# Patient Record
Sex: Female | Born: 1945 | Race: White | Hispanic: No | Marital: Married | Smoking: Former smoker
Health system: Southern US, Community
[De-identification: ages and names within clinical notes are randomized; demographics above are authoritative.]

## PROBLEM LIST (undated history)

## (undated) DIAGNOSIS — R519 Headache, unspecified: Secondary | ICD-10-CM

## (undated) DIAGNOSIS — C801 Malignant (primary) neoplasm, unspecified: Secondary | ICD-10-CM

## (undated) DIAGNOSIS — K219 Gastro-esophageal reflux disease without esophagitis: Secondary | ICD-10-CM

## (undated) DIAGNOSIS — G629 Polyneuropathy, unspecified: Secondary | ICD-10-CM

## (undated) DIAGNOSIS — J45909 Unspecified asthma, uncomplicated: Secondary | ICD-10-CM

## (undated) DIAGNOSIS — G4730 Sleep apnea, unspecified: Secondary | ICD-10-CM

## (undated) DIAGNOSIS — E1140 Type 2 diabetes mellitus with diabetic neuropathy, unspecified: Secondary | ICD-10-CM

## (undated) DIAGNOSIS — M545 Low back pain, unspecified: Secondary | ICD-10-CM

## (undated) DIAGNOSIS — J449 Chronic obstructive pulmonary disease, unspecified: Secondary | ICD-10-CM

## (undated) DIAGNOSIS — R51 Headache: Secondary | ICD-10-CM

## (undated) DIAGNOSIS — I4891 Unspecified atrial fibrillation: Secondary | ICD-10-CM

## (undated) DIAGNOSIS — R0600 Dyspnea, unspecified: Secondary | ICD-10-CM

## (undated) DIAGNOSIS — E119 Type 2 diabetes mellitus without complications: Secondary | ICD-10-CM

## (undated) DIAGNOSIS — J189 Pneumonia, unspecified organism: Secondary | ICD-10-CM

## (undated) DIAGNOSIS — K429 Umbilical hernia without obstruction or gangrene: Secondary | ICD-10-CM

## (undated) DIAGNOSIS — C259 Malignant neoplasm of pancreas, unspecified: Secondary | ICD-10-CM

## (undated) DIAGNOSIS — R569 Unspecified convulsions: Secondary | ICD-10-CM

## (undated) DIAGNOSIS — I10 Essential (primary) hypertension: Secondary | ICD-10-CM

## (undated) DIAGNOSIS — F32A Depression, unspecified: Secondary | ICD-10-CM

## (undated) DIAGNOSIS — E7800 Pure hypercholesterolemia, unspecified: Secondary | ICD-10-CM

## (undated) DIAGNOSIS — G8929 Other chronic pain: Secondary | ICD-10-CM

## (undated) DIAGNOSIS — K8689 Other specified diseases of pancreas: Secondary | ICD-10-CM

## (undated) DIAGNOSIS — F329 Major depressive disorder, single episode, unspecified: Secondary | ICD-10-CM

## (undated) DIAGNOSIS — M1990 Unspecified osteoarthritis, unspecified site: Secondary | ICD-10-CM

## (undated) DIAGNOSIS — M539 Dorsopathy, unspecified: Secondary | ICD-10-CM

## (undated) DIAGNOSIS — F410 Panic disorder [episodic paroxysmal anxiety] without agoraphobia: Secondary | ICD-10-CM

## (undated) DIAGNOSIS — R238 Other skin changes: Secondary | ICD-10-CM

## (undated) DIAGNOSIS — R12 Heartburn: Secondary | ICD-10-CM

## (undated) DIAGNOSIS — E785 Hyperlipidemia, unspecified: Secondary | ICD-10-CM

## (undated) DIAGNOSIS — E669 Obesity, unspecified: Secondary | ICD-10-CM

## (undated) DIAGNOSIS — Z973 Presence of spectacles and contact lenses: Secondary | ICD-10-CM

## (undated) DIAGNOSIS — R609 Edema, unspecified: Secondary | ICD-10-CM

## (undated) DIAGNOSIS — R002 Palpitations: Secondary | ICD-10-CM

## (undated) DIAGNOSIS — F419 Anxiety disorder, unspecified: Secondary | ICD-10-CM

## (undated) DIAGNOSIS — N390 Urinary tract infection, site not specified: Secondary | ICD-10-CM

## (undated) HISTORY — PX: HX GALL BLADDER SURGERY/CHOLE: SHX55

## (undated) HISTORY — PX: HX HERNIA REPAIR: SHX51

## (undated) HISTORY — PX: HX SUBCLAVIAN PORT IMPLANTION: 2100001166

## (undated) HISTORY — PX: HX TONSILLECTOMY: SHX27

## (undated) HISTORY — PX: HX TONSIL AND ADENOIDECTOMY: SHX28

## (undated) HISTORY — PX: UMBILICAL HERNIA REPAIR: SHX196

## (undated) HISTORY — PX: WHIPPLE PROCEDURE W/ LAPAROSCOPY: SHX2667

## (undated) HISTORY — DX: Unspecified asthma, uncomplicated: J45.909

## (undated) HISTORY — DX: Pure hypercholesterolemia, unspecified: E78.00

## (undated) HISTORY — DX: Chronic obstructive pulmonary disease, unspecified: J44.9

## (undated) HISTORY — DX: Umbilical hernia without obstruction or gangrene: K42.9

## (undated) HISTORY — DX: Type 2 diabetes mellitus without complications: E11.9

## (undated) HISTORY — DX: Major depressive disorder, single episode, unspecified: F32.9

## (undated) HISTORY — DX: Unspecified atrial fibrillation: I48.91

## (undated) HISTORY — DX: Gastro-esophageal reflux disease without esophagitis: K21.9

## (undated) HISTORY — DX: Type 2 diabetes mellitus with diabetic neuropathy, unspecified: E11.40

## (undated) HISTORY — DX: Other specified diseases of pancreas: K86.89

## (undated) HISTORY — PX: HERNIA REPAIR: SHX51

## (undated) HISTORY — DX: Essential (primary) hypertension: I10

## (undated) HISTORY — DX: Depression, unspecified: F32.A

## (undated) HISTORY — DX: Malignant neoplasm of pancreas, unspecified: C25.9

---
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
## 1998-12-12 DIAGNOSIS — E119 Type 2 diabetes mellitus without complications: Secondary | ICD-10-CM

## 1998-12-12 HISTORY — DX: Type 2 diabetes mellitus without complications (CMS HCC): E11.9

## 2008-08-01 ENCOUNTER — Ambulatory Visit: Admission: RE | Admit: 2008-08-01 | Discharge: 2008-08-01 | Payer: Self-pay | Admitting: Neurology

## 2011-04-26 NOTE — Procedures (Signed)
Renee Jackson, LOSS            ACCOUNT NO.:  [id number]   MEDICAL RECORD NO.:  [id number]          PATIENT TYPE:  OUT   LOCATION:  SLEE                          FACILITY:  [facility]   PHYSICIAN:  Kofi A. Gerilyn Pilgrim, M.D. DATE OF BIRTH:  01-11-1946   DATE OF PROCEDURE:  08/01/2008  DATE OF DISCHARGE:  08/01/2008                             SLEEP DISORDER REPORT   NOCTURNAL POLYSOMNOGRAPHY REPORT   REFERRING PHYSICIAN:  Kofi A. [place]on[place]ah, MD.   HISTORY:  This is a 65 year old lady who presents with hypersomnia and  snoring, and has been evaluated for obstructive sleep apnea syndrome.   MEDICATIONS:  Metoprolol, Singulair, omeprazole, folic acid, and  metformin.   Epworth sleepiness scale is 15.  BMI 47.   SLEEP STAGE SUMMARY:  The total recording time is 437 minutes.  Sleep  efficiency 93%.  Sleep latency 6.5 minutes.  REM latency 374 minutes.  Stage N1 4.2%, N2 46.6%, N3 45.6%, and REM sleep 3.7%.   RESPIRATORY SUMMARY:  Baseline oxygen saturation 95%.  Lowest saturation  67% during the REM sleep.  AHI is 13.3 with more events occurring during  the REM sleep.  The REM AHI is 78.6.   LIMB MOVEMENT SUMMARY:  PLM index is 15.7.   ELECTROCARDIOGRAM SUMMARY:  Average heart rate is 83.  The patient had  frequent PVCs.   IMPRESSION:  1. Mild obstructive sleep apnea syndrome, worse during the REM sleep      with severe desaturations.  2. 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      Kofi A. Gerilyn Pilgrim, M.D.  Electronically Signed     KAD/MEDQ  D:  08/05/2008  T:  08/05/2008  Job:  [id number]

## 2016-08-05 ENCOUNTER — Other Ambulatory Visit: Payer: Self-pay

## 2016-12-12 DIAGNOSIS — Z923 Personal history of irradiation: Secondary | ICD-10-CM

## 2016-12-12 DIAGNOSIS — Z9221 Personal history of antineoplastic chemotherapy: Secondary | ICD-10-CM

## 2016-12-12 HISTORY — PX: PANCREATICODUODENECTOMY: SUR1000

## 2016-12-12 HISTORY — DX: Personal history of irradiation: Z92.3

## 2016-12-12 HISTORY — DX: Personal history of antineoplastic chemotherapy: Z92.21

## 2017-01-24 DIAGNOSIS — J449 Chronic obstructive pulmonary disease, unspecified: Secondary | ICD-10-CM | POA: Insufficient documentation

## 2017-01-24 DIAGNOSIS — F329 Major depressive disorder, single episode, unspecified: Secondary | ICD-10-CM | POA: Insufficient documentation

## 2017-01-24 DIAGNOSIS — E7800 Pure hypercholesterolemia, unspecified: Secondary | ICD-10-CM | POA: Insufficient documentation

## 2017-01-24 DIAGNOSIS — J45909 Unspecified asthma, uncomplicated: Secondary | ICD-10-CM | POA: Insufficient documentation

## 2017-01-24 DIAGNOSIS — I10 Essential (primary) hypertension: Secondary | ICD-10-CM | POA: Insufficient documentation

## 2017-01-24 DIAGNOSIS — E119 Type 2 diabetes mellitus without complications: Secondary | ICD-10-CM | POA: Insufficient documentation

## 2017-01-24 DIAGNOSIS — F32A Depression, unspecified: Secondary | ICD-10-CM | POA: Insufficient documentation

## 2017-01-24 DIAGNOSIS — Z794 Long term (current) use of insulin: Secondary | ICD-10-CM | POA: Insufficient documentation

## 2017-03-12 DIAGNOSIS — C259 Malignant neoplasm of pancreas, unspecified: Secondary | ICD-10-CM

## 2017-03-12 HISTORY — DX: Malignant neoplasm of pancreas, unspecified (CMS HCC): C25.9

## 2017-03-21 ENCOUNTER — Encounter (HOSPITAL_COMMUNITY): Payer: Self-pay | Admitting: Oncology

## 2017-03-21 ENCOUNTER — Encounter (HOSPITAL_COMMUNITY): Payer: Medicare Other

## 2017-03-21 ENCOUNTER — Encounter (HOSPITAL_COMMUNITY): Payer: Medicare Other | Attending: Oncology | Admitting: Oncology

## 2017-03-21 VITALS — BP 173/48 | HR 74 | Temp 98.6°F | Resp 20 | Ht 62.0 in | Wt 218.7 lb

## 2017-03-21 DIAGNOSIS — I10 Essential (primary) hypertension: Secondary | ICD-10-CM

## 2017-03-21 DIAGNOSIS — R1013 Epigastric pain: Secondary | ICD-10-CM

## 2017-03-21 DIAGNOSIS — R634 Abnormal weight loss: Secondary | ICD-10-CM

## 2017-03-21 DIAGNOSIS — E119 Type 2 diabetes mellitus without complications: Secondary | ICD-10-CM

## 2017-03-21 DIAGNOSIS — K8689 Other specified diseases of pancreas: Secondary | ICD-10-CM

## 2017-03-21 DIAGNOSIS — Z87891 Personal history of nicotine dependence: Secondary | ICD-10-CM | POA: Diagnosis not present

## 2017-03-21 DIAGNOSIS — K869 Disease of pancreas, unspecified: Secondary | ICD-10-CM | POA: Diagnosis present

## 2017-03-21 HISTORY — DX: Other specified diseases of pancreas: K86.89

## 2017-03-21 LAB — CBC WITH DIFFERENTIAL/PLATELET
BASOS PCT: 1 %
Basophils Absolute: 0.1 10*3/uL (ref 0.0–0.1)
EOS ABS: 0.2 10*3/uL (ref 0.0–0.7)
EOS PCT: 2 %
HCT: 36.7 % (ref 36.0–46.0)
HEMOGLOBIN: 12.4 g/dL (ref 12.0–15.0)
Lymphocytes Relative: 27 %
Lymphs Abs: 3.1 10*3/uL (ref 0.7–4.0)
MCH: 28.3 pg (ref 26.0–34.0)
MCHC: 33.8 g/dL (ref 30.0–36.0)
MCV: 83.8 fL (ref 78.0–100.0)
Monocytes Absolute: 0.6 10*3/uL (ref 0.1–1.0)
Monocytes Relative: 5 %
NEUTROS PCT: 65 %
Neutro Abs: 7.7 10*3/uL (ref 1.7–7.7)
PLATELETS: 249 10*3/uL (ref 150–400)
RBC: 4.38 MIL/uL (ref 3.87–5.11)
RDW: 12.9 % (ref 11.5–15.5)
WBC: 11.7 10*3/uL — AB (ref 4.0–10.5)

## 2017-03-21 LAB — COMPREHENSIVE METABOLIC PANEL
ALBUMIN: 4.1 g/dL (ref 3.5–5.0)
ALK PHOS: 69 U/L (ref 38–126)
ALT: 14 U/L (ref 14–54)
ANION GAP: 11 (ref 5–15)
AST: 17 U/L (ref 15–41)
BUN: 12 mg/dL (ref 6–20)
CALCIUM: 9.3 mg/dL (ref 8.9–10.3)
CHLORIDE: 93 mmol/L — AB (ref 101–111)
CO2: 28 mmol/L (ref 22–32)
CREATININE: 0.7 mg/dL (ref 0.44–1.00)
GFR calc Af Amer: >60 mL/min (ref >60)
GFR calc non Af Amer: >60 mL/min (ref >60)
Glucose, Bld: 183 mg/dL — ABNORMAL HIGH (ref 65–99)
Potassium: 4.6 mmol/L (ref 3.5–5.1)
SODIUM: 132 mmol/L — AB (ref 135–145)
Total Bilirubin: 0.7 mg/dL (ref 0.3–1.2)
Total Protein: 6.7 g/dL (ref 6.5–8.1)

## 2017-03-21 NOTE — Assessment & Plan Note (Addendum)
Pancreatic mass measuring .9 x 2.1 x 3.5 cm abutting the edge of the portal vein and SMV without obvious invasion or wrapping around the structures.  I personally reviewed and went over laboratory results with the patient.  The results are noted within this dictation.  Labs from Dr. Gypsy Balsam office are reviewed.  I personally reviewed and went over radiographic studies with the patient.  The results are noted within this dictation.  MR abdomen from [facility] is reviewed.  MRI of the abdomen demonstrated a hypoenhancing mass in the pancreatic body and head measuring 3.9 x 2.1 x 3.5 cm extending to the posterior medial pancreatic margin and abutting the edge of the portal vein and SMV without obvious invasion or wrapping around of the structures.  The common bile duct skirts the right lateral margin of the mass is not definitely encased by the mass.  There is no common bile duct dilatation.  No definite venous invasion of the mass nor wrapping of the venous structures by the mass identified.  We discussed the and suspicion for malignancy possible implication of her CT imaging, namely pancreatic cancer.  She is advised that without a biopsy a diagnosis cannot be made.  We discussed options for diagnosis.  She has been advised that and endoscopic ultrasound is necessary.  She is agreeable to this procedure.  We will refer the patient to Dr. Joylene Grapes. Paulita Fujita for EUS.  I have messaged Dr. Ardis Hughs about facilitating an appointment with him for an EUS with biopsy.  He has requested a way to review imaging results.  MR was performed at [facility].  I have called radiology and they confirm that the images are viewable via PACS.  I have asked him if he has access to PACS.  If not, I will work on getting him a copy of the disc.  Order is placed for PET imaging to complete staging.  Labs today: CBC diff, CMET, CA 19-9.  Based upon PET scan and EUS result, the patient may be a surgical candidate.  If so, we  will refer the patient to Dr. Eugenia Pancoast at [facility] for surgical oncology consultation.  Problem list reviewed with patient and edited accordingly.  Medications are reviewed with the patient and edited accordingly.  Patient will return in 2-3 weeks for follow-up after all testing above has been completed for further medical oncology recommendations.  More than 50% of the time spent with the patient was utilized for counseling and coordination of care.

## 2017-03-21 NOTE — Progress Notes (Signed)
Met with pt face to face.  Introduced myself and explain a little bit about my role as the patient navigator.  Pt given a card with all my information on it.  Told pt to call if they had any questions or concerns.  Pt verbalized understanding.

## 2017-03-21 NOTE — Patient Instructions (Signed)
[place] at [facility] Discharge Instructions  RECOMMENDATIONS MADE BY THE CONSULTANT AND ANY TEST RESULTS WILL BE SENT TO YOUR REFERRING PHYSICIAN.  We will get lab work today.  We reviewed your scans and there is a pancreatic mass.  This is concerning for cancer, but we need a biopsy to see what it really is.   We will get a PET scan to confirm that nothing else is abnormal within your body.  This will be done at [facility] in [place]. We will refer you to Dr. Ardis Hughs or Dr. Paulita Fujita for an endoscopic ultrasound with biopsy to diagnose this pancreatic mass.  We will have you return after your biopsy for further recommendations. Take you pain medication at home.  You can take 1/2 a tablet if needed.  Take pain medication with food.  Take your nausea medication too. We recommend small, frequent meals to help with your abdominal pain. Please call with any questions or concerns.    Thank you for choosing [place] at [facility] to provide your oncology and hematology care.  To afford each patient quality time with our provider, please arrive at least 15 minutes before your scheduled appointment time.    If you have a lab appointment with the [facility] please come in thru the  Main Entrance and check in at the main information desk  You need to re-schedule your appointment should you arrive 10 or more minutes late.  We strive to give you quality time with our providers, and arriving late affects you and other patients whose appointments are after yours.  Also, if you no show three or more times for appointments you may be dismissed from the clinic at the providers discretion.     Again, thank you for choosing [facility].  Our hope is that these requests will decrease the amount of time that you wait before being seen by our physicians.       _____________________________________________________________  Should you have  questions after your visit to [facility], please contact our office at [phone] between the hours of 8:30 a.m. and 4:30 p.m.  Voicemails left after 4:30 p.m. will not be returned until the following business day.  For prescription refill requests, have your pharmacy contact our office.       Resources For Cancer Patients and their Caregivers ? American Cancer Society: Can assist with transportation, wigs, general needs, runs Look Good Feel Better.        410-707-2337 ? Cancer Care: Provides financial assistance, online support groups, medication/co-pay assistance.  1-800-813-HOPE 971-506-6697) ? [place] Assists [place] cancer patients and their families through emotional , educational and financial support.  540-453-2388 ? [place] DSS Where to apply for food stamps, Medicaid and utility assistance. (279)255-9161 ? [facility]: Transportation to medical appointments. 5087549276 ? Social Security Administration: May apply for disability if have a Stage IV cancer. 405-701-8178 731-498-8740 ? [employer], Disability and Transit Services: Assists with nutrition, care and transit needs. [place] Support Programs: @10RELATIVEDAYS @ > Cancer Support Group  2nd Tuesday of the month 1pm-2pm, Journey Room  > Creative Journey  3rd Tuesday of the month 1130am-1pm, Journey Room  > Look Good Feel Better  1st Wednesday of the month 10am-12 noon, Journey Room (Call [place] to register [phone])

## 2017-03-21 NOTE — Progress Notes (Signed)
[facility] Hematology/Oncology Consultation   Name: TRISHNA CWIK      MRN: [id number]    Location: Room/bed info not found  Date: 03/21/2017 Time:6:12 PM   REFERRING PHYSICIAN:  Gar Ponto, MD (Primary Care Provider)  REASON FOR CONSULT:  "Pancreatic Cancer"   DIAGNOSIS: Dorsal pancreatic duct dilatation and pancreatic atrophy with a hypoenhancing mass in the pancreatic body and head measuring 3.9 x 2.1 x 3.5 cm abutting the edge of the portal vein and SMV without obvious invasion or wrapping around the structures.  HISTORY OF PRESENT ILLNESS:   JAHNA LIEBERT is a 71 y.o. female with a medical history significant for hypertension, GERD, seasonal allergies, depression, diabetes mellitus, emphysema/COPD/asthma who is referred to the [facility] for "pancreatic cancer."  Patient reports that she presented to her primary care physician with epigastric discomfort.  It was presumed that she had issues with her gallbladder.  She was treated symptomatically with dietary restrictions.  Her epigastric pain continued and symptoms progressed to include nausea and vomiting with all foods.  This led her to the emergency department in April 2018.  In the emergency department, ultrasound was performed which led to further imaging including MR abdomen.  MRI of the abdomen demonstrated a hypoenhancing mass in the pancreatic body and head measuring 3.9 x 2.1 x 3.5 cm extending to the posterior medial pancreatic margin and abutting the edge of the portal vein and SMV without obvious invasion or wrapping around of the structures.  The common bile duct skirts the right lateral margin of the mass is not definitely encased by the mass.  There is no common bile duct dilatation.  No definite venous invasion of the mass nor wrapping of the venous structures by the mass identified.  She was subsequently referred to the [facility] for further evaluation and  workup.  The patient reports abdominal bloating.  She admits to a decrease in appetite in addition to increase in epigastric pain with eating.  She reports a 70 pound weight loss over 2 years that was unintentional.  She is unable to quantify her weight loss over the past 3-6 months.  In an attempt to combat her epigastric pain with eating, she has been eating small meals.  Additionally, she has pain medication that has been helpful.  The patient denies any episodes of jaundice.  Her daughter, who accompanies her, reports that the patient was sick in January 2018 and at that time she did appear a little jaundiced to her.  Additionally, her daughter reports multiple antibiotics over the last 4 months.  Review of Systems  Constitutional: Positive for weight loss. Negative for chills and fever.  HENT: Negative.   Respiratory: Negative.  Negative for cough and shortness of breath.   Cardiovascular: Positive for leg swelling. Negative for chest pain.  Gastrointestinal: Positive for abdominal pain, nausea and vomiting. Negative for blood in stool, constipation, diarrhea and melena.  Genitourinary: Negative.   Musculoskeletal: Negative.   Skin: Negative.   Neurological: Negative.  Negative for weakness.  Endo/Heme/Allergies: Negative.   Psychiatric/Behavioral: Negative.     PAST MEDICAL HISTORY:   Past Medical History:  Diagnosis Date  . Asthma   . Atrial fibrillation ([place])   . COPD (chronic obstructive pulmonary disease) ([place])   . Depression   . Diabetes ([place])   . Diabetic neuropathy ([facility])    feet and legs  . GERD (gastroesophageal reflux disease)   .  Hypercholesteremia   . Hypertension   . Pancreatic mass 03/21/2017  . Umbilical hernia     ALLERGIES: Allergies  Allergen Reactions  . Sulfa Antibiotics       MEDICATIONS: I have reviewed the patient's current medications.    No current outpatient prescriptions on file prior to visit.   No current facility-administered  medications on file prior to visit.      PAST SURGICAL HISTORY Past Surgical History:  Procedure Laterality Date  . UMBILICAL HERNIA REPAIR     multiple times    FAMILY HISTORY: Family History  Problem Relation Age of Onset  . Heart disease Mother   . Dementia Father    Mother deceased at the age of 48 secondary to complications of heart disease and diabetes. Father deceased at the age of 61 secondary to complications of dementia. She has 5 brothers, 4 of which are deceased. She has 3 sisters. She has a daughter 40 years old with multiple medical issues including intracranial hypertension, neuropathy, back issues, and morbid obesity. She has 90 son is 12 years old in good health.  SOCIAL HISTORY:  reports that she quit smoking about 22 years ago. Her smoking use included Cigarettes. She has a 20.00 pack-year smoking history. She has never used smokeless tobacco. She reports that she drinks alcohol. She reports that she does not use drugs.  She denies any illicit drug abuse.  She rarely drinks alcohol.  She does have a 1 pack per day 20 year smoking history quitting 22 years ago.  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  She is Psychologist, forensic and religion.  She is widowed.  Her husband passed away in 2016-11-13 secondary to stage IV pancreatic cancer.  He was not offered chemotherapy given his extent of disease.  Social History   Social History  . Marital status: Married    Spouse name: N/A  . Number of children: N/A  . Years of education: N/A   Social History Main Topics  . Smoking status: Former Smoker    Packs/day: 1.00    Years: 20.00    Types: Cigarettes    Quit date: 03/22/1995  . Smokeless tobacco: Never Used  . Alcohol use Yes     Comment: rare  . Drug use: No  . Sexual activity: No   Other Topics Concern  . None   Social History Narrative  . None    PERFORMANCE STATUS: The patient's performance  status is 1 - Symptomatic but completely ambulatory  PHYSICAL EXAM: Most Recent Vital Signs: Blood pressure (!) 173/48, pulse 74, temperature 98.6 F (37 C), temperature source Oral, resp. rate 20, height 5\' 2"  (1.575 m), weight 218 lb 11.2 oz (99.2 kg), SpO2 98 %. BP (!) 173/48 (BP Location: Right Arm, Patient Position: Sitting)   Pulse 74   Temp 98.6 F (37 C) (Oral)   Resp 20   Ht 5\' 2"  (1.575 m)   Wt 218 lb 11.2 oz (99.2 kg)   SpO2 98%   BMI 40.00 kg/m   General Appearance:    Alert, cooperative, no distress, appears stated age, accompanied by her daughter Vita Barley.  Head:    Normocephalic, without obvious abnormality, atraumatic  Eyes:    Conjunctiva/corneas clear, EOM's intact, both eyes  Ears:    Normal TM's and external ear canals, both ears  Nose:   Nares normal, septum midline, mucosa normal, no drainage  or sinus tenderness  Throat:   Lips, mucosa, and tongue normal; teeth and gums normal.  Dentition is fair.  Neck:   Supple, symmetrical, trachea midline, no adenopathy;    thyroid:  no enlargement/tenderness/nodules; no carotid   bruit or JVD  Back:     Symmetric, no curvature, ROM normal, no CVA tenderness  Lungs:     Clear to auscultation bilaterally, respirations unlabored  Chest Wall:    No tenderness or deformity   Heart:    Regular rate and rhythm, S1 and S2 normal, no murmur, rub   or gallop  Breast Exam:    Not examined  Abdomen:     Soft, non-tender, bowel sounds active all four quadrants,    no masses, no organomegaly  Genitalia:    Not examined  Rectal:    Not examined  Extremities:   B/L LE edema, 1+ pitting, no cyanosis or edema  Pulses:   2+ and symmetric all extremities  Skin:   Skin color, texture, turgor normal, no rashes or lesions  Lymph nodes:   Cervical, supraclavicular, and axillary nodes normal  Neurologic:   CNII-XII intact, normal strength, sensation and reflexes    throughout    LABORATORY DATA:  CBC    Component Value Date/Time   WBC  11.7 (H) 03/21/2017 1311   RBC 4.38 03/21/2017 1311   HGB 12.4 03/21/2017 1311   HCT 36.7 03/21/2017 1311   PLT 249 03/21/2017 1311   MCV 83.8 03/21/2017 1311   MCH 28.3 03/21/2017 1311   MCHC 33.8 03/21/2017 1311   RDW 12.9 03/21/2017 1311   LYMPHSABS 3.1 03/21/2017 1311   MONOABS 0.6 03/21/2017 1311   EOSABS 0.2 03/21/2017 1311   BASOSABS 0.1 03/21/2017 1311     Chemistry      Component Value Date/Time   NA 132 (L) 03/21/2017 1311   K 4.6 03/21/2017 1311   CL 93 (L) 03/21/2017 1311   CO2 28 03/21/2017 1311   BUN 12 03/21/2017 1311   CREATININE 0.70 03/21/2017 1311      Component Value Date/Time   CALCIUM 9.3 03/21/2017 1311   ALKPHOS 69 03/21/2017 1311   AST 17 03/21/2017 1311   ALT 14 03/21/2017 1311   BILITOT 0.7 03/21/2017 1311                 RADIOGRAPHY:          PATHOLOGY:  Not available  ASSESSMENT/PLAN:   Pancreatic mass Pancreatic mass measuring .9 x 2.1 x 3.5 cm abutting the edge of the portal vein and SMV without obvious invasion or wrapping around the structures.  I personally reviewed and went over laboratory results with the patient.  The results are noted within this dictation.  Labs from Dr. Gypsy Balsam office are reviewed.  I personally reviewed and went over radiographic studies with the patient.  The results are noted within this dictation.  MR abdomen from [facility] is reviewed.  MRI of the abdomen demonstrated a hypoenhancing mass in the pancreatic body and head measuring 3.9 x 2.1 x 3.5 cm extending to the posterior medial pancreatic margin and abutting the edge of the portal vein and SMV without obvious invasion or wrapping around of the structures.  The common bile duct skirts the right lateral margin of the mass is not definitely encased by the mass.  There is no common bile duct dilatation.  No definite venous invasion of the mass nor wrapping of the venous structures by the mass identified.  We discussed the and suspicion  for malignancy possible implication of her CT imaging, namely pancreatic cancer.  She is advised that without a biopsy a diagnosis cannot be made.  We discussed options for diagnosis.  She has been advised that and endoscopic ultrasound is necessary.  She is agreeable to this procedure.  We will refer the patient to Dr. Joylene Grapes. Paulita Fujita for EUS.  I have messaged Dr. Ardis Hughs about facilitating an appointment with him for an EUS with biopsy.  He has requested a way to review imaging results.  MR was performed at [facility].  I have called radiology and they confirm that the images are viewable via PACS.  I have asked him if he has access to PACS.  If not, I will work on getting him a copy of the disc.  Order is placed for PET imaging to complete staging.  Labs today: CBC diff, CMET, CA 19-9.  Based upon PET scan and EUS result, the patient may be a surgical candidate.  If so, we will refer the [facility].  Problem list reviewed with patient and edited accordingly.  Medications are reviewed with the patient and edited accordingly.  Patient will return in 2-3 weeks for follow-up after all testing above has been completed for further medical oncology recommendations.  More than 50% of the time spent with the patient was utilized for counseling and coordination of care.   ORDERS PLACED FOR THIS ENCOUNTER: Orders Placed This Encounter  Procedures  . NM PET Image Initial (PI) Skull Base To Thigh  . CBC with Differential  . Comprehensive metabolic panel  . Cancer antigen 19-9  . Ambulatory referral to Social Work  . Ambulatory referral to Social Work    MEDICATIONS PRESCRIBED THIS ENCOUNTER: Meds ordered this encounter  Medications  . atorvastatin (LIPITOR) 10 MG tablet  . citalopram (CELEXA) 20 MG tablet  . glipiZIDE (GLUCOTROL XL) 10 MG 24 hr tablet  . losartan (COZAAR) 100 MG tablet  . metFORMIN (GLUCOPHAGE) 1000 MG tablet  .  metoprolol succinate (TOPROL-XL) 50 MG 24 hr tablet  . montelukast (SINGULAIR) 10 MG tablet  . omeprazole (PRILOSEC) 20 MG capsule  . promethazine (PHENERGAN) 25 MG tablet  . HYDROcodone-acetaminophen (NORCO/VICODIN) 5-325 MG tablet  . hydrochlorothiazide (HYDRODIURIL) 25 MG tablet  . aspirin EC 81 MG tablet    Sig: Take 81 mg by mouth daily.    All questions were answered. The patient knows to call the clinic with any problems, questions or concerns. We can certainly see the patient much sooner if necessary.  Patient discussed with Dr. Talbert Cage and together we ascertained an up-to-date interval history, and examined the patient.  Dr. Talbert Cage developed the patient's assessment and plan.  This was a shared visit-consultation.  Her attestation will follow below.  This note is electronically signed by: Doy Mince 03/21/2017 6:12 PM   ATTENDING ATTESTATION: Patient seen and examined with Robynn Pane, PA. Agree with assessment and plan as outlined above.   Twana First, MD Medical Oncology

## 2017-03-22 ENCOUNTER — Other Ambulatory Visit: Payer: Self-pay

## 2017-03-22 ENCOUNTER — Telehealth: Payer: Self-pay

## 2017-03-22 DIAGNOSIS — K8689 Other specified diseases of pancreas: Secondary | ICD-10-CM

## 2017-03-22 LAB — CANCER ANTIGEN 19-9: CA 19-9: 301 U/mL — ABNORMAL HIGH (ref 0–35)

## 2017-03-22 NOTE — Telephone Encounter (Signed)
EUS scheduled, pt instructed and medications reviewed.  Patient instructions mailed to home.  Patient to call with any questions or concerns.  

## 2017-03-22 NOTE — Telephone Encounter (Signed)
-----   Message from Milus Banister, MD sent at 03/22/2017  9:04 AM EDT ----- Thanks, yes I was able to see the images. We'll contact her about EUS, FNA.  Looking at next Thursday.   [place] Barg, She needs upper EUS radial +/- linear next Thursday (the 19th) at 7:30AM  Please bump the currently scheduled 7;30 colonoscopy patient (Mr. Kalman Shan I think) to the following Thursday(26th) and apologize for the change in his schedule.  Will have to adjust the date that he stops his blood thinner appropriately.  Thanks  DJ  ----- Message ----- From: Baird Cancer, PA-C Sent: 03/21/2017   4:27 PM To: Milus Banister, MD  Do you have access the PACS to review imaging?  It was done at [facility]) and is viewable in PACS.  If not, let me know and I will see what I can do.  Thanks  TK   ----- Message ----- From: Milus Banister, MD Sent: 03/21/2017   1:20 PM To: Baird Cancer, PA-C  I can probably help but I'd like to see the films myself before committing. They aren't visible in via EPIC.  Can you have the [place] of the CT scan sent to my office?    Thanks  ----- Message ----- From: Baird Cancer, PA-C Sent: 03/21/2017   1:06 PM To: Milus Banister, MD  Good afternoon,  I just saw this new patient.  Long story short, she had imaging performed at [facility] due to ongoing epigastric pain demonstrating a 3.9 cm pancreatic mass.   No evidence of metastatic disease.  I was hoping you can help with getting her in for an EUS.  We will send an "official" referral.  Thanks  Kirby Crigler, PA-C

## 2017-03-22 NOTE — Telephone Encounter (Signed)
Left message on machine to call back  

## 2017-03-27 ENCOUNTER — Encounter (HOSPITAL_COMMUNITY): Payer: Self-pay | Admitting: *Deleted

## 2017-03-27 ENCOUNTER — Ambulatory Visit (HOSPITAL_COMMUNITY)
Admission: RE | Admit: 2017-03-27 | Discharge: 2017-03-27 | Disposition: A | Discharge status: Home / Self Care | Payer: Medicare Other | Source: Ambulatory Visit | Attending: Oncology | Admitting: Oncology

## 2017-03-27 DIAGNOSIS — K869 Disease of pancreas, unspecified: Secondary | ICD-10-CM | POA: Insufficient documentation

## 2017-03-27 DIAGNOSIS — I2510 Atherosclerotic heart disease of native coronary artery without angina pectoris: Secondary | ICD-10-CM | POA: Insufficient documentation

## 2017-03-27 DIAGNOSIS — K8020 Calculus of gallbladder without cholecystitis without obstruction: Secondary | ICD-10-CM | POA: Insufficient documentation

## 2017-03-27 DIAGNOSIS — I700 Atherosclerosis of aorta: Secondary | ICD-10-CM | POA: Insufficient documentation

## 2017-03-27 DIAGNOSIS — K449 Diaphragmatic hernia without obstruction or gangrene: Secondary | ICD-10-CM | POA: Insufficient documentation

## 2017-03-27 DIAGNOSIS — K8689 Other specified diseases of pancreas: Secondary | ICD-10-CM

## 2017-03-27 LAB — GLUCOSE, CAPILLARY: Glucose-Capillary: 218 mg/dL — ABNORMAL HIGH (ref 65–99)

## 2017-03-27 MED ORDER — FLUDEOXYGLUCOSE F - 18 (FDG) INJECTION
11.1800 | Freq: Once | INTRAVENOUS | Status: AC | PRN
  Administered 2017-03-27: 11.18 via INTRAVENOUS

## 2017-03-30 ENCOUNTER — Ambulatory Visit (HOSPITAL_COMMUNITY)
Admission: RE | Admit: 2017-03-30 | Discharge: 2017-03-30 | Disposition: A | Discharge status: Home / Self Care | Payer: Medicare Other | Source: Ambulatory Visit | Attending: Gastroenterology | Admitting: Gastroenterology

## 2017-03-30 ENCOUNTER — Ambulatory Visit (HOSPITAL_COMMUNITY): Payer: Medicare Other | Admitting: Anesthesiology

## 2017-03-30 ENCOUNTER — Encounter (HOSPITAL_COMMUNITY)
Admission: RE | Disposition: A | Discharge status: Home / Self Care | Payer: Self-pay | Source: Ambulatory Visit | Attending: Gastroenterology

## 2017-03-30 ENCOUNTER — Encounter (HOSPITAL_COMMUNITY): Payer: Self-pay

## 2017-03-30 DIAGNOSIS — C259 Malignant neoplasm of pancreas, unspecified: Secondary | ICD-10-CM | POA: Diagnosis present

## 2017-03-30 DIAGNOSIS — G4730 Sleep apnea, unspecified: Secondary | ICD-10-CM | POA: Diagnosis not present

## 2017-03-30 DIAGNOSIS — E1140 Type 2 diabetes mellitus with diabetic neuropathy, unspecified: Secondary | ICD-10-CM | POA: Diagnosis not present

## 2017-03-30 DIAGNOSIS — J439 Emphysema, unspecified: Secondary | ICD-10-CM | POA: Diagnosis not present

## 2017-03-30 DIAGNOSIS — Z833 Family history of diabetes mellitus: Secondary | ICD-10-CM | POA: Insufficient documentation

## 2017-03-30 DIAGNOSIS — Z7982 Long term (current) use of aspirin: Secondary | ICD-10-CM | POA: Insufficient documentation

## 2017-03-30 DIAGNOSIS — I10 Essential (primary) hypertension: Secondary | ICD-10-CM | POA: Diagnosis not present

## 2017-03-30 DIAGNOSIS — Z79899 Other long term (current) drug therapy: Secondary | ICD-10-CM | POA: Diagnosis not present

## 2017-03-30 DIAGNOSIS — F329 Major depressive disorder, single episode, unspecified: Secondary | ICD-10-CM | POA: Insufficient documentation

## 2017-03-30 DIAGNOSIS — Z882 Allergy status to sulfonamides status: Secondary | ICD-10-CM | POA: Diagnosis not present

## 2017-03-30 DIAGNOSIS — Z7984 Long term (current) use of oral hypoglycemic drugs: Secondary | ICD-10-CM | POA: Insufficient documentation

## 2017-03-30 DIAGNOSIS — K219 Gastro-esophageal reflux disease without esophagitis: Secondary | ICD-10-CM | POA: Insufficient documentation

## 2017-03-30 DIAGNOSIS — Z87891 Personal history of nicotine dependence: Secondary | ICD-10-CM | POA: Diagnosis not present

## 2017-03-30 DIAGNOSIS — C257 Malignant neoplasm of other parts of pancreas: Secondary | ICD-10-CM | POA: Insufficient documentation

## 2017-03-30 DIAGNOSIS — K869 Disease of pancreas, unspecified: Secondary | ICD-10-CM | POA: Diagnosis not present

## 2017-03-30 DIAGNOSIS — R935 Abnormal findings on diagnostic imaging of other abdominal regions, including retroperitoneum: Secondary | ICD-10-CM

## 2017-03-30 DIAGNOSIS — Z6839 Body mass index (BMI) 39.0-39.9, adult: Secondary | ICD-10-CM | POA: Diagnosis not present

## 2017-03-30 DIAGNOSIS — E7800 Pure hypercholesterolemia, unspecified: Secondary | ICD-10-CM | POA: Insufficient documentation

## 2017-03-30 DIAGNOSIS — K8689 Other specified diseases of pancreas: Secondary | ICD-10-CM

## 2017-03-30 HISTORY — DX: Sleep apnea, unspecified: G47.30

## 2017-03-30 HISTORY — DX: Headache, unspecified: R51.9

## 2017-03-30 HISTORY — DX: Low back pain: M54.5

## 2017-03-30 HISTORY — DX: Unspecified osteoarthritis, unspecified site: M19.90

## 2017-03-30 HISTORY — DX: Pneumonia, unspecified organism: J18.9

## 2017-03-30 HISTORY — DX: Other chronic pain: G89.29

## 2017-03-30 HISTORY — DX: Polyneuropathy, unspecified: G62.9

## 2017-03-30 HISTORY — DX: Headache: R51

## 2017-03-30 HISTORY — DX: Low back pain, unspecified: M54.50

## 2017-03-30 HISTORY — PX: EUS: SHX5427

## 2017-03-30 HISTORY — DX: Unspecified convulsions: R56.9

## 2017-03-30 LAB — GLUCOSE, CAPILLARY: GLUCOSE-CAPILLARY: 223 mg/dL — AB (ref 65–99)

## 2017-03-30 SURGERY — UPPER ENDOSCOPIC ULTRASOUND (EUS) LINEAR
Anesthesia: Monitor Anesthesia Care

## 2017-03-30 MED ORDER — ONDANSETRON HCL 4 MG/2ML IJ SOLN
INTRAMUSCULAR | Status: AC
  Filled 2017-03-30: qty 10

## 2017-03-30 MED ORDER — PROPOFOL 10 MG/ML IV BOLUS
INTRAVENOUS | Status: AC
  Filled 2017-03-30: qty 20

## 2017-03-30 MED ORDER — PROPOFOL 10 MG/ML IV BOLUS
INTRAVENOUS | Status: AC
  Filled 2017-03-30: qty 40

## 2017-03-30 MED ORDER — HYDRALAZINE HCL 20 MG/ML IJ SOLN
INTRAMUSCULAR | Status: AC
  Filled 2017-03-30: qty 1

## 2017-03-30 MED ORDER — LACTATED RINGERS IV SOLN
INTRAVENOUS | Status: DC
  Administered 2017-03-30: 08:00:00 via INTRAVENOUS

## 2017-03-30 MED ORDER — SODIUM CHLORIDE 0.9 % IV SOLN: INTRAVENOUS | Status: DC

## 2017-03-30 MED ORDER — LIDOCAINE 2% (20 MG/ML) 5 ML SYRINGE
INTRAMUSCULAR | Status: DC | PRN
  Administered 2017-03-30: 40 mg via INTRAVENOUS

## 2017-03-30 MED ORDER — ROCURONIUM BROMIDE 50 MG/5ML IV SOSY
PREFILLED_SYRINGE | INTRAVENOUS | Status: AC
  Filled 2017-03-30: qty 10

## 2017-03-30 MED ORDER — SODIUM CHLORIDE 0.9 % IJ SOLN
INTRAMUSCULAR | Status: AC
  Filled 2017-03-30: qty 10

## 2017-03-30 MED ORDER — PHENYLEPHRINE 40 MCG/ML (10ML) SYRINGE FOR IV PUSH (FOR BLOOD PRESSURE SUPPORT)
PREFILLED_SYRINGE | INTRAVENOUS | Status: AC
  Filled 2017-03-30: qty 10

## 2017-03-30 MED ORDER — MIDAZOLAM HCL 2 MG/2ML IJ SOLN
INTRAMUSCULAR | Status: AC
  Filled 2017-03-30: qty 2

## 2017-03-30 MED ORDER — LIDOCAINE 2% (20 MG/ML) 5 ML SYRINGE
INTRAMUSCULAR | Status: AC
  Filled 2017-03-30: qty 25

## 2017-03-30 MED ORDER — SUCCINYLCHOLINE CHLORIDE 200 MG/10ML IV SOSY
PREFILLED_SYRINGE | INTRAVENOUS | Status: AC
  Filled 2017-03-30: qty 20

## 2017-03-30 MED ORDER — PROPOFOL 10 MG/ML IV BOLUS
INTRAVENOUS | Status: DC | PRN
  Administered 2017-03-30 (×12): 40 mg via INTRAVENOUS

## 2017-03-30 MED ORDER — HYDRALAZINE HCL 20 MG/ML IJ SOLN
INTRAMUSCULAR | Status: DC | PRN
  Administered 2017-03-30 (×2): 2.5 mg via INTRAVENOUS

## 2017-03-30 MED ORDER — EPHEDRINE 5 MG/ML INJ
INTRAVENOUS | Status: AC
  Filled 2017-03-30: qty 20

## 2017-03-30 MED ORDER — ONDANSETRON HCL 4 MG/2ML IJ SOLN
INTRAMUSCULAR | Status: DC | PRN
  Administered 2017-03-30: 4 mg via INTRAVENOUS

## 2017-03-30 NOTE — Interval H&P Note (Signed)
History and Physical Interval Note:  03/30/2017 7:12 AM  Renee Jackson  has presented today for surgery, with the diagnosis of pancreatic mass  The various methods of treatment have been discussed with the patient and family. After consideration of risks, benefits and other options for treatment, the patient has consented to  Procedure(s): UPPER ENDOSCOPIC ULTRASOUND (EUS) LINEAR (N/A) as a surgical intervention .  The patient's history has been reviewed, patient examined, no change in status, stable for surgery.  I have reviewed the patient's chart and labs.  Questions were answered to the patient's satisfaction.     Milus Banister

## 2017-03-30 NOTE — Anesthesia Postprocedure Evaluation (Addendum)
Anesthesia Post Note  Patient: Renee Jackson  Procedure(s) Performed: Procedure(s) (LRB): UPPER ENDOSCOPIC ULTRASOUND (EUS) LINEAR (N/A)  Patient location during evaluation: Endoscopy Anesthesia Type: MAC Level of consciousness: awake and alert Pain management: pain level controlled Vital Signs Assessment: post-procedure vital signs reviewed and stable Respiratory status: spontaneous breathing, nonlabored ventilation, respiratory function stable and patient connected to nasal cannula oxygen Cardiovascular status: stable and blood pressure returned to baseline Anesthetic complications: no       Last Vitals:  Vitals:   03/30/17 0910 03/30/17 0920  BP: (!) 160/50 (!) 169/44  Pulse: 64 63  Resp: 17 19  Temp:      Last Pain:  Vitals:   03/30/17 0900  TempSrc: Oral                 Jimmie Dattilio,JAMES TERRILL

## 2017-03-30 NOTE — Discharge Instructions (Signed)
YOU HAD AN ENDOSCOPIC PROCEDURE TODAY: Refer to the procedure report that was given to you for any specific questions about what was found during the examination.  If the procedure report does not answer your questions, please call your gastroenterologist to clarify. ° °YOU SHOULD EXPECT: Some feelings of bloating in the abdomen. Passage of more gas than usual.  Walking can help get rid of the air that was put into your GI tract during the procedure and reduce the bloating. If you had a lower endoscopy (such as a colonoscopy or flexible sigmoidoscopy) you may notice spotting of blood in your stool or on the toilet paper.  ° °DIET: Your first meal following the procedure should be a light meal and then it is ok to progress to your normal diet.  A half-sandwich or bowl of soup is an example of a good first meal.  Heavy or fried foods are harder to digest and may make you feel nasueas or bloated.  Drink plenty of fluids but you should avoid alcoholic beverages for 24 hours. ° °ACTIVITY: Your care partner should take you home directly after the procedure.  You should plan to take it easy, moving slowly for the rest of the day.  You can resume normal activity the day after the procedure however you should NOT DRIVE or use heavy machinery for 24 hours (because of the sedation medicines used during the test).   ° °SYMPTOMS TO REPORT IMMEDIATELY  °A gastroenterologist can be reached at any hour.  Please call your doctor's office for any of the following symptoms: ° °· Following upper endoscopy (EGD, EUS, ERCP) ° Vomiting of blood or coffee ground material ° New, significant abdominal pain ° New, significant chest pain or pain under the shoulder blades ° Painful or persistently difficult swallowing ° New shortness of breath ° Black, tarry-looking stools ° °FOLLOW UP: °If any biopsies were taken you will be contacted by phone or by letter within the next 1-3 weeks.  Call your gastroenterologist if you have not heard about the  biopsies in 3 weeks.  °Please also call your gastroenterologist's office with any specific questions about appointments or follow up tests. ° °

## 2017-03-30 NOTE — Transfer of Care (Signed)
Immediate Anesthesia Transfer of Care Note  Patient: Renee Jackson  Procedure(s) Performed: Procedure(s): UPPER ENDOSCOPIC ULTRASOUND (EUS) LINEAR (N/A)  Patient Location: PACU and Endoscopy Unit  Anesthesia Type:MAC  Level of Consciousness: sedated  Airway & Oxygen Therapy: Patient Spontanous Breathing and Patient connected to nasal cannula oxygen  Post-op Assessment: Report given to RN and Post -op Vital signs reviewed and stable  Post vital signs: Reviewed and stable  Last Vitals:  Vitals:   03/30/17 0710  BP: (!) 186/85  Pulse: 64  Resp: 17  Temp: 36.8 C    Last Pain:  Vitals:   03/30/17 0710  TempSrc: Oral         Complications: No apparent anesthesia complications

## 2017-03-30 NOTE — Anesthesia Preprocedure Evaluation (Addendum)
Anesthesia Evaluation  Patient identified by MRN, date of birth, ID band Patient awake    Reviewed: Allergy & Precautions, NPO status , Patient's Chart, lab work & pertinent test results  Airway Mallampati: II  TM Distance: >3 FB Neck ROM: Full    Dental no notable dental hx. (+) Dental Advisory Given   Pulmonary asthma , sleep apnea , COPD, former smoker,    breath sounds clear to auscultation       Cardiovascular hypertension,  Rhythm:Regular Rate:Normal     Neuro/Psych Seizures -,     GI/Hepatic   Endo/Other  diabetesMorbid obesity  Renal/GU      Musculoskeletal   Abdominal (+) + obese,   Peds  Hematology   Anesthesia Other Findings   Reproductive/Obstetrics                           Anesthesia Physical Anesthesia Plan  ASA: III  Anesthesia Plan: MAC   Post-op Pain Management:    Induction:   Airway Management Planned: Natural Airway  Additional Equipment:   Intra-op Plan:   Post-operative Plan:   Informed Consent: I have reviewed the patients History and Physical, chart, labs and discussed the procedure including the risks, benefits and alternatives for the proposed anesthesia with the patient or authorized representative who has indicated his/her understanding and acceptance.     Plan Discussed with: CRNA  Anesthesia Plan Comments:         Anesthesia Quick Evaluation

## 2017-03-30 NOTE — Op Note (Signed)
[facility] Patient Name: Renee Jackson Procedure Date: 03/30/2017 MRN: [id number] Attending MD: Milus Banister , MD Date of Birth: 11/07/1946 CSN: [id number] Age: 71 Admit Type: Outpatient Procedure:                Upper EUS Indications:              Suspected mass in pancreas on MRI, Abnormal                            abdominal PET scan Providers:                Milus Banister, MDDaniel Rowe Clack, RN, , Nevin Bloodgood, Technician, Glenis Smoker, CRNA Referring MD:             Robynn Pane, PA [facility]) Medicines:                General Anesthesia Complications:            No immediate complications. Estimated blood loss:                            None. Estimated Blood Loss:     Estimated blood loss: none. Procedure:                Pre-Anesthesia Assessment:                           - Prior to the procedure, a History and Physical                            was performed, and patient medications and                            allergies were reviewed. The patient's tolerance of                            previous anesthesia was also reviewed. The risks                            and benefits of the procedure and the sedation                            options and risks were discussed with the patient.                            All questions were answered, and informed consent                            was obtained. Prior Anticoagulants: The patient has                            taken no previous anticoagulant or antiplatelet  agents. ASA Grade Assessment: II - A patient with                            mild systemic disease. After reviewing the risks                            and benefits, the patient was deemed in                            satisfactory condition to undergo the procedure.                           After obtaining informed consent, the endoscope was       passed under direct vision. Throughout the                            procedure, the patient's blood pressure, pulse, and                            oxygen saturations were monitored continuously. The                            [id number] ([id number]) scope was introduced through                            the mouth, and advanced to the second part of                            duodenum. The upper EUS was accomplished without                            difficulty. The patient tolerated the procedure                            well. Scope In: Scope Out: Findings:      Endoscopic Finding :      The examined esophagus was endoscopically normal.      The entire examined stomach was endoscopically normal.      The examined duodenum was endoscopically normal.      Endosonographic Finding :      1. An 2.2cm irregular, solid mass was identified in the pancreatic neck.       The mass was hypoechoic and heterogenous. The outer margins were       irregular. The endosonographic appearance of parenchyma and the upstream       pancreatic duct indicated duct dilation and a maximum duct diameter of 9       mm. The mass directly abuts the SMV/PV at the confluene for at least 1cm       (suggesting invasion). Fine needle aspiration for cytology was       performed. Color Doppler imaging was utilized prior to needle puncture       to confirm a lack of significant vascular structures within the needle       path. Three passes were made with the 25 gauge needle using a       transgastric approach.  A stylet was used. A cytotechnologist was present       to evaluate the adequacy of the specimen. Final cytology results are       pending.      2. The remaining pancreatic parenchyma was normal.      3. The CBD was normal, non-dilated      4. Gallbladder was normal.      5. Limited views of liver, spleen were normal. Impression:               - 2.2cm mass in the neck of pancreas causing main                             pancreatic duct obstruction, directly abutting the                            PV/SMV (suggesting invasion). 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 Awaiting                            final cytology. Moderate Sedation:      N/A- Per Anesthesia Care Recommendation:           - Discharge patient to home (ambulatory).                           - Await cytology results. Procedure Code(s):        --- Professional ---                           [phone], Esophagogastroduodenoscopy, flexible,                            transoral; with transendoscopic ultrasound-guided                            intramural or transmural fine needle                            aspiration/biopsy(s), (includes endoscopic                            ultrasound examination limited to the esophagus,                            stomach or duodenum, and adjacent structures) Diagnosis Code(s):        --- Professional ---                           K86.89, Other specified diseases of pancreas                           R93.3, Abnormal findings on diagnostic imaging of  other parts of digestive tract                           R93.5, Abnormal findings on diagnostic imaging of                            other abdominal regions, including retroperitoneum CPT copyright 2016 American Medical Association. All rights reserved. The codes documented in this report are preliminary and upon coder review may  be revised to meet current compliance requirements. Milus Banister, MD 03/30/2017 8:56:40 AM This report has been signed electronically. Number of Addenda: 0

## 2017-03-30 NOTE — H&P (View-Only) (Signed)
[facility] Hematology/Oncology Consultation   Name: Renee Jackson      MRN: [id number]    Location: Room/bed info not found  Date: 03/21/2017 Time:6:12 PM   REFERRING PHYSICIAN:  Gar Ponto, MD (Primary Care Provider)  REASON FOR CONSULT:  "Pancreatic Cancer"   DIAGNOSIS: Dorsal pancreatic duct dilatation and pancreatic atrophy with a hypoenhancing mass in the pancreatic body and head measuring 3.9 x 2.1 x 3.5 cm abutting the edge of the portal vein and SMV without obvious invasion or wrapping around the structures.  HISTORY OF PRESENT ILLNESS:   Renee Jackson is a 71 y.o. female with a medical history significant for hypertension, GERD, seasonal allergies, depression, diabetes mellitus, emphysema/COPD/asthma who is referred to the [facility] for "pancreatic cancer."  Patient reports that she presented to her primary care physician with epigastric discomfort.  It was presumed that she had issues with her gallbladder.  She was treated symptomatically with dietary restrictions.  Her epigastric pain continued and symptoms progressed to include nausea and vomiting with all foods.  This led her to the emergency department in April 2018.  In the emergency department, ultrasound was performed which led to further imaging including MR abdomen.  MRI of the abdomen demonstrated a hypoenhancing mass in the pancreatic body and head measuring 3.9 x 2.1 x 3.5 cm extending to the posterior medial pancreatic margin and abutting the edge of the portal vein and SMV without obvious invasion or wrapping around of the structures.  The common bile duct skirts the right lateral margin of the mass is not definitely encased by the mass.  There is no common bile duct dilatation.  No definite venous invasion of the mass nor wrapping of the venous structures by the mass identified.  She was subsequently referred to the [facility] for further evaluation and  workup.  The patient reports abdominal bloating.  She admits to a decrease in appetite in addition to increase in epigastric pain with eating.  She reports a 70 pound weight loss over 2 years that was unintentional.  She is unable to quantify her weight loss over the past 3-6 months.  In an attempt to combat her epigastric pain with eating, she has been eating small meals.  Additionally, she has pain medication that has been helpful.  The patient denies any episodes of jaundice.  Her daughter, who accompanies her, reports that the patient was sick in January 2018 and at that time she did appear a little jaundiced to her.  Additionally, her daughter reports multiple antibiotics over the last 4 months.  Review of Systems  Constitutional: Positive for weight loss. Negative for chills and fever.  HENT: Negative.   Respiratory: Negative.  Negative for cough and shortness of breath.   Cardiovascular: Positive for leg swelling. Negative for chest pain.  Gastrointestinal: Positive for abdominal pain, nausea and vomiting. Negative for blood in stool, constipation, diarrhea and melena.  Genitourinary: Negative.   Musculoskeletal: Negative.   Skin: Negative.   Neurological: Negative.  Negative for weakness.  Endo/Heme/Allergies: Negative.   Psychiatric/Behavioral: Negative.     PAST MEDICAL HISTORY:   Past Medical History:  Diagnosis Date  . Asthma   . Atrial fibrillation ([place])   . COPD (chronic obstructive pulmonary disease) ([place])   . Depression   . Diabetes ([place])   . Diabetic neuropathy ([facility])    feet and legs  . GERD (gastroesophageal reflux disease)   .  Hypercholesteremia   . Hypertension   . Pancreatic mass 03/21/2017  . Umbilical hernia     ALLERGIES: Allergies  Allergen Reactions  . Sulfa Antibiotics       MEDICATIONS: I have reviewed the patient's current medications.    No current outpatient prescriptions on file prior to visit.   No current facility-administered  medications on file prior to visit.      PAST SURGICAL HISTORY Past Surgical History:  Procedure Laterality Date  . UMBILICAL HERNIA REPAIR     multiple times    FAMILY HISTORY: Family History  Problem Relation Age of Onset  . Heart disease Mother   . Dementia Father    Mother deceased at the age of 69 secondary to complications of heart disease and diabetes. Father deceased at the age of 83 secondary to complications of dementia. She has 5 brothers, 4 of which are deceased. She has 3 sisters. She has a daughter 73 years old with multiple medical issues including intracranial hypertension, neuropathy, back issues, and morbid obesity. She has 18 son is 8 years old in good health.  SOCIAL HISTORY:  reports that she quit smoking about 22 years ago. Her smoking use included Cigarettes. She has a 20.00 pack-year smoking history. She has never used smokeless tobacco. She reports that she drinks alcohol. She reports that she does not use drugs.  She denies any illicit drug abuse.  She rarely drinks alcohol.  She does have a 1 pack per day 20 year smoking history quitting 22 years ago.  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  She is Psychologist, forensic and religion.  She is widowed.  Her husband passed away in 01-Dec-2016 secondary to stage IV pancreatic cancer.  He was not offered chemotherapy given his extent of disease.  Social History   Social History  . Marital status: Married    Spouse name: N/A  . Number of children: N/A  . Years of education: N/A   Social History Main Topics  . Smoking status: Former Smoker    Packs/day: 1.00    Years: 20.00    Types: Cigarettes    Quit date: 03/22/1995  . Smokeless tobacco: Never Used  . Alcohol use Yes     Comment: rare  . Drug use: No  . Sexual activity: No   Other Topics Concern  . None   Social History Narrative  . None    PERFORMANCE STATUS: The patient's performance  status is 1 - Symptomatic but completely ambulatory  PHYSICAL EXAM: Most Recent Vital Signs: Blood pressure (!) 173/48, pulse 74, temperature 98.6 F (37 C), temperature source Oral, resp. rate 20, height 5\' 2"  (1.575 m), weight 218 lb 11.2 oz (99.2 kg), SpO2 98 %. BP (!) 173/48 (BP Location: Right Arm, Patient Position: Sitting)   Pulse 74   Temp 98.6 F (37 C) (Oral)   Resp 20   Ht 5\' 2"  (1.575 m)   Wt 218 lb 11.2 oz (99.2 kg)   SpO2 98%   BMI 40.00 kg/m   General Appearance:    Alert, cooperative, no distress, appears stated age, accompanied by her daughter Vita Barley.  Head:    Normocephalic, without obvious abnormality, atraumatic  Eyes:    Conjunctiva/corneas clear, EOM's intact, both eyes  Ears:    Normal TM's and external ear canals, both ears  Nose:   Nares normal, septum midline, mucosa normal, no drainage  or sinus tenderness  Throat:   Lips, mucosa, and tongue normal; teeth and gums normal.  Dentition is fair.  Neck:   Supple, symmetrical, trachea midline, no adenopathy;    thyroid:  no enlargement/tenderness/nodules; no carotid   bruit or JVD  Back:     Symmetric, no curvature, ROM normal, no CVA tenderness  Lungs:     Clear to auscultation bilaterally, respirations unlabored  Chest Wall:    No tenderness or deformity   Heart:    Regular rate and rhythm, S1 and S2 normal, no murmur, rub   or gallop  Breast Exam:    Not examined  Abdomen:     Soft, non-tender, bowel sounds active all four quadrants,    no masses, no organomegaly  Genitalia:    Not examined  Rectal:    Not examined  Extremities:   B/L LE edema, 1+ pitting, no cyanosis or edema  Pulses:   2+ and symmetric all extremities  Skin:   Skin color, texture, turgor normal, no rashes or lesions  Lymph nodes:   Cervical, supraclavicular, and axillary nodes normal  Neurologic:   CNII-XII intact, normal strength, sensation and reflexes    throughout    LABORATORY DATA:  CBC    Component Value Date/Time   WBC  11.7 (H) 03/21/2017 1311   RBC 4.38 03/21/2017 1311   HGB 12.4 03/21/2017 1311   HCT 36.7 03/21/2017 1311   PLT 249 03/21/2017 1311   MCV 83.8 03/21/2017 1311   MCH 28.3 03/21/2017 1311   MCHC 33.8 03/21/2017 1311   RDW 12.9 03/21/2017 1311   LYMPHSABS 3.1 03/21/2017 1311   MONOABS 0.6 03/21/2017 1311   EOSABS 0.2 03/21/2017 1311   BASOSABS 0.1 03/21/2017 1311     Chemistry      Component Value Date/Time   NA 132 (L) 03/21/2017 1311   K 4.6 03/21/2017 1311   CL 93 (L) 03/21/2017 1311   CO2 28 03/21/2017 1311   BUN 12 03/21/2017 1311   CREATININE 0.70 03/21/2017 1311      Component Value Date/Time   CALCIUM 9.3 03/21/2017 1311   ALKPHOS 69 03/21/2017 1311   AST 17 03/21/2017 1311   ALT 14 03/21/2017 1311   BILITOT 0.7 03/21/2017 1311                 RADIOGRAPHY:          PATHOLOGY:  Not available  ASSESSMENT/PLAN:   Pancreatic mass Pancreatic mass measuring .9 x 2.1 x 3.5 cm abutting the edge of the portal vein and SMV without obvious invasion or wrapping around the structures.  I personally reviewed and went over laboratory results with the patient.  The results are noted within this dictation.  Labs from Dr. Gypsy Balsam office are reviewed.  I personally reviewed and went over radiographic studies with the patient.  The results are noted within this dictation.  MR abdomen from [facility] is reviewed.  MRI of the abdomen demonstrated a hypoenhancing mass in the pancreatic body and head measuring 3.9 x 2.1 x 3.5 cm extending to the posterior medial pancreatic margin and abutting the edge of the portal vein and SMV without obvious invasion or wrapping around of the structures.  The common bile duct skirts the right lateral margin of the mass is not definitely encased by the mass.  There is no common bile duct dilatation.  No definite venous invasion of the mass nor wrapping of the venous structures by the mass identified.  We discussed the and suspicion  for malignancy possible implication of her CT imaging, namely pancreatic cancer.  She is advised that without a biopsy a diagnosis cannot be made.  We discussed options for diagnosis.  She has been advised that and endoscopic ultrasound is necessary.  She is agreeable to this procedure.  We will refer the patient to Dr. Joylene Grapes. Paulita Fujita for EUS.  I have messaged Dr. Ardis Hughs about facilitating an appointment with him for an EUS with biopsy.  He has requested a way to review imaging results.  MR was performed at [facility].  I have called radiology and they confirm that the images are viewable via PACS.  I have asked him if he has access to PACS.  If not, I will work on getting him a copy of the disc.  Order is placed for PET imaging to complete staging.  Labs today: CBC diff, CMET, CA 19-9.  Based upon PET scan and EUS result, the patient may be a surgical candidate.  If so, we will refer the patient to Dr. Eugenia Pancoast at [facility] for surgical oncology consultation.  Problem list reviewed with patient and edited accordingly.  Medications are reviewed with the patient and edited accordingly.  Patient will return in 2-3 weeks for follow-up after all testing above has been completed for further medical oncology recommendations.  More than 50% of the time spent with the patient was utilized for counseling and coordination of care.   ORDERS PLACED FOR THIS ENCOUNTER: Orders Placed This Encounter  Procedures  . NM PET Image Initial (PI) Skull Base To Thigh  . CBC with Differential  . Comprehensive metabolic panel  . Cancer antigen 19-9  . Ambulatory referral to Social Work  . Ambulatory referral to Social Work    MEDICATIONS PRESCRIBED THIS ENCOUNTER: Meds ordered this encounter  Medications  . atorvastatin (LIPITOR) 10 MG tablet  . citalopram (CELEXA) 20 MG tablet  . glipiZIDE (GLUCOTROL XL) 10 MG 24 hr tablet  . losartan (COZAAR) 100 MG tablet  . metFORMIN (GLUCOPHAGE) 1000 MG tablet  .  metoprolol succinate (TOPROL-XL) 50 MG 24 hr tablet  . montelukast (SINGULAIR) 10 MG tablet  . omeprazole (PRILOSEC) 20 MG capsule  . promethazine (PHENERGAN) 25 MG tablet  . HYDROcodone-acetaminophen (NORCO/VICODIN) 5-325 MG tablet  . hydrochlorothiazide (HYDRODIURIL) 25 MG tablet  . aspirin EC 81 MG tablet    Sig: Take 81 mg by mouth daily.    All questions were answered. The patient knows to call the clinic with any problems, questions or concerns. We can certainly see the patient much sooner if necessary.  Patient discussed with Dr. Talbert Cage and together we ascertained an up-to-date interval history, and examined the patient.  Dr. Talbert Cage developed the patient's assessment and plan.  This was a shared visit-consultation.  Her attestation will follow below.  This note is electronically signed by: Doy Mince 03/21/2017 6:12 PM   ATTENDING ATTESTATION: Patient seen and examined with Robynn Pane, PA. Agree with assessment and plan as outlined above.   Twana First, MD Medical Oncology

## 2017-03-31 ENCOUNTER — Encounter (HOSPITAL_COMMUNITY): Payer: Self-pay | Admitting: Gastroenterology

## 2017-04-06 ENCOUNTER — Encounter (HOSPITAL_COMMUNITY): Payer: Self-pay | Admitting: Lab

## 2017-04-06 ENCOUNTER — Encounter (HOSPITAL_BASED_OUTPATIENT_CLINIC_OR_DEPARTMENT_OTHER): Payer: Medicare Other | Admitting: Oncology

## 2017-04-06 ENCOUNTER — Encounter (HOSPITAL_COMMUNITY): Payer: Self-pay

## 2017-04-06 DIAGNOSIS — R109 Unspecified abdominal pain: Secondary | ICD-10-CM | POA: Diagnosis not present

## 2017-04-06 DIAGNOSIS — Z87891 Personal history of nicotine dependence: Secondary | ICD-10-CM

## 2017-04-06 DIAGNOSIS — C259 Malignant neoplasm of pancreas, unspecified: Secondary | ICD-10-CM | POA: Diagnosis present

## 2017-04-06 HISTORY — DX: Malignant neoplasm of pancreas, unspecified: C25.9

## 2017-04-06 NOTE — Progress Notes (Unsigned)
appt at [facility] with Dr Crisoforo Oxford @May  1.  pateitn aware of appt

## 2017-04-06 NOTE — Progress Notes (Signed)
[facility] Hematology/Oncology Progress Note   Name: Renee Jackson      MRN: [id number]    Location: Room/bed info not found  Date: 04/06/2017 Time:10:03 AM   REFERRING PHYSICIAN:  Gar Ponto, MD (Primary Care Provider)  REASON FOR CONSULT:  Pancreatic Cancer   DIAGNOSIS: Pancreatic Adenocarcinoma    Pancreatic adenocarcinoma ([place])   03/27/2017 PET scan    Relatively low-level hypermetabolism corresponding to the pancreatic head mass. This measures on the order of 3.8 cm and a S.U.V. max of 4.4 on image 107/series 4. Re- demonstration of pancreatic body and tail atrophy with duct dilatation.  No other hypermetabolism within abdominopelvic nodes or parenchyma. Abdominopelvic findings deferred to prior diagnostic CT. Cholelithiasis. Abdominal aortic atherosclerosis. Fat containing ventral abdominal wall laxity, status post hernia repair.      03/30/2017 Initial Diagnosis    Pancreatic adenocarcinoma ([place])      03/30/2017 Procedure    EUS with FNA of pancreatic mass performed, path positive for adenocarcinoma.      HISTORY OF PRESENT ILLNESS:   Renee Jackson is a 71 y.o. female returns for follow up of a pancreatic mass suspicious for cancer.  Patient reports that she presented to her primary care physician with epigastric discomfort.  It was presumed that she had issues with her gallbladder.  She was treated symptomatically with dietary restrictions.  Her epigastric pain continued and symptoms progressed to include nausea and vomiting with all foods.  This led her to the emergency department in April 2018.  In the emergency department, ultrasound was performed which led to further imaging including MR abdomen.  MRI of the abdomen demonstrated a hypoenhancing mass in the pancreatic body and head measuring 3.9 x 2.1 x 3.5 cm extending to the posterior medial pancreatic margin and abutting the edge of the portal vein and SMV without obvious invasion or  wrapping around of the structures.  The common bile duct skirts the right lateral margin of the mass is not definitely encased by the mass.  There is no common bile duct dilatation.  No definite venous invasion of the mass nor wrapping of the venous structures by the mass identified. 03/30/17: EUS with FNA of pancreatic mass performed, path positive for adenocarcinoma.'  She has been very tired and cold recently. She is sleeping a lot. She reports a lot of eye infections that bother her. If she eats too much or the wrong foods, she has an aching epigastric abdominal pain that sometimes radiates to her shoulder blades. She takes 1/2 hydrocodone BID. Denies chest pain, SOB, weight loss, or any other concerns.   Review of Systems  Constitutional: Positive for chills and malaise/fatigue. Negative for weight loss.  HENT: Negative.   Eyes: Positive for pain (b/l eye infections).  Respiratory: Negative.  Negative for shortness of breath.   Cardiovascular: Negative.  Negative for chest pain.  Gastrointestinal: Positive for abdominal pain (worse after eating, radiates to shoulder blades).  Genitourinary: Negative.   Musculoskeletal: Negative.   Skin: Negative.   Neurological: Negative.   Endo/Heme/Allergies: Negative.   Psychiatric/Behavioral: Negative.   All other systems reviewed and are negative. 14 point review of systems was performed and is negative except as detailed under history of present illness and above  PAST MEDICAL HISTORY:   Past Medical History:  Diagnosis Date  . Arthritis    hands  . Asthma   . Atrial fibrillation ([place])   . Chronic lower back pain   .  COPD (chronic obstructive pulmonary disease) ([place])   . Depression   . Diabetes ([place])    type 2  . Diabetic neuropathy ([facility])    feet and legs  . GERD (gastroesophageal reflux disease)   . Headache    migraines  . Hypercholesteremia   . Hypertension   . Neuropathy    both feet  . Pancreatic mass 03/21/2017  . Pancreatic  mass   . Pneumonia 1995 severe case   2 times before that  . Seizures ([place]) as child  to age 22   with high fevers  . Sleep apnea    cpap uses sometimes  . Umbilical hernia     ALLERGIES: Allergies  Allergen Reactions  . Sulfa Antibiotics     itching      MEDICATIONS: I have reviewed the patient's current medications.    Current Outpatient Prescriptions on File Prior to Visit  Medication Sig Dispense Refill  . aspirin EC 81 MG tablet Take 81 mg by mouth daily.    Marland Kitchen atorvastatin (LIPITOR) 10 MG tablet Take 10 mg by mouth every morning.     . citalopram (CELEXA) 20 MG tablet Take 20 mg by mouth every morning.     Marland Kitchen glipiZIDE (GLUCOTROL XL) 10 MG 24 hr tablet Take 10 mg by mouth daily with breakfast.     . Homeopathic Products (ALLERGY MEDICINE PO) Take 1 tablet by mouth at bedtime.    . hydrochlorothiazide (HYDRODIURIL) 25 MG tablet Take 25 mg by mouth every morning.     Marland Kitchen HYDROcodone-acetaminophen (NORCO/VICODIN) 5-325 MG tablet Take 0.5 tablets by mouth every 6 (six) hours as needed for moderate pain.     Marland Kitchen losartan (COZAAR) 100 MG tablet Take 100 mg by mouth every morning.     . metFORMIN (GLUCOPHAGE) 1000 MG tablet Take 1,000 mg by mouth 2 (two) times daily with a meal.     . metoprolol succinate (TOPROL-XL) 50 MG 24 hr tablet Take 50 mg by mouth every morning.     . montelukast (SINGULAIR) 10 MG tablet Take 10 mg by mouth every morning.     Marland Kitchen omeprazole (PRILOSEC) 20 MG capsule Take 40 mg by mouth every morning.     . promethazine (PHENERGAN) 25 MG tablet Take 25 mg by mouth every 6 (six) hours as needed for nausea or vomiting.      No current facility-administered medications on file prior to visit.      PAST SURGICAL HISTORY Past Surgical History:  Procedure Laterality Date  . CESAREAN SECTION     x 1  . EUS N/A 03/30/2017   Procedure: UPPER ENDOSCOPIC ULTRASOUND (EUS) LINEAR;  Surgeon: Milus Banister, MD;  Location: [facility];  Service: Endoscopy;  Laterality:  N/A;  . HERNIA REPAIR     x 2 umbilical   . UMBILICAL HERNIA REPAIR     multiple times    FAMILY HISTORY: Family History  Problem Relation Age of Onset  . Heart disease Mother   . Dementia Father    Mother deceased at the age of 61 secondary to complications of heart disease and diabetes. Father deceased at the age of 53 secondary to complications of dementia. She has 5 brothers, 4 of which are deceased. She has 3 sisters. She has a daughter 67 years old with multiple medical issues including intracranial hypertension, neuropathy, back issues, and morbid obesity. She has 41 son is 60 years old in good health.  SOCIAL HISTORY:  reports that she quit smoking  about 23 years ago. Her smoking use included Cigarettes. She has a 20.00 pack-year smoking history. She has never used smokeless tobacco. She reports that she does not drink alcohol or use drugs.  She denies any illicit drug abuse.  She rarely drinks alcohol.  She does have a 1 pack per day 20 year smoking history quitting 22 years ago.  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  She is Psychologist, forensic and religion.  She is widowed.  Her husband passed away in 05-Dec-2016 secondary to stage IV pancreatic cancer.  He was not offered chemotherapy given his extent of disease.  Social History   Social History  . Marital status: Married    Spouse name: N/A  . Number of children: N/A  . Years of education: N/A   Social History Main Topics  . Smoking status: Former Smoker    Packs/day: 1.00    Years: 20.00    Types: Cigarettes    Quit date: 03/21/1994  . Smokeless tobacco: Never Used  . Alcohol use No  . Drug use: No  . Sexual activity: No   Other Topics Concern  . None   Social History Narrative  . None    PERFORMANCE STATUS: The patient's performance status is 1 - Symptomatic but completely ambulatory  PHYSICAL EXAM: Most Recent Vital Signs: Blood pressure (!)  167/55, pulse 80, temperature 98.2 F (36.8 C), temperature source Oral, resp. rate 20, weight 222 lb 8 oz (100.9 kg), SpO2 100 %. BP (!) 167/55 (BP Location: Left Arm, Patient Position: Sitting)   Pulse 80   Temp 98.2 F (36.8 C) (Oral)   Resp 20   Wt 222 lb 8 oz (100.9 kg)   SpO2 100%   BMI 40.70 kg/m   Physical Exam  Constitutional: She is oriented to person, place, and time and well-developed, well-nourished, and in no distress.  HENT:  Head: Normocephalic and atraumatic.  Eyes: EOM are normal. Pupils are equal, round, and reactive to light.  Neck: Normal range of motion. Neck supple.  Cardiovascular: Normal rate, regular rhythm and normal heart sounds.   Pulmonary/Chest: Effort normal and breath sounds normal.  Abdominal: Soft. Bowel sounds are normal. There is tenderness.  epigastric tenderness RUQ tenderness No masses palpated   Musculoskeletal: Normal range of motion.  Neurological: She is alert and oriented to person, place, and time. Gait normal.  Skin: Skin is warm and dry.  Nursing note and vitals reviewed.  LABORATORY DATA:  CBC    Component Value Date/Time   WBC 11.7 (H) 03/21/2017 1311   RBC 4.38 03/21/2017 1311   HGB 12.4 03/21/2017 1311   HCT 36.7 03/21/2017 1311   PLT 249 03/21/2017 1311   MCV 83.8 03/21/2017 1311   MCH 28.3 03/21/2017 1311   MCHC 33.8 03/21/2017 1311   RDW 12.9 03/21/2017 1311   LYMPHSABS 3.1 03/21/2017 1311   MONOABS 0.6 03/21/2017 1311   EOSABS 0.2 03/21/2017 1311   BASOSABS 0.1 03/21/2017 1311     Chemistry      Component Value Date/Time   NA 132 (L) 03/21/2017 1311   K 4.6 03/21/2017 1311   CL 93 (L) 03/21/2017 1311   CO2 28 03/21/2017 1311   BUN 12 03/21/2017 1311   CREATININE 0.70 03/21/2017 1311      Component Value Date/Time   CALCIUM 9.3 03/21/2017 1311   ALKPHOS 69 03/21/2017 1311   AST 17 03/21/2017 1311  ALT 14 03/21/2017 1311   BILITOT 0.7 03/21/2017 1311      RADIOGRAPHY: I have personally reviewed  the radiological images as listed and agreed with the findings in the report.  03/30/2017    PET Scan 03/27/2017 IMPRESSION: 1. Relatively low-level hypermetabolism corresponding to the pancreatic head mass. 2. Given relatively low-level hypermetabolism within the primary, PET is likely of low sensitivity for metastatic disease. Given this mild limitation, no hypermetabolic metastatic disease identified. 3. Small hiatal hernia 4.  Coronary artery atherosclerosis. Aortic atherosclerosis. 5. Cholelithiasis.  PATHOLOGY:     ASSESSMENT/PLAN:  Stage IB Pancreatic Adenocarcinoma Cancer Staging Pancreatic adenocarcinoma [facility]) Staging form: Exocrine Pancreas, AJCC 8th Edition - Clinical stage from 04/06/2017: Stage IB (cT2, cN0, cM0) - Signed by Twana First, MD on 04/06/2017    Reviewed diagnosis of confirmed adenocarcinoma with the patient and her family. Based on the EUS its questionable whether the tumor is invading into the PV/SMV.  We have made a referral to Dr. Eugenia Pancoast at [facility] for surgical evaluation to see whether she is resectable or borderline resectable. Patient has an appointment next week. Based on his recommendations we will see if he thinks patient is a candidate for an upfront [place] or would need neoadjuvant treatment. Labs reviewed with the patient today.  She will return for follow up in 2 weeks, sooner if Dr. Eugenia Pancoast thinks she needs neoadjuvant treatment.    All questions were answered. The patient knows to call the clinic with any problems, questions or concerns. We can certainly see the patient much sooner if necessary.  This document serves as a record of services personally performed by Twana First, MD. It was created on her behalf by Martinique Casey, a trained medical scribe. The creation of this record is based on the scribe's personal observations and the provider's statements to them. This document has been checked and approved by the attending provider.  I  have reviewed the above documentation for accuracy and completeness and I agree with the above.  This note is electronically signed by: Martinique M Casey 04/06/2017 10:03 AM

## 2017-04-06 NOTE — Patient Instructions (Addendum)
[place] at [facility] Discharge Instructions  RECOMMENDATIONS MADE BY THE CONSULTANT AND ANY TEST RESULTS WILL BE SENT TO YOUR REFERRING PHYSICIAN.  You were seen today by Dr. Twana First We will refer you to the general surgeon at [facility] Follow up in 2-3 weeks   Thank you for choosing [place] at [facility] to provide your oncology and hematology care.  To afford each patient quality time with our provider, please arrive at least 15 minutes before your scheduled appointment time.    If you have a lab appointment with the [place] please come in thru the  Main Entrance and check in at the main information desk  You need to re-schedule your appointment should you arrive 10 or more minutes late.  We strive to give you quality time with our providers, and arriving late affects you and other patients whose appointments are after yours.  Also, if you no show three or more times for appointments you may be dismissed from the clinic at the providers discretion.     Again, thank you for choosing [facility].  Our hope is that these requests will decrease the amount of time that you wait before being seen by our physicians.       _____________________________________________________________  Should you have questions after your visit to [facility], please contact our office at [phone] between the hours of 8:30 a.m. and 4:30 p.m.  Voicemails left after 4:30 p.m. will not be returned until the following business day.  For prescription refill requests, have your pharmacy contact our office.       Resources For Cancer Patients and their Caregivers ? American Cancer Society: Can assist with transportation, wigs, general needs, runs Look Good Feel Better.        605 328 9839 ? Cancer Care: Provides financial assistance, online support groups, medication/co-pay assistance.  1-800-813-HOPE 3218584033) ? [place] Assists [place] cancer patients and their families through emotional , educational and financial support.  651-259-3449 ? [place] DSS Where to apply for food stamps, Medicaid and utility assistance. (414)146-7436 ? RCATS: Transportation to medical appointments. 480-713-0642 ? Social Security Administration: May apply for disability if have a Stage IV cancer. (515)816-5660 (714)436-7880 ? [employer], Disability and Transit Services: Assists with nutrition, care and transit needs. [place] Support Programs: @10RELATIVEDAYS @ > Cancer Support Group  2nd Tuesday of the month 1pm-2pm, Journey Room  > Creative Journey  3rd Tuesday of the month 1130am-1pm, Journey Room  > Look Good Feel Better  1st Wednesday of the month 10am-12 noon, Journey Room (Call [place] to register [phone])

## 2017-04-13 ENCOUNTER — Encounter (HOSPITAL_COMMUNITY): Payer: Medicare Other | Attending: Oncology | Admitting: Oncology

## 2017-04-13 ENCOUNTER — Encounter (HOSPITAL_COMMUNITY): Payer: Self-pay

## 2017-04-13 VITALS — BP 175/41 | HR 62 | Temp 98.5°F | Resp 20 | Wt 220.8 lb

## 2017-04-13 DIAGNOSIS — C259 Malignant neoplasm of pancreas, unspecified: Secondary | ICD-10-CM

## 2017-04-13 DIAGNOSIS — K869 Disease of pancreas, unspecified: Secondary | ICD-10-CM | POA: Insufficient documentation

## 2017-04-13 MED ORDER — ONDANSETRON HCL 8 MG PO TABS: 8.0000 mg | ORAL_TABLET | Freq: Two times a day (BID) | ORAL | 1 refills | Status: DC | PRN

## 2017-04-13 MED ORDER — LIDOCAINE-PRILOCAINE 2.5-2.5 % EX CREA: TOPICAL_CREAM | CUTANEOUS | 3 refills | Status: DC

## 2017-04-13 MED ORDER — LORAZEPAM 0.5 MG PO TABS: 0.5000 mg | ORAL_TABLET | Freq: Four times a day (QID) | ORAL | 0 refills | Status: DC | PRN

## 2017-04-13 NOTE — Patient Instructions (Addendum)
[place] at [facility] Discharge Instructions  RECOMMENDATIONS MADE BY THE CONSULTANT AND ANY TEST RESULTS WILL BE SENT TO YOUR REFERRING PHYSICIAN.  You were seen today by Dr. Twana First We will get you referred to Dr. Arnoldo Morale for port a cath placement We will see you back in 3 weeks for treatment   Thank you for choosing [place] at [facility] to provide your oncology and hematology care.  To afford each patient quality time with our provider, please arrive at least 15 minutes before your scheduled appointment time.    If you have a lab appointment with the [place] please come in thru the  Main Entrance and check in at the main information desk  You need to re-schedule your appointment should you arrive 10 or more minutes late.  We strive to give you quality time with our providers, and arriving late affects you and other patients whose appointments are after yours.  Also, if you no show three or more times for appointments you may be dismissed from the clinic at the providers discretion.     Again, thank you for choosing [facility].  Our hope is that these requests will decrease the amount of time that you wait before being seen by our physicians.       _____________________________________________________________  Should you have questions after your visit to [facility], please contact our office at [phone] between the hours of 8:30 a.m. and 4:30 p.m.  Voicemails left after 4:30 p.m. will not be returned until the following business day.  For prescription refill requests, have your pharmacy contact our office.       Resources For Cancer Patients and their Caregivers ? American Cancer Society: Can assist with transportation, wigs, general needs, runs Look Good Feel Better.        (276) 125-8960 ? Cancer Care: Provides financial assistance, online support groups, medication/co-pay assistance.   1-800-813-HOPE (629) 199-4158) ? [place] Assists [place] cancer patients and their families through emotional , educational and financial support.  434-020-4626 ? [place] DSS Where to apply for food stamps, Medicaid and utility assistance. 831 579 4509 ? RCATS: Transportation to medical appointments. 843-169-1667 ? Social Security Administration: May apply for disability if have a Stage IV cancer. (781)112-5386 938-504-1421 ? [employer], Disability and Transit Services: Assists with nutrition, care and transit needs. [place] Support Programs: @10RELATIVEDAYS @ > Cancer Support Group  2nd Tuesday of the month 1pm-2pm, Journey Room  > Creative Journey  3rd Tuesday of the month 1130am-1pm, Journey Room  > Look Good Feel Better  1st Wednesday of the month 10am-12 noon, Journey Room (Call [place] to register [phone])

## 2017-04-13 NOTE — Progress Notes (Signed)
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      03/27/2017 PET scan    Relatively low-level hypermetabolism corresponding to the pancreatic head mass. This measures on the order of 3.8 cm and a S.U.V. max of 4.4 on image 107/series 4. Re- demonstration of pancreatic body and tail atrophy with duct dilatation.  No other hypermetabolism within abdominopelvic nodes or parenchyma. Abdominopelvic findings deferred to prior diagnostic CT. Cholelithiasis. Abdominal aortic atherosclerosis. Fat containing ventral abdominal wall laxity, status post hernia repair.      03/30/2017 Initial Diagnosis    Pancreatic adenocarcinoma ([place])      03/30/2017 Procedure    EUS with FNA of pancreatic mass performed, path positive for adenocarcinoma.      HISTORY OF PRESENT ILLNESS:   Renee Jackson is a 71 y.o. female returns for follow up of a pancreatic mass suspicious for cancer.  Patient reports that she presented to her primary care physician with epigastric discomfort.  It was presumed that she had issues with her gallbladder.  She was treated symptomatically with dietary restrictions.  Her epigastric pain continued and symptoms progressed to include nausea and vomiting with all foods.  This led her to the emergency department in April 2018.  In the emergency department, ultrasound was performed which led to further imaging including MR abdomen.  MRI of the abdomen demonstrated a hypoenhancing mass in the pancreatic body and head measuring 3.9 x  2.1 x 3.5 cm extending to the posterior medial pancreatic margin and abutting the edge of the portal vein and SMV without obvious invasion or wrapping around of the structures.  The common bile duct skirts the right lateral margin of the mass is not definitely encased by the mass.  There is no common bile duct dilatation.  No definite venous invasion of the mass nor wrapping of the venous structures by the mass identified. 03/30/17: EUS with FNA of pancreatic mass performed, path positive for adenocarcinoma.'  [name] presents to the clinic for continuing follow up.  She reports saw Dr. Crisoforo Oxford for surgical evaluation on 5/1 and he recommended neoadjuvant chemotherapy prior to surgery. She notes she has mild epigastric pain. She notes she lost a few pounds since her last visit. She reports not eating like she used to. She has been trying to eat more healthy. She reports normal bowel movements.  She notes having difficulty breathing due to her sinuses. This may be attributed to allergies.  Denies chest pain, sob, unexplained weight loss.       Review of Systems  Constitutional: Negative for weight loss.  HENT: Negative.   Eyes: Negative.   Respiratory: Negative.  Negative for shortness of breath.        Difficulty breathing due to her sinuses/allergies  Cardiovascular: Negative.  Negative for chest pain.  Gastrointestinal: Positive for abdominal pain (mild).  Genitourinary: Negative.   Musculoskeletal: Negative.   Skin: Negative.   Neurological: Negative.   Endo/Heme/Allergies: Negative.   Psychiatric/Behavioral: Negative.   All other  systems reviewed and are negative. 14 point review of systems was performed and is negative except as detailed under history of present illness and above  PAST MEDICAL HISTORY:   Past Medical History:  Diagnosis Date  . Arthritis    hands  . Asthma   . Atrial fibrillation ([place])   . Chronic lower back pain   . COPD (chronic obstructive pulmonary  disease) ([place])   . Depression   . Diabetes ([place])    type 2  . Diabetic neuropathy ([facility])    feet and legs  . GERD (gastroesophageal reflux disease)   . Headache    migraines  . Hypercholesteremia   . Hypertension   . Neuropathy    both feet  . Pancreatic mass 03/21/2017  . Pancreatic mass   . Pneumonia 1995 severe case   2 times before that  . Seizures ([place]) as child  to age 62   with high fevers  . Sleep apnea    cpap uses sometimes  . Umbilical hernia     ALLERGIES: Allergies  Allergen Reactions  . Sulfa Antibiotics     itching      MEDICATIONS: I have reviewed the patient's current medications.    Current Outpatient Prescriptions on File Prior to Visit  Medication Sig Dispense Refill  . aspirin EC 81 MG tablet Take 81 mg by mouth daily.    Marland Kitchen atorvastatin (LIPITOR) 10 MG tablet Take 10 mg by mouth every morning.     . citalopram (CELEXA) 20 MG tablet Take 20 mg by mouth every morning.     Marland Kitchen glipiZIDE (GLUCOTROL XL) 10 MG 24 hr tablet Take 10 mg by mouth daily with breakfast.     . Homeopathic Products (ALLERGY MEDICINE PO) Take 1 tablet by mouth at bedtime.    . hydrochlorothiazide (HYDRODIURIL) 25 MG tablet Take 25 mg by mouth every morning.     Marland Kitchen HYDROcodone-acetaminophen (NORCO/VICODIN) 5-325 MG tablet Take 0.5 tablets by mouth every 6 (six) hours as needed for moderate pain.     Marland Kitchen losartan (COZAAR) 100 MG tablet Take 100 mg by mouth every morning.     . metFORMIN (GLUCOPHAGE) 1000 MG tablet Take 1,000 mg by mouth 2 (two) times daily with a meal.     . metoprolol succinate (TOPROL-XL) 50 MG 24 hr tablet Take 50 mg by mouth every morning.     . montelukast (SINGULAIR) 10 MG tablet Take 10 mg by mouth every morning.     Marland Kitchen omeprazole (PRILOSEC) 20 MG capsule Take 40 mg by mouth every morning.     . promethazine (PHENERGAN) 25 MG tablet Take 25 mg by mouth every 6 (six) hours as needed for nausea or vomiting.      No current facility-administered medications on file  prior to visit.      PAST SURGICAL HISTORY Past Surgical History:  Procedure Laterality Date  . CESAREAN SECTION     x 1  . EUS N/A 03/30/2017   Procedure: UPPER ENDOSCOPIC ULTRASOUND (EUS) LINEAR;  Surgeon: Milus Banister, MD;  Location: [facility];  Service: Endoscopy;  Laterality: N/A;  . HERNIA REPAIR     x 2 umbilical   . UMBILICAL HERNIA REPAIR     multiple times    FAMILY HISTORY: Family History  Problem Relation Age of Onset  . Heart disease Mother   . Dementia Father    Mother deceased at the age of 64 secondary to complications of heart disease and diabetes. Father deceased  at the age of 75 secondary to complications of dementia. She has 5 brothers, 4 of which are deceased. She has 3 sisters. She has a daughter 61 years old with multiple medical issues including intracranial hypertension, neuropathy, back issues, and morbid obesity. She has 75 son is 57 years old in good health.  SOCIAL HISTORY:  reports that she quit smoking about 23 years ago. Her smoking use included Cigarettes. She has a 20.00 pack-year smoking history. She has never used smokeless tobacco. She reports that she does not drink alcohol or use drugs.  She denies any illicit drug abuse.  She rarely drinks alcohol.  She does have a 1 pack per day 20 year smoking history quitting 22 years ago.  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  She is Psychologist, forensic and religion.  She is widowed.  Her husband passed away in 2016/12/07 secondary to stage IV pancreatic cancer.  He was not offered chemotherapy given his extent of disease.  Social History   Social History  . Marital status: Married    Spouse name: N/A  . Number of children: N/A  . Years of education: N/A   Social History Main Topics  . Smoking status: Former Smoker    Packs/day: 1.00    Years: 20.00    Types: Cigarettes    Quit date: 03/21/1994  . Smokeless tobacco: Never Used  .  Alcohol use No  . Drug use: No  . Sexual activity: No   Other Topics Concern  . None   Social History Narrative  . None    PERFORMANCE STATUS: The patient's performance status is 1 - Symptomatic but completely ambulatory  PHYSICAL EXAM:  Vitals:   04/13/17 1021  BP: (!) 175/41  Pulse: 62  Resp: 20  Temp: 98.5 F (36.9 C)   Filed Weights   04/13/17 1021  Weight: 220 lb 12.8 oz (100.2 kg)    Physical Exam  Constitutional: She is oriented to person, place, and time and well-developed, well-nourished, and in no distress.  Patient was able to get on the exam table with assistance. Patient uses a cane to ambulate.  HENT:  Head: Normocephalic and atraumatic.  Eyes: Conjunctivae and EOM are normal. Pupils are equal, round, and reactive to light.  Neck: Normal range of motion. Neck supple.  Cardiovascular: Normal rate, regular rhythm and normal heart sounds.   Pulmonary/Chest: Effort normal and breath sounds normal.  Abdominal: Soft. Bowel sounds are normal.  Musculoskeletal: Normal range of motion.  Neurological: She is alert and oriented to person, place, and time. Gait normal.  Skin: Skin is warm and dry.  Nursing note and vitals reviewed.  LABORATORY DATA:  CBC    Component Value Date/Time   WBC 11.7 (H) 03/21/2017 1311   RBC 4.38 03/21/2017 1311   HGB 12.4 03/21/2017 1311   HCT 36.7 03/21/2017 1311   PLT 249 03/21/2017 1311   MCV 83.8 03/21/2017 1311   MCH 28.3 03/21/2017 1311   MCHC 33.8 03/21/2017 1311   RDW 12.9 03/21/2017 1311   LYMPHSABS 3.1 03/21/2017 1311   MONOABS 0.6 03/21/2017 1311   EOSABS 0.2 03/21/2017 1311   BASOSABS 0.1 03/21/2017 1311     Chemistry      Component Value Date/Time   NA 132 (L) 03/21/2017 1311   K 4.6 03/21/2017 1311   CL 93 (L) 03/21/2017 1311   CO2 28 03/21/2017 1311   BUN  12 03/21/2017 1311   CREATININE 0.70 03/21/2017 1311      Component Value Date/Time   CALCIUM 9.3 03/21/2017 1311   ALKPHOS 69 03/21/2017 1311    AST 17 03/21/2017 1311   ALT 14 03/21/2017 1311   BILITOT 0.7 03/21/2017 1311      RADIOGRAPHY: I have personally reviewed the radiological images as listed and agreed with the findings in the report.  03/30/2017    PET Scan 03/27/2017 IMPRESSION: 1. Relatively low-level hypermetabolism corresponding to the pancreatic head mass. 2. Given relatively low-level hypermetabolism within the primary, PET is likely of low sensitivity for metastatic disease. Given this mild limitation, no hypermetabolic metastatic disease identified. 3. Small hiatal hernia 4.  Coronary artery atherosclerosis. Aortic atherosclerosis. 5. Cholelithiasis.  PATHOLOGY:     ASSESSMENT/PLAN:  Stage IB Pancreatic Adenocarcinoma Cancer Staging Pancreatic adenocarcinoma [facility]) Staging form: Exocrine Pancreas, AJCC 8th Edition - Clinical stage from 04/06/2017: Stage IB (cT2, cN0, cM0) - Signed by Twana First, MD on 04/06/2017   She saw Dr. Crisoforo Oxford at [facility] regarding surgical evaluation and he recommended neoadjuvant chemo. I will plan to start patient on neoadjuvant gemzar and abraxane for 4 cycles then repeat restaging scans after 2 cycles to see if she's become resectable.  I discussed that chemo will be given via chemoport every 28 days, day 1, 8 , 15. I educated the patient in detail about the whole chemotherapy treament process including side effects such as being tired, losing taste,  neuropathy, decreased blood counts, nausea, vomiting, and hair loss.  I will also have our nurse navigator perform chemo education for her again prior to starting chemo.  Refer to IR stat for chemoport.   RTC 1 week after first cycle of chemo (3 weeks tentatively)  to evaluate how she is tolerating treatment.    All questions were answered. The patient knows to call the clinic with any problems, questions or concerns. We can certainly see the patient much sooner if necessary.  This document serves as a record of services  personally performed by Twana First, MD. It was created on her behalf by Shirlean Mylar, a trained medical scribe. The creation of this record is based on the scribe's personal observations and the provider's statements to them. This document has been checked and approved by the attending provider.  I have reviewed the above documentation for accuracy and completeness and I agree with the above.  This note is electronically signed by: Mikey College 04/13/2017 10:44 AM

## 2017-04-13 NOTE — Progress Notes (Signed)
START ON PATHWAY REGIMEN - Pancreatic     A cycle is every 28 days:     Nab-paclitaxel (protein bound)      Gemcitabine   **Always confirm dose/schedule in your pharmacy ordering system**    Patient Characteristics: Adenocarcinoma, Borderline Resectable or High Risk, Potentially Resectable, Primary Neoadjuvant Therapy, PS = 0, 1 Histology: Adenocarcinoma Current evidence of distant metastases<= No AJCC T Category: T2 AJCC N Category: N0 AJCC M Category: M0 AJCC 8 Stage Grouping: IB Intent of Therapy: Curative Intent, Discussed with Patient 

## 2017-04-14 NOTE — Patient Instructions (Signed)
[name]  04/14/2017     @PREFPERIOPPHARMACY @   Your procedure is scheduled on   04/21/2017   Report to [facility] at  12  A.M.  Call this number if you have problems the morning of surgery:  [phone]   Remember:  Do not eat food or drink liquids after midnight.  Take these medicines the morning of surgery with A SIP OF WATER  celexa, hydrocodone, ativan, losartan, metoprolol, singulair, prilosec, zofran.   Do not wear jewelry, make-up or nail polish.  Do not wear lotions, powders, or perfumes, or deoderant.  Do not shave 48 hours prior to surgery.  Men may shave face and neck.  Do not bring valuables to the hospital.  [facility] is not responsible for any belongings or valuables.  Contacts, dentures or bridgework may not be worn into surgery.  Leave your suitcase in the car.  After surgery it may be brought to your room.  For patients admitted to the hospital, discharge time will be determined by your treatment team.  Patients discharged the day of surgery will not be allowed to drive home.   Name and phone number of your driver:   family Special instructions:  None  Please read over the following fact sheets that you were given. Anesthesia Post-op Instructions and Care and Recovery After Surgery       Implanted [facility] Guide An implanted port is a type of central line that is placed under the skin. Central lines are used to provide IV access when treatment or nutrition needs to be given through a person's veins. Implanted ports are used for long-term IV access. An implanted port may be placed because:  You need IV medicine that would be irritating to the small veins in your hands or arms.  You need long-term IV medicines, such as antibiotics.  You need IV nutrition for a long period.  You need frequent blood draws for lab tests.  You need dialysis. Implanted ports are usually placed in the chest area, but they can also be  placed in the upper arm, the abdomen, or the leg. An implanted port has two main parts:  Reservoir. The reservoir is round and will appear as a small, raised area under your skin. The reservoir is the part where a needle is inserted to give medicines or draw blood.  Catheter. The catheter is a thin, flexible tube that extends from the reservoir. The catheter is placed into a large vein. Medicine that is inserted into the reservoir goes into the catheter and then into the vein. How will I care for my incision site? Do not get the incision site wet. Bathe or shower as directed by your health care provider. How is my port accessed? Special steps must be taken to access the port:  Before the port is accessed, a numbing cream can be placed on the skin. This helps numb the skin over the port site.  Your health care provider uses a sterile technique to access the port.  Your health care provider must put on a mask and sterile gloves.  The skin over your port is cleaned carefully with an antiseptic and allowed to dry.  The port is gently pinched between sterile gloves, and a needle is inserted into the port.  Only "non-coring" port needles should be used to access the port. Once the port is accessed, a blood return should be  checked. This helps ensure that the port is in the vein and is not clogged.  If your port needs to remain accessed for a constant infusion, a clear (transparent) bandage will be placed over the needle site. The bandage and needle will need to be changed every week, or as directed by your health care provider.  Keep the bandage covering the needle clean and dry. Do not get it wet. Follow your health care provider's instructions on how to take a shower or bath while the port is accessed.  If your port does not need to stay accessed, no bandage is needed over the port. What is flushing? Flushing helps keep the port from getting clogged. Follow your health care provider's  instructions on how and when to flush the port. Ports are usually flushed with saline solution or a medicine called heparin. The need for flushing will depend on how the port is used.  If the port is used for intermittent medicines or blood draws, the port will need to be flushed:  After medicines have been given.  After blood has been drawn.  As part of routine maintenance.  If a constant infusion is running, the port may not need to be flushed. How long will my port stay implanted? The port can stay in for as long as your health care provider thinks it is needed. When it is time for the port to come out, surgery will be done to remove it. The procedure is similar to the one performed when the port was put in. When should I seek immediate medical care? When you have an implanted port, you should seek immediate medical care if:  You notice a bad smell coming from the incision site.  You have swelling, redness, or drainage at the incision site.  You have more swelling or pain at the port site or the surrounding area.  You have a fever that is not controlled with medicine. This information is not intended to replace advice given to you by your health care provider. Make sure you discuss any questions you have with your health care provider. Document Released: 11/28/2005 Document Revised: 05/05/2016 Document Reviewed: 08/05/2013 Elsevier Interactive Patient Education  2017 [place] Insertion Implanted port insertion is a procedure to put in a port and catheter. The port is a device with an injectable disk that can be accessed by your health care provider. The port is connected to a vein in the chest or neck by a small flexible tube (catheter). There are different types of ports. The implanted port may be used as a long-term IV access for:  Medicines, such as chemotherapy.  Fluids.  Liquid nutrition, such as total parenteral nutrition (TPN).  Blood  samples. Having a port means that your health care provider will not need to use the veins in your arms for these procedures. Tell a health care provider about:  Any allergies you have.  All medicines you are taking, especially blood thinners, as well as any vitamins, herbs, eye drops, creams, over-the-counter medicines, and steroids.  Any problems you or family members have had with anesthetic medicines.  Any blood disorders you have.  Any surgeries you have had.  Any medical conditions you have, including diabetes or kidney problems.  Whether you are pregnant or may be pregnant. What are the risks? Generally, this is a safe procedure. However, problems may occur, including:  Allergic reactions to medicines or dyes.  Damage to other structures or organs.  Infection.  Damage to the blood vessel, bruising, or bleeding at the puncture site.  Blood clot.  Breakdown of the skin over the port.  A collection of air in the chest that can cause one of the lungs to collapse (pneumothorax). This is rare. What happens before the procedure? Staying hydrated  Follow instructions from your health care provider about hydration, which may include:  Up to 2 hours before the procedure - you may continue to drink clear liquids, such as water, clear fruit juice, black coffee, and plain tea. Eating and drinking restrictions   Follow instructions from your health care provider about eating and drinking, which may include:  8 hours before the procedure - stop eating heavy meals or foods such as meat, fried foods, or fatty foods.  6 hours before the procedure - stop eating light meals or foods, such as toast or cereal.  6 hours before the procedure - stop drinking milk or drinks that contain milk.  2 hours before the procedure - stop drinking clear liquids. Medicines   Ask your health care provider about:  Changing or stopping your regular medicines. This is especially important if you  are taking diabetes medicines or blood thinners.  Taking medicines such as aspirin and ibuprofen. These medicines can thin your blood. Do not take these medicines before your procedure if your health care provider instructs you not to.  You may be given antibiotic medicine to help prevent infection. General instructions   Plan to have someone take you home from the hospital or clinic.  If you will be going home right after the procedure, plan to have someone with you for 24 hours.  You may have blood tests.  You may be asked to shower with a germ-killing soap. What happens during the procedure?  To lower your risk of infection:  Your health care team will wash or sanitize their hands.  Your skin will be washed with soap.  Hair may be removed from the surgical area.  An IV tube will be inserted into one of your veins.  You will be given one or more of the following:  A medicine to help you relax (sedative).  A medicine to numb the area (local anesthetic).  Two small cuts (incisions) will be made to insert the port.  One incision will be made in your neck to get access to the vein where the catheter will lie.  The other incision will be made in the upper chest. This is where the port will lie.  The procedure may be done using continuous X-ray (fluoroscopy) or other imaging tools for guidance.  The port and catheter will be placed. There may be a small, raised area where the port is.  The port will be flushed with a salt solution (saline), and blood will be drawn to make sure that it is working correctly.  The incisions will be closed.  Bandages (dressings) may be placed over the incisions. The procedure may vary among health care providers and hospitals. What happens after the procedure?  Your blood pressure, heart rate, breathing rate, and blood oxygen level will be monitored until the medicines you were given have worn off.  Do not drive for 24 hours if you were  given a sedative.  You will be given a manufacturer's information card for the type of port that you have. Keep this with you.  Your port will need to be flushed and checked as told by your health care provider, usually every few weeks.  A chest X-ray will be done to:  Check the placement of the port.  Make sure there is no injury to your lung. Summary  Implanted port insertion is a procedure to put in a port and catheter.  The implanted port is used as a long-term IV access.  The port will need to be flushed and checked as told by your health care provider, usually every few weeks.  Keep your manufacturer's information card with you at all times. This information is not intended to replace advice given to you by your health care provider. Make sure you discuss any questions you have with your health care provider. Document Released: 09/18/2013 Document Revised: 10/19/2016 Document Reviewed: 10/19/2016 Elsevier Interactive Patient Education  2017 [place] Insertion, Care After This sheet gives you information about how to care for yourself after your procedure. Your health care provider may also give you more specific instructions. If you have problems or questions, contact your health care provider. What can I expect after the procedure? After your procedure, it is common to have:  Discomfort at the port insertion site.  Bruising on the skin over the port. This should improve over 3-4 days. Follow these instructions at home: [facility] care   After your port is placed, you will get a manufacturer's information card. The card has information about your port. Keep this card with you at all times.  Take care of the port as told by your health care provider. Ask your health care provider if you or a family member can get training for taking care of the port at home. A home health care nurse may also take care of the port.  Make sure to remember what type of port  you have. Incision care   Follow instructions from your health care provider about how to take care of your port insertion site. Make sure you:  Wash your hands with soap and water before you change your bandage (dressing). If soap and water are not available, use hand sanitizer.  Change your dressing as told by your health care provider.  Leave stitches (sutures), skin glue, or adhesive strips in place. These skin closures may need to stay in place for 2 weeks or longer. If adhesive strip edges start to loosen and curl up, you may trim the loose edges. Do not remove adhesive strips completely unless your health care provider tells you to do that.  Check your port insertion site every day for signs of infection. Check for:  More redness, swelling, or pain.  More fluid or blood.  Warmth.  Pus or a bad smell. General instructions   Do not take baths, swim, or use a hot tub until your health care provider approves.  Do not lift anything that is heavier than 10 lb (4.5 kg) for a week, or as told by your health care provider.  Ask your health care provider when it is okay to:  Return to work or school.  Resume usual physical activities or sports.  Do not drive for 24 hours if you were given a medicine to help you relax (sedative).  Take over-the-counter and prescription medicines only as told by your health care provider.  Wear a medical alert bracelet in case of an emergency. This will tell any health care providers that you have a port.  Keep all follow-up visits as told by your health care provider. This is important. Contact a health care provider if:  You cannot flush your port  with saline as directed, or you cannot draw blood from the port.  You have a fever or chills.  You have more redness, swelling, or pain around your port insertion site.  You have more fluid or blood coming from your port insertion site.  Your port insertion site feels warm to the touch.  You  have pus or a bad smell coming from the port insertion site. Get help right away if:  You have chest pain or shortness of breath.  You have bleeding from your port that you cannot control. Summary  Take care of the port as told by your health care provider.  Change your dressing as told by your health care provider.  Keep all follow-up visits as told by your health care provider. This information is not intended to replace advice given to you by your health care provider. Make sure you discuss any questions you have with your health care provider. Document Released: 09/18/2013 Document Revised: 10/19/2016 Document Reviewed: 10/19/2016 Elsevier Interactive Patient Education  2017 [place] Insertion, Care After This sheet gives you information about how to care for yourself after your procedure. Your health care provider may also give you more specific instructions. If you have problems or questions, contact your health care provider. What can I expect after the procedure? After your procedure, it is common to have:  Discomfort at the port insertion site.  Bruising on the skin over the port. This should improve over 3-4 days. Follow these instructions at home: [facility] care   After your port is placed, you will get a manufacturer's information card. The card has information about your port. Keep this card with you at all times.  Take care of the port as told by your health care provider. Ask your health care provider if you or a family member can get training for taking care of the port at home. A home health care nurse may also take care of the port.  Make sure to remember what type of port you have. Incision care   Follow instructions from your health care provider about how to take care of your port insertion site. Make sure you:  Wash your hands with soap and water before you change your bandage (dressing). If soap and water are not available, use hand  sanitizer.  Change your dressing as told by your health care provider.  Leave stitches (sutures), skin glue, or adhesive strips in place. These skin closures may need to stay in place for 2 weeks or longer. If adhesive strip edges start to loosen and curl up, you may trim the loose edges. Do not remove adhesive strips completely unless your health care provider tells you to do that.  Check your port insertion site every day for signs of infection. Check for:  More redness, swelling, or pain.  More fluid or blood.  Warmth.  Pus or a bad smell. General instructions   Do not take baths, swim, or use a hot tub until your health care provider approves.  Do not lift anything that is heavier than 10 lb (4.5 kg) for a week, or as told by your health care provider.  Ask your health care provider when it is okay to:  Return to work or school.  Resume usual physical activities or sports.  Do not drive for 24 hours if you were given a medicine to help you relax (sedative).  Take over-the-counter and prescription medicines only as told by your health care provider.  Wear a medical alert bracelet in case of an emergency. This will tell any health care providers that you have a port.  Keep all follow-up visits as told by your health care provider. This is important. Contact a health care provider if:  You cannot flush your port with saline as directed, or you cannot draw blood from the port.  You have a fever or chills.  You have more redness, swelling, or pain around your port insertion site.  You have more fluid or blood coming from your port insertion site.  Your port insertion site feels warm to the touch.  You have pus or a bad smell coming from the port insertion site. Get help right away if:  You have chest pain or shortness of breath.  You have bleeding from your port that you cannot control. Summary  Take care of the port as told by your health care provider.  Change  your dressing as told by your health care provider.  Keep all follow-up visits as told by your health care provider. This information is not intended to replace advice given to you by your health care provider. Make sure you discuss any questions you have with your health care provider. Document Released: 09/18/2013 Document Revised: 10/19/2016 Document Reviewed: 10/19/2016 Elsevier Interactive Patient Education  2017 [place] Anesthesia is a term that refers to techniques, procedures, and medicines that help a person stay safe and comfortable during a medical procedure. Monitored anesthesia care, or sedation, is one type of anesthesia. Your anesthesia specialist may recommend sedation if you will be having a procedure that does not require you to be unconscious, such as:  Cataract surgery.  A dental procedure.  A biopsy.  A colonoscopy. During the procedure, you may receive a medicine to help you relax (sedative). There are three levels of sedation:  Mild sedation. At this level, you may feel awake and relaxed. You will be able to follow directions.  Moderate sedation. At this level, you will be sleepy. You may not remember the procedure.  Deep sedation. At this level, you will be asleep. You will not remember the procedure. The more medicine you are given, the deeper your level of sedation will be. Depending on how you respond to the procedure, the anesthesia specialist may change your level of sedation or the type of anesthesia to fit your needs. An anesthesia specialist will monitor you closely during the procedure. Let your health care provider know about:  Any allergies you have.  All medicines you are taking, including vitamins, herbs, eye drops, creams, and over-the-counter medicines.  Any use of steroids (by mouth or as a cream).  Any problems you or family members have had with sedatives and anesthetic medicines.  Any blood disorders you  have.  Any surgeries you have had.  Any medical conditions you have, such as sleep apnea.  Whether you are pregnant or may be pregnant.  Any use of cigarettes, alcohol, or street drugs. What are the risks? Generally, this is a safe procedure. However, problems may occur, including:  Getting too much medicine (oversedation).  Nausea.  Allergic reaction to medicines.  Trouble breathing. If this happens, a breathing tube may be used to help with breathing. It will be removed when you are awake and breathing on your own.  Heart trouble.  Lung trouble. Before the procedure Staying hydrated  Follow instructions from your health care provider about hydration, which may include:  Up to 2 hours before  the procedure - you may continue to drink clear liquids, such as water, clear fruit juice, black coffee, and plain tea. Eating and drinking restrictions  Follow instructions from your health care provider about eating and drinking, which may include:  8 hours before the procedure - stop eating heavy meals or foods such as meat, fried foods, or fatty foods.  6 hours before the procedure - stop eating light meals or foods, such as toast or cereal.  6 hours before the procedure - stop drinking milk or drinks that contain milk.  2 hours before the procedure - stop drinking clear liquids. Medicines  Ask your health care provider about:  Changing or stopping your regular medicines. This is especially important if you are taking diabetes medicines or blood thinners.  Taking medicines such as aspirin and ibuprofen. These medicines can thin your blood. Do not take these medicines before your procedure if your health care provider instructs you not to. Tests and exams  You will have a physical exam.  You may have blood tests done to show:  How well your kidneys and liver are working.  How well your blood can clot.  General instructions  Plan to have someone take you home from the  hospital or clinic.  If you will be going home right after the procedure, plan to have someone with you for 24 hours. What happens during the procedure?  Your blood pressure, heart rate, breathing, level of pain and overall condition will be monitored.  An IV tube will be inserted into one of your veins.  Your anesthesia specialist will give you medicines as needed to keep you comfortable during the procedure. This may mean changing the level of sedation.  The procedure will be performed. After the procedure  Your blood pressure, heart rate, breathing rate, and blood oxygen level will be monitored until the medicines you were given have worn off.  Do not drive for 24 hours if you received a sedative.  You may:  Feel sleepy, clumsy, or nauseous.  Feel forgetful about what happened after the procedure.  Have a sore throat if you had a breathing tube during the procedure.  Vomit. This information is not intended to replace advice given to you by your health care provider. Make sure you discuss any questions you have with your health care provider. Document Released: 08/24/2005 Document Revised: 05/06/2016 Document Reviewed: 03/20/2016 Elsevier Interactive Patient Education  2017 [place], Care After These instructions provide you with information about caring for yourself after your procedure. Your health care provider may also give you more specific instructions. Your treatment has been planned according to current medical practices, but problems sometimes occur. Call your health care provider if you have any problems or questions after your procedure. What can I expect after the procedure? After your procedure, it is common to:  Feel sleepy for several hours.  Feel clumsy and have poor balance for several hours.  Feel forgetful about what happened after the procedure.  Have poor judgment for several hours.  Feel nauseous or vomit.  Have a  sore throat if you had a breathing tube during the procedure. Follow these instructions at home: For at least 24 hours after the procedure:    Do not:  Participate in activities in which you could fall or become injured.  Drive.  Use heavy machinery.  Drink alcohol.  Take sleeping pills or medicines that cause drowsiness.  Make important decisions or sign legal documents.  Take care of children on your own.  Rest. Eating and drinking   Follow the diet that is recommended by your health care provider.  If you vomit, drink water, juice, or soup when you can drink without vomiting.  Make sure you have little or no nausea before eating solid foods. General instructions   Have a responsible adult stay with you until you are awake and alert.  Take over-the-counter and prescription medicines only as told by your health care provider.  If you smoke, do not smoke without supervision.  Keep all follow-up visits as told by your health care provider. This is important. Contact a health care provider if:  You keep feeling nauseous or you keep vomiting.  You feel light-headed.  You develop a rash.  You have a fever. Get help right away if:  You have trouble breathing. This information is not intended to replace advice given to you by your health care provider. Make sure you discuss any questions you have with your health care provider. Document Released: 03/20/2016 Document Revised: 07/20/2016 Document Reviewed: 03/20/2016 Elsevier Interactive Patient Education  2017 [employer].

## 2017-04-17 ENCOUNTER — Telehealth (HOSPITAL_COMMUNITY): Payer: Self-pay | Admitting: Emergency Medicine

## 2017-04-17 NOTE — Telephone Encounter (Signed)
Called pt to let her know that she had 2 nausea medications and cream for her port-a-cath at her pharmacy.  Not to use the cream the first time bc it is a fresh port with glue on it and we would give her an ice pack.   Chemotherapy teaching pulled together and appts made.

## 2017-04-17 NOTE — Patient Instructions (Addendum)
[place]   CHEMOTHERAPY INSTRUCTIONS  You have been diagnosed with Stage 1 pancreatic cancer.  You have been seen by Dr Crisoforo Oxford at [facility] and he recommends that you have treatment prior to surgery.  We are going to treat you with gemzar and abraxane.  This treatment is given on day 1, day 8, and day 15, every 28 days.  This means you have treatment for 3 weeks in a row followed by a week off.  1 cycle is 28 days.  You will receive compazine (a nausea pill) as a premedication prior to starting every treatment.  This treatment is with curative intent.  You will have 2 cycles of chemotherapy then we will get restaging scans to see if your are resectable.  You will see the doctor regularly throughout treatment.  We monitor your lab work prior to every treatment.  The doctor monitors your response to treatment by the way you are feeling, your blood work, and scans periodically.    POTENTIAL SIDE EFFECTS OF TREATMENT:  Gemcitabine (Generic Name) Other Names: Gemzar  About This Drug Gemcitabine is used to treat cancer. This drug is given in the vein (IV).  Possible Side Effects (Most Common) . Bone marrow depression. This is a decrease in the number of white blood cells, red blood cells, and platelets. This may raise your risk of infection, make you tired and weak (fatigue), and raise your risk of bleeding. . Nausea and throwing up (vomiting). These symptoms may happen within a few hours after your treatment and may last up to several days. Medicines are available to stop or lessen these side effects. . Loose bowel movements (diarrhea) that may last for a few days . Fluid retention. You may have swelling of your arms, legs, face, chest or abdomen . Raised, red rash on arms, legs, back, or chest . Flu-like symptoms: fever, headache, muscle and joint aches, and fatigue (low energy, feeling weak) . Changes in your liver function. Your doctor will check your liver  function as needed.  Possible Side Effects (Less Common) . Feeling short of breath . Decreased appetite (decreased hunger) . Effects on the nerves are called peripheral neuropathy. You may feel numbness, tingling, or pain in your hands and feet. It may be hard for you to button your clothes, open jars, or walk as usual. The effect on the nerves may get worse with more doses of the drug. These effects get better in some people after the drug is stopped but it does not get better in all people.  Treating Side Effects . Ask your doctor or nurse about medication that is available to help stop or lessen nausea, throwing up, loose bowel movements, headache, muscle and joint aches. . Drink 6-8 cups of fluids each day unless your doctor has told you to limit your fluid intake due to some other health problem. A cup is 8 ounces of fluid. If you throw up or have loose bowel movements you should drink more fluids so that you do not become dehydrated (lack water in the body due to losing too much fluid). . If you get a rash, do not put anything on it unless your doctor or nurse says you may. Keep the area around the rash clean and dry. Ask your doctor for medicine if your rash bothers you. . If you have numbness and tingling in your hands and feet, be careful when cooking, walking, and handling sharp objects and hot liquids.  Food and Drug Interactions There are no known interactions of gemcitabine with food. This drug may interact with other medicines. Tell your doctor and pharmacist about all the medicines and dietary supplements (vitamins, minerals, herbs and others) that you are taking at this time. The safety and use of dietary supplements and alternative diets are often not known. Using these might affect your cancer or interfere with your treatment. Until more is known, you should not use dietary supplements or alternative diets without your cancer doctor's help.  When to Call the  Doctor Call your doctor or nurse right away if you have any of these symptoms: . Fever of 100.5 F (38 C) or above . Chills . Bleeding or bruising that is not normal . Wheezing or trouble breathing . Rash or itching . Nausea that stops you from eating or drinking . Loose bowel movements (diarrhea) more than 4 times a day or diarrhea with weakness or lightheadedness . Swelling of your arms, legs, face, chest or abdomen (fluid retention) Call your doctor or nurse as soon as possible if any of these symptoms happen: . Numbness, tingling, decreased feeling or weakness in fingers, toes, arms, or legs . Trouble walking or changes in the way you walk, feeling clumsy when buttoning clothes, opening jars, or other routine hand motions . Weight gain of 5 pounds in one week (fluid retention) . Lasting loss of appetite or rapid weight loss of five pounds in a week . Fatigue that interferes with your daily activities  Sexual Problems and Reproduction Concerns . Infertility warning: Sexual problems and reproduction concerns may happen. In both men and women, this drug may affect your ability to have children. This cannot be determined before your treatment. Talk with your doctor or nurse if you plan to have children. Ask for information on sperm or egg banking. . In men, this drug may interfere with your ability to make sperm, but it should not change your ability to have sexual relations. . In women, menstrual bleeding may become irregular or stop while you are getting this drug. Do not assume that you cannot become pregnant if you do not have a menstrual period. . Women may go through signs of menopause (change of life) like vaginal dryness or itching. Vaginal lubricants can be used to lessen vaginal dryness, itching, and pain during sexual relations. . Genetic counseling is available for you to talk about the effects of this drug therapy on future pregnancies. Also, a genetic counselor can look  at the possible risk of problems in the unborn baby due to this medicine if an exposure happens during pregnancy. . Pregnancy warning: This drug may have harmful effects on the unborn child, so effective methods of birth control should be used during your cancer treatment. . Breast feeding warning: It is not known if this drug passes into breast milk. For this reason, women should talk to their doctor about the risks and benefits of breast feeding during treatment with this drug because this drug may enter the breast milk and badly harm a breast feeding baby.   Abraxane - myelosuppression (bone marrow suppression - lowers white blood cells, red blood cells, and platelets), sensory neuropathy, muscle and joint aches/pain, nausea/vomiting, diarrhea, mucositis, hair loss )   (takes 30 minutes to infuse)  When To Contact Your Doctor or Health Care Provider: Contact your health care provider immediately, day or night, if you should experience any of the following symptoms: . Fever of 100.4 F (38 C) or higher,  chills (possible signs of infection) The following symptoms require medical attention, but are not an emergency. Contact your health care provider within 24 hours of noticing any of the following: . If you notice any redness or pain at the site of injection.  . Nausea (interferes with ability to eat and unrelieved with prescribed medication).  . Vomiting (vomiting more than 4-5 times in a 24 hour period).  . Diarrhea (4-6 episodes in a 24-hour period).  . Unusual bleeding or bruising  . Black or tarry stools, or blood in your stools.  . Extreme fatigue (unable to carry on self-care activities).  . Mouth sores (painful redness, swelling or ulcers).  . Numbness and tingling of the hands and feet.  . Signs of infection such as redness or swelling, pain on swallowing, coughing up mucous, or painful urination Always inform your health care provider if you experience any unusual  symptoms. Abraxane Precautions:  . Before starting Abraxane treatment, make sure you tell your doctor about any other medications you are taking (including prescription, over-the-counter, vitamins, herbal remedies, etc.).    . Do not receive any kind of immunization or vaccination without your doctor's approval while taking Abraxane.  . Inform your health care professional if you are pregnant or may be pregnant prior to starting this treatment.  Pregnancy category D (Abraxane may be hazardous to the fetus).  Women who are pregnant or become pregnant must be advised of the potential hazard to the fetus.  . For both men and women: Use contraceptives, and do not conceive a child (get pregnant) while taking Abraxane. Barrier methods of contraception, such as condoms, are recommended.  . Do not breast feed while taking Abraxane. Abraxane Self Care Tips: Marland Kitchen Abraxane, or the medications that you take with Abraxane may cause you to feel dizzy or drowsy.  Do not operate any heavy machinery until you know how you respond to Abraxane.  . If you notice any redness or pain at the injection site, place a warm compress, and notify your healthcare provider.  . Drink at least two to three quarts of fluid every 24 hours, unless you are instructed otherwise.  . You may be at risk of infection so try to avoid crowds or people with colds, and report fever or any other signs of infection immediately to your health care provider.  Wendee Copp your hands often.  . To help treat/prevent mouth sores, use a soft toothbrush, and rinse three times a day with 1/2 to 1 teaspoon of baking soda and/or 1/2 to 1 teaspoon of salt mixed with 8 ounces of water.  . Use an electric razor and a soft toothbrush to minimize bleeding.  . Avoid contact sports or activities that could cause injury.  . To reduce nausea, take anti-nausea medications as prescribed by your doctor, and eat small, frequent meals.   . Acetaminophen or ibuprophen may help  relieve discomfort from fever, headache and/or generalized aches and pains.  However, be sure to talk with your doctor before taking it.   . Avoid sun exposure.  Wear SPF 71 (or higher) sunblock and protective clothing.  . In general, drinking alcoholic beverages should be kept to a minimum or avoided completely.  You should discuss this with your doctor.  . Get plenty of rest.  . Maintain good nutrition  . If you experience symptoms or side effects, be sure to discuss them with your health care team.  They can prescribe medications and/or offer other suggestions that are  effective in managing such problems.    EDUCATIONAL MATERIALS GIVEN AND REVIEWED: Chemotherapy and you book given.  Information on gemzar and abraxane given.   SELF CARE ACTIVITIES WHILE ON CHEMOTHERAPY: Hydration Increase your fluid intake 48 hours prior to treatment and drink at least 8 to 12 cups (64 ounces) of water/decaff beverages per day after treatment. You can still have your cup of coffee or soda but these beverages do not count as part of your 8 to 12 cups that you need to drink daily. No alcohol intake.  Medications Continue taking your normal prescription medication as prescribed.  If you start any new herbal or new supplements please let us know first to make sure it is safe.  Mouth Care Have teeth cleaned professionally before starting treatment. Keep dentures and partial plates clean. Use soft toothbrush and do not use mouthwashes that contain alcohol. Biotene is a good mouthwash that is available at most pharmacies or may be ordered by calling [phone]. Use warm salt water gargles (1 teaspoon salt per 1 quart warm water) before and after meals and at bedtime. Or you may rinse with 2 tablespoons of three-percent hydrogen peroxide mixed in eight ounces of water. If you are still having problems with your mouth or sores in your mouth please call the clinic. If you need dental work, please let the doctor  know before you go for your appointment so that we can coordinate the best possible time for you in regards to your chemo regimen. You need to also let your dentist know that you are actively taking chemo. We may need to do labs prior to your dental appointment.   Skin Care Always use sunscreen that has not expired and with SPF (Sun Protection Factor) of 50 or higher. Wear hats to protect your head from the sun. Remember to use sunscreen on your hands, ears, face, & feet.  Use good moisturizing lotions such as udder cream, eucerin, or even Vaseline. Some chemotherapies can cause dry skin, color changes in your skin and nails.    . Avoid long, hot showers or baths. . Use gentle, fragrance-free soaps and laundry detergent. . Use moisturizers, preferably creams or ointments rather than lotions because the thicker consistency is better at preventing skin dehydration. Apply the cream or ointment within 15 minutes of showering. Reapply moisturizer at night, and moisturize your hands every time after you wash them.  Hair Loss (if your doctor says your hair will fall out)  . If your doctor says that your hair is likely to fall out, decide before you begin chemo whether you want to wear a wig. You may want to shop before treatment to match your hair color. . Hats, turbans, and scarves can also camouflage hair loss, although some people prefer to leave their heads uncovered. If you go bare-headed outdoors, be sure to use sunscreen on your scalp. . Cut your hair short. It eases the inconvenience of shedding lots of hair, but it also can reduce the emotional impact of watching your hair fall out. . Don't perm or color your hair during chemotherapy. Those chemical treatments are already damaging to hair and can enhance hair loss. Once your chemo treatments are done and your hair has grown back, it's OK to resume dyeing or perming hair. With chemotherapy, hair loss is almost always temporary. But when it grows  back, it may be a different color or texture. In older adults who still had hair color before chemotherapy, the new growth  may be completely gray.  Often, new hair is very fine and soft.  Infection Prevention Please wash your hands for at least 30 seconds using warm soapy water. Handwashing is the #1 way to prevent the spread of germs. Stay away from sick people or people who are getting over a cold. If you develop respiratory systems such as green/yellow mucus production or productive cough or persistent cough let us know and we will see if you need an antibiotic. It is a good idea to keep a pair of gloves on when going into grocery stores/[employer] to decrease your risk of coming into contact with germs on the carts, etc. Carry alcohol hand gel with you at all times and use it frequently if out in public. If your temperature reaches 100.5 or higher please call the clinic and let us know.  If it is after hours or on the weekend please go to the ER if your temperature is over 100.5.  Please have your own personal thermometer at home to use.    Sex and bodily fluids If you are going to have sex, a condom must be used to protect the person that isn't taking chemotherapy. Chemo can decrease your libido (sex drive). For a few days after chemotherapy, chemotherapy can be excreted through your bodily fluids.  When using the toilet please close the lid and flush the toilet twice.  Do this for a few day after you have had chemotherapy.    Effects of chemotherapy on your sex life Some changes are simple and won't last long. They won't affect your sex life permanently. Sometimes you may feel: . too tired . not strong enough to be very active . sick or sore  . not in the mood . anxious or low Your anxiety might not seem related to sex. For example, you may be worried about the cancer and how your treatment is going. Or you may be worried about money, or about how you family are coping with your illness. These  things can cause stress, which can affect your interest in sex. It's important to talk to your partner about how you feel. Remember - the changes to your sex life don't usually last long. There's usually no medical reason to stop having sex during chemo. The drugs won't have any long term physical effects on your performance or enjoyment of sex. Cancer can't be passed on to your partner during sex  Contraception It's important to use reliable contraception during treatment. Avoid getting pregnant while you or your partner are having chemotherapy. This is because the drugs may harm the baby. Sometimes chemotherapy drugs can leave a man or woman infertile.  This means you would not be able to have children in the future. You might want to talk to someone about permanent infertility. It can be very difficult to learn that you may no longer be able to have children. Some people find counselling helpful. There might be ways to preserve your fertility, although this is easier for men than for women. You may want to speak to a fertility expert. You can talk about sperm banking or harvesting your eggs. You can also ask about other fertility options, such as donor eggs. If you have or have had breast cancer, your doctor might advise you not to take the contraceptive pill. This is because the hormones in it might affect the cancer.  It is not known for sure whether or not chemotherapy drugs can be passed on through semen or  secretions from the vagina. Because of this some doctors advise people to use a barrier method if you have sex during treatment. This applies to vaginal, anal or oral sex. Generally, doctors advise a barrier method only for the time you are actually having the treatment and for about a week after your treatment. Advice like this can be worrying, but this does not mean that you have to avoid being intimate with your partner. You can still have close contact with your partner and continue to enjoy  sex.  Animals If you have cats or birds we just ask that you not change the litter or change the cage.  Please have someone else do this for you while you are on chemotherapy.   Food Safety During and After Cancer Treatment Food safety is important for people both during and after cancer treatment. Cancer and cancer treatments, such as chemotherapy, radiation therapy, and stem cell/bone marrow transplantation, often weaken the immune system. This makes it harder for your body to protect itself from foodborne illness, also called food poisoning. Foodborne illness is caused by eating food that contains harmful bacteria, parasites, or viruses.  Foods to avoid Some foods have a higher risk of becoming tainted with bacteria. These include: Marland Kitchen Unwashed fresh fruit and vegetables, especially leafy vegetables that can hide dirt and other contaminants . Raw sprouts, such as alfalfa sprouts . Raw or undercooked beef, especially ground beef, or other raw or undercooked meat and poultry . Fatty, fried, or spicy foods immediately before or after treatment.  These can sit heavy on your stomach and make you feel nauseous. . Raw or undercooked shellfish, such as oysters. . Sushi and sashimi, which often contain raw fish.  . Unpasteurized beverages, such as unpasteurized fruit juices, raw milk, raw yogurt, or cider . Undercooked eggs, such as soft boiled, over easy, and poached; raw, unpasteurized eggs; or foods made with raw egg, such as homemade raw cookie dough and homemade mayonnaise Simple steps for food safety Shop smart. . Do not buy food stored or displayed in an unclean area. . Do not buy bruised or damaged fruits or vegetables. . Do not buy cans that have cracks, dents, or bulges. . Pick up foods that can spoil at the end of your shopping trip and store them in a cooler on the way home. Prepare and clean up foods carefully. . Rinse all fresh fruits and vegetables under running water, and dry them  with a clean towel or paper towel. . Clean the top of cans before opening them. . After preparing food, wash your hands for 20 seconds with hot water and soap. Pay special attention to areas between fingers and under nails. . Clean your utensils and dishes with hot water and soap. Marland Kitchen Disinfect your kitchen and cutting boards using 1 teaspoon of liquid, unscented bleach mixed into 1 quart of water.   Dispose of old food. . Eat canned and packaged food before its expiration date (the "use by" or "best before" date). . Consume refrigerated leftovers within 3 to 4 days. After that time, throw out the food. Even if the food does not smell or look spoiled, it still may be unsafe. Some bacteria, such as Listeria, can grow even on foods stored in the refrigerator if they are kept for too long. Take precautions when eating out. . At restaurants, avoid buffets and salad bars where food sits out for a long time and comes in contact with many people. Food can become contaminated  when someone with a virus, often a norovirus, or another "bug" handles it. . Put any leftover food in a "to-go" container yourself, rather than having the server do it. And, refrigerate leftovers as soon as you get home. . Choose restaurants that are clean and that are willing to prepare your food as you order it cooked.    MEDICATIONS:                                                                                                                                                              Zofran/Ondansetron 8mg  tablet. Take 1 tablet every 8 hours as needed for nausea/vomiting. (#1 nausea med to take, this can constipate)  Ativan/Lorazepam 0.5 mg tablet.  Take 1 tablet every 6 hours as needed for nausea/vomiting. (#2 nausea med to take, this can make you sleepy)   EMLA cream. Apply a quarter size amount to port site 1 hour prior to chemo. Do not rub in. Cover with plastic wrap.   Over-the-Counter Meds:  Miralax 1 capful in 8 oz  of fluid daily. May increase to two times a day if needed. This is a stool softener. If this doesn't work proceed you can add:  Senokot S-start with 1 tablet two times a day and increase to 4 tablets two times a day if needed. (total of 8 tablets in a 24 hour period). This is a stimulant laxative.   Call us if this does not help your bowels move.   Imodium 2mg  capsule. Take 2 capsules after the 1st loose stool and then 1 capsule every 2 hours until you go a total of 12 hours without having a loose stool. Call the [place] if loose stools continue. If diarrhea occurs @ bedtime, take 2 capsules @ bedtime. Then take 2 capsules every 4 hours until morning. Call [place].    Constipation Sheet *Miralax in 8 oz of fluid daily.  May increase to two times a day if needed.  This is a stool softener.  If this not enough to keep your bowel regular:  You can add:  *Senokot S, start with one tablet twice a day and can increase to 4 tablets twice a day if needed.  This is a stimulant laxative.   Sometimes when you take pain medication you need BOTH a medicine to keep your stool soft and a medicine to help your bowel push it out!  Please call if the above does not work for you.   Do not go more than 2 days without a bowel movement.  It is very important that you do not become constipated.  It will make you feel sick to your stomach (nausea) and can cause abdominal pain and vomiting.    Diarrhea Sheet  If you are having loose stools/diarrhea,  please purchase Imodium and begin taking as outlined:  At the first sign of poorly formed or loose stools you should begin taking Imodium(loperamide) 2 mg capsules.  Take two caplets (4mg ) followed by one caplet (2mg ) every 2 hours until you have had no diarrhea for 12 hours.  During the night take two caplets (4mg ) at bedtime and continue every 4 hours during the night until the morning.  Stop taking Imodium only after there is no sign of diarrhea for 12  hours.    Always call the [place] if you are having loose stools/diarrhea that you can't get under control.  Loose stools/disrrhea leads to dehydration (loss of water) in your body.  We have other options of trying to get the loose stools/diarrhea to stopped but you must let us know!    Nausea Sheet  Zofran/Ondansetron 8mg  tablet. Take 1 tablet every 8 hours as needed for nausea/vomiting. (#1 nausea med to take, this can constipate)  Ativan/Lorazepam 0.5 mg tablet.  Take 1 tablet every 6 hours as needed for nausea/vomiting. (#2 nausea med to take, this can make you sleepy)  You can take these medications together or separately.  We would first like for you to try the Ondansetron by itself and then take the Prochloperizine if needed. But you are allowed to take both medications at the same time if your nausea is that severe.  If you are having persistent nausea (nausea that does not stop) please take these medications on a staggered schedule so that the nausea medication stays in your body.  Please call the [facility] and let us know the amount of nausea that you are experiencing.  If you begin to vomit, you need to call the [facility] and if it is the weekend and you have vomited more than one time and cant get it to stop-go to the Emergency Room.  Persistent nausea/vomiting can lead to dehydration (loss of fluid in your body) and will make you feel terrible.   Ice chips, sips of clear liquids, foods that are @ room temperature, crackers, and toast tend to be better tolerated.    SYMPTOMS TO REPORT AS SOON AS POSSIBLE AFTER TREATMENT:  FEVER GREATER THAN 100.5 F  CHILLS WITH OR WITHOUT FEVER  NAUSEA AND VOMITING THAT IS NOT CONTROLLED WITH YOUR NAUSEA MEDICATION  UNUSUAL SHORTNESS OF BREATH  UNUSUAL BRUISING OR BLEEDING  TENDERNESS IN MOUTH AND THROAT WITH OR WITHOUT PRESENCE OF ULCERS  URINARY PROBLEMS  BOWEL PROBLEMS  UNUSUAL RASH    Wear comfortable clothing and  clothing appropriate for easy access to any Portacath or PICC line. Let us know if there is anything that we can do to make your therapy better!    What to do if you need assistance after hours or on the weekends: CALL [phone].  HOLD on the line, do not hang up.  You will hear multiple messages but at the end you will be connected with a nurse triage line.  They will contact the doctor if necessary.  Most of the time they will be able to assist you.  Do not call the hospital operator.      I have been informed and understand all of the instructions given to me and have received a copy. I have been instructed to call the clinic [phone] or my family physician as soon as possible for continued medical care, if indicated. I do not have any more questions at this time but understand that I may  call the [facility] or the Patient Navigator at [phone] during office hours should I have questions or need assistance in obtaining follow-up care.

## 2017-04-18 ENCOUNTER — Encounter (HOSPITAL_COMMUNITY): Payer: Self-pay

## 2017-04-18 ENCOUNTER — Ambulatory Visit (INDEPENDENT_AMBULATORY_CARE_PROVIDER_SITE_OTHER): Payer: Medicare Other | Admitting: General Surgery

## 2017-04-18 ENCOUNTER — Encounter: Payer: Self-pay | Admitting: General Surgery

## 2017-04-18 ENCOUNTER — Encounter (HOSPITAL_COMMUNITY)
Admission: RE | Admit: 2017-04-18 | Discharge: 2017-04-18 | Disposition: A | Discharge status: Home / Self Care | Payer: Medicare Other | Source: Ambulatory Visit | Attending: General Surgery | Admitting: General Surgery

## 2017-04-18 ENCOUNTER — Other Ambulatory Visit: Payer: Self-pay

## 2017-04-18 VITALS — BP 168/82 | HR 70 | Temp 98.2°F | Resp 18 | Ht 62.0 in | Wt 219.0 lb

## 2017-04-18 DIAGNOSIS — Z9989 Dependence on other enabling machines and devices: Secondary | ICD-10-CM | POA: Diagnosis not present

## 2017-04-18 DIAGNOSIS — E7800 Pure hypercholesterolemia, unspecified: Secondary | ICD-10-CM | POA: Diagnosis not present

## 2017-04-18 DIAGNOSIS — Z87891 Personal history of nicotine dependence: Secondary | ICD-10-CM | POA: Diagnosis not present

## 2017-04-18 DIAGNOSIS — Z6841 Body Mass Index (BMI) 40.0 and over, adult: Secondary | ICD-10-CM | POA: Diagnosis not present

## 2017-04-18 DIAGNOSIS — C259 Malignant neoplasm of pancreas, unspecified: Secondary | ICD-10-CM | POA: Diagnosis present

## 2017-04-18 DIAGNOSIS — I10 Essential (primary) hypertension: Secondary | ICD-10-CM | POA: Diagnosis not present

## 2017-04-18 DIAGNOSIS — Z79899 Other long term (current) drug therapy: Secondary | ICD-10-CM | POA: Diagnosis not present

## 2017-04-18 DIAGNOSIS — G4730 Sleep apnea, unspecified: Secondary | ICD-10-CM | POA: Diagnosis not present

## 2017-04-18 DIAGNOSIS — Z7984 Long term (current) use of oral hypoglycemic drugs: Secondary | ICD-10-CM | POA: Diagnosis not present

## 2017-04-18 DIAGNOSIS — F329 Major depressive disorder, single episode, unspecified: Secondary | ICD-10-CM | POA: Diagnosis not present

## 2017-04-18 DIAGNOSIS — K219 Gastro-esophageal reflux disease without esophagitis: Secondary | ICD-10-CM | POA: Diagnosis not present

## 2017-04-18 DIAGNOSIS — E1140 Type 2 diabetes mellitus with diabetic neuropathy, unspecified: Secondary | ICD-10-CM | POA: Diagnosis not present

## 2017-04-18 DIAGNOSIS — Z7982 Long term (current) use of aspirin: Secondary | ICD-10-CM | POA: Diagnosis not present

## 2017-04-18 DIAGNOSIS — J449 Chronic obstructive pulmonary disease, unspecified: Secondary | ICD-10-CM | POA: Diagnosis not present

## 2017-04-18 HISTORY — DX: Malignant (primary) neoplasm, unspecified: C80.1

## 2017-04-18 HISTORY — DX: Dyspnea, unspecified: R06.00

## 2017-04-18 LAB — SURGICAL PCR SCREEN
MRSA, PCR: POSITIVE — AB
STAPHYLOCOCCUS AUREUS: POSITIVE — AB

## 2017-04-18 NOTE — Progress Notes (Signed)
Renee Jackson; [id number]; 1946-12-09   HPI Patient is a 71 year old white female with pancreatic cancer who was referred by oncology for Port-A-Cath insertion. She currently has no pain. Past Medical History:  Diagnosis Date  . Arthritis    hands  . Asthma   . Atrial fibrillation ([place])   . Chronic lower back pain   . COPD (chronic obstructive pulmonary disease) ([place])   . Depression   . Diabetes ([place])    type 2  . Diabetic neuropathy (HCC)    feet and legs  . GERD (gastroesophageal reflux disease)   . Headache    migraines  . Hypercholesteremia   . Hypertension   . Neuropathy    both feet  . Pancreatic mass 03/21/2017  . Pancreatic mass   . Pneumonia 1995 severe case   2 times before that  . Seizures ([place]) as child  to age 67   with high fevers  . Sleep apnea    cpap uses sometimes  . Umbilical hernia     Past Surgical History:  Procedure Laterality Date  . CESAREAN SECTION     x 1  . EUS N/A 03/30/2017   Procedure: UPPER ENDOSCOPIC ULTRASOUND (EUS) LINEAR;  Surgeon: Milus Banister, MD;  Location: [facility];  Service: Endoscopy;  Laterality: N/A;  . HERNIA REPAIR     x 2 umbilical   . UMBILICAL HERNIA REPAIR     multiple times    Family History  Problem Relation Age of Onset  . Heart disease Mother   . Dementia Father     Current Outpatient Prescriptions on File Prior to Visit  Medication Sig Dispense Refill  . aspirin EC 81 MG tablet Take 81 mg by mouth daily.    Marland Kitchen atorvastatin (LIPITOR) 10 MG tablet Take 10 mg by mouth every morning.     . cetirizine (ZYRTEC) 10 MG tablet Take 10 mg by mouth at bedtime.    . citalopram (CELEXA) 20 MG tablet Take 20 mg by mouth every morning.     Marland Kitchen glipiZIDE (GLUCOTROL XL) 10 MG 24 hr tablet Take 10 mg by mouth daily with breakfast.     . hydrochlorothiazide (HYDRODIURIL) 25 MG tablet Take 25 mg by mouth at bedtime.     Marland Kitchen HYDROcodone-acetaminophen (NORCO/VICODIN) 5-325 MG tablet Take 0.5 tablets by mouth every 6  (six) hours as needed for moderate pain.     Marland Kitchen lidocaine-prilocaine (EMLA) cream Apply to affected area once 30 g 3  . LORazepam (ATIVAN) 0.5 MG tablet Take 1 tablet (0.5 mg total) by mouth every 6 (six) hours as needed (Nausea or vomiting). 30 tablet 0  . losartan (COZAAR) 100 MG tablet Take 100 mg by mouth every morning.     . metFORMIN (GLUCOPHAGE) 1000 MG tablet Take 1,000 mg by mouth 2 (two) times daily with a meal.     . metoprolol succinate (TOPROL-XL) 50 MG 24 hr tablet Take 50 mg by mouth every morning.     . montelukast (SINGULAIR) 10 MG tablet Take 10 mg by mouth every morning.     . Multiple Vitamins-Minerals (MULTIVITAMIN GUMMIES ADULT) CHEW Chew 2 tablets by mouth daily.    Marland Kitchen omeprazole (PRILOSEC) 20 MG capsule Take 40 mg by mouth every morning.     . ondansetron (ZOFRAN) 8 MG tablet Take 1 tablet (8 mg total) by mouth 2 (two) times daily as needed (Nausea or vomiting). 30 tablet 1  . promethazine (PHENERGAN) 25 MG tablet Take 25 mg by  mouth every 6 (six) hours as needed for nausea or vomiting.     . Simethicone (ALKA-SELTZER ANTI-GAS PO) Take 1 tablet by mouth daily as needed (gas).     No current facility-administered medications on file prior to visit.     Allergies  Allergen Reactions  . Sulfa Antibiotics     itching    History  Alcohol Use No    History  Smoking Status  . Former Smoker  . Packs/day: 1.00  . Years: 20.00  . Types: Cigarettes  . Quit date: 03/21/1994  Smokeless Tobacco  . Never Used    Review of Systems  Constitutional: Positive for malaise/fatigue.  HENT: Positive for sinus pain.   Eyes: Positive for blurred vision and pain.  Respiratory: Positive for cough and shortness of breath.   Cardiovascular: Negative.   Gastrointestinal: Positive for abdominal pain and heartburn. Negative for nausea.  Genitourinary:       Urinary retention  Musculoskeletal: Positive for back pain, joint pain and neck pain.  Skin: Negative.   Neurological:  Positive for headaches.  Endo/Heme/Allergies: Negative.   Psychiatric/Behavioral: Negative.     Objective   Vitals:   04/18/17 0937  BP: (!) 168/82  Pulse: 70  Resp: 18  Temp: 98.2 F (36.8 C)    Physical Exam  Constitutional: She is oriented to person, place, and time and well-developed, well-nourished, and in no distress.  HENT:  Head: Normocephalic and atraumatic.  Neck: Normal range of motion. Neck supple.  Cardiovascular: Normal rate, regular rhythm and normal heart sounds.   No murmur heard. Pulmonary/Chest: Effort normal and breath sounds normal. She has no wheezes. She has no rales.  Neurological: She is alert and oriented to person, place, and time.  Skin: Skin is warm and dry.  Vitals reviewed.  Oncology notes reviewed. Assessment  Pancreatic carcinoma, need for central venous access Plan   Scheduled for Port-A-Cath insertion on 04/21/2017. The risks and benefits of the procedure including bleeding, infection, and pneumothorax were fully explained to the patient, who gave informed consent.

## 2017-04-18 NOTE — H&P (Signed)
Renee Jackson; [id number]; 24-Dec-1945   HPI Patient is a 71 year old white female with pancreatic cancer who was referred by oncology for Port-A-Cath insertion. She currently has no pain.     Past Medical History:  Diagnosis Date  . Arthritis    hands  . Asthma   . Atrial fibrillation ([place])   . Chronic lower back pain   . COPD (chronic obstructive pulmonary disease) ([place])   . Depression   . Diabetes ([place])    type 2  . Diabetic neuropathy (HCC)    feet and legs  . GERD (gastroesophageal reflux disease)   . Headache    migraines  . Hypercholesteremia   . Hypertension   . Neuropathy    both feet  . Pancreatic mass 03/21/2017  . Pancreatic mass   . Pneumonia 1995 severe case   2 times before that  . Seizures ([place]) as child  to age 33   with high fevers  . Sleep apnea    cpap uses sometimes  . Umbilical hernia          Past Surgical History:  Procedure Laterality Date  . CESAREAN SECTION     x 1  . EUS N/A 03/30/2017   Procedure: UPPER ENDOSCOPIC ULTRASOUND (EUS) LINEAR;  Surgeon: Milus Banister, MD;  Location: [facility];  Service: Endoscopy;  Laterality: N/A;  . HERNIA REPAIR     x 2 umbilical   . UMBILICAL HERNIA REPAIR     multiple times         Family History  Problem Relation Age of Onset  . Heart disease Mother   . Dementia Father           Current Outpatient Prescriptions on File Prior to Visit  Medication Sig Dispense Refill  . aspirin EC 81 MG tablet Take 81 mg by mouth daily.    Marland Kitchen atorvastatin (LIPITOR) 10 MG tablet Take 10 mg by mouth every morning.     . cetirizine (ZYRTEC) 10 MG tablet Take 10 mg by mouth at bedtime.    . citalopram (CELEXA) 20 MG tablet Take 20 mg by mouth every morning.     Marland Kitchen glipiZIDE (GLUCOTROL XL) 10 MG 24 hr tablet Take 10 mg by mouth daily with breakfast.     . hydrochlorothiazide (HYDRODIURIL) 25 MG tablet Take 25 mg by mouth at bedtime.     Marland Kitchen  HYDROcodone-acetaminophen (NORCO/VICODIN) 5-325 MG tablet Take 0.5 tablets by mouth every 6 (six) hours as needed for moderate pain.     Marland Kitchen lidocaine-prilocaine (EMLA) cream Apply to affected area once 30 g 3  . LORazepam (ATIVAN) 0.5 MG tablet Take 1 tablet (0.5 mg total) by mouth every 6 (six) hours as needed (Nausea or vomiting). 30 tablet 0  . losartan (COZAAR) 100 MG tablet Take 100 mg by mouth every morning.     . metFORMIN (GLUCOPHAGE) 1000 MG tablet Take 1,000 mg by mouth 2 (two) times daily with a meal.     . metoprolol succinate (TOPROL-XL) 50 MG 24 hr tablet Take 50 mg by mouth every morning.     . montelukast (SINGULAIR) 10 MG tablet Take 10 mg by mouth every morning.     . Multiple Vitamins-Minerals (MULTIVITAMIN GUMMIES ADULT) CHEW Chew 2 tablets by mouth daily.    Marland Kitchen omeprazole (PRILOSEC) 20 MG capsule Take 40 mg by mouth every morning.     . ondansetron (ZOFRAN) 8 MG tablet Take 1 tablet (8 mg total) by mouth 2 (two) times  daily as needed (Nausea or vomiting). 30 tablet 1  . promethazine (PHENERGAN) 25 MG tablet Take 25 mg by mouth every 6 (six) hours as needed for nausea or vomiting.     . Simethicone (ALKA-SELTZER ANTI-GAS PO) Take 1 tablet by mouth daily as needed (gas).     No current facility-administered medications on file prior to visit.          Allergies  Allergen Reactions  . Sulfa Antibiotics     itching       History  Alcohol Use No        History  Smoking Status  . Former Smoker  . Packs/day: 1.00  . Years: 20.00  . Types: Cigarettes  . Quit date: 03/21/1994  Smokeless Tobacco  . Never Used    Review of Systems  Constitutional: Positive for malaise/fatigue.  HENT: Positive for sinus pain.   Eyes: Positive for blurred vision and pain.  Respiratory: Positive for cough and shortness of breath.   Cardiovascular: Negative.   Gastrointestinal: Positive for abdominal pain and heartburn. Negative for nausea.   Genitourinary:       Urinary retention  Musculoskeletal: Positive for back pain, joint pain and neck pain.  Skin: Negative.   Neurological: Positive for headaches.  Endo/Heme/Allergies: Negative.   Psychiatric/Behavioral: Negative.     Objective      Vitals:   04/18/17 0937  BP: (!) 168/82  Pulse: 70  Resp: 18  Temp: 98.2 F (36.8 C)    Physical Exam  Constitutional: She is oriented to person, place, and time and well-developed, well-nourished, and in no distress.  HENT:  Head: Normocephalic and atraumatic.  Neck: Normal range of motion. Neck supple.  Cardiovascular: Normal rate, regular rhythm and normal heart sounds.   No murmur heard. Pulmonary/Chest: Effort normal and breath sounds normal. She has no wheezes. She has no rales.  Neurological: She is alert and oriented to person, place, and time.  Skin: Skin is warm and dry.  Vitals reviewed.  Oncology notes reviewed. Assessment  Pancreatic carcinoma, need for central venous access Plan   Scheduled for Port-A-Cath insertion on 04/21/2017. The risks and benefits of the procedure including bleeding, infection, and pneumothorax were fully explained to the patient, who gave informed consent.

## 2017-04-18 NOTE — Patient Instructions (Signed)
Implanted Port Insertion  Implanted port insertion is a procedure to put in a port and catheter. The port is a device with an injectable disk that can be accessed by your health care provider. The port is connected to a vein in the chest or neck by a small flexible tube (catheter). There are different types of ports. The implanted port may be used as a long-term IV access for:  · Medicines, such as chemotherapy.  · Fluids.  · Liquid nutrition, such as total parenteral nutrition (TPN).  · Blood samples.    Having a port means that your health care provider will not need to use the veins in your arms for these procedures.  Tell a health care provider about:  · Any allergies you have.  · All medicines you are taking, especially blood thinners, as well as any vitamins, herbs, eye drops, creams, over-the-counter medicines, and steroids.  · Any problems you or family members have had with anesthetic medicines.  · Any blood disorders you have.  · Any surgeries you have had.  · Any medical conditions you have, including diabetes or kidney problems.  · Whether you are pregnant or may be pregnant.  What are the risks?  Generally, this is a safe procedure. However, problems may occur, including:  · Allergic reactions to medicines or dyes.  · Damage to other structures or organs.  · Infection.  · Damage to the blood vessel, bruising, or bleeding at the puncture site.  · Blood clot.  · Breakdown of the skin over the port.  · A collection of air in the chest that can cause one of the lungs to collapse (pneumothorax). This is rare.    What happens before the procedure?  Staying hydrated  Follow instructions from your health care provider about hydration, which may include:  · Up to 2 hours before the procedure - you may continue to drink clear liquids, such as water, clear fruit juice, black coffee, and plain tea.    Eating and drinking restrictions  · Follow instructions from your health care provider about eating and drinking,  which may include:  ? 8 hours before the procedure - stop eating heavy meals or foods such as meat, fried foods, or fatty foods.  ? 6 hours before the procedure - stop eating light meals or foods, such as toast or cereal.  ? 6 hours before the procedure - stop drinking milk or drinks that contain milk.  ? 2 hours before the procedure - stop drinking clear liquids.  Medicines  · Ask your health care provider about:  ? Changing or stopping your regular medicines. This is especially important if you are taking diabetes medicines or blood thinners.  ? Taking medicines such as aspirin and ibuprofen. These medicines can thin your blood. Do not take these medicines before your procedure if your health care provider instructs you not to.  · You may be given antibiotic medicine to help prevent infection.  General instructions  · Plan to have someone take you home from the hospital or clinic.  · If you will be going home right after the procedure, plan to have someone with you for 24 hours.  · You may have blood tests.  · You may be asked to shower with a germ-killing soap.  What happens during the procedure?  · To lower your risk of infection:  ? Your health care team will wash or sanitize their hands.  ? Your skin will be washed with   soap.  ? Hair may be removed from the surgical area.  · An IV tube will be inserted into one of your veins.  · You will be given one or more of the following:  ? A medicine to help you relax (sedative).  ? A medicine to numb the area (local anesthetic).  · Two small cuts (incisions) will be made to insert the port.  ? One incision will be made in your neck to get access to the vein where the catheter will lie.  ? The other incision will be made in the upper chest. This is where the port will lie.  · The procedure may be done using continuous X-ray (fluoroscopy) or other imaging tools for guidance.  · The port and catheter will be placed. There may be a small, raised area where the port  is.  · The port will be flushed with a salt solution (saline), and blood will be drawn to make sure that it is working correctly.  · The incisions will be closed.  · Bandages (dressings) may be placed over the incisions.  The procedure may vary among health care providers and hospitals.  What happens after the procedure?  · Your blood pressure, heart rate, breathing rate, and blood oxygen level will be monitored until the medicines you were given have worn off.  · Do not drive for 24 hours if you were given a sedative.  · You will be given a manufacturer's information card for the type of port that you have. Keep this with you.  · Your port will need to be flushed and checked as told by your health care provider, usually every few weeks.  · A chest X-ray will be done to:  ? Check the placement of the port.  ? Make sure there is no injury to your lung.  Summary  · Implanted port insertion is a procedure to put in a port and catheter.  · The implanted port is used as a long-term IV access.  · The port will need to be flushed and checked as told by your health care provider, usually every few weeks.  · Keep your manufacturer's information card with you at all times.  This information is not intended to replace advice given to you by your health care provider. Make sure you discuss any questions you have with your health care provider.  Document Released: 09/18/2013 Document Revised: 10/19/2016 Document Reviewed: 10/19/2016  Elsevier Interactive Patient Education © 2017 [employer]

## 2017-04-19 ENCOUNTER — Ambulatory Visit (HOSPITAL_COMMUNITY): Payer: Medicare Other

## 2017-04-19 MED ORDER — MUPIROCIN 2 % EX OINT
TOPICAL_OINTMENT | CUTANEOUS | Status: AC
  Filled 2017-04-19: qty 22

## 2017-04-21 ENCOUNTER — Ambulatory Visit (HOSPITAL_COMMUNITY)
Admission: RE | Admit: 2017-04-21 | Discharge: 2017-04-21 | Disposition: A | Discharge status: Home / Self Care | Payer: Medicare Other | Source: Ambulatory Visit | Attending: General Surgery | Admitting: General Surgery

## 2017-04-21 ENCOUNTER — Ambulatory Visit (HOSPITAL_COMMUNITY): Payer: Medicare Other

## 2017-04-21 ENCOUNTER — Encounter (HOSPITAL_COMMUNITY): Payer: Self-pay | Admitting: *Deleted

## 2017-04-21 ENCOUNTER — Encounter (HOSPITAL_COMMUNITY)
Admission: RE | Disposition: A | Discharge status: Home / Self Care | Payer: Self-pay | Source: Ambulatory Visit | Attending: General Surgery

## 2017-04-21 ENCOUNTER — Ambulatory Visit (HOSPITAL_COMMUNITY): Payer: Medicare Other | Admitting: Anesthesiology

## 2017-04-21 DIAGNOSIS — E1140 Type 2 diabetes mellitus with diabetic neuropathy, unspecified: Secondary | ICD-10-CM | POA: Insufficient documentation

## 2017-04-21 DIAGNOSIS — J449 Chronic obstructive pulmonary disease, unspecified: Secondary | ICD-10-CM | POA: Insufficient documentation

## 2017-04-21 DIAGNOSIS — Z7984 Long term (current) use of oral hypoglycemic drugs: Secondary | ICD-10-CM | POA: Insufficient documentation

## 2017-04-21 DIAGNOSIS — Z87891 Personal history of nicotine dependence: Secondary | ICD-10-CM | POA: Insufficient documentation

## 2017-04-21 DIAGNOSIS — F329 Major depressive disorder, single episode, unspecified: Secondary | ICD-10-CM | POA: Insufficient documentation

## 2017-04-21 DIAGNOSIS — C259 Malignant neoplasm of pancreas, unspecified: Secondary | ICD-10-CM | POA: Insufficient documentation

## 2017-04-21 DIAGNOSIS — G4730 Sleep apnea, unspecified: Secondary | ICD-10-CM | POA: Insufficient documentation

## 2017-04-21 DIAGNOSIS — I10 Essential (primary) hypertension: Secondary | ICD-10-CM | POA: Insufficient documentation

## 2017-04-21 DIAGNOSIS — Z79899 Other long term (current) drug therapy: Secondary | ICD-10-CM | POA: Insufficient documentation

## 2017-04-21 DIAGNOSIS — K219 Gastro-esophageal reflux disease without esophagitis: Secondary | ICD-10-CM | POA: Insufficient documentation

## 2017-04-21 DIAGNOSIS — Z6841 Body Mass Index (BMI) 40.0 and over, adult: Secondary | ICD-10-CM | POA: Insufficient documentation

## 2017-04-21 DIAGNOSIS — E7800 Pure hypercholesterolemia, unspecified: Secondary | ICD-10-CM | POA: Insufficient documentation

## 2017-04-21 DIAGNOSIS — Z95828 Presence of other vascular implants and grafts: Secondary | ICD-10-CM

## 2017-04-21 DIAGNOSIS — Z9989 Dependence on other enabling machines and devices: Secondary | ICD-10-CM | POA: Insufficient documentation

## 2017-04-21 DIAGNOSIS — Z7982 Long term (current) use of aspirin: Secondary | ICD-10-CM | POA: Insufficient documentation

## 2017-04-21 HISTORY — PX: PORTACATH PLACEMENT: SHX2246

## 2017-04-21 LAB — GLUCOSE, CAPILLARY
Glucose-Capillary: 222 mg/dL — ABNORMAL HIGH (ref 65–99)
Glucose-Capillary: 249 mg/dL — ABNORMAL HIGH (ref 65–99)

## 2017-04-21 SURGERY — INSERTION, TUNNELED CENTRAL VENOUS DEVICE, WITH PORT
Anesthesia: Monitor Anesthesia Care

## 2017-04-21 MED ORDER — MIDAZOLAM HCL 2 MG/2ML IJ SOLN
1.0000 mg | INTRAMUSCULAR | Status: AC
  Administered 2017-04-21: 2 mg via INTRAVENOUS

## 2017-04-21 MED ORDER — PROPOFOL 10 MG/ML IV BOLUS
INTRAVENOUS | Status: AC
  Filled 2017-04-21: qty 20

## 2017-04-21 MED ORDER — HEPARIN SOD (PORK) LOCK FLUSH 100 UNIT/ML IV SOLN
INTRAVENOUS | Status: DC | PRN
  Administered 2017-04-21: 500 [IU] via INTRAVENOUS

## 2017-04-21 MED ORDER — LIDOCAINE HCL (PF) 1 % IJ SOLN
INTRAMUSCULAR | Status: DC | PRN
  Administered 2017-04-21: 10 mL

## 2017-04-21 MED ORDER — VANCOMYCIN HCL IN DEXTROSE 1-5 GM/200ML-% IV SOLN
INTRAVENOUS | Status: AC
  Filled 2017-04-21: qty 200

## 2017-04-21 MED ORDER — HEPARIN SOD (PORK) LOCK FLUSH 100 UNIT/ML IV SOLN
INTRAVENOUS | Status: AC
  Filled 2017-04-21: qty 5

## 2017-04-21 MED ORDER — VANCOMYCIN HCL IN DEXTROSE 1-5 GM/200ML-% IV SOLN
1000.0000 mg | Freq: Once | INTRAVENOUS | Status: AC
  Administered 2017-04-21: 1000 mg via INTRAVENOUS

## 2017-04-21 MED ORDER — FENTANYL CITRATE (PF) 100 MCG/2ML IJ SOLN
25.0000 ug | Freq: Once | INTRAMUSCULAR | Status: AC
  Administered 2017-04-21: 25 ug via INTRAVENOUS

## 2017-04-21 MED ORDER — CHLORHEXIDINE GLUCONATE CLOTH 2 % EX PADS
6.0000 | MEDICATED_PAD | Freq: Once | CUTANEOUS | Status: DC
Start: 2017-04-21 — End: 2017-04-21

## 2017-04-21 MED ORDER — PROPOFOL 10 MG/ML IV BOLUS
INTRAVENOUS | Status: DC | PRN
  Administered 2017-04-21 (×3): 10 mg via INTRAVENOUS

## 2017-04-21 MED ORDER — SODIUM CHLORIDE 0.9 % IV SOLN
INTRAVENOUS | Status: AC | PRN
  Administered 2017-04-21: 100 mL via INTRAMUSCULAR

## 2017-04-21 MED ORDER — LACTATED RINGERS IV SOLN
INTRAVENOUS | Status: DC
  Administered 2017-04-21: 11:00:00 via INTRAVENOUS

## 2017-04-21 MED ORDER — FENTANYL CITRATE (PF) 100 MCG/2ML IJ SOLN
INTRAMUSCULAR | Status: AC
  Filled 2017-04-21: qty 2

## 2017-04-21 MED ORDER — PROPOFOL 500 MG/50ML IV EMUL
INTRAVENOUS | Status: DC | PRN
  Administered 2017-04-21: 120 ug/kg/min via INTRAVENOUS
  Administered 2017-04-21: 65 ug/kg/min via INTRAVENOUS

## 2017-04-21 MED ORDER — MIDAZOLAM HCL 2 MG/2ML IJ SOLN
INTRAMUSCULAR | Status: AC
  Filled 2017-04-21: qty 2

## 2017-04-21 MED ORDER — CHLORHEXIDINE GLUCONATE CLOTH 2 % EX PADS: 6.0000 | MEDICATED_PAD | Freq: Once | CUTANEOUS | Status: DC

## 2017-04-21 MED ORDER — LIDOCAINE HCL (PF) 1 % IJ SOLN
INTRAMUSCULAR | Status: AC
  Filled 2017-04-21: qty 30

## 2017-04-21 MED ORDER — KETOROLAC TROMETHAMINE 30 MG/ML IJ SOLN
30.0000 mg | Freq: Once | INTRAMUSCULAR | Status: AC
  Administered 2017-04-21: 30 mg via INTRAVENOUS
  Filled 2017-04-21: qty 1

## 2017-04-21 MED ORDER — LIDOCAINE HCL (CARDIAC) 10 MG/ML IV SOLN
INTRAVENOUS | Status: DC | PRN
  Administered 2017-04-21: 8 mg via INTRAVENOUS
  Administered 2017-04-21: 50 mg via INTRAVENOUS

## 2017-04-21 SURGICAL SUPPLY — 35 items
APPLIER CLIP 9.375 SM OPEN (CLIP)
BAG DECANTER FOR FLEXI CONT (MISCELLANEOUS) ×2 IMPLANT
BAG HAMPER (MISCELLANEOUS) ×2 IMPLANT
CATH HICKMAN DUAL 12.0 (CATHETERS) IMPLANT
CHLORAPREP W/TINT 10.5 ML (MISCELLANEOUS) ×2 IMPLANT
CLIP APPLIE 9.375 SM OPEN (CLIP) IMPLANT
CLOTH BEACON ORANGE TIMEOUT ST (SAFETY) ×2 IMPLANT
COVER LIGHT HANDLE STERIS (MISCELLANEOUS) ×4 IMPLANT
DECANTER SPIKE VIAL GLASS SM (MISCELLANEOUS) ×2 IMPLANT
DERMABOND ADVANCED (GAUZE/BANDAGES/DRESSINGS) ×1
DERMABOND ADVANCED .7 DNX12 (GAUZE/BANDAGES/DRESSINGS) ×1 IMPLANT
DRAPE C-ARM FOLDED MOBILE STRL (DRAPES) ×2 IMPLANT
ELECT REM PT RETURN 9FT ADLT (ELECTROSURGICAL) ×2
ELECTRODE REM PT RTRN 9FT ADLT (ELECTROSURGICAL) ×1 IMPLANT
GLOVE BIOGEL PI IND STRL 7.0 (GLOVE) ×1 IMPLANT
GLOVE BIOGEL PI INDICATOR 7.0 (GLOVE) ×1
GLOVE SURG SS PI 7.5 STRL IVOR (GLOVE) ×2 IMPLANT
GOWN STRL REUS W/TWL LRG LVL3 (GOWN DISPOSABLE) ×4 IMPLANT
IV NS 500ML (IV SOLUTION) ×1
IV NS 500ML BAXH (IV SOLUTION) ×1 IMPLANT
KIT PORT POWER 8FR ISP MRI (Port) ×2 IMPLANT
KIT ROOM TURNOVER APOR (KITS) ×2 IMPLANT
MANIFOLD NEPTUNE II (INSTRUMENTS) ×2 IMPLANT
NEEDLE HYPO 25X1 1.5 SAFETY (NEEDLE) ×2 IMPLANT
PACK MINOR (CUSTOM PROCEDURE TRAY) ×2 IMPLANT
PAD ARMBOARD 7.5X6 YLW CONV (MISCELLANEOUS) ×2 IMPLANT
SET BASIN LINEN APH (SET/KITS/TRAYS/PACK) ×2 IMPLANT
SET INTRODUCER 12FR PACEMAKER (SHEATH) IMPLANT
SHEATH COOK PEEL AWAY SET 8F (SHEATH) IMPLANT
SUT PROLENE 3 0 PS 2 (SUTURE) IMPLANT
SUT VIC AB 3-0 SH 27 (SUTURE) ×1
SUT VIC AB 3-0 SH 27X BRD (SUTURE) ×1 IMPLANT
SUT VIC AB 4-0 PS2 27 (SUTURE) ×2 IMPLANT
SYR 20CC LL (SYRINGE) ×2 IMPLANT
SYR CONTROL 10ML LL (SYRINGE) ×2 IMPLANT

## 2017-04-21 NOTE — Addendum Note (Signed)
Addendum  created 04/21/17 1251 by Vista Deck, CRNA   Sign clinical note

## 2017-04-21 NOTE — Op Note (Signed)
Patient:  Renee Jackson  DOB:  [birth year]  MRN:  [id number]   Preop Diagnosis:  Pancreatic carcinoma, need for central venous access  Postop Diagnosis:  Same  Procedure:  Port-A-Cath insertion  Surgeon:  Aviva Signs, M.D.  Anes:  Mac   Indications:  Patient is a 71 year old white female with pancreatic carcinoma who needs central venous access for chemotherapy. The risks and benefits of the procedure including bleeding, infection, and pneumothorax were fully explained to the patient, who gave informed consent.  Procedure note:  The patient was placed the supine position. After monitored anesthesia care was given, the left upper chest was prepped and draped using the usual sterile technique with DuraPrep. Surgical site confirmation was performed. One percent Xylocaine was used for local anesthesia.  An incision was made below left clavicle. A subcutaneous pocket was then formed. The needles advanced into the left subclavian vein using the Seldinger technique while the patient was in Trendelenburg position without difficulty. A guidewire was then advanced into the right atrium under fluoroscopic guidance. An introducer and peel-away sheath were placed over the guidewire. The guidewire was removed and the catheter was inserted through the peel-away sheath. The peel-away sheath was removed and the catheter was attached to the port and the port placed in subcutaneous pocket. Adequate positioning was confirmed by fluoroscopy. Good backflow blood was noted on aspiration of the port. The port was flushed with heparin flush. The subcutaneous layer was reapproximated using a 3-0 Vicryl interrupted suture. The skin was closed using a 4-0 Vicryl subcuticular suture. Dermabond was then applied.  All tape and needle counts were correct at the end of the procedure. The patient was transferred to PACU in stable condition. A chest x-ray will be performed at that time.    Complications:  None  EBL:   Minimal  Specimen:  None

## 2017-04-21 NOTE — Transfer of Care (Signed)
Immediate Anesthesia Transfer of Care Note  Patient: Renee Jackson  Procedure(s) Performed: Procedure(s): INSERTION PORT-A-CATH (N/A)  Patient Location: PACU  Anesthesia Type:MAC  Level of Consciousness: sedated and patient cooperative  Airway & Oxygen Therapy: Patient Spontanous Breathing and Patient connected to nasal cannula oxygen  Post-op Assessment: Report given to RN and Post -op Vital signs reviewed and stable  Post vital signs: Reviewed and stable  Last Vitals: There were no vitals filed for this visit.  Last Pain:  Vitals:   04/21/17 1044  PainSc: 0-No pain      Patients Stated Pain Goal: 8 (32/35/57 3220)  Complications: No apparent anesthesia complications

## 2017-04-21 NOTE — Anesthesia Procedure Notes (Signed)
Procedure Name: MAC Date/Time: 04/21/2017 11:08 AM Performed by: Vista Deck Pre-anesthesia Checklist: Patient identified, Emergency Drugs available, Suction available, Timeout performed and Patient being monitored Patient Re-evaluated:Patient Re-evaluated prior to inductionOxygen Delivery Method: Non-rebreather mask

## 2017-04-21 NOTE — Anesthesia Postprocedure Evaluation (Signed)
Anesthesia Post Note  Patient: Renee Jackson  Procedure(s) Performed: Procedure(s) (LRB): INSERTION PORT-A-CATH (N/A)  Patient location during evaluation: Short Stay Anesthesia Type: MAC Level of consciousness: awake and alert Pain management: satisfactory to patient Vital Signs Assessment: post-procedure vital signs reviewed and stable Respiratory status: spontaneous breathing Cardiovascular status: stable Anesthetic complications: no     Last Vitals:  Vitals:   04/21/17 1246 04/21/17 1248  BP:  (!) 185/60  Pulse: 73   Resp: 17   Temp: 36.4 C     Last Pain:  Vitals:   04/21/17 1246  TempSrc: Oral  PainSc:                  Drucie Opitz

## 2017-04-21 NOTE — Anesthesia Postprocedure Evaluation (Signed)
Anesthesia Post Note  Patient: Renee Jackson  Procedure(s) Performed: Procedure(s) (LRB): INSERTION PORT-A-CATH (N/A)  Patient location during evaluation: PACU Anesthesia Type: MAC Level of consciousness: awake and alert Pain management: satisfactory to patient Vital Signs Assessment: post-procedure vital signs reviewed and stable Respiratory status: spontaneous breathing Cardiovascular status: stable Anesthetic complications: no     Last Vitals:  Vitals:   04/21/17 1145 04/21/17 1200  BP:  (!) 141/68  Pulse: 76 65  Resp:  18  Temp:      Last Pain:  Vitals:   04/21/17 1044  PainSc: 0-No pain                 Red Mandt

## 2017-04-21 NOTE — Anesthesia Preprocedure Evaluation (Signed)
Anesthesia Evaluation  Patient identified by MRN, date of birth, ID band Patient awake    Reviewed: Allergy & Precautions, NPO status , Patient's Chart, lab work & pertinent test results  Airway Mallampati: II  TM Distance: >3 FB Neck ROM: Full    Dental no notable dental hx. (+) Dental Advisory Given   Pulmonary shortness of breath, asthma , sleep apnea and Continuous Positive Airway Pressure Ventilation , COPD, former smoker,    breath sounds clear to auscultation       Cardiovascular hypertension, Pt. on medications  Rhythm:Regular Rate:Normal     Neuro/Psych  Headaches, Seizures - (inactive),  PSYCHIATRIC DISORDERS Depression    GI/Hepatic GERD  ,  Endo/Other  diabetes, Type 2Morbid obesity  Renal/GU      Musculoskeletal   Abdominal (+) + obese,   Peds  Hematology   Anesthesia Other Findings   Reproductive/Obstetrics                             Anesthesia Physical Anesthesia Plan  ASA: III  Anesthesia Plan: MAC   Post-op Pain Management:    Induction: Intravenous  Airway Management Planned: Simple Face Mask  Additional Equipment:   Intra-op Plan:   Post-operative Plan:   Informed Consent: I have reviewed the patients History and Physical, chart, labs and discussed the procedure including the risks, benefits and alternatives for the proposed anesthesia with the patient or authorized representative who has indicated his/her understanding and acceptance.     Plan Discussed with: CRNA  Anesthesia Plan Comments:         Anesthesia Quick Evaluation

## 2017-04-21 NOTE — Interval H&P Note (Signed)
History and Physical Interval Note:  04/21/2017 10:48 AM  Renee Jackson  has presented today for surgery, with the diagnosis of pancreatic carcinoma  The various methods of treatment have been discussed with the patient and family. After consideration of risks, benefits and other options for treatment, the patient has consented to  Procedure(s): INSERTION PORT-A-CATH (N/A) as a surgical intervention .  The patient's history has been reviewed, patient examined, no change in status, stable for surgery.  I have reviewed the patient's chart and labs.  Questions were answered to the patient's satisfaction.     Aviva Signs

## 2017-04-21 NOTE — Discharge Instructions (Signed)
Implanted Port Home Guide °An implanted port is a type of central line that is placed under the skin. Central lines are used to provide IV access when treatment or nutrition needs to be given through a person’s veins. Implanted ports are used for long-term IV access. An implanted port may be placed because: °· You need IV medicine that would be irritating to the small veins in your hands or arms. °· You need long-term IV medicines, such as antibiotics. °· You need IV nutrition for a long period. °· You need frequent blood draws for lab tests. °· You need dialysis. ° °Implanted ports are usually placed in the chest area, but they can also be placed in the upper arm, the abdomen, or the leg. An implanted port has two main parts: °· Reservoir. The reservoir is round and will appear as a small, raised area under your skin. The reservoir is the part where a needle is inserted to give medicines or draw blood. °· Catheter. The catheter is a thin, flexible tube that extends from the reservoir. The catheter is placed into a large vein. Medicine that is inserted into the reservoir goes into the catheter and then into the vein. ° °How will I care for my incision site? °Do not get the incision site wet. Bathe or shower as directed by your health care provider. °How is my port accessed? °Special steps must be taken to access the port: °· Before the port is accessed, a numbing cream can be placed on the skin. This helps numb the skin over the port site. °· Your health care provider uses a sterile technique to access the port. °? Your health care provider must put on a mask and sterile gloves. °? The skin over your port is cleaned carefully with an antiseptic and allowed to dry. °? The port is gently pinched between sterile gloves, and a needle is inserted into the port. °· Only "non-coring" port needles should be used to access the port. Once the port is accessed, a blood return should be checked. This helps ensure that the port  is in the vein and is not clogged. °· If your port needs to remain accessed for a constant infusion, a clear (transparent) bandage will be placed over the needle site. The bandage and needle will need to be changed every week, or as directed by your health care provider. °· Keep the bandage covering the needle clean and dry. Do not get it wet. Follow your health care provider’s instructions on how to take a shower or bath while the port is accessed. °· If your port does not need to stay accessed, no bandage is needed over the port. ° °What is flushing? °Flushing helps keep the port from getting clogged. Follow your health care provider’s instructions on how and when to flush the port. Ports are usually flushed with saline solution or a medicine called heparin. The need for flushing will depend on how the port is used. °· If the port is used for intermittent medicines or blood draws, the port will need to be flushed: °? After medicines have been given. °? After blood has been drawn. °? As part of routine maintenance. °· If a constant infusion is running, the port may not need to be flushed. ° °How long will my port stay implanted? °The port can stay in for as long as your health care provider thinks it is needed. When it is time for the port to come out, surgery will be   done to remove it. The procedure is similar to the one performed when the port was put in. °When should I seek immediate medical care? °When you have an implanted port, you should seek immediate medical care if: °· You notice a bad smell coming from the incision site. °· You have swelling, redness, or drainage at the incision site. °· You have more swelling or pain at the port site or the surrounding area. °· You have a fever that is not controlled with medicine. ° °This information is not intended to replace advice given to you by your health care provider. Make sure you discuss any questions you have with your health care provider. °Document  Released: 11/28/2005 Document Revised: 05/05/2016 Document Reviewed: 08/05/2013 °Elsevier Interactive Patient Education © 2017 [employer] °Implanted Port Insertion, Care After °This sheet gives you information about how to care for yourself after your procedure. Your health care provider may also give you more specific instructions. If you have problems or questions, contact your health care provider. °What can I expect after the procedure? °After your procedure, it is common to have: °· Discomfort at the port insertion site. °· Bruising on the skin over the port. This should improve over 3-4 days. ° °Follow these instructions at home: °Port care °· After your port is placed, you will get a manufacturer's information card. The card has information about your port. Keep this card with you at all times. °· Take care of the port as told by your health care provider. Ask your health care provider if you or a family member can get training for taking care of the port at home. A home health care nurse may also take care of the port. °· Make sure to remember what type of port you have. °Incision care °· Follow instructions from your health care provider about how to take care of your port insertion site. Make sure you: °? Wash your hands with soap and water before you change your bandage (dressing). If soap and water are not available, use hand sanitizer. °? Change your dressing as told by your health care provider. °? Leave stitches (sutures), skin glue, or adhesive strips in place. These skin closures may need to stay in place for 2 weeks or longer. If adhesive strip edges start to loosen and curl up, you may trim the loose edges. Do not remove adhesive strips completely unless your health care provider tells you to do that. °· Check your port insertion site every day for signs of infection. Check for: °? More redness, swelling, or pain. °? More fluid or blood. °? Warmth. °? Pus or a bad smell. °General  instructions °· Do not take baths, swim, or use a hot tub until your health care provider approves. °· Do not lift anything that is heavier than 10 lb (4.5 kg) for a week, or as told by your health care provider. °· Ask your health care provider when it is okay to: °? Return to work or school. °? Resume usual physical activities or sports. °· Do not drive for 24 hours if you were given a medicine to help you relax (sedative). °· Take over-the-counter and prescription medicines only as told by your health care provider. °· Wear a medical alert bracelet in case of an emergency. This will tell any health care providers that you have a port. °· Keep all follow-up visits as told by your health care provider. This is important. °Contact a health care provider if: °· You cannot   flush your port with saline as directed, or you cannot draw blood from the port. °· You have a fever or chills. °· You have more redness, swelling, or pain around your port insertion site. °· You have more fluid or blood coming from your port insertion site. °· Your port insertion site feels warm to the touch. °· You have pus or a bad smell coming from the port insertion site. °Get help right away if: °· You have chest pain or shortness of breath. °· You have bleeding from your port that you cannot control. °Summary °· Take care of the port as told by your health care provider. °· Change your dressing as told by your health care provider. °· Keep all follow-up visits as told by your health care provider. °This information is not intended to replace advice given to you by your health care provider. Make sure you discuss any questions you have with your health care provider. °Document Released: 09/18/2013 Document Revised: 10/19/2016 Document Reviewed: 10/19/2016 °Elsevier Interactive Patient Education © 2017 Elsevier Inc. ° °

## 2017-04-25 ENCOUNTER — Encounter (HOSPITAL_COMMUNITY): Payer: Self-pay | Admitting: General Surgery

## 2017-04-26 ENCOUNTER — Encounter (HOSPITAL_COMMUNITY): Payer: Medicare Other

## 2017-04-26 ENCOUNTER — Encounter (HOSPITAL_BASED_OUTPATIENT_CLINIC_OR_DEPARTMENT_OTHER): Payer: Medicare Other

## 2017-04-26 VITALS — BP 183/51 | HR 64 | Temp 98.1°F | Resp 18 | Wt 216.6 lb

## 2017-04-26 DIAGNOSIS — Z5111 Encounter for antineoplastic chemotherapy: Secondary | ICD-10-CM

## 2017-04-26 DIAGNOSIS — C259 Malignant neoplasm of pancreas, unspecified: Secondary | ICD-10-CM

## 2017-04-26 DIAGNOSIS — K8689 Other specified diseases of pancreas: Secondary | ICD-10-CM

## 2017-04-26 DIAGNOSIS — K869 Disease of pancreas, unspecified: Secondary | ICD-10-CM | POA: Diagnosis present

## 2017-04-26 LAB — CBC WITH DIFFERENTIAL/PLATELET
BASOS ABS: 0.1 10*3/uL (ref 0.0–0.1)
BASOS PCT: 1 %
Eosinophils Absolute: 0.4 10*3/uL (ref 0.0–0.7)
Eosinophils Relative: 5 %
HEMATOCRIT: 34.6 % — AB (ref 36.0–46.0)
Hemoglobin: 11.4 g/dL — ABNORMAL LOW (ref 12.0–15.0)
LYMPHS ABS: 2.3 10*3/uL (ref 0.7–4.0)
Lymphocytes Relative: 27 %
MCH: 28.1 pg (ref 26.0–34.0)
MCHC: 32.9 g/dL (ref 30.0–36.0)
MCV: 85.2 fL (ref 78.0–100.0)
MONO ABS: 0.6 10*3/uL (ref 0.1–1.0)
Monocytes Relative: 7 %
NEUTROS ABS: 5.2 10*3/uL (ref 1.7–7.7)
Neutrophils Relative %: 60 %
PLATELETS: 210 10*3/uL (ref 150–400)
RBC: 4.06 MIL/uL (ref 3.87–5.11)
RDW: 12.9 % (ref 11.5–15.5)
WBC: 8.6 10*3/uL (ref 4.0–10.5)

## 2017-04-26 LAB — COMPREHENSIVE METABOLIC PANEL
ALT: 11 U/L — ABNORMAL LOW (ref 14–54)
ANION GAP: 13 (ref 5–15)
AST: 21 U/L (ref 15–41)
Albumin: 3.7 g/dL (ref 3.5–5.0)
Alkaline Phosphatase: 60 U/L (ref 38–126)
BILIRUBIN TOTAL: 0.9 mg/dL (ref 0.3–1.2)
BUN: 13 mg/dL (ref 6–20)
CHLORIDE: 92 mmol/L — AB (ref 101–111)
CO2: 26 mmol/L (ref 22–32)
Calcium: 8.9 mg/dL (ref 8.9–10.3)
Creatinine, Ser: 0.84 mg/dL (ref 0.44–1.00)
Glucose, Bld: 251 mg/dL — ABNORMAL HIGH (ref 65–99)
POTASSIUM: 3.5 mmol/L (ref 3.5–5.1)
Sodium: 131 mmol/L — ABNORMAL LOW (ref 135–145)
TOTAL PROTEIN: 6 g/dL — AB (ref 6.5–8.1)

## 2017-04-26 MED ORDER — SODIUM CHLORIDE 0.9 % IV SOLN
1000.0000 mg/m2 | Freq: Once | INTRAVENOUS | Status: AC
  Administered 2017-04-26: 2090 mg via INTRAVENOUS
  Filled 2017-04-26: qty 5.26

## 2017-04-26 MED ORDER — PROCHLORPERAZINE MALEATE 10 MG PO TABS
10.0000 mg | ORAL_TABLET | Freq: Once | ORAL | Status: AC
  Administered 2017-04-26: 10 mg via ORAL

## 2017-04-26 MED ORDER — HEPARIN SOD (PORK) LOCK FLUSH 100 UNIT/ML IV SOLN
500.0000 [IU] | Freq: Once | INTRAVENOUS | Status: AC | PRN
  Administered 2017-04-26: 500 [IU]
  Filled 2017-04-26: qty 5

## 2017-04-26 MED ORDER — PACLITAXEL PROTEIN-BOUND CHEMO INJECTION 100 MG
125.0000 mg/m2 | Freq: Once | INTRAVENOUS | Status: AC
  Administered 2017-04-26: 250 mg via INTRAVENOUS
  Filled 2017-04-26: qty 50

## 2017-04-26 MED ORDER — SODIUM CHLORIDE 0.9 % IV SOLN
Freq: Once | INTRAVENOUS | Status: AC
  Administered 2017-04-26: 12:00:00 via INTRAVENOUS

## 2017-04-26 MED ORDER — PROCHLORPERAZINE MALEATE 10 MG PO TABS
ORAL_TABLET | ORAL | Status: AC
  Filled 2017-04-26: qty 1

## 2017-04-26 NOTE — Progress Notes (Signed)
Consent signed.  Chemotherapy education completed.  Extensive teaching packet given.

## 2017-04-26 NOTE — Patient Instructions (Signed)
[facility] Discharge Instructions for Patients Receiving Chemotherapy   Beginning January 23rd 2017 lab work for the [facility] will be done in the  Main lab at [facility] on 1st floor. If you have a lab appointment with the [facility] please come in thru the  Main Entrance and check in at the main information desk   Today you received the following chemotherapy agents gemzar and abraxane.  To help prevent nausea and vomiting after your treatment, we encourage you to take your nausea medication as instructed.  If you develop nausea and vomiting, or diarrhea that is not controlled by your medication, call the clinic.  The clinic phone number is [phone]. Office hours are Monday-Friday 8:30am-5:00pm.  BELOW ARE SYMPTOMS THAT SHOULD BE REPORTED IMMEDIATELY:  *FEVER GREATER THAN 101.0 F  *CHILLS WITH OR WITHOUT FEVER  NAUSEA AND VOMITING THAT IS NOT CONTROLLED WITH YOUR NAUSEA MEDICATION  *UNUSUAL SHORTNESS OF BREATH  *UNUSUAL BRUISING OR BLEEDING  TENDERNESS IN MOUTH AND THROAT WITH OR WITHOUT PRESENCE OF ULCERS  *URINARY PROBLEMS  *BOWEL PROBLEMS  UNUSUAL RASH Items with * indicate a potential emergency and should be followed up as soon as possible. If you have an emergency after office hours please contact your primary care physician or go to the nearest emergency department.  Please call the clinic during office hours if you have any questions or concerns.   You may also contact the Patient Navigator at [phone] should you have any questions or need assistance in obtaining follow up care.      Resources For Cancer Patients and their Caregivers ? American Cancer Society: Can assist with transportation, wigs, general needs, runs Look Good Feel Better.        3076403986 ? Cancer Care: Provides financial assistance, online support groups, medication/co-pay assistance.  1-800-813-HOPE (769)144-9416) ? [place] Assists [place] cancer patients and their families through emotional , educational and financial support.  332 563 2690 ? [place] DSS Where to apply for food stamps, Medicaid and utility assistance. 573-403-1711 ? [employer]: Transportation to medical appointments. 548-688-4569 ? Social Security Administration: May apply for disability if have a Stage IV cancer. ([phone] 820 261 8329 ? [employer], Disability and Transit Services: Assists with nutrition, care and transit needs. (339)410-0412

## 2017-04-26 NOTE — Progress Notes (Signed)
Tolerated chemo well. Stable and ambulatory on discharge home with daughter. 

## 2017-04-27 ENCOUNTER — Telehealth (HOSPITAL_COMMUNITY): Payer: Self-pay | Admitting: *Deleted

## 2017-04-27 LAB — CANCER ANTIGEN 19-9: CA 19-9: 242 U/mL — ABNORMAL HIGH (ref 0–35)

## 2017-04-27 NOTE — Telephone Encounter (Signed)
Call to patient's home, left message on answering machine. Called patient's daughter Vita Barley. She reports patient stayed with her last night but went home today. Reports patient complained of mild nausea last pm and diarrhea. Reports patient had diarrhea for a few days prior to chemo treatment but felt like it increased after chemo yesterday. Patient reports feeling tired. Reports patient complained of feeling sore in her neck,chest and shoulder from port placement last Friday. Message instructed patient to call clinic with any worsening symptoms or unrelieved symptoms.

## 2017-05-01 ENCOUNTER — Telehealth (HOSPITAL_COMMUNITY): Payer: Self-pay | Admitting: Emergency Medicine

## 2017-05-01 NOTE — Telephone Encounter (Signed)
Left My a message to call me back about the symptoms she was having.

## 2017-05-01 NOTE — Telephone Encounter (Signed)
Pt called and I asked her how she had been feeling.  She said that she slept better last night and that had helped some.  She did not sleep good the night before and her chest and stomach was itching.  She thinks she could have a rash but did not see one today.  I told her to keep the area clean and dry and to try benadryl 1-2 tablets before she went to bed to see if that would help the itching, also this would help her sleep better.  She can take that at night for sleeping.  I told her to call me tomorrow if this was not any better.  She verbalized understanding.

## 2017-05-02 ENCOUNTER — Encounter: Payer: Self-pay | Admitting: *Deleted

## 2017-05-02 ENCOUNTER — Other Ambulatory Visit (HOSPITAL_COMMUNITY): Payer: Self-pay | Admitting: Emergency Medicine

## 2017-05-02 MED ORDER — FIRST-DUKES MOUTHWASH MT SUSP: 5.0000 mL | OROMUCOSAL | 3 refills | Status: AC | PRN

## 2017-05-02 MED ORDER — FIRST-DUKES MOUTHWASH MT SUSP: 5.0000 mL | OROMUCOSAL | 3 refills | Status: DC | PRN

## 2017-05-02 NOTE — Progress Notes (Signed)
Pt called and stated that she has blisters in her mouth this morning.  She said that she had swished with salt water.  I told her she could still keep doing that and I had called her in some dukes mouth wash that she could swish with every 4 hours.  She verbalized understanding.

## 2017-05-02 NOTE — Progress Notes (Signed)
[facility] Psychosocial Distress Screening Clinical Social Work  Clinical Social Work was referred by distress screening protocol.  The patient scored a 8 on the Psychosocial Distress Thermometer which indicates severe distress. Clinical Social Worker reviewed chart and phoned pt to assess for distress and other psychosocial needs. At time of distress screen, treatment planning was delayed. CSW contacted pt to check in and CSW left brief message introduced self, explained role of CSW/Pt and Family Support Team, support groups and other resources to assist. CSW encouraged return call.    ONCBCN DISTRESS SCREENING 03/21/2017  Screening Type Initial Screening  Distress experienced in past week (1-10) 8  Emotional problem type Depression;Nervousness/Anxiety;Adjusting to illness  Spiritual/Religous concerns type Relating to God  Information Concerns Type Lack of info about diagnosis;Lack of info about maintaining fitness  Physical Problem type Pain;Nausea/vomiting;Breathing;Constipation/diarrhea;Tingling hands/feet;Skin dry/itchy  Physician notified of physical symptoms Yes  Referral to clinical psychology No  Referral to clinical social work Yes  Referral to dietition No  Referral to financial advocate No  Referral to support programs No  Referral to palliative care No    Clinical Social Worker follow up needed: Yes.    If yes, follow up plan: See above Loren Racer, LCSW, OSW-C Clinical Social Worker [place]  [facility] Phone: [phone] Fax: [phone]

## 2017-05-03 ENCOUNTER — Encounter (HOSPITAL_COMMUNITY): Payer: Self-pay

## 2017-05-03 ENCOUNTER — Encounter (HOSPITAL_BASED_OUTPATIENT_CLINIC_OR_DEPARTMENT_OTHER): Payer: Medicare Other

## 2017-05-03 ENCOUNTER — Encounter (HOSPITAL_BASED_OUTPATIENT_CLINIC_OR_DEPARTMENT_OTHER): Payer: Medicare Other | Admitting: Oncology

## 2017-05-03 VITALS — BP 116/36 | HR 75 | Temp 98.0°F | Resp 18

## 2017-05-03 VITALS — BP 131/70 | HR 85 | Temp 97.9°F | Resp 18 | Wt 218.0 lb

## 2017-05-03 DIAGNOSIS — Z5111 Encounter for antineoplastic chemotherapy: Secondary | ICD-10-CM | POA: Diagnosis present

## 2017-05-03 DIAGNOSIS — C259 Malignant neoplasm of pancreas, unspecified: Secondary | ICD-10-CM

## 2017-05-03 DIAGNOSIS — K869 Disease of pancreas, unspecified: Secondary | ICD-10-CM | POA: Diagnosis not present

## 2017-05-03 DIAGNOSIS — E119 Type 2 diabetes mellitus without complications: Secondary | ICD-10-CM

## 2017-05-03 DIAGNOSIS — K8689 Other specified diseases of pancreas: Secondary | ICD-10-CM

## 2017-05-03 DIAGNOSIS — R739 Hyperglycemia, unspecified: Secondary | ICD-10-CM

## 2017-05-03 LAB — COMPREHENSIVE METABOLIC PANEL
ALK PHOS: 66 U/L (ref 38–126)
ALT: 15 U/L (ref 14–54)
AST: 24 U/L (ref 15–41)
Albumin: 3.5 g/dL (ref 3.5–5.0)
Anion gap: 12 (ref 5–15)
BILIRUBIN TOTAL: 0.5 mg/dL (ref 0.3–1.2)
BUN: 21 mg/dL — AB (ref 6–20)
CALCIUM: 8.6 mg/dL — AB (ref 8.9–10.3)
CO2: 26 mmol/L (ref 22–32)
CREATININE: 0.95 mg/dL (ref 0.44–1.00)
Chloride: 91 mmol/L — ABNORMAL LOW (ref 101–111)
GFR, EST NON AFRICAN AMERICAN: 60 mL/min — AB (ref >60)
Glucose, Bld: 406 mg/dL — ABNORMAL HIGH (ref 65–99)
Potassium: 5.2 mmol/L — ABNORMAL HIGH (ref 3.5–5.1)
Sodium: 129 mmol/L — ABNORMAL LOW (ref 135–145)
Total Protein: 5.9 g/dL — ABNORMAL LOW (ref 6.5–8.1)

## 2017-05-03 LAB — CBC WITH DIFFERENTIAL/PLATELET
BASOS ABS: 0 10*3/uL (ref 0.0–0.1)
Basophils Relative: 1 %
Eosinophils Absolute: 0.2 10*3/uL (ref 0.0–0.7)
Eosinophils Relative: 5 %
HEMATOCRIT: 30.8 % — AB (ref 36.0–46.0)
HEMOGLOBIN: 10.1 g/dL — AB (ref 12.0–15.0)
LYMPHS PCT: 51 %
Lymphs Abs: 1.7 10*3/uL (ref 0.7–4.0)
MCH: 28.1 pg (ref 26.0–34.0)
MCHC: 32.8 g/dL (ref 30.0–36.0)
MCV: 85.8 fL (ref 78.0–100.0)
MONO ABS: 0.2 10*3/uL (ref 0.1–1.0)
Monocytes Relative: 5 %
NEUTROS PCT: 38 %
Neutro Abs: 1.3 10*3/uL — ABNORMAL LOW (ref 1.7–7.7)
Platelets: 151 10*3/uL (ref 150–400)
RBC: 3.59 MIL/uL — ABNORMAL LOW (ref 3.87–5.11)
RDW: 12.8 % (ref 11.5–15.5)
WBC: 3.4 10*3/uL — ABNORMAL LOW (ref 4.0–10.5)

## 2017-05-03 MED ORDER — PACLITAXEL PROTEIN-BOUND CHEMO INJECTION 100 MG
125.0000 mg/m2 | Freq: Once | INTRAVENOUS | Status: AC
  Administered 2017-05-03: 250 mg via INTRAVENOUS
  Filled 2017-05-03: qty 50

## 2017-05-03 MED ORDER — INSULIN ASPART 100 UNIT/ML ~~LOC~~ SOLN
8.0000 [IU] | Freq: Once | SUBCUTANEOUS | Status: AC
  Administered 2017-05-03: 8 [IU] via SUBCUTANEOUS
  Filled 2017-05-03: qty 0.08

## 2017-05-03 MED ORDER — PROCHLORPERAZINE MALEATE 10 MG PO TABS
10.0000 mg | ORAL_TABLET | Freq: Once | ORAL | Status: AC
  Administered 2017-05-03: 10 mg via ORAL
  Filled 2017-05-03: qty 1

## 2017-05-03 MED ORDER — SODIUM CHLORIDE 0.9 % IV SOLN
Freq: Once | INTRAVENOUS | Status: AC
  Administered 2017-05-03: 13:00:00 via INTRAVENOUS

## 2017-05-03 MED ORDER — HEPARIN SOD (PORK) LOCK FLUSH 100 UNIT/ML IV SOLN
500.0000 [IU] | Freq: Once | INTRAVENOUS | Status: AC | PRN
  Administered 2017-05-03: 500 [IU]
  Filled 2017-05-03: qty 5

## 2017-05-03 MED ORDER — SODIUM CHLORIDE 0.9 % IV SOLN
1000.0000 mg/m2 | Freq: Once | INTRAVENOUS | Status: AC
  Administered 2017-05-03: 2090 mg via INTRAVENOUS
  Filled 2017-05-03: qty 54.97

## 2017-05-03 NOTE — Patient Instructions (Signed)
[facility] Discharge Instructions  RECOMMENDATIONS MADE BY THE CONSULTANT AND ANY TEST RESULTS WILL BE SENT TO YOUR REFERRING PHYSICIAN.  You were seen today by Dr. Louise Zhou    Thank you for choosing [facility] at [facility] to provide your oncology and hematology care.  To afford each patient quality time with our provider, please arrive at least 15 minutes before your scheduled appointment time.    If you have a lab appointment with the Cancer Center please come in thru the  Main Entrance and check in at the main information desk  You need to re-schedule your appointment should you arrive 10 or more minutes late.  We strive to give you quality time with our providers, and arriving late affects you and other patients whose appointments are after yours.  Also, if you no show three or more times for appointments you may be dismissed from the clinic at the providers discretion.     Again, thank you for choosing [facility].  Our hope is that these requests will decrease the amount of time that you wait before being seen by our physicians.       _____________________________________________________________  Should you have questions after your visit to [facility], please contact our office at [phone] between the hours of 8:30 a.m. and 4:30 p.m.  Voicemails left after 4:30 p.m. will not be returned until the following business day.  For prescription refill requests, have your pharmacy contact our office.       Resources For Cancer Patients and their Caregivers ? American Cancer Society: Can assist with transportation, wigs, general needs, runs Look Good Feel Better.        1-888-227-6333 ? Cancer Care: Provides financial assistance, online support groups, medication/co-pay assistance.  1-800-813-HOPE (4673) ? [facility] Assists [place] cancer patients and their  families through emotional , educational and financial support.  336-427-4357 ? [place] DSS Where to apply for food stamps, Medicaid and utility assistance. 336-342-1394 ? RCATS: Transportation to medical appointments. 336-347-2287 ? Social Security Administration: May apply for disability if have a Stage IV cancer. 336-342-7796 1-800-772-1213 ? [place] [employer], Disability and Transit Services: Assists with nutrition, care and transit needs. 336-349-2343  Cancer Center Support Programs: [email or web address]@ > Cancer Support Group  2nd Tuesday of the month 1pm-2pm, Journey Room  > Creative Journey  3rd Tuesday of the month 1130am-1pm, Journey Room  > Look Good Feel Better  1st Wednesday of the month 10am-12 noon, Journey Room (Call American Cancer Society to register 1-800-395-5775)    

## 2017-05-03 NOTE — Progress Notes (Signed)
[facility] Hematology/Oncology Progress Note   Name: Renee Jackson      MRN: [id number]    Location: Room/bed info not found  Date: 05/03/2017 Time:11:55 AM   REFERRING PHYSICIAN:  Gar Ponto, MD (Primary Care Provider)  REASON FOR CONSULT:  Pancreatic Cancer   DIAGNOSIS: Pancreatic Adenocarcinoma    Malignant neoplasm of pancreas ([place])   03/21/2017 Tumor Marker    Patient's tumor was tested for the following markers: CA 19-9 Results of the tumor marker test revealed 301.      03/27/2017 PET scan    Relatively low-level hypermetabolism corresponding to the pancreatic head mass. This measures on the order of 3.8 cm and a S.U.V. max of 4.4 on image 107/series 4. Re- demonstration of pancreatic body and tail atrophy with duct dilatation.  No other hypermetabolism within abdominopelvic nodes or parenchyma. Abdominopelvic findings deferred to prior diagnostic CT. Cholelithiasis. Abdominal aortic atherosclerosis. Fat containing ventral abdominal wall laxity, status post hernia repair.      03/30/2017 Initial Diagnosis    Pancreatic adenocarcinoma ([place])      03/30/2017 Procedure    EUS with FNA of pancreatic mass performed, path positive for adenocarcinoma.      HISTORY OF PRESENT ILLNESS:   Renee Jackson is a 71 y.o. female returns for follow up of a pancreatic mass suspicious for cancer.  Patient reports that she presented to her primary care physician with epigastric discomfort.  It was presumed that she had issues with her gallbladder.  She was treated symptomatically with dietary restrictions.  Her epigastric pain continued and symptoms progressed to include nausea and vomiting with all foods.  This led her to the emergency department in April 2018.  In the emergency department, ultrasound was performed which led to further imaging including MR abdomen.  MRI of the abdomen demonstrated a hypoenhancing mass in the pancreatic body and head measuring  3.9 x 2.1 x 3.5 cm extending to the posterior medial pancreatic margin and abutting the edge of the portal vein and SMV without obvious invasion or wrapping around of the structures.  The common bile duct skirts the right lateral margin of the mass is not definitely encased by the mass.  There is no common bile duct dilatation.  No definite venous invasion of the mass nor wrapping of the venous structures by the mass identified. 03/30/17: EUS with FNA of pancreatic mass performed, path positive for adenocarcinoma.' She reports saw Dr. Crisoforo Oxford for surgical evaluation on 5/1 and he recommended neoadjuvant chemotherapy prior to surgery.  Renee Jackson presents to the clinic for nadir check following chemo. Patient started cycle 1 of neoadjuvant chemotherapy with Gemzar and Abraxane on 04/26/17. She tolerated treatment well. She has some mild nausea and fatigue 3 days after she received treatment. She also developed some mouth sores on her inner lip for which she called [place] about, and we prescribed her Dukes mouthwash. He also had mild rash on her chest and abdomen, which went away after she took Benadryl. Otherwise her appetite is good. She denies any abdominal pain. She denies any chest pain, shortness breath, focal weakness.    Review of Systems  Constitutional: Negative for chills, fever and weight loss.  HENT: Negative.  Negative for hearing loss, sore throat and tinnitus.   Eyes: Negative.  Negative for blurred vision, photophobia and discharge.  Respiratory: Negative.  Negative for cough, hemoptysis, shortness of breath and wheezing.   Cardiovascular: Negative.  Negative for chest  pain, palpitations, orthopnea, claudication and leg swelling.  Gastrointestinal: Negative for abdominal pain, constipation, diarrhea, melena, nausea and vomiting.  Genitourinary: Negative.  Negative for dysuria and hematuria.  Musculoskeletal: Negative.  Negative for back pain, joint pain and myalgias.  Skin: Negative.   Negative for itching and rash.  Neurological: Negative.  Negative for dizziness, weakness and headaches.  Endo/Heme/Allergies: Negative.  Negative for environmental allergies and polydipsia. Does not bruise/bleed easily.  Psychiatric/Behavioral: Negative.  Negative for depression. The patient is not nervous/anxious and does not have insomnia.   All other systems reviewed and are negative. 14 point review of systems was performed and is negative except as detailed under history of present illness and above  PAST MEDICAL HISTORY:   Past Medical History:  Diagnosis Date  . Arthritis    hands  . Asthma   . Atrial fibrillation ([place])   . Cancer [facility])    pancreatic  . Chronic lower back pain   . COPD (chronic obstructive pulmonary disease) ([place])   . Depression   . Diabetes ([place])    type 2  . Diabetic neuropathy ([facility])    feet and legs  . Dyspnea   . GERD (gastroesophageal reflux disease)   . Headache    migraines  . Hypercholesteremia   . Hypertension   . Neuropathy    both feet  . Pancreatic mass 03/21/2017  . Pancreatic mass   . Pneumonia 1995 severe case   2 times before that  . Seizures ([place]) as child  to age 25   with high fevers  . Sleep apnea    cpap uses sometimes  . Umbilical hernia     ALLERGIES: Allergies  Allergen Reactions  . Sulfa Antibiotics     itching      MEDICATIONS: I have reviewed the patient's current medications.    Current Outpatient Prescriptions on File Prior to Visit  Medication Sig Dispense Refill  . aspirin EC 81 MG tablet Take 81 mg by mouth daily.    Marland Kitchen atorvastatin (LIPITOR) 10 MG tablet Take 10 mg by mouth every morning.     . cetirizine (ZYRTEC) 10 MG tablet Take 10 mg by mouth at bedtime.    . citalopram (CELEXA) 20 MG tablet Take 20 mg by mouth every morning.     . Diphenhyd-Hydrocort-Nystatin (FIRST-DUKES MOUTHWASH) SUSP Use as directed 5 mLs in the mouth or throat every 4 (four) hours as needed. 240 mL 3  . Gemcitabine HCl (GEMZAR  IV) Inject into the vein. Day 1, 8, 15 every 28 days    . glipiZIDE (GLUCOTROL XL) 10 MG 24 hr tablet Take 10 mg by mouth daily with breakfast.     . hydrochlorothiazide (HYDRODIURIL) 25 MG tablet Take 25 mg by mouth at bedtime.     Marland Kitchen HYDROcodone-acetaminophen (NORCO/VICODIN) 5-325 MG tablet Take 0.5 tablets by mouth every 6 (six) hours as needed for moderate pain.     Marland Kitchen lidocaine-prilocaine (EMLA) cream Apply to affected area once 30 g 3  . LORazepam (ATIVAN) 0.5 MG tablet Take 1 tablet (0.5 mg total) by mouth every 6 (six) hours as needed (Nausea or vomiting). 30 tablet 0  . losartan (COZAAR) 100 MG tablet Take 100 mg by mouth every morning.     . metFORMIN (GLUCOPHAGE) 1000 MG tablet Take 1,000 mg by mouth 2 (two) times daily with a meal.     . metoprolol succinate (TOPROL-XL) 50 MG 24 hr tablet Take 50 mg by mouth every morning.     Marland Kitchen  montelukast (SINGULAIR) 10 MG tablet Take 10 mg by mouth every morning.     . Multiple Vitamins-Minerals (MULTIVITAMIN GUMMIES ADULT) CHEW Chew 2 tablets by mouth daily.    Marland Kitchen omeprazole (PRILOSEC) 20 MG capsule Take 40 mg by mouth every morning.     . ondansetron (ZOFRAN) 8 MG tablet Take 1 tablet (8 mg total) by mouth 2 (two) times daily as needed (Nausea or vomiting). 30 tablet 1  . PACLitaxel Protein-Bound Part (ABRAXANE IV) Inject into the vein. Day 1, 8, 15 every 28 days    . promethazine (PHENERGAN) 25 MG tablet Take 25 mg by mouth every 6 (six) hours as needed for nausea or vomiting.     . Simethicone (ALKA-SELTZER ANTI-GAS PO) Take 1 tablet by mouth daily as needed (gas).     No current facility-administered medications on file prior to visit.      PAST SURGICAL HISTORY Past Surgical History:  Procedure Laterality Date  . CESAREAN SECTION     x 1  . EUS N/A 03/30/2017   Procedure: UPPER ENDOSCOPIC ULTRASOUND (EUS) LINEAR;  Surgeon: Milus Banister, MD;  Location: [facility] END[facility]COPY;  Service: Endoscopy;  Laterality: N/A;  . HERNIA REPAIR     x 2  umbilical   . PORTACATH PLACEMENT N/A 04/21/2017   Procedure: INSERTION PORT-A-CATH;  Surgeon: Aviva Signs, MD;  Location: [facility] ORS;  Service: General;  Laterality: N/A;  . UMBILICAL HERNIA REPAIR     multiple times    FAMILY HISTORY: Family History  Problem Relation Age of Onset  . Heart disease Mother   . Dementia Father    Mother deceased at the age of 67 secondary to complications of heart disease and diabetes. Father deceased at the age of 58 secondary to complications of dementia. She has 5 brothers, 4 of which are deceased. She has 3 sisters. She has a daughter 51 years old with multiple medical issues including intracranial hypertension, neuropathy, back issues, and morbid obesity. She has 43 son is 73 years old in good health.  SOCIAL HISTORY:  reports that she quit smoking about 23 years ago. Her smoking use included Cigarettes. She has a 20.00 pack-year smoking history. She has never used smokeless tobacco. She reports that she does not drink alcohol or use drugs.  She denies any illicit drug abuse.  She rarely drinks alcohol.  She does have a 1 pack per day 20 year smoking history quitting 22 years ago.  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  She is Psychologist, forensic and religion.  She is widowed.  Her husband passed away in 11-22-2016 secondary to stage IV pancreatic cancer.  He was not offered chemotherapy given his extent of disease.  Social History   Social History  . Marital status: Married    Spouse name: N/A  . Number of children: N/A  . Years of education: N/A   Social History Main Topics  . Smoking status: Former Smoker    Packs/day: 1.00    Years: 20.00    Types: Cigarettes    Quit date: 03/21/1994  . Smokeless tobacco: Never Used  . Alcohol use No  . Drug use: No  . Sexual activity: No   Other Topics Concern  . None   Social History Narrative  . None    PERFORMANCE STATUS: The patient's  performance status is 1 - Symptomatic but completely ambulatory  PHYSICAL EXAM:  Vitals:  05/03/17 1140  BP: 131/70  Pulse: 85  Resp: 18  Temp: 97.9 F (36.6 C)   Filed Weights   05/03/17 1140  Weight: 218 lb (98.9 kg)    Physical Exam  Constitutional: She is oriented to person, place, and time.  Patient sitting in chemo chair.  HENT:  Head: Normocephalic and atraumatic.  Mouth sore on bottom of inner lip  Eyes: Conjunctivae and EOM are normal. Pupils are equal, round, and reactive to light.  Neck: Normal range of motion. Neck supple.  Cardiovascular: Normal rate, regular rhythm and normal heart sounds.   Pulmonary/Chest: Effort normal and breath sounds normal.  Left chest wall chemoport C/D/I.  Abdominal: Soft. Bowel sounds are normal.  Musculoskeletal: Normal range of motion.  Neurological: She is alert and oriented to person, place, and time. Gait normal.  Skin: Skin is warm and dry. No rash noted.  Nursing note and vitals reviewed.  LABORATORY DATA:  CBC    Component Value Date/Time   WBC 8.6 04/26/2017 1101   RBC 4.06 04/26/2017 1101   HGB 11.4 (L) 04/26/2017 1101   HCT 34.6 (L) 04/26/2017 1101   PLT 210 04/26/2017 1101   MCV 85.2 04/26/2017 1101   MCH 28.1 04/26/2017 1101   MCHC 32.9 04/26/2017 1101   RDW 12.9 04/26/2017 1101   LYMPHSABS 2.3 04/26/2017 1101   MONOABS 0.6 04/26/2017 1101   EOSABS 0.4 04/26/2017 1101   BASOSABS 0.1 04/26/2017 1101     Chemistry      Component Value Date/Time   NA 131 (L) 04/26/2017 1101   K 3.5 04/26/2017 1101   CL 92 (L) 04/26/2017 1101   CO2 26 04/26/2017 1101   BUN 13 04/26/2017 1101   CREATININE 0.84 04/26/2017 1101      Component Value Date/Time   CALCIUM 8.9 04/26/2017 1101   ALKPHOS 60 04/26/2017 1101   AST 21 04/26/2017 1101   ALT 11 (L) 04/26/2017 1101   BILITOT 0.9 04/26/2017 1101      RADIOGRAPHY: I have personally reviewed the radiological images as listed and agreed with the findings in the  report.  03/30/2017    PET Scan 03/27/2017 IMPRESSION: 1. Relatively low-level hypermetabolism corresponding to the pancreatic head mass. 2. Given relatively low-level hypermetabolism within the primary, PET is likely of low sensitivity for metastatic disease. Given this mild limitation, no hypermetabolic metastatic disease identified. 3. Small hiatal hernia 4.  Coronary artery atherosclerosis. Aortic atherosclerosis. 5. Cholelithiasis.  PATHOLOGY:     ASSESSMENT/PLAN:  Stage IB Pancreatic Adenocarcinoma Cancer Staging Malignant neoplasm of pancreas [facility]) Staging form: Exocrine Pancreas, AJCC 8th Edition - Clinical stage from 04/06/2017: Stage IB (cT2, cN0, cM0) - Signed by Twana First, MD on 04/06/2017   PLAN: -She saw Dr. Crisoforo Oxford at [facility] regarding surgical evaluation and he recommended neoadjuvant chemo. Plan to start neoadjuvant gemzar and abraxane for 4 cycles then repeat restaging scans after 2 cycles to see if she's become resectable. - Tolerating chemo well. Proceed with cycle 1 day 8 today. Continue chemo as scheduled. - Advised her to continue taking Dukes mouthwash for mucositis. - Advised her to use her nausea meds PRN. - RTC for follow up with cycle 2 of chemo on 05/24/17.   All questions were answered. The patient knows to call the clinic with any problems, questions or concerns. We can certainly see the patient much sooner if necessary.  This document serves as a record of services personally performed by Twana First, MD. It was created on  her behalf by Shirlean Mylar, a trained medical scribe. The creation of this record is based on the scribe's personal observations and the provider's statements to them. This document has been checked and approved by the attending provider.  I have reviewed the above documentation for accuracy and completeness and I agree with the above.  This note is electronically signed by: Twana First, MD 05/03/2017 11:55  AM

## 2017-05-03 NOTE — Progress Notes (Signed)
Dr. Talbert Cage notified of Renee Jackson 1,300.  Okay to tx today per MD.  Tolerated infusion w/o adverse reaction.  Alert, in no distress.  VSS.  Discharged ambulatory in c/o family.

## 2017-05-03 NOTE — Patient Instructions (Signed)
[facility] Discharge Instructions for Patients Receiving Chemotherapy   Beginning January 23rd 2017 lab work for the [facility] will be done in the  Main lab at [facility] on 1st floor. If you have a lab appointment with the [place] please come in thru the  Main Entrance and check in at the main information desk   Today you received the following chemotherapy agents:  Abraxane and Gemzar  If you develop nausea and vomiting, or diarrhea that is not controlled by your medication, call the clinic.  The clinic phone number is [phone]. Office hours are Monday-Friday 8:30am-5:00pm.  BELOW ARE SYMPTOMS THAT SHOULD BE REPORTED IMMEDIATELY:  *FEVER GREATER THAN 101.0 F  *CHILLS WITH OR WITHOUT FEVER  NAUSEA AND VOMITING THAT IS NOT CONTROLLED WITH YOUR NAUSEA MEDICATION  *UNUSUAL SHORTNESS OF BREATH  *UNUSUAL BRUISING OR BLEEDING  TENDERNESS IN MOUTH AND THROAT WITH OR WITHOUT PRESENCE OF ULCERS  *URINARY PROBLEMS  *BOWEL PROBLEMS  UNUSUAL RASH Items with * indicate a potential emergency and should be followed up as soon as possible. If you have an emergency after office hours please contact your primary care physician or go to the nearest emergency department.  Please call the clinic during office hours if you have any questions or concerns.   You may also contact the Patient Navigator at [phone] should you have any questions or need assistance in obtaining follow up care.      Resources For Cancer Patients and their Caregivers ? American Cancer Society: Can assist with transportation, wigs, general needs, runs Look Good Feel Better.        3022127287 ? Cancer Care: Provides financial assistance, online support groups, medication/co-pay assistance.  1-800-813-HOPE 770-085-0455) ? [employer] Assists [place] cancer patients and their families through emotional , educational and financial support.   714-601-1764 ? [place] DSS Where to apply for food stamps, Medicaid and utility assistance. (518)247-2867 ? RCATS: Transportation to medical appointments. (925) 858-1953 ? Social Security Administration: May apply for disability if have a Stage IV cancer. 986-325-0436 ([phone] ? [employer], Disability and Transit Services: Assists with nutrition, care and transit needs. [phone]

## 2017-05-10 ENCOUNTER — Encounter (HOSPITAL_BASED_OUTPATIENT_CLINIC_OR_DEPARTMENT_OTHER): Payer: Medicare Other

## 2017-05-10 ENCOUNTER — Encounter (HOSPITAL_COMMUNITY): Payer: Self-pay

## 2017-05-10 VITALS — BP 148/44 | HR 81 | Temp 98.0°F | Resp 18

## 2017-05-10 DIAGNOSIS — K869 Disease of pancreas, unspecified: Secondary | ICD-10-CM | POA: Diagnosis not present

## 2017-05-10 DIAGNOSIS — Z5111 Encounter for antineoplastic chemotherapy: Secondary | ICD-10-CM

## 2017-05-10 DIAGNOSIS — C259 Malignant neoplasm of pancreas, unspecified: Secondary | ICD-10-CM

## 2017-05-10 DIAGNOSIS — K8689 Other specified diseases of pancreas: Secondary | ICD-10-CM

## 2017-05-10 LAB — COMPREHENSIVE METABOLIC PANEL
ALBUMIN: 3.4 g/dL — AB (ref 3.5–5.0)
ALK PHOS: 70 U/L (ref 38–126)
ALT: 54 U/L (ref 14–54)
AST: 56 U/L — AB (ref 15–41)
Anion gap: 12 (ref 5–15)
BILIRUBIN TOTAL: 0.5 mg/dL (ref 0.3–1.2)
BUN: 14 mg/dL (ref 6–20)
CALCIUM: 8.9 mg/dL (ref 8.9–10.3)
CO2: 25 mmol/L (ref 22–32)
Chloride: 91 mmol/L — ABNORMAL LOW (ref 101–111)
Creatinine, Ser: 1.02 mg/dL — ABNORMAL HIGH (ref 0.44–1.00)
GFR calc Af Amer: >60 mL/min (ref >60)
GFR calc non Af Amer: 55 mL/min — ABNORMAL LOW (ref >60)
Glucose, Bld: 324 mg/dL — ABNORMAL HIGH (ref 65–99)
Potassium: 4.1 mmol/L (ref 3.5–5.1)
Sodium: 128 mmol/L — ABNORMAL LOW (ref 135–145)
TOTAL PROTEIN: 6.2 g/dL — AB (ref 6.5–8.1)

## 2017-05-10 LAB — CBC WITH DIFFERENTIAL/PLATELET
BASOS ABS: 0 10*3/uL (ref 0.0–0.1)
BASOS PCT: 0 %
Eosinophils Absolute: 0.1 10*3/uL (ref 0.0–0.7)
Eosinophils Relative: 2 %
HEMATOCRIT: 28.7 % — AB (ref 36.0–46.0)
HEMOGLOBIN: 9.6 g/dL — AB (ref 12.0–15.0)
Lymphocytes Relative: 33 %
Lymphs Abs: 1.5 10*3/uL (ref 0.7–4.0)
MCH: 28.4 pg (ref 26.0–34.0)
MCHC: 33.4 g/dL (ref 30.0–36.0)
MCV: 84.9 fL (ref 78.0–100.0)
Monocytes Absolute: 0.3 10*3/uL (ref 0.1–1.0)
Monocytes Relative: 6 %
NEUTROS ABS: 2.7 10*3/uL (ref 1.7–7.7)
NEUTROS PCT: 59 %
Platelets: 130 10*3/uL — ABNORMAL LOW (ref 150–400)
RBC: 3.38 MIL/uL — AB (ref 3.87–5.11)
RDW: 13.2 % (ref 11.5–15.5)
WBC: 4.5 10*3/uL (ref 4.0–10.5)

## 2017-05-10 MED ORDER — PACLITAXEL PROTEIN-BOUND CHEMO INJECTION 100 MG
125.0000 mg/m2 | Freq: Once | INTRAVENOUS | Status: AC
  Administered 2017-05-10: 250 mg via INTRAVENOUS
  Filled 2017-05-10: qty 50

## 2017-05-10 MED ORDER — SODIUM CHLORIDE 0.9 % IV SOLN
Freq: Once | INTRAVENOUS | Status: AC
  Administered 2017-05-10: 13:00:00 via INTRAVENOUS

## 2017-05-10 MED ORDER — PROCHLORPERAZINE MALEATE 10 MG PO TABS
ORAL_TABLET | ORAL | Status: AC
  Filled 2017-05-10: qty 1

## 2017-05-10 MED ORDER — SODIUM CHLORIDE 0.9 % IV SOLN
1000.0000 mg/m2 | Freq: Once | INTRAVENOUS | Status: AC
  Administered 2017-05-10: 2090 mg via INTRAVENOUS
  Filled 2017-05-10: qty 54.97

## 2017-05-10 MED ORDER — HEPARIN SOD (PORK) LOCK FLUSH 100 UNIT/ML IV SOLN
500.0000 [IU] | Freq: Once | INTRAVENOUS | Status: AC | PRN
  Administered 2017-05-10: 500 [IU]
  Filled 2017-05-10: qty 5

## 2017-05-10 MED ORDER — PROCHLORPERAZINE MALEATE 10 MG PO TABS
10.0000 mg | ORAL_TABLET | Freq: Once | ORAL | Status: AC
  Administered 2017-05-10: 10 mg via ORAL

## 2017-05-10 NOTE — Progress Notes (Signed)
Tolerated tx w/o adverse reaction.  Alert, in no distress.  VSS.  Discharged ambulatory in c/o family.  

## 2017-05-10 NOTE — Patient Instructions (Signed)
[facility] Discharge Instructions for Patients Receiving Chemotherapy   Beginning January 23rd 2017 lab work for the [facility] will be done in the  Main lab at [facility] on 1st floor. If you have a lab appointment with the [place] please come in thru the  Main Entrance and check in at the main information desk   Today you received the following chemotherapy agents:  Abraxane and Gemzar  Use hydrocortisone 1% (over the counter) for rash as instructed by G. Renato Battles, NP.  If you develop nausea and vomiting, or diarrhea that is not controlled by your medication, call the clinic.  The clinic phone number is [phone]. Office hours are Monday-Friday 8:30am-5:00pm.  BELOW ARE SYMPTOMS THAT SHOULD BE REPORTED IMMEDIATELY:  *FEVER GREATER THAN 101.0 F  *CHILLS WITH OR WITHOUT FEVER  NAUSEA AND VOMITING THAT IS NOT CONTROLLED WITH YOUR NAUSEA MEDICATION  *UNUSUAL SHORTNESS OF BREATH  *UNUSUAL BRUISING OR BLEEDING  TENDERNESS IN MOUTH AND THROAT WITH OR WITHOUT PRESENCE OF ULCERS  *URINARY PROBLEMS  *BOWEL PROBLEMS  UNUSUAL RASH Items with * indicate a potential emergency and should be followed up as soon as possible. If you have an emergency after office hours please contact your primary care physician or go to the nearest emergency department.  Please call the clinic during office hours if you have any questions or concerns.   You may also contact the Patient Navigator at [phone] should you have any questions or need assistance in obtaining follow up care.      Resources For Cancer Patients and their Caregivers ? American Cancer Society: Can assist with transportation, wigs, general needs, runs Look Good Feel Better.        573-197-2098 ? Cancer Care: Provides financial assistance, online support groups, medication/co-pay assistance.  1-800-813-HOPE (609)331-7407) ? [employer] Assists [place] cancer patients and  their families through emotional , educational and financial support.  (925) 432-1254 ? [place] DSS Where to apply for food stamps, Medicaid and utility assistance. 719-298-8626 ? RCATS: Transportation to medical appointments. (605) 228-6989 ? Social Security Administration: May apply for disability if have a Stage IV cancer. (628)598-2647 480-005-4951 ? [employer], Disability and Transit Services: Assists with nutrition, care and transit needs. [phone]

## 2017-05-12 NOTE — Addendum Note (Signed)
Addendum  created 05/12/17 1402 by Jeet Shough, MD   Sign clinical note    

## 2017-05-16 ENCOUNTER — Other Ambulatory Visit (HOSPITAL_COMMUNITY): Payer: Self-pay | Admitting: Emergency Medicine

## 2017-05-16 ENCOUNTER — Encounter (HOSPITAL_COMMUNITY): Payer: Self-pay

## 2017-05-16 ENCOUNTER — Other Ambulatory Visit: Payer: Self-pay

## 2017-05-16 ENCOUNTER — Inpatient Hospital Stay (HOSPITAL_COMMUNITY)
Admission: EM | Admit: 2017-05-16 | Discharge: 2017-05-19 | DRG: 683 | Disposition: A | Discharge status: Home / Self Care | Payer: Medicare Other | Attending: Family Medicine | Admitting: Family Medicine

## 2017-05-16 DIAGNOSIS — T451X5A Adverse effect of antineoplastic and immunosuppressive drugs, initial encounter: Secondary | ICD-10-CM | POA: Diagnosis present

## 2017-05-16 DIAGNOSIS — Z9221 Personal history of antineoplastic chemotherapy: Secondary | ICD-10-CM

## 2017-05-16 DIAGNOSIS — F329 Major depressive disorder, single episode, unspecified: Secondary | ICD-10-CM | POA: Diagnosis present

## 2017-05-16 DIAGNOSIS — E872 Acidosis, unspecified: Secondary | ICD-10-CM

## 2017-05-16 DIAGNOSIS — D649 Anemia, unspecified: Secondary | ICD-10-CM

## 2017-05-16 DIAGNOSIS — Z79899 Other long term (current) drug therapy: Secondary | ICD-10-CM

## 2017-05-16 DIAGNOSIS — E860 Dehydration: Secondary | ICD-10-CM | POA: Diagnosis present

## 2017-05-16 DIAGNOSIS — E119 Type 2 diabetes mellitus without complications: Secondary | ICD-10-CM

## 2017-05-16 DIAGNOSIS — G8929 Other chronic pain: Secondary | ICD-10-CM | POA: Diagnosis present

## 2017-05-16 DIAGNOSIS — Z882 Allergy status to sulfonamides status: Secondary | ICD-10-CM

## 2017-05-16 DIAGNOSIS — K219 Gastro-esophageal reflux disease without esophagitis: Secondary | ICD-10-CM | POA: Diagnosis present

## 2017-05-16 DIAGNOSIS — E1142 Type 2 diabetes mellitus with diabetic polyneuropathy: Secondary | ICD-10-CM | POA: Diagnosis present

## 2017-05-16 DIAGNOSIS — N179 Acute kidney failure, unspecified: Secondary | ICD-10-CM | POA: Diagnosis not present

## 2017-05-16 DIAGNOSIS — D630 Anemia in neoplastic disease: Secondary | ICD-10-CM | POA: Diagnosis present

## 2017-05-16 DIAGNOSIS — R627 Adult failure to thrive: Secondary | ICD-10-CM | POA: Diagnosis present

## 2017-05-16 DIAGNOSIS — R197 Diarrhea, unspecified: Secondary | ICD-10-CM

## 2017-05-16 DIAGNOSIS — Z87891 Personal history of nicotine dependence: Secondary | ICD-10-CM

## 2017-05-16 DIAGNOSIS — Z7982 Long term (current) use of aspirin: Secondary | ICD-10-CM

## 2017-05-16 DIAGNOSIS — E871 Hypo-osmolality and hyponatremia: Secondary | ICD-10-CM | POA: Diagnosis present

## 2017-05-16 DIAGNOSIS — C259 Malignant neoplasm of pancreas, unspecified: Secondary | ICD-10-CM | POA: Diagnosis present

## 2017-05-16 DIAGNOSIS — Z7984 Long term (current) use of oral hypoglycemic drugs: Secondary | ICD-10-CM

## 2017-05-16 DIAGNOSIS — J449 Chronic obstructive pulmonary disease, unspecified: Secondary | ICD-10-CM | POA: Diagnosis present

## 2017-05-16 DIAGNOSIS — M545 Low back pain: Secondary | ICD-10-CM | POA: Diagnosis present

## 2017-05-16 DIAGNOSIS — I4891 Unspecified atrial fibrillation: Secondary | ICD-10-CM | POA: Diagnosis present

## 2017-05-16 DIAGNOSIS — E785 Hyperlipidemia, unspecified: Secondary | ICD-10-CM | POA: Diagnosis present

## 2017-05-16 DIAGNOSIS — Z66 Do not resuscitate: Secondary | ICD-10-CM | POA: Diagnosis present

## 2017-05-16 DIAGNOSIS — I10 Essential (primary) hypertension: Secondary | ICD-10-CM | POA: Diagnosis present

## 2017-05-16 LAB — CBC
HCT: 24.1 % — ABNORMAL LOW (ref 36.0–46.0)
Hemoglobin: 8.2 g/dL — ABNORMAL LOW (ref 12.0–15.0)
MCH: 28.4 pg (ref 26.0–34.0)
MCHC: 34 g/dL (ref 30.0–36.0)
MCV: 83.4 fL (ref 78.0–100.0)
PLATELETS: 149 10*3/uL — AB (ref 150–400)
RBC: 2.89 MIL/uL — ABNORMAL LOW (ref 3.87–5.11)
RDW: 14.2 % (ref 11.5–15.5)
WBC: 7.5 10*3/uL (ref 4.0–10.5)

## 2017-05-16 LAB — CBG MONITORING, ED: Glucose-Capillary: 215 mg/dL — ABNORMAL HIGH (ref 65–99)

## 2017-05-16 MED ORDER — DIPHENOXYLATE-ATROPINE 2.5-0.025 MG PO TABS: 1.0000 | ORAL_TABLET | Freq: Four times a day (QID) | ORAL | 1 refills | Status: AC | PRN

## 2017-05-16 NOTE — ED Notes (Signed)
Pt ambulated to bathroom in attempt to provide urine sample, pt states she is too dehydrated to provide sample at this time. This tech informed pt to let nursing staff know when able to provide sample

## 2017-05-16 NOTE — ED Triage Notes (Signed)
Pt states that for several days she has not been able to eat and feels tired.  (-) N/V

## 2017-05-16 NOTE — ED Notes (Signed)
Ice placed on chest for port access numbing

## 2017-05-16 NOTE — ED Provider Notes (Addendum)
[place] DEPT Provider Note   CSN: [id number] Arrival date & time: 05/16/17  2134     History   Chief Complaint Chief Complaint  Patient presents with  . Fatigue    HPI Renee Jackson is a 71 y.o. female with a past medical history of pancreatic cancer. Patient underwent her third chemotherapy, round, this past week. She is here with her daughter states that she has had more confusion, lethargy, profuse diarrhea, early satiety. The patient has not been wanting to eat or drink at all. She notes that her mother has been extremely lethargic. She denies fevers. She was told to come in for evaluation by the cancer center, [facility] today.  HPI  Past Medical History:  Diagnosis Date  . Arthritis    hands  . Asthma   . Atrial fibrillation ([place])   . Cancer [facility])    pancreatic  . Chronic lower back pain   . COPD (chronic obstructive pulmonary disease) ([place])   . Depression   . Diabetes ([place])    type 2  . Diabetic neuropathy (HCC)    feet and legs  . Dyspnea   . GERD (gastroesophageal reflux disease)   . Headache    migraines  . Hypercholesteremia   . Hypertension   . Neuropathy    both feet  . Pancreatic mass 03/21/2017  . Pancreatic mass   . Pneumonia 1995 severe case   2 times before that  . Seizures ([place]) as child  to age 25   with high fevers  . Sleep apnea    cpap uses sometimes  . Umbilical hernia     Patient Active Problem List   Diagnosis Date Noted  . Malignant neoplasm of pancreas ([place]) 04/06/2017  . Pancreatic mass 03/21/2017  . Type 2 diabetes mellitus ([place]) 01/24/2017  . Hypertensive disorder 01/24/2017  . Depressive disorder 01/24/2017  . Hypercholesterolemia 01/24/2017  . Chronic obstructive lung disease ([place]) 01/24/2017  . Asthma 01/24/2017    Past Surgical History:  Procedure Laterality Date  . CESAREAN SECTION     x 1  . EUS N/A 03/30/2017   Procedure: UPPER ENDOSCOPIC ULTRASOUND (EUS) LINEAR;  Surgeon: Milus Banister, MD;  Location: [facility]  ENDOSCOPY;  Service: Endoscopy;  Laterality: N/A;  . HERNIA REPAIR     x 2 umbilical   . PORTACATH PLACEMENT N/A 04/21/2017   Procedure: INSERTION PORT-A-CATH;  Surgeon: Aviva Signs, MD;  Location: [facility] ORS;  Service: General;  Laterality: N/A;  . UMBILICAL HERNIA REPAIR     multiple times    OB History    No data available       Home Medications    Prior to Admission medications   Medication Sig Start Date End Date Taking? Authorizing Provider  aspirin EC 81 MG tablet Take 81 mg by mouth daily.   Yes [provider]  atorvastatin (LIPITOR) 10 MG tablet Take 10 mg by mouth every morning.  01/28/17  Yes [provider]  cetirizine (ZYRTEC) 10 MG tablet Take 10 mg by mouth at bedtime.   Yes [provider]  citalopram (CELEXA) 20 MG tablet Take 20 mg by mouth every morning.  01/28/17  Yes [provider]  Diphenhyd-Hydrocort-Nystatin (FIRST-DUKES MOUTHWASH) SUSP Use as directed 5 mLs in the mouth or throat every 4 (four) hours as needed. 05/02/17  Yes Holley Bouche, NP  diphenoxylate-atropine (LOMOTIL) 2.5-0.025 MG tablet Take 1 tablet by mouth 4 (four) times daily as needed for diarrhea or loose stools.  05/16/17  Yes Kefalas, Manon Hilding, PA-C  gabapentin (NEURONTIN) 300 MG capsule Take 300 mg by mouth 2 (two) times daily. 04/27/17  Yes [provider]  Gemcitabine HCl (GEMZAR IV) Inject into the vein. Day 1, 8, 15 every 28 days   Yes [provider]  glipiZIDE (GLUCOTROL XL) 10 MG 24 hr tablet Take 10 mg by mouth daily with breakfast.  02/08/17  Yes [provider]  hydrochlorothiazide (HYDRODIURIL) 25 MG tablet Take 25 mg by mouth at bedtime.  02/14/17  Yes [provider]  HYDROcodone-acetaminophen (NORCO/VICODIN) 5-325 MG tablet Take 0.5 tablets by mouth every 6 (six) hours as needed for moderate pain.  03/18/17  Yes [provider]  lidocaine (XYLOCAINE) 2 % solution Use as directed 5 mLs in the mouth or throat  every 4 (four) hours as needed for mouth pain. Mixed with Duke's Magic Mouthwash 05/02/17  Yes [provider]  lidocaine-prilocaine (EMLA) cream Apply to affected area once 04/13/17  Yes Twana First, MD  losartan (COZAAR) 100 MG tablet Take 100 mg by mouth every morning.  01/28/17  Yes [provider]  metFORMIN (GLUCOPHAGE) 1000 MG tablet Take 1,000 mg by mouth 2 (two) times daily with a meal.  02/27/17  Yes [provider]  metoprolol succinate (TOPROL-XL) 50 MG 24 hr tablet Take 50 mg by mouth every morning.  01/28/17  Yes [provider]  omeprazole (PRILOSEC) 20 MG capsule Take 40 mg by mouth every morning.  02/08/17  Yes [provider]  ondansetron (ZOFRAN) 8 MG tablet Take 1 tablet (8 mg total) by mouth 2 (two) times daily as needed (Nausea or vomiting). 04/13/17  Yes Twana First, MD  PACLitaxel Protein-Bound Part (ABRAXANE IV) Inject into the vein. Day 1, 8, 15 every 28 days   Yes [provider]  promethazine (PHENERGAN) 25 MG tablet Take 25 mg by mouth every 6 (six) hours as needed for nausea or vomiting.  03/18/17  Yes [provider]  Simethicone (ALKA-SELTZER ANTI-GAS PO) Take 1 tablet by mouth daily as needed (gas).   Yes [provider]  LORazepam (ATIVAN) 0.5 MG tablet Take 1 tablet (0.5 mg total) by mouth every 6 (six) hours as needed (Nausea or vomiting). 04/13/17   Twana First, MD  montelukast (SINGULAIR) 10 MG tablet Take 10 mg by mouth every morning.  02/08/17   [provider]  Multiple Vitamins-Minerals (MULTIVITAMIN GUMMIES ADULT) CHEW Chew 2 tablets by mouth daily.    [provider]    Family History Family History  Problem Relation Age of Onset  . Heart disease Mother   . Dementia Father     Social History Social History  Substance Use Topics  . Smoking status: Former Smoker    Packs/day: 1.00    Years: 20.00    Types: Cigarettes    Quit date: 03/21/1994  . Smokeless tobacco: Never  Used  . Alcohol use No     Allergies   Sulfa antibiotics   Review of Systems Review of Systems Ten systems reviewed and are negative for acute change, except as noted in the HPI.    Physical Exam Updated Vital Signs BP (!) 94/53   Pulse 87   Temp 98.1 F (36.7 C) (Oral)   Resp 18   Wt 94.3 kg (208 lb)   SpO2 98%   BMI 38.04 kg/m   Physical Exam  Constitutional: She is oriented to person, place, and time. She appears well-developed and well-nourished. No distress.  Appears ill  HENT:  Head: Normocephalic and atraumatic.  Eyes: Conjunctivae are normal. No scleral icterus.  Neck: Normal range of motion.  Cardiovascular: Normal rate, regular rhythm and normal heart sounds.  Exam reveals no gallop and no friction rub.   No murmur heard. Pulmonary/Chest: Effort normal and breath sounds normal. No respiratory distress.  Abdominal: Soft. Bowel sounds are normal. She exhibits no distension and no mass. There is tenderness. There is no guarding.  Neurological: She is alert and oriented to person, place, and time.  Skin: Skin is warm and dry. She is not diaphoretic.  Psychiatric: Her behavior is normal.  Nursing note and vitals reviewed.    ED Treatments / Results  Labs (all labs ordered are listed, but only abnormal results are displayed) Labs Reviewed  CBG MONITORING, ED - Abnormal; Notable for the following:       Result Value   Glucose-Capillary 215 (*)    All other components within normal limits  CBC  URINALYSIS, ROUTINE W REFLEX MICROSCOPIC  COMPREHENSIVE METABOLIC PANEL  LIPASE, BLOOD    EKG  EKG Interpretation None       Radiology No results found.  Procedures Procedures (including critical care time)  Medications Ordered in ED Medications - No data to display   Initial Impression / Assessment and Plan / ED Course  I have reviewed the triage vital signs and the nursing notes.  Pertinent labs & imaging results that were available during my  care of the patient were reviewed by me and considered in my medical decision making (see chart for details).  Clinical Course as of May 18 111  Wed May 17, 2017  0045 Patient with a 4 gram drop in her HGB over the past 3 weeks. Concerning for symptomatic anemia Hemoglobin: (!) 8.2 [AH]  0045 Sodium: (!) 123 [AH]  0046 AKI  Creatinine: (!) 3.39 [AH]  0057 Lactic acidosis I believe is secondary to her dehydration. Her poc stool test is pending. Lactic Acid, Venous: (!!) 2.3 [AH]    Clinical Course User Index [AH] Margarita Mail, PA-C    Patient will be admitted for acute kidney injury. She has anemia, hyponatremia, stool culture is weakly positive. Her lactic acid is elevated, but I think this is secondary to extraordinary her dehydration. She does not have an elevated white count or fever. She is not tachycardic. Although her blood pressure is documented at 86. I have been in the room several times and her blood pressure on the monitor is continually about 119. I feel this is a false reading.  Final Clinical Impressions(s) / ED Diagnoses   Final diagnoses:  None    New Prescriptions New Prescriptions   No medications on file     Ned Grace 05/17/17 0115    Ezequiel Essex, MD 05/17/17 [place], [place], PA-C 06/08/17 0623    Ezequiel Essex, MD 06/08/17 [phone]

## 2017-05-16 NOTE — Progress Notes (Unsigned)
Renee Jackson called and stated that Renee Jackson was very weak, still having diarrhea despite taking the imodium, falling, rash has not gone away, not able to eat, and her legs feel like jello.  Spoke with Renee Crigler PA, ordered lomotil, fluids and lab work, and can apply hydrocortisone cream to rash.  Tried to get fluids set up in short stay tomorrow and they can not do them, as our schedule is booked.  Scheduled her for fluids and labs on Thursday at 11:15 am.  Renee Jackson that if she dont think she can wait that long to come in to the ER.  She verbalized understanding.

## 2017-05-17 ENCOUNTER — Encounter (HOSPITAL_COMMUNITY): Payer: Self-pay | Admitting: *Deleted

## 2017-05-17 DIAGNOSIS — D630 Anemia in neoplastic disease: Secondary | ICD-10-CM | POA: Diagnosis present

## 2017-05-17 DIAGNOSIS — J449 Chronic obstructive pulmonary disease, unspecified: Secondary | ICD-10-CM | POA: Diagnosis present

## 2017-05-17 DIAGNOSIS — M545 Low back pain: Secondary | ICD-10-CM | POA: Diagnosis present

## 2017-05-17 DIAGNOSIS — R197 Diarrhea, unspecified: Secondary | ICD-10-CM | POA: Diagnosis present

## 2017-05-17 DIAGNOSIS — C259 Malignant neoplasm of pancreas, unspecified: Secondary | ICD-10-CM | POA: Diagnosis present

## 2017-05-17 DIAGNOSIS — Z7982 Long term (current) use of aspirin: Secondary | ICD-10-CM | POA: Diagnosis not present

## 2017-05-17 DIAGNOSIS — I10 Essential (primary) hypertension: Secondary | ICD-10-CM | POA: Diagnosis present

## 2017-05-17 DIAGNOSIS — E871 Hypo-osmolality and hyponatremia: Secondary | ICD-10-CM | POA: Diagnosis present

## 2017-05-17 DIAGNOSIS — I4891 Unspecified atrial fibrillation: Secondary | ICD-10-CM | POA: Diagnosis present

## 2017-05-17 DIAGNOSIS — N179 Acute kidney failure, unspecified: Secondary | ICD-10-CM | POA: Diagnosis present

## 2017-05-17 DIAGNOSIS — K219 Gastro-esophageal reflux disease without esophagitis: Secondary | ICD-10-CM | POA: Diagnosis present

## 2017-05-17 DIAGNOSIS — Z79899 Other long term (current) drug therapy: Secondary | ICD-10-CM | POA: Diagnosis not present

## 2017-05-17 DIAGNOSIS — Z7984 Long term (current) use of oral hypoglycemic drugs: Secondary | ICD-10-CM | POA: Diagnosis not present

## 2017-05-17 DIAGNOSIS — E785 Hyperlipidemia, unspecified: Secondary | ICD-10-CM | POA: Diagnosis present

## 2017-05-17 DIAGNOSIS — E860 Dehydration: Secondary | ICD-10-CM | POA: Diagnosis present

## 2017-05-17 DIAGNOSIS — E872 Acidosis: Secondary | ICD-10-CM | POA: Diagnosis present

## 2017-05-17 DIAGNOSIS — E119 Type 2 diabetes mellitus without complications: Secondary | ICD-10-CM | POA: Diagnosis not present

## 2017-05-17 DIAGNOSIS — G8929 Other chronic pain: Secondary | ICD-10-CM | POA: Diagnosis present

## 2017-05-17 DIAGNOSIS — T451X5A Adverse effect of antineoplastic and immunosuppressive drugs, initial encounter: Secondary | ICD-10-CM | POA: Diagnosis present

## 2017-05-17 DIAGNOSIS — D649 Anemia, unspecified: Secondary | ICD-10-CM

## 2017-05-17 DIAGNOSIS — Z87891 Personal history of nicotine dependence: Secondary | ICD-10-CM | POA: Diagnosis not present

## 2017-05-17 DIAGNOSIS — Z66 Do not resuscitate: Secondary | ICD-10-CM | POA: Diagnosis present

## 2017-05-17 DIAGNOSIS — E1142 Type 2 diabetes mellitus with diabetic polyneuropathy: Secondary | ICD-10-CM | POA: Diagnosis present

## 2017-05-17 DIAGNOSIS — Z882 Allergy status to sulfonamides status: Secondary | ICD-10-CM | POA: Diagnosis not present

## 2017-05-17 DIAGNOSIS — R627 Adult failure to thrive: Secondary | ICD-10-CM | POA: Diagnosis present

## 2017-05-17 DIAGNOSIS — Z9221 Personal history of antineoplastic chemotherapy: Secondary | ICD-10-CM | POA: Diagnosis not present

## 2017-05-17 DIAGNOSIS — F329 Major depressive disorder, single episode, unspecified: Secondary | ICD-10-CM | POA: Diagnosis present

## 2017-05-17 LAB — COMPREHENSIVE METABOLIC PANEL
ALBUMIN: 2.5 g/dL — AB (ref 3.5–5.0)
ALBUMIN: 2.4 g/dL — AB (ref 3.5–5.0)
ALT: 27 U/L (ref 14–54)
ALT: 24 U/L (ref 14–54)
AST: 22 U/L (ref 15–41)
AST: 24 U/L (ref 15–41)
Alkaline Phosphatase: 73 U/L (ref 38–126)
Alkaline Phosphatase: 75 U/L (ref 38–126)
Anion gap: 11 (ref 5–15)
Anion gap: 10 (ref 5–15)
BILIRUBIN TOTAL: 0.4 mg/dL (ref 0.3–1.2)
BILIRUBIN TOTAL: 0.5 mg/dL (ref 0.3–1.2)
BUN: 33 mg/dL — AB (ref 6–20)
BUN: 31 mg/dL — AB (ref 6–20)
CHLORIDE: 90 mmol/L — AB (ref 101–111)
CO2: 22 mmol/L (ref 22–32)
CO2: 21 mmol/L — ABNORMAL LOW (ref 22–32)
CREATININE: 3.39 mg/dL — AB (ref 0.44–1.00)
CREATININE: 3.17 mg/dL — AB (ref 0.44–1.00)
Calcium: 7.6 mg/dL — ABNORMAL LOW (ref 8.9–10.3)
Calcium: 7.2 mg/dL — ABNORMAL LOW (ref 8.9–10.3)
Chloride: 90 mmol/L — ABNORMAL LOW (ref 101–111)
GFR calc Af Amer: 15 mL/min — ABNORMAL LOW (ref >60)
GFR calc Af Amer: 16 mL/min — ABNORMAL LOW (ref >60)
GFR calc non Af Amer: 13 mL/min — ABNORMAL LOW (ref >60)
GFR calc non Af Amer: 14 mL/min — ABNORMAL LOW (ref >60)
GLUCOSE: 216 mg/dL — AB (ref 65–99)
GLUCOSE: 176 mg/dL — AB (ref 65–99)
POTASSIUM: 4.6 mmol/L (ref 3.5–5.1)
POTASSIUM: 4 mmol/L (ref 3.5–5.1)
Sodium: 123 mmol/L — ABNORMAL LOW (ref 135–145)
Sodium: 121 mmol/L — ABNORMAL LOW (ref 135–145)
TOTAL PROTEIN: 5.1 g/dL — AB (ref 6.5–8.1)
Total Protein: 5.4 g/dL — ABNORMAL LOW (ref 6.5–8.1)

## 2017-05-17 LAB — BASIC METABOLIC PANEL
ANION GAP: 9 (ref 5–15)
ANION GAP: 8 (ref 5–15)
ANION GAP: 9 (ref 5–15)
BUN: 29 mg/dL — ABNORMAL HIGH (ref 6–20)
BUN: 27 mg/dL — ABNORMAL HIGH (ref 6–20)
BUN: 24 mg/dL — ABNORMAL HIGH (ref 6–20)
CALCIUM: 7.1 mg/dL — AB (ref 8.9–10.3)
CALCIUM: 7 mg/dL — AB (ref 8.9–10.3)
CALCIUM: 6.8 mg/dL — AB (ref 8.9–10.3)
CHLORIDE: 91 mmol/L — AB (ref 101–111)
CHLORIDE: 92 mmol/L — AB (ref 101–111)
CHLORIDE: 93 mmol/L — AB (ref 101–111)
CO2: 21 mmol/L — AB (ref 22–32)
CO2: 22 mmol/L (ref 22–32)
CO2: 19 mmol/L — AB (ref 22–32)
CREATININE: 2.64 mg/dL — AB (ref 0.44–1.00)
Creatinine, Ser: 2.41 mg/dL — ABNORMAL HIGH (ref 0.44–1.00)
Creatinine, Ser: 1.96 mg/dL — ABNORMAL HIGH (ref 0.44–1.00)
GFR calc Af Amer: 20 mL/min — ABNORMAL LOW (ref >60)
GFR calc Af Amer: 22 mL/min — ABNORMAL LOW (ref >60)
GFR calc non Af Amer: 17 mL/min — ABNORMAL LOW (ref >60)
GFR calc non Af Amer: 19 mL/min — ABNORMAL LOW (ref >60)
GFR calc non Af Amer: 25 mL/min — ABNORMAL LOW (ref >60)
GFR, EST AFRICAN AMERICAN: 29 mL/min — AB (ref >60)
GLUCOSE: 155 mg/dL — AB (ref 65–99)
GLUCOSE: 170 mg/dL — AB (ref 65–99)
GLUCOSE: 140 mg/dL — AB (ref 65–99)
Potassium: 3.4 mmol/L — ABNORMAL LOW (ref 3.5–5.1)
Potassium: 4.3 mmol/L (ref 3.5–5.1)
Potassium: 3.9 mmol/L (ref 3.5–5.1)
Sodium: 121 mmol/L — ABNORMAL LOW (ref 135–145)
Sodium: 122 mmol/L — ABNORMAL LOW (ref 135–145)
Sodium: 121 mmol/L — ABNORMAL LOW (ref 135–145)

## 2017-05-17 LAB — CBC
HEMATOCRIT: 23.4 % — AB (ref 36.0–46.0)
HEMOGLOBIN: 8 g/dL — AB (ref 12.0–15.0)
MCH: 28.8 pg (ref 26.0–34.0)
MCHC: 34.2 g/dL (ref 30.0–36.0)
MCV: 84.2 fL (ref 78.0–100.0)
Platelets: 152 10*3/uL (ref 150–400)
RBC: 2.78 MIL/uL — AB (ref 3.87–5.11)
RDW: 14.2 % (ref 11.5–15.5)
WBC: 6.4 10*3/uL (ref 4.0–10.5)

## 2017-05-17 LAB — URINALYSIS, ROUTINE W REFLEX MICROSCOPIC
Bilirubin Urine: NEGATIVE
GLUCOSE, UA: 100 mg/dL — AB
Hgb urine dipstick: NEGATIVE
NITRITE: NEGATIVE
Protein, ur: NEGATIVE mg/dL
Specific Gravity, Urine: 1.025 (ref 1.005–1.030)
pH: 5 (ref 5.0–8.0)

## 2017-05-17 LAB — C DIFFICILE QUICK SCREEN W PCR REFLEX
C DIFFICILE (CDIFF) INTERP: NOT DETECTED
C DIFFICILE (CDIFF) TOXIN: NEGATIVE
C Diff antigen: NEGATIVE

## 2017-05-17 LAB — GLUCOSE, CAPILLARY
GLUCOSE-CAPILLARY: 151 mg/dL — AB (ref 65–99)
GLUCOSE-CAPILLARY: 130 mg/dL — AB (ref 65–99)
Glucose-Capillary: 128 mg/dL — ABNORMAL HIGH (ref 65–99)
Glucose-Capillary: 157 mg/dL — ABNORMAL HIGH (ref 65–99)

## 2017-05-17 LAB — OSMOLALITY: Osmolality: 267 mOsm/kg — ABNORMAL LOW (ref 275–295)

## 2017-05-17 LAB — LACTIC ACID, PLASMA
LACTIC ACID, VENOUS: 2.2 mmol/L — AB (ref 0.5–1.9)
Lactic Acid, Venous: 2.3 mmol/L (ref 0.5–1.9)

## 2017-05-17 LAB — POC OCCULT BLOOD, ED: FECAL OCCULT BLD: POSITIVE — AB

## 2017-05-17 LAB — LIPASE, BLOOD: Lipase: 12 U/L (ref 11–51)

## 2017-05-17 LAB — URINALYSIS, MICROSCOPIC (REFLEX)

## 2017-05-17 LAB — MRSA PCR SCREENING: MRSA BY PCR: NEGATIVE

## 2017-05-17 LAB — AMMONIA: Ammonia: 13 umol/L (ref 9–35)

## 2017-05-17 MED ORDER — SODIUM CHLORIDE 0.9 % IV BOLUS (SEPSIS)
2000.0000 mL | Freq: Once | INTRAVENOUS | Status: AC
  Administered 2017-05-17: 1000 mL via INTRAVENOUS

## 2017-05-17 MED ORDER — ASPIRIN EC 81 MG PO TBEC
81.0000 mg | DELAYED_RELEASE_TABLET | Freq: Every day | ORAL | Status: DC
  Administered 2017-05-17 – 2017-05-19 (×3): 81 mg via ORAL
  Filled 2017-05-17 (×3): qty 1

## 2017-05-17 MED ORDER — GI COCKTAIL ~~LOC~~
30.0000 mL | Freq: Once | ORAL | Status: AC
  Administered 2017-05-17: 30 mL via ORAL
  Filled 2017-05-17: qty 30

## 2017-05-17 MED ORDER — ONDANSETRON HCL 4 MG/2ML IJ SOLN
4.0000 mg | Freq: Four times a day (QID) | INTRAMUSCULAR | Status: DC | PRN
  Administered 2017-05-17: 4 mg via INTRAVENOUS
  Filled 2017-05-17: qty 2

## 2017-05-17 MED ORDER — PANTOPRAZOLE SODIUM 40 MG PO TBEC
40.0000 mg | DELAYED_RELEASE_TABLET | Freq: Every day | ORAL | Status: DC
  Administered 2017-05-17 – 2017-05-19 (×3): 40 mg via ORAL
  Filled 2017-05-17 (×3): qty 1

## 2017-05-17 MED ORDER — LORATADINE 10 MG PO TABS
10.0000 mg | ORAL_TABLET | Freq: Every day | ORAL | Status: DC
Start: 2017-05-17 — End: 2017-05-19
  Administered 2017-05-17 – 2017-05-19 (×3): 10 mg via ORAL
  Filled 2017-05-17 (×3): qty 1

## 2017-05-17 MED ORDER — LORAZEPAM 0.5 MG PO TABS: 0.5000 mg | ORAL_TABLET | Freq: Four times a day (QID) | ORAL | Status: DC | PRN

## 2017-05-17 MED ORDER — ACETAMINOPHEN 650 MG RE SUPP: 650.0000 mg | Freq: Four times a day (QID) | RECTAL | Status: DC | PRN

## 2017-05-17 MED ORDER — INSULIN ASPART 100 UNIT/ML ~~LOC~~ SOLN
0.0000 [IU] | Freq: Three times a day (TID) | SUBCUTANEOUS | Status: DC
  Administered 2017-05-17: 2 [IU] via SUBCUTANEOUS
  Administered 2017-05-17: 1 [IU] via SUBCUTANEOUS
  Administered 2017-05-17: 2 [IU] via SUBCUTANEOUS
  Administered 2017-05-18: 1 [IU] via SUBCUTANEOUS
  Administered 2017-05-18 (×2): 2 [IU] via SUBCUTANEOUS
  Administered 2017-05-19: 5 [IU] via SUBCUTANEOUS
  Administered 2017-05-19: 3 [IU] via SUBCUTANEOUS

## 2017-05-17 MED ORDER — ONDANSETRON HCL 4 MG PO TABS: 4.0000 mg | ORAL_TABLET | Freq: Four times a day (QID) | ORAL | Status: DC | PRN

## 2017-05-17 MED ORDER — SODIUM CHLORIDE 0.9 % IV SOLN
INTRAVENOUS | Status: DC
  Administered 2017-05-17 (×2): via INTRAVENOUS
  Administered 2017-05-17: 125 mL via INTRAVENOUS
  Administered 2017-05-19 (×2): via INTRAVENOUS

## 2017-05-17 MED ORDER — GABAPENTIN 300 MG PO CAPS
300.0000 mg | ORAL_CAPSULE | Freq: Two times a day (BID) | ORAL | Status: DC
  Administered 2017-05-17 – 2017-05-19 (×5): 300 mg via ORAL
  Filled 2017-05-17 (×5): qty 1

## 2017-05-17 MED ORDER — OXYCODONE HCL 5 MG PO TABS
10.0000 mg | ORAL_TABLET | Freq: Once | ORAL | Status: AC
  Administered 2017-05-17: 10 mg via ORAL
  Filled 2017-05-17: qty 2

## 2017-05-17 MED ORDER — HYDROCODONE-ACETAMINOPHEN 5-325 MG PO TABS
0.5000 | ORAL_TABLET | Freq: Four times a day (QID) | ORAL | Status: DC | PRN
  Administered 2017-05-17: 0.5 via ORAL
  Filled 2017-05-17: qty 1

## 2017-05-17 MED ORDER — LIDOCAINE VISCOUS 2 % MT SOLN: 5.0000 mL | OROMUCOSAL | Status: DC | PRN

## 2017-05-17 MED ORDER — HYDROCODONE-ACETAMINOPHEN 5-325 MG PO TABS: 1.0000 | ORAL_TABLET | ORAL | Status: DC | PRN

## 2017-05-17 MED ORDER — CITALOPRAM HYDROBROMIDE 20 MG PO TABS
20.0000 mg | ORAL_TABLET | ORAL | Status: DC
  Administered 2017-05-17 – 2017-05-19 (×2): 20 mg via ORAL
  Filled 2017-05-17 (×3): qty 1

## 2017-05-17 MED ORDER — PANTOPRAZOLE SODIUM 40 MG IV SOLR
INTRAVENOUS | Status: AC
  Filled 2017-05-17: qty 40

## 2017-05-17 MED ORDER — GI COCKTAIL ~~LOC~~
ORAL | Status: AC
  Filled 2017-05-17: qty 30

## 2017-05-17 MED ORDER — ATORVASTATIN CALCIUM 10 MG PO TABS
10.0000 mg | ORAL_TABLET | ORAL | Status: DC
  Administered 2017-05-17 – 2017-05-19 (×2): 10 mg via ORAL
  Filled 2017-05-17 (×4): qty 1

## 2017-05-17 MED ORDER — PANTOPRAZOLE SODIUM 40 MG IV SOLR
40.0000 mg | Freq: Once | INTRAVENOUS | Status: AC
  Administered 2017-05-17: 40 mg via INTRAVENOUS
  Filled 2017-05-17: qty 40

## 2017-05-17 MED ORDER — ACETAMINOPHEN 325 MG PO TABS: 650.0000 mg | ORAL_TABLET | Freq: Four times a day (QID) | ORAL | Status: DC | PRN

## 2017-05-17 NOTE — H&P (Addendum)
[facility] H&P    Patient Demographics:    Renee Jackson, is a 71 y.o. female  MRN: [id number]  DOB - 09-Jan-1946  Admit Date - 05/16/2017  Referring MD/NP/PA: Margarita Mail  Outpatient Primary MD for the patient is Caryl Bis, MD  Patient coming from: home   Chief Complaint  Patient presents with  . Fatigue      HPI:    Renee Jackson  is a 70 y.o. female, With history of pancreatic adenocarcinoma who recently underwent ultrasound of chemotherapy last week. Patient has been complaining of fatigue, diarrhea for past few days. She also has poor by mouth intake.   patient is a poor historian. She denies chest pain or shortness of breath. No nausea or vomiting.  In the ED lab work revealed sodium 123, creatinine 2.39, hemoglobin 8.2, lactic acid 2.3 Stool for occult blood is positive.  She denies fever, dysuria.   Review of systems:      A full 10 point Review of Systems was done, except as stated above, all other Review of Systems were negative.   With Past History of the following :    Past Medical History:  Diagnosis Date  . Arthritis    hands  . Asthma   . Atrial fibrillation ([place])   . Cancer [facility])    pancreatic  . Chronic lower back pain   . COPD (chronic obstructive pulmonary disease) ([place])   . Depression   . Diabetes ([place])    type 2  . Diabetic neuropathy (HCC)    feet and legs  . Dyspnea   . GERD (gastroesophageal reflux disease)   . Headache    migraines  . Hypercholesteremia   . Hypertension   . Neuropathy    both feet  . Pancreatic mass 03/21/2017  . Pancreatic mass   . Pneumonia 1995 severe case   2 times before that  . Seizures ([place]) as child  to age 84   with high fevers  . Sleep apnea    cpap uses sometimes  . Umbilical hernia       Past Surgical History:  Procedure Laterality Date  . CESAREAN SECTION     x 1  . EUS N/A 03/30/2017   Procedure: UPPER  ENDOSCOPIC ULTRASOUND (EUS) LINEAR;  Surgeon: Milus Banister, MD;  Location: [facility] ENDOSCOPY;  Service: Endoscopy;  Laterality: N/A;  . HERNIA REPAIR     x 2 umbilical   . PORTACATH PLACEMENT N/A 04/21/2017   Procedure: INSERTION PORT-A-CATH;  Surgeon: Aviva Signs, MD;  Location: [facility] ORS;  Service: General;  Laterality: N/A;  . UMBILICAL HERNIA REPAIR     multiple times      Social History:      Social History  Substance Use Topics  . Smoking status: Former Smoker    Packs/day: 1.00    Years: 20.00    Types: Cigarettes    Quit date: 03/21/1994  . Smokeless tobacco: Never Used  . Alcohol use No       Family History :  Family History  Problem Relation Age of Onset  . Heart disease Mother   . Dementia Father       Home Medications:   Prior to Admission medications   Medication Sig Start Date End Date Taking? Authorizing Provider  aspirin EC 81 MG tablet Take 81 mg by mouth daily.   Yes [provider]  atorvastatin (LIPITOR) 10 MG tablet Take 10 mg by mouth every morning.  01/28/17  Yes [provider]  cetirizine (ZYRTEC) 10 MG tablet Take 10 mg by mouth at bedtime.   Yes [provider]  citalopram (CELEXA) 20 MG tablet Take 20 mg by mouth every morning.  01/28/17  Yes [provider]  Diphenhyd-Hydrocort-Nystatin (FIRST-DUKES MOUTHWASH) SUSP Use as directed 5 mLs in the mouth or throat every 4 (four) hours as needed. 05/02/17  Yes Holley Bouche, NP  diphenoxylate-atropine (LOMOTIL) 2.5-0.025 MG tablet Take 1 tablet by mouth 4 (four) times daily as needed for diarrhea or loose stools. 05/16/17  Yes Kefalas, Manon Hilding, PA-C  gabapentin (NEURONTIN) 300 MG capsule Take 300 mg by mouth 2 (two) times daily. 04/27/17  Yes [provider]  Gemcitabine HCl (GEMZAR IV) Inject into the vein. Day 1, 8, 15 every 28 days   Yes [provider]  glipiZIDE (GLUCOTROL XL) 10 MG 24 hr tablet Take 10 mg by mouth daily with breakfast.   02/08/17  Yes [provider]  hydrochlorothiazide (HYDRODIURIL) 25 MG tablet Take 25 mg by mouth at bedtime.  02/14/17  Yes [provider]  HYDROcodone-acetaminophen (NORCO/VICODIN) 5-325 MG tablet Take 0.5 tablets by mouth every 6 (six) hours as needed for moderate pain.  03/18/17  Yes [provider]  lidocaine (XYLOCAINE) 2 % solution Use as directed 5 mLs in the mouth or throat every 4 (four) hours as needed for mouth pain. Mixed with Duke's Magic Mouthwash 05/02/17  Yes [provider]  lidocaine-prilocaine (EMLA) cream Apply to affected area once 04/13/17  Yes Twana First, MD  losartan (COZAAR) 100 MG tablet Take 100 mg by mouth every morning.  01/28/17  Yes [provider]  metFORMIN (GLUCOPHAGE) 1000 MG tablet Take 1,000 mg by mouth 2 (two) times daily with a meal.  02/27/17  Yes [provider]  metoprolol succinate (TOPROL-XL) 50 MG 24 hr tablet Take 50 mg by mouth every morning.  01/28/17  Yes [provider]  omeprazole (PRILOSEC) 20 MG capsule Take 40 mg by mouth every morning.  02/08/17  Yes [provider]  ondansetron (ZOFRAN) 8 MG tablet Take 1 tablet (8 mg total) by mouth 2 (two) times daily as needed (Nausea or vomiting). 04/13/17  Yes Twana First, MD  PACLitaxel Protein-Bound Part (ABRAXANE IV) Inject into the vein. Day 1, 8, 15 every 28 days   Yes [provider]  promethazine (PHENERGAN) 25 MG tablet Take 25 mg by mouth every 6 (six) hours as needed for nausea or vomiting.  03/18/17  Yes [provider]  Simethicone (ALKA-SELTZER ANTI-GAS PO) Take 1 tablet by mouth daily as needed (gas).   Yes [provider]  LORazepam (ATIVAN) 0.5 MG tablet Take 1 tablet (0.5 mg total) by mouth every 6 (six) hours as needed (Nausea or vomiting). 04/13/17   Twana First, MD  montelukast (SINGULAIR) 10 MG tablet Take 10 mg by mouth every morning.  02/08/17   [provider]  Multiple Vitamins-Minerals  (MULTIVITAMIN GUMMIES ADULT) CHEW Chew 2 tablets by mouth daily.    [provider]  Allergies:     Allergies  Allergen Reactions  . Sulfa Antibiotics Itching     Physical Exam:   Vitals  Blood pressure 100/69, pulse 84, temperature 98 F (36.7 C), temperature source Rectal, resp. rate 20, weight 94.3 kg (208 lb), SpO2 100 %.  1.  General: Appears in no acute distress  2. Psychiatric:  Intact judgement and  insight, awake alert, oriented x 3.  3. Neurologic: No focal neurological deficits, all cranial nerves intact.Strength 5/5 all 4 extremities, sensation intact all 4 extremities, plantars down going.  4. Eyes :  anicteric sclerae, moist conjunctivae with no lid lag. PERRLA.  5. ENMT:  Oropharynx clear with moist mucous membranes and good dentition  6. Neck:  supple, no cervical lymphadenopathy appriciated, No thyromegaly  7. Respiratory : Normal respiratory effort, good air movement bilaterally,clear to  auscultation bilaterally  8. Cardiovascular : RRR, no gallops, rubs or murmurs, no leg edema  9. Gastrointestinal:  Positive bowel sounds, abdomen soft, non-tender to palpation,no hepatosplenomegaly, no rigidity or guarding       10. Skin:  No cyanosis, normal texture and turgor, no rash, lesions or ulcers  11.Musculoskeletal:  Good muscle tone,  joints appear normal , no effusions,  normal range of motion    Data Review:    CBC  Recent Labs Lab 05/10/17 1147 05/16/17 2326  WBC 4.5 7.5  HGB 9.6* 8.2*  HCT 28.7* 24.1*  PLT 130* 149*  MCV 84.9 83.4  MCH 28.4 28.4  MCHC 33.4 34.0  RDW 13.2 14.2  LYMPHSABS 1.5  --   MONOABS 0.3  --   EOSABS 0.1  --   BASOSABS 0.0  --    ------------------------------------------------------------------------------------------------------------------  Chemistries   Recent Labs Lab 05/10/17 1147 05/16/17 2326  NA 128* 123*  K 4.1 4.6  CL 91* 90*  CO2 25 22  GLUCOSE 324* 216*  BUN 14 33*    CREATININE 1.02* 3.39*  CALCIUM 8.9 7.6*  AST 56* 22  ALT 54 27  ALKPHOS 70 73  BILITOT 0.5 0.4   ------------------------------------------------------------------------------------------------------------------  ------------------------------------------------------------------------------------------------------------------ GFR: Estimated Creatinine Clearance: 16.8 mL/min (A) (by C-G formula based on SCr of 3.39 mg/dL (H)). Liver Function Tests:  Recent Labs Lab 05/10/17 1147 05/16/17 2326  AST 56* 22  ALT 54 27  ALKPHOS 70 73  BILITOT 0.5 0.4  PROT 6.2* 5.4*  ALBUMIN 3.4* 2.5*    Recent Labs Lab 05/16/17 2326  LIPASE 12    Recent Labs Lab 05/16/17 2334  AMMONIA 13   Coagulation Profile: No results for input(s): INR, PROTIME in the last 168 hours. Cardiac Enzymes: No results for input(s): CKTOTAL, CKMB, CKMBINDEX, TROPONINI in the last 168 hours. BNP (last 3 results) No results for input(s): PROBNP in the last 8760 hours. HbA1C: No results for input(s): HGBA1C in the last 72 hours. CBG:  Recent Labs Lab 05/16/17 2229  GLUCAP 215*   Lipid Profile: No results for input(s): CHOL, HDL, LDLCALC, TRIG, CHOLHDL, LDLDIRECT in the last 72 hours. Thyroid Function Tests: No results for input(s): TSH, T4TOTAL, FREET4, T3FREE, THYROIDAB in the last 72 hours. Anemia Panel: No results for input(s): VITAMINB12, FOLATE, FERRITIN, TIBC, IRON, RETICCTPCT in the last 72 hours.  --------------------------------------------------------------------------------------------------------------- Urine analysis: No results found for: COLORURINE, APPEARANCEUR, LABSPEC, PHURINE, GLUCOSEU, HGBUR, BILIRUBINUR, KETONESUR, PROTEINUR, UROBILINOGEN, NITRITE, LEUKOCYTESUR    Imaging Results:    No results found.  My personal review of EKG: Rhythm NSR   Assessment & Plan:    Active Problems:  Malignant neoplasm of pancreas ([place])   Type 2 diabetes mellitus ([place])    Hypertensive disorder   AKI (acute kidney injury) ([place])   1. Acute kidney injury- patient presenting with diarrhea, poor by mouth intake lab work revealed acute kidney injury, with patient's BUN/creatinine of 33/3.39. Her baseline creatinine 1.02 on 04/30/2017. We'll start normal saline at 125 per hour. 2. Diarrhea- likely from chemotherapy, stool for C. difficile PCR has been ordered. If negative consider symptomatic treatment with Imodium. 3. Hyponatremia-acute on chronic, patient's sodium has been low, today sodium 123 likely from diarrhea. Previous sodium on 05/10/2017 was 128. Check serum osmolarity. Started on IV fluids with normal saline. Follow BMP in a.m. 4. Diabetes mellitus- hold metformin, glipizide. Start sliding scale insulin with NovoLog. 5. Pancreatic adenocarcinoma-patient on chemotherapy, no  metastasis on PET scan as per note from oncology on 05/03/2017.  6. Anemia-likely anemia of chronic disease. FOBT is positive in the ED. Likely from ongoing diarrhea. Hemoglobin is 8.2. Follow CBC in a.m. May need GI evaluation if hemoglobin continues to drop.   DVT Prophylaxis-   SCDs   AM Labs Ordered, also please review Full Orders  Family Communication: Admission, patients condition and plan of care including tests being ordered have been discussed with the patient  who indicate understanding and agree with the plan and Code Status.  Code Status:  DO NOT RESUSCITATE  Admission status: Observation    Time spent in minutes : 50 minutes   Demetries Coia S M.D on 05/17/2017 at 3:14 AM  Between 7am to 7pm - Pager - [phone]. After 7pm go to [email or web address] - password [email or web address]  [facility] - Office  [phone]

## 2017-05-17 NOTE — ED Notes (Signed)
Date and time results received: 05/17/17 0052 (use smartphrase ".now" to insert current time)  Test: lactic Critical Value: 2.3  Name of Provider Notified: Margarita Mail  Orders Received? Or Actions Taken?: n/a

## 2017-05-17 NOTE — Progress Notes (Signed)
PROGRESS NOTE  Renee Jackson YKD:[id number] DOB: 1946/10/01 DOA: 05/16/2017 PCP: Caryl Bis, MD  Brief Narrative: 71 year old woman with pancreatic cancer currently undergoing chemotherapy, PMH atrial fibrillation, COPD, diabetes mellitus; presented with confusion, lethargy, diarrhea. Admitted for acute kidney injury, diarrhea, hyponatremia.  Assessment/Plan #1: Acute kidney injury, likely secondary to diarrhea (chemotherapy-induced), complicated by failure to thrive, poor oral intake. -Modest improvement in BUN/creatinine, though still far above baseline. -Does appear to be making some clinical improvement. Continue IV fluids, monitor volume status. Potassium normal.  #2: Diarrhea, likely secondary to chemotherapy, associated nausea. No recent antibiotics. Doubt C. difficile. However immunocompromised.  -Supportive care, rule out C. difficile.  #3: Hypovolemic Hyponatremia, secondary to poor oral intake, diarrhea. -Mildly symptomatic, mildly confused  -No significant improvement thus far. Continue IV saline, follow serial BMP to ensure slow correction   #4: Pancreatic adenocarcinoma, stage IB on chemotherapy. No metastatic disease on PET scan per oncology notes 05/03/2017.   #5: Diabetes mellitus type 2 with peripheral neuropathy. Hold metformin and glipizide. -Stable    #6: Anemia of malignancy, chronic disease. Fecal occult blood test positive, likely related to diarrhea. Follow CBC. -Hemoglobin stable. No evidence of bleeding.   PMH: Atrial fibrillation, chronic low back pain, COPD, hyperlipidemia, hypertension  Severity of Illness: The appropriate patient status for this patient is INPATIENT. Inpatient status is judged to be reasonable and necessary in order to provide the required intensity of service to ensure the patient's safety. The patient's  symptoms, laboratory data in the context of their chronic comorbidities is felt to place them at high risk for further clinical  deterioration. Furthermore, it is not anticipated that the patient will be medically stable for discharge from the hospital within 2 midnights of admission. The following factors support the patient status of inpatient.   " The patient's presenting symptoms include confusion, lethargy, multiple falls at home, diarrhea, anorexia. " The worrisome physical exam findings include mild confusion. " The initial radiographic and laboratory data are worrisome because of hyponatremia, acute kidney injury. " The chronic co-morbidities include pancreatic adenocarcinoma, diabetes mellitus.   * I certify that at the point of admission it is my clinical judgment that the patient will require inpatient hospital care spanning beyond 2 midnights from the point of admission due to high intensity of service, high risk for further deterioration and high frequency of surveillance required.*   DVT prophylaxis: SCDs Code Status: DNR Family Communication: daughter at bedside Disposition Plan: home    Murray Hodgkins, MD  [facility] Direct contact: [phone] --Via [place]  --[email or web address]; password [email or web address]  7PM-7AM contact night coverage as above 05/17/2017, 10:25 AM  LOS: 0 days   Consultants:    Procedures:    Antimicrobials:    Interval history/Subjective: Feels a little better. Had an episode of diarrhea this morning. Nausea but no vomiting. No appetite. Breathing okay. No pain.  Objective: Vitals: Afebrile, temperature 98.1, respirations 19, blood pressure 97/77, heart rate 108  Exam:     Constitutional: Appears chronically ill but nontoxic. Lying in bed.  Eyes: Pupils, irises, lids appear normal  ENT: Lips and tongue appear unremarkable. Hearing grossly normal.  Cardiovascular: Regular rate and rhythm. No murmur rub or gallop. No lower extremity edema. Telemetry sinus rhythm.  Respiratory: Clear to auscultation bilaterally. No wheezes, rales or rhonchi. Normal respiratory  effort.  Abdomen: Soft, nontender, nondistended.  Musculoskeletal: Grossly normal tone and strength all extremities. Moves all extremities. Digits and nails of the hands appear normal.  Psychiatric:  Grossly normal mood and affect. Oriented to person, month, year, although answers are slow and at times strong.   I have personally reviewed the following:   Labs:  Complete blood sugars stable  Sodium 123 >>121  Potassium normal  Chloride 90, no change  BUN modestly improved, 31  Creatinine 3.39>> 3.17  Lactic take acid modestly elevated consistent with dehydration  Hemoglobin stable at 8.2>> 8.0. Normal WBC. Platelets 152.  Imaging studies:    Medical tests:     Test discussed with performing physician:    Decision to obtain old records:    Review and summation of old records:  Seen in the oncology office 05/03/2017, started on neoadjuvant chemotherapy, previously seen at [facility] for consideration of surgery, to be reconsidered after chemotherapy.  Scheduled Meds: . aspirin EC  81 mg Oral Daily  . atorvastatin  10 mg Oral BH-q7a  . citalopram  20 mg Oral BH-q7a  . gabapentin  300 mg Oral BID  . insulin aspart  0-9 Units Subcutaneous TID WC  . loratadine  10 mg Oral Daily  . pantoprazole  40 mg Oral Daily   Continuous Infusions: . sodium chloride 125 mL (05/17/17 0514)    Principal Problem:   AKI (acute kidney injury) ([place]) Active Problems:   Malignant neoplasm of pancreas ([place])   Type 2 diabetes mellitus ([place])   Hyponatremia   Diarrhea   LOS: 0 days

## 2017-05-17 NOTE — ED Notes (Signed)
Report to [facility], ICU

## 2017-05-17 NOTE — ED Notes (Signed)
Reminded pt of need for urine sample. Pt stated that she still cannot provide sample at this time due to dehydration

## 2017-05-17 NOTE — Progress Notes (Signed)
Upon going into room to check lead placement, noted that gown was wet at the top.  Assessed port and patient had de-accessed it;  Patient had gotten hot and was attempting to take bra off.  Appears to have dislodged Port needle.  No damage done to surrounding skin.  Tape was removed from dressing and gauze placed over site until port access kit obtained. Port the re-accessed using sterile technique, bio patch in place.  Port flushes well and is comfortable to patient, although does not give any blood return.  The previous access was noted to have no blood return as well per report from previous shift RN. Dressing dry and intact at this time.

## 2017-05-18 ENCOUNTER — Ambulatory Visit (HOSPITAL_COMMUNITY): Payer: Medicare Other

## 2017-05-18 LAB — BASIC METABOLIC PANEL
ANION GAP: 8 (ref 5–15)
Anion gap: 7 (ref 5–15)
BUN: 23 mg/dL — ABNORMAL HIGH (ref 6–20)
BUN: 20 mg/dL (ref 6–20)
CHLORIDE: 97 mmol/L — AB (ref 101–111)
CO2: 22 mmol/L (ref 22–32)
CO2: 23 mmol/L (ref 22–32)
CREATININE: 1.35 mg/dL — AB (ref 0.44–1.00)
Calcium: 7.1 mg/dL — ABNORMAL LOW (ref 8.9–10.3)
Calcium: 7.2 mg/dL — ABNORMAL LOW (ref 8.9–10.3)
Chloride: 95 mmol/L — ABNORMAL LOW (ref 101–111)
Creatinine, Ser: 1.6 mg/dL — ABNORMAL HIGH (ref 0.44–1.00)
GFR calc non Af Amer: 39 mL/min — ABNORMAL LOW (ref >60)
GFR, EST AFRICAN AMERICAN: 37 mL/min — AB (ref >60)
GFR, EST AFRICAN AMERICAN: 45 mL/min — AB (ref >60)
GFR, EST NON AFRICAN AMERICAN: 32 mL/min — AB (ref >60)
GLUCOSE: 139 mg/dL — AB (ref 65–99)
Glucose, Bld: 140 mg/dL — ABNORMAL HIGH (ref 65–99)
POTASSIUM: 3.7 mmol/L (ref 3.5–5.1)
Potassium: 4.3 mmol/L (ref 3.5–5.1)
SODIUM: 125 mmol/L — AB (ref 135–145)
SODIUM: 127 mmol/L — AB (ref 135–145)

## 2017-05-18 LAB — GLUCOSE, CAPILLARY
GLUCOSE-CAPILLARY: 177 mg/dL — AB (ref 65–99)
GLUCOSE-CAPILLARY: 146 mg/dL — AB (ref 65–99)
Glucose-Capillary: 188 mg/dL — ABNORMAL HIGH (ref 65–99)
Glucose-Capillary: 163 mg/dL — ABNORMAL HIGH (ref 65–99)

## 2017-05-18 LAB — HEMOGLOBIN A1C
HEMOGLOBIN A1C: 11.2 % — AB (ref 4.8–5.6)
MEAN PLASMA GLUCOSE: 275 mg/dL

## 2017-05-18 MED ORDER — SIMETHICONE 80 MG PO CHEW
80.0000 mg | CHEWABLE_TABLET | Freq: Once | ORAL | Status: AC
  Administered 2017-05-18: 80 mg via ORAL
  Filled 2017-05-18: qty 1

## 2017-05-18 NOTE — Progress Notes (Signed)
Pt tranferring to room 333 on telemetry.hr 85 in nsr. Pt voiding clear yellow urine. No n/v/diarrhea today.. Transfer report called to Christian rn on 300.

## 2017-05-18 NOTE — Progress Notes (Signed)
PROGRESS NOTE  Renee Jackson UUV:[id number] DOB: 05-31-1946 DOA: 05/16/2017 PCP: Caryl Bis, MD  Brief Narrative: 71 year old woman with pancreatic cancer currently undergoing chemotherapy, PMH atrial fibrillation, COPD, diabetes mellitus; presented with confusion, lethargy, diarrhea. Admitted for acute kidney injury, diarrhea, hyponatremia.  Assessment/Plan #1: Acute kidney injury, likely secondary to diarrhea (chemotherapy-induced), complicated by failure to thrive, poor oral intake. -Continues to improve with IV fluids. -Continue IV fluids  #2: Diarrhea, likely secondary to chemotherapy, associated nausea now resolved.  -Supportive care. C. difficile has been ruled out.  #3: Hypovolemic hyponatremia, secondary to poor oral intake, diarrhea. -Now appears to be asymptomatic. Much improved from that standpoint. -Sodium slowly trending up, still far below goal -No nausea. Confusion seems to have resolved. 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 No metastatic disease on PET scan per oncology notes 05/03/2017.   #5: Diabetes mellitus type 2 with peripheral neuropathy. Hold metformin and glipizide. -Remains stable. Continue sliding scale insulin.  #6: Anemia of malignancy, chronic disease. Fecal occult blood test positive, likely related to diarrhea. Follow CBC. -No further evaluation planned. Hemoglobin was stable yesterday.  PMH: Atrial fibrillation, chronic low back pain, COPD, hyperlipidemia, hypertension   Advance diet, discontinue telemetry, likely home next 48 hours of sodium continues to improve  DVT prophylaxis: SCDs Code Status: DNR Family Communication:  Disposition Plan: home    Murray Hodgkins, MD  [facility] Direct contact: [phone] --Via amion app [place]  --[email or web address]; password [email or web address]  7PM-7AM contact night coverage as above 05/18/2017, 10:36 AM  LOS: 1 day    Consultants:    Procedures:    Antimicrobials:    Interval history/Subjective: No issues overnight. Feeling better.  Objective: Vitals: Temperature 97.8, respirations 15, pulse 80, blood pressure 135/73  Exam:     Constitutional: Appears calm, comfortable, much better today  Eyes: Pupils, irises, lids appear normal  ENT: Lips and tongue appear normal. Hearing grossly normal.  Cardiovascular: Regular rate and rhythm. No murmur, rub or gallop. No lower extremity edema.  Respiratory: Clear to auscultation bilaterally. No wheezes, rales or rhonchi. Normal respiratory effort.  Abdomen soft, nontender, nondistended  Musculoskeletal, moves all extremities well, grossly normal tone and strength  Psychiatric: Grossly normal mood and affect, oriented to self, location, month, year   I have personally reviewed the following:   Labs:  Sodium up to 125  Blood sugars stable  Creatinine continues to trend downwards, 1.60  Imaging studies:    Medical tests:     Test discussed with performing physician:    Decision to obtain old records:    Review and summation of old records:  Seen in the oncology office 05/03/2017, started on neoadjuvant chemotherapy, previously seen at [facility] for consideration of surgery, to be reconsidered after chemotherapy.  Scheduled Meds: . aspirin EC  81 mg Oral Daily  . atorvastatin  10 mg Oral BH-q7a  . citalopram  20 mg Oral BH-q7a  . gabapentin  300 mg Oral BID  . insulin aspart  0-9 Units Subcutaneous TID WC  . loratadine  10 mg Oral Daily  . pantoprazole  40 mg Oral Daily   Continuous Infusions: . sodium chloride 125 mL/hr at 05/18/17 0900    Principal Problem:   AKI (acute kidney injury) ([place]) Active Problems:   Malignant neoplasm of pancreas ([place])   Type 2 diabetes mellitus ([place])   Hyponatremia   Diarrhea   LOS: 1 day

## 2017-05-18 NOTE — Progress Notes (Signed)
Inpatient Diabetes Program Recommendations  AACE/ADA: New Consensus Statement on Inpatient Glycemic Control (2015)  Target Ranges:  Prepandial:   less than 140 mg/dL      Peak postprandial:   less than 180 mg/dL (1-2 hours)      Critically ill patients:  140 - 180 mg/dL  Results for Renee Jackson, Renee Jackson (MRN [id number]) as of 05/18/2017 10:46  Ref. Range 05/17/2017 07:59 05/17/2017 11:40 05/17/2017 16:33 05/17/2017 21:05 05/18/2017 07:49  Glucose-Capillary Latest Ref Range: 65 - 99 mg/dL 128 (H) 157 (H) 151 (H) 130 (H) 177 (H)   Results for Renee Jackson, Renee Jackson (MRN [id number]) as of 05/18/2017 10:46  Ref. Range 05/17/2017 04:53  Hemoglobin Latest Ref Range: 12.0 - 15.0 g/dL 8.0 (L)  Results for Renee Jackson, Renee Jackson (MRN [id number]) as of 05/18/2017 10:46  Ref. Range 05/17/2017 04:53  Hemoglobin A1C Latest Ref Range: 4.8 - 5.6 % 11.2 (H)   Review of Glycemic Control  Diabetes history: DM2 Outpatient Diabetes medications: Glipizide XL 10 mg QAM, Metformin 1000 mg BID Current orders for Inpatient glycemic control: Novolog 0-9 units TID with meals  Inpatient Diabetes Program Recommendations: HgbA1C: A1C 11.2% on 05/17/17 which would indicate an average glucose of 275 mg/dl. However, hemoglobin noted to be 8 g/dL on 05/17/17 so A1C is likely not accurate and patient has pancreatic cancer. Over the past 24 hours glucose has ranged from 128-177 mg/dl and patient has only received a total of Novolog 7 units.  MD may want to consider adjusting outpatient DM medications at time of discharge and have patient follow up with PCP.  Thanks, Barnie Alderman, RN, MSN, CDE Diabetes Coordinator Inpatient Diabetes Program [phone] (Team Pager from 8am to 5pm)

## 2017-05-19 LAB — BASIC METABOLIC PANEL
ANION GAP: 9 (ref 5–15)
BUN: 15 mg/dL (ref 6–20)
CO2: 21 mmol/L — ABNORMAL LOW (ref 22–32)
Calcium: 7 mg/dL — ABNORMAL LOW (ref 8.9–10.3)
Chloride: 103 mmol/L (ref 101–111)
Creatinine, Ser: 1.06 mg/dL — ABNORMAL HIGH (ref 0.44–1.00)
GFR calc Af Amer: >60 mL/min (ref >60)
GFR, EST NON AFRICAN AMERICAN: 52 mL/min — AB (ref >60)
GLUCOSE: 246 mg/dL — AB (ref 65–99)
POTASSIUM: 4.4 mmol/L (ref 3.5–5.1)
Sodium: 133 mmol/L — ABNORMAL LOW (ref 135–145)

## 2017-05-19 LAB — GLUCOSE, CAPILLARY
GLUCOSE-CAPILLARY: 204 mg/dL — AB (ref 65–99)
Glucose-Capillary: 277 mg/dL — ABNORMAL HIGH (ref 65–99)

## 2017-05-19 MED ORDER — DIPHENOXYLATE-ATROPINE 2.5-0.025 MG PO TABS
1.0000 | ORAL_TABLET | Freq: Four times a day (QID) | ORAL | Status: DC
  Administered 2017-05-19: 1 via ORAL
  Filled 2017-05-19: qty 1

## 2017-05-19 MED ORDER — HEPARIN SOD (PORK) LOCK FLUSH 100 UNIT/ML IV SOLN
500.0000 [IU] | INTRAVENOUS | Status: AC | PRN
  Administered 2017-05-19: 500 [IU]
  Filled 2017-05-19: qty 5

## 2017-05-19 MED ORDER — DIPHENOXYLATE-ATROPINE 2.5-0.025 MG PO TABS: 1.0000 | ORAL_TABLET | Freq: Four times a day (QID) | ORAL | Status: DC | PRN

## 2017-05-19 NOTE — Discharge Summary (Signed)
Physician Discharge Summary  Renee Jackson KPT:[id number] DOB: 06-May-1946 DOA: 05/16/2017  PCP: Caryl Bis, MD  Admit date: 05/16/2017 Discharge date: 05/19/2017   Follow-up Information    Caryl Bis, MD. Schedule an appointment as soon as possible for a visit in 1 week(s).   Specialty:  Family Medicine Contact information: [place] [address] [phone]           Discharge Diagnoses:  1. Acute kidney injury 2. Chemotherapy-induced diarrhea 3. Hypovolemic hyponatremia 4. Pancreatic adenocarcinoma 5. Diabetes mellitus type 2 with peripheral neuropathy 6. Anemia of malignancy, chronic disease  Discharge Condition: Improved Disposition: Home  Diet recommendation: Regular  Filed Weights   05/16/17 2145 05/17/17 0600 05/18/17 0500  Weight: 94.3 kg (208 lb) 94.1 kg (207 lb 7.3 oz) 102.4 kg (225 lb 12 oz)    History of present illness:  71 year old woman with pancreatic cancer currently undergoing chemotherapy, PMH atrial fibrillation, COPD, diabetes mellitus; presented with confusion, lethargy, diarrhea. Admitted for acute kidney injury, diarrhea, hyponatremia.  Hospital Course:  Patient was treated with IV fluids with gradual resolution of acute kidney injury and hyponatremia. Diarrhea stabilized and was treated with antimotility agent. Hospitalization was uncomplicated, individual issues as below.  #1 acute kidney injury secondary to chemotherapy-induced diarrhea complicated by poor oral intake. Resolved with IV fluids.  #2 diarrhea secondary to chemotherapy, stable. Continue antimotility agent. C. difficile ruled out.  #3: Hypovolemic hyponatremia, improved with IV fluids, expect spontaneous resolution. Associated acute encephalopathy resolved.  #4: Pancreatic adenocarcinoma  #5: Diabetes mellitus type 2 with peripheral neuropathy, remained stable. Resume oral agents on discharge.  Today's assessment: S: Overall feeling fine O: Vitals:  Afebrile, temperature 97.8, respirations 20, pulse 87, blood pressure 162/44   Constitutional. Appears calm, comfortable.  Cardiovascular regular rate and rhythm. No murmur, rub or gallop.  Respiratory. Clear to auscultation bilaterally. No wheezes, rales or rhonchi. Normal respiratory effort.  Discharge Instructions  Discharge Instructions    Diet general    Complete by:  As directed    Discharge instructions    Complete by:  As directed    Call your physician or seek immediate medical attention for confusion, falls, pain, worsening diarrhea or worsening of condition.   Increase activity slowly    Complete by:  As directed      Allergies as of 05/19/2017      Reactions   Sulfa Antibiotics Itching      Medication List    TAKE these medications   ABRAXANE IV Inject into the vein. Day 1, 8, 15 every 28 days   ALKA-SELTZER ANTI-GAS PO Take 1 tablet by mouth daily as needed (gas).   aspirin EC 81 MG tablet Take 81 mg by mouth daily.   atorvastatin 10 MG tablet Commonly known as:  LIPITOR Take 10 mg by mouth every morning.   cetirizine 10 MG tablet Commonly known as:  ZYRTEC Take 10 mg by mouth at bedtime.   citalopram 20 MG tablet Commonly known as:  CELEXA Take 20 mg by mouth every morning.   diphenoxylate-atropine 2.5-0.025 MG tablet Commonly known as:  LOMOTIL Take 1 tablet by mouth 4 (four) times daily as needed for diarrhea or loose stools.   FIRST-DUKES MOUTHWASH Susp Use as directed 5 mLs in the mouth or throat every 4 (four) hours as needed.   gabapentin 300 MG capsule Commonly known as:  NEURONTIN Take 300 mg by mouth 2 (two) times daily.   GEMZAR IV Inject into the  vein. Day 1, 8, 15 every 28 days   glipiZIDE 10 MG 24 hr tablet Commonly known as:  GLUCOTROL XL Take 10 mg by mouth daily with breakfast.   hydrochlorothiazide 25 MG tablet Commonly known as:  HYDRODIURIL Take 25 mg by mouth at bedtime.   HYDROcodone-acetaminophen 5-325 MG  tablet Commonly known as:  NORCO/VICODIN Take 0.5 tablets by mouth every 6 (six) hours as needed for moderate pain.   lidocaine 2 % solution Commonly known as:  XYLOCAINE Use as directed 5 mLs in the mouth or throat every 4 (four) hours as needed for mouth pain. Mixed with Duke's Magic Mouthwash   lidocaine-prilocaine cream Commonly known as:  EMLA Apply to affected area once   LORazepam 0.5 MG tablet Commonly known as:  ATIVAN Take 1 tablet (0.5 mg total) by mouth every 6 (six) hours as needed (Nausea or vomiting).   losartan 100 MG tablet Commonly known as:  COZAAR Take 100 mg by mouth every morning.   metFORMIN 1000 MG tablet Commonly known as:  GLUCOPHAGE Take 1,000 mg by mouth 2 (two) times daily with a meal.   metoprolol succinate 50 MG 24 hr tablet Commonly known as:  TOPROL-XL Take 50 mg by mouth every morning.   montelukast 10 MG tablet Commonly known as:  SINGULAIR Take 10 mg by mouth every morning.   MULTIVITAMIN GUMMIES ADULT Chew Chew 2 tablets by mouth daily.   omeprazole 20 MG capsule Commonly known as:  PRILOSEC Take 40 mg by mouth every morning.   ondansetron 8 MG tablet Commonly known as:  ZOFRAN Take 1 tablet (8 mg total) by mouth 2 (two) times daily as needed (Nausea or vomiting).   promethazine 25 MG tablet Commonly known as:  PHENERGAN Take 25 mg by mouth every 6 (six) hours as needed for nausea or vomiting.      Allergies  Allergen Reactions  . Sulfa Antibiotics Itching    The results of significant diagnostics from this hospitalization (including imaging, microbiology, ancillary and laboratory) are listed below for reference.    Significant Diagnostic Studies: Dg Chest Port 1 View  Result Date: 04/21/2017 CLINICAL DATA:  Postop Port-A-Cath placement EXAM: PORTABLE CHEST 1 VIEW COMPARISON:  None. FINDINGS: Left Port-A-Cath has been placed with the tip in the upper SVC at the confluence of the innominate veins. No pneumothorax. Heart is  normal size. No confluent airspace opacities or effusions. IMPRESSION: Left Port-A-Cath tip in the upper SVC at the confluence of the innominate veins. No pneumothorax. Electronically Signed   By: Rolm Baptise M.D.   On: 04/21/2017 12:36   Dg C-arm 1-60 Min-no Report  Result Date: 04/21/2017 Fluoroscopy was utilized by the requesting physician.  No radiographic interpretation.    Microbiology: Recent Results (from the past 240 hour(s))  C difficile quick scan w PCR reflex     Status: None   Collection Time: 05/17/17  1:04 AM  Result Value Ref Range Status   C Diff antigen NEGATIVE NEGATIVE Final   C Diff toxin NEGATIVE NEGATIVE Final   C Diff interpretation No C. difficile detected.  Final  MRSA PCR Screening     Status: None   Collection Time: 05/17/17  4:23 AM  Result Value Ref Range Status   MRSA by PCR NEGATIVE NEGATIVE Final    Comment:        The GeneXpert MRSA Assay (FDA approved for NASAL specimens only), is one component of a comprehensive MRSA colonization surveillance program. It is not intended to  diagnose MRSA infection nor to guide or monitor treatment for MRSA infections.      Labs: Basic Metabolic Panel:  Recent Labs Lab 05/17/17 1620 05/17/17 2206 05/18/17 0435 05/18/17 1348 05/19/17 0451  NA 122* 121* 125* 127* 133*  K 4.3 3.9 3.7 4.3 4.4  CL 92* 93* 95* 97* 103  CO2 22 19* 22 23 21*  GLUCOSE 170* 140* 139* 140* 246*  BUN 27* 24* 23* 20 15  CREATININE 2.41* 1.96* 1.60* 1.35* 1.06*  CALCIUM 7.0* 6.8* 7.1* 7.2* 7.0*   Liver Function Tests:  Recent Labs Lab 05/16/17 2326 05/17/17 0453  AST 22 24  ALT 27 24  ALKPHOS 73 75  BILITOT 0.4 0.5  PROT 5.4* 5.1*  ALBUMIN 2.5* 2.4*    Recent Labs Lab 05/16/17 2326  LIPASE 12    Recent Labs Lab 05/16/17 2334  AMMONIA 13   CBC:  Recent Labs Lab 05/16/17 2326 05/17/17 0453  WBC 7.5 6.4  HGB 8.2* 8.0*  HCT 24.1* 23.4*  MCV 83.4 84.2  PLT 149* 152    CBG:  Recent Labs Lab  05/18/17 1201 05/18/17 1629 05/18/17 2035 05/19/17 0754 05/19/17 1113  GLUCAP 188* 146* 163* 277* 204*    Principal Problem:   AKI (acute kidney injury) ([place]) Active Problems:   Malignant neoplasm of pancreas ([place])   Type 2 diabetes mellitus ([place])   Hyponatremia   Diarrhea   Time coordinating discharge: 35 minutes  Signed:  Murray Hodgkins, MD [facility] 05/19/2017, 3:54 PM

## 2017-05-19 NOTE — Care Management Important Message (Signed)
Important Message  Patient Details  Name: Renee Jackson MRN: [id number] Date of Birth: Jan 20, 1946   Medicare Important Message Given:  Yes    Sherald Barge, RN 05/19/2017, 10:46 AM

## 2017-05-19 NOTE — Progress Notes (Signed)
Patient discharged home with all personal belongings. Port is deaccessed and site intact.

## 2017-05-22 ENCOUNTER — Telehealth (HOSPITAL_COMMUNITY): Payer: Self-pay | Admitting: Emergency Medicine

## 2017-05-22 NOTE — Telephone Encounter (Signed)
Renee Jackson called and stated that she has been having bad abdominal pain since Saturday.  Pain medication will not relieve the pain.  She does not want to eat again.  Told to take pt back to ER to be re-evaluated.  She verbalized understanding.

## 2017-05-24 ENCOUNTER — Encounter (HOSPITAL_COMMUNITY): Payer: Medicare Other | Attending: Oncology

## 2017-05-24 ENCOUNTER — Ambulatory Visit (HOSPITAL_COMMUNITY)
Admission: RE | Admit: 2017-05-24 | Discharge: 2017-05-24 | Disposition: A | Discharge status: Home / Self Care | Payer: Medicare Other | Source: Ambulatory Visit | Attending: Adult Health | Admitting: Adult Health

## 2017-05-24 ENCOUNTER — Encounter (HOSPITAL_BASED_OUTPATIENT_CLINIC_OR_DEPARTMENT_OTHER): Payer: Medicare Other | Admitting: Adult Health

## 2017-05-24 ENCOUNTER — Encounter (HOSPITAL_COMMUNITY): Payer: Self-pay | Admitting: Adult Health

## 2017-05-24 ENCOUNTER — Other Ambulatory Visit (HOSPITAL_COMMUNITY): Payer: Self-pay | Admitting: Adult Health

## 2017-05-24 ENCOUNTER — Telehealth (HOSPITAL_COMMUNITY): Payer: Self-pay | Admitting: Adult Health

## 2017-05-24 DIAGNOSIS — R600 Localized edema: Secondary | ICD-10-CM | POA: Insufficient documentation

## 2017-05-24 DIAGNOSIS — C259 Malignant neoplasm of pancreas, unspecified: Secondary | ICD-10-CM | POA: Insufficient documentation

## 2017-05-24 DIAGNOSIS — G893 Neoplasm related pain (acute) (chronic): Secondary | ICD-10-CM

## 2017-05-24 DIAGNOSIS — R197 Diarrhea, unspecified: Secondary | ICD-10-CM | POA: Diagnosis not present

## 2017-05-24 DIAGNOSIS — E860 Dehydration: Secondary | ICD-10-CM | POA: Diagnosis not present

## 2017-05-24 DIAGNOSIS — K869 Disease of pancreas, unspecified: Secondary | ICD-10-CM | POA: Insufficient documentation

## 2017-05-24 DIAGNOSIS — Z95828 Presence of other vascular implants and grafts: Secondary | ICD-10-CM | POA: Diagnosis not present

## 2017-05-24 DIAGNOSIS — K8689 Other specified diseases of pancreas: Secondary | ICD-10-CM

## 2017-05-24 LAB — CBC WITH DIFFERENTIAL/PLATELET
BAND NEUTROPHILS: 8 %
BASOS ABS: 0 10*3/uL (ref 0.0–0.1)
BLASTS: 0 %
Basophils Relative: 0 %
EOS ABS: 0.3 10*3/uL (ref 0.0–0.7)
Eosinophils Relative: 2 %
HCT: 28.1 % — ABNORMAL LOW (ref 36.0–46.0)
HEMOGLOBIN: 9.3 g/dL — AB (ref 12.0–15.0)
LYMPHS PCT: 20 %
Lymphs Abs: 3.4 10*3/uL (ref 0.7–4.0)
MCH: 28.9 pg (ref 26.0–34.0)
MCHC: 33.1 g/dL (ref 30.0–36.0)
MCV: 87.3 fL (ref 78.0–100.0)
MONOS PCT: 6 %
Metamyelocytes Relative: 2 %
Monocytes Absolute: 1 10*3/uL (ref 0.1–1.0)
Myelocytes: 0 %
NEUTROS ABS: 12.1 10*3/uL — AB (ref 1.7–7.7)
Neutrophils Relative %: 62 %
OTHER: 0 %
PLATELETS: 477 10*3/uL — AB (ref 150–400)
PROMYELOCYTES ABS: 0 %
RBC: 3.22 MIL/uL — AB (ref 3.87–5.11)
RDW: 15.6 % — ABNORMAL HIGH (ref 11.5–15.5)
WBC: 16.8 10*3/uL — AB (ref 4.0–10.5)
nRBC: 0 /100 WBC

## 2017-05-24 LAB — COMPREHENSIVE METABOLIC PANEL
ALK PHOS: 64 U/L (ref 38–126)
ALT: 18 U/L (ref 14–54)
AST: 29 U/L (ref 15–41)
Albumin: 2.6 g/dL — ABNORMAL LOW (ref 3.5–5.0)
Anion gap: 8 (ref 5–15)
BUN: 12 mg/dL (ref 6–20)
CALCIUM: 7 mg/dL — AB (ref 8.9–10.3)
CHLORIDE: 103 mmol/L (ref 101–111)
CO2: 26 mmol/L (ref 22–32)
CREATININE: 1.12 mg/dL — AB (ref 0.44–1.00)
GFR calc non Af Amer: 49 mL/min — ABNORMAL LOW (ref >60)
GFR, EST AFRICAN AMERICAN: 57 mL/min — AB (ref >60)
Glucose, Bld: 144 mg/dL — ABNORMAL HIGH (ref 65–99)
Potassium: 4.2 mmol/L (ref 3.5–5.1)
SODIUM: 137 mmol/L (ref 135–145)
Total Bilirubin: 0.7 mg/dL (ref 0.3–1.2)
Total Protein: 4.9 g/dL — ABNORMAL LOW (ref 6.5–8.1)

## 2017-05-24 MED ORDER — HYDROCODONE-ACETAMINOPHEN 5-325 MG PO TABS: 1.0000 | ORAL_TABLET | ORAL | 0 refills | Status: DC | PRN

## 2017-05-24 MED ORDER — IOPAMIDOL (ISOVUE-300) INJECTION 61%
INTRAVENOUS | Status: AC
  Administered 2017-05-24: 10 mL
  Filled 2017-05-24: qty 50

## 2017-05-24 NOTE — Patient Instructions (Signed)
[facility] Discharge Instructions  RECOMMENDATIONS MADE BY THE CONSULTANT AND ANY TEST RESULTS WILL BE SENT TO YOUR REFERRING PHYSICIAN.  You saw Gretchen Dawson, NP, today See Danielle at checkout for appointments.   Thank you for choosing [facility] at [facility] to provide your oncology and hematology care.  To afford each patient quality time with our provider, please arrive at least 15 minutes before your scheduled appointment time.    If you have a lab appointment with the Cancer Center please come in thru the  Main Entrance and check in at the main information desk  You need to re-schedule your appointment should you arrive 10 or more minutes late.  We strive to give you quality time with our providers, and arriving late affects you and other patients whose appointments are after yours.  Also, if you no show three or more times for appointments you may be dismissed from the clinic at the providers discretion.     Again, thank you for choosing [facility].  Our hope is that these requests will decrease the amount of time that you wait before being seen by our physicians.       _____________________________________________________________  Should you have questions after your visit to  Cancer Center, please contact our office at [phone] between the hours of 8:30 a.m. and 4:30 p.m.  Voicemails left after 4:30 p.m. will not be returned until the following business day.  For prescription refill requests, have your pharmacy contact our office.       Resources For Cancer Patients and their Caregivers ? American Cancer Society: Can assist with transportation, wigs, general needs, runs Look Good Feel Better.        1-888-227-6333 ? Cancer Care: Provides financial assistance, online support groups, medication/co-pay assistance.  1-800-813-HOPE (4673) ? [facility] Assists  [place] cancer patients and their families through emotional , educational and financial support.  336-427-4357 ? [place] DSS Where to apply for food stamps, Medicaid and utility assistance. 336-342-1394 ? [facility]: Transportation to medical appointments. 336-347-2287 ? Social Security Administration: May apply for disability if have a Stage IV cancer. 336-342-7796 1-800-772-1213 ? [place] Aging, Disability and Transit Services: Assists with nutrition, care and transit needs. 336-349-2343  Cancer Center Support Programs: @10RELATIVEDAYS@ > Cancer Support Group  2nd Tuesday of the month 1pm-2pm, Journey Room  > Creative Journey  3rd Tuesday of the month 1130am-1pm, Journey Room  > Look Good Feel Better  1st Wednesday of the month 10am-12 noon, Journey Room (Call American Cancer Society to register 1-800-395-5775)    

## 2017-05-24 NOTE — Telephone Encounter (Signed)
Attempted to reach Dr. Arnoldo Morale for consideration for port revision for this patient based on recent dye study results completed today.  Left voicemail and awaiting return call.    Mike Craze, NP [facility] [phone]

## 2017-05-24 NOTE — Progress Notes (Signed)
1235:  To radiology for [place] bilateral lower extremities and for dye study.   1440:  Returns from radiology.   Per NP, tx will be held today due to kinks x 2 in portacath and displacement from subclavian to brachiocephalic vein.  Pt notified per NP - will schedule with Dr. Arnoldo Morale for port revision, or in [place].

## 2017-05-24 NOTE — Progress Notes (Signed)
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      03/27/2017 PET scan    Relatively low-level hypermetabolism corresponding to the pancreatic head mass. This measures on the order of 3.8 cm and a S.U.V. max of 4.4 on image 107/series 4. Re- demonstration of pancreatic body and tail atrophy with duct dilatation.  No other hypermetabolism within abdominopelvic nodes or parenchyma. Abdominopelvic findings deferred to prior diagnostic CT. Cholelithiasis. Abdominal aortic atherosclerosis. Fat containing ventral abdominal wall laxity, status post hernia repair.      03/30/2017 Initial Diagnosis    Pancreatic adenocarcinoma ([place])      03/30/2017 Procedure    EUS with FNA of pancreatic mass performed, path positive for adenocarcinoma.      04/26/2017 -  Neo-Adjuvant Chemotherapy    Gemzar/Abraxane days 1, 8, & 15 every 28 days         INTERVAL HISTORY:  Ms. Renee Jackson 71 y.o. female returns for routine follow-up for pancreatic cancer.   She is here for cycle #2 Gemzar/Abraxane chemotherapy.  Chart reviewed; she was recently hospitalized from 05/16/17-05/19/17 for AKI/dehydration, hyponatremia, and diarrhea.    She is continuing to recover from recent hospitalization. Endorses that she has fallen twice since being discharged d/t dizziness.  Overall, she still feels very weak and fatigued.  She continues to have diarrhea, about 4-5 stools per day. Also endorses  indigestion.  She was prescribed Lomotil for the diarrhea to take PRN; she generally only takes this about once per day.  Her daughter is very concerned about leg swelling, particularly the right ankle, as well as some bruising to patient's right foot.  Denies any fever or chills.  Endorses "constant stomachache", particularly to LUQ. She takes Norco 3-4 times per day, which is helpful; "I usually only take 1/2 pill at a time."  Her mouth sores that she had with cycle #1 of chemo are improved; she uses Magic Mouthwash which is helpful for her.  She has no appetite. She tries to drink Ensure, plenty of water, and is also drinking the G2 Gatorade "for diabetics."  Her weight is stable per her report.   The patient and her daughter are concerned about her port-a-cath. They state that when she was in the ED recently, "The whole port came out of my chest and they pushed it back in."  ED nursing notes reviewed and it appears patient was confused/disoriented in ED and the port was tugged when she was trying to remove her clothes.  Patient reports pain with port being accessed today for chemotherapy.  She tells me that she port is also not giving blood return; she had to have peripheral blood draw for her labs today.      REVIEW OF SYSTEMS:  Review of Systems  Constitutional: Positive for appetite change and fatigue. Negative for chills and fever.  HENT:   Negative for mouth sores ("they're better now" ).   Eyes: Negative.  Respiratory: Negative.   Cardiovascular: Positive for leg swelling.  Gastrointestinal: Positive for abdominal pain and diarrhea. Negative for blood in stool, constipation, nausea and vomiting.  Endocrine: Negative.   Genitourinary: Negative.  Negative for dysuria, hematuria and vaginal bleeding.   Musculoskeletal: Negative.   Skin: Negative.   Neurological: Positive for dizziness. Negative for headaches.  Hematological: Bruises/bleeds easily.  Psychiatric/Behavioral: Negative.       PAST MEDICAL/SURGICAL HISTORY:  Past Medical History:  Diagnosis Date  . Arthritis    hands  . Asthma   . Atrial fibrillation ([place])   . Cancer [facility])    pancreatic  . Chronic lower back pain   . COPD (chronic obstructive pulmonary disease) ([place])   . Depression   . Diabetes ([place])    type 2  . Diabetic neuropathy ([facility])    feet and legs  . Dyspnea   . GERD (gastroesophageal reflux disease)   . Headache    migraines  . Hypercholesteremia   . Hypertension   . Neuropathy    both feet  . Pancreatic mass 03/21/2017  . Pancreatic mass   . Pneumonia 1995 severe case   2 times before that  . Seizures ([place]) as child  to age 57   with high fevers  . Sleep apnea    cpap uses sometimes  . Umbilical hernia    Past Surgical History:  Procedure Laterality Date  . CESAREAN SECTION     x 1  . EUS N/A 03/30/2017   Procedure: UPPER ENDOSCOPIC ULTRASOUND (EUS) LINEAR;  Surgeon: Milus Banister, MD;  Location: [facility];  Service: Endoscopy;  Laterality: N/A;  . HERNIA REPAIR     x 2 umbilical   . PORTACATH PLACEMENT N/A 04/21/2017   Procedure: INSERTION PORT-A-CATH;  Surgeon: Aviva Signs, MD;  Location: [facility];  Service: General;  Laterality: N/A;  . UMBILICAL HERNIA REPAIR     multiple times     SOCIAL HISTORY:  Social History   Social History  . Marital status: Married    Spouse name: N/A  . Number of children: N/A  . Years of education: N/A   Occupational History  . Not on file.   Social History Main Topics  . Smoking status: Former Smoker    Packs/day: 1.00    Years: 20.00    Types: Cigarettes    Quit date: 03/21/1994  . Smokeless tobacco: Never Used  . Alcohol use No  . Drug use: No  . Sexual activity: No   Other Topics Concern  . Not on file   Social History Narrative  . No narrative on file    FAMILY HISTORY:  Family History  Problem Relation Age of Onset  . Heart disease Mother   . Dementia Father     CURRENT MEDICATIONS:  Outpatient  Encounter Prescriptions as of 05/24/2017  Medication Sig Note  . aspirin EC 81 MG tablet Take 81 mg by mouth daily.   Marland Kitchen atorvastatin (LIPITOR) 10 MG tablet Take 10 mg by mouth every morning.    . cetirizine (ZYRTEC) 10 MG tablet Take 10 mg by mouth at bedtime.   . citalopram (CELEXA) 20 MG tablet Take 20 mg by mouth every morning.    . Diphenhyd-Hydrocort-Nystatin (FIRST-DUKES MOUTHWASH) SUSP Use as directed 5 mLs in the mouth or throat every 4 (four) hours as needed.   . diphenoxylate-atropine (LOMOTIL) 2.5-0.025 MG tablet Take 1 tablet by mouth 4 (four) times daily as needed for diarrhea or loose stools.   Marland Kitchen  gabapentin (NEURONTIN) 300 MG capsule Take 300 mg by mouth 2 (two) times daily.   . Gemcitabine HCl (GEMZAR IV) Inject into the vein. Day 1, 8, 15 every 28 days   . glipiZIDE (GLUCOTROL XL) 10 MG 24 hr tablet Take 10 mg by mouth daily with breakfast.    . hydrochlorothiazide (HYDRODIURIL) 25 MG tablet Take 25 mg by mouth at bedtime.    Marland Kitchen HYDROcodone-acetaminophen (NORCO/VICODIN) 5-325 MG tablet Take 1 tablet by mouth every 4 (four) hours as needed for moderate pain or severe pain.   Marland Kitchen lidocaine (XYLOCAINE) 2 % solution Use as directed 5 mLs in the mouth or throat every 4 (four) hours as needed for mouth pain. Mixed with [employer] Magic Mouthwash   . lidocaine-prilocaine (EMLA) cream Apply to affected area once   . LORazepam (ATIVAN) 0.5 MG tablet Take 1 tablet (0.5 mg total) by mouth every 6 (six) hours as needed (Nausea or vomiting). 04/14/2017: hasnt started  . losartan (COZAAR) 100 MG tablet Take 100 mg by mouth every morning.    . metFORMIN (GLUCOPHAGE) 1000 MG tablet Take 1,000 mg by mouth 2 (two) times daily with a meal.    . metoprolol succinate (TOPROL-XL) 50 MG 24 hr tablet Take 50 mg by mouth every morning.    . montelukast (SINGULAIR) 10 MG tablet Take 10 mg by mouth every morning.    . Multiple Vitamins-Minerals (MULTIVITAMIN GUMMIES ADULT) CHEW Chew 2 tablets by mouth daily.   Marland Kitchen  omeprazole (PRILOSEC) 20 MG capsule Take 40 mg by mouth every morning.    . ondansetron (ZOFRAN) 8 MG tablet Take 1 tablet (8 mg total) by mouth 2 (two) times daily as needed (Nausea or vomiting).   Marland Kitchen PACLitaxel Protein-Bound Part (ABRAXANE IV) Inject into the vein. Day 1, 8, 15 every 28 days   . promethazine (PHENERGAN) 25 MG tablet Take 25 mg by mouth every 6 (six) hours as needed for nausea or vomiting.    . Simethicone (ALKA-SELTZER ANTI-GAS PO) Take 1 tablet by mouth daily as needed (gas).   . [DISCONTINUED] HYDROcodone-acetaminophen (NORCO/VICODIN) 5-325 MG tablet Take 0.5 tablets by mouth every 6 (six) hours as needed for moderate pain.    . [DISCONTINUED] HYDROcodone-acetaminophen (NORCO/VICODIN) 5-325 MG tablet Take 1 tablet by mouth every 4 (four) hours as needed for moderate pain or severe pain.    No facility-administered encounter medications on file as of 05/24/2017.     ALLERGIES:  Allergies  Allergen Reactions  . Sulfa Antibiotics Itching     PHYSICAL EXAM:  ECOG Performance status: 2 - Symptomatic; requires occasional assistance.      Physical Exam  Constitutional: She is oriented to person, place, and time.  Chronically-ill appearing female in no acute distress.  -Seen and evaluated in chemo chair in infusion area   HENT:  Head: Normocephalic.  Mouth/Throat: Oropharynx is clear and moist.  Eyes: Conjunctivae are normal. No scleral icterus.  Neck: Normal range of motion. Neck supple.  Cardiovascular: Normal rate and regular rhythm.   Pulmonary/Chest: Effort normal. No respiratory distress. She has no wheezes.  -Port-a-cath appears tilted and very flush to her skin. The scar is intact with no obvious ecchymosis/evidence of recent trauma.  -Diminished breath sounds bilat bases, otherwise clear to auscultation bilat.   Abdominal: Soft. There is tenderness (Mild tenderness noted to LUQ on palpation).  Musculoskeletal: Normal range of motion. She exhibits edema (1+  BLE non-pitting edema; (R) ankle larger than (L). ).  Mild bruising noted to top of  right foot (possibly d/t recent falls per patient/daughter).   Lymphadenopathy:    She has no cervical adenopathy.  Neurological: She is alert and oriented to person, place, and time. No cranial nerve deficit.  Skin: Skin is warm and dry. There is pallor.  Psychiatric: Mood, memory, affect and judgment normal.  Nursing note and vitals reviewed.    LABORATORY DATA:  I have reviewed the labs as listed.  CBC    Component Value Date/Time   WBC 16.8 (H) 05/24/2017 1136   RBC 3.22 (L) 05/24/2017 1136   HGB 9.3 (L) 05/24/2017 1136   HCT 28.1 (L) 05/24/2017 1136   PLT 477 (H) 05/24/2017 1136   MCV 87.3 05/24/2017 1136   MCH 28.9 05/24/2017 1136   MCHC 33.1 05/24/2017 1136   RDW 15.6 (H) 05/24/2017 1136   LYMPHSABS 3.4 05/24/2017 1136   MONOABS 1.0 05/24/2017 1136   EOSABS 0.3 05/24/2017 1136   BASOSABS 0.0 05/24/2017 1136   CMP Latest Ref Rng & Units 05/24/2017 05/19/2017 05/18/2017  Glucose 65 - 99 mg/dL 144(H) 246(H) 140(H)  BUN 6 - 20 mg/dL 12 15 20   Creatinine 0.44 - 1.00 mg/dL 1.12(H) 1.06(H) 1.35(H)  Sodium 135 - 145 mmol/L 137 133(L) 127(L)  Potassium 3.5 - 5.1 mmol/L 4.2 4.4 4.3  Chloride 101 - 111 mmol/L 103 103 97(L)  CO2 22 - 32 mmol/L 26 21(L) 23  Calcium 8.9 - 10.3 mg/dL 7.0(L) 7.0(L) 7.2(L)  Total Protein 6.5 - 8.1 g/dL 4.9(L) - -  Total Bilirubin 0.3 - 1.2 mg/dL 0.7 - -  Alkaline Phos 38 - 126 U/L 64 - -  AST 15 - 41 U/L 29 - -  ALT 14 - 54 U/L 18 - -    PENDING LABS:    DIAGNOSTIC IMAGING:  *The following radiologic images and reports have been reviewed independently and agree with below findings.  PET scan: 03/27/17      PATHOLOGY:  EBUS pancreas FNA path: 03/30/17        ASSESSMENT & PLAN:   Stage IB adenocarcinoma of pancreas:  -Diagnosed in 03/2017 after EBUS with FNA of pancreas revealed adenocarcinoma. Staging PET scan did not reveal any evidence of metastatic  disease.  Evaluated at [facility] for consideration of surgical resection; they recommend neoadjuvant chemotherapy x 2 cycles, then restaging imaging to re-assess resectability. She may require a total of 4 cycles of therapy before surgery. Cycle #1 Gemzar/Abraxane on days 1, 8, & 15 of every 28 day cycle started on 04/26/17.   -Here for cycle #2 today. Labs reviewed and are adequate for treatment. However, her port-a-cath is malfunctioning. Discussed with patient, nursing, and Dr. Oliva Bustard. We will hold chemotherapy and obtain STAT dye study to further eval port-a-cath.   -She will need restaging imaging with CT chest/abd/pelvis after cycle #2 of chemotherapy. Will order when we confirm definitive start date of cycle #2 of therapy.  -Paper prescription for cranial prosthesis given to patient today per her request.    Addendum:  *Chemotherapy held today as a result of malpositioned port/results of dye study.   *Referral back to Dr. Arnoldo Morale for port revision. Will tentatively reschedule her cycle #1, day 1 chemo for 2 weeks out, but we can bring her back sooner for next cycle of therapy if Dr. Arnoldo Morale can fix port sooner and allow her to heal adequately.  *Patient agrees with this plan.    Malfunctioning port-a-cath:  -Patient and nursing with concerns regarding malfunctioning/malpositioned port-a-cath. No blood  return noted per nursing. Patient reports recent trauma to the area s/p recent hospitalization where she was confused; the port has not been working well since that time per patient.  -Will hold chemotherapy and obtain STAT port dye study.  If port-a-cath in appropriate position, then will proceed with this cycle of chemo.  Awaiting results of dye study.   Addendum:  *Port dye study revealed malpositioned port, now in the brachiocephalic vein; also there are 2 kinks in the catheter. Discussed results with Dr. Pascal Lux in radiology. He recommends either port revision with Dr.  Arnoldo Morale or IR procedure in [place] where they can go through the patient's groin to reposition the catheter tip.  Discussed results with patient. Will refer her back to Dr. Arnoldo Morale for port revision.   *Chemo held today. I will call Dr. Arnoldo Morale to see if he can do port revision procedure as soon as able.  Dye study results:    Diarrhea/At risk for dehydration/Decreased appetite:  -Could be secondary to chemotherapy vs pancreatic enzyme dysfunction given malignancy.  -She has prescription for Lomotil, but was only using once per day and sparingly. Recommended she schedule the Lomotil 3-4 times per day, with caution not to become constipated.  -She is at risk for dehydration in the setting of recent hospitalization for dehydration, diarrhea, current chemotherapy, and inadequate oral intake. Encouraged her to continue to force non-caffeinated/non-alcoholic fluid intake. Encouraged her to continue to eat small, frequent meals per day. Stressed the importance of adequate caloric and protein intake while undergoing cancer treatments.    BLE non-pitting edema:  -Likely secondary to hypoalbuminemia. However, patient is at high-risk of DVT given malignancy and mildly decreased mobility. I have very low clinical suspicion of DVT, but will obtain STAT bilat LE doppler ultrasound evaluations to confirm.   -Encouraged continued use of diuretics, as previously prescribed. Continue to keep legs elevated when seated/lying.    Addendum:  *BLE doppler ultrasounds negative for DVT bilaterally.   Doppler US results:     Neoplasm related pain:  -Norco 5/325 has been effective in managing her pain.   -[place] Controlled Substance Registry reviewed and refill is appropriate. Paper prescription given to patient today.     Recent falls:  -Likely secondary to dehydration. Encouraged her to use assistive devices appropriately to prevent future falls. She understands the importance of safety given the potential  for decreased blood counts and the possibility of serious injuries with falls during chemotherapy. Encouraged her to force oral intake as tolerated, particularly fluids.  BP and pulse are stable today. BUN normal; creatinine 1.12, but largely improved from when she was hospitalized for dehydration when creatinine was 1.6.  We will continue to monitor.  -If symptoms worsen or recur, then can consider obtaining MRI brain to evaluate for metastatic disease. Clinically, I do not this think this is necessary at this time, but we will certainly continue to monitor.         Dispo:  -Urgent referral to Dr. Arnoldo Morale for port revision so we can resume chemotherapy.  -Return to cancer center in 2 weeks, or sooner, after port revision for day 1, cycle #2 Gemzar/Abraxane.    All questions were answered to patient's stated satisfaction. Encouraged patient to call with any new concerns or questions before her next visit to the cancer center and we can certain see her sooner, if needed.    Plan of care discussed with Dr. Oliva Bustard, who agrees with the above aforementioned.    Orders placed this  encounter:  Orders Placed This Encounter  Procedures  . DG CV Line Injection  . US Venous Img Lower Unilateral Right      Mike Craze, NP [place] [phone]

## 2017-05-25 ENCOUNTER — Encounter (HOSPITAL_COMMUNITY)
Admission: RE | Admit: 2017-05-25 | Discharge: 2017-05-25 | Disposition: A | Discharge status: Home / Self Care | Payer: Medicare Other | Source: Ambulatory Visit | Attending: General Surgery | Admitting: General Surgery

## 2017-05-25 ENCOUNTER — Other Ambulatory Visit (HOSPITAL_COMMUNITY): Payer: Self-pay | Admitting: Adult Health

## 2017-05-25 ENCOUNTER — Other Ambulatory Visit (HOSPITAL_COMMUNITY): Payer: Self-pay | Admitting: General Surgery

## 2017-05-25 ENCOUNTER — Encounter (HOSPITAL_COMMUNITY): Payer: Self-pay

## 2017-05-25 DIAGNOSIS — C259 Malignant neoplasm of pancreas, unspecified: Secondary | ICD-10-CM

## 2017-05-25 DIAGNOSIS — Z452 Encounter for adjustment and management of vascular access device: Secondary | ICD-10-CM

## 2017-05-25 LAB — CANCER ANTIGEN 19-9: CA 19-9: 41 U/mL — ABNORMAL HIGH (ref 0–35)

## 2017-05-25 NOTE — H&P (Addendum)
Subjective: Patient is a 71 y.o. female presents with a non-functioning port-a-cath. Patient currently reports the following symptoms: none.  Onset of symptoms was abrupt starting 1 week ago with stable course since that time. She began to have problems last week during a hospital admission when her port was accessed and became dislodged.  When it was replaced, the nurse noted that it could flush but not aspirate.  She presented to oncology today for her next chemotherapy appt when the port was not functioning.  And XR was performed noting a kink in the catheter.  She was then sent to the office for evaluation.  Patient's original surgery was on 04/21/2017 for left subclavian port placement.     Patient Active Problem List   Diagnosis Date Noted  . AKI (acute kidney injury) ([place]) 05/17/2017  . Hyponatremia 05/17/2017  . Diarrhea 05/17/2017  . Malignant neoplasm of pancreas ([place]) 04/06/2017  . Pancreatic mass 03/21/2017  . Type 2 diabetes mellitus ([place]) 01/24/2017  . Hypertensive disorder 01/24/2017  . Depressive disorder 01/24/2017  . Hypercholesterolemia 01/24/2017  . Chronic obstructive lung disease ([place]) 01/24/2017  . Asthma 01/24/2017   Past Medical History:  Diagnosis Date  . Arthritis    hands  . Asthma   . Atrial fibrillation ([place])   . Cancer [facility])    pancreatic  . Chronic lower back pain   . COPD (chronic obstructive pulmonary disease) ([place])   . Depression   . Diabetes ([place])    type 2  . Diabetic neuropathy ([facility])    feet and legs  . Dyspnea   . GERD (gastroesophageal reflux disease)   . Headache    migraines  . Hypercholesteremia   . Hypertension   . Neuropathy    both feet  . Pancreatic mass 03/21/2017  . Pancreatic mass   . Pneumonia 1995 severe case   2 times before that  . Seizures ([place]) as child  to age 15   with high fevers  . Sleep apnea    cpap uses sometimes  . Umbilical hernia     Past Surgical History:  Procedure Laterality Date  . CESAREAN SECTION      x 1  . EUS N/A 03/30/2017   Procedure: UPPER ENDOSCOPIC ULTRASOUND (EUS) LINEAR;  Surgeon: Milus Banister, MD;  Location: [facility];  Service: Endoscopy;  Laterality: N/A;  . HERNIA REPAIR     x 2 umbilical   . PORTACATH PLACEMENT N/A 04/21/2017   Procedure: INSERTION PORT-A-CATH;  Surgeon: Aviva Signs, MD;  Location: [facility];  Service: General;  Laterality: N/A;  . UMBILICAL HERNIA REPAIR     multiple times    No prescriptions prior to admission.   Allergies  Allergen Reactions  . Sulfa Antibiotics Itching    Social History  Substance Use Topics  . Smoking status: Former Smoker    Packs/day: 1.00    Years: 20.00    Types: Cigarettes    Quit date: 03/21/1994  . Smokeless tobacco: Never Used  . Alcohol use No    Family History  Problem Relation Age of Onset  . Heart disease Mother   . Dementia Father     Review of Systems Pertinent items noted in HPI and remainder of comprehensive ROS otherwise negative.  Objective: Vital signs in last 24 hours:    There were no vitals taken for this visit.  General Appearance:    Alert, cooperative, no distress, appears stated age  Head:    Normocephalic, without obvious abnormality,  atraumatic  Eyes:    PERRL, conjunctiva/corneas clear, EOM's intact, fundi    benign, both eyes  Ears:    Normal TM's and external ear canals, both ears  Nose:   Nares normal, septum midline, mucosa normal, no drainage    or sinus tenderness  Throat:   Lips, mucosa, and tongue normal; teeth and gums normal  Neck:   Supple, symmetrical, trachea midline, no adenopathy;    thyroid:  no enlargement/tenderness/nodules; no carotid   bruit or JVD  Back:     Symmetric, no curvature, ROM normal, no CVA tenderness  Lungs:     Clear to auscultation bilaterally, respirations unlabored  Chest Wall:    No tenderness or deformity   Heart:    Regular rate and rhythm, S1 and S2 normal, no murmur, rub   or gallop  Breast Exam:    No tenderness, masses, or  nipple abnormality  Abdomen:     Soft, non-tender, bowel sounds active all four quadrants,    no masses, no organomegaly  Genitalia:    Normal female without lesion, discharge or tenderness  Rectal:    Normal tone, normal prostate, no masses or tenderness;   guaiac negative stool  Extremities:   Extremities normal, atraumatic, no cyanosis or edema  Pulses:   2+ and symmetric all extremities  Skin:   Left port site with mild erythema, nontender, no signs of drainage  Lymph nodes:   Cervical, supraclavicular, and axillary nodes normal  Neurologic:   CNII-XII intact, normal strength, sensation and reflexes    throughout    Data Review CBC:  Lab Results  Component Value Date   WBC 16.8 (H) 05/24/2017   RBC 3.22 (L) 05/24/2017    Assessment/Plan: 71 y/o female with pancreatic cancer and malfunctioning port.  Plan for removal of left subclavian port and placement of right IJ port tomorrow.  R/B/A including but not limited to  Bleeding, infection, poor wound healing, pneumothorax, and discomfort was all discussed with the patient and her daughter.     No changes in H&P overnight

## 2017-05-26 ENCOUNTER — Ambulatory Visit (HOSPITAL_COMMUNITY): Payer: Medicare Other

## 2017-05-26 ENCOUNTER — Ambulatory Visit (HOSPITAL_COMMUNITY)
Admission: RE | Admit: 2017-05-26 | Discharge: 2017-05-26 | Disposition: A | Discharge status: Home / Self Care | Payer: Medicare Other | Source: Ambulatory Visit | Attending: General Surgery | Admitting: General Surgery

## 2017-05-26 ENCOUNTER — Ambulatory Visit (HOSPITAL_COMMUNITY): Payer: Medicare Other | Admitting: Anesthesiology

## 2017-05-26 ENCOUNTER — Encounter (HOSPITAL_COMMUNITY): Payer: Self-pay | Admitting: *Deleted

## 2017-05-26 ENCOUNTER — Other Ambulatory Visit (HOSPITAL_COMMUNITY): Payer: Self-pay | Admitting: Adult Health

## 2017-05-26 ENCOUNTER — Encounter (HOSPITAL_COMMUNITY)
Admission: RE | Disposition: A | Discharge status: Home / Self Care | Payer: Self-pay | Source: Ambulatory Visit | Attending: General Surgery

## 2017-05-26 DIAGNOSIS — Z7984 Long term (current) use of oral hypoglycemic drugs: Secondary | ICD-10-CM | POA: Diagnosis not present

## 2017-05-26 DIAGNOSIS — C259 Malignant neoplasm of pancreas, unspecified: Secondary | ICD-10-CM | POA: Insufficient documentation

## 2017-05-26 DIAGNOSIS — Z79899 Other long term (current) drug therapy: Secondary | ICD-10-CM | POA: Insufficient documentation

## 2017-05-26 DIAGNOSIS — I4891 Unspecified atrial fibrillation: Secondary | ICD-10-CM | POA: Insufficient documentation

## 2017-05-26 DIAGNOSIS — G4730 Sleep apnea, unspecified: Secondary | ICD-10-CM | POA: Insufficient documentation

## 2017-05-26 DIAGNOSIS — T80212A Local infection due to central venous catheter, initial encounter: Secondary | ICD-10-CM

## 2017-05-26 DIAGNOSIS — Z87891 Personal history of nicotine dependence: Secondary | ICD-10-CM | POA: Diagnosis not present

## 2017-05-26 DIAGNOSIS — F329 Major depressive disorder, single episode, unspecified: Secondary | ICD-10-CM | POA: Diagnosis not present

## 2017-05-26 DIAGNOSIS — E7800 Pure hypercholesterolemia, unspecified: Secondary | ICD-10-CM | POA: Insufficient documentation

## 2017-05-26 DIAGNOSIS — J449 Chronic obstructive pulmonary disease, unspecified: Secondary | ICD-10-CM | POA: Insufficient documentation

## 2017-05-26 DIAGNOSIS — I10 Essential (primary) hypertension: Secondary | ICD-10-CM | POA: Diagnosis not present

## 2017-05-26 DIAGNOSIS — Z452 Encounter for adjustment and management of vascular access device: Secondary | ICD-10-CM | POA: Insufficient documentation

## 2017-05-26 DIAGNOSIS — E1140 Type 2 diabetes mellitus with diabetic neuropathy, unspecified: Secondary | ICD-10-CM | POA: Diagnosis not present

## 2017-05-26 DIAGNOSIS — E871 Hypo-osmolality and hyponatremia: Secondary | ICD-10-CM | POA: Diagnosis not present

## 2017-05-26 DIAGNOSIS — Z7982 Long term (current) use of aspirin: Secondary | ICD-10-CM | POA: Diagnosis not present

## 2017-05-26 DIAGNOSIS — N179 Acute kidney failure, unspecified: Secondary | ICD-10-CM | POA: Diagnosis not present

## 2017-05-26 DIAGNOSIS — K219 Gastro-esophageal reflux disease without esophagitis: Secondary | ICD-10-CM | POA: Diagnosis not present

## 2017-05-26 DIAGNOSIS — Z95828 Presence of other vascular implants and grafts: Secondary | ICD-10-CM

## 2017-05-26 HISTORY — PX: PORTACATH PLACEMENT: SHX2246

## 2017-05-26 HISTORY — PX: PORT-A-CATH REMOVAL: SHX5289

## 2017-05-26 LAB — GLUCOSE, CAPILLARY
Glucose-Capillary: 101 mg/dL — ABNORMAL HIGH (ref 65–99)
Glucose-Capillary: 109 mg/dL — ABNORMAL HIGH (ref 65–99)

## 2017-05-26 SURGERY — REMOVAL PORT-A-CATH
Anesthesia: Monitor Anesthesia Care | Laterality: Right

## 2017-05-26 MED ORDER — PROPOFOL 10 MG/ML IV BOLUS
INTRAVENOUS | Status: AC
  Filled 2017-05-26: qty 40

## 2017-05-26 MED ORDER — CHLORHEXIDINE GLUCONATE CLOTH 2 % EX PADS: 6.0000 | MEDICATED_PAD | Freq: Once | CUTANEOUS | Status: DC

## 2017-05-26 MED ORDER — LIDOCAINE HCL (PF) 1 % IJ SOLN
INTRAMUSCULAR | Status: DC | PRN
  Administered 2017-05-26: 16 mL

## 2017-05-26 MED ORDER — SODIUM CHLORIDE 0.9 % IV SOLN: INTRAVENOUS | Status: DC

## 2017-05-26 MED ORDER — FENTANYL CITRATE (PF) 100 MCG/2ML IJ SOLN
25.0000 ug | Freq: Once | INTRAMUSCULAR | Status: AC
  Administered 2017-05-26: 25 ug via INTRAVENOUS

## 2017-05-26 MED ORDER — FENTANYL CITRATE (PF) 100 MCG/2ML IJ SOLN
INTRAMUSCULAR | Status: AC
  Filled 2017-05-26: qty 2

## 2017-05-26 MED ORDER — SODIUM CHLORIDE 0.9 % IJ SOLN
INTRAMUSCULAR | Status: AC
Start: 2017-05-26 — End: 2017-05-26
  Filled 2017-05-26: qty 40

## 2017-05-26 MED ORDER — MIDAZOLAM HCL 2 MG/2ML IJ SOLN
INTRAMUSCULAR | Status: AC
  Filled 2017-05-26: qty 2

## 2017-05-26 MED ORDER — HEPARIN SOD (PORK) LOCK FLUSH 100 UNIT/ML IV SOLN
INTRAVENOUS | Status: DC | PRN
  Administered 2017-05-26: 500 [IU] via INTRAVENOUS

## 2017-05-26 MED ORDER — PROPOFOL 500 MG/50ML IV EMUL
INTRAVENOUS | Status: DC | PRN
  Administered 2017-05-26: 08:00:00 via INTRAVENOUS
  Administered 2017-05-26: 120 ug/kg/min via INTRAVENOUS
  Administered 2017-05-26: 08:00:00 via INTRAVENOUS

## 2017-05-26 MED ORDER — GABAPENTIN 300 MG PO CAPS
300.0000 mg | ORAL_CAPSULE | ORAL | Status: DC
  Filled 2017-05-26: qty 1

## 2017-05-26 MED ORDER — LACTATED RINGERS IV SOLN
INTRAVENOUS | Status: DC
  Administered 2017-05-26: 07:00:00 via INTRAVENOUS

## 2017-05-26 MED ORDER — EPHEDRINE SULFATE 50 MG/ML IJ SOLN
INTRAMUSCULAR | Status: AC
Start: 2017-05-26 — End: 2017-05-26
  Filled 2017-05-26: qty 1

## 2017-05-26 MED ORDER — SUCCINYLCHOLINE CHLORIDE 20 MG/ML IJ SOLN
INTRAMUSCULAR | Status: AC
  Filled 2017-05-26: qty 1

## 2017-05-26 MED ORDER — HEPARIN SOD (PORK) LOCK FLUSH 100 UNIT/ML IV SOLN
INTRAVENOUS | Status: AC
  Filled 2017-05-26: qty 5

## 2017-05-26 MED ORDER — SODIUM CHLORIDE 0.9 % IV SOLN
INTRAVENOUS | Status: AC | PRN
  Administered 2017-05-26: 100 mL via INTRAMUSCULAR

## 2017-05-26 MED ORDER — MIDAZOLAM HCL 2 MG/2ML IJ SOLN
1.0000 mg | INTRAMUSCULAR | Status: AC
  Administered 2017-05-26: 2 mg via INTRAVENOUS

## 2017-05-26 MED ORDER — LIDOCAINE HCL (PF) 1 % IJ SOLN
INTRAMUSCULAR | Status: AC
  Filled 2017-05-26: qty 30

## 2017-05-26 MED ORDER — SODIUM CHLORIDE 0.9 % IJ SOLN
INTRAMUSCULAR | Status: AC
  Filled 2017-05-26: qty 10

## 2017-05-26 MED ORDER — CEFAZOLIN SODIUM-DEXTROSE 2-4 GM/100ML-% IV SOLN
2.0000 g | INTRAVENOUS | Status: AC
  Administered 2017-05-26: 2 g via INTRAVENOUS
  Filled 2017-05-26: qty 100

## 2017-05-26 MED ORDER — PROPOFOL 10 MG/ML IV BOLUS
INTRAVENOUS | Status: AC
  Filled 2017-05-26: qty 20

## 2017-05-26 SURGICAL SUPPLY — 42 items
APPLIER CLIP 9.375 SM OPEN (CLIP)
BAG DECANTER FOR FLEXI CONT (MISCELLANEOUS) ×3 IMPLANT
BAG HAMPER (MISCELLANEOUS) ×3 IMPLANT
CATH HICKMAN DUAL 12.0 (CATHETERS) IMPLANT
CHLORAPREP W/TINT 10.5 ML (MISCELLANEOUS) ×3 IMPLANT
CLIP APPLIE 9.375 SM OPEN (CLIP) IMPLANT
CLOTH BEACON ORANGE TIMEOUT ST (SAFETY) ×3 IMPLANT
COVER LIGHT HANDLE STERIS (MISCELLANEOUS) ×6 IMPLANT
DECANTER SPIKE VIAL GLASS SM (MISCELLANEOUS) ×3 IMPLANT
DERMABOND ADVANCED (GAUZE/BANDAGES/DRESSINGS) ×1
DERMABOND ADVANCED .7 DNX12 (GAUZE/BANDAGES/DRESSINGS) ×2 IMPLANT
DRAPE C-ARM FOLDED MOBILE STRL (DRAPES) ×3 IMPLANT
DRSG TEGADERM 6X6 (GAUZE/BANDAGES/DRESSINGS) ×3 IMPLANT
ELECT REM PT RETURN 9FT ADLT (ELECTROSURGICAL) ×3
ELECTRODE REM PT RTRN 9FT ADLT (ELECTROSURGICAL) ×2 IMPLANT
GAUZE PACKING IODOFORM 1/4X15 (GAUZE/BANDAGES/DRESSINGS) ×3 IMPLANT
GLOVE BIOGEL PI IND STRL 7.0 (GLOVE) ×2 IMPLANT
GLOVE BIOGEL PI IND STRL 8.5 (GLOVE) ×2 IMPLANT
GLOVE BIOGEL PI INDICATOR 7.0 (GLOVE) ×1
GLOVE BIOGEL PI INDICATOR 8.5 (GLOVE) ×1
GLOVE ECLIPSE 8.5 STRL (GLOVE) ×3 IMPLANT
GOWN STRL REUS W/TWL LRG LVL3 (GOWN DISPOSABLE) ×3 IMPLANT
GOWN STRL REUS W/TWL XL LVL3 (GOWN DISPOSABLE) ×3 IMPLANT
IV NS 500ML (IV SOLUTION) ×1
IV NS 500ML BAXH (IV SOLUTION) ×2 IMPLANT
KIT PORT POWER 8FR ISP MRI (Port) ×3 IMPLANT
KIT ROOM TURNOVER APOR (KITS) ×3 IMPLANT
MANIFOLD NEPTUNE II (INSTRUMENTS) ×3 IMPLANT
NEEDLE HYPO 25X1 1.5 SAFETY (NEEDLE) ×3 IMPLANT
PACK MINOR (CUSTOM PROCEDURE TRAY) ×3 IMPLANT
PAD ARMBOARD 7.5X6 YLW CONV (MISCELLANEOUS) ×3 IMPLANT
SET BASIN LINEN APH (SET/KITS/TRAYS/PACK) ×3 IMPLANT
SET INTRODUCER 12FR PACEMAKER (SHEATH) IMPLANT
SHEATH COOK PEEL AWAY SET 8F (SHEATH) IMPLANT
SPONGE GAUZE 2X2 8PLY STRL LF (GAUZE/BANDAGES/DRESSINGS) ×3 IMPLANT
SUT MNCRL AB 4-0 PS2 18 (SUTURE) IMPLANT
SUT PROLENE 3 0 PS 2 (SUTURE) IMPLANT
SUT VIC AB 3-0 SH 27 (SUTURE) ×1
SUT VIC AB 3-0 SH 27X BRD (SUTURE) ×2 IMPLANT
SUT VIC AB 4-0 PS2 27 (SUTURE) IMPLANT
SYR 20CC LL (SYRINGE) ×3 IMPLANT
SYR CONTROL 10ML LL (SYRINGE) ×3 IMPLANT

## 2017-05-26 NOTE — Transfer of Care (Signed)
Immediate Anesthesia Transfer of Care Note  Patient: Renee Jackson  Procedure(s) Performed: Procedure(s): REMOVAL PORT-A-CATH (Left) INSERTION PORT-A-CATH (Right)  Patient Location: PACU  Anesthesia Type:MAC  Level of Consciousness: awake, alert , oriented and patient cooperative  Airway & Oxygen Therapy: Patient Spontanous Breathing  Post-op Assessment: Report given to RN and Post -op Vital signs reviewed and stable  Post vital signs: Reviewed and stable  Last Vitals:  Vitals:   05/26/17 0720 05/26/17 0725  BP: (!) 151/119 (!) 163/107  Resp: 20 (!) 21  Temp:      Last Pain:  Vitals:   05/26/17 0637  TempSrc: Oral  PainSc: 7          Complications: No apparent anesthesia complications

## 2017-05-26 NOTE — Op Note (Signed)
Procedure(s): REMOVAL PORT-A-CATH INSERTION PORT-A-CATH Procedure Note  [name] female 71 y.o. 05/26/2017  Procedure(s) and Anesthesia Type:    * REMOVAL PORT-A-CATH - Choice    * INSERTION PORT-A-CATH - Choice  Surgeon(s) and Role:    Andres Labrum, MD - Primary   Indications: The patient was admitted to the hospital with a brief history of right-sided abdominal pain. A chest xray revealed findings of a kinked catheter and the catheter was determined to no flush or aspirate. The patient now presents for removal of port and replacing with a new access site,  after discussing therapeutic alternatives.        Surgeon: Andres Labrum   Assistants: n/a  Anesthesia: Monitored Local Anesthesia with Sedation  ASA Class: 3    Procedure Detail  REMOVAL PORT-A-CATH, INSERTION PORT-A-CATH  Findings: The patient was taken to the operating room and placed supine on the operative table.  After adequate anesthesia was induced and timeout performed, the chest and neck was prepped and draped in sterile fashion.  Local anesthetic was injected at the site of the previous port and a transverse incision was made.  Electrocautery was used to maintain hemostasis and dissect the port from the subcutaneous tissue.  There turbid fluid noted around the port.  The port was easily elevated out of the wound and catheter removed in its entirety.  The wound was irrigated with normal saline and 3-0 Vicryl was used to suture close the catheter tract.  The skin edges were reapproximated leaving the central one third of the incision open and then packed with quarter inch packing strip.  Then using Ultrasound, the right Internal Jugular vein was identified and a large bore needle was used to access the vein under direct visualization of the [place].  The syringe was removed and a guide wire was advanced through the needle into the Jugular vein.  The guide wire placement in the vena cava was confirmed with  fluoroscopy and the needle removed.  The right chest was injected with local anesthetic and a transverse incision made to accommodate the port.  Electrocautery was used to dissect through the subcutaneous tissue and create a pocket for the port.  Fluoroscopy was used to measure the appropriate length of the catheter from the subq pocket to the superior vena cava prior to transecting the catheter.  The tunneler was then attached to the catheter and used to tunnel the catheter to the exit site of the guide wire.  The dilator and sheath was advanced over the guide wire into jugular vein with fluoroscopic guidance and the guide wire and inner sheath removed.  The catheter was then advanced into the jugular sheath and the sheath peeled away as the catheter was advanced further into the jugular vein.  Its placement in the vena cava was confirmed with fluoroscopy.  The skin at the port was irrigated with normal saline and the skin edges reapproximated using 3-0 Vicryl and 4-0 monocryl.    Estimated Blood Loss:  Minimal         Drains: none         Total IV Fluids: 79ml  Blood Given: none          Specimens: none         Implants: MRI compatible power port        Complications:  * No complications entered in OR log *         Disposition: PACU - hemodynamically stable.  Condition: stable

## 2017-05-26 NOTE — Anesthesia Preprocedure Evaluation (Signed)
Anesthesia Evaluation  Patient identified by MRN, date of birth, ID band Patient awake    Reviewed: Allergy & Precautions, NPO status , Patient's Chart, lab work & pertinent test results  Airway Mallampati: II  TM Distance: >3 FB Neck ROM: Full    Dental no notable dental hx. (+) Dental Advisory Given   Pulmonary shortness of breath, asthma , sleep apnea and Continuous Positive Airway Pressure Ventilation , COPD, former smoker,    breath sounds clear to auscultation       Cardiovascular hypertension, Pt. on medications  Rhythm:Regular Rate:Normal     Neuro/Psych  Headaches, Seizures - (inactive),  PSYCHIATRIC DISORDERS Depression    GI/Hepatic GERD  ,  Endo/Other  diabetes, Type 2Morbid obesity  Renal/GU      Musculoskeletal   Abdominal (+) + obese,   Peds  Hematology   Anesthesia Other Findings   Reproductive/Obstetrics                             Anesthesia Physical Anesthesia Plan  ASA: III  Anesthesia Plan: MAC   Post-op Pain Management:    Induction: Intravenous  PONV Risk Score and Plan:   Airway Management Planned: Simple Face Mask  Additional Equipment:   Intra-op Plan:   Post-operative Plan:   Informed Consent: I have reviewed the patients History and Physical, chart, labs and discussed the procedure including the risks, benefits and alternatives for the proposed anesthesia with the patient or authorized representative who has indicated his/her understanding and acceptance.     Plan Discussed with: CRNA  Anesthesia Plan Comments:         Anesthesia Quick Evaluation

## 2017-05-26 NOTE — Interval H&P Note (Signed)
History and Physical Interval Note:  05/26/2017 7:14 AM  Renee Jackson  has presented today for surgery, with the diagnosis of pancreatic cancer  The various methods of treatment have been discussed with the patient and family. After consideration of risks, benefits and other options for treatment, the patient has consented to  Procedure(s): REMOVAL PORT-A-CATH (Left) INSERTION PORT-A-CATH (Right) as a surgical intervention .  The patient's history has been reviewed, patient examined, no change in status, stable for surgery.  I have reviewed the patient's chart and labs.  Questions were answered to the patient's satisfaction.     Andres Labrum

## 2017-05-26 NOTE — Discharge Instructions (Signed)
PATIENT INSTRUCTIONS POST-ANESTHESIA  IMMEDIATELY FOLLOWING SURGERY:  Do not drive or operate machinery for the first twenty four hours after surgery.  Do not make any important decisions for twenty four hours after surgery or while taking narcotic pain medications or sedatives.  If you develop intractable nausea and vomiting or a severe headache please notify your doctor immediately.  FOLLOW-UP:  Please make an appointment with your surgeon as instructed. You do not need to follow up with anesthesia unless specifically instructed to do so.  WOUND CARE INSTRUCTIONS (if applicable):  Keep a dry clean dressing on the anesthesia/puncture wound site if there is drainage.  Once the wound has quit draining you may leave it open to air.  Generally you should leave the bandage intact for twenty four hours unless there is drainage.  If the epidural site drains for more than 36-48 hours please call the anesthesia department.  QUESTIONS?:  Please feel free to call your physician or the hospital operator if you have any questions, and they will be happy to assist you.       Implanted Port Insertion, Care After This sheet gives you information about how to care for yourself after your procedure. Your health care provider may also give you more specific instructions. If you have problems or questions, contact your health care provider. What can I expect after the procedure? After your procedure, it is common to have:  Discomfort at the port insertion site.  Bruising on the skin over the port. This should improve over 3-4 days.  Follow these instructions at home: [facility] care  After your port is placed, you will get a manufacturer's information card. The card has information about your port. Keep this card with you at all times.  Take care of the port as told by your health care provider. Ask your health care provider if you or a family member can get training for taking care of the port at home. A home  health care nurse may also take care of the port.  Make sure to remember what type of port you have. Incision care  Follow instructions from your health care provider about how to take care of your port insertion site. Make sure you: ? Wash your hands with soap and water before you change your bandage (dressing). If soap and water are not available, use hand sanitizer. ? Change your dressing as told by your health care provider. ? Leave stitches (sutures), skin glue, or adhesive strips in place. These skin closures may need to stay in place for 2 weeks or longer. If adhesive strip edges start to loosen and curl up, you may trim the loose edges. Do not remove adhesive strips completely unless your health care provider tells you to do that.  Check your port insertion site every day for signs of infection. Check for: ? More redness, swelling, or pain. ? More fluid or blood. ? Warmth. ? Pus or a bad smell. General instructions  Do not take baths, swim, or use a hot tub until your health care provider approves.  Do not lift anything that is heavier than 10 lb (4.5 kg) for a week, or as told by your health care provider.  Ask your health care provider when it is okay to: ? Return to work or school. ? Resume usual physical activities or sports.  Do not drive for 24 hours if you were given a medicine to help you relax (sedative).  Take over-the-counter and prescription medicines only as  told by your health care provider.  Wear a medical alert bracelet in case of an emergency. This will tell any health care providers that you have a port.  Keep all follow-up visits as told by your health care provider. This is important. Contact a health care provider if:  You cannot flush your port with saline as directed, or you cannot draw blood from the port.  You have a fever or chills.  You have more redness, swelling, or pain around your port insertion site.  You have more fluid or blood coming  from your port insertion site.  Your port insertion site feels warm to the touch.  You have pus or a bad smell coming from the port insertion site. Get help right away if:  You have chest pain or shortness of breath.  You have bleeding from your port that you cannot control. Summary  Take care of the port as told by your health care provider.  Change your dressing as told by your health care provider.  Keep all follow-up visits as told by your health care provider. This information is not intended to replace advice given to you by your health care provider. Make sure you discuss any questions you have with your health care provider. Document Released: 09/18/2013 Document Revised: 10/19/2016 Document Reviewed: 10/19/2016 Elsevier Interactive Patient Education  2017 [employer].

## 2017-05-26 NOTE — Anesthesia Postprocedure Evaluation (Signed)
Anesthesia Post Note  Patient: Renee Jackson  Procedure(s) Performed: Procedure(s) (LRB): REMOVAL PORT-A-CATH (Left) INSERTION PORT-A-CATH (Right)  Patient location during evaluation: PACU Anesthesia Type: MAC Level of consciousness: awake and alert and oriented Pain management: pain level controlled Vital Signs Assessment: post-procedure vital signs reviewed and stable Respiratory status: spontaneous breathing Cardiovascular status: stable Postop Assessment: no signs of nausea or vomiting Anesthetic complications: no     Last Vitals:  Vitals:   05/26/17 0720 05/26/17 0725  BP: (!) 151/119 (!) 163/107  Resp: 20 (!) 21  Temp:      Last Pain:  Vitals:   05/26/17 0637  TempSrc: Oral  PainSc: 7                  Daniel Johndrow A

## 2017-05-29 ENCOUNTER — Encounter (HOSPITAL_COMMUNITY): Payer: Self-pay

## 2017-05-29 ENCOUNTER — Encounter (HOSPITAL_BASED_OUTPATIENT_CLINIC_OR_DEPARTMENT_OTHER): Payer: Medicare Other

## 2017-05-29 VITALS — BP 140/91 | HR 85 | Temp 98.2°F | Resp 20 | Wt 220.6 lb

## 2017-05-29 DIAGNOSIS — Z5111 Encounter for antineoplastic chemotherapy: Secondary | ICD-10-CM | POA: Diagnosis present

## 2017-05-29 DIAGNOSIS — C259 Malignant neoplasm of pancreas, unspecified: Secondary | ICD-10-CM

## 2017-05-29 DIAGNOSIS — E876 Hypokalemia: Secondary | ICD-10-CM

## 2017-05-29 DIAGNOSIS — K869 Disease of pancreas, unspecified: Secondary | ICD-10-CM | POA: Diagnosis not present

## 2017-05-29 DIAGNOSIS — K8689 Other specified diseases of pancreas: Secondary | ICD-10-CM

## 2017-05-29 LAB — COMPREHENSIVE METABOLIC PANEL
ALK PHOS: 65 U/L (ref 38–126)
ALT: 16 U/L (ref 14–54)
AST: 39 U/L (ref 15–41)
Albumin: 2.7 g/dL — ABNORMAL LOW (ref 3.5–5.0)
Anion gap: 10 (ref 5–15)
BILIRUBIN TOTAL: 0.6 mg/dL (ref 0.3–1.2)
BUN: 8 mg/dL (ref 6–20)
CALCIUM: 7 mg/dL — AB (ref 8.9–10.3)
CO2: 29 mmol/L (ref 22–32)
CREATININE: 0.81 mg/dL (ref 0.44–1.00)
Chloride: 97 mmol/L — ABNORMAL LOW (ref 101–111)
Glucose, Bld: 164 mg/dL — ABNORMAL HIGH (ref 65–99)
Potassium: 3.2 mmol/L — ABNORMAL LOW (ref 3.5–5.1)
Sodium: 136 mmol/L (ref 135–145)
Total Protein: 5 g/dL — ABNORMAL LOW (ref 6.5–8.1)

## 2017-05-29 LAB — CBC WITH DIFFERENTIAL/PLATELET
Basophils Absolute: 0.1 10*3/uL (ref 0.0–0.1)
Basophils Relative: 1 %
Eosinophils Absolute: 0.2 10*3/uL (ref 0.0–0.7)
Eosinophils Relative: 2 %
HCT: 27.2 % — ABNORMAL LOW (ref 36.0–46.0)
Hemoglobin: 8.9 g/dL — ABNORMAL LOW (ref 12.0–15.0)
LYMPHS ABS: 1.6 10*3/uL (ref 0.7–4.0)
LYMPHS PCT: 18 %
MCH: 29.6 pg (ref 26.0–34.0)
MCHC: 32.7 g/dL (ref 30.0–36.0)
MCV: 90.4 fL (ref 78.0–100.0)
Monocytes Absolute: 1.3 10*3/uL — ABNORMAL HIGH (ref 0.1–1.0)
Monocytes Relative: 15 %
NEUTROS PCT: 64 %
Neutro Abs: 5.6 10*3/uL (ref 1.7–7.7)
Platelets: 370 10*3/uL (ref 150–400)
RBC: 3.01 MIL/uL — AB (ref 3.87–5.11)
RDW: 18.2 % — ABNORMAL HIGH (ref 11.5–15.5)
WBC: 8.8 10*3/uL (ref 4.0–10.5)

## 2017-05-29 MED ORDER — SODIUM CHLORIDE 0.9 % IV SOLN
Freq: Once | INTRAVENOUS | Status: AC
  Administered 2017-05-29: 12:00:00 via INTRAVENOUS

## 2017-05-29 MED ORDER — SODIUM CHLORIDE 0.9 % IV SOLN
1000.0000 mg/m2 | Freq: Once | INTRAVENOUS | Status: AC
  Administered 2017-05-29: 2090 mg via INTRAVENOUS
  Filled 2017-05-29: qty 54.97

## 2017-05-29 MED ORDER — HEPARIN SOD (PORK) LOCK FLUSH 100 UNIT/ML IV SOLN
500.0000 [IU] | Freq: Once | INTRAVENOUS | Status: AC | PRN
  Administered 2017-05-29: 500 [IU]
  Filled 2017-05-29 (×2): qty 5

## 2017-05-29 MED ORDER — POTASSIUM CHLORIDE CRYS ER 20 MEQ PO TBCR
40.0000 meq | EXTENDED_RELEASE_TABLET | Freq: Once | ORAL | Status: AC
  Administered 2017-05-29: 40 meq via ORAL
  Filled 2017-05-29: qty 2

## 2017-05-29 MED ORDER — SODIUM CHLORIDE 0.9% FLUSH
10.0000 mL | INTRAVENOUS | Status: DC | PRN
  Administered 2017-05-29: 10 mL
  Filled 2017-05-29: qty 10

## 2017-05-29 MED ORDER — PACLITAXEL PROTEIN-BOUND CHEMO INJECTION 100 MG
125.0000 mg/m2 | Freq: Once | INTRAVENOUS | Status: AC
  Administered 2017-05-29: 250 mg via INTRAVENOUS
  Filled 2017-05-29: qty 50

## 2017-05-29 MED ORDER — PROCHLORPERAZINE MALEATE 10 MG PO TABS
10.0000 mg | ORAL_TABLET | Freq: Once | ORAL | Status: AC
  Administered 2017-05-29: 10 mg via ORAL
  Filled 2017-05-29: qty 1

## 2017-05-29 NOTE — Progress Notes (Signed)
Labs reviewed with MD, proceed with treatment.   Chemotherapy done today per orders. Patient tolerated it well without problems. Vitals stable and discharged via wheelchair with family at her side. Follow up as scheduled.

## 2017-05-29 NOTE — Patient Instructions (Signed)
[facility] Discharge Instructions for Patients Receiving Chemotherapy   Beginning January 23rd 2017 lab work for the Cancer Center will be done in the  Main lab at [facility] on 1st floor. If you have a lab appointment with the Cancer Center please come in thru the  Main Entrance and check in at the main information desk   Today you received the following chemotherapy agents   To help prevent nausea and vomiting after your treatment, we encourage you to take your nausea medication     If you develop nausea and vomiting, or diarrhea that is not controlled by your medication, call the clinic.  The clinic phone number is [phone]. Office hours are Monday-Friday 8:30am-5:00pm.  BELOW ARE SYMPTOMS THAT SHOULD BE REPORTED IMMEDIATELY:  *FEVER GREATER THAN 101.0 F  *CHILLS WITH OR WITHOUT FEVER  NAUSEA AND VOMITING THAT IS NOT CONTROLLED WITH YOUR NAUSEA MEDICATION  *UNUSUAL SHORTNESS OF BREATH  *UNUSUAL BRUISING OR BLEEDING  TENDERNESS IN MOUTH AND THROAT WITH OR WITHOUT PRESENCE OF ULCERS  *URINARY PROBLEMS  *BOWEL PROBLEMS  UNUSUAL RASH Items with * indicate a potential emergency and should be followed up as soon as possible. If you have an emergency after office hours please contact your primary care physician or go to the nearest emergency department.  Please call the clinic during office hours if you have any questions or concerns.   You may also contact the Patient Navigator at [phone] should you have any questions or need assistance in obtaining follow up care.      Resources For Cancer Patients and their Caregivers ? American Cancer Society: Can assist with transportation, wigs, general needs, runs Look Good Feel Better.        1-888-227-6333 ? Cancer Care: Provides financial assistance, online support groups, medication/co-pay assistance.  1-800-813-HOPE (4673) ? [facility] Assists [place] cancer  patients and their families through emotional , educational and financial support.  336-427-4357 ? [place] DSS Where to apply for food stamps, Medicaid and utility assistance. 336-342-1394 ? [facility]: Transportation to medical appointments. 336-347-2287 ? Social Security Administration: May apply for disability if have a Stage IV cancer. 336-342-7796 1-800-772-1213 ? [place] [facility]ging, Disability and Transit Services: Assists with nutrition, care and transit needs. 336-349-2343         

## 2017-05-30 ENCOUNTER — Other Ambulatory Visit (HOSPITAL_COMMUNITY): Payer: Self-pay | Admitting: Adult Health

## 2017-05-30 DIAGNOSIS — E876 Hypokalemia: Secondary | ICD-10-CM

## 2017-05-30 DIAGNOSIS — C259 Malignant neoplasm of pancreas, unspecified: Secondary | ICD-10-CM

## 2017-05-30 LAB — CANCER ANTIGEN 19-9: CA 19 9: 42 U/mL — AB (ref 0–35)

## 2017-05-30 MED ORDER — POTASSIUM CHLORIDE CRYS ER 20 MEQ PO TBCR: 20.0000 meq | EXTENDED_RELEASE_TABLET | Freq: Two times a day (BID) | ORAL | 3 refills | Status: AC

## 2017-06-01 ENCOUNTER — Encounter: Payer: Self-pay | Admitting: General Surgery

## 2017-06-01 ENCOUNTER — Ambulatory Visit (INDEPENDENT_AMBULATORY_CARE_PROVIDER_SITE_OTHER): Payer: Self-pay | Admitting: General Surgery

## 2017-06-01 VITALS — BP 142/58 | HR 109 | Temp 96.6°F | Resp 18 | Ht 62.0 in | Wt 218.0 lb

## 2017-06-01 DIAGNOSIS — Z09 Encounter for follow-up examination after completed treatment for conditions other than malignant neoplasm: Secondary | ICD-10-CM

## 2017-06-01 NOTE — Progress Notes (Signed)
Subjective:     Renee Jackson    Status post removal of left-sided Port-A-Cath, placement of right-sided Port-A-Cath.  Patient doing well. Objective:    BP (!) 142/58   Pulse (!) 109   Temp (!) 96.6 F (35.9 C)   Resp 18   Ht 5\' 2"  (1.575 m)   Wt 218 lb (98.9 kg)   BMI 39.87 kg/m   General:  alert, cooperative and no distress      Both incisions healing well.     Assessment:    Doing well postoperatively.    Plan:   follow-up as needed.

## 2017-06-05 ENCOUNTER — Encounter (HOSPITAL_COMMUNITY): Payer: Self-pay

## 2017-06-05 ENCOUNTER — Encounter (HOSPITAL_COMMUNITY): Payer: Medicare Other

## 2017-06-05 DIAGNOSIS — K8689 Other specified diseases of pancreas: Secondary | ICD-10-CM

## 2017-06-05 DIAGNOSIS — C259 Malignant neoplasm of pancreas, unspecified: Secondary | ICD-10-CM

## 2017-06-05 DIAGNOSIS — K869 Disease of pancreas, unspecified: Secondary | ICD-10-CM | POA: Diagnosis not present

## 2017-06-05 LAB — COMPREHENSIVE METABOLIC PANEL
ALBUMIN: 2.5 g/dL — AB (ref 3.5–5.0)
ALT: 17 U/L (ref 14–54)
ANION GAP: 10 (ref 5–15)
AST: 43 U/L — AB (ref 15–41)
Alkaline Phosphatase: 68 U/L (ref 38–126)
BUN: 17 mg/dL (ref 6–20)
CO2: 27 mmol/L (ref 22–32)
Calcium: 6.5 mg/dL — ABNORMAL LOW (ref 8.9–10.3)
Chloride: 92 mmol/L — ABNORMAL LOW (ref 101–111)
Creatinine, Ser: 1.61 mg/dL — ABNORMAL HIGH (ref 0.44–1.00)
GFR calc Af Amer: 37 mL/min — ABNORMAL LOW (ref >60)
GFR calc non Af Amer: 32 mL/min — ABNORMAL LOW (ref >60)
GLUCOSE: 89 mg/dL (ref 65–99)
POTASSIUM: 3.9 mmol/L (ref 3.5–5.1)
SODIUM: 129 mmol/L — AB (ref 135–145)
Total Bilirubin: 0.4 mg/dL (ref 0.3–1.2)
Total Protein: 5.1 g/dL — ABNORMAL LOW (ref 6.5–8.1)

## 2017-06-05 LAB — CBC WITH DIFFERENTIAL/PLATELET
BASOS PCT: 1 %
Basophils Absolute: 0 10*3/uL (ref 0.0–0.1)
EOS ABS: 0.1 10*3/uL (ref 0.0–0.7)
EOS PCT: 3 %
HCT: 22.6 % — ABNORMAL LOW (ref 36.0–46.0)
HEMOGLOBIN: 7.7 g/dL — AB (ref 12.0–15.0)
LYMPHS ABS: 0.9 10*3/uL (ref 0.7–4.0)
Lymphocytes Relative: 21 %
MCH: 30 pg (ref 26.0–34.0)
MCHC: 34.1 g/dL (ref 30.0–36.0)
MCV: 87.9 fL (ref 78.0–100.0)
Monocytes Absolute: 0.5 10*3/uL (ref 0.1–1.0)
Monocytes Relative: 11 %
NEUTROS PCT: 64 %
Neutro Abs: 2.8 10*3/uL (ref 1.7–7.7)
PLATELETS: 215 10*3/uL (ref 150–400)
RBC: 2.57 MIL/uL — AB (ref 3.87–5.11)
RDW: 18.5 % — ABNORMAL HIGH (ref 11.5–15.5)
WBC: 4.4 10*3/uL (ref 4.0–10.5)

## 2017-06-05 MED ORDER — HEPARIN SOD (PORK) LOCK FLUSH 100 UNIT/ML IV SOLN
500.0000 [IU] | Freq: Once | INTRAVENOUS | Status: AC
  Administered 2017-06-05: 500 [IU] via INTRAVENOUS

## 2017-06-05 MED ORDER — HEPARIN SOD (PORK) LOCK FLUSH 100 UNIT/ML IV SOLN
INTRAVENOUS | Status: AC
  Filled 2017-06-05: qty 5

## 2017-06-05 MED ORDER — PROMETHAZINE HCL 25 MG PO TABS: 25.0000 mg | ORAL_TABLET | Freq: Four times a day (QID) | ORAL | 1 refills | Status: AC | PRN

## 2017-06-05 MED ORDER — PROMETHAZINE HCL 25 MG PO TABS: 25.0000 mg | ORAL_TABLET | Freq: Four times a day (QID) | ORAL | 1 refills | Status: DC | PRN

## 2017-06-05 MED ORDER — MORPHINE SULFATE ER 15 MG PO TBCR: 15.0000 mg | EXTENDED_RELEASE_TABLET | Freq: Two times a day (BID) | ORAL | 0 refills | Status: DC

## 2017-06-05 NOTE — Progress Notes (Signed)
Labs reviewed with Dr. Talbert Cage - per MD, will defer tx x 1 week.  No new orders rec'd.  Although pt denies pain today, daughter states that she has been having to "double up" on her hydrocodone to control pain.  MD notified and Rx for MS Contin given - directed to take every twelve hours and to use hydrocodone for breakthrough pain. Pt discharged ambulatory in c/o daughter for transport home.

## 2017-06-07 ENCOUNTER — Other Ambulatory Visit (HOSPITAL_COMMUNITY): Payer: Self-pay | Admitting: Adult Health

## 2017-06-07 ENCOUNTER — Ambulatory Visit (HOSPITAL_COMMUNITY): Payer: Medicare Other

## 2017-06-07 ENCOUNTER — Encounter (HOSPITAL_COMMUNITY): Payer: Self-pay | Admitting: Adult Health

## 2017-06-07 DIAGNOSIS — C259 Malignant neoplasm of pancreas, unspecified: Secondary | ICD-10-CM

## 2017-06-07 MED ORDER — LORAZEPAM 0.5 MG PO TABS: 0.5000 mg | ORAL_TABLET | Freq: Four times a day (QID) | ORAL | 0 refills | Status: DC | PRN

## 2017-06-07 NOTE — Progress Notes (Signed)
Received refill request from [facility] for Lorazepam.    [place] Controlled Substance Reporting System reviewed and patient did not get previous prescription filled.  New prescription printed and to be faxed to [facility] based on refill request, as previous prescription provided by Dr. Talbert Cage may now be outdated.   Mike Craze, NP [place] [phone]

## 2017-06-12 ENCOUNTER — Encounter (HOSPITAL_COMMUNITY): Payer: Medicare Other | Attending: Oncology

## 2017-06-12 ENCOUNTER — Encounter (HOSPITAL_COMMUNITY): Payer: Self-pay

## 2017-06-12 VITALS — BP 170/91 | HR 88 | Temp 98.4°F | Resp 18 | Wt 218.0 lb

## 2017-06-12 DIAGNOSIS — Z5111 Encounter for antineoplastic chemotherapy: Secondary | ICD-10-CM

## 2017-06-12 DIAGNOSIS — C259 Malignant neoplasm of pancreas, unspecified: Secondary | ICD-10-CM

## 2017-06-12 DIAGNOSIS — K8689 Other specified diseases of pancreas: Secondary | ICD-10-CM

## 2017-06-12 DIAGNOSIS — K869 Disease of pancreas, unspecified: Secondary | ICD-10-CM | POA: Diagnosis present

## 2017-06-12 LAB — COMPREHENSIVE METABOLIC PANEL
ALBUMIN: 2.8 g/dL — AB (ref 3.5–5.0)
ALT: 12 U/L — AB (ref 14–54)
ANION GAP: 11 (ref 5–15)
AST: 24 U/L (ref 15–41)
Alkaline Phosphatase: 63 U/L (ref 38–126)
BILIRUBIN TOTAL: 0.8 mg/dL (ref 0.3–1.2)
BUN: 13 mg/dL (ref 6–20)
CALCIUM: 7.3 mg/dL — AB (ref 8.9–10.3)
CO2: 28 mmol/L (ref 22–32)
Chloride: 98 mmol/L — ABNORMAL LOW (ref 101–111)
Creatinine, Ser: 0.96 mg/dL (ref 0.44–1.00)
GFR calc Af Amer: >60 mL/min (ref >60)
GFR, EST NON AFRICAN AMERICAN: 59 mL/min — AB (ref >60)
Glucose, Bld: 126 mg/dL — ABNORMAL HIGH (ref 65–99)
POTASSIUM: 3.7 mmol/L (ref 3.5–5.1)
Sodium: 137 mmol/L (ref 135–145)
Total Protein: 5.3 g/dL — ABNORMAL LOW (ref 6.5–8.1)

## 2017-06-12 LAB — CBC WITH DIFFERENTIAL/PLATELET
Basophils Absolute: 0.1 10*3/uL (ref 0.0–0.1)
Basophils Relative: 1 %
EOS PCT: 7 %
Eosinophils Absolute: 0.5 10*3/uL (ref 0.0–0.7)
HEMATOCRIT: 28.9 % — AB (ref 36.0–46.0)
Hemoglobin: 9.1 g/dL — ABNORMAL LOW (ref 12.0–15.0)
LYMPHS PCT: 19 %
Lymphs Abs: 1.3 10*3/uL (ref 0.7–4.0)
MCH: 29.9 pg (ref 26.0–34.0)
MCHC: 31.5 g/dL (ref 30.0–36.0)
MCV: 95.1 fL (ref 78.0–100.0)
MONOS PCT: 11 %
Monocytes Absolute: 0.8 10*3/uL (ref 0.1–1.0)
NEUTROS PCT: 62 %
Neutro Abs: 4.2 10*3/uL (ref 1.7–7.7)
Platelets: 259 10*3/uL (ref 150–400)
RBC: 3.04 MIL/uL — AB (ref 3.87–5.11)
RDW: 22.4 % — ABNORMAL HIGH (ref 11.5–15.5)
WBC: 6.9 10*3/uL (ref 4.0–10.5)

## 2017-06-12 MED ORDER — PACLITAXEL PROTEIN-BOUND CHEMO INJECTION 100 MG
125.0000 mg/m2 | Freq: Once | INTRAVENOUS | Status: AC
  Administered 2017-06-12: 250 mg via INTRAVENOUS
  Filled 2017-06-12: qty 50

## 2017-06-12 MED ORDER — HEPARIN SOD (PORK) LOCK FLUSH 100 UNIT/ML IV SOLN
500.0000 [IU] | Freq: Once | INTRAVENOUS | Status: AC | PRN
  Administered 2017-06-12: 500 [IU]
  Filled 2017-06-12: qty 5

## 2017-06-12 MED ORDER — SODIUM CHLORIDE 0.9 % IV SOLN
Freq: Once | INTRAVENOUS | Status: AC
  Administered 2017-06-12: 12:00:00 via INTRAVENOUS

## 2017-06-12 MED ORDER — PROCHLORPERAZINE MALEATE 10 MG PO TABS
10.0000 mg | ORAL_TABLET | Freq: Once | ORAL | Status: AC
Start: 2017-06-12 — End: 2017-06-12
  Administered 2017-06-12: 10 mg via ORAL
  Filled 2017-06-12: qty 1

## 2017-06-12 MED ORDER — GEMCITABINE HCL CHEMO INJECTION 1 GM/26.3ML
1000.0000 mg/m2 | Freq: Once | INTRAVENOUS | Status: AC
  Administered 2017-06-12: 2090 mg via INTRAVENOUS
  Filled 2017-06-12: qty 54.97

## 2017-06-12 NOTE — Progress Notes (Signed)
Tolerated infusions w/o adverse reaction.  Alert, in no distress.  VSS.  Discharged via wheelchair in c/o family.  

## 2017-06-20 ENCOUNTER — Ambulatory Visit (HOSPITAL_COMMUNITY)
Admission: RE | Admit: 2017-06-20 | Discharge: 2017-06-20 | Disposition: A | Discharge status: Home / Self Care | Payer: Medicare Other | Source: Ambulatory Visit | Attending: Adult Health | Admitting: Adult Health

## 2017-06-20 DIAGNOSIS — C259 Malignant neoplasm of pancreas, unspecified: Secondary | ICD-10-CM

## 2017-06-21 ENCOUNTER — Encounter (HOSPITAL_COMMUNITY): Payer: Self-pay | Admitting: Lab

## 2017-06-21 ENCOUNTER — Encounter (HOSPITAL_COMMUNITY): Payer: Self-pay

## 2017-06-21 ENCOUNTER — Encounter (HOSPITAL_BASED_OUTPATIENT_CLINIC_OR_DEPARTMENT_OTHER): Payer: Medicare Other | Admitting: Oncology

## 2017-06-21 ENCOUNTER — Ambulatory Visit (HOSPITAL_COMMUNITY)
Admission: RE | Admit: 2017-06-21 | Discharge: 2017-06-21 | Disposition: A | Discharge status: Home / Self Care | Payer: Medicare Other | Source: Ambulatory Visit | Attending: Adult Health | Admitting: Adult Health

## 2017-06-21 VITALS — BP 164/54 | HR 94 | Temp 98.6°F | Resp 18 | Ht 62.0 in

## 2017-06-21 DIAGNOSIS — R609 Edema, unspecified: Secondary | ICD-10-CM | POA: Diagnosis not present

## 2017-06-21 DIAGNOSIS — C259 Malignant neoplasm of pancreas, unspecified: Secondary | ICD-10-CM | POA: Insufficient documentation

## 2017-06-21 DIAGNOSIS — R160 Hepatomegaly, not elsewhere classified: Secondary | ICD-10-CM | POA: Diagnosis not present

## 2017-06-21 DIAGNOSIS — I2510 Atherosclerotic heart disease of native coronary artery without angina pectoris: Secondary | ICD-10-CM | POA: Diagnosis not present

## 2017-06-21 DIAGNOSIS — I700 Atherosclerosis of aorta: Secondary | ICD-10-CM | POA: Diagnosis not present

## 2017-06-21 DIAGNOSIS — R1013 Epigastric pain: Secondary | ICD-10-CM

## 2017-06-21 DIAGNOSIS — I509 Heart failure, unspecified: Secondary | ICD-10-CM | POA: Diagnosis not present

## 2017-06-21 MED ORDER — FUROSEMIDE 20 MG PO TABS: 40.0000 mg | ORAL_TABLET | Freq: Every day | ORAL | 1 refills | Status: DC | PRN

## 2017-06-21 MED ORDER — IOPAMIDOL (ISOVUE-300) INJECTION 61%
100.0000 mL | Freq: Once | INTRAVENOUS | Status: AC | PRN
  Administered 2017-06-21: 100 mL via INTRAVENOUS

## 2017-06-21 NOTE — Progress Notes (Unsigned)
Referral sent to Dr Crisoforo Oxford at [facility]/  appt 7/31 @1030  Patient aware of appt.  She will be put on wait list

## 2017-06-22 ENCOUNTER — Other Ambulatory Visit (HOSPITAL_COMMUNITY): Payer: Self-pay | Admitting: *Deleted

## 2017-06-22 MED ORDER — FUROSEMIDE 20 MG PO TABS: 40.0000 mg | ORAL_TABLET | Freq: Every day | ORAL | 1 refills | Status: AC | PRN

## 2017-06-22 NOTE — Progress Notes (Signed)
[facility] Hematology/Oncology Progress Note   Name: Renee Jackson      MRN: [id number]    Location: Room/bed info not found  Date: 06/22/2017 Time:8:44 AM   REFERRING PHYSICIAN:  Gar Ponto, MD (Primary Care Provider)  REASON FOR CONSULT:  Pancreatic Cancer   DIAGNOSIS: Pancreatic Adenocarcinoma    Malignant neoplasm of pancreas ([place])   03/21/2017 Tumor Marker    Patient's tumor was tested for the following markers: CA 19-9 Results of the tumor marker test revealed 301.      03/27/2017 PET scan    Relatively low-level hypermetabolism corresponding to the pancreatic head mass. This measures on the order of 3.8 cm and a S.U.V. max of 4.4 on image 107/series 4. Re- demonstration of pancreatic body and tail atrophy with duct dilatation.  No other hypermetabolism within abdominopelvic nodes or parenchyma. Abdominopelvic findings deferred to prior diagnostic CT. Cholelithiasis. Abdominal aortic atherosclerosis. Fat containing ventral abdominal wall laxity, status post hernia repair.      03/30/2017 Initial Diagnosis    Pancreatic adenocarcinoma ([place])      03/30/2017 Procedure    EUS with FNA of pancreatic mass performed, path positive for adenocarcinoma.      04/26/2017 -  Neo-Adjuvant Chemotherapy    Gemzar/Abraxane days 1, 8, & 15 every 28 days       06/21/2017 PET scan    IMPRESSION: 1. Given cross modality comparison, similar size of a pancreatic head mass since 03/17/2017. 2. Tumor contact with the splenoportal confluence and superior mesenteric vein, as above. No arterial encasement. 3. Coronary artery atherosclerosis. Aortic Atherosclerosis (ICD10-I70.0). 4. Mild congestive heart failure with bilateral pleural effusions. 5. Hepatomegaly. 6. Probable cholelithiasis.      HISTORY OF PRESENT ILLNESS:   Renee Jackson is a 71 y.o. female returns for follow up of a pancreatic mass suspicious for cancer.  Patient reports that she  presented to her primary care physician with epigastric discomfort.  It was presumed that she had issues with her gallbladder.  She was treated symptomatically with dietary restrictions.  Her epigastric pain continued and symptoms progressed to include nausea and vomiting with all foods.  This led her to the emergency department in April 2018.  In the emergency department, ultrasound was performed which led to further imaging including MR abdomen.  MRI of the abdomen demonstrated a hypoenhancing mass in the pancreatic body and head measuring 3.9 x 2.1 x 3.5 cm extending to the posterior medial pancreatic margin and abutting the edge of the portal vein and SMV without obvious invasion or wrapping around of the structures.  The common bile duct skirts the right lateral margin of the mass is not definitely encased by the mass.  There is no common bile duct dilatation.  No definite venous invasion of the mass nor wrapping of the venous structures by the mass identified. 03/30/17: EUS with FNA of pancreatic mass performed, path positive for adenocarcinoma.' She reports saw Dr. Crisoforo Oxford for surgical evaluation on 5/1 and he recommended neoadjuvant chemotherapy prior to surgery.  Today patient presented for follow up. She states that since her last chemo she has felt more fatigued and weak. She complains of leg swelling on bilateral lower extremities. She states her appetite is fair. Continues to have intermittent epigastric pain which is controlled by her pain meds.     Review of Systems  Constitutional: Positive for malaise/fatigue and weight loss. Negative for chills and fever.  HENT: Negative.  Negative  for hearing loss, sore throat and tinnitus.   Eyes: Negative.  Negative for blurred vision, photophobia and discharge.  Respiratory: Negative.  Negative for cough, hemoptysis, shortness of breath and wheezing.   Cardiovascular: Positive for leg swelling. Negative for chest pain, palpitations, orthopnea and  claudication.  Gastrointestinal: Negative for abdominal pain, constipation, diarrhea, melena, nausea and vomiting.  Genitourinary: Negative.  Negative for dysuria and hematuria.  Musculoskeletal: Negative.  Negative for back pain, joint pain and myalgias.  Skin: Negative.  Negative for itching and rash.  Neurological: Positive for weakness. Negative for dizziness and headaches.  Endo/Heme/Allergies: Negative.  Negative for environmental allergies and polydipsia. Does not bruise/bleed easily.  Psychiatric/Behavioral: Negative.  Negative for depression. The patient is not nervous/anxious and does not have insomnia.   All other systems reviewed and are negative. 14 point review of systems was performed and is negative except as detailed under history of present illness and above  PAST MEDICAL HISTORY:   Past Medical History:  Diagnosis Date  . Arthritis    hands  . Asthma   . Atrial fibrillation ([place])   . Cancer [facility])    pancreatic  . Chronic lower back pain   . COPD (chronic obstructive pulmonary disease) ([place])   . Depression   . Diabetes ([place])    type 2  . Diabetic neuropathy ([facility])    feet and legs  . Dyspnea   . GERD (gastroesophageal reflux disease)   . Headache    migraines  . Hypercholesteremia   . Hypertension   . Neuropathy    both feet  . Pancreatic mass 03/21/2017  . Pancreatic mass   . Pneumonia 1995 severe case   2 times before that  . Seizures ([place]) as child  to age 6   with high fevers  . Sleep apnea    cpap uses sometimes  . Umbilical hernia     ALLERGIES: Allergies  Allergen Reactions  . Sulfa Antibiotics Itching      MEDICATIONS: I have reviewed the patient's current medications.    Current Outpatient Prescriptions on File Prior to Visit  Medication Sig Dispense Refill  . aspirin EC 81 MG tablet Take 81 mg by mouth daily.    Marland Kitchen atorvastatin (LIPITOR) 10 MG tablet Take 10 mg by mouth every morning.     . cetirizine (ZYRTEC) 10 MG tablet Take 10 mg  by mouth at bedtime.    . citalopram (CELEXA) 20 MG tablet Take 20 mg by mouth every morning.     . Diphenhyd-Hydrocort-Nystatin (FIRST-DUKES MOUTHWASH) SUSP Use as directed 5 mLs in the mouth or throat every 4 (four) hours as needed. 240 mL 3  . diphenoxylate-atropine (LOMOTIL) 2.5-0.025 MG tablet Take 1 tablet by mouth 4 (four) times daily as needed for diarrhea or loose stools. 90 tablet 1  . gabapentin (NEURONTIN) 300 MG capsule Take 300 mg by mouth 2 (two) times daily.  3  . Gemcitabine HCl (GEMZAR IV) Inject into the vein. Day 1, 8, 15 every 28 days    . glipiZIDE (GLUCOTROL XL) 10 MG 24 hr tablet Take 10 mg by mouth daily with breakfast.     . hydrochlorothiazide (HYDRODIURIL) 25 MG tablet Take 25 mg by mouth at bedtime.     Marland Kitchen HYDROcodone-acetaminophen (NORCO/VICODIN) 5-325 MG tablet Take 1 tablet by mouth every 4 (four) hours as needed for moderate pain or severe pain. 90 tablet 0  . lidocaine (XYLOCAINE) 2 % solution Use as directed 5 mLs in the mouth or  throat every 4 (four) hours as needed for mouth pain. Mixed with Duke's Magic Mouthwash    . lidocaine-prilocaine (EMLA) cream Apply to affected area once 30 g 3  . LORazepam (ATIVAN) 0.5 MG tablet Take 1 tablet (0.5 mg total) by mouth every 6 (six) hours as needed (Nausea or vomiting). 30 tablet 0  . losartan (COZAAR) 100 MG tablet Take 100 mg by mouth every morning.     . metFORMIN (GLUCOPHAGE) 1000 MG tablet Take 1,000 mg by mouth 2 (two) times daily with a meal.     . metoprolol succinate (TOPROL-XL) 50 MG 24 hr tablet Take 50 mg by mouth every morning.     . montelukast (SINGULAIR) 10 MG tablet Take 10 mg by mouth every morning.     Marland Kitchen morphine (MS CONTIN) 15 MG 12 hr tablet Take 1 tablet (15 mg total) by mouth every 12 (twelve) hours. 60 tablet 0  . Multiple Vitamins-Minerals (MULTIVITAMIN GUMMIES ADULT) CHEW Chew 2 tablets by mouth daily.    Marland Kitchen omeprazole (PRILOSEC) 20 MG capsule Take 40 mg by mouth every morning.     . ondansetron  (ZOFRAN) 8 MG tablet Take 1 tablet (8 mg total) by mouth 2 (two) times daily as needed (Nausea or vomiting). 30 tablet 1  . PACLitaxel Protein-Bound Part (ABRAXANE IV) Inject into the vein. Day 1, 8, 15 every 28 days    . potassium chloride SA (K-DUR,KLOR-CON) 20 MEQ tablet Take 1 tablet (20 mEq total) by mouth 2 (two) times daily. 60 tablet 3  . promethazine (PHENERGAN) 25 MG tablet Take 1 tablet (25 mg total) by mouth every 6 (six) hours as needed for nausea or vomiting. 30 tablet 1  . Simethicone (ALKA-SELTZER ANTI-GAS PO) Take 1 tablet by mouth daily as needed (gas).     No current facility-administered medications on file prior to visit.      PAST SURGICAL HISTORY Past Surgical History:  Procedure Laterality Date  . CESAREAN SECTION     x 1  . EUS N/A 03/30/2017   Procedure: UPPER ENDOSCOPIC ULTRASOUND (EUS) LINEAR;  Surgeon: Milus Banister, MD;  Location: [facility] END[facility];  Service: Endoscopy;  Laterality: N/A;  . HERNIA REPAIR     x 2 umbilical   . PORT-A-CATH REMOVAL Left 05/26/2017   Procedure: REMOVAL PORT-A-CATH;  Surgeon: Andres Labrum, MD;  Location: [facility] ORS;  Service: General;  Laterality: Left;  . PORTACATH PLACEMENT N/A 04/21/2017   Procedure: INSERTION PORT-A-CATH;  Surgeon: Aviva Signs, MD;  Location: [facility] ORS;  Service: General;  Laterality: N/A;  . PORTACATH PLACEMENT Right 05/26/2017   Procedure: INSERTION PORT-A-CATH;  Surgeon: Andres Labrum, MD;  Location: [facility] ORS;  Service: General;  Laterality: Right;  . UMBILICAL HERNIA REPAIR     multiple times    FAMILY HISTORY: Family History  Problem Relation Age of Onset  . Heart disease Mother   . Dementia Father    Mother deceased at the age of 45 secondary to complications of heart disease and diabetes. Father deceased at the age of 37 secondary to complications of dementia. She has 5 brothers, 4 of which are deceased. She has 3 sisters. She has a daughter 17 years old with multiple medical issues including  intracranial hypertension, neuropathy, back issues, and morbid obesity. She has 38 son is 35 years old in good health.  SOCIAL HISTORY:  reports that she quit smoking about 23 years ago. Her smoking use included Cigarettes. She has a 20.00 pack-year smoking history. She  has never used smokeless tobacco. She reports that she does not drink alcohol or use drugs.  She denies any illicit drug abuse.  She rarely drinks alcohol.  She does have a 1 pack per day 20 year smoking history quitting 22 years ago.  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  She is Psychologist, forensic and religion.  She is widowed.  Her husband passed away in 12/04/2016 secondary to stage IV pancreatic cancer.  He was not offered chemotherapy given his extent of disease.  Social History   Social History  . Marital status: Married    Spouse name: N/A  . Number of children: N/A  . Years of education: N/A   Social History Main Topics  . Smoking status: Former Smoker    Packs/day: 1.00    Years: 20.00    Types: Cigarettes    Quit date: 03/21/1994  . Smokeless tobacco: Never Used  . Alcohol use No  . Drug use: No  . Sexual activity: No   Other Topics Concern  . None   Social History Narrative  . None    PERFORMANCE STATUS: The patient's performance status is 1 - Symptomatic but completely ambulatory  PHYSICAL EXAM:  Vitals:   06/21/17 1018  BP: (!) 164/54  Pulse: 94  Resp: 18  Temp: 98.6 F (37 C)    Physical Exam  Constitutional: She is oriented to person, place, and time.  HENT:  Head: Normocephalic and atraumatic.  Eyes: Pupils are equal, round, and reactive to light. Conjunctivae and EOM are normal.  Neck: Normal range of motion. Neck supple.  Cardiovascular: Normal rate, regular rhythm and normal heart sounds.   Pulmonary/Chest: Effort normal and breath sounds normal.  Left chest wall chemoport C/D/I.  Abdominal: Soft. Bowel sounds are  normal.  Musculoskeletal: Normal range of motion.  2+ edema bilaterally  Neurological: She is alert and oriented to person, place, and time. Gait normal.  Skin: Skin is warm and dry. No rash noted.  Nursing note and vitals reviewed.  LABORATORY DATA:  CBC    Component Value Date/Time   WBC 6.9 06/12/2017 1049   RBC 3.04 (L) 06/12/2017 1049   HGB 9.1 (L) 06/12/2017 1049   HCT 28.9 (L) 06/12/2017 1049   PLT 259 06/12/2017 1049   MCV 95.1 06/12/2017 1049   MCH 29.9 06/12/2017 1049   MCHC 31.5 06/12/2017 1049   RDW 22.4 (H) 06/12/2017 1049   LYMPHSABS 1.3 06/12/2017 1049   MONOABS 0.8 06/12/2017 1049   EOSABS 0.5 06/12/2017 1049   BASOSABS 0.1 06/12/2017 1049     Chemistry      Component Value Date/Time   NA 137 06/12/2017 1049   K 3.7 06/12/2017 1049   CL 98 (L) 06/12/2017 1049   CO2 28 06/12/2017 1049   BUN 13 06/12/2017 1049   CREATININE 0.96 06/12/2017 1049      Component Value Date/Time   CALCIUM 7.3 (L) 06/12/2017 1049   ALKPHOS 63 06/12/2017 1049   AST 24 06/12/2017 1049   ALT 12 (L) 06/12/2017 1049   BILITOT 0.8 06/12/2017 1049      RADIOGRAPHY: I have personally reviewed the radiological images as listed and agreed with the findings in the report. CT ABDOMEN PELVIS W CONTRAST (Accession [id number]) (Order [id number])  Imaging  Date: 06/20/2017 Department: [facility] IM[facility] Released By: Brett Fairy Authorizing: Holley Bouche, NP  Exam Information  Status Exam Begun  Exam Ended   Final [99] 06/21/2017 8:43 AM 06/21/2017 9:02 AM  PACS Images   Show images for CT ABDOMEN PELVIS W CONTRAST  Study Result   CLINICAL DATA:  Pancreatic adenocarcinoma.  Restaging.  EXAM: CT CHEST, ABDOMEN, AND PELVIS WITH CONTRAST  TECHNIQUE: Multidetector CT imaging of the chest, abdomen and pelvis was performed following the standard protocol during bolus administration of intravenous contrast.  CONTRAST:  163mL ISOVUE-300 IOPAMIDOL (ISOVUE-300) INJECTION  61%  COMPARISON:  Chest radiograph 05/26/2017. PET 03/27/2017. Abdominal MRI of 03/17/2017. CT of the abdomen of 03/16/2017. Most recent chest CT 03/08/2010.  FINDINGS: CT CHEST FINDINGS  Cardiovascular: Aortic and branch vessel atherosclerosis. Mild cardiomegaly. Multivessel coronary artery atherosclerosis. Right-sided Port-A-Cath terminates at the high right atrium.  Mediastinum/Nodes: No supraclavicular adenopathy. No mediastinal or hilar adenopathy.  Lungs/Pleura: Trace bilateral pleural fluid. Smooth septal thickening is most apparent at the apices. Scattered areas of ground-glass opacity.  Musculoskeletal: No acute osseous abnormality.  CT ABDOMEN PELVIS FINDINGS  Hepatobiliary: Hepatomegaly at 19.3 cm craniocaudal. No focal liver lesion. Suspect small gallstones, without acute cholecystitis or biliary duct dilatation.  Pancreas: Pancreatic atrophy and upstream duct dilatation identified within the tail and body. Pancreatic head mass measures on the order of 4.1 x 3.1 cm on image 62/ series 2. Compare 4.0 x 3.0 cm on the prior MRI (when remeasured). No acute pancreatitis.  Spleen: Normal in size, without focal abnormality.  Adrenals/Urinary Tract: Normal adrenal glands. Normal kidneys, without hydronephrosis. Normal urinary bladder.  Stomach/Bowel: Tiny hiatal hernia. Normal distal stomach. Scattered colonic diverticula. Normal terminal ileum and appendix. Normal small bowel.  Vascular/Lymphatic: Abdominal aortic and branch vessel atherosclerosis.  Retroaortic left renal vein. There is contact with less than 180 degrees of the superior mesenteric vein on image 62/series 2. Anterior aspect of the splenoportal confluence is also contacted by tumor including on image 60/series 2. No narrowing. No arterially involvement of the celiac or superior mesenteric artery.  No abdominopelvic adenopathy.  Reproductive: Normal uterus and adnexa.  Other:  No significant free fluid. Moderate pelvic floor laxity. Ventral abdominal wall laxity.  Musculoskeletal: Trace L4-5 anterolisthesis is likely degenerative. Degenerate disc disease involves the lumbosacral junction.  IMPRESSION: 1. Given cross modality comparison, similar size of a pancreatic head mass since 03/17/2017. 2. Tumor contact with the splenoportal confluence and superior mesenteric vein, as above. No arterial encasement. 3. Coronary artery atherosclerosis. Aortic Atherosclerosis (ICD10-I70.0). 4. Mild congestive heart failure with bilateral pleural effusions. 5. Hepatomegaly. 6. Probable cholelithiasis.    03/30/2017    PET Scan 03/27/2017 IMPRESSION: 1. Relatively low-level hypermetabolism corresponding to the pancreatic head mass. 2. Given relatively low-level hypermetabolism within the primary, PET is likely of low sensitivity for metastatic disease. Given this mild limitation, no hypermetabolic metastatic disease identified. 3. Small hiatal hernia 4.  Coronary artery atherosclerosis. Aortic atherosclerosis. 5. Cholelithiasis.  PATHOLOGY:     ASSESSMENT/PLAN:  Stage IB Pancreatic Adenocarcinoma Cancer Staging Malignant neoplasm of pancreas [facility]) Staging form: Exocrine Pancreas, AJCC 8th Edition - Clinical stage from 04/06/2017: Stage IB (cT2, cN0, cM0) - Signed by Twana First, MD on 04/06/2017   PLAN: -Reviewed patient's PET scan with her in detail. She has not had a response to neoadjuvant chemotherapy, the pancreatic mass remains the same in size 4x3 cm without any arterial involvement still. Patient has not tolerated chemotherapy well. Therefore I have discussed with her going back to see Dr. Crisoforo Oxford and proceeding with Whipple surgery. -We will make a stat referral back to  Dr. Crisoforo Oxford at [facility]. -RTC in 4 weeks for follow up.   All questions were answered. The patient knows to call the clinic with any problems, questions or concerns. We can certainly see  the patient much sooner if necessary.   This note is electronically signed by: Twana First, MD 06/22/2017 8:44 AM

## 2017-06-26 ENCOUNTER — Ambulatory Visit (HOSPITAL_COMMUNITY): Payer: Medicare Other

## 2017-07-03 ENCOUNTER — Other Ambulatory Visit (HOSPITAL_COMMUNITY): Payer: Self-pay | Admitting: Emergency Medicine

## 2017-07-03 DIAGNOSIS — C259 Malignant neoplasm of pancreas, unspecified: Secondary | ICD-10-CM

## 2017-07-03 MED ORDER — MORPHINE SULFATE ER 15 MG PO TBCR: 15.0000 mg | EXTENDED_RELEASE_TABLET | Freq: Two times a day (BID) | ORAL | 0 refills | Status: AC

## 2017-07-03 MED ORDER — LORAZEPAM 0.5 MG PO TABS: 0.5000 mg | ORAL_TABLET | Freq: Four times a day (QID) | ORAL | 0 refills | Status: AC | PRN

## 2017-07-03 MED ORDER — HYDROCODONE-ACETAMINOPHEN 5-325 MG PO TABS: 1.0000 | ORAL_TABLET | ORAL | 0 refills | Status: AC | PRN

## 2017-07-03 NOTE — Progress Notes (Signed)
Morphine, hydrocodone, and ativan refilled.

## 2017-07-04 ENCOUNTER — Encounter (HOSPITAL_COMMUNITY): Payer: Self-pay | Admitting: Lab

## 2017-07-04 ENCOUNTER — Telehealth (HOSPITAL_COMMUNITY): Payer: Self-pay | Admitting: Oncology

## 2017-07-04 ENCOUNTER — Other Ambulatory Visit (HOSPITAL_COMMUNITY): Payer: Self-pay | Admitting: Oncology

## 2017-07-04 NOTE — Progress Notes (Unsigned)
Referral sent to [facility]. Records faxed on 7/24

## 2017-07-04 NOTE — Telephone Encounter (Signed)
Dr. Crisoforo Oxford called from [facility] about patient. Most recent imaging demonstrates that pancreatic mass abuts the SMV which would require vein resection at time of surgery.  This is a much more intense surgical management and therefore, Dr. Crisoforo Oxford wishes we consider further therapy with chemoXRT.  He admits that clinically the patient is better, but not the best surgical candidate given her performance status.  Of not, with treatment, her CA 19-9 has declined with treatment.  We may be getting a response that has not yet been identified radiographically.  If tumor shrinks, patient may be a candidate for whipple versus non-thermal ablation technique (IRE).    She is scheduled to come in on 8/9, we will move her appointment up to assist in facilitating her care and XRT referral.  Robynn Pane, PA-C 07/04/2017 12:33 PM

## 2017-07-06 ENCOUNTER — Ambulatory Visit (HOSPITAL_COMMUNITY): Payer: Medicare Other | Admitting: Oncology

## 2017-07-06 ENCOUNTER — Encounter (HOSPITAL_COMMUNITY): Payer: Self-pay | Admitting: Oncology

## 2017-07-06 NOTE — Progress Notes (Signed)
Patient rescheduled appointment.  ROS

## 2017-07-10 ENCOUNTER — Ambulatory Visit (HOSPITAL_COMMUNITY): Payer: Medicare Other

## 2017-07-10 ENCOUNTER — Encounter (HOSPITAL_BASED_OUTPATIENT_CLINIC_OR_DEPARTMENT_OTHER): Payer: Medicare Other | Admitting: Oncology

## 2017-07-10 DIAGNOSIS — C250 Malignant neoplasm of head of pancreas: Secondary | ICD-10-CM

## 2017-07-10 NOTE — Progress Notes (Signed)
DISCONTINUE OFF PATHWAY REGIMEN - Pancreatic     A cycle is every 28 days:     Nab-paclitaxel (protein bound)      Gemcitabine   **Always confirm dose/schedule in your pharmacy ordering system**    REASON: Continuation Of Treatment PRIOR TREATMENT: [id number]: Nab-Paclitaxel (Abraxane(R)) 125 mg/m2 D1, 8, 15 + Gemcitabine 1,000 mg/m2 D1, 8, 15 q28 Days x 2 Cycles TREATMENT RESPONSE: Stable Disease ([place])  START OFF PATHWAY REGIMEN - Pancreatic   OFF00212:5-Fluorouracil 225 mg/m2/d + XRT:   Duration of XRT:     5-Fluorouracil   **Always confirm dose/schedule in your pharmacy ordering system**    Patient Characteristics: Adenocarcinoma, Borderline Resectable or High Risk, Potentially Resectable, Primary Neoadjuvant Therapy, PS = 0, 1 Histology: Adenocarcinoma Current evidence of distant metastases? No AJCC T Category: T2 AJCC N Category: N0 AJCC M Category: M0 AJCC 8 Stage Grouping: IB Intent of Therapy: Curative Intent, Discussed with Patient

## 2017-07-10 NOTE — Assessment & Plan Note (Addendum)
Stage IB (cT2cN0M0) adenocarcinoma of pancreas diagnosed on 03/30/2017, S/P 2 cycles of Gemcitabine/Abraxane (04/26/2017- 06/12/2017) that was poorly tolerated without a significant radiographic response but with a biochemical response illustrated by a declining CA 19-9.  She was been seen by Dr. Crisoforo Oxford at [facility] following 2 cycles of chemotherapy with recommendations outlined below.  She saw Dr. Crisoforo Oxford on 07/03/2017.  I spoke with Dr. Crisoforo Oxford personally about his recommendations moving forward.  He notes that the mass abuts the SMV which would require vein resection at time of surgery and thereby a much more intense surgical procedure.  He admits that the patient clinically is much improved, but still a sub-optimal surgical candidate at this time.  He recommends chemoXRT in an attempt to improve response.  IF tumor shrinks, he would consider her again for a whipple versus non-thermal ablation (IRE).  I personally reviewed and went over radiographic studies with the patient.  The results are noted within this dictation.  I personally reviewed the images in PACS.  Referral to XRT for consideration of neoadjuvant radiation therapy.  Discussed chemotherapy as a radiosensitizing agent.  Given her poor tolerance to previous chemotherapy, we discussed 5FU CI as recommended treatment.  We discussed the risks, benefits, alternatives, and side effects of treatment, including but not limited to diarrhea, abdominal pain, stomatitis, severe GERD, palmar-plantar erythrodysesthesia, change in renal/liver function, death.  Treatment plan is built for 5FU at 225 mg/m2/day.  She will return following XRT consultation to plan for chemotherapy.  Labs for Day 1 cycle 1 ordered: CBC diff, CMET, CA 19-9.

## 2017-07-10 NOTE — Progress Notes (Signed)
Renee Bis, MD [place] [id number]  Malignant neoplasm of head of pancreas [facility]) - Plan: CANCELED: CBC with Differential, CANCELED: Comprehensive metabolic panel, CANCELED: Cancer antigen 19-9  CURRENT THERAPY: Discussion regarding future neoadjuvant therapy.  INTERVAL HISTORY: [name] 71 y.o. 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  She was been seen by Dr. Crisoforo Jackson at [facility] following 2 cycles of chemotherapy with recommendations outlined below.    Malignant neoplasm of pancreas ([place])   03/21/2017 Tumor Marker    Patient's tumor was tested for the following markers: CA 19-9 Results of the tumor marker test revealed 301.      03/21/2017 Tumor Marker    Patient's tumor was tested for the following markers: CA 19-9. Results of the tumor marker test revealed 301.      03/27/2017 PET scan    Relatively low-level hypermetabolism corresponding to the pancreatic head mass. This measures on the order of 3.8 cm and a S.U.V. max of 4.4 on image 107/series 4. Re- demonstration of pancreatic body and tail atrophy with duct dilatation.  No other hypermetabolism within abdominopelvic nodes or parenchyma. Abdominopelvic findings deferred to prior diagnostic CT. Cholelithiasis. Abdominal aortic atherosclerosis. Fat containing ventral abdominal wall laxity, status post hernia repair.      03/30/2017 Initial Diagnosis    Pancreatic adenocarcinoma ([facility])      03/30/2017 Procedure    EUS with FNA of pancreatic mass performed, path positive for adenocarcinoma.      04/26/2017 - 06/12/2017 Neo-Adjuvant Chemotherapy    Gemzar/Abraxane days 1, 8, & 15 every 28 days x 2 cycles      05/29/2017 Tumor Marker   Patient's tumor was tested for the following markers: CA 19-9. Results of the tumor marker test revealed 42.      06/21/2017 PET scan    IMPRESSION: 1. Given cross modality comparison, similar size of a pancreatic head mass since 03/17/2017. 2. Tumor contact with the splenoportal confluence and superior mesenteric vein, as above. No arterial encasement. 3. Coronary artery atherosclerosis. Aortic Atherosclerosis (ICD10-I70.0). 4. Mild congestive heart failure with bilateral pleural effusions. 5. Hepatomegaly. 6. Probable cholelithiasis.      07/03/2017 Miscellaneous    Re-consult with Dr. Jyl Heinz (Surg Onc) [facility]  Note is available for review in [place].  He has recommended chemoXRT in an attempt to drive response.  Tumor currently abuts SMV which would require vein resection.  If tumor shrinks with chemoXRT, may be a candidate for whipple or non-thermal ablation technique (IRE).        HPI Elements   Location: Head of Pancreas  Quality: Adenocarcinoma  Severity: Stage IB   Duration: Diagnosed on 03/30/2017   Context: Status post 2 cycles of neoadjuvant gemcitabine/Abraxane   Timing:   Modifying Factors: Neoadjuvant chemotherapy poorly tolerated   Associated Signs & Symptoms:     Review of Systems  Constitutional: Negative.  Negative for chills, fever and weight loss.  HENT: Negative.   Eyes: Negative.   Respiratory: Negative.  Negative for cough.   Cardiovascular: Negative.  Negative for chest pain.  Gastrointestinal: Negative.  Negative for blood in stool, constipation, diarrhea, melena, nausea and vomiting.  Genitourinary: Negative.   Musculoskeletal: Negative.   Skin: Positive for itching. Negative for rash.  Neurological: Negative.  Negative for weakness.  Endo/Heme/Allergies: Negative.   Psychiatric/Behavioral: Negative.     Past Medical History:  Diagnosis Date  . Arthritis    hands  . Asthma   . Atrial fibrillation ([place])   . Cancer [facility])     pancreatic  . Chronic lower back pain   . COPD (chronic obstructive pulmonary disease) ([place])   . Depression   . Diabetes ([place])    type 2  . Diabetic neuropathy ([facility])    feet and legs  . Dyspnea   . GERD (gastroesophageal reflux disease)   . Headache    migraines  . Hypercholesteremia   . Hypertension   . Malignant neoplasm of pancreas ([place]) 04/06/2017  . Neuropathy    both feet  . Pancreatic mass 03/21/2017  . Pancreatic mass   . Pneumonia 1995 severe case   2 times before that  . Seizures ([place]) as child  to age 26   with high fevers  . Sleep apnea    cpap uses sometimes  . Umbilical hernia     Past Surgical History:  Procedure Laterality Date  . CESAREAN SECTION     x 1  . EUS N/A 03/30/2017   Procedure: UPPER ENDOSCOPIC ULTRASOUND (EUS) LINEAR;  Surgeon: Milus Banister, MD;  Location: [facility] ENDOSCOPY;  Service: Endoscopy;  Laterality: N/A;  . HERNIA REPAIR     x 2 umbilical   . PORT-A-CATH REMOVAL Left 05/26/2017   Procedure: REMOVAL PORT-A-CATH;  Surgeon: Andres Labrum, MD;  Location: [facility] ORS;  Service: General;  Laterality: Left;  . PORTACATH PLACEMENT N/A 04/21/2017   Procedure: INSERTION PORT-A-CATH;  Surgeon: Aviva Signs, MD;  Location: [facility] ORS;  Service: General;  Laterality: N/A;  . PORTACATH PLACEMENT Right 05/26/2017   Procedure: INSERTION PORT-A-CATH;  Surgeon: Andres Labrum, MD;  Location: [facility] ORS;  Service: General;  Laterality: Right;  . UMBILICAL HERNIA REPAIR     multiple times    Family History  Problem Relation Age of Onset  . Heart disease Mother   . Dementia Father     Social History   Social History  . Marital status: Married    Spouse name: N/A  . Number of children: N/A  . Years of education: N/A   Social History Main Topics  . Smoking status: Former Smoker    Packs/day: 1.00    Years: 20.00    Types: Cigarettes    Quit date: 03/21/1994  . Smokeless tobacco: Never Used  . Alcohol use No  . Drug use: No  . Sexual activity: No    Other Topics Concern  . Not on file   Social History Narrative  . No narrative on file     PHYSICAL EXAMINATION  ECOG PERFORMANCE STATUS: 2 - Symptomatic, <50% confined to bed  Vitals:   07/10/17 1033  BP: (!) 115/95  Pulse: 73  Resp: 18  Temp: 98.2 F (36.8 C)    GENERAL:alert, no distress, well nourished, well developed, comfortable, cooperative, obese, smiling and accompanied by family member SKIN: skin color, texture, turgor are normal, no rashes or significant lesions HEAD: Normocephalic, No masses, lesions, tenderness or abnormalities EYES: normal, EOMI, Conjunctiva are pink and non-injected EARS: External ears normal OROPHARYNX:lips, buccal mucosa, and tongue normal and mucous membranes are moist  NECK: supple, no adenopathy, trachea midline LYMPH:  not examined BREAST:not examined LUNGS: not examined HEART: not examined ABDOMEN: obese BACK: Back symmetric, no curvature. EXTREMITIES:less then 2 second capillary refill, no joint  deformities, effusion, or inflammation, no skin discoloration, no cyanosis  NEURO: no focal motor/sensory deficits   LABORATORY DATA: CBC    Component Value Date/Time   WBC 6.9 06/12/2017 1049   RBC 3.04 (L) 06/12/2017 1049   HGB 9.1 (L) 06/12/2017 1049   HCT 28.9 (L) 06/12/2017 1049   PLT 259 06/12/2017 1049   MCV 95.1 06/12/2017 1049   MCH 29.9 06/12/2017 1049   MCHC 31.5 06/12/2017 1049   RDW 22.4 (H) 06/12/2017 1049   LYMPHSABS 1.3 06/12/2017 1049   MONOABS 0.8 06/12/2017 1049   EOSABS 0.5 06/12/2017 1049   BASOSABS 0.1 06/12/2017 1049      Chemistry      Component Value Date/Time   NA 137 06/12/2017 1049   K 3.7 06/12/2017 1049   CL 98 (L) 06/12/2017 1049   CO2 28 06/12/2017 1049   BUN 13 06/12/2017 1049   CREATININE 0.96 06/12/2017 1049      Component Value Date/Time   CALCIUM 7.3 (L) 06/12/2017 1049   ALKPHOS 63 06/12/2017 1049   AST 24 06/12/2017 1049   ALT 12 (L) 06/12/2017 1049   BILITOT 0.8 06/12/2017  1049       PENDING LABS:   RADIOGRAPHIC STUDIES:  Ct Chest W Contrast  Result Date: 06/21/2017 CLINICAL DATA:  Pancreatic adenocarcinoma.  Restaging. EXAM: CT CHEST, ABDOMEN, AND PELVIS WITH CONTRAST TECHNIQUE: Multidetector CT imaging of the chest, abdomen and pelvis was performed following the standard protocol during bolus administration of intravenous contrast. CONTRAST:  142mL ISOVUE-300 IOPAMIDOL (ISOVUE-300) INJECTION 61% COMPARISON:  Chest radiograph 05/26/2017. PET 03/27/2017. Abdominal MRI of 03/17/2017. CT of the abdomen of 03/16/2017. Most recent chest CT 03/08/2010. FINDINGS: CT CHEST FINDINGS Cardiovascular: Aortic and branch vessel atherosclerosis. Mild cardiomegaly. Multivessel coronary artery atherosclerosis. Right-sided Port-A-Cath terminates at the high right atrium. Mediastinum/Nodes: No supraclavicular adenopathy. No mediastinal or hilar adenopathy. Lungs/Pleura: Trace bilateral pleural fluid. Smooth septal thickening is most apparent at the apices. Scattered areas of ground-glass opacity. Musculoskeletal: No acute osseous abnormality. CT ABDOMEN PELVIS FINDINGS Hepatobiliary: Hepatomegaly at 19.3 cm craniocaudal. No focal liver lesion. Suspect small gallstones, without acute cholecystitis or biliary duct dilatation. Pancreas: Pancreatic atrophy and upstream duct dilatation identified within the tail and body. Pancreatic head mass measures on the order of 4.1 x 3.1 cm on image 62/ series 2. Compare 4.0 x 3.0 cm on the prior MRI (when remeasured). No acute pancreatitis. Spleen: Normal in size, without focal abnormality. Adrenals/Urinary Tract: Normal adrenal glands. Normal kidneys, without hydronephrosis. Normal urinary bladder. Stomach/Bowel: Tiny hiatal hernia. Normal distal stomach. Scattered colonic diverticula. Normal terminal ileum and appendix. Normal small bowel. Vascular/Lymphatic: Abdominal aortic and branch vessel atherosclerosis. Retroaortic left renal vein. There is  contact with less than 180 degrees of the superior mesenteric vein on image 62/series 2. Anterior aspect of the splenoportal confluence is also contacted by tumor including on image 60/series 2. No narrowing. No arterially involvement of the celiac or superior mesenteric artery. No abdominopelvic adenopathy. Reproductive: Normal uterus and adnexa. Other: No significant free fluid. Moderate pelvic floor laxity. Ventral abdominal wall laxity. Musculoskeletal: Trace L4-5 anterolisthesis is likely degenerative. Degenerate disc disease involves the lumbosacral junction. IMPRESSION: 1. Given cross modality comparison, similar size of a pancreatic head mass since 03/17/2017. 2. Tumor contact with the splenoportal confluence and superior mesenteric vein, as above. No arterial encasement. 3. Coronary artery atherosclerosis. Aortic Atherosclerosis (ICD10-I70.0). 4. Mild congestive heart failure with bilateral pleural effusions. 5. Hepatomegaly. 6. Probable cholelithiasis. Electronically Signed   By: Marylyn Ishihara  Jobe Igo M.D.   On: 06/21/2017 09:29   Ct Abdomen Pelvis W Contrast  Result Date: 06/21/2017 CLINICAL DATA:  Pancreatic adenocarcinoma.  Restaging. EXAM: CT CHEST, ABDOMEN, AND PELVIS WITH CONTRAST TECHNIQUE: Multidetector CT imaging of the chest, abdomen and pelvis was performed following the standard protocol during bolus administration of intravenous contrast. CONTRAST:  1110mL ISOVUE-300 IOPAMIDOL (ISOVUE-300) INJECTION 61% COMPARISON:  Chest radiograph 05/26/2017. PET 03/27/2017. Abdominal MRI of 03/17/2017. CT of the abdomen of 03/16/2017. Most recent chest CT 03/08/2010. FINDINGS: CT CHEST FINDINGS Cardiovascular: Aortic and branch vessel atherosclerosis. Mild cardiomegaly. Multivessel coronary artery atherosclerosis. Right-sided Port-A-Cath terminates at the high right atrium. Mediastinum/Nodes: No supraclavicular adenopathy. No mediastinal or hilar adenopathy. Lungs/Pleura: Trace bilateral pleural fluid. Smooth  septal thickening is most apparent at the apices. Scattered areas of ground-glass opacity. Musculoskeletal: No acute osseous abnormality. CT ABDOMEN PELVIS FINDINGS Hepatobiliary: Hepatomegaly at 19.3 cm craniocaudal. No focal liver lesion. Suspect small gallstones, without acute cholecystitis or biliary duct dilatation. Pancreas: Pancreatic atrophy and upstream duct dilatation identified within the tail and body. Pancreatic head mass measures on the order of 4.1 x 3.1 cm on image 62/ series 2. Compare 4.0 x 3.0 cm on the prior MRI (when remeasured). No acute pancreatitis. Spleen: Normal in size, without focal abnormality. Adrenals/Urinary Tract: Normal adrenal glands. Normal kidneys, without hydronephrosis. Normal urinary bladder. Stomach/Bowel: Tiny hiatal hernia. Normal distal stomach. Scattered colonic diverticula. Normal terminal ileum and appendix. Normal small bowel. Vascular/Lymphatic: Abdominal aortic and branch vessel atherosclerosis. Retroaortic left renal vein. There is contact with less than 180 degrees of the superior mesenteric vein on image 62/series 2. Anterior aspect of the splenoportal confluence is also contacted by tumor including on image 60/series 2. No narrowing. No arterially involvement of the celiac or superior mesenteric artery. No abdominopelvic adenopathy. Reproductive: Normal uterus and adnexa. Other: No significant free fluid. Moderate pelvic floor laxity. Ventral abdominal wall laxity. Musculoskeletal: Trace L4-5 anterolisthesis is likely degenerative. Degenerate disc disease involves the lumbosacral junction. IMPRESSION: 1. Given cross modality comparison, similar size of a pancreatic head mass since 03/17/2017. 2. Tumor contact with the splenoportal confluence and superior mesenteric vein, as above. No arterial encasement. 3. Coronary artery atherosclerosis. Aortic Atherosclerosis (ICD10-I70.0). 4. Mild congestive heart failure with bilateral pleural effusions. 5. Hepatomegaly. 6.  Probable cholelithiasis. Electronically Signed   By: Abigail Miyamoto M.D.   On: 06/21/2017 09:29     PATHOLOGY:    ASSESSMENT AND PLAN:  Malignant neoplasm of pancreas ([place]) Stage IB (cT2cN0M0) adenocarcinoma of pancreas diagnosed on 03/30/2017, S/P 2 cycles of Gemcitabine/Abraxane (04/26/2017- 06/12/2017) that was poorly tolerated without a significant radiographic response but with a biochemical response illustrated by a declining CA 19-9.  She was been seen by Dr. Crisoforo Jackson at [facility] following 2 cycles of chemotherapy with recommendations outlined below.  She saw Dr. Crisoforo Jackson on 07/03/2017.  I spoke with Dr. Crisoforo Jackson personally about his recommendations moving forward.  He notes that the mass abuts the SMV which would require vein resection at time of surgery and thereby a much more intense surgical procedure.  He admits that the patient clinically is much improved, but still a sub-optimal surgical candidate at this time.  He recommends chemoXRT in an attempt to improve response.  IF tumor shrinks, he would consider her again for a whipple versus non-thermal ablation (IRE).  I personally reviewed and went over radiographic studies with the patient.  The results are noted within this dictation.  I personally reviewed the images in PACS.  Referral to XRT  for consideration of neoadjuvant radiation therapy.  Discussed chemotherapy as a radiosensitizing agent.  Given her poor tolerance to previous chemotherapy, we discussed 5FU CI as recommended treatment.  We discussed the risks, benefits, alternatives, and side effects of treatment, including but not limited to diarrhea, abdominal pain, stomatitis, severe GERD, palmar-plantar erythrodysesthesia, change in renal/liver function, death.  She will return following XRT consultation to plan for chemotherapy.  Labs for Day 1 cycle 1 ordered: CBC diff, CMET, CA 19-9.  ORDERS PLACED FOR THIS ENCOUNTER: No orders of the defined types were placed in this  encounter.   MEDICATIONS PRESCRIBED THIS ENCOUNTER: No orders of the defined types were placed in this encounter.   THERAPY PLAN:  ChemoXRT with 5FU CI.  All questions were answered. The patient knows to call the clinic with any problems, questions or concerns. We can certainly see the patient much sooner if necessary.  Patient and plan discussed with Dr. Twana First and she is in agreement with the aforementioned.   This note is electronically signed by: Robynn Pane, PA-C 07/10/2017 4:06 PM

## 2017-07-10 NOTE — Patient Instructions (Addendum)
[place] at [facility] Discharge Instructions  RECOMMENDATIONS MADE BY THE CONSULTANT AND ANY TEST RESULTS WILL BE SENT TO YOUR REFERRING PHYSICIAN.  Referring to radiation therapy in [place]. Return to start Chemo when radiation is ready.   Thank you for choosing [place] at [facility] to provide your oncology and hematology care.  To afford each patient quality time with our provider, please arrive at least 15 minutes before your scheduled appointment time.    If you have a lab appointment with the [place] please come in thru the  Main Entrance and check in at the main information desk  You need to re-schedule your appointment should you arrive 10 or more minutes late.  We strive to give you quality time with our providers, and arriving late affects you and other patients whose appointments are after yours.  Also, if you no show three or more times for appointments you may be dismissed from the clinic at the providers discretion.     Again, thank you for choosing [facility].  Our hope is that these requests will decrease the amount of time that you wait before being seen by our physicians.       _____________________________________________________________  Should you have questions after your visit to [facility], please contact our office at [phone] between the hours of 8:30 a.m. and 4:30 p.m.  Voicemails left after 4:30 p.m. will not be returned until the following business day.  For prescription refill requests, have your pharmacy contact our office.       Resources For Cancer Patients and their Caregivers ? American Cancer Society: Can assist with transportation, wigs, general needs, runs Look Good Feel Better.        (787)317-5179 ? Cancer Care: Provides financial assistance, online support groups, medication/co-pay assistance.  1-800-813-HOPE (223)777-8541) ? [place] Assists [place] cancer patients and their families through emotional , educational and financial support.  762-287-5716 ? [place] DSS Where to apply for food stamps, Medicaid and utility assistance. 778-147-6848 ? [facility]: Transportation to medical appointments. 618-020-5594 ? Social Security Administration: May apply for disability if have a Stage IV cancer. 629-814-3345 702-600-2438 ? [employer], Disability and Transit Services: Assists with nutrition, care and transit needs. [place] Support Programs: @10RELATIVEDAYS @ > Cancer Support Group  2nd Tuesday of the month 1pm-2pm, Journey Room  > Creative Journey  3rd Tuesday of the month 1130am-1pm, Journey Room  > Look Good Feel Better  1st Wednesday of the month 10am-12 noon, Journey Room (Call [place] to register [phone])

## 2017-07-13 ENCOUNTER — Encounter (HOSPITAL_COMMUNITY): Payer: Self-pay | Admitting: Emergency Medicine

## 2017-07-13 MED ORDER — ONDANSETRON HCL 8 MG PO TABS: 8.0000 mg | ORAL_TABLET | Freq: Two times a day (BID) | ORAL | 1 refills | Status: AC | PRN

## 2017-07-13 MED ORDER — PROCHLORPERAZINE MALEATE 10 MG PO TABS: 10.0000 mg | ORAL_TABLET | Freq: Four times a day (QID) | ORAL | 1 refills | Status: AC | PRN

## 2017-07-13 MED ORDER — LIDOCAINE-PRILOCAINE 2.5-2.5 % EX CREA: TOPICAL_CREAM | CUTANEOUS | 3 refills | Status: AC

## 2017-07-13 NOTE — Patient Instructions (Signed)
Renee Jackson   CHEMOTHERAPY INSTRUCTIONS  You have been diagnosed with Stage 1 Pancreatic Cancer.  You will have chemotherapy and radiation concurrently.  You will be treated with fluorouracil or 5FU.  This will be given to you continuously through a portable pump the whole time you are having radiation treatments for 6 weeks.  You will come in once a week to have lab work (takes about 30 minutes to 1 hour for lab work to be resulted) and to have the chemo and pump changed (there is a delay with pharmacy making your chemotherapy, you may have to wait).   You will see the doctor regularly throughout treatment.  This treatment is with curative intent.     POTENTIAL SIDE EFFECTS OF TREATMENT:  5-Fluorouracil (Adrucil)  About This Drug Fluorouracil is used to treat cancer. It is given in the vein (IV).  Possible Side Effects . Hair loss. Hair loss is often temporary, although with certain medicine, hair loss can sometimes be permanent. Hair loss may happen suddenly or gradually. If you lose hair, you may lose it from your head, face, armpits, pubic area, chest, and/or legs. You may also notice your hair getting thin. . Changes in your nail color, nail loss and/or brittle nail . Darkening of the skin, or changes to the color of your skin and/or veins used for infusion . Rash, itching . Nausea and throwing up (vomiting) . Loose bowel movements (diarrhea) . Ulcers - sores that may cause pain or bleeding in your digestive tract, which includes your mouth, esophagus, stomach, small/large intestines and rectum . Soreness of the mouth and throat. You may have red areas, white patches, or sores that hurt. . Decreased appetite (decreased hunger) . Changes in the tissue of the heart and/or heart attack. Some changes may happen that can cause your heart to have less ability to pump blood. . Bone marrow depression. This is a decrease in the number of white  blood cells, red blood cells, and platelets. This may raise your risk of infection, make you tired and weak (fatigue), and raise your risk of bleeding . Sensitivity to light (photosensitivity). Photosensitivity means that you may become more sensitive to the sun and/or light. Your eyes may water more, mostly in bright light. . Allergic reaction: Allergic reactions, including anaphylaxis are rare but may happen in some patients. Signs of allergic reaction to this drug may be swelling of the face, feeling like your tongue or throat are swelling, trouble breathing, rash, itching, fever, chills, feeling dizzy, and/or feeling that your heart is beating in a fast or not normal way. If this happens, do not take another dose of this drug. You should get urgent medical treatment. . Blurred vision or other changes in eyesight Note: Not all possible side effects are included above.  Warnings and Precautions . Hand-and-foot syndrome. The palms of your hands or soles of your feet may tingle, become numb, painful, swollen, or red. . Changes in your central nervous system can happen. The central nervous system is made up of your brain and spinal cord. You could feel extreme tiredness, agitation, confusion, hallucinations (see or hear things that are not there), trouble understanding or speaking, loss of control of your bowels or bladder, eyesight changes, numbness or lack of strength to your arms, legs, face, or body, and coma. If you start to have any of these symptoms let your doctor know right away. . Side effects of this drug may be unexpectedly  severe in some patients Note: Some of the side effects above are very rare. If you have concerns and/or questions, please discuss them with your medical team.  Important Information . This drug may be present in the saliva, tears, sweat, urine, stool, vomit, semen, and vaginal secretions. Talk to your doctor and/or your nurse about the necessary precautions  to take during this time.  Treating Side Effects . To help with hair loss, wash with a mild shampoo and avoid washing your hair every day. . Avoid rubbing your scalp, pat your hair or scalp dry. . Avoid coloring your hair. . Limit your use of hair spray, electric curlers, blow dryers, and curling irons. . If you are interested in getting a wig, talk to your nurse. You can also call the [facility] at [phone]-2345 to find out information about the "Look Good, Feel Better" program close to where you live. It is a free program where women getting chemotherapy can learn about wigs, turbans and scarves as well as makeup techniques and skin and nail care. Marland Kitchen Keeping your nails moisturized may help with brittleness. . To help with itching, moisturize your skin several times day. . Avoid sun exposure and apply sunscreen routinely when outdoors. . If you get a rash do not put anything on it unless your doctor or nurse says you may. Keep the area around the rash clean and dry. Ask your doctor for medicine if your rash bothers you. . To help with decreased appetite, eat small, frequent meals. . Eat high caloric food such as pudding, ice cream, yogurt and milkshakes. . Drink plenty of fluids (a minimum of eight glasses per day is recommended). . If you throw up or have loose bowel movements, you should drink more fluids so that you do not become dehydrated (lack water in the body from losing too much fluid). . To help with nausea and vomiting, eat small, frequent meals instead of three large meals a day. Choose foods and drinks that are at room temperature. Ask your nurse or doctor about other helpful tips and medicine that is available to help or stop lessen these symptoms. . If you get diarrhea, eat low-fiber foods that are high in protein and calories and avoid foods that can irritate your digestive tracts or lead to cramping. . Ask your nurse or doctor about medicine that can lessen or  stop your diarrhea. . Mouth care is very important. Your mouth care should consist of routine, gentle cleaning of your teeth or dentures and rinsing your mouth with a mixture of 1/2 teaspoon of salt in 8 ounces of water or  teaspoon of baking soda in 8 ounces of water. This should be done at least after each meal and at bedtime. . If you have mouth sores, avoid mouthwash that has alcohol. Also avoid alcohol and smoking because they can bother your mouth and throat. . Manage tiredness by pacing your activities for the day. . Be sure to include periods of rest between energy-draining activities. . To help decrease your risk of infections, wash your hands regularly. . Avoid close contact with people who have a cold, the flu, or other infections. . Use a soft toothbrush. Check with your nurse before using dental floss. . Be very careful when using knives or tools. . Use an electric shaver instead of a razor.  Food and Drug Interactions . There are no known interactions of fluorouracil with food. . Check with your doctor or pharmacist about all other  prescription medicines and dietary supplements you are taking before starting this medicine as there are a lot of known drug interactions with fluorouracil. Also, check with your doctor or pharmacist before starting any new prescription or over-the-counter medicines, or dietary supplement to make sure that there are no interactions.  When to Call the Doctor Call your doctor or nurse if you have any of these symptoms and/or any new or unusual symptoms: . Fever of 100.5 F (38 C) or higher . Chills . Easy bleeding or bruising . Trouble breathing . Feeling dizzy or lightheaded . Feeling that your heart is beating in a fast or not normal way (palpitations) . Chest pain or symptoms of a heart attack. Most heart attacks involve pain in the center of the chest that lasts more than a few minutes. The pain may go away and come back or it can be  constant. It can feel like pressure, squeezing, fullness, or pain. Sometimes pain is felt in one or both arms, the back, neck, jaw, or stomach. If any of these symptoms last 2 minutes, call 911. Marland Kitchen Confusion and/or agitation . Hallucinations . Trouble understanding or speaking . Blurry vision or changes in your eyesight . Numbness or lack of strength to your arms, legs, face, or body . Nausea that stops you from eating or drinking and/or is not relieved by prescribed medicines . Throwing up more than 3 times a day . Loose bowel movements (diarrhea) 4 times a day or loose bowel movements with lack of strength or a feeling of being dizzy . Lasting loss of appetite or rapid weight loss of five pounds in a week . Pain in your mouth or throat that makes it hard to eat or drink . Pain along the digestive tract - especially if worse after eating . Blood in your vomit (bright red or coffee-ground) and/or stools (bright red, or black/tarry) . Coughing up blood . Fatigue that interferes with your daily activities . Painful, red, or swollen areas on your hands or feet . Numbness and/or tingling of your hands and/or feet . Signs of allergic reaction: swelling of the face, feeling like your tongue or throat are swelling, trouble breathing, rash, itching, fever, chills, feeling dizzy, and/or feeling that your heart is beating in a fast or not normal way . If you think you are pregnant or may have impregnated your partner  Reproduction Warnings . Pregnancy warning: This drug may have harmful effects on the unborn baby. Women of child bearing potential should use effective methods of birth control during your cancer treatment. Let your doctor know right away if you think you may be pregnant. . Breastfeeding warning: It is not known if this drug passes into breast milk. For this reason, women should talk to their doctor about the risks and benefits of breast feeding during treatment with this drug  because this drug may enter the breast milk and cause harm to a breast feeding baby. . Fertility warning: In men and women both, this drug may affect your ability to have children in the future. Talk with your doctor or nurse if you plan to have children. Ask for information on sperm or egg banking.     SELF CARE ACTIVITIES WHILE ON CHEMOTHERAPY: Hydration Increase your fluid intake 48 hours prior to treatment and drink at least 8 to 12 cups (64 ounces) of water/decaff beverages per day after treatment. You can still have your cup of coffee or soda but these beverages do not count as  part of your 8 to 12 cups that you need to drink daily. No alcohol intake.  Medications Continue taking your normal prescription medication as prescribed.  If you start any new herbal or new supplements please let us know first to make sure it is safe.  Mouth Care Have teeth cleaned professionally before starting treatment. Keep dentures and partial plates clean. Use soft toothbrush and do not use mouthwashes that contain alcohol. Biotene is a good mouthwash that is available at most pharmacies or may be ordered by calling [phone]. Use warm salt water gargles (1 teaspoon salt per 1 quart warm water) before and after meals and at bedtime. Or you may rinse with 2 tablespoons of three-percent hydrogen peroxide mixed in eight ounces of water. If you are still having problems with your mouth or sores in your mouth please call the clinic. If you need dental work, please let the doctor know before you go for your appointment so that we can coordinate the best possible time for you in regards to your chemo regimen. You need to also let your dentist know that you are actively taking chemo. We may need to do labs prior to your dental appointment.   Skin Care Always use sunscreen that has not expired and with SPF (Sun Protection Factor) of 50 or higher. Wear hats to protect your head from the sun. Remember to use  sunscreen on your hands, ears, face, & feet.  Use good moisturizing lotions such as udder cream, eucerin, or even Vaseline. Some chemotherapies can cause dry skin, color changes in your skin and nails.     Avoid long, hot showers or baths.  Use gentle, fragrance-free soaps and laundry detergent.  Use moisturizers, preferably creams or ointments rather than lotions because the thicker consistency is better at preventing skin dehydration. Apply the cream or ointment within 15 minutes of showering. Reapply moisturizer at night, and moisturize your hands every time after you wash them.  Hair Loss (if your doctor says your hair will fall out)   If your doctor says that your hair is likely to fall out, decide before you begin chemo whether you want to wear a wig. You may want to shop before treatment to match your hair color.  Hats, turbans, and scarves can also camouflage hair loss, although some people prefer to leave their heads uncovered. If you go bare-headed outdoors, be sure to use sunscreen on your scalp.  Cut your hair short. It eases the inconvenience of shedding lots of hair, but it also can reduce the emotional impact of watching your hair fall out.  Don't perm or color your hair during chemotherapy. Those chemical treatments are already damaging to hair and can enhance hair loss. Once your chemo treatments are done and your hair has grown back, it's OK to resume dyeing or perming hair. With chemotherapy, hair loss is almost always temporary. But when it grows back, it may be a different color or texture. In older adults who still had hair color before chemotherapy, the new growth may be completely gray.  Often, new hair is very fine and soft.  Infection Prevention Please wash your hands for at least 30 seconds using warm soapy water. Handwashing is the #1 way to prevent the spread of germs. Stay away from sick people or people who are getting over a cold. If you develop respiratory  systems such as green/yellow mucus production or productive cough or persistent cough let us know and we will see if  you need an antibiotic. It is a good idea to keep a pair of gloves on when going into grocery stores/[employer] to decrease your risk of coming into contact with germs on the carts, etc. Carry alcohol hand gel with you at all times and use it frequently if out in public. If your temperature reaches 100.5 or higher please call the clinic and let us know.  If it is after hours or on the weekend please go to the ER if your temperature is over 100.5.  Please have your own personal thermometer at home to use.    Sex and bodily fluids If you are going to have sex, a condom must be used to protect the person that isn't taking chemotherapy. Chemo can decrease your libido (sex drive). For a few days after chemotherapy, chemotherapy can be excreted through your bodily fluids.  When using the toilet please close the lid and flush the toilet twice.  Do this for a few day after you have had chemotherapy.    Effects of chemotherapy on your sex life Some changes are simple and won't last long. They won't affect your sex life permanently. Sometimes you may feel:  too tired  not strong enough to be very active  sick or sore   not in the mood  anxious or low Your anxiety might not seem related to sex. For example, you may be worried about the cancer and how your treatment is going. Or you may be worried about money, or about how you family are coping with your illness. These things can cause stress, which can affect your interest in sex. It's important to talk to your partner about how you feel. Remember - the changes to your sex life don't usually last long. There's usually no medical reason to stop having sex during chemo. The drugs won't have any long term physical effects on your performance or enjoyment of sex. Cancer can't be passed on to your partner during sex  Contraception It's  important to use reliable contraception during treatment. Avoid getting pregnant while you or your partner are having chemotherapy. This isbecause the drugs may harm the baby. Sometimes chemotherapy drugs can leave a man or woman infertile.  This means you would not be able to have children in the future. You might want to talk to someone about permanent infertility. It can be very difficult to learn that you may no longer be able to have children. Some people find counselling helpful. There might be ways to preserve your fertility, although this is easier for men than for women. You may want to speak to a fertility expert. You can talk about sperm banking or harvesting your eggs. You can also ask about other fertility options, such as donor eggs. If you haveor have had breast cancer, your doctor might advise you not to take the contraceptive pill. This is because the hormones in it might affect the cancer. It is not known for sure whether or not chemotherapy drugs can be passed on through semen or secretions from the vagina. Because of this some doctors advise people to use a barrier method if you have sex during treatment. This applies to vaginal, anal or oral sex. Generally, doctors advise a barrier method only for the time you are actually having the treatment and for about a week after your treatment. Advice like this can be worrying, but this does not mean that you have to avoid being intimate with your partner. You can still have close  contact with your partner and continue to enjoy sex.  Animals If you have cats or birds we just ask that you not change the litter or change the cage.  Please have someone else do this for you while you are on chemotherapy.   Food Safety During and After Cancer Treatment Food safety is important for people both during and after cancer treatment. Cancer and cancer treatments, such as chemotherapy, radiation therapy, and stem cell/bone marrow transplantation,  often weaken the immune system. This makes it harder for your body to protect itself from foodborne illness, also called food poisoning. Foodborne illness is caused by eating food that contains harmful bacteria, parasites, or viruses.  Foods to avoid Some foods have a higher risk of becoming tainted with bacteria. These include:  Unwashed fresh fruit and vegetables, especially leafy vegetables that can hide dirt and other contaminants  Raw sprouts, such as alfalfa sprouts  Raw or undercooked beef, especially ground beef, or other raw or undercooked meat and poultry  Fatty, fried, or spicy foods immediately before or after treatment.  These can sit heavy on your stomach and make you feel nauseous.  Raw or undercooked shellfish, such as oysters.  Sushi and sashimi, which often contain raw fish.   Unpasteurized beverages, such as unpasteurized fruit juices, raw milk, raw yogurt, or cider  Undercooked eggs, such as soft boiled, over easy, and poached; raw, unpasteurized eggs; or foods made with raw egg, such as homemade raw cookie dough and homemade mayonnaise Simple steps for food safety Shop smart.  Do not buy food stored or displayed in an unclean area.  Do not buy bruised or damaged fruits or vegetables.  Do not buy cans that have cracks, dents, or bulges.  Pick up foods that can spoil at the end of your shopping trip and store them in a cooler on the way home. Prepare and clean up foods carefully.  Rinse all fresh fruits and vegetables under running water, and dry them with a clean towel or paper towel.  Clean the top of cans before opening them.  After preparing food, wash your hands for 20 seconds with hot water and soap. Pay special attention to areas between fingers and under nails.  Clean your utensils and dishes with hot water and soap.  Disinfect your kitchen and cutting boards using 1 teaspoon of liquid, unscented bleach mixed into 1 quart of water.  Dispose of old  food.  Eat canned and packaged food before its expiration date (the "use by" or "best before" date).  Consume refrigerated leftovers within 3 to 4 days. After that time, throw out the food. Even if the food does not smell or look spoiled, it still may be unsafe. Some bacteria, such as Listeria, can grow even on foods stored in the refrigerator if they are kept for too long. Take precautions when eating out.  At restaurants, avoid buffets and salad bars where food sits out for a long time and comes in contact with many people. Food can become contaminated when someone with a virus, often a norovirus, or another "bug" handles it.  Put any leftover food in a "to-go" container yourself, rather than having the server do it. And, refrigerate leftovers as soon as you get home.  Choose restaurants that are clean and that are willing to prepare your food as you order it cooked.    MEDICATIONS: Zofran/Ondansetron 8mg  tablet. Take 1 tablet every 8 hours as needed for nausea/vomiting. (#1 nausea med to take, this  can constipate)  Compazine/Prochlorperazine 10mg  tablet. Take 1 tablet every 6 hours as needed for nausea/vomiting. (#2 nausea med to take, this can make you sleepy)   EMLA cream. Apply a quarter size amount to port site 1 hour prior to chemo. Do not rub in. Cover with plastic wrap.   Over-the-Counter Meds:  Miralax 1 capful in 8 oz of fluid daily. May increase to two times a day if needed. This is a stool softener. If this doesn't work proceed you can add:  Senokot S-start with 1 tablet two times a day and increase to 4 tablets two times a day if needed. (total of 8 tablets in a 24 hour period). This is a stimulant laxative.   Call us if this does not help your bowels move.   Imodium 2mg  capsule. Take 2 capsules after the 1st loose stool and then 1 capsule every 2 hours until you go a total of 12 hours without having a loose stool. Call the [facility] if loose stools  continue. If diarrhea occurs @ bedtime, take 2 capsules @ bedtime. Then take 2 capsules every 4 hours until morning. Call [facility].    Nausea Sheet  Zofran/Ondansetron 8mg  tablet. Take 1 tablet every 8 hours as needed for nausea/vomiting. (#1 nausea med to take, this can constipate)  Compazine/Prochlorperazine 10mg  tablet. Take 1 tablet every 6 hours as needed for nausea/vomiting. (#2 nausea med to take, this can make you sleepy)  You can take these medications together or separately.  We would first like for you to try the Ondansetron by itself and then take the Prochloperizine if needed. But you are allowed to take both medications at the same time if your nausea is that severe.  If you are having persistent nausea (nausea that does not stop) please take these medications on a staggered schedule so that the nausea medication stays in your body.  Please call the [facility] and let us know the amount of nausea that you are experiencing.  If you begin to vomit, you need to call the [facility] and if it is the weekend and you have vomited more than one time and cant get it to stop-go to the Emergency Room.  Persistent nausea/vomiting can lead to dehydration (loss of fluid in your body) and will make you feel terrible.   Ice chips, sips of clear liquids, foods that are @ room temperature, crackers, and toast tend to be better tolerated.    Constipation Sheet *Miralax in 8 oz of fluid daily.  May increase to two times a day if needed.  This is a stool softener.  If this not enough to keep your bowel regular:  You can add:  *Senokot S, start with one tablet twice a day and can increase to 4 tablets twice a day if needed.  This is a stimulant laxative.   Sometimes when you take pain medication you need BOTH a medicine to keep your stool soft and a medicine to help your bowel push it out!  Please call if the above does not work for you.   Do not go more than 2 days  without a bowel movement.  It is very important that you do not become constipated.  It will make you feel sick to your stomach (nausea) and can cause abdominal pain and vomiting.    Diarrhea Sheet  If you are having loose stools/diarrhea, please purchase Imodium and begin taking as outlined:  At the first sign of poorly formed  or loose stools you should begin taking Imodium(loperamide) 2 mg capsules.  Take two caplets (4mg ) followed by one caplet (2mg ) every 2 hours until you have had no diarrhea for 12 hours.  During the night take two caplets (4mg ) at bedtime and continue every 4 hours during the night until the morning.  Stop taking Imodium only after there is no sign of diarrhea for 12 hours.    Always call the [facility] if you are having loose stools/diarrhea that you can't get under control.  Loose stools/disrrhea leads to dehydration (loss of water) in your body.  We have other options of trying to get the loose stools/diarrhea to stopped but you must let us know!    SYMPTOMS TO REPORT AS SOON AS POSSIBLE AFTER TREATMENT:  FEVER GREATER THAN 100.5 F  CHILLS WITH OR WITHOUT FEVER  NAUSEA AND VOMITING THAT IS NOT CONTROLLED WITH YOUR NAUSEA MEDICATION  UNUSUAL SHORTNESS OF BREATH  UNUSUAL BRUISING OR BLEEDING  TENDERNESS IN MOUTH AND THROAT WITH OR WITHOUT PRESENCE OF ULCERS  URINARY PROBLEMS  BOWEL PROBLEMS  UNUSUAL RASH    Wear comfortable clothing and clothing appropriate for easy access to any Portacath or PICC line. Let us know if there is anything that we can do to make your therapy better!    What to do if you need assistance after hours or on the weekends: CALL [phone].  HOLD on the line, do not hang up.  You will hear multiple messages but at the end you will be connected with a nurse triage line.  They will contact the doctor if necessary.  Most of the time they will be able to assist you.  Do not call the hospital operator.      I have  been informed and understand all of the instructions given to me and have received a copy. I have been instructed to call the clinic [phone] or my family physician as soon as possible for continued medical care, if indicated. I do not have any more questions at this time but understand that I may call the [facility] or the Patient Navigator at [phone] during office hours should I have questions or need assistance in obtaining follow-up care.

## 2017-07-13 NOTE — Progress Notes (Signed)
Chemotherapy teaching pulled together and appts made. 

## 2017-07-20 ENCOUNTER — Ambulatory Visit (HOSPITAL_COMMUNITY): Payer: Medicare Other

## 2017-07-26 ENCOUNTER — Encounter (HOSPITAL_COMMUNITY): Payer: Medicare Other

## 2017-07-26 ENCOUNTER — Encounter: Payer: Self-pay | Admitting: Hematology

## 2017-07-26 ENCOUNTER — Ambulatory Visit (HOSPITAL_COMMUNITY): Payer: Medicare Other

## 2017-07-26 ENCOUNTER — Encounter (HOSPITAL_COMMUNITY): Payer: Medicare Other | Attending: Oncology

## 2017-07-26 ENCOUNTER — Encounter (HOSPITAL_COMMUNITY): Payer: Self-pay

## 2017-07-26 ENCOUNTER — Inpatient Hospital Stay (HOSPITAL_COMMUNITY): Payer: Medicare Other

## 2017-07-26 VITALS — BP 141/89 | HR 72 | Temp 98.4°F | Resp 18 | Wt 208.4 lb

## 2017-07-26 DIAGNOSIS — K8689 Other specified diseases of pancreas: Secondary | ICD-10-CM

## 2017-07-26 DIAGNOSIS — Z5111 Encounter for antineoplastic chemotherapy: Secondary | ICD-10-CM | POA: Diagnosis present

## 2017-07-26 DIAGNOSIS — C250 Malignant neoplasm of head of pancreas: Secondary | ICD-10-CM

## 2017-07-26 DIAGNOSIS — K869 Disease of pancreas, unspecified: Secondary | ICD-10-CM | POA: Insufficient documentation

## 2017-07-26 DIAGNOSIS — C259 Malignant neoplasm of pancreas, unspecified: Secondary | ICD-10-CM

## 2017-07-26 DIAGNOSIS — E871 Hypo-osmolality and hyponatremia: Secondary | ICD-10-CM

## 2017-07-26 LAB — COMPREHENSIVE METABOLIC PANEL
ALBUMIN: 3.3 g/dL — AB (ref 3.5–5.0)
ALT: 9 U/L — ABNORMAL LOW (ref 14–54)
AST: 20 U/L (ref 15–41)
Alkaline Phosphatase: 45 U/L (ref 38–126)
Anion gap: 10 (ref 5–15)
BUN: 13 mg/dL (ref 6–20)
CHLORIDE: 91 mmol/L — AB (ref 101–111)
CO2: 27 mmol/L (ref 22–32)
Calcium: 8.6 mg/dL — ABNORMAL LOW (ref 8.9–10.3)
Creatinine, Ser: 0.7 mg/dL (ref 0.44–1.00)
GFR calc Af Amer: >60 mL/min (ref >60)
GFR calc non Af Amer: >60 mL/min (ref >60)
GLUCOSE: 173 mg/dL — AB (ref 65–99)
POTASSIUM: 3.8 mmol/L (ref 3.5–5.1)
Sodium: 128 mmol/L — ABNORMAL LOW (ref 135–145)
Total Bilirubin: 0.5 mg/dL (ref 0.3–1.2)
Total Protein: 5.7 g/dL — ABNORMAL LOW (ref 6.5–8.1)

## 2017-07-26 LAB — CBC WITH DIFFERENTIAL/PLATELET
BASOS ABS: 0.1 10*3/uL (ref 0.0–0.1)
BASOS PCT: 1 %
EOS PCT: 9 %
Eosinophils Absolute: 0.6 10*3/uL (ref 0.0–0.7)
HCT: 29.1 % — ABNORMAL LOW (ref 36.0–46.0)
Hemoglobin: 9.7 g/dL — ABNORMAL LOW (ref 12.0–15.0)
Lymphocytes Relative: 26 %
Lymphs Abs: 1.9 10*3/uL (ref 0.7–4.0)
MCH: 30 pg (ref 26.0–34.0)
MCHC: 33.3 g/dL (ref 30.0–36.0)
MCV: 90.1 fL (ref 78.0–100.0)
MONO ABS: 0.9 10*3/uL (ref 0.1–1.0)
Monocytes Relative: 12 %
NEUTROS ABS: 3.9 10*3/uL (ref 1.7–7.7)
Neutrophils Relative %: 52 %
PLATELETS: 183 10*3/uL (ref 150–400)
RBC: 3.23 MIL/uL — ABNORMAL LOW (ref 3.87–5.11)
RDW: 14.7 % (ref 11.5–15.5)
WBC: 7.3 10*3/uL (ref 4.0–10.5)

## 2017-07-26 MED ORDER — SODIUM CHLORIDE 0.9 % IV SOLN
INTRAVENOUS | Status: DC
  Administered 2017-07-26: 12:00:00 via INTRAVENOUS

## 2017-07-26 MED ORDER — SODIUM CHLORIDE 0.9 % IV SOLN
225.0000 mg/m2/d | INTRAVENOUS | Status: DC
  Administered 2017-07-26: 3200 mg via INTRAVENOUS
  Filled 2017-07-26: qty 64

## 2017-07-26 NOTE — Progress Notes (Signed)
Tolerated infusion w/o incident.  Alert, in no distress.  Discharged via w/c in c/o daughter with ambulatory pump infusing. Instructions given regarding home infusion pump - verbalizes understanding.

## 2017-07-27 LAB — CANCER ANTIGEN 19-9: CA 19-9: 142 U/mL — ABNORMAL HIGH (ref 0–35)

## 2017-08-02 ENCOUNTER — Encounter (HOSPITAL_BASED_OUTPATIENT_CLINIC_OR_DEPARTMENT_OTHER): Payer: Medicare Other

## 2017-08-02 ENCOUNTER — Encounter (HOSPITAL_COMMUNITY): Payer: Self-pay

## 2017-08-02 ENCOUNTER — Ambulatory Visit (HOSPITAL_COMMUNITY): Payer: Medicare Other

## 2017-08-02 ENCOUNTER — Encounter (HOSPITAL_BASED_OUTPATIENT_CLINIC_OR_DEPARTMENT_OTHER): Payer: Medicare Other | Admitting: Oncology

## 2017-08-02 VITALS — BP 172/62 | HR 69 | Temp 98.1°F | Resp 20 | Wt 201.8 lb

## 2017-08-02 DIAGNOSIS — C259 Malignant neoplasm of pancreas, unspecified: Secondary | ICD-10-CM

## 2017-08-02 DIAGNOSIS — K869 Disease of pancreas, unspecified: Secondary | ICD-10-CM | POA: Diagnosis not present

## 2017-08-02 DIAGNOSIS — R634 Abnormal weight loss: Secondary | ICD-10-CM

## 2017-08-02 DIAGNOSIS — Z5111 Encounter for antineoplastic chemotherapy: Secondary | ICD-10-CM

## 2017-08-02 DIAGNOSIS — K8689 Other specified diseases of pancreas: Secondary | ICD-10-CM

## 2017-08-02 LAB — CBC WITH DIFFERENTIAL/PLATELET
Basophils Absolute: 0 10*3/uL (ref 0.0–0.1)
Basophils Relative: 1 %
EOS ABS: 0.3 10*3/uL (ref 0.0–0.7)
EOS PCT: 6 %
HCT: 32 % — ABNORMAL LOW (ref 36.0–46.0)
HEMOGLOBIN: 10.5 g/dL — AB (ref 12.0–15.0)
LYMPHS ABS: 0.7 10*3/uL (ref 0.7–4.0)
LYMPHS PCT: 14 %
MCH: 29.2 pg (ref 26.0–34.0)
MCHC: 32.8 g/dL (ref 30.0–36.0)
MCV: 89.1 fL (ref 78.0–100.0)
MONOS PCT: 8 %
Monocytes Absolute: 0.4 10*3/uL (ref 0.1–1.0)
Neutro Abs: 3.8 10*3/uL (ref 1.7–7.7)
Neutrophils Relative %: 71 %
PLATELETS: 196 10*3/uL (ref 150–400)
RBC: 3.59 MIL/uL — AB (ref 3.87–5.11)
RDW: 14.5 % (ref 11.5–15.5)
WBC: 5.3 10*3/uL (ref 4.0–10.5)

## 2017-08-02 LAB — COMPREHENSIVE METABOLIC PANEL
ALK PHOS: 46 U/L (ref 38–126)
ALT: 9 U/L — AB (ref 14–54)
ANION GAP: 10 (ref 5–15)
AST: 17 U/L (ref 15–41)
Albumin: 3.6 g/dL (ref 3.5–5.0)
BUN: 13 mg/dL (ref 6–20)
CALCIUM: 8.9 mg/dL (ref 8.9–10.3)
CO2: 29 mmol/L (ref 22–32)
CREATININE: 0.66 mg/dL (ref 0.44–1.00)
Chloride: 93 mmol/L — ABNORMAL LOW (ref 101–111)
Glucose, Bld: 194 mg/dL — ABNORMAL HIGH (ref 65–99)
Potassium: 3.9 mmol/L (ref 3.5–5.1)
SODIUM: 132 mmol/L — AB (ref 135–145)
Total Bilirubin: 0.6 mg/dL (ref 0.3–1.2)
Total Protein: 6.1 g/dL — ABNORMAL LOW (ref 6.5–8.1)

## 2017-08-02 MED ORDER — SODIUM CHLORIDE 0.9% FLUSH
10.0000 mL | INTRAVENOUS | Status: DC | PRN
  Administered 2017-08-02: 10 mL
  Filled 2017-08-02: qty 10

## 2017-08-02 MED ORDER — SODIUM CHLORIDE 0.9 % IV SOLN
225.0000 mg/m2/d | INTRAVENOUS | Status: DC
  Administered 2017-08-02: 3200 mg via INTRAVENOUS
  Filled 2017-08-02: qty 50

## 2017-08-02 NOTE — Progress Notes (Signed)
Renee Jackson tolerated 5FU pump infusion well this past week. She has experienced some neuropathy pain in fingers and toes which was addressed by MD today. Labs reviewed with Dr. Talbert Cage prior to connecting the new 5FU pump. VSS upon discharge B/P slightly elevated today but pt has not taken her B/P medication yet and will do so as soon as she returns home. Pt discharged via wheelchair in satisfactory condition accompanied by her daughter

## 2017-08-02 NOTE — Progress Notes (Signed)
[facility] Hematology/Oncology Progress Note   Name: Renee Jackson      MRN: [id number]    Location: Room/bed info not found  Date: 08/02/2017 Time:1:25 PM   REFERRING PHYSICIAN:  Gar Ponto, MD (Primary Care Provider)  REASON FOR CONSULT:  Pancreatic Cancer   DIAGNOSIS: Pancreatic Adenocarcinoma    Malignant neoplasm of pancreas ([place])   03/21/2017 Tumor Marker    Patient's tumor was tested for the following markers: CA 19-9 Results of the tumor marker test revealed 301.      03/21/2017 Tumor Marker    Patient's tumor was tested for the following markers: CA 19-9. Results of the tumor marker test revealed 301.      03/27/2017 PET scan    Relatively low-level hypermetabolism corresponding to the pancreatic head mass. This measures on the order of 3.8 cm and a S.U.V. max of 4.4 on image 107/series 4. Re- demonstration of pancreatic body and tail atrophy with duct dilatation.  No other hypermetabolism within abdominopelvic nodes or parenchyma. Abdominopelvic findings deferred to prior diagnostic CT. Cholelithiasis. Abdominal aortic atherosclerosis. Fat containing ventral abdominal wall laxity, status post hernia repair.      03/30/2017 Initial Diagnosis    Pancreatic adenocarcinoma ([place])      03/30/2017 Procedure    EUS with FNA of pancreatic mass performed, path positive for adenocarcinoma.      04/26/2017 - 06/12/2017 Neo-Adjuvant Chemotherapy    Gemzar/Abraxane days 1, 8, & 15 every 28 days x 2 cycles      05/29/2017 Tumor Marker    Patient's tumor was tested for the following markers: CA 19-9. Results of the tumor marker test revealed 42.      06/21/2017 PET scan    IMPRESSION: 1. Given cross modality comparison, similar size of a pancreatic head mass since 03/17/2017. 2. Tumor contact with the splenoportal confluence and superior mesenteric vein, as above. No arterial encasement. 3. Coronary artery atherosclerosis. Aortic  Atherosclerosis (ICD10-I70.0). 4. Mild congestive heart failure with bilateral pleural effusions. 5. Hepatomegaly. 6. Probable cholelithiasis.      07/03/2017 Miscellaneous    Re-consult with Dr. Jyl Heinz (Surg Onc) [facility].  Note is available for review in [place].  He has recommended chemoXRT in an attempt to drive response.  Tumor currently abuts SMV which would require vein resection.  If tumor shrinks with chemoXRT, may be a candidate for whipple or non-thermal ablation technique (IRE).      HISTORY OF PRESENT ILLNESS:   Renee Jackson is a 71 y.o. female returns for follow up of a pancreatic mass suspicious for cancer.  Patient reports that she presented to her primary care physician with epigastric discomfort.  It was presumed that she had issues with her gallbladder.  She was treated symptomatically with dietary restrictions.  Her epigastric pain continued and symptoms progressed to include nausea and vomiting with all foods.  This led her to the emergency department in April 2018.  In the emergency department, ultrasound was performed which led to further imaging including MR abdomen.  MRI of the abdomen demonstrated a hypoenhancing mass in the pancreatic body and head measuring 3.9 x 2.1 x 3.5 cm extending to the posterior medial pancreatic margin and abutting the edge of the portal vein and SMV without obvious invasion or wrapping around of the structures.  The common bile duct skirts the right lateral margin of the mass is not definitely encased by the mass.  There is no common bile  duct dilatation.  No definite venous invasion of the mass nor wrapping of the venous structures by the mass identified. 03/30/17: EUS with FNA of pancreatic mass performed, path positive for adenocarcinoma. She reports saw Dr. Crisoforo Oxford for surgical evaluation on 5/1 and he recommended neoadjuvant chemotherapy prior to surgery. Patient received 2 cycles of neoadjuvant gemzar/abraxane from 04/26/17-06/12/17.  She did not tolerate chemotherapy well and had multiple side effects especially with progressive fatigue and generalized weakness. Repeat PET scan was performed on 7/11/8 which demonstrated that the pancreatic mass remains the same in size 4x3 cm without any arterial involvement still. She saw Dr. Crisoforo Oxford on 07/03/2017. Dr. Crisoforo Oxford noted that the mass abuts the SMV which would require vein resection at time of surgery and thereby a much more intense surgical procedure.  He admits that the patient clinically is much improved, but still a sub-optimal surgical candidate at this time.  He recommended chemoXRT in an attempt to improve response.  IF tumor shrinks, he would consider her again for a whipple versus non-thermal ablation (IRE). She was started on 5-FU on cycle 1 day 1 on 07/26/17. She started concurrent RT on 07/27/17.  Today patient presented for follow up and cycle 2 of infusional 5FU. She states that she tolerated 5-FU well last week. She denies any mucositis, nausea, vomiting, diarrhea. She is lost some weight since her last visit, however she states that she's been eating healthier with more fruits and vegetables. She denies having any epigastric pain. She denies any shortness breath or chest pain. She is tolerating her radiation treatments well.   Review of Systems  Constitutional: Positive for malaise/fatigue and weight loss. Negative for chills and fever.  HENT: Negative.  Negative for hearing loss, sore throat and tinnitus.   Eyes: Negative.  Negative for blurred vision, photophobia and discharge.  Respiratory: Negative.  Negative for cough, hemoptysis, shortness of breath and wheezing.   Cardiovascular: Negative for chest pain, palpitations, orthopnea, claudication and leg swelling.  Gastrointestinal: Negative for abdominal pain, constipation, diarrhea, melena, nausea and vomiting.  Genitourinary: Negative.  Negative for dysuria and hematuria.  Musculoskeletal: Negative.  Negative for back pain,  joint pain and myalgias.  Skin: Negative.  Negative for itching and rash.  Neurological: Negative for dizziness, weakness and headaches.  Endo/Heme/Allergies: Negative.  Negative for environmental allergies and polydipsia. Does not bruise/bleed easily.  Psychiatric/Behavioral: Negative.  Negative for depression. The patient is not nervous/anxious and does not have insomnia.   All other systems reviewed and are negative. 14 point review of systems was performed and is negative except as detailed under history of present illness and above  PAST MEDICAL HISTORY:   Past Medical History:  Diagnosis Date  . Arthritis    hands  . Asthma   . Atrial fibrillation ([place])   . Cancer [facility])    pancreatic  . Chronic lower back pain   . COPD (chronic obstructive pulmonary disease) ([place])   . Depression   . Diabetes ([place])    type 2  . Diabetic neuropathy ([facility])    feet and legs  . Dyspnea   . GERD (gastroesophageal reflux disease)   . Headache    migraines  . Hypercholesteremia   . Hypertension   . Malignant neoplasm of pancreas ([place]) 04/06/2017  . Neuropathy    both feet  . Pancreatic mass 03/21/2017  . Pancreatic mass   . Pneumonia 1995 severe case   2 times before that  . Seizures ([place]) as child  to age 82  with high fevers  . Sleep apnea    cpap uses sometimes  . Umbilical hernia     ALLERGIES: Allergies  Allergen Reactions  . Sulfa Antibiotics Itching      MEDICATIONS: I have reviewed the patient's current medications.    Current Outpatient Prescriptions on File Prior to Visit  Medication Sig Dispense Refill  . aspirin EC 81 MG tablet Take 81 mg by mouth daily.    Marland Kitchen atorvastatin (LIPITOR) 10 MG tablet Take 10 mg by mouth every morning.     . cetirizine (ZYRTEC) 10 MG tablet Take 10 mg by mouth at bedtime.    . citalopram (CELEXA) 20 MG tablet Take 20 mg by mouth every morning.     . Diphenhyd-Hydrocort-Nystatin (FIRST-DUKES MOUTHWASH) SUSP Use as directed 5 mLs in the mouth  or throat every 4 (four) hours as needed. 240 mL 3  . diphenoxylate-atropine (LOMOTIL) 2.5-0.025 MG tablet Take 1 tablet by mouth 4 (four) times daily as needed for diarrhea or loose stools. 90 tablet 1  . fluorouracil CALGB 08657 in sodium chloride 0.9 % 150 mL Inject into the vein. Continuous for 6 weeks with radiation    . furosemide (LASIX) 20 MG tablet Take 2 tablets (40 mg total) by mouth daily as needed (leg swelling). 30 tablet 1  . gabapentin (NEURONTIN) 300 MG capsule Take 300 mg by mouth 2 (two) times daily.  3  . glipiZIDE (GLUCOTROL XL) 10 MG 24 hr tablet Take 10 mg by mouth daily with breakfast.     . hydrochlorothiazide (HYDRODIURIL) 25 MG tablet Take 25 mg by mouth at bedtime.     Marland Kitchen HYDROcodone-acetaminophen (NORCO/VICODIN) 5-325 MG tablet Take 1 tablet by mouth every 4 (four) hours as needed for moderate pain or severe pain. 90 tablet 0  . lidocaine (XYLOCAINE) 2 % solution Use as directed 5 mLs in the mouth or throat every 4 (four) hours as needed for mouth pain. Mixed with Duke's Magic Mouthwash    . lidocaine-prilocaine (EMLA) cream Apply to affected area once 30 g 3  . LORazepam (ATIVAN) 0.5 MG tablet Take 1 tablet (0.5 mg total) by mouth every 6 (six) hours as needed (Nausea or vomiting). 30 tablet 0  . losartan (COZAAR) 100 MG tablet Take 100 mg by mouth every morning.     . metFORMIN (GLUCOPHAGE) 1000 MG tablet Take 1,000 mg by mouth 2 (two) times daily with a meal.     . metoprolol succinate (TOPROL-XL) 50 MG 24 hr tablet Take 50 mg by mouth every morning.     . montelukast (SINGULAIR) 10 MG tablet Take 10 mg by mouth every morning.     Marland Kitchen morphine (MS CONTIN) 15 MG 12 hr tablet Take 1 tablet (15 mg total) by mouth every 12 (twelve) hours. 60 tablet 0  . Multiple Vitamins-Minerals (MULTIVITAMIN GUMMIES ADULT) CHEW Chew 2 tablets by mouth daily.    Marland Kitchen omeprazole (PRILOSEC) 20 MG capsule Take 40 mg by mouth every morning.     . ondansetron (ZOFRAN) 8 MG tablet Take 1 tablet (8  mg total) by mouth 2 (two) times daily as needed (Nausea or vomiting). 30 tablet 1  . potassium chloride SA (K-DUR,KLOR-CON) 20 MEQ tablet Take 1 tablet (20 mEq total) by mouth 2 (two) times daily. 60 tablet 3  . prochlorperazine (COMPAZINE) 10 MG tablet Take 1 tablet (10 mg total) by mouth every 6 (six) hours as needed (Nausea or vomiting). 30 tablet 1  . promethazine (PHENERGAN) 25 MG tablet  Take 1 tablet (25 mg total) by mouth every 6 (six) hours as needed for nausea or vomiting. 30 tablet 1  . Simethicone (ALKA-SELTZER ANTI-GAS PO) Take 1 tablet by mouth daily as needed (gas).     Current Facility-Administered Medications on File Prior to Visit  Medication Dose Route Frequency Provider Last Rate Last Dose  . fluorouracil (ADRUCIL) 3,200 mg in sodium chloride 0.9 % 86 mL chemo infusion  225 mg/m2/day (Treatment Plan Recorded) Intravenous 7 days Twana First, MD   3,200 mg at 08/02/17 1150  . sodium chloride flush (NS) 0.9 % injection 10 mL  10 mL Intracatheter PRN Twana First, MD   10 mL at 08/02/17 1010     PAST SURGICAL HISTORY Past Surgical History:  Procedure Laterality Date  . CESAREAN SECTION     x 1  . EUS N/A 03/30/2017   Procedure: UPPER ENDOSCOPIC ULTRASOUND (EUS) LINEAR;  Surgeon: Milus Banister, MD;  Location: [facility];  Service: Endoscopy;  Laterality: N/A;  . HERNIA REPAIR     x 2 umbilical   . PORT-A-CATH REMOVAL Left 05/26/2017   Procedure: REMOVAL PORT-A-CATH;  Surgeon: Andres Labrum, MD;  Location: [facility] OR[facility];  Service: General;  Laterality: Left;  . PORTACATH PLACEMENT N/A 04/21/2017   Procedure: INSERTION PORT-A-CATH;  Surgeon: Aviva Signs, MD;  Location: [facility] OR[facility];  Service: General;  Laterality: N/A;  . PORTACATH PLACEMENT Right 05/26/2017   Procedure: INSERTION PORT-A-CATH;  Surgeon: Andres Labrum, MD;  Location: [facility] OR[facility];  Service: General;  Laterality: Right;  . UMBILICAL HERNIA REPAIR     multiple times    FAMILY HISTORY: Family History  Problem Relation Age  of Onset  . Heart disease Mother   . Dementia Father    Mother deceased at the age of 46 secondary to complications of heart disease and diabetes. Father deceased at the age of 68 secondary to complications of dementia. She has 5 brothers, 4 of which are deceased. She has 3 sisters. She has a daughter 56 years old with multiple medical issues including intracranial hypertension, neuropathy, back issues, and morbid obesity. She has 49 son is 24 years old in good health.  SOCIAL HISTORY:  reports that she quit smoking about 23 years ago. Her smoking use included Cigarettes. She has a 20.00 pack-year smoking history. She has never used smokeless tobacco. She reports that she does not drink alcohol or use drugs.  She denies any illicit drug abuse.  She rarely drinks alcohol.  She does have a 1 pack per day 20 year smoking history quitting 22 years ago.  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  She is Psychologist, forensic and religion.  She is widowed.  Her husband passed away in Nov 19, 2016 secondary to stage IV pancreatic cancer.  He was not offered chemotherapy given his extent of disease.  Social History   Social History  . Marital status: Married    Spouse name: N/A  . Number of children: N/A  . Years of education: N/A   Social History Main Topics  . Smoking status: Former Smoker    Packs/day: 1.00    Years: 20.00    Types: Cigarettes    Quit date: 03/21/1994  . Smokeless tobacco: Never Used  . Alcohol use No  . Drug use: No  . Sexual activity: No   Other Topics Concern  . None   Social History Narrative  . None  PERFORMANCE STATUS: The patient's performance status is 1 - Symptomatic but completely ambulatory  PHYSICAL EXAM:  There were no vitals filed for this visit.  Physical Exam  Constitutional: She is oriented to person, place, and time.  HENT:  Head: Normocephalic and atraumatic.  Eyes: Pupils are equal,  round, and reactive to light. Conjunctivae and EOM are normal.  Neck: Normal range of motion. Neck supple.  Cardiovascular: Normal rate, regular rhythm and normal heart sounds.   Pulmonary/Chest: Effort normal and breath sounds normal.  Left chest wall chemoport C/D/I.  Abdominal: Soft. Bowel sounds are normal.  Musculoskeletal: Normal range of motion. She exhibits no edema.  Neurological: She is alert and oriented to person, place, and time. Gait normal.  Skin: Skin is warm and dry. No rash noted.  Nursing note and vitals reviewed.  LABORATORY DATA:  CBC    Component Value Date/Time   WBC 5.3 08/02/2017 0939   RBC 3.59 (L) 08/02/2017 0939   HGB 10.5 (L) 08/02/2017 0939   HCT 32.0 (L) 08/02/2017 0939   PLT 196 08/02/2017 0939   MCV 89.1 08/02/2017 0939   MCH 29.2 08/02/2017 0939   MCHC 32.8 08/02/2017 0939   RDW 14.5 08/02/2017 0939   LYMPHSABS 0.7 08/02/2017 0939   MONOABS 0.4 08/02/2017 0939   EOSABS 0.3 08/02/2017 0939   BASOSABS 0.0 08/02/2017 0939     Chemistry      Component Value Date/Time   NA 132 (L) 08/02/2017 0939   K 3.9 08/02/2017 0939   CL 93 (L) 08/02/2017 0939   CO2 29 08/02/2017 0939   BUN 13 08/02/2017 0939   CREATININE 0.66 08/02/2017 0939      Component Value Date/Time   CALCIUM 8.9 08/02/2017 0939   ALKPHOS 46 08/02/2017 0939   AST 17 08/02/2017 0939   ALT 9 (L) 08/02/2017 0939   BILITOT 0.6 08/02/2017 0939      RADIOGRAPHY: I have personally reviewed the radiological images as listed and agreed with the findings in the report. CT ABDOMEN PELVIS W CONTRAST (Accession [id number]) (Order [id number])  Imaging  Date: 06/20/2017 Department: [facility] Released By: Brett Fairy Authorizing: Holley Bouche, NP  Exam Information   Status Exam Begun  Exam Ended   Final [99] 06/21/2017 8:43 AM 06/21/2017 9:02 AM  PACS Images   Show images for CT ABDOMEN PELVIS W CONTRAST  Study Result   CLINICAL DATA:  Pancreatic adenocarcinoma.   Restaging.  EXAM: CT CHEST, ABDOMEN, AND PELVIS WITH CONTRAST  TECHNIQUE: Multidetector CT imaging of the chest, abdomen and pelvis was performed following the standard protocol during bolus administration of intravenous contrast.  CONTRAST:  149mL ISOVUE-300 IOPAMIDOL (ISOVUE-300) INJECTION 61%  COMPARISON:  Chest radiograph 05/26/2017. PET 03/27/2017. Abdominal MRI of 03/17/2017. CT of the abdomen of 03/16/2017. Most recent chest CT 03/08/2010.  FINDINGS: CT CHEST FINDINGS  Cardiovascular: Aortic and branch vessel atherosclerosis. Mild cardiomegaly. Multivessel coronary artery atherosclerosis. Right-sided Port-A-Cath terminates at the high right atrium.  Mediastinum/Nodes: No supraclavicular adenopathy. No mediastinal or hilar adenopathy.  Lungs/Pleura: Trace bilateral pleural fluid. Smooth septal thickening is most apparent at the apices. Scattered areas of ground-glass opacity.  Musculoskeletal: No acute osseous abnormality.  CT ABDOMEN PELVIS FINDINGS  Hepatobiliary: Hepatomegaly at 19.3 cm craniocaudal. No focal liver lesion. Suspect small gallstones, without acute cholecystitis or biliary duct dilatation.  Pancreas: Pancreatic atrophy and upstream duct dilatation identified within the tail and body. Pancreatic head mass measures on the order of 4.1 x 3.1 cm  on image 62/ series 2. Compare 4.0 x 3.0 cm on the prior MRI (when remeasured). No acute pancreatitis.  Spleen: Normal in size, without focal abnormality.  Adrenals/Urinary Tract: Normal adrenal glands. Normal kidneys, without hydronephrosis. Normal urinary bladder.  Stomach/Bowel: Tiny hiatal hernia. Normal distal stomach. Scattered colonic diverticula. Normal terminal ileum and appendix. Normal small bowel.  Vascular/Lymphatic: Abdominal aortic and branch vessel atherosclerosis.  Retroaortic left renal vein. There is contact with less than 180 degrees of the superior mesenteric vein  on image 62/series 2. Anterior aspect of the splenoportal confluence is also contacted by tumor including on image 60/series 2. No narrowing. No arterially involvement of the celiac or superior mesenteric artery.  No abdominopelvic adenopathy.  Reproductive: Normal uterus and adnexa.  Other: No significant free fluid. Moderate pelvic floor laxity. Ventral abdominal wall laxity.  Musculoskeletal: Trace L4-5 anterolisthesis is likely degenerative. Degenerate disc disease involves the lumbosacral junction.  IMPRESSION: 1. Given cross modality comparison, similar size of a pancreatic head mass since 03/17/2017. 2. Tumor contact with the splenoportal confluence and superior mesenteric vein, as above. No arterial encasement. 3. Coronary artery atherosclerosis. Aortic Atherosclerosis (ICD10-I70.0). 4. Mild congestive heart failure with bilateral pleural effusions. 5. Hepatomegaly. 6. Probable cholelithiasis.    03/30/2017    PET Scan 03/27/2017 IMPRESSION: 1. Relatively low-level hypermetabolism corresponding to the pancreatic head mass. 2. Given relatively low-level hypermetabolism within the primary, PET is likely of low sensitivity for metastatic disease. Given this mild limitation, no hypermetabolic metastatic disease identified. 3. Small hiatal hernia 4.  Coronary artery atherosclerosis. Aortic atherosclerosis. 5. Cholelithiasis.  PATHOLOGY:     ASSESSMENT/PLAN:  Stage IB Pancreatic Adenocarcinoma Cancer Staging Malignant neoplasm of pancreas [facility]) Staging form: Exocrine Pancreas, AJCC 8th Edition - Clinical stage from 04/06/2017: Stage IB (cT2, cN0, cM0) - Signed by Twana First, MD on 04/06/2017   PLAN: -Continue concurrent chemoRT, planned for 6 weeks of treatment. Patient tolerating 5FU well. Labs reviewed, proceed with cycle 2 of 5FU today. -Continue radiation as planned. Her tentative finish date for her radiation is 09/05/17.  -RTC in 2 weeks for follow  up.   All questions were answered. The patient knows to call the clinic with any problems, questions or concerns. We can certainly see the patient much sooner if necessary.   This note is electronically signed by: Twana First, MD 08/02/2017 1:25 PM

## 2017-08-02 NOTE — Patient Instructions (Signed)
[facility] Discharge Instructions for Patients Receiving Chemotherapy   Beginning January 23rd 2017 lab work for the Cancer Center will be done in the  Main lab at [place] on 1st floor. If you have a lab appointment with the Cancer Center please come in thru the  Main Entrance and check in at the main information desk   Today you received the following chemotherapy agents 5FU. Follow-up as scheduled. Call clinic for any questions or concerns  To help prevent nausea and vomiting after your treatment, we encourage you to take your nausea medication   If you develop nausea and vomiting, or diarrhea that is not controlled by your medication, call the clinic.  The clinic phone number is [phone]. Office hours are Monday-Friday 8:30am-5:00pm.  BELOW ARE SYMPTOMS THAT SHOULD BE REPORTED IMMEDIATELY:  *FEVER GREATER THAN 101.0 F  *CHILLS WITH OR WITHOUT FEVER  NAUSEA AND VOMITING THAT IS NOT CONTROLLED WITH YOUR NAUSEA MEDICATION  *UNUSUAL SHORTNESS OF BREATH  *UNUSUAL BRUISING OR BLEEDING  TENDERNESS IN MOUTH AND THROAT WITH OR WITHOUT PRESENCE OF ULCERS  *URINARY PROBLEMS  *BOWEL PROBLEMS  UNUSUAL RASH Items with * indicate a potential emergency and should be followed up as soon as possible. If you have an emergency after office hours please contact your primary care physician or go to the nearest emergency department.  Please call the clinic during office hours if you have any questions or concerns.   You may also contact the Patient Navigator at [phone] should you have any questions or need assistance in obtaining follow up care.      Resources For Cancer Patients and their Caregivers ? American Cancer Society: Can assist with transportation, wigs, general needs, runs Look Good Feel Better.        1-888-227-6333 ? Cancer Care: Provides financial assistance, online support groups, medication/co-pay assistance.  1-800-813-HOPE  (4673) ? [facility] Assists [place] cancer patients and their families through emotional , educational and financial support.  336-427-4357 ? [place] DSS Where to apply for food stamps, Medicaid and utility assistance. 336-342-1394 ? [facility]: Transportation to medical appointments. 336-347-2287 ? Social Security Administration: May apply for disability if have a Stage IV cancer. 336-342-7796 1-800-772-1213 ? [place] Aging, Disability and Transit Services: Assists with nutrition, care and transit needs. 336-349-2343         

## 2017-08-03 LAB — CANCER ANTIGEN 19-9: CAN 19-9: 249 U/mL — AB (ref 0–35)

## 2017-08-08 ENCOUNTER — Other Ambulatory Visit (HOSPITAL_COMMUNITY): Payer: Self-pay | Admitting: Oncology

## 2017-08-09 ENCOUNTER — Encounter (HOSPITAL_COMMUNITY): Payer: Self-pay

## 2017-08-09 ENCOUNTER — Encounter (HOSPITAL_BASED_OUTPATIENT_CLINIC_OR_DEPARTMENT_OTHER): Payer: Medicare Other

## 2017-08-09 ENCOUNTER — Ambulatory Visit (HOSPITAL_COMMUNITY): Payer: Medicare Other

## 2017-08-09 VITALS — BP 162/77 | HR 73 | Temp 98.3°F | Resp 18 | Wt 200.6 lb

## 2017-08-09 DIAGNOSIS — K869 Disease of pancreas, unspecified: Secondary | ICD-10-CM | POA: Diagnosis not present

## 2017-08-09 DIAGNOSIS — Z5111 Encounter for antineoplastic chemotherapy: Secondary | ICD-10-CM

## 2017-08-09 DIAGNOSIS — C259 Malignant neoplasm of pancreas, unspecified: Secondary | ICD-10-CM | POA: Diagnosis not present

## 2017-08-09 DIAGNOSIS — K8689 Other specified diseases of pancreas: Secondary | ICD-10-CM

## 2017-08-09 LAB — CBC WITH DIFFERENTIAL/PLATELET
BASOS ABS: 0.1 10*3/uL (ref 0.0–0.1)
BASOS PCT: 1 %
Eosinophils Absolute: 0.2 10*3/uL (ref 0.0–0.7)
Eosinophils Relative: 4 %
HEMATOCRIT: 35.9 % — AB (ref 36.0–46.0)
HEMOGLOBIN: 11.7 g/dL — AB (ref 12.0–15.0)
Lymphocytes Relative: 9 %
Lymphs Abs: 0.5 10*3/uL — ABNORMAL LOW (ref 0.7–4.0)
MCH: 29.2 pg (ref 26.0–34.0)
MCHC: 32.6 g/dL (ref 30.0–36.0)
MCV: 89.5 fL (ref 78.0–100.0)
MONOS PCT: 10 %
Monocytes Absolute: 0.6 10*3/uL (ref 0.1–1.0)
NEUTROS ABS: 4.3 10*3/uL (ref 1.7–7.7)
NEUTROS PCT: 76 %
Platelets: 190 10*3/uL (ref 150–400)
RBC: 4.01 MIL/uL (ref 3.87–5.11)
RDW: 14.9 % (ref 11.5–15.5)
WBC: 5.7 10*3/uL (ref 4.0–10.5)

## 2017-08-09 LAB — COMPREHENSIVE METABOLIC PANEL
ALBUMIN: 3.8 g/dL (ref 3.5–5.0)
ALK PHOS: 59 U/L (ref 38–126)
ALT: 10 U/L — ABNORMAL LOW (ref 14–54)
AST: 19 U/L (ref 15–41)
Anion gap: 10 (ref 5–15)
BILIRUBIN TOTAL: 0.4 mg/dL (ref 0.3–1.2)
BUN: 14 mg/dL (ref 6–20)
CALCIUM: 9.2 mg/dL (ref 8.9–10.3)
CO2: 27 mmol/L (ref 22–32)
Chloride: 96 mmol/L — ABNORMAL LOW (ref 101–111)
Creatinine, Ser: 0.71 mg/dL (ref 0.44–1.00)
GFR calc Af Amer: >60 mL/min (ref >60)
GFR calc non Af Amer: >60 mL/min (ref >60)
GLUCOSE: 263 mg/dL — AB (ref 65–99)
Potassium: 3.8 mmol/L (ref 3.5–5.1)
Sodium: 133 mmol/L — ABNORMAL LOW (ref 135–145)
TOTAL PROTEIN: 6.4 g/dL — AB (ref 6.5–8.1)

## 2017-08-09 MED ORDER — SODIUM CHLORIDE 0.9 % IV SOLN
225.0000 mg/m2/d | INTRAVENOUS | Status: DC
  Administered 2017-08-09: 3200 mg via INTRAVENOUS
  Filled 2017-08-09: qty 50

## 2017-08-09 MED ORDER — SODIUM CHLORIDE 0.9% FLUSH
10.0000 mL | INTRAVENOUS | Status: DC | PRN
  Administered 2017-08-09: 10 mL
  Filled 2017-08-09: qty 10

## 2017-08-09 NOTE — Progress Notes (Signed)
Patient discharged with home infusion pump running. Patient discharged ambulatory and in stable condition with daughter. Patient to follow up in 1 week to have pump bag changed. Appointment time given to patient and she agrees.

## 2017-08-09 NOTE — Progress Notes (Signed)
Patient arrived today with home infusion pump running with 1.0 ml remaining. Patient reports that she had an incident this week where the pump was beeping and she had to call the customer support center and they assisted her.  She hasn't had any more problems with the pump since that time.

## 2017-08-09 NOTE — Patient Instructions (Signed)
[facility] Discharge Instructions for Patients Receiving Chemotherapy   Beginning January 23rd 2017 lab work for the [facility] will be done in the  Main lab at [facility] on 1st floor. If you have a lab appointment with the [facility] please come in thru the  Main Entrance and check in at the main information desk   Today you received the following chemotherapy agents: Flurourocil  To help prevent nausea and vomiting after your treatment, we encourage you to take your nausea medication as prescribed.   If you develop nausea and vomiting, or diarrhea that is not controlled by your medication, call the clinic.  The clinic phone number is [phone]. Office hours are Monday-Friday 8:30am-5:00pm.  BELOW ARE SYMPTOMS THAT SHOULD BE REPORTED IMMEDIATELY:  *FEVER GREATER THAN 101.0 F  *CHILLS WITH OR WITHOUT FEVER  NAUSEA AND VOMITING THAT IS NOT CONTROLLED WITH YOUR NAUSEA MEDICATION  *UNUSUAL SHORTNESS OF BREATH  *UNUSUAL BRUISING OR BLEEDING  TENDERNESS IN MOUTH AND THROAT WITH OR WITHOUT PRESENCE OF ULCERS  *URINARY PROBLEMS  *BOWEL PROBLEMS  UNUSUAL RASH Items with * indicate a potential emergency and should be followed up as soon as possible. If you have an emergency after office hours please contact your primary care physician or go to the nearest emergency department.  Please call the clinic during office hours if you have any questions or concerns.   You may also contact the Patient Navigator at [phone] should you have any questions or need assistance in obtaining follow up care.      Resources For Cancer Patients and their Caregivers ? American Cancer Society: Can assist with transportation, wigs, general needs, runs Look Good Feel Better.        539-355-9980 ? Cancer Care: Provides financial assistance, online support groups, medication/co-pay assistance.  1-800-813-HOPE 949-671-7249) ? [facility] Assists  [place] cancer patients and their families through emotional , educational and financial support.  6167521483 ? [place] DSS Where to apply for food stamps, Medicaid and utility assistance. (416) 737-1454 ? RCATS: Transportation to medical appointments. 240-614-5794 ? Social Security Administration: May apply for disability if have a Stage IV cancer. (317)450-2584 (952) 745-5550 ? [employer], Disability and Transit Services: Assists with nutrition, care and transit needs. 2024754447

## 2017-08-10 LAB — CANCER ANTIGEN 19-9: CA 19-9: 381 U/mL — ABNORMAL HIGH (ref 0–35)

## 2017-08-16 ENCOUNTER — Ambulatory Visit (HOSPITAL_COMMUNITY): Payer: Medicare Other | Admitting: Adult Health

## 2017-08-16 ENCOUNTER — Telehealth (HOSPITAL_COMMUNITY): Payer: Self-pay

## 2017-08-16 ENCOUNTER — Ambulatory Visit (HOSPITAL_COMMUNITY): Payer: Medicare Other

## 2017-08-16 NOTE — Telephone Encounter (Signed)
-----   Message from Darlina Guys, LPN sent at 02/12/3544  1:08 PM EDT ----- Regarding: FW: Genetics referral Left message for patient to return call. ----- Message ----- From: Twana First, MD Sent: 08/15/2017   9:46 AM To: Darlina Guys, LPN Subject: FW: Genetics referral                          Please call patient to ask if she's interested in genetic testing. If yes, please refer.   ----- Message ----- From: Clarene Essex, Counselor Sent: 08/08/2017   8:26 AM To: Twana First, MD, [facility], PA-C, # Subject: Genetics referral                              Good morning,  NCCN now recommends that patients with pancreatic cancer be referred for genetic testing.  This patient is on the cancer registry as someone with PC.  Please refer if she is interested.  Santiago Glad

## 2017-08-16 NOTE — Telephone Encounter (Signed)
Was able to speak with patient this am. She states her daughters significant other "wanted her out of the house" so she has moved back to [place] and is currently residing with her "baby sister". Records were faxed to the [facility] in [place]. Patient still has an infusion pump on and is going to get it removed today at 2:15pm. I asked patient to ask the staff if they will mail the pump back to [place]. She states she will. Patient also states if she decides to she will pursue genetics at the clinic in [place].

## 2017-08-23 ENCOUNTER — Ambulatory Visit (HOSPITAL_COMMUNITY): Payer: Medicare Other

## 2017-08-30 ENCOUNTER — Ambulatory Visit (HOSPITAL_COMMUNITY): Payer: Medicare Other

## 2017-10-18 ENCOUNTER — Encounter (HOSPITAL_COMMUNITY): Payer: Self-pay

## 2017-10-18 ENCOUNTER — Ambulatory Visit (HOSPITAL_BASED_OUTPATIENT_CLINIC_OR_DEPARTMENT_OTHER): Payer: Medicare Other | Admitting: SURGICAL ONCOLOGY

## 2017-10-18 ENCOUNTER — Ambulatory Visit
Admission: RE | Admit: 2017-10-18 | Discharge: 2017-10-18 | Disposition: A | Discharge status: Home / Self Care | Payer: Medicare Other | Source: Ambulatory Visit | Attending: SURGICAL ONCOLOGY | Admitting: SURGICAL ONCOLOGY

## 2017-10-18 ENCOUNTER — Ambulatory Visit (HOSPITAL_BASED_OUTPATIENT_CLINIC_OR_DEPARTMENT_OTHER)
Admission: RE | Admit: 2017-10-18 | Discharge: 2017-10-18 | Disposition: A | Discharge status: Home / Self Care | Payer: Medicare Other | Source: Ambulatory Visit | Attending: Family | Admitting: Family

## 2017-10-18 ENCOUNTER — Ambulatory Visit (HOSPITAL_COMMUNITY)
Admission: RE | Admit: 2017-10-18 | Discharge: 2017-10-18 | Disposition: A | Discharge status: Home / Self Care | Payer: Medicare Other | Source: Ambulatory Visit

## 2017-10-18 VITALS — BP 138/81 | HR 76 | Temp 97.5°F | Resp 18 | Ht 61.0 in | Wt 182.3 lb

## 2017-10-18 VITALS — BP 124/88 | HR 82 | Temp 97.9°F | Ht 61.0 in | Wt 184.7 lb

## 2017-10-18 DIAGNOSIS — I493 Ventricular premature depolarization: Secondary | ICD-10-CM

## 2017-10-18 DIAGNOSIS — Z7982 Long term (current) use of aspirin: Secondary | ICD-10-CM | POA: Insufficient documentation

## 2017-10-18 DIAGNOSIS — Z923 Personal history of irradiation: Secondary | ICD-10-CM

## 2017-10-18 DIAGNOSIS — E1142 Type 2 diabetes mellitus with diabetic polyneuropathy: Secondary | ICD-10-CM | POA: Insufficient documentation

## 2017-10-18 DIAGNOSIS — C801 Malignant (primary) neoplasm, unspecified: Secondary | ICD-10-CM

## 2017-10-18 DIAGNOSIS — Z9989 Dependence on other enabling machines and devices: Secondary | ICD-10-CM

## 2017-10-18 DIAGNOSIS — I4949 Other premature depolarization: Secondary | ICD-10-CM

## 2017-10-18 DIAGNOSIS — F329 Major depressive disorder, single episode, unspecified: Secondary | ICD-10-CM | POA: Insufficient documentation

## 2017-10-18 DIAGNOSIS — E1329 Other specified diabetes mellitus with other diabetic kidney complication: Secondary | ICD-10-CM

## 2017-10-18 DIAGNOSIS — Z7984 Long term (current) use of oral hypoglycemic drugs: Secondary | ICD-10-CM | POA: Insufficient documentation

## 2017-10-18 DIAGNOSIS — I517 Cardiomegaly: Secondary | ICD-10-CM

## 2017-10-18 DIAGNOSIS — R9431 Abnormal electrocardiogram [ECG] [EKG]: Secondary | ICD-10-CM

## 2017-10-18 DIAGNOSIS — I10 Essential (primary) hypertension: Secondary | ICD-10-CM

## 2017-10-18 DIAGNOSIS — E119 Type 2 diabetes mellitus without complications: Secondary | ICD-10-CM

## 2017-10-18 DIAGNOSIS — C259 Malignant neoplasm of pancreas, unspecified: Secondary | ICD-10-CM | POA: Insufficient documentation

## 2017-10-18 DIAGNOSIS — E785 Hyperlipidemia, unspecified: Secondary | ICD-10-CM

## 2017-10-18 DIAGNOSIS — K219 Gastro-esophageal reflux disease without esophagitis: Secondary | ICD-10-CM | POA: Insufficient documentation

## 2017-10-18 DIAGNOSIS — Z79899 Other long term (current) drug therapy: Secondary | ICD-10-CM | POA: Insufficient documentation

## 2017-10-18 DIAGNOSIS — Z452 Encounter for adjustment and management of vascular access device: Secondary | ICD-10-CM

## 2017-10-18 DIAGNOSIS — G43909 Migraine, unspecified, not intractable, without status migrainosus: Secondary | ICD-10-CM

## 2017-10-18 DIAGNOSIS — J449 Chronic obstructive pulmonary disease, unspecified: Secondary | ICD-10-CM | POA: Insufficient documentation

## 2017-10-18 DIAGNOSIS — Z9221 Personal history of antineoplastic chemotherapy: Secondary | ICD-10-CM

## 2017-10-18 DIAGNOSIS — Z01818 Encounter for other preprocedural examination: Secondary | ICD-10-CM | POA: Insufficient documentation

## 2017-10-18 DIAGNOSIS — K469 Unspecified abdominal hernia without obstruction or gangrene: Secondary | ICD-10-CM

## 2017-10-18 DIAGNOSIS — Z882 Allergy status to sulfonamides status: Secondary | ICD-10-CM | POA: Insufficient documentation

## 2017-10-18 DIAGNOSIS — G629 Polyneuropathy, unspecified: Secondary | ICD-10-CM

## 2017-10-18 DIAGNOSIS — R634 Abnormal weight loss: Secondary | ICD-10-CM

## 2017-10-18 DIAGNOSIS — I4891 Unspecified atrial fibrillation: Secondary | ICD-10-CM | POA: Insufficient documentation

## 2017-10-18 HISTORY — DX: Dependence on other enabling machines and devices: Z99.89

## 2017-10-18 HISTORY — DX: Unspecified atrial fibrillation (CMS HCC): I48.91

## 2017-10-18 HISTORY — DX: Unspecified asthma, uncomplicated: J45.909

## 2017-10-18 HISTORY — DX: Edema, unspecified: R60.9

## 2017-10-18 HISTORY — DX: Malignant (primary) neoplasm, unspecified (CMS HCC): C80.1

## 2017-10-18 HISTORY — DX: Palpitations: R00.2

## 2017-10-18 HISTORY — DX: Chronic obstructive pulmonary disease, unspecified (CMS HCC): J44.9

## 2017-10-18 HISTORY — DX: Pure hypercholesterolemia, unspecified: E78.00

## 2017-10-18 HISTORY — DX: Gastro-esophageal reflux disease without esophagitis: K21.9

## 2017-10-18 HISTORY — DX: Heartburn: R12

## 2017-10-18 HISTORY — DX: Headache, unspecified: R51.9

## 2017-10-18 HISTORY — DX: Depression, unspecified: F32.A

## 2017-10-18 HISTORY — DX: Obesity, unspecified: E66.9

## 2017-10-18 HISTORY — DX: Other skin changes: R23.8

## 2017-10-18 HISTORY — DX: Sleep apnea, unspecified: G47.30

## 2017-10-18 HISTORY — DX: Panic disorder (episodic paroxysmal anxiety): F41.0

## 2017-10-18 HISTORY — DX: Unspecified abdominal hernia without obstruction or gangrene: K46.9

## 2017-10-18 HISTORY — DX: Presence of spectacles and contact lenses: Z97.3

## 2017-10-18 HISTORY — DX: Hyperlipidemia, unspecified: E78.5

## 2017-10-18 HISTORY — DX: Anxiety disorder, unspecified: F41.9

## 2017-10-18 HISTORY — DX: Essential (primary) hypertension: I10

## 2017-10-18 HISTORY — DX: Dorsopathy, unspecified: M53.9

## 2017-10-18 HISTORY — DX: Type 2 diabetes mellitus without complications (CMS HCC): E11.9

## 2017-10-18 HISTORY — DX: Migraine, unspecified, not intractable, without status migrainosus: G43.909

## 2017-10-18 HISTORY — DX: Unspecified osteoarthritis, unspecified site: M19.90

## 2017-10-18 HISTORY — DX: Abnormal weight loss: R63.4

## 2017-10-18 HISTORY — DX: Polyneuropathy, unspecified: G62.9

## 2017-10-18 LAB — BASIC METABOLIC PANEL
ANION GAP: 11 mmol/L (ref 4–13)
BUN/CREA RATIO: 13 (ref 6–22)
BUN: 10 mg/dL (ref 8–25)
CALCIUM: 9.7 mg/dL (ref 8.5–10.2)
CHLORIDE: 97 mmol/L (ref 96–111)
CO2 TOTAL: 27 mmol/L (ref 22–32)
CREATININE: 0.8 mg/dL (ref 0.49–1.10)
ESTIMATED GFR: >59 mL/min/1.73mˆ2 (ref >59)
GLUCOSE: 108 mg/dL (ref 65–139)
POTASSIUM: 3.2 mmol/L — ABNORMAL LOW (ref 3.5–5.1)
SODIUM: 135 mmol/L — ABNORMAL LOW (ref 136–145)

## 2017-10-18 LAB — CBC WITH DIFF
BASOPHIL #: 0.06 10*3/uL (ref 0.00–0.20)
BASOPHIL %: 1 %
EOSINOPHIL #: 0.03 10*3/uL (ref 0.00–0.50)
EOSINOPHIL %: 1 %
HCT: 36.9 % (ref 33.5–45.2)
HGB: 12.4 g/dL (ref 11.2–15.2)
LYMPHOCYTE #: 0.77 x10ˆ3/uL — ABNORMAL LOW (ref 1.00–4.80)
LYMPHOCYTE %: 14 %
MCH: 27.9 pg (ref 27.4–33.0)
MCHC: 33.6 g/dL (ref 32.5–35.8)
MCV: 83.1 fL (ref 78.0–100.0)
MONOCYTE #: 0.52 x10ˆ3/uL (ref 0.30–1.00)
MONOCYTE %: 10 %
MPV: 9.7 fL (ref 7.5–11.5)
MPV: 9.7 fL (ref 7.5–11.5)
NEUTROPHIL #: 4.02 10*3/uL (ref 1.50–7.70)
NEUTROPHIL %: 75 %
PLATELETS: 175 10*3/uL (ref 140–450)
RBC: 4.44 10*6/uL (ref 3.63–4.92)
RDW: 17.1 % — ABNORMAL HIGH (ref 12.0–15.0)
WBC: 5.4 10*3/uL (ref 3.5–11.0)

## 2017-10-18 LAB — ALT (SGPT): ALT (SGPT): 13 U/L (ref <55)

## 2017-10-18 LAB — TYPE AND SCREEN
ABO/RH(D): A POS
ANTIBODY SCREEN: NEGATIVE

## 2017-10-18 LAB — PREALBUMIN: PREALBUMIN: 22.6 mg/dL (ref 18.0–45.0)

## 2017-10-18 LAB — PT/INR: PROTHROMBIN TIME: 12.2 seconds (ref 9.3–13.9)

## 2017-10-18 LAB — PTT (PARTIAL THROMBOPLASTIN TIME): APTT: 30.4 seconds (ref 25.1–36.5)

## 2017-10-18 LAB — POC BLOOD GLUCOSE (RESULTS): GLUCOSE, POC: 125 mg/dL — ABNORMAL HIGH (ref 70–105)

## 2017-10-18 LAB — ALBUMIN: ALBUMIN: 3.6 g/dL (ref 3.4–4.8)

## 2017-10-18 LAB — AST (SGOT): AST (SGOT): 21 U/L (ref 8–41)

## 2017-10-18 LAB — CARBOHYDRATE ANTIGEN 19-9 (CA 19-9), SERUM: CARBOHYDRATE ANTIGEN 19-9: 109.5 U/mL — ABNORMAL HIGH (ref <37.0)

## 2017-10-18 NOTE — Anesthesia Preprocedure Evaluation (Addendum)
ANESTHESIA PRE-OP EVALUATION  Planned Procedure: XI ROBOT SUPPLY CARD (N/A )  PANCREATICODUODENECTOMY RADICAL (N/A )  Review of Systems         patient summary reviewed          Pulmonary   COPD,   Cardiovascular    Hypertension and peripheral edema        GI/Hepatic/Renal    GERD and well controlled     Endo/Other          Neuro/Psych/MS       Cancer               Physical Assessment      Patient summary reviewed   Airway       Mallampati: I    TM distance: >3 FB    Neck ROM: full  Mouth Opening: good.  No Facial hair  No Beard  No endotracheal tube present  No Tracheostomy present    Dental           (+) missing           Pulmonary    Breath sounds clear to auscultation  (-) no rhonchi, no decreased breath sounds, no wheezes, no rales and no stridor     Cardiovascular    Rhythm: regular  Rate: Normal  (+) peripheral edema present   (-) no friction rub, carotid bruit is not present and no murmur     Other findings            Plan  Planned anesthesia type: general    ASA 3     Intravenous induction     Anesthetic plan and risks discussed with patient.     Anesthesia issues/risks discussed are: Difficult Airway, Post-op Pain Management, Dental Injuries, Sore Throat, Post-op Agitation/Tantrum, Nerve Injuries, Failure of Block, Spinal Headache, Cardiac Events/MI, PONV, Stroke, Intraoperative Awareness/ Recall, Art Line Placement, Aspiration, Blood Loss, Central Line Placement and Post-op Cognitive Dysfunction.    Use of blood products discussed with patient whom consented to blood products.     Patient's NPO status is appropriate for Anesthesia.           Plan discussed with resident.                 EKG Ordered: 10/18/2017  CXR Ordered:  10/18/2017  Other Studies: Labs 10/18/2017      Consults: None    Patient instructed to take the following medications day of surgery, Lipitor, Zyrtec, Celexa, Neurontin, metoprolol, Singulair, Prilosec, and, if needed, Zofran, compazine, phenergan, hydrocodone and ativan.  Patient  instructed to hold losartan and glucotrol the morning of procedure.  Patient instructed to hold metformin 5 days prior to procedure, per service.  Patient instructed on treatment of hypoglycemia.  Patient instructed to avoid NSAIDs 7 days prior to procedure.  Patient provided anesthesia consent in PEC. Instructed to review consent prior to OR date. Educated that consent will be signed morning of surgery with anesthesiologist.       PT denies any problems with bleeding, and denies being on any blood thinners per pt. Pt states she sopped taking her baby aspirin on 10/18/17.

## 2017-10-18 NOTE — Nurses Notes (Signed)
The patient was evaluated and consented for surgery by Dr. Cyndi Bender on 10/18/17 . Patient was instructed to stop taking her metformin 5 days prior to surgery.  Patient was instructed about ERAS.  ERAS orders placed.  Patient instructed only to drink water due to being diabetic.  Patient instructed to contact her doctor who placed her on metformin and inform him we ask her to stop her metformin 5 days prior to surgery and make sure he does not want to place her on another medication. Instructed patient that she is scheduled for PAT today at 1530.  All questions were answered and the patient was encouraged to call our office if he has any additional questions or concerns.  Mila Homer, CLINICAL CARE COORDINATOR  10/18/2017, 15:16

## 2017-10-19 LAB — ECG 12-LEAD (PERFORMED IN PREADMISSION UNIT ONLY)
Atrial Rate: 75 {beats}/min
Atrial Rate: 75 {beats}/min
Calculated R Axis: -10 degrees
Calculated T Axis: 87 degrees
QRS Duration: 88 ms
QTC Calculation: 489 ms

## 2017-10-20 NOTE — H&P (Signed)
SURGICAL ONCOLOGY INITIAL EVALUATION:    Patient: [name]  D.O.B.: January 17, 1946  MRN# [id number]  Date of Service: 10/18/2017    Referring Provider: Charlotte Crumb, MD    PCP: No Pcp    Chief Complaint: Pancreatic cancer    HPI  [name] is a 71 y.o. female  who initially presented in with a 25 pound weight loss and epigastric pain.  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  An EUS guided biopsy revealed adenocarcinoma.  She was living in [place] at that time and was evaluated by Dr. Jyl Heinz at [facility] and recommended to undergo neoadjuvant treatment because of the vein involvement.  She was started on gem/Abraxane with fluorouracil in May of 2018 she completed 2 cycles of chemotherapy and her CA 19-9 decreased from 301-50.  She was then sent for chemo radiotherapy and completed 45 gray in 25 fractions.  She was on 5 FU for chemosensitization during that time.     She has since moved from [place] to [place] and now presents for another surgical opinion.    Overall she states she is doing fairly well.  She has been significantly fatigued with decreased activity since starting treatment, but reports she is slowly recovering.   She states she is tolerating oral intake without nausea or vomiting but does have decreased appetite.    She denies fever, chills, severe abdominal pain, or other complaints.   She is using a wheelchair intermittently but states she is  more active at home.    Allergies:  Allergies   Allergen Reactions   . Sulfa (Sulfonamides) Itching       Current Outpatient Prescriptions  Outpatient Prescriptions Marked as Taking for the 10/18/17 encounter (Office Visit) with Nicola Police, MD   Medication Sig   . aspirin (ECOTRIN) 81 mg Oral Tablet, Delayed Release (E.C.) Take 81 mg by mouth   . atorvastatin (LIPITOR) 10 mg Oral Tablet Take 10 mg by mouth   . cetirizine (ZYRTEC) 10 mg Oral Tablet Take 10 mg by mouth   .  citalopram (CELEXA) 20 mg Oral Tablet Take 20 mg by mouth   . furosemide (LASIX) 20 mg Oral Tablet 20 mg Once a day    . gabapentin (NEURONTIN) 300 mg Oral Capsule 300 mg Once a day    . glipiZIDE (GLUCOTROL XL) 10 mg Oral Tablet Extended Rel 24 hr (2) Take 10 mg by mouth   . hydroCHLOROthiazide (HYDRODIURIL) 25 mg Oral Tablet 25 mg Once a day    . losartan (COZAAR) 100 mg Oral Tablet Take 100 mg by mouth Once a day    . MetFORMIN (GLUCOPHAGE) 1,000 mg Oral Tablet 1,000 mg Twice daily with food    . metoprolol succinate (TOPROL-XL) 50 mg Oral Tablet Sustained Release 24 hr 50 mg Once a day    . montelukast (SINGULAIR) 10 mg Oral Tablet Take 10 mg by mouth   . omeprazole (PRILOSEC) 20 mg Oral Capsule, Delayed Release(E.C.) 20 mg Once a day    . ondansetron (ZOFRAN) 8 mg Oral Tablet Take 8 mg by mouth Every 8 hours as needed    . potassium chloride (K-DUR) 20 mEq Oral Tab Sust.Rel. Particle/Crystal Take 20 mEq by mouth   . prochlorperazine (COMPAZINE) 10 mg Oral Tablet Take 10 mg by mouth   . promethazine (PHENERGAN) 25 mg Oral Tablet Take 25 mg by mouth        Past  Medical History:    Past Medical History:   Diagnosis Date   . Abdominal hernia 10/18/2017    hx of repair   . Anxiety    . Arthritis    . Asthma    . Atrial fibrillation (CMS HCC)    . Back problem    . Bruises easily    . Cancer (CMS [facility]) 10/18/2017    chemo and radiation completed 09/24/2017   . COPD (chronic obstructive pulmonary disease) (CMS HCC)    . CPAP (continuous positive airway pressure) dependence 10/18/2017    has not used recently   . Depression    . DM (diabetes mellitus) (CMS HCC) 2000    Fasting BG 200's. HGA1C 2018 6   . Edema    . Essential hypertension    . GERD (gastroesophageal reflux disease)    . Headache    . Heartburn    . Hx antineoplastic chemo 2018   . Hx of radiation therapy 2018   . Hypercholesteremia    . Hyperlipidemia    . Migraine 10/18/2017    none recently   . Obesity    . Palpitations    . Pancreatic cancer (CMS [facility])  03/2017   . Panic attack    . Peripheral neuropathy 10/18/2017    knees down, bilateral   . Sleep apnea    . Type 2 diabetes mellitus (CMS HCC) 10/18/2017    Dx 2000 FBS 200s   . Unintentional weight loss 10/18/2017    25 lbs 03/2017   . Wears glasses      Past Surgical History:    Past Surgical History:   Procedure Laterality Date   . CESAREAN SECTION     . HX SUBCLAVIAN PORT IMPLANTION      s/p removed   . HX SUBCLAVIAN PORT IMPLANTION     . HX TONSIL AND ADENOIDECTOMY     . UMBILICAL HERNIA REPAIR      x2       Family History:  Family Medical History:     None        Social History:     Recently lost her husband to pancreatic cancer and relocated from [place] to [place].    Accompanied by her sister today's visit.  Denies alcohol or tobacco use.    ROS:  General: Denies fever, chills, or any changes in weight.  EENT: Denies blurry vision, tinnitus, nasal congestion, or trouble with swallowing.  Cardiovascular: Denies chest pain, pain on exertion, or palpitations.  Respiratory: Denies SOB, cough, or asthma/COPD/emphysema.  Gastrointestinal: Denies abdominal pain, N/V, constipation, or diarrhea. Denies any change in bowel movements or blood in stool.  GU: Denies dysuria, change in urinary habits, or hematuria.  Musculoskeletal: Denies weakness or joint pain. Denies trouble moving extremities.  Neurological: Denies HA, dizziness, hx of stroke.  Hematologic: Denies hx of bleeding disorders or blood clots.  Endocrine: Denies heat/cold intolerance, flushing, or change in glove/shoe size.  Psychiatric: Denies changes in mood, anxiety, or depression.    Objective:  BP 124/88  Pulse 82  Temp 36.6 C (97.9 F) (Thermal Scan)   Ht 1.549 m (5\' 1" )  Wt 83.8 kg (184 lb 11.9 oz)  SpO2 97% Comment: room air  BMI 34.91 kg/m2    Physical Exam:  Constitutional: Very pleasant, AAOx3, WDWN, NAD.  In a wheelchair.  HEENT: Normocephalic, atraumatic. PERRLA. No scleral icterus.  Neck: Trachea midline, supple. No  cervical or supraclavicular lymphadenopathy noted on palpation.  Cardiovascular: RRR  Pulmonary: No respiratory distress, no retractions, no wheezes, rales, or rhonchi observed. Normal respiratory effort.   Abdomen: Abdomen soft and supple; No tenderness. Non-distended; No masses or HSM present.   Extremities: No peripheral edema; No cyanosis or clubbing of nails.  Musculoskeletal: Normal muscle strength and tone of all four extremities.  Skin: No rashes or lesions present; Warm and dry. No jaundice.  Psychiatric: Normal mood and affect; Judgement and thought content normal.    Data:  Labs:  Ca 19-9: 109    Imaging studies:   CT scan demonstrates a mass in the head of the pancreas with a continued abutment of the portal vein.   There has not been a significant change in the size of the mass.   A PET-CT was performed however, there was minimal FDG uptake in the primary tumor.    Pathology:  Cytopathology read from [facility] in [place], [place] positive for malignant cells with adenocarcinoma.    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  She has completed  neoadjuvant chemotherapy with a nice biochemical response.  Additionally she completed radiation therapy, which ended on October 4th.    I discussed the potential role for surgical resection at this time.   This would involve a robotic assisted pancreaticoduodenectomy, possible open, possible portal vein resection.    We have maximized the neoadjuvant treatment she is able to take therefore this is the best opportunity to attempt resection.  The alternative would be to continue definitive treatment without plan for resection at a later date.  After the indications risks benefits and alternatives were discussed with the patient she agreed to pursue Whipple procedure.    We discussed the risk including perioperative mortality, and morbidity including MI, pneumonia, stroke,  pancreatic leak, delayed gastric emptying, worsening of her diabetes, in addition to many other potential complications.  After all questions were answered she signed informed consent.   typically we would like to perform surgery prior to 8 weeks after radiation to prevent the impact of fibrosis on the resection.  We will get her scheduled for the surgery as soon as possible to accommodate this time frame.  We will send her to the preoperative testing clinic today and order preoperative labs.    Lynwood Dawley, MD   [facility]  Division of Surgical Oncology

## 2017-10-25 ENCOUNTER — Inpatient Hospital Stay (HOSPITAL_COMMUNITY): Payer: Medicare Other | Admitting: SURGICAL ONCOLOGY

## 2017-10-25 ENCOUNTER — Ambulatory Visit: Payer: Medicare Other

## 2017-10-25 ENCOUNTER — Other Ambulatory Visit (HOSPITAL_COMMUNITY): Payer: Self-pay | Admitting: SURGICAL ONCOLOGY

## 2017-10-25 ENCOUNTER — Encounter (HOSPITAL_COMMUNITY): Payer: Self-pay

## 2017-10-25 ENCOUNTER — Inpatient Hospital Stay
Admission: RE | Admit: 2017-10-25 | Discharge: 2017-11-16 | DRG: 406 | Disposition: A | Discharge status: Home Health | Payer: Medicare Other | Source: Ambulatory Visit | Attending: SURGICAL ONCOLOGY | Admitting: SURGICAL ONCOLOGY

## 2017-10-25 ENCOUNTER — Inpatient Hospital Stay (HOSPITAL_COMMUNITY): Payer: Medicare Other | Admitting: Family

## 2017-10-25 ENCOUNTER — Encounter (HOSPITAL_COMMUNITY)
Admission: RE | Disposition: A | Discharge status: Home Health | Payer: Self-pay | Source: Ambulatory Visit | Attending: SURGICAL ONCOLOGY

## 2017-10-25 ENCOUNTER — Inpatient Hospital Stay (HOSPITAL_COMMUNITY): Payer: Medicare Other | Admitting: ANESTHESIOLOGY

## 2017-10-25 DIAGNOSIS — C259 Malignant neoplasm of pancreas, unspecified: Secondary | ICD-10-CM | POA: Diagnosis present

## 2017-10-25 DIAGNOSIS — R109 Unspecified abdominal pain: Secondary | ICD-10-CM

## 2017-10-25 DIAGNOSIS — K8020 Calculus of gallbladder without cholecystitis without obstruction: Secondary | ICD-10-CM

## 2017-10-25 DIAGNOSIS — I499 Cardiac arrhythmia, unspecified: Secondary | ICD-10-CM

## 2017-10-25 DIAGNOSIS — I2510 Atherosclerotic heart disease of native coronary artery without angina pectoris: Secondary | ICD-10-CM | POA: Diagnosis present

## 2017-10-25 DIAGNOSIS — Z79899 Other long term (current) drug therapy: Secondary | ICD-10-CM

## 2017-10-25 DIAGNOSIS — Z7984 Long term (current) use of oral hypoglycemic drugs: Secondary | ICD-10-CM

## 2017-10-25 DIAGNOSIS — E1142 Type 2 diabetes mellitus with diabetic polyneuropathy: Secondary | ICD-10-CM | POA: Diagnosis present

## 2017-10-25 DIAGNOSIS — Z9981 Dependence on supplemental oxygen: Secondary | ICD-10-CM

## 2017-10-25 DIAGNOSIS — E871 Hypo-osmolality and hyponatremia: Secondary | ICD-10-CM | POA: Diagnosis not present

## 2017-10-25 DIAGNOSIS — E669 Obesity, unspecified: Secondary | ICD-10-CM | POA: Diagnosis present

## 2017-10-25 DIAGNOSIS — Z9221 Personal history of antineoplastic chemotherapy: Secondary | ICD-10-CM

## 2017-10-25 DIAGNOSIS — K219 Gastro-esophageal reflux disease without esophagitis: Secondary | ICD-10-CM | POA: Diagnosis present

## 2017-10-25 DIAGNOSIS — I10 Essential (primary) hypertension: Secondary | ICD-10-CM | POA: Diagnosis present

## 2017-10-25 DIAGNOSIS — C250 Malignant neoplasm of head of pancreas: Principal | ICD-10-CM | POA: Diagnosis present

## 2017-10-25 DIAGNOSIS — J449 Chronic obstructive pulmonary disease, unspecified: Secondary | ICD-10-CM | POA: Diagnosis present

## 2017-10-25 DIAGNOSIS — I472 Ventricular tachycardia: Secondary | ICD-10-CM | POA: Diagnosis not present

## 2017-10-25 DIAGNOSIS — I4891 Unspecified atrial fibrillation: Secondary | ICD-10-CM | POA: Diagnosis present

## 2017-10-25 DIAGNOSIS — Z87891 Personal history of nicotine dependence: Secondary | ICD-10-CM

## 2017-10-25 DIAGNOSIS — F329 Major depressive disorder, single episode, unspecified: Secondary | ICD-10-CM | POA: Diagnosis present

## 2017-10-25 DIAGNOSIS — I248 Other forms of acute ischemic heart disease: Secondary | ICD-10-CM | POA: Diagnosis not present

## 2017-10-25 DIAGNOSIS — F410 Panic disorder [episodic paroxysmal anxiety] without agoraphobia: Secondary | ICD-10-CM | POA: Diagnosis present

## 2017-10-25 DIAGNOSIS — E785 Hyperlipidemia, unspecified: Secondary | ICD-10-CM | POA: Diagnosis present

## 2017-10-25 DIAGNOSIS — Z923 Personal history of irradiation: Secondary | ICD-10-CM

## 2017-10-25 DIAGNOSIS — K824 Cholesterolosis of gallbladder: Secondary | ICD-10-CM

## 2017-10-25 DIAGNOSIS — Z7982 Long term (current) use of aspirin: Secondary | ICD-10-CM

## 2017-10-25 DIAGNOSIS — Z6841 Body Mass Index (BMI) 40.0 and over, adult: Secondary | ICD-10-CM

## 2017-10-25 DIAGNOSIS — E1165 Type 2 diabetes mellitus with hyperglycemia: Secondary | ICD-10-CM | POA: Diagnosis not present

## 2017-10-25 DIAGNOSIS — G8918 Other acute postprocedural pain: Secondary | ICD-10-CM

## 2017-10-25 DIAGNOSIS — G4730 Sleep apnea, unspecified: Secondary | ICD-10-CM | POA: Diagnosis present

## 2017-10-25 LAB — ARTERIAL BLOOD GAS/LACTATE/CO-OX/LYTES (NA/K/CA/CL/GLUC) (TEMP COMP)
%FIO2 (ARTERIAL): 64 %
%FIO2 (ARTERIAL): 38 %
%FIO2 (ARTERIAL): 52 %
%FIO2 (ARTERIAL): 50 %
(T) PCO2: 40 mm/Hg (ref 35.0–45.0)
(T) PCO2: 45 mm/Hg (ref 35.0–45.0)
(T) PCO2: 43 mm/Hg (ref 35.0–45.0)
(T) PCO2: 43 mm/Hg (ref 35.0–45.0)
(T) PO2: 271 mm/Hg — ABNORMAL HIGH (ref 72.0–100.0)
(T) PO2: 190 mm/Hg — ABNORMAL HIGH (ref 72.0–100.0)
(T) PO2: 249 mm/Hg — ABNORMAL HIGH (ref 72.0–100.0)
(T) PO2: 211 mm/Hg — ABNORMAL HIGH (ref 72.0–100.0)
BASE DEFICIT: 1.2 mmol/L (ref 0.0–3.0)
BASE DEFICIT: 2.5 mmol/L (ref 0.0–3.0)
BASE EXCESS (ARTERIAL): 2.8 mmol/L — ABNORMAL HIGH (ref 0.0–1.0)
BASE EXCESS (ARTERIAL): 1.1 mmol/L — ABNORMAL HIGH (ref 0.0–1.0)
BICARBONATE (ARTERIAL): 27.1 mmol/L — ABNORMAL HIGH (ref 18.0–26.0)
BICARBONATE (ARTERIAL): 25.8 mmol/L (ref 18.0–26.0)
BICARBONATE (ARTERIAL): 24 mmol/L (ref 18.0–26.0)
BICARBONATE (ARTERIAL): 23 mmol/L (ref 18.0–26.0)
CARBOXYHEMOGLOBIN: 0.9 % (ref 0.0–2.5)
CARBOXYHEMOGLOBIN: 0.5 % (ref 0.0–2.5)
CARBOXYHEMOGLOBIN: 1 % (ref 0.0–2.5)
CARBOXYHEMOGLOBIN: 0.7 % (ref 0.0–2.5)
CHLORIDE: 94 mmol/L — ABNORMAL LOW (ref 96–111)
CHLORIDE: 96 mmol/L (ref 96–111)
CHLORIDE: 96 mmol/L (ref 96–111)
CHLORIDE: 96 mmol/L (ref 96–111)
GLUCOSE: 187 mg/dL — ABNORMAL HIGH (ref 60–105)
GLUCOSE: 220 mg/dL — ABNORMAL HIGH (ref 60–105)
GLUCOSE: 176 mg/dL — ABNORMAL HIGH (ref 60–105)
GLUCOSE: 207 mg/dL — ABNORMAL HIGH (ref 60–105)
HEMOGLOBIN: 10.3 g/dL — ABNORMAL LOW (ref 12.0–18.0)
HEMOGLOBIN: 10.6 g/dL — ABNORMAL LOW (ref 12.0–18.0)
HEMOGLOBIN: 11.1 g/dL — ABNORMAL LOW (ref 12.0–18.0)
HEMOGLOBIN: 10.1 g/dL — ABNORMAL LOW (ref 12.0–18.0)
IONIZED CALCIUM: 1.15 mmol/L (ref 1.10–1.30)
IONIZED CALCIUM: 1.17 mmol/L (ref 1.10–1.30)
IONIZED CALCIUM: 1.16 mmol/L (ref 1.10–1.30)
IONIZED CALCIUM: 1.13 mmol/L (ref 1.10–1.30)
LACTATE: 1.9 mmol/L — ABNORMAL HIGH (ref 0.0–1.3)
LACTATE: 1.5 mmol/L — ABNORMAL HIGH (ref 0.0–1.3)
LACTATE: 2.8 mmol/L — ABNORMAL HIGH (ref 0.0–1.3)
LACTATE: 3.1 mmol/L — ABNORMAL HIGH (ref 0.0–1.3)
MET-HEMOGLOBIN: 0.9 % (ref 0.0–3.5)
MET-HEMOGLOBIN: 1.1 % (ref 0.0–3.5)
MET-HEMOGLOBIN: 0.9 % (ref 0.0–3.5)
MET-HEMOGLOBIN: 0.5 % (ref 0.0–3.5)
O2CT: 14.7 % — ABNORMAL LOW (ref 15.7–24.3)
O2CT: 14.8 % — ABNORMAL LOW (ref 15.7–24.3)
O2CT: 15.7 % (ref 15.7–24.3)
O2CT: 14.2 % — ABNORMAL LOW (ref 15.7–24.3)
OXYHEMOGLOBIN: 97 % (ref 85.0–98.0)
OXYHEMOGLOBIN: 96.4 % (ref 85.0–98.0)
OXYHEMOGLOBIN: 96.7 % (ref 85.0–98.0)
OXYHEMOGLOBIN: 96.5 % (ref 85.0–98.0)
PAO2/FIO2 RATIO: 423 (ref <=200)
PAO2/FIO2 RATIO: 500 (ref <=200)
PAO2/FIO2 RATIO: 479 (ref <=200)
PAO2/FIO2 RATIO: 422 (ref <=200)
PCO2 (ARTERIAL): 40 mm/Hg (ref 35.0–45.0)
PCO2 (ARTERIAL): 45 mm/Hg (ref 35.0–45.0)
PCO2 (ARTERIAL): 43 mm/Hg (ref 35.0–45.0)
PCO2 (ARTERIAL): 43 mm/Hg (ref 35.0–45.0)
PH (ARTERIAL): 7.44 (ref 7.35–7.45)
PH (ARTERIAL): 7.38 (ref 7.35–7.45)
PH (ARTERIAL): 7.36 (ref 7.35–7.45)
PH (ARTERIAL): 7.34 — ABNORMAL LOW (ref 7.35–7.45)
PH (T): 7.44 (ref 7.35–7.45)
PH (T): 7.38 (ref 7.35–7.45)
PH (T): 7.38 (ref 7.35–7.45)
PH (T): 7.36 (ref 7.35–7.45)
PH (T): 7.34 — ABNORMAL LOW (ref 7.35–7.45)
PO2 (ARTERIAL): 271 mm/Hg — ABNORMAL HIGH (ref 72.0–100.0)
PO2 (ARTERIAL): 271 mm/Hg — ABNORMAL HIGH (ref 72.0–100.0)
PO2 (ARTERIAL): 190 mm/Hg — ABNORMAL HIGH (ref 72.0–100.0)
PO2 (ARTERIAL): 249 mm/Hg — ABNORMAL HIGH (ref 72.0–100.0)
PO2 (ARTERIAL): 211 mm/Hg — ABNORMAL HIGH (ref 72.0–100.0)
SODIUM: 127 mmol/L — ABNORMAL LOW (ref 136–145)
SODIUM: 127 mmol/L — ABNORMAL LOW (ref 136–145)
SODIUM: 128 mmol/L — ABNORMAL LOW (ref 136–145)
SODIUM: 129 mmol/L — ABNORMAL LOW (ref 136–145)
TEMPERATURE, COMP: 37 C (ref 15.0–40.0)
TEMPERATURE, COMP: 37 C (ref 15.0–40.0)
TEMPERATURE, COMP: 37 C (ref 15.0–40.0)
TEMPERATURE, COMP: 37 C (ref 15.0–40.0)
TEMPERATURE, COMP: 37 C (ref 15.0–40.0)
TEMPERATURE, COMP: 37 C (ref 15.0–40.0)
WHOLE BLOOD POTASSIUM: 2.6 mmol/L — CL (ref 3.5–5.0)
WHOLE BLOOD POTASSIUM: 3.2 mmol/L — ABNORMAL LOW (ref 3.5–5.0)
WHOLE BLOOD POTASSIUM: 3.3 mmol/L — ABNORMAL LOW (ref 3.5–5.0)
WHOLE BLOOD POTASSIUM: 3.3 mmol/L — ABNORMAL LOW (ref 3.5–5.0)
WHOLE BLOOD POTASSIUM: 3.4 mmol/L — ABNORMAL LOW (ref 3.5–5.0)

## 2017-10-25 LAB — POC BLOOD GLUCOSE (RESULTS)
GLUCOSE, POC: 223 mg/dL — ABNORMAL HIGH (ref 70–105)
GLUCOSE, POC: 264 mg/dL — ABNORMAL HIGH (ref 70–105)
GLUCOSE, POC: 289 mg/dL — ABNORMAL HIGH (ref 70–105)

## 2017-10-25 SURGERY — ROBOTIC WHIPPLE
Anesthesia: General | Wound class: Clean Wound: Uninfected operative wounds in which no inflammation occurred

## 2017-10-25 MED ORDER — SODIUM CHLORIDE 0.9 % (FLUSH) INJECTION SYRINGE
20.0000 mL | INJECTION | Freq: Once | INTRAMUSCULAR | Status: DC | PRN
Start: 2017-10-25 — End: 2017-10-25

## 2017-10-25 MED ORDER — SODIUM CHLORIDE 0.9 % INTRAVENOUS SOLUTION
INTRAVENOUS | Status: DC | PRN
Start: 2017-10-25 — End: 2017-10-25

## 2017-10-25 MED ORDER — POTASSIUM CHLORIDE 40 MEQ/100ML IN STERILE WATER INTRAVENOUS PIGGYBACK
40.00 meq | INJECTION | INTRAVENOUS | Status: DC
Start: 2017-10-25 — End: 2017-10-25

## 2017-10-25 MED ORDER — OXYCODONE 5 MG TABLET
10.0000 mg | ORAL_TABLET | ORAL | Status: DC | PRN
Start: 2017-10-25 — End: 2017-10-31
  Administered 2017-10-26 – 2017-10-31 (×9): 10 mg via ORAL
  Filled 2017-10-25 (×9): qty 2

## 2017-10-25 MED ORDER — ROCURONIUM 10 MG/ML INTRAVENOUS SOLUTION
Freq: Once | INTRAVENOUS | Status: DC | PRN
Start: 2017-10-25 — End: 2017-10-25
  Administered 2017-10-25: 10 mg via INTRAVENOUS
  Administered 2017-10-25: 20 mg via INTRAVENOUS
  Administered 2017-10-25: 50 mg via INTRAVENOUS
  Administered 2017-10-25: 20 mg via INTRAVENOUS
  Administered 2017-10-25: 10 mg via INTRAVENOUS

## 2017-10-25 MED ORDER — OXYCODONE 5 MG TABLET
5.0000 mg | ORAL_TABLET | ORAL | Status: DC | PRN
Start: 2017-10-25 — End: 2017-10-31
  Administered 2017-10-26 – 2017-10-28 (×5): 5 mg via ORAL
  Filled 2017-10-25 (×6): qty 1

## 2017-10-25 MED ORDER — NITROGLYCERIN 50 MCG/ML IV DILUTION - FOR ANES
INJECTION | Freq: Once | INTRAVENOUS | Status: DC | PRN
Start: 2017-10-25 — End: 2017-10-25
  Administered 2017-10-25: 30 ug via INTRAVENOUS
  Administered 2017-10-25: 20 ug via INTRAVENOUS

## 2017-10-25 MED ORDER — SODIUM CHLORIDE 0.9 % (FLUSH) INJECTION SYRINGE
2.0000 mL | INJECTION | Freq: Three times a day (TID) | INTRAMUSCULAR | Status: DC
Start: 2017-10-25 — End: 2017-11-16
  Administered 2017-10-25 – 2017-10-27 (×4): 2 mL
  Administered 2017-10-27 – 2017-10-28 (×2): 0 mL
  Administered 2017-10-28 (×2): 2 mL
  Administered 2017-10-29: 0 mL
  Administered 2017-10-29: 2 mL
  Administered 2017-10-30 (×2): 0 mL
  Administered 2017-10-30 – 2017-10-31 (×3): 2 mL
  Administered 2017-10-31: 10 mL
  Administered 2017-11-01: 0 mL
  Administered 2017-11-01: 2 mL
  Administered 2017-11-01 – 2017-11-02 (×2): 0 mL
  Administered 2017-11-02: 2 mL
  Administered 2017-11-02: 0 mL
  Administered 2017-11-03 – 2017-11-04 (×5): 2 mL
  Administered 2017-11-05: 0 mL
  Administered 2017-11-05 – 2017-11-06 (×4): 2 mL
  Administered 2017-11-06: 0 mL
  Administered 2017-11-07: 2 mL
  Administered 2017-11-07: 0 mL
  Administered 2017-11-07: 10 mL
  Administered 2017-11-08: 0 mL
  Administered 2017-11-08: 2 mL
  Administered 2017-11-08: 10 mL
  Administered 2017-11-09: 0 mL
  Administered 2017-11-09 – 2017-11-10 (×2): 2 mL
  Administered 2017-11-10 – 2017-11-11 (×3): 0 mL
  Administered 2017-11-11 – 2017-11-12 (×3): 2 mL
  Administered 2017-11-12 – 2017-11-13 (×4): 0 mL
  Administered 2017-11-14: 2 mL
  Administered 2017-11-14 – 2017-11-15 (×2): 0 mL
  Administered 2017-11-15: 2 mL
  Administered 2017-11-15 – 2017-11-16 (×2): 0 mL

## 2017-10-25 MED ORDER — MIDAZOLAM 1 MG/ML INJECTION SOLUTION
INTRAMUSCULAR | Status: AC
Start: 2017-10-25 — End: 2017-10-25
  Filled 2017-10-25: qty 2

## 2017-10-25 MED ORDER — INSULIN LISPRO 100 UNIT/ML SUB-Q SSIP
3.00 [IU] | INJECTION | Freq: Four times a day (QID) | SUBCUTANEOUS | Status: DC | PRN
Start: 2017-10-25 — End: 2017-11-02
  Administered 2017-10-25 – 2017-10-26 (×2): 9 [IU] via SUBCUTANEOUS
  Administered 2017-10-26: 6 [IU] via SUBCUTANEOUS
  Administered 2017-10-26: 9 [IU] via SUBCUTANEOUS
  Administered 2017-10-26 – 2017-10-27 (×2): 6 [IU] via SUBCUTANEOUS
  Administered 2017-10-27: 9 [IU] via SUBCUTANEOUS
  Administered 2017-10-27: 6 [IU] via SUBCUTANEOUS
  Administered 2017-10-28 (×4): 9 [IU] via SUBCUTANEOUS
  Administered 2017-10-29: 0 [IU] via SUBCUTANEOUS
  Administered 2017-10-29: 9 [IU] via SUBCUTANEOUS
  Administered 2017-10-29 (×2): 6 [IU] via SUBCUTANEOUS
  Administered 2017-10-30: 3 [IU] via SUBCUTANEOUS
  Administered 2017-10-30: 9 [IU] via SUBCUTANEOUS
  Administered 2017-10-30 – 2017-10-31 (×2): 6 [IU] via SUBCUTANEOUS
  Administered 2017-10-31: 3 [IU] via SUBCUTANEOUS
  Administered 2017-11-01 – 2017-11-02 (×3): 9 [IU] via SUBCUTANEOUS
  Filled 2017-10-25: qty 3

## 2017-10-25 MED ORDER — MIDAZOLAM 1 MG/ML INJECTION SOLUTION
Freq: Once | INTRAMUSCULAR | Status: DC | PRN
Start: 2017-10-25 — End: 2017-10-25
  Administered 2017-10-25: 2 mg via INTRAVENOUS

## 2017-10-25 MED ORDER — INSULIN REGULAR HUMAN 100 UNIT/ML INJ FOR MIXTURES -RX ADMIN UNIT ROUNDS TO NEAREST 0.01 ML
Freq: Once | INTRAMUSCULAR | Status: DC | PRN
Start: 2017-10-25 — End: 2017-10-25
  Administered 2017-10-25: 4 [IU] via INTRAVENOUS
  Administered 2017-10-25: 13:00:00 2 [IU] via INTRAVENOUS
  Administered 2017-10-25: 15:00:00 4 [IU] via INTRAVENOUS

## 2017-10-25 MED ORDER — ONDANSETRON HCL (PF) 4 MG/2 ML INJECTION SOLUTION
4.0000 mg | Freq: Four times a day (QID) | INTRAMUSCULAR | Status: DC | PRN
Start: 2017-10-25 — End: 2017-11-16
  Administered 2017-10-27 – 2017-11-15 (×11): 4 mg via INTRAVENOUS
  Filled 2017-10-25 (×13): qty 2

## 2017-10-25 MED ORDER — DEXMEDETOMIDINE 4 MCG/ML IV DILUTION
Freq: Once | INTRAMUSCULAR | Status: DC | PRN
Start: 2017-10-25 — End: 2017-10-25
  Administered 2017-10-25: 10:00:00 16 ug via INTRAVENOUS
  Administered 2017-10-25: 13:00:00 12 ug via INTRAVENOUS

## 2017-10-25 MED ORDER — HYDROMORPHONE 2 MG/ML INJECTION SYRINGE
0.4000 mg | INJECTION | INTRAMUSCULAR | Status: DC | PRN
Start: 2017-10-25 — End: 2017-10-25

## 2017-10-25 MED ORDER — ACETAMINOPHEN 325 MG TABLET
975.0000 mg | ORAL_TABLET | Freq: Once | ORAL | Status: AC
Start: 2017-10-25 — End: 2017-10-25
  Administered 2017-10-25: 975 mg via ORAL
  Filled 2017-10-25: qty 3

## 2017-10-25 MED ORDER — ENOXAPARIN 40 MG/0.4 ML SUBCUTANEOUS SYRINGE
40.0000 mg | INJECTION | SUBCUTANEOUS | Status: DC
Start: 2017-10-26 — End: 2017-11-16
  Administered 2017-10-26 – 2017-11-16 (×21): 40 mg via SUBCUTANEOUS
  Filled 2017-10-25 (×24): qty 0.4

## 2017-10-25 MED ORDER — PROPOFOL 10 MG/ML IV BOLUS
INJECTION | Freq: Once | INTRAVENOUS | Status: DC | PRN
Start: 2017-10-25 — End: 2017-10-25
  Administered 2017-10-25: 20 mg via INTRAVENOUS
  Administered 2017-10-25: 50 mg via INTRAVENOUS
  Administered 2017-10-25: 100 mg via INTRAVENOUS
  Administered 2017-10-25: 30 mg via INTRAVENOUS
  Administered 2017-10-25: 25 mg via INTRAVENOUS

## 2017-10-25 MED ORDER — HEPARIN (PORCINE) 5,000 UNIT/ML INJECTION SOLUTION
5000.0000 [IU] | Freq: Once | INTRAMUSCULAR | Status: AC
Start: 2017-10-25 — End: 2017-10-25
  Administered 2017-10-25: 5000 [IU] via SUBCUTANEOUS
  Filled 2017-10-25: qty 1

## 2017-10-25 MED ORDER — LACTATED RINGERS INTRAVENOUS SOLUTION
INTRAVENOUS | Status: DC
Start: 2017-10-25 — End: 2017-10-25

## 2017-10-25 MED ORDER — KETAMINE 10 MG/ML INJECTION WRAPPER
Freq: Once | INTRAMUSCULAR | Status: DC | PRN
Start: 2017-10-25 — End: 2017-10-25
  Administered 2017-10-25: 15 mg via INTRAVENOUS
  Administered 2017-10-25: 30 mg via INTRAVENOUS

## 2017-10-25 MED ORDER — ACETAMINOPHEN 1,000 MG/100 ML (10 MG/ML) INTRAVENOUS SOLUTION
Freq: Once | INTRAVENOUS | Status: DC | PRN
Start: 2017-10-25 — End: 2017-10-25
  Administered 2017-10-25: 1000 mg via INTRAVENOUS

## 2017-10-25 MED ORDER — SODIUM CHLORIDE 0.9 % (FLUSH) INJECTION SYRINGE
2.0000 mL | INJECTION | Freq: Three times a day (TID) | INTRAMUSCULAR | Status: DC
Start: 2017-10-25 — End: 2017-10-25

## 2017-10-25 MED ORDER — PHENYLEPHRINE 0.5 MG/5 ML (100 MCG/ML)IN 0.9 % SOD.CHLORIDE IV SYRINGE
INJECTION | Freq: Once | INTRAVENOUS | Status: DC | PRN
Start: 2017-10-25 — End: 2017-10-25
  Administered 2017-10-25: 50 ug via INTRAVENOUS

## 2017-10-25 MED ORDER — LIDOCAINE 1 %-EPINEPHRINE 1:100,000 INJECTION SOLUTION
30.0000 mL | Freq: Once | INTRAMUSCULAR | Status: DC
Start: 2017-10-25 — End: 2017-10-25

## 2017-10-25 MED ORDER — PHENYLEPHRINE 60MG IN NS 250ML INFUSION - FOR ANES
INTRAVENOUS | Status: DC | PRN
Start: 2017-10-25 — End: 2017-10-25
  Administered 2017-10-25: .1 ug/kg/min via INTRAVENOUS
  Administered 2017-10-25: 0 ug/kg/min via INTRAVENOUS
  Administered 2017-10-25: .05 ug/kg/min via INTRAVENOUS
  Administered 2017-10-25: .2 ug/kg/min via INTRAVENOUS

## 2017-10-25 MED ORDER — ALBUMIN, HUMAN 5 % INTRAVENOUS SOLUTION
INTRAVENOUS | Status: DC | PRN
Start: 2017-10-25 — End: 2017-10-25

## 2017-10-25 MED ORDER — SODIUM CHLORIDE 0.9 % (FLUSH) INJECTION SYRINGE
2.0000 mL | INJECTION | INTRAMUSCULAR | Status: DC | PRN
Start: 2017-10-25 — End: 2017-11-16
  Administered 2017-10-31: 5 mL
  Filled 2017-10-25: qty 10

## 2017-10-25 MED ORDER — FENTANYL (PF) 50 MCG/ML INJECTION SOLUTION
Freq: Once | INTRAMUSCULAR | Status: DC | PRN
Start: 2017-10-25 — End: 2017-10-25
  Administered 2017-10-25: 100 ug via INTRAVENOUS
  Administered 2017-10-25: 50 ug via INTRAVENOUS
  Administered 2017-10-25: 100 ug via INTRAVENOUS

## 2017-10-25 MED ORDER — SODIUM CHLORIDE 0.9 % (FLUSH) INJECTION SYRINGE
2.0000 mL | INJECTION | INTRAMUSCULAR | Status: DC | PRN
Start: 2017-10-25 — End: 2017-10-25

## 2017-10-25 MED ORDER — OXYCODONE-ACETAMINOPHEN 5 MG-325 MG TABLET
1.0000 | ORAL_TABLET | Freq: Once | ORAL | Status: DC | PRN
Start: 2017-10-25 — End: 2017-10-25

## 2017-10-25 MED ORDER — SODIUM CHLORIDE 0.9 % INTRAVENOUS SOLUTION
1000.0000 mg | Freq: Once | INTRAVENOUS | Status: AC
Start: 2017-10-25 — End: 2017-10-25
  Administered 2017-10-25: 1000 mg via INTRAVENOUS
  Administered 2017-10-25: 0 mg via INTRAVENOUS
  Filled 2017-10-25: qty 10

## 2017-10-25 MED ORDER — SODIUM CHLORIDE 0.9 % (FLUSH) INJECTION SYRINGE
2.00 mL | INJECTION | INTRAMUSCULAR | Status: DC | PRN
Start: 2017-10-25 — End: 2017-10-25

## 2017-10-25 MED ORDER — SODIUM CHLORIDE 0.9 % INTRAVENOUS SOLUTION
INTRAVENOUS | Status: DC
Start: 2017-10-25 — End: 2017-10-27

## 2017-10-25 MED ORDER — INSULIN REGULAR HUMAN 100 UNIT/ML INJECTION SSIP
2.00 [IU] | INJECTION | Freq: Four times a day (QID) | SUBCUTANEOUS | Status: DC | PRN
Start: 2017-10-25 — End: 2017-10-25

## 2017-10-25 MED ORDER — MIDAZOLAM 1 MG/ML INJECTION SOLUTION
2.0000 mg | Freq: Once | INTRAMUSCULAR | Status: DC | PRN
Start: 2017-10-25 — End: 2017-10-25
  Administered 2017-10-25: 1 mg via INTRAVENOUS

## 2017-10-25 MED ORDER — SODIUM CHLORIDE 0.9 % (FLUSH) INJECTION SYRINGE
2.00 mL | INJECTION | Freq: Three times a day (TID) | INTRAMUSCULAR | Status: DC
Start: 2017-10-25 — End: 2017-10-25
  Administered 2017-11-08: 09:00:00 via ORAL

## 2017-10-25 MED ORDER — POTASSIUM CHLORIDE 20 MEQ/50 ML IN STERILE WATER INTRAVENOUS PIGGYBACK
20.0000 meq | INJECTION | INTRAVENOUS | Status: AC
Start: 2017-10-25 — End: 2017-10-25
  Administered 2017-10-25 (×2): 20 meq via INTRAVENOUS
  Filled 2017-10-25 (×2): qty 50

## 2017-10-25 MED ORDER — MORPHINE (PF) 1 MG/ML INJECTION SOLUTION
0.1500 mg | Freq: Once | INTRAMUSCULAR | Status: DC
Start: 2017-10-25 — End: 2017-10-25

## 2017-10-25 MED ORDER — SODIUM CHLORIDE 0.9 % (FLUSH) INJECTION SYRINGE
2.00 mL | INJECTION | Freq: Three times a day (TID) | INTRAMUSCULAR | Status: DC
Start: 2017-10-25 — End: 2017-10-25

## 2017-10-25 MED ORDER — CITALOPRAM 20 MG TABLET
20.0000 mg | ORAL_TABLET | Freq: Every day | ORAL | Status: DC
Start: 2017-10-26 — End: 2017-11-10
  Administered 2017-10-26 – 2017-11-10 (×16): 20 mg via ORAL
  Filled 2017-10-25 (×17): qty 1

## 2017-10-25 MED ORDER — DEXAMETHASONE SODIUM PHOSPHATE 4 MG/ML INJECTION SOLUTION
Freq: Once | INTRAMUSCULAR | Status: DC | PRN
Start: 2017-10-25 — End: 2017-10-25
  Administered 2017-10-25: 8 mg via INTRAVENOUS

## 2017-10-25 MED ORDER — LORAZEPAM 0.5 MG TABLET
0.50 mg | ORAL_TABLET | Freq: Every day | ORAL | Status: DC | PRN
Start: 2017-10-25 — End: 2017-11-16
  Administered 2017-11-02 – 2017-11-05 (×4): 0.5 mg via ORAL
  Filled 2017-10-25 (×3): qty 1

## 2017-10-25 MED ORDER — ONDANSETRON HCL (PF) 4 MG/2 ML INJECTION SOLUTION
Freq: Once | INTRAMUSCULAR | Status: DC | PRN
Start: 2017-10-25 — End: 2017-10-25
  Administered 2017-10-25: 4 mg via INTRAVENOUS

## 2017-10-25 MED ORDER — MORPHINE (PF) 1 MG/ML INJECTION SOLUTION
0.1500 mg | Freq: Once | INTRAMUSCULAR | Status: AC
Start: 2017-10-25 — End: 2017-10-25
  Administered 2017-10-25 (×2): 0.15 mg via INTRATHECAL
  Filled 2017-10-25: qty 0.15

## 2017-10-25 MED ORDER — LIDOCAINE (PF) 100 MG/5 ML (2 %) INTRAVENOUS SYRINGE
INJECTION | Freq: Once | INTRAVENOUS | Status: DC | PRN
Start: 2017-10-25 — End: 2017-10-25
  Administered 2017-10-25: 100 mg via INTRAVENOUS

## 2017-10-25 MED ORDER — ACETAMINOPHEN 1,000 MG/100 ML (10 MG/ML) INTRAVENOUS SOLUTION
1000.00 mg | Freq: Three times a day (TID) | INTRAVENOUS | Status: AC
Start: 2017-10-25 — End: 2017-10-26
  Administered 2017-10-25: 0 mg via INTRAVENOUS
  Administered 2017-10-25 – 2017-10-26 (×2): 1000 mg via INTRAVENOUS
  Administered 2017-10-26: 0 mg via INTRAVENOUS
  Administered 2017-10-26: 1000 mg via INTRAVENOUS
  Administered 2017-10-26: 0 mg via INTRAVENOUS
  Filled 2017-10-25 (×3): qty 100

## 2017-10-25 MED ORDER — EPHEDRINE SULFATE 50 MG/ML INJECTION SOLUTION
Freq: Once | INTRAMUSCULAR | Status: DC | PRN
Start: 2017-10-25 — End: 2017-10-25
  Administered 2017-10-25: 5 mg via INTRAVENOUS

## 2017-10-25 MED ORDER — LABETALOL 20 MG/4 ML (5 MG/ML) INTRAVENOUS SYRINGE
5.0000 mg | INJECTION | INTRAVENOUS | Status: DC | PRN
Start: 2017-10-25 — End: 2017-10-25

## 2017-10-25 MED ADMIN — sodium chloride 0.9 % (flush) injection syringe: @ 14:00:00

## 2017-10-25 MED ADMIN — albumin, human 5 % intravenous solution: INTRAVENOUS | @ 14:00:00

## 2017-10-25 MED ADMIN — nystatin 100,000 unit/gram topical powder: INTRAVENOUS | @ 16:00:00 | NDC 00574200815

## 2017-10-25 MED ADMIN — lidocaine (PF) 10 mg/mL (1 %) injection solution: INTRAVENOUS | @ 15:00:00

## 2017-10-25 MED ADMIN — ampicillin-sulbactam 1.5 gram solution for injection: @ 09:00:00

## 2017-10-25 MED ADMIN — sodium chloride 0.9 % (flush) injection syringe: INTRAVENOUS | @ 15:00:00

## 2017-10-25 MED ADMIN — lactated Ringers intravenous solution: INTRAVENOUS | @ 13:00:00 | NDC 00264775000

## 2017-10-25 MED ADMIN — sodium chloride 0.9 % (flush) injection syringe: INTRAVENOUS | @ 17:00:00

## 2017-10-25 SURGICAL SUPPLY — 120 items
ADHESIVE TISSUE EXOFIN .5ML_PREMIERPRO EXOFIN MICRO HV (SEALANTS) ×4
APPL 70% ISPRP 2% CHG 26ML 13._2X13.2IN CHLRPRP PREP DEHP-FR (WOUND CARE/ENTEROSTOMAL SUPPLY) ×1
APPL 70% ISPRP 2% CHG 26ML CHLRPRP HI-LT ORNG PREP STRL LF  DISP CLR (WOUND CARE SUPPLY) ×1 IMPLANT
APPLIER E-CLP III SUP INTLK 33CM 5MM PSTL GRIP GLARE RST SAF INTLK HNDL 16 CLIP TI MED LRG INTERNAL (ENDOSCOPIC SUPPLIES) ×1 IMPLANT
APPLIER E-CLP III SUP INTLK 33_CM 5MM PSTL GRIP GLARE RST SAF (INSTRUMENTS ENDOMECHANICAL) ×1
APPLIER E-CLP2 SUP INTLK 29CM 10MM PSTL GRIP GLARE RST SAF INTLK HNDL 20 ML CLIP TI INTERNAL CLIP (ENDOSCOPIC SUPPLIES) IMPLANT
APPLIER E-CLP2 SUP INTLK 29CM_10MM PSTL GRIP GLARE RST SAF (INSTRUMENTS ENDOMECHANICAL)
BAG 28X36IN BAND EQP (DRAPE/PACKS/SHEETS/OR TOWEL) ×1 IMPLANT
CABLE CATH 72IN CYROCATH CRYOABLATION COAX UMB STRL DISP (Connecting Tubes/Misc) IMPLANT
CATH UMB 72IN COAX (Connecting Tubes/Misc)
CLOSURE SKIN STRIPS 1/2X4IN_R1547 6/PK 50PK/BX (WOUND CARE/ENTEROSTOMAL SUPPLY) ×1
CONV USE 338641 - PACK SURG LAPSCP NONST DISP LF (CUSTOM TRAYS & PACK) ×1 IMPLANT
CONV USE ITEM 337890 - PACK SURG BSIN 2 STRL LF  DISP (CUSTOM TRAYS & PACK) ×1 IMPLANT
COVER ENDOSCP TIP HOT SHR DAVI_NCI ENDOWRIST 8MM STD CAUT (CUTTING ELEMENTS) ×1
COVER WAND RFD STRL 50EA/CS_01-0020 (EQUIPMENT MINOR) ×1
COVER WND RF DETECT STRL CLR EQP (EQUIPMENT MINOR) ×1 IMPLANT
DISCONTINUED USE 307230 - LOOP VESSEL MINI 457X.83MM LF  YW BND PCH SIL DISP STRL (WOUND CARE SUPPLY) ×2 IMPLANT
DISCONTINUED USE 329547 - SEAL ENDOS INSTR 5-8MM DAVINCI XI UNIV (SURGICAL INSTRUMENTS) ×4 IMPLANT
DISCONTINUED USE 330076 - ADH SKNCLS EXOFIN PREMIERPRO MICRO HVSC SFT FLXB APPL TUBE STRL TISS LF  DISP .5ML (SEALANTS) ×2 IMPLANT
DISCONTINUED USE ITEM 329547 - SEAL CANN 12MM ENDOWRIST STPLR DAVINCI XI (SUTURE/WOUND CLOSURE) ×1 IMPLANT
DRAIN FLUTED 19FR BLAKE_2232 (Drains/Resovoirs) ×1
DRAIN INCS 19FR .25IN BLAK SIL 4 CHNL RADOPQ HBLS BEND TROCAR STRL RND DISP WHT (Drains/Resovoirs) ×1 IMPLANT
DRAPE 4FT BIG CA BCK TBL MODL 427 HVDTY OR SPEC LF  STRL DISP SURG (EQUIPMENT MINOR) ×1 IMPLANT
DRAPE 4FT BIG CA BCK TBL MODL_427 HVDTY OR SPEC LF STRL (EQUIPMENT MINOR) ×1
DRAPE ARM 21X19X10.5IN DAVINCI XI EQP 21LB (DRAPE/PACKS/SHEETS/OR TOWEL) ×4 IMPLANT
DRAPE ARM 21X19X10.5IN DAVINCI_XI EQP 21LB (DRAPE/PACKS/SHEETS/OR TOWEL) ×4
DRAPE BILAMINATE UTIL TAPE 26X_15IN LF DISP SURG (EQUIPMENT MINOR)
DRAPE BND BAG 28X36IN SNAP KOV_ER EQP (DRAPE/PACKS/SHEETS/OR TOWEL) ×1
DRAPE CLMN DAVINCI XI EQP (DRAPE/PACKS/SHEETS/OR TOWEL) ×2 IMPLANT
DUPE USE ITEM 319409 - DRAPE BILAMINATE UTIL TAPE 26X_15IN LF DISP SURG (EQUIPMENT MINOR) IMPLANT
ELECTRODE ESURG BLADE 6.5IN EDGE LF (CAUTERY SUPPLIES) ×1 IMPLANT
ELECTRODE ESURG FLAT LHOOK 36CM CLEANCOAT PTFE COAT LAPSCP (CAUTERY SUPPLIES) IMPLANT
ELECTRODE ESURG FLAT LHOOK 36C_M CLEANCOAT COAT LAPSCP (CAUTERY SUPPLIES)
ELECTRODE ESURG XTD BLADE 6.5I_N 3/32IN EDGE STRL DISP INSL (CAUTERY SUPPLIES) ×1
ENDOCATCH 10MM SPEC POUCH_173050G 6EA/BX (INSTRUMENTS ENDOMECHANICAL) ×1
ENDOCATCH 15MM SPECIMEN BAG_173049 3EA/BX (INSTRUMENTS ENDOMECHANICAL) ×1
ENDOSCP MNTN CLEARIFY 8X6IN WA_RM HUB 2 CLTH TROCAR WP MRFBR (INSTRUMENTS) ×1
EVACUATOR SMOKE LAP 8.0L/MIN_SC082500 PINK 10EA/BX (INSTRUMENTS) ×1
FILTER SMOKEVAC PNK SEECLEAR 15 MMHG LAPSCP MLSTG SYS PSV DISP (SURGICAL INSTRUMENTS) ×1 IMPLANT
GARMENT COMPRESS MED CALF CENTAURA NYL VASOGRAD LTWT BRTHBL SEQ FIL BLU 18- IN (ORTHOPEDICS (NOT IMPLANTS)) ×1 IMPLANT
GARMENT COMPRESS MED CALF CENT_AURA NYL VASOGRAD LTWT BRTHBL (ORTHOPEDICS (NOT IMPLANTS)) ×1
GELPOINT ADVANCED ACCESS PLATFORM ×2 IMPLANT
GOWN SURG XL L3 NONREINFORCE HKLP CLSR SET IN SLEEVE STRL LF  DISP BLU SIRUS SMS 47IN (DGOW) ×1 IMPLANT
GOWN SURG XL L3 NONREINFORCE H_KLP CLSR SET IN SLEEVE STRL LF (DGOW) ×1
HANDPC SUCT MEDIVAC YANKAUER BLBS TIP CLR STRL LF  DISP (Suction) IMPLANT
HANDPC SUCT MEDIVAC YANKAUER B_LBS TIP CLR STRL LF DISP (Suction)
HEMOSTAT ABS 8X4IN FLXB SHR WV_SRGCL STRL DISP (WOUND CARE SUPPLY) IMPLANT
HEMOSTAT ABS 8X4IN FLXB SHR WV_SRGCL STRL DISP (WOUND CARE/ENTEROSTOMAL SUPPLY)
IMG CLEARIFY 8X6IN WARM HUB TROCAR WIPE MRFBR SYSTEM DISP (SURGICAL INSTRUMENTS) ×1 IMPLANT
IRR SUCT STRKFL2 TIP STRL LF  DISP (IRR) ×1 IMPLANT
IRR SUCT STRKFL2 TIP STRL LF_DISP (IRR) ×1
LABEL E-Z STICK_STLEZP1 100EA/CS (LABELS/CHART SUPPLIES) ×1
LABEL MED EZ PEEL MRKR LF (LABELS/CHART SUPPLIES) ×1 IMPLANT
LEGGINGS SURG 43X29IN CUF PRXM_SMS STRL LF DISP (DRAPE/PACKS/SHEETS/OR TOWEL) ×1
LEGGINGS SURG 48X31IN CUF PRXM SMS 6IN STRL LF  DISP (DRAPE/PACKS/SHEETS/OR TOWEL) ×1 IMPLANT
LOOP VESSEL MINI 457X.83MM LF_YW BND PCH SIL DISP STRL (WOUND CARE/ENTEROSTOMAL SUPPLY) ×2
LUB INSTR 4ML ANTISTICK WO PAD (DIS) ×1
LUB INSTR BTL PAD ELC LUBE FOAM STRL (DIS) ×1 IMPLANT
NEEDLE INSFL 100MM 14GA STEP PNMPRTN TROCAR BLDLS RADIAL EXPD SLEEVE BLUNT STY VRSTP + SS (ENDOSCOPIC SUPPLIES) ×1 IMPLANT
NEEDLE INSFL 100MM 14GA STEP P_NMPRTN TROCAR BLDLS RADIAL (INSTRUMENTS ENDOMECHANICAL) ×1
OBTURATOR LAPSCP 8MM WECK VST BLDLS STRL LF  DISP (SURGICAL INSTRUMENTS) ×1 IMPLANT
OBTURATOR LAPSCP 8MM WECK VST_BLDLS STRL LF DISP (INSTRUMENTS) ×1
PACK BASIN DBL CUSTOM (CUSTOM TRAYS & PACK) ×1
PACK LAPAROSCOPIC CUSTOM (CUSTOM TRAYS & PACK) ×1
PACK SURG LAPSCP NONST DISP LF (CUSTOM TRAYS & PACK) ×1
PAD POSITIONING PIGAZZI XL (SUPP) ×1
PAD XL 40X20X1IN POSITION THE PNK PAD 1 LFT SHEET BODY STRAP PERI CTOUT NONST DISP PIGAZZI (SUPP) ×1 IMPLANT
POUCH SPEC RETR 34.5CM 10MM E-CTCH GLD POLYUR ERG HNDL LONG CYL TUBE 29.5CM LAPSCP LF  DISP (ENDOSCOPIC SUPPLIES) ×1 IMPLANT
POUCH SPEC RETR 34.5CM 15MM E-CTCH POLYUR FLXB LONG CYL TUBE 29.5CM STRL LF  DISP (ENDOSCOPIC SUPPLIES) ×1 IMPLANT
Pancreatic Stent Freeman Dlexi-Stent 5 fr 5 cm sin ×1 IMPLANT
Pancreatic Stent Freeman Flexi-Stent 5 fr 5 cm sin ×1 IMPLANT
RELOAD ENDO 45 ARTIC VASC/MED_EGIA45AVM TRI STAPLE BX/6 TAN (INSTRUMENTS ENDOMECHANICAL)
RELOAD ENDO 60 ARTIC VASC MED_EGIA60AVM TRI STAPLE BX6 TAN (INSTRUMENTS ENDOMECHANICAL)
RELOAD ENDO GIA ROTIC 45 3.5_EGIA45AMT SULU 6EA/BX (INSTRUMENTS ENDOMECHANICAL)
RELOAD ENDO GIA ROTIC 60 3.5_EGIA60AMT SULU 6EA/BX (INSTRUMENTS ENDOMECHANICAL) ×1
RELOAD STPLR 2MM 2.5MM 3MM 45MM EGIA TI MED VAS TISS ARTC KNIFE BLADE LGR ANVIL BCKT STRL LF  DISP (ENDOSCOPIC SUPPLIES) IMPLANT
RELOAD STPLR 2MM 2.5MM 3MM 60MM EGIA TI MED VAS TISS ARTC KNIFE BLADE LGR ANVIL BCKT STRL LF  DISP (ENDOSCOPIC SUPPLIES) IMPLANT
RELOAD STPLR 3MM 3.5MM 4MM 45MM EGIA TI MED THKTIS ARTC KNIFE BLADE LGR ANVIL BCKT STRL LF  DISP (ENDOSCOPIC SUPPLIES) IMPLANT
RELOAD STPLR 60MM TI 3MM 3.5MM 4MM MED THKTIS ARTC KNIFE BLADE LGR ANVIL BCKT STRL LF  DISP EGIA (ENDOSCOPIC SUPPLIES) ×1 IMPLANT
RELOAD STPLR MED CURVE 30MM TRI-STPL 2 VAS STRL LF  TAN (ENDOSCOPIC SUPPLIES) ×2 IMPLANT
RELOAD STPLR MED CURVE 45MM TRI-STPL 2 VAS STRL LF  TAN (ENDOSCOPIC SUPPLIES) IMPLANT
RELOAD STPLR MED CURVE 60MM TRI-STPL 2 VAS STRL LF  TAN (ENDOSCOPIC SUPPLIES) IMPLANT
RESERVOIR DRAIN SIL JP BULB 100CC STRL LF  DISP (WOUND CARE SUPPLY) ×1 IMPLANT
RESERVOIR DRAIN SIL JP BULB 10_0CC STRL LF DISP (WOUND CARE/ENTEROSTOMAL SUPPLY) ×1
SEAL CANN 12MM ENDOWRIST STPLR DAVINCI XI (SUTURE/WOUND CLOSURE) ×1
SEAL CANN 12MM ENDOWRIST STPLR_DAVINCI XI (SUTURE/WOUND CLOSURE) ×1
SEAL ENDOS INSTR 5-8MM DAVINCI_XI UNIV (INSTRUMENTS) ×4
SEALDIVD LAPSCP 44CM LIGASURE 350D 18.5MM MARYLAND CURVE JAW NANO COAT 20.3MM (SURGICAL INSTRUMENTS) ×1 IMPLANT
SEALDIVD LAPSCP 44CM LIGASURE_350D 18.5MM MARYLAND CURVE JAW (INSTRUMENTS) ×2
SEALER BIPOLAR 11.13MM 3MM END_OFB30 TRANSCOLLATION 10D CYL (CAUTERY SUPPLIES)
SEALER ESURG 11.13MM 3MM ENDO FB3 10D FLOAT BALL CYL MONOPOL DSCT BLUNT ELECTRODE 317.5MM STRL LF (CAUTERY SUPPLIES) IMPLANT
SHEATH LAPSCP ENDOWRIST STPLR (SURGICAL INSTRUMENTS) ×1 IMPLANT
SPONGE LAP 18X4IN STD RADOPQ 4 PLY REINF ABS RFDETECT COTTON STRL LF  DISP (WOUND CARE SUPPLY) ×1 IMPLANT
SPONGE LAP RFD 4INX18IN_L041804P01C1 40PK/CS (WOUND CARE/ENTEROSTOMAL SUPPLY) ×1
STAPLER ENDO GIA ARTIC CVD 30_SIG30CTAVM VAS MED TAN 6EA/BX (INSTRUMENTS ENDOMECHANICAL) ×2
STAPLER ENDO GIA UNIV 12MM_EGIAUSTND 3EA/BX (INSTRUMENTS ENDOMECHANICAL) ×1
STAPLER ENDO RELOAD CVD 45MM_SIG45CTAVM MED 6EA/BX (INSTRUMENTS ENDOMECHANICAL)
STAPLER ENDO RELOAD CVD 60MM_SIG60CTAVM 6/BX (INSTRUMENTS ENDOMECHANICAL)
STAPLER ENDOWRIST 45 SHEATH_410370 10/BX (INSTRUMENTS) ×1
STAPLER INTERNAL 16CMX4MM PVC STD UNIV TISS STRL LF  DISP EGIA ENDOS 12MM (ENDOSCOPIC SUPPLIES) ×1 IMPLANT
STRIP 4X.5IN STRSTRP PLSTR REINF SKNCLS WHT STRL LF (WOUND CARE SUPPLY) ×1 IMPLANT
SUTURE 5-0 RB-2 PDS + 30IN VIOL 2 ARM ANBCTRL ABS (SUTURE/WOUND CLOSURE) ×10 IMPLANT
SUTURE TRCLSN 5-0 RB-2 PDS + 3_0IN VIOL 2 ARM MONOF ANBCTRL (SUTURE/WOUND CLOSURE) ×10
SYSTEM GELPORT HAND ACCESS_C8XX2 (ENDOSCOPIC SUPPLIES) IMPLANT
SYSTEM GELPORT HAND ACCESS_C8XX2 (INSTRUMENTS ENDOMECHANICAL)
TIP CAUT HOT SHR DAVINCI ENDOWRIST 8MM STD CAUT MONOPOL DISP (SURGICAL CUTTING SUPPLIES) ×1 IMPLANT
TRAY CATH 16FR LUBRICATH SAF-FLOW STATLK FOLEY DRAIN BAG ADV ANTIREFLUX CHAMBER NATURAL RUB DDRGL 5 (CATHETERS) ×1 IMPLANT
TRAY CATH 16FR LUBRICATH SAF-F_LOW STATLK FOLEY DRAIN BAG ADV (CATHETERS) ×1
TROCAR BLADELESS 12MM_6EA/BX DISP (INSTRUMENTS ENDOMECHANICAL)
TROCAR BLADELESS 5MM NONB5STF_6EA/BX DISP (INSTRUMENTS ENDOMECHANICAL) ×2
TROCAR BLADELESS 5MM ONB5STF_ONB5STF 6EA/BX (TROC)
TROCAR LAPSCP 100MM 5MM KII FIOS Z THREAD SLEEVE 1ST ENTRY STRL LF  ACCESS SYS ABDOMINAL (ENDOSCOPIC SUPPLIES) ×1 IMPLANT
TROCAR LAPSCP STD 100MM 12MM VERSAONE FIX CANN BLDLS DLPHN NOSE TIP STRL LF  DISP (ENDOSCOPIC SUPPLIES) IMPLANT
TROCAR LAPSCP STD 100MM 5MM VERSAONE FIX CANN BLDLS DLPHN NOSE TIP OPTC STRL LF  DISP (TROC) IMPLANT
TROCAR LAPSCP STD 100MM 5MM VERSAONE FIX CANN BLDLS DLPHN NOSE TIP STRL LF  DISP (ENDOSCOPIC SUPPLIES) ×2 IMPLANT
TROCAR LAPSCP STD 15MM VRSTP + RADIAL EXPD SLEEVE CANN DIL STRL LF  DISP (ENDOSCOPIC SUPPLIES) IMPLANT
TROCAR VERSASTEP PLUS 15MM_VS101015P 3EA/BX (INSTRUMENTS ENDOMECHANICAL)
TROCAR Z-THREAD KII 5X100MM_CTF03 (INSTRUMENTS ENDOMECHANICAL) ×1
TUBING SUCT CLR 20FT 9/32IN MEDIVAC NCDTV M/M CONN STRL LF (Suction) IMPLANT
TUBING SUCT CONN 20FT LONG_STRL N720A (Suction)

## 2017-10-25 NOTE — Care Plan (Signed)
Problem: Patient Care Overview (Adult,OB)  Goal: Plan of Care Review(Adult,OB)  The patient and/or their representative will communicate an understanding of their plan of care   Outcome: Ongoing (see interventions/notes)      Problem: Perioperative Period (Adult)  Prevent and manage potential problems including:1. bleeding2. gastrointestinal complications3. hypothermia4. infection5. pain6. perioperative injury7. respiratory compromise8. situational response9. urinary retention10. venous thromboembolism11. wound complications   Goal: Signs and Symptoms of Listed Potential Problems Will be Absent or Manageable (Perioperative Period)  Signs and symptoms of listed potential problems will be absent or manageable by discharge/transition of care (reference Perioperative Period (Adult) CPG).   Outcome: Ongoing (see interventions/notes)

## 2017-10-25 NOTE — Care Plan (Signed)
Problem: Patient Care Overview (Adult,OB)  Goal: Plan of Care Review(Adult,OB)  The patient and/or their representative will communicate an understanding of their plan of care   Outcome: Ongoing (see interventions/notes)    Goal: Individualization/Patient Specific Goal(Adult/OB)  Outcome: Ongoing (see interventions/notes)      Comments: Pt admitted to [facility] after robotic whipple on 10/25/2017. Pt arrived to unit around 1930 and ambulated from stretcher to bed. Pt has no complaints of pain and is resting comfortably. ART line in tact with poor reading/waveform - service notified. No other issues or concerns at this time Maryjean Morn, RN  10/25/2017, 22:40

## 2017-10-25 NOTE — Anesthesia Procedure Notes (Addendum)
Neuraxial Block    Sedation  The patient was continuously monitored throughout the procedure and in recovery. I was in attendance and supervised the sedation (during the start and stop times listed below) and remained immediately available until the patient returned to pre-procedure baseline.  Romero Liner, MD 10/25/2017, 09:56  Sedation Start Time  10/25/2017 8:05 AM   Sedation Stop Time 10/25/2017 8:12 AM  Blocks   Block: spinal (lumbar)   Indication:post-op pain management   Diagnosis: abdominal pain    Requesting surgeon: Nicola Police  Technique:(See MAR for doses)  Approach: midline      Preprocedure hand washing was performed; sterile field was maintained with sterile gloves, gown, cap, mask, and sterile large drape    Skin Prep: Mask, Hand hygiene performed, Draped, Cap, Sterile technique, Sterilely prepped and draped, Sterile gloves, Sterile field established and Sterile drape   Preanesthesia Checklist:  Pre anesthesia checklist: site marked, monitors and equipment checked, timeout performed, anesthesia consent, emergency drugs available and positioning concerns  Position:  Positioning: sitting   Skin Local:  Skin local: Lidocaine 1%   Spinal Needle:  Spinal needle:Sprotte    Spinal Needle gauge: 25 G    Spinal needle length: 5 cm   Epidural Needle:                Epidural Catheter:            Block Events:  Procedure Events:no complications   Test dose:             Dosing      Patient response comfortable.    NOTES  Block free text:The patient was continuously monitored throughout the procedure and in recovery. I was in attendance and supervised the sedation (during the start and stop times listed above) and remained immediately available until the patient returned to pre-procedure baseline.     Performed by:      Anesthesiologist: Camdan Burdi  I was present and supervised/observed the entire procedure.  Romero Liner, MD 10/25/2017, 08:16

## 2017-10-25 NOTE — OR Surgeon (Addendum)
PATIENT NAME: Aldrich NUMBER:  [id number]  DATE OF SERVICE: 10/25/2017  DATE OF BIRTH:  1946/11/21    OPERATIVE REPORT    PREOPERATIVE DIAGNOSIS:  Pancreatic adenocarcinoma.    POSTOPERATIVE DIAGNOSIS:  Pancreatic adenocarcinoma.    NAME OF PROCEDURES:  1. Diagnostic laparoscopy.   2. Lysis of adhesions.   3. Robotic-assisted pancreaticoduodenectomy.   4. Creation of vascularized falciform flap.    SURGEON:  Tish Frederickson, MD.    ASSISTANTS:  1. Nichola Sizer, MD FACS (first assistant).   2. Sheralyn Boatman, PA-C (second assistant).    ANESTHESIA:  General endotracheal anesthesia.    ESTIMATED BLOOD LOSS:  300 mL.    SPECIMENS:  Whipple specimen with frozen on pancreatic neck margin and bile duct margin.    DRAINS:  A 19-French round JP in the right upper quadrant.    INDICATION FOR PROCEDURE:  Ms. Whiters is a 71 year old female who was diagnosed with pancreatic adenocarcinoma while living in [place].  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  She was recommended to undergo neoadjuvant chemotherapy, followed by radiation, which she completed on September 14, 2017.  Shortly thereafter, she moved her residence to [place] and was evaluated in the [facility] surgical oncology clinic for resectability.  She had a good biochemical response to treatment, with little change in her most recent CT scan.  However, given the absence of progression of disease or evidence of metastasis, we discussed taking her to the operating room for a resection, given that she had maximized her preoperative treatment.  After the indications, risks, benefits and alternatives were discussed with the patient, she signed informed consent for a robotic, possible open, Whipple procedure with possible portal vein resection.    DESCRIPTION OF PROCEDURE:  The patient was identified in the preoperative holding area, transported by  anesthesia back to the operating room, where she was placed supine on the operating room table.  Anesthesia was induced and an endotracheal tube was passed.  A entral line and arterial line were placed by anesthesia, and a Foley catheter was placed by the surgical team.  The patient's abdomen was then prepped and draped in the standard sterile fashion, and she was given preoperative antibiotics.  A timeout was performed prior to making the incision.  A Veress needle was placed in the left upper quadrant without difficulty and pneumoperitoneum was obtained.  A 5-mm port was then placed in the patient's right lower quadrant, and the camera was inserted.  There was evidence of adhesions to the upper midline from the patient's prior ventral hernia repair.  These were taken down using electrocautery and the LigaSure device.  Additional robotic ports were placed along the upper abdomen and 2 assistant ports were placed in the right lower and left lower quadrants.  We then evaluated the abdomen and saw no evidence of metastatic disease and proceeded with the operation.  The robot was then docked and instruments were placed into the abdominal cavity under direct visualization. The lesser sac was entered using electrocautery and the LigaSure device was used to open this towards the duodenum.  The right colon was then mobilized and the duodenum was exposed.  The kocherization was then performed until the ligament of Treitz was taken down and the proximal jejunum was brought up through this defect.  We then used the LigaSure device to open the small bowel  mesentery and a purple load of the EndoGIA stapler was used to divide the proximal jejunum.  The LigaSure device was then used to take down the mesentery along the specimen side of the jejunum, up until the uncinate process was encountered.  We then turned our attention back to the stomach.  The gastroepiploic arcade was taken with the LigaSure device in the distal stomach.   The lesser omentum was then opened and the LigaSure device was used to take the right gastric artery.  The stomach was then stapled with two 60 mm purple loads of the EndoGIA stapler after ensuring that the orogastric tube was removed.  The stomach was then retracted laterally and the hepatic artery lymph node was identified and resected with the specimen.  This revealed the hepatic artery, which was dissected using electrocautery.  The right gastric artery was identified and divided with a LigaSure.  We then identified the GDA.  There was extensive fibrosis of this area from the patient's prior radiation.  Eventually, this was completely encircled and a 30 mm gold load of the EndoGIA stapler was used to divide the GDA, after ensuring an adequate pulse in the hepatic artery proper.  We then completed the portal dissection by identifying the portal vein posteriorly and working along this until we reached the bile duct.  This was then retracted laterally and a window was created behind the bile duct, to the right side of the portal vein.  This was encircled with a VesseLoop and then a gold load of the EndoGIA was used to divide the bile duct.  We then worked on the superior portion of the retropancreatic tunnel.  Following this, we turned our attention to the inferior border of the pancreas.  Electrocautery was used to open to this area and then the superior mesenteric vein was identified.  We began the retropancreatic tunnel from inferiorly and discovered that there is dense adherence of the pancreas to the portal vein.  We then worked our tunnel towards the splenic vein and found a window behind the pancreas that was adequate to begin the division of the pancreas.  There remained some adherence of the pancreas to the portal vein superior to that.  We began to divide the pancreas using cauterize scissors.  Once the pancreatic duct was encountered, this was divided sharply.  Given that there was adherence of the  portal vein to the posterior pancreas, we did not complete the transsection, but divided what we could do safely.  We then turned our attention to the uncinate process dissection.  We identified the distal SMV and took several branches going into the SMV using the LigaSure device.  We then mobilized the uncinate process off of the vein inferiorly, working superiorly.  Once we made it to the pancreatic head, we discovered that again there was dense adherence of the pancreas to the portal vein.  Therefore, we went down inferiorly and began an artery first approach using the LigaSure device.  We came through areas of dense fibrotic tissue, until we made our way all the way up to the level of the bile duct, leaving the specimen attached only to the portal vein.  Seeing that there was this adherence, we elected to gain proximal and distal control of the portal vein, superior mesenteric vein, as well as splenic vein.  These were completely dissected and Vesseloops were placed. We then placed laparoscopic bulldogs around these vessels to completely isolate the area of the portal vein attached to  the specimen.  Once this was achieved, we used electrocautery to sharply dissect the mass off of the vein.  This proceeded without any apparent injury to the vein, and the specimen was completely removed.  With the specimen off, we then took off of the laparoscopic bulldog clamps and found no bleeding.  We then performed a cholecystectomy, placing clips on the cyst duct as well as the cystic artery, and removed the gallbladder from the liver parenchyma.  This was then removed in a 10 mm EndoCatch bag and a 15 mm EndoCatch bag was used to remove the specimen.  The left lower quadrant incision served as the extraction site and this was opened to allow delivery of the specimen.  There was significant bleeding after the incision was enlarged, most likely from the inferior epigastric artery.  This was controlled with suture ligation.   The wound was irrigated and hemostasis was ensured.  The specimen was then sent to pathology for frozen section.  I personally reviewed the slides with the pathologist.  The bile duct margin was negative.  The pancreatic duct margin showed atypical cells.  However, there was no evidence of malignancy.  Given that we had already taken the pancreas well onto the splenic vein and there was no definitive evidence of adenocarcinoma, we elected not to taken an additional pancreatic margin.  We then began reconstruction using a modified Blumgart anastomosis utilizing three 2-0 silk U-stitches to approximate the jejunum and pancreas, and 5-0 PDS were then used to sewn duct to mucosa in an interrupted fashion for the inner layer of the pancreaticojejunostomy.  A 4-0 Fr Hobbs stent was placed into the pancreatic and bowel.  An outer layer of the 2-0 silk was then used to encompass the anastomosis.  The bile duct was then further dissected and the cystic duct was removed.  The bile duct was very small and, therefore, interrupted 5-0 PDS was used to perform an end-to-side anastomosis along the jejunum.  A 5-0 Fr Hobbs stent was placed into the bile duct and bowel.  We then performed a hand-sewn 2-layer gastrojejunostomy using the V-Loc suture.  The abdomen was copiously irrigated and we ensured adequate hemostasis.  The falciform was then mobilized and placed over the top of the gastroduodenal artery stump.  A 19-French round JP was placed through the right upper quadrant port, coursing along the hepaticojejunostomy and terminating overtop of the pancreaticoduodenostomy.  The robot was undocked.  The ports were removed.  #1 PDS interrupted figure of eight sutures were placed to close the fascia. The skin was closed with 4-0 monocryl.      All sponge, instrument and needle counts were correct at the end of the case.  I was present and on console or scrubbed for all portions of the case.  The assistance of Dr. Nichola Sizer was  required due to the fact that there was no qualified resident available for this complex, minimally invasive operation.        Tish Frederickson, MD  Assistant Professor              DD:  10/25/2017 18:39:28  DT:  10/25/2017 20:07:17 KG  D#:  [id number]

## 2017-10-25 NOTE — H&P (Signed)
[facility]  H&P Update Form    [name],[name], 70 y.o. female  Encounter Start Date:  10/25/2017  Inpatient Admission Date: 10/25/2017  Date of Birth:  [birth year]    10/25/2017    STOP: IF H&P IS GREATER THAN 30 DAYS FROM SURGICAL DAY COMPLETE NEW H&P IS REQUIRED.     H & P updated the day of the procedure.  1.  H&P completed within 30 days of surgical procedure by Dr. Cyndi Bender  on 10/18/2017 and has been reviewed within 24 hours of the surgery, the patient has been examined, and no change has occured in the patients condition since the H&P was completed.       Change in medications: Yes  - patient stopped taking Metformin and Aspirin as instructed       Last Menstrual Period: Post-Menopausal      Comments:     2.  Patient continues to be appropiate candidate for planned surgical procedure. YES      [name] Solomons Prisila Dlouhy, PA-C

## 2017-10-25 NOTE — Anesthesia Transfer of Care (Signed)
ANESTHESIA TRANSFER OF CARE   [name] is a 71 y.o. ,female, Weight: 84.2 kg (185 lb 10 oz)   had Procedure(s) with comments:  ROBOTIC WHIPPLE  BLOCK PRE-OP - ERAS / DURAMORPH  (REQUEST 9E)  XI ROBOT SUPPLY CARD  performed  10/25/17   Primary Service: Nicola Police*    Past Medical History:   Diagnosis Date    Abdominal hernia 10/18/2017    hx of repair    Anxiety     Arthritis     Asthma     Atrial fibrillation (CMS HCC)     Back problem     Bruises easily     Cancer (CMS [place]) 10/18/2017    chemo and radiation completed 09/24/2017    COPD (chronic obstructive pulmonary disease) (CMS HCC)     CPAP (continuous positive airway pressure) dependence 10/18/2017    has not used recently    Depression     DM (diabetes mellitus) (CMS [place]) 2000    Fasting BG 200's. HGA1C 2018 6    Edema     Essential hypertension     GERD (gastroesophageal reflux disease)     Headache     Heartburn     Hx antineoplastic chemo 2018    Hx of radiation therapy 2018    Hypercholesteremia     Hyperlipidemia     Migraine 10/18/2017    none recently    Obesity     Palpitations     Pancreatic cancer (CMS [place]) 03/2017    Panic attack     Peripheral neuropathy 10/18/2017    knees down, bilateral    Sleep apnea     Type 2 diabetes mellitus (CMS [place]) 10/18/2017    Dx 2000 FBS 200s    Unintentional weight loss 10/18/2017    25 lbs 03/2017    Wears glasses       Allergy History as of 10/25/17     SULFA (SULFONAMIDES)       Noted Status Severity Type Reaction    10/18/17 1600 Vivi Barrack, RN 10/18/17 Active Low  Itching              I completed my transfer of care / handoff to the receiving personnel during which we discussed:                                                Additional Info:Patient transferred to PACU.  VSS.  Ventilating well, respirations regular, airway patent, color pink.  Report given to RN at bedside.                      Last OR Temp: Temperature: 36.5 C (97.7 F)  ABG:  PH (ARTERIAL)      Date Value Ref Range Status   10/25/2017 7.34 (L) 7.35 - 7.45 Final     PH (T)   Date Value Ref Range Status   10/25/2017 7.34 (L) 7.35 - 7.45 Final     PCO2 (ARTERIAL)   Date Value Ref Range Status   10/25/2017 43.0 35.0 - 45.0 mm/Hg Final     PO2 (ARTERIAL)   Date Value Ref Range Status   10/25/2017 211.0 (H) 72.0 - 100.0 mm/Hg Final     SODIUM   Date Value Ref Range Status   10/25/2017 129 (L) 136 - 145 mmol/L  Final     POTASSIUM   Date Value Ref Range Status   10/18/2017 3.2 (L) 3.5 - 5.1 mmol/L Final     WHOLE BLOOD POTASSIUM   Date Value Ref Range Status   10/25/2017 3.4 (L) 3.5 - 5.0 mmol/L Final     CHLORIDE   Date Value Ref Range Status   10/25/2017 96 96 - 111 mmol/L Final     CALCIUM   Date Value Ref Range Status   10/18/2017 9.7 8.5 - 10.2 mg/dL Final     CARBOHYDRATE ANTIGEN 19-9   Date Value Ref Range Status   10/18/2017 109.5 (H) <37.0 U/mL Final     Comment:     CA19-9 Methodology = Chemiluminescent Microparticle Immunoassay performed on [employer] Architect        Note:Serial CA 19-9 measurements are used in conjunction with other              methods to monitor patients with pancreatic cancer. Smoking increases             CA 19-9 levels in serum. Elevated levels are also seen in pancreatic              cancer as well as other gastrointestinal malignancies and organic GI              disease. Screening healthy populations is not indicated or                        recommended.     Calculated P Axis   Date Value Ref Range Status   10/18/2017 11 degrees Final     Calculated R Axis   Date Value Ref Range Status   10/18/2017 -10 degrees Final     Calculated T Axis   Date Value Ref Range Status   10/18/2017 87 degrees Final     IONIZED CALCIUM   Date Value Ref Range Status   10/25/2017 1.13 1.10 - 1.30 mmol/L Final     LACTATE   Date Value Ref Range Status   10/25/2017 3.1 (H) 0.0 - 1.3 mmol/L Final     HEMOGLOBIN   Date Value Ref Range Status   10/25/2017 10.1 (L) 12.0 - 18.0 g/dL Final     OXYHEMOGLOBIN    Date Value Ref Range Status   10/25/2017 96.5 85.0 - 98.0 % Final     CARBOXYHEMOGLOBIN   Date Value Ref Range Status   10/25/2017 0.7 0.0 - 2.5 % Final     MET-HEMOGLOBIN   Date Value Ref Range Status   10/25/2017 0.5 0.0 - 3.5 % Final     BASE EXCESS (ARTERIAL)   Date Value Ref Range Status   10/25/2017 1.1 (H) 0.0 - 1.0 mmol/L Final     BASE DEFICIT   Date Value Ref Range Status   10/25/2017 2.5 0.0 - 3.0 mmol/L Final     BICARBONATE (ARTERIAL)   Date Value Ref Range Status   10/25/2017 23.0 18.0 - 26.0 mmol/L Final     TEMPERATURE, COMP   Date Value Ref Range Status   10/25/2017 37.0 15.0 - 40.0 C Final     Airway:   Blood pressure 125/64, pulse 71, temperature 36.5 C (97.7 F), resp. rate 16, height 1.549 m ('5\' 1"' ), weight 84.2 kg (185 lb 10 oz), SpO2 100 %.

## 2017-10-25 NOTE — Addendum Note (Signed)
Addendum  created 10/25/17 0956 by Romero Liner, MD    Anesthesia Intra Blocks edited, Sign clinical note

## 2017-10-25 NOTE — OR PostOp (Signed)
Dr. Bertram Denver going to order a sliding insulin scale for patient.

## 2017-10-25 NOTE — OR Nursing (Signed)
Family update given to [facility] per Dr. Cyndi Bender.

## 2017-10-25 NOTE — OR Surgeon (Signed)
[place]                                                     BRIEF OPERATIVE NOTE    Patient Name: [facility] Number: [id number]  Date of Service: 10/25/2017   Date of Birth: 1946-02-02    All elements must be documented.    Pre-Operative Diagnosis: Pancreatic Cancer   Post-Operative Diagnosis: Pancreatic Cancer  Procedure(s)/Description: 1. Robotic assisted classic pancreaticoduodenectomy  Findings/Complexity (inherent to the procedure performed): Firm fibrotic mass in the head/body of pancreas      Attending Surgeon: Cyndi Bender, MD  Assistant(s): Rosiland Oz, MD; Cosner PA-C    Anesthesia Type: General  Estimated Blood Loss:  300 cc  Blood Given: None  Fluids Given: 500 cc albumin, 1100 cc crystalloid   Complications (not routinely expected or not inherent to difficulty/nature of procedure):None  Characteristic Event (routinely expected or inherent to the difficulty/nature of the procedure): None  Did the use of current and/or prior Anticoagulants impact the outcome of the case? no  Wound Class: Clean Contaminated Wounds -Respiratory, GI, Genital, or Urinary    Tubes: None  Drains: Shannan Harper  Specimens/ Cultures: Whipple specimen   Implants: None           Disposition: PACU - hemodynamically stable.  Condition: stable    Allene Pyo, PA-C

## 2017-10-25 NOTE — OR PostOp (Signed)
Glucose 264 anesthesia aware, service to be notified at this time.

## 2017-10-26 LAB — HEPATIC FUNCTION PANEL
ALBUMIN: 3.4 g/dL (ref 3.4–4.8)
ALKALINE PHOSPHATASE: 52 U/L (ref <150)
ALT (SGPT): 80 U/L — ABNORMAL HIGH (ref <55)
AST (SGOT): 132 U/L — ABNORMAL HIGH (ref 8–41)
BILIRUBIN TOTAL: 0.7 mg/dL (ref 0.3–1.3)
PROTEIN TOTAL: 5.4 g/dL — ABNORMAL LOW (ref 6.0–8.0)

## 2017-10-26 LAB — CBC WITH DIFF
BASOPHIL #: 0.03 x10ˆ3/uL (ref 0.00–0.20)
BASOPHIL %: 0 %
BASOPHIL %: 0 %
EOSINOPHIL #: 0 x10ˆ3/uL (ref 0.00–0.50)
EOSINOPHIL %: 0 %
HCT: 30.7 % — ABNORMAL LOW (ref 33.5–45.2)
HGB: 10.3 g/dL — ABNORMAL LOW (ref 11.2–15.2)
LYMPHOCYTE #: 0.43 x10ˆ3/uL — ABNORMAL LOW (ref 1.00–4.80)
LYMPHOCYTE %: 5 %
MCH: 28.1 pg (ref 27.4–33.0)
MCH: 28.1 pg (ref 27.4–33.0)
MCHC: 33.7 g/dL (ref 32.5–35.8)
MCV: 83.5 fL (ref 78.0–100.0)
MONOCYTE #: 0.69 x10ˆ3/uL (ref 0.30–1.00)
MONOCYTE %: 9 %
MPV: 9.3 fL (ref 7.5–11.5)
NEUTROPHIL #: 6.83 10*3/uL (ref 1.50–7.70)
NEUTROPHIL #: 6.83 x10ˆ3/uL (ref 1.50–7.70)
NEUTROPHIL %: 86 %
PLATELETS: 142 x10ˆ3/uL (ref 140–450)
RBC: 3.67 x10ˆ6/uL (ref 3.63–4.92)
RDW: 16.8 % — ABNORMAL HIGH (ref 12.0–15.0)
WBC: 8 x10ˆ3/uL (ref 3.5–11.0)

## 2017-10-26 LAB — POC BLOOD GLUCOSE (RESULTS)
GLUCOSE, POC: 220 mg/dL — ABNORMAL HIGH (ref 70–105)
GLUCOSE, POC: 257 mg/dL — ABNORMAL HIGH (ref 70–105)
GLUCOSE, POC: 311 mg/dL — ABNORMAL HIGH (ref 70–105)
GLUCOSE, POC: 212 mg/dL — ABNORMAL HIGH (ref 70–105)

## 2017-10-26 LAB — BASIC METABOLIC PANEL
ANION GAP: 8 mmol/L (ref 4–13)
BUN/CREA RATIO: 19 (ref 6–22)
BUN: 14 mg/dL (ref 8–25)
CALCIUM: 8.8 mg/dL (ref 8.5–10.2)
CHLORIDE: 99 mmol/L (ref 96–111)
CO2 TOTAL: 25 mmol/L (ref 22–32)
CREATININE: 0.74 mg/dL (ref 0.49–1.10)
ESTIMATED GFR: >59 mL/min/{1.73_m2} (ref >59)
GLUCOSE: 230 mg/dL — ABNORMAL HIGH (ref 65–139)
POTASSIUM: 3.4 mmol/L — ABNORMAL LOW (ref 3.5–5.1)
SODIUM: 132 mmol/L — ABNORMAL LOW (ref 136–145)

## 2017-10-26 LAB — PT/INR: INR: 1.14 (ref 0.80–1.20)

## 2017-10-26 LAB — AMYLASE BODY FLUID: AMYLASE BODY FLUID: 12 U/L

## 2017-10-26 LAB — MAGNESIUM: MAGNESIUM: 1.2 mg/dL — ABNORMAL LOW (ref 1.6–2.5)

## 2017-10-26 LAB — H & H
HCT: 31.4 % — ABNORMAL LOW (ref 33.5–45.2)
HGB: 10.5 g/dL — ABNORMAL LOW (ref 11.2–15.2)

## 2017-10-26 LAB — PHOSPHORUS: PHOSPHORUS: 4.5 mg/dL — ABNORMAL HIGH (ref 2.3–4.0)

## 2017-10-26 MED ORDER — POTASSIUM CHLORIDE 20 MEQ/15 ML ORAL LIQUID
40.0000 meq | ORAL | Status: AC
Start: 2017-10-26 — End: 2017-10-26
  Administered 2017-10-26: 40 meq via ORAL
  Filled 2017-10-26: qty 30

## 2017-10-26 MED ORDER — MAGNESIUM SULFATE 2 GRAM/50 ML (4 %) IN WATER INTRAVENOUS PIGGYBACK
2.0000 g | INJECTION | Freq: Once | INTRAVENOUS | Status: AC
Start: 2017-10-26 — End: 2017-10-26
  Administered 2017-10-26: 0 g via INTRAVENOUS
  Administered 2017-10-26: 2 g via INTRAVENOUS
  Filled 2017-10-26: qty 50

## 2017-10-26 MED ORDER — ATORVASTATIN 10 MG TABLET
10.00 mg | ORAL_TABLET | Freq: Every evening | ORAL | Status: DC
Start: 2017-10-26 — End: 2017-11-16
  Administered 2017-10-26 – 2017-11-15 (×21): 10 mg via ORAL
  Filled 2017-10-26 (×24): qty 1

## 2017-10-26 MED ORDER — LOSARTAN 50 MG TABLET
100.0000 mg | ORAL_TABLET | Freq: Every day | ORAL | Status: DC
Start: 2017-10-26 — End: 2017-11-16
  Administered 2017-10-26 – 2017-11-09 (×14): 100 mg via ORAL
  Administered 2017-11-10: 0 mg via ORAL
  Administered 2017-11-12 – 2017-11-16 (×5): 100 mg via ORAL
  Filled 2017-10-26 (×22): qty 2

## 2017-10-26 MED ORDER — METOPROLOL SUCCINATE ER 50 MG TABLET,EXTENDED RELEASE 24 HR
50.0000 mg | ORAL_TABLET | Freq: Every day | ORAL | Status: DC
Start: 2017-10-26 — End: 2017-11-16
  Administered 2017-10-26 – 2017-11-10 (×15): 50 mg via ORAL
  Administered 2017-11-10: 0 mg via ORAL
  Administered 2017-11-11 – 2017-11-15 (×4): 50 mg via ORAL
  Administered 2017-11-16: 0 mg via ORAL
  Filled 2017-10-26 (×22): qty 1

## 2017-10-26 MED ADMIN — citalopram 20 mg tablet: ORAL | @ 09:00:00

## 2017-10-26 MED ADMIN — oxyCODONE 5 mg tablet: @ 08:00:00

## 2017-10-26 MED ADMIN — nystatin 100,000 unit/gram topical powder: @ 05:00:00 | NDC 00574200815

## 2017-10-26 MED ADMIN — CALCIUM CHLORIDE IVPB: ORAL | @ 10:00:00

## 2017-10-26 NOTE — Care Plan (Signed)
Problem: Patient Care Overview (Adult,OB)  Goal: Plan of Care Review(Adult,OB)  The patient and/or their representative will communicate an understanding of their plan of care   Outcome: Ongoing (see interventions/notes)  Discharge Plan:  Home vs home with Home Health  Patient is a 71yr old female who lives in a house alone.  Patient states she will be leaving to reside with her sister following this surgery.  Patient will be staying in a one story house with a ramp access to get inside.  Patient states she uses a cane for ambulation, no other DME.  Patient states no HH currently in the home.  Patient states family to provide transportation at discharge.  Patient independent with self care and family assists as needed.  Patient states no PCP at this point as she just moved to [facility] from [place].  Patient states will schedule an apt with PCP upon discharge.  Patient declines MPOA and laycaregiver at this time.  Patient anticipated discharge home vs home with home health.  CCC to follow.     The patient will continue to be evaluated for developing discharge needs.

## 2017-10-26 NOTE — Anesthesia Postprocedure Evaluation (Signed)
Anesthesia Post Op Evaluation    Patient: [name]  Procedure(s) with comments:  ROBOTIC WHIPPLE  BLOCK PRE-OP - ERAS / DURAMORPH  (REQUEST 9E)  XI ROBOT SUPPLY CARD    Last Vitals:Temperature: 36.6 C (97.9 F) (10/26/17 0418)  Heart Rate: 90 (10/26/17 0418)  BP (Non-Invasive): (!) 160/66 (10/26/17 0418)  Respiratory Rate: (!) 0 (10/26/17 0418)  SpO2-1: 98 % (10/26/17 0418)  Pain Score (Numeric, Faces): 0 (10/25/17 1930)    Patient location during evaluation: bedside   Post-procedure handoff checklist completed    Airway patency: patent  Anesthetic complications: no  Cardiovascular status: acceptable  Respiratory status: acceptable

## 2017-10-26 NOTE — Care Management Notes (Signed)
[facility] Management Initial Evaluation    Patient Name: [name]  Date of Birth: February 06, 1946  Sex: female  Date/Time of Admission: 10/25/2017  5:34 AM  Room/Bed: 975/A  Payor: MEDICARE / Plan: MEDICARE PART A AND B / Product Type: Medicare /   PCP: No Pcp    Pharmacy Info:   Preferred Pharmacy     None        Emergency Contact Info:   Extended Emergency Contact Information  Primary Emergency Contact: Jim Like  Address: [address]   [place] Phone: [phone]  Relation: Son    History:   [name] is a 71 y.o., female, admitted for pancreatic cancer     Height/Weight: 154.9 cm (5\' 1" ) / 85.6 kg (188 lb 11.4 oz)     LOS: 1 day   Admitting Diagnosis: Pancreatic cancer (CMS [place]) [C25.9]    Assessment:      10/26/17 1508   Assessment Details   Assessment Type Admission   Date of Care Management Update 10/26/17   Date of Next DCP Update 10/27/17   Care Management Plan   Discharge Planning Status initial meeting   Projected Discharge Date 10/29/17   CM will evaluate for rehabilitation potential yes   Patient choice offered to patient/family yes   Form for patient choice reviewed/signed and on chart no   Patient aware of possible cost for ambulance transport?  Yes   Discharge Needs Assessment   Equipment Currently Used at Home cane, quad   Equipment Needed After Discharge none   Discharge Facility/Level Of Care Needs Home vs Home with Home Health   Transportation Available family or friend will provide   Referral Information   Admission Type inpatient   Address Verified verified-no changes   Insurance Verified verified-no change   ADVANCE DIRECTIVES   Does the Patient have an Advance Directive? No, Information Offered and Given   Patient Requests Assistance in Having Advance Directive Notarized. N/A   LAY CAREGIVER   Appointed Lay Caregiver? I Decline   Employment/Financial   Patient has Prescription Coverage?  Yes       Name of Insurance Coverage for Medications  Medicare    Financial Concerns none   Living Environment   Select an age group to open "lives with" row.  Adult   Lives With alone   Living Arrangements house   Able to Return to Prior Arrangements yes   Home Safety   Home Assessment: No Problems Identified   Home Accessibility no concerns   Legal Issues   Do you have a court appointed guardian/conservator? No   Pt is a 71yr old female with   Past Medical History:   Diagnosis Date   . Abdominal hernia 10/18/2017    hx of repair   . Anxiety    . Arthritis    . Asthma    . Atrial fibrillation (CMS HCC)    . Back problem    . Bruises easily    . Cancer (CMS [place]) 10/18/2017    chemo and radiation completed 09/24/2017   . COPD (chronic obstructive pulmonary disease) (CMS HCC)    . CPAP (continuous positive airway pressure) dependence 10/18/2017    has not used recently   . Depression    . DM (diabetes mellitus) (CMS HCC) 2000    Fasting BG 200's. HGA1C 2018 6   . Edema    . Essential hypertension    . GERD (gastroesophageal reflux disease)    .  Headache    . Heartburn    . Hx antineoplastic chemo 2018   . Hx of radiation therapy 2018   . Hypercholesteremia    . Hyperlipidemia    . Migraine 10/18/2017    none recently   . Obesity    . Palpitations    . Pancreatic cancer (CMS [place]) 03/2017   . Panic attack    . Peripheral neuropathy 10/18/2017    knees down, bilateral   . Sleep apnea    . Type 2 diabetes mellitus (CMS HCC) 10/18/2017    Dx 2000 FBS 200s   . Unintentional weight loss 10/18/2017    25 lbs 03/2017   . Wears glasses    IV fluids, pain management, lab monitoring, vital sign monitoring, imaging performed     Discharge Plan:  Home vs home with Home Health  Patient is a 71yr old female who lives in a house alone.  Patient states she will be leaving to reside with her sister following this surgery.  Patient will be staying in a one story house with a ramp access to get inside.  Patient states she uses a cane for ambulation, no other DME.  Patient states no HH  currently in the home.  Patient states family to provide transportation at discharge.  Patient independent with self care and family assists as needed.  Patient states no PCP at this point as she just moved to [facility] from [place].  Patient states will schedule an apt with PCP upon discharge.  Patient declines MPOA and laycaregiver at this time.  Patient anticipated discharge home vs home with home health.  CCC to follow.     The patient will continue to be evaluated for developing discharge needs.     Case Manager: Frederik Pear, RN  Phone: [phone]

## 2017-10-26 NOTE — Care Plan (Addendum)
Problem: Patient Care Overview (Adult,OB)  Goal: Plan of Care Review(Adult,OB)  The patient and/or their representative will communicate an understanding of their plan of care   Outcome: Ongoing (see interventions/notes)      Problem: Nutrition, Imbalanced: Inadequate Oral Intake (Adult)  Goal: Identify Related Risk Factors and Signs and Symptoms  Related risk factors and signs and symptoms are identified upon initiation of Human [employer] Clinical Practice Guideline (CPG).  Outcome: Completed Date Met: 10/26/17                            [facility] Nutrition Therapy Screen Note                                    Date of Service: 10/26/2017    Reason for Note: Nutrition Screen:  Diagnosis: Pancreatic Cancer    Reviewed patient status, diet order/TF/TPN, labs and medications.  Height: 154.9 cm ('5\' 1"' )   Weight: 85.6 kg (188 lb 11.4 oz) (10/25/17 1935)   Body mass index is 35.66 kg/(m^2).    Brief Subjective:   Met with pt this morning at bedside. Pt reported poor appetite with some bloating but ate 100% of clear liquid breakfast this morning. Denied N/V/C/D. Pt reported 25# weight loss since becoming sick in the spring but could not report UBW. Pt reported weighing 285# years ago and lost weight intentionally. Per chart review, pt weighed 185# standing upon admission. At home, patient would typically eat two meals per day, but normally doesn't have much of an appetite. Pt is agreeable to apple/berry Ensure Clear TID for extra calories/protein.     Most Recent Screen   Impaired Nutrition Status Score Impaired Nutrition Status Score: 2 - Food intake 25 - 50% of normal requirement in preceding week - Moderate (10/26/17 1200)     Severity of Disease Score Severity of Disease Score: 1 - Chronic patients in particular with acute complications: cirrhosis, COPD, dialysis, oncology, recent bariatric surgery - Mild (10/26/17 1200)     Age Age: 69 - < 70 years (10/26/17 1200)   Score Score: 3  (10/26/17 1200)       Risk Level Risk Level: Level 3 - 3 pts - Screen note required. Follow-up within 5 days (10/26/17 1200)           Nutrition related problems: Lack of appetite    Assessment: 1600-1700 kcal (28-30 kcal/56.8 kg) and 57-67 g prot (1.2-1.4 g/47.7 kg)     Monitor: Po status and Tolerance of diet     Results for JENNETTE, LEASK (MRN [id number]) as of 10/26/2017 12:31   Ref. Range 10/25/2017 07:05 10/25/2017 18:32 10/25/2017 21:49 10/26/2017 06:33 10/26/2017 10:58   GLUCOSE, POC Latest Ref Range: 70 - 105 mg/dL 223 (H) 264 (H) 289 (H) 220 (H) 257 (H)       Plan/Intervention:   - Continue clear liquid diet. Advance to 1800 diabetic diet as appropriate.  - Add berry/apple Ensure clear TID and once diet advances change to ensure HP TID  - Monitor and encourage po intake.  - Weigh twice weekly, standing preferred.  - Monitor and achieve glycemic control.   - Monitor electrolytes: Labs 11/15: K: 3.4, Mg: 1.2, P: 4.5    Will continue to monitor.    Lazarus Salines     I reviewed the intern's note and  agree with the findings and recommendations documented. Any exceptions/additions are edited/noted.  Dierdre Searles, RD, LD, CNSC 10/26/2017, 15:12  Pager [phone]

## 2017-10-26 NOTE — Progress Notes (Signed)
[place], 71 y.o. female  Date of Admission:  10/25/2017  Date of Birth:  1946/08/29  Date of Service:  10/26/2017    Post Op Day: 1 Day Post-Op S/P Procedure(s) (LRB):  ROBOTIC WHIPPLE (N/A)  BLOCK PRE-OP (N/A)  XI ROBOT SUPPLY CARD (N/A)  Chief Complaint: pancreatic cancer  Subjective: NAEO. Patient denies n/v. Reports abdominal soreness.     Vital Signs:  Temp (24hrs) Max:36.9 C (33.5 F)      Systolic (45GYB), WLS:937 , Min:125 , DSK:876     Diastolic (81LXB), WIO:03, Min:56, Max:96    Temp  Avg: 36.6 C (97.8 F)  Min: 36.1 C (97 F)  Max: 36.9 C (98.4 F)  Pulse  Avg: 75.2  Min: 70  Max: 90  Resp  Avg: 12.3  Min: 0  Max: 20  SpO2  Avg: 98.9 %  Min: 96 %  Max: 100 %  MAP (Non-Invasive)  Avg: 102.8 mmHG  Min: 94 mmHG  Max: 116 mmHG  Pain Score (Numeric, Faces): 6    Current Medications:    Current Facility-Administered Medications:  acetaminophen (OFIRMEV) 10 mg/mL IV 1,000 mg 1,000 mg Intravenous Q8HRS   atorvastatin (LIPITOR) tablet 10 mg Oral QPM   citalopram (CELEXA) tablet 20 mg Oral Daily   enoxaparin PF (LOVENOX) 40 mg/0.4 mL SubQ injection 40 mg Subcutaneous Q24H   LORazepam (ATIVAN) tablet 0.5 mg Oral Daily PRN   losartan (COZAAR) tablet 100 mg Oral Daily   metoprolol succinate (TOPROL-XL) 24 hr extended release tablet 50 mg Oral Daily   NS flush syringe 2 mL Intracatheter Q8HRS   And      NS flush syringe 2-6 mL Intracatheter Q1 MIN PRN   NS premix infusion  Intravenous Continuous   ondansetron (ZOFRAN) 2 mg/mL injection 4 mg Intravenous Q6H PRN   oxyCODONE (ROXICODONE) immediate release tablet 5 mg Oral Q4H PRN   Or      oxyCODONE (ROXICODONE) immediate release tablet 10 mg Oral Q4H PRN   potassium chloride 53mq per 148moral liquid 40 mEq Oral Now   SSIP insulin lispro (HUMALOG) 100 units/mL injection 3-9 Units Subcutaneous 4x/day PRN       Today's Physical Exam:  Constitutional: no distress   Eyes: Conjunctiva clear.  ENT: Mouth mucous membranes moist.   Neck: supple, symmetrical, trachea midline  Respiratory: breathing unlabored  Cardiovascular: regular rate and rhythm  Gastrointestinal: soft, appropiately TTP, JP drain with serosanginous output, incisions c/d/i  Musculoskeletal: Head atraumatic and normocephalic  Integumentary:  Skin warm and dry  Neurologic: CN II - XII grossly intact   Psychiatric: Normal      I/O:  I/O last 24 hours:    Intake/Output Summary (Last 24 hours) at 10/26/17 0938  Last data filed at 10/26/17 0800   Gross per 24 hour   Intake             3240 ml   Output             2195 ml   Net             1045 ml     I/O current shift:  11/15 0800 - 11/15 1559  In: -  Out: 61 [Drains:60]    Prophylaxis:  Date Started Date Completed   DVT/PE  Enoxaparin and SCDs/ Venodynes/Impulse boots     GI          Nutrition/Residuals:  DIET CLEAR LIQUID    Labs  (Please indicate ordered or reviewed)  Reviewed:   Lab Results Today:    Results for orders placed or performed during the hospital encounter of 10/25/17 (from the past 24 hour(s))   ARTERIAL BLOOD GAS/COOX PANEL/ELECTROLYTES/LACTATE (NA, K, CL, iCA, GLU, LACTATE) -- TEMP COMP   Result Value Ref Range    %FIO2 (ARTERIAL) 64 %    PH (ARTERIAL) 7.44 7.35 - 7.45    PCO2 (ARTERIAL) 40.0 35.0 - 45.0 mm/Hg    PO2 (ARTERIAL) 271.0 (H) 72.0 - 100.0 mm/Hg    BASE EXCESS (ARTERIAL) 2.8 (H) 0.0 - 1.0 mmol/L    BICARBONATE (ARTERIAL) 27.1 (H) 18.0 - 26.0 mmol/L    TEMPERATURE, COMP 37.0 15.0 - 40.0 C    PH (T) 7.44 7.35 - 7.45    CO2, TOTAL 40.0 35.0 - 45.0 mm/Hg    (T) PO2 271.0 (H) 72.0 - 100.0 mm/Hg    SODIUM 127 (L) 136 - 145 mmol/L    WHOLE BLOOD POTASSIUM 2.6 (LL) 3.5 - 5.0 mmol/L    CHLORIDE 94 (L) 96 - 111 mmol/L    IONIZED CALCIUM 1.15 1.10 - 1.30 mmol/L    GLUCOSE 187 (H) 60 - 105 mg/dL    LACTATE 1.9 (H) 0.0 - 1.3 mmol/L    HEMOGLOBIN 10.3 (L) 12.0 - 18.0 g/dL    OXYHEMOGLOBIN 97.0 85.0 - 98.0 %    CARBOXYHEMOGLOBIN 0.9 0.0 - 2.5 %     MET-HEMOGLOBIN 0.9 0.0 - 3.5 %    O2CT 14.7 (L) 15.7 - 24.3 %    PAO2/FIO2 RATIO 423 <=200   ARTERIAL BLOOD GAS/COOX PANEL/ELECTROLYTES/LACTATE (NA, K, CL, iCA, GLU, LACTATE) -- TEMP COMP   Result Value Ref Range    %FIO2 (ARTERIAL) 38 %    PH (ARTERIAL) 7.38 7.35 - 7.45    PCO2 (ARTERIAL) 45.0 35.0 - 45.0 mm/Hg    PO2 (ARTERIAL) 190.0 (H) 72.0 - 100.0 mm/Hg    BASE EXCESS (ARTERIAL) 1.1 (H) 0.0 - 1.0 mmol/L    BICARBONATE (ARTERIAL) 25.8 18.0 - 26.0 mmol/L    TEMPERATURE, COMP 37.0 15.0 - 40.0 C    PH (T) 7.38 7.35 - 7.45    CO2, TOTAL 45.0 35.0 - 45.0 mm/Hg    (T) PO2 190.0 (H) 72.0 - 100.0 mm/Hg    SODIUM 127 (L) 136 - 145 mmol/L    WHOLE BLOOD POTASSIUM 3.2 (L) 3.5 - 5.0 mmol/L    CHLORIDE 96 96 - 111 mmol/L    IONIZED CALCIUM 1.17 1.10 - 1.30 mmol/L    GLUCOSE 220 (H) 60 - 105 mg/dL    LACTATE 1.5 (H) 0.0 - 1.3 mmol/L    HEMOGLOBIN 10.6 (L) 12.0 - 18.0 g/dL    OXYHEMOGLOBIN 96.4 85.0 - 98.0 %    CARBOXYHEMOGLOBIN 0.5 0.0 - 2.5 %    MET-HEMOGLOBIN 1.1 0.0 - 3.5 %    O2CT 14.8 (L) 15.7 - 24.3 %    PAO2/FIO2 RATIO 500 <=200   ARTERIAL BLOOD GAS/COOX PANEL/ELECTROLYTES/LACTATE (NA, K, CL, iCA, GLU, LACTATE) -- TEMP COMP   Result Value Ref Range    %FIO2 (ARTERIAL) 52 %    PH (ARTERIAL) 7.36 7.35 - 7.45    PCO2 (ARTERIAL) 43.0 35.0 - 45.0 mm/Hg  PO2 (ARTERIAL) 249.0 (H) 72.0 - 100.0 mm/Hg    BASE DEFICIT 1.2 0.0 - 3.0 mmol/L    BICARBONATE (ARTERIAL) 24.0 18.0 - 26.0 mmol/L    TEMPERATURE, COMP 37.0 15.0 - 40.0 C    PH (T) 7.36 7.35 - 7.45    CO2, TOTAL 43.0 35.0 - 45.0 mm/Hg    (T) PO2 249.0 (H) 72.0 - 100.0 mm/Hg    SODIUM 128 (L) 136 - 145 mmol/L    WHOLE BLOOD POTASSIUM 3.3 (L) 3.5 - 5.0 mmol/L    CHLORIDE 96 96 - 111 mmol/L    IONIZED CALCIUM 1.16 1.10 - 1.30 mmol/L    GLUCOSE 176 (H) 60 - 105 mg/dL    LACTATE 2.8 (H) 0.0 - 1.3 mmol/L    HEMOGLOBIN 11.1 (L) 12.0 - 18.0 g/dL    OXYHEMOGLOBIN 96.7 85.0 - 98.0 %    CARBOXYHEMOGLOBIN 1.0 0.0 - 2.5 %    MET-HEMOGLOBIN 0.9 0.0 - 3.5 %    O2CT 15.7 15.7 - 24.3 %     PAO2/FIO2 RATIO 479 <=200   ARTERIAL BLOOD GAS/COOX PANEL/ELECTROLYTES/LACTATE (NA, K, CL, iCA, GLU, LACTATE) -- TEMP COMP   Result Value Ref Range    %FIO2 (ARTERIAL) 50 %    PH (ARTERIAL) 7.34 (L) 7.35 - 7.45    PCO2 (ARTERIAL) 43.0 35.0 - 45.0 mm/Hg    PO2 (ARTERIAL) 211.0 (H) 72.0 - 100.0 mm/Hg    BASE DEFICIT 2.5 0.0 - 3.0 mmol/L    BICARBONATE (ARTERIAL) 23.0 18.0 - 26.0 mmol/L    TEMPERATURE, COMP 37.0 15.0 - 40.0 C    PH (T) 7.34 (L) 7.35 - 7.45    CO2, TOTAL 43.0 35.0 - 45.0 mm/Hg    (T) PO2 211.0 (H) 72.0 - 100.0 mm/Hg    SODIUM 129 (L) 136 - 145 mmol/L    WHOLE BLOOD POTASSIUM 3.4 (L) 3.5 - 5.0 mmol/L    CHLORIDE 96 96 - 111 mmol/L    IONIZED CALCIUM 1.13 1.10 - 1.30 mmol/L    GLUCOSE 207 (H) 60 - 105 mg/dL    LACTATE 3.1 (H) 0.0 - 1.3 mmol/L    HEMOGLOBIN 10.1 (L) 12.0 - 18.0 g/dL    OXYHEMOGLOBIN 96.5 85.0 - 98.0 %    CARBOXYHEMOGLOBIN 0.7 0.0 - 2.5 %    MET-HEMOGLOBIN 0.5 0.0 - 3.5 %    O2CT 14.2 (L) 15.7 - 24.3 %    PAO2/FIO2 RATIO 422 <=200   POC BLOOD GLUCOSE (RESULTS)   Result Value Ref Range    GLUCOSE, POC 264 (H) 70 - 105 mg/dL   POC BLOOD GLUCOSE (RESULTS)   Result Value Ref Range    GLUCOSE, POC 289 (H) 70 - 105 mg/dL   BASIC METABOLIC PANEL   Result Value Ref Range    SODIUM 132 (L) 136 - 145 mmol/L    POTASSIUM 3.4 (L) 3.5 - 5.1 mmol/L    CHLORIDE 99 96 - 111 mmol/L    CO2 TOTAL 25 22 - 32 mmol/L    ANION GAP 8 4 - 13 mmol/L    CALCIUM 8.8 8.5 - 10.2 mg/dL    GLUCOSE 230 (H) 65 - 139 mg/dL    BUN 14 8 - 25 mg/dL    CREATININE 0.74 0.49 - 1.10 mg/dL    BUN/CREA RATIO 19 6 - 22    ESTIMATED GFR >59 >59 mL/min/1.60m2   MAGNESIUM   Result Value Ref Range    MAGNESIUM 1.2 (L) 1.6 - 2.5 mg/dL   PHOSPHORUS  Result Value Ref Range    PHOSPHORUS 4.5 (H) 2.3 - 4.0 mg/dL   PT/INR   Result Value Ref Range    PROTHROMBIN TIME 13.2 9.3 - 13.9 seconds    INR 1.14 0.80 - 1.20   HEPATIC FUNCTION PANEL   Result Value Ref Range    ALBUMIN 3.4 3.4 - 4.8 g/dL    ALKALINE PHOSPHATASE 52 <150 U/L    ALT (SGPT)  80 (H) <55 U/L    AST (SGOT) 132 (H) 8 - 41 U/L    BILIRUBIN TOTAL 0.7 0.3 - 1.3 mg/dL    BILIRUBIN DIRECT 0.3 (H) <0.3 mg/dL    PROTEIN TOTAL 5.4 (L) 6.0 - 8.0 g/dL   CBC WITH DIFF   Result Value Ref Range    WBC 8.0 3.5 - 11.0 x10^3/uL    RBC 3.67 3.63 - 4.92 x10^6/uL    HGB 10.3 (L) 11.2 - 15.2 g/dL    HCT 30.7 (L) 33.5 - 45.2 %    MCV 83.5 78.0 - 100.0 fL    MCH 28.1 27.4 - 33.0 pg    MCHC 33.7 32.5 - 35.8 g/dL    RDW 16.8 (H) 12.0 - 15.0 %    PLATELETS 142 140 - 450 x10^3/uL    MPV 9.3 7.5 - 11.5 fL    NEUTROPHIL % 86 %    LYMPHOCYTE % 5 %    MONOCYTE % 9 %    EOSINOPHIL % 0 %    BASOPHIL % 0 %    NEUTROPHIL # 6.83 1.50 - 7.70 x10^3/uL    LYMPHOCYTE # 0.43 (L) 1.00 - 4.80 x10^3/uL    MONOCYTE # 0.69 0.30 - 1.00 x10^3/uL    EOSINOPHIL # 0.00 0.00 - 0.50 x10^3/uL    BASOPHIL # 0.03 0.00 - 0.20 x10^3/uL   POC BLOOD GLUCOSE (RESULTS)   Result Value Ref Range    GLUCOSE, POC 220 (H) 70 - 105 mg/dL   AMYLASE BODY FLUID   Result Value Ref Range    AMYLASE BODY FLUID 12 No Reference Range Established U/L     Ordered:  none    Assessment/ Plan:   Active Hospital Problems   (*Primary Problem)    Diagnosis   . Pancreatic cancer (CMS [facility])     [name] is a 71 yo F POD 1 s/p robotic whipple for pancreatic cancer.     -discontinue art line  -remove foley  -check drain amylase  -OOB  -dispo: pending    Cleatrice Burke, MD    I saw and examined the patient.  I reviewed the resident's note.  I agree with the findings and plan of care as documented in the resident's note.  Doing well overnight.  Tolerating clears. Passing flatus.  Sore but pain controlled.      Lynwood Dawley, MD

## 2017-10-26 NOTE — Nurses Notes (Signed)
Patient's right abdominal JP drain had an output of 334mL within 6 hours. Contacted service, an order to draw an Jessie was placed. HH resulted with a Hgb of 10.5 and a Hct of 31.4, service is aware and recommends to continue monitoring. Patient is stable and resting comfortably, will continue to monitor. Evette Cristal, RN  10/26/2017, 14:29

## 2017-10-27 LAB — CBC WITH DIFF
BASOPHIL #: 0.05 x10ˆ3/uL (ref 0.00–0.20)
BASOPHIL %: 1 %
EOSINOPHIL #: 0.16 x10ˆ3/uL (ref 0.00–0.50)
EOSINOPHIL %: 2 %
HCT: 29.7 % — ABNORMAL LOW (ref 33.5–45.2)
HGB: 9.9 g/dL — ABNORMAL LOW (ref 11.2–15.2)
LYMPHOCYTE #: 0.63 x10ˆ3/uL — ABNORMAL LOW (ref 1.00–4.80)
LYMPHOCYTE %: 7 %
LYMPHOCYTE %: 7 %
MCH: 28.1 pg (ref 27.4–33.0)
MCHC: 33.4 g/dL (ref 32.5–35.8)
MCV: 84.2 fL (ref 78.0–100.0)
MONOCYTE #: 0.86 x10ˆ3/uL (ref 0.30–1.00)
MONOCYTE %: 10 %
MPV: 9.3 fL (ref 7.5–11.5)
NEUTROPHIL #: 7.22 x10ˆ3/uL (ref 1.50–7.70)
NEUTROPHIL %: 81 %
PLATELETS: 136 10*3/uL — ABNORMAL LOW (ref 140–450)
PLATELETS: 136 x10ˆ3/uL — ABNORMAL LOW (ref 140–450)
RBC: 3.53 x10ˆ6/uL — ABNORMAL LOW (ref 3.63–4.92)
RDW: 16.9 % — ABNORMAL HIGH (ref 12.0–15.0)
WBC: 8.9 x10ˆ3/uL (ref 3.5–11.0)

## 2017-10-27 LAB — PHOSPHORUS
PHOSPHORUS: 2.4 mg/dL (ref 2.3–4.0)
PHOSPHORUS: 2.8 mg/dL (ref 2.3–4.0)

## 2017-10-27 LAB — POC BLOOD GLUCOSE (RESULTS)
GLUCOSE, POC: 229 mg/dL — ABNORMAL HIGH (ref 70–105)
GLUCOSE, POC: 229 mg/dL — ABNORMAL HIGH (ref 70–105)
GLUCOSE, POC: 141 mg/dL — ABNORMAL HIGH (ref 70–105)
GLUCOSE, POC: 257 mg/dL — ABNORMAL HIGH (ref 70–105)
GLUCOSE, POC: 280 mg/dL — ABNORMAL HIGH (ref 70–105)
GLUCOSE, POC: 228 mg/dL — ABNORMAL HIGH (ref 70–105)

## 2017-10-27 LAB — BASIC METABOLIC PANEL
ANION GAP: 8 mmol/L (ref 4–13)
ANION GAP: 8 mmol/L (ref 4–13)
BUN/CREA RATIO: 10 (ref 6–22)
BUN/CREA RATIO: 11 (ref 6–22)
BUN: 6 mg/dL — ABNORMAL LOW (ref 8–25)
BUN: 7 mg/dL — ABNORMAL LOW (ref 8–25)
CALCIUM: 8.4 mg/dL — ABNORMAL LOW (ref 8.5–10.2)
CALCIUM: 8.9 mg/dL (ref 8.5–10.2)
CHLORIDE: 96 mmol/L (ref 96–111)
CHLORIDE: 96 mmol/L (ref 96–111)
CHLORIDE: 96 mmol/L (ref 96–111)
CO2 TOTAL: 23 mmol/L (ref 22–32)
CO2 TOTAL: 24 mmol/L (ref 22–32)
CREATININE: 0.6 mg/dL (ref 0.49–1.10)
CREATININE: 0.66 mg/dL (ref 0.49–1.10)
ESTIMATED GFR: >59 mL/min/{1.73_m2} (ref >59)
ESTIMATED GFR: >59 mL/min/{1.73_m2} (ref >59)
ESTIMATED GFR: >59 mL/min/{1.73_m2} (ref >59)
GLUCOSE: 193 mg/dL — ABNORMAL HIGH (ref 65–139)
GLUCOSE: 221 mg/dL — ABNORMAL HIGH (ref 65–139)
POTASSIUM: 2.9 mmol/L — ABNORMAL LOW (ref 3.5–5.1)
POTASSIUM: 4.7 mmol/L (ref 3.5–5.1)
SODIUM: 127 mmol/L — ABNORMAL LOW (ref 136–145)
SODIUM: 128 mmol/L — ABNORMAL LOW (ref 136–145)

## 2017-10-27 LAB — HEPATIC FUNCTION PANEL
ALBUMIN: 3 g/dL — ABNORMAL LOW (ref 3.4–4.8)
ALKALINE PHOSPHATASE: 53 U/L (ref <150)
ALT (SGPT): 43 U/L (ref <55)
AST (SGOT): 42 U/L — ABNORMAL HIGH (ref 8–41)
BILIRUBIN DIRECT: 0.5 mg/dL — ABNORMAL HIGH (ref <0.3)

## 2017-10-27 LAB — MAGNESIUM: MAGNESIUM: 1.6 mg/dL (ref 1.6–2.5)

## 2017-10-27 MED ORDER — DOCUSATE SODIUM 100 MG CAPSULE
100.0000 mg | ORAL_CAPSULE | Freq: Two times a day (BID) | ORAL | Status: DC
Start: 2017-10-27 — End: 2017-11-16
  Administered 2017-10-27 – 2017-11-02 (×13): 100 mg via ORAL
  Administered 2017-11-03: 0 mg via ORAL
  Administered 2017-11-03: 100 mg via ORAL
  Administered 2017-11-04 – 2017-11-06 (×6): 0 mg via ORAL
  Administered 2017-11-07: 100 mg via ORAL
  Administered 2017-11-07 – 2017-11-09 (×5): 0 mg via ORAL
  Administered 2017-11-10: 100 mg via ORAL
  Administered 2017-11-10: 0 mg via ORAL
  Administered 2017-11-11 (×2): 100 mg via ORAL
  Administered 2017-11-12 – 2017-11-13 (×4): 0 mg via ORAL
  Administered 2017-11-14: 100 mg via ORAL
  Administered 2017-11-14: 0 mg via ORAL
  Administered 2017-11-15: 100 mg via ORAL
  Administered 2017-11-15: 0 mg via ORAL
  Administered 2017-11-16: 100 mg via ORAL
  Filled 2017-10-27 (×25): qty 1

## 2017-10-27 MED ORDER — POTASSIUM CHLORIDE 20 MEQ/15 ML ORAL LIQUID
40.00 meq | Freq: Every morning | ORAL | Status: DC
Start: 2017-10-27 — End: 2017-11-15
  Administered 2017-10-27 – 2017-10-29 (×3): 40 meq via ORAL
  Administered 2017-10-30 – 2017-11-02 (×4): 0 meq via ORAL
  Administered 2017-11-03 – 2017-11-08 (×6): 40 meq via ORAL
  Administered 2017-11-09: 0 meq via ORAL
  Administered 2017-11-10 – 2017-11-11 (×2): 40 meq via ORAL
  Administered 2017-11-12 – 2017-11-15 (×4): 0 meq via ORAL
  Filled 2017-10-27 (×20): qty 30

## 2017-10-27 MED ORDER — HYDROCHLOROTHIAZIDE 25 MG TABLET
25.0000 mg | ORAL_TABLET | Freq: Every day | ORAL | Status: DC
Start: 2017-10-27 — End: 2017-11-07
  Administered 2017-10-27 – 2017-10-29 (×3): 25 mg via ORAL
  Administered 2017-10-30: 0 mg via ORAL
  Administered 2017-10-31 – 2017-11-06 (×7): 25 mg via ORAL
  Filled 2017-10-27 (×12): qty 1

## 2017-10-27 MED ORDER — MAGNESIUM SULFATE 2 GRAM/50 ML (4 %) IN WATER INTRAVENOUS PIGGYBACK
2.0000 g | INJECTION | Freq: Once | INTRAVENOUS | Status: AC
Start: 2017-10-27 — End: 2017-10-27
  Administered 2017-10-27: 2 g via INTRAVENOUS
  Administered 2017-10-27: 0 g via INTRAVENOUS
  Filled 2017-10-27: qty 50

## 2017-10-27 MED ORDER — POTASSIUM PHOSPHATES-MBASIC AND DIBASIC 3 MMOL/ML INTRAVENOUS SOLUTION
20.0000 mmol | Freq: Once | INTRAVENOUS | Status: AC
Start: 2017-10-27 — End: 2017-10-27
  Administered 2017-10-27: 20 mmol via INTRAVENOUS
  Administered 2017-10-27: 0 mmol via INTRAVENOUS
  Filled 2017-10-27: qty 6.67

## 2017-10-27 MED ORDER — MAGNESIUM SULFATE 2 GRAM/50 ML (4 %) IN WATER INTRAVENOUS PIGGYBACK
2.0000 g | INJECTION | INTRAVENOUS | Status: AC
Start: 2017-10-27 — End: 2017-10-27
  Administered 2017-10-27: 0 g via INTRAVENOUS
  Administered 2017-10-27: 2 g via INTRAVENOUS
  Filled 2017-10-27: qty 50

## 2017-10-27 MED ORDER — SENNOSIDES 8.6 MG-DOCUSATE SODIUM 50 MG TABLET
1.0000 | ORAL_TABLET | Freq: Two times a day (BID) | ORAL | Status: DC
Start: 2017-10-27 — End: 2017-11-16
  Administered 2017-10-27 – 2017-11-03 (×13): 1 via ORAL
  Administered 2017-11-03 – 2017-11-06 (×6): 0 via ORAL
  Administered 2017-11-07: 1 via ORAL
  Administered 2017-11-07: 0 via ORAL
  Administered 2017-11-08: 1 via ORAL
  Administered 2017-11-08 – 2017-11-09 (×3): 0 via ORAL
  Administered 2017-11-10 – 2017-11-11 (×3): 1 via ORAL
  Administered 2017-11-12 – 2017-11-14 (×4): 0 via ORAL
  Administered 2017-11-14: 1 via ORAL
  Administered 2017-11-15: 0 via ORAL
  Administered 2017-11-15 – 2017-11-16 (×2): 1 via ORAL
  Filled 2017-10-27 (×27): qty 1

## 2017-10-27 MED ORDER — POTASSIUM CHLORIDE 10 MEQ/100ML IN STERILE WATER INTRAVENOUS PIGGYBACK
10.0000 meq | INJECTION | Freq: Once | INTRAVENOUS | Status: AC
Start: 2017-10-27 — End: 2017-10-27
  Administered 2017-10-27: 0 meq via INTRAVENOUS
  Administered 2017-10-27: 10 meq via INTRAVENOUS
  Filled 2017-10-27: qty 100

## 2017-10-27 MED ORDER — POTASSIUM CHLORIDE 10 MEQ/100ML IN STERILE WATER INTRAVENOUS PIGGYBACK
10.0000 meq | INJECTION | INTRAVENOUS | Status: AC
Start: 2017-10-27 — End: 2017-10-27
  Administered 2017-10-27: 0 meq via INTRAVENOUS
  Administered 2017-10-27 (×2): 10 meq via INTRAVENOUS
  Administered 2017-10-27: 0 meq via INTRAVENOUS
  Filled 2017-10-27 (×2): qty 100

## 2017-10-27 MED ADMIN — magnesium sulfate 2 gram/50 mL (4 %) in water intravenous piggyback: INTRAVENOUS | @ 05:00:00

## 2017-10-27 MED ADMIN — atorvastatin 10 mg tablet: ORAL | @ 20:00:00

## 2017-10-27 MED ADMIN — sodium chloride 0.9 % (flush) injection syringe: INTRAVENOUS | @ 06:00:00

## 2017-10-27 MED ADMIN — nebivoloL 5 mg tablet: INTRAVENOUS | @ 10:00:00

## 2017-10-27 NOTE — Nurses Notes (Signed)
Patient's manual BP 184/70 in the left arm at 1116. Patient received ordered BP meds at 0647. Patient is not symptomatic, and is currently resting comfortably sitting up in the chair. Contacted service, new order placed for HTZ. Will continue to monitor. Evette Cristal, RN  10/27/2017, 11:59

## 2017-10-27 NOTE — Nurses Notes (Addendum)
Had Whipple procedure on the 14 th of November.Today is second day post surgery .  Patient is alert and oriented x 4 due  medications given as per [facility], assessment done as per flow sheet.Denies any pain at this time.5 laps site dry,drain is still with mild leaking seen,bulb changed.Instructed to call when in pain or needs assistance call bell within reach.Will continue to monitor patient

## 2017-10-27 NOTE — Care Management Notes (Signed)
[place] Management Note    Patient Name: [name]  Date of Birth: Sep 21, 1946  Sex: female  Date/Time of Admission: 10/25/2017  5:34 AM  Room/Bed: 975/A  Payor: MEDICARE / Plan: MEDICARE PART A AND B / Product Type: Medicare /    LOS: 2 days   PCP: No Pcp    Admitting Diagnosis:  Pancreatic cancer (CMS [facility]) [C25.9]    Assessment:      10/27/17 1526   Assessment Details   Assessment Type Continued Assessment   Date of Care Management Update 10/27/17   Date of Next DCP Update 10/30/17   Care Management Plan   Discharge Planning Status plan in progress   Projected Discharge Date 10/30/17   CM will evaluate for rehabilitation potential yes   Patient choice offered to patient/family yes   Form for patient choice reviewed/signed and on chart no   Patient aware of possible cost for ambulance transport?  Yes   Discharge Needs Assessment   Discharge Facility/Level Of Care Needs Home vs Home with Home Health   Transportation Available family or friend will provide       Discharge Plan:  Home vs home with Home Health  Per service patient is not medically stable for discharge at this time.  Patient to be discharging home with sister upon discharge.  PT/OT consult added.  CCC to follow for discharge recs.     The patient will continue to be evaluated for developing discharge needs.     Case Manager: Frederik Pear, RN  Phone: [phone]

## 2017-10-27 NOTE — Care Plan (Signed)
Problem: Patient Care Overview (Adult,OB)  Goal: Individualization/Patient Specific Goal(Adult/OB)  Outcome: Ongoing (see interventions/notes)  Patient care individualized to meet patient care needs. Patient's central double lumen catheter removed today by Dr. Maricela Bo, pt tolerated well, pressure dressing applied to right lateral neck. Pt BP elevated, contacted service and HTZ ordered in addition to scheduled BP medications. Pt's diet advanced to mechanical soft, and she is tolerating well. Pt's continuous fluids d/c. Pt's Jp drain to the right abdomen producing a lot of output, drainage from JP insertion site present, service aware. Dressing JP insertion site with splt gauze and abd pad. Pt is resting comfortably and her pain is being controlled with 5mg  oxycodone. Evette Cristal, RN  10/27/2017, 14:33

## 2017-10-27 NOTE — Nurses Notes (Addendum)
Patient has surgery on 14 th of November post whipple procedure, five laps side with dermbond and they open to air.She has Blake drain at left lower abdomen its been leaking serosanguinous fluids dressing done twice,output is still .Bood pressure been on the higher side,Dr Josetta Huddle is informed he stated that patient is having high BP since day time,,he is also made aware about the output of drain, no new orders made.Medications given as per MAR,assessment done as per flow sheet.Instructed to call when in pain or needs assistance,call bell within reach.Will continue to monitor.

## 2017-10-27 NOTE — Progress Notes (Signed)
[place], 71 y.o. female  Date of Admission:  10/25/2017  Date of Birth:  March 03, 1946  Date of Service:  10/27/2017    Post Op Day: 2 Days Post-Op S/P Procedure(s) (LRB):  ROBOTIC WHIPPLE (N/A)  BLOCK PRE-OP (N/A)  XI ROBOT SUPPLY CARD (N/A)  Chief Complaint: pancreatic cancer  Subjective: NAEO. Patient denies n/v. Reports abdominal soreness.     Vital Signs:  Temp (24hrs) Max:36.9 C (84.1 F)      Systolic (32GMW), NUU:725 , Min:102 , DGU:440     Diastolic (34VQQ), VZD:63, Min:50, Max:83    Temp  Avg: 36.8 C (98.2 F)  Min: 36.6 C (97.9 F)  Max: 36.9 C (98.4 F)  Pulse  Avg: 85.9  Min: 77  Max: 92  Resp  Avg: 15.3  Min: 0  Max: 22  SpO2  Avg: 95.7 %  Min: 94 %  Max: 97 %  MAP (Non-Invasive)  Avg: 97.7 mmHG  Min: 89 mmHG  Max: 105 mmHG  Pain Score (Numeric, Faces): 2    Current Medications:    Current Facility-Administered Medications:  atorvastatin (LIPITOR) tablet 10 mg Oral QPM   citalopram (CELEXA) tablet 20 mg Oral Daily   docusate sodium (COLACE) capsule 100 mg Oral 2x/day   enoxaparin PF (LOVENOX) 40 mg/0.4 mL SubQ injection 40 mg Subcutaneous Q24H   hydroCHLOROthiazide (HYDRODIURIL) tablet 25 mg Oral Daily   LORazepam (ATIVAN) tablet 0.5 mg Oral Daily PRN   losartan (COZAAR) tablet 100 mg Oral Daily   metoprolol succinate (TOPROL-XL) 24 hr extended release tablet 50 mg Oral Daily   NS flush syringe 2 mL Intracatheter Q8HRS   And      NS flush syringe 2-6 mL Intracatheter Q1 MIN PRN   ondansetron (ZOFRAN) 2 mg/mL injection 4 mg Intravenous Q6H PRN   oxyCODONE (ROXICODONE) immediate release tablet 5 mg Oral Q4H PRN   Or      oxyCODONE (ROXICODONE) immediate release tablet 10 mg Oral Q4H PRN   potassium chloride 42meq per 22mL oral liquid 40 mEq Oral Daily with Breakfast   sennosides-docusate sodium (SENOKOT-S) 8.6-50mg  per tablet 1 Tab Oral 2x/day   SSIP insulin lispro (HUMALOG) 100 units/mL injection  3-9 Units Subcutaneous 4x/day PRN       Today's Physical Exam:  Constitutional: no distress  Eyes: Conjunctiva clear.  ENT: Mouth mucous membranes moist.   Neck: supple, symmetrical, trachea midline  Respiratory: breathing unlabored  Cardiovascular: regular rate and rhythm  Gastrointestinal: soft, appropiately TTP, JP drain with serosanginous output, incisions c/d/i  Musculoskeletal: Head atraumatic and normocephalic  Integumentary:  Skin warm and dry  Neurologic: CN II - XII grossly intact   Psychiatric: Normal      I/O:  I/O last 24 hours:      Intake/Output Summary (Last 24 hours) at 10/27/17 1448  Last data filed at 10/27/17 1200   Gross per 24 hour   Intake             2430 ml   Output             4360 ml   Net            -1930 ml  I/O current shift:  11/16 0800 - 11/16 1559  In: -   Out: 580 [Urine:350; Drains:230]    Prophylaxis:  Date Started Date Completed   DVT/PE  Enoxaparin and SCDs/ Venodynes/Impulse boots     GI          Nutrition/Residuals:  MNT PROTOCOL FOR DIETITIAN  DIETARY ORAL SUPPLEMENTS Oral Supplements with tray: Ensure Clear-Apple; BREAKFAST/DINNER; 1 Can  DIETARY ORAL SUPPLEMENTS Oral Supplements with tray: Ensure Clear-Berry; LUNCH; 1 Can  DIET REGULAR Consistency/thickening: MECHANICAL SOFT (Ground meat/Soft foods)    Labs  (Please indicate ordered or reviewed)  Reviewed:   Lab Results Today:    Results for orders placed or performed during the hospital encounter of 10/25/17 (from the past 24 hour(s))   POC BLOOD GLUCOSE (RESULTS)   Result Value Ref Range    GLUCOSE, POC 311 (H) 70 - 105 mg/dL   POC BLOOD GLUCOSE (RESULTS)   Result Value Ref Range    GLUCOSE, POC 212 (H) 70 - 105 mg/dL   HEPATIC FUNCTION PANEL   Result Value Ref Range    ALBUMIN 3.0 (L) 3.4 - 4.8 g/dL    ALKALINE PHOSPHATASE 53 <150 U/L    ALT (SGPT) 43 <55 U/L    AST (SGOT) 42 (H) 8 - 41 U/L    BILIRUBIN TOTAL 0.9 0.3 - 1.3 mg/dL    BILIRUBIN DIRECT 0.5 (H) <0.3 mg/dL    PROTEIN TOTAL 5.0 (L) 6.0 - 8.0 g/dL   BASIC  METABOLIC PANEL   Result Value Ref Range    SODIUM 127 (L) 136 - 145 mmol/L    POTASSIUM 2.9 (L) 3.5 - 5.1 mmol/L    CHLORIDE 96 96 - 111 mmol/L    CO2 TOTAL 23 22 - 32 mmol/L    ANION GAP 8 4 - 13 mmol/L    CALCIUM 8.4 (L) 8.5 - 10.2 mg/dL    GLUCOSE 193 (H) 65 - 139 mg/dL    BUN 6 (L) 8 - 25 mg/dL    CREATININE 0.60 0.49 - 1.10 mg/dL    BUN/CREA RATIO 10 6 - 22    ESTIMATED GFR >59 >59 mL/min/1.29m2   MAGNESIUM   Result Value Ref Range    MAGNESIUM 1.1 (L) 1.6 - 2.5 mg/dL   PHOSPHORUS   Result Value Ref Range    PHOSPHORUS 2.4 2.3 - 4.0 mg/dL   CBC WITH DIFF   Result Value Ref Range    WBC 8.9 3.5 - 11.0 x103/uL    RBC 3.53 (L) 3.63 - 4.92 x106/uL    HGB 9.9 (L) 11.2 - 15.2 g/dL    HCT 29.7 (L) 33.5 - 45.2 %    MCV 84.2 78.0 - 100.0 fL    MCH 28.1 27.4 - 33.0 pg    MCHC 33.4 32.5 - 35.8 g/dL    RDW 16.9 (H) 12.0 - 15.0 %    PLATELETS 136 (L) 140 - 450 x103/uL    MPV 9.3 7.5 - 11.5 fL    NEUTROPHIL % 81 %    LYMPHOCYTE % 7 %    MONOCYTE % 10 %    EOSINOPHIL % 2 %    BASOPHIL % 1 %    NEUTROPHIL # 7.22 1.50 - 7.70 x103/uL    LYMPHOCYTE # 0.63 (L) 1.00 - 4.80 x103/uL    MONOCYTE # 0.86 0.30 - 1.00 x103/uL    EOSINOPHIL # 0.16 0.00 - 0.50 x103/uL    BASOPHIL # 0.05 0.00 - 0.20 x103/uL  POC BLOOD GLUCOSE (RESULTS)   Result Value Ref Range    GLUCOSE, POC 229 (H) 70 - 105 mg/dL   POC BLOOD GLUCOSE (RESULTS)   Result Value Ref Range    GLUCOSE, POC 141 (H) 70 - 105 mg/dL   BASIC METABOLIC PANEL   Result Value Ref Range    SODIUM 128 (L) 136 - 145 mmol/L    POTASSIUM 4.7 3.5 - 5.1 mmol/L    CHLORIDE 96 96 - 111 mmol/L    CO2 TOTAL 24 22 - 32 mmol/L    ANION GAP 8 4 - 13 mmol/L    CALCIUM 8.9 8.5 - 10.2 mg/dL    GLUCOSE 221 (H) 65 - 139 mg/dL    BUN 7 (L) 8 - 25 mg/dL    CREATININE 0.66 0.49 - 1.10 mg/dL    BUN/CREA RATIO 11 6 - 22    ESTIMATED GFR >59 >59 mL/min/1.35m2   PHOSPHORUS   Result Value Ref Range    PHOSPHORUS 2.8 2.3 - 4.0 mg/dL   MAGNESIUM   Result Value Ref Range    MAGNESIUM 1.6 1.6 - 2.5 mg/dL      POC BLOOD GLUCOSE (RESULTS)   Result Value Ref Range    GLUCOSE, POC 257 (H) 70 - 105 mg/dL     Pathology:  Pending     Assessment/ Plan:   Active Hospital Problems   (*Primary Problem)    Diagnosis    Pancreatic cancer (CMS [facility])     Ms. Swarthout is a 71 yo F POD 2 s/p robotic whipple for pancreatic cancer.     -discontinue central line from IJ   - drain amylase on POD#1 was 12.    -OOB  -dispo: pending,.  Awaiting PT/OT recs     Idelle Jo, PA-C  10/27/2017, 14:48    I personally saw and examined the patient. See physician's assistant note for additional details.     Doing well.  Advance diet.  Check JP amylase tomorrow.    Lynwood Dawley, MD

## 2017-10-27 NOTE — Procedures (Signed)
[facility] Line Removal    Procedure Date: 10/27/2017 Time: 10:35   Procedure: Central Line Removal    Description: A triple lumen central line was removed from the right internal jugular vein with patient in trendelenburg position after sutures had been removed and alternate IV access had been obtained.  Pressure was then held for 10 minutes and the area was covered with gauze and pressure dressing.  The patient tolerated this procedure without complications.     Cleatrice Burke, MD 10/27/2017, 10:35

## 2017-10-27 NOTE — Nurses Notes (Addendum)
Paged text Dr Josetta Huddle regarding blood results ,relayed as well the BP-199/50 and drain output of 390 for 9 hours,  Awaits response or order.    -Seen orders of Dr Ocie Doyne

## 2017-10-28 LAB — BASIC METABOLIC PANEL
ANION GAP: 9 mmol/L (ref 4–13)
BUN/CREA RATIO: 10 (ref 6–22)
BUN: 7 mg/dL — ABNORMAL LOW (ref 8–25)
CALCIUM: 8.5 mg/dL (ref 8.5–10.2)
CHLORIDE: 93 mmol/L — ABNORMAL LOW (ref 96–111)
CO2 TOTAL: 24 mmol/L (ref 22–32)
CREATININE: 0.67 mg/dL (ref 0.49–1.10)
GLUCOSE: 283 mg/dL — ABNORMAL HIGH (ref 65–139)
POTASSIUM: 3.3 mmol/L — ABNORMAL LOW (ref 3.5–5.1)
SODIUM: 126 mmol/L — ABNORMAL LOW (ref 136–145)

## 2017-10-28 LAB — TYPE AND CROSS RED CELLS - UNITS
ABO/RH(D): A POS
ANTIBODY SCREEN: NEGATIVE
UNITS ORDERED: 4

## 2017-10-28 LAB — CBC WITH DIFF
BASOPHIL #: 0.06 10*3/uL (ref 0.00–0.20)
BASOPHIL %: 1 %
EOSINOPHIL #: 0.23 10*3/uL (ref 0.00–0.50)
EOSINOPHIL %: 3 %
HCT: 29.9 % — ABNORMAL LOW (ref 33.5–45.2)
HGB: 10.4 g/dL — ABNORMAL LOW (ref 11.2–15.2)
LYMPHOCYTE #: 0.56 10*3/uL — ABNORMAL LOW (ref 1.00–4.80)
LYMPHOCYTE %: 7 %
MCH: 29.1 pg (ref 27.4–33.0)
MCHC: 34.7 g/dL (ref 32.5–35.8)
MCHC: 34.7 g/dL (ref 32.5–35.8)
MCV: 83.9 fL (ref 78.0–100.0)
MONOCYTE #: 0.79 10*3/uL (ref 0.30–1.00)
MONOCYTE %: 9 %
MONOCYTE %: 9 %
MPV: 9.9 fL (ref 7.5–11.5)
NEUTROPHIL #: 7.03 10*3/uL (ref 1.50–7.70)
NEUTROPHIL %: 81 %
PLATELETS: 140 10*3/uL (ref 140–450)
RDW: 17.2 % — ABNORMAL HIGH (ref 12.0–15.0)

## 2017-10-28 LAB — TRIGLYCERIDE BODY FLUID: TRIGLYCERIDES BODY FLUID: 513 mg/dL

## 2017-10-28 LAB — POC BLOOD GLUCOSE (RESULTS)
GLUCOSE, POC: 278 mg/dL — ABNORMAL HIGH (ref 70–105)
GLUCOSE, POC: 310 mg/dL — ABNORMAL HIGH (ref 70–105)
GLUCOSE, POC: 364 mg/dL — ABNORMAL HIGH (ref 70–105)
GLUCOSE, POC: 291 mg/dL — ABNORMAL HIGH (ref 70–105)

## 2017-10-28 LAB — BPAM PACKED CELL ORDER
UNIT DIVISION: 0
UNIT DIVISION: 0
UNIT DIVISION: 0
UNIT DIVISION: 0

## 2017-10-28 LAB — HEPATIC FUNCTION PANEL
ALBUMIN: 2.5 g/dL — ABNORMAL LOW (ref 3.4–4.8)
ALT (SGPT): 27 U/L (ref <55)
AST (SGOT): 20 U/L (ref 8–41)
BILIRUBIN TOTAL: 0.9 mg/dL (ref 0.3–1.3)

## 2017-10-28 MED ORDER — POTASSIUM CHLORIDE ER 20 MEQ TABLET,EXTENDED RELEASE(PART/CRYST)
20.0000 meq | ORAL_TABLET | ORAL | Status: AC
Start: 2017-10-28 — End: 2017-10-28
  Administered 2017-10-28: 20 meq via ORAL
  Filled 2017-10-28: qty 1

## 2017-10-28 MED ORDER — HYDRALAZINE 20 MG/ML INJECTION SOLUTION
10.0000 mg | INTRAMUSCULAR | Status: DC | PRN
Start: 2017-10-28 — End: 2017-11-16
  Administered 2017-10-28 – 2017-11-05 (×4): 10 mg via INTRAVENOUS
  Filled 2017-10-28 (×4): qty 1

## 2017-10-28 MED ORDER — MAGNESIUM SULFATE 2 GRAM/50 ML (4 %) IN WATER INTRAVENOUS PIGGYBACK
2.0000 g | INJECTION | Freq: Once | INTRAVENOUS | Status: AC
Start: 2017-10-28 — End: 2017-10-28
  Administered 2017-10-28: 0 g via INTRAVENOUS
  Administered 2017-10-28: 2 g via INTRAVENOUS
  Filled 2017-10-28: qty 50

## 2017-10-28 MED ADMIN — lactated Ringers intravenous solution: SUBCUTANEOUS | @ 12:00:00

## 2017-10-28 MED ADMIN — enoxaparin 40 mg/0.4 mL subcutaneous syringe: SUBCUTANEOUS | @ 05:00:00

## 2017-10-28 MED ADMIN — sodium chloride 0.9 % (flush) injection syringe: @ 05:00:00

## 2017-10-28 MED ADMIN — sodium chloride 0.9 % (flush) injection syringe: ORAL | @ 21:00:00

## 2017-10-28 MED ADMIN — furosemide 40 mg tablet: INTRAVENOUS | @ 10:00:00

## 2017-10-28 NOTE — Progress Notes (Signed)
[place], 71 y.o. female  Date of Admission:  10/25/2017  Date of Birth:  20-May-1946  Date of Service:  10/28/2017    Post Op Day: 3 Days Post-Op S/P Procedure(s) (LRB):  ROBOTIC WHIPPLE (N/A)  BLOCK PRE-OP (N/A)  XI ROBOT SUPPLY CARD (N/A)  Chief Complaint: pancreatic cancer  Subjective: NAEO. Pain controlled with oral medications, notes abdominal soreness. Tolerating her diet without increased nausea or vomiting. Denies fevers, chills, chest pain, or dyspnea.    Vital Signs:  Temp (24hrs) Max:37.1 C (16.1 F)      Systolic (09UEA), VWU:981 , Min:152 , XBJ:478     Diastolic (29FAO), ZHY:86, Min:56, Max:83    Temp  Avg: 36.9 C (98.4 F)  Min: 36.7 C (98.1 F)  Max: 37.1 C (98.8 F)  Pulse  Avg: 86.1  Min: 78  Max: 90  Resp  Avg: 13.4  Min: 0  Max: 21  SpO2  Avg: 96.3 %  Min: 95 %  Max: 98 %  MAP (Non-Invasive)  Avg: 92.6 mmHG  Min: 82 mmHG  Max: 105 mmHG  Pain Score (Numeric, Faces): 8    Current Medications:    Current Facility-Administered Medications:  atorvastatin (LIPITOR) tablet 10 mg Oral QPM   citalopram (CELEXA) tablet 20 mg Oral Daily   docusate sodium (COLACE) capsule 100 mg Oral 2x/day   enoxaparin PF (LOVENOX) 40 mg/0.4 mL SubQ injection 40 mg Subcutaneous Q24H   hydrALAZINE (APRESOLINE) injection 10 mg 10 mg Intravenous Q4H PRN   hydroCHLOROthiazide (HYDRODIURIL) tablet 25 mg Oral Daily   LORazepam (ATIVAN) tablet 0.5 mg Oral Daily PRN   losartan (COZAAR) tablet 100 mg Oral Daily   metoprolol succinate (TOPROL-XL) 24 hr extended release tablet 50 mg Oral Daily   NS flush syringe 2 mL Intracatheter Q8HRS   And      NS flush syringe 2-6 mL Intracatheter Q1 MIN PRN   ondansetron (ZOFRAN) 2 mg/mL injection 4 mg Intravenous Q6H PRN   oxyCODONE (ROXICODONE) immediate release tablet 5 mg Oral Q4H PRN   Or      oxyCODONE (ROXICODONE) immediate release tablet 10 mg Oral Q4H PRN   potassium chloride 7meq  per 63mL oral liquid 40 mEq Oral Daily with Breakfast   sennosides-docusate sodium (SENOKOT-S) 8.6-50mg  per tablet 1 Tab Oral 2x/day   SSIP insulin lispro (HUMALOG) 100 units/mL injection 3-9 Units Subcutaneous 4x/day PRN       Today's Physical Exam:  Constitutional: no distress  Eyes: Conjunctiva clear.  ENT: Mouth mucous membranes moist.   Respiratory: CTAB, breathing unlabored  Cardiovascular: regular rate and rhythm  Gastrointestinal: soft, appropiately tender, JP drain with serosanginous output, incisions c/d/i  Musculoskeletal: Head atraumatic and normocephalic  Integumentary:  Skin warm and dry  Neurologic: CN II - XII grossly intact   Psychiatric: Normal      I/O:  I/O last 24 hours:      Intake/Output Summary (Last 24 hours) at 10/28/17 1033  Last data filed at 10/28/17 0945   Gross per 24 hour   Intake              520 ml   Output  1410 ml   Net             -890 ml     I/O current shift:  11/17 0800 - 11/17 1559  In: 95 [P.O.:260]  Out: 200 [Urine:50; Drains:150]    Prophylaxis:  Date Started Date Completed   DVT/PE  Enoxaparin and SCDs/ Venodynes/Impulse boots     GI          Nutrition/Residuals:  MNT PROTOCOL FOR DIETITIAN  DIETARY ORAL SUPPLEMENTS Oral Supplements with tray: Ensure Clear-Apple; BREAKFAST/DINNER; 1 Can  DIETARY ORAL SUPPLEMENTS Oral Supplements with tray: Ensure Clear-Berry; LUNCH; 1 Can  DIET REGULAR Consistency/thickening: MECHANICAL SOFT (Ground meat/Soft foods)    Labs  (Please indicate ordered or reviewed)  Reviewed:   Lab Results Today:    Results for orders placed or performed during the hospital encounter of 10/25/17 (from the past 24 hour(s))   POC BLOOD GLUCOSE (RESULTS)   Result Value Ref Range    GLUCOSE, POC 141 (H) 70 - 105 mg/dL   BASIC METABOLIC PANEL   Result Value Ref Range    SODIUM 128 (L) 136 - 145 mmol/L    POTASSIUM 4.7 3.5 - 5.1 mmol/L    CHLORIDE 96 96 - 111 mmol/L    CO2 TOTAL 24 22 - 32 mmol/L    ANION GAP 8 4 - 13 mmol/L    CALCIUM 8.9 8.5 - 10.2  mg/dL    GLUCOSE 221 (H) 65 - 139 mg/dL    BUN 7 (L) 8 - 25 mg/dL    CREATININE 0.66 0.49 - 1.10 mg/dL    BUN/CREA RATIO 11 6 - 22    ESTIMATED GFR >59 >59 mL/min/1.42m^2   PHOSPHORUS   Result Value Ref Range    PHOSPHORUS 2.8 2.3 - 4.0 mg/dL   MAGNESIUM   Result Value Ref Range    MAGNESIUM 1.6 1.6 - 2.5 mg/dL   POC BLOOD GLUCOSE (RESULTS)   Result Value Ref Range    GLUCOSE, POC 257 (H) 70 - 105 mg/dL   POC BLOOD GLUCOSE (RESULTS)   Result Value Ref Range    GLUCOSE, POC 280 (H) 70 - 105 mg/dL   POC BLOOD GLUCOSE (RESULTS)   Result Value Ref Range    GLUCOSE, POC 228 (H) 70 - 105 mg/dL   AMYLASE BODY FLUID   Result Value Ref Range    AMYLASE BODY FLUID 28 No Reference Range Established U/L   BASIC METABOLIC PANEL   Result Value Ref Range    SODIUM 126 (L) 136 - 145 mmol/L    POTASSIUM 3.3 (L) 3.5 - 5.1 mmol/L    CHLORIDE 93 (L) 96 - 111 mmol/L    CO2 TOTAL 24 22 - 32 mmol/L    ANION GAP 9 4 - 13 mmol/L    CALCIUM 8.5 8.5 - 10.2 mg/dL    GLUCOSE 283 (H) 65 - 139 mg/dL    BUN 7 (L) 8 - 25 mg/dL    CREATININE 0.67 0.49 - 1.10 mg/dL    BUN/CREA RATIO 10 6 - 22    ESTIMATED GFR >59 >59 mL/min/1.59m^2   PHOSPHORUS   Result Value Ref Range    PHOSPHORUS 3.0 2.3 - 4.0 mg/dL   MAGNESIUM   Result Value Ref Range    MAGNESIUM 1.4 (L) 1.6 - 2.5 mg/dL   HEPATIC FUNCTION PANEL   Result Value Ref Range    ALBUMIN 2.5 (L) 3.4 - 4.8 g/dL    ALKALINE PHOSPHATASE 62 <150 U/L  ALT (SGPT) 27 <55 U/L    AST (SGOT) 20 8 - 41 U/L    BILIRUBIN TOTAL 0.9 0.3 - 1.3 mg/dL    BILIRUBIN DIRECT 0.4 (H) <0.3 mg/dL    PROTEIN TOTAL 4.8 (L) 6.0 - 8.0 g/dL   CBC WITH DIFF   Result Value Ref Range    WBC 8.7 3.5 - 11.0 x10^3/uL    RBC 3.56 (L) 3.63 - 4.92 x10^6/uL    HGB 10.4 (L) 11.2 - 15.2 g/dL    HCT 29.9 (L) 33.5 - 45.2 %    MCV 83.9 78.0 - 100.0 fL    MCH 29.1 27.4 - 33.0 pg    MCHC 34.7 32.5 - 35.8 g/dL    RDW 17.2 (H) 12.0 - 15.0 %    PLATELETS 140 140 - 450 x10^3/uL    MPV 9.9 7.5 - 11.5 fL    NEUTROPHIL % 81 %    LYMPHOCYTE % 7 %     MONOCYTE % 9 %    EOSINOPHIL % 3 %    BASOPHIL % 1 %    NEUTROPHIL # 7.03 1.50 - 7.70 x10^3/uL    LYMPHOCYTE # 0.56 (L) 1.00 - 4.80 x10^3/uL    MONOCYTE # 0.79 0.30 - 1.00 x10^3/uL    EOSINOPHIL # 0.23 0.00 - 0.50 x10^3/uL    BASOPHIL # 0.06 0.00 - 0.20 x10^3/uL   POC BLOOD GLUCOSE (RESULTS)   Result Value Ref Range    GLUCOSE, POC 278 (H) 70 - 105 mg/dL     Pathology:  Pending     Assessment/ Plan:   Active Hospital Problems   (*Primary Problem)    Diagnosis   . Pancreatic cancer (CMS [facility])     Ms. Schlender is a 71 yo F POD 3 s/p robotic whipple for pancreatic cancer.     - drain amylase on POD#3 was 28.    - Pain control  - Mechanical soft diet  - OOB  - Convert JP drain to bile bag  - Dispo: pending,.  PT/OT consulted yesterday afternoon     Jari Pigg, MD  10/28/2017, 10:34      I saw and examined the patient.  I reviewed the resident's note.  I agree with the findings and plan of care as documented in the resident's note.        Tolerating p.o.. No evidence of pancreas leak.   Ambulating.  Reports pain control.    Lynwood Dawley, MD

## 2017-10-28 NOTE — Nurses Notes (Signed)
Pt is alert and oriented. Ambulated to bathroom walker and stand by and is now resting comfortably in chair. Medications administered. Assessment completed per doc flow sheet. VS monitored. Only complaint is Incisional pain 8/10, PRN pain medication administered, will reassess. Safety maintained. Call light within reach, will continue to monitor.

## 2017-10-28 NOTE — Nurses Notes (Addendum)
Paged text Dr Halina Andreas regarding patient blood pressure manually 190/70 and by monitor 200/64 patient asymptomatic,  Awaits response or order.    Seen order Of Dr Wynetta Emery Hydralazine iv as PRN

## 2017-10-28 NOTE — Nurses Notes (Signed)
Service called and asked that I switch the compression bulb JP to a gravity bag. Task completed. Patent. Will continue to monitor output.

## 2017-10-28 NOTE — Nurses Notes (Signed)
Paged text Dr Dickie La regarding blood test result,awaits response/order.

## 2017-10-28 NOTE — Care Plan (Signed)
Problem: Patient Care Overview (Adult,OB)  Goal: Plan of Care Review(Adult,OB)  The patient and/or their representative will communicate an understanding of their plan of care   Outcome: Ongoing (see interventions/notes)  Patient had whipple procedure on the 12 th of November.Still had KP drain at the right lower abdomen and draining sero sanguinous fluid,sample sent for amylase .  Denies any pain nor discomfort.Instructed to call when needs assistance in going to toilet,call bell within reach.  Goal: Individualization/Patient Specific Goal(Adult/OB)  Outcome: Ongoing (see interventions/notes)      Problem: Perioperative Period (Adult)  Prevent and manage potential problems including:1. bleeding2. gastrointestinal complications3. hypothermia4. infection5. pain6. perioperative injury7. respiratory compromise8. situational response9. urinary retention10. venous [id number]. wound complications   Goal: Signs and Symptoms of Listed Potential Problems Will be Absent or Manageable (Perioperative Period)  Signs and symptoms of listed potential problems will be absent or manageable by discharge/transition of care (reference Perioperative Period (Adult) CPG).   Outcome: Ongoing (see interventions/notes)      Problem: Nutrition, Imbalanced: Inadequate Oral Intake (Adult)  Goal: Improved Oral Intake  Patient will demonstrate the desired outcomes by discharge/transition of care.   Outcome: Ongoing (see interventions/notes)    Goal: Prevent Further Weight Loss  Patient will demonstrate the desired outcomes by discharge/transition of care.   Outcome: Ongoing (see interventions/notes)

## 2017-10-29 DIAGNOSIS — Z9889 Other specified postprocedural states: Secondary | ICD-10-CM

## 2017-10-29 DIAGNOSIS — E1165 Type 2 diabetes mellitus with hyperglycemia: Secondary | ICD-10-CM

## 2017-10-29 LAB — CBC WITH DIFF
BASOPHIL #: 0.06 x10ˆ3/uL (ref 0.00–0.20)
BASOPHIL %: 1 %
EOSINOPHIL #: 0.43 x10ˆ3/uL (ref 0.00–0.50)
EOSINOPHIL %: 5 %
HCT: 30 % — ABNORMAL LOW (ref 33.5–45.2)
HCT: 30 % — ABNORMAL LOW (ref 33.5–45.2)
HGB: 10.1 g/dL — ABNORMAL LOW (ref 11.2–15.2)
LYMPHOCYTE #: 0.51 x10ˆ3/uL — ABNORMAL LOW (ref 1.00–4.80)
LYMPHOCYTE %: 6 %
MCH: 28.4 pg (ref 27.4–33.0)
MCHC: 33.6 g/dL (ref 32.5–35.8)
MCV: 84.6 fL (ref 78.0–100.0)
MONOCYTE #: 0.9 10*3/uL (ref 0.30–1.00)
MONOCYTE %: 10 %
MPV: 9.4 fL (ref 7.5–11.5)
NEUTROPHIL #: 7.34 x10ˆ3/uL (ref 1.50–7.70)
NEUTROPHIL %: 79 %
PLATELETS: 142 x10ˆ3/uL (ref 140–450)
RBC: 3.55 x10ˆ6/uL — ABNORMAL LOW (ref 3.63–4.92)
RDW: 17.1 % — ABNORMAL HIGH (ref 12.0–15.0)
WBC: 9.2 x10ˆ3/uL (ref 3.5–11.0)

## 2017-10-29 LAB — BASIC METABOLIC PANEL
ANION GAP: 9 mmol/L (ref 4–13)
BUN/CREA RATIO: 15 (ref 6–22)
BUN: 10 mg/dL (ref 8–25)
CALCIUM: 8.9 mg/dL (ref 8.5–10.2)
CHLORIDE: 91 mmol/L — ABNORMAL LOW (ref 96–111)
CO2 TOTAL: 24 mmol/L (ref 22–32)
CREATININE: 0.68 mg/dL (ref 0.49–1.10)
ESTIMATED GFR: >59 mL/min/{1.73_m2} (ref >59)
GLUCOSE: 235 mg/dL — ABNORMAL HIGH (ref 65–139)
POTASSIUM: 3.7 mmol/L (ref 3.5–5.1)
SODIUM: 124 mmol/L — ABNORMAL LOW (ref 136–145)

## 2017-10-29 LAB — POC BLOOD GLUCOSE (RESULTS)
GLUCOSE, POC: 240 mg/dL — ABNORMAL HIGH (ref 70–105)
GLUCOSE, POC: 225 mg/dL — ABNORMAL HIGH (ref 70–105)
GLUCOSE, POC: 290 mg/dL — ABNORMAL HIGH (ref 70–105)
GLUCOSE, POC: 290 mg/dL — ABNORMAL HIGH (ref 70–105)
GLUCOSE, POC: 124 mg/dL — ABNORMAL HIGH (ref 70–105)

## 2017-10-29 LAB — HEPATIC FUNCTION PANEL
ALBUMIN: 2.5 g/dL — ABNORMAL LOW (ref 3.4–4.8)
ALKALINE PHOSPHATASE: 62 U/L (ref <150)
AST (SGOT): 16 U/L (ref 8–41)
BILIRUBIN DIRECT: 0.5 mg/dL — ABNORMAL HIGH (ref <0.3)
BILIRUBIN TOTAL: 0.9 mg/dL (ref 0.3–1.3)

## 2017-10-29 LAB — PHOSPHORUS: PHOSPHORUS: 3.7 mg/dL (ref 2.3–4.0)

## 2017-10-29 LAB — HGA1C (HEMOGLOBIN A1C WITH EST AVG GLUCOSE)
ESTIMATED AVERAGE GLUCOSE: 166 mg/dL
HEMOGLOBIN A1C: 7.4 % — ABNORMAL HIGH (ref 4.0–6.0)

## 2017-10-29 LAB — MAGNESIUM: MAGNESIUM: 1.3 mg/dL — ABNORMAL LOW (ref 1.6–2.5)

## 2017-10-29 MED ORDER — INSULIN LISPRO (U-100) 100 UNIT/ML SUBCUTANEOUS SOLUTION
5.00 [IU] | Freq: Three times a day (TID) | SUBCUTANEOUS | Status: DC
Start: 2017-10-29 — End: 2017-10-29

## 2017-10-29 MED ORDER — FUROSEMIDE 10 MG/ML INJECTION SOLUTION
10.0000 mg | Freq: Once | INTRAMUSCULAR | Status: AC
Start: 2017-10-29 — End: 2017-10-29
  Administered 2017-10-29: 10 mg via INTRAVENOUS
  Filled 2017-10-29: qty 2

## 2017-10-29 MED ORDER — PANTOPRAZOLE 40 MG TABLET,DELAYED RELEASE
40.0000 mg | DELAYED_RELEASE_TABLET | Freq: Every morning | ORAL | Status: DC
Start: 2017-10-30 — End: 2017-11-16
  Administered 2017-10-30 – 2017-11-16 (×18): 40 mg via ORAL
  Filled 2017-10-29 (×18): qty 1

## 2017-10-29 MED ORDER — INSULIN GLARGINE (U-100) 100 UNIT/ML SUBCUTANEOUS SOLUTION
10.00 [IU] | SUBCUTANEOUS | Status: AC
Start: 2017-10-29 — End: 2017-10-29
  Administered 2017-10-29: 10 [IU] via SUBCUTANEOUS
  Filled 2017-10-29: qty 10

## 2017-10-29 MED ORDER — MAGNESIUM SULFATE 2 GRAM/50 ML (4 %) IN WATER INTRAVENOUS PIGGYBACK
2.0000 g | INJECTION | Freq: Once | INTRAVENOUS | Status: AC
Start: 2017-10-29 — End: 2017-10-29
  Administered 2017-10-29: 0 g via INTRAVENOUS
  Administered 2017-10-29: 2 g via INTRAVENOUS
  Filled 2017-10-29: qty 50

## 2017-10-29 MED ORDER — BISACODYL 10 MG RECTAL SUPPOSITORY
10.0000 mg | Freq: Once | RECTAL | Status: DC | PRN
Start: 2017-10-29 — End: 2017-10-31

## 2017-10-29 MED ORDER — INSULIN LISPRO (U-100) 100 UNIT/ML SUBCUTANEOUS SOLUTION
4.00 [IU] | Freq: Three times a day (TID) | SUBCUTANEOUS | Status: DC
Start: 2017-10-29 — End: 2017-10-30
  Administered 2017-10-29 – 2017-10-30 (×4): 4 [IU] via SUBCUTANEOUS
  Administered 2017-10-30: 0 [IU] via SUBCUTANEOUS

## 2017-10-29 MED ORDER — INSULIN GLARGINE (U-100) 100 UNIT/ML SUBCUTANEOUS SOLUTION
10.0000 [IU] | Freq: Every day | SUBCUTANEOUS | Status: DC
Start: 2017-10-30 — End: 2017-10-30
  Administered 2017-10-30: 10 [IU] via SUBCUTANEOUS
  Filled 2017-10-29: qty 10

## 2017-10-29 MED ADMIN — sodium chloride 0.9 % (flush) injection syringe: @ 22:00:00

## 2017-10-29 MED ADMIN — magnesium sulfate 4 gram/100 mL (4 %) in water intravenous piggyback: SUBCUTANEOUS | @ 11:00:00

## 2017-10-29 MED ADMIN — erythromycin 5 mg/gram (0.5 %) eye ointment: ORAL | @ 09:00:00 | NDC 24208091019

## 2017-10-29 MED ADMIN — lactated Ringers intravenous solution: SUBCUTANEOUS | @ 06:00:00

## 2017-10-29 MED ADMIN — EPINEPHRINE (1:1000) 1 MG/ML/ BALANCE SALT SOLUTION 500ML - CHI: @ 05:00:00 | NDC 00409724101

## 2017-10-29 NOTE — Progress Notes (Addendum)
Brief Endocrine Note, full H&P to follow.   Paged 9 am for consult.     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   BG 200-300 on SSI.   Has received 36 units in past 24 hrs.   Body mass index is 35.07 kg/(m^2).     A/P:  Pancreaprivic Diabetes. Phenotypically like T1DM    Recommend:  I HAVE PLACED THE ORDERS.   Lantus 10 units QHS  Schedule Humalog 4 units TID AC.   Continue SSI.   Endocrine will follow.     Cleatrice Burke, DO

## 2017-10-29 NOTE — Progress Notes (Addendum)
[name], 71 y.o. female  Date of Admission:  10/25/2017  Date of Birth:  04/17/1946  Date of Service:  10/29/2017    Post Op Day: 4 Days Post-Op S/P Procedure(s) (LRB):  ROBOTIC WHIPPLE (N/A)  BLOCK PRE-OP (N/A)  XI ROBOT SUPPLY CARD (N/A)    Chief Complaint:Pancreatic cancer (CMS [place])    Subjective: NAEO. Denies F/C, CP, SOB, N/V. Tolerating diet. No BM yet.    Objective:  Filed Vitals:    10/28/17 2000 10/29/17 0000 10/29/17 0400 10/29/17 0754   BP: (!) 161/61 (!) 155/74 (!) 148/60    Pulse: 77 78 89    Resp: 16 16 (!) 9    Temp:  36.9 C (98.4 F) 36.7 C (98 F) 36.7 C (98.1 F)   SpO2: 97% 95% 95%      Pain Score (Numeric, Faces): 7      Current Medications:    Current Facility-Administered Medications:  atorvastatin (LIPITOR) tablet 10 mg Oral QPM   bisacodyl (DULCOLAX) rectal suppository 10 mg Rectal Once PRN   citalopram (CELEXA) tablet 20 mg Oral Daily   docusate sodium (COLACE) capsule 100 mg Oral 2x/day   enoxaparin PF (LOVENOX) 40 mg/0.4 mL SubQ injection 40 mg Subcutaneous Q24H   furosemide (LASIX) 10 mg/mL injection 10 mg Intravenous Once   hydrALAZINE (APRESOLINE) injection 10 mg 10 mg Intravenous Q4H PRN   hydroCHLOROthiazide (HYDRODIURIL) tablet 25 mg Oral Daily   insulin glargine (LANTUS) 100 units/mL injection 10 Units Subcutaneous Now   insulin lispro (HUMALOG) 100 units/mL injection 4 Units Subcutaneous 3x/day AC   LORazepam (ATIVAN) tablet 0.5 mg Oral Daily PRN   losartan (COZAAR) tablet 100 mg Oral Daily   metoprolol succinate (TOPROL-XL) 24 hr extended release tablet 50 mg Oral Daily   NS flush syringe 2 mL Intracatheter Q8HRS   And      NS flush syringe 2-6 mL Intracatheter Q1 MIN PRN   ondansetron (ZOFRAN) 2 mg/mL injection 4 mg Intravenous Q6H PRN   oxyCODONE (ROXICODONE) immediate release tablet 5 mg Oral Q4H PRN   Or      oxyCODONE (ROXICODONE) immediate release tablet 10 mg Oral  Q4H PRN   potassium chloride 77meq per 14mL oral liquid 40 mEq Oral Daily with Breakfast   sennosides-docusate sodium (SENOKOT-S) 8.6-50mg  per tablet 1 Tab Oral 2x/day   SSIP insulin lispro (HUMALOG) 100 units/mL injection 3-9 Units Subcutaneous 4x/day PRN       Today's Physical Exam:  Constitutional: Appears in no acute distress  Eyes: Conjunctiva clear. Pupils equal and round. Sclera non-icteric.   ENT: ENMT without erythema or injection, mucous membranes moist.  Neck: Supple, symmetrical, trachea midline  Respiratory: Even and non-labored breathing  Cardiovascular: Regular rate and rhythm  Gastrointestinal: Soft, non-tender, non-distended  Neurologic: Grossly normal, Alert and oriented x3  Psychiatric: Normal    I/O:    Date 10/28/17 0700 - 10/29/17 0659 10/29/17 0700 - 10/30/17 0659   Shift 0700-1459 1500-2259 2300-0659 24 Hour Total 0700-1459 1500-2259 2300-0659 24 Hour Total   I  N  T  A  K  E   P.O. 260 350 350 960 110   110      Oral 260 350 350  960 110   110    Shift Total  (mL/kg) 260  (3.03) 350  (4.07) 350  (4.16) 960  (11.4) 110  (1.31)   110  (1.31)   O  U  T  P  U  T   Urine  (mL/kg/hr) 475  (0.69) 275  (0.4)  750  (0.37) 225   225      Urine (Voided) 475 275  750 225   225      Urine Occurrence  1 x  1 x        Emesis              Emesis Occurrence  0 x 0 x 0 x 1 x   1 x    Drains 400 150 210 760 110   110      Drain Output (Drain (Miscellaneous) blake drain Right Abdomen) 400 150 210 760 110   110    Stool              Stool Occurrence  0 x 0 x 0 x 0 x   0 x    Shift Total  (mL/kg) 875  (10.19) 425  (4.95) 210  (2.49) 1510  (17.93) 335  (3.98)   335  (3.98)   Weight (kg) 85.9 85.9 84.2 84.2 84.2 84.2 84.2 84.2       Nutrition:  MNT PROTOCOL FOR DIETITIAN  DIETARY ORAL SUPPLEMENTS Oral Supplements with tray: Ensure High Protein-Vanilla; BREAKFAST/LUNCH/DINNER; 2 Cans  DIET DIABETIC Calorie amount: CC 2000; Additional modifications/limitations: LOW FAT, LOW CHOLESTEROL; Consistency/thickening:  MECHANICAL SOFT (Ground meat/Soft foods)    Labs:  I have reviewed all lab values.   CBC   Recent Labs      10/26/17   1304  10/27/17   0324  10/28/17   0521  10/29/17   0507   WBC   --   8.9  8.7  9.2   HGB  10.5*  9.9*  10.4*  10.1*   HCT  31.4*  29.7*  29.9*  30.0*   PLTCNT   --   136*  140  142      Diff   Recent Labs      10/27/17   0324  10/28/17   0521  10/29/17   0507   PMNS  81  81  79   LYMPHOCYTES  7  7  6    MONOCYTES  10  9  10    EOSINOPHIL  2  3  5    BASOPHILS  1  0.05  1  0.06  1  0.06   PMNABS  7.22  7.03  7.34   LYMPHSABS  0.63*  0.56*  0.51*   EOSABS  0.16  0.23  0.43   MONOSABS  0.86  0.79  0.90        BMP   Recent Labs      10/27/17   0324  10/27/17   1239  10/28/17   0521  10/29/17   0507   SODIUM  127*  128*  126*  124*   POTASSIUM  2.9*  4.7  3.3*  3.7   CHLORIDE  96  96  93*  91*   CO2  23  24  24  24    BUN  6*  7*  7*  10   CREATININE  0.60  0.66  0.67  0.68   GLUCOSENF  193*  221*  283*  235*   ANIONGAP  8  8  9  9    BUNCRRATIO  10  11  10  15    GFR  >59  >59  >59  >59        Assessment/ Plan:   Active Hospital Problems   (*Primary Problem)    Diagnosis   . Pancreatic cancer (CMS [facility])       [name] is a 71 y.o. female 4 Days Post-Op robotic whipple for pancreatic cancer    POD: 4 Days Post-Op  Diet/fluids: diabetic regular / none  Activity: OOB  Pain control: roxicodone  DVT ppx: lovenox  Bowel reg/GI ppx: dulcolax + colace, suppository today / protonix  Last BM: 10/24/17  PT/OT: ordered  Lines: port, PIV, drain  Dispo: TBD    Plans: endocrine consult to help manage glucose, OOB, gave suppository for BM, dispo planning    Cyrus J. Bertram Denver, MD  10/29/2017 11:53      I saw and examined the patient.  I reviewed the resident's note.  I agree with the findings and plan of care as documented in the resident's note.      Doing very well.  Drain clearing up.  Put to gravity.  Blood sugars elevated, endocrine consult    Lynwood Dawley, MD

## 2017-10-29 NOTE — Nurses Notes (Signed)
Pt PIV expires 10-29-17 @0717 .  PIV continues to flush appropriately, remains in vessel.  Nursing judgement to not remove PIV.  Will continue to monitor pt

## 2017-10-29 NOTE — Consults (Signed)
[place] [facility]  H&P     Date:10/29/2017   Historian: Patient and EHR   GENERAL DATA:   Patient Name: [name]  Age: 71 y.o.  [id number]  DOB: 05-12-1946  Referring Physician: No Pcp  PCP: No Pcp  CC:   Hyperglycemia s/p Whipple   HPI:   [name] is a 71 y.o. year old female admitted to [facility] for elective Whipple procedure s/p diagnosis of pancreatic adenocarcinoma 10/25/2017.     BG ranging from 225-310 in past 24 hrs. She is currently treated with SSI and has received 36 u in past 24 hours.     There is no A1c in the chart.   She reports being diagnosed with diabetes back in 2000 and was taking Metformin and Glipizide at home. She has never been on insulin therapy.   She reports any hx of retinopathy. She has a hx of PN.     Reports that her mother had T2DM.   Lives with her sister.     MEDICATIONS :       Current Facility-Administered Medications:  atorvastatin (LIPITOR) tablet 10 mg Oral QPM   bisacodyl (DULCOLAX) rectal suppository 10 mg Rectal Once PRN   citalopram (CELEXA) tablet 20 mg Oral Daily   docusate sodium (COLACE) capsule 100 mg Oral 2x/day   enoxaparin PF (LOVENOX) 40 mg/0.4 mL SubQ injection 40 mg Subcutaneous Q24H   hydrALAZINE (APRESOLINE) injection 10 mg 10 mg Intravenous Q4H PRN   hydroCHLOROthiazide (HYDRODIURIL) tablet 25 mg Oral Daily   [START ON 10/30/2017] insulin glargine (LANTUS) 100 units/mL injection 10 Units Subcutaneous Daily   insulin lispro (HUMALOG) 100 units/mL injection 4 Units Subcutaneous 3x/day AC   LORazepam (ATIVAN) tablet 0.5 mg Oral Daily PRN   losartan (COZAAR) tablet 100 mg Oral Daily   metoprolol succinate (TOPROL-XL) 24 hr extended release tablet 50 mg Oral Daily   NS flush syringe 2 mL Intracatheter Q8HRS   And      NS flush syringe 2-6 mL Intracatheter Q1 MIN PRN   ondansetron (ZOFRAN) 2 mg/mL injection 4 mg Intravenous Q6H PRN   oxyCODONE (ROXICODONE) immediate release tablet 5 mg Oral Q4H PRN   Or      oxyCODONE (ROXICODONE) immediate  release tablet 10 mg Oral Q4H PRN   [START ON 10/30/2017] pantoprazole (PROTONIX) delayed release tablet 40 mg Oral Daily before Breakfast   potassium chloride 26meq per 63mL oral liquid 40 mEq Oral Daily with Breakfast   sennosides-docusate sodium (SENOKOT-S) 8.6-50mg  per tablet 1 Tab Oral 2x/day   SSIP insulin lispro (HUMALOG) 100 units/mL injection 3-9 Units Subcutaneous 4x/day PRN       PAST MEDICAL HISTORY:     Past Medical History:   Diagnosis Date    Abdominal hernia 10/18/2017    hx of repair    Anxiety     Arthritis     Asthma     Atrial fibrillation (CMS HCC)     Back problem     Bruises easily     Cancer (CMS [place]) 10/18/2017    chemo and radiation completed 09/24/2017    COPD (chronic obstructive pulmonary disease) (CMS HCC)     CPAP (continuous positive airway pressure) dependence 10/18/2017    has not used recently    Depression     DM (diabetes mellitus) (CMS [place]) 2000    Fasting BG 200's. HGA1C 2018 6    Edema     Essential hypertension     GERD (gastroesophageal reflux disease)  Headache     Heartburn     Hx antineoplastic chemo 2018    Hx of radiation therapy 2018    Hypercholesteremia     Hyperlipidemia     Migraine 10/18/2017    none recently    Obesity     Palpitations     Pancreatic cancer (CMS [place]) 03/2017    Panic attack     Peripheral neuropathy 10/18/2017    knees down, bilateral    Sleep apnea     Type 2 diabetes mellitus (CMS [place]) 10/18/2017    Dx 2000 FBS 200s    Unintentional weight loss 10/18/2017    25 lbs 03/2017    Wears glasses           SURGICAL HISTORY:     Past Surgical History:   Procedure Laterality Date    BLOCK PRE-OP N/A 10/25/2017    Performed by Nicola Police, MD at [facility]      HX HERNIA REPAIR      HX SUBCLAVIAN PORT IMPLANTION      s/p removed    HX SUBCLAVIAN PORT IMPLANTION      HX TONSIL AND ADENOIDECTOMY      HX TONSILLECTOMY      ROBOTIC WHIPPLE N/A 10/25/2017    Performed by Nicola Police, MD at [facility]      x2    XI [place] N/A 10/25/2017    Performed by Nicola Police, MD at [facility]:     Allergies   Allergen Reactions    Sulfa (Sulfonamides) Itching     SOCIAL HISTORY:      Social History     Social History    Marital status: Widowed     Spouse name: N/A    Number of children: N/A    Years of education: N/A     Occupational History    Not on file.     Social History Main Topics    Smoking status: Former Smoker     Packs/day: 0.50     Years: 20.00     Quit date: 10/18/1993    Smokeless tobacco: Never Used    Alcohol use No    Drug use: No    Sexual activity: Not on file     Other Topics Concern    Ability To Walk 1 Flight Of Steps Without Sob/Cp No     SOB, denies CP    Ability To Walk 2 Flight Of Steps Without Sob/Cp No    Ability To Do Own Adl's Yes    Other Activity Level Yes     light housework     Social History Narrative       FAMILY HISTORY:     Family Medical History:     None            REVIEW OF SYSTEMS:   In addition to HPI...  Constitutional: negative  Eyes: negative  Ears, nose, mouth, throat, and face: negative  Respiratory: negative  Cardiovascular: negative  Gastrointestinal: negative  Genitourinary:negative  Integument/breast: negative  Hematologic/lymphatic: negative  Musculoskeletal:negative  Neurological: negative  Behavioral/Psych: negative  Endocrine: negative  Allergic/Immunologic: negative  All other ROS Negative    PHYSICAL EXAM:   BP (!) 152/60   Pulse 83   Temp 36.6 C (97.9 F)   Resp 16  Ht 1.549 m (5\' 1" )   Wt 84.2 kg (185 lb 10 oz)   SpO2 95%   BMI 35.07 kg/m2  General: appears acutely ill.   Eyes:  pupils equal and round, conjunctiva clear, sclera non-icteric   HENT:  normocephalic   Neck: no adenopathy, thyroid not enlarged, symmetric, no tenderness/mass/nodules  Lungs: clear to auscultation bilaterally   Cardiovascular: RRR  Abdomen: JP RLE, abdomen soft non  tender  Extremities: no cyanosis or edema  Skin: warm and dry  Neurologic: reflexes 2+ globally   Lymphatics: no submandibular, sunlingual, anterior cervical, or supraclavicular lymphadenopathy  Psychiatric:  normal affect, behavior, thought content, and speech.  IMAGING:   Lakia Hanlon  Female, 71 years old.    XR CHEST PA AND LATERAL performed on 10/18/2017 5:22 PM.    REASON FOR EXAM:  C25.9: Malignant neoplasm of pancreas, unspecified  location of malignancy (CMS HCC)    TECHNIQUE: 2 views/3 images submitted for interpretation.    COMPARISON:  None    FINDINGS:  There is no focal consolidation, pleural effusion, or  pneumothorax. The heart size.    Support devices:  Right IJ medication port with catheter tip projecting over the upper SVC.    IMPRESSION:  Unremarkable chest radiographs.  LABS:   No results found for: HA1C  BMP  (Last 48 hours)    Date/Time Na K Cl CO2 BUN CREAT Calcium Glucose    10/29/17 0507 124 (L)    3.7    91 (L)    24    10    0.68    8.9    235 (H)       10/28/17 0521 126 (L)    3.3 (L)    93 (L)    24    7 (L)    0.67    8.5    283 (H)             ASSESSMENT/RECOMMENDATIONS:    Hyperglycemia s/p Whipple.    Recommend:   Please check A1c (I have ordered)    Begin Lantus 10 u QD (I have ordered)   Begin scheduled Humalog 4 u TID AC- hold mealtime insulin if meal is skipped   Continue SSI   BG QID    Continue Diabetic Diet   Patient will need diabetic ed and insulin injection teaching prior to DC home   Please dc with BG meter, lancets, strips, and insulin pens.     DR. Ellie Lunch TO TAKE OVER THE SERVICE 11/19.     Cleatrice Burke, DO   Clinical Assistant Professor  Department of Endocrinology   10/29/2017  Pager: [phone]

## 2017-10-30 LAB — CBC WITH DIFF
BASOPHIL #: 0.04 10*3/uL (ref 0.00–0.20)
BASOPHIL %: 0 %
EOSINOPHIL #: 0.63 10*3/uL — ABNORMAL HIGH (ref 0.00–0.50)
EOSINOPHIL %: 6 %
HCT: 29 % — ABNORMAL LOW (ref 33.5–45.2)
HGB: 9.7 g/dL — ABNORMAL LOW (ref 11.2–15.2)
LYMPHOCYTE #: 0.52 10*3/uL — ABNORMAL LOW (ref 1.00–4.80)
LYMPHOCYTE %: 5 %
MCH: 28.5 pg (ref 27.4–33.0)
MCHC: 33.6 g/dL (ref 32.5–35.8)
MCV: 84.8 fL (ref 78.0–100.0)
MONOCYTE #: 1.15 10*3/uL — ABNORMAL HIGH (ref 0.30–1.00)
MONOCYTE %: 11 %
MPV: 9.6 fL (ref 7.5–11.5)
NEUTROPHIL #: 8.36 10*3/uL — ABNORMAL HIGH (ref 1.50–7.70)
NEUTROPHIL %: 78 %
PLATELETS: 156 10*3/uL (ref 140–450)
RBC: 3.41 10*6/uL — ABNORMAL LOW (ref 3.63–4.92)
RDW: 17 % — ABNORMAL HIGH (ref 12.0–15.0)
WBC: 10.7 10*3/uL (ref 3.5–11.0)

## 2017-10-30 LAB — BASIC METABOLIC PANEL
ANION GAP: 9 mmol/L (ref 4–13)
BUN/CREA RATIO: 24 — ABNORMAL HIGH (ref 6–22)
BUN: 17 mg/dL (ref 8–25)
CALCIUM: 8.9 mg/dL (ref 8.5–10.2)
CHLORIDE: 89 mmol/L — ABNORMAL LOW (ref 96–111)
CO2 TOTAL: 26 mmol/L (ref 22–32)
CREATININE: 0.72 mg/dL (ref 0.49–1.10)
ESTIMATED GFR: >59 mL/min/{1.73_m2} (ref >59)
GLUCOSE: 207 mg/dL — ABNORMAL HIGH (ref 65–139)
POTASSIUM: 3.6 mmol/L (ref 3.5–5.1)
SODIUM: 124 mmol/L — ABNORMAL LOW (ref 136–145)

## 2017-10-30 LAB — HEPATIC FUNCTION PANEL
ALBUMIN: 2.3 g/dL — ABNORMAL LOW (ref 3.4–4.8)
ALKALINE PHOSPHATASE: 53 U/L (ref <150)
ALT (SGPT): 13 U/L (ref <55)
AST (SGOT): 13 U/L (ref 8–41)
BILIRUBIN TOTAL: 0.7 mg/dL (ref 0.3–1.3)
PROTEIN TOTAL: 4.6 g/dL — ABNORMAL LOW (ref 6.0–8.0)

## 2017-10-30 LAB — HISTORICAL SURGICAL PATHOLOGY SPECIMEN

## 2017-10-30 LAB — POC BLOOD GLUCOSE (RESULTS)
GLUCOSE, POC: 238 mg/dL — ABNORMAL HIGH (ref 70–105)
GLUCOSE, POC: 238 mg/dL — ABNORMAL HIGH (ref 70–105)
GLUCOSE, POC: 200 mg/dL — ABNORMAL HIGH (ref 70–105)
GLUCOSE, POC: 124 mg/dL — ABNORMAL HIGH (ref 70–105)
GLUCOSE, POC: 262 mg/dL — ABNORMAL HIGH (ref 70–105)

## 2017-10-30 LAB — PHOSPHORUS
PHOSPHORUS: 5.1 mg/dL — ABNORMAL HIGH (ref 2.3–4.0)
PHOSPHORUS: 5.1 mg/dL — ABNORMAL HIGH (ref 2.3–4.0)

## 2017-10-30 LAB — MAGNESIUM: MAGNESIUM: 1.3 mg/dL — ABNORMAL LOW (ref 1.6–2.5)

## 2017-10-30 MED ORDER — INSULIN LISPRO (U-100) 100 UNIT/ML SUBCUTANEOUS SOLUTION
7.00 [IU] | Freq: Three times a day (TID) | SUBCUTANEOUS | Status: DC
Start: 2017-10-31 — End: 2017-11-02
  Administered 2017-10-31 (×2): 7 [IU] via SUBCUTANEOUS
  Administered 2017-10-31 – 2017-11-01 (×2): 0 [IU] via SUBCUTANEOUS
  Administered 2017-11-01 – 2017-11-02 (×2): 7 [IU] via SUBCUTANEOUS

## 2017-10-30 MED ORDER — POLYETHYLENE GLYCOL 3350 17 GRAM ORAL POWDER PACKET
17.0000 g | Freq: Every day | ORAL | Status: DC
Start: 2017-10-30 — End: 2017-11-16
  Administered 2017-10-30 – 2017-11-03 (×5): 17 g via ORAL
  Administered 2017-11-04 – 2017-11-05 (×2): 0 g via ORAL
  Administered 2017-11-06 – 2017-11-07 (×2): 17 g via ORAL
  Administered 2017-11-08 – 2017-11-10 (×3): 0 g via ORAL
  Administered 2017-11-11: 17 g via ORAL
  Administered 2017-11-12 – 2017-11-14 (×4): 0 g via ORAL
  Administered 2017-11-15: 17 g via ORAL
  Administered 2017-11-16: 0 g via ORAL
  Filled 2017-10-30 (×11): qty 1

## 2017-10-30 MED ORDER — MAGNESIUM SULFATE 2 GRAM/50 ML (4 %) IN WATER INTRAVENOUS PIGGYBACK
2.0000 g | INJECTION | Freq: Once | INTRAVENOUS | Status: AC
Start: 2017-10-30 — End: 2017-10-30
  Administered 2017-10-30: 2 g via INTRAVENOUS
  Administered 2017-10-30: 0 g via INTRAVENOUS
  Filled 2017-10-30: qty 50

## 2017-10-30 MED ORDER — INSULIN GLARGINE (U-100) 100 UNIT/ML SUBCUTANEOUS SOLUTION
10.00 [IU] | SUBCUTANEOUS | Status: AC
Start: 2017-10-30 — End: 2017-10-30
  Administered 2017-10-30: 10 [IU] via SUBCUTANEOUS
  Filled 2017-10-30 (×2): qty 10

## 2017-10-30 MED ORDER — SODIUM CHLORIDE 1 GRAM TABLET
1.00 g | ORAL_TABLET | Freq: Three times a day (TID) | ORAL | Status: AC
Start: 2017-10-30 — End: 2017-10-30
  Administered 2017-10-30 (×3): 1 g via ORAL
  Filled 2017-10-30 (×3): qty 1

## 2017-10-30 MED ORDER — INSULIN GLARGINE (U-100) 100 UNIT/ML SUBCUTANEOUS SOLUTION
20.00 [IU] | Freq: Every evening | SUBCUTANEOUS | Status: DC
Start: 2017-10-31 — End: 2017-11-02
  Administered 2017-10-31 – 2017-11-01 (×2): 20 [IU] via SUBCUTANEOUS
  Filled 2017-10-30 (×2): qty 20

## 2017-10-30 MED ADMIN — magnesium sulfate 2 gram/50 mL (4 %) in water intravenous piggyback: INTRAVENOUS | @ 18:00:00

## 2017-10-30 MED ADMIN — insulin lispro (U-100) 100 unit/mL subcutaneous solution: SUBCUTANEOUS | @ 07:00:00

## 2017-10-30 MED ADMIN — insulin glargine (U-100) 100 unit/mL subcutaneous solution: SUBCUTANEOUS | @ 08:00:00

## 2017-10-30 MED ADMIN — sodium chloride 0.9 % intravenous solution: ORAL | @ 08:00:00 | NDC 00338004904

## 2017-10-30 MED ADMIN — erythromycin 5 mg/gram (0.5 %) eye ointment: @ 17:00:00 | NDC 17478082401

## 2017-10-30 MED ADMIN — sodium chloride 0.9 % (flush) injection syringe: ORAL | @ 08:00:00

## 2017-10-30 NOTE — Care Plan (Signed)
[facility]  Physical Therapy Initial Evaluation    Patient Name: [name]  Date of Birth: 1946-01-28  Height: Height: 154.9 cm (5\' 1" )  Weight: Weight: 84.2 kg (185 lb 10 oz)  Room/Bed: 975/A  Payor: MEDICARE / Plan: MEDICARE PART A AND B / Product Type: Medicare /     Assessment:      Patient with impaired functional mobility secondary to disease process, s/p surgery. Will benefit from continued skilled PT interventions to assist return to baseline. Will follow for progressive strengthening, mobility, balance training.    Discharge Needs:    Equipment Recommendation: none anticipated      Discharge Disposition: home with home health    JUSTIFICATION OF DISCHARGE RECOMMENDATION   Based on current diagnosis, functional performance prior to admission, and current functional performance, this patient requires continued PT services in home with home health in order to achieve significant functional improvements in these deficit areas: aerobic capacity/endurance, gait, locomotion, and balance.        Plan:   Current Intervention: balance training, bed mobility training, gait training, home exercise program, strengthening, transfer training  To provide physical therapy services minimum of 2x/week  for duration of until discharge.    The risks/benefits of therapy have been discussed with the patient/caregiver and he/she is in agreement with the established plan of care.       Subjective & Objective      10/30/17 1135   Therapist Pager   PT Assigned/ Pager # [phone]   Rehab Session   Document Type evaluation   Total PT Minutes: 21   Patient Effort good   General Information   Patient Profile Reviewed? yes   Onset of Illness/Injury or Date of Surgery 10/25/17   Patient/Family Observations pt has been getting up with nursing staff   Pertinent History of Current Functional Problem presents for scheduled robotic Whipple secondary to pancreatic cancer   Respiratory Status room air      Mutuality/Individual Preferences   How to Address Anxieties/Fears recommend OOB with supervision x1, ambulate TID with supervision of staff and use of Black With sibling(s)   Living Arrangements house   Living Environment Comment ramps to enter the home, sister is available to assist with IADLs. Another sister can assist with transportation   Functional Level Prior   Ambulation 1 - assistive equipment   Transferring 1 - assistive equipment   Prior Functional Level Comment Patient ambulatory with use of SPC PTA, has rollator available for use as well.   Self-Care   Equipment Currently Used at Home walker, four wheeled;cane, straight   Pre Treatment Status   Pre Treatment Patient Status Patient supine in bed   Communication Pre Treatment  Nurse   Cognitive Assessment/Interventions   Behavior/Mood Observations flat affect;cooperative   Orientation Status  oriented x 4   Attention WNL/WFL   Follows Commands  WNL   Pain Assessment   Pre/Post Treatment Pain Comment reports incisional pain worse when lying down and sitting in chair   RUE Assessment   RUE Assessment WFL- Within Functional Limits   LUE Assessment   LUE Assessment WFL- Within Functional Limits   RLE Assessment   RLE Assessment WFL- Within Functional Limits   LLE Assessment   LLE Assessment WFL- Within Functional Limits   Bed Mobility Assessment/Treatment   Bed Mobility, Assistive Device bed rails   Supine-Sit Independence minimum assist (75% patient effort)   Comment  Cued patient to perform log roll technique to reduce abdominal strain during sup>sit, patient with poor execution, reaching for BUE support of PT/OT.   Transfer Assessment/Treatment   Sit-Stand Independence  contact guard assist   Stand-Sit Independence  contact guard assist   Sit-Stand-Sit, Assist Device front wheeled walker   Bed-Chair Independence contact guard assist   Transfer Comment cued patient for hand placement for safety. cues for direction, positioning,  sequencing   Gait Assessment/Treatment   Independence  contact guard assist   Assistive Device  rolling walker   Distance in Feet 338ft   Total Distance Ambulated 300   Comment Patient with slow pace, appropriate BOS and step length. No LOB or instability noted.    Balance Skill Training   Sitting Balance: Static good balance   Sitting, Dynamic (Balance) good balance   Sit-to-Stand Balance fair + balance   Standing Balance: Static fair + balance   Standing Balance: Dynamic fair + balance   Therapeutic Exercise/Activity   Comment encouraged increased activity to improve generalized strengthening with supervision of staff   Post Treatment Status   Post Treatment Patient Status Patient sitting in bedside chair or w/c   Support Present Post Treatment  Nurse present   Communication Post Treatement Nurse   Plan of Care Review   Plan Of Care Reviewed With patient   Physical Therapy Clinical Impression   Assessment Patient with impaired functional mobility secondary to disease process, s/p surgery. Will benefit from continued skilled PT interventions to assist return to baseline. Will follow for progressive strengthening, mobility, balance training.   Criteria for Skilled Therapeutic yes   Pathology/Pathophysiology Noted musculoskeletal   Impairments Found (describe specific impairments) aerobic capacity/endurance;gait, locomotion, and balance   Functional Limitations in Following  self-care   Disability: Inability to Perform community/leisure   Rehab Potential good, to achieve stated therapy goals   Therapy Frequency minimum of 2x/week   Predicted Duration of Therapy Intervention (days/wks) until discharge   Anticipated Equipment Needs at Discharge (PT Clinical Impression) none anticipated   Anticipated Discharge Disposition  home with home health   Evaluation Complexity Justification   Patient History: Co-morbity/factors that Impact Plan of Care 3 or more that implact Plan of Care   Examination Components 3 or more Exam  elements addressed   Presentation Stable: Uncomplicated, straight-forward, problem focused   Clinical Decision Making Moderate complexity   Evaluation Complexity Moderate complexity   Care Plan Goals   PT Rehab Goals Bed Mobility Goal;Gait Training Goal;Strength Goal;Transfer Training Goal   Bed Mobility Goal   Bed Mobility Goal, Date Established 10/30/17   Bed Mobility Goal, Time to Achieve by discharge   Bed Mobility Goal, Activity Type all bed mobility activities   Bed Mobility Goal, Independence Level modified independence   Bed Mobility Goal, Assistive Device least restrictive assistive device   Gait Training  Goal, Distance to Achieve   Gait Training  Goal, Date Established 10/30/17   Gait Training  Goal, Time to Achieve by discharge   Gait Training  Goal, Independence Level modified independence   Gait Training  Goal, Assist Device least restricted assistive device   Gait Training  Goal, Distance to Achieve 323ft   Strength Goal   Strength Goal, Date Established 10/30/17   Strength Goal, Time to Achieve by discharge   Strength Goal, Measure to Achieve 5/5 BLE   Transfer Training Goal   Transfer Training Goal, Date Established 10/30/17   Transfer Training Goal, Time to Achieve by discharge  Transfer Training Goal, Activity Type all transfers   Transfer Training Goal, Independence Level modified independence   Transfer Training Goal, Assist Device least restrictrictive assistive device   Planned Therapy Interventions, PT Eval   Planned Therapy Interventions (PT Eval) balance training;bed mobility training;gait training;home exercise program;strengthening;transfer training       Therapist:   Stephani Police, PT   Pager #: [phone]

## 2017-10-30 NOTE — Progress Notes (Signed)
[name], 71 y.o. female  Date of Admission:  10/25/2017  Date of Birth:  Sep 10, 1946  Date of Service:  10/30/2017    Post Op Day: 5 Days Post-Op S/P Procedure(s) (LRB):  ROBOTIC WHIPPLE (N/A)  BLOCK PRE-OP (N/A)  XI ROBOT SUPPLY CARD (N/A)    Chief Complaint:Pancreatic cancer (CMS [place])    Subjective: NAEO. Patient reports nausea this AM. No BM yet.    Objective:  Filed Vitals:    10/30/17 0330 10/30/17 0739 10/30/17 0742 10/30/17 0810   BP: (!) 139/56 (!) 112/58  (!) 101/55   Pulse: 78 83  82   Resp: (!) 8 (!) 6     Temp: 36.7 C (98.1 F)  37.5 C (99.5 F)    SpO2: 95% 94%  95%     Pain Score (Numeric, Faces): 0      Current Medications:    Current Facility-Administered Medications:  atorvastatin (LIPITOR) tablet 10 mg Oral QPM   bisacodyl (DULCOLAX) rectal suppository 10 mg Rectal Once PRN   citalopram (CELEXA) tablet 20 mg Oral Daily   docusate sodium (COLACE) capsule 100 mg Oral 2x/day   enoxaparin PF (LOVENOX) 40 mg/0.4 mL SubQ injection 40 mg Subcutaneous Q24H   hydrALAZINE (APRESOLINE) injection 10 mg 10 mg Intravenous Q4H PRN   hydroCHLOROthiazide (HYDRODIURIL) tablet 25 mg Oral Daily   insulin glargine (LANTUS) 100 units/mL injection 10 Units Subcutaneous Daily   insulin lispro (HUMALOG) 100 units/mL injection 4 Units Subcutaneous 3x/day AC   LORazepam (ATIVAN) tablet 0.5 mg Oral Daily PRN   losartan (COZAAR) tablet 100 mg Oral Daily   metoprolol succinate (TOPROL-XL) 24 hr extended release tablet 50 mg Oral Daily   NS flush syringe 2 mL Intracatheter Q8HRS   And      NS flush syringe 2-6 mL Intracatheter Q1 MIN PRN   ondansetron (ZOFRAN) 2 mg/mL injection 4 mg Intravenous Q6H PRN   oxyCODONE (ROXICODONE) immediate release tablet 5 mg Oral Q4H PRN   Or      oxyCODONE (ROXICODONE) immediate release tablet 10 mg Oral Q4H PRN   pantoprazole (PROTONIX) delayed release tablet 40 mg Oral Daily before  Breakfast   potassium chloride 27meq per 53mL oral liquid 40 mEq Oral Daily with Breakfast   sennosides-docusate sodium (SENOKOT-S) 8.6-50mg  per tablet 1 Tab Oral 2x/day   sodium chloride tablet 1 g Oral 3x/day-Meals   SSIP insulin lispro (HUMALOG) 100 units/mL injection 3-9 Units Subcutaneous 4x/day PRN       Today's Physical Exam:  Constitutional: Appears in no acute distress  Eyes: Conjunctiva clear. Pupils equal and round. Sclera non-icteric.   ENT: ENMT without erythema or injection, mucous membranes moist.  Neck: Supple, symmetrical, trachea midline  Respiratory: breathing unlabored  Cardiovascular: Regular rate and rhythm  Gastrointestinal: Soft, incisions c/d/i, appropriately TTP  Neurologic: Grossly normal, Alert and oriented x3  Psychiatric: Normal    I/O:    Date 10/29/17 0700 - 10/30/17 0659 10/30/17 0700 - 10/31/17 0659   Shift 0700-1459 1500-2259 2300-0659 24 Hour Total 0700-1459 1500-2259 2300-0659 24 Hour Total   I  N  T  A  K  E   P.O. 110 240 0 350 100   100  Oral 110 240 0 350 100   100    Shift Total  (mL/kg) 110  (1.31) 240  (2.85) 0  (0) 350  (4.16) 100  (1.19)   100  (1.19)   O  U  T  P  U  T   Urine  (mL/kg/hr) 225  (0.33) 300  (0.45)  525  (0.26)          Urine (Voided) 225 300  525          Urine Occurrence  0 x  0 x        Emesis              Emesis Occurrence 1 x 0 x 0 x 1 x 0 x   0 x    Drains 110 230 180 520 100   100      Drain Output (Drain (Miscellaneous) blake drain Right Abdomen) 110 230 180 520 100   100    Stool              Stool Occurrence 0 x 0 x 0 x 0 x 0 x   0 x    Shift Total  (mL/kg) 335  (3.98) 530  (6.29) 180  (2.14) 1045  (12.41) 100  (1.19)   100  (1.19)   Weight (kg) 84.2 84.2 84.2 84.2 84.2 84.2 84.2 84.2       Nutrition:  MNT PROTOCOL FOR DIETITIAN  DIETARY ORAL SUPPLEMENTS Oral Supplements with tray: Ensure High Protein-Vanilla; BREAKFAST/LUNCH/DINNER; 2 Cans  DIET DIABETIC Calorie amount: CC 2000; Additional modifications/limitations: LOW FAT, LOW CHOLESTEROL;  Consistency/thickening: MECHANICAL SOFT (Ground meat/Soft foods)    Labs:  I have reviewed all lab values.   CBC   Recent Labs      10/28/17   0521  10/29/17   0507  10/30/17   0501   WBC  8.7  9.2  10.7   HGB  10.4*  10.1*  9.7*   HCT  29.9*  30.0*  29.0*   PLTCNT  140  142  156      Diff   Recent Labs      10/28/17   0521  10/29/17   0507  10/30/17   0501   PMNS  81  79  78   LYMPHOCYTES  7  6  5    MONOCYTES  9  10  11    EOSINOPHIL  3  5  6    BASOPHILS  1   0.06  1   0.06  0   0.04   PMNABS  7.03  7.34  8.36*   LYMPHSABS  0.56*  0.51*  0.52*   EOSABS  0.23  0.43  0.63*   MONOSABS  0.79  0.90  1.15*        BMP   Recent Labs      10/27/17   1239  10/28/17   0521  10/29/17   0507  10/30/17   0501   SODIUM  128*  126*  124*  124*   POTASSIUM  4.7  3.3*  3.7  3.6   CHLORIDE  96  93*  91*  89*   CO2  24  24  24  26    BUN  7*  7*  10  17   CREATININE  0.66  0.67  0.68  0.72   GLUCOSENF  221*  283*  235*  207*   ANIONGAP  8  9  9   9  BUNCRRATIO  11  10  15   24*   GFR  >59  >59  >59  >59        Assessment/ Plan:   Active Hospital Problems   (*Primary Problem)    Diagnosis    *Pancreatic cancer (CMS [place])       [name] is a 71 y.o. female 5 Days Post-Op robotic whipple for pancreatic cancer    POD: 5 Days Post-Op  Diet/fluids: diabetic regular / none  Activity: OOB  Pain control: roxicodone  DVT ppx: lovenox  Bowel reg/GI ppx: dulcolax + colace, suppository today / protonix  Last BM: 10/24/17  PT/OT: ordered  Lines: port, PIV, drain  Dispo: TBD    Plans: endocrine consult to help manage glucose (appreciate recs), OOB, gave suppository for BM, dispo planning      Cleatrice Burke, MD  10/30/2017, 09:03      I saw and examined the patient.  I reviewed the resident's note.  I agree with the findings and plan of care as documented in the resident's note.      Very fatigued today.  Working on better glucose control.  Tolerating diet with some nausea. Drain remains somewhat cloudy.    Lynwood Dawley, MD

## 2017-10-30 NOTE — Care Management Notes (Signed)
[facility] Management Note    Patient Name: [name]  Date of Birth: 01-Mar-1946  Sex: female  Date/Time of Admission: 10/25/2017  5:34 AM  Room/Bed: 975/A  Payor: MEDICARE / Plan: MEDICARE PART A AND B / Product Type: Medicare /    LOS: 5 days   PCP: No Pcp    Admitting Diagnosis:  Pancreatic cancer (CMS [place]) [C25.9]    Assessment:      10/30/17 1607   Assessment Details   Assessment Type Continued Assessment   Date of Care Management Update 10/30/17   Date of Next DCP Update 11/02/17   Care Management Plan   Discharge Planning Status plan in progress   Projected Discharge Date 11/01/17   CM will evaluate for rehabilitation potential yes   Patient choice offered to patient/family no   Discharge Needs Assessment   Discharge Facility/Level Of Care Needs Home vs Home with Home Health     Per service, pt is not medically ready for d/c.  Pt is dumping [place] in JP drain and had multiple episodes of emesis in the last couple days.      Discharge Plan:  Home vs home with Home Health      The patient will continue to be evaluated for developing discharge needs.     Case Manager: Joella Prince, [place]  Phone: [phone]

## 2017-10-30 NOTE — Care Plan (Signed)
Problem: Patient Care Overview (Adult,OB)  Goal: Plan of Care Review(Adult,OB)  The patient and/or their representative will communicate an understanding of their plan of care   Outcome: Ongoing (see interventions/notes)  Patient lethargic this morning.  Ambulated in room to BR and was able to tolerate sitting up in chair for about an hour.  Patient became nauseated and was unable to eat breakfast.  Hydrodiuril held d/t low BP and per MD.

## 2017-10-30 NOTE — Nurses Notes (Signed)
Surg onc resident paged: "[phone] [place] mag 1.3. Has port accessed. Requesting miralax. [phone]"

## 2017-10-30 NOTE — Care Plan (Signed)
[facility]  Occupational Therapy Initial Evaluation    Patient Name: [name]  Date of Birth: 04-Nov-1946  Height: Height: 154.9 cm (_0 )  Weight: Weight: 84.2 kg (185 lb 10 oz)  Room/Bed: 975/A  Payor: MEDICARE / Plan: MEDICARE PART A AND B / Product Type: Medicare /     Assessment:   Pt and nursing agreeable to OT consult with professional assist of PT.  Pt reports good family support and assist with needs PRN.  Pt would benefit from [place].       Discharge Needs:   Equipment Recommendation: none anticipated    Discharge Disposition: home with 24/7 assistance, home with home health    JUSTIFICATION OF DISCHARGE RECOMMENDATION   Based on current diagnosis, functional performance prior to admission, and current functional performance, this patient requires continued OT services in home with 24/7 assistance, home with home health  in order to achieve significant functional improvements.    Plan:   Current Intervention: endurance training, transfer training, bed mobility training, ADL retraining, balance training, strengthening    To provide Occupational therapy services 1x/day, minimum of 2x/week, until discharge, until goals are met.       The risks/benefits of therapy have been discussed with the patient/caregiver and he/she is in agreement with the established plan of care.       Subjective & Objective      10/30/17 1139   Therapist Pager   OT Assigned/ Pager # [phone] Chen [phone]   Rehab Session   Document Type evaluation   Total OT Minutes: 20   Patient Effort good   General Information   Patient Profile Reviewed? yes   Pertinent History of Current Functional Problem S/p ROBOTIC WHIPPLE    Medical Lines PIV Line;Peripheral Drain   Respiratory Status room air   Existing Precautions/Restrictions fall precautions;full code   Pre Treatment Status   Pre Treatment Patient Status Patient supine in bed;Telephone within reach;Call light within reach   Support Present Pre  Treatment  None   Communication Pre Treatment  Nurse   Communication Pre Treatment Comment Pt and nursing agreeable to OT consult with professional assist of PT.    Mutuality/Individual Preferences   What Information Would Help [place] Give You More Personalized Care? Up with FWW assist of 1   Living Environment   Lives With sibling(s)   Home Assessment: No Problems Identified   Home Accessibility ramps present at home   Functional Level Prior   Ambulation 1 - assistive equipment   Transferring 1 - assistive equipment   Toileting 0 - independent   Bathing 1 - assistive equipment   Dressing 0 - independent   Eating 0 - independent   Communication 0 - understands/communicates without difficulty   Prior Functional Level Comment Pt uses a walker and cane PRN    Pain Assessment   Pre/Post Treatment Pain Comment Pt with c/o abdominal pain but did not rate.    Coping/Psychosocial Response Interventions   Plan Of Care Reviewed With patient   Cognitive Assessment/Interventions   Behavior/Mood Observations behavior appropriate to situation, WNL/WFL   Orientation Status  oriented x 4   Attention WNL/WFL   Follows Commands  WNL   RUE Assessment   RUE Assessment WFL- Within Functional Limits   LUE Assessment   LUE Assessment WFL- Within Functional Limits   RLE Assessment   RLE Assessment WFL- Within Functional Limits   LLE Assessment   LLE Assessment WFL- Within Functional  Limits   Mobility Assessment/Training   Additional Documentation Gait Assessment/Treatment (Group)   Bed Mobility Assessment/Treatment   Bed Mobility, Assistive Device bed rails;Head of Bed Elevated   Supine-Sit Independence minimum assist (75% patient effort)   Impairments pain   Transfer Assessment/Treatment   Sit-Stand Independence  supervision required;verbal cues required   Sit-Stand-Sit, Assist Device front wheeled walker   Toilet Transfer Independence supervision required   Toilet Transfer Assist Device [facility])   Transfer Impairments pain   Transfer Comment Pt  required verbal cues for hand placement when standing from bed.    Gait Assessment/Treatment   Independence  supervision required   Assistive Device  rolling walker   Distance in Feet 250   Impairments  pain   Lower Body Dressing Assessment/Training   Comment Pt reports her sister would be able to assist with LB dressing PRN.    Toileting Assessment/Training   Position sitting;standing   Independence Level  supervision required   Comment hygiene and clothing management    Balance Skill Training   Sitting Balance: Static good balance   Sitting, Dynamic (Balance) good balance   Sit-to-Stand Balance fair + balance   Standing Balance: Static fair + balance   Standing Balance: Dynamic fair + balance   Post Treatment Status   Post Treatment Patient Status Patient sitting in bedside chair or w/c;Call light within reach;Telephone within reach   Support Present Post Treatment  Nurse present   Care Plan Goals   OT Rehab Goals Transfer Training Goal;Toileting Goal;LB Dressing Goal;Grooming Goal;Bed Mobility Goal   Bed Mobility Goal   Bed Mobility Goal, Date Established 10/30/17   Bed Mobility Goal, Time to Achieve by discharge   Bed Mobility Goal, Activity Type all bed mobility activities   Bed Mobility Goal, Independence Level modified independence   Grooming Goal   Grooming Goal, Date Established 10/30/17   Grooming Goal, Time to Achieve by discharge   Grooming Goal, Activity Type all grooming tasks   Grooming Goal, Independence  independent   LB Dressing Goal   LB Dressing Goal, Date Established 10/30/17   LB Dressing Goal, Time to Achieve by discharge   LB Dressing Goal, Activity Type all lower body dressing tasks   LB Dressing Goal, Independence Level modified independence   Toileting Goal   Toileting Goal, Date Established 10/30/17   Toileting Goal, Time to Achieve by discharge   Toileting Goal, Activity Type all toileting tasks   Toileting Goal, Independence Level independent   Transfer Training Goal   Transfer Training  Goal, Date Established 10/30/17   Transfer Training Goal, Time to Achieve by discharge   Transfer Training Goal, Activity Type all transfers   Transfer Training Goal, Independence Level independent   Planned Therapy Interventions, OT Eval   Planned Therapy Interventions endurance training;transfer training;bed mobility training;ADL retraining;balance training;strengthening   Occupational Therapy Clinical Impression   Functional Level at Time of Session Pt and nursing agreeable to OT consult with professional assist of PT.  Pt reports good family support and assist with needs PRN.  Pt would benefit from [place].    Criteria for Skilled Therapeutic Interventions Met (OT Eval) yes   Rehab Potential  good, to achieve stated therapy goals   Therapy Frequency 1x/day;minimum of 2x/week   Predicted Duration of Therapy until discharge;until goals are met   Anticipated Equipment Needs at Discharge none anticipated   Anticipated Discharge Disposition home with 24/7 assistance;home with home health   Evaluation Complexity Justification   Occupational Profile  Review Expanded review   Performance Deficits 3-5 deficits;Pain;Mobility;Endurance   Clinical Decision Making Moderate analytic complexity   Evaluation Complexity Moderate       Therapist:   Terrill Mohr, OT   Pager #: [phone]

## 2017-10-30 NOTE — Consults (Signed)
[facility]  Endocrinology/Diabetes Consult      Encounter Start Date:      10/25/2017  Inpatient Admission Date:  10/25/2017  Name:             [name]  Age:             71 y.o.  Attending:             Nicola Police, MD  PCP             No Pcp    Assessment  1. 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     Recommendations:   1.  Will recommend increasing lantus to 20 units daily, starting tonight.   2.  Please give additional lantus 10 units now.   3.  Will recommend increasing scheduled Humalog to 7 units TID  AC.  4.  Will recommend switching to conservative insulin sliding scale.   5.  Thank you for the consult, we will continue to follow. Please page with any questions.      Subjective:   Patient is stable Post OP but remains hyperglycemic after starting lantus and scheduled Humalog . Patient is well tolerating soft mechanical diet now.   Recent Labs      10/28/17   1629  10/28/17   1957  10/29/17   0513  10/29/17   1215  10/29/17   1623  10/29/17   2124  10/30/17   0600  10/30/17   1057   GLUIP  364*  291*  240*  225*  290*  124*  238*  200*     Examination:  Vital Signs:  Temp  Avg: 36.8 C (98.2 F)  Min: 36.3 C (97.3 F)  Max: 37.5 C (99.5 F)    Pulse  Avg: 79.5  Min: 77  Max: 83 BP  Min: 101/55  Max: 145/62   Resp  Avg: 12  Min: 6  Max: 25 SpO2  Avg: 94.8 %  Min: 94 %  Max: 95 %   Pain Score (Numeric, Faces): 10    General: acutely ill female  Eyes: conjunctiva moist pink.  Lungs: clear to A&P  Heart: regular rate no murmur, rub, or gallop  Abdomen: soft non tender.   Extremities : no edema or tenderness.          Fonnie Birkenhead, MD        Late entry for 10/30/17.  I reviewed the resident's note.  I agree with the findings and plan of care as documented in the resident's note.  Any exceptions/additions are edited/noted.    Alwyn Pea, MD

## 2017-10-31 LAB — CBC WITH DIFF
BASOPHIL #: 0.05 10*3/uL (ref 0.00–0.20)
BASOPHIL %: 1 %
EOSINOPHIL #: 0.5 10*3/uL (ref 0.00–0.50)
EOSINOPHIL %: 5 %
HCT: 26.7 % — ABNORMAL LOW (ref 33.5–45.2)
HGB: 9.2 g/dL — ABNORMAL LOW (ref 11.2–15.2)
LYMPHOCYTE #: 0.31 10*3/uL — ABNORMAL LOW (ref 1.00–4.80)
LYMPHOCYTE %: 3 %
MCH: 28.9 pg (ref 27.4–33.0)
MCHC: 34.3 g/dL (ref 32.5–35.8)
MCV: 84.3 fL (ref 78.0–100.0)
MONOCYTE #: 0.87 10*3/uL (ref 0.30–1.00)
MONOCYTE %: 10 %
MPV: 9.2 fL (ref 7.5–11.5)
NEUTROPHIL #: 7.43 10*3/uL (ref 1.50–7.70)
NEUTROPHIL #: 7.43 10*3/uL (ref 1.50–7.70)
NEUTROPHIL %: 81 %
PLATELETS: 164 10*3/uL (ref 140–450)
RBC: 3.17 x10ˆ6/uL — ABNORMAL LOW (ref 3.63–4.92)
RDW: 17.1 % — ABNORMAL HIGH (ref 12.0–15.0)
WBC: 9.2 10*3/uL (ref 3.5–11.0)

## 2017-10-31 LAB — POC BLOOD GLUCOSE (RESULTS)
GLUCOSE, POC: 184 mg/dL — ABNORMAL HIGH (ref 70–105)
GLUCOSE, POC: 112 mg/dL — ABNORMAL HIGH (ref 70–105)
GLUCOSE, POC: 191 mg/dL — ABNORMAL HIGH (ref 70–105)
GLUCOSE, POC: 237 mg/dL — ABNORMAL HIGH (ref 70–105)

## 2017-10-31 LAB — MAGNESIUM: MAGNESIUM: 1.6 mg/dL (ref 1.6–2.5)

## 2017-10-31 LAB — BASIC METABOLIC PANEL
ANION GAP: 6 mmol/L (ref 4–13)
BUN/CREA RATIO: 28 — ABNORMAL HIGH (ref 6–22)
BUN: 18 mg/dL (ref 8–25)
CALCIUM: 8.7 mg/dL (ref 8.5–10.2)
CALCIUM: 8.7 mg/dL (ref 8.5–10.2)
CHLORIDE: 89 mmol/L — ABNORMAL LOW (ref 96–111)
CO2 TOTAL: 28 mmol/L (ref 22–32)
CREATININE: 0.64 mg/dL (ref 0.49–1.10)
ESTIMATED GFR: >59 mL/min/1.73mˆ2 (ref >59)
GLUCOSE: 133 mg/dL (ref 65–139)
POTASSIUM: 3.3 mmol/L — ABNORMAL LOW (ref 3.5–5.1)
SODIUM: 123 mmol/L — ABNORMAL LOW (ref 136–145)

## 2017-10-31 LAB — HEPATIC FUNCTION PANEL
ALBUMIN: 2.1 g/dL — ABNORMAL LOW (ref 3.4–4.8)
ALKALINE PHOSPHATASE: 63 U/L (ref <150)
ALT (SGPT): 11 U/L (ref <55)
BILIRUBIN DIRECT: 0.4 mg/dL — ABNORMAL HIGH (ref <0.3)
BILIRUBIN TOTAL: 0.8 mg/dL (ref 0.3–1.3)
PROTEIN TOTAL: 4.7 g/dL — ABNORMAL LOW (ref 6.0–8.0)

## 2017-10-31 LAB — PHOSPHORUS: PHOSPHORUS: 4.2 mg/dL — ABNORMAL HIGH (ref 2.3–4.0)

## 2017-10-31 MED ORDER — POTASSIUM CHLORIDE ER 20 MEQ TABLET,EXTENDED RELEASE(PART/CRYST)
20.0000 meq | ORAL_TABLET | ORAL | Status: AC
Start: 2017-10-31 — End: 2017-10-31
  Administered 2017-10-31: 20 meq via ORAL
  Filled 2017-10-31: qty 1

## 2017-10-31 MED ORDER — MAGNESIUM SULFATE 2 GRAM/50 ML (4 %) IN WATER INTRAVENOUS PIGGYBACK
2.0000 g | INJECTION | Freq: Once | INTRAVENOUS | Status: AC
Start: 2017-10-31 — End: 2017-10-31
  Administered 2017-10-31: 0 g via INTRAVENOUS
  Administered 2017-10-31: 2 g via INTRAVENOUS
  Filled 2017-10-31: qty 50

## 2017-10-31 MED ORDER — BISACODYL 10 MG RECTAL SUPPOSITORY
10.00 mg | RECTAL | Status: AC
Start: 2017-10-31 — End: 2017-10-31
  Administered 2017-10-31: 10 mg via RECTAL
  Filled 2017-10-31: qty 1

## 2017-10-31 MED ORDER — ACETAMINOPHEN 325 MG TABLET
650.0000 mg | ORAL_TABLET | ORAL | Status: DC | PRN
Start: 2017-10-31 — End: 2017-11-16
  Administered 2017-10-31 – 2017-11-10 (×10): 650 mg via ORAL
  Filled 2017-10-31 (×10): qty 2

## 2017-10-31 MED ADMIN — acetaminophen 325 mg tablet: ORAL | @ 17:00:00

## 2017-10-31 MED ADMIN — hydroCHLOROthiazide 25 mg tablet: ORAL | @ 09:00:00

## 2017-10-31 MED ADMIN — sodium chloride 0.9 % (flush) injection syringe: ORAL | @ 09:00:00

## 2017-10-31 MED ADMIN — lactated Ringers intravenous solution: ORAL | @ 09:00:00

## 2017-10-31 MED ADMIN — lactated Ringers intravenous solution: @ 14:00:00

## 2017-10-31 MED ADMIN — dasatinib 100 mg tablet: RECTAL | @ 09:00:00

## 2017-10-31 MED ADMIN — montelukast 10 mg tablet: @ 03:00:00

## 2017-10-31 MED ADMIN — sodium chloride 0.9 % (flush) injection syringe: SUBCUTANEOUS | @ 07:00:00

## 2017-10-31 NOTE — Progress Notes (Signed)
[name], 71 y.o. female  Date of Admission:  10/25/2017  Date of Birth:  1946-11-14  Date of Service:  10/31/2017    Post Op Day: 6 Days Post-Op S/P Procedure(s) (LRB):  ROBOTIC WHIPPLE (N/A)  BLOCK PRE-OP (N/A)  XI ROBOT SUPPLY CARD (N/A)    Chief Complaint:Pancreatic cancer (CMS [place])    Subjective: NAEO. Denies F/C, CP, SOB, or vomiting. Endorses nausea. No BM yet.    Objective:  Filed Vitals:    10/30/17 1833 10/30/17 2355 10/31/17 0001 10/31/17 0457   BP:  138/84  (!) 136/55   Pulse:  79  80   Resp:       Temp: 36.8 C (98.2 F)  36.2 C (97.2 F) 36.4 C (97.5 F)   SpO2:  94%  92%     Pain Score (Numeric, Faces): 8    Home meds: lipitor, celexa, HCTZ, losartan, toprol xl    Current Medications:    Current Facility-Administered Medications:  atorvastatin (LIPITOR) tablet 10 mg Oral QPM   bisacodyl (DULCOLAX) rectal suppository 10 mg Rectal Once PRN   citalopram (CELEXA) tablet 20 mg Oral Daily   docusate sodium (COLACE) capsule 100 mg Oral 2x/day   enoxaparin PF (LOVENOX) 40 mg/0.4 mL SubQ injection 40 mg Subcutaneous Q24H   hydrALAZINE (APRESOLINE) injection 10 mg 10 mg Intravenous Q4H PRN   hydroCHLOROthiazide (HYDRODIURIL) tablet 25 mg Oral Daily   insulin glargine (LANTUS) 100 units/mL injection 20 Units Subcutaneous NIGHTLY   insulin lispro (HUMALOG) 100 units/mL injection 7 Units Subcutaneous 3x/day AC   LORazepam (ATIVAN) tablet 0.5 mg Oral Daily PRN   losartan (COZAAR) tablet 100 mg Oral Daily   magnesium sulfate 2 G in SW 50 mL premix IVPB 2 g Intravenous Once   metoprolol succinate (TOPROL-XL) 24 hr extended release tablet 50 mg Oral Daily   NS flush syringe 2 mL Intracatheter Q8HRS   And      NS flush syringe 2-6 mL Intracatheter Q1 MIN PRN   ondansetron (ZOFRAN) 2 mg/mL injection 4 mg Intravenous Q6H PRN   oxyCODONE (ROXICODONE) immediate release tablet 5 mg Oral Q4H PRN   Or      oxyCODONE  (ROXICODONE) immediate release tablet 10 mg Oral Q4H PRN   pantoprazole (PROTONIX) delayed release tablet 40 mg Oral Daily before Breakfast   polyethylene glycol (MIRALAX) oral packet 17 g Oral Daily   potassium chloride (K-DUR) extended release tablet 20 mEq Oral Now   potassium chloride 92meq per 108mL oral liquid 40 mEq Oral Daily with Breakfast   sennosides-docusate sodium (SENOKOT-S) 8.6-50mg  per tablet 1 Tab Oral 2x/day   SSIP insulin lispro (HUMALOG) 100 units/mL injection 3-9 Units Subcutaneous 4x/day PRN       Today's Physical Exam:  Constitutional: Appears in no acute distress  Eyes: Conjunctiva clear. Pupils equal and round. Sclera non-icteric.   ENT: ENMT without erythema or injection, mucous membranes moist.  Neck: Supple, symmetrical, trachea midline  Respiratory: breathing unlabored  Cardiovascular: Regular rate and rhythm  Gastrointestinal: Soft, incisions c/d/i, appropriately TTP  Neurologic: Grossly normal, Alert and oriented x3  Psychiatric: Normal    I/O:    Date 10/30/17 0700 - 10/31/17 3710 10/31/17 0700 - 11/01/17 6269  Shift [phone] 1500-2259 2300-0659 24 Hour Total 3825-0539 1500-2259 2300-0659 24 Hour Total   I  N  T  A  K  E   P.O. 100 490 340 930          Oral 100 490 340 930        I.V.  (mL/kg/hr)  50  (0.07)  50  (0.02)          IVPB Volume  50  50        Shift Total  (mL/kg) 100  (1.19) 540  (6.41) 340  (3.94) 980  (11.37)       O  U  T  P  U  T   Urine  (mL/kg/hr) 250  (0.37) 275  (0.41) 50  (0.07) 575  (0.28)          Urine (Voided) 250 275 50 575          Urine Occurrence  1 x 1 x 2 x        Emesis              Emesis Occurrence 0 x 0 x 0 x 0 x        Drains 100 170 300 570          Drain Output (Drain (Miscellaneous) JP Right Abdomen) 100 170 300 570        Stool              Stool Occurrence 0 x 0 x 0 x 0 x        Shift Total  (mL/kg) 350  (4.16) 445  (5.29) 350  (4.06) 1145  (13.28)       Weight (kg) 84.2 84.2 86.2 86.2 86.2 86.2 86.2 86.2       Nutrition:  MNT PROTOCOL FOR  DIETITIAN  DIETARY ORAL SUPPLEMENTS Oral Supplements with tray: Ensure High Protein-Vanilla; BREAKFAST/LUNCH/DINNER; 2 Cans  DIET DIABETIC Calorie amount: CC 2000; Additional modifications/limitations: LOW FAT, LOW CHOLESTEROL; Consistency/thickening: MECHANICAL SOFT (Ground meat/Soft foods)    Labs:  I have reviewed all lab values.   CBC   Recent Labs      10/29/17   0507  10/30/17   0501  10/31/17   0409   WBC  9.2  10.7  9.2   HGB  10.1*  9.7*  9.2*   HCT  30.0*  29.0*  26.7*   PLTCNT  142  156  164      Diff   Recent Labs      10/29/17   0507  10/30/17   0501  10/31/17   0409   PMNS  79  78  81   LYMPHOCYTES  6  5  3    MONOCYTES  10  11  10    EOSINOPHIL  5  6  5    BASOPHILS  1   0.06  0   0.04  1   0.05   PMNABS  7.34  8.36*  7.43   LYMPHSABS  0.51*  0.52*  0.31*   EOSABS  0.43  0.63*  0.50   MONOSABS  0.90  1.15*  0.87        BMP   Recent Labs      10/29/17   0507  10/30/17   0501  10/31/17   0409   SODIUM  124*  124*  123*   POTASSIUM  3.7  3.6  3.3*   CHLORIDE  91*  89*  89*  CO2  24  26  28    BUN  10  17  18    CREATININE  0.68  0.72  0.64   GLUCOSENF  235*  207*  133   ANIONGAP  9  9  6    BUNCRRATIO  15  24*  28*   GFR  >59  >59  >59        Assessment/ Plan:   Active Hospital Problems   (*Primary Problem)    Diagnosis    *Pancreatic cancer (CMS [facility])     [name] is a 71 y.o. female 6 Days Post-Op robotic whipple for pancreatic cancer    POD: 6 Days Post-Op  Diet/fluids: diabetic regular / none  Activity: OOB  Pain control: roxicodone  DVT ppx: lovenox  Bowel reg/GI ppx: dulcolax + colace, suppository ordered now / protonix  Last BM: 10/24/17  PT/OT: ordered  Lines: port, PIV, drain  Dispo: TBD    Plans: following endo recs for glucose management, OOB, pain control    Cyrus J. Bertram Denver, MD      I saw and examined the patient.  I reviewed the resident's note.  I agree with the findings and plan of care as documented in the resident's note.      Patient is doing well without apparent significant  complications, however is severely fatigued and has no appetite.  We will initiate calorie counts and consider PICC line and TPN  if p.o. intake is minimal.   Will check a thyroid function panel with tomorrow's labs.  I reviewed home medications.    Encouraged to ambulate, stay out of bed, and increase intake as tolerated.   The patient is not suitable for discharge until we see an improvement in her motivation for recovery.    Lynwood Dawley, MD

## 2017-10-31 NOTE — Care Plan (Signed)
[place]  Physical Therapy Progress Note      Patient Name: [name]  Date of Birth: Mar 04, 1946  Height:  154.9 cm (5\' 1" )  Weight:  86.2 kg (190 lb 0.6 oz)  Room/Bed: 975/A  Payor: MEDICARE / Plan: MEDICARE PART A AND B / Product Type: Medicare /     Assessment:     Patient with improved tolerance to activity, requires decreased assist for transfers. Will continue to follow for progressive strengthening and mobility training. Anticipate dch home with family supervision and HHPT.    Discharge Needs:   Equipment Recommendation: none anticipated      Discharge Disposition: home with home health    JUSTIFICATION OF DISCHARGE RECOMMENDATION   Based on current diagnosis, functional performance prior to admission, and current functional performance, this patient requires continued PT services in home with home health in order to achieve significant functional improvements in these deficit areas: aerobic capacity/endurance, gait, locomotion, and balance.      Plan:   Continue to follow patient according to established plan of care.  The risks/benefits of therapy have been discussed with the patient/caregiver and he/she is in agreement with the established plan of care.     Subjective & Objective:        10/31/17 1110   Therapist Pager   PT Assigned/ Pager # sally [phone]   Rehab Session   Document Type therapy note (daily note)   Total PT Minutes: 21   Patient Effort good   General Information   Patient Profile Reviewed? yes   Patient/Family Observations patient continues to have poor PO intake   Respiratory Status room air   Mutuality/Individual Preferences   How to Address Anxieties/Fears recommend ambulate TID with supervision   Pre Treatment Status   Pre Treatment Patient Status Patient sitting in bedside chair or w/c   Support Present Pre Treatment  Family present   Communication Pre Treatment  Nurse   Cognitive Assessment/Interventions   Behavior/Mood Observations behavior  appropriate to situation, WNL/WFL   Transfer Assessment/Treatment   Sit-Stand Independence  modified independence   Stand-Sit Independence  modified independence   Sit-Stand-Sit, Assist Device front wheeled walker   Toilet Transfer Independence modified independence   Toilet Transfer Assist Device grab bar   Transfer Comment Patient with slow transitions, places hands appropriately during transfers with good safety awareness.   Gait Assessment/Treatment   Independence  supervision required   Assistive Device  rolling walker   Distance in Feet 300   Total Distance Ambulated 300   Comment Patient gait trained 312ft with FWW supervision assist. No overt LOB, patient easiliy distracted, reports feeling weak. Encouraged continued routine ambulation and increased PO intake,   Balance Skill Training   Sitting Balance: Static good balance   Sitting, Dynamic (Balance) good balance   Sit-to-Stand Balance good balance   Standing Balance: Static fair + balance   Standing Balance: Dynamic fair + balance   Therapeutic Exercise/Activity   Comment Encouraged ambulation TID   Post Treatment Status   Post Treatment Patient Status Patient sitting in bedside chair or w/c   Support Present Post Treatment  Family present   Plan of Care Review   Plan Of Care Reviewed With patient   Physical Therapy Clinical Impression   Assessment Patient with improved tolerance to activity, requires decreased assist for transfers. Will continue to follow for progressive strengthening and mobility training. Anticipate dch home with family supervision and HHPT.   Anticipated Equipment Needs  at Discharge (PT Clinical Impression) none anticipated   Anticipated Discharge Disposition  home with home health       Therapist:   Stephani Police, PT   Pager #: [phone]

## 2017-10-31 NOTE — Care Plan (Signed)
Medical Nutrition Therapy  I:  Consult acknowledged for calorie count  Patient just has no appetite.  Vanilla ensure piling up in her room.  Really encouraged her to take po which will facilitate her being discharged.  Pt prefers to try chocolate ensure.    Assessment: 1600-1700 kcal (28-30 kcal/56.8 kg) and 57-67 g prot (1.2 g/47.7 kg)     P:  Calorie count to run 11/20 to 11/22.  Will order chocolate ensure HP.  Pt given liquid potassium which is making her nauseous and will prevent her from eating. Discussed with surgery team to see if it can be changed to pills.  Results to follow  Dierdre Searles, RD, LD, CNSC 10/31/2017, 10:37  Pager [phone]

## 2017-10-31 NOTE — Addendum Note (Signed)
Addendum  created 10/31/17 1420 by Romero Liner, MD    Anesthesia Intra Blocks edited, Sign clinical note

## 2017-10-31 NOTE — Care Plan (Signed)
Problem: Patient Care Overview (Adult,OB)  Goal: Plan of Care Review(Adult,OB)  The patient and/or their representative will communicate an understanding of their plan of care   Outcome: Ongoing (see interventions/notes)    Goal: Individualization/Patient Specific Goal(Adult/OB)  Outcome: Ongoing (see interventions/notes)    Goal: Interdisciplinary Rounds/Family Conf  Outcome: Ongoing (see interventions/notes)      Problem: Perioperative Period (Adult)  Prevent and manage potential problems including:1. bleeding2. gastrointestinal complications3. hypothermia4. infection5. pain6. perioperative injury7. respiratory compromise8. situational response9. urinary retention10. venous [id number]. wound complications   Goal: Signs and Symptoms of Listed Potential Problems Will be Absent or Manageable (Perioperative Period)  Signs and symptoms of listed potential problems will be absent or manageable by discharge/transition of care (reference Perioperative Period (Adult) CPG).   Outcome: Ongoing (see interventions/notes)      Problem: Nutrition, Imbalanced: Inadequate Oral Intake (Adult)  Goal: Improved Oral Intake  Patient will demonstrate the desired outcomes by discharge/transition of care.   Outcome: Ongoing (see interventions/notes)    Goal: Prevent Further Weight Loss  Patient will demonstrate the desired outcomes by discharge/transition of care.   Outcome: Ongoing (see interventions/notes)      Comments: POC reviewed thoroughly at bedside with patient and family.  Oral intake improving this shift.   Dietician to bedside to discuss calorie counts and nutritional supplements with meals. Bowel regimen continued and suppository administered-- patient passing flatus and bowel sounds active in all 4 quadrants.  Patient reports tenderness in abdomen-- PRN tylenol given per MAR.  Drain dressing monitored and changed as needed.  Up to chair x2 and up to toilet as requested.  Call light, personal belongings, and bedside  table within reach.

## 2017-11-01 LAB — CBC WITH DIFF
BASOPHIL #: 0.05 x10ˆ3/uL (ref 0.00–0.20)
BASOPHIL %: 1 %
BASOPHIL %: 1 %
EOSINOPHIL #: 0.48 x10ˆ3/uL (ref 0.00–0.50)
EOSINOPHIL %: 5 %
HCT: 28.3 % — ABNORMAL LOW (ref 33.5–45.2)
HGB: 9.8 g/dL — ABNORMAL LOW (ref 11.2–15.2)
LYMPHOCYTE #: 0.39 10*3/uL — ABNORMAL LOW (ref 1.00–4.80)
LYMPHOCYTE %: 4 %
MCH: 29.1 pg (ref 27.4–33.0)
MCHC: 34.7 g/dL (ref 32.5–35.8)
MCV: 83.6 fL (ref 78.0–100.0)
MONOCYTE #: 0.89 x10ˆ3/uL (ref 0.30–1.00)
MONOCYTE %: 9 %
MONOCYTE %: 9 %
MPV: 9.6 fL (ref 7.5–11.5)
NEUTROPHIL #: 7.94 x10ˆ3/uL — ABNORMAL HIGH (ref 1.50–7.70)
NEUTROPHIL %: 81 %
PLATELETS: 210 10*3/uL (ref 140–450)
RBC: 3.38 x10ˆ6/uL — ABNORMAL LOW (ref 3.63–4.92)
RDW: 17.2 % — ABNORMAL HIGH (ref 12.0–15.0)
WBC: 9.7 x10ˆ3/uL (ref 3.5–11.0)

## 2017-11-01 LAB — PHOSPHORUS: PHOSPHORUS: 3.1 mg/dL (ref 2.3–4.0)

## 2017-11-01 LAB — POC BLOOD GLUCOSE (RESULTS)
GLUCOSE, POC: 196 mg/dL — ABNORMAL HIGH (ref 70–105)
GLUCOSE, POC: 64 mg/dL — ABNORMAL LOW (ref 70–105)
GLUCOSE, POC: 64 mg/dL — ABNORMAL LOW (ref 70–105)
GLUCOSE, POC: 73 mg/dL (ref 70–105)
GLUCOSE, POC: 73 mg/dL (ref 70–105)
GLUCOSE, POC: 94 mg/dL (ref 70–105)
GLUCOSE, POC: 376 mg/dL — ABNORMAL HIGH (ref 70–105)
GLUCOSE, POC: 376 mg/dL — ABNORMAL HIGH (ref 70–105)

## 2017-11-01 LAB — THYROID STIMULATING HORMONE WITH FREE T4 REFLEX: TSH: 1.046 u[IU]/mL (ref 0.350–5.000)

## 2017-11-01 LAB — BASIC METABOLIC PANEL
ANION GAP: 8 mmol/L (ref 4–13)
BUN/CREA RATIO: 21 (ref 6–22)
BUN/CREA RATIO: 21 (ref 6–22)
BUN: 14 mg/dL (ref 8–25)
CALCIUM: 8.8 mg/dL (ref 8.5–10.2)
CHLORIDE: 88 mmol/L — ABNORMAL LOW (ref 96–111)
CO2 TOTAL: 26 mmol/L (ref 22–32)
CREATININE: 0.67 mg/dL (ref 0.49–1.10)
CREATININE: 0.67 mg/dL (ref 0.49–1.10)
ESTIMATED GFR: >59 mL/min/1.73mˆ2 (ref >59)
GLUCOSE: 161 mg/dL — ABNORMAL HIGH (ref 65–139)
POTASSIUM: 3.3 mmol/L — ABNORMAL LOW (ref 3.5–5.1)
SODIUM: 122 mmol/L — ABNORMAL LOW (ref 136–145)

## 2017-11-01 LAB — HEPATIC FUNCTION PANEL
ALBUMIN: 2.3 g/dL — ABNORMAL LOW (ref 3.4–4.8)
ALKALINE PHOSPHATASE: 91 U/L (ref <150)
AST (SGOT): 16 U/L (ref 8–41)
BILIRUBIN DIRECT: 0.4 mg/dL — ABNORMAL HIGH (ref <0.3)
BILIRUBIN TOTAL: 0.7 mg/dL (ref 0.3–1.3)
PROTEIN TOTAL: 5.1 g/dL — ABNORMAL LOW (ref 6.0–8.0)

## 2017-11-01 MED ORDER — LIDOCAINE (PF) 10 MG/ML (1 %) INJECTION SOLUTION
0.5000 mL | Freq: Once | INTRAMUSCULAR | Status: AC
Start: 2017-11-01 — End: 2017-11-01
  Administered 2017-11-01: 0.5 mL via INTRADERMAL

## 2017-11-01 MED ORDER — SODIUM CHLORIDE 0.9 % (FLUSH) INJECTION SYRINGE
10.0000 mL | INJECTION | Freq: Three times a day (TID) | INTRAMUSCULAR | Status: DC
Start: 2017-11-01 — End: 2017-11-16
  Administered 2017-11-01: 0 mL
  Administered 2017-11-01 – 2017-11-02 (×3): 10 mL
  Administered 2017-11-02: 0 mL
  Administered 2017-11-03 (×3): 10 mL
  Administered 2017-11-04: 30 mL
  Administered 2017-11-04: 10 mL
  Administered 2017-11-04: 20 mL
  Administered 2017-11-05 – 2017-11-06 (×5): 10 mL
  Administered 2017-11-06: 0 mL
  Administered 2017-11-07: 20 mL
  Administered 2017-11-07: 10 mL
  Administered 2017-11-07: 0 mL
  Administered 2017-11-08: 20 mL
  Administered 2017-11-08: 10 mL
  Administered 2017-11-08: 20 mL
  Administered 2017-11-09: 0 mL
  Administered 2017-11-09: 10 mL
  Administered 2017-11-09: 20 mL
  Administered 2017-11-10: 10 mL
  Administered 2017-11-10: 30 mL
  Administered 2017-11-10: 0 mL
  Administered 2017-11-11 – 2017-11-12 (×4): 10 mL
  Administered 2017-11-12: 0 mL
  Administered 2017-11-13 (×2): 10 mL
  Administered 2017-11-14: 0 mL
  Administered 2017-11-14: 10 mL
  Administered 2017-11-15: 0 mL
  Administered 2017-11-15 – 2017-11-16 (×3): 10 mL

## 2017-11-01 MED ORDER — MAGNESIUM OXIDE 400 MG (241.3 MG MAGNESIUM) TABLET
400.0000 mg | ORAL_TABLET | Freq: Two times a day (BID) | ORAL | Status: DC
Start: 2017-11-01 — End: 2017-11-01

## 2017-11-01 MED ORDER — SODIUM PHOSPHATE 3 MMOL/ML INTRAVENOUS SOLUTION
20.0000 mmol | Freq: Once | INTRAVENOUS | Status: AC
Start: 2017-11-01 — End: 2017-11-01
  Administered 2017-11-01: 0 mmol via INTRAVENOUS
  Administered 2017-11-01: 20 mmol via INTRAVENOUS
  Filled 2017-11-01: qty 6.67

## 2017-11-01 MED ORDER — SODIUM CHLORIDE 0.9 % (FLUSH) INJECTION SYRINGE
20.0000 mL | INJECTION | INTRAMUSCULAR | Status: DC | PRN
Start: 2017-11-01 — End: 2017-11-16
  Administered 2017-11-01: 20 mL
  Filled 2017-11-01: qty 30

## 2017-11-01 MED ORDER — MAGNESIUM SULFATE 2 GRAM/50 ML (4 %) IN WATER INTRAVENOUS PIGGYBACK
2.0000 g | INJECTION | Freq: Once | INTRAVENOUS | Status: AC
Start: 2017-11-01 — End: 2017-11-01
  Administered 2017-11-01: 2 g via INTRAVENOUS
  Administered 2017-11-01: 0 g via INTRAVENOUS
  Filled 2017-11-01: qty 50

## 2017-11-01 MED ORDER — TRACE ELEMENTS CR-CU-MN-SE-ZN 10 MCG-1 MG-0.5 MG-60 MCG-5MG/ML IV SOLN
INTRAVENOUS | Status: AC
Start: 2017-11-01 — End: 2017-11-02
  Filled 2017-11-01: qty 500

## 2017-11-01 MED ADMIN — sodium chloride 0.9 % (flush) injection syringe: @ 22:00:00

## 2017-11-01 MED ADMIN — nystatin 100,000 unit/gram topical ointment: SUBCUTANEOUS | @ 06:00:00 | NDC 45802004835

## 2017-11-01 MED ADMIN — nystatin 100,000 unit/gram topical cream: SUBCUTANEOUS | @ 21:00:00 | NDC 00168005415

## 2017-11-01 NOTE — Progress Notes (Signed)
[facility]  Surgical Oncology Progress Note    [name],[name], 71 y.o. female  Date of Birth:  06/30/1946  Date of Admission:  10/25/2017  Date of service: 11/01/2017    Subjective: No acute events overnight.  Had one bowel movement overnight.  Still passing gas.    Physical Exam:  BP (!) 125/54  Pulse 68  Temp 35.8 C (96.4 F)  Resp 16  Ht 1.549 m (5\' 1" )  Wt 83.1 kg (183 lb 3.2 oz)  SpO2 94%  BMI 34.62 kg/m2    General: Appears in no acute distress, appears as stated age  Eyes: Conjunctiva clear., Pupils equal and round.   HENT:ENMT without erythema or injection, mucous membranes moist.  Lungs: Even and nonlabored respiration  Cardiovascular: regular rate and rhythm  Abdomen: Soft, non-tender, non-distended  Extremities: Grossly moving all extremities  Skin: Skin warm and dry    I/O:    Date 10/31/17 0700 - 11/01/17 0659 11/01/17 0700 - 11/02/17 0659   Shift 4403-4742 1500-2259 2300-0659 24 Hour Total 0700-1459 1500-2259 2300-0659 24 Hour Total   I  N  T  A  K  E   P.O. 480 240 100 820 240   240      Oral 480 240 100 820 240   240    Shift Total  (mL/kg) 480  (5.57) 240  (2.78) 100  (1.2) 820  (9.87) 240  (2.89)   240  (2.89)   O  U  T  P  U  T   Urine  (mL/kg/hr) 450  (0.65)   450  (0.23)          Urine (Voided) 450   450          Urine Occurrence 1 x 2 x 2 x 5 x 1 x   1 x    Emesis              Emesis Occurrence 0 x   0 x        Drains 170 190 95 455 0   0      Drain Output (Drain (Miscellaneous) JP Right Abdomen) 170 190 95 455 0   0    Stool              Stool Occurrence 0 x 0 x 2 x 2 x        Shift Total  (mL/kg) 620  (7.19) 190  (2.2) 95  (1.14) 905  (10.89) 0  (0)   0  (0)   Weight (kg) 86.2 86.2 83.1 83.1 83.1 83.1 83.1 83.1       Nutrition/Residuals:  MNT PROTOCOL FOR DIETITIAN  DIET CLEAR LIQUID  DIETARY ORAL SUPPLEMENTS Oral Supplements with tray: Ensure Clear-Apple; BREAKFAST/LUNCH/DINNER; 1 Can  adult custom parenteral nutrition    Labs  Reviewed:   Lab Results Today:     Results for orders placed or performed during the hospital encounter of 10/25/17 (from the past 24 hour(s))   POC BLOOD GLUCOSE (RESULTS)   Result Value Ref Range    GLUCOSE, POC 112 (H) 70 - 105 mg/dL   POC BLOOD GLUCOSE (RESULTS)   Result Value Ref Range    GLUCOSE, POC 191 (H) 70 - 105 mg/dL   POC BLOOD GLUCOSE (RESULTS)   Result Value Ref Range    GLUCOSE, POC 237 (H) 70 - 105 mg/dL   THYROID STIMULATING HORMONE WITH FREE T4 REFLEX   Result Value Ref Range  TSH 1.046 0.350 - 5.000 uIU/mL   BASIC METABOLIC PANEL   Result Value Ref Range    SODIUM 122 (L) 136 - 145 mmol/L    POTASSIUM 3.3 (L) 3.5 - 5.1 mmol/L    CHLORIDE 88 (L) 96 - 111 mmol/L    CO2 TOTAL 26 22 - 32 mmol/L    ANION GAP 8 4 - 13 mmol/L    CALCIUM 8.8 8.5 - 10.2 mg/dL    GLUCOSE 161 (H) 65 - 139 mg/dL    BUN 14 8 - 25 mg/dL    CREATININE 0.67 0.49 - 1.10 mg/dL    BUN/CREA RATIO 21 6 - 22    ESTIMATED GFR >59 >59 mL/min/1.44m^2   PHOSPHORUS   Result Value Ref Range    PHOSPHORUS 3.1 2.3 - 4.0 mg/dL   MAGNESIUM   Result Value Ref Range    MAGNESIUM 1.5 (L) 1.6 - 2.5 mg/dL   HEPATIC FUNCTION PANEL   Result Value Ref Range    ALBUMIN 2.3 (L) 3.4 - 4.8 g/dL    ALKALINE PHOSPHATASE 91 <150 U/L    ALT (SGPT) 10 <55 U/L    AST (SGOT) 16 8 - 41 U/L    BILIRUBIN TOTAL 0.7 0.3 - 1.3 mg/dL    BILIRUBIN DIRECT 0.4 (H) <0.3 mg/dL    PROTEIN TOTAL 5.1 (L) 6.0 - 8.0 g/dL   CBC WITH DIFF   Result Value Ref Range    WBC 9.7 3.5 - 11.0 x10^3/uL    RBC 3.38 (L) 3.63 - 4.92 x10^6/uL    HGB 9.8 (L) 11.2 - 15.2 g/dL    HCT 28.3 (L) 33.5 - 45.2 %    MCV 83.6 78.0 - 100.0 fL    MCH 29.1 27.4 - 33.0 pg    MCHC 34.7 32.5 - 35.8 g/dL    RDW 17.2 (H) 12.0 - 15.0 %    PLATELETS 210 140 - 450 x10^3/uL    MPV 9.6 7.5 - 11.5 fL    NEUTROPHIL % 81 %    LYMPHOCYTE % 4 %    MONOCYTE % 9 %    EOSINOPHIL % 5 %    BASOPHIL % 1 %    NEUTROPHIL # 7.94 (H) 1.50 - 7.70 x10^3/uL    LYMPHOCYTE # 0.39 (L) 1.00 - 4.80 x10^3/uL    MONOCYTE # 0.89 0.30 - 1.00 x10^3/uL    EOSINOPHIL # 0.48 0.00  - 0.50 x10^3/uL    BASOPHIL # 0.05 0.00 - 0.20 x10^3/uL   POC BLOOD GLUCOSE (RESULTS)   Result Value Ref Range    GLUCOSE, POC 196 (H) 70 - 105 mg/dL     Radiology  No new imaging is available for review    Assessment/ Plan:      Active Hospital Problems    Diagnosis   . Primary Problem: Pancreatic cancer (CMS [facility])       [name] is a 71 y.o. female POD 7 from robotic whipple for pancreatic cancer    - PO intake continue to be minimal.  Will continue with calorie count 11/20 to 11/22.  Will place PICC line and start TPN today  - Continue with clear liquid diet  - Appreciate endo's recs, glucose level is more controlled, will continue to monitor  - OOB and ambulate  - PT/OT  - DVT ppx: lovenox and SCD  - Dispo: home with home health infusion    Maryelizabeth Kaufmann, MD  11/01/2017, 11:59       I saw  and examined the patient.  I reviewed the resident's note.  I agree with the findings and plan of care as documented in the resident's note.      Decreased energy and poor motivation to recover.  Given her limited p.o. intake and the chyle leak from her drain, we will put her on clears and initiate TPN.    She is otherwise doing well with no apparent complications.  I reviewed her pathology report with her.    Lynwood Dawley, MD

## 2017-11-01 NOTE — Care Management Notes (Signed)
[place] Management Note    Patient Name: [name]  Date of Birth: May 31, 1946  Sex: female  Date/Time of Admission: 10/25/2017  5:34 AM  Room/Bed: 975/A  Payor: MEDICARE / Plan: MEDICARE PART A AND B / Product Type: Medicare /    LOS: 7 days   PCP: No Pcp    Admitting Diagnosis:  Pancreatic cancer (CMS [place]) [C25.9]    Assessment:      11/01/17 1146   Assessment Details   Assessment Type Continued Assessment   Date of Care Management Update 11/01/17   Date of Next DCP Update 11/04/17   Care Management Plan   Discharge Planning Status plan in progress   Projected Discharge Date 11/08/17   CM will evaluate for rehabilitation potential yes   Patient choice offered to patient/family yes   Form for patient choice reviewed/signed and on chart no   Patient aware of possible cost for ambulance transport?  Yes   Discharge Needs Assessment   Discharge Facility/Level Of Care Needs Home with Infusion Only (code 1);Home with Home Health (code 6)   Transportation Available family or friend will provide       Discharge Plan:  [place] with Infusion  Per service patient planned PICC placement today and will start TPN infusions.  Plan for patient to discharge home with home health and TPN to her sisters home.  Patient has a Large [place] leak and will require TPN at home. CCC to meet with patient for choice for [facility] and [place].  [place] tasked CM assistant to check in network benefits.  CCC to follow.     The patient will continue to be evaluated for developing discharge needs.     Case Manager: Frederik Pear, RN  Phone: [phone]

## 2017-11-01 NOTE — Discharge Instructions (Addendum)
PICC DISCHARGE INSTRUCTIONS      PICC Information:  5 FR Power PICC, Bard SOLO  with 2 lumen(s).  Placed in Right, Brachial Vein with tip location of Lower 1/3 of SVC.   Total Catheter length is 41cm.  External Catheter length is 2cm.  Placed on 11/01/17  Placed by Donn Pierini, RN [facility]  [facility] Vascular Access Team:   Office Phone with Voicemail [phone] ext: [phone]  Pager [phone], pager [phone]     Flush Instruction:  use only a 10ML syringe; flush with a push; pause motion and 10ML saline daily    Blood Sampling Instructions  Flush 40ml saline, waste 2-5 ml, obtain specimen, flush 74ml saline and For multi-lumen line, draw specimen from distal port after stopping infusion for 2 minutes    Dressing Care Instructions:  Stat Lock securement device should be changed weekly with dressing change and as needed.  Change PICC cap/valve weekly and as needed, Use sterile technique, cover PICC and the device securement with transparent dressing at least weekly and as needed. Change first PICC dressing on DATE: 11/08/17      Peripherally Inserted Central Catheter (PICC)   Home Guide     A peripherally inserted central catheter (PICC) is a long, thin, flexible tube that is inserted into a vein in the upper arm. It is a form of intravenous (IV) access. It is considered to be a "central" line because the tip of the PICC ends in a large vein in your chest. This large vein is called the superior vena cava (SVC). The PICC tip ends in the SVC because there is a lot of blood flow in the SVC. This allows medicines and IV fluids to be quickly distributed throughout the body. The PICC is inserted using a sterile technique by a specially trained nurse or physician. After the PICC is inserted, a chest X-ray is done or a vascular positioning system is used to be sure it is in the correct place.   A PICC may be placed for different reasons, such as:   To give medicines and liquid nutrition that can only be given through a central line.  Examples are:   Certain antibiotic treatments.   Chemotherapy.   Total parenteral nutrition (TPN).   To take frequent blood samples.   To give IV fluids and blood products.   If there is difficulty placing a peripheral intravenous (PIV) catheter.  If taken care of properly, a PICC can remain in place for several months. A PICC can also allow patients to go home early. Medicine and PICC care can be managed at home by a family member or home healthcare team.   RISKS AND COMPLICATIONS   Possible problems with a PICC can occasionally occur. This may include:   A clot (thrombus) forming in or at the tip of the PICC. This can cause the PICC to become clogged. A "clot-busting" medicine called tissue plasminogen activator (tPA) can be inserted into the PICC to help break up the clot.   Inflammation of the vein (phlebitis) in which the PICC is placed. Signs of inflammation may include redness, pain at the insertion site, red streaks, or being able to feel a "cord" in the vein where the PICC is located.   Infection in the PICC or at the insertion site. Signs of infection may include fever, chills, redness, swelling, or pus drainage from the PICC insertion site.   PICC movement (malposition). The PICC tip may migrate from its original position due to  excessive physical activity, forceful coughing, sneezing, or vomiting.   A break or cut in the PICC. It is important to not use scissors near the PICC.   Nerve or tendon irritation or injury during PICC insertion.  HOME CARE INSTRUCTIONS   Activity   You may bend your arm and move it freely. If your PICC is near or at the bend of your elbow, avoid activity with repeated motion at the elbow.   Avoid lifting heavy objects as instructed by your caregiver.   Avoid using a crutch with the arm on the same side as your PICC. You may need to use a walker.  PICC Dressing   Keep your PICC bandage (dressing) clean and dry to prevent infection.   Ask your caregiver when you may shower. Ask  your caregiver to teach you how to wrap the PICC when you do take a shower.   Do not bathe, swim, or use hot tubs when you have a PICC.   Change the PICC dressing as instructed by your caregiver.   Change your PICC dressing if it becomes loose or wet.  General PICC Care   Check the PICC insertion site daily for leakage, redness, swelling, or pain.   Flush the PICC as directed by your caregiver. Let your caregiver know right away if the PICC is difficult to flush or does not flush. Do not use force to flush the PICC.   Do not use a syringe that is less than 10 mLs to flush the PICC.   Never pull or tug on the PICC.   Avoid blood pressure checks on the arm with the PICC.   Keep your PICC identification card with you at all times.   Do not take the PICC out yourself. Only a trained clinical professional should remove the PICC.  SEEK IMMEDIATE MEDICAL CARE IF:   Your PICC is accidently pulled all the way out. If this happens, cover the insertion site with a bandage or gauze dressing. Do not throw the PICC away. Your caregiver will need to inspect it.   Your PICC was tugged or pulled and has partially come out. Do not push the PICC back in.   There is any type of drainage, redness, or swelling where the PICC enters the skin.   You cannot flush the PICC, it is difficult to flush, or the PICC leaks around the insertion site when it is flushed.   You hear a "flushing" sound when the PICC is flushed.   You have pain, discomfort, or numbness in your arm, shoulder, or jaw on the same side as the PICC .   You feel your heart "racing" or skipping beats.   You notice a hole or tear in the PICC.   You develop chills or a fever.  MAKE SURE YOU:   Understand these instructions.   Will watch your condition.   Will get help right away if you are not doing well or get worse.5    This information is not intended to replace advice given to you by your health care provider. Make sure you discuss any questions you have with your health  care provider.     Nursing please call report to [facility] [phone]

## 2017-11-01 NOTE — Care Plan (Signed)
Problem: Patient Care Overview (Adult,OB)  Goal: Plan of Care Review(Adult,OB)  The patient and/or their representative will communicate an understanding of their plan of care   Outcome: Ongoing (see interventions/notes)    Goal: Individualization/Patient Specific Goal(Adult/OB)  Outcome: Ongoing (see interventions/notes)    Goal: Interdisciplinary Rounds/Family Conf  Outcome: Ongoing (see interventions/notes)      Problem: Perioperative Period (Adult)  Prevent and manage potential problems including:1. bleeding2. gastrointestinal complications3. hypothermia4. infection5. pain6. perioperative injury7. respiratory compromise8. situational response9. urinary retention10. venous [id number]. wound complications   Goal: Signs and Symptoms of Listed Potential Problems Will be Absent or Manageable (Perioperative Period)  Signs and symptoms of listed potential problems will be absent or manageable by discharge/transition of care (reference Perioperative Period (Adult) CPG).   Outcome: Ongoing (see interventions/notes)      Problem: Nutrition, Imbalanced: Inadequate Oral Intake (Adult)  Goal: Improved Oral Intake  Patient will demonstrate the desired outcomes by discharge/transition of care.   Outcome: Ongoing (see interventions/notes)    Goal: Prevent Further Weight Loss  Patient will demonstrate the desired outcomes by discharge/transition of care.   Outcome: Ongoing (see interventions/notes)      Problem: Nutrition, Parenteral (Adult)  Prevent and manage potential problems including:  1. fluid/electrolyte imbalance  2. glycemic control impaired  3. infection  4. infusion-related complications  5. malnutrition   Goal: Signs and Symptoms of Listed Potential Problems Will be Absent, Minimized or Managed (Nutrition, Parenteral)  Signs and symptoms of listed potential problems will be absent, minimized or managed by discharge/transition of care (reference Nutrition, Parenteral (Adult) CPG).   Outcome: Ongoing (see  interventions/notes)      Comments: POC reviewed thoroughly throughout shift with patient.  PICC placed in R. arm for TPN to be started this evening at 2000.  Clear liquid diet--patient taking very minimal amounts of fluids PO.  Blood glucose monitored.  Complete bath given.  Dressing around drain monitored and changed x2 throughout shift.

## 2017-11-01 NOTE — Nurses Notes (Signed)
Patient had no acute changes overnight. Patient did have increased intake overnight. MD ordered PICC line insertion for TPN therapy this morning . Patient drain dressing changed two times overnight due to leak around drainage site. Last time of dressing change at [phone]. Phill Myron, RN  11/01/2017, 07:23

## 2017-11-01 NOTE — Care Plan (Addendum)
Problem: Patient Care Overview (Adult,OB)  Goal: Plan of Care Review(Adult,OB)  The patient and/or their representative will communicate an understanding of their plan of care   Outcome: Ongoing (see interventions/notes)      Problem: Nutrition, Parenteral (Adult)  Prevent and manage potential problems including:  1. fluid/electrolyte imbalance  2. glycemic control impaired  3. infection  4. infusion-related complications  5. malnutrition  Goal: Signs and Symptoms of Listed Potential Problems Will be Absent, Minimized or Managed (Nutrition, Parenteral)  Signs and symptoms of listed potential problems will be absent, minimized or managed by discharge/transition of care (reference Nutrition, Parenteral (Adult) CPG).  Outcome: Ongoing (see interventions/notes)      Medical Nutrition Therapy Assessment        SUBJECTIVE : Physician consult for TPN recs. TPN indicated due to pt developing a chyle leak and poor po intake. Calorie count  was started 11/20, but is now d/c due to diet restricted to clears. From day 1 of  Calorie count, pt consumed 44-47% of calorie needs and 70-83% of protein needs. Pt now on clear liquid diet with apple Ensure clear supplements TID. See below for TPN recs.    OBJECTIVE:     Current Diet Order/Nutrition Support:    MNT PROTOCOL FOR DIETITIAN  DIET CLEAR LIQUID  DIETARY ORAL SUPPLEMENTS Oral Supplements with tray: Ensure Clear-Apple; BREAKFAST/LUNCH/DINNER; 1 Can     Height Used for Calculations: 154.9 cm (5\' 1" )  Weight Used For Calculations: 85.9 kg (189 lb 6 oz)  BMI (kg/m2): 35.86    IBW: 47.7 kg   %IBW: 180 %   Adj BW: 57.2 kg           Estimated Needs:    Energy Calorie Requirements: 1600-1700 per day (28-30 kcals/57.2 kg Adj. BW)  Protein Requirements (gms/day): 57-67 per day (1.2-1.4g/47.7 kg IBW)  Fluid Requirements: 1600-1700 per day (28-30 mLs/57.2 kg Adj. BW)    Comments: 71 y.o. female 6 Days Post-Op robotic whipple for pancreatic cancer, severely fatigued with poor appetite, has  since developed chyle leak    Recommend :   - Continue clear liquid diet as appropriate.   - Continue apple Ensure clears TID.  - Monitor and encourage po intake.    Initiate  TPN below goal strength (1100 kcals, 50 g pro, 570 kcals dextrose, 330 kcals IL at ~40 ml/hr) to avoid refeeding.  - 100 mg thiamine x 5 days will be added to TPN.  Monitor potassium, magnesium and phosphorus daily along with blood sugars and BMP.    - Advance TPN to goal as able by 200-300 kcals at a time, recommended next advancement:   (1300 kcals, 55 g pro, 690 kcals dextrose, 390 kcals IL at ~45 ml/hr)    TPN Goal Rate: 72 ml/hr  1700 cal /day = 30 cal/kg  75 g protein = 1.4 g/ kg  890 dextrose cals GIR: 3.2 mg/kg/min  510 IL cals or 1.0 g fat/kg  Monitor LFTs and trigs weekly.  Weigh weekly, standing preferred.    If home TPN regimen needed, recommend cycling over 12 hours for mobility.   - 79 mls for the 1st and last hour  - 157 mls over remaining 10 hours      Will continue to monitor.    Nutrition Diagnosis: Altered GI function related to chyle leak and poor appetite as evidenced by need for supplemental TPN    Lazarus Salines  11/01/2017, 11:43    I reviewed the intern's note and agree  with the findings and recommendations documented. Any exceptions/additions are edited/noted.    Dierdre Searles, RD, LD, CNSC 11/01/2017, 12:47  Pager [phone]

## 2017-11-02 LAB — POC BLOOD GLUCOSE (RESULTS)
GLUCOSE, POC: 240 mg/dL — ABNORMAL HIGH (ref 70–105)
GLUCOSE, POC: 258 mg/dL — ABNORMAL HIGH (ref 70–105)
GLUCOSE, POC: 151 mg/dL — ABNORMAL HIGH (ref 70–105)
GLUCOSE, POC: 189 mg/dL — ABNORMAL HIGH (ref 70–105)
GLUCOSE, POC: 149 mg/dL — ABNORMAL HIGH (ref 70–105)

## 2017-11-02 LAB — HEPATIC FUNCTION PANEL
ALBUMIN: 2 g/dL — ABNORMAL LOW (ref 3.4–4.8)
ALKALINE PHOSPHATASE: 78 U/L (ref <150)
ALKALINE PHOSPHATASE: 78 U/L (ref <150)
ALT (SGPT): 8 U/L (ref <55)
AST (SGOT): 14 U/L (ref 8–41)
AST (SGOT): 14 U/L (ref 8–41)
BILIRUBIN DIRECT: 0.2 mg/dL (ref <0.3)
PROTEIN TOTAL: 4.7 g/dL — ABNORMAL LOW (ref 6.0–8.0)

## 2017-11-02 LAB — CBC WITH DIFF
BASOPHIL #: 0.06 10*3/uL (ref 0.00–0.20)
BASOPHIL %: 1 %
BASOPHIL %: 1 %
EOSINOPHIL #: 0.5 x10ˆ3/uL (ref 0.00–0.50)
EOSINOPHIL %: 5 %
EOSINOPHIL %: 5 %
HCT: 26.5 % — ABNORMAL LOW (ref 33.5–45.2)
HGB: 9.1 g/dL — ABNORMAL LOW (ref 11.2–15.2)
HGB: 9.1 g/dL — ABNORMAL LOW (ref 11.2–15.2)
LYMPHOCYTE #: 0.36 x10ˆ3/uL — ABNORMAL LOW (ref 1.00–4.80)
LYMPHOCYTE %: 4 %
MCH: 28.7 pg (ref 27.4–33.0)
MCHC: 34.4 g/dL (ref 32.5–35.8)
MCV: 83.5 fL (ref 78.0–100.0)
MONOCYTE #: 0.9 x10ˆ3/uL (ref 0.30–1.00)
MONOCYTE %: 9 %
MPV: 9 fL (ref 7.5–11.5)
NEUTROPHIL #: 7.82 10*3/uL — ABNORMAL HIGH (ref 1.50–7.70)
NEUTROPHIL %: 81 %
PLATELETS: 212 x10ˆ3/uL (ref 140–450)
RBC: 3.18 x10ˆ6/uL — ABNORMAL LOW (ref 3.63–4.92)
RDW: 17.1 % — ABNORMAL HIGH (ref 12.0–15.0)
WBC: 9.6 x10ˆ3/uL (ref 3.5–11.0)

## 2017-11-02 LAB — BASIC METABOLIC PANEL
ANION GAP: 6 mmol/L (ref 4–13)
BUN/CREA RATIO: 18 (ref 6–22)
BUN: 11 mg/dL (ref 8–25)
BUN: 11 mg/dL (ref 8–25)
CALCIUM: 8.6 mg/dL (ref 8.5–10.2)
CHLORIDE: 87 mmol/L — ABNORMAL LOW (ref 96–111)
CO2 TOTAL: 27 mmol/L (ref 22–32)
CREATININE: 0.62 mg/dL (ref 0.49–1.10)
ESTIMATED GFR: >59 mL/min/1.73mˆ2 (ref >59)
GLUCOSE: 217 mg/dL — ABNORMAL HIGH (ref 65–139)
POTASSIUM: 3.3 mmol/L — ABNORMAL LOW (ref 3.5–5.1)
SODIUM: 120 mmol/L — CL (ref 136–145)

## 2017-11-02 LAB — PHOSPHORUS: PHOSPHORUS: 3.4 mg/dL (ref 2.3–4.0)

## 2017-11-02 MED ORDER — TRACE ELEMENTS CR-CU-MN-SE-ZN 10 MCG-1 MG-0.5 MG-60 MCG-5MG/ML IV SOLN
INTRAVENOUS | Status: AC
Start: 2017-11-02 — End: 2017-11-03
  Filled 2017-11-02: qty 500

## 2017-11-02 MED ORDER — INSULIN LISPRO 100 UNIT/ML SUB-Q SSIP
2.00 [IU] | INJECTION | Freq: Four times a day (QID) | SUBCUTANEOUS | Status: DC | PRN
Start: 2017-11-02 — End: 2017-11-06
  Administered 2017-11-03 – 2017-11-04 (×3): 4 [IU] via SUBCUTANEOUS
  Administered 2017-11-05: 6 [IU] via SUBCUTANEOUS
  Administered 2017-11-05: 0 [IU] via SUBCUTANEOUS
  Administered 2017-11-05: 6 [IU] via SUBCUTANEOUS
  Administered 2017-11-06: 4 [IU] via SUBCUTANEOUS
  Filled 2017-11-02: qty 3

## 2017-11-02 MED ORDER — INSULIN LISPRO (U-100) 100 UNIT/ML SUBCUTANEOUS SOLUTION
10.00 [IU] | Freq: Three times a day (TID) | SUBCUTANEOUS | Status: DC
Start: 2017-11-02 — End: 2017-11-04
  Administered 2017-11-02 (×2): 10 [IU] via SUBCUTANEOUS
  Administered 2017-11-03: 0 [IU] via SUBCUTANEOUS
  Administered 2017-11-03 – 2017-11-04 (×2): 10 [IU] via SUBCUTANEOUS

## 2017-11-02 MED ORDER — INSULIN GLARGINE (U-100) 100 UNIT/ML SUBCUTANEOUS SOLUTION
25.00 [IU] | Freq: Every evening | SUBCUTANEOUS | Status: DC
Start: 2017-11-02 — End: 2017-11-04
  Administered 2017-11-02 – 2017-11-03 (×3): 25 [IU] via SUBCUTANEOUS
  Filled 2017-11-02 (×2): qty 25

## 2017-11-02 MED ORDER — SODIUM CHLORIDE 0.9 % INTRAVENOUS SOLUTION
100.0000 mg | Freq: Once | INTRAVENOUS | Status: AC
Start: 2017-11-02 — End: 2017-11-02
  Administered 2017-11-02: 100 mg via INTRAVENOUS
  Administered 2017-11-02: 0 mg via INTRAVENOUS
  Filled 2017-11-02: qty 1

## 2017-11-02 MED ADMIN — docusate sodium 100 mg capsule: ORAL | @ 21:00:00

## 2017-11-02 MED ADMIN — insulin lispro (U-100) 100 unit/mL subcutaneous solution: SUBCUTANEOUS | @ 13:00:00

## 2017-11-02 MED ADMIN — Medication: INTRAVENOUS

## 2017-11-02 MED ADMIN — Medication: INTRAVENOUS | @ 23:00:00

## 2017-11-02 MED ADMIN — sodium chloride 0.9 % intravenous solution: ORAL | @ 21:00:00 | NDC 00338004904

## 2017-11-02 MED ADMIN — nystatin 100,000 unit/gram topical powder: ORAL | @ 08:00:00 | NDC 00574200815

## 2017-11-02 MED ADMIN — HYDROcodone 5 mg-acetaminophen 325 mg tablet: SUBCUTANEOUS | @ 21:00:00

## 2017-11-02 NOTE — Nurses Notes (Signed)
Patient is resting comfortably in bed. Patient drain dressing changed at 0600. Lab called critical sodium of 120. Service surgical oncology notified Halina Andreas. No orders obtained at this time. Phill Myron, RN  11/02/2017, 05:42

## 2017-11-02 NOTE — Care Plan (Signed)
Problem: Patient Care Overview (Adult,OB)  Goal: Plan of Care Review(Adult,OB)  The patient and/or their representative will communicate an understanding of their plan of care   Outcome: Ongoing (see interventions/notes)  Plan of care reviewed with patient. TPN therapy explained and initiated. Patient had no further questions.     Problem: Nutrition, Parenteral (Adult)  Prevent and manage potential problems including:  1. fluid/electrolyte imbalance  2. glycemic control impaired  3. infection  4. infusion-related complications  5. malnutrition   Goal: Signs and Symptoms of Listed Potential Problems Will be Absent, Minimized or Managed (Nutrition, Parenteral)  Signs and symptoms of listed potential problems will be absent, minimized or managed by discharge/transition of care (reference Nutrition, Parenteral (Adult) CPG).   Outcome: Ongoing (see interventions/notes)

## 2017-11-02 NOTE — Nurses Notes (Deleted)
Upon assessment, patient's left leg (between ankle and knee) are noted to be inflamed, red, and hot to touch.  Stevan Budi on service paged to notify.

## 2017-11-02 NOTE — Consults (Signed)
[facility]  Endocrinology/Diabetes Consult      Encounter Start Date:      10/25/2017  Inpatient Admission Date:  10/25/2017  Name:             [name]  Age:             71 y.o.  Attending:             Nicola Police, MD  PCP             No Pcp    Assessment  1. 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     Recommendations:   1.  Will recommend increasing lantus to 25 units daily, starting tonight.    2..  Will recommend increasing scheduled Humalog to 10 units TID  AC.  4.  Will recommend switching to conservative insulin sliding scale.   Orders changed in the system and primary team notified through text page  5.  Thank you for the consult, we will continue to follow. Please page with any questions.      Subjective:   Patient is stable Post OP but remains hyperglycemic after starting lantus and scheduled Humalog . Patient is well tolerating soft mechanical diet now.   Recent Labs      10/31/17   1243  10/31/17   1701  10/31/17   2128  11/01/17   0615  11/01/17   1131  11/01/17   1138  11/01/17   1554  11/01/17   2020  11/02/17   0447  11/02/17   0611   GLUIP  112*  191*  237*  196*  64*  73  94  376*  240*  258*     Examination:  Vital Signs:  Temp  Avg: 36.3 C (97.3 F)  Min: 35.8 C (96.4 F)  Max: 36.7 C (98.1 F)    Pulse  Avg: 72.5  Min: 67  Max: 80 BP  Min: 125/54  Max: 175/56   Resp  Avg: 10  Min: 0  Max: 15 SpO2  Avg: 97.7 %  Min: 94 %  Max: 100 %   Pain Score (Numeric, Faces): 7        Alwyn Pea MD  Assistant Professor  Department of Internal Medicine, [facility]  Section of Endocrinology

## 2017-11-02 NOTE — Progress Notes (Signed)
[facility]  Surgical Oncology Progress Note    [name],[name], 71 y.o. female  Date of Birth:  January 04, 1946  Date of Admission:  10/25/2017  Date of service: 11/02/2017    Subjective: No acute events overnight.  Doing well this morning.    Physical Exam:  BP (!) 142/63  Pulse 80  Temp 36.7 C (98.1 F)  Resp (!) 0  Ht 1.549 m (5\' 1" )  Wt 83.6 kg (184 lb 4.9 oz)  SpO2 100%  BMI 34.82 kg/m2    General: Appears in no acute distress, appears as stated age  Eyes: Conjunctiva clear., Pupils equal and round.   HENT:ENMT without erythema or injection, mucous membranes moist.  Lungs: Even and nonlabored respiration  Cardiovascular: regular rate and rhythm  Abdomen: Soft, non-tender, non-distended  Extremities: Grossly moving all extremities  Skin: Skin warm and dry    I/O:    Date 11/01/17 0700 - 11/02/17 0659 11/02/17 0700 - 11/03/17 0659   Shift 6314-9702 1500-2259 2300-0659 24 Hour Total 0700-1459 1500-2259 2300-0659 24 Hour Total   I  N  T  A  K  E   P.O. 240 220 60 520 240   240      Oral 240 220 60 520 240   240    I.V.  (mL/kg/hr)  135  (0.2) 270  (0.4) 405  (0.2) 90   90      Volume (adult custom parenteral nutrition)  135 270 405 90   90    Shift Total  (mL/kg) 240  (2.89) 355  (4.27) 330  (3.95) 925  (11.06) 330  (3.95)   330  (3.95)   O  U  T  P  U  T   Urine  (mL/kg/hr)              Urine Occurrence 1 x 0 x 1 x 2 x        Emesis              Emesis Occurrence  0 x  0 x        Drains 0 0 125 125 25   25      Drain Output (Drain (Miscellaneous) JP Right Abdomen) 0 0 125 125 25   25    Other              Other  0 x  0 x        Stool              Stool Occurrence  0 x 1 x 1 x        Shift Total  (mL/kg) 0  (0) 0  (0) 125  (1.5) 125  (1.5) 25  (0.3)   25  (0.3)   Weight (kg) 83.1 83.1 83.6 83.6 83.6 83.6 83.6 83.6       Nutrition/Residuals:  MNT PROTOCOL FOR DIETITIAN  DIETARY ORAL SUPPLEMENTS Oral Supplements with tray: Ensure Clear-Apple; BREAKFAST/LUNCH/DINNER; 1 Can  adult custom  parenteral nutrition  DIET CLEAR LIQUID Restrict fluids to: 1000 ML    Labs  Reviewed:   Lab Results Today:    Results for orders placed or performed during the hospital encounter of 10/25/17 (from the past 24 hour(s))   POC BLOOD GLUCOSE (RESULTS)   Result Value Ref Range    GLUCOSE, POC 94 70 - 105 mg/dL   POC BLOOD GLUCOSE (RESULTS)   Result Value Ref Range    GLUCOSE, POC 376 (H)  70 - 105 mg/dL   BASIC METABOLIC PANEL   Result Value Ref Range    SODIUM 120 (LL) 136 - 145 mmol/L    POTASSIUM 3.3 (L) 3.5 - 5.1 mmol/L    CHLORIDE 87 (L) 96 - 111 mmol/L    CO2 TOTAL 27 22 - 32 mmol/L    ANION GAP 6 4 - 13 mmol/L    CALCIUM 8.6 8.5 - 10.2 mg/dL    GLUCOSE 217 (H) 65 - 139 mg/dL    BUN 11 8 - 25 mg/dL    CREATININE 0.62 0.49 - 1.10 mg/dL    BUN/CREA RATIO 18 6 - 22    ESTIMATED GFR >59 >59 mL/min/1.38m^2   MAGNESIUM   Result Value Ref Range    MAGNESIUM 1.5 (L) 1.6 - 2.5 mg/dL   PHOSPHORUS   Result Value Ref Range    PHOSPHORUS 3.4 2.3 - 4.0 mg/dL   HEPATIC FUNCTION PANEL   Result Value Ref Range    ALBUMIN 2.0 (L) 3.4 - 4.8 g/dL    ALKALINE PHOSPHATASE 78 <150 U/L    ALT (SGPT) 8 <55 U/L    AST (SGOT) 14 8 - 41 U/L    BILIRUBIN TOTAL 0.5 0.3 - 1.3 mg/dL    BILIRUBIN DIRECT 0.2 <0.3 mg/dL    PROTEIN TOTAL 4.7 (L) 6.0 - 8.0 g/dL   CBC WITH DIFF   Result Value Ref Range    WBC 9.6 3.5 - 11.0 x10^3/uL    RBC 3.18 (L) 3.63 - 4.92 x10^6/uL    HGB 9.1 (L) 11.2 - 15.2 g/dL    HCT 26.5 (L) 33.5 - 45.2 %    MCV 83.5 78.0 - 100.0 fL    MCH 28.7 27.4 - 33.0 pg    MCHC 34.4 32.5 - 35.8 g/dL    RDW 17.1 (H) 12.0 - 15.0 %    PLATELETS 212 140 - 450 x10^3/uL    MPV 9.0 7.5 - 11.5 fL    NEUTROPHIL % 81 %    LYMPHOCYTE % 4 %    MONOCYTE % 9 %    EOSINOPHIL % 5 %    BASOPHIL % 1 %    NEUTROPHIL # 7.82 (H) 1.50 - 7.70 x10^3/uL    LYMPHOCYTE # 0.36 (L) 1.00 - 4.80 x10^3/uL    MONOCYTE # 0.90 0.30 - 1.00 x10^3/uL    EOSINOPHIL # 0.50 0.00 - 0.50 x10^3/uL    BASOPHIL # 0.06 0.00 - 0.20 x10^3/uL   POC BLOOD GLUCOSE (RESULTS)   Result Value  Ref Range    GLUCOSE, POC 240 (H) 70 - 105 mg/dL   POC BLOOD GLUCOSE (RESULTS)   Result Value Ref Range    GLUCOSE, POC 258 (H) 70 - 105 mg/dL     Radiology  No new imaging is available for review    Assessment/ Plan:      Active Hospital Problems    Diagnosis   . Primary Problem: Pancreatic cancer (CMS HCC)       [place] a 71 y.o.femalePOD 8 from robotic whipple for pancreatic cancer    - PO intake continue to be minimal.  Will continue with calorie count 11/20 to 11/22.  Will place PICC line and start TPN today  - Continue with clear liquid diet.  Will add free water restriction to one liter per day for worsening hyponatremia.  - Appreciate endo's recs, will adjust diabetes medications as appropriate  - OOB and ambulate  - PT/OT  - DVT ppx:  lovenox and SCD  - Dispo: home with home health infusion    Maryelizabeth Kaufmann, MD  11/02/2017, 11:40       I saw and examined the patient.  I reviewed the resident's note.  I agree with the findings and plan of care as documented in the resident's note.      Lynwood Dawley, MD

## 2017-11-02 NOTE — Care Plan (Signed)
Problem: Patient Care Overview (Adult,OB)  Goal: Plan of Care Review(Adult,OB)  The patient and/or their representative will communicate an understanding of their plan of care   Outcome: Ongoing (see interventions/notes)    Goal: Individualization/Patient Specific Goal(Adult/OB)  Outcome: Ongoing (see interventions/notes)    Goal: Interdisciplinary Rounds/Family Conf  Outcome: Ongoing (see interventions/notes)      Problem: Perioperative Period (Adult)  Prevent and manage potential problems including:1. bleeding2. gastrointestinal complications3. hypothermia4. infection5. pain6. perioperative injury7. respiratory compromise8. situational response9. urinary retention10. venous [id number]. wound complications   Goal: Signs and Symptoms of Listed Potential Problems Will be Absent or Manageable (Perioperative Period)  Signs and symptoms of listed potential problems will be absent or manageable by discharge/transition of care (reference Perioperative Period (Adult) CPG).   Outcome: Ongoing (see interventions/notes)      Problem: Nutrition, Imbalanced: Inadequate Oral Intake (Adult)  Goal: Improved Oral Intake  Patient will demonstrate the desired outcomes by discharge/transition of care.   Outcome: Ongoing (see interventions/notes)    Goal: Prevent Further Weight Loss  Patient will demonstrate the desired outcomes by discharge/transition of care.   Outcome: Ongoing (see interventions/notes)      Problem: Nutrition, Parenteral (Adult)  Prevent and manage potential problems including:  1. fluid/electrolyte imbalance  2. glycemic control impaired  3. infection  4. infusion-related complications  5. malnutrition   Goal: Signs and Symptoms of Listed Potential Problems Will be Absent, Minimized or Managed (Nutrition, Parenteral)  Signs and symptoms of listed potential problems will be absent, minimized or managed by discharge/transition of care (reference Nutrition, Parenteral (Adult) CPG).   Outcome: Ongoing (see  interventions/notes)      Comments: POC reviewed thoroughly with patient throughout shift.  POC reviewed with patient's son at bedside.  Patient placed on fluid restriction 1,000 ml r/t low Na levels. PO intake still remains poor. TPN infusing per MAR.  Ambulating with stand-by assist and RW in room and hallways--tolerated well, patient reporting c/o fatigue after walks around unit. PRN Tylenol given for pain management.  Drain dressing monitored and changed x2 r/t saturation from body fluid. Blood glucose monitored and insulin administered per MAR.

## 2017-11-03 LAB — CBC
HCT: 26.5 % — ABNORMAL LOW (ref 33.5–45.2)
HGB: 9.4 g/dL — ABNORMAL LOW (ref 11.2–15.2)
HGB: 9.4 g/dL — ABNORMAL LOW (ref 11.2–15.2)
MCH: 29.7 pg (ref 27.4–33.0)
MCHC: 35.6 g/dL (ref 32.5–35.8)
MCV: 83.6 fL (ref 78.0–100.0)
MPV: 8.8 fL (ref 7.5–11.5)
PLATELETS: 307 x10ˆ3/uL (ref 140–450)
RBC: 3.17 x10ˆ6/uL — ABNORMAL LOW (ref 3.63–4.92)
RDW: 17.4 % — ABNORMAL HIGH (ref 12.0–15.0)
WBC: 9.1 x10ˆ3/uL (ref 3.5–11.0)

## 2017-11-03 LAB — BASIC METABOLIC PANEL
ANION GAP: 7 mmol/L (ref 4–13)
BUN/CREA RATIO: 18 (ref 6–22)
BUN: 11 mg/dL (ref 8–25)
CALCIUM: 9.6 mg/dL (ref 8.5–10.2)
CHLORIDE: 89 mmol/L — ABNORMAL LOW (ref 96–111)
CO2 TOTAL: 28 mmol/L (ref 22–32)
CREATININE: 0.6 mg/dL (ref 0.49–1.10)
CREATININE: 0.6 mg/dL (ref 0.49–1.10)
ESTIMATED GFR: >59 mL/min/1.73mˆ2 (ref >59)
GLUCOSE: 144 mg/dL — ABNORMAL HIGH (ref 65–139)
POTASSIUM: 3.3 mmol/L — ABNORMAL LOW (ref 3.5–5.1)
SODIUM: 124 mmol/L — ABNORMAL LOW (ref 136–145)

## 2017-11-03 LAB — HEPATIC FUNCTION PANEL
ALBUMIN: 2.1 g/dL — ABNORMAL LOW (ref 3.4–4.8)
ALKALINE PHOSPHATASE: 81 U/L (ref <150)
ALT (SGPT): 7 U/L (ref <55)
ALT (SGPT): 7 U/L (ref <55)
AST (SGOT): 12 U/L (ref 8–41)

## 2017-11-03 LAB — POC BLOOD GLUCOSE (RESULTS)
GLUCOSE, POC: 156 mg/dL — ABNORMAL HIGH (ref 70–105)
GLUCOSE, POC: 151 mg/dL — ABNORMAL HIGH (ref 70–105)
GLUCOSE, POC: 120 mg/dL — ABNORMAL HIGH (ref 70–105)
GLUCOSE, POC: 183 mg/dL — ABNORMAL HIGH (ref 70–105)
GLUCOSE, POC: 263 mg/dL — ABNORMAL HIGH (ref 70–105)

## 2017-11-03 LAB — PHOSPHORUS: PHOSPHORUS: 3.3 mg/dL (ref 2.3–4.0)

## 2017-11-03 MED ORDER — POTASSIUM CHLORIDE ER 20 MEQ TABLET,EXTENDED RELEASE(PART/CRYST)
40.0000 meq | ORAL_TABLET | ORAL | Status: AC
Start: 2017-11-03 — End: 2017-11-03
  Administered 2017-11-03: 40 meq via ORAL
  Filled 2017-11-03: qty 2

## 2017-11-03 MED ORDER — TRACE ELEMENTS CR-CU-MN-SE-ZN 10 MCG-1 MG-0.5 MG-60 MCG-5MG/ML IV SOLN
INTRAVENOUS | Status: AC
Start: 2017-11-03 — End: 2017-11-04
  Filled 2017-11-03: qty 500

## 2017-11-03 MED ORDER — HYDROCODONE 5 MG-ACETAMINOPHEN 325 MG TABLET
1.0000 | ORAL_TABLET | ORAL | Status: DC | PRN
Start: 2017-11-03 — End: 2017-11-16
  Administered 2017-11-03 – 2017-11-16 (×37): 1 via ORAL
  Filled 2017-11-03 (×37): qty 1

## 2017-11-03 MED ORDER — MAGNESIUM SULFATE 4 GRAM/100 ML (4 %) IN WATER INTRAVENOUS PIGGYBACK
4.0000 g | INJECTION | Freq: Once | INTRAVENOUS | Status: AC
Start: 2017-11-03 — End: 2017-11-03
  Administered 2017-11-03: 4 g via INTRAVENOUS
  Administered 2017-11-03: 0 g via INTRAVENOUS
  Filled 2017-11-03: qty 100

## 2017-11-03 MED ADMIN — sodium chloride 0.9 % (flush) injection syringe: @ 06:00:00

## 2017-11-03 MED ADMIN — ondansetron HCl (PF) 4 mg/2 mL injection solution: INTRAVENOUS | @ 23:00:00

## 2017-11-03 MED ADMIN — insulin lispro (U-100) 100 unit/mL subcutaneous solution: SUBCUTANEOUS | @ 07:00:00

## 2017-11-03 MED ADMIN — sodium chloride 0.9 % intravenous solution: SUBCUTANEOUS | @ 06:00:00

## 2017-11-03 MED ADMIN — sodium chloride 0.9 % (flush) injection syringe: ORAL | @ 22:00:00

## 2017-11-03 MED ADMIN — lactated Ringers intravenous solution: ORAL | @ 08:00:00 | NDC 00338011704

## 2017-11-03 MED ADMIN — sodium chloride 0.9 % (flush) injection syringe: INTRAVENOUS | @ 22:00:00

## 2017-11-03 NOTE — Nurses Notes (Signed)
patient still in pain 8/10 to her RUQ abdomen after tylenol was given at 2017,no other pain meds ordered for her,text paged service/Dr.Cyrus/Surg.Onc with order for NORCO 1tab PRN q4.

## 2017-11-03 NOTE — Progress Notes (Signed)
[facility]  Surgical Oncology Progress Note    [name],[name], 71 y.o. female  Date of Birth:  Feb 18, 1946  Date of Admission:  10/25/2017  Date of service: 11/03/2017    Subjective: No acute events overnight.  Patient is doing well this morning.    Physical Exam:  BP (!) 132/42  Pulse 76  Temp 36.8 C (98.2 F)  Resp (!) 0  Ht 1.549 m (5\' 1" )  Wt 83.6 kg (184 lb 4.9 oz)  SpO2 95%  BMI 34.82 kg/m2    General: Appears in no acute distress, appears as stated age  Eyes: Conjunctiva clear., Pupils equal and round.   HENT:ENMT without erythema or injection, mucous membranes moist.  Lungs: Even and nonlabored respiration  Cardiovascular: regular rate and rhythm  Abdomen: Soft, mildly tender on palpation, incision is clean dry and intact, non-distended  Extremities: Grossly moving all extremities  Skin: Skin warm and dry  Neurologic: Grossly normal    I/O:    Date 11/02/17 0700 - 11/03/17 0659 11/03/17 0700 - 11/04/17 0659   Shift 0700-1459 1500-2259 2300-0659 24 Hour Total 0700-1459 1500-2259 2300-0659 24 Hour Total   I  N  T  A  K  E   P.O. 480 60 30 570          Oral 480 60 30 570        I.V.  (mL/kg/hr) 315  (0.47) 247.5  (0.37) 321  (0.48) 883.5  (0.44)          IVPB Volume   51 51          Volume (adult custom parenteral nutrition) 315 180  495          Volume (adult custom parenteral nutrition)  67.5 270 337.5        Shift Total  (mL/kg) 795  (9.51) 307.5  (3.68) 351  (4.2) 1453.5  (17.39)       O  U  T  P  U  T   Urine  (mL/kg/hr)   650  (0.97) 650  (0.32)          Urine (Voided)   650 650          Urine Occurrence 3 x 3 x 0 x 6 x        Emesis              Emesis Occurrence 0 x 0 x 0 x 0 x        Drains 55 105 85 245          Drain Output (Drain (Miscellaneous) JP Right Abdomen) 55 105 85 245        Stool              Stool Occurrence 2 x 0 x 1 x 3 x        Shift Total  (mL/kg) 55  (0.66) 105  (1.26) 735  (8.79) 895  (10.71)       Weight (kg) 83.6 83.6 83.6 83.6 83.6 83.6 83.6 83.6        Nutrition/Residuals:  MNT PROTOCOL FOR DIETITIAN  DIETARY ORAL SUPPLEMENTS Oral Supplements with tray: Ensure Clear-Apple; BREAKFAST/LUNCH/DINNER; 1 Can  DIET CLEAR LIQUID Restrict fluids to: 1000 ML  adult custom parenteral nutrition    Labs  Reviewed:   Lab Results Today:    Results for orders placed or performed during the hospital encounter of 10/25/17 (from the past 24 hour(s))   POC BLOOD GLUCOSE (  RESULTS)   Result Value Ref Range    GLUCOSE, POC 151 (H) 70 - 105 mg/dL   POC BLOOD GLUCOSE (RESULTS)   Result Value Ref Range    GLUCOSE, POC 189 (H) 70 - 105 mg/dL   POC BLOOD GLUCOSE (RESULTS)   Result Value Ref Range    GLUCOSE, POC 149 (H) 70 - 105 mg/dL   POC BLOOD GLUCOSE (RESULTS)   Result Value Ref Range    GLUCOSE, POC 156 (H) 70 - 105 mg/dL   BASIC METABOLIC PANEL   Result Value Ref Range    SODIUM 124 (L) 136 - 145 mmol/L    POTASSIUM 3.3 (L) 3.5 - 5.1 mmol/L    CHLORIDE 89 (L) 96 - 111 mmol/L    CO2 TOTAL 28 22 - 32 mmol/L    ANION GAP 7 4 - 13 mmol/L    CALCIUM 9.6 8.5 - 10.2 mg/dL    GLUCOSE 144 (H) 65 - 139 mg/dL    BUN 11 8 - 25 mg/dL    CREATININE 0.60 0.49 - 1.10 mg/dL    BUN/CREA RATIO 18 6 - 22    ESTIMATED GFR >59 >59 mL/min/1.32m^2   CBC   Result Value Ref Range    WBC 9.1 3.5 - 11.0 x10^3/uL    RBC 3.17 (L) 3.63 - 4.92 x10^6/uL    HGB 9.4 (L) 11.2 - 15.2 g/dL    HCT 26.5 (L) 33.5 - 45.2 %    MCV 83.6 78.0 - 100.0 fL    MCH 29.7 27.4 - 33.0 pg    MCHC 35.6 32.5 - 35.8 g/dL    RDW 17.4 (H) 12.0 - 15.0 %    PLATELETS 307 140 - 450 x10^3/uL    MPV 8.8 7.5 - 11.5 fL   MAGNESIUM   Result Value Ref Range    MAGNESIUM 1.0 (LL) 1.6 - 2.5 mg/dL   PHOSPHORUS   Result Value Ref Range    PHOSPHORUS 3.3 2.3 - 4.0 mg/dL   HEPATIC FUNCTION PANEL   Result Value Ref Range    ALBUMIN 2.1 (L) 3.4 - 4.8 g/dL    ALKALINE PHOSPHATASE 81 <150 U/L    ALT (SGPT) 7 <55 U/L    AST (SGOT) 12 8 - 41 U/L    BILIRUBIN TOTAL 0.4 0.3 - 1.3 mg/dL    BILIRUBIN DIRECT 0.2 <0.3 mg/dL    PROTEIN TOTAL 4.9 (L) 6.0 - 8.0 g/dL    POC BLOOD GLUCOSE (RESULTS)   Result Value Ref Range    GLUCOSE, POC 151 (H) 70 - 105 mg/dL     Radiology  No new imaging is available for review    Assessment/ Plan:      Active Hospital Problems    Diagnosis   . Primary Problem: Pancreatic cancer (CMS HCC)       Ziebach a 71 y.o.femalePOD 9 from robotic whipple for pancreatic cancer    - Continue with clear liquid diet.  Continue with free water restriction to one liter per day for worsening hyponatremia  - Continue with TPN.  Will try to cycle TPN today  - Appreciate endo's recs, glucose is controlled  - OOB/ambulate  - PT/OT  - DVT ppx: lovenox and SCD    Maryelizabeth Kaufmann, MD  11/03/2017, 08:45   .    I saw and examined the patient.  I reviewed the resident's note.  I agree with the findings and plan of care as documented in the resident's note.  Drain clearing up. Cycle TPN.  Ambulate, IS.    Lynwood Dawley, MD

## 2017-11-03 NOTE — Care Plan (Signed)
Problem: Patient Care Overview (Adult,OB)  Goal: Plan of Care Review(Adult,OB)  The patient and/or their representative will communicate an understanding of their plan of care   Outcome: Ongoing (see interventions/notes)  Reviewed plan of care with patient. Patient calm and cooperative and understood plan of care.     Problem: Nutrition, Imbalanced: Inadequate Oral Intake (Adult)  Goal: Improved Oral Intake  Patient will demonstrate the desired outcomes by discharge/transition of care.   Outcome: Ongoing (see interventions/notes)  Patient still has minimal appetite. Continuing to monitor input of patient throughout shift. TPN maintained.

## 2017-11-04 LAB — HEPATIC FUNCTION PANEL
ALKALINE PHOSPHATASE: 84 U/L (ref <150)
ALT (SGPT): 8 U/L (ref <55)
ALT (SGPT): 8 U/L (ref <55)
AST (SGOT): 16 U/L (ref 8–41)
BILIRUBIN DIRECT: 0.2 mg/dL (ref <0.3)
BILIRUBIN TOTAL: 0.3 mg/dL (ref 0.3–1.3)

## 2017-11-04 LAB — CBC
HCT: 27.6 % — ABNORMAL LOW (ref 33.5–45.2)
HGB: 9.3 g/dL — ABNORMAL LOW (ref 11.2–15.2)
MCH: 28.7 pg (ref 27.4–33.0)
MCHC: 33.6 g/dL (ref 32.5–35.8)
MCV: 85.4 fL (ref 78.0–100.0)
MPV: 8.6 fL (ref 7.5–11.5)
PLATELETS: 241 x10ˆ3/uL (ref 140–450)
RBC: 3.23 x10ˆ6/uL — ABNORMAL LOW (ref 3.63–4.92)
RDW: 16.7 % — ABNORMAL HIGH (ref 12.0–15.0)
WBC: 7.8 x10ˆ3/uL (ref 3.5–11.0)

## 2017-11-04 LAB — BASIC METABOLIC PANEL
ANION GAP: 5 mmol/L (ref 4–13)
BUN/CREA RATIO: 20 (ref 6–22)
BUN: 14 mg/dL (ref 8–25)
CALCIUM: 9.1 mg/dL (ref 8.5–10.2)
CHLORIDE: 92 mmol/L — ABNORMAL LOW (ref 96–111)
CO2 TOTAL: 25 mmol/L (ref 22–32)
CO2 TOTAL: 25 mmol/L (ref 22–32)
CREATININE: 0.7 mg/dL (ref 0.49–1.10)
CREATININE: 0.7 mg/dL (ref 0.49–1.10)
ESTIMATED GFR: >59 mL/min/1.73mˆ2 (ref >59)
GLUCOSE: 381 mg/dL — ABNORMAL HIGH (ref 65–139)
POTASSIUM: 4.2 mmol/L (ref 3.5–5.1)
POTASSIUM: 4.2 mmol/L (ref 3.5–5.1)
SODIUM: 122 mmol/L — ABNORMAL LOW (ref 136–145)

## 2017-11-04 LAB — POC BLOOD GLUCOSE (RESULTS)
GLUCOSE, POC: 385 mg/dL — ABNORMAL HIGH (ref 70–105)
GLUCOSE, POC: 237 mg/dL — ABNORMAL HIGH (ref 70–105)
GLUCOSE, POC: 66 mg/dL — ABNORMAL LOW (ref 70–105)
GLUCOSE, POC: 70 mg/dL (ref 70–105)
GLUCOSE, POC: 160 mg/dL — ABNORMAL HIGH (ref 70–105)
GLUCOSE, POC: 195 mg/dL — ABNORMAL HIGH (ref 70–105)

## 2017-11-04 LAB — PHOSPHORUS: PHOSPHORUS: 3.8 mg/dL (ref 2.3–4.0)

## 2017-11-04 MED ORDER — MAGNESIUM SULFATE 2 GRAM/50 ML (4 %) IN WATER INTRAVENOUS PIGGYBACK
2.0000 g | INJECTION | Freq: Once | INTRAVENOUS | Status: AC
Start: 2017-11-04 — End: 2017-11-04
  Administered 2017-11-04: 2 g via INTRAVENOUS
  Administered 2017-11-04: 0 g via INTRAVENOUS
  Filled 2017-11-04: qty 50

## 2017-11-04 MED ORDER — TRACE ELEMENTS CR-CU-MN-SE-ZN 10 MCG-1 MG-0.5 MG-60 MCG-5MG/ML IV SOLN
INTRAVENOUS | Status: AC
Start: 2017-11-04 — End: 2017-11-05
  Filled 2017-11-04: qty 550

## 2017-11-04 MED ORDER — INSULIN NPH ISOPHANE U-100 HUMAN 100 UNIT/ML SUBCUTANEOUS SUSPENSION
15.0000 [IU] | Freq: Three times a day (TID) | SUBCUTANEOUS | Status: DC
  Administered 2017-11-04 (×2): 0 [IU] via SUBCUTANEOUS
  Administered 2017-11-04 – 2017-11-05 (×2): 15 [IU] via SUBCUTANEOUS
  Filled 2017-11-04: qty 300

## 2017-11-04 MED ORDER — INSULIN LISPRO (U-100) 100 UNIT/ML SUBCUTANEOUS SOLUTION
5.0000 [IU] | Freq: Three times a day (TID) | SUBCUTANEOUS | Status: DC
Start: 2017-11-04 — End: 2017-11-05
  Administered 2017-11-04 – 2017-11-05 (×2): 5 [IU] via SUBCUTANEOUS

## 2017-11-04 MED ORDER — TRACE ELEMENTS CR-CU-MN-SE-ZN 10 MCG-1 MG-0.5 MG-60 MCG-5MG/ML IV SOLN
INTRAVENOUS | Status: DC
Start: 2017-11-04 — End: 2017-11-04

## 2017-11-04 MED ADMIN — sodium chloride 0.9 % (flush) injection syringe: SUBCUTANEOUS | @ 14:00:00

## 2017-11-04 MED ADMIN — cefTRIAXone 1 gram/50 mL in dextrose (iso-osmot) intravenous piggyback: INTRAVENOUS | @ 09:00:00

## 2017-11-04 NOTE — Nurses Notes (Signed)
Text paged service/dr.Cyrus/Surg.Onc RE: pt states she had diarrhea 6to 8x in 24hrs BM.small amount,greenish and mucoid.  awaiting for orders/callback.will continue to monitor.

## 2017-11-04 NOTE — Care Plan (Signed)
Problem: Patient Care Overview (Adult,OB)  Goal: Plan of Care Review(Adult,OB)  The patient and/or their representative will communicate an understanding of their plan of care   Outcome: Ongoing (see interventions/notes)  Patient is alert and oriented. Assessment per doc flowsheet, Vital signs stable, reviewed and assessed.Medicated 3x for pain.patient had 6-8x diarrhea,service aware.one time nausea,PRN zofran was given. Patient educated on medications given.TPN ongoing at 100 ml/hr at variable rate.Patient labs monitored. Patient educated to call not fall. Plan of care reviewed with patient; verbalizes understanding and has no questions at this time.     Problem: Nutrition, Imbalanced: Inadequate Oral Intake (Adult)  Goal: Improved Oral Intake  Patient will demonstrate the desired outcomes by discharge/transition of care.   Outcome: Ongoing (see interventions/notes)      Problem: Nutrition, Parenteral (Adult)  Prevent and manage potential problems including:  1. fluid/electrolyte imbalance  2. glycemic control impaired  3. infection  4. infusion-related complications  5. malnutrition   Goal: Signs and Symptoms of Listed Potential Problems Will be Absent, Minimized or Managed (Nutrition, Parenteral)  Signs and symptoms of listed potential problems will be absent, minimized or managed by discharge/transition of care (reference Nutrition, Parenteral (Adult) CPG).   Outcome: Ongoing (see interventions/notes)      Problem: Diarrhea (Adult)  Goal: Identify Related Risk Factors and Signs and Symptoms  Related risk factors and signs and symptoms are identified upon initiation of Human Response Clinical Practice Guideline (CPG).  Outcome: Completed Date Met: 11/04/17    Goal: Improved/Reduced Symptoms  Patient will demonstrate the desired outcomes by discharge/transition of care.  Outcome: Ongoing (see interventions/notes)

## 2017-11-04 NOTE — Nurses Notes (Signed)
Patient stable and seen resting in bed.patient assessment and VS per flowsheet. Patient c/o abdominal pain,scheduled tylenol to be given,patient also c/o  frequent loose BM today,she was on colace and senna.denies N/V at this time.dressing around JP drain is soaked with tan,cloudy drainage.dressing changed with 4x4 gauze and secured with paper tape.Fall and Skin precautions maintained.patient Labs.monitored.Incentive Spirometer use encouraged,SCD's to B/L.will continue to monitor,bedside table and phone within reach.Encouraged to call for assistance.

## 2017-11-04 NOTE — Consults (Signed)
[facility]  Endocrinology/Diabetes Consult      Encounter Start Date:      10/25/2017  Inpatient Admission Date:  10/25/2017  Name:             [name]  Age:             71 y.o.  Attending:             Nicola Police, MD  PCP             No Pcp    Assessment  1. 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   She is now on TPN with 1 can of ensure with clear liquid diet 3 times a day.  This is causing her sugars to go higher.    Recommendations:   1. Will recommend discontinuing lantus.  2. Start NPH 15U q8H.  3. Change humalog to 5U premeals with each can of ensure. If possible please change ensure to glucerna.  3. Continue with the present sliding scale.  Orders changed in the system and primary team notified through text page  Thank you for the consult, we will continue to follow. Please page with any questions.      Subjective:   Patient is stable Post OP but remains hyperglycemic after starting lantus and scheduled Humalog . Patient is well tolerating soft mechanical diet now.   Recent Labs      11/02/17   1109  11/02/17   1744  11/02/17   2014  11/03/17   0534  11/03/17   0631  11/03/17   1107  11/03/17   1711  11/03/17   2112  11/04/17   0552   GLUIP  151*  189*  149*  156*  151*  120*  183*  263*  385*     Examination:  Vital Signs:  Temp  Avg: 36.9 C (98.4 F)  Min: 36.6 C (97.9 F)  Max: 37.2 C (99 F)    Pulse  Avg: 80.8  Min: 77  Max: 88 BP  Min: 155/82  Max: 164/90   Resp  Avg: 3  Min: 0  Max: 18 SpO2  Avg: 94 %  Min: 85 %  Max: 97 %   Pain Score (Numeric, Faces): 3        Alwyn Pea MD  Assistant Professor  Department of Internal Medicine, [facility]  Section of Endocrinology

## 2017-11-04 NOTE — Progress Notes (Signed)
[name], 71 y.o. female  Date of Admission:  10/25/2017  Date of Birth:  09-24-1946  Date of Service:  11/04/2017    Post Op Day: 10 Days Post-Op S/P Procedure(s) (LRB):  ROBOTIC WHIPPLE (N/A)  BLOCK PRE-OP (N/A)  XI ROBOT SUPPLY CARD (N/A)    Chief Complaint:Pancreatic cancer (CMS [place])    Subjective: NAEO. Denies F/C, CP, SOB, N/V.    Objective:  Filed Vitals:    11/04/17 0036 11/04/17 0348 11/04/17 0351 11/04/17 0725   BP:  (!) 160/58     Pulse:  80     Resp:  (!) 0     Temp: 36.6 C (97.9 F)  36.9 C (98.4 F) 36.7 C (98.1 F)   SpO2:  94%       Pain Score (Numeric, Faces): 2    Home Medications:  Lipitor, celexa, HCTZ, losartan, toprol XL    Current Medications:    Current Facility-Administered Medications:  acetaminophen (TYLENOL) tablet 650 mg Oral Q4H PRN   adult custom variable rate parenteral nutrition  Intravenous Continuous   atorvastatin (LIPITOR) tablet 10 mg Oral QPM   citalopram (CELEXA) tablet 20 mg Oral Daily   docusate sodium (COLACE) capsule 100 mg Oral 2x/day   enoxaparin PF (LOVENOX) 40 mg/0.4 mL SubQ injection 40 mg Subcutaneous Q24H   hydrALAZINE (APRESOLINE) injection 10 mg 10 mg Intravenous Q4H PRN   hydroCHLOROthiazide (HYDRODIURIL) tablet 25 mg Oral Daily   HYDROcodone-acetaminophen (NORCO) 5-325 mg per tablet 1 Tab Oral Q4H PRN   insulin lispro (HUMALOG) 100 units/mL injection 5 Units Subcutaneous 3x/day AC   insulin NPH human 100 units/mL injection 15 Units Subcutaneous Q8HRS   LORazepam (ATIVAN) tablet 0.5 mg Oral Daily PRN   losartan (COZAAR) tablet 100 mg Oral Daily   magnesium sulfate 2 G in SW 50 mL premix IVPB 2 g Intravenous Once   metoprolol succinate (TOPROL-XL) 24 hr extended release tablet 50 mg Oral Daily   NS flush syringe 2 mL Intracatheter Q8HRS   And      NS flush syringe 2-6 mL Intracatheter Q1 MIN PRN   NS flush syringe 10-30 mL Intracatheter Q8HRS   NS flush  syringe 20-30 mL Intracatheter Q1 MIN PRN   ondansetron (ZOFRAN) 2 mg/mL injection 4 mg Intravenous Q6H PRN   pantoprazole (PROTONIX) delayed release tablet 40 mg Oral Daily before Breakfast   polyethylene glycol (MIRALAX) oral packet 17 g Oral Daily   potassium chloride 69meq per 41mL oral liquid 40 mEq Oral Daily with Breakfast   sennosides-docusate sodium (SENOKOT-S) 8.6-50mg  per tablet 1 Tab Oral 2x/day   SSIP insulin lispro (HUMALOG) 100 units/mL injection 2-6 Units Subcutaneous 4x/day PRN       Today's Physical Exam:  Constitutional: Appears in no acute distress  Eyes: Conjunctiva clear. Pupils equal and round. Sclera non-icteric.   ENT: ENMT without erythema or injection, mucous membranes moist.  Neck: Supple, symmetrical, trachea midline  Respiratory: Even and non-labored breathing  Cardiovascular: Regular rate and rhythm  Gastrointestinal: Soft, non-tender, non-distended, incision CDI  Neurologic: Grossly normal, Alert and oriented x3  Psychiatric: Normal    I/O:    Date 11/03/17 0700 - 11/04/17 3710 11/04/17 0700 - 11/05/17 6269  Shift [phone] 1500-2259 2300-0659 24 Hour Total 0700-1459 1500-2259 2300-0659 24 Hour Total   I  N  T  A  K  E   P.O.  100 0 100          Oral  100 0 100        I.V.  (mL/kg/hr) 360  (0.54) 330  (0.49) 800  (1.21) 1490  (0.75) 50   50      Volume (adult custom parenteral nutrition) 360 135  495          Volume (adult custom variable rate parenteral nutrition)  195 800 995 50   50    Shift Total  (mL/kg) 360  (4.31) 430  (5.14) 800  (9.65) 1590  (19.18) 50  (0.6)   50  (0.6)   O  U  T  P  U  T   Urine  (mL/kg/hr) 400  (0.6) 50  (0.07) 0  (0) 450  (0.23)          Urine (Voided) 400 50 0 450          Urine Occurrence  1 x 1 x 2 x        Emesis              Emesis Occurrence  0 x 0 x 0 x        Drains 50 150 170 370          Drain Output (Drain (Miscellaneous) JP Right Abdomen) 50 150 170 370        Stool  100  100          Stool Volume (ml)  100  100          Stool Occurrence 1 x  1 x 1 x 3 x        Shift Total  (mL/kg) 450  (5.38) 300  (3.59) 170  (2.05) 920  (11.1)       Weight (kg) 83.6 83.6 82.9 82.9 82.9 82.9 82.9 82.9       Nutrition:  MNT PROTOCOL FOR DIETITIAN  adult custom variable rate parenteral nutrition  DIETARY ORAL SUPPLEMENTS Oral Supplements with tray: Glucerna 1.5; BREAKFAST/LUNCH/DINNER; 1 Can  DIETARY ORAL SUPPLEMENTS Oral Supplements with tray: Ensure Clear-Apple; BREAKFAST/LUNCH/DINNER; 1 Can    Labs:  I have reviewed all lab values.   CBC   Recent Labs      11/02/17   0440  11/03/17   0536  11/04/17   0547   WBC  9.6  9.1  7.8   HGB  9.1*  9.4*  9.3*   HCT  26.5*  26.5*  27.6*   PLTCNT  212  307  241      Diff   Recent Labs      11/02/17   0440   PMNS  81   LYMPHOCYTES  4   MONOCYTES  9   EOSINOPHIL  5   BASOPHILS  1   0.06   PMNABS  7.82*   LYMPHSABS  0.36*   EOSABS  0.50   MONOSABS  0.90        BMP   Recent Labs      11/02/17   0440  11/03/17   0536  11/04/17   0547   SODIUM  120*  124*  122*   POTASSIUM  3.3*  3.3*  4.2   CHLORIDE  87*  89*  92*   CO2  27  28  25   BUN  11  11  14    CREATININE  0.62  0.60  0.70   GLUCOSENF  217*  144*  381*   ANIONGAP  6  7  5    BUNCRRATIO  18  18  20    GFR  >59  >59  >59        Assessment/ Plan:   Active Hospital Problems   (*Primary Problem)    Diagnosis    *Pancreatic cancer (CMS HCC)     [name] is a 71 y.o. female 64 Days Post-Op s/p robotic whipple for pancreatic cancer    POD: 10 Days Post-Op  Antimicrobials: not indicated  Diet/fluids: TPN + dietary oral supplements  Activity: OOB  Pain control: tylenol, norco  DVT ppx: lovenox  Bowel reg/GI ppx: senokot, colace / protonix  Last BM: 11/04/17  PT/OT: ordered  Lines: PICC, JP drain  Dispo: TBD    Plans: continue to advance post-op care, free water restriction for worsening hyponatremia               Endocrine following, appreciate recs for glucose control: humalog 5u TID ac + NPH 15u q8hr + SSIP    Cyrus J. Bertram Denver, MD  11/04/2017 08:17      Addendum:    This is a  late entry for 11/04/17.  I saw and examined this patient 11/04/17.  Please see the resident's note, which I have carefully reviewed, for full details. I agree with the findings and plan of care as documented in the resident's note.  Any additions/exceptions are edited/noted.    Hyperglycemia: working with endocrine to improve control.  Deconditioning: ambulate in hallway; spend time in chair.   Continue TPN.    Floreen Comber, MD  Colorectal Surgery

## 2017-11-05 LAB — CBC
HCT: 28.2 % — ABNORMAL LOW (ref 33.5–45.2)
HGB: 9.4 g/dL — ABNORMAL LOW (ref 11.2–15.2)
MCH: 28.5 pg (ref 27.4–33.0)
MCH: 28.5 pg (ref 27.4–33.0)
MCHC: 33.4 g/dL (ref 32.5–35.8)
MCHC: 33.4 g/dL (ref 32.5–35.8)
MCV: 85.3 fL (ref 78.0–100.0)
MPV: 8.4 fL (ref 7.5–11.5)
PLATELETS: 227 10*3/uL (ref 140–450)
RBC: 3.31 10*6/uL — ABNORMAL LOW (ref 3.63–4.92)
RDW: 17 % — ABNORMAL HIGH (ref 12.0–15.0)
WBC: 7.5 10*3/uL (ref 3.5–11.0)

## 2017-11-05 LAB — POC BLOOD GLUCOSE (RESULTS)
GLUCOSE, POC: 415 mg/dL (ref 70–105)
GLUCOSE, POC: 131 mg/dL — ABNORMAL HIGH (ref 70–105)
GLUCOSE, POC: 227 mg/dL — ABNORMAL HIGH (ref 70–105)
GLUCOSE, POC: 317 mg/dL — ABNORMAL HIGH (ref 70–105)

## 2017-11-05 LAB — BASIC METABOLIC PANEL
ANION GAP: 6 mmol/L (ref 4–13)
BUN/CREA RATIO: 26 — ABNORMAL HIGH (ref 6–22)
BUN: 18 mg/dL (ref 8–25)
CALCIUM: 9 mg/dL (ref 8.5–10.2)
CHLORIDE: 92 mmol/L — ABNORMAL LOW (ref 96–111)
CO2 TOTAL: 24 mmol/L (ref 22–32)
CREATININE: 0.68 mg/dL (ref 0.49–1.10)
ESTIMATED GFR: >59 mL/min/{1.73_m2} (ref >59)
GLUCOSE: 387 mg/dL — ABNORMAL HIGH (ref 65–139)
POTASSIUM: 4.5 mmol/L (ref 3.5–5.1)
SODIUM: 122 mmol/L — ABNORMAL LOW (ref 136–145)

## 2017-11-05 LAB — PHOSPHORUS: PHOSPHORUS: 3.7 mg/dL (ref 2.3–4.0)

## 2017-11-05 LAB — MAGNESIUM: MAGNESIUM: 1.4 mg/dL — ABNORMAL LOW (ref 1.6–2.5)

## 2017-11-05 MED ORDER — INSULIN NPH ISOPHANE U-100 HUMAN 100 UNIT/ML SUBCUTANEOUS SUSPENSION: 15.0000 [IU] | Freq: Every day | SUBCUTANEOUS | Status: DC

## 2017-11-05 MED ORDER — INSULIN NPH ISOPHANE U-100 HUMAN 100 UNIT/ML SUBCUTANEOUS SUSPENSION
20.0000 [IU] | Freq: Every day | SUBCUTANEOUS | Status: DC
  Administered 2017-11-05: 20 [IU] via SUBCUTANEOUS

## 2017-11-05 MED ORDER — INSULIN LISPRO (U-100) 100 UNIT/ML SUBCUTANEOUS SOLUTION
5.0000 [IU] | Freq: Every day | SUBCUTANEOUS | Status: DC
Start: 2017-11-06 — End: 2017-11-06
  Administered 2017-11-06: 5 [IU] via SUBCUTANEOUS

## 2017-11-05 MED ORDER — INSULIN LISPRO (U-100) 100 UNIT/ML SUBCUTANEOUS SOLUTION
3.00 [IU] | Freq: Three times a day (TID) | SUBCUTANEOUS | Status: DC
Start: 2017-11-05 — End: 2017-11-06
  Administered 2017-11-05: 0 [IU] via SUBCUTANEOUS
  Administered 2017-11-05 – 2017-11-06 (×4): 3 [IU] via SUBCUTANEOUS

## 2017-11-05 MED ORDER — MAGNESIUM SULFATE 2 GRAM/50 ML (4 %) IN WATER INTRAVENOUS PIGGYBACK
2.0000 g | INJECTION | Freq: Once | INTRAVENOUS | Status: AC
Start: 2017-11-05 — End: 2017-11-05
  Administered 2017-11-05: 2 g via INTRAVENOUS
  Administered 2017-11-05: 0 g via INTRAVENOUS
  Filled 2017-11-05: qty 50

## 2017-11-05 MED ORDER — TRACE ELEMENTS CR-CU-MN-SE-ZN 10 MCG-1 MG-0.5 MG-60 MCG-5MG/ML IV SOLN
INTRAVENOUS | Status: AC
Start: 2017-11-05 — End: 2017-11-06
  Filled 2017-11-05: qty 550

## 2017-11-05 MED ADMIN — sodium chloride 0.9 % (flush) injection syringe: @ 06:00:00

## 2017-11-05 MED ADMIN — Medication: INTRAVENOUS | @ 20:00:00

## 2017-11-05 MED ADMIN — dextrose 5 % and 0.45 % sodium chloride intravenous solution: INTRAVENOUS | @ 21:00:00 | NDC 00338008504

## 2017-11-05 MED ADMIN — mineral oil-hydrophil petrolat topical ointment: @ 20:00:00 | NDC 09991000553

## 2017-11-05 MED ADMIN — sodium chloride 0.9 % intravenous solution: INTRAVENOUS | @ 21:00:00 | NDC 00338004904

## 2017-11-05 MED ADMIN — sodium chloride 0.9 % (flush) injection syringe: ORAL | @ 11:00:00

## 2017-11-05 NOTE — Consults (Signed)
[facility]  Endocrinology/Diabetes Consult      Encounter Start Date:      10/25/2017  Inpatient Admission Date:  10/25/2017  Name:             [name]  Age:             71 y.o.  Attending:             Nicola Police, MD  PCP             No Pcp    Assessment  1. 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   She is now on TPN which is overnight from 2200 for 12 hrs with 1 can of ensure with clear liquid diet 3 times a day as well as glucerna 1.5 can tid.  This is causing her sugars to go higher in the night and in the morning with lows in the day as she is not eating.    Recommendations:   1. Change NPH to 20U at 2000 only, add humalog 5U at 0500 am  3. Change humalog to 3U premeals with each can of ensure. If possible please change ensure to glucerna.  3. Continue with the present sliding scale.  Orders changed in the system and primary team notified through text page  Thank you for the consult, we will continue to follow. Please page with any questions.        Recent Labs      11/03/17   1107  11/03/17   1711  11/03/17   2112  11/04/17   0552  11/04/17   1149  11/04/17   1449  11/04/17   1523  11/04/17   1737  11/04/17   2115  11/05/17   0623   GLUIP  120*  183*  263*  385*  237*  66*  70  160*  195*  415*     Examination:  Vital Signs:  Temp  Avg: 36.6 C (97.8 F)  Min: 36.1 C (97 F)  Max: 36.9 C (98.4 F)    Pulse  Avg: 78.4  Min: 70  Max: 94 BP  Min: 105/50  Max: 177/66   Resp  Avg: 17.4  Min: 0  Max: 23 SpO2  Avg: 97.6 %  Min: 97 %  Max: 98 %   Pain Score (Numeric, Faces): 2        Alwyn Pea MD  Assistant Professor  Department of Internal Medicine, [facility]  Section of Endocrinology

## 2017-11-05 NOTE — Progress Notes (Signed)
[facility]  Surgical Oncology   Progress Note    Guerrera,Corneshia, 71 y.o. female  Date of Service: 11/05/2017  Date of Birth:  Oct 19, 1946    Hospital Day:  LOS: 11 days     Subjective/Overnight Events:  NAEO. Patient denies n/v. +flatus    Objective:  Temperature: 36.8 C (98.3 F)  Heart Rate: 84  BP (Non-Invasive): (!) 174/64  Respiratory Rate: (!) 23  SpO2-1: 98 %  Pain Score (Numeric, Faces): 6    General:  NAD.  HENT:  NTAC.  Lungs: Breathing unlabored.  Cardiovascular:  RR.  Abdomen:  S, TTP LLQ at incision, ND; no evidence of infection or drainage from wound.  Extremities:  No cyanosis.  Skin:  Skin warm and dry.  Neurologic:  AOx3.    Psychiatric:  Affect Normal.    Labs:    Lab Results Today:    Results for orders placed or performed during the hospital encounter of 10/25/17 (from the past 24 hour(s))   POC BLOOD GLUCOSE (RESULTS)   Result Value Ref Range    GLUCOSE, POC 237 (H) 70 - 105 mg/dL   POC BLOOD GLUCOSE (RESULTS)   Result Value Ref Range    GLUCOSE, POC 66 (L) 70 - 105 mg/dL   POC BLOOD GLUCOSE (RESULTS)   Result Value Ref Range    GLUCOSE, POC 70 70 - 105 mg/dL   POC BLOOD GLUCOSE (RESULTS)   Result Value Ref Range    GLUCOSE, POC 160 (H) 70 - 105 mg/dL   POC BLOOD GLUCOSE (RESULTS)   Result Value Ref Range    GLUCOSE, POC 195 (H) 70 - 105 mg/dL   BASIC METABOLIC PANEL   Result Value Ref Range    SODIUM 122 (L) 136 - 145 mmol/L    POTASSIUM 4.5 3.5 - 5.1 mmol/L    CHLORIDE 92 (L) 96 - 111 mmol/L    CO2 TOTAL 24 22 - 32 mmol/L    ANION GAP 6 4 - 13 mmol/L    CALCIUM 9.0 8.5 - 10.2 mg/dL    GLUCOSE 387 (H) 65 - 139 mg/dL    BUN 18 8 - 25 mg/dL    CREATININE 0.68 0.49 - 1.10 mg/dL    BUN/CREA RATIO 26 (H) 6 - 22    ESTIMATED GFR >59 >59 mL/min/1.14m^2   CBC   Result Value Ref Range    WBC 7.5 3.5 - 11.0 x10^3/uL    RBC 3.31 (L) 3.63 - 4.92 x10^6/uL    HGB 9.4 (L) 11.2 - 15.2 g/dL    HCT 28.2 (L) 33.5 - 45.2 %    MCV 85.3 78.0 - 100.0 fL    MCH 28.5 27.4 - 33.0 pg    MCHC 33.4  32.5 - 35.8 g/dL    RDW 17.0 (H) 12.0 - 15.0 %    PLATELETS 227 140 - 450 x10^3/uL    MPV 8.4 7.5 - 11.5 fL   MAGNESIUM   Result Value Ref Range    MAGNESIUM 1.4 (L) 1.6 - 2.5 mg/dL   PHOSPHORUS   Result Value Ref Range    PHOSPHORUS 3.7 2.3 - 4.0 mg/dL   POC BLOOD GLUCOSE (RESULTS)   Result Value Ref Range    GLUCOSE, POC 415 (HH) 70 - 105 mg/dL       Imaging Studies:  NA    Assessment/Recommendations:  Patient is a 71 yo F, admitted 10/25/2017; HD  LOS: 11 days  with pancreatic cancer who underwent:  robotic whipple on 11/14.     Home Meds:  citalopram, lorazepam, aspirin, cetirizine, furosemide, gabapentin, glipizide, norco, Emla, metformin, singulair, prilosec, zofran, k-dur, copmazine, phenergan    Current Meds: lipitor, citalopram, hydralazine, HCTZ, Humalog, insulin, ativan, metoprolol, hydralazine,     PMHx: DMII, HPLD, GERD, COPD, A fib, HTN  PSHx:   umbilical hernia, port implantation, c-section, tonsillectomy    JP drain output: 450 on 11/04/17, 285 on 11/03/17    ABX:  NA  Diet/fluids:  clear liquid  Activity:  OOB  Pain control:  tylenol, Norco  GI/DVT prophylaxis:  colace, miralax, senna, lovenox  Flatus/last BM:  11/05/17  PT/OT ordered/dispo recs:  Home w/HH  Lines:  port, PICC  Dispo:  TBD    To Do: f/u with endocrine recs for blood glucose elevated to 415 this morning, ctm JP drain output, cont TPN    Cleatrice Burke, MD 11/05/2017, 08:09        Addendum:    This is a late entry for 11/05/17.  I saw and examined this patient 11/05/17.  Please see the resident's note, which I have carefully reviewed, for full details. I agree with the findings and plan of care as documented in the resident's note.  Any additions/exceptions are edited/noted.    Blood sugar continues to be an issue.  Endocrine is consulted to assist with control of  hyperglycemia.    Floreen Comber, MD 11/09/2017 09:02  Colorectal Surgery

## 2017-11-05 NOTE — Care Plan (Signed)
Problem: Patient Care Overview (Adult,OB)  Goal: Plan of Care Review(Adult,OB)  The patient and/or their representative will communicate an understanding of their plan of care   Outcome: Ongoing (see interventions/notes)  Patient rested overnight with no complaints of pain. TPN continued as ordered and documented. Fall precautions maintained. Activity encouraged as tolerated. Fluid restrictions in place.   Goal: Individualization/Patient Specific Goal(Adult/OB)  Outcome: Ongoing (see interventions/notes)      Problem: Perioperative Period (Adult)  Prevent and manage potential problems including:1. bleeding2. gastrointestinal complications3. hypothermia4. infection5. pain6. perioperative injury7. respiratory compromise8. situational response9. urinary retention10. venous [id number]. wound complications   Goal: Signs and Symptoms of Listed Potential Problems Will be Absent or Manageable (Perioperative Period)  Signs and symptoms of listed potential problems will be absent or manageable by discharge/transition of care (reference Perioperative Period (Adult) CPG).   Outcome: Ongoing (see interventions/notes)      Problem: Nutrition, Imbalanced: Inadequate Oral Intake (Adult)  Goal: Improved Oral Intake  Patient will demonstrate the desired outcomes by discharge/transition of care.   Outcome: Ongoing (see interventions/notes)      Problem: Nutrition, Parenteral (Adult)  Prevent and manage potential problems including:  1. fluid/electrolyte imbalance  2. glycemic control impaired  3. infection  4. infusion-related complications  5. malnutrition   Goal: Signs and Symptoms of Listed Potential Problems Will be Absent, Minimized or Managed (Nutrition, Parenteral)  Signs and symptoms of listed potential problems will be absent, minimized or managed by discharge/transition of care (reference Nutrition, Parenteral (Adult) CPG).   Outcome: Ongoing (see interventions/notes)    Goal: Signs and Symptoms of Listed Potential  Problems Will be Absent, Minimized or Managed (Nutrition, Parenteral)  Signs and symptoms of listed potential problems will be absent, minimized or managed by discharge/transition of care (reference Nutrition, Parenteral (Adult) CPG).  Outcome: Ongoing (see interventions/notes)      Problem: Diarrhea (Adult)  Goal: Improved/Reduced Symptoms  Patient will demonstrate the desired outcomes by discharge/transition of care.   Outcome: Ongoing (see interventions/notes)

## 2017-11-06 LAB — CBC WITH DIFF
BASOPHIL #: 0.09 10*3/uL (ref 0.00–0.20)
BASOPHIL %: 1 %
EOSINOPHIL #: 0.7 10*3/uL — ABNORMAL HIGH (ref 0.00–0.50)
EOSINOPHIL %: 7 %
HCT: 26.7 % — ABNORMAL LOW (ref 33.5–45.2)
HGB: 9.2 g/dL — ABNORMAL LOW (ref 11.2–15.2)
LYMPHOCYTE #: 0.41 10*3/uL — ABNORMAL LOW (ref 1.00–4.80)
LYMPHOCYTE #: 0.41 x10ˆ3/uL — ABNORMAL LOW (ref 1.00–4.80)
LYMPHOCYTE %: 4 %
MCH: 29.5 pg (ref 27.4–33.0)
MCHC: 34.6 g/dL (ref 32.5–35.8)
MCV: 85.2 fL (ref 78.0–100.0)
MONOCYTE #: 0.91 x10ˆ3/uL (ref 0.30–1.00)
MONOCYTE %: 9 %
MPV: 8.6 fL (ref 7.5–11.5)
NEUTROPHIL #: 8.27 10*3/uL — ABNORMAL HIGH (ref 1.50–7.70)
NEUTROPHIL %: 80 %
NEUTROPHIL %: 80 %
PLATELETS: 239 x10ˆ3/uL (ref 140–450)
RBC: 3.13 x10ˆ6/uL — ABNORMAL LOW (ref 3.63–4.92)
RDW: 16.7 % — ABNORMAL HIGH (ref 12.0–15.0)
WBC: 10.4 x10ˆ3/uL (ref 3.5–11.0)

## 2017-11-06 LAB — POC BLOOD GLUCOSE (RESULTS)
GLUCOSE, POC: 320 mg/dL — ABNORMAL HIGH (ref 70–105)
GLUCOSE, POC: 345 mg/dL — ABNORMAL HIGH (ref 70–105)
GLUCOSE, POC: 345 mg/dL — ABNORMAL HIGH (ref 70–105)
GLUCOSE, POC: 218 mg/dL — ABNORMAL HIGH (ref 70–105)
GLUCOSE, POC: 137 mg/dL — ABNORMAL HIGH (ref 70–105)
GLUCOSE, POC: 281 mg/dL — ABNORMAL HIGH (ref 70–105)

## 2017-11-06 LAB — HEPATIC FUNCTION PANEL
ALBUMIN: 1.9 g/dL — ABNORMAL LOW (ref 3.4–4.8)
ALKALINE PHOSPHATASE: 78 U/L (ref <150)
ALT (SGPT): 7 U/L (ref <55)
AST (SGOT): 13 U/L (ref 8–41)
PROTEIN TOTAL: 4.7 g/dL — ABNORMAL LOW (ref 6.0–8.0)
PROTEIN TOTAL: 4.7 g/dL — ABNORMAL LOW (ref 6.0–8.0)

## 2017-11-06 LAB — BASIC METABOLIC PANEL
ANION GAP: 6 mmol/L (ref 4–13)
BUN/CREA RATIO: 26 — ABNORMAL HIGH (ref 6–22)
BUN: 18 mg/dL (ref 8–25)
BUN: 18 mg/dL (ref 8–25)
CALCIUM: 8.6 mg/dL (ref 8.5–10.2)
CHLORIDE: 91 mmol/L — ABNORMAL LOW (ref 96–111)
CO2 TOTAL: 23 mmol/L (ref 22–32)
CO2 TOTAL: 23 mmol/L (ref 22–32)
CREATININE: 0.69 mg/dL (ref 0.49–1.10)
ESTIMATED GFR: >59 mL/min/1.73mˆ2 (ref >59)
GLUCOSE: 338 mg/dL — ABNORMAL HIGH (ref 65–139)
POTASSIUM: 4.8 mmol/L (ref 3.5–5.1)
SODIUM: 120 mmol/L — CL (ref 136–145)

## 2017-11-06 LAB — TRIGLYCERIDE: TRIGLYCERIDES: 89 mg/dL (ref <=150)

## 2017-11-06 LAB — PHOSPHORUS: PHOSPHORUS: 3.6 mg/dL (ref 2.3–4.0)

## 2017-11-06 MED ORDER — INSULIN REGULAR HUMAN 100 UNIT/ML INJECTION SSIP
2.00 [IU] | INJECTION | Freq: Four times a day (QID) | SUBCUTANEOUS | Status: DC | PRN
Start: 2017-11-06 — End: 2017-11-16
  Administered 2017-11-06: 6 [IU] via SUBCUTANEOUS
  Administered 2017-11-07: 4 [IU] via SUBCUTANEOUS
  Administered 2017-11-08: 6 [IU] via SUBCUTANEOUS
  Administered 2017-11-09: 4 [IU] via SUBCUTANEOUS
  Administered 2017-11-09: 6 [IU] via SUBCUTANEOUS
  Administered 2017-11-09 – 2017-11-13 (×5): 4 [IU] via SUBCUTANEOUS
  Administered 2017-11-14: 2 [IU] via SUBCUTANEOUS
  Administered 2017-11-14: 4 [IU] via SUBCUTANEOUS
  Administered 2017-11-15: 2 [IU] via SUBCUTANEOUS
  Filled 2017-11-06: qty 3

## 2017-11-06 MED ORDER — INSULIN U-100 REGULAR HUMAN 100 UNIT/ML INJECTION SOLUTION
10.0000 [IU] | Freq: Every day | INTRAMUSCULAR | Status: DC
Start: 2017-11-06 — End: 2017-11-07
  Administered 2017-11-06: 10 [IU] via SUBCUTANEOUS
  Filled 2017-11-06: qty 3

## 2017-11-06 MED ORDER — CALCIUM 200 MG (AS CALCIUM CARBONATE 500 MG) CHEWABLE TABLET
500.0000 mg | CHEWABLE_TABLET | Freq: Every day | ORAL | Status: DC
Start: 2017-11-06 — End: 2017-11-16
  Administered 2017-11-06 – 2017-11-16 (×9): 500 mg via ORAL
  Filled 2017-11-06 (×11): qty 1

## 2017-11-06 MED ORDER — INSULIN U-100 REGULAR HUMAN 100 UNIT/ML INJECTION SOLUTION
3.0000 [IU] | Freq: Three times a day (TID) | INTRAMUSCULAR | Status: DC
Start: 2017-11-07 — End: 2017-11-07
  Administered 2017-11-07 (×3): 3 [IU] via SUBCUTANEOUS

## 2017-11-06 MED ORDER — SIMETHICONE 80 MG CHEWABLE TABLET
80.0000 mg | CHEWABLE_TABLET | Freq: Four times a day (QID) | ORAL | Status: DC | PRN
Start: 2017-11-06 — End: 2017-11-16
  Administered 2017-11-06 – 2017-11-10 (×10): 80 mg via ORAL
  Filled 2017-11-06 (×17): qty 1

## 2017-11-06 MED ORDER — MAGNESIUM SULFATE 2 GRAM/50 ML (4 %) IN WATER INTRAVENOUS PIGGYBACK
2.0000 g | INJECTION | Freq: Once | INTRAVENOUS | Status: AC
Start: 2017-11-06 — End: 2017-11-06
  Administered 2017-11-06: 2 g via INTRAVENOUS
  Administered 2017-11-06: 0 g via INTRAVENOUS
  Filled 2017-11-06: qty 50

## 2017-11-06 MED ORDER — TRACE ELEMENTS CR-CU-MN-SE-ZN 10 MCG-1 MG-0.5 MG-60 MCG-5MG/ML IV SOLN
INTRAVENOUS | Status: AC
Start: 2017-11-06 — End: 2017-11-07
  Filled 2017-11-06: qty 950

## 2017-11-06 MED ORDER — INSULIN NPH ISOPHANE U-100 HUMAN 100 UNIT/ML SUBCUTANEOUS SUSPENSION
20.0000 [IU] | Freq: Every evening | SUBCUTANEOUS | Status: DC
  Administered 2017-11-06: 20 [IU] via SUBCUTANEOUS

## 2017-11-06 MED ADMIN — simethicone 80 mg chewable tablet: ORAL | @ 09:00:00

## 2017-11-06 MED ADMIN — Medication: INTRAVENOUS | @ 21:00:00

## 2017-11-06 MED ADMIN — polyethylene glycol 3350 17 gram oral powder packet: ORAL | @ 09:00:00

## 2017-11-06 MED ADMIN — SODIUM CHLORIDE 0.45 % W/ ADDITIVES: SUBCUTANEOUS | @ 12:00:00 | NDC 00264780200

## 2017-11-06 NOTE — Nurses Notes (Signed)
Patient ambulating to BR with assist x 1 and walker. Patient tolerated well. Patient denied eating breakfast tray, she states she was nauseated this morning.  Encouraged patient to eat lunch tray, currently sitting up in chair. Patient lunch tray documented, 75% and tolerated well.

## 2017-11-06 NOTE — Progress Notes (Signed)
[name], 72 y.o. female  Date of Admission:  10/25/2017  Date of Birth:  Jan 09, 1946  Date of Service:  11/06/2017    Post Op Day: 12 Days Post-Op S/P Procedure(s) (LRB):  ROBOTIC WHIPPLE (N/A)  BLOCK PRE-OP (N/A)  XI ROBOT SUPPLY CARD (N/A)    Chief Complaint:Pancreatic cancer (CMS [place])    Subjective: NAEO. Denies F/C, CP, SOB, N/V. Endorsed some gaseous abd pain with mild distension.    Objective:  Filed Vitals:    11/05/17 2300 11/05/17 2310 11/06/17 0348 11/06/17 0351   BP: (!) 184/60  (!) 157/75    Pulse: 83  93    Resp: (!) 26  17    Temp:  36.8 C (98.2 F)  36.7 C (98.1 F)   SpO2: 93%  (!) 85%      Pain Score (Numeric, Faces): 8    Current Medications:    Current Facility-Administered Medications:  acetaminophen (TYLENOL) tablet 650 mg Oral Q4H PRN   adult custom variable rate parenteral nutrition  Intravenous Continuous   atorvastatin (LIPITOR) tablet 10 mg Oral QPM   calcium carbonate (TUMS) chewable tablet 500 mg Oral Daily   citalopram (CELEXA) tablet 20 mg Oral Daily   docusate sodium (COLACE) capsule 100 mg Oral 2x/day   enoxaparin PF (LOVENOX) 40 mg/0.4 mL SubQ injection 40 mg Subcutaneous Q24H   hydrALAZINE (APRESOLINE) injection 10 mg 10 mg Intravenous Q4H PRN   hydroCHLOROthiazide (HYDRODIURIL) tablet 25 mg Oral Daily   HYDROcodone-acetaminophen (NORCO) 5-325 mg per tablet 1 Tab Oral Q4H PRN   insulin lispro (HUMALOG) 100 units/mL injection 3 Units Subcutaneous 3x/day AC   insulin lispro (HUMALOG) 100 units/mL injection 5 Units Subcutaneous Daily   insulin NPH human 100 units/mL injection 20 Units Subcutaneous Daily   LORazepam (ATIVAN) tablet 0.5 mg Oral Daily PRN   losartan (COZAAR) tablet 100 mg Oral Daily   magnesium sulfate 2 G in SW 50 mL premix IVPB 2 g Intravenous Once   metoprolol succinate (TOPROL-XL) 24 hr extended release tablet 50 mg Oral Daily   NS flush syringe 2 mL  Intracatheter Q8HRS   And      NS flush syringe 2-6 mL Intracatheter Q1 MIN PRN   NS flush syringe 10-30 mL Intracatheter Q8HRS   NS flush syringe 20-30 mL Intracatheter Q1 MIN PRN   ondansetron (ZOFRAN) 2 mg/mL injection 4 mg Intravenous Q6H PRN   pantoprazole (PROTONIX) delayed release tablet 40 mg Oral Daily before Breakfast   polyethylene glycol (MIRALAX) oral packet 17 g Oral Daily   potassium chloride 24meq per 39mL oral liquid 40 mEq Oral Daily with Breakfast   sennosides-docusate sodium (SENOKOT-S) 8.6-50mg  per tablet 1 Tab Oral 2x/day   simethicone (MYLICON) chewable tablet 80 mg Oral Q6H PRN   SSIP insulin lispro (HUMALOG) 100 units/mL injection 2-6 Units Subcutaneous 4x/day PRN       Today's Physical Exam:  Constitutional: Appears in no acute distress  Eyes: Conjunctiva clear. Pupils equal and round. Sclera non-icteric.   ENT: ENMT without erythema or injection, mucous membranes moist.  Neck: Supple, symmetrical, trachea midline  Respiratory: Even and non-labored breathing  Cardiovascular: Regular rate and rhythm  Gastrointestinal: Soft, diffusely-tender, non-distended, incision CDI  Neurologic: Grossly normal, Alert and oriented x3  Psychiatric: Normal    I/O:    Date 11/05/17 0700 - 11/06/17 0659 11/06/17 0700 - 11/07/17 0659   Shift 0700-1459 1500-2259 2300-0659 24 Hour Total 0700-1459 1500-2259 2300-0659 24 Hour Total   I  N  T  A  K  E   P.O. 600 [phone]          Oral 600 [phone]        I.V.  (mL/kg/hr) 165  (0.24) 285  (0.42) 790  (1.17) 1240  (0.61) 225   225      IVPB Volume     50   50      Med (IV) Flush Volume  10 20 30 10   10       Volume (adult custom variable rate parenteral nutrition) 165   165          Volume (adult custom variable rate parenteral nutrition)  [phone] 165   165    Shift Total  (mL/kg) 765  (9.07) 715  (8.48) 1090  (12.93) 2570  (30.49) 225  (2.67)   225  (2.67)   O  U  T  P  U  T   Urine  (mL/kg/hr)              Urine Occurrence 5 x 1 x 2 x 8 x        Emesis               Emesis Occurrence 0 x 0 x 0 x 0 x        Drains 160 210 170 540          Drain Output (Drain (Miscellaneous) JP Right Abdomen) 160 210 170 540        Other              Other  0 x 0 x 0 x        Stool              Stool Occurrence 6 x 0 x 0 x 6 x        Shift Total  (mL/kg) 160  (1.9) 210  (2.49) 170  (2.02) 540  (6.41)       Weight (kg) 84.3 84.3 84.3 84.3 84.3 84.3 84.3 84.3       Nutrition:  MNT PROTOCOL FOR DIETITIAN  DIETARY ORAL SUPPLEMENTS Oral Supplements with tray: Glucerna 1.5; BREAKFAST/LUNCH/DINNER; 1 Can  DIETARY ORAL SUPPLEMENTS Oral Supplements with tray: Ensure Clear-Apple; BREAKFAST/LUNCH/DINNER; 1 Can  adult custom variable rate parenteral nutrition  DIET CLEAR LIQUID    Labs:  I have reviewed all lab values.   CBC   Recent Labs      11/04/17   0547  11/05/17   0409  11/06/17   0416   WBC  7.8  7.5  10.4   HGB  9.3*  9.4*  9.2*   HCT  27.6*  28.2*  26.7*   PLTCNT  241  227  239      Diff   Recent Labs      11/06/17   0416   PMNS  80   LYMPHOCYTES  4   MONOCYTES  9   EOSINOPHIL  7   BASOPHILS  1  0.09   PMNABS  8.27*   LYMPHSABS  0.41*   EOSABS  0.70*   MONOSABS  0.91  BMP   Recent Labs      11/04/17   0547  11/05/17   0409  11/06/17   0416   SODIUM  122*  122*  120*   POTASSIUM  4.2  4.5  4.8   CHLORIDE  92*  92*  91*   CO2  25  24  23    BUN  14  18  18    CREATININE  0.70  0.68  0.69   GLUCOSENF  381*  387*  338*   ANIONGAP  5  6  6    BUNCRRATIO  20  26*  26*   GFR  >59  >59  >59        Assessment/ Plan:   Active Hospital Problems   (*Primary Problem)    Diagnosis   . *Pancreatic cancer (CMS [place])     [name] is a 71 y.o. female 29 Days Post-Op s/p robotic whipple for pancreatic cancer    Home Medications:  Lipitor, celexa, HCTZ, losartan, toprol XL    POD: 12 Days Post-Op  Antimicrobials: not indicated  Diet/fluids: TPN + dietary oral supplements  Activity: OOB  Pain control: tylenol, norco  DVT ppx: lovenox  Bowel reg/GI ppx: senokot, colace / protonix  Last BM:  11/04/17  PT/OT: ordered  Lines: PICC, JP drain  Dispo: TBD    Plans: continue to advance post-op care, free water restriction for worsening hyponatremia               Endocrine following, appreciate recs for glucose control: humalog 3u TID AC + NPH 20u qd + SSIP    Cyrus J. Bertram Denver, MD  11/06/2017 07:50      I saw and examined the patient.  I reviewed the resident's note.  I agree with the findings and plan of care as documented in the resident's note.     Drain output remains chylous.  Discovered the patient has been on supplements containing fat.  Reinforce clear liquid only diet.    Lynwood Dawley, MD

## 2017-11-06 NOTE — Care Management Notes (Signed)
[facility] Management Note    Patient Name: [name]  Date of Birth: September 21, 1946  Sex: female  Date/Time of Admission: 10/25/2017  5:34 AM  Room/Bed: 975/A  Payor: MEDICARE / Plan: MEDICARE PART A AND B / Product Type: Medicare /    LOS: 12 days   PCP: No Pcp    Admitting Diagnosis:  Pancreatic cancer (CMS [place]) [C25.9]    Assessment:      11/06/17 1607   Assessment Details   Assessment Type Continued Assessment   Date of Care Management Update 11/06/17   Date of Next DCP Update 11/09/17   Care Management Plan   Discharge Planning Status plan in progress   Projected Discharge Date 11/09/17   CM will evaluate for rehabilitation potential yes   Patient choice offered to patient/family yes   Form for patient choice reviewed/signed and on chart yes   Facility or [place]    Patient aware of possible cost for ambulance transport?  Yes   Discharge Needs Assessment   Discharge Facility/Level Of Care Needs Home with Home Health (code 6);Home with Infusion Only (code 1)   Transportation Available family or friend will provide       Discharge Plan:  [place] with Infusion  Per service patient continues TPN and will need TPN at home upon discharge.  Patient is at goal for TPN.  CCC met with patient at bedside choice obtained for [facility] and [facility].  Patient currently hyponatremic and endocrine is consulted for today on patient.  Choice placed on chart and referrals placed in [facility].  CCC to follow.     The patient will continue to be evaluated for developing discharge needs.     Case Manager: Frederik Pear, RN  Phone: [phone]

## 2017-11-06 NOTE — Nurses Notes (Signed)
Pt reports "unbearable" abdominal pain after service was around this morning to assess. Pt has been having abdominal pain r/t gas through the night that was slightly relieved after taking a Tum. Pt states that after service palpated her abdomen, her pain increasingly became unbearable. PRN Norco was given. Pt vomited shortly after. PRN Zofran given to help assist with the N/V. Will pass on to day shift RN.

## 2017-11-06 NOTE — Care Plan (Signed)
[place]  Physical Therapy Progress Note      Patient Name: [name]  Date of Birth: [birth year]  Height:  154.9 cm (5\' 1" )  Weight:  84.3 kg (185 lb 13.6 oz)  Room/Bed: 975/A  Payor: MEDICARE / Plan: MEDICARE PART A AND B / Product Type: Medicare /     Assessment:     Patient with limited improvement secondary to complaints of pain. Encouraged increased activity with nursing staff supervision as well as increased PO intake. Will continue to follow for progressive strengthening and mobiltiy training. Continue to recommend dch home with family supervision and HHPT.    Discharge Needs:   Equipment Recommendation: TBD      Discharge Disposition: home with assist, home with home health    JUSTIFICATION OF DISCHARGE RECOMMENDATION   Based on current diagnosis, functional performance prior to admission, and current functional performance, this patient requires continued PT services in home with assist, home with home health in order to achieve significant functional improvements in these deficit areas: aerobic capacity/endurance, gait, locomotion, and balance.      Plan:   Continue to follow patient according to established plan of care.  The risks/benefits of therapy have been discussed with the patient/caregiver and he/she is in agreement with the established plan of care.     Subjective & Objective:        11/06/17 1040   Therapist Pager   PT Assigned/ Pager # sally [phone]   Rehab Session   Document Type therapy note (daily note)   Total PT Minutes: 21   Patient Effort adequate   General Information   Patient/Family Observations patient with poor PO intake, TPN started, complains of gas pain in right abdomen   Mutuality/Individual Preferences   How to Address Anxieties/Fears recommend OOB for meals, ambulation with supervision   Pre Treatment Status   Pre Treatment Patient Status Patient supine in bed   Communication Pre Treatment  Nurse   Cognitive Assessment/Interventions      Behavior/Mood Observations flat affect   Pain Assessment   Pre/Post Treatment Pain Comment reports "gas" pain under right rib cage   Bed Mobility Assessment/Treatment   Supine-Sit Independence minimum assist (75% patient effort)   Sit to Supine, Independence minimum assist (75% patient effort)   Comment  Patient requires assist to rotate shoulders to left to faciliate sup>sit, min assist to bring RLE into bed. Patient able to mobilize laterally in the bed with verbal cues for positioning and increased time to perform task.   Transfer Assessment/Treatment   Sit-Stand Independence  contact guard assist   Stand-Sit Independence  contact guard assist   Sit-Stand-Sit, Assist Device front wheeled walker   Toilet Transfer Independence contact guard assist   Toilet Transfer Assist Device grab bar   Transfer Comment Patient performs movement slowly, CGA for steadying/balance assist. Overall steady on feet with standing and use of FWW.   Gait Assessment/Treatment   Independence  contact guard assist   Assistive Device  rolling walker   Distance in Feet 15x2   Total Distance Ambulated 30   Comment Patient with slow pace, flexed posture, ambulated to bathroom and back to bed. Reports she is unable to ambulate further secondary to her gas pains. Encouraged mobility to stimulate bowel actvity and relieve gas without success.   Therapeutic Exercise/Activity   Comment encouraged OOB for meals, increased activity with nursing staff supervision   Post Treatment Status   Post Treatment Patient Status Patient supine  in bed   Communication [place] With patient   Physical Therapy Clinical Impression   Assessment Patient with limited improvement secondary to complaints of pain. Encouraged increased activity with nursing staff supervision as well as increased PO intake. Will continue to follow for progressive strengthening and mobiltiy training. Continue to recommend dch home  with family supervision and HHPT.   Anticipated Equipment Needs at Discharge (PT Clinical Impression) TBD   Anticipated Discharge Disposition  home with assist;home with home health       Therapist:   Stephani Police, PT   Pager #: [phone]

## 2017-11-06 NOTE — Consults (Signed)
[facility]  Endocrinology/Diabetes Consult      Encounter Start Date:      10/25/2017  Inpatient Admission Date:  10/25/2017  Name:             [name]  Age:             71 y.o.  Attending:             Nicola Police, MD  PCP             No Pcp    Assessment  1. 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   She is now on TPN overnight from 2200 for 12 hrs with 1 can of ensure with clear liquid diet 3 times a day.  Per nursing staff patient has poor oral intake .     Recommendations:   1. Continue  NPH  20U at 2000 only.  2. Switch lispro to regular insulin 3 units TID AC and additional 10 units 30 mins prior to TPN. Ordered   3. Change from lispro SSIP to regular insulin SSIP(conservative). Ordered     Thank you for the consult, we will continue to follow. Please page with any questions.        Recent Labs      11/04/17   1737  11/04/17   2115  11/05/17   0623  11/05/17   1056  11/05/17   1640  11/05/17   2118  11/05/17   2328  11/06/17   0423  11/06/17   1153  11/06/17   1605   GLUIP  160*  195*  415*  131*  227*  320*  317*  345*  218*  137*     Examination:  Vital Signs:  Temp  Avg: 36.7 C (98 F)  Min: 36.3 C (97.3 F)  Max: 36.8 C (98.2 F)    Pulse  Avg: 85.4  Min: 78  Max: 95 BP  Min: 104/53  Max: 184/60   Resp  Avg: 22.8  Min: 17  Max: 27 SpO2  Avg: 92 %  Min: 85 %  Max: 98 %   Pain Score (Numeric, Faces): 4      Fonnie Birkenhead, MD,   Pager# [phone]  Internal Medicine, PGY2.        I have seen this patient and reviewed the chart and examined the patient and agree with the above note. Shela Commons, MD 11/07/2017, 09:18

## 2017-11-07 DIAGNOSIS — E871 Hypo-osmolality and hyponatremia: Secondary | ICD-10-CM

## 2017-11-07 LAB — CBC WITH DIFF
BASOPHIL #: 0.1 10*3/uL (ref 0.00–0.20)
BASOPHIL %: 1 %
EOSINOPHIL #: 0.55 10*3/uL — ABNORMAL HIGH (ref 0.00–0.50)
EOSINOPHIL %: 5 %
HCT: 27 % — ABNORMAL LOW (ref 33.5–45.2)
HGB: 9.1 g/dL — ABNORMAL LOW (ref 11.2–15.2)
LYMPHOCYTE #: 0.55 x10ˆ3/uL — ABNORMAL LOW (ref 1.00–4.80)
LYMPHOCYTE %: 5 %
MCH: 28.6 pg (ref 27.4–33.0)
MCHC: 33.5 g/dL (ref 32.5–35.8)
MCV: 85.4 fL (ref 78.0–100.0)
MCV: 85.4 fL (ref 78.0–100.0)
MONOCYTE #: 1.04 10*3/uL — ABNORMAL HIGH (ref 0.30–1.00)
MONOCYTE %: 10 %
MPV: 8.2 fL (ref 7.5–11.5)
NEUTROPHIL #: 8.61 10*3/uL — ABNORMAL HIGH (ref 1.50–7.70)
NEUTROPHIL %: 79 %
PLATELETS: 268 x10ˆ3/uL (ref 140–450)
RBC: 3.16 10*6/uL — ABNORMAL LOW (ref 3.63–4.92)
RDW: 16.8 % — ABNORMAL HIGH (ref 12.0–15.0)
RDW: 16.8 % — ABNORMAL HIGH (ref 12.0–15.0)
WBC: 10.9 x10ˆ3/uL (ref 3.5–11.0)

## 2017-11-07 LAB — BASIC METABOLIC PANEL
ANION GAP: 4 mmol/L (ref 4–13)
ANION GAP: 4 mmol/L (ref 4–13)
BUN/CREA RATIO: 37 — ABNORMAL HIGH (ref 6–22)
BUN: 23 mg/dL (ref 8–25)
CALCIUM: 8.5 mg/dL (ref 8.5–10.2)
CHLORIDE: 89 mmol/L — ABNORMAL LOW (ref 96–111)
CO2 TOTAL: 25 mmol/L (ref 22–32)
CREATININE: 0.62 mg/dL (ref 0.49–1.10)
ESTIMATED GFR: >59 mL/min/1.73mˆ2 (ref >59)
GLUCOSE: 209 mg/dL — ABNORMAL HIGH (ref 65–139)
POTASSIUM: 4.7 mmol/L (ref 3.5–5.1)
SODIUM: 118 mmol/L — CL (ref 136–145)

## 2017-11-07 LAB — HEPATIC FUNCTION PANEL
ALBUMIN: 1.8 g/dL — ABNORMAL LOW (ref 3.4–4.8)
ALKALINE PHOSPHATASE: 81 U/L (ref <150)
ALT (SGPT): 6 U/L (ref <55)
BILIRUBIN DIRECT: 0.2 mg/dL (ref <0.3)
BILIRUBIN TOTAL: 0.3 mg/dL (ref 0.3–1.3)
BILIRUBIN TOTAL: 0.3 mg/dL (ref 0.3–1.3)
PROTEIN TOTAL: 4.7 g/dL — ABNORMAL LOW (ref 6.0–8.0)

## 2017-11-07 LAB — OSMOLALITY, RANDOM URINE
OSMOLALITY URINE: 474 mOsm/kg (ref 50–1400)
OSMOLALITY URINE: 473 mOsm/kg (ref 50–1400)
OSMOLALITY URINE: 427 mOsm/kg (ref 50–1400)

## 2017-11-07 LAB — POC BLOOD GLUCOSE (RESULTS)
GLUCOSE, POC: 214 mg/dL — ABNORMAL HIGH (ref 70–105)
GLUCOSE, POC: 214 mg/dL — ABNORMAL HIGH (ref 70–105)
GLUCOSE, POC: 213 mg/dL — ABNORMAL HIGH (ref 70–105)
GLUCOSE, POC: 152 mg/dL — ABNORMAL HIGH (ref 70–105)
GLUCOSE, POC: 144 mg/dL — ABNORMAL HIGH (ref 70–105)
GLUCOSE, POC: 114 mg/dL — ABNORMAL HIGH (ref 70–105)

## 2017-11-07 LAB — SODIUM, RANDOM URINE
SODIUM RANDOM URINE: 21 mmol/L
SODIUM RANDOM URINE: 21 mmol/L

## 2017-11-07 LAB — OSMOLALITY
OSMOLALITY, BLOOD: 263 mOsm/kg — ABNORMAL LOW (ref 280–300)
OSMOLALITY, BLOOD: 263 mOsm/kg — ABNORMAL LOW (ref 280–300)

## 2017-11-07 LAB — SODIUM
SODIUM: 119 mmol/L — CL (ref 136–145)
SODIUM: 119 mmol/L — CL (ref 136–145)
SODIUM: 120 mmol/L — CL (ref 136–145)

## 2017-11-07 LAB — PHOSPHORUS
PHOSPHORUS: 3.3 mg/dL (ref 2.3–4.0)
PHOSPHORUS: 3.3 mg/dL (ref 2.3–4.0)

## 2017-11-07 MED ORDER — INSULIN U-100 REGULAR HUMAN 100 UNIT/ML INJECTION SOLUTION
15.0000 [IU] | Freq: Every day | INTRAMUSCULAR | Status: DC
Start: 2017-11-07 — End: 2017-11-09
  Administered 2017-11-07 – 2017-11-08 (×2): 15 [IU] via SUBCUTANEOUS

## 2017-11-07 MED ORDER — INSULIN U-100 REGULAR HUMAN 100 UNIT/ML INJECTION SOLUTION
6.00 [IU] | Freq: Three times a day (TID) | INTRAMUSCULAR | Status: DC
Start: 2017-11-07 — End: 2017-11-13
  Administered 2017-11-07 – 2017-11-12 (×15): 6 [IU] via SUBCUTANEOUS
  Administered 2017-11-13: 0 [IU] via SUBCUTANEOUS
  Administered 2017-11-13 (×2): 6 [IU] via SUBCUTANEOUS

## 2017-11-07 MED ORDER — TRACE ELEMENTS CR-CU-MN-SE-ZN 10 MCG-1 MG-0.5 MG-60 MCG-5MG/ML IV SOLN
INTRAVENOUS | Status: AC
Start: 2017-11-07 — End: 2017-11-08
  Filled 2017-11-07: qty 950

## 2017-11-07 MED ORDER — SODIUM CHLORIDE 0.9 % INTRAVENOUS SOLUTION
INTRAVENOUS | Status: DC
Start: 2017-11-07 — End: 2017-11-08

## 2017-11-07 MED ORDER — INSULIN NPH ISOPHANE U-100 HUMAN 100 UNIT/ML SUBCUTANEOUS SUSPENSION
30.0000 [IU] | Freq: Every evening | SUBCUTANEOUS | Status: DC
  Administered 2017-11-07: 30 [IU] via SUBCUTANEOUS

## 2017-11-07 MED ADMIN — artificial tears with lanolin eye ointment: @ 06:00:00 | NDC 09981000523

## 2017-11-07 MED ADMIN — POTASSIUM CHLORIDE 20 MEQ WITH LIDOCAINE 1% IN NS 100ML IVPB: ORAL | @ 20:00:00 | NDC 00409665318

## 2017-11-07 NOTE — Nurses Notes (Signed)
BP (!) 123/48  Pulse 91  Temp 36.9 C (98.4 F)  Resp 17  Ht 1.549 m (5\' 1" )  Wt 85.9 kg (189 lb 6 oz)  SpO2 98%  BMI 35.78 kg/m2    Patient has scheduled losartan 100mg  and metoprolol 50mg  ER, Surg Onc service paged, order to give both scheduled medications.

## 2017-11-07 NOTE — Nurses Notes (Signed)
Critical lab result--Na: 119.  [name] on service text paged to notify.

## 2017-11-07 NOTE — Care Plan (Signed)
Problem: Patient Care Overview (Adult,OB)  Goal: Plan of Care Review(Adult,OB)  The patient and/or their representative will communicate an understanding of their plan of care   Outcome: Ongoing (see interventions/notes)    Goal: Individualization/Patient Specific Goal(Adult/OB)  Outcome: Ongoing (see interventions/notes)    Goal: Interdisciplinary Rounds/Family Conf  Outcome: Ongoing (see interventions/notes)      Problem: Perioperative Period (Adult)  Prevent and manage potential problems including:1. bleeding2. gastrointestinal complications3. hypothermia4. infection5. pain6. perioperative injury7. respiratory compromise8. situational response9. urinary retention10. venous [id number]. wound complications   Goal: Signs and Symptoms of Listed Potential Problems Will be Absent or Manageable (Perioperative Period)  Signs and symptoms of listed potential problems will be absent or manageable by discharge/transition of care (reference Perioperative Period (Adult) CPG).   Outcome: Ongoing (see interventions/notes)      Problem: Nutrition, Imbalanced: Inadequate Oral Intake (Adult)  Goal: Improved Oral Intake  Patient will demonstrate the desired outcomes by discharge/transition of care.   Outcome: Ongoing (see interventions/notes)    Goal: Prevent Further Weight Loss  Patient will demonstrate the desired outcomes by discharge/transition of care.   Outcome: Ongoing (see interventions/notes)      Problem: Nutrition, Parenteral (Adult)  Prevent and manage potential problems including:  1. fluid/electrolyte imbalance  2. glycemic control impaired  3. infection  4. infusion-related complications  5. malnutrition   Goal: Signs and Symptoms of Listed Potential Problems Will be Absent, Minimized or Managed (Nutrition, Parenteral)  Signs and symptoms of listed potential problems will be absent, minimized or managed by discharge/transition of care (reference Nutrition, Parenteral (Adult) CPG).   Outcome: Ongoing (see  interventions/notes)      Problem: Diarrhea (Adult)  Goal: Improved/Reduced Symptoms  Patient will demonstrate the desired outcomes by discharge/transition of care.   Outcome: Ongoing (see interventions/notes)      Comments: POC reviewed with patient at bedside.  TPN to continue this evening.  Glucose monitored and insulin given per MAR. Patient complaining of upper RQ pain--simethicone order to be placed by service.  Na levels drawn q4 hours. Remains on 1,000 ml fluid restriction.

## 2017-11-07 NOTE — Nurses Notes (Addendum)
November 07, 2017    2343: Haroldine Laws notified me that patient has a Critical Result; Sodium is 120.     2345: Text page sent to Dr. Josetta Huddle notifying that Critical Result: Sodium is 120. Will continue to monitor.      November 08, 2017    0319: Haroldine Laws notified me that patient has a Critical Result; Sodium is 120.     0320: Text page sent to Dr. Josetta Huddle notifying that Critical Result: Sodium is 120. Will continue to monitor.    3149: Dow Adolph notified me that patient has a Critical Result; Sodium is 119.     0529: Text page sent to Dr. Josetta Huddle notifying that Critical Result: Sodium is 119. Will continue to monitor.

## 2017-11-07 NOTE — Care Management Notes (Signed)
[place] met with [facility], [place], on patient home TPN, patient approval is pending with additional notes needing placed on why patient does not have permanent tube placed, failure on tube feeds prior, and additional info on patient chyle leak.  CCC referred to Dalbert Garnet, PA with surgical oncology.  CCC to follow.

## 2017-11-07 NOTE — Progress Notes (Signed)
Endocrine:    Recent Labs      11/05/17   1640  11/05/17   2118  11/05/17   2328  11/06/17   0423  11/06/17   1153  11/06/17   1605  11/06/17   2030  11/07/17   0546  11/07/17   1037   GLUIP  227*  320*  317*  345*  218*  137*  281*  214*  213*       Pt yet receiving parental nutrition Overnight.  BP (!) 155/43  Pulse 85  Temp 36.6 C (97.8 F)  Resp 18  Ht 1.549 m (5\' 1" )  Wt 85.9 kg (189 lb 6 oz)  SpO2 98%  BMI 35.78 kg/m2    Awake but evidently ill  A:  Hyperglycemia  P:  Increase PM NPH to 30 units  Increase PM regular to 15 units   Increase meal time Regular to 6 units   Will text page service  Shela Commons, MD  11/07/2017, 13:20

## 2017-11-07 NOTE — Nurses Notes (Signed)
Pt alert and oriented. Pt denies difficulty breathing. Patient with report of nausea and has report of pain. Patient medicated, see eMar.  Assessment per flowsheet. Will continue to monitor.

## 2017-11-07 NOTE — Consults (Signed)
Nephrology Consult   Initial Note      Service: Surg Onc  Requesting MD: Dr. Cyndi Bender  Date of Service:  11/07/2017    Assessment:  [name] is a 71 y.o. female with hx of pancreatic cancer s/p robotic Whipple on 11/14 with hyponatremia    Hyponatremia   - likely due to thiazide diuretic + low solute intake post surgery. 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 Urine studies more suggestive of volume depletion given urine Na 20.    Recommendations:  - Agree with d/c hctz  - Start NS @ 50 ml/hr. Check Na, urine Na, and urine Osm q4h.  - Consider d/c SSRI  - Agree with decreasing free water in parenteral nutrition  - Thank you for obtaining urine studies  - Please call if sodium increases or decreases by more than 6 within a 24 hour period    Thank you for consulting Nephrology. Please call with any questions or concerns. We will continue to follow.    Information Obtained from: patient  Reason for Consult:  Hyponatremia    HPI/Discussion:  [name] is a 71 y.o. female with hx of pancreatic adenocarcinoma admitted for whipple surgery. She had her surgery on 11/14. Her sodium on admission was 135 and has gradually trended down to 118 this morning. Sodium began to decrease following surgery. She has been on HCTZ during the admission. She complains of nausea, vomiting, and abdominal pain. She is receiving parenteral nutrition and a clear liquid diet.     Past Medical History:   Diagnosis Date   . Abdominal hernia 10/18/2017    hx of repair   . Anxiety    . Arthritis    . Asthma    . Atrial fibrillation (CMS HCC)    . Back problem    . Bruises easily    . Cancer (CMS [place]) 10/18/2017    chemo and radiation completed 09/24/2017   . COPD (chronic obstructive pulmonary disease) (CMS HCC)    . CPAP (continuous positive airway pressure) dependence 10/18/2017    has not used recently   . Depression    . DM (diabetes mellitus) (CMS HCC) 2000    Fasting BG 200's. HGA1C 2018 6   . Edema    .  Essential hypertension    . GERD (gastroesophageal reflux disease)    . Headache    . Heartburn    . Hx antineoplastic chemo 2018   . Hx of radiation therapy 2018   . Hypercholesteremia    . Hyperlipidemia    . Migraine 10/18/2017    none recently   . Obesity    . Palpitations    . Pancreatic cancer (CMS [place]) 03/2017   . Panic attack    . Peripheral neuropathy 10/18/2017    knees down, bilateral   . Sleep apnea    . Type 2 diabetes mellitus (CMS HCC) 10/18/2017    Dx 2000 FBS 200s   . Unintentional weight loss 10/18/2017    25 lbs 03/2017   . Wears glasses        Past Surgical History:   Procedure Laterality Date   . CESAREAN SECTION     . HX HERNIA REPAIR     . HX SUBCLAVIAN PORT IMPLANTION      s/p removed   . HX SUBCLAVIAN PORT IMPLANTION     . HX TONSIL AND ADENOIDECTOMY     . HX TONSILLECTOMY     . UMBILICAL  HERNIA REPAIR      x2           Medications Prior to Admission     Prescriptions    aspirin (ECOTRIN) 81 mg Oral Tablet, Delayed Release (E.C.)    Take 81 mg by mouth    atorvastatin (LIPITOR) 10 mg Oral Tablet    Take 10 mg by mouth    cetirizine (ZYRTEC) 10 mg Oral Tablet    Take 10 mg by mouth    citalopram (CELEXA) 20 mg Oral Tablet    Take 20 mg by mouth    furosemide (LASIX) 20 mg Oral Tablet    20 mg Once a day     gabapentin (NEURONTIN) 300 mg Oral Capsule    300 mg Once a day     glipiZIDE (GLUCOTROL XL) 10 mg Oral Tablet Extended Rel 24 hr (2)    Take 10 mg by mouth    hydroCHLOROthiazide (HYDRODIURIL) 25 mg Oral Tablet    25 mg Once a day     HYDROcodone-acetaminophen (NORCO) 5-325 mg Oral Tablet    Take 1 Tab by mouth Every 4 hours as needed for Pain    lidocaine-prilocaine (EMLA) 2.5-2.5 % Cream    LORazepam (ATIVAN) 0.5 mg Oral Tablet    0.5 mg Once a day     losartan (COZAAR) 100 mg Oral Tablet    Take 100 mg by mouth Once a day     MetFORMIN (GLUCOPHAGE) 1,000 mg Oral Tablet    1,000 mg Twice daily with food     metoprolol succinate (TOPROL-XL) 50 mg Oral Tablet Sustained Release 24 hr    50  mg Once a day     montelukast (SINGULAIR) 10 mg Oral Tablet    Take 10 mg by mouth    omeprazole (PRILOSEC) 20 mg Oral Capsule, Delayed Release(E.C.)    20 mg Once a day     ondansetron (ZOFRAN) 8 mg Oral Tablet    Take 8 mg by mouth Every 8 hours as needed     potassium chloride (K-DUR) 20 mEq Oral Tab Sust.Rel. Particle/Crystal    Take 20 mEq by mouth    prochlorperazine (COMPAZINE) 10 mg Oral Tablet    Take 10 mg by mouth    promethazine (PHENERGAN) 25 mg Oral Tablet    Take 25 mg by mouth          Inpatient Medications:    Current Facility-Administered Medications:  acetaminophen (TYLENOL) tablet 650 mg Oral Q4H PRN   adult custom variable rate parenteral nutrition  Intravenous Continuous   adult custom variable rate parenteral nutrition  Intravenous Continuous   atorvastatin (LIPITOR) tablet 10 mg Oral QPM   calcium carbonate (TUMS) chewable tablet 500 mg Oral Daily   citalopram (CELEXA) tablet 20 mg Oral Daily   docusate sodium (COLACE) capsule 100 mg Oral 2x/day   enoxaparin PF (LOVENOX) 40 mg/0.4 mL SubQ injection 40 mg Subcutaneous Q24H   hydrALAZINE (APRESOLINE) injection 10 mg 10 mg Intravenous Q4H PRN   HYDROcodone-acetaminophen (NORCO) 5-325 mg per tablet 1 Tab Oral Q4H PRN   insulin NPH human 100 units/mL injection 20 Units Subcutaneous QPM   insulin R human 100 units/mL injection 10 Units Subcutaneous Daily   insulin R human 100 units/mL injection 3 Units Subcutaneous 3x/day AC   LORazepam (ATIVAN) tablet 0.5 mg Oral Daily PRN   losartan (COZAAR) tablet 100 mg Oral Daily   metoprolol succinate (TOPROL-XL) 24 hr extended release tablet 50 mg Oral Daily  NS flush syringe 2 mL Intracatheter Q8HRS   And      NS flush syringe 2-6 mL Intracatheter Q1 MIN PRN   NS flush syringe 10-30 mL Intracatheter Q8HRS   NS flush syringe 20-30 mL Intracatheter Q1 MIN PRN   NS premix infusion  Intravenous Continuous   ondansetron (ZOFRAN) 2 mg/mL injection 4 mg Intravenous Q6H PRN   pantoprazole (PROTONIX) delayed  release tablet 40 mg Oral Daily before Breakfast   polyethylene glycol (MIRALAX) oral packet 17 g Oral Daily   potassium chloride 35meq per 60mL oral liquid 40 mEq Oral Daily with Breakfast   sennosides-docusate sodium (SENOKOT-S) 8.6-50mg  per tablet 1 Tab Oral 2x/day   simethicone (MYLICON) chewable tablet 80 mg Oral Q6H PRN   SSIP insulin R human (HUMULIN R) 100 units/mL injection 2-6 Units Subcutaneous 4x/day PRN     Allergies   Allergen Reactions   . Sulfa (Sulfonamides) Itching       Family History  Family Medical History:     None          Social History  Social History   Substance Use Topics   . Smoking status: Former Smoker     Packs/day: 0.50     Years: 20.00     Quit date: 10/18/1993   . Smokeless tobacco: Never Used   . Alcohol use No        Review of Systems (Negative unless bolded)  CONSTITUTIONAL: fever, chills, fatigue, weight loss/gain  HEENT: headache, sore throat  CARDIOVASCULAR: chest pain, palpitations, edema  RESPIRATORY: shortness of breath, cough, wheezing  GI: nausea, vomiting, diarrhea, abdominal pain  GU: dysuria, hematuria  MUSCULOSKELETAL: joint pain, muscle pain  SKIN: rash, dryness  PSYCH: depression, anxiety  NEURO: weakness, paresthesias  ENDOCRINE: polyuria, polydipsia  HEME: history of clots, easy bleeding/bruising  ALLERGY: history of anaphylaxis    Physical Exam  Most Recent Vitals       Admission (Current) from 10/25/2017 in [facility]    Temperature 36.6 C (97.8 F) filed at... 11/07/2017 1115    Heart Rate 85 filed at... 11/07/2017 1115    Respiratory Rate 18 filed at... 11/07/2017 1115    BP (Non-Invasive) (!)  155/43 filed at... 11/07/2017 1115    Height 1.549 m (5\' 1" ) filed at... 10/25/2017 0658    Weight 85.9 kg (189 lb 6 oz) filed at... 11/07/2017 0739    BMI (Calculated) 35.86 filed at... 11/07/2017 0739    BSA (Calculated) 1.9 filed at... 10/25/2017 0658      General: No apparent distress  Head:  Normocephalic and atraumatic  Eyes:  PERRLA, conjunctiva clear   Mouth:  Moist mucous membranes  Neck:  Supple, trachea midline, no JVD  Lungs:  CTAB, no wheezing  Heart:  RRR, no murmurs  Abdomen: Soft, LLQ TTP  Extremities: No cyanosis, no edema  Skin:  Warm and dry  Neurologic: No focal deficits, Alert  Psychiatric: Normal mood and affect       Labs:  I have reviewed all lab results.  CBC   Recent Labs      11/05/17   0409  11/06/17   0416  11/07/17   0514   WBC  7.5  10.4  10.9   HGB  9.4*  9.2*  9.1*   HCT  28.2*  26.7*  27.0*   PLTCNT  227  239  268      Diff   Recent Labs      11/06/17  0416  11/07/17   0514   PMNS  80  79   LYMPHOCYTES  4  5   MONOCYTES  9  10   EOSINOPHIL  7  5   BASOPHILS  1  0.09  1  0.10   PMNABS  8.27*  8.61*   LYMPHSABS  0.41*  0.55*   EOSABS  0.70*  0.55*   MONOSABS  0.91  1.04*        BMP   Recent Labs      11/05/17   0409  11/06/17   0416  11/07/17   0514   SODIUM  122*  120*  118*   POTASSIUM  4.5  4.8  4.7   CHLORIDE  92*  91*  89*   CO2  24  23  25    BUN  18  18  23    CREATININE  0.68  0.69  0.62   GLUCOSENF  387*  338*  209*   ANIONGAP  6  6  4    BUNCRRATIO  26*  26*  37*   GFR  >59  >59  >59        Urinalysis   No results for input(s): CHARACTER, COLOR, SPECGRAVUR, PHURINE, PROTEIN, GLUCOSE, KETONES, UROBILINOGEN, LEUKOCYTES, NITRITE, EPITHCEL, BACTERIA, MUCOUS, AMORPCRY, CALCIUMUR, CASTS, BILIRUBIN, BLOOD, SQUAEPIT in the last 72 hours.      Blood Gas Studies   No results for input(s): SPECIMENTYPE, FI02, PH, PCO2, PO2, BICARBONATE, BASEEXCESS, BASEDEFICIT, PFRATIO in the last 72 hours.       Imaging Studies:  I have reviewed all imaging.        Jennet Maduro, MD 11/07/2017, 12:54  PGY-5  Fellow - Nephrology  [place]            Late entry for 11/07/2017  I saw and examined the patient, personally performed the critical or key portions of the service and discussed patient management with the Nephrology team including the resident,fellow and ancillary staff.  Patient was examined during morning rounds.  I reviewed Dr. Andree Elk  note and agree with the history, findings and plan of care.  Exceptions are edited/noted above.  Laboratory, hemodynamic and radiological data were also reviewed and discussed.    Nathaniel Man, MD  Assistant Professor  Section of Nephrology  Section of [place], [place] 11/09/2017, 12:24

## 2017-11-07 NOTE — Nurses Notes (Signed)
Patient educated on call bell system and states understanding. Patient educated to "call, not fall" and states understanding. Patient's questions and concerns acknowledged and answered at this time. Patient complained of continuous aching right upper abdominal pain rated 10/10 that is alleviated by medications; PRN Norco given per [facility]. Patient assessments completed per flowsheet. Medications given per MAR. Patient stable at this time. Will continue to monitor and will update MD as needed.

## 2017-11-07 NOTE — Progress Notes (Signed)
[place], 71 y.o. female  Date of Admission:  10/25/2017  Date of Birth:  Jun 14, 1946  Date of Service:  11/07/2017    Post Op Day: 13 Days Post-Op S/P Procedure(s) (LRB):  ROBOTIC WHIPPLE (N/A)  BLOCK PRE-OP (N/A)  XI ROBOT SUPPLY CARD (N/A)    Chief Complaint:Pancreatic cancer (CMS [place])    Subjective: NAEO. Denies F/C, CP, SOB, N/V. Feeling better this morning than yesterday.    Objective:  Filed Vitals:    11/06/17 1853 11/06/17 1929 11/07/17 0042 11/07/17 0326   BP:  (!) 168/65 (!) 142/42 (!) 155/44   Pulse:  83 92 89   Resp:  17     Temp: 37.1 C (98.8 F)  36.9 C (98.4 F) 36.9 C (98.4 F)   SpO2:  98% 97% 97%     Pain Score (Numeric, Faces): 0    Current Medications:    Current Facility-Administered Medications:  acetaminophen (TYLENOL) tablet 650 mg Oral Q4H PRN   adult custom variable rate parenteral nutrition  Intravenous Continuous   atorvastatin (LIPITOR) tablet 10 mg Oral QPM   calcium carbonate (TUMS) chewable tablet 500 mg Oral Daily   citalopram (CELEXA) tablet 20 mg Oral Daily   docusate sodium (COLACE) capsule 100 mg Oral 2x/day   enoxaparin PF (LOVENOX) 40 mg/0.4 mL SubQ injection 40 mg Subcutaneous Q24H   hydrALAZINE (APRESOLINE) injection 10 mg 10 mg Intravenous Q4H PRN   HYDROcodone-acetaminophen (NORCO) 5-325 mg per tablet 1 Tab Oral Q4H PRN   insulin NPH human 100 units/mL injection 20 Units Subcutaneous QPM   insulin R human 100 units/mL injection 10 Units Subcutaneous Daily   insulin R human 100 units/mL injection 3 Units Subcutaneous 3x/day AC   LORazepam (ATIVAN) tablet 0.5 mg Oral Daily PRN   losartan (COZAAR) tablet 100 mg Oral Daily   metoprolol succinate (TOPROL-XL) 24 hr extended release tablet 50 mg Oral Daily   NS flush syringe 2 mL Intracatheter Q8HRS   And      NS flush syringe 2-6 mL Intracatheter Q1 MIN PRN   NS flush syringe 10-30 mL Intracatheter Q8HRS   NS flush syringe  20-30 mL Intracatheter Q1 MIN PRN   ondansetron (ZOFRAN) 2 mg/mL injection 4 mg Intravenous Q6H PRN   pantoprazole (PROTONIX) delayed release tablet 40 mg Oral Daily before Breakfast   polyethylene glycol (MIRALAX) oral packet 17 g Oral Daily   potassium chloride 65meq per 41mL oral liquid 40 mEq Oral Daily with Breakfast   sennosides-docusate sodium (SENOKOT-S) 8.6-50mg  per tablet 1 Tab Oral 2x/day   simethicone (MYLICON) chewable tablet 80 mg Oral Q6H PRN   SSIP insulin R human (HUMULIN R) 100 units/mL injection 2-6 Units Subcutaneous 4x/day PRN       Today's Physical Exam:  Constitutional: Appears in no acute distress  Eyes: Conjunctiva clear. Pupils equal and round. Sclera non-icteric.   ENT: ENMT without erythema or injection, mucous membranes moist.  Neck: Supple, symmetrical, trachea midline  Respiratory: Even and non-labored breathing  Cardiovascular: Regular rate and rhythm  Gastrointestinal: Soft, diffusely-tender, non-distended, incisions CDI  Neurologic: Grossly normal, Alert and oriented x3  Psychiatric: Normal    I/O:    Date 11/06/17 0700 - 11/07/17 9379  11/07/17 0700 - 11/08/17 0659   Shift 0700-1459 1500-2259 2300-0659 24 Hour Total 0700-1459 1500-2259 2300-0659 24 Hour Total   I  N  T  A  K  E   P.O. 440 450 0 890          Oral 440 450 0 890        I.V.  (mL/kg/hr) 225  (0.33) 345  (0.51) 1120  (1.66) 1690  (0.84) 65   65      IVPB Volume 50   50          Med (IV) Flush Volume 10   10          Volume (adult custom variable rate parenteral nutrition) 165   165          Volume (adult custom variable rate parenteral nutrition)  345 1120 1465 65   65    Shift Total  (mL/kg) 665  (7.89) 795  (9.43) 1120  (13.29) 2580  (30.61) 65  (0.76)   65  (0.76)   O  U  T  P  U  T   Urine  (mL/kg/hr) 200  (0.3) 700  (1.04) 400  (0.59) 1300  (0.64)          Urine (Voided) 200 [phone]          Urine Occurrence 2 x 1 x 0 x 3 x        Emesis              Emesis Occurrence 0 x 0 x 0 x 0 x        Drains 170 340 145  655          Drain Output (Drain (Miscellaneous) JP Right Abdomen) 170 340 145 655        Other              Other   0 x 0 x        Stool              Stool Occurrence 0 x 0 x 0 x 0 x        Shift Total  (mL/kg) 370  (4.39) 1040  (12.34) 545  (6.47) 1955  (23.19)       Weight (kg) 84.3 84.3 84.3 84.3 85.9 85.9 85.9 85.9       Nutrition:  MNT PROTOCOL FOR DIETITIAN  DIETARY ORAL SUPPLEMENTS Oral Supplements with tray: Ensure Clear-Apple; BREAKFAST/LUNCH/DINNER; 1 Can  DIET CLEAR LIQUID  adult custom variable rate parenteral nutrition    Labs:  I have reviewed all lab values.   CBC   Recent Labs      11/05/17   0409  11/06/17   0416  11/07/17   0514   WBC  7.5  10.4  10.9   HGB  9.4*  9.2*  9.1*   HCT  28.2*  26.7*  27.0*   PLTCNT  227  239  268      Diff   Recent Labs      11/06/17   0416  11/07/17   0514   PMNS  80  79   LYMPHOCYTES  4  5   MONOCYTES  9  10   EOSINOPHIL  7  5   BASOPHILS  1  0.09  1  0.10   PMNABS  8.27*  8.61*   LYMPHSABS  0.41*  0.55*   EOSABS  0.70*  0.55*  MONOSABS  0.91  1.04*        BMP   Recent Labs      11/05/17   0409  11/06/17   0416  11/07/17   0514   SODIUM  122*  120*  118*   POTASSIUM  4.5  4.8  4.7   CHLORIDE  92*  91*  89*   CO2  24  23  25    BUN  18  18  23    CREATININE  0.68  0.69  0.62   GLUCOSENF  387*  338*  209*   ANIONGAP  6  6  4    BUNCRRATIO  26*  26*  37*   GFR  >59  >59  >59        Assessment/ Plan:   Active Hospital Problems   (*Primary Problem)    Diagnosis   . *Pancreatic cancer (CMS [place])     Camarie Mctigue is a 71 y.o. female 61 Days Post-Op s/p robotic whipple for pancreatic cancer    Home Medications:  Lipitor, celexa, HCTZ, losartan, toprol XL    POD: 13 Days Post-Op  Antimicrobials: not indicated  Diet/fluids: TPN + dietary oral supplements  Activity: OOB  Pain control: tylenol, norco  DVT ppx: lovenox  Bowel reg/GI ppx: senokot, colace / protonix  Last BM: 11/05/17  PT/OT: home w/ HH  Lines: PICC, JP drain  Dispo: TBD    Plans: Worsening hyponatremia: ordered  urine osm, plasma osm, dc HCTZ, nephro will see patient               Endocrine following, appreciate recs for glucose control   CM consult for TPN for home.  Patient has a chyle leak that has not resolved since surgery.  Patient was given trials of clear liquids, and the chyle leak started to resolve.  Patient was given supplemental nutrition with Glucerna (which contains fat) and the chyle drainage returned.  Therefore, patient is unable to have enteral feeds beyond clear liquids.  She will be reliant on TPN for nutrition for at least 30 days, possibly beyond 90 days if the chyle leak persists.      Cyrus J. Bertram Denver, MD  11/07/2017 08:05      I saw and examined the patient.  I reviewed the resident's note.  I agree with the findings and plan of care as documented in the resident's note.        Continue TPN.   worsening hyponatremia, consult Renal.  Appreciate recs.  Continue to work with Endocrine on blood sugar control.   PT cleared patient for home discharge.  Will begin to set up home care    Lynwood Dawley, MD

## 2017-11-08 LAB — POC BLOOD GLUCOSE (RESULTS)
GLUCOSE, POC: 313 mg/dL — ABNORMAL HIGH (ref 70–105)
GLUCOSE, POC: 98 mg/dL (ref 70–105)
GLUCOSE, POC: 133 mg/dL — ABNORMAL HIGH (ref 70–105)
GLUCOSE, POC: 127 mg/dL — ABNORMAL HIGH (ref 70–105)
GLUCOSE, POC: 80 mg/dL (ref 70–105)

## 2017-11-08 LAB — CBC WITH DIFF
BASOPHIL #: 0.04 10*3/uL (ref 0.00–0.20)
BASOPHIL %: 0 %
EOSINOPHIL #: 0.19 10*3/uL (ref 0.00–0.50)
EOSINOPHIL #: 0.19 10*3/uL (ref 0.00–0.50)
EOSINOPHIL %: 2 %
HCT: 24.4 % — ABNORMAL LOW (ref 33.5–45.2)
HGB: 8.2 g/dL — ABNORMAL LOW (ref 11.2–15.2)
LYMPHOCYTE #: 0.43 10*3/uL — ABNORMAL LOW (ref 1.00–4.80)
LYMPHOCYTE %: 4 %
MCH: 28.8 pg (ref 27.4–33.0)
MCHC: 33.7 g/dL (ref 32.5–35.8)
MCHC: 33.7 g/dL (ref 32.5–35.8)
MCV: 85.3 fL (ref 78.0–100.0)
MONOCYTE #: 0.84 10*3/uL (ref 0.30–1.00)
MONOCYTE %: 8 %
MPV: 8 fL (ref 7.5–11.5)
NEUTROPHIL #: 9.22 10*3/uL — ABNORMAL HIGH (ref 1.50–7.70)
NEUTROPHIL %: 86 %
PLATELETS: 220 10*3/uL (ref 140–450)
RBC: 2.86 10*6/uL — ABNORMAL LOW (ref 3.63–4.92)
RDW: 16.1 % — ABNORMAL HIGH (ref 12.0–15.0)
WBC: 10.7 10*3/uL (ref 3.5–11.0)

## 2017-11-08 LAB — BASIC METABOLIC PANEL
ANION GAP: 2 mmol/L — ABNORMAL LOW (ref 4–13)
BUN/CREA RATIO: 38 — ABNORMAL HIGH (ref 6–22)
BUN: 23 mg/dL (ref 8–25)
CALCIUM: 8.5 mg/dL (ref 8.5–10.2)
CALCIUM: 8.5 mg/dL (ref 8.5–10.2)
CHLORIDE: 93 mmol/L — ABNORMAL LOW (ref 96–111)
CO2 TOTAL: 24 mmol/L (ref 22–32)
CREATININE: 0.61 mg/dL (ref 0.49–1.10)
ESTIMATED GFR: >59 mL/min/{1.73_m2} (ref >59)
ESTIMATED GFR: >59 mL/min/{1.73_m2} (ref >59)
GLUCOSE: 248 mg/dL — ABNORMAL HIGH (ref 65–139)
POTASSIUM: 5.1 mmol/L (ref 3.5–5.1)
SODIUM: 119 mmol/L — CL (ref 136–145)

## 2017-11-08 LAB — SODIUM, RANDOM URINE
SODIUM RANDOM URINE: <20 mmol/L
SODIUM RANDOM URINE: 42 mmol/L
SODIUM RANDOM URINE: <20 mmol/L

## 2017-11-08 LAB — SODIUM
SODIUM: 120 mmol/L — CL (ref 136–145)
SODIUM: 121 mmol/L — ABNORMAL LOW (ref 136–145)
SODIUM: 123 mmol/L — ABNORMAL LOW (ref 136–145)
SODIUM: 124 mmol/L — ABNORMAL LOW (ref 136–145)
SODIUM: 122 mmol/L — ABNORMAL LOW (ref 136–145)
SODIUM: 125 mmol/L — ABNORMAL LOW (ref 136–145)

## 2017-11-08 LAB — HEPATIC FUNCTION PANEL
ALBUMIN: 1.7 g/dL — ABNORMAL LOW (ref 3.4–4.8)
ALKALINE PHOSPHATASE: 82 U/L (ref <150)
ALT (SGPT): 8 U/L (ref <55)
BILIRUBIN DIRECT: 0.1 mg/dL (ref <0.3)
BILIRUBIN TOTAL: 0.2 mg/dL — ABNORMAL LOW (ref 0.3–1.3)
PROTEIN TOTAL: 4.4 g/dL — ABNORMAL LOW (ref 6.0–8.0)
PROTEIN TOTAL: 4.4 g/dL — ABNORMAL LOW (ref 6.0–8.0)

## 2017-11-08 LAB — OSMOLALITY, RANDOM URINE
OSMOLALITY URINE: 249 mOsm/kg (ref 50–1400)
OSMOLALITY URINE: 539 mOsm/kg (ref 50–1400)
OSMOLALITY URINE: 285 mOsm/kg (ref 50–1400)

## 2017-11-08 LAB — PHOSPHORUS: PHOSPHORUS: 3.6 mg/dL (ref 2.3–4.0)

## 2017-11-08 MED ORDER — SODIUM CHLORIDE 0.9 % INTRAVENOUS SOLUTION
INTRAVENOUS | Status: DC
Start: 2017-11-08 — End: 2017-11-08

## 2017-11-08 MED ORDER — TRACE ELEMENTS CR-CU-MN-SE-ZN 10 MCG-1 MG-0.5 MG-60 MCG-5MG/ML IV SOLN
INTRAVENOUS | Status: AC
Start: 2017-11-08 — End: 2017-11-09
  Filled 2017-11-08: qty 950

## 2017-11-08 MED ORDER — SODIUM CHLORIDE 0.9 % INTRAVENOUS SOLUTION
INTRAVENOUS | Status: DC
Start: 2017-11-08 — End: 2017-11-09

## 2017-11-08 MED ORDER — MAGNESIUM SULFATE 2 GRAM/50 ML (4 %) IN WATER INTRAVENOUS PIGGYBACK
2.0000 g | INJECTION | Freq: Once | INTRAVENOUS | Status: AC
Start: 2017-11-08 — End: 2017-11-08
  Administered 2017-11-08: 2 g via INTRAVENOUS
  Administered 2017-11-08: 0 g via INTRAVENOUS
  Filled 2017-11-08 (×2): qty 50

## 2017-11-08 MED ORDER — INSULIN NPH ISOPHANE U-100 HUMAN 100 UNIT/ML SUBCUTANEOUS SUSPENSION
40.0000 [IU] | Freq: Every evening | SUBCUTANEOUS | Status: DC
  Administered 2017-11-08: 40 [IU] via SUBCUTANEOUS

## 2017-11-08 MED ADMIN — sodium hypochlorite 0.0125 % topical solution: ORAL | @ 16:00:00 | NDC 00436100808

## 2017-11-08 NOTE — Care Plan (Signed)
[facility]  Physical Therapy Progress Note      Patient Name: [name]  Date of Birth: 1946/01/18  Height:  154.9 cm (5\' 1" )  Weight:  83.6 kg (184 lb 4.9 oz)  Room/Bed: 975/A  Payor: MEDICARE / Plan: MEDICARE PART A AND B / Product Type: Medicare /     Assessment:     Patient with improved tolerance to activity to allow gait training. Improved gait speed noted, mild LOB with donning bathrobe able to self correct. Will continue to follow for progressive strengthening and mobility training. Discussed dch planning.    Discharge Needs:   Equipment Recommendation: front wheeled walker    The patient presents with mobility limitations due to impaired balance and impaired functional activity tolerance that significantly impair/prevent patient's ability to participate in mobility-related activities of daily living (MRADLs) including  ambulation and transfers in order to safely complete. This functional mobility deficit can be sufficiently resolved with the use of a front wheeled walker in order to decrease the risk of falls, morbidity, and mortality in performance of these MRADLs.  Patient is able to safely use this assistive device.    Discharge Disposition: home with home health    JUSTIFICATION OF DISCHARGE RECOMMENDATION   Based on current diagnosis, functional performance prior to admission, and current functional performance, this patient requires continued PT services in home with home health in order to achieve significant functional improvements in these deficit areas: aerobic capacity/endurance, gait, locomotion, and balance.      Plan:   Continue to follow patient according to established plan of care.  The risks/benefits of therapy have been discussed with the patient/caregiver and he/she is in agreement with the established plan of care.     Subjective & Objective:        11/08/17 1539   Therapist Pager   PT Assigned/ Pager # sally [phone]   Rehab Session   Document Type  therapy note (daily note)   Total PT Minutes: 18   Patient Effort good   General Information   Patient Profile Reviewed? yes   Mutuality/Individual Preferences   How to Address Anxieties/Fears recommend OOB with supervisionx1   Pre Treatment Status   Pre Treatment Patient Status Patient supine in bed   Communication Pre Treatment  Nurse   Cognitive Assessment/Interventions   Behavior/Mood Observations behavior appropriate to situation, WNL/WFL   Bed Mobility Assessment/Treatment   Supine-Sit Independence modified independence   Comment  Mobilizes to EOB mod I with use of bedrails. Good sitting balance EOB   Transfer Assessment/Treatment   Sit-Stand Independence  supervision required   Stand-Sit Independence  supervision required   Sit-Stand-Sit, Assist Device front wheeled walker   Bed-Chair Independence supervision required   Toilet Transfer Independence supervision required   Toilet Transfer Assist Device grab bar   Transfer Comment Patient required supervision for verbal cues for hand placement and positioning, posture. Relies on grab bar to stand from elevated commode. Patient independent with peri-hygiene. Mild LOB while standing to don bathrobe, able to self correct without use of AD.   Gait Assessment/Treatment   Independence  contact guard assist   Assistive Device  rolling walker   Distance in Feet 250   Total Distance Ambulated 250   Comment Patient gait trained 269ft with FWW CGA, requires occasional cues for positioning within AD and to maintain upright posture. Patient reports weakness and fatigue with gait, but demonstrates steady balance without instability.   Information systems manager  Sitting Balance: Static good balance   Sitting, Dynamic (Balance) good balance   Sit-to-Stand Balance fair + balance   Standing Balance: Static fair + balance   Standing Balance: Dynamic fair + balance   Post Treatment Status   Post Treatment Patient Status Patient sitting in bedside chair or w/c   Plan of Care Review    Plan Of Care Reviewed With patient   Physical Therapy Clinical Impression   Assessment Patient with improved tolerance to activity to allow gait training. Improved gait speed noted, mild LOB with donning bathrobe able to self correct. Will continue to follow for progressive strengthening and mobility training. Discussed dch planning.   Anticipated Equipment Needs at Discharge (PT Clinical Impression) front wheeled walker   Anticipated Discharge Disposition  home with home health       Therapist:   Stephani Police, PT   Pager #: [phone]

## 2017-11-08 NOTE — Nurses Notes (Signed)
Patient educated on call bell system and states understanding. Patient educated to "call, not fall" and states understanding. Patient's questions and concerns acknowledged and answered at this time. Patient complained of continuous aching right upper abdominal pain rated 3/10 that is aggravated by activity and alleviated by rest and medications; rest provided at this time. Patient assessments completed per flowsheet. Medications given per MAR. Patient stable at this time. Will continue to monitor and will update MD as needed.

## 2017-11-08 NOTE — Consults (Signed)
Brief Nephrology Consult Note    - Decrease NS to 50 ml/hr. If sodium increases above 128 please give 250 ml D5 and continue trending sodium. Page on-call for nephrology with questions.    Jennet Maduro, MD 11/08/2017, 16:58  PGY-5  Fellow - Nephrology  [place]           Attending Note:  I did not examine the patient today.   No new recommendations at this time.   Will continue to monitor; please call with any questions or concerns.    Late entry for 11/08/2017  I did not see or examine the patient today. I reviewed the resident's note.  I agree with the findings and plan of care as documented in the resident's note.  Any exceptions/additions are edited/noted.          Nathaniel Man, MD  Assistant Professor  Section of Nephrology  Section of [place], [place] 11/09/2017, 12:23

## 2017-11-08 NOTE — Care Plan (Signed)
Problem: Patient Care Overview (Adult,OB)  Goal: Plan of Care Review(Adult,OB)  The patient and/or their representative will communicate an understanding of their plan of care   Outcome: Ongoing (see interventions/notes)    Goal: Individualization/Patient Specific Goal(Adult/OB)  Outcome: Ongoing (see interventions/notes)    Goal: Interdisciplinary Rounds/Family Conf  Outcome: Ongoing (see interventions/notes)      Problem: Nutrition, Imbalanced: Inadequate Oral Intake (Adult)  Goal: Improved Oral Intake  Patient will demonstrate the desired outcomes by discharge/transition of care.   Outcome: Ongoing (see interventions/notes)      Comments: Pt alert and oriented.  Pt ambulating to bathroom X1 with walker.  Q4 sodium  - urine and blood- continued.  Pt denies any pain at this time.  Will continue to monitor throughout the day.   Encouraging patient to increase oral intake.  Fluid restrictions continued.  Call bell within reach.  Will continue to monitor.

## 2017-11-08 NOTE — Consults (Signed)
[facility]   Endocrinology/Diabetes Consult      Encounter Start Date:      10/25/2017  Inpatient Admission Date:  10/25/2017  Name:             [name]  Age:             71 y.o.  Attending:             Nicola Police, MD  PCP             No Pcp    Assessment  1. 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   She is now on TPN overnight from 2200 for 12 hrs with 1 can of ensure with clear liquid diet 3 times a day.  Her blood glucose remains uncontrolled.      Recommendations:   1. Increase NPH to 40 Uints at 2000 only.  2. Continue regular insulin 6 units TID AC.    3. Continue conservative regular insulin SSIP.     Thank you for the consult, we will continue to follow. Please page with any questions.      Recent Labs      11/06/17   2030  11/07/17   0546  11/07/17   1037  11/07/17   1617  11/07/17   1945  11/07/17   2201  11/08/17   0624  11/08/17   1152  11/08/17   1610   GLUIP  281*  214*  213*  152*  144*  114*  313*  98  133*     Examination:  Vital Signs:  Temp  Avg: 36.8 C (98.2 F)  Min: 36.6 C (97.9 F)  Max: 37.1 C (98.8 F)    Pulse  Avg: 84.1  Min: 75  Max: 92 BP  Min: 113/52  Max: 196/61   Resp  Avg: 19.7  Min: 18  Max: 23 SpO2  Avg: 97.2 %  Min: 94 %  Max: 100 %   Pain Score (Numeric, Faces): 4      Fonnie Birkenhead, MD,   Pager# [phone]  Internal Medicine, PGY2.        I have seen this patient and reviewed the chart and examined the patient and agree with the above note. Shela Commons, MD 11/09/2017, 07:57

## 2017-11-08 NOTE — Consults (Signed)
Nephrology Consult   Follow Up Note      Leclaire,Elspeth, 71 y.o. female  Date of Admission:  10/25/2017  Date of service: 11/08/2017  Date of Birth:  01-15-1946    Assessment:  [name] is a 71 y.o. female with hx of pancreatic cancer s/p robotic Whipple on 11/14 with hyponatremia    Hyponatremia   - likely due to thiazide diuretic + low solute intake post surgery. 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 Urine studies more suggestive of volume depletion given urine Na 20.    Recommendations:  - Agree with d/c hctz  - Start NS @ 100 ml/hr. Check Na, urine Na, and urine Osm q4h.  - Agree with decreasing free water in parenteral nutrition  - Thank you for obtaining urine studies  - Please call if sodium increases or decreases by more than 6 within a 24 hour period    Thank you for consulting Nephrology. Please call with any questions or concerns. We will continue to follow.    Subjective: Continues to have nausea this morning. Otherwise no other complaints. Sodium improved slightly.       Objective:  Physical Exam  Filed Vitals:    11/08/17 0306 11/08/17 0751 11/08/17 0752 11/08/17 1225   BP: (!) 159/62 (!) 159/49     Pulse: 90 92     Resp: 18 (!) 23     Temp: 37.1 C (98.8 F)  36.9 C (98.4 F) 36.6 C (97.9 F)   SpO2: 97% 100%       General: No apparent distress  Mouth:  Moist mucous membranes  Neck:  Supple, trachea midline, no JVD  Lungs:  CTAB, no wheezing  Heart:  RRR, no murmurs  Abdomen: Soft, non-tender to palpation + BS  Extremities: No cyanosis, no edema  Neuro:  Grossly normal  Skin:  Warm and dry      I/O last 24 hours:    Intake/Output Summary (Last 24 hours) at 11/08/17 1315  Last data filed at 11/08/17 1200   Gross per 24 hour   Intake             3738 ml   Output             2240 ml   Net             1498 ml         Current Facility-Administered Medications   Medication Dose Route Frequency Provider Last Rate Last Dose   . acetaminophen (TYLENOL) tablet  650 mg  Oral Q4H PRN Leida Lauth, MD   650 mg at 11/07/17 2219   . adult custom variable rate parenteral nutrition   Intravenous Continuous Idelle Jo, PA-C   Stopped at 11/08/17 6283   . adult custom variable rate parenteral nutrition   Intravenous Continuous Nock, Christina M, PA-C       . atorvastatin (LIPITOR) tablet  10 mg Oral QPM Cleatrice Burke, MD   10 mg at 11/07/17 1952   . calcium carbonate (TUMS) chewable tablet  500 mg Oral Daily Josetta Huddle, MD   500 mg at 11/08/17 0837   . citalopram (CELEXA) tablet  20 mg Oral Daily Cosner, Adam, PA-C   20 mg at 11/08/17 0837   . docusate sodium (COLACE) capsule  100 mg Oral 2x/day Cleatrice Burke, MD   Stopped at 11/07/17 2100   . enoxaparin PF (LOVENOX) 40 mg/0.4 mL SubQ injection  40 mg Subcutaneous Q24H Cosner,  Adam, PA-C   40 mg at 11/08/17 0646   . hydrALAZINE (APRESOLINE) injection 10 mg  10 mg Intravenous Q4H PRN Tereasa Coop, MD   10 mg at 11/05/17 2317   . HYDROcodone-acetaminophen (NORCO) 5-325 mg per tablet  1 Tab Oral Q4H PRN Katrina Stack, MD   1 Tab at 11/08/17 0226   . insulin NPH human 100 units/mL injection  40 Units Subcutaneous QPM Nakity, Hiram Comber, MD       . insulin R human 100 units/mL injection  15 Units Subcutaneous Daily Shela Commons, MD   15 Units at 11/07/17 1952   . insulin R human 100 units/mL injection  6 Units Subcutaneous 3x/day Laurier Nancy, MD   6 Units at 11/08/17 1154   . LORazepam (ATIVAN) tablet  0.5 mg Oral Daily PRN Cosner, Adam, PA-C   0.5 mg at 11/05/17 0411   . losartan (COZAAR) tablet  100 mg Oral Daily Cleatrice Burke, MD   100 mg at 11/08/17 0837   . magnesium sulfate 2 G in SW 50 mL premix IVPB  2 g Intravenous Once Katrina Stack, MD 50 mL/hr at 11/08/17 1251 2 g at 11/08/17 1251   . metoprolol succinate (TOPROL-XL) 24 hr extended release tablet  50 mg Oral Daily Cleatrice Burke, MD   50 mg at 11/08/17 0837   . NS flush syringe  2 mL Intracatheter Q8HRS Cosner, Adam, PA-C   2 mL at 11/08/17 1251     And   . NS flush syringe  2-6 mL Intracatheter Q1 MIN PRN Cosner, Adam, PA-C   5 mL at 10/31/17 0844   . NS flush syringe  10-30 mL Intracatheter Q8HRS Nicola Police, MD   10 mL at 11/08/17 1251   . NS flush syringe  20-30 mL Intracatheter Q1 MIN PRN Nicola Police, MD   20 mL at 11/01/17 1744   . NS premix infusion   Intravenous Continuous Katrina Stack, MD 100 mL/hr at 11/08/17 0841     . ondansetron (ZOFRAN) 2 mg/mL injection  4 mg Intravenous Q6H PRN Cosner, Adam, PA-C   4 mg at 11/07/17 1815   . pantoprazole (PROTONIX) delayed release tablet  40 mg Oral Daily before Breakfast Katrina Stack, MD   40 mg at 11/08/17 5462   . polyethylene glycol (MIRALAX) oral packet  17 g Oral Daily Cleatrice Burke, MD   Stopped at 11/08/17 0900   . potassium chloride 44meq per 60mL oral liquid  40 mEq Oral Daily with Breakfast Katrina Stack, MD   40 mEq at 11/08/17 0837   . sennosides-docusate sodium (SENOKOT-S) 8.6-50mg  per tablet  1 Tab Oral 2x/day Cleatrice Burke, MD   1 Tab at 11/08/17 [phone]   . simethicone (MYLICON) chewable tablet  80 mg Oral Q6H PRN Katrina Stack, MD   80 mg at 11/08/17 0311   . SSIP insulin R human (HUMULIN R) 100 units/mL injection  2-6 Units Subcutaneous 4x/day PRN Fonnie Birkenhead, MD   6 Units at 11/08/17 0093         Labs:  I have reviewed all lab values.   CBC   Recent Labs      11/06/17   0416  11/07/17   0514  11/08/17   0428   WBC  10.4  10.9  10.7   HGB  9.2*  9.1*  8.2*   HCT  26.7*  27.0*  24.4*   PLTCNT  239  268  220  Diff   Recent Labs      11/06/17   0416  11/07/17   0514  11/08/17   0428   PMNS  80  79  86   LYMPHOCYTES  4  5  4    MONOCYTES  9  10  8    EOSINOPHIL  7  5  2    BASOPHILS  1  0.09  1  0.10  0  0.04   PMNABS  8.27*  8.61*  9.22*   LYMPHSABS  0.41*  0.55*  0.43*   EOSABS  0.70*  0.55*  0.19   MONOSABS  0.91  1.04*  0.84        BMP   Recent Labs      11/06/17   0416  11/07/17   0514  11/07/17   1449  11/07/17   1820  11/07/17   2235  11/08/17   0232   11/08/17   0428  11/08/17   0654  11/08/17   1042   SODIUM  120*  118*  119*  119*  120*  120*  119*  121*  123*   POTASSIUM  4.8  4.7   --    --    --    --   5.1   --    --    CHLORIDE  91*  89*   --    --    --    --   93*   --    --    CO2  23  25   --    --    --    --   24   --    --    BUN  18  23   --    --    --    --   23   --    --    CREATININE  0.69  0.62   --    --    --    --   0.61   --    --    GLUCOSENF  338*  209*   --    --    --    --   248*   --    --    ANIONGAP  6  4   --    --    --    --   2*   --    --    BUNCRRATIO  26*  37*   --    --    --    --   38*   --    --    GFR  >59  >59   --    --    --    --   >59   --    --         Urinalysis   No results for input(s): CHARACTER, COLOR, SPECGRAVUR, PHURINE, PROTEIN, GLUCOSE, KETONES, UROBILINOGEN, LEUKOCYTES, NITRITE, EPITHCEL, BACTERIA, MUCOUS, AMORPCRY, CALCIUMUR, CASTS, BILIRUBIN, BLOOD, SQUAEPIT in the last 72 hours.        Imaging:  I have reviewed all imaging studies.         Jennet Maduro, MD 11/08/2017, 13:15  PGY-5  Fellow - Nephrology  [place]          Late entry for 11/08/2017  I saw and examined the patient, personally performed the critical or key portions of the service and discussed patient  management with the Nephrology team including the resident,fellow and ancillary staff.  Patient was examined during morning rounds.  I reviewed Dr. Andree Elk note and agree with the history, findings and plan of care.  Exceptions are edited/noted above.  Laboratory, hemodynamic and radiological data were also reviewed and discussed.    Nathaniel Man, MD  Assistant Professor  Section of Nephrology  Section of [place], [place] 11/09/2017, 12:23

## 2017-11-08 NOTE — Care Plan (Signed)
Problem: Patient Care Overview (Adult,OB)  Goal: Plan of Care Review(Adult,OB)  The patient and/or their representative will communicate an understanding of their plan of care   Outcome: Ongoing (see interventions/notes)  Patient free from falls this shift. Patient shows no new signs/symptoms of skin breakdown. Patient has no complaints of nausea, vomiting, or diarrhea this shift. Patient complained of continuous aching right upper abdominal pain that is alleviated by medications; patient has rated pain as 2, 4, 8, 9, or 10 out of 10 throughout shift; patient given appropriate pain medications throughout shift when desired. Patient has received q4 hour sodium blood draws; sodium has been 119-120 throughout shift, and Dr. Josetta Huddle has been notified. Assessments completed per Flowsheets. Medications given per MAR. Will continue to monitor and will notify MD as needed.      Goal: Individualization/Patient Specific Goal(Adult/OB)  Outcome: Ongoing (see interventions/notes)

## 2017-11-08 NOTE — Progress Notes (Signed)
[name], 71 y.o. female  Date of Admission:  10/25/2017  Date of Birth:  [birth year]  Date of Service:  11/08/2017    Post Op Day: 14 Days Post-Op S/P Procedure(s) (LRB):  ROBOTIC WHIPPLE (N/A)  BLOCK PRE-OP (N/A)  XI ROBOT SUPPLY CARD (N/A)    Chief Complaint:Pancreatic cancer (CMS [place])    Subjective: NAEO. Denies F/C, CP, SOB, N/V.     Objective:  Filed Vitals:    11/07/17 2123 11/07/17 2130 11/08/17 0048 11/08/17 0306   BP: (!) 185/65 (!) 162/68 (!) 146/71 (!) 159/62   Pulse: 84  84 90   Resp:  18 20 18    Temp:   36.9 C (98.4 F) 37.1 C (98.8 F)   SpO2: 97%  96% 97%     Pain Score (Numeric, Faces): 2    Current Medications:    Current Facility-Administered Medications:  acetaminophen (TYLENOL) tablet 650 mg Oral Q4H PRN   adult custom variable rate parenteral nutrition  Intravenous Continuous   atorvastatin (LIPITOR) tablet 10 mg Oral QPM   calcium carbonate (TUMS) chewable tablet 500 mg Oral Daily   citalopram (CELEXA) tablet 20 mg Oral Daily   docusate sodium (COLACE) capsule 100 mg Oral 2x/day   enoxaparin PF (LOVENOX) 40 mg/0.4 mL SubQ injection 40 mg Subcutaneous Q24H   hydrALAZINE (APRESOLINE) injection 10 mg 10 mg Intravenous Q4H PRN   HYDROcodone-acetaminophen (NORCO) 5-325 mg per tablet 1 Tab Oral Q4H PRN   insulin NPH human 100 units/mL injection 30 Units Subcutaneous QPM   insulin R human 100 units/mL injection 15 Units Subcutaneous Daily   insulin R human 100 units/mL injection 6 Units Subcutaneous 3x/day AC   LORazepam (ATIVAN) tablet 0.5 mg Oral Daily PRN   losartan (COZAAR) tablet 100 mg Oral Daily   magnesium sulfate 2 G in SW 50 mL premix IVPB 2 g Intravenous Once   metoprolol succinate (TOPROL-XL) 24 hr extended release tablet 50 mg Oral Daily   NS flush syringe 2 mL Intracatheter Q8HRS   And      NS flush syringe 2-6 mL Intracatheter Q1 MIN PRN   NS flush syringe 10-30 mL Intracatheter  Q8HRS   NS flush syringe 20-30 mL Intracatheter Q1 MIN PRN   NS premix infusion  Intravenous Continuous   ondansetron (ZOFRAN) 2 mg/mL injection 4 mg Intravenous Q6H PRN   pantoprazole (PROTONIX) delayed release tablet 40 mg Oral Daily before Breakfast   polyethylene glycol (MIRALAX) oral packet 17 g Oral Daily   potassium chloride 66meq per 66mL oral liquid 40 mEq Oral Daily with Breakfast   sennosides-docusate sodium (SENOKOT-S) 8.6-50mg  per tablet 1 Tab Oral 2x/day   simethicone (MYLICON) chewable tablet 80 mg Oral Q6H PRN   SSIP insulin R human (HUMULIN R) 100 units/mL injection 2-6 Units Subcutaneous 4x/day PRN       Today's Physical Exam:  Constitutional: Appears in no acute distress  Eyes: Conjunctiva clear. Pupils equal and round. Sclera non-icteric.   ENT: ENMT without erythema or injection, mucous membranes moist.  Neck: Supple, symmetrical, trachea midline  Respiratory: Even and non-labored breathing  Cardiovascular: Regular rate and rhythm  Gastrointestinal: Soft, diffusely-tender, non-distended. LLQ incision site with mild tenderness and erythema  Neurologic: Grossly normal, Alert and oriented x3  Psychiatric: Normal    I/O:    Date 11/07/17 0700 - 11/08/17 0659 11/08/17 0700 - 11/09/17 0659   Shift 0700-1459 1500-2259 2300-0659 24 Hour Total 0700-1459 1500-2259 2300-0659 24 Hour Total   I  N  T  A  K  E   P.O. 300 [phone]          Oral 300 [phone]        I.V.  (mL/kg/hr) 65  (0.09) 468  (0.68) 1570  (2.35) 2103  (1.05) 50   50      Med (IV) Flush Volume  50 50 100          Volume (adult custom variable rate parenteral nutrition) 65   65          Volume (NS premix infusion)  150 400 550 50   50      Volume (adult custom variable rate parenteral nutrition)  268 1120 1388        Shift Total  (mL/kg) 365  (4.25) 1148  (13.36) 1670  (19.98) 3183  (38.07) 50  (0.6)   50  (0.6)   O  U  T  P  U  T   Urine  (mL/kg/hr) 75  (0.11) 475  (0.69) 825  (1.23) 1375  (0.69)          Urine (Voided) 75 475 825  1375          Urine Occurrence 1 x 0 x 0 x 1 x        Emesis              Emesis Occurrence 0 x 0 x 0 x 0 x        Drains 80 140 120 340          Drain Output (Drain (Miscellaneous) JP Right Abdomen) 80 140 120 340        Stool              Stool Occurrence 1 x 0 x 0 x 1 x        Shift Total  (mL/kg) 155  (1.8) 615  (7.16) 945  (11.3) 1715  (20.51)       Weight (kg) 85.9 85.9 83.6 83.6 83.6 83.6 83.6 83.6       Nutrition:  MNT PROTOCOL FOR DIETITIAN  DIETARY ORAL SUPPLEMENTS Oral Supplements with tray: Ensure Clear-Apple; BREAKFAST/LUNCH/DINNER; 1 Can  DIET CLEAR LIQUID  adult custom variable rate parenteral nutrition    Labs:  I have reviewed all lab values.   CBC   Recent Labs      11/06/17   0416  11/07/17   0514  11/08/17   0428   WBC  10.4  10.9  10.7   HGB  9.2*  9.1*  8.2*   HCT  26.7*  27.0*  24.4*   PLTCNT  239  268  220      Diff   Recent Labs      11/06/17   0416  11/07/17   0514  11/08/17   0428   PMNS  80  79  86   LYMPHOCYTES  4  5  4    MONOCYTES  9  10  8    EOSINOPHIL  7  5  2    BASOPHILS  1  0.09  1  0.10  0  0.04   PMNABS  8.27*  8.61*  9.22*  LYMPHSABS  0.41*  0.55*  0.43*   EOSABS  0.70*  0.55*  0.19   MONOSABS  0.91  1.04*  0.84        BMP   Recent Labs      11/06/17   0416  11/07/17   0514  11/07/17   1449  11/07/17   1820  11/07/17   2235  11/08/17   0232  11/08/17   0428   SODIUM  120*  118*  119*  119*  120*  120*  119*   POTASSIUM  4.8  4.7   --    --    --    --   5.1   CHLORIDE  91*  89*   --    --    --    --   93*   CO2  23  25   --    --    --    --   24   BUN  18  23   --    --    --    --   23   CREATININE  0.69  0.62   --    --    --    --   0.61   GLUCOSENF  338*  209*   --    --    --    --   248*   ANIONGAP  6  4   --    --    --    --   2*   BUNCRRATIO  26*  37*   --    --    --    --   38*   GFR  >59  >59   --    --    --    --   >59        Assessment/ Plan:   Active Hospital Problems   (*Primary Problem)    Diagnosis   . *Pancreatic cancer (CMS [place])     [name] is  a 71 y.o. female 59 Days Post-Op s/p robotic whipple for pancreatic cancer    Home Medications:  Lipitor, celexa, HCTZ, losartan, toprol XL    POD: 14 Days Post-Op  Diet/fluids: TPN, clear liquid, ensure  Activity: therapeutic activity TID, OOB  Pain control: tylenol, norco  DVT ppx: lovenox  Bowel reg/GI ppx: senokot, colace / protonix  Last BM: 11/05/17  PT/OT: home w/ HH  Lines: PICC, JP drain  Dispo: TBD    Plans:   Hyponatremia: nephro following, appreciate recs: NS 157mL/hr, cont monitor Na, urine osm, urine Na  Hyperglycemia: endocrine following, appreciate recs: PM NPH 30u, PM reg 15u, AC reg 6u    Feleshia Zundel J. Bertram Denver, MD  11/08/2017 08:13

## 2017-11-09 DIAGNOSIS — E11649 Type 2 diabetes mellitus with hypoglycemia without coma: Secondary | ICD-10-CM

## 2017-11-09 LAB — CBC WITH DIFF
BASOPHIL #: 0.1 10*3/uL (ref 0.00–0.20)
BASOPHIL %: 1 %
EOSINOPHIL #: 0.44 10*3/uL (ref 0.00–0.50)
EOSINOPHIL %: 5 %
EOSINOPHIL %: 5 %
HCT: 26 % — ABNORMAL LOW (ref 33.5–45.2)
HGB: 8.8 g/dL — ABNORMAL LOW (ref 11.2–15.2)
HGB: 8.8 g/dL — ABNORMAL LOW (ref 11.2–15.2)
LYMPHOCYTE #: 0.38 10*3/uL — ABNORMAL LOW (ref 1.00–4.80)
LYMPHOCYTE %: 5 %
MCH: 28.9 pg (ref 27.4–33.0)
MCHC: 33.9 g/dL (ref 32.5–35.8)
MCV: 85.3 fL (ref 78.0–100.0)
MONOCYTE #: 0.76 10*3/uL (ref 0.30–1.00)
MONOCYTE #: 0.76 x10?3/uL (ref 0.30–1.00)
MONOCYTE %: 9 %
MPV: 8.2 fL (ref 7.5–11.5)
NEUTROPHIL #: 6.75 10*3/uL (ref 1.50–7.70)
NEUTROPHIL %: 80 %
PLATELETS: 227 10*3/uL (ref 140–450)
RBC: 3.04 10*6/uL — ABNORMAL LOW (ref 3.63–4.92)
RDW: 16.7 % — ABNORMAL HIGH (ref 12.0–15.0)
WBC: 8.4 10*3/uL (ref 3.5–11.0)

## 2017-11-09 LAB — HEPATIC FUNCTION PANEL
ALKALINE PHOSPHATASE: 84 U/L (ref <150)
ALKALINE PHOSPHATASE: 84 U/L (ref <150)
ALT (SGPT): 7 U/L (ref <55)
AST (SGOT): 17 U/L (ref 8–41)
BILIRUBIN DIRECT: 0.1 mg/dL (ref <0.3)
BILIRUBIN TOTAL: 0.3 mg/dL (ref 0.3–1.3)

## 2017-11-09 LAB — BASIC METABOLIC PANEL
ANION GAP: 3 mmol/L — ABNORMAL LOW (ref 4–13)
BUN/CREA RATIO: 45 — ABNORMAL HIGH (ref 6–22)
BUN/CREA RATIO: 45 — ABNORMAL HIGH (ref 6–22)
BUN: 25 mg/dL (ref 8–25)
CALCIUM: 8.4 mg/dL — ABNORMAL LOW (ref 8.5–10.2)
CHLORIDE: 97 mmol/L (ref 96–111)
CO2 TOTAL: 23 mmol/L (ref 22–32)
CREATININE: 0.56 mg/dL (ref 0.49–1.10)
ESTIMATED GFR: >59 mL/min/{1.73_m2} (ref >59)
GLUCOSE: 232 mg/dL — ABNORMAL HIGH (ref 65–139)
POTASSIUM: 4.9 mmol/L (ref 3.5–5.1)
SODIUM: 123 mmol/L — ABNORMAL LOW (ref 136–145)

## 2017-11-09 LAB — SODIUM
SODIUM: 123 mmol/L — ABNORMAL LOW (ref 136–145)
SODIUM: 124 mmol/L — ABNORMAL LOW (ref 136–145)
SODIUM: 123 mmol/L — ABNORMAL LOW (ref 136–145)
SODIUM: 123 mmol/L — ABNORMAL LOW (ref 136–145)
SODIUM: 123 mmol/L — ABNORMAL LOW (ref 136–145)

## 2017-11-09 LAB — OSMOLALITY, RANDOM URINE
OSMOLALITY URINE: 330 mOsm/kg (ref 50–1400)
OSMOLALITY URINE: 579 mOsm/kg (ref 50–1400)
OSMOLALITY URINE: 612 mOsm/kg (ref 50–1400)

## 2017-11-09 LAB — POC BLOOD GLUCOSE (RESULTS)
GLUCOSE, POC: 279 mg/dL — ABNORMAL HIGH (ref 70–105)
GLUCOSE, POC: 216 mg/dL — ABNORMAL HIGH (ref 70–105)
GLUCOSE, POC: 171 mg/dL — ABNORMAL HIGH (ref 70–105)
GLUCOSE, POC: 219 mg/dL — ABNORMAL HIGH (ref 70–105)
GLUCOSE, POC: 92 mg/dL (ref 70–105)

## 2017-11-09 LAB — TRIGLYCERIDE
TRIGLYCERIDES: 71 mg/dL (ref <=150)
TRIGLYCERIDES: 51 mg/dL (ref <=150)

## 2017-11-09 LAB — SODIUM, RANDOM URINE: SODIUM RANDOM URINE: 23 mmol/L

## 2017-11-09 LAB — PHOSPHORUS: PHOSPHORUS: 3.7 mg/dL (ref 2.3–4.0)

## 2017-11-09 MED ORDER — INSULIN U-100 REGULAR HUMAN 100 UNIT/ML INJECTION SOLUTION
10.0000 [IU] | Freq: Every day | INTRAMUSCULAR | Status: DC
Start: 2017-11-09 — End: 2017-11-15
  Administered 2017-11-09 – 2017-11-12 (×4): 10 [IU] via SUBCUTANEOUS
  Administered 2017-11-13 – 2017-11-14 (×2): 0 [IU] via SUBCUTANEOUS

## 2017-11-09 MED ORDER — MAGNESIUM SULFATE 2 GRAM/50 ML (4 %) IN WATER INTRAVENOUS PIGGYBACK
2.0000 g | INJECTION | Freq: Once | INTRAVENOUS | Status: AC
Start: 2017-11-09 — End: 2017-11-09
  Administered 2017-11-09: 2 g via INTRAVENOUS
  Administered 2017-11-09: 0 g via INTRAVENOUS
  Filled 2017-11-09: qty 50

## 2017-11-09 MED ORDER — INSULIN NPH ISOPHANE U-100 HUMAN 100 UNIT/ML SUBCUTANEOUS SUSPENSION
50.0000 [IU] | Freq: Every evening | SUBCUTANEOUS | Status: DC
  Administered 2017-11-09 – 2017-11-14 (×6): 50 [IU] via SUBCUTANEOUS
  Filled 2017-11-09 (×2): qty 300

## 2017-11-09 MED ORDER — TRACE ELEMENTS CR-CU-MN-SE-ZN 10 MCG-1 MG-0.5 MG-60 MCG-5MG/ML IV SOLN
INTRAVENOUS | Status: AC
Start: 2017-11-09 — End: 2017-11-10
  Filled 2017-11-09: qty 950

## 2017-11-09 MED ORDER — SODIUM CHLORIDE 1 GRAM TABLET
1.00 g | ORAL_TABLET | Freq: Three times a day (TID) | ORAL | Status: DC
Start: 2017-11-09 — End: 2017-11-10
  Administered 2017-11-09 – 2017-11-10 (×3): 1 g via ORAL
  Filled 2017-11-09 (×5): qty 1

## 2017-11-09 MED ADMIN — sodium chloride 0.9 % intravenous solution: INTRAVENOUS

## 2017-11-09 MED ADMIN — simethicone 80 mg chewable tablet: ORAL | @ 22:00:00

## 2017-11-09 MED ADMIN — lactated Ringers intravenous solution: ORAL

## 2017-11-09 MED ADMIN — nitrofurantoin monohydrate/macrocrystals 100 mg capsule: @ 20:00:00

## 2017-11-09 MED ADMIN — sodium chloride 0.9 % (flush) injection syringe: INTRAVENOUS | @ 20:00:00

## 2017-11-09 NOTE — Progress Notes (Signed)
[name], 71 y.o. female  Date of Admission:  10/25/2017  Date of Birth:  1946/11/21  Date of Service:  11/09/2017    Post Op Day: 15 Days Post-Op S/P Procedure(s) (LRB):  ROBOTIC WHIPPLE (N/A)  BLOCK PRE-OP (N/A)  XI ROBOT SUPPLY CARD (N/A)    Chief Complaint:Pancreatic cancer (CMS [place])    Subjective: NAEO. Patient says she feels "gassy." Denies F/C, CP, SOB, N/V.     Objective:  Filed Vitals:    11/09/17 0016 11/09/17 0403 11/09/17 0816 11/09/17 0817   BP: (!) 136/54 (!) 158/60  (!) 161/47   Pulse: 68 84  98   Resp: 19 20  (!) 27   Temp: 36.3 C (97.4 F) 36.5 C (97.7 F) 36.6 C (97.9 F)    SpO2: 98% 97%  94%     Pain Score (Numeric, Faces): 10    Current Medications:    Current Facility-Administered Medications:  acetaminophen (TYLENOL) tablet 650 mg Oral Q4H PRN   adult custom variable rate parenteral nutrition  Intravenous Continuous   atorvastatin (LIPITOR) tablet 10 mg Oral QPM   calcium carbonate (TUMS) chewable tablet 500 mg Oral Daily   citalopram (CELEXA) tablet 20 mg Oral Daily   docusate sodium (COLACE) capsule 100 mg Oral 2x/day   enoxaparin PF (LOVENOX) 40 mg/0.4 mL SubQ injection 40 mg Subcutaneous Q24H   hydrALAZINE (APRESOLINE) injection 10 mg 10 mg Intravenous Q4H PRN   HYDROcodone-acetaminophen (NORCO) 5-325 mg per tablet 1 Tab Oral Q4H PRN   insulin NPH human 100 units/mL injection 40 Units Subcutaneous QPM   insulin R human 100 units/mL injection 15 Units Subcutaneous Daily   insulin R human 100 units/mL injection 6 Units Subcutaneous 3x/day AC   LORazepam (ATIVAN) tablet 0.5 mg Oral Daily PRN   losartan (COZAAR) tablet 100 mg Oral Daily   metoprolol succinate (TOPROL-XL) 24 hr extended release tablet 50 mg Oral Daily   NS flush syringe 2 mL Intracatheter Q8HRS   And      NS flush syringe 2-6 mL Intracatheter Q1 MIN PRN   NS flush syringe 10-30 mL Intracatheter Q8HRS   NS flush syringe  20-30 mL Intracatheter Q1 MIN PRN   NS premix infusion  Intravenous Continuous   ondansetron (ZOFRAN) 2 mg/mL injection 4 mg Intravenous Q6H PRN   pantoprazole (PROTONIX) delayed release tablet 40 mg Oral Daily before Breakfast   polyethylene glycol (MIRALAX) oral packet 17 g Oral Daily   potassium chloride 61meq per 90mL oral liquid 40 mEq Oral Daily with Breakfast   sennosides-docusate sodium (SENOKOT-S) 8.6-50mg  per tablet 1 Tab Oral 2x/day   simethicone (MYLICON) chewable tablet 80 mg Oral Q6H PRN   SSIP insulin R human (HUMULIN R) 100 units/mL injection 2-6 Units Subcutaneous 4x/day PRN       Today's Physical Exam:  Constitutional: Appears in no acute distress  Eyes: Conjunctiva clear. Pupils equal and round. Sclera non-icteric.   ENT: ENMT without erythema or injection, mucous membranes moist.  Neck: Supple, symmetrical, trachea midline  Respiratory: Even and non-labored breathing  Cardiovascular: Regular rate and rhythm  Gastrointestinal: Soft, diffusely-tender, non-distended. LLQ incision site with mild tenderness and erythema, packing in place  Neurologic: Grossly normal, Alert and  oriented x3  Psychiatric: Normal    I/O:    Date 11/08/17 0700 - 11/09/17 0659 11/09/17 0700 - 11/10/17 0659   Shift 0700-1459 1500-2259 2300-0659 24 Hour Total 0700-1459 1500-2259 2300-0659 24 Hour Total   I  N  T  A  K  E   P.O. 720 420  1140 500   500      Oral 720 420  1140 500   500    I.V.  (mL/kg/hr) 550  (0.82) 695  (1.04) 1630  (2.25) 2875  (1.33) 245   245      IVPB Volume 50  50 100          Med (IV) Flush Volume  50 60 110          Volume (NS premix infusion) 100   100          Volume (NS premix infusion) 400   400          Volume (adult custom variable rate parenteral nutrition)  345 1120 1465 195   195      Volume (NS premix infusion)  300 400 700 50   50    Shift Total  (mL/kg) 1270  (15.19) 1115  (13.34) 1630  (18.03) 4015  (44.41) 745  (8.24)   745  (8.24)   O  U  T  P  U  T   Urine  (mL/kg/hr) 200  (0.3) 775   (1.16) 500  (0.69) 1475  (0.68) 250   250      Urine (Voided) 200 [phone] 250   250      Urine Occurrence 1 x 0 x 1 x 2 x        Emesis              Emesis Occurrence 0 x 0 x 0 x 0 x 0 x   0 x    Drains 130 190 90 410 110   110      Drain Output (Drain (Miscellaneous) JP Right Abdomen) 130 190 90 410 110   110    Other 350   350          Mixed 350   350        Stool              Stool Occurrence 1 x 0 x 0 x 1 x 0 x   0 x    Shift Total  (mL/kg) 680  (8.13) 965  (11.54) 590  (6.53) 2235  (24.72) 360  (3.98)   360  (3.98)   Weight (kg) 83.6 83.6 90.4 90.4 90.4 90.4 90.4 90.4       Nutrition:  MNT PROTOCOL FOR DIETITIAN  DIETARY ORAL SUPPLEMENTS Oral Supplements with tray: Ensure Clear-Apple; BREAKFAST/LUNCH/DINNER; 1 Can  DIET CLEAR LIQUID  adult custom variable rate parenteral nutrition    Labs:  I have reviewed all lab values.   CBC   Recent Labs      11/07/17   0514  11/08/17   0428  11/09/17   0400   WBC  10.9  10.7  8.4   HGB  9.1*  8.2*  8.8*   HCT  27.0*  24.4*  26.0*   PLTCNT  268  220  227      Diff   Recent Labs      11/07/17   0514  11/08/17   0428  11/09/17   0400  PMNS  79  86  80   LYMPHOCYTES  5  4  5    MONOCYTES  10  8  9    EOSINOPHIL  5  2  5    BASOPHILS  1  0.10  0  0.04  1  0.10   PMNABS  8.61*  9.22*  6.75   LYMPHSABS  0.55*  0.43*  0.38*   EOSABS  0.55*  0.19  0.44   MONOSABS  1.04*  0.84  0.76        BMP   Recent Labs      11/07/17   0514  11/07/17   1449  11/07/17   1820  11/07/17   2235  11/08/17   0232  11/08/17   0428  11/08/17   0654  11/08/17   1042  11/08/17   1411  11/08/17   1822  11/08/17   2229  11/09/17   0233  11/09/17   0400  11/09/17   0627   SODIUM  118*  119*  119*  120*  120*  119*  121*  123*  124*  122*  125*  123*  123*  124*   POTASSIUM  4.7   --    --    --    --   5.1   --    --    --    --    --    --   4.9   --    CHLORIDE  89*   --    --    --    --   93*   --    --    --    --    --    --   97   --    CO2  25   --    --    --    --   24   --    --    --    --     --    --   23   --    BUN  23   --    --    --    --   23   --    --    --    --    --    --   25   --    CREATININE  0.62   --    --    --    --   0.61   --    --    --    --    --    --   0.56   --    GLUCOSENF  209*   --    --    --    --   248*   --    --    --    --    --    --   232*   --    ANIONGAP  4   --    --    --    --   2*   --    --    --    --    --    --   3*   --    BUNCRRATIO  37*   --    --    --    --   38*   --    --    --    --    --    --  45*   --    GFR  >59   --    --    --    --   >59   --    --    --    --    --    --   >59   --         Assessment/ Plan:   Active Hospital Problems   (*Primary Problem)    Diagnosis   . *Pancreatic cancer (CMS [place])     [name] is a 71 y.o. female 45 Days Post-Op s/p robotic whipple for pancreatic cancer    Home Medications:  Lipitor, celexa, HCTZ, losartan, toprol XL    POD: 15 Days Post-Op  Diet/fluids: TPN, clear liquid, ensure  Activity: therapeutic activity TID, OOB  Pain control: tylenol, norco  DVT ppx: lovenox  Bowel reg/GI ppx: senokot, colace / protonix  Last BM: 11/08/17  PT/OT: home w/ HH  Lines: PICC, JP drain  Dispo: TBD    Plans:   Hyponatremia: nephro following, appreciate recs: NS decreased to 7mL/hr, cont monitor Na, urine osm, urine Na  Hyperglycemia: endocrine following, appreciate recs: PM NPH 40u, Insulin R 6u TID. SSIP  Work on placement. Likely dispo to SNF with TPN. Home with TPN not approved.   Mylicon for gas relief.      Cleatrice Burke, MD  11/09/2017, 10:03        I saw and examined the patient.  I reviewed the resident's note.  I agree with the findings and plan of care as documented in the resident's note.      Slowly improving.  Sodium improved.  Appreciate renal recs.  Wound looking better after drainage yesterday.  Arranging SNF.    Lynwood Dawley, MD

## 2017-11-09 NOTE — Nurses Notes (Signed)
Paged Cleatrice Burke. Sodium 123.

## 2017-11-09 NOTE — Care Plan (Signed)
Problem: Patient Care Overview (Adult,OB)  Goal: Plan of Care Review(Adult,OB)  The patient and/or their representative will communicate an understanding of their plan of care   Outcome: Ongoing (see interventions/notes)      Problem: Nutrition, Parenteral (Adult)  Prevent and manage potential problems including:  1. fluid/electrolyte imbalance  2. glycemic control impaired  3. infection  4. infusion-related complications  5. malnutrition  Goal: Signs and Symptoms of Listed Potential Problems Will be Absent, Minimized or Managed (Nutrition, Parenteral)  Signs and symptoms of listed potential problems will be absent, minimized or managed by discharge/transition of care (reference Nutrition, Parenteral (Adult) CPG).  Outcome: Ongoing (see interventions/notes)      Medical Nutrition Therapy Follow Up        SUBJECTIVE : Pt tolerating clears, eating 100%    OBJECTIVE:     Current Diet Order/Nutrition Support:    MNT PROTOCOL FOR DIETITIAN  DIETARY ORAL SUPPLEMENTS Oral Supplements with tray: Ensure Clear-Apple; BREAKFAST/LUNCH/DINNER; 1 Can  DIET CLEAR LIQUID  adult custom variable rate parenteral nutrition  adult custom variable rate parenteral nutrition provides  TPN  Rate: cycled 12 hours at night: 65 ml x first hour, 140 ml x 10 hours, 65 ml the last hour  1600 cal /day = 28 cal/kg  95 g protein = 2 g/ kg  660 dextrose cals GIR 2.3  560 IL cals or 1 g fat/kg            Height Used for Calculations: 154.9 cm (5\' 1" )  Weight Used For Calculations: 85.9 kg (189 lb 6 oz)  Weight Trends: 11/29- 90.4 kg   BMI (kg/m2): 35.86    IBW: 47.7 kg   %IBW: 180 %   Adj BW: 57.2 kg         Estimated Needs:    Energy Calorie Requirements: 1600-1700 per day (28-30 kcals/57.2 kg Adj. BW)  Protein Requirements (gms/day): 57-67 per day (1.2-1.4g/47.7 kg IBW)  Fluid Requirements: 1600-1700 per day (28-30 mLs/57.2 kg Adj. BW)    Comments: 71 y.o. female 6 Days Post-Op robotic whipple for pancreatic cancer, severely fatigued with poor  appetite, has since developed chyle leak    Recommend :   Continue clear liquid diet  Continue current TPN at this time  Continue to Monitor potassium, magnesium and phosphorus daily along with blood sugars and BMP  Monitor LFTs and triglycerides weekly.   Monitor weekly weights.   Will follow.       Nutrition Diagnosis: Altered GI function related to chyle leak and poor appetite as evidenced by need for supplemental TPN    Dierdre Searles, RD, LD, CNSC 11/09/2017, 13:08  Pager [phone]

## 2017-11-09 NOTE — Nurses Notes (Addendum)
1600 dose of insulin given at 1705; awaiting meal tray prior to administering evening dose.

## 2017-11-09 NOTE — Progress Notes (Signed)
Endocrinology:  Ms. Omahoney his blood sugars are drifting up in the morning and she is also having some low blood sugars related to the feeds and the NPH and regular given prior to the onset of the feeds  Recent Labs      11/07/17   1617  11/07/17   1945  11/07/17   2201  11/08/17   0624  11/08/17   1152  11/08/17   1610  11/08/17   1934  11/08/17   2204  11/09/17   0603  11/09/17   0924  11/09/17   1135   GLUIP  152*  144*  114*  313*  98  133*  127*  80  279*  216*  171*           BP (!) 132/51  Pulse 81  Temp 36.6 C (97.8 F)  Resp (!) 25  Ht 1.549 m (5\' 1" )  Wt 90.4 kg (199 lb 4.7 oz)  SpO2 99%  BMI 37.66 kg/m2    Elderly woman chronic illness blood sugars as above  Assessment hypo and hyperglycemia  Plan I will adjust medications of insulin   Orders done  Shela Commons, MD  11/09/2017, 15:44

## 2017-11-09 NOTE — Nurses Notes (Signed)
Patient TPN stopped at 0900. Blood sugar obtained per orders. Fingerstick of 216. Coverage given per MAR. Service aware coverage given.

## 2017-11-09 NOTE — Care Plan (Addendum)
Problem: Patient Care Overview (Adult,OB)  Goal: Plan of Care Review(Adult,OB)  The patient and/or their representative will communicate an understanding of their plan of care   Outcome: Ongoing (see interventions/notes)    Goal: Individualization/Patient Specific Goal(Adult/OB)  Outcome: Ongoing (see interventions/notes)    Goal: Interdisciplinary Rounds/Family Conf  Outcome: Ongoing (see interventions/notes)      Problem: Perioperative Period (Adult)  Prevent and manage potential problems including:1. bleeding2. gastrointestinal complications3. hypothermia4. infection5. pain6. perioperative injury7. respiratory compromise8. situational response9. urinary retention10. venous [id number]. wound complications   Goal: Signs and Symptoms of Listed Potential Problems Will be Absent or Manageable (Perioperative Period)  Signs and symptoms of listed potential problems will be absent or manageable by discharge/transition of care (reference Perioperative Period (Adult) CPG).   Outcome: Ongoing (see interventions/notes)      Problem: Nutrition, Imbalanced: Inadequate Oral Intake (Adult)  Goal: Improved Oral Intake  Patient will demonstrate the desired outcomes by discharge/transition of care.   Outcome: Ongoing (see interventions/notes)    Goal: Prevent Further Weight Loss  Patient will demonstrate the desired outcomes by discharge/transition of care.   Outcome: Ongoing (see interventions/notes)      Problem: Nutrition, Parenteral (Adult)  Prevent and manage potential problems including:  1. fluid/electrolyte imbalance  2. glycemic control impaired  3. infection  4. infusion-related complications  5. malnutrition   Goal: Signs and Symptoms of Listed Potential Problems Will be Absent, Minimized or Managed (Nutrition, Parenteral)  Signs and symptoms of listed potential problems will be absent, minimized or managed by discharge/transition of care (reference Nutrition, Parenteral (Adult) CPG).   Outcome: Ongoing (see  interventions/notes)    Goal: Signs and Symptoms of Listed Potential Problems Will be Absent, Minimized or Managed (Nutrition, Parenteral)  Signs and symptoms of listed potential problems will be absent, minimized or managed by discharge/transition of care (reference Nutrition, Parenteral (Adult) CPG).   Outcome: Ongoing (see interventions/notes)      Comments: Patient is calm and cooperative. Alert and oriented x 4. Q6 sodium levels drawn; urine collected for sodium sample per orders. Vital signs stable. Patient turned and repositioned to alleviate discomfort in abdomen. Ambulated throughout hallways with PT; tolerated well. Fall and skin precautions maintained with patient wearing non skid socks when out of bed and repositioned frequently. Incisions and drains monitored per orders. Service at bedside to change wound dressing. Stated patient tolerated well. PICC and Port flush and draw with ordered medications infusing per MAR. Patient medicated per MAR. PRN medications administered as needed. Fingersticks obtained per order; coverage given as needed. Will continue to monitor.

## 2017-11-09 NOTE — Nurses Notes (Signed)
Fingerstick 107. Does not need coverage at this time.

## 2017-11-09 NOTE — Care Plan (Signed)
[facility]  Physical Therapy Progress Note      Patient Name: [name]  Date of Birth: 08/10/1946  Height:  154.9 cm (5\' 1" )  Weight:  90.4 kg (199 lb 4.7 oz)  Room/Bed: 975/A  Payor: MEDICARE / Plan: MEDICARE PART A AND B / Product Type: Medicare /     Assessment:     Patient with slow improvement in functional mobility and strength. Patient continues to complain of weakness, with continued poor PO intake. Patient continues to increase independence with mobility, would benefit from inceased activity. Will continue to follow intermittently. Recommend dch home with HHPT.    Discharge Needs:   Equipment Recommendation: none anticipated      Discharge Disposition: home with home health    JUSTIFICATION OF DISCHARGE RECOMMENDATION   Based on current diagnosis, functional performance prior to admission, and current functional performance, this patient requires continued PT services in home with home health in order to achieve significant functional improvements in these deficit areas: aerobic capacity/endurance, gait, locomotion, and balance.      Plan:   Continue to follow patient according to established plan of care.  The risks/benefits of therapy have been discussed with the patient/caregiver and he/she is in agreement with the established plan of care.     Subjective & Objective:        11/09/17 1033   Therapist Pager   PT Assigned/ Pager # [phone]   Rehab Session   Document Type therapy note (daily note)   Total PT Minutes: 17   Patient Effort fair   Mutuality/Individual Preferences   How to Address Anxieties/Fears recommend OOB with supervisionx1   Pre Treatment Status   Pre Treatment Patient Status Patient supine in bed   Communication Pre Treatment  Nurse   Cognitive Assessment/Interventions   Behavior/Mood Observations lethargic   Comment Patient slightly lethargic, slow with movements and speech. Recently took pain medication   Vital Signs   Vitals Comment SpO2  100% after ambulation while on RA   Pain Assessment   Pre/Post Treatment Pain Comment pain under right rib cage - attributes to gas   Bed Mobility Assessment/Treatment   Supine-Sit Independence modified independence   Sit to Supine, Independence minimum assist (75% patient effort)   Comment  Patient requires min assist to lift RLE into bed to return to supine.   Transfer Assessment/Treatment   Sit-Stand Independence  supervision required   Stand-Sit Independence  supervision required   Sit-Stand-Sit, Assist Device front wheeled walker   Transfer Comment Patient demonstrates poor carry over with safety cues for hand placement during transfers sit<>stand. Relies on FWW to pull self into standing, instructed on proper hand placement.   Gait Assessment/Treatment   Independence  supervision required   Assistive Device  rolling walker   Distance in Feet 250   Total Distance Ambulated 250   Comment Patient gait trained with steady pace, equal step length, slightly flexed posture. Patient tends to keep AD anterior to BOS, improved with verbal corrections. No LOB or instability noted.   Balance Skill Training   Sitting Balance: Static good balance   Sitting, Dynamic (Balance) good balance   Sit-to-Stand Balance fair + balance   Standing Balance: Static fair + balance   Standing Balance: Dynamic fair + balance   Post Treatment Status   Post Treatment Patient Status Patient supine in bed   Communication Post Treatement Nurse   Plan of Care Review   Plan Of Care Reviewed  With patient   Physical Therapy Clinical Impression   Assessment Patient with slow improvement in functional mobility and strength. Patient continues to complain of weakness, with continued poor PO intake. Patient continues to increase independence with mobility, would benefit from inceased activity. Will continue to follow intermittently. Recommend dch home with HHPT.   Anticipated Equipment Needs at Discharge (PT Clinical Impression) none anticipated      Anticipated Discharge Disposition  home with home health       Therapist:   Stephani Police, PT   Pager #: [phone]

## 2017-11-09 NOTE — Care Management Notes (Signed)
[facility] Management Note    Patient Name: [name]  Date of Birth: 12-18-1945  Sex: female  Date/Time of Admission: 10/25/2017  5:34 AM  Room/Bed: 975/A  Payor: MEDICARE / Plan: MEDICARE PART A AND B / Product Type: Medicare /    LOS: 15 days   PCP: No Pcp    Admitting Diagnosis:  Pancreatic cancer (CMS [facility]) [C25.9]    Assessment:      11/09/17 1602   Assessment Details   Assessment Type Continued Assessment   Date of Care Management Update 11/09/17   Date of Next DCP Update 11/12/17   Care Management Plan   Discharge Planning Status plan in progress   Projected Discharge Date 11/11/17   CM will evaluate for rehabilitation potential yes   Patient choice offered to patient/family yes   Form for patient choice reviewed/signed and on chart yes   Facility or [facility] [place] Bed U nit   Patient aware of possible cost for ambulance transport?  Yes   Discharge Needs Assessment   Discharge Facility/Level Of Care Needs SNF Placement (Medicare certified)(code 3)   Transportation Available ambulance       Discharge Plan:  Swing Bed-Hospital Based (code 6)  Per service patient continues TPN and possibly discharge ready tomorrow 11/30.  [place] spoke with [place] at [facility] in [place], [place] [phone] who states their swing bed unit is new and they do have beds available.  [facility] met with patient, and sister, Caryl Bis at bedside choice obtained, signed, place on chart.  Referral faxed to [place] at [facility].  CCC to follow.     The patient will continue to be evaluated for developing discharge needs.     Case Manager: Frederik Pear, RN  Phone: [phone]

## 2017-11-09 NOTE — Consults (Signed)
Nephrology Consult   Follow Up Note      [name],[name], 71 y.o. female  Date of Admission:  10/25/2017  Date of service: 11/09/2017  Date of Birth:  11-23-1946    Assessment:  [name] is a 71 y.o. female with hx of pancreatic cancer s/p robotic Whipple on 11/14 with hyponatremia    Hyponatremia   - likely due to thiazide diuretic + low solute intake post surgery. 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 Agree with fluid restriction. Start salt tabs 1 g tid. Follow sodium, urine sodium, and urine osmolality q6h today.  - Agree with decreasing free water in parenteral nutrition  - Goal sodium for tomorrow morning will be 130. Please call if Na less than 120 or greater than 132.    Thank you for consulting Nephrology. Please call with any questions or concerns. We will continue to follow.    Subjective: nausea better today. Having a small amount of PO intake.       Objective:  Physical Exam  Filed Vitals:    11/09/17 0403 11/09/17 0816 11/09/17 0817 11/09/17 1136   BP: (!) 158/60  (!) 161/47    Pulse: 84  98    Resp: 20  (!) 27    Temp: 36.5 C (97.7 F) 36.6 C (97.9 F)  36.9 C (98.4 F)   SpO2: 97%  94%      General: No apparent distress  Mouth:  Moist mucous membranes  Neck:  Supple, trachea midline, no JVD  Lungs:  CTAB, no wheezing  Heart:  RRR, no murmurs  Abdomen: Soft, non-tender to palpation + BS  Extremities: No cyanosis, no edema  Neuro:  Grossly normal  Skin:  Warm and dry      I/O last 24 hours:      Intake/Output Summary (Last 24 hours) at 11/09/17 1250  Last data filed at 11/09/17 1228   Gross per 24 hour   Intake             4540 ml   Output             1915 ml   Net             2625 ml         Current Facility-Administered Medications   Medication Dose Route Frequency  Provider Last Rate Last Dose   . acetaminophen (TYLENOL) tablet  650 mg Oral Q4H PRN Leida Lauth, MD   650 mg at 11/07/17 2219   . adult custom variable rate parenteral nutrition   Intravenous Continuous Idelle Jo, PA-C   Stopped at 11/09/17 4193   . adult custom variable rate parenteral nutrition   Intravenous Continuous Cosner, Adam, PA-C       . atorvastatin (LIPITOR) tablet  10 mg Oral QPM Cleatrice Burke, MD   10 mg at 11/08/17 1943   . calcium carbonate (TUMS) chewable tablet  500 mg Oral Daily Josetta Huddle, MD   500 mg at 11/09/17 0802   . citalopram (CELEXA) tablet  20 mg Oral Daily Cosner, Adam, PA-C   20 mg at 11/09/17 0802   . docusate sodium (COLACE) capsule  100 mg Oral 2x/day Cleatrice Burke, MD   Stopped at 11/07/17 2100   . enoxaparin PF (LOVENOX)  40 mg/0.4 mL SubQ injection  40 mg Subcutaneous Q24H Cosner, Adam, PA-C   40 mg at 11/09/17 0630   . hydrALAZINE (APRESOLINE) injection 10 mg  10 mg Intravenous Q4H PRN Tereasa Coop, MD   10 mg at 11/05/17 2317   . HYDROcodone-acetaminophen (NORCO) 5-325 mg per tablet  1 Tab Oral Q4H PRN Katrina Stack, MD   1 Tab at 11/09/17 0937   . insulin NPH human 100 units/mL injection  40 Units Subcutaneous QPM Fonnie Birkenhead, MD   40 Units at 11/08/17 1936   . insulin R human 100 units/mL injection  15 Units Subcutaneous Daily Shela Commons, MD   15 Units at 11/08/17 1935   . insulin R human 100 units/mL injection  6 Units Subcutaneous 3x/day Laurier Nancy, MD   6 Units at 11/09/17 1153   . LORazepam (ATIVAN) tablet  0.5 mg Oral Daily PRN Cosner, Adam, PA-C   0.5 mg at 11/05/17 0411   . losartan (COZAAR) tablet  100 mg Oral Daily Cleatrice Burke, MD   100 mg at 11/09/17 0802   . metoprolol succinate (TOPROL-XL) 24 hr extended release tablet  50 mg Oral Daily Cleatrice Burke, MD   50 mg at 11/09/17 0802   . NS flush syringe  2 mL Intracatheter Q8HRS Cosner, Adam, PA-C   Stopped at 11/09/17 0600    And   . NS flush syringe  2-6 mL  Intracatheter Q1 MIN PRN Cosner, Adam, PA-C   5 mL at 10/31/17 0844   . NS flush syringe  10-30 mL Intracatheter Q8HRS Nicola Police, MD   20 mL at 11/09/17 9381   . NS flush syringe  20-30 mL Intracatheter Q1 MIN PRN Nicola Police, MD   20 mL at 11/01/17 1744   . ondansetron (ZOFRAN) 2 mg/mL injection  4 mg Intravenous Q6H PRN Cosner, Adam, PA-C   4 mg at 11/07/17 1815   . pantoprazole (PROTONIX) delayed release tablet  40 mg Oral Daily before Breakfast Katrina Stack, MD   40 mg at 11/09/17 0606   . polyethylene glycol (MIRALAX) oral packet  17 g Oral Daily Cleatrice Burke, MD   Stopped at 11/08/17 0900   . potassium chloride 51meq per 11mL oral liquid  40 mEq Oral Daily with Breakfast Katrina Stack, MD   Stopped at 11/09/17 0802   . sennosides-docusate sodium (SENOKOT-S) 8.6-50mg  per tablet  1 Tab Oral 2x/day Cleatrice Burke, MD   Stopped at 11/08/17 2100   . simethicone (MYLICON) chewable tablet  80 mg Oral Q6H PRN Katrina Stack, MD   80 mg at 11/09/17 0254   . sodium chloride tablet  1 g Oral 3x/day-Meals Cosner, Adam, PA-C       . SSIP insulin R human (HUMULIN R) 100 units/mL injection  2-6 Units Subcutaneous 4x/day PRN Fonnie Birkenhead, MD   2 Units at 11/09/17 1154         Labs:  I have reviewed all lab values.   CBC   Recent Labs      11/07/17   0514  11/08/17   0428  11/09/17   0400   WBC  10.9  10.7  8.4   HGB  9.1*  8.2*  8.8*   HCT  27.0*  24.4*  26.0*   PLTCNT  268  220  227      Diff   Recent Labs      11/07/17   0514  11/08/17   0428  11/09/17  0400   PMNS  79  86  80   LYMPHOCYTES  5  4  5    MONOCYTES  10  8  9    EOSINOPHIL  5  2  5    BASOPHILS  1  0.10  0  0.04  1  0.10   PMNABS  8.61*  9.22*  6.75   LYMPHSABS  0.55*  0.43*  0.38*   EOSABS  0.55*  0.19  0.44   MONOSABS  1.04*  0.84  0.76        BMP   Recent Labs      11/07/17   0514  11/07/17   1449  11/07/17   1820  11/07/17   2235  11/08/17   0232  11/08/17   0428  11/08/17   0654  11/08/17   1042  11/08/17   1411  11/08/17    1822  11/08/17   2229  11/09/17   0233  11/09/17   0400  11/09/17   0627  11/09/17   1012   SODIUM  118*  119*  119*  120*  120*  119*  121*  123*  124*  122*  125*  123*  123*  124*  123*   POTASSIUM  4.7   --    --    --    --   5.1   --    --    --    --    --    --   4.9   --    --    CHLORIDE  89*   --    --    --    --   93*   --    --    --    --    --    --   97   --    --    CO2  25   --    --    --    --   24   --    --    --    --    --    --   23   --    --    BUN  23   --    --    --    --   23   --    --    --    --    --    --   25   --    --    CREATININE  0.62   --    --    --    --   0.61   --    --    --    --    --    --   0.56   --    --    GLUCOSENF  209*   --    --    --    --   248*   --    --    --    --    --    --   232*   --    --    ANIONGAP  4   --    --    --    --   2*   --    --    --    --    --    --  3*   --    --    BUNCRRATIO  37*   --    --    --    --   38*   --    --    --    --    --    --   45*   --    --    GFR  >59   --    --    --    --   >59   --    --    --    --    --    --   >59   --    --         Urinalysis   No results for input(s): CHARACTER, COLOR, SPECGRAVUR, PHURINE, PROTEIN, GLUCOSE, KETONES, UROBILINOGEN, LEUKOCYTES, NITRITE, EPITHCEL, BACTERIA, MUCOUS, AMORPCRY, CALCIUMUR, CASTS, BILIRUBIN, BLOOD, SQUAEPIT in the last 72 hours.        Imaging:  I have reviewed all imaging studies.         Jennet Maduro, MD 11/09/2017, 12:50  PGY-5  Fellow - Nephrology  [facility]            I saw and examined the patient, personally performed the critical or key portions of the service and discussed patient management with the Nephrology team including the resident,fellow and ancillary staff.  Patient was examined during morning rounds.  I reviewed Dr. Andree Elk note and agree with the history, findings and plan of care.  Exceptions are edited/noted above.  Laboratory, hemodynamic and radiological data were also reviewed and discussed.    Nathaniel Man, MD  Assistant  Professor  Section of Nephrology  Section of [place], [place] 11/09/2017, 12:54

## 2017-11-09 NOTE — Care Plan (Signed)
Problem: Patient Care Overview (Adult,OB)  Goal: Plan of Care Review(Adult,OB)  The patient and/or their representative will communicate an understanding of their plan of care   Outcome: Ongoing (see interventions/notes)  Patient free from falls this shift. Patient shows no new signs/symptoms of skin breakdown. Patient has no complaints of nausea, vomiting, or diarrhea this shift. At the beginning of the shift, patient complained of continuous aching right upper abdominal pain rated 3/10 that is aggravated by activity and alleviated by rest and medications; rest provided at the time. Patient has had no complaints of pain since that time. Patient is getting sodium blood levels checked every 4 hours; levels have been 125 and 123, so far this shift. Assessments completed per Flowsheets. Medications given per MAR. Will continue to monitor and will notify MD as needed.      Goal: Individualization/Patient Specific Goal(Adult/OB)  Outcome: Ongoing (see interventions/notes)

## 2017-11-09 NOTE — Nurses Notes (Addendum)
Paged Cleatrice Burke. Patient states feeling "gassy" this morning. Patient states bloating of abdomen is making her feel short of breath. Respiratory rate 24 breaths per minute. Oxygen saturation 96%. Heart Rate 96 bpm.     Paged returned. Told by Cleatrice Burke patient c/o bloating and abdominal discomfort in morning rounds. Told to administer morning medications to achieve relief of abdominal discomfort. No new orders given at this time. Will continue to monitor.     Member of service at bedside. Told chest x ray will be ordered. Will continue to monitor.

## 2017-11-10 DIAGNOSIS — I493 Ventricular premature depolarization: Secondary | ICD-10-CM

## 2017-11-10 LAB — SODIUM, RANDOM URINE
SODIUM RANDOM URINE: <20 mmol/L
SODIUM RANDOM URINE: <20 mmol/L

## 2017-11-10 LAB — CBC WITH DIFF
BASOPHIL #: 0.09 x10ˆ3/uL (ref 0.00–0.20)
BASOPHIL %: 1 %
EOSINOPHIL #: 0.5 10*3/uL (ref 0.00–0.50)
EOSINOPHIL %: 6 %
HCT: 21.9 % — ABNORMAL LOW (ref 33.5–45.2)
HGB: 7.5 g/dL — ABNORMAL LOW (ref 11.2–15.2)
LYMPHOCYTE #: 0.47 10*3/uL — ABNORMAL LOW (ref 1.00–4.80)
LYMPHOCYTE %: 6 %
MCH: 29.2 pg (ref 27.4–33.0)
MCH: 29.2 pg (ref 27.4–33.0)
MCHC: 34.3 g/dL (ref 32.5–35.8)
MCV: 85.1 fL (ref 78.0–100.0)
MONOCYTE #: 0.77 10*3/uL (ref 0.30–1.00)
MONOCYTE %: 9 %
MPV: 8.1 fL (ref 7.5–11.5)
NEUTROPHIL #: 6.58 10*3/uL (ref 1.50–7.70)
NEUTROPHIL %: 78 %
PLATELETS: 213 10*3/uL (ref 140–450)
RBC: 2.57 10*6/uL — ABNORMAL LOW (ref 3.63–4.92)
RDW: 16.2 % — ABNORMAL HIGH (ref 12.0–15.0)
WBC: 8.4 10*3/uL (ref 3.5–11.0)

## 2017-11-10 LAB — POC BLOOD GLUCOSE (RESULTS)
GLUCOSE, POC: 107 mg/dL — ABNORMAL HIGH (ref 70–105)
GLUCOSE, POC: 91 mg/dL (ref 70–105)
GLUCOSE, POC: 53 mg/dL — ABNORMAL LOW (ref 70–105)
GLUCOSE, POC: 71 mg/dL (ref 70–105)
GLUCOSE, POC: 80 mg/dL (ref 70–105)
GLUCOSE, POC: 91 mg/dL (ref 70–105)
GLUCOSE, POC: 223 mg/dL — ABNORMAL HIGH (ref 70–105)
GLUCOSE, POC: 222 mg/dL — ABNORMAL HIGH (ref 70–105)
GLUCOSE, POC: 132 mg/dL — ABNORMAL HIGH (ref 70–105)
GLUCOSE, POC: 115 mg/dL — ABNORMAL HIGH (ref 70–105)

## 2017-11-10 LAB — HEPATIC FUNCTION PANEL
ALBUMIN: 1.4 g/dL — ABNORMAL LOW (ref 3.4–4.8)
ALKALINE PHOSPHATASE: 75 U/L (ref <150)
ALT (SGPT): 5 U/L (ref <55)
AST (SGOT): 14 U/L (ref 8–41)
BILIRUBIN DIRECT: 0.1 mg/dL (ref <0.3)
BILIRUBIN TOTAL: 0.2 mg/dL — ABNORMAL LOW (ref 0.3–1.3)

## 2017-11-10 LAB — BASIC METABOLIC PANEL
ANION GAP: 1 mmol/L — ABNORMAL LOW (ref 4–13)
BUN/CREA RATIO: 50 — ABNORMAL HIGH (ref 6–22)
BUN: 26 mg/dL — ABNORMAL HIGH (ref 8–25)
CALCIUM: 8 mg/dL — ABNORMAL LOW (ref 8.5–10.2)
CHLORIDE: 101 mmol/L (ref 96–111)
CO2 TOTAL: 22 mmol/L (ref 22–32)
CREATININE: 0.52 mg/dL (ref 0.49–1.10)
ESTIMATED GFR: >59 mL/min/{1.73_m2} (ref >59)
GLUCOSE: 142 mg/dL — ABNORMAL HIGH (ref 65–139)
POTASSIUM: 4.5 mmol/L (ref 3.5–5.1)
SODIUM: 124 mmol/L — ABNORMAL LOW (ref 136–145)

## 2017-11-10 LAB — OSMOLALITY, RANDOM URINE: OSMOLALITY URINE: 474 mOsm/kg (ref 50–1400)

## 2017-11-10 LAB — PHOSPHORUS: PHOSPHORUS: 3.7 mg/dL (ref 2.3–4.0)

## 2017-11-10 LAB — SODIUM
SODIUM: 124 mmol/L — ABNORMAL LOW (ref 136–145)
SODIUM: 122 mmol/L — ABNORMAL LOW (ref 136–145)
SODIUM: 127 mmol/L — ABNORMAL LOW (ref 136–145)

## 2017-11-10 MED ORDER — MAGNESIUM SULFATE 2 GRAM/50 ML (4 %) IN WATER INTRAVENOUS PIGGYBACK
2.0000 g | INJECTION | Freq: Once | INTRAVENOUS | Status: AC
Start: 2017-11-10 — End: 2017-11-10
  Administered 2017-11-10: 0 g via INTRAVENOUS
  Administered 2017-11-10: 2 g via INTRAVENOUS
  Filled 2017-11-10: qty 50

## 2017-11-10 MED ORDER — SODIUM CHLORIDE 0.9 % INTRAVENOUS SOLUTION
INTRAVENOUS | Status: DC
Start: 2017-11-10 — End: 2017-11-12

## 2017-11-10 MED ORDER — TRACE ELEMENTS CR-CU-MN-SE-ZN 10 MCG-1 MG-0.5 MG-60 MCG-5MG/ML IV SOLN
INTRAVENOUS | Status: AC
Start: 2017-11-10 — End: 2017-11-11
  Filled 2017-11-10: qty 950

## 2017-11-10 MED ORDER — SODIUM CHLORIDE 1 GRAM TABLET
2.00 g | ORAL_TABLET | Freq: Three times a day (TID) | ORAL | Status: DC
Start: 2017-11-10 — End: 2017-11-12
  Administered 2017-11-10 – 2017-11-12 (×6): 2 g via ORAL
  Filled 2017-11-10 (×8): qty 2

## 2017-11-10 MED ADMIN — magnesium sulfate 2 gram/50 mL (4 %) in water intravenous piggyback: INTRAVENOUS | @ 09:00:00

## 2017-11-10 MED ADMIN — electrolyte-A intravenous solution: @ 07:00:00

## 2017-11-10 MED ADMIN — insulin regular human 100 unit/mL injection solution: SUBCUTANEOUS | @ 12:00:00

## 2017-11-10 MED ADMIN — nicotine 21 mg/24 hr daily transdermal patch: INTRAVENOUS | @ 08:00:00

## 2017-11-10 NOTE — Care Management Notes (Signed)
[facility]  Care Management Note    Patient Name: [name]  Date of Birth: [birth year]  Sex: female  Date/Time of Admission: 10/25/2017  5:34 AM  Room/Bed: 975/A  Payor: MEDICARE / Plan: MEDICARE PART A AND B / Product Type: Medicare /    LOS: 16 days   PCP: No Pcp    Admitting Diagnosis:  Pancreatic cancer (CMS [place]) [C25.9]    Assessment:      11/10/17 1238   Assessment Details   Assessment Type Continued Assessment   Date of Care Management Update 11/10/17   Date of Next DCP Update 11/13/17   Care Management Plan   Discharge Planning Status plan in progress   Projected Discharge Date 11/15/17   CM will evaluate for rehabilitation potential yes   Patient choice offered to patient/family yes   Form for patient choice reviewed/signed and on chart no   Facility or [facility] Swing Bed   Patient aware of possible cost for ambulance transport?  Yes   Discharge Needs Assessment   Discharge Facility/Level Of Care Needs Swing Bed-Hospital Based (code 55)   Transportation Available ambulance       Discharge Plan:  Swing Bed-Hospital Based (code 22)  Per service plan for swing bed once available but not medically stable for discharge today.  Patient to have nephrology consult today due to Hyponatremia.  [place] spoke with Ebony Hail at [facility] who states her pharmacist has approved patients TPN but will need to order it and it should be available Monday.  Ebony Hail states they want TPN to be in house for patient so would like to accept patient Tuesday 11/14/17 for admission.  Ebony Hail also states will need to review patient medications prior.  [facility] faxed medication list to facility.  Ebony Hail states no PAS needs to be completed patient needs a 3 day inpatient stay.  Service, patient, sister updated.  [facility] to follow up next week for discharge to swing bed.    The patient will continue to be evaluated for developing discharge needs.     Case Manager: Frederik Pear, RN  Phone:  [phone]

## 2017-11-10 NOTE — Consults (Signed)
Nephrology Consult   Follow Up Note      [name],[name], 71 y.o. female  Date of Admission:  10/25/2017  Date of service: 11/10/2017  Date of Birth:  [birth year]    Assessment:  [name] is a 71 y.o. female with hx of pancreatic cancer s/p robotic Whipple on 11/14 with hyponatremia    Hyponatremia  - likely due to thiazide diuretic + low solute intake post surgery. 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   - Urine studies now suggestive of volume depletion.     Recommendations:  - Resume NS at 75 ml/hr  - Increase salt tabs to 2 g tid   - Agree with decreasing free water in parenteral nutrition  - Goal sodium for tomorrow morning will be 130. Please call if Na less than 120 or greater than 132.    Thank you for consulting Nephrology. Please call with any questions or concerns. We will continue to follow.    Subjective: Seems more sleepy today.       Objective:  Physical Exam  Filed Vitals:    11/09/17 2327 11/10/17 0410 11/10/17 0759 11/10/17 0827   BP:  (!) 144/62 (!) 110/48 (!) 101/48   Pulse:   78 72   Resp:   20 (!) 32   Temp: 36.9 C (98.4 F) 37 C (98.6 F) 36.3 C (97.4 F)    SpO2:   96% 98%     General: No apparent distress  Mouth:  Moist mucous membranes  Neck:  Supple, trachea midline, no JVD  Lungs:  CTAB, no wheezing  Heart:  RRR, no murmurs  Abdomen: Soft, tender in LLQ  Extremities: No cyanosis, no edema  Neuro:  Grossly normal  Skin:  Warm and dry      I/O last 24 hours:      Intake/Output Summary (Last 24 hours) at 11/10/17 1138  Last data filed at 11/10/17 0800   Gross per 24 hour   Intake             2350 ml   Output             1940 ml   Net              410 ml         Current Facility-Administered Medications   Medication Dose Route Frequency Provider Last Rate Last Dose   . acetaminophen (TYLENOL) tablet  650 mg Oral Q4H PRN Leida Lauth, MD   650 mg at 11/07/17 2219   . adult custom variable rate parenteral nutrition   Intravenous Continuous Cosner, Adam,  PA-C       . adult custom variable rate parenteral nutrition   Intravenous Continuous Budi, Stevan, MD       . atorvastatin (LIPITOR) tablet  10 mg Oral QPM Cleatrice Burke, MD   10 mg at 11/09/17 2018   . calcium carbonate (TUMS) chewable tablet  500 mg Oral Daily Josetta Huddle, MD   500 mg at 11/10/17 0836   . docusate sodium (COLACE) capsule  100 mg Oral 2x/day Cleatrice Burke, MD   Stopped at 11/07/17 2100   . enoxaparin PF (LOVENOX) 40 mg/0.4 mL SubQ injection  40 mg Subcutaneous Q24H Cosner, Adam, PA-C   40 mg at 11/10/17 0641   . hydrALAZINE (APRESOLINE) injection 10 mg  10 mg Intravenous Q4H PRN Tereasa Coop, MD   10 mg at 11/05/17 2317   . HYDROcodone-acetaminophen (NORCO) 5-325 mg per  tablet  1 Tab Oral Q4H PRN Katrina Stack, MD   1 Tab at 11/09/17 2333   . insulin NPH human 100 units/mL injection  50 Units Subcutaneous QPM Shela Commons, MD   50 Units at 11/09/17 2013   . insulin R human 100 units/mL injection  6 Units Subcutaneous 3x/day Laurier Nancy, MD   6 Units at 11/10/17 [phone]   . insulin R human 100 units/mL injection  10 Units Subcutaneous Daily Shela Commons, MD   10 Units at 11/09/17 2012   . LORazepam (ATIVAN) tablet  0.5 mg Oral Daily PRN Cosner, Adam, PA-C   0.5 mg at 11/05/17 0411   . losartan (COZAAR) tablet  100 mg Oral Daily Cleatrice Burke, MD   Stopped at 11/10/17 0900   . metoprolol succinate (TOPROL-XL) 24 hr extended release tablet  50 mg Oral Daily Cleatrice Burke, MD   Stopped at 11/10/17 0900   . NS flush syringe  2 mL Intracatheter Q8HRS Cosner, Adam, PA-C   2 mL at 11/10/17 4193    And   . NS flush syringe  2-6 mL Intracatheter Q1 MIN PRN Cosner, Adam, PA-C   5 mL at 10/31/17 0844   . NS flush syringe  10-30 mL Intracatheter Q8HRS Nicola Police, MD   10 mL at 11/10/17 7902   . NS flush syringe  20-30 mL Intracatheter Q1 MIN PRN Nicola Police, MD   20 mL at 11/01/17 1744   . NS premix infusion   Intravenous Continuous Budi, Stevan, MD 75  mL/hr at 11/10/17 1024     . ondansetron (ZOFRAN) 2 mg/mL injection  4 mg Intravenous Q6H PRN Cosner, Adam, PA-C   4 mg at 11/07/17 1815   . pantoprazole (PROTONIX) delayed release tablet  40 mg Oral Daily before Breakfast Katrina Stack, MD   40 mg at 11/10/17 4097   . polyethylene glycol (MIRALAX) oral packet  17 g Oral Daily Cleatrice Burke, MD   Stopped at 11/08/17 0900   . potassium chloride 3meq per 71mL oral liquid  40 mEq Oral Daily with Breakfast Katrina Stack, MD   40 mEq at 11/10/17 0836   . sennosides-docusate sodium (SENOKOT-S) 8.6-50mg  per tablet  1 Tab Oral 2x/day Cleatrice Burke, MD   Stopped at 11/08/17 2100   . simethicone (MYLICON) chewable tablet  80 mg Oral Q6H PRN Katrina Stack, MD   80 mg at 11/10/17 0843   . sodium chloride tablet  2 g Oral 3x/day-Meals Budi, Stevan, MD       . SSIP insulin R human (HUMULIN R) 100 units/mL injection  2-6 Units Subcutaneous 4x/day PRN Fonnie Birkenhead, MD   4 Units at 11/09/17 2012         Labs:  I have reviewed all lab values.   CBC   Recent Labs      11/08/17   0428  11/09/17   0400  11/10/17   0449   WBC  10.7  8.4  8.4   HGB  8.2*  8.8*  7.5*   HCT  24.4*  26.0*  21.9*   PLTCNT  220  227  213      Diff   Recent Labs      11/08/17   0428  11/09/17   0400  11/10/17   0449   PMNS  86  80  78   LYMPHOCYTES  4  5  6    MONOCYTES  8  9  9    EOSINOPHIL  2  5  6    BASOPHILS  0  0.04  1  0.10  1  0.09   PMNABS  9.22*  6.75  6.58   LYMPHSABS  0.43*  0.38*  0.47*   EOSABS  0.19  0.44  0.50   MONOSABS  0.84  0.76  0.77        BMP   Recent Labs      11/07/17   1449  11/07/17   1820  11/07/17   2235  11/08/17   0232  11/08/17   0428  11/08/17   0654  11/08/17   1042  11/08/17   1411  11/08/17   1822  11/08/17   2229  11/09/17   0233  11/09/17   0400  11/09/17   0627  11/09/17   1012  11/09/17   1724  11/09/17   2155  11/10/17   0449   SODIUM  119*  119*  120*  120*  119*  121*  123*  124*  122*  125*  123*  123*  124*  123*  123*  123*  124*  124*   POTASSIUM    --    --    --    --   5.1   --    --    --    --    --    --   4.9   --    --    --    --   4.5   CHLORIDE   --    --    --    --   57*   --    --    --    --    --    --   97   --    --    --    --   101   CO2   --    --    --    --   24   --    --    --    --    --    --   23   --    --    --    --   22   BUN   --    --    --    --   23   --    --    --    --    --    --   25   --    --    --    --   26*   CREATININE   --    --    --    --   0.61   --    --    --    --    --    --   0.56   --    --    --    --   0.52   GLUCOSENF   --    --    --    --   248*   --    --    --    --    --    --   232*   --    --    --    --   142*   ANIONGAP   --    --    --    --  2*   --    --    --    --    --    --   3*   --    --    --    --   1*   BUNCRRATIO   --    --    --    --   38*   --    --    --    --    --    --   45*   --    --    --    --   50*   GFR   --    --    --    --   >59   --    --    --    --    --    --   >59   --    --    --    --   >59        Urinalysis   No results for input(s): CHARACTER, COLOR, SPECGRAVUR, PHURINE, PROTEIN, GLUCOSE, KETONES, UROBILINOGEN, LEUKOCYTES, NITRITE, EPITHCEL, BACTERIA, MUCOUS, AMORPCRY, CALCIUMUR, CASTS, BILIRUBIN, BLOOD, SQUAEPIT in the last 72 hours.        Imaging:  I have reviewed all imaging studies.         Jennet Maduro, MD 11/10/2017, 11:38  PGY-5  Fellow - Nephrology  [facility]            I saw and examined the patient, personally performed the critical or key portions of the service and discussed patient management with the Nephrology team including the resident,fellow and ancillary staff.  Patient was examined during morning rounds.  I reviewed Dr. Andree Elk note and agree with the history, findings and plan of care.  Exceptions are edited/noted above.  Laboratory, hemodynamic and radiological data were also reviewed and discussed.    Nathaniel Man, MD  Assistant Professor  Section of Nephrology  Section of [place], [place] 11/10/2017, 13:50

## 2017-11-10 NOTE — Nurses Notes (Addendum)
Skin laceration found on patient's upper left quadrant to be reddened. Cleatrice Burke paged. Cleatrice Burke to come to bedside to evaluate.     Cleatrice Burke at bedside. Wound believed to be within normal limits. Will continue to monitor.

## 2017-11-10 NOTE — Nurses Notes (Addendum)
0730: Patient found to be laying in bed diaphoretic. Cold and clammy hands. States that she "hasn't been feeling well since last belly shot." TPN found to not be infusing. Fingerstick obtained with result of 53. Service paged. Awaiting response for orders. Will continue to monitor.     7948: Paged returned. Told to restart TPN; check fingerstick in one hour. TPN to be restarted and fingerstick to be obtained. Will continue to monitor.    0745: Fingerstick 71.    0165: Fingerstick 80.    0828: Fingerstick 91. Service made aware. Will continue to monitor.

## 2017-11-10 NOTE — Progress Notes (Signed)
[facility]  Surgical Oncology Progress Note    [name],[name], 71 y.o. female  Date of Birth:  Mar 15, 1946  Date of Admission:  10/25/2017  Date of service: 11/10/2017     Subjective: No acute events overnight.  Patient is doing well this morning.  Patient is tolerating diet and having bowel function.    Physical Exam:  BP (!) 110/48  Pulse 78  Temp 36.3 C (97.4 F)  Resp 20  Ht 1.549 m (5\' 1" )  Wt 92.7 kg (204 lb 5.9 oz)  SpO2 96%  BMI 38.61 kg/m2    General: Appears in no acute distress, appears as stated age   Eyes: Conjunctiva clear., Pupils equal and round.   HENT:ENMT without erythema or injection, mucous membranes moist.  Lungs: Even and nonlabored respiration  Cardiovascular: regular rate and rhythm  Abdomen: Soft, non-tender, non-distended, drain is intact and draining serous fluid  Extremities: grossly moving all extremities  Skin: Skin warm and dry    I/O:    Date 11/09/17 0700 - 11/10/17 0659 11/10/17 0700 - 11/11/17 0659   Shift 0700-1459 1500-2259 2300-0659 24 Hour Total 0700-1459 1500-2259 2300-0659 24 Hour Total   I  N  T  A  K  E   P.O. 1300 539 754 4767 240   240      Oral 1300 539 754 4767 240   240    I.V.  (mL/kg/hr) 545  (0.75) 270  (0.37) 840  (1.13) 1655  (0.74)          Volume (adult custom variable rate parenteral nutrition) 195   195          Volume (NS premix infusion) 350   350          Volume (adult custom variable rate parenteral nutrition)  [id number]        Shift Total  (mL/kg) 1845  (20.41) 450  (4.98) 960  (10.36) 3255  (35.11) 240  (2.59)   240  (2.59)   O  U  T  P  U  T   Urine  (mL/kg/hr) 500  (0.69) 600  (0.83) 600  (0.81) 1700  (0.76)          Urine (Voided) 500 [phone]          Urine Occurrence  0 x  0 x        Emesis              Emesis Occurrence 0 x   0 x        Drains 110 170 210 490 110   110      Drain Output (Drain (Miscellaneous) JP Right Abdomen) 110 170 210 490 110   110    Stool              Stool Occurrence 0 x 1 x  1 x         Shift Total  (mL/kg) 610  (6.75) 770  (8.52) 810  (8.74) 2190  (23.62) 110  (1.19)   110  (1.19)   Weight (kg) 90.4 90.4 92.7 92.7 92.7 92.7 92.7 92.7       Nutrition/Residuals:  MNT PROTOCOL FOR DIETITIAN  DIETARY ORAL SUPPLEMENTS Oral Supplements with tray: Ensure Clear-Apple; BREAKFAST/LUNCH/DINNER; 1 Can  DIET CLEAR LIQUID  adult custom variable rate parenteral nutrition    Labs  Reviewed:   Lab Results Today:    Results for orders placed or performed during the  hospital encounter of 10/25/17 (from the past 24 hour(s))   SODIUM, RANDOM URINE   Result Value Ref Range    SODIUM RANDOM URINE 35 No Reference Range Established mmol/L   OSMOLALITY, RANDOM URINE   Result Value Ref Range    OSMOLALITY URINE 612 50 - 1400 mOsm/kg   POC BLOOD GLUCOSE (RESULTS)   Result Value Ref Range    GLUCOSE, POC 216 (H) 70 - 105 mg/dL   SODIUM   Result Value Ref Range    SODIUM 123 (L) 136 - 145 mmol/L   POC BLOOD GLUCOSE (RESULTS)   Result Value Ref Range    GLUCOSE, POC 171 (H) 70 - 105 mg/dL   OSMOLALITY, RANDOM URINE   Result Value Ref Range    OSMOLALITY URINE 654 50 - 1400 mOsm/kg   SODIUM, RANDOM URINE   Result Value Ref Range    SODIUM RANDOM URINE <20 No Reference Range Established mmol/L   POC BLOOD GLUCOSE (RESULTS)   Result Value Ref Range    GLUCOSE, POC 107 (H) 70 - 105 mg/dL   SODIUM   Result Value Ref Range    SODIUM 123 (L) 136 - 145 mmol/L   POC BLOOD GLUCOSE (RESULTS)   Result Value Ref Range    GLUCOSE, POC 219 (H) 70 - 105 mg/dL   SODIUM   Result Value Ref Range    SODIUM 123 (L) 136 - 145 mmol/L   POC BLOOD GLUCOSE (RESULTS)   Result Value Ref Range    GLUCOSE, POC 92 70 - 105 mg/dL   SODIUM   Result Value Ref Range    SODIUM 124 (L) 136 - 145 mmol/L   BASIC METABOLIC PANEL   Result Value Ref Range    SODIUM 124 (L) 136 - 145 mmol/L    POTASSIUM 4.5 3.5 - 5.1 mmol/L    CHLORIDE 101 96 - 111 mmol/L    CO2 TOTAL 22 22 - 32 mmol/L    ANION GAP 1 (L) 4 - 13 mmol/L    CALCIUM 8.0 (L) 8.5 - 10.2 mg/dL    GLUCOSE 142  (H) 65 - 139 mg/dL    BUN 26 (H) 8 - 25 mg/dL    CREATININE 0.52 0.49 - 1.10 mg/dL    BUN/CREA RATIO 50 (H) 6 - 22    ESTIMATED GFR >59 >59 mL/min/1.50m^2   MAGNESIUM   Result Value Ref Range    MAGNESIUM 1.6 1.6 - 2.5 mg/dL   PHOSPHORUS   Result Value Ref Range    PHOSPHORUS 3.7 2.3 - 4.0 mg/dL   HEPATIC FUNCTION PANEL   Result Value Ref Range    ALBUMIN 1.4 (L) 3.4 - 4.8 g/dL    ALKALINE PHOSPHATASE 75 <150 U/L    ALT (SGPT) 5 <55 U/L    AST (SGOT) 14 8 - 41 U/L    BILIRUBIN TOTAL 0.2 (L) 0.3 - 1.3 mg/dL    BILIRUBIN DIRECT 0.1 <0.3 mg/dL    PROTEIN TOTAL 3.8 (L) 6.0 - 8.0 g/dL   CBC WITH DIFF   Result Value Ref Range    WBC 8.4 3.5 - 11.0 x10^3/uL    RBC 2.57 (L) 3.63 - 4.92 x10^6/uL    HGB 7.5 (L) 11.2 - 15.2 g/dL    HCT 21.9 (L) 33.5 - 45.2 %    MCV 85.1 78.0 - 100.0 fL    MCH 29.2 27.4 - 33.0 pg    MCHC 34.3 32.5 - 35.8 g/dL    RDW 16.2 (  H) 12.0 - 15.0 %    PLATELETS 213 140 - 450 x10^3/uL    MPV 8.1 7.5 - 11.5 fL    NEUTROPHIL % 78 %    LYMPHOCYTE % 6 %    MONOCYTE % 9 %    EOSINOPHIL % 6 %    BASOPHIL % 1 %    NEUTROPHIL # 6.58 1.50 - 7.70 x10^3/uL    LYMPHOCYTE # 0.47 (L) 1.00 - 4.80 x10^3/uL    MONOCYTE # 0.77 0.30 - 1.00 x10^3/uL    EOSINOPHIL # 0.50 0.00 - 0.50 x10^3/uL    BASOPHIL # 0.09 0.00 - 0.20 x10^3/uL   POC BLOOD GLUCOSE (RESULTS)   Result Value Ref Range    GLUCOSE, POC 91 70 - 105 mg/dL     Radiology  No new imaging is available for review    Assessment/ Plan:      Active Hospital Problems    Diagnosis   . Primary Problem: Pancreatic cancer (CMS [facility])       This is a 71 y.o. female who is POD 16 s/p robotic whipple for pancreatic cancer    - Continue with TPN, clear liquid  - Continue with current pain control  - Continue with bowel regimen  - Appreciate nephro's recs, patient is on salt tabs per rect  - Appreciate endocrine's recs, appropriate insulin dosage has been ordered   - Dispo: SNF pending    Maryelizabeth Kaufmann, MD  11/10/2017, 08:22

## 2017-11-10 NOTE — Care Plan (Signed)
Problem: Patient Care Overview (Adult,OB)  Goal: Plan of Care Review(Adult,OB)  The patient and/or their representative will communicate an understanding of their plan of care   Outcome: Ongoing (see interventions/notes)    Goal: Individualization/Patient Specific Goal(Adult/OB)  Outcome: Ongoing (see interventions/notes)    Goal: Interdisciplinary Rounds/Family Conf  Outcome: Ongoing (see interventions/notes)      Problem: Perioperative Period (Adult)  Prevent and manage potential problems including:1. bleeding2. gastrointestinal complications3. hypothermia4. infection5. pain6. perioperative injury7. respiratory compromise8. situational response9. urinary retention10. venous [id number]. wound complications   Goal: Signs and Symptoms of Listed Potential Problems Will be Absent or Manageable (Perioperative Period)  Signs and symptoms of listed potential problems will be absent or manageable by discharge/transition of care (reference Perioperative Period (Adult) CPG).   Outcome: Ongoing (see interventions/notes)      Problem: Nutrition, Imbalanced: Inadequate Oral Intake (Adult)  Goal: Improved Oral Intake  Patient will demonstrate the desired outcomes by discharge/transition of care.   Outcome: Ongoing (see interventions/notes)    Goal: Prevent Further Weight Loss  Patient will demonstrate the desired outcomes by discharge/transition of care.   Outcome: Ongoing (see interventions/notes)      Problem: Nutrition, Parenteral (Adult)  Prevent and manage potential problems including:  1. fluid/electrolyte imbalance  2. glycemic control impaired  3. infection  4. infusion-related complications  5. malnutrition   Goal: Signs and Symptoms of Listed Potential Problems Will be Absent, Minimized or Managed (Nutrition, Parenteral)  Signs and symptoms of listed potential problems will be absent, minimized or managed by discharge/transition of care (reference Nutrition, Parenteral (Adult) CPG).   Outcome: Ongoing (see  interventions/notes)    Goal: Signs and Symptoms of Listed Potential Problems Will be Absent, Minimized or Managed (Nutrition, Parenteral)  Signs and symptoms of listed potential problems will be absent, minimized or managed by discharge/transition of care (reference Nutrition, Parenteral (Adult) CPG).   Outcome: Ongoing (see interventions/notes)      Comments: Patient is calm and cooperative. Alert and oriented x 4. Vital signs stable and assessed per flowsheet. Medications administered per MAR; PRN medications used as needed. 1000 mL fluid restriction followed per orders. TPN infusion complete and blood sugar checked with coverage given as needed. Clarified with service. Acceptable to give scheduled and coverage per orders at this time. Fingersticks obtained as ordered throughout shift. Blood pressure monitored per orders due to 0800 cardiac medications held due to low blood pressure. Service updated of most recent set of vital signs per flowsheet. No changes in orders at this time. Patient educated about benefits of frequent ambulation. States she will "wait until less pain." Will reassess again. Fall precautions maintained with non skid socks on while patient out of bed and sitter select under patient while in bed. Patient instructed on call do not fall program. States she understands. Incisions and drains maintained per orders. Dressing change on abdomen tolerated well with no complaints of pain from patient. Double lumen PICC flushes and draws with ordered maintenance fluids infusing per MAR. Sodium draws of both blood and and urine obtained and sent to labs per order. Will continue to monitor.

## 2017-11-10 NOTE — Nurses Notes (Addendum)
Paged returned by service. Patient's BP 101/48 with MAP of 62, HR 78. Told to hold Losartan and give 25 MG of Metoprolol.     Paged returned by service. Told to hold Metoprolol. Told to monitor BP. Will continue to monitor.

## 2017-11-10 NOTE — Nurses Notes (Addendum)
Stevan Budi at bedside. Told to administer Metoprolol. Will administer. Will continue to monitor.

## 2017-11-11 LAB — MANUAL DIFFERENTIAL (CELLAVISION)
BASOPHIL #: 0.09 x10ˆ3/uL (ref 0.00–0.20)
BASOPHIL %: 1 %
EOSINOPHIL #: 0.44 x10ˆ3/uL (ref 0.00–0.50)
EOSINOPHIL %: 5 %
LYMPHOCYTE #: 0.62 10*3/uL — ABNORMAL LOW (ref 1.00–4.80)
LYMPHOCYTE %: 7 %
METAMYELOCYTES: 1 % (ref 0–2)
MONOCYTE #: 0.53 x10ˆ3/uL (ref 0.30–1.00)
MONOCYTE %: 6 %
NEUTROPHIL #: 6.85 x10ˆ3/uL (ref 1.50–7.70)
NEUTROPHIL %: 80 %

## 2017-11-11 LAB — BASIC METABOLIC PANEL
ANION GAP: 2 mmol/L — ABNORMAL LOW (ref 4–13)
BUN/CREA RATIO: 51 — ABNORMAL HIGH (ref 6–22)
BUN: 27 mg/dL — ABNORMAL HIGH (ref 8–25)
CALCIUM: 8.2 mg/dL — ABNORMAL LOW (ref 8.5–10.2)
CHLORIDE: 105 mmol/L (ref 96–111)
CO2 TOTAL: 19 mmol/L — ABNORMAL LOW (ref 22–32)
CREATININE: 0.53 mg/dL (ref 0.49–1.10)
ESTIMATED GFR: >59 mL/min/1.73mˆ2 (ref >59)
GLUCOSE: 124 mg/dL (ref 65–139)
POTASSIUM: 4.4 mmol/L (ref 3.5–5.1)
SODIUM: 126 mmol/L — ABNORMAL LOW (ref 136–145)

## 2017-11-11 LAB — HEPATIC FUNCTION PANEL
ALBUMIN: 1.4 g/dL — ABNORMAL LOW (ref 3.4–4.8)
ALKALINE PHOSPHATASE: 79 U/L (ref <150)
AST (SGOT): 15 U/L (ref 8–41)
BILIRUBIN DIRECT: 0.1 mg/dL (ref <0.3)

## 2017-11-11 LAB — SODIUM: SODIUM: 128 mmol/L — ABNORMAL LOW (ref 136–145)

## 2017-11-11 LAB — POC BLOOD GLUCOSE (RESULTS)
GLUCOSE, POC: 145 mg/dL — ABNORMAL HIGH (ref 70–105)
GLUCOSE, POC: 103 mg/dL (ref 70–105)
GLUCOSE, POC: 101 mg/dL (ref 70–105)
GLUCOSE, POC: 69 mg/dL — ABNORMAL LOW (ref 70–105)
GLUCOSE, POC: 94 mg/dL (ref 70–105)

## 2017-11-11 LAB — CBC WITH DIFF
HCT: 22.2 % — ABNORMAL LOW (ref 33.5–45.2)
HGB: 7.5 g/dL — ABNORMAL LOW (ref 11.2–15.2)
MCH: 29.3 pg (ref 27.4–33.0)
MCHC: 33.8 g/dL (ref 32.5–35.8)
MCV: 86.8 fL (ref 78.0–100.0)
MPV: 8.1 fL (ref 7.5–11.5)
PLATELETS: 238 10*3/uL (ref 140–450)
RBC: 2.56 x10ˆ6/uL — ABNORMAL LOW (ref 3.63–4.92)
RDW: 16.6 % — ABNORMAL HIGH (ref 12.0–15.0)
WBC: 8.6 10*3/uL (ref 3.5–11.0)

## 2017-11-11 LAB — ECG 12-LEAD
Atrial Rate: 82 {beats}/min
Calculated T Axis: 82 degrees
PR Interval: 184 ms
QT Interval: 378 ms
QTC Calculation: 441 ms

## 2017-11-11 LAB — PHOSPHORUS: PHOSPHORUS: 3.6 mg/dL (ref 2.3–4.0)

## 2017-11-11 LAB — MAGNESIUM: MAGNESIUM: 1.6 mg/dL (ref 1.6–2.5)

## 2017-11-11 MED ORDER — DEXTROSE 50 % IN WATER (D50W) INTRAVENOUS SYRINGE
12.5000 g | INJECTION | Freq: Once | INTRAVENOUS | Status: AC
Start: 2017-11-11 — End: 2017-11-11
  Administered 2017-11-11: 25 mL via INTRAVENOUS

## 2017-11-11 MED ORDER — TRACE ELEMENTS CR-CU-MN-SE-ZN 10 MCG-1 MG-0.5 MG-60 MCG-5MG/ML IV SOLN
INTRAVENOUS | Status: AC
Start: 2017-11-11 — End: 2017-11-12
  Filled 2017-11-11: qty 950

## 2017-11-11 MED ADMIN — sodium chloride 0.9 % intravenous solution: SUBCUTANEOUS | @ 07:00:00 | NDC 00338004904

## 2017-11-11 MED ADMIN — white petrolatum 41 % topical ointment: @ 06:00:00 | NDC 7214045231

## 2017-11-11 MED ADMIN — vit E-glycerin-dimethicone lotion: INTRAVENOUS | @ 22:00:00 | NDC 0299391808

## 2017-11-11 NOTE — Care Plan (Signed)
Problem: Patient Care Overview (Adult,OB)  Goal: Plan of Care Review(Adult,OB)  The patient and/or their representative will communicate an understanding of their plan of care   Outcome: Ongoing (see interventions/notes)  POC reviewed with patient. Educated on current medications with patient verbalizing understanding. Complaint with fall prevention with return demonstration of call bell use. Infection control strategies in place with visitor's advised to wash hands prior to entering patient's room and patient was instructed in proper hand hygiene following tolieting. Patient educated on diet, pain control the patient verbalizes understanding. Ongoing progress towards care plan goals. Will continue to monitor and work toward discharge.    Goal: Individualization/Patient Specific Goal(Adult/OB)  Outcome: Ongoing (see interventions/notes)    Goal: Interdisciplinary Rounds/Family Conf  Outcome: Ongoing (see interventions/notes)      Problem: Nutrition, Imbalanced: Inadequate Oral Intake (Adult)  Goal: Prevent Further Weight Loss  Patient will demonstrate the desired outcomes by discharge/transition of care.   Outcome: Ongoing (see interventions/notes)      Problem: Nutrition, Parenteral (Adult)  Prevent and manage potential problems including:  1. fluid/electrolyte imbalance  2. glycemic control impaired  3. infection  4. infusion-related complications  5. malnutrition   Goal: Signs and Symptoms of Listed Potential Problems Will be Absent, Minimized or Managed (Nutrition, Parenteral)  Signs and symptoms of listed potential problems will be absent, minimized or managed by discharge/transition of care (reference Nutrition, Parenteral (Adult) CPG).   Outcome: Ongoing (see interventions/notes)    Goal: Signs and Symptoms of Listed Potential Problems Will be Absent, Minimized or Managed (Nutrition, Parenteral)  Signs and symptoms of listed potential problems will be absent, minimized or managed by discharge/transition of  care (reference Nutrition, Parenteral (Adult) CPG).   Outcome: Ongoing (see interventions/notes)      Problem: Diarrhea (Adult)  Goal: Improved/Reduced Symptoms  Patient will demonstrate the desired outcomes by discharge/transition of care.   Outcome: Ongoing (see interventions/notes)

## 2017-11-11 NOTE — Nurses Notes (Signed)
Dilaudid PCA discontinued per MD order, 96 mL wasted with Kermit Balo, RN.

## 2017-11-11 NOTE — Nurses Notes (Signed)
Results for [name], [name] (MRN [id number]) as of 11/11/2017 21:35   Ref. Range 11/11/2017 21:19   GLUCOSE, POC Latest Ref Range: 70 - 105 mg/dL 69 (L)   Service notified, orders received for 50% dextrose. Will recheck in 15 mins

## 2017-11-11 NOTE — Nurses Notes (Signed)
Patient straight cathed per order. Approximately 147ml clear yellow urine obtained before catheter was forcefully expelled, with patient continuing to void spontaneously. Large amount of urine voided. Pericare provided. Post void bladder scan estimates 31 mL retained.

## 2017-11-11 NOTE — Progress Notes (Signed)
[facility]  Surgical Oncology Progress Note    [name],[name], 71 y.o. female  Date of Birth:  1946/07/30  Date of Admission:  10/25/2017  Date of service: 11/11/2017     Subjective: No acute events overnight.  Patient is doing well this morning.      Physical Exam:  BP (!) 122/48 Comment: RN notified  Pulse 100  Temp 36.7 C (98.1 F)  Resp 20  Ht 1.549 m (5\' 1" )  Wt 95.1 kg (209 lb 10.5 oz)  SpO2 96%  BMI 39.61 kg/m2    General: Appears in no acute distress, appears as stated age   Eyes: Conjunctiva clear., Pupils equal and round.   HENT:ENMT without erythema or injection, mucous membranes moist.  Lungs: Even and nonlabored respiration  Cardiovascular: regular rate and rhythm  Abdomen: Soft, non-tender, non-distended, drain is intact and draining serous fluid  Extremities: grossly moving all extremities  Skin: Skin warm and dry    I/O:    Date 11/10/17 1500 - 11/11/17 0659 11/11/17 0700 - 11/12/17 0659   Shift 1500-2259 2300-0659 24 Hour Total 0700-1459 1500-2259 2300-0659 24 Hour Total   I  N  T  A  K  E   P.O. 1730 290 2740 120   120      Oral 1730 290 2740 120   120    I.V.  (mL/kg/hr) 945  (1.27) 1720  (2.32) 3265  (1.47) 590  (0.78)   590      Volume (NS premix infusion) [id number] 525   525      Volume (adult custom variable rate parenteral nutrition) 345 1120 1465 65   65    Shift Total  (mL/kg) 2675  (28.86) 2010  (21.68) 6005  (64.78) 710  (7.47)   710  (7.47)   O  U  T  P  U  T   Urine  (mL/kg/hr) 450  (0.61) 500  (0.67) 950  (0.43)          Urine (Voided) 450  450          Straight Cath Urine  500 500          Urine Occurrence  1 x 1 x 1 x   1 x    Emesis             Emesis Occurrence 0 x 0 x 0 x        Drains 190 0 350 160   160      Drain Output (Drain (Miscellaneous) JP Right Abdomen) 190 0 350 160   160    Stool             Stool Occurrence 0 x 0 x 0 x 1 x   1 x    Shift Total  (mL/kg) 640  (6.9) 500  (5.39) 1300  (14.02) 160  (1.68)   160  (1.68)   Weight (kg) 92.7  92.7 92.7 95.1 95.1 95.1 95.1       Nutrition/Residuals:  MNT PROTOCOL FOR DIETITIAN  DIETARY ORAL SUPPLEMENTS Oral Supplements with tray: Ensure Clear-Apple; BREAKFAST/LUNCH/DINNER; 1 Can  DIET CLEAR LIQUID  adult custom variable rate parenteral nutrition  adult custom variable rate parenteral nutrition    Labs  Reviewed:   Lab Results Today:    Results for orders placed or performed during the hospital encounter of 10/25/17 (from the past 24 hour(s))   POC BLOOD GLUCOSE (RESULTS)   Result Value Ref Range  GLUCOSE, POC 222 (H) 70 - 105 mg/dL   SODIUM   Result Value Ref Range    SODIUM 127 (L) 136 - 145 mmol/L   POC BLOOD GLUCOSE (RESULTS)   Result Value Ref Range    GLUCOSE, POC 132 (H) 70 - 105 mg/dL   POC BLOOD GLUCOSE (RESULTS)   Result Value Ref Range    GLUCOSE, POC 115 (H) 70 - 105 mg/dL   ECG 12-LEAD   Result Value Ref Range    Ventricular rate 82 BPM    Atrial Rate 82 BPM    PR Interval 184 ms    QRS Duration 90 ms    QT Interval 378 ms    QTC Calculation 441 ms    Calculated P Axis 59 degrees    Calculated R Axis -5 degrees    Calculated T Axis 82 degrees   BASIC METABOLIC PANEL   Result Value Ref Range    SODIUM 126 (L) 136 - 145 mmol/L    POTASSIUM 4.4 3.5 - 5.1 mmol/L    CHLORIDE 105 96 - 111 mmol/L    CO2 TOTAL 19 (L) 22 - 32 mmol/L    ANION GAP 2 (L) 4 - 13 mmol/L    CALCIUM 8.2 (L) 8.5 - 10.2 mg/dL    GLUCOSE 124 65 - 139 mg/dL    BUN 27 (H) 8 - 25 mg/dL    CREATININE 0.53 0.49 - 1.10 mg/dL    BUN/CREA RATIO 51 (H) 6 - 22    ESTIMATED GFR >59 >59 mL/min/1.46m^2   PHOSPHORUS   Result Value Ref Range    PHOSPHORUS 3.6 2.3 - 4.0 mg/dL   MAGNESIUM   Result Value Ref Range    MAGNESIUM 1.6 1.6 - 2.5 mg/dL   HEPATIC FUNCTION PANEL   Result Value Ref Range    ALBUMIN 1.4 (L) 3.4 - 4.8 g/dL    ALKALINE PHOSPHATASE 79 <150 U/L    ALT (SGPT) 6 <55 U/L    AST (SGOT) 15 8 - 41 U/L    BILIRUBIN TOTAL 0.2 (L) 0.3 - 1.3 mg/dL    BILIRUBIN DIRECT 0.1 <0.3 mg/dL    PROTEIN TOTAL 3.9 (L) 6.0 - 8.0 g/dL   CBC WITH  DIFF   Result Value Ref Range    WBC 8.6 3.5 - 11.0 x10^3/uL    RBC 2.56 (L) 3.63 - 4.92 x10^6/uL    HGB 7.5 (L) 11.2 - 15.2 g/dL    HCT 22.2 (L) 33.5 - 45.2 %    MCV 86.8 78.0 - 100.0 fL    MCH 29.3 27.4 - 33.0 pg    MCHC 33.8 32.5 - 35.8 g/dL    RDW 16.6 (H) 12.0 - 15.0 %    PLATELETS 238 140 - 450 x10^3/uL    MPV 8.1 7.5 - 11.5 fL   MANUAL DIFFERENTIAL (CELLAVISION)   Result Value Ref Range    METAMYELOCYTES 1 0 - 2 %    MORPHOLOGY       No significant types of abnormal RBC and or Platelet morphology noted in microscopic review.    NEUTROPHIL % 80 %    LYMPHOCYTE % 7 %    MONOCYTE % 6 %    EOSINOPHIL % 5 %    BASOPHIL % 1 %    NEUTROPHIL # 6.85 1.50 - 7.70 x10^3/uL    LYMPHOCYTE # 0.62 (L) 1.00 - 4.80 x10^3/uL    MONOCYTE # 0.53 0.30 - 1.00 x10^3/uL    EOSINOPHIL #  0.44 0.00 - 0.50 x10^3/uL    BASOPHIL # 0.09 0.00 - 0.20 x10^3/uL   POC BLOOD GLUCOSE (RESULTS)   Result Value Ref Range    GLUCOSE, POC 145 (H) 70 - 105 mg/dL   POC BLOOD GLUCOSE (RESULTS)   Result Value Ref Range    GLUCOSE, POC 103 70 - 105 mg/dL     Radiology  No new imaging is available for review    Assessment/ Plan:      Active Hospital Problems    Diagnosis   . Primary Problem: Pancreatic cancer (CMS [facility])       This is a 71 y.o. female who is POD 61 s/p robotic whipple for pancreatic cancer    - Continue with TPN, clear liquid  - Continue with current pain control  - Continue with bowel regimen  - Appreciate nephro's recs, patient is on salt tabs per rec  - Appreciate endocrine's recs, appropriate insulin dosage has been ordered   - Dispo: SNF pending    Maryelizabeth Kaufmann, MD  11/10/2017, 08:22       I saw and examined the patient.  I reviewed the resident's note.  I agree with the findings and plan of care as documented in the resident's note.      Drain serous.  Continue working with Nephrology and endocrine.    Lynwood Dawley, MD

## 2017-11-11 NOTE — Nurses Notes (Signed)
Patient up to chair this morning for breakfast. PRN norco overnight effective for pain control. TPN infused overnight per order. RLQ drain dressing changed this morning. LLQ wound dressing changed overnight with nugauze and mepilex border per order.

## 2017-11-11 NOTE — Consults (Signed)
[name], [name]  [id number]    NEPHROLOGY CONSULTATION NOTE    PATIENT NAME:  [name]  CHART NUMBER: [id number]  DATE OF BIRTH: 28-Aug-1946  DATE OF SERVICE: 11/11/2017    Laboratory, hemodynamic and radiological data reviewed this morning.   Medication list reviewed as well.  I did not examine the patient today.     Continue NS and salt tabs. Na checks q6h. Urine checks q6h. Orders placed.     Will continue to monitor; please call with any questions or concerns.      Jennet Maduro, MD 11/11/2017, 13:03  PGY-5  Fellow - Nephrology  [place]       Attending Note:    Late entry for 11/11/2017  I saw and examined the patient, personally performed the critical or key portions of the service and discussed patient management with the Nephrology team including the resident,fellow and ancillary staff.  Patient was examined during morning rounds.  I reviewed Dr. Andree Elk note and agree with the history, findings and plan of care.  Exceptions are edited/noted above.  Laboratory, hemodynamic and radiological data were also reviewed and discussed.    Nathaniel Man, MD  Assistant Professor  Section of Nephrology  [facility] of [place], [place] 11/12/2017, 12:20

## 2017-11-11 NOTE — Nurses Notes (Signed)
1706 Patient alert and oriented during shift. Vital signs stable. Patient eating dinner and tolerating well. Patient complains of occasional tenderness to her abdomen but has no other complaints. Patient denies N/V/D or pain at this time. High fall precautions maintained.

## 2017-11-11 NOTE — Nurses Notes (Signed)
On monitor, V lead demonstrating possible ST elevation, telemetry contacted, strip from 0800 reviewed but unable to clearly identify ST segment in V lead due to poor image quality. Patient denies CP, SOB, running sinus 80s, BP 130s/50s. Dr. Koleen Nimrod informed, order for 12 lead obtained.

## 2017-11-11 NOTE — Nurses Notes (Signed)
Patient has not voided urine in 7 hours. Patient states she feels no urge to void, bladder scan estimated 366 mL retained urine. Dr. Koleen Nimrod informed, states may place order for straight cath.

## 2017-11-12 LAB — MAGNESIUM: MAGNESIUM: 1.9 mg/dL (ref 1.6–2.5)

## 2017-11-12 LAB — BASIC METABOLIC PANEL
ANION GAP: 2 mmol/L — ABNORMAL LOW (ref 4–13)
BUN/CREA RATIO: 47 — ABNORMAL HIGH (ref 6–22)
BUN: 27 mg/dL — ABNORMAL HIGH (ref 8–25)
CALCIUM: 8.2 mg/dL — ABNORMAL LOW (ref 8.5–10.2)
CHLORIDE: 105 mmol/L (ref 96–111)
CO2 TOTAL: 17 mmol/L — ABNORMAL LOW (ref 22–32)
CREATININE: 0.58 mg/dL (ref 0.49–1.10)
ESTIMATED GFR: >59 mL/min/1.73mˆ2 (ref >59)
GLUCOSE: 243 mg/dL — ABNORMAL HIGH (ref 65–139)
POTASSIUM: 4.5 mmol/L (ref 3.5–5.1)
SODIUM: 124 mmol/L — ABNORMAL LOW (ref 136–145)

## 2017-11-12 LAB — ECG 12-LEAD
Atrial Rate: 80 {beats}/min
Calculated P Axis: 12 degrees
Calculated R Axis: -6 degrees
Calculated T Axis: 72 degrees
QRS Duration: 90 ms
QTC Calculation: 431 ms

## 2017-11-12 LAB — CBC
HCT: 22.3 % — ABNORMAL LOW (ref 33.5–45.2)
HGB: 8.5 g/dL — ABNORMAL LOW (ref 11.2–15.2)
MCH: 39.2 pg — ABNORMAL HIGH (ref 27.4–33.0)
MCHC: 38.2 g/dL — ABNORMAL HIGH (ref 32.5–35.8)
MCHC: 38.2 g/dL — ABNORMAL HIGH (ref 32.5–35.8)
MCV: 102.5 fL — ABNORMAL HIGH (ref 78.0–100.0)
MPV: 8.7 fL (ref 7.5–11.5)
PLATELETS: 204 x10ˆ3/uL (ref 140–450)
RBC: 2.18 x10ˆ6/uL — ABNORMAL LOW (ref 3.63–4.92)
RDW: 18.4 % — ABNORMAL HIGH (ref 12.0–15.0)
WBC: 6.6 x10ˆ3/uL (ref 3.5–11.0)

## 2017-11-12 LAB — POC BLOOD GLUCOSE (RESULTS)
GLUCOSE, POC: 159 mg/dL — ABNORMAL HIGH (ref 70–105)
GLUCOSE, POC: 295 mg/dL — ABNORMAL HIGH (ref 70–105)
GLUCOSE, POC: 309 mg/dL — ABNORMAL HIGH (ref 70–105)
GLUCOSE, POC: 298 mg/dL — ABNORMAL HIGH (ref 70–105)
GLUCOSE, POC: 183 mg/dL — ABNORMAL HIGH (ref 70–105)
GLUCOSE, POC: 218 mg/dL — ABNORMAL HIGH (ref 70–105)

## 2017-11-12 LAB — PHOSPHORUS: PHOSPHORUS: 3.8 mg/dL (ref 2.3–4.0)

## 2017-11-12 LAB — OSMOLALITY, RANDOM URINE: OSMOLALITY URINE: 558 mOsm/kg (ref 50–1400)

## 2017-11-12 LAB — SODIUM
SODIUM: 124 mmol/L — ABNORMAL LOW (ref 136–145)
SODIUM: 124 mmol/L — ABNORMAL LOW (ref 136–145)

## 2017-11-12 LAB — TROPONIN-I: TROPONIN I: 494 ng/L (ref 0–30)

## 2017-11-12 LAB — SODIUM, RANDOM URINE: SODIUM RANDOM URINE: 35 mmol/L

## 2017-11-12 MED ORDER — MAGNESIUM SULFATE 2 GRAM/50 ML (4 %) IN WATER INTRAVENOUS PIGGYBACK
2.0000 g | INJECTION | INTRAVENOUS | Status: AC
Start: 2017-11-12 — End: 2017-11-12
  Administered 2017-11-12: 06:00:00 0 g via INTRAVENOUS
  Administered 2017-11-12: 2 g via INTRAVENOUS
  Filled 2017-11-12 (×2): qty 50

## 2017-11-12 MED ORDER — SODIUM CHLORIDE 1 GRAM TABLET
3.00 g | ORAL_TABLET | Freq: Three times a day (TID) | ORAL | Status: DC
Start: 2017-11-12 — End: 2017-11-16
  Administered 2017-11-12 – 2017-11-15 (×10): 3 g via ORAL
  Administered 2017-11-15 – 2017-11-16 (×2): 0 g via ORAL
  Filled 2017-11-12 (×14): qty 3

## 2017-11-12 MED ORDER — TRACE ELEMENTS CR-CU-MN-SE-ZN 10 MCG-1 MG-0.5 MG-60 MCG-5MG/ML IV SOLN
INTRAVENOUS | Status: AC
Start: 2017-11-12 — End: 2017-11-13
  Filled 2017-11-12: qty 950

## 2017-11-12 MED ADMIN — atorvastatin 10 mg tablet: ORAL | @ 21:00:00

## 2017-11-12 MED ADMIN — sodium chloride 1 gram tablet: ORAL | @ 12:00:00

## 2017-11-12 MED ADMIN — sodium chloride 0.9 % intravenous solution: ORAL | @ 09:00:00 | NDC 00338004904

## 2017-11-12 MED ADMIN — baclofen 10 mg tablet: SUBCUTANEOUS | @ 07:00:00

## 2017-11-12 NOTE — Nurses Notes (Signed)
Patient in and out of ventricular trigeminy, surg. onc resident paged.

## 2017-11-12 NOTE — Nurses Notes (Addendum)
pt had 4 beat run of vtach. Pt is asymptomatic. Dr. Juleen China text paged. Will continue to monitor.

## 2017-11-12 NOTE — Progress Notes (Signed)
[facility]  Surgical Oncology Progress Note    [name],[name], 71 y.o. female  Date of Birth:  [birth year]  Date of Admission:  10/25/2017  Date of service: 11/12/2017     Subjective: No acute events overnight.  Tolerated CLD. Denies N/V, confusion, lightheadedness.  Had one BM.  Drinking Ensure.    Physical Exam:  BP (!) 150/58  Pulse 94  Temp 36.9 C (98.4 F)  Resp (!) 23  Ht 1.549 m (5\' 1" )  Wt 96.9 kg (213 lb 10 oz)  SpO2 100%  BMI 40.36 kg/m2    General: Appears in no acute distress, appears as stated age   Eyes: Conjunctiva clear., Pupils equal and round.   HENT:ENMT without erythema or injection, mucous membranes moist.  Lungs: Even and nonlabored respiration  Cardiovascular: normal rate, distal pulses equal and intact  Abdomen: Soft, non-tender, non-distended, RLQ with clear, serous output; LLQ port site dressing with some clear drainage, no purulent production  Extremities: grossly moving all extremities  Skin: Skin warm and dry    I/O:    Date 11/11/17 1500 - 11/12/17 0659 11/12/17 0700 - 11/13/17 0659   Shift 1500-2259 2300-0659 24 Hour Total 0700-1459 1500-2259 2300-0659 24 Hour Total   I  N  T  A  K  E   P.O. 340  460 720 270  990      Oral 340  460 720 270  990    I.V.  (mL/kg/hr) 1155  (1.52) 1720  (2.22) 3465  (1.49) 440  (0.57) 65  505      Med (IV) Flush Volume 10  10          Volume (NS premix infusion) [phone] 300   300      Volume (adult custom variable rate parenteral nutrition) 200  265          Volume (adult custom variable rate parenteral nutrition) 345 1120 1465 140 65  205    Shift Total  (mL/kg) 1495  (15.72) 1720  (17.75) 3925  (40.51) 1160  (11.97) 335  (3.46)  1495  (15.43)   O  U  T  P  U  T   Urine  (mL/kg/hr)  700  (0.9) 700  (0.3) 400  (0.52)   400      Urine (Voided)  700 700 400   400      Urine Occurrence 0 x 1 x 2 x 2 x 2 x  4 x    Emesis     10  10      Emesis     10  10      Emesis Occurrence 0 x  0 x  0 x  0 x    Drains 130 60 350 70 140   210      Drain Output (Drain (Miscellaneous) JP Right Abdomen) 130 60 350 70 140  210    Stool             Stool Occurrence 0 x  1 x 1 x 1 x  2 x    Shift Total  (mL/kg) 130  (1.37) 760  (7.84) 1050  (10.84) 470  (4.85) 150  (1.55)  620  (6.4)   Weight (kg) 95.1 96.9 96.9 96.9 96.9 96.9 96.9       Nutrition/Residuals:  MNT PROTOCOL FOR DIETITIAN  DIETARY ORAL SUPPLEMENTS Oral Supplements with tray: Ensure Clear-Apple; BREAKFAST/LUNCH/DINNER; 1 Can  DIET CLEAR LIQUID  adult  custom variable rate parenteral nutrition    Labs  Reviewed:   Lab Results Today:    Results for orders placed or performed during the hospital encounter of 10/25/17 (from the past 24 hour(s))   SODIUM, RANDOM URINE   Result Value Ref Range    SODIUM RANDOM URINE 35 No Reference Range Established mmol/L   OSMOLALITY, RANDOM URINE   Result Value Ref Range    OSMOLALITY URINE 558 50 - 1400 mOsm/kg   SODIUM   Result Value Ref Range    SODIUM 124 (L) 136 - 145 mmol/L   ECG 12-LEAD   Result Value Ref Range    Ventricular rate 80 BPM    Atrial Rate 80 BPM    PR Interval 184 ms    QRS Duration 90 ms    QT Interval 374 ms    QTC Calculation 431 ms    Calculated P Axis 12 degrees    Calculated R Axis -6 degrees    Calculated T Axis 72 degrees   CBC   Result Value Ref Range    WBC 6.6 3.5 - 11.0 x10^3/uL    RBC 2.18 (L) 3.63 - 4.92 x10^6/uL    HGB 8.5 (L) 11.2 - 15.2 g/dL    HCT 22.3 (L) 33.5 - 45.2 %    MCV 102.5 (H) 78.0 - 100.0 fL    MCH 39.2 (H) 27.4 - 33.0 pg    MCHC 38.2 (H) 32.5 - 35.8 g/dL    RDW 18.4 (H) 12.0 - 15.0 %    PLATELETS 204 140 - 450 x10^3/uL    MPV 8.7 7.5 - 11.5 fL   SODIUM, RANDOM URINE   Result Value Ref Range    SODIUM RANDOM URINE 30 No Reference Range Established mmol/L   OSMOLALITY, RANDOM URINE   Result Value Ref Range    OSMOLALITY URINE 560 50 - 1400 mOsm/kg   POC BLOOD GLUCOSE (RESULTS)   Result Value Ref Range    GLUCOSE, POC 159 (H) 70 - 105 mg/dL   MAGNESIUM   Result Value Ref Range    MAGNESIUM 1.9 1.6 - 2.5 mg/dL    PHOSPHORUS   Result Value Ref Range    PHOSPHORUS 3.8 2.3 - 4.0 mg/dL   BASIC METABOLIC PANEL   Result Value Ref Range    SODIUM 124 (L) 136 - 145 mmol/L    POTASSIUM 4.5 3.5 - 5.1 mmol/L    CHLORIDE 105 96 - 111 mmol/L    CO2 TOTAL 17 (L) 22 - 32 mmol/L    ANION GAP 2 (L) 4 - 13 mmol/L    CALCIUM 8.2 (L) 8.5 - 10.2 mg/dL    GLUCOSE 243 (H) 65 - 139 mg/dL    BUN 27 (H) 8 - 25 mg/dL    CREATININE 0.58 0.49 - 1.10 mg/dL    BUN/CREA RATIO 47 (H) 6 - 22    ESTIMATED GFR >59 >59 mL/min/1.49m^2   POC BLOOD GLUCOSE (RESULTS)   Result Value Ref Range    GLUCOSE, POC 295 (H) 70 - 105 mg/dL   POC BLOOD GLUCOSE (RESULTS)   Result Value Ref Range    GLUCOSE, POC 309 (H) 70 - 105 mg/dL   POC BLOOD GLUCOSE (RESULTS)   Result Value Ref Range    GLUCOSE, POC 298 (H) 70 - 105 mg/dL   POC BLOOD GLUCOSE (RESULTS)   Result Value Ref Range    GLUCOSE, POC 183 (H) 70 - 105 mg/dL   POC BLOOD GLUCOSE (  RESULTS)   Result Value Ref Range    GLUCOSE, POC 218 (H) 70 - 105 mg/dL     Radiology  No new imaging is available for review    Assessment/ Plan:      Active Hospital Problems    Diagnosis   . Primary Problem: Pancreatic cancer (CMS [facility])       This is a 71 y.o. female who is POD 11 s/p robotic whipple for pancreatic cancer    - Continue with TPN, clear liquid diet  - Continue with current pain control  - Continue with bowel regimen  - Appreciate nephro's recs, increase salt tabs to manage hyponatremia  - Appreciate endocrine's recs, appropriate insulin dosage has been ordered   - Dispo: SNF pending  - Wait to advance diet until tomorrow    Juleen China, MD  11/12/2017, 22:06    This is a late entry for 11/12/2017.  I saw and examined this patient with the inpatient resident team on 11/12/2017.  Please see the resident's note, which I have carefully reviewed, for full details. I agree with the findings and plan of care as documented in the resident's note.  Any additions/exceptions are edited/noted.    Feels ok, continued hyponatremia.   Exam benign, incisions intact without complication, JP drain serous.  Labs notable for a Na of 124.  Appreciate renal recs, salt tabs, TPN and clear liquid diet given chyle leak.  Awaiting resolution of hyponatremia prior to dispo.    Illene Silver, MD 11/13/2017 08:45  Associate Professor of Surgery  Division of [place] [facility]

## 2017-11-12 NOTE — Consults (Signed)
[place]  Operated by [facility] Department of [place], [place] [address]    NEPHROLOGY CONSULTATION NOTE    PATIENT NAME: [name]  CHART NUMBER: [id number]  DATE OF BIRTH: May 19, 1946  DATE OF SERVICE: 11/12/2017    Laboratory, hemodynamic and radiological data reviewed this morning.   Medication list reviewed as well.  I did not examine the patient today.     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  Please stop NS & increase NaCl tablets to 3 gm TID.  Continue Na & Urine checks q6h.    Will continue to follow.   Thank you for the consult.  Please call with any questions.    This patient's care wasdiscussed with supervising physician.   Marguarite Arbour, APRN,NP-C  11/12/2017, 11:45        Attending Note:  I did not examine the patient today.   No new recommendations at this time.   Will continue to monitor; please call with any questions or concerns.      I did not see or examine the patient today. I reviewed the resident's note.  I agree with the findings and plan of care as documented in the resident's note.  Any exceptions/additions are edited/noted.          Nathaniel Man, MD  Assistant Professor  Section of Nephrology  Section of [place], [place] 11/12/2017, 12:08

## 2017-11-13 ENCOUNTER — Encounter (HOSPITAL_COMMUNITY): Payer: Self-pay | Admitting: Student in an Organized Health Care Education/Training Program

## 2017-11-13 DIAGNOSIS — D119 Benign neoplasm of major salivary gland, unspecified: Secondary | ICD-10-CM

## 2017-11-13 LAB — POC BLOOD GLUCOSE (RESULTS)
GLUCOSE, POC: 86 mg/dL (ref 70–105)
GLUCOSE, POC: 113 mg/dL — ABNORMAL HIGH (ref 70–105)
GLUCOSE, POC: 93 mg/dL (ref 70–105)
GLUCOSE, POC: 63 mg/dL — ABNORMAL LOW (ref 70–105)
GLUCOSE, POC: 73 mg/dL (ref 70–105)
GLUCOSE, POC: 82 mg/dL (ref 70–105)
GLUCOSE, POC: 105 mg/dL (ref 70–105)
GLUCOSE, POC: 244 mg/dL — ABNORMAL HIGH (ref 70–105)

## 2017-11-13 LAB — ECG 12-LEAD
Calculated P Axis: 57 degrees
Calculated T Axis: 87 degrees
PR Interval: 204 ms
QRS Duration: 100 ms
QT Interval: 362 ms
QTC Calculation: 438 ms
Ventricular rate: 88 {beats}/min

## 2017-11-13 LAB — CBC
HCT: 20.2 % — ABNORMAL LOW (ref 33.5–45.2)
HGB: 6.7 g/dL — CL (ref 11.2–15.2)
MCH: 28.6 pg (ref 27.4–33.0)
MCHC: 33.3 g/dL (ref 32.5–35.8)
MCV: 86.1 fL (ref 78.0–100.0)
MPV: 7.8 fL (ref 7.5–11.5)
PLATELETS: 194 10*3/uL (ref 140–450)
RBC: 2.34 x10ˆ6/uL — ABNORMAL LOW (ref 3.63–4.92)
RDW: 16.5 % — ABNORMAL HIGH (ref 12.0–15.0)
WBC: 6.4 10*3/uL (ref 3.5–11.0)

## 2017-11-13 LAB — BASIC METABOLIC PANEL
ANION GAP: 3 mmol/L — ABNORMAL LOW (ref 4–13)
BUN/CREA RATIO: 52 — ABNORMAL HIGH (ref 6–22)
BUN: 26 mg/dL — ABNORMAL HIGH (ref 8–25)
CALCIUM: 8.2 mg/dL — ABNORMAL LOW (ref 8.5–10.2)
CHLORIDE: 107 mmol/L (ref 96–111)
CO2 TOTAL: 20 mmol/L — ABNORMAL LOW (ref 22–32)
CREATININE: 0.5 mg/dL (ref 0.49–1.10)
ESTIMATED GFR: >59 mL/min/1.73mˆ2 (ref >59)
GLUCOSE: 98 mg/dL (ref 65–139)
POTASSIUM: 4.3 mmol/L (ref 3.5–5.1)
SODIUM: 130 mmol/L — ABNORMAL LOW (ref 136–145)

## 2017-11-13 LAB — PHOSPHORUS: PHOSPHORUS: 3.5 mg/dL (ref 2.3–4.0)

## 2017-11-13 LAB — TROPONIN-I
TROPONIN I: 531 ng/L (ref 0–30)
TROPONIN I: 397 ng/L (ref 0–30)

## 2017-11-13 LAB — MAGNESIUM: MAGNESIUM: 1.4 mg/dL — ABNORMAL LOW (ref 1.6–2.5)

## 2017-11-13 LAB — H & H
HCT: 21.3 % — ABNORMAL LOW (ref 33.5–45.2)
HCT: 24.9 % — ABNORMAL LOW (ref 33.5–45.2)
HGB: 7.1 g/dL — ABNORMAL LOW (ref 11.2–15.2)
HGB: 8.2 g/dL — ABNORMAL LOW (ref 11.2–15.2)

## 2017-11-13 LAB — AMYLASE BODY FLUID: AMYLASE BODY FLUID: 5 U/L

## 2017-11-13 LAB — BILIRUBIN BODY FLUID: BILIRUBIN BODY FLUID: <0.2 mg/dL

## 2017-11-13 LAB — TRIGLYCERIDE: TRIGLYCERIDES: 59 mg/dL (ref <=150)

## 2017-11-13 MED ORDER — TRACE ELEMENTS CR-CU-MN-SE-ZN 10 MCG-1 MG-0.5 MG-60 MCG-5MG/ML IV SOLN
INTRAVENOUS | Status: AC
Start: 2017-11-13 — End: 2017-11-14
  Filled 2017-11-13: qty 950

## 2017-11-13 MED ORDER — MAGNESIUM OXIDE 400 MG (241.3 MG MAGNESIUM) TABLET
400.0000 mg | ORAL_TABLET | Freq: Two times a day (BID) | ORAL | Status: DC
Start: 2017-11-13 — End: 2017-11-14
  Administered 2017-11-13 (×2): 400 mg via ORAL
  Filled 2017-11-13 (×2): qty 1

## 2017-11-13 MED ORDER — PERFLUTREN LIPID MICROSPHERES 1.1 MG/ML INTRAVENOUS SUSPENSION
0.30 mL | INTRAVENOUS | Status: DC
Start: 2017-11-14 — End: 2017-11-16

## 2017-11-13 MED ORDER — SODIUM CHLORIDE 0.9 % IV BOLUS
40.00 mL | INJECTION | Freq: Once | Status: AC | PRN
Start: 2017-11-13 — End: 2017-11-13

## 2017-11-13 MED ADMIN — enoxaparin 40 mg/0.4 mL subcutaneous syringe: SUBCUTANEOUS | @ 06:00:00

## 2017-11-13 MED ADMIN — losartan 50 mg tablet: ORAL | @ 10:00:00

## 2017-11-13 MED ADMIN — electrolyte-A intravenous solution: ORAL | @ 18:00:00 | NDC 00338022104

## 2017-11-13 MED ADMIN — lactated Ringers intravenous solution: ORAL | @ 08:00:00

## 2017-11-13 MED ADMIN — nystatin 100,000 unit/gram topical powder: @ 14:00:00 | NDC 00832046515

## 2017-11-13 NOTE — Nurses Notes (Signed)
Hgb: 7.1. Dr. Willa Rough notified. Will continue to monitor.

## 2017-11-13 NOTE — Progress Notes (Signed)
[facility]  Surgical Oncology Progress Note    [name],[name], 71 y.o. female  Date of Birth:  1946/04/11  Date of Admission:  10/25/2017  Date of service: 11/13/2017     Subjective: 4 beat run of VTach prompted cardiac workup.  EKG showed normal sinus rhythm with PVCs.  Troponin returned 494.  Tolerated FLD. Endorses some N last night.  2 BMs.    Physical Exam:  BP (!) 118/56  Pulse 95  Temp 37.1 C (98.7 F)  Resp 20  Ht 1.549 m (5\' 1" )  Wt 98.7 kg (217 lb 9.5 oz)  SpO2 100%  BMI 41.11 kg/m2    General: Appears in no acute distress, appears as stated age   Eyes: Conjunctiva clear., Pupils equal and round.   HENT:ENMT without erythema or injection, mucous membranes moist.  Lungs: Even and nonlabored respiration  Cardiovascular: normal rate, distal pulses equal and intact  Abdomen: Soft, non-tender, non-distended, R JP drain with clear, serous output; LLQ port site dressing with some clear drainage, no purulent production  Extremities: grossly moving all extremities  Skin: Skin warm and dry    I/O:    Date 11/12/17 0700 - 11/13/17 0659 11/13/17 0700 - 11/14/17 0659   Shift 0700-1459 1500-2259 2300-0659 24 Hour Total 0700-1459 1500-2259 2300-0659 24 Hour Total   I  N  T  A  K  E   P.O. 720 270 60 1050          Oral 720 270 60 1050        I.V.  (mL/kg/hr) 440  (0.57) 410  (0.53) 1120  (1.42) 1970  (0.83)          Volume (NS premix infusion) 300   300          Volume (adult custom variable rate parenteral nutrition) 140 65  205          Volume (adult custom variable rate parenteral nutrition)  345 1120 1465        Shift Total  (mL/kg) 1160  (11.97) 680  (7.02) 1180  (11.96) 3020  (30.6)       O  U  T  P  U  T   Urine  (mL/kg/hr) 400  (0.52)   400  (0.17)          Urine (Voided) 400   400          Urine Occurrence 2 x 2 x 1 x 5 x        Emesis  10  10          Emesis  10  10          Emesis Occurrence  0 x 0 x 0 x        Drains 70 140 60 270          Drain Output (Drain (Miscellaneous) JP  Right Abdomen) 70 140 60 270        Stool              Stool Occurrence 1 x 1 x 0 x 2 x        Shift Total  (mL/kg) 470  (4.85) 150  (1.55) 60  (0.61) 680  (6.89)       Weight (kg) 96.9 96.9 98.7 98.7 98.7 98.7 98.7 98.7       Nutrition/Residuals:  MNT PROTOCOL FOR DIETITIAN  DIETARY ORAL SUPPLEMENTS Oral Supplements with tray: Ensure Clear-Apple; BREAKFAST/LUNCH/DINNER; 1 Can  DIET  CLEAR LIQUID  adult custom variable rate parenteral nutrition    Labs  Reviewed:   Lab Results Today:    Results for orders placed or performed during the hospital encounter of 10/25/17 (from the past 24 hour(s))   POC BLOOD GLUCOSE (RESULTS)   Result Value Ref Range    GLUCOSE, POC 295 (H) 70 - 105 mg/dL   POC BLOOD GLUCOSE (RESULTS)   Result Value Ref Range    GLUCOSE, POC 309 (H) 70 - 105 mg/dL   POC BLOOD GLUCOSE (RESULTS)   Result Value Ref Range    GLUCOSE, POC 298 (H) 70 - 105 mg/dL   POC BLOOD GLUCOSE (RESULTS)   Result Value Ref Range    GLUCOSE, POC 183 (H) 70 - 105 mg/dL   POC BLOOD GLUCOSE (RESULTS)   Result Value Ref Range    GLUCOSE, POC 218 (H) 70 - 105 mg/dL   TROPONIN-I   Result Value Ref Range    TROPONIN I 494 (HH) 0 - 30 ng/L   BASIC METABOLIC PANEL   Result Value Ref Range    SODIUM 130 (L) 136 - 145 mmol/L    POTASSIUM 4.3 3.5 - 5.1 mmol/L    CHLORIDE 107 96 - 111 mmol/L    CO2 TOTAL 20 (L) 22 - 32 mmol/L    ANION GAP 3 (L) 4 - 13 mmol/L    CALCIUM 8.2 (L) 8.5 - 10.2 mg/dL    GLUCOSE 98 65 - 139 mg/dL    BUN 26 (H) 8 - 25 mg/dL    CREATININE 0.50 0.49 - 1.10 mg/dL    BUN/CREA RATIO 52 (H) 6 - 22    ESTIMATED GFR >59 >59 mL/min/1.12m^2   CBC   Result Value Ref Range    WBC 6.4 3.5 - 11.0 x10^3/uL    RBC 2.34 (L) 3.63 - 4.92 x10^6/uL    HGB 6.7 (LL) 11.2 - 15.2 g/dL    HCT 20.2 (L) 33.5 - 45.2 %    MCV 86.1 78.0 - 100.0 fL    MCH 28.6 27.4 - 33.0 pg    MCHC 33.3 32.5 - 35.8 g/dL    RDW 16.5 (H) 12.0 - 15.0 %    PLATELETS 194 140 - 450 x10^3/uL    MPV 7.8 7.5 - 11.5 fL   MAGNESIUM   Result Value Ref Range    MAGNESIUM  1.4 (L) 1.6 - 2.5 mg/dL   PHOSPHORUS   Result Value Ref Range    PHOSPHORUS 3.5 2.3 - 4.0 mg/dL   TROPONIN-I   Result Value Ref Range    TROPONIN I 531 (HH) 0 - 30 ng/L   TRIGLYCERIDE   Result Value Ref Range    TRIGLYCERIDES 59 <=150 mg/dL   POC BLOOD GLUCOSE (RESULTS)   Result Value Ref Range    GLUCOSE, POC 86 70 - 105 mg/dL   H & H   Result Value Ref Range    HGB 7.1 (L) 11.2 - 15.2 g/dL    HCT 21.3 (L) 33.5 - 45.2 %     Radiology  No new imaging is available for review    Assessment/ Plan:      Active Hospital Problems    Diagnosis   . Primary Problem: Pancreatic cancer (CMS [facility])       This is a 71 y.o. female who is POD 49 s/p robotic whipple for pancreatic cancer.    - Advance to regular diet  - F/u repeat troponin at 1300  -  Consult cardiology  - F.u amylase, lipase and bilirubin of the JP drain ouput  - Repeat Hgb this afternoon  - Continue with current pain control  - Continue with bowel regimen  - Appreciate nephro's recs, increase salt tabs to manage hyponatremia  - Appreciate endocrine's recs, appropriate insulin dosage has been ordered   - Dispo: SNF pending    Juleen China, MD  11/12/2017, 22:06

## 2017-11-13 NOTE — Progress Notes (Signed)
Endocrinology:    Ms. Stempel is not eating well during the day    Recent Labs      11/11/17   1612  11/11/17   2119  11/11/17   2204  11/12/17   0545  11/12/17   1222  11/12/17   1301  11/12/17   1542  11/12/17   1934  11/12/17   2035  11/13/17   0620  11/13/17   1003  11/13/17   1127   GLUIP  101  69*  94  159*  295*  309*  298*  183*  218*  86  113*  93           Blood sugars in the a.m. after the TPN or well-controlled  BP (!) 152/90  Pulse 90  Temp 36.6 C (97.9 F)  Resp 19  Ht 1.549 m (5\' 1" )  Wt 98.7 kg (217 lb 9.5 oz)  SpO2 100%  BMI 41.11 kg/m2    Recommend discontinuing R insulin during the day  Shela Commons, MD  11/13/2017, 14:56

## 2017-11-13 NOTE — Nurses Notes (Signed)
FS 86. Dr. Willa Rough notified and he said to hold regular insulin this morning.

## 2017-11-13 NOTE — Care Plan (Signed)
Problem: Patient Care Overview (Adult,OB)  Goal: Plan of Care Review(Adult,OB)  The patient and/or their representative will communicate an understanding of their plan of care   Outcome: Ongoing (see interventions/notes)  Pt admitted for pancreatic cancer, s/p whipple procedure. Fall and skin precaution maintained. Pt had 4 beat run of vtach. EKG ordered, troponin: 494. Pt is strict I and O with limit of 1L/ day. Running sinus rhythm/ trigeminy at times. SCD on. Incision and dressing intact. Pt is resting with call bell at reach. Will continue to monitor.     Problem: Fall Risk (Adult)  Goal: Identify Related Risk Factors and Signs and Symptoms  Related risk factors and signs and symptoms are identified upon initiation of Human Response Clinical Practice Guideline (CPG).  Outcome: Completed Date Met: 11/13/17      Problem: Skin Injury Risk (Adult,Obstetrics,Pediatric)  Goal: Identify Related Risk Factors and Signs and Symptoms  Related risk factors and signs and symptoms are identified upon initiation of Human Response Clinical Practice Guideline (CPG).  Outcome: Completed Date Met: 11/13/17

## 2017-11-13 NOTE — Nurses Notes (Signed)
10 units of R insulin held per phone order with read back per Dr. Heron Sabins. Pt had hypoglycemic episode earlier in the day and was afraid of a recurrence

## 2017-11-13 NOTE — Care Management Notes (Signed)
[facility]  Care Management Note    Patient Name: [name]  Date of Birth: 1946/09/04  Sex: female  Date/Time of Admission: 10/25/2017  5:34 AM  Room/Bed: 975/A  Payor: MEDICARE / Plan: MEDICARE PART A AND B / Product Type: Medicare /    LOS: 19 days   PCP: No Pcp    Admitting Diagnosis:  Pancreatic cancer (CMS [place]) [C25.9]    Assessment:      11/13/17 1449   Assessment Details   Assessment Type Continued Assessment   Date of Care Management Update 11/13/17   Date of Next DCP Update 11/16/17   Care Management Plan   Discharge Planning Status plan in progress   Projected Discharge Date 11/14/17   CM will evaluate for rehabilitation potential yes   Patient choice offered to patient/family yes   Form for patient choice reviewed/signed and on chart no   Facility or [place] Bed   Patient aware of possible cost for ambulance transport?  Yes   Discharge Needs Assessment   Discharge Facility/Level Of Care Needs Swing Bed-Hospital Based (code 63)   Transportation Available ambulance       Discharge Plan:  Swing Bed-Hospital Based (code 36)  Per service patient planned cardiology consult today and will receive 1unitPRBC today.  Patient possible discharge to swing bed unit at [facility].  [place] spoke with Ebony Hail at [facility] who states patient is accepted, per Ebony Hail their MD, Pharmacist, PT/OT, and CFO have all accepted.  Patient anticipated discharge to swing bed unit once medically stable.  CCC to follow.     The patient will continue to be evaluated for developing discharge needs.     Case Manager: Frederik Pear, RN  Phone: [phone]

## 2017-11-13 NOTE — Consults (Signed)
[facility]  Cardiology CONSULTATION  History and Physical      [name],[name], 71 y.o. female  Encounter Start Date:  10/25/2017  Inpatient Admission Date: 10/25/2017   Date of Service: 11/13/2017  Admission Source: Home  Date of Birth:  Mar 09, 1946  PCP:  No Pcp    Information Obtained from: patient and Past medical history  Chief Complaint:  Palpitation    HPI:  [name] is a 71 y.o. female who was admitted to the hospital for pancreatic cancer s/p whipple surgery. The patient had been treated with 5FLU for her cancer in [place]. The patient has a PMHS of asthma, DM type II, sleep apnea with use of CPAP and oxygen at home. The patient stopped using her CPAP and oxygen at home since she moved from [name] this past summer. She reports feeling well. However she admits having spells of palpitation that resolves spontaneously. The patient also reports having exertional shortness of breath and peripheral edema. She denies chest pain, fever, chills, severe abdominal pain, or other complaints.The patient is a previous smoker of 0.5-1 ppd for 20 years who quit smoking 25 years ago.        Past Medical History:   Diagnosis Date   . Abdominal hernia 10/18/2017    hx of repair   . Anxiety    . Arthritis    . Asthma    . Atrial fibrillation (CMS HCC)    . Back problem    . Bruises easily    . Cancer (CMS [place]) 10/18/2017    chemo and radiation completed 09/24/2017   . COPD (chronic obstructive pulmonary disease) (CMS HCC)    . CPAP (continuous positive airway pressure) dependence 10/18/2017    has not used recently   . Depression    . DM (diabetes mellitus) (CMS HCC) 2000    Fasting BG 200's. HGA1C 2018 6   . Edema    . Essential hypertension    . GERD (gastroesophageal reflux disease)    . Headache    . Heartburn    . Hx antineoplastic chemo 2018   . Hx of radiation therapy 2018   . Hypercholesteremia    . Hyperlipidemia    . Migraine 10/18/2017    none recently   . Obesity    . Palpitations    .  Pancreatic cancer (CMS [place]) 03/2017   . Panic attack    . Peripheral neuropathy 10/18/2017    knees down, bilateral   . Sleep apnea    . Type 2 diabetes mellitus (CMS HCC) 10/18/2017    Dx 2000 FBS 200s   . Unintentional weight loss 10/18/2017    25 lbs 03/2017   . Wears glasses          Past Surgical History:   Procedure Laterality Date   . CESAREAN SECTION     . HX HERNIA REPAIR     . HX SUBCLAVIAN PORT IMPLANTION      s/p removed   . HX SUBCLAVIAN PORT IMPLANTION     . HX TONSIL AND ADENOIDECTOMY     . HX TONSILLECTOMY     . UMBILICAL HERNIA REPAIR      x2           Prior to Admission Medications:  Medications Prior to Admission     Prescriptions    aspirin (ECOTRIN) 81 mg Oral Tablet, Delayed Release (E.C.)    Take 81 mg by mouth    atorvastatin (LIPITOR) 10 mg  Oral Tablet    Take 10 mg by mouth    cetirizine (ZYRTEC) 10 mg Oral Tablet    Take 10 mg by mouth    citalopram (CELEXA) 20 mg Oral Tablet    Take 20 mg by mouth    furosemide (LASIX) 20 mg Oral Tablet    20 mg Once a day     gabapentin (NEURONTIN) 300 mg Oral Capsule    300 mg Once a day     glipiZIDE (GLUCOTROL XL) 10 mg Oral Tablet Extended Rel 24 hr (2)    Take 10 mg by mouth    hydroCHLOROthiazide (HYDRODIURIL) 25 mg Oral Tablet    25 mg Once a day     HYDROcodone-acetaminophen (NORCO) 5-325 mg Oral Tablet    Take 1 Tab by mouth Every 4 hours as needed for Pain    lidocaine-prilocaine (EMLA) 2.5-2.5 % Cream    LORazepam (ATIVAN) 0.5 mg Oral Tablet    0.5 mg Once a day     losartan (COZAAR) 100 mg Oral Tablet    Take 100 mg by mouth Once a day     MetFORMIN (GLUCOPHAGE) 1,000 mg Oral Tablet    1,000 mg Twice daily with food     metoprolol succinate (TOPROL-XL) 50 mg Oral Tablet Sustained Release 24 hr    50 mg Once a day     montelukast (SINGULAIR) 10 mg Oral Tablet    Take 10 mg by mouth    omeprazole (PRILOSEC) 20 mg Oral Capsule, Delayed Release(E.C.)    20 mg Once a day     ondansetron (ZOFRAN) 8 mg Oral Tablet    Take 8 mg by mouth Every 8 hours  as needed     potassium chloride (K-DUR) 20 mEq Oral Tab Sust.Rel. Particle/Crystal    Take 20 mEq by mouth    prochlorperazine (COMPAZINE) 10 mg Oral Tablet    Take 10 mg by mouth    promethazine (PHENERGAN) 25 mg Oral Tablet    Take 25 mg by mouth        Allergies   Allergen Reactions   . Sulfa (Sulfonamides) Itching     Dye Allergy:  No  Iodine Allergy:  No    Family History:  Family Medical History:     Problem Relation (Age of Onset)    Diabetes Mother    Heart Disease Mother          Social History:  Social History     Social History   . Marital status: Widowed     Spouse name: N/A   . Number of children: N/A   . Years of education: N/A     Occupational History   . Not on file.     Social History Main Topics   . Smoking status: Former Smoker     Packs/day: 0.50     Years: 20.00     Quit date: 10/18/1993   . Smokeless tobacco: Never Used   . Alcohol use No   . Drug use: No   . Sexual activity: Not on file     Other Topics Concern   . Ability To Walk 1 Flight Of Steps Without Sob/Cp No     SOB, denies CP   . Ability To Walk 2 Flight Of Steps Without Sob/Cp No   . Ability To Do Own Adl's Yes   . Other Activity Level Yes     light housework     Social History Narrative  ROS: Other than ROS in the HPI, all other systems were negative.             -All other systems were reviewed and are negative.     Exam:  Temperature: 37.1 C (98.7 F)  Heart Rate: 95  BP (Non-Invasive): (!) 118/56  Respiratory Rate: 20  SpO2-1: 100 %  Pain Score (Numeric, Faces): 6  Constitutional:  appears chronically ill.   Skin: warm and dry and jaundiced.   Eyes: Conjunctiva clear  Neck:  No masses  Lungs: Mild end of expiratory wheezes.  Heart: Irregular heart rate. Normal S1, S2 no murmur or gallops.  Vascular: +1 Peripheral edema in the BLE   Ext:  No clubbing or cyanosis  Psych:  Affect flat  Neuro:  Answers questions appropriately    Labs:  I have reviewed all lab results.  Imaging Studies:    XR CHEST PA AND LATERAL performed on  10/18/2017 5:22 PM.    REASON FOR EXAM:  C25.9: Malignant neoplasm of pancreas, unspecified  location of malignancy (CMS HCC)    TECHNIQUE: 2 views/3 images submitted for interpretation.    COMPARISON:  None    FINDINGS:  There is no focal consolidation, pleural effusion, or  pneumothorax. The heart size.    Support devices:  Right IJ medication port with catheter tip projecting over the upper SVC.    IMPRESSION:  Unremarkable chest radiographs    Previous ECG results:  Sinus rhythm with frequent Premature ventricular complexes  Otherwise normal ECG  When compared with ECG of 12-Nov-2017 04:05,  No significant change was found    Patient has decision making capacity:  Yes  Advance Directive:  No Advance Directive  Code Status:  Full Code    Assessment/Plan:  Active Hospital Problems    Diagnosis   . Primary Problem: Pancreatic cancer (CMS [facility])     Neva Ramaswamy is a 71 y.o F with PMHs of asthma, sleep apnea, DM typeII and pancreatic cancer s/p whipple surgery presenting with asymptomatic non-sustained V-tach and elevated troponin.     Hypertroponenemia; etiology is likely demand ischemia given the fluctuating hemoglobin and underlying coronary artery disease   -- repeat Troponin check, if up-trending continue trending   -- HGB is 7.1 please consider blood transfusion as patient is potentially symptomatic and had recent HGB below 7  -- Please obtain TTE to evaluate for any valvular abnormalities   -- Consider outpatient cardiac ischemia work-up     Non-sustained V-tach of ~4beats   -- Low Mg of 1.4, please correct magnesium  -- Please monitor BMP daily and correct Potassium and Magnesium appropriately   -- Patient should follow-up with PCP or Pulmonologist for sleep apnea and asthma to assure appropriate treatment     Thank you for the consult please call for any question    DVT/PE Prophylaxis: Enoxaparin       Nunzio Cory, MD  11/13/2017, 09:50      I saw and examined the patient.  I reviewed the  resident's note.  I agree with the findings and plan of care as documented in the resident's note.  Any exceptions/additions are edited/noted.    Alvira Philips, MD

## 2017-11-14 ENCOUNTER — Inpatient Hospital Stay (HOSPITAL_COMMUNITY): Payer: Medicare Other

## 2017-11-14 DIAGNOSIS — I499 Cardiac arrhythmia, unspecified: Secondary | ICD-10-CM

## 2017-11-14 DIAGNOSIS — I361 Nonrheumatic tricuspid (valve) insufficiency: Secondary | ICD-10-CM

## 2017-11-14 DIAGNOSIS — R7989 Other specified abnormal findings of blood chemistry: Secondary | ICD-10-CM

## 2017-11-14 DIAGNOSIS — R000 Tachycardia, unspecified: Secondary | ICD-10-CM

## 2017-11-14 LAB — BPAM PACKED CELL ORDER: UNIT DIVISION: 0

## 2017-11-14 LAB — CBC
HCT: 25 % — ABNORMAL LOW (ref 33.5–45.2)
HGB: 8.2 g/dL — ABNORMAL LOW (ref 11.2–15.2)
MCH: 28.2 pg (ref 27.4–33.0)
MCHC: 33 g/dL (ref 32.5–35.8)
MCV: 85.6 fL (ref 78.0–100.0)
MPV: 8.5 fL (ref 7.5–11.5)
PLATELETS: 195 10*3/uL (ref 140–450)
RDW: 16.4 % — ABNORMAL HIGH (ref 12.0–15.0)

## 2017-11-14 LAB — TYPE AND CROSS RED CELLS - UNITS
ABO/RH(D): A POS
ANTIBODY SCREEN: NEGATIVE

## 2017-11-14 LAB — POC BLOOD GLUCOSE (RESULTS)
GLUCOSE, POC: 204 mg/dL — ABNORMAL HIGH (ref 70–105)
GLUCOSE, POC: 122 mg/dL — ABNORMAL HIGH (ref 70–105)
GLUCOSE, POC: 129 mg/dL — ABNORMAL HIGH (ref 70–105)
GLUCOSE, POC: 78 mg/dL (ref 70–105)
GLUCOSE, POC: 183 mg/dL — ABNORMAL HIGH (ref 70–105)

## 2017-11-14 LAB — HEPATIC FUNCTION PANEL
ALBUMIN: 1.4 g/dL — ABNORMAL LOW (ref 3.4–4.8)
ALKALINE PHOSPHATASE: 88 U/L (ref <150)
ALT (SGPT): 9 U/L (ref <55)
AST (SGOT): 15 U/L (ref 8–41)
BILIRUBIN DIRECT: 0.3 mg/dL — ABNORMAL HIGH (ref <0.3)
BILIRUBIN TOTAL: 0.6 mg/dL (ref 0.3–1.3)
PROTEIN TOTAL: 4 g/dL — ABNORMAL LOW (ref 6.0–8.0)

## 2017-11-14 LAB — BASIC METABOLIC PANEL
ANION GAP: 5 mmol/L (ref 4–13)
ANION GAP: 5 mmol/L (ref 4–13)
BUN/CREA RATIO: 33 — ABNORMAL HIGH (ref 6–22)
BUN/CREA RATIO: 33 — ABNORMAL HIGH (ref 6–22)
BUN: 19 mg/dL (ref 8–25)
CALCIUM: 8.3 mg/dL — ABNORMAL LOW (ref 8.5–10.2)
CHLORIDE: 104 mmol/L (ref 96–111)
CO2 TOTAL: 19 mmol/L — ABNORMAL LOW (ref 22–32)
CREATININE: 0.57 mg/dL (ref 0.49–1.10)
GLUCOSE: 235 mg/dL — ABNORMAL HIGH (ref 65–139)
POTASSIUM: 4.2 mmol/L (ref 3.5–5.1)
SODIUM: 128 mmol/L — ABNORMAL LOW (ref 136–145)

## 2017-11-14 LAB — SODIUM: SODIUM: 131 mmol/L — ABNORMAL LOW (ref 136–145)

## 2017-11-14 LAB — SODIUM, RANDOM URINE: SODIUM RANDOM URINE: 38 mmol/L

## 2017-11-14 LAB — PHOSPHORUS: PHOSPHORUS: 3.7 mg/dL (ref 2.3–4.0)

## 2017-11-14 LAB — OSMOLALITY, RANDOM URINE: OSMOLALITY URINE: 635 mOsm/kg (ref 50–1400)

## 2017-11-14 MED ORDER — MAGNESIUM OXIDE 400 MG (241.3 MG MAGNESIUM) TABLET
400.00 mg | ORAL_TABLET | Freq: Two times a day (BID) | ORAL | Status: DC
Start: 2017-11-14 — End: 2017-11-14

## 2017-11-14 MED ORDER — MAGNESIUM OXIDE 400 MG (241.3 MG MAGNESIUM) TABLET
800.00 mg | ORAL_TABLET | Freq: Two times a day (BID) | ORAL | Status: DC
Start: 2017-11-14 — End: 2017-11-16
  Administered 2017-11-14 – 2017-11-16 (×5): 800 mg via ORAL
  Filled 2017-11-14 (×5): qty 2

## 2017-11-14 MED ORDER — MAGNESIUM SULFATE 4 GRAM/100 ML (4 %) IN WATER INTRAVENOUS PIGGYBACK
4.0000 g | INJECTION | Freq: Once | INTRAVENOUS | Status: AC
Start: 2017-11-14 — End: 2017-11-14
  Administered 2017-11-14: 4 g via INTRAVENOUS
  Filled 2017-11-14: qty 100

## 2017-11-14 MED ORDER — TRACE ELEMENTS CR-CU-MN-SE-ZN 10 MCG-1 MG-0.5 MG-60 MCG-5MG/ML IV SOLN
INTRAVENOUS | Status: AC
Start: 2017-11-14 — End: 2017-11-15
  Filled 2017-11-14: qty 470

## 2017-11-14 MED ADMIN — HYDROmorphone (PF) 30 mg/30 mL (1 mg/mL) in 0.9 % NaCl IV PCA syringe: ORAL | @ 22:00:00 | NDC 61553060120

## 2017-11-14 NOTE — Care Plan (Signed)
Problem: Patient Care Overview (Adult,OB)  Goal: Plan of Care Review(Adult,OB)  The patient and/or their representative will communicate an understanding of their plan of care   Outcome: Ongoing (see interventions/notes)  Patient free from falls. She ambulated to the restroom several times and walked the halls once. Dressings have been changed frequently. Patient pleasant and alert. Complaints of pain and has been medicated.   Goal: Individualization/Patient Specific Goal(Adult/OB)  Outcome: Ongoing (see interventions/notes)      Problem: Perioperative Period (Adult)  Prevent and manage potential problems including:1. bleeding2. gastrointestinal complications3. hypothermia4. infection5. pain6. perioperative injury7. respiratory compromise8. situational response9. urinary retention10. venous [id number]. wound complications   Goal: Signs and Symptoms of Listed Potential Problems Will be Absent or Manageable (Perioperative Period)  Signs and symptoms of listed potential problems will be absent or manageable by discharge/transition of care (reference Perioperative Period (Adult) CPG).   Outcome: Ongoing (see interventions/notes)      Problem: Nutrition, Imbalanced: Inadequate Oral Intake (Adult)  Goal: Improved Oral Intake  Patient will demonstrate the desired outcomes by discharge/transition of care.   Outcome: Ongoing (see interventions/notes)    Goal: Prevent Further Weight Loss  Patient will demonstrate the desired outcomes by discharge/transition of care.   Outcome: Ongoing (see interventions/notes)      Problem: Nutrition, Parenteral (Adult)  Prevent and manage potential problems including:  1. fluid/electrolyte imbalance  2. glycemic control impaired  3. infection  4. infusion-related complications  5. malnutrition   Goal: Signs and Symptoms of Listed Potential Problems Will be Absent, Minimized or Managed (Nutrition, Parenteral)  Signs and symptoms of listed potential problems will be absent, minimized or  managed by discharge/transition of care (reference Nutrition, Parenteral (Adult) CPG).   Outcome: Ongoing (see interventions/notes)    Goal: Signs and Symptoms of Listed Potential Problems Will be Absent, Minimized or Managed (Nutrition, Parenteral)  Signs and symptoms of listed potential problems will be absent, minimized or managed by discharge/transition of care (reference Nutrition, Parenteral (Adult) CPG).   Outcome: Ongoing (see interventions/notes)      Problem: Diarrhea (Adult)  Goal: Improved/Reduced Symptoms  Patient will demonstrate the desired outcomes by discharge/transition of care.   Outcome: Ongoing (see interventions/notes)      Problem: Fall Risk (Adult)  Goal: Absence of Falls  Patient will demonstrate the desired outcomes by discharge/transition of care.   Outcome: Ongoing (see interventions/notes)      Problem: Skin Injury Risk (Adult,Obstetrics,Pediatric)  Goal: Skin Health and Integrity  Patient will demonstrate the desired outcomes by discharge/transition of care.   Outcome: Ongoing (see interventions/notes)

## 2017-11-14 NOTE — Progress Notes (Signed)
[facility]  Surgical Oncology Progress Note    [name],[name], 71 y.o. female  Date of Birth:  08/30/1946  Date of Admission:  10/25/2017  Date of service: 11/14/2017     Subjective: NAEO. Cardiology consulted for elevated troponins yesterday. Trops trended down. Tolerating regular diet. 1 U PRBCs given yesterday with appropriate response.    Physical Exam:  BP (!) 153/44  Pulse 92  Temp 36.9 C (98.5 F)  Resp 20  Ht 1.549 m (5\' 1" )  Wt 102.5 kg (225 lb 15.5 oz)  SpO2 98%  BMI 42.7 kg/m2    General: Appears in no acute distress, appears as stated age  Eyes: Conjunctiva clear., Pupils equal and round.   HENT:ENMT without erythema or injection, mucous membranes moist.  Lungs: Even and nonlabored respiration  Cardiovascular: normal rate, distal pulses equal and intact  Abdomen: Soft, non-tender, non-distended, R JP drain with clear, serous output; LLQ port site dressing with some clear drainage, no purulence   Extremities: grossly moving all extremities  Skin: Skin warm and dry    I/O:    Date 11/13/17 0700 - 11/14/17 0659 11/14/17 0700 - 11/15/17 0659   Shift 0700-1459 1500-2259 2300-0659 24 Hour Total 0700-1459 1500-2259 2300-0659 24 Hour Total   I  N  T  A  K  E   P.O. 600 420 60 1080          Oral 600 420 60 1080        I.V.  (mL/kg/hr)  345  (0.44) 1120  (1.37) 1465  (0.6) 140   140      Volume (adult custom variable rate parenteral nutrition)  345 1120 1465 140   140    Blood  364  364          Volume (mL) (NURSING--TRANSFUSE RED BLOOD CELLS 1 Unit)  364  364        Shift Total  (mL/kg) 600  (6.08) 1129  (11.44) 1180  (11.51) 2909  (28.38) 140  (1.37)   140  (1.37)   O  U  T  P  U  T   Urine  (mL/kg/hr)              Urine Occurrence  1 x 2 x 3 x        Emesis              Emesis Occurrence 0 x 0 x 0 x 0 x        Drains 30 0 70 100          Drain Output (Drain (Miscellaneous) JP Right Abdomen) 30 0 70 100        Other              Other  0 x 0 x 0 x        Stool              Stool  Occurrence 1 x 0 x 1 x 2 x        Shift Total  (mL/kg) 30  (0.3) 0  (0) 70  (0.68) 100  (0.98)       Weight (kg) 98.7 98.7 102.5 102.5 102.5 102.5 102.5 102.5       Nutrition/Residuals:  MNT PROTOCOL FOR DIETITIAN  DIETARY ORAL SUPPLEMENTS Oral Supplements with tray: Ensure Clear-Apple; BREAKFAST/LUNCH/DINNER; 1 Can  adult custom variable rate parenteral nutrition  DIET DIABETIC Calorie amount: CC 2000    Labs  Reviewed:   Lab Results Today:    Results for orders placed or performed during the hospital encounter of 10/25/17 (from the past 24 hour(s))   POC BLOOD GLUCOSE (RESULTS)   Result Value Ref Range    GLUCOSE, POC 113 (H) 70 - 105 mg/dL   POC BLOOD GLUCOSE (RESULTS)   Result Value Ref Range    GLUCOSE, POC 93 70 - 105 mg/dL   TROPONIN-I   Result Value Ref Range    TROPONIN I 397 (HH) 0 - 30 ng/L   TYPE AND CROSS RED CELLS NON IRRADIATED   Result Value Ref Range    UNITS ORDERED 1     SPECIMEN EXPIRATION DATE 11/16/2017     ABO/RH(D) A POSITIVE     ANTIBODY SCREEN NEGATIVE    BPAM PACKED CELL ORDER   Result Value Ref Range    Coding System [id number]     UNIT NUMBER [id number]     BLOOD COMPONENT TYPE LR RBC, Adsol3, 04730     UNIT DIVISION 00     UNIT DISPENSE STATUS ISSUED,FINAL     TRANSFUSION STATUS OK TO TRANSFUSE     IS CROSSMATCH Electronically Compatible     Product Code [id number]    POC BLOOD GLUCOSE (RESULTS)   Result Value Ref Range    GLUCOSE, POC 63 (L) 70 - 105 mg/dL   POC BLOOD GLUCOSE (RESULTS)   Result Value Ref Range    GLUCOSE, POC 73 70 - 105 mg/dL   POC BLOOD GLUCOSE (RESULTS)   Result Value Ref Range    GLUCOSE, POC 82 70 - 105 mg/dL   POC BLOOD GLUCOSE (RESULTS)   Result Value Ref Range    GLUCOSE, POC 105 70 - 105 mg/dL   AMYLASE BODY FLUID   Result Value Ref Range    AMYLASE BODY FLUID 5 No Reference Range Established U/L   BILIRUBIN BODY FLUID   Result Value Ref Range    BILIRUBIN BODY FLUID <0.2 No Reference Range Established mg/dL   H & H   Result Value Ref Range    HGB 8.2 (L) 11.2 -  15.2 g/dL    HCT 24.9 (L) 33.5 - 45.2 %   POC BLOOD GLUCOSE (RESULTS)   Result Value Ref Range    GLUCOSE, POC 244 (H) 70 - 105 mg/dL   CBC   Result Value Ref Range    WBC 6.5 3.5 - 11.0 x10^3/uL    RBC 2.91 (L) 3.63 - 4.92 x10^6/uL    HGB 8.2 (L) 11.2 - 15.2 g/dL    HCT 25.0 (L) 33.5 - 45.2 %    MCV 85.6 78.0 - 100.0 fL    MCH 28.2 27.4 - 33.0 pg    MCHC 33.0 32.5 - 35.8 g/dL    RDW 16.4 (H) 12.0 - 15.0 %    PLATELETS 195 140 - 450 x10^3/uL    MPV 8.5 7.5 - 11.5 fL   BASIC METABOLIC PANEL   Result Value Ref Range    SODIUM 128 (L) 136 - 145 mmol/L    POTASSIUM 4.2 3.5 - 5.1 mmol/L    CHLORIDE 104 96 - 111 mmol/L    CO2 TOTAL 19 (L) 22 - 32 mmol/L    ANION GAP 5 4 - 13 mmol/L    CALCIUM 8.3 (L) 8.5 - 10.2 mg/dL    GLUCOSE 235 (H) 65 - 139 mg/dL    BUN 19 8 - 25 mg/dL    CREATININE 0.57 0.49 -  1.10 mg/dL    BUN/CREA RATIO 33 (H) 6 - 22    ESTIMATED GFR >59 >59 mL/min/1.55m^2   MAGNESIUM   Result Value Ref Range    MAGNESIUM 1.3 (L) 1.6 - 2.5 mg/dL   PHOSPHORUS   Result Value Ref Range    PHOSPHORUS 3.7 2.3 - 4.0 mg/dL   HEPATIC FUNCTION PANEL   Result Value Ref Range    ALBUMIN 1.4 (L) 3.4 - 4.8 g/dL    ALKALINE PHOSPHATASE 88 <150 U/L    ALT (SGPT) 9 <55 U/L    AST (SGOT) 15 8 - 41 U/L    BILIRUBIN TOTAL 0.6 0.3 - 1.3 mg/dL    BILIRUBIN DIRECT 0.3 (H) <0.3 mg/dL    PROTEIN TOTAL 4.0 (L) 6.0 - 8.0 g/dL   POC BLOOD GLUCOSE (RESULTS)   Result Value Ref Range    GLUCOSE, POC 204 (H) 70 - 105 mg/dL     Radiology        Assessment/ Plan:      Active Hospital Problems    Diagnosis   . Primary Problem: Pancreatic cancer (CMS [facility])       This is a 71 y.o. female who is POD 88 s/p robotic whipple for pancreatic cancer.    - Diabetic diet  - TTE ordered, follow up results  - Continue with current pain control  - Continue with bowel regimen  - Appreciate nephro's recs, salt tabs to manage hyponatremia  - Appreciate endocrine's recs, appropriate insulin dosage has been ordered   - Dispo: SNF pending    Willa Rough, DO PGY-2  11/14/17 08:52   [facility] Department of Surgery      I saw and examined the patient.  I reviewed the resident's note.  I agree with the findings and plan of care as documented in the resident's note. Tolerating diet.  Drain serous.  We will wean to half TPN.    Lynwood Dawley, MD

## 2017-11-14 NOTE — Nurses Notes (Signed)
Assessments completed per flow sheets. Medications administered per MAR. Patient has no complaints of n/v/d/c. Patient rates pain at 8/10. Call bell and personal belongings within reach.

## 2017-11-14 NOTE — Nurses Notes (Signed)
Paged Juleen China of Surg Onc, [phone]- [place]- pt had small episode of vomiting, still nauseous, given Zofran

## 2017-11-14 NOTE — Care Management Notes (Signed)
[facility]  Care Management Note    Patient Name: [name]  Date of Birth: Jun 13, 1946  Sex: female  Date/Time of Admission: 10/25/2017  5:34 AM  Room/Bed: 975/A  Payor: MEDICARE / Plan: MEDICARE PART A AND B / Product Type: Medicare /    LOS: 20 days   PCP: No Pcp    Admitting Diagnosis:  Pancreatic cancer (CMS [place]) [C25.9]    Assessment:      11/14/17 1112   Assessment Details   Assessment Type Continued Assessment   Date of Care Management Update 11/14/17   Date of Next DCP Update 11/17/17   Care Management Plan   Discharge Planning Status plan in progress   Projected Discharge Date 11/15/17   CM will evaluate for rehabilitation potential yes   Patient choice offered to patient/family yes   Form for patient choice reviewed/signed and on chart no   Patient aware of possible cost for ambulance transport?  Yes   Discharge Needs Assessment   Discharge Facility/Level Of Care Needs [facility] Based (code 105)   Transportation Available ambulance       Discharge Plan:  [facility] Based (code 50)  Per service patient is to have TTE procedure today, and TPN to be 1/2 today, patient is not medically stable for discharge at this time.  [place] updated Ebony Hail at [facility] who states patient is still accepted 12/5.  [facility] to follow for discharge once patient medically stable.     The patient will continue to be evaluated for developing discharge needs.     Case Manager: Frederik Pear, RN  Phone: [phone]

## 2017-11-14 NOTE — Consults (Signed)
Nephrology Consult   Follow Up Note      [name],[name], 71 y.o. female  Date of Admission:  10/25/2017  Date of service: 11/14/2017  Date of Birth:  1946/05/29    Assessment:  [name] is a 71 y.o. female with hx of pancreatic cancer s/p robotic Whipple on 11/14 with hyponatremia    Hyponatremia  - likely due to thiazide diuretic + low solute intake post surgery. 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   - Urine studies now suggestive of volume depletion.     Recommendations:  - Cont salt tabs 3 g tid. Please restrict free water to 1200 ml/day  - Please call if sodium changes more than 8 meq/dl in 24 hours    Thank you for consulting Nephrology. Please call with any questions or concerns. We will continue to follow.    Subjective: Continues to have nausea. Having intermittent leg weakness.       Objective:  Physical Exam  Filed Vitals:    11/14/17 0723 11/14/17 0725 11/14/17 0735 11/14/17 1114   BP:  (!) 157/79  (!) 130/52   Pulse: 97   88   Resp: (!) 28   (!) 24   Temp:   36.6 C (97.9 F) 36.7 C (98.1 F)   SpO2: 100% 100%  100%     General: No apparent distress  Mouth:  Moist mucous membranes  Neck:  Supple, trachea midline, no JVD  Lungs:  CTAB, no wheezing  Heart:  RRR, no murmurs  Abdomen: Soft, tender in LLQ  Extremities: No cyanosis, no edema  Neuro:  Grossly normal  Skin:  Warm and dry      I/O last 24 hours:      Intake/Output Summary (Last 24 hours) at 11/14/17 1319  Last data filed at 11/14/17 1227   Gross per 24 hour   Intake             3204 ml   Output              530 ml   Net             2674 ml         Current Facility-Administered Medications   Medication Dose Route Frequency Provider Last Rate Last Dose   . acetaminophen (TYLENOL) tablet  650 mg Oral Q4H PRN Leida Lauth, MD   650 mg at 11/10/17 2019   . adult custom variable rate parenteral nutrition   Intravenous Continuous Idelle Jo, PA-C   Stopped at 11/14/17 0848   . adult custom variable rate  parenteral nutrition   Intravenous Continuous Nock, Christina M, PA-C       . atorvastatin (LIPITOR) tablet  10 mg Oral QPM Cleatrice Burke, MD   10 mg at 11/13/17 2110   . calcium carbonate (TUMS) chewable tablet  500 mg Oral Daily Josetta Huddle, MD   500 mg at 11/14/17 0801   . docusate sodium (COLACE) capsule  100 mg Oral 2x/day Cleatrice Burke, MD   Stopped at 11/12/17 0900   . enoxaparin PF (LOVENOX) 40 mg/0.4 mL SubQ injection  40 mg Subcutaneous Q24H Cosner, Adam, PA-C   40 mg at 11/14/17 6045   . hydrALAZINE (APRESOLINE) injection 10 mg  10 mg Intravenous Q4H PRN Tereasa Coop, MD   10 mg at 11/05/17 2317   . HYDROcodone-acetaminophen (NORCO) 5-325 mg per tablet  1 Tab Oral Q4H PRN Katrina Stack, MD   1 Tab  at 11/14/17 0801   . insulin NPH human 100 units/mL injection  50 Units Subcutaneous QPM Shela Commons, MD   50 Units at 11/13/17 2110   . insulin R human 100 units/mL injection  10 Units Subcutaneous Daily Shela Commons, MD   Stopped at 11/13/17 1930   . LORazepam (ATIVAN) tablet  0.5 mg Oral Daily PRN Cosner, Adam, PA-C   0.5 mg at 11/05/17 0411   . losartan (COZAAR) tablet  100 mg Oral Daily Cleatrice Burke, MD   100 mg at 11/14/17 0801   . magnesium oxide (MAG-OX) tablet  800 mg Oral 2x/day Leida Lauth, MD   800 mg at 11/14/17 0801   . metoprolol succinate (TOPROL-XL) 24 hr extended release tablet  50 mg Oral Daily Cleatrice Burke, MD   50 mg at 11/14/17 0801   . NS flush syringe  2 mL Intracatheter Q8HRS Cosner, Adam, PA-C   Stopped at 11/12/17 2200    And   . NS flush syringe  2-6 mL Intracatheter Q1 MIN PRN Cosner, Adam, PA-C   5 mL at 10/31/17 0844   . NS flush syringe  10-30 mL Intracatheter Q8HRS Nicola Police, MD   10 mL at 11/13/17 2111   . NS flush syringe  20-30 mL Intracatheter Q1 MIN PRN Nicola Police, MD   20 mL at 11/01/17 1744   . ondansetron (ZOFRAN) 2 mg/mL injection  4 mg Intravenous Q6H PRN Cosner, Adam, PA-C   4 mg at 11/12/17 1719   . pantoprazole  (PROTONIX) delayed release tablet  40 mg Oral Daily before Breakfast Katrina Stack, MD   40 mg at 11/14/17 3235   . perflutren lipid microspheres (DEFINITY) 1.1 mg/mL injection  0.3 mL Intravenous Give in Cardiology Willa Rough, DO       . polyethylene glycol [facility]) oral packet  17 g Oral Daily Cleatrice Burke, MD   Stopped at 11/12/17 0900   . potassium chloride 67meq per 31mL oral liquid  40 mEq Oral Daily with Breakfast Katrina Stack, MD   Stopped at 11/12/17 [phone]   . sennosides-docusate sodium (SENOKOT-S) 8.6-50mg  per tablet  1 Tab Oral 2x/day Cleatrice Burke, MD   Stopped at 11/12/17 0900   . simethicone (MYLICON) chewable tablet  80 mg Oral Q6H PRN Katrina Stack, MD   80 mg at 11/10/17 2016   . sodium chloride tablet  3 g Oral 3x/day-Meals Maryelizabeth Kaufmann, MD   3 g at 11/14/17 1227   . SSIP insulin R human (HUMULIN R) 100 units/mL injection  2-6 Units Subcutaneous 4x/day PRN Fonnie Birkenhead, MD   4 Units at 11/14/17 2025         Labs:  I have reviewed all lab values.   CBC   Recent Labs      11/12/17   0445  11/13/17   0548  11/13/17   0635  11/13/17   2122  11/14/17   0028   WBC  6.6  6.4   --    --   6.5   HGB  8.5*  6.7*  7.1*  8.2*  8.2*   HCT  22.3*  20.2*  21.3*  24.9*  25.0*   PLTCNT  204  194   --    --   195      Diff   No results for input(s): PMNS, LYMPHOCYTES, MYELOCYTES, MONOCYTES, EOSINOPHIL, BASOPHILS, NRBCS, PMNABS, LYMPHSABS, EOSABS, MONOSABS, BASOSABS in the last 72 hours.    Invalid input(s): PROMYELOCYTES, METAMYELOCYTES  BMP   Recent Labs      11/11/17   1858  11/12/17   0223  11/12/17   0642  11/13/17   0548  11/14/17   0028   SODIUM  128*  124*  124*  130*  128*   POTASSIUM   --    --   4.5  4.3  4.2   CHLORIDE   --    --   105  107  104   CO2   --    --   17*  20*  19*   BUN   --    --   27*  26*  19   CREATININE   --    --   0.58  0.50  0.57   GLUCOSENF   --    --   243*  98  235*   ANIONGAP   --    --   2*  3*  5   BUNCRRATIO   --    --   47*  52*  33*   GFR   --    --    >59  >59  >59        Urinalysis   No results for input(s): CHARACTER, COLOR, SPECGRAVUR, PHURINE, PROTEIN, GLUCOSE, KETONES, UROBILINOGEN, LEUKOCYTES, NITRITE, EPITHCEL, BACTERIA, MUCOUS, AMORPCRY, CALCIUMUR, CASTS, BILIRUBIN, BLOOD, SQUAEPIT in the last 72 hours.        Imaging:  I have reviewed all imaging studies.         Jennet Maduro, MD 11/14/2017, 13:19  PGY-5  Fellow - Nephrology  [place] saw and examined the patient. I reviewed the Fellow's note. I agree with the findings and plan of care as documented in the fellow's note, reviewed and signed today.   patient is still with hyponatremia I explained to her about the importance of having fluid restriction will continue on salt tablets 3 g 3 times a day since her urine osmolality is high she will need a significant amount of salt tablets in addition to fluid restriction.    Moshe Cipro, MD  11/14/2017, 17:02

## 2017-11-15 LAB — TRANSTHORACIC ECHOCARDIOGRAM - ADULT
AV mean gradient: 10
AV peak velocity post stress: 212
AVA VTI: 1
AVA Vmax: 1
Biplane Simpson EF: 55
EF: 61
Interventricular Septum Diastolic Thickness by 2D: 1 cm
LA Volume Index: 28
LVIDD - 2D: 5.2 cm
LVIDS 2D: 3.6 cm
LVPWD: 0.8 cm
MV E/A: 1
Please see Linked Document for Final Report.: ABNORMAL
Please see Linked Document for Final Report.: NORMAL
TR VELOCITY: 248

## 2017-11-15 LAB — BASIC METABOLIC PANEL
ANION GAP: 3 mmol/L — ABNORMAL LOW (ref 4–13)
BUN/CREA RATIO: 42 — ABNORMAL HIGH (ref 6–22)
BUN: 22 mg/dL (ref 8–25)
CALCIUM: 8.1 mg/dL — ABNORMAL LOW (ref 8.5–10.2)
CHLORIDE: 106 mmol/L (ref 96–111)
CO2 TOTAL: 22 mmol/L (ref 22–32)
CREATININE: 0.52 mg/dL (ref 0.49–1.10)
ESTIMATED GFR: >59 mL/min/1.73mˆ2 (ref >59)
GLUCOSE: 146 mg/dL — ABNORMAL HIGH (ref 65–139)
POTASSIUM: 4.3 mmol/L (ref 3.5–5.1)
SODIUM: 131 mmol/L — ABNORMAL LOW (ref 136–145)

## 2017-11-15 LAB — HEPATIC FUNCTION PANEL
ALBUMIN: 1.3 g/dL — ABNORMAL LOW (ref 3.4–4.8)
ALT (SGPT): 7 U/L (ref <55)
BILIRUBIN DIRECT: 0.1 mg/dL (ref <0.3)
PROTEIN TOTAL: 3.7 g/dL — ABNORMAL LOW (ref 6.0–8.0)

## 2017-11-15 LAB — CBC
HCT: 22 % — ABNORMAL LOW (ref 33.5–45.2)
HGB: 7.4 g/dL — ABNORMAL LOW (ref 11.2–15.2)
MCH: 28.9 pg (ref 27.4–33.0)
MCHC: 33.8 g/dL (ref 32.5–35.8)
MCV: 85.5 fL (ref 78.0–100.0)
MCV: 85.5 fL (ref 78.0–100.0)
MPV: 8 fL (ref 7.5–11.5)
PLATELETS: 181 x10ˆ3/uL (ref 140–450)
RBC: 2.57 x10ˆ6/uL — ABNORMAL LOW (ref 3.63–4.92)
RDW: 16.3 % — ABNORMAL HIGH (ref 12.0–15.0)
RDW: 16.3 % — ABNORMAL HIGH (ref 12.0–15.0)
WBC: 5.4 x10ˆ3/uL (ref 3.5–11.0)

## 2017-11-15 LAB — POC BLOOD GLUCOSE (RESULTS)
GLUCOSE, POC: 146 mg/dL — ABNORMAL HIGH (ref 70–105)
GLUCOSE, POC: 166 mg/dL — ABNORMAL HIGH (ref 70–105)
GLUCOSE, POC: 112 mg/dL — ABNORMAL HIGH (ref 70–105)
GLUCOSE, POC: 130 mg/dL — ABNORMAL HIGH (ref 70–105)

## 2017-11-15 LAB — LIPASE, BODY FLUID

## 2017-11-15 LAB — MAGNESIUM: MAGNESIUM: 1.8 mg/dL (ref 1.6–2.5)

## 2017-11-15 LAB — PHOSPHORUS: PHOSPHORUS: 4 mg/dL (ref 2.3–4.0)

## 2017-11-15 MED ORDER — POTASSIUM CHLORIDE ER 20 MEQ TABLET,EXTENDED RELEASE(PART/CRYST)
40.0000 meq | ORAL_TABLET | Freq: Once | ORAL | Status: AC
Start: 2017-11-15 — End: 2017-11-15
  Filled 2017-11-15: qty 2

## 2017-11-15 MED ADMIN — mupirocin 2 % topical ointment: ORAL | @ 08:00:00 | NDC 00168035222

## 2017-11-15 MED ADMIN — sodium chloride 0.9 % intravenous solution: @ 22:00:00 | NDC 00338004904

## 2017-11-15 MED ADMIN — tobramycin-dexamethasone 0.3 %-0.1 % eye ointment: ORAL | @ 09:00:00 | NDC 00065064835

## 2017-11-15 MED ADMIN — sodium chloride 0.9 % (flush) injection syringe: ORAL | @ 11:00:00

## 2017-11-15 NOTE — Consults (Signed)
[facility]  Cardiology CONSULT  Follow Up Note    [name],[name], 71 y.o. female  Date of Service: 11/15/2017  Date of Birth:  October 01, 1946    Hospital Day:  LOS: 21 days     Chief Complaint: Asymptomatic V-tach    Impression/Recommendations:  follow up with PCP     Objective:  Temperature: 36.6 C (97.9 F)  Heart Rate: 84  BP (Non-Invasive): (!) 137/46  Respiratory Rate: (!) 26  SpO2-1: 98 %  Pain Score (Numeric, Faces): 5  General: Appears in no acute distress, appears as stated age  Eyes: Conjunctiva clear, Pupils equal and round  HENT: ENMT without erythema or injection, mucous membranes moist.  Lungs: Even and nonlabored respiration. There is mild diffuse wheezes   Cardiovascular: normal rate. No murmurs or gallops  Abdomen: Soft, non-tender, non-distended, R JP drain with clear, serous output; LLQ port site dressing with some clear drainage, no purulence   Extremities: grossly moving all extremities  Skin: Skin warm and dry    Labs:  I have reviewed all lab results.    Imaging Studies:    XR CHEST PA AND LATERAL performed on 10/18/2017 5:22 PM.    REASON FOR EXAM: C25.9: Malignant neoplasm of pancreas, unspecified  location of malignancy (CMS HCC)    TECHNIQUE: 2 views/3 images submitted for interpretation.    COMPARISON: None    FINDINGS: There is no focal consolidation, pleural effusion, or  pneumothorax. The heart size.    Support devices:  Right IJ medication port with catheter tip projecting over the upper SVC.    IMPRESSION:  Unremarkable chest radiographs    Previous ECG results:  Sinus rhythm with frequent Premature ventricular complexes  Otherwise normal ECG  When compared with ECG of 12-Nov-2017 04:05,  No significant change was found    Assessment/Plan:      Active Hospital Problems    Diagnosis   . Primary Problem: Pancreatic cancer (CMS [facility])     [name] is a 71 y.o F with PMHs of asthma, sleep apnea, DM typeII and pancreatic cancer s/p whipple surgery presenting  with asymptomatic non-sustained V-tach and elevated troponin.     Hypertroponenemia; etiology is likely demand ischemia given the fluctuating hemoglobin and underlying coronary artery disease   -- repeat Troponin check, if up-trending continue trending   -- HGB is currently > 7 please consider blood transfusion as patient is potentially symptomatic and had recent HGB below 7  -- TTE is unremarkable with EF of 55%  -- Consider outpatient cardiac ischemia work-up     Non-sustained V-tach of ~4beats   -- Please monitor BMP daily and correct Potassium and Magnesium appropriately   -- Patient should follow-up with PCP or Pulmonologist for sleep apnea and asthma to assure appropriate treatment     DVT/PE Prophylaxis: Enoxaparin    We will sign off now. Thank you for the consult please call for any question.           Nunzio Cory, MD  11/15/2017, 15:00        I saw and examined the patient.  I reviewed the resident's note.  I agree with the findings and plan of care as documented in the resident's note.  Any exceptions/additions are edited/noted.    [name] Philips, MD

## 2017-11-15 NOTE — Care Plan (Signed)
[facility]  Occupational Therapy Progress Note    Patient Name: [name]  Date of Birth: August 28, 1946  Height:  154.9 cm (5\' 1" )  Weight:  102.2 kg (225 lb 5 oz)  Room/Bed: 975/A  Payor: MEDICARE / Plan: MEDICARE PART A AND B / Product Type: Medicare /     Assessment:    Pt tolerated OT treatment very well. Pt reports feeling weak during session but was able to complete bed mobility, LB dressing, toileting, toilet transfers, and standing grooming with SBA.  No LOB noted during session.  Pt reports her brother-in-law able to assist with IADLs if needed upon return home.  Recommend intermittent supervision at home with Home Health OT at d/c.       Discharge Needs:   Equipment Recommendation: none anticipated    Discharge Disposition:  home with assist, home with home health     JUSTIFICATION OF DISCHARGE RECOMMENDATION   Based on current diagnosis, functional performance prior to admission, and current functional performance, this patient requires continued OT services in home with assist, home with home health in order to achieve significant functional improvements.    Plan:   Continue to follow patient according to established plan of care.  The risks/benefits of therapy have been discussed with the patient/caregiver and he/she is in agreement with the established plan of care.     Subjective & Objective:        11/15/17 1107   Therapist Pager   OT Assigned/ Pager # Danae Chen [phone]   Rehab Session   Document Type therapy note (daily note)   Total OT Minutes: 27   Patient Effort good   General Information   Medical Lines PIV Line   Respiratory Status room air   Existing Precautions/Restrictions fall precautions;full code   Pre Treatment Status   Pre Treatment Patient Status Patient supine in bed;Call light within reach;Telephone within reach;Sitter select activated   Support Present Pre Treatment  None   Communication Pre Treatment  Nurse   Communication Pre Treatment Comment Pt  and nursing agreeable to OT treatment.    Mutuality/Individual Preferences   What Information Would Help [place] Give You More Personalized Care? Up with FWW SBA    Living Environment   Living Environment Comment Pt reports living with her sister and brother-in-law.  Pt's sister works 5 days per week.  Brother-in-law is home with her all of the time.  Pt reports brother-in-law would be able to assist with meals PRN.    Pain Assessment   Pain Scale: Numbers, Pretreatment 0/10 - no pain   Pain Scale: Numbers, Post-Treatment 0/10 - no pain   Coping/Psychosocial Response Interventions   Plan Of Care Reviewed With patient   Cognitive Assessment/Interventions   Behavior/Mood Observations alert;cooperative;flat affect   Attention WNL/WFL   Follows Commands  WNL   RUE Assessment   RUE Assessment WFL- Within Functional Limits   LUE Assessment   LUE Assessment WFL- Within Functional Limits   RLE Assessment   RLE Assessment WFL- Within Functional Limits   LLE Assessment   LLE Assessment WFL- Within Functional Limits   Mobility Assessment/Training   Additional Documentation Gait Assessment/Treatment (Group)   Bed Mobility Assessment/Treatment   Bed Mobility, Assistive Device bed rails   Supine-Sit [place] stand-by assistance   Sit to Supine, Independence supervision required   Comment  Several attempts for sit-supine transfer.  Pt eventually able to complete transfers without assist.    Transfer Assessment/Treatment   Sit-Stand  [place]ependence  stand-by assistance   Stand-Sit [place]  stand-by assistance   Sit-Stand-Sit, Assist Device front wheeled walker   Toilet Transfer [place] stand-by assistance   Toilet Transfer Assist Device [facility] and FWW )   Transfer Comment Pt completed toilet transfer twice during session.  Pt was even able to adjust the [facility] over the toilet without LOB before using the bathroom.    Gait Assessment/Treatment   Independence  stand-by assistance   Assistive Device  rolling walker   Distance in Feet  8x2   Comment No LOB or safety concerns regarding ambulation to and from the bathroom with FWW.    Lower Body Dressing Assessment/Training   Position sitting;standing   Independence Level  standby assist   Comment Pt SBA to don and doff hospital pants sitting EOB.  Pt required increased time to complete task but was not unsafe in doing so.    Toileting Assessment/Training   Position sitting;standing   Independence Level  standby assist   Comment Pt demonstrated ability to complete clothing management and hygiene tasks safely.  Pt even able to reach to the floor from standing position to pull pants up.  No LOB during toileting tasks.    Grooming Assessment/Training   Position standing   Independence Level standby assist   Comment Pt stood at the sink with SBA to brush teeth.  No safety concerns or LOB noted during grooming.    IADL Evaluation   IADL Comments Pt likely able to ambulation to the kitchen to complete small meal prep upon return home.  Pt reports her brother-in-law would be able to assist with simple IADLs if needed.    Balance Skill Training   Sitting Balance: Static good balance   Sitting, Dynamic (Balance) good balance   Sit-to-Stand Balance good balance   Standing Balance: Static good balance   Standing Balance: Dynamic good balance   Post Treatment Status   Post Treatment Patient Status Patient supine in bed;Call light within reach;Telephone within reach;Sitter select activated   Support Present Environmental education officer Post Treatment Comment Nursing notified that abdominal dressing was removed by the patient during toileting session.     Occupational Therapy Clinical Impression   Functional Level at Time of Session Pt tolerated OT treatment very well. Pt reports feeling weak during session but was able to complete bed mobility, LB dressing, toileting, toilet transfers, and standing grooming with SBA.  No LOB noted during session.  Pt reports her  brother-in-law able to assist with IADLs if needed upon return home.  Recommend intermittent supervision at home with Home Health OT at d/c.    Anticipated Equipment Needs at Discharge none anticipated   Anticipated Discharge Disposition home with assist;home with home health       Therapist:   Terrill Mohr, OT  Pager #: [phone]

## 2017-11-15 NOTE — Progress Notes (Signed)
Endocrine:    [name] sitting up eating blood sugars are as below  Recent Labs      11/13/17   1610  11/13/17   2227  11/14/17   0622  11/14/17   1034  11/14/17   1113  11/14/17   1623  11/14/17   2108  11/15/17   0619  11/15/17   1124   GLUIP  105  244*  204*  129*  122*  78  183*  146*  166*       She is receiving NPH at the start of TPN which was reduced by half last evening.  BP (!) 137/46  Pulse 84  Temp 36.6 C (97.9 F)  Resp (!) 26  Ht 1.549 m (5\' 1" )  Wt 102.2 kg (225 lb 5 oz)  SpO2 98%  BMI 42.57 kg/m2      Relatively well-appearing female post pancreatic surgery blood sugars as above discussed with nurse about feedings.. I have stopped the NPH in anticipation of discontinuing TPN if the TPN is continued would give half the dose of NPH.  I will text [facility]  Shela Commons, MD  11/15/2017, 15:22

## 2017-11-15 NOTE — Care Management Notes (Signed)
[facility]  Care Management Note    Patient Name: [name]  Date of Birth: 01/28/1946  Sex: female  Date/Time of Admission: 10/25/2017  5:34 AM  Room/Bed: 975/A  Payor: MEDICARE / Plan: MEDICARE PART A AND B / Product Type: Medicare /    LOS: 21 days   PCP: No Pcp    Admitting Diagnosis:  Pancreatic cancer (CMS [facility]) [C25.9]    Assessment:      11/15/17 1330   Assessment Details   Assessment Type Continued Assessment   Date of Care Management Update 11/15/17   Date of Next DCP Update 11/17/17   Care Management Plan   Discharge Planning Status plan in progress   Projected Discharge Date 11/16/17   CM will evaluate for rehabilitation potential yes   Patient choice offered to patient/family yes   Form for patient choice reviewed/signed and on chart no   Facility or [place]    Patient aware of possible cost for ambulance transport?  Yes   Discharge Needs Assessment   Discharge Facility/Level Of Care Needs Home with Home Health (code 6)   Transportation Available family or friend will provide       Discharge Plan:  Home with Home Health (code 6)  Per service patient is stable for discharge to SNF at this time, JP Drain to be pulled today and TPN to be discontinued.  [place] spoke with Ebony Hail at [facility] who states will need updated prog notes and PT notes due to TPN being discontinued patient may not meet skillable need.  [place] spoke with PT/OT to work with patient and evaluate.  Per PT/OT recs patient recommended home with assist/home with home health.  [place] updated Ebony Hail at [facility] who states please give patient/family information so in case patient requires skilled nursing placement once going home their facility could still be available.  [facility] let contact information with patient/sister.  Patient has choice on file for [facility] from prior discharge plan, patient agreeable to Home with Home Health through [facility].  [place] resent referral to  [facility] and contact hospital liason Elmyra Ricks and updated.  Number placed in AVS for nursing to call report.  Patient sister, [name], updated via telephone [phone] who states good with discharge plan and will be here tomorrow to transport sister home.  [facility] to follow.     The patient will continue to be evaluated for developing discharge needs.     Case Manager: Frederik Pear, RN  Phone: [phone]

## 2017-11-15 NOTE — Progress Notes (Signed)
[facility]  Surgical Oncology Progress Note    [name],[name], 71 y.o. female  Date of Birth:  1946/12/05  Date of Admission:  10/25/2017  Date of service: 11/15/2017     Subjective: NAEO.  Tolerating regular diet. Patient has no new complaints.    Physical Exam:  BP (!) 150/44  Pulse 89  Temp 36.9 C (98.4 F)  Resp (!) 21  Ht 1.549 m (5\' 1" )  Wt 102.2 kg (225 lb 5 oz)  SpO2 98%  BMI 42.57 kg/m2    General: Appears in no acute distress, appears as stated age  Eyes: Conjunctiva clear., Pupils equal and round.   HENT:ENMT without erythema or injection, mucous membranes moist.  Lungs: Even and nonlabored respiration  Cardiovascular: normal rate  Abdomen: Soft, non-tender, non-distended, R JP drain with clear, serous output; LLQ port site dressing with some clear drainage, no purulence   Extremities: grossly moving all extremities  Skin: Skin warm and dry    I/O:    Date 11/14/17 0700 - 11/15/17 0659 11/15/17 0700 - 11/16/17 0659   Shift 0700-1459 1500-2259 2300-0659 24 Hour Total 0700-1459 1500-2259 2300-0659 24 Hour Total   I  N  T  A  K  E   P.O. 500 240 60 800          Oral 500 240 60 800        I.V.  (mL/kg/hr) 395  (0.48) 200  (0.24) 640  (0.78) 1235  (0.5) 80   80      IVPB Volume 50   50          Volume (adult custom variable rate parenteral nutrition) 345   345          Volume (adult custom variable rate parenteral nutrition)  200 640 840 80   80    Shift Total  (mL/kg) 895  (8.73) 440  (4.29) 700  (6.85) 2035  (19.91) 80  (0.78)   80  (0.78)   O  U  T  P  U  T   Urine  (mL/kg/hr) 200  (0.24) 300  (0.37) 700  (0.86) 1200  (0.49)          Urine (Voided) 200 [phone]) 607-2620          Urine Occurrence 2 x   2 x 1 x   1 x    Emesis              Emesis Occurrence 0 x 1 x  1 x        Drains 260 160 115 535 80   80      Drain Output (Drain (Miscellaneous) JP Right Abdomen) 260 160 115 535 80   80    Stool              Stool Occurrence 0 x   0 x        Shift Total  (mL/kg) 460  (4.49) 460   (4.49) 815  (7.97) 1735  (16.98) 80  (0.78)   80  (0.78)   Weight (kg) 102.5 102.5 102.2 102.2 102.2 102.2 102.2 102.2       Nutrition/Residuals:  MNT PROTOCOL FOR DIETITIAN  DIETARY ORAL SUPPLEMENTS Oral Supplements with tray: Ensure Clear-Apple; BREAKFAST/LUNCH/DINNER; 1 Can  adult custom variable rate parenteral nutrition  DIET DIABETIC Calorie amount: CC 2000; Restrict fluids to: 1200 ML    Labs  Reviewed:   Lab Results Today:    Results  for orders placed or performed during the hospital encounter of 10/25/17 (from the past 24 hour(s))   POC BLOOD GLUCOSE (RESULTS)   Result Value Ref Range    GLUCOSE, POC 129 (H) 70 - 105 mg/dL   POC BLOOD GLUCOSE (RESULTS)   Result Value Ref Range    GLUCOSE, POC 122 (H) 70 - 105 mg/dL   SODIUM, RANDOM URINE   Result Value Ref Range    SODIUM RANDOM URINE 38 No Reference Range Established mmol/L   OSMOLALITY, RANDOM URINE   Result Value Ref Range    OSMOLALITY URINE 635 50 - 1400 mOsm/kg   POC BLOOD GLUCOSE (RESULTS)   Result Value Ref Range    GLUCOSE, POC 78 70 - 105 mg/dL   SODIUM   Result Value Ref Range    SODIUM 131 (L) 136 - 145 mmol/L   POC BLOOD GLUCOSE (RESULTS)   Result Value Ref Range    GLUCOSE, POC 183 (H) 70 - 105 mg/dL   CBC   Result Value Ref Range    WBC 5.4 3.5 - 11.0 x10^3/uL    RBC 2.57 (L) 3.63 - 4.92 x10^6/uL    HGB 7.4 (L) 11.2 - 15.2 g/dL    HCT 22.0 (L) 33.5 - 45.2 %    MCV 85.5 78.0 - 100.0 fL    MCH 28.9 27.4 - 33.0 pg    MCHC 33.8 32.5 - 35.8 g/dL    RDW 16.3 (H) 12.0 - 15.0 %    PLATELETS 181 140 - 450 x10^3/uL    MPV 8.0 7.5 - 11.5 fL   BASIC METABOLIC PANEL   Result Value Ref Range    SODIUM 131 (L) 136 - 145 mmol/L    POTASSIUM 4.3 3.5 - 5.1 mmol/L    CHLORIDE 106 96 - 111 mmol/L    CO2 TOTAL 22 22 - 32 mmol/L    ANION GAP 3 (L) 4 - 13 mmol/L    CALCIUM 8.1 (L) 8.5 - 10.2 mg/dL    GLUCOSE 146 (H) 65 - 139 mg/dL    BUN 22 8 - 25 mg/dL    CREATININE 0.52 0.49 - 1.10 mg/dL    BUN/CREA RATIO 42 (H) 6 - 22    ESTIMATED GFR >59 >59 mL/min/1.65m^2    MAGNESIUM   Result Value Ref Range    MAGNESIUM 1.8 1.6 - 2.5 mg/dL   PHOSPHORUS   Result Value Ref Range    PHOSPHORUS 4.0 2.3 - 4.0 mg/dL   HEPATIC FUNCTION PANEL   Result Value Ref Range    ALBUMIN 1.3 (L) 3.4 - 4.8 g/dL    ALKALINE PHOSPHATASE 71 <150 U/L    ALT (SGPT) 7 <55 U/L    AST (SGOT) 11 8 - 41 U/L    BILIRUBIN TOTAL 0.2 (L) 0.3 - 1.3 mg/dL    BILIRUBIN DIRECT 0.1 <0.3 mg/dL    PROTEIN TOTAL 3.7 (L) 6.0 - 8.0 g/dL   POC BLOOD GLUCOSE (RESULTS)   Result Value Ref Range    GLUCOSE, POC 146 (H) 70 - 105 mg/dL     Assessment/ Plan:      Active Hospital Problems    Diagnosis   . Primary Problem: Pancreatic cancer (CMS [facility])       This is a 71 y.o. female who is POD 22 s/p robotic whipple for pancreatic cancer.    - Diabetic diet  -Patient's chyle leak appears to have resolved.  Will stop her TPN today  - Continue with current  pain control  - Continue with bowel regimen   - Appreciate nephro's recs, salt tabs to manage hyponatremia  - Appreciate endocrine's recs, appropriate insulin dosage has been ordered   - Dispo: SNF pending    Idelle Jo, PA-C  11/15/2017, 08:26    I personally saw and examined the patient. See physician's assistant note for additional details.     Drain output is serous after tolerating regular diet with gravy. It appears the chyle leak has resolved.  Removed drain and stop TPN.    Monitor p.o. intake.  Discharge to SNF pending    Lynwood Dawley, MD

## 2017-11-15 NOTE — Care Plan (Signed)
Problem: Patient Care Overview (Adult,OB)  Goal: Plan of Care Review(Adult,OB)  The patient and/or their representative will communicate an understanding of their plan of care   Outcome: Ongoing (see interventions/notes)  Assessed pt as per flowsheet, pt is having some abdominal pain and taking prn pain medication.  She is up with assist of one and a walker, pt to chair and bathroom and walked the hallway today.  Pt eating well and had a bm today.  She has some edema.  Pt on room air.  Pt HR is irregular, spoke with service about this.

## 2017-11-15 NOTE — Care Plan (Signed)
[facility]  Physical Therapy Progress Note      Patient Name: [name]  Date of Birth: 1945/12/14  Height:  154.9 cm (5\' 1" )  Weight:  102.2 kg (225 lb 5 oz)  Room/Bed: 975/A  Payor: MEDICARE / Plan: MEDICARE PART A AND B / Product Type: Medicare /     Assessment:     Patient with continued improvement in mobility, will continue to make slow strength gains if increases activity and nutrition. Will continue to follow for progressive strengthening and mobility training. Recommend dch home with HHPT and family supervision.    Discharge Needs:   Equipment Recommendation: none anticipated        Discharge Disposition: home with assist, home with home health    JUSTIFICATION OF DISCHARGE RECOMMENDATION   Based on current diagnosis, functional performance prior to admission, and current functional performance, this patient requires continued PT services in home with assist, home with home health in order to achieve significant functional improvements in these deficit areas: aerobic capacity/endurance, gait, locomotion, and balance.      Plan:   Continue to follow patient according to established plan of care.  The risks/benefits of therapy have been discussed with the patient/caregiver and he/she is in agreement with the established plan of care.     Subjective & Objective:        11/15/17 1004   Therapist Pager   PT Assigned/ Pager # sally [phone]   Rehab Session   Document Type therapy note (daily note)   Total PT Minutes: 11   Patient Effort good   Mutuality/Individual Preferences   What Information Would Help [place] Give You More Personalized Care? ambulate with supervisionx1, FWW   Pre Treatment Status   Pre Treatment Patient Status Patient supine in bed   Communication Pre Treatment  Nurse   Bed Mobility Assessment/Treatment   Supine-Sit Independence modified independence   Sit to Supine, Independence minimum assist (75% patient effort)   Comment  Patient requires assist to bring  BLE into bed after ambulation   Transfer Assessment/Treatment   Sit-Stand Independence  modified independence   Stand-Sit Independence  modified independence   Sit-Stand-Sit, Assist Device front wheeled walker   Transfer Comment Sit<>stand from EOB without difficulty, appropriate hand placement and safety awareness.   Gait Assessment/Treatment   Independence  supervision required   Assistive Device  rolling walker   Distance in Feet 250   Total Distance Ambulated 250   Comment Patient ambulated with steady balance, no rest breaks required. No imbalance or instability noted. Patient with reciprocal gait pattern, equal step length. Reports weakness, recommended increased PO intake.   Balance Skill Training   Sitting Balance: Static good balance   Sitting, Dynamic (Balance) good balance   Sit-to-Stand Balance good balance   Standing Balance: Static good balance   Standing Balance: Dynamic good balance   Therapeutic Exercise/Activity   Comment encouraged increased activity and nutrition   Post Treatment Status   Post Treatment Patient Status Patient supine in bed   Plan of Care Review   Plan Of Care Reviewed With patient   Physical Therapy Clinical Impression   Assessment Patient with continued improvement in mobility, will continue to make slow strength gains if increases activity and nutrition. Will continue to follow for progressive strengthening and mobility training. Recommend dch home with HHPT and family supervision.   Anticipated Equipment Needs at Discharge (PT Clinical Impression) none anticipated   Anticipated Discharge Disposition  home with  assist;home with home health       Therapist:   Stephani Police, PT   Pager #: [phone]

## 2017-11-15 NOTE — Care Plan (Signed)
Problem: Patient Care Overview (Adult,OB)  Goal: Plan of Care Review(Adult,OB)  The patient and/or their representative will communicate an understanding of their plan of care   Outcome: Ongoing (see interventions/notes)      Problem: Nutrition, Parenteral (Adult)  Prevent and manage potential problems including:  1. fluid/electrolyte imbalance  2. glycemic control impaired  3. infection  4. infusion-related complications  5. malnutrition  Goal: Signs and Symptoms of Listed Potential Problems Will be Absent, Minimized or Managed (Nutrition, Parenteral)  Signs and symptoms of listed potential problems will be absent, minimized or managed by discharge/transition of care (reference Nutrition, Parenteral (Adult) CPG).  Outcome: Ongoing (see interventions/notes)      Medical Nutrition Therapy Follow Up        SUBJECTIVE : Pt denied N/V, said she threw up yesterday though and feels weak.  Eating 50-75% of meals.     OBJECTIVE:     Current Diet Order/Nutrition Support:    MNT PROTOCOL FOR DIETITIAN  DIETARY ORAL SUPPLEMENTS Oral Supplements with tray: Ensure Clear-Apple; BREAKFAST/LUNCH/DINNER; 1 Can  adult custom variable rate parenteral nutrition  DIET DIABETIC Calorie amount: CC 2000; Restrict fluids to: 1200 ML  DIETARY ORAL SUPPLEMENTS Oral Supplements with tray: Magic Cup-Chocolate; LUNCH/DINNER; 1 Can provides  TPN  TPN cut in half yesterday            Height Used for Calculations: 154.9 cm (5\' 1" )  Weight Used For Calculations: 85.9 kg (189 lb 6 oz)  Weight Trends: 11/29- 90.4 kg , 12/3- 98.7 kg standing wt  BMI (kg/m2): 35.86    IBW: 47.7 kg   %IBW: 180 %   Adj BW: 57.2 kg         Estimated Needs:    Energy Calorie Requirements: 1600-1700 per day (28-30 kcals/57.2 kg Adj. BW)  Protein Requirements (gms/day): 57-67 per day (1.2-1.4g/47.7 kg IBW)  Fluid Requirements: 1600-1700 per day (28-30 mLs/57.2 kg Adj. BW)    Comments: 71 y.o. female 6 Days Post-Op robotic whipple for pancreatic cancer, severely fatigued with  poor appetite, developed chyle leak and was started on TPN    Recommend :   Continue current diet at this time  To discontinue TPN today  Will change supplements to ensure HP TID  Question if FR can be lifted.   Monitor weekly weights.   Will follow.       Nutrition Diagnosis: Altered GI function related to chyle leak and poor appetite as evidenced by need for supplemental TPN (resolved)     Dierdre Searles, RD, LD, CNSC 11/15/2017, 13:39  Pager [phone]

## 2017-11-16 LAB — CBC WITH DIFF
BASOPHIL #: 0.04 10*3/uL (ref 0.00–0.20)
BASOPHIL %: 1 %
EOSINOPHIL %: 6 %
HCT: 22.5 % — ABNORMAL LOW (ref 33.5–45.2)
HGB: 7.5 g/dL — ABNORMAL LOW (ref 11.2–15.2)
LYMPHOCYTE #: 0.35 10*3/uL — ABNORMAL LOW (ref 1.00–4.80)
LYMPHOCYTE %: 7 %
MCH: 28.7 pg (ref 27.4–33.0)
MCHC: 33.5 g/dL (ref 32.5–35.8)
MCV: 85.7 fL (ref 78.0–100.0)
MONOCYTE %: 12 %
MPV: 7.7 fL (ref 7.5–11.5)
NEUTROPHIL %: 75 %
PLATELETS: 176 10*3/uL (ref 140–450)
RDW: 16.1 % — ABNORMAL HIGH (ref 12.0–15.0)
WBC: 5.3 10*3/uL (ref 3.5–11.0)

## 2017-11-16 LAB — BASIC METABOLIC PANEL
ANION GAP: 2 mmol/L — ABNORMAL LOW (ref 4–13)
BUN/CREA RATIO: 30 — ABNORMAL HIGH (ref 6–22)
BUN: 17 mg/dL (ref 8–25)
CALCIUM: 8.1 mg/dL — ABNORMAL LOW (ref 8.5–10.2)
CHLORIDE: 104 mmol/L (ref 96–111)
CO2 TOTAL: 25 mmol/L (ref 22–32)
CREATININE: 0.56 mg/dL (ref 0.49–1.10)
ESTIMATED GFR: >59 mL/min/{1.73_m2} (ref >59)
GLUCOSE: 143 mg/dL — ABNORMAL HIGH (ref 65–139)
GLUCOSE: 143 mg/dL — ABNORMAL HIGH (ref 65–139)
POTASSIUM: 4.4 mmol/L (ref 3.5–5.1)
SODIUM: 131 mmol/L — ABNORMAL LOW (ref 136–145)

## 2017-11-16 LAB — POC BLOOD GLUCOSE (RESULTS): GLUCOSE, POC: 99 mg/dL (ref 70–105)

## 2017-11-16 LAB — MAGNESIUM: MAGNESIUM: 1.4 mg/dL — ABNORMAL LOW (ref 1.6–2.5)

## 2017-11-16 LAB — PHOSPHORUS: PHOSPHORUS: 3.9 mg/dL (ref 2.3–4.0)

## 2017-11-16 MED ORDER — MAGNESIUM SULFATE 2 GRAM/50 ML (4 %) IN WATER INTRAVENOUS PIGGYBACK
2.0000 g | INJECTION | Freq: Once | INTRAVENOUS | Status: AC
Start: 2017-11-16 — End: 2017-11-16
  Administered 2017-11-16: 2 g via INTRAVENOUS
  Filled 2017-11-16: qty 50

## 2017-11-16 MED ADMIN — albuterol sulfate concentrate 2.5 mg/0.5 mL solution for nebulization: ORAL | @ 09:00:00

## 2017-11-16 MED ADMIN — propofol 10 mg/mL intravenous emulsion: @ 06:00:00

## 2017-11-16 NOTE — Progress Notes (Signed)
[facility]  Surgical Oncology Progress Note    [name], 71 y.o. female  Date of Birth:  [birth year]  Date of Admission:  10/25/2017  Date of service: 11/16/2017     Subjective: NAEO.  Tolerating diabetic diet. TPN stopped yesterday.  Patient has no new complaints. Drain removed yesterday.     Physical Exam:  BP (!) 147/65 Comment: RN notified  Pulse 79  Temp 36.9 C (98.4 F)  Resp 18  Ht 1.549 m (5\' 1" )  Wt 99.2 kg (218 lb 11.1 oz)  SpO2 92%  BMI 41.32 kg/m2    General: Appears in no acute distress, appears as stated age  Eyes: Conjunctiva clear., Pupils equal and round.   HENT:ENMT without erythema or injection, mucous membranes moist.  Lungs: Even and nonlabored respiration  Cardiovascular: normal rate  Abdomen: Soft, non-tender, non-distended, R JP drain with clear, serous output; LLQ port site dressing with some clear drainage, no purulence   Extremities: grossly moving all extremities  Skin: Skin warm and dry    I/O:    Date 11/15/17 0700 - 11/16/17 0659 11/16/17 0700 - 11/17/17 0659   Shift 0700-1459 1500-2259 2300-0659 24 Hour Total 0700-1459 1500-2259 2300-0659 24 Hour Total   I  N  T  A  K  E   P.O. 240 390 170 800 550   550      Oral 240 390 170 800 550   550    I.V.  (mL/kg/hr) 120  (0.15)   120  (0.05)          Volume (adult custom variable rate parenteral nutrition) 120   120        Shift Total  (mL/kg) 360  (3.52) 390  (3.82) 170  (1.71) 920  (9.27) 550  (5.54)   550  (5.54)   O  U  T  P  U  T   Urine  (mL/kg/hr)              Urine Occurrence 1 x 4 x 2 x 7 x        Emesis              Emesis Occurrence  0 x 0 x 0 x        Drains 80   80          Drain Output ([REMOVED] Drain (Miscellaneous) JP Right Abdomen) 80   80        Stool              Stool Occurrence 1 x 0 x 1 x 2 x        Shift Total  (mL/kg) 80  (0.78)   80  (0.81)       Weight (kg) 102.2 102.2 99.2 99.2 99.2 99.2 99.2 99.2       Nutrition/Residuals:  MNT PROTOCOL FOR DIETITIAN  DIET DIABETIC Calorie  amount: CC 2000; Restrict fluids to: 1200 ML  DIETARY ORAL SUPPLEMENTS Oral Supplements with tray: Ensure High Protein-Chocolate; BREAKFAST/LUNCH/DINNER; 1 Can    Labs  Reviewed:   Lab Results Today:    Results for orders placed or performed during the hospital encounter of 10/25/17 (from the past 24 hour(s))   POC BLOOD GLUCOSE (RESULTS)   Result Value Ref Range    GLUCOSE, POC 166 (H) 70 - 105 mg/dL   POC BLOOD GLUCOSE (RESULTS)   Result Value Ref Range    GLUCOSE, POC 112 (H) 70 - 105 mg/dL  POC BLOOD GLUCOSE (RESULTS)   Result Value Ref Range    GLUCOSE, POC 130 (H) 70 - 105 mg/dL   BASIC METABOLIC PANEL   Result Value Ref Range    SODIUM 131 (L) 136 - 145 mmol/L    POTASSIUM 4.4 3.5 - 5.1 mmol/L    CHLORIDE 104 96 - 111 mmol/L    CO2 TOTAL 25 22 - 32 mmol/L    ANION GAP 2 (L) 4 - 13 mmol/L    CALCIUM 8.1 (L) 8.5 - 10.2 mg/dL    GLUCOSE 143 (H) 65 - 139 mg/dL    BUN 17 8 - 25 mg/dL    CREATININE 0.56 0.49 - 1.10 mg/dL    BUN/CREA RATIO 30 (H) 6 - 22    ESTIMATED GFR >59 >59 mL/min/1.41m^2   MAGNESIUM   Result Value Ref Range    MAGNESIUM 1.4 (L) 1.6 - 2.5 mg/dL   PHOSPHORUS   Result Value Ref Range    PHOSPHORUS 3.9 2.3 - 4.0 mg/dL   CBC WITH DIFF   Result Value Ref Range    WBC 5.3 3.5 - 11.0 x10^3/uL    RBC 2.63 (L) 3.63 - 4.92 x10^6/uL    HGB 7.5 (L) 11.2 - 15.2 g/dL    HCT 22.5 (L) 33.5 - 45.2 %    MCV 85.7 78.0 - 100.0 fL    MCH 28.7 27.4 - 33.0 pg    MCHC 33.5 32.5 - 35.8 g/dL    RDW 16.1 (H) 12.0 - 15.0 %    PLATELETS 176 140 - 450 x10^3/uL    MPV 7.7 7.5 - 11.5 fL    NEUTROPHIL % 75 %    LYMPHOCYTE % 7 %    MONOCYTE % 12 %    EOSINOPHIL % 6 %    BASOPHIL % 1 %    NEUTROPHIL # 3.99 1.50 - 7.70 x10^3/uL    LYMPHOCYTE # 0.35 (L) 1.00 - 4.80 x10^3/uL    MONOCYTE # 0.62 0.30 - 1.00 x10^3/uL    EOSINOPHIL # 0.30 0.00 - 0.50 x10^3/uL    BASOPHIL # 0.04 0.00 - 0.20 x10^3/uL   POC BLOOD GLUCOSE (RESULTS)   Result Value Ref Range    GLUCOSE, POC 99 70 - 105 mg/dL     Assessment/ Plan:      Active Hospital  Problems    Diagnosis   . Primary Problem: Pancreatic cancer (CMS [facility])       This is a 71 y.o. female who is POD 22 s/p robotic whipple for pancreatic cancer.    - Diabetic diet  - Drain removed  - Continue with current pain control  - Continue with bowel regimen   - D/C home today    Willa Rough, DO PGY-2 11/16/17 09:22   [facility] Department of Surgery      I saw and examined the patient.  I reviewed the resident's note.  I agree with the findings and plan of care as documented in the resident's note.      Doing much better.  Tolerating a diet.  Off TPN.  Sodium improved.    PT cleared for discharge home. Plan for discharge today.  Patient was encouraged to call immediately with any questions or concerns. We will see her back in the office with lab work.    Lynwood Dawley, MD

## 2017-11-16 NOTE — Care Plan (Signed)
Problem: Patient Care Overview (Adult,OB)  Goal: Plan of Care Review(Adult,OB)  The patient and/or their representative will communicate an understanding of their plan of care   Outcome: Ongoing (see interventions/notes)    Goal: Individualization/Patient Specific Goal(Adult/OB)  Outcome: Ongoing (see interventions/notes)      Problem: Nutrition, Imbalanced: Inadequate Oral Intake (Adult)  Goal: Improved Oral Intake  Patient will demonstrate the desired outcomes by discharge/transition of care.   Outcome: Ongoing (see interventions/notes)    Goal: Prevent Further Weight Loss  Patient will demonstrate the desired outcomes by discharge/transition of care.   Outcome: Ongoing (see interventions/notes)      Problem: Diarrhea (Adult)  Goal: Improved/Reduced Symptoms  Patient will demonstrate the desired outcomes by discharge/transition of care.   Outcome: Ongoing (see interventions/notes)      Problem: Fall Risk (Adult)  Goal: Absence of Falls  Patient will demonstrate the desired outcomes by discharge/transition of care.   Outcome: Ongoing (see interventions/notes)      Problem: Skin Injury Risk (Adult,Obstetrics,Pediatric)  Goal: Skin Health and Integrity  Patient will demonstrate the desired outcomes by discharge/transition of care.   Outcome: Ongoing (see interventions/notes)

## 2017-11-16 NOTE — Nurses Notes (Signed)
Pt is being discharged to home on home health care with [facility] with her sister. Pts PICC was taken out, intact. Pt educated on discharge instructions and given discharge summary. All questions have been encouraged and answered. Report called to Cipriano Bunker at [facility] and they will be getting in touch with pt later this evening to see her tomorrow. Sherre Lain, RN  11/16/2017, 11:52

## 2017-11-17 ENCOUNTER — Ambulatory Visit (INDEPENDENT_AMBULATORY_CARE_PROVIDER_SITE_OTHER): Payer: Self-pay | Admitting: SURGICAL ONCOLOGY

## 2017-11-17 ENCOUNTER — Other Ambulatory Visit (HOSPITAL_COMMUNITY): Payer: Self-pay

## 2017-11-17 NOTE — Care Management Notes (Signed)
CCC received telephone call from [place] at [facility] [phone]) asking for wound care orders for patient.  [place] contact [place], Bernie with surgical oncology.  Orders place and sent in referral in [employer].

## 2017-11-17 NOTE — Telephone Encounter (Signed)
Called [place] back.  Informed her that I looked into her discharge summary and instructed her Comments   Wound Care Order:     Patient may shower  Please pack wound daily with 2in Kerlex dressing (patient was supplied with dressing at discharge)     Mila Homer, [place] COORDINATOR  11/17/2017, 16:36

## 2017-11-17 NOTE — Telephone Encounter (Signed)
-----   Message from [place] sent at 11/17/2017  4:20 PM EST -----  Luz Brazen from [facility] is calling and states that she needs clarification on wound orders. She states that today it had an odor today when she changed it and was really red. Please call her to clarify.     Thank you

## 2017-11-21 ENCOUNTER — Ambulatory Visit (INDEPENDENT_AMBULATORY_CARE_PROVIDER_SITE_OTHER): Payer: Self-pay | Admitting: SURGICAL ONCOLOGY

## 2017-11-21 NOTE — Telephone Encounter (Signed)
Called Jessica back.  Concerned about patient's wound.  Changing dressing every 2 hours due to large amount of drainage.  Foul smelling odor.  Denies fevers.  Appointment made for tomorrow 11/22/17 at 12:30.  Daughter grateful for appointment.  Mila Homer, CLINICAL CARE COORDINATOR  11/21/2017, 12:14

## 2017-11-21 NOTE — Telephone Encounter (Signed)
Regarding: abdominal abscess   ----- Message from Daphine Deutscher sent at 11/21/2017  8:34 AM EST -----  Druscilla Brownie with [facility] home health called stating the patient's wound is possibly needing debridement. States the patient has an abdominal abscess. She is wanting to know if she needs to bring the patient in for a sooner appointment. Please advise. Thank you

## 2017-11-22 ENCOUNTER — Encounter (INDEPENDENT_AMBULATORY_CARE_PROVIDER_SITE_OTHER): Payer: Self-pay | Admitting: SURGICAL ONCOLOGY

## 2017-11-22 ENCOUNTER — Inpatient Hospital Stay
Admission: AD | Admit: 2017-11-22 | Discharge: 2017-11-30 | DRG: 920 | Disposition: A | Discharge status: Other Acute Inpatient Hospital | Payer: Medicare Other | Source: Ambulatory Visit | Attending: SURGICAL ONCOLOGY | Admitting: SURGICAL ONCOLOGY

## 2017-11-22 ENCOUNTER — Inpatient Hospital Stay (HOSPITAL_COMMUNITY): Payer: Medicare Other | Admitting: SURGICAL ONCOLOGY

## 2017-11-22 ENCOUNTER — Ambulatory Visit (HOSPITAL_BASED_OUTPATIENT_CLINIC_OR_DEPARTMENT_OTHER): Payer: Medicare Other | Admitting: SURGICAL ONCOLOGY

## 2017-11-22 VITALS — BP 124/80 | HR 69 | Temp 98.2°F | Ht 61.0 in | Wt 198.9 lb

## 2017-11-22 DIAGNOSIS — Z79899 Other long term (current) drug therapy: Secondary | ICD-10-CM

## 2017-11-22 DIAGNOSIS — C259 Malignant neoplasm of pancreas, unspecified: Secondary | ICD-10-CM

## 2017-11-22 DIAGNOSIS — E119 Type 2 diabetes mellitus without complications: Secondary | ICD-10-CM | POA: Diagnosis present

## 2017-11-22 DIAGNOSIS — S31109A Unspecified open wound of abdominal wall, unspecified quadrant without penetration into peritoneal cavity, initial encounter: Secondary | ICD-10-CM | POA: Diagnosis present

## 2017-11-22 DIAGNOSIS — Z7984 Long term (current) use of oral hypoglycemic drugs: Secondary | ICD-10-CM

## 2017-11-22 DIAGNOSIS — C250 Malignant neoplasm of head of pancreas: Secondary | ICD-10-CM | POA: Diagnosis present

## 2017-11-22 DIAGNOSIS — Z9889 Other specified postprocedural states: Secondary | ICD-10-CM

## 2017-11-22 DIAGNOSIS — E876 Hypokalemia: Secondary | ICD-10-CM

## 2017-11-22 DIAGNOSIS — Z9221 Personal history of antineoplastic chemotherapy: Secondary | ICD-10-CM

## 2017-11-22 DIAGNOSIS — Z8249 Family history of ischemic heart disease and other diseases of the circulatory system: Secondary | ICD-10-CM

## 2017-11-22 DIAGNOSIS — Z7982 Long term (current) use of aspirin: Secondary | ICD-10-CM

## 2017-11-22 DIAGNOSIS — E46 Unspecified protein-calorie malnutrition: Secondary | ICD-10-CM | POA: Diagnosis present

## 2017-11-22 DIAGNOSIS — Z833 Family history of diabetes mellitus: Secondary | ICD-10-CM

## 2017-11-22 DIAGNOSIS — Z923 Personal history of irradiation: Secondary | ICD-10-CM

## 2017-11-22 DIAGNOSIS — Z87891 Personal history of nicotine dependence: Secondary | ICD-10-CM

## 2017-11-22 DIAGNOSIS — J449 Chronic obstructive pulmonary disease, unspecified: Secondary | ICD-10-CM | POA: Diagnosis present

## 2017-11-22 DIAGNOSIS — T8189XA Other complications of procedures, not elsewhere classified, initial encounter: Principal | ICD-10-CM | POA: Diagnosis present

## 2017-11-22 LAB — CBC WITH DIFF
BASOPHIL #: 0.03 x10ˆ3/uL (ref 0.00–0.20)
BASOPHIL %: 0 %
EOSINOPHIL #: 0.1 10*3/uL (ref 0.00–0.50)
EOSINOPHIL %: 1 %
HCT: 29.8 % — ABNORMAL LOW (ref 33.5–45.2)
HGB: 9.8 g/dL — ABNORMAL LOW (ref 11.2–15.2)
LYMPHOCYTE #: 0.29 10*3/uL — ABNORMAL LOW (ref 1.00–4.80)
LYMPHOCYTE %: 3 %
MCH: 28.3 pg (ref 27.4–33.0)
MCHC: 32.8 g/dL (ref 32.5–35.8)
MCV: 86.5 fL (ref 78.0–100.0)
MONOCYTE #: 0.53 x10ˆ3/uL (ref 0.30–1.00)
MONOCYTE %: 6 %
MPV: 8.5 fL (ref 7.5–11.5)
NEUTROPHIL #: 7.95 x10ˆ3/uL — ABNORMAL HIGH (ref 1.50–7.70)
NEUTROPHIL %: 89 %
PLATELETS: 198 x10ˆ3/uL (ref 140–450)
RBC: 3.45 x10ˆ6/uL — ABNORMAL LOW (ref 3.63–4.92)
RDW: 15.6 % — ABNORMAL HIGH (ref 12.0–15.0)
WBC: 8.9 10*3/uL (ref 3.5–11.0)

## 2017-11-22 LAB — BASIC METABOLIC PANEL
ANION GAP: 12 mmol/L (ref 4–13)
BUN/CREA RATIO: 10 (ref 6–22)
BUN: 6 mg/dL — ABNORMAL LOW (ref 8–25)
CALCIUM: 8.4 mg/dL — ABNORMAL LOW (ref 8.5–10.2)
CHLORIDE: 95 mmol/L — ABNORMAL LOW (ref 96–111)
CO2 TOTAL: 23 mmol/L (ref 22–32)
CREATININE: 0.61 mg/dL (ref 0.49–1.10)
ESTIMATED GFR: >59 mL/min/{1.73_m2} (ref >59)
GLUCOSE: 111 mg/dL (ref 65–139)
POTASSIUM: 3.1 mmol/L — ABNORMAL LOW (ref 3.5–5.1)
SODIUM: 130 mmol/L — ABNORMAL LOW (ref 136–145)

## 2017-11-22 LAB — HEPATIC FUNCTION PANEL
ALBUMIN: 1.9 g/dL — ABNORMAL LOW (ref 3.4–4.8)
ALKALINE PHOSPHATASE: 101 U/L (ref <150)
ALT (SGPT): 8 U/L (ref <55)
AST (SGOT): 17 U/L (ref 8–41)
BILIRUBIN DIRECT: 0.2 mg/dL (ref <0.3)
BILIRUBIN TOTAL: 0.3 mg/dL (ref 0.3–1.3)
PROTEIN TOTAL: 5.1 g/dL — ABNORMAL LOW (ref 6.0–8.0)

## 2017-11-22 LAB — PHOSPHORUS: PHOSPHORUS: 3.6 mg/dL (ref 2.3–4.0)

## 2017-11-22 LAB — MAGNESIUM: MAGNESIUM: 1 mg/dL — CL (ref 1.6–2.5)

## 2017-11-22 MED ORDER — CETIRIZINE 10 MG TABLET
10.0000 mg | ORAL_TABLET | Freq: Every day | ORAL | Status: DC
Start: 2017-11-23 — End: 2017-11-30
  Administered 2017-11-23 – 2017-11-30 (×8): 10 mg via ORAL
  Filled 2017-11-22 (×8): qty 1

## 2017-11-22 MED ORDER — SODIUM HYPOCHLORITE 0.0125 % TOPICAL SOLUTION
Freq: Two times a day (BID) | CUTANEOUS | Status: DC
Start: 2017-11-22 — End: 2017-11-23
  Administered 2017-11-22: 0

## 2017-11-22 MED ORDER — OXYCODONE 5 MG TABLET
5.0000 mg | ORAL_TABLET | ORAL | Status: DC | PRN
Start: 2017-11-22 — End: 2017-11-30
  Administered 2017-11-23 – 2017-11-30 (×8): 5 mg via ORAL
  Filled 2017-11-22 (×9): qty 1

## 2017-11-22 MED ORDER — CITALOPRAM 20 MG TABLET
20.0000 mg | ORAL_TABLET | Freq: Every day | ORAL | Status: DC
Start: 2017-11-23 — End: 2017-11-30
  Administered 2017-11-23 – 2017-11-30 (×8): 20 mg via ORAL
  Filled 2017-11-22 (×8): qty 1

## 2017-11-22 MED ORDER — PANTOPRAZOLE 20 MG TABLET,DELAYED RELEASE
20.00 mg | DELAYED_RELEASE_TABLET | Freq: Every morning | ORAL | Status: DC
Start: 2017-11-23 — End: 2017-11-30
  Administered 2017-11-23: 0 mg via ORAL
  Administered 2017-11-24 – 2017-11-25 (×2): 20 mg via ORAL
  Administered 2017-11-25: 0 mg via ORAL
  Administered 2017-11-26 – 2017-11-30 (×5): 20 mg via ORAL
  Filled 2017-11-22 (×8): qty 1

## 2017-11-22 MED ORDER — ONDANSETRON HCL 8 MG TABLET
8.00 mg | ORAL_TABLET | Freq: Three times a day (TID) | ORAL | Status: DC | PRN
Start: 2017-11-22 — End: 2017-11-30
  Filled 2017-11-22: qty 1

## 2017-11-22 MED ORDER — ACETAMINOPHEN 325 MG TABLET
650.0000 mg | ORAL_TABLET | ORAL | Status: DC | PRN
Start: 2017-11-22 — End: 2017-11-23

## 2017-11-22 MED ORDER — MAGNESIUM SULFATE 4 GRAM/100 ML (4 %) IN WATER INTRAVENOUS PIGGYBACK
4.0000 g | INJECTION | Freq: Once | INTRAVENOUS | Status: AC
Start: 2017-11-22 — End: 2017-11-22
  Administered 2017-11-22: 0 g via INTRAVENOUS
  Administered 2017-11-22: 4 g via INTRAVENOUS
  Filled 2017-11-22: qty 100

## 2017-11-22 MED ORDER — PROCHLORPERAZINE MALEATE 5 MG TABLET
10.00 mg | ORAL_TABLET | Freq: Four times a day (QID) | ORAL | Status: DC | PRN
Start: 2017-11-22 — End: 2017-11-30

## 2017-11-22 MED ORDER — MONTELUKAST 10 MG TABLET
10.00 mg | ORAL_TABLET | Freq: Every evening | ORAL | Status: DC
Start: 2017-11-22 — End: 2017-11-30
  Administered 2017-11-22: 0 mg via ORAL
  Administered 2017-11-23 – 2017-11-29 (×7): 10 mg via ORAL
  Filled 2017-11-22 (×9): qty 1

## 2017-11-22 MED ORDER — PROMETHAZINE 25 MG TABLET
25.00 mg | ORAL_TABLET | Freq: Four times a day (QID) | ORAL | Status: DC | PRN
Start: 2017-11-22 — End: 2017-11-30

## 2017-11-22 MED ORDER — LACTATED RINGERS INTRAVENOUS SOLUTION
INTRAVENOUS | Status: DC
Start: 2017-11-22 — End: 2017-11-23

## 2017-11-22 MED ORDER — SODIUM CHLORIDE 0.9 % (FLUSH) INJECTION SYRINGE
2.0000 mL | INJECTION | INTRAMUSCULAR | Status: DC | PRN
Start: 2017-11-22 — End: 2017-11-30

## 2017-11-22 MED ORDER — ATORVASTATIN 10 MG TABLET
10.00 mg | ORAL_TABLET | Freq: Every evening | ORAL | Status: DC
Start: 2017-11-22 — End: 2017-11-30
  Administered 2017-11-22: 0 mg via ORAL
  Administered 2017-11-23 – 2017-11-29 (×7): 10 mg via ORAL
  Filled 2017-11-22 (×9): qty 1

## 2017-11-22 MED ORDER — SODIUM CHLORIDE 0.9 % (FLUSH) INJECTION SYRINGE
2.0000 mL | INJECTION | Freq: Three times a day (TID) | INTRAMUSCULAR | Status: DC
Start: 2017-11-22 — End: 2017-11-30
  Administered 2017-11-22 – 2017-11-23 (×2): 2 mL
  Administered 2017-11-23 (×2): 0 mL
  Administered 2017-11-24 (×2): 2 mL
  Administered 2017-11-24 – 2017-11-25 (×3): 0 mL
  Administered 2017-11-25: 2 mL
  Administered 2017-11-26 – 2017-11-27 (×4): 0 mL
  Administered 2017-11-27: 2 mL
  Administered 2017-11-27: 0 mL
  Administered 2017-11-28: 2 mL
  Administered 2017-11-28: 0 mL
  Administered 2017-11-28: 2 mL
  Administered 2017-11-29 (×2): 0 mL
  Administered 2017-11-29 – 2017-11-30 (×2): 2 mL

## 2017-11-22 MED ORDER — GABAPENTIN 300 MG CAPSULE
300.0000 mg | ORAL_CAPSULE | Freq: Every day | ORAL | Status: DC
Start: 2017-11-23 — End: 2017-11-30
  Administered 2017-11-23 – 2017-11-30 (×9): 300 mg via ORAL
  Filled 2017-11-22 (×8): qty 1

## 2017-11-22 MED ORDER — METOPROLOL SUCCINATE ER 50 MG TABLET,EXTENDED RELEASE 24 HR
50.0000 mg | ORAL_TABLET | Freq: Every day | ORAL | Status: DC
Start: 2017-11-23 — End: 2017-11-30
  Administered 2017-11-23 – 2017-11-30 (×8): 50 mg via ORAL
  Filled 2017-11-22 (×8): qty 1

## 2017-11-22 NOTE — Care Management Notes (Signed)
Referral Information  ++++++ Placed Provider #1 ++++++  Provider Type: DME  Address:  ,    Contact:    Fax:   Fax:

## 2017-11-22 NOTE — Nurses Notes (Signed)
Patient resting in bed with call bell at her side. Alert and orientedx4. High fall precautions maintained. Patient denies chest pain and shortness of breath. Patient currently does not have any medications ordered other than continuous fluids and magnesium sulfate 4 grams IV ordered. Patient has wound care orders for a wet to dry dressing with dakins but dakins has not been ordered yet. Service paged regarding all this information. Dr. Valrie Hart returned paged and told RN she would take care of some of the medications as well as wound care orders. Assessment per flowsheet.

## 2017-11-22 NOTE — Care Management Notes (Signed)
Referral Information  ++++++ Placed Provider #1 ++++++  Case Manager: Jonathan Hoyle  Provider Type: Home Health  Provider Name: [facility] [place] ([id number])  Address:  [address]  [place], [place] [address]  Contact: Trudy Davis    Phone: [phone] x  Fax:   Fax: [phone]

## 2017-11-22 NOTE — H&P (Signed)
[facility]  Surgery  Admission H&P    [place], [name], 71 y.o. female  Date of Birth:  1946-10-01  Encounter Start Date:  11/22/2017  Inpatient Admission Date: 11/22/2017     Information Obtained from: patient and history reviewed via medical record  Chief Complaint: increased pain and drainage from Left groin; increased fatigue    PCP: No Pcp     HPI: (must include no less than 4 of the following main descriptors) Location (of pain): Quality (character of pain) Severity (minimal, mild, severe, scale or 1-10) Duration (how long has pain/sx present) Timing (when does pain/sx occur)  Context (activity at/before onset) Modifying Factors (what makes pain/sx  Better/worse) Associate Sign/Sx (what accompanies main pain/sx)     [name] is a 71 y.o., Unknown female with a h/o pancreatic cancer s/p robotic Whipple surgery on 10/25/17 who presents with increased pain and drainage from Left groin extraction site.  She was discharge on 11/16/17 and has enjoyed staying with her daughter during this time.  She has walked around the house but mostly watched TV.  Endorses persistent soreness, pain and drainage from Left groin wound.  She also endorses not eating as much as she needs to.  She thinks she has gained some weight since discharge, but she thinks it is mainly fluid accumulation in her lower extremities.  She was seen in clinic today by Dr. Cyndi Bender.  Due to the worsening drainage and poor healing of the Left groin wound, she was admitted directly to the Surgical Oncology service.    Refer to 11/22/17 clinic note for additional information.    Admission Source:  Clinic referral    ROS:  MUST comment on all "Abnormal" findings   ROS Other than ROS in the HPI, all other systems were negative.    PAST MEDICAL/ FAMILY/ SOCIAL HISTORY:       Past Medical History:   Diagnosis Date   . Abdominal hernia 10/18/2017    hx of repair   . Anxiety    . Arthritis    . Asthma    . Atrial fibrillation (CMS HCC)    .  Back problem    . Bruises easily    . Cancer (CMS [place]) 10/18/2017    chemo and radiation completed 09/24/2017   . COPD (chronic obstructive pulmonary disease) (CMS HCC)    . CPAP (continuous positive airway pressure) dependence 10/18/2017    has not used recently   . Depression    . DM (diabetes mellitus) (CMS HCC) 2000    Fasting BG 200's. HGA1C 2018 6   . Edema    . Essential hypertension    . GERD (gastroesophageal reflux disease)    . Headache    . Heartburn    . Hx antineoplastic chemo 2018   . Hx of radiation therapy 2018   . Hypercholesteremia    . Hyperlipidemia    . Migraine 10/18/2017    none recently   . Obesity    . Palpitations    . Pancreatic cancer (CMS [place]) 03/2017   . Panic attack    . Peripheral neuropathy 10/18/2017    knees down, bilateral   . Sleep apnea    . Type 2 diabetes mellitus (CMS HCC) 10/18/2017    Dx 2000 FBS 200s   . Unintentional weight loss 10/18/2017    25 lbs 03/2017   . Wears glasses          Allergies   Allergen Reactions   .  Sulfa (Sulfonamides) Itching     Medications Prior to Admission     Prescriptions    aspirin (ECOTRIN) 81 mg Oral Tablet, Delayed Release (E.C.)    Take 81 mg by mouth    atorvastatin (LIPITOR) 10 mg Oral Tablet    Take 10 mg by mouth    cetirizine (ZYRTEC) 10 mg Oral Tablet    Take 10 mg by mouth    citalopram (CELEXA) 20 mg Oral Tablet    Take 20 mg by mouth    furosemide (LASIX) 20 mg Oral Tablet    20 mg Once a day     gabapentin (NEURONTIN) 300 mg Oral Capsule    300 mg Once a day     glipiZIDE (GLUCOTROL XL) 10 mg Oral Tablet Extended Rel 24 hr (2)    Take 10 mg by mouth    hydroCHLOROthiazide (HYDRODIURIL) 25 mg Oral Tablet    25 mg Once a day     HYDROcodone-acetaminophen (NORCO) 5-325 mg Oral Tablet    Take 1 Tab by mouth Every 4 hours as needed for Pain    lidocaine-prilocaine (EMLA) 2.5-2.5 % Cream    LORazepam (ATIVAN) 0.5 mg Oral Tablet    0.5 mg Once a day     losartan (COZAAR) 100 mg Oral Tablet    Take 100 mg by mouth Once a day     MetFORMIN  (GLUCOPHAGE) 1,000 mg Oral Tablet    1,000 mg Twice daily with food     metoprolol succinate (TOPROL-XL) 50 mg Oral Tablet Sustained Release 24 hr    50 mg Once a day     montelukast (SINGULAIR) 10 mg Oral Tablet    Take 10 mg by mouth    omeprazole (PRILOSEC) 20 mg Oral Capsule, Delayed Release(E.C.)    20 mg Once a day     ondansetron (ZOFRAN) 8 mg Oral Tablet    Take 8 mg by mouth Every 8 hours as needed     potassium chloride (K-DUR) 20 mEq Oral Tab Sust.Rel. Particle/Crystal    Take 20 mEq by mouth    prochlorperazine (COMPAZINE) 10 mg Oral Tablet    Take 10 mg by mouth    promethazine (PHENERGAN) 25 mg Oral Tablet    Take 25 mg by mouth         Past Surgical History:   Procedure Laterality Date   . CESAREAN SECTION     . HX HERNIA REPAIR     . HX SUBCLAVIAN PORT IMPLANTION      s/p removed   . HX SUBCLAVIAN PORT IMPLANTION     . HX TONSIL AND ADENOIDECTOMY     . HX TONSILLECTOMY     . UMBILICAL HERNIA REPAIR      x2         Family History:   Family Medical History:     Problem Relation (Age of Onset)    Diabetes Mother    Heart Disease Mother        Social History     Tobacco Use   . Smoking status: Former Smoker     Packs/day: 0.50     Years: 20.00     Pack years: 10.00     Last attempt to quit: 10/18/1993     Years since quitting: 24.1   . Smokeless tobacco: Never Used   Substance Use Topics   . Alcohol use: No   . Drug use: No         PHYSICAL  EXAMINATION: MUST comment on all "Abnormal" findings    Exam    Constitutional: appears chronically ill and no distress  Eyes: Pupils equal and round.   ENT: ENMT without erythema or injection  Neck: no enlargement  Respiratory: normal rate, nonlabored respirations  Cardiovascular: normal rate, distal pulses equal and intact bilaterally  Gastrointestinal: soft, nondistended, moderately TTP LLQ and L groin, not TTP in other quadrants; open LLQ abdominal wound with erythematous edges; draining thin fluid; packed with gauze; dressing wet with drainage  Musculoskeletal:  Head atraumatic and normocephalic  Integumentary:  Skin warm and dry and No rashes  Neurologic: Grossly normal, Alert and oriented x3  Psychiatric: depressed affect      Labs Ordered/ Reviewed (Please indicate ordered or reviewed)   Reviewed: Labs:  Lab Results Today:  No results found for any visits on 11/22/17 (from the past 24 hour(s)).    Ordered: CBC, BMP, Mag, Phos, prealb, alb    Radiology Tests Ordered/ Reviewed (Please indicate ordered or reviewed)   Reviewed:N/A    Ordered: CT A/P w/ PO and IV contrast      ASSESSMENT & PLAN:    There are no active hospital problems to display for this patient.  71 yo F with a h/o pancreatic cancer s/p robotic Whipple surgery on 10/25/17 who presents with pain, erythema and draining around the LLQ wound.  She was seen in clinic today and admitted directly for further management of the LLQ wound and improvement of her nutrition.    - Admit to floor under care of Surg Onc  - Dakins dressing wet-to-dry Left groin wound  - CT A/P w/ PO and IV contrast  - Labs  - DVT PPx  - Restart home meds  - Regular diet    DNR Status this admission:  Full Code      DVT/PE Prophylaxis: Enoxaparin    Juleen China, MD  11/22/2017, 16:49      I saw and examined the patient.  I reviewed the resident's note.  I agree with the findings and plan of care as documented in the resident's note.      71 year old female status post robotic Whipple on October 25, 2017 presenting with pain and erythema and drainage from her left lower quadrant and poor p.o. intake and mobility at home.  Admit for wound care, wound VAC placement,  monitoring of p.o. intake and consideration for supplemental nutrition, and nursing facility placement.      Lynwood Dawley, MD

## 2017-11-22 NOTE — Care Management Notes (Signed)
Referral Information  ++++++ Placed Provider #1 ++++++  Case Manager: Jonathan Hoyle  Provider Type: Nursing Home/SNF  Address:  ,    Contact:    Fax:   Fax:

## 2017-11-22 NOTE — Nurses Notes (Signed)
Patient assessment per flowsheet.  Patient oriented to room.  Patient denies pain, says she feels some tenderness when pressed on abdomen where her whipple was done.  No N/V.  Sitter select on, call light within reach, will continue to monitor.

## 2017-11-22 NOTE — Care Management Notes (Signed)
Referral Information  ++++++ Placed Provider #1 ++++++  Case Manager: Jonathan Hoyle  Provider Type: Home Infusion  Provider Name: [facility] ([place] [place]  Address:  [address]  [place], [place] [address]  Contact: Tanya Sutton    Phone: [phone] x  Fax:   Fax: [phone]

## 2017-11-23 ENCOUNTER — Inpatient Hospital Stay (HOSPITAL_COMMUNITY): Payer: Medicare Other

## 2017-11-23 DIAGNOSIS — K439 Ventral hernia without obstruction or gangrene: Secondary | ICD-10-CM

## 2017-11-23 DIAGNOSIS — R188 Other ascites: Secondary | ICD-10-CM

## 2017-11-23 DIAGNOSIS — K6389 Other specified diseases of intestine: Secondary | ICD-10-CM

## 2017-11-23 DIAGNOSIS — E876 Hypokalemia: Secondary | ICD-10-CM

## 2017-11-23 LAB — BASIC METABOLIC PANEL
ANION GAP: 9 mmol/L (ref 4–13)
ANION GAP: 10 mmol/L (ref 4–13)
BUN/CREA RATIO: 9 (ref 6–22)
BUN/CREA RATIO: 6 (ref 6–22)
BUN: 5 mg/dL — ABNORMAL LOW (ref 8–25)
BUN: 4 mg/dL — ABNORMAL LOW (ref 8–25)
CALCIUM: 8.2 mg/dL — ABNORMAL LOW (ref 8.5–10.2)
CALCIUM: 9.1 mg/dL (ref 8.5–10.2)
CHLORIDE: 98 mmol/L (ref 96–111)
CHLORIDE: 93 mmol/L — ABNORMAL LOW (ref 96–111)
CO2 TOTAL: 27 mmol/L (ref 22–32)
CO2 TOTAL: 26 mmol/L (ref 22–32)
CREATININE: 0.56 mg/dL (ref 0.49–1.10)
CREATININE: 0.63 mg/dL (ref 0.49–1.10)
ESTIMATED GFR: >59 mL/min/1.73mˆ2 (ref >59)
ESTIMATED GFR: >59 mL/min/{1.73_m2} (ref >59)
GLUCOSE: 122 mg/dL (ref 65–139)
GLUCOSE: 230 mg/dL — ABNORMAL HIGH (ref 65–139)
POTASSIUM: 2.8 mmol/L — CL (ref 3.5–5.1)
POTASSIUM: 4 mmol/L (ref 3.5–5.1)
SODIUM: 134 mmol/L — ABNORMAL LOW (ref 136–145)
SODIUM: 129 mmol/L — ABNORMAL LOW (ref 136–145)

## 2017-11-23 LAB — HEPATIC FUNCTION PANEL
ALBUMIN: 1.6 g/dL — ABNORMAL LOW (ref 3.4–4.8)
ALKALINE PHOSPHATASE: 86 U/L (ref <150)
ALT (SGPT): 7 U/L (ref <55)
AST (SGOT): 15 U/L (ref 8–41)
BILIRUBIN DIRECT: 0.2 mg/dL (ref <0.3)
BILIRUBIN TOTAL: 0.3 mg/dL (ref 0.3–1.3)
PROTEIN TOTAL: 4.4 g/dL — ABNORMAL LOW (ref 6.0–8.0)

## 2017-11-23 LAB — CBC
HCT: 26.1 % — ABNORMAL LOW (ref 33.5–45.2)
HGB: 8.5 g/dL — ABNORMAL LOW (ref 11.2–15.2)
MCH: 27.3 pg — ABNORMAL LOW (ref 27.4–33.0)
MCHC: 32.8 g/dL (ref 32.5–35.8)
MCV: 83.5 fL (ref 78.0–100.0)
MPV: 7.9 fL (ref 7.5–11.5)
MPV: 7.9 fL (ref 7.5–11.5)
PLATELETS: 237 x10ˆ3/uL (ref 140–450)
RBC: 3.12 10*6/uL — ABNORMAL LOW (ref 3.63–4.92)
RDW: 15.8 % — ABNORMAL HIGH (ref 12.0–15.0)
WBC: 3.7 x10ˆ3/uL (ref 3.5–11.0)

## 2017-11-23 LAB — MAGNESIUM: MAGNESIUM: 1.8 mg/dL (ref 1.6–2.5)

## 2017-11-23 LAB — SODIUM
SODIUM: 128 mmol/L — ABNORMAL LOW (ref 136–145)
SODIUM: 128 mmol/L — ABNORMAL LOW (ref 136–145)

## 2017-11-23 LAB — PHOSPHORUS: PHOSPHORUS: 4.2 mg/dL — ABNORMAL HIGH (ref 2.3–4.0)

## 2017-11-23 MED ORDER — POTASSIUM CHLORIDE 10 MEQ/100ML IN STERILE WATER INTRAVENOUS PIGGYBACK
10.0000 meq | INJECTION | INTRAVENOUS | Status: AC
Start: 2017-11-23 — End: 2017-11-23
  Administered 2017-11-23: 0 meq via INTRAVENOUS
  Administered 2017-11-23: 10 meq via INTRAVENOUS
  Administered 2017-11-23: 0 meq via INTRAVENOUS
  Administered 2017-11-23: 10 meq via INTRAVENOUS
  Administered 2017-11-23: 0 meq via INTRAVENOUS
  Administered 2017-11-23 (×2): 10 meq via INTRAVENOUS
  Administered 2017-11-23: 0 meq via INTRAVENOUS
  Filled 2017-11-23 (×4): qty 100

## 2017-11-23 MED ORDER — HYDROCODONE 10 MG-ACETAMINOPHEN 325 MG TABLET
1.00 | ORAL_TABLET | ORAL | Status: DC | PRN
Start: 2017-11-23 — End: 2017-11-30
  Administered 2017-11-23 – 2017-11-28 (×12): 1 via ORAL
  Filled 2017-11-23 (×12): qty 1

## 2017-11-23 MED ORDER — HYDROCODONE 5 MG-ACETAMINOPHEN 325 MG TABLET
1.00 | ORAL_TABLET | ORAL | Status: DC | PRN
Start: 2017-11-23 — End: 2017-11-30

## 2017-11-23 MED ORDER — LACTATED RINGERS INTRAVENOUS SOLUTION
INTRAVENOUS | Status: DC
Start: 2017-11-23 — End: 2017-11-24

## 2017-11-23 MED ORDER — IOPAMIDOL 250 MG IODINE/ML (51 %) INTRAVENOUS SOLUTION
100.00 mL | INTRAVENOUS | Status: AC
Start: 2017-11-23 — End: 2017-11-23
  Administered 2017-11-23: 11:00:00 100 mL via INTRAVENOUS

## 2017-11-23 MED ORDER — DIATRIZOATE MEGLUMINE-DIATRIZOATE SODIUM 66 %-10 % ORAL SOLUTION
8.00 mL | ORAL | Status: AC
Start: 2017-11-23 — End: 2017-11-23
  Administered 2017-11-23: 8 mL via ORAL

## 2017-11-23 MED ORDER — POTASSIUM CHLORIDE ER 20 MEQ TABLET,EXTENDED RELEASE(PART/CRYST)
20.0000 meq | ORAL_TABLET | Freq: Three times a day (TID) | ORAL | Status: AC
Start: 2017-11-23 — End: 2017-11-24
  Administered 2017-11-23 – 2017-11-24 (×3): 20 meq via ORAL
  Filled 2017-11-23 (×4): qty 1

## 2017-11-23 MED ORDER — SODIUM HYPOCHLORITE 0.125 % SOLUTION
Freq: Two times a day (BID) | Status: DC
Start: 2017-11-23 — End: 2017-11-24
  Filled 2017-11-23: qty 473

## 2017-11-23 MED ORDER — ENOXAPARIN 40 MG/0.4 ML SUBCUTANEOUS SYRINGE
40.0000 mg | INJECTION | SUBCUTANEOUS | Status: DC
Start: 2017-11-23 — End: 2017-11-30
  Administered 2017-11-23 – 2017-11-30 (×8): 40 mg via SUBCUTANEOUS
  Filled 2017-11-23 (×9): qty 0.4

## 2017-11-23 MED ADMIN — lactated Ringers intravenous solution: ORAL | @ 07:00:00 | NDC 00338011704

## 2017-11-23 NOTE — Nurses Notes (Signed)
MD notified about potential for increasing the Dakins concentration. Per MD he will change order for dressing changes. Will continue to monitor.

## 2017-11-23 NOTE — Progress Notes (Signed)
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  Given her treatment response she was taken to the operating room on 10/25/2017 for robotic Whipple procedure.   Her postoperative course was complicated by a chyle leak and poor PO intake, which resolved with bowel rest, TPN, and drainage.   Her wound also was opened and packed at the bedside  but she was not given antibiotics. She was cleared by Physical therapy for discharge home in the care of her sister and family.  She was eating most of her meals at discharge. Her drain was removed prior to discharge    SUBJECTIVE:  At this time she states she is not doing very well.   She has been unable to eat more than a few bites because of a poor appetite.  She denies any nausea, vomiting or severe abdominal pain related to eating.  She is walking back and forth to the bathroom but otherwise has minimal mobility at home. Her and her family have been caring for the wound but it is draining a lot requiring 2-3 dressing changes per day and this is irritating her skin.   She denies fever, chills or other complaints.    OBJECTIVE:  AF  VSS  A&O, NAD  RRR  No respiratory distress  Abdoment soft, nondistended, tender around LLQ incision.  Extraction site erythematous, foul smelling with fibrinous exudate.  Wound appearance improved slightly since discharge but more tender.  Bilateral lower extremity edema      Pathology  Final Pathologic Diagnosis    A. GALLBLADDER, CHOLECYSTECTOMY:    - Cholesterolosis.    - Cholelithiasis.    - No malignancy identified.    B. PORTION OF PANCREAS, DUODENUM, AND DISTAL STOMACH, PANCREATICODUODENECTOMY:    - Well-differentiated ductal adenocarcinoma, involving the pancreatic head  (see Cancer Case Summary).    - Seven lymph nodes, negative for malignancy (0/7).    -  Pancreatic intraepithelial neoplasia (PanIN 3).    - Chronic pancreatitis.    - Stomach and duodenum with no evidence of malignancy.    C. FINAL BILE DUCT MARGIN, EXCISION:    - No malignancy identified.    CANCER CASE SUMMARY - CARCINOMA OF THE PANCREAS  Procedure: Pancreaticoduodenectomy, cholecystectomy, and additional bile duct  margin excision.  Tumor site: Pancreatic head.  Tumor size: 5.0 x 4.2 x 2.3 cm.   Histologic type: Ductal adenocarcinoma.  Histologic grade: G1 - well-differentiated.  Tumor extension: Tumor is confirmed to pancreas.   Margins: All margins are uninvolved by invasive carcinoma and high-grade  intraepithelial neoplasia.    Pancreatic neck margin: Uninvolved.    Uncinate (retroperitoneal/superior mesenteric artery) margin: Uninvolved  (2 mm from the closest margin, slide   B10).    Bile duct margin: Uninvolved.    Proximal margin: Uninvolved.    Distal margin: Uninvolved.    Vascular groove: Uninvolved.  Treatment effect: Absent.    Extensive residual cancer with no evident tumor regression (poor or no  response, score 3).  Lymphovascular invasion: Not identified.  Perineural invasion: Present (slide B9).  Regional lymph nodes:     Number of lymph nodes involved: 0.    Number of lymph nodes examined: 7.  Pathologic stage classification: ypT3, pN0.  Additional pathologic findings: Pancreatic intraepithelial neoplasia (PanIN 3),  chronic pancreatitis.       Comment  Selected slides from part B  were reviewed with Dr. Lance Morin with concurrence.         lctr - 10/30/2017 Electronically Signed By: Les Pou, M.D.  10/30/2017 11:08     ASSESSMENT/PLAN:   71 year old female status post robotic assisted Whipple for pancreatic adenocarcinoma.  Although her wound is slightly improved in appearance, it is more tender and she is having significant drainage which is making it difficult to care for at home.  Additionally  she is largely immobile and has poor p.o. intake and malnutrition.  We will admitted to the hospital for wound care, placement of a wound VAC, and  assessment of her nutritional status.  We also work on placement to a nursing facility to facilitate her recovery.   All of her and her family's questions were answered and they agreed with the plan.     Lynwood Dawley, MD  11/23/2017, 08:42

## 2017-11-23 NOTE — Care Plan (Signed)
[facility]  Medical Nutrition Therapy Screen Note                                      Date of Service: 11/23/2017    Reason for Note: Nursing Nutritional Risk notification:  weight loss in the last 3 months or less    Reviewed patient status, diet order/TF/TPN, labs and medications.  Height: 154.9 cm ('5\' 1"' )   Weight: 90.2 kg (198 lb 13.7 oz) (11/22/17 1607)   Body mass index is 37.57 kg/m.    Brief Subjective:   Met with pt at bedside. Pt endorses mild abdominal pain without N/V and states she had a BM yesterday 12/12. Pt states she has been eating at home with her daughter but "maybe not enough". Encouraged the pt to consume low fat, high protein foods to promote wound healing. Pt's weight has been stable since last discharge on 11/15/17 and has been eating 50-75% of meals at home. UBW of 185 lbs, currently 198 lbs with +2 edema. Pt was advanced to a Regular diet with Ensure High Protein TID and was ordering breakfast when I left the room.      Most Recent Screen   Impaired Nutrition Status Score Impaired Nutrition Status Score: 1 - Food intake below 50 - 75% of normal requirement in preceding week - Mild (11/23/17 0900)     Severity of Disease Score Severity of Disease Score: 1 - Chronic patients in particular with acute complications: cirrhosis, COPD, dialysis, oncology, recent bariatric surgery - Mild (11/23/17 0900)     Age Age: 98 - < 70 years (11/23/17 0900)   Score Score: 2 (11/23/17 0900)       Risk Level Risk Level: Level 2 - 2 pts - No initial note required. Follow-up within 5 days (11/23/17 0900)           Nutrition related problems: open abdominal wound needing calorie and protein dense foods to promote wound healing    Assessment: No assessment at this time    Monitor: Po status, Tolerance of diet, Upon physician consult and Patient did not meet criteria for consult/high risk notification    Plan/Intervention:   - Continue Regular diet - may need to  switch to Diabetic due to DM Type II    - Monitor BG - consistently above 100  - Monitor and encourage PO intake  - Encourage Ensure High Protein supplements -TID  - Monitor Phos levels - 4.2  - Continue anti nausea meds PRN  - Weigh pt daily - standing preferred   Will continue to follow     Tees Toh Intern 11/23/2017, 09:39        Ongoing (see interventions/notes)  Adult Inpatient Plan of Care  Plan of Care Review  11/23/2017 0932 - Ongoing (see interventions/notes) by Darcus Pester  Skin Injury Risk Increased  Skin Health and Integrity  11/23/2017 0932 - Ongoing (see interventions/notes) by Darcus Pester

## 2017-11-23 NOTE — Care Plan (Signed)
Discharge Plan:  Swing Bed-Hospital Based (code 12)  [place] into see patient and completed initial assessment at bedside. Patient lives in a house with her sister. There is a ramp to enter the house. She had home health with [facility]. She signed choice for [facility] swing bed and it is placed on the patient chart. She has a FWW at home. She has prescription coverage and manages her own medications. Declined laycaregiver to bedside RN. Will continue to follow.     The patient will continue to be evaluated for developing discharge needs.     Case Manager: Lieutenant Diego, RN  Phone: [phone]

## 2017-11-23 NOTE — Nurses Notes (Signed)
[name] from clinical labs called and informed RN that patients potassium level is 2.8. Dr. Valrie Hart with Surgical Oncology notified.

## 2017-11-23 NOTE — Nurses Notes (Signed)
Patient resting in bed with call bell and personal items at her side. Alert and orientedx4. High fall precautions maintained. Wound care performed using sterile technique to left lower abdominal wound. Patient reminded to call before getting out of bed and if she needs anything. Assessment per flowsheet.

## 2017-11-23 NOTE — Care Management Notes (Signed)
[facility] Management Initial Evaluation    Patient Name: [name]  Date of Birth: 29-Aug-1946  Sex: female  Date/Time of Admission: 11/22/2017  3:23 PM  Room/Bed: 931/B  Payor: MEDICARE / Plan: MEDICARE PART A AND B / Product Type: Medicare /   PCP: No Pcp    Pharmacy Info:   Preferred Pharmacy     [facility] [phone] - [place], [address] [place] [address]    Phone: [phone] Fax: [phone]    Not a 24 hour pharmacy; exact hours not known        Emergency Contact Info:   Extended Emergency Contact Information  Primary Emergency Contact: [name]  Address: [address]   [place] Phone: [phone]  Relation: Son    History:   [name] is a 71 y.o., female, admitted Open abdominal wall wounf     Height/Weight: 154.9 cm (5\' 1" ) / 90.2 kg (198 lb 13.7 oz)     LOS: 1 day   Admitting Diagnosis: Open abdominal wall wound [S31.109A]    Assessment:      11/23/17 1358   Assessment Details   Assessment Type Admission   Date of Care Management Update 11/23/17   Date of Next DCP Update 11/24/17   Readmission   Is this a readmission? Yes   Care Management Plan   Discharge Planning Status initial meeting   Projected Discharge Date 11/27/17   CM will evaluate for rehabilitation potential yes   Patient choice offered to patient/family yes   Form for patient choice reviewed/signed and on chart yes   Facility or [facility] swing bed    Patient aware of possible cost for ambulance transport?  Yes   Discharge Needs Assessment   Equipment Currently Used at Home walker, standard   Equipment Needed After Discharge none   ADVANCE DIRECTIVES   Does the Patient have an Advance Directive? Yes, Patient Does Have Advance Directive for Healthcare Treatment   Type of Advance Directive Completed Medical Power of [name];Advanced Psychiatric Directives   Copy of Advance Directives in Chart? 2    Patient Requests Assistance in Having Advance Directive Notarized. N/A   LAY CAREGIVER    Appointed Lay Caregiver? I Decline   Employment/Financial   Patient has Prescription Coverage?  Yes   Financial Concerns none   Living Environment   Select an age group to open "lives with" row.  Adult   Lives With sibling(s)   Living Arrangements house   Able to Return to Prior Arrangements yes   Home Safety   Home Assessment: No Problems Identified   Home Accessibility ramps present at home   Legal Issues   Do you have a court appointed guardian/conservator? No     71 yo female with PHM of pancreatic cancer. Recently discharged. Will likely dc to [facility]. Being worked up currently to determine needs.      Discharge Plan:  Swing Bed-Hospital Based (code 68)  [place] into see patient and completed initial assessment at bedside. Patient lives in a house with her sister. There is a ramp to enter the house. She had home health with [facility]. She signed choice for [facility] and it is placed on the patient chart. She has a FWW at home. She has prescription coverage and manages her own medications. Declined laycaregiver to bedside RN. Will  continue to follow.     The patient will continue to be evaluated for developing discharge needs.     Case Manager: Lieutenant Diego, RN  Phone: [phone]

## 2017-11-23 NOTE — Ancillary Notes (Signed)
Diabetes Education    Stopped to offer diabetes education. The patient states that she has had diabetes for about 18 years. The patient states that she takes Metformin. The patient verbalized understanding and compliance with medication regimen. The patient stated that she does not test her blood sugar but would like a new glucometer. Will pend orders for a new glucometer/testing supplies. Need signed by MD and sent to preferred pharmacy. Current A1c   Lab Results   Component Value Date    HA1C 7.4 (H) 10/29/2017     Reviewed this value and meaning with patient. Discussed ADA targets. The patient denies issues with obtaining testing supplies or medications. We reviewed hypoglycemia. We reviewed progression of disease, ADA BG targets and the importance of testing and avoidance of complications by managing blood sugars. The patient shared that she follows with her primary care physician for diabetes. Answered all questions. Verbalized understanding. Contact information offered.    Hillery Hunter BSN, Therapist, sports.   [facility]  Ext: [phone]  Pager: [phone]

## 2017-11-23 NOTE — Nurses Notes (Signed)
Patient has had 4 loose BMs throughout the shift and MD notified that patient is requesting Norco for pain control. C diff order placed. Will continue to monitor.

## 2017-11-23 NOTE — Progress Notes (Signed)
[facility]  Surgery Progress Note    [place], [name], 71 y.o. female  Date of Birth:  1946/10/04  Date of Admission:  11/22/2017  Date of service: 11/23/2017    Post Op Day:   S/P   Chief Complaint: increased pain and drainage from Left groin; increased fatigue  Subjective: NAEO. Pt continues to c/o mild pain in the LLQ. WTD dressings. CT not performed yet.    Vital Signs:  Temp (24hrs) Max:37.3 C (56.8 F)      Systolic (12XNT), ZGY:174 , Min:149 , BSW:967     Diastolic (59FMB), WGY:65, Min:70, Max:77    Temp  Avg: 36.9 C (98.4 F)  Min: 36.6 C (97.8 F)  Max: 37.3 C (99.1 F)  Pulse  Avg: 86  Min: 84  Max: 87  Resp  Avg: 17.3  Min: 16  Max: 18  SpO2  Avg: 93 %  Min: 91 %  Max: 96 %  Pain Score (Numeric, Faces): 6    Today's Physical Exam:  Temperature: 36.8 C (98.2 F)  Heart Rate: 84  BP (Non-Invasive): (!) 149/70(rn notified)  Respiratory Rate: 16  SpO2-1: 96 %  Pain Score (Numeric, Faces): 6  General:  NAD.  Eyes:  Conjunctiva clear  HENT:   Mucous membranes moist  Lungs: Nonlabored breathing, Normal respiratory effort  Cardiovascular:  Regular rate and rhythm  Abdomen:  Soft, nontender, nondistended, moderately TTP LLQ and L groin, not TTP in other quadrants; open LLQ abdominal wound with erythematous edges; draining thin fluid; packed with gauze; dressing wet with drainage  Extremities: No cyanosis or edema.  Skin:  Skin warm and dry.  Neurologic:  Alert and oriented x3, grossly normal  Psychiatric:  Affect normal.    Current Medications:    Current Facility-Administered Medications:  acetaminophen (TYLENOL) tablet 650 mg Oral Q4H PRN   atorvastatin (LIPITOR) tablet 10 mg Oral QPM   cetirizine (ZYRTEC) tablet 10 mg Oral Daily   citalopram (CELEXA) tablet 20 mg Oral Daily   enoxaparin PF (LOVENOX) 40 mg/0.4 mL SubQ injection 40 mg Subcutaneous Q24H   gabapentin (NEURONTIN) capsule 300 mg Oral Daily   LR premix infusion  Intravenous Continuous   metoprolol succinate (TOPROL-XL) 24 hr extended  release tablet 50 mg Oral Daily   montelukast (SINGULAIR) 10 mg tablet 10 mg Oral QPM   NS flush syringe 2 mL Intracatheter Q8HRS   And      NS flush syringe 2-6 mL Intracatheter Q1 MIN PRN   ondansetron (ZOFRAN) tablet 8 mg Oral Q8H PRN   oxyCODONE (ROXICODONE) immediate release tablet 5 mg Oral Q4H PRN   pantoprazole (PROTONIX) delayed release tablet 20 mg Oral Daily before Breakfast   prochlorperazine (COMPAZINE) tablet 10 mg Oral Q6H PRN   promethazine (PHENERGAN) tablet 25 mg Oral Q6H PRN   sodium hypochlorite 0.0125% (WOUNDCLENZ OTC) irrigation  Irrigation Q12H       I/O:  I/O last 24 hours:      Intake/Output Summary (Last 24 hours) at 11/23/2017 0812  Last data filed at 11/23/2017 0742  Gross per 24 hour   Intake 750 ml   Output --   Net 750 ml     I/O current shift:  No intake/output data recorded.    Prophylaxis:  Date Started Date Completed   DVT/PE  Enoxaparin and SCDs/ Venodynes/Impulse boots     GI: Proton Pump inhibitor       Nutrition/Residuals:  DIET NPO - NOW    Labs  (Please indicate  ordered or reviewed)  Reviewed: Lab Results for Last 24 Hours:  Results for orders placed or performed during the hospital encounter of 11/22/17 (from the past 24 hour(s))   BASIC METABOLIC PANEL   Result Value Ref Range    SODIUM 130 (L) 136 - 145 mmol/L    POTASSIUM 3.1 (L) 3.5 - 5.1 mmol/L    CHLORIDE 95 (L) 96 - 111 mmol/L    CO2 TOTAL 23 22 - 32 mmol/L    ANION GAP 12 4 - 13 mmol/L    CALCIUM 8.4 (L) 8.5 - 10.2 mg/dL    GLUCOSE 111 65 - 139 mg/dL    BUN 6 (L) 8 - 25 mg/dL    CREATININE 0.61 0.49 - 1.10 mg/dL    BUN/CREA RATIO 10 6 - 22    ESTIMATED GFR >59 >59 mL/min/1.22m2   MAGNESIUM   Result Value Ref Range    MAGNESIUM 1.0 (LL) 1.6 - 2.5 mg/dL   PHOSPHORUS   Result Value Ref Range    PHOSPHORUS 3.6 2.3 - 4.0 mg/dL   HEPATIC FUNCTION PANEL   Result Value Ref Range    ALBUMIN 1.9 (L) 3.4 - 4.8 g/dL    ALKALINE PHOSPHATASE 101 <150 U/L    ALT (SGPT) 8 <55 U/L    AST (SGOT) 17 8 - 41 U/L    BILIRUBIN TOTAL 0.3  0.3 - 1.3 mg/dL    BILIRUBIN DIRECT 0.2 <0.3 mg/dL    PROTEIN TOTAL 5.1 (L) 6.0 - 8.0 g/dL   CBC WITH DIFF   Result Value Ref Range    WBC 8.9 3.5 - 11.0 x103/uL    RBC 3.45 (L) 3.63 - 4.92 x106/uL    HGB 9.8 (L) 11.2 - 15.2 g/dL    HCT 29.8 (L) 33.5 - 45.2 %    MCV 86.5 78.0 - 100.0 fL    MCH 28.3 27.4 - 33.0 pg    MCHC 32.8 32.5 - 35.8 g/dL    RDW 15.6 (H) 12.0 - 15.0 %    PLATELETS 198 140 - 450 x103/uL    MPV 8.5 7.5 - 11.5 fL    NEUTROPHIL % 89 %    LYMPHOCYTE % 3 %    MONOCYTE % 6 %    EOSINOPHIL % 1 %    BASOPHIL % 0 %    NEUTROPHIL # 7.95 (H) 1.50 - 7.70 x103/uL    LYMPHOCYTE # 0.29 (L) 1.00 - 4.80 x103/uL    MONOCYTE # 0.53 0.30 - 1.00 x103/uL    EOSINOPHIL # 0.10 0.00 - 0.50 x103/uL    BASOPHIL # 0.03 0.00 - 0.20 V409/WJ   BASIC METABOLIC PANEL   Result Value Ref Range    SODIUM 134 (L) 136 - 145 mmol/L    POTASSIUM 2.8 (LL) 3.5 - 5.1 mmol/L    CHLORIDE 98 96 - 111 mmol/L    CO2 TOTAL 27 22 - 32 mmol/L    ANION GAP 9 4 - 13 mmol/L    CALCIUM 8.2 (L) 8.5 - 10.2 mg/dL    GLUCOSE 122 65 - 139 mg/dL    BUN 5 (L) 8 - 25 mg/dL    CREATININE 0.56 0.49 - 1.10 mg/dL    BUN/CREA RATIO 9 6 - 22    ESTIMATED GFR >59 >59 mL/min/1.71m2   CBC   Result Value Ref Range    WBC 3.7 3.5 - 11.0 x103/uL    RBC 3.12 (L) 3.63 - 4.92 x106/uL    HGB 8.5 (L)  11.2 - 15.2 g/dL    HCT 26.1 (L) 33.5 - 45.2 %    MCV 83.5 78.0 - 100.0 fL    MCH 27.3 (L) 27.4 - 33.0 pg    MCHC 32.8 32.5 - 35.8 g/dL    RDW 15.8 (H) 12.0 - 15.0 %    PLATELETS 237 140 - 450 x103/uL    MPV 7.9 7.5 - 11.5 fL   MAGNESIUM   Result Value Ref Range    MAGNESIUM 1.8 1.6 - 2.5 mg/dL   PHOSPHORUS   Result Value Ref Range    PHOSPHORUS 4.2 (H) 2.3 - 4.0 mg/dL   HEPATIC FUNCTION PANEL   Result Value Ref Range    ALBUMIN 1.6 (L) 3.4 - 4.8 g/dL    ALKALINE PHOSPHATASE 86 <150 U/L    ALT (SGPT) 7 <55 U/L    AST (SGOT) 15 8 - 41 U/L    BILIRUBIN TOTAL 0.3 0.3 - 1.3 mg/dL    BILIRUBIN DIRECT 0.2 <0.3 mg/dL    PROTEIN TOTAL 4.4 (L) 6.0 - 8.0 g/dL         Radiology  Tests (Please indicate ordered or reviewed)  Reviewed:  No results found in last 30 days.      Assessment/ Plan:   Active Hospital Problems   (*Primary Problem)    Diagnosis    Open abdominal wall wound       [name] is a 71 y.o. female status post robotic Whipple on October 25, 2017 presenting with pain and erythema and drainage from her left lower quadrant and poor p.o. intake and mobility at home.  Plan:   - Ok for diet and supplements  - CT abd/pelvis ordered to evaluate wound  - Continue WTD dressing for now  - Potassium replaced  - Pain control  - Continue floor status    Willa Rough, DO PGY-2 11/23/17 08:12   [facility] Department of Surgery      I saw and examined the patient.  I reviewed the resident's note.  I agree with the findings and plan of care as documented in the resident's note.     CT AP reviewed. No acute findings.  Cont dakins.  Place wound vac tomorrow.  Eating fairly well. Check nutrition labs.    Lynwood Dawley, MD

## 2017-11-23 NOTE — Nurses Notes (Signed)
Patient assessment and VS per flow sheet. Patient denies any n/v/d at this time. Patient pain assessed 4/10 to LLQ and denies need for further intervention at this time. Patient labs monitored and replaced per orders. Will continue to monitor.

## 2017-11-24 LAB — PHOSPHORUS: PHOSPHORUS: 2.3 mg/dL (ref 2.3–4.0)

## 2017-11-24 LAB — CBC
HCT: 23.8 % — ABNORMAL LOW (ref 33.5–45.2)
HGB: 8 g/dL — ABNORMAL LOW (ref 11.2–15.2)
MCH: 28.1 pg (ref 27.4–33.0)
MCHC: 33.5 g/dL (ref 32.5–35.8)
MCV: 84 fL (ref 78.0–100.0)
MPV: 8.6 fL (ref 7.5–11.5)
PLATELETS: 242 x10ˆ3/uL (ref 140–450)
RBC: 2.83 x10ˆ6/uL — ABNORMAL LOW (ref 3.63–4.92)
RDW: 15.1 % — ABNORMAL HIGH (ref 12.0–15.0)
WBC: 4.7 x10ˆ3/uL (ref 3.5–11.0)

## 2017-11-24 LAB — BASIC METABOLIC PANEL
ANION GAP: 4 mmol/L (ref 4–13)
BUN/CREA RATIO: 9 (ref 6–22)
BUN: 5 mg/dL — ABNORMAL LOW (ref 8–25)
CALCIUM: 7.6 mg/dL — ABNORMAL LOW (ref 8.5–10.2)
CHLORIDE: 96 mmol/L (ref 96–111)
CO2 TOTAL: 26 mmol/L (ref 22–32)
CREATININE: 0.56 mg/dL (ref 0.49–1.10)
ESTIMATED GFR: >59 mL/min/1.73mˆ2 (ref >59)
GLUCOSE: 214 mg/dL — ABNORMAL HIGH (ref 65–139)
POTASSIUM: 3.8 mmol/L (ref 3.5–5.1)
SODIUM: 126 mmol/L — ABNORMAL LOW (ref 136–145)

## 2017-11-24 LAB — HEPATIC FUNCTION PANEL
ALBUMIN: 1.5 g/dL — ABNORMAL LOW (ref 3.4–4.8)
ALKALINE PHOSPHATASE: 76 U/L (ref <150)
ALT (SGPT): 7 U/L (ref <55)
AST (SGOT): 16 U/L (ref 8–41)
BILIRUBIN DIRECT: 0.1 mg/dL (ref <0.3)
BILIRUBIN TOTAL: <0.2 mg/dL — ABNORMAL LOW (ref 0.3–1.3)
PROTEIN TOTAL: 3.9 g/dL — ABNORMAL LOW (ref 6.0–8.0)

## 2017-11-24 LAB — MAGNESIUM: MAGNESIUM: 1.2 mg/dL — ABNORMAL LOW (ref 1.6–2.5)

## 2017-11-24 LAB — CLOSTRIDIUM DIFFICILE TOXIN DETECTION: CLOSTRIDIUM DIFFICILE TOXIN DETECTION: NEGATIVE

## 2017-11-24 MED ORDER — MAGNESIUM OXIDE 400 MG (241.3 MG MAGNESIUM) TABLET
400.0000 mg | ORAL_TABLET | Freq: Two times a day (BID) | ORAL | Status: AC
Start: 2017-11-24 — End: 2017-11-24
  Administered 2017-11-24 (×2): 400 mg via ORAL
  Filled 2017-11-24 (×2): qty 1

## 2017-11-24 MED ORDER — SODIUM HYPOCHLORITE SOLN 0.25% (HALF STRENGTH) FOR WOUND VAC
Status: DC
Start: 2017-11-24 — End: 2017-11-24
  Filled 2017-11-24: qty 473

## 2017-11-24 MED ORDER — SODIUM CHLORIDE 0.9 % INTRAVENOUS SOLUTION
INTRAVENOUS | Status: DC
Start: 2017-11-24 — End: 2017-11-27

## 2017-11-24 MED ORDER — SODIUM HYPOCHLORITE SOLN 0.125% (QUARTER STRENGTH) FOR WOUND VAC
Status: DC
Start: 2017-11-24 — End: 2017-11-27
  Filled 2017-11-24 (×2): qty 473

## 2017-11-24 MED ADMIN — sodium chloride 0.9 % intravenous solution: INTRAVENOUS | @ 18:00:00 | NDC 00338004904

## 2017-11-24 MED ADMIN — oxyCODONE-acetaminophen 5 mg-325 mg tablet: ORAL | @ 16:00:00

## 2017-11-24 MED ADMIN — enoxaparin 40 mg/0.4 mL subcutaneous syringe: SUBCUTANEOUS | @ 09:00:00

## 2017-11-24 MED ADMIN — DETOX INITIAL: ORAL | @ 16:00:00 | NDC 00338004904

## 2017-11-24 MED ADMIN — lactated Ringers intravenous solution: @ 12:00:00 | NDC 00338011704

## 2017-11-24 MED ADMIN — sodium chloride 0.9 % (flush) injection syringe: ORAL | @ 06:00:00

## 2017-11-24 NOTE — Care Management Notes (Signed)
[facility] Management Note    Patient Name: [name]  Date of Birth: 24-May-1946  Sex: female  Date/Time of Admission: 11/22/2017  3:23 PM  Room/Bed: 931/B  Payor: MEDICARE / Plan: MEDICARE PART A AND B / Product Type: Medicare /    LOS: 2 days   PCP: No Pcp    Admitting Diagnosis:  Open abdominal wall wound [S31.109A]    Assessment:      11/24/17 1616   Assessment Details   Assessment Type Admission   Date of Care Management Update 11/24/17   Date of Next DCP Update 11/27/17   Care Management Plan   Discharge Planning Status plan in progress   Projected Discharge Date 11/27/17   CM will evaluate for rehabilitation potential yes   Discharge Needs Assessment   Discharge Facility/Level of Care Needs SNF Placement (Medicare certified)(code 3)   Transportation Available ambulance       Discharge Plan:  Swing Bed-Hospital Based (code 74)  Per TBR patient is likely going to dc Monday. She will go to [facility] bed unit. Patient will be on a wound vac. Will continue to follow.     The patient will continue to be evaluated for developing discharge needs.     Case Manager: Lieutenant Diego, RN  Phone: [phone]

## 2017-11-24 NOTE — Progress Notes (Signed)
[name]  Surgery Progress Note    [name], [name], 71 y.o. female  Date of Birth:  12-01-1946  Date of Admission:  11/22/2017  Date of service: 11/24/2017    Post Op Day:   S/P   Chief Complaint: increased pain and drainage from Left groin; increased fatigue  Subjective: NAEO. Pt continues to c/o mild pain in the LLQ. WTD dressings. CT not performed yet.    Vital Signs:  Temp (24hrs) Max:36.9 C (37.3 F)      Systolic (42AJG), OTL:572 , Min:127 , IOM:355     Diastolic (97CBU), LAG:53, Min:65, Max:79    Temp  Avg: 36.7 C (98 F)  Min: 36.5 C (97.7 F)  Max: 36.9 C (98.4 F)  Pulse  Avg: 79.7  Min: 76  Max: 85  Resp  Avg: 18  Min: 18  Max: 18  SpO2  Avg: 96.3 %  Min: 96 %  Max: 97 %  Pain Score (Numeric, Faces): 10    Today's Physical Exam:  Temperature: 36.9 C (98.4 F)  Heart Rate: 78  BP (Non-Invasive): 130/65  Respiratory Rate: 18  SpO2-1: 97 %  Pain Score (Numeric, Faces): 10  General:  NAD.  Eyes:  Conjunctiva clear  HENT:   Mucous membranes moist  Lungs: Nonlabored breathing, Normal respiratory effort  Cardiovascular:  Regular rate and rhythm  Abdomen:  Soft, nontender, nondistended, moderately TTP LLQ and L groin, not TTP in other quadrants; open LLQ abdominal wound with erythematous edges; draining thin fluid; packed with gauze; dressing wet with drainage  Extremities: No cyanosis or edema.  Skin:  Skin warm and dry.  Neurologic:  Alert and oriented x3, grossly normal  Psychiatric:  Affect normal.    Current Medications:    Current Facility-Administered Medications:  atorvastatin (LIPITOR) tablet 10 mg Oral QPM   cetirizine (ZYRTEC) tablet 10 mg Oral Daily   citalopram (CELEXA) tablet 20 mg Oral Daily   enoxaparin PF (LOVENOX) 40 mg/0.4 mL SubQ injection 40 mg Subcutaneous Q24H   gabapentin (NEURONTIN) capsule 300 mg Oral Daily   HYDROcodone-acetaminophen (NORCO) 10-325 mg per tablet 1 Tab Oral Q4H PRN   HYDROcodone-acetaminophen (NORCO) 5-325 mg per tablet 1 Tab Oral Q4H PRN   LR premix  infusion  Intravenous Continuous   magnesium oxide (MAG-OX) tablet 400 mg Oral 2x/day   metoprolol succinate (TOPROL-XL) 24 hr extended release tablet 50 mg Oral Daily   montelukast (SINGULAIR) 10 mg tablet 10 mg Oral QPM   NS flush syringe 2 mL Intracatheter Q8HRS   And      NS flush syringe 2-6 mL Intracatheter Q1 MIN PRN   ondansetron (ZOFRAN) tablet 8 mg Oral Q8H PRN   oxyCODONE (ROXICODONE) immediate release tablet 5 mg Oral Q4H PRN   pantoprazole (PROTONIX) delayed release tablet 20 mg Oral Daily before Breakfast   prochlorperazine (COMPAZINE) tablet 10 mg Oral Q6H PRN   promethazine (PHENERGAN) tablet 25 mg Oral Q6H PRN   sodium hypochlorite (DAKINS) 0.125% (quarter strength) irrigation for wound vac  Irrigation Continuous       I/O:  I/O last 24 hours:      Intake/Output Summary (Last 24 hours) at 11/24/2017 1731  Last data filed at 11/24/2017 1700  Gross per 24 hour   Intake 3090 ml   Output 225 ml   Net 2865 ml     I/O current shift:  12/14 0700 - 12/14 1859  In: 1060 [P.O.:660; I.V.:400]  Out: 200 [Urine:200]    Prophylaxis:  Date  Started Date Completed   DVT/PE  Enoxaparin and SCDs/ Venodynes/Impulse boots     GI: Proton Pump inhibitor       Nutrition/Residuals:  DIET REGULAR  DIETARY ORAL SUPPLEMENTS Oral Supplements with tray: Ensure High Protein-Chocolate; BREAKFAST/LUNCH/DINNER; 1 Can  ROOM SERVICE:  NEEDS VISIT FOR MENU CHOICES    Labs  (Please indicate ordered or reviewed)  Reviewed: Lab Results for Last 24 Hours:    Results for orders placed or performed during the hospital encounter of 11/22/17 (from the past 24 hour(s))   SODIUM   Result Value Ref Range    SODIUM 128 (L) 136 - 145 mmol/L   BASIC METABOLIC PANEL   Result Value Ref Range    SODIUM 126 (L) 136 - 145 mmol/L    POTASSIUM 3.8 3.5 - 5.1 mmol/L    CHLORIDE 96 96 - 111 mmol/L    CO2 TOTAL 26 22 - 32 mmol/L    ANION GAP 4 4 - 13 mmol/L    CALCIUM 7.6 (L) 8.5 - 10.2 mg/dL    GLUCOSE 214 (H) 65 - 139 mg/dL    BUN 5 (L) 8 - 25 mg/dL     CREATININE 0.56 0.49 - 1.10 mg/dL    BUN/CREA RATIO 9 6 - 22    ESTIMATED GFR >59 >59 mL/min/1.70m^2   HEPATIC FUNCTION PANEL   Result Value Ref Range    ALBUMIN 1.5 (L) 3.4 - 4.8 g/dL    ALKALINE PHOSPHATASE 76 <150 U/L    ALT (SGPT) 7 <55 U/L    AST (SGOT) 16 8 - 41 U/L    BILIRUBIN TOTAL <0.2 (L) 0.3 - 1.3 mg/dL    BILIRUBIN DIRECT 0.1 <0.3 mg/dL    PROTEIN TOTAL 3.9 (L) 6.0 - 8.0 g/dL   MAGNESIUM   Result Value Ref Range    MAGNESIUM 1.2 (L) 1.6 - 2.5 mg/dL   PHOSPHORUS   Result Value Ref Range    PHOSPHORUS 2.3 2.3 - 4.0 mg/dL   CBC   Result Value Ref Range    WBC 4.7 3.5 - 11.0 x10^3/uL    RBC 2.83 (L) 3.63 - 4.92 x10^6/uL    HGB 8.0 (L) 11.2 - 15.2 g/dL    HCT 23.8 (L) 33.5 - 45.2 %    MCV 84.0 78.0 - 100.0 fL    MCH 28.1 27.4 - 33.0 pg    MCHC 33.5 32.5 - 35.8 g/dL    RDW 15.1 (H) 12.0 - 15.0 %    PLATELETS 242 140 - 450 x10^3/uL    MPV 8.6 7.5 - 11.5 fL   CLOSTRIDIUM DIFFICILE TOXIN DETECTION   Result Value Ref Range    CLOSTRIDIUM DIFFICILE TOXIN DETECTION Negative Negative, Indeterminate       Radiology Tests (Please indicate ordered or reviewed)  Results for orders placed or performed during the hospital encounter of 11/22/17 (from the past 72 hour(s))   CT ABDOMEN PELVIS W IV CONTRAST     Status: None    Narrative    [name]  Female, 71 years old.    CT ABDOMEN PELVIS W IV CONTRAST performed on 11/23/2017 11:04 AM.    REASON FOR EXAM:  r/o vessel thrombosis and evaluate gastric empyting, h/o  pancreatic cancer s/p robotic Whipple surgery on 10/25/17    RADIATION DOSE: 1021.50 mGycm    COMPARISON: None    FINDINGS:     LUNG BASES: Minimal bibasilar atelectasis.    LIVER: Liver is grossly normal in size and enhancement pattern.  No definite  liver lesions however there is moderate periportal edema noted. Hepatic  veins appear patent. The main, right anterior posterior and visualized  portions of the left main and portal vein are patent. The medial and  lateral segments of the left portal vein are  not well opacified limiting  evaluation for thrombus.    BILIARY SYSTEM/GALLBLADDER: No intrahepatic biliary dilatation is  identified. There is an nondistention of the common hepatic duct seen on  series 2, image 26 with mild biliary wall enhancement identified.    PANCREAS: Patient is status post Whipple procedure for pancreatic cancer.  There is moderate atrophy of the pancreatic body and tail. Fullness at the  level of the pancreaticojejunostomy is identified series 2, image 25 which  is not well evaluated on this delayed contrast exam.    KIDNEY/URETERS: Kidneys are normal in size with symmetric enhancement. No  renal calculi or hydronephrosis is identified. Multiple subcentimeter low  density foci are seen in the bilateral kidneys and are too small to  characterize. Ureters are normal in caliber.    ADRENALS: No adrenal nodules.    SPLEEN: Spleen is normal in size and enhancement pattern.    BOWEL: Limited assessment of bowel due to areas of nondistention and other  areas lacking intraluminal enteric contrast. There is diffuse thickening of  the right and proximal transverse colon and also involving the rectum. A  component could be secondary to incomplete distention. Colonic  diverticulosis is identified. No abnormally dilated small bowel loops are  seen. Small hiatal hernia is noted.    VASCULATURE/LYMPH NODES: Scattered aortic and branch vessel  atherosclerosis. The celiac axis at the level of the aorta is patent.  Limited assessment of the common hepatic artery patency due to contrast  timing bolus. SMA, SMV and main portal vein is patent. Mild haziness within  the mesentery likely represents edema.    BLADDER: Bladder is nondistended limiting evaluation of the wall.    REPRODUCTIVE ORGANS: Uterus and ovaries are present.    PERITONEAL CAVITY: Small amount of free fluid within the abdomen and  pelvis.    BONES/SOFT TISSUES: Multilevel degenerative changes are seen within the  lower thoracic and lumbar  spine. There is grade 1 anterolisthesis of L4 on  L5. Ventral abdominal wall hernia containing bowel without evidence of  entrapment or obstruction. There is a left lower quadrant soft tissue  defect with internal punctate air which may represent packing. Clinical  correlation recommended.      Impression    1. Status post Whipple procedure with no intra or extrahepatic biliary  dilatation. There is mild enhancement of the common hepatic bile duct wall  which may be reactive.  2. Moderate periportal edema with no thrombus seen within the hepatic and  portal veins. The medial lateral portions of the portal veins are not well  opacified limiting evaluation. Also the hepatic artery is not well  opacified due to contrast timing bolus and is not well evaluated.  3. Mild fullness at the pancreaticojejunostomy which is not well evaluated  and could represent small amount of fluid within the adjacent jejunal  loop..  4. Small amount of ascites throughout the abdomen and pelvis.  5. Moderate colonic wall thickening greatest involving the rectum. Findings  concerning for colitis/proctitis.  6. Ventral abdominal wall hernia containing bowel without evidence of  entrapment or obstruction. Left lower quadrant soft tissue defect with  internal punctate air which may be secondary to packing material with  infectious process not excluded.       Assessment/ Plan:   Active Hospital Problems   (*Primary Problem)    Diagnosis   . Hypokalemia   . Open abdominal wall wound       [name] is a 71 y.o. female status post robotic Whipple on October 25, 2017 presenting with pain and erythema and drainage from her left lower quadrant and poor p.o. intake and mobility at home.    Plan:   - Ok for diet and supplements  - irrigating wound vac placed   - Calorie count ordered  - Pain control  - Continue floor status    Willa Rough, DO PGY-2 11/24/17 17:31   [facility] Department of Surgery      I saw and examined the  patient.  I reviewed the resident's note.  I agree with the findings and plan of care as documented in the resident's note.  Any exceptions/additions are edited/noted.    Lynwood Dawley, MD

## 2017-11-24 NOTE — Care Plan (Signed)
Patient had a wound vac placed to the left abdominal wound this afternoon. Irrigating with dankins solution. Patient has been ambulating to bathroom with standby assist and a walker with no difficulties.

## 2017-11-24 NOTE — Nurses Notes (Signed)
Met with patient at bedside.  Assisted to bathroom - ambulates well with walker - assistance just to carry wound vac and push IV Pole.  Abdomen left lower abdomen to wound vac with drainage.  Tolerated dinner well.  Calorie count in progress.  Sitter select on. Fall precautions reviewed.  Call bell in reach.  Denies further needs.  Orson Ape, RN

## 2017-11-24 NOTE — Discharge Summary (Signed)
[facility]  DISCHARGE SUMMARY    PATIENT NAME:  [name], [name]  MRN:  [id number]  DOB:  1946/01/08    ENCOUNTER DATE:  10/25/2017  INPATIENT ADMISSION DATE: 10/25/2017  DISCHARGE DATE:  11/16/17    ATTENDING PHYSICIAN: Tish Frederickson, MD  SERVICE: SURG ONCOLOGY  PRIMARY CARE PHYSICIAN: No Pcp         PRIMARY DISCHARGE DIAGNOSIS: Pancreatic cancer (CMS [facility])  Active Hospital Problems    Diagnosis Date Noted   . Principle Problem: Pancreatic cancer (CMS [facility] 10/25/2017      Resolved Hospital Problems   No resolved problems to display.     Active Non-Hospital Problems    Diagnosis Date Noted   . Hypokalemia 11/23/2017   . Open abdominal wall wound 11/22/2017   . Asthma 01/24/2017   . Type 2 diabetes mellitus (CMS [facility]) 01/24/2017        DISCHARGE MEDICATIONS:     Current Discharge Medication List      CONTINUE these medications - NO CHANGES were made during your visit.      Details   aspirin 81 mg Tablet, Delayed Release (E.C.)  Commonly known as:  ECOTRIN   81 mg, Oral  Refills:  0     atorvastatin 10 mg Tablet  Commonly known as:  LIPITOR   10 mg, Oral  Refills:  0     cetirizine 10 mg Tablet  Commonly known as:  ZYRTEC   10 mg, Oral  Refills:  0     citalopram 20 mg Tablet  Commonly known as:  CELEXA   20 mg, Oral  Refills:  0     furosemide 20 mg Tablet  Commonly known as:  LASIX   20 mg, DAILY  Refills:  0     gabapentin 300 mg Capsule  Commonly known as:  NEURONTIN   300 mg, DAILY  Refills:  0     glipiZIDE 10 mg Tablet Extended Rel 24 hr (2)  Commonly known as:  GLUCOTROL XL   10 mg, Oral  Refills:  0     hydroCHLOROthiazide 25 mg Tablet  Commonly known as:  HYDRODIURIL   25 mg, DAILY  Refills:  0     HYDROcodone-acetaminophen 5-325 mg Tablet  Commonly known as:  NORCO   1 Tab, Oral, EVERY 4 HOURS PRN  Refills:  0     lidocaine-prilocaine 2.5-2.5 % Cream  Commonly known as:  EMLA   No dose, route, or frequency recorded.  Refills:  0     LORazepam 0.5 mg Tablet  Commonly known as:  ATIVAN   0.5 mg, DAILY   Refills:  0     losartan 100 mg Tablet  Commonly known as:  COZAAR   100 mg, Oral, DAILY  Refills:  0     MetFORMIN 1,000 mg Tablet  Commonly known as:  GLUCOPHAGE   1,000 mg, 2 TIMES DAILY WITH FOOD  Refills:  0     metoprolol succinate 50 mg Tablet Sustained Release 24 hr  Commonly known as:  TOPROL-XL   50 mg, DAILY  Refills:  0     montelukast 10 mg Tablet  Commonly known as:  SINGULAIR   10 mg, Oral  Refills:  0     omeprazole 20 mg Capsule, Delayed Release(E.C.)  Commonly known as:  PRILOSEC   20 mg, DAILY  Refills:  0     potassium chloride 20 mEq Tab Sust.Rel. Particle/Crystal  Commonly known as:  K-DUR  20 mEq, Oral  Refills:  0     prochlorperazine 10 mg Tablet  Commonly known as:  COMPAZINE   10 mg, Oral  Refills:  0     promethazine 25 mg Tablet  Commonly known as:  PHENERGAN   25 mg, Oral  Refills:  0     ZOFRAN 8 mg Tablet  Generic drug:  ondansetron   8 mg, Oral, EVERY 8 HOURS PRN  Refills:  0          Discharge med list refreshed?  YES    ALLERGIES:  Allergies   Allergen Reactions   . Sulfa (Sulfonamides) Itching             HOSPITAL PROCEDURE(S):   Bedside Procedures:  Orders Placed This Encounter   Procedures   . BEDSIDE  CENTRAL LINE INSERTION/CHANGE/REMOVAL     Surgical Procedure(s):  ROBOTIC WHIPPLE  BLOCK PRE-OP  XI ROBOT SUPPLY CARD    REASON FOR HOSPITALIZATION AND HOSPITAL COURSE     BRIEF HPI:    [name] is a 71 y.o. female with history of pancreatic cancer who was a scheduled admission on 10/25/2017 for robotic-assisted classic pancreaticoduodenectomy.    The patient was seen in PACU and was in her normal state of health. The patient was taken back to the OR and anesthesia was induced. The patient underwent robotic-assisted classic pancreaticoduodenectomy. There were no intraoperative or immediate post-operative complications.  Anesthesia was reversed and the patient was returned to the PACU in stable condition.  In the days following surgery, she began tolerating a diet while  her bowel function slowly returned.  Endocrinology was consulted due to difficulty managing her blood glucose.  Her JP drainage was also cloudy, suggesting a chyle leak.   On 10/30/17, she became more fatigued and expressed a decreased appetite.  She was placed on TPN with a clear liquid diet.  She also developed hyponatremia and required free water restriction.    The patient was determined safe for discharge on 11/28/17.  Prior to discharge the patient was tolerating a diet, her pain was well controlled on oral pain medications, she felt safe for discharge home.  The patient will follow up with Dr. Cyndi Bender in 2 weeks for reevaluation.  All questions were answered prior to discharge and the patient agreed to be discharged at this time. The patient was instructed to follow up sooner for new or concerning symptoms.          CONDITION ON DISCHARGE:  A. Ambulation: Ambulation with assistive device  B. Self-care Ability: With partial assistance  C. Cognitive Status Alert and Oriented x 3  D. Code status at discharge:   Code Status Information     Code Status    Full Code                 LINES/DRAINS/WOUNDS AT DISCHARGE:   Patient Lines/Drains/Airways Status    Active Line / Dialysis Catheter / Dialysis Graft / Drain / Airway / Wound     Name: Placement date: Placement time: Site: Days:    Wound (Non-Surgical) Right Neck  10/30/17   0800   25    Wound (Non-Surgical) Anterior;Left Abdomen  11/10/17   1900   14                DISCHARGE DISPOSITION:  Home discharge              DISCHARGE INSTRUCTIONS:       CBC/DIFF  BASIC METABOLIC PANEL, NON-FASTING     ALT (SGPT)     AST (SGOT)     BILIRUBIN TOTAL     GAMMA GT     PROTEIN TOTAL     BUN     ALK PHOS (ALKALINE PHOSPHATASE)     DISCHARGE INSTRUCTION - RESUME HOME DIET     Diet: RESUME HOME DIET      DISCHARGE INSTRUCTION - ACTIVITY - MAY RESUME PREVIOUS ACTIVITY     Activity: MAY RESUME PREVIOUS ACTIVITY      DISCHARGE INSTRUCTION - INCISION/WOUND CARE    POST-OPERATIVE  WOUND CARE   From the day of placement, the stiches/staples on the surgical wound will be removed in 10-14 days. You will have an appointment with your surgeon for the removal.   [place] the wounds at least once a day with warm, soapy water. You may do this easily in the shower. No swimming or submerging the wound, like in a pool, bathtub or hot tub.  Pat the wound dry. DO NOT RUB!  Please refer to your diet and activity instructions. You are on these restrictions until your clinic appointment(s) and evaluation of your healing.  SEEK MEDICAL ATTENTION IF:  There is redness, swelling or increased pain in the wound that is not controlled with pain medications as prescribed.  There is drainage, blood or pus coming from the wound lasting longer than 1 day, or sooner if there is a concern.  You develop signs of a generalized infection including muscle aches, chills, fever or a general ill feeling.  You notice a foul smell coming from the wound or dressing.  You developed persistent nausea or vomiting.     Instructions for incision/wound care: z - other (specify in comments)      [place] health order:    Nursing visits for assessment, TPN education and management, medication management, patient and family education, post discharge instruction review and reinforcement. PT/OT eval and treat.     I certify this patient is under my care as an attending physician and that I, or a collaborating ANP/PA had a face to face encounter with the patient or the durable medical power of attorney, as appropriate and explained the need for home health services on the following date: 11/08/2017    The primary diagnosis as discussed in the face to face encounter justifying the need for home health services/DME is as follows: Pancreatic Cancer    I certify that based on my findings that the following home health services are medically necessary: [facility]    I certify that based on my  findings that the following home health services are medically necessary: OCCUPATIONAL THERAPY    I certify that based on my findings that the following home health services are medically necessary: PHYSICAL THERAPY    My clinical findings support the need for the services listed because: Nursing visits for assessment, TPN education and management, medication management, patient and family education, post discharge instruction review and reinforcement. PT/OT eval and treat.    I certify that my clinical findings support that this patient is homebound because: - Absence from home requires considerable and taxing effort    Describe considerable and taxing effort that qualifies patient as homebound Limited ambulation due to weakness; fatigues quickly with minimal exertion.    Start Date 11/06/2017    A plan of care has been established and will be periodically reviewed by  a physician: Yes    Resumption of Home Health Order. PCP/Ordering MD has permission to sign future home health orders: YES           Juleen China, MD      Copies sent to Care Team       Relationship Specialty Notifications Start End    Pcp, No PCP - General   10/18/17           Referring providers can utilize [email or web address] to access their referred [place] patient's information.

## 2017-11-24 NOTE — Care Plan (Signed)
Medical Nutrition Therapy Assessment        SUBJECTIVE : Familiar with patient from recent admission and visited her yesterday.  She was still complaining of mild abdominal pain but stated she had been eating better but indicated it was still probably enough.  Her UBW is about 185 lbs with weight loss noted years ago (she was 285 lbs several years ago).. 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 female status post robotic Whipple on October 25, 2017 presenting with pain and erythema and drainage from her left lower quadrant and poor p.o. intake and mobility at home.        Recommend :   Continue current diet and chocolate ensure TID  Calorie count to run 12/14 to 12/16.  Monitor weekly weights.   Will follow.     Nutrition Diagnosis: Inadequate energy intake  (possibly) related to recent surgery, hosptialization as evidenced by need for nutrient dense, supplements, calorie count    [name], RD, LD, CNSC 11/24/2017, 08:57  Pager [phone]

## 2017-11-25 LAB — CBC
HCT: 25 % — ABNORMAL LOW (ref 33.5–45.2)
HGB: 8.3 g/dL — ABNORMAL LOW (ref 11.2–15.2)
MCH: 28.2 pg (ref 27.4–33.0)
MCHC: 33.2 g/dL (ref 32.5–35.8)
MCV: 84.9 fL (ref 78.0–100.0)
MPV: 8.2 fL (ref 7.5–11.5)
PLATELETS: 242 10*3/uL (ref 140–450)
RBC: 2.94 10*6/uL — ABNORMAL LOW (ref 3.63–4.92)
RDW: 15.6 % — ABNORMAL HIGH (ref 12.0–15.0)
WBC: 4.9 10*3/uL (ref 3.5–11.0)

## 2017-11-25 LAB — BASIC METABOLIC PANEL
ANION GAP: 5 mmol/L (ref 4–13)
BUN/CREA RATIO: 14 (ref 6–22)
BUN: 8 mg/dL (ref 8–25)
CALCIUM: 7.5 mg/dL — ABNORMAL LOW (ref 8.5–10.2)
CHLORIDE: 101 mmol/L (ref 96–111)
CO2 TOTAL: 25 mmol/L (ref 22–32)
CREATININE: 0.57 mg/dL (ref 0.49–1.10)
ESTIMATED GFR: >59 mL/min/1.73mˆ2 (ref >59)
GLUCOSE: 184 mg/dL — ABNORMAL HIGH (ref 65–139)
POTASSIUM: 4.1 mmol/L (ref 3.5–5.1)
SODIUM: 131 mmol/L — ABNORMAL LOW (ref 136–145)

## 2017-11-25 LAB — HEPATIC FUNCTION PANEL
ALBUMIN: 1.5 g/dL — ABNORMAL LOW (ref 3.4–4.8)
ALKALINE PHOSPHATASE: 80 U/L (ref <150)
ALT (SGPT): 6 U/L (ref <55)
AST (SGOT): 9 U/L (ref 8–41)
AST (SGOT): 9 U/L (ref 8–41)
BILIRUBIN DIRECT: 0.1 mg/dL (ref <0.3)
BILIRUBIN TOTAL: 0.2 mg/dL — ABNORMAL LOW (ref 0.3–1.3)
PROTEIN TOTAL: 4 g/dL — ABNORMAL LOW (ref 6.0–8.0)

## 2017-11-25 LAB — MAGNESIUM: MAGNESIUM: 1.3 mg/dL — ABNORMAL LOW (ref 1.6–2.5)

## 2017-11-25 LAB — PREALBUMIN: PREALBUMIN: 5.4 mg/dL — ABNORMAL LOW (ref 18.0–45.0)

## 2017-11-25 LAB — PHOSPHORUS: PHOSPHORUS: 2.4 mg/dL (ref 2.3–4.0)

## 2017-11-25 IMAGING — CT CT CHEST W/ CM
3 of 5 series · 14 of 36 positions shown, 17 images · IV contrast (iopamidol)
Comparison: Chest radiograph 05/26/2017.

CLINICAL DATA: Pancreatic adenocarcinoma.  Restaging.

EXAM:
CT CHEST, ABDOMEN, AND PELVIS WITH CONTRAST
TECHNIQUE: Multidetector CT imaging of the chest, abdomen and pelvis was
performed following the standard protocol during bolus
administration of intravenous contrast.
CONTRAST:  100mL [id number] IOPAMIDOL ([id number]) INJECTION 61%

[Series 2: cap with · axial · 0.74mm/px · z∈[+918,+1398]mm · 9 of 120 slices shown, 12 images]
[im 12/120  mediastinal]
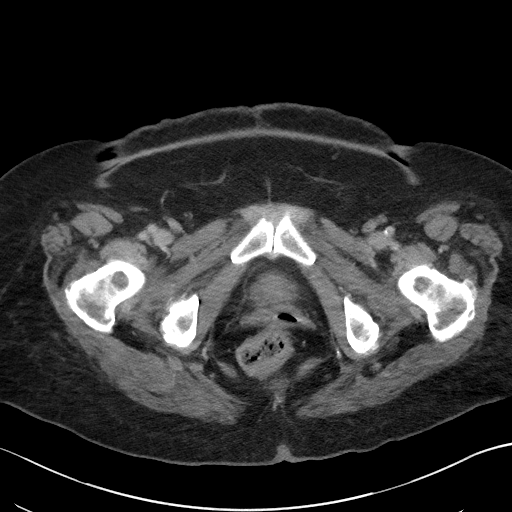
[im 12/120  lung]
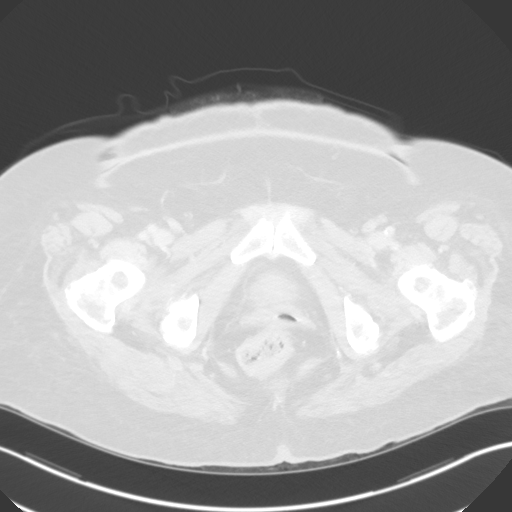
[im 24/120  lung]
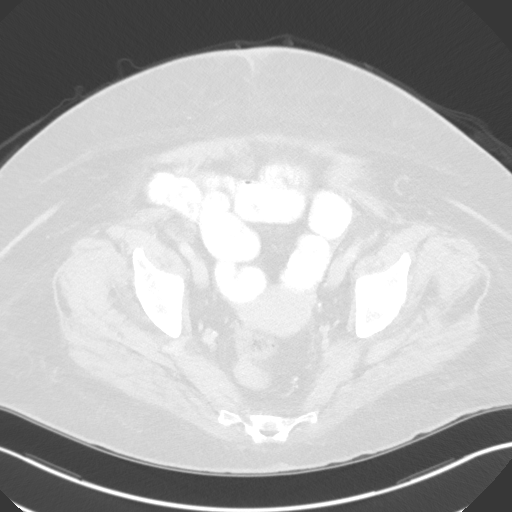
[im 36/120  lung]
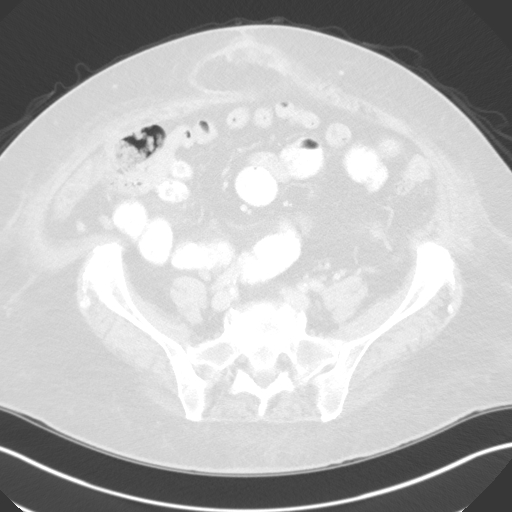
[im 48/120  lung]
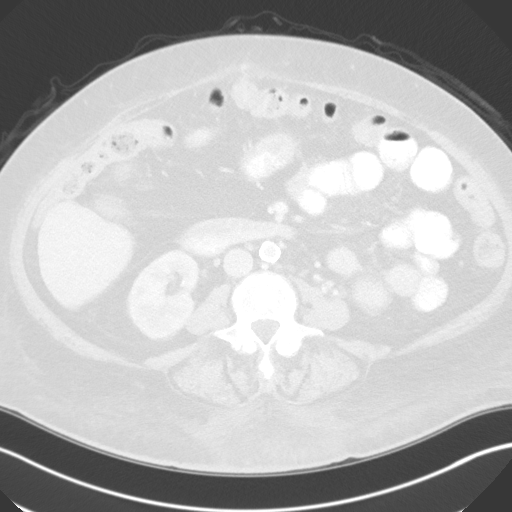
[im 60/120  mediastinal]
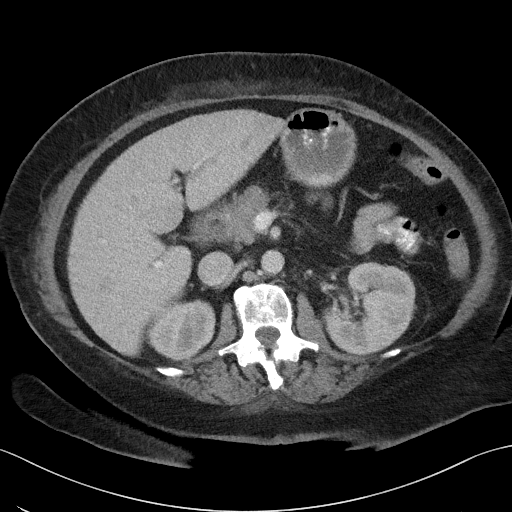
[im 60/120  lung]
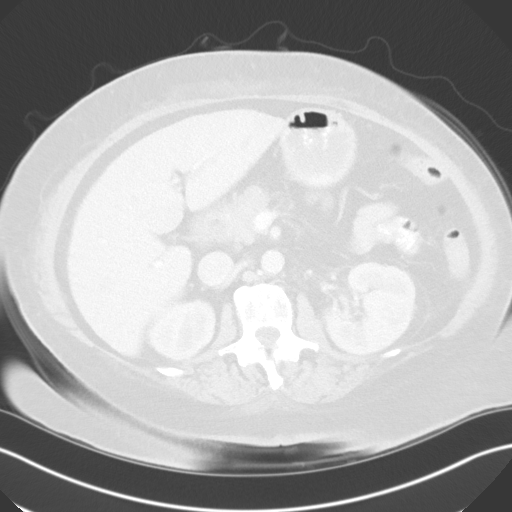
[im 72/120  lung]
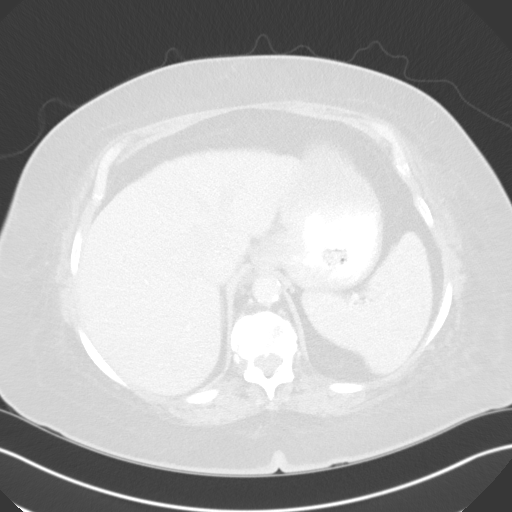
[im 84/120  lung]
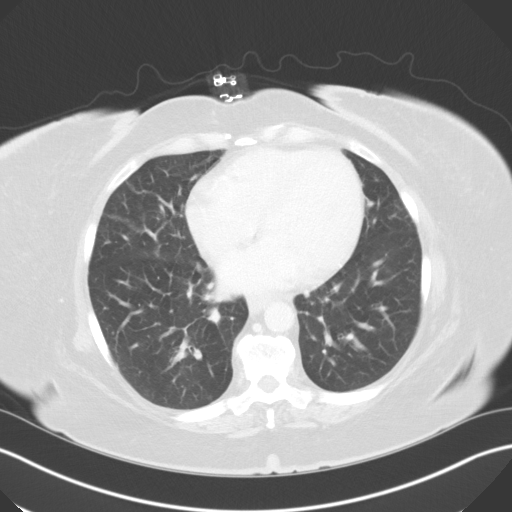
[im 96/120  lung]
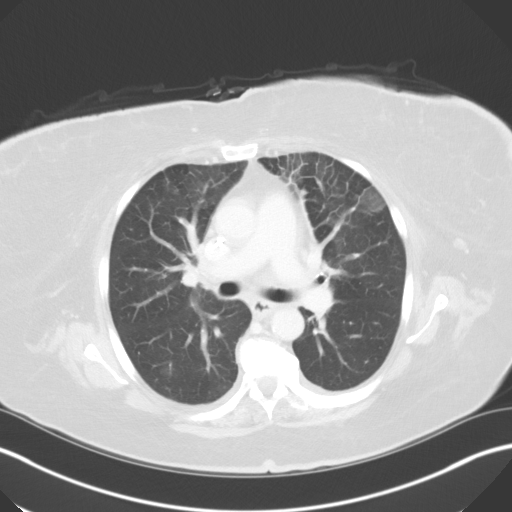
[im 108/120  mediastinal]
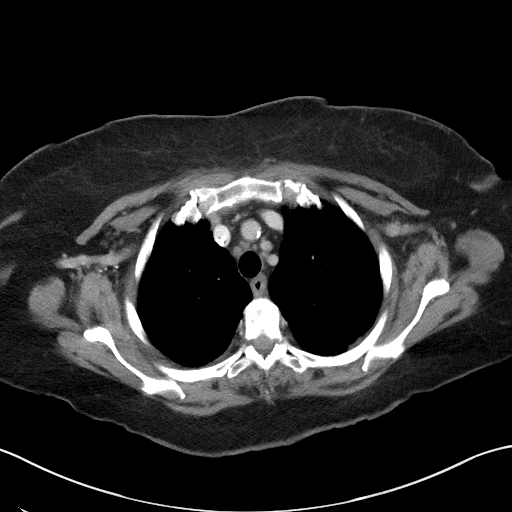
[im 108/120  lung]
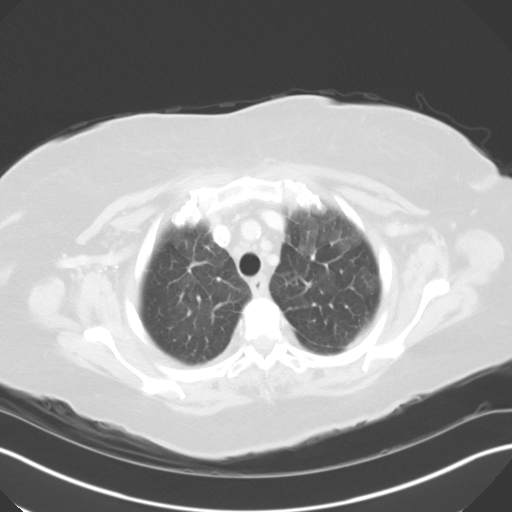

[Series 3: coronals · coronal · 0.84mm/px · 3 of 165 slices shown]
[im 33/165  lung]
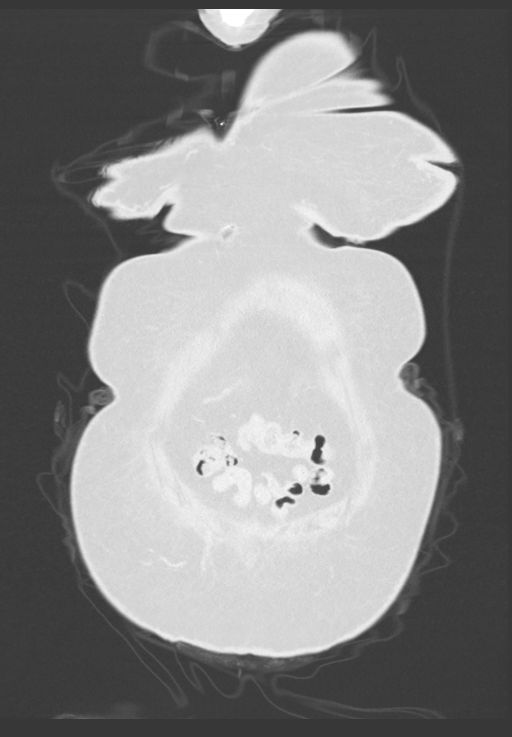
[im 66/165  lung]
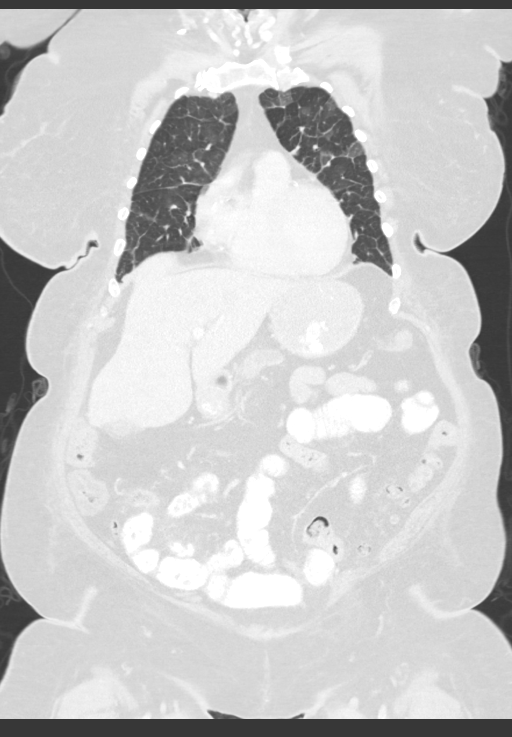
[im 99/165  lung]
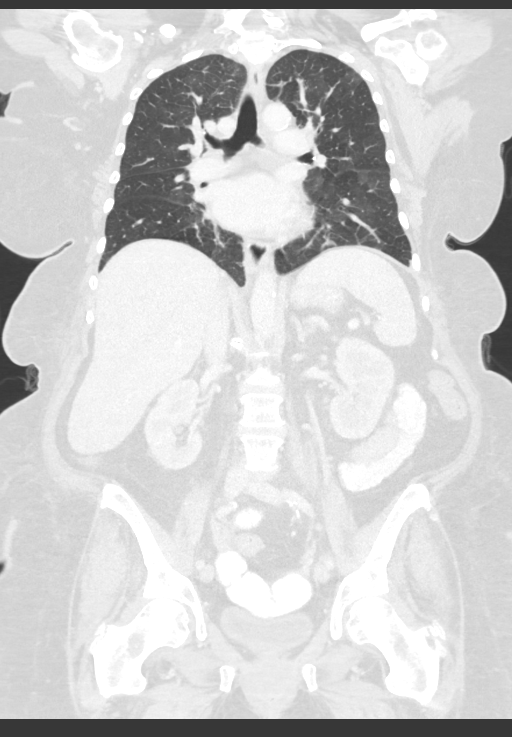

[Series 7: lung · axial · 0.74mm/px · z∈[+1190,+1238]mm · 2 of 147 slices shown]
[im 13/147  lung]
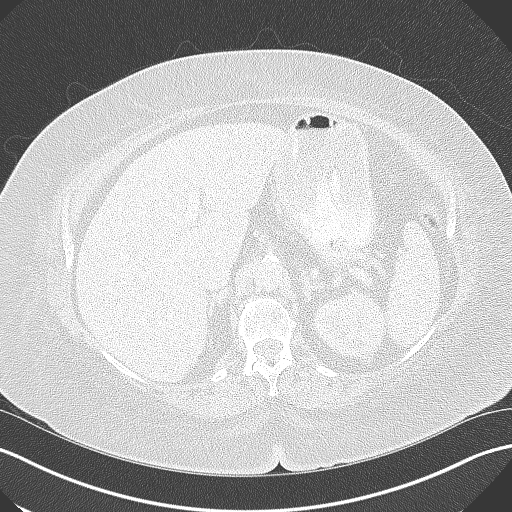
[im 37/147  lung]
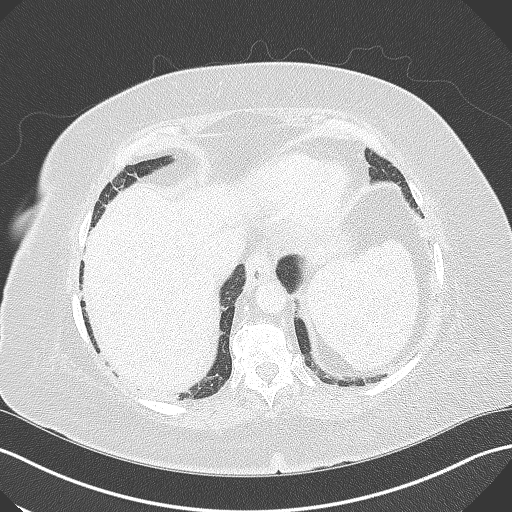

[14 of 36 positions shown; findings below may reference images not displayed]

PET 03/27/2017. Abdominal
MRI of 03/17/2017. CT of the abdomen of 03/16/2017. Most recent
chest CT 03/08/2010.
FINDINGS: CT CHEST FINDINGS

Cardiovascular: Aortic and branch vessel atherosclerosis. Mild
cardiomegaly. Multivessel coronary artery atherosclerosis.
Right-sided Port-A-Cath terminates at the high right atrium.

Mediastinum/Nodes: No supraclavicular adenopathy. No mediastinal or
hilar adenopathy.

Lungs/Pleura: Trace bilateral pleural fluid. Smooth septal
thickening is most apparent at the apices. Scattered areas of
ground-glass opacity.

Musculoskeletal: No acute osseous abnormality.

CT ABDOMEN PELVIS FINDINGS

Hepatobiliary: Hepatomegaly at 19.3 cm craniocaudal. No focal liver
lesion. Suspect small gallstones, without acute cholecystitis or
biliary duct dilatation.

Pancreas: Pancreatic atrophy and upstream duct dilatation identified
within the tail and body. Pancreatic head mass measures on the order
of 4.1 x 3.1 cm on image 62/ series 2. Compare 4.0 x 3.0 cm on the
prior MRI (when remeasured). No acute pancreatitis.

Spleen: Normal in size, without focal abnormality.

Adrenals/Urinary Tract: Normal adrenal glands. Normal kidneys,
without hydronephrosis. Normal urinary bladder.

Stomach/Bowel: Tiny hiatal hernia. Normal distal stomach. Scattered
colonic diverticula. Normal terminal ileum and appendix. Normal
small bowel.

Vascular/Lymphatic: Abdominal aortic and branch vessel
atherosclerosis.

Retroaortic left renal vein. There is contact with less than 180
degrees of the superior mesenteric vein on image 62/series 2.
Anterior aspect of the splenoportal confluence is also contacted by
tumor including on image 60/series 2. No narrowing. No arterially
involvement of the celiac or superior mesenteric artery.

No abdominopelvic adenopathy.

Reproductive: Normal uterus and adnexa.

Other: No significant free fluid. Moderate pelvic floor laxity.
Ventral abdominal wall laxity.

Musculoskeletal: Trace L4-5 anterolisthesis is likely degenerative.
Degenerate disc disease involves the lumbosacral junction.
IMPRESSION: 1. Given cross modality comparison, similar size of a pancreatic
head mass since 03/17/2017.
[DATE]. Tumor contact with the splenoportal confluence and superior
mesenteric vein, as above. No arterial encasement.
3. Coronary artery atherosclerosis. Aortic Atherosclerosis
([id number]).
4. Mild congestive heart failure with bilateral pleural effusions.
5. Hepatomegaly.
6. Probable cholelithiasis.

## 2017-11-25 MED ORDER — CALCIUM 200 MG (AS CALCIUM CARBONATE 500 MG) CHEWABLE TABLET
500.0000 mg | CHEWABLE_TABLET | Freq: Three times a day (TID) | ORAL | Status: DC
Start: 2017-11-25 — End: 2017-11-30
  Administered 2017-11-25 – 2017-11-28 (×11): 500 mg via ORAL
  Administered 2017-11-29: 0 mg via ORAL
  Administered 2017-11-29 – 2017-11-30 (×3): 500 mg via ORAL
  Filled 2017-11-25 (×17): qty 1

## 2017-11-25 MED ORDER — MAGNESIUM SULFATE ORAL SOLN 500MG/ML
50.00 mg/kg | Freq: Three times a day (TID) | ORAL | Status: DC
Start: 2017-11-25 — End: 2017-11-25

## 2017-11-25 MED ORDER — MAGNESIUM SULFATE 4 GRAM/100 ML (4 %) IN WATER INTRAVENOUS PIGGYBACK
4.00 g | INJECTION | Freq: Once | INTRAVENOUS | Status: AC
Start: 2017-11-25 — End: 2017-11-25
  Administered 2017-11-25: 4 g via INTRAVENOUS
  Administered 2017-11-25: 0 g via INTRAVENOUS
  Filled 2017-11-25: qty 100

## 2017-11-25 MED ADMIN — sodium chloride 0.45 % intravenous solution: ORAL | @ 09:00:00 | NDC 00338004304

## 2017-11-25 MED ADMIN — lactated Ringers intravenous solution: ORAL | @ 21:00:00 | NDC 00264775000

## 2017-11-25 NOTE — Progress Notes (Signed)
[facility]  Surgery Progress Note    [place], [name], 71 y.o. female  Date of Birth:  01-21-1946  Date of Admission:  11/22/2017  Date of service: 11/25/2017    Post Op Day:   S/P   Chief Complaint: increased pain and drainage from Left groin; increased fatigue  Subjective: NAEO.  Some abdominal pain.  Did not walk yesterday but did get up to the chair.  Tolerating regular diet.    Vital Signs:  Temp (24hrs) Max:36.9 C (76.7 F)      Systolic (34LPF), XTK:240 , Min:132 , XBD:532     Diastolic (99MEQ), AST:41, Min:60, Max:76    Temp  Avg: 36.7 C (98.1 F)  Min: 36.4 C (97.6 F)  Max: 36.9 C (98.4 F)  Pulse  Avg: 77.5  Min: 70  Max: 92  Resp  Avg: 18  Min: 18  Max: 18  SpO2  Avg: 94.5 %  Min: 92 %  Max: 97 %  Pain Score (Numeric, Faces): 8    Today's Physical Exam:  Temperature: 36.4 C (97.6 F)  Heart Rate: 70  BP (Non-Invasive): 132/74  Respiratory Rate: 18  SpO2-1: 97 %  Pain Score (Numeric, Faces): 8  General:  NAD  Eyes:  Conjunctiva clear  HENT:   Mucous membranes moist  Lungs: Nonlabored breathing, Normal respiratory effort  Cardiovascular:  Normal rate  Abdomen:  Soft, nontender, nondistended, moderately TTP LLQ and L groin, not TTP in other quadrants  Extremities: No cyanosis or edema.  Skin:  Skin warm and dry.  Neurologic:  Alert and oriented x3, grossly normal  Psychiatric:  Affect normal.    Current Medications:    Current Facility-Administered Medications:  atorvastatin (LIPITOR) tablet 10 mg Oral QPM   calcium carbonate (TUMS) chewable tablet 500 mg Oral 3x/day   cetirizine (ZYRTEC) tablet 10 mg Oral Daily   citalopram (CELEXA) tablet 20 mg Oral Daily   enoxaparin PF (LOVENOX) 40 mg/0.4 mL SubQ injection 40 mg Subcutaneous Q24H   gabapentin (NEURONTIN) capsule 300 mg Oral Daily   HYDROcodone-acetaminophen (NORCO) 10-325 mg per tablet 1 Tab Oral Q4H PRN   HYDROcodone-acetaminophen (NORCO) 5-325 mg per tablet 1 Tab Oral Q4H PRN   metoprolol succinate (TOPROL-XL) 24 hr extended release  tablet 50 mg Oral Daily   montelukast (SINGULAIR) 10 mg tablet 10 mg Oral QPM   NS flush syringe 2 mL Intracatheter Q8HRS   And      NS flush syringe 2-6 mL Intracatheter Q1 MIN PRN   NS premix infusion  Intravenous Continuous   ondansetron (ZOFRAN) tablet 8 mg Oral Q8H PRN   oxyCODONE (ROXICODONE) immediate release tablet 5 mg Oral Q4H PRN   pantoprazole (PROTONIX) delayed release tablet 20 mg Oral Daily before Breakfast   prochlorperazine (COMPAZINE) tablet 10 mg Oral Q6H PRN   promethazine (PHENERGAN) tablet 25 mg Oral Q6H PRN   sodium hypochlorite (DAKINS) 0.125% (quarter strength) irrigation for wound vac  Irrigation Continuous       I/O:  I/O last 24 hours:      Intake/Output Summary (Last 24 hours) at 11/25/2017 1746  Last data filed at 11/25/2017 1744  Gross per 24 hour   Intake 3390 ml   Output 0 ml   Net 3390 ml     I/O current shift:  12/15 0700 - 12/15 1859  In: 9622 [P.O.:1240; I.V.:1000]  Out: 0     Prophylaxis:  Date Started Date Completed   DVT/PE  Enoxaparin and SCDs/ Venodynes/Impulse boots  GI: Proton Pump inhibitor       Nutrition/Residuals:  DIET REGULAR  DIETARY ORAL SUPPLEMENTS Oral Supplements with tray: Ensure High Protein-Chocolate; BREAKFAST/LUNCH/DINNER; 1 Can  ROOM SERVICE:  NEEDS VISIT FOR MENU CHOICES    Labs  (Please indicate ordered or reviewed)  Reviewed: Lab Results for Last 24 Hours:    Results for orders placed or performed during the hospital encounter of 11/22/17 (from the past 24 hour(s))   PREALBUMIN   Result Value Ref Range    PREALBUMIN 5.4 (L) 18.0 - 45.0 mg/dL   BASIC METABOLIC PANEL   Result Value Ref Range    SODIUM 131 (L) 136 - 145 mmol/L    POTASSIUM 4.1 3.5 - 5.1 mmol/L    CHLORIDE 101 96 - 111 mmol/L    CO2 TOTAL 25 22 - 32 mmol/L    ANION GAP 5 4 - 13 mmol/L    CALCIUM 7.5 (L) 8.5 - 10.2 mg/dL    GLUCOSE 184 (H) 65 - 139 mg/dL    BUN 8 8 - 25 mg/dL    CREATININE 0.57 0.49 - 1.10 mg/dL    BUN/CREA RATIO 14 6 - 22    ESTIMATED GFR >59 >59 mL/min/1.64m^2   CBC    Result Value Ref Range    WBC 4.9 3.5 - 11.0 x10^3/uL    RBC 2.94 (L) 3.63 - 4.92 x10^6/uL    HGB 8.3 (L) 11.2 - 15.2 g/dL    HCT 25.0 (L) 33.5 - 45.2 %    MCV 84.9 78.0 - 100.0 fL    MCH 28.2 27.4 - 33.0 pg    MCHC 33.2 32.5 - 35.8 g/dL    RDW 15.6 (H) 12.0 - 15.0 %    PLATELETS 242 140 - 450 x10^3/uL    MPV 8.2 7.5 - 11.5 fL   HEPATIC FUNCTION PANEL   Result Value Ref Range    ALBUMIN 1.5 (L) 3.4 - 4.8 g/dL    ALKALINE PHOSPHATASE 80 <150 U/L    ALT (SGPT) 6 <55 U/L    AST (SGOT) 9 8 - 41 U/L    BILIRUBIN TOTAL 0.2 (L) 0.3 - 1.3 mg/dL    BILIRUBIN DIRECT 0.1 <0.3 mg/dL    PROTEIN TOTAL 4.0 (L) 6.0 - 8.0 g/dL   MAGNESIUM   Result Value Ref Range    MAGNESIUM 1.3 (L) 1.6 - 2.5 mg/dL   PHOSPHORUS   Result Value Ref Range    PHOSPHORUS 2.4 2.3 - 4.0 mg/dL       Radiology Tests (Please indicate ordered or reviewed)  Results for orders placed or performed during the hospital encounter of 11/22/17 (from the past 72 hour(s))   CT ABDOMEN PELVIS W IV CONTRAST     Status: None    Narrative    Shawnae Tasso  Female, 71 years old.    CT ABDOMEN PELVIS W IV CONTRAST performed on 11/23/2017 11:04 AM.    REASON FOR EXAM:  r/o vessel thrombosis and evaluate gastric empyting, h/o  pancreatic cancer s/p robotic Whipple surgery on 10/25/17    RADIATION DOSE: 1021.50 mGycm    COMPARISON: None    FINDINGS:     LUNG BASES: Minimal bibasilar atelectasis.    LIVER: Liver is grossly normal in size and enhancement pattern. No definite  liver lesions however there is moderate periportal edema noted. Hepatic  veins appear patent. The main, right anterior posterior and visualized  portions of the left main and portal vein are patent. The medial and  lateral segments of the left portal vein are not well opacified limiting  evaluation for thrombus.    BILIARY SYSTEM/GALLBLADDER: No intrahepatic biliary dilatation is  identified. There is an nondistention of the common hepatic duct seen on  series 2, image 26 with mild biliary wall  enhancement identified.    PANCREAS: Patient is status post Whipple procedure for pancreatic cancer.  There is moderate atrophy of the pancreatic body and tail. Fullness at the  level of the pancreaticojejunostomy is identified series 2, image 25 which  is not well evaluated on this delayed contrast exam.    KIDNEY/URETERS: Kidneys are normal in size with symmetric enhancement. No  renal calculi or hydronephrosis is identified. Multiple subcentimeter low  density foci are seen in the bilateral kidneys and are too small to  characterize. Ureters are normal in caliber.    ADRENALS: No adrenal nodules.    SPLEEN: Spleen is normal in size and enhancement pattern.    BOWEL: Limited assessment of bowel due to areas of nondistention and other  areas lacking intraluminal enteric contrast. There is diffuse thickening of  the right and proximal transverse colon and also involving the rectum. A  component could be secondary to incomplete distention. Colonic  diverticulosis is identified. No abnormally dilated small bowel loops are  seen. Small hiatal hernia is noted.    VASCULATURE/LYMPH NODES: Scattered aortic and branch vessel  atherosclerosis. The celiac axis at the level of the aorta is patent.  Limited assessment of the common hepatic artery patency due to contrast  timing bolus. SMA, SMV and main portal vein is patent. Mild haziness within  the mesentery likely represents edema.    BLADDER: Bladder is nondistended limiting evaluation of the wall.    REPRODUCTIVE ORGANS: Uterus and ovaries are present.    PERITONEAL CAVITY: Small amount of free fluid within the abdomen and  pelvis.    BONES/SOFT TISSUES: Multilevel degenerative changes are seen within the  lower thoracic and lumbar spine. There is grade 1 anterolisthesis of L4 on  L5. Ventral abdominal wall hernia containing bowel without evidence of  entrapment or obstruction. There is a left lower quadrant soft tissue  defect with internal punctate air which may  represent packing. Clinical  correlation recommended.      Impression    1. Status post Whipple procedure with no intra or extrahepatic biliary  dilatation. There is mild enhancement of the common hepatic bile duct wall  which may be reactive.  2. Moderate periportal edema with no thrombus seen within the hepatic and  portal veins. The medial lateral portions of the portal veins are not well  opacified limiting evaluation. Also the hepatic artery is not well  opacified due to contrast timing bolus and is not well evaluated.  3. Mild fullness at the pancreaticojejunostomy which is not well evaluated  and could represent small amount of fluid within the adjacent jejunal  loop..  4. Small amount of ascites throughout the abdomen and pelvis.  5. Moderate colonic wall thickening greatest involving the rectum. Findings  concerning for colitis/proctitis.  6. Ventral abdominal wall hernia containing bowel without evidence of  entrapment or obstruction. Left lower quadrant soft tissue defect with  internal punctate air which may be secondary to packing material with  infectious process not excluded.       Assessment/ Plan:   Active Hospital Problems   (*Primary Problem)    Diagnosis   . Hypokalemia   . Open abdominal wall wound  [name] is a 71 y.o. female status post robotic Whipple on October 25, 2017 presenting with pain and erythema and drainage from her left lower quadrant and poor p.o. intake and mobility at home.    Plan:   - Continue regular diet with Ensure supplements  - Irrigating wound vac in place  - F/u calorie count  - Replace mag  - Pain control  - Continue floor status    Juleen China, MD  11/26/2017, 02:09

## 2017-11-25 NOTE — Nurses Notes (Signed)
Patient laying in bed watching tv.  No complaints of pain, shortness of breath, or other discomforts.  IV intact and infusing with no difficulty.  Wound vac to left abd and draining tan drainage.  No s/s of acute distress noted.

## 2017-11-26 LAB — CBC
HCT: 25.2 % — ABNORMAL LOW (ref 33.5–45.2)
HGB: 8.4 g/dL — ABNORMAL LOW (ref 11.2–15.2)
MCH: 28.5 pg (ref 27.4–33.0)
MCHC: 33.4 g/dL (ref 32.5–35.8)
MCV: 85.1 fL (ref 78.0–100.0)
MPV: 8.4 fL (ref 7.5–11.5)
MPV: 8.4 fL (ref 7.5–11.5)
PLATELETS: 270 x10ˆ3/uL (ref 140–450)
RBC: 2.96 10*6/uL — ABNORMAL LOW (ref 3.63–4.92)
RDW: 15.6 % — ABNORMAL HIGH (ref 12.0–15.0)
WBC: 5.3 x10ˆ3/uL (ref 3.5–11.0)

## 2017-11-26 LAB — HEPATIC FUNCTION PANEL
ALBUMIN: 1.6 g/dL — ABNORMAL LOW (ref 3.4–4.8)
ALKALINE PHOSPHATASE: 88 U/L (ref <150)
ALT (SGPT): 6 U/L (ref <55)
AST (SGOT): 11 U/L (ref 8–41)
BILIRUBIN DIRECT: 0.1 mg/dL (ref <0.3)
BILIRUBIN TOTAL: 0.2 mg/dL — ABNORMAL LOW (ref 0.3–1.3)
PROTEIN TOTAL: 4 g/dL — ABNORMAL LOW (ref 6.0–8.0)

## 2017-11-26 LAB — BASIC METABOLIC PANEL
ANION GAP: 4 mmol/L (ref 4–13)
BUN/CREA RATIO: 17 (ref 6–22)
BUN: 10 mg/dL (ref 8–25)
CALCIUM: 7.6 mg/dL — ABNORMAL LOW (ref 8.5–10.2)
CHLORIDE: 101 mmol/L (ref 96–111)
CO2 TOTAL: 27 mmol/L (ref 22–32)
CREATININE: 0.58 mg/dL (ref 0.49–1.10)
ESTIMATED GFR: >59 mL/min/1.73mˆ2 (ref >59)
GLUCOSE: 197 mg/dL — ABNORMAL HIGH (ref 65–139)
POTASSIUM: 4.2 mmol/L (ref 3.5–5.1)
SODIUM: 132 mmol/L — ABNORMAL LOW (ref 136–145)

## 2017-11-26 LAB — MAGNESIUM: MAGNESIUM: 1.6 mg/dL (ref 1.6–2.5)

## 2017-11-26 LAB — PHOSPHORUS: PHOSPHORUS: 2.2 mg/dL — ABNORMAL LOW (ref 2.3–4.0)

## 2017-11-26 MED ADMIN — sodium chloride 0.9 % (flush) injection syringe: @ 14:00:00

## 2017-11-26 MED ADMIN — heparin (porcine) 5,000 unit/mL injection solution: SUBCUTANEOUS | @ 08:00:00

## 2017-11-26 MED ADMIN — sodium chloride 0.9 % (flush) injection syringe: @ 06:00:00

## 2017-11-26 MED ADMIN — insulin aspart 100 unit/mL subcutaneous pen: ORAL

## 2017-11-26 MED ADMIN — sodium chloride 0.9 % (flush) injection syringe: @ 19:00:00

## 2017-11-26 NOTE — Nurses Notes (Signed)
Spoke with Kallie Edward from surgery oncology, MD updated that nurse staffing and stat nursing staff unable to obtain IV access, recommend either PICC line or midline for IV access. MD stated it is ok to leave IV out at this time. Patient going to be discharge tomorrow.   Creig Hines, RN  11/26/2017, 13:13

## 2017-11-26 NOTE — Care Plan (Signed)
Ongoing (see interventions/notes)  Adult Inpatient Plan of Care  Plan of Care Review  11/26/2017 1726 - Ongoing (see interventions/notes) by Creig Hines, RN  Flowsheets  Taken 11/26/2017 1533   Plan of Care Reviewed With  patient;sibling  Patient-Specific Goal (Individualization)  11/26/2017 1726 - Ongoing (see interventions/notes) by Creig Hines, RN  Flowsheets  Taken 11/26/2017 1726   Individualized Care Needs  patient likes to be called Rod Holler.  Anxieties, Fears or Concerns  What is the plan for her d/c in am?  Patient-Specific Goals (Include Timeframe)  She wants to complete her rehab stay to be able to go.  Absence of Hospital-Acquired Illness or Injury  11/26/2017 1726 - Ongoing (see interventions/notes) by Creig Hines, RN  Optimal Comfort and Wellbeing  11/26/2017 1726 - Ongoing (see interventions/notes) by Creig Hines, RN  Rounds/Family Conference  11/26/2017 1726 - Ongoing (see interventions/notes) by Creig Hines, RN  Fall Injury Risk  Absence of Fall and Fall-Related Injury  11/26/2017 1726 - Ongoing (see interventions/notes) by Creig Hines, RN  Skin Injury Risk Increased  Skin Health and Integrity  11/26/2017 1726 - Ongoing (see interventions/notes) by Creig Hines, RN

## 2017-11-26 NOTE — Progress Notes (Signed)
[facility]  Surgery Progress Note    [place], [name], 71 y.o. female  Date of Birth:  [birth year]  Date of Admission:  11/22/2017  Date of service: 11/26/2017    Post Op Day:   S/P   Chief Complaint: NAEO. No complaints. Tolerating diet and eating well. No abdominal pain. +BM/flatus.    Vital Signs:  Temp (24hrs) Max:36.8 C (70.2 F)      Systolic (63ZCH), YIF:027 , Min:125 , XAJ:287     Diastolic (86VEH), MCN:47, Min:61, Max:74    Temp  Avg: 36.6 C (97.8 F)  Min: 36.4 C (97.5 F)  Max: 36.8 C (98.2 F)  Pulse  Avg: 73.3  Min: 69  Max: 78  Resp  Avg: 18  Min: 18  Max: 18  SpO2  Avg: 95.5 %  Min: 93 %  Max: 98 %  Pain Score (Numeric, Faces): 0    Today's Physical Exam:  Temperature: 36.4 C (97.5 F)  Heart Rate: 76  BP (Non-Invasive): (!) 142/74(rn notified)  Respiratory Rate: 18  SpO2-1: 98 %  Pain Score (Numeric, Faces): 0  General:  NAD  Eyes:  Conjunctiva clear  HENT:   Mucous membranes moist  Lungs: Nonlabored breathing, Normal respiratory effort  Cardiovascular:  Normal rate  Abdomen:  Soft, nontender, nondistended, minimally TTP LLQ, vac in place  Extremities: No cyanosis or edema.  Skin:  Skin warm and dry.  Neurologic:  Alert and oriented x3, grossly normal  Psychiatric:  Affect normal.    Current Medications:    Current Facility-Administered Medications:  atorvastatin (LIPITOR) tablet 10 mg Oral QPM   calcium carbonate (TUMS) chewable tablet 500 mg Oral 3x/day   cetirizine (ZYRTEC) tablet 10 mg Oral Daily   citalopram (CELEXA) tablet 20 mg Oral Daily   enoxaparin PF (LOVENOX) 40 mg/0.4 mL SubQ injection 40 mg Subcutaneous Q24H   gabapentin (NEURONTIN) capsule 300 mg Oral Daily   HYDROcodone-acetaminophen (NORCO) 10-325 mg per tablet 1 Tab Oral Q4H PRN   HYDROcodone-acetaminophen (NORCO) 5-325 mg per tablet 1 Tab Oral Q4H PRN   metoprolol succinate (TOPROL-XL) 24 hr extended release tablet 50 mg Oral Daily   montelukast (SINGULAIR) 10 mg tablet 10 mg Oral QPM   NS flush syringe 2 mL  Intracatheter Q8HRS   And      NS flush syringe 2-6 mL Intracatheter Q1 MIN PRN   NS premix infusion  Intravenous Continuous   ondansetron (ZOFRAN) tablet 8 mg Oral Q8H PRN   oxyCODONE (ROXICODONE) immediate release tablet 5 mg Oral Q4H PRN   pantoprazole (PROTONIX) delayed release tablet 20 mg Oral Daily before Breakfast   prochlorperazine (COMPAZINE) tablet 10 mg Oral Q6H PRN   promethazine (PHENERGAN) tablet 25 mg Oral Q6H PRN   sodium hypochlorite (DAKINS) 0.125% (quarter strength) irrigation for wound vac  Irrigation Continuous       I/O:  I/O last 24 hours:      Intake/Output Summary (Last 24 hours) at 11/26/2017 1152  Last data filed at 11/26/2017 1125  Gross per 24 hour   Intake 3135 ml   Output 375 ml   Net 2760 ml     I/O current shift:  12/16 0700 - 12/16 1859  In: 550 [P.O.:400; I.V.:150]  Out: -     Prophylaxis:  Date Started Date Completed   DVT/PE  Enoxaparin and SCDs/ Venodynes/Impulse boots     GI: Proton Pump inhibitor       Nutrition/Residuals:  DIET REGULAR  DIETARY ORAL SUPPLEMENTS Oral Supplements  with tray: Ensure High Protein-Chocolate; BREAKFAST/LUNCH/DINNER; 1 Can  ROOM SERVICE:  NEEDS VISIT FOR MENU CHOICES    Labs  (Please indicate ordered or reviewed)  Reviewed: Lab Results for Last 24 Hours:    Results for orders placed or performed during the hospital encounter of 11/22/17 (from the past 24 hour(s))   BASIC METABOLIC PANEL   Result Value Ref Range    SODIUM 132 (L) 136 - 145 mmol/L    POTASSIUM 4.2 3.5 - 5.1 mmol/L    CHLORIDE 101 96 - 111 mmol/L    CO2 TOTAL 27 22 - 32 mmol/L    ANION GAP 4 4 - 13 mmol/L    CALCIUM 7.6 (L) 8.5 - 10.2 mg/dL    GLUCOSE 197 (H) 65 - 139 mg/dL    BUN 10 8 - 25 mg/dL    CREATININE 0.58 0.49 - 1.10 mg/dL    BUN/CREA RATIO 17 6 - 22    ESTIMATED GFR >59 >59 mL/min/1.9m^2   CBC   Result Value Ref Range    WBC 5.3 3.5 - 11.0 x10^3/uL    RBC 2.96 (L) 3.63 - 4.92 x10^6/uL    HGB 8.4 (L) 11.2 - 15.2 g/dL    HCT 25.2 (L) 33.5 - 45.2 %    MCV 85.1 78.0 - 100.0 fL     MCH 28.5 27.4 - 33.0 pg    MCHC 33.4 32.5 - 35.8 g/dL    RDW 15.6 (H) 12.0 - 15.0 %    PLATELETS 270 140 - 450 x10^3/uL    MPV 8.4 7.5 - 11.5 fL   HEPATIC FUNCTION PANEL   Result Value Ref Range    ALBUMIN 1.6 (L) 3.4 - 4.8 g/dL    ALKALINE PHOSPHATASE 88 <150 U/L    ALT (SGPT) 6 <55 U/L    AST (SGOT) 11 8 - 41 U/L    BILIRUBIN TOTAL 0.2 (L) 0.3 - 1.3 mg/dL    BILIRUBIN DIRECT 0.1 <0.3 mg/dL    PROTEIN TOTAL 4.0 (L) 6.0 - 8.0 g/dL   MAGNESIUM   Result Value Ref Range    MAGNESIUM 1.6 1.6 - 2.5 mg/dL   PHOSPHORUS   Result Value Ref Range    PHOSPHORUS 2.2 (L) 2.3 - 4.0 mg/dL       Radiology Tests (Please indicate ordered or reviewed)     Assessment/ Plan:   Active Hospital Problems   (*Primary Problem)    Diagnosis   . Hypokalemia   . Open abdominal wall wound       [name] is a 71 y.o. female status post robotic Whipple on October 25, 2017 presenting with pain and erythema and drainage from her left lower quadrant and poor p.o. intake and mobility at home.    -protein calorie malnutrition   -hypoalbuminemia   -high protein supplements   -good PO intake   -cont calorie count  -LLQ extraction site wound   -cont irrigating wound vac   -change to regular wound vac tomorrow  -plan discharge to SNF early this week    Leida Lauth, MD  11/26/2017, 11:52      I saw and examined the patient.  I reviewed the resident's note.  I agree with the findings and plan of care as documented in the resident's note.     Doing much better.  Wound VAC has improved control of the wound drainage.  Tolerating p.o..  Continue calorie counts.  Likely discharge to SNF this week.    Louanna Raw  Cyndi Bender, MD

## 2017-11-26 NOTE — Nurses Notes (Signed)
Stat nurses able to obtain IV access, recommending PICC or Midline. Paged surg oncology team-Qaisar, Dossie Der, waiting call back.  Creig Hines, RN  11/26/2017, 10:54

## 2017-11-26 NOTE — Care Plan (Signed)
Patient denial any Chest Pain, shortness of breath. LLQ abdomen pain controlled today with prn pain medication. Continue to monitor pain level.  Wound vac in place to LLQ per order.  Assessment per EPIC. Fall and skin precaution maintain. Frequent reposition encouraged. Sitter select activated and audible.  Patient ambulated in room with assist of X 1 with walker with steady gait.  V/S stable. Patient sat up on the side of the bed today for meals. Instructed to call for assists and ambulates. Call bell within reach.  Medication given per  [facility]. Plan of Care Reviewed with patient and sisters Plan to discharge tomorrow to SNF for rehab.  Patient preferres to be called Rod Holler.  Creig Hines, RN  11/26/2017, 17:30

## 2017-11-27 LAB — ALBUMIN: ALBUMIN: 1.5 g/dL — ABNORMAL LOW (ref 3.4–4.8)

## 2017-11-27 LAB — POC BLOOD GLUCOSE (RESULTS)
GLUCOSE, POC: 259 mg/dl — ABNORMAL HIGH (ref 70–105)
GLUCOSE, POC: 284 mg/dl — ABNORMAL HIGH (ref 70–105)
GLUCOSE, POC: 291 mg/dl — ABNORMAL HIGH (ref 70–105)

## 2017-11-27 LAB — PREALBUMIN: PREALBUMIN: 5.9 mg/dL — ABNORMAL LOW (ref 18.0–45.0)

## 2017-11-27 MED ORDER — SODIUM CHLORIDE 0.9 % INTRAVENOUS SOLUTION
INTRAVENOUS | Status: DC
Start: 2017-11-27 — End: 2017-11-30

## 2017-11-27 MED ORDER — INSULIN REGULAR HUMAN 100 UNIT/ML INJECTION SSIP
2.00 [IU] | INJECTION | Freq: Four times a day (QID) | SUBCUTANEOUS | Status: DC | PRN
Start: 2017-11-27 — End: 2017-11-30
  Administered 2017-11-27 (×3): 6 [IU] via SUBCUTANEOUS
  Administered 2017-11-28 (×2): 4 [IU] via SUBCUTANEOUS
  Administered 2017-11-28 – 2017-11-29 (×3): 6 [IU] via SUBCUTANEOUS
  Administered 2017-11-30: 4 [IU] via SUBCUTANEOUS
  Filled 2017-11-27: qty 3

## 2017-11-27 MED ORDER — SODIUM HYPOCHLORITE 0.125 % SOLUTION
Freq: Three times a day (TID) | Status: DC
Start: 2017-11-27 — End: 2017-11-30
  Administered 2017-11-29: 0
  Filled 2017-11-27: qty 473

## 2017-11-27 MED ADMIN — sodium chloride 0.9 % intravenous solution: @ 13:00:00 | NDC 00338004904

## 2017-11-27 MED ADMIN — sodium chloride 0.9 % intravenous solution: SUBCUTANEOUS | @ 18:00:00

## 2017-11-27 MED ADMIN — carvediloL 12.5 mg tablet: ORAL | @ 22:00:00

## 2017-11-27 NOTE — Nurses Notes (Signed)
Patient assessment and VS per flowsheet. Patient denies any n/v/d or pain at this time. Notified service that patient wound vac continually alarming for negative pressure blockage. Service to see pt. Will monitor.

## 2017-11-27 NOTE — Progress Notes (Signed)
[facility]  Surgery Progress Note    [name], [name], 71 y.o. female  Date of Birth:  10-Jan-1946  Date of Admission:  11/22/2017  Date of service: 11/27/2017    Chief Complaint: NAEO. No complaints. Tolerating diet and appears to be eating well.  Caloria count over the weekend shows good intake.  No abdominal pain. +BM/flatus.    Vital Signs:  Temp (24hrs) Max:37 C (94.4 F)      Systolic (96PRF), FMB:846 , Min:135 , KZL:935     Diastolic (70VXB), LTJ:03, Min:62, Max:77    Temp  Avg: 36.8 C (98.2 F)  Min: 36.5 C (97.7 F)  Max: 37 C (98.6 F)  Pulse  Avg: 75.2  Min: 58  Max: 87  Resp  Avg: 16.3  Min: 16  Max: 18  SpO2  Avg: 95.7 %  Min: 94 %  Max: 98 %  Pain Score (Numeric, Faces): 0    Today's Physical Exam:  Temperature: 36.7 C (98.1 F)  Heart Rate: 85  BP (Non-Invasive): (!) 152/62(rn notified)  Respiratory Rate: 16  SpO2-1: 96 %  Pain Score (Numeric, Faces): 0  General:  NAD  Lungs: Nonlabored breathing, Normal respiratory effort  Cardiovascular:  Normal rate  Abdomen:  Soft, nontender, nondistended, minimally TTP LLQ, vac in LLQ was removed.  Foul odor to wound. Fibrinous material removed from the wound bed.  Wound packed with saline soaked gauze.    Extremities: No cyanosis or edema.  Skin:  Skin warm and dry.  Neurologic:  Alert.  CNs grossly intact.      Current Medications:    Current Facility-Administered Medications:  atorvastatin (LIPITOR) tablet 10 mg Oral QPM   calcium carbonate (TUMS) chewable tablet 500 mg Oral 3x/day   cetirizine (ZYRTEC) tablet 10 mg Oral Daily   citalopram (CELEXA) tablet 20 mg Oral Daily   enoxaparin PF (LOVENOX) 40 mg/0.4 mL SubQ injection 40 mg Subcutaneous Q24H   gabapentin (NEURONTIN) capsule 300 mg Oral Daily   HYDROcodone-acetaminophen (NORCO) 10-325 mg per tablet 1 Tab Oral Q4H PRN   HYDROcodone-acetaminophen (NORCO) 5-325 mg per tablet 1 Tab Oral Q4H PRN   metoprolol succinate (TOPROL-XL) 24 hr extended release tablet 50 mg Oral Daily   montelukast  (SINGULAIR) 10 mg tablet 10 mg Oral QPM   NS flush syringe 2 mL Intracatheter Q8HRS   And      NS flush syringe 2-6 mL Intracatheter Q1 MIN PRN   NS premix infusion  Intravenous Continuous   ondansetron (ZOFRAN) tablet 8 mg Oral Q8H PRN   oxyCODONE (ROXICODONE) immediate release tablet 5 mg Oral Q4H PRN   pantoprazole (PROTONIX) delayed release tablet 20 mg Oral Daily before Breakfast   prochlorperazine (COMPAZINE) tablet 10 mg Oral Q6H PRN   promethazine (PHENERGAN) tablet 25 mg Oral Q6H PRN   sodium hypochlorite (DAKINS) 0.125% (quarter strength) irrigation  Irrigation 3x/day   SSIP insulin R human (HUMULIN R) 100 units/mL injection 2-6 Units Subcutaneous 4x/day PRN       I/O:  I/O last 24 hours:      Intake/Output Summary (Last 24 hours) at 11/27/2017 1206  Last data filed at 11/27/2017 1056  Gross per 24 hour   Intake 1000 ml   Output 250 ml   Net 750 ml     I/O current shift:  12/17 0700 - 12/17 1859  In: 45 [P.O.:640]  Out: -     Prophylaxis:  Date Started Date Completed   DVT/PE  Enoxaparin and SCDs/ Venodynes/Impulse boots  GI: Proton Pump inhibitor       Nutrition/Residuals:  DIET REGULAR  DIETARY ORAL SUPPLEMENTS Oral Supplements with tray: Ensure High Protein-Chocolate; BREAKFAST/LUNCH/DINNER; 1 Can  ROOM SERVICE:  NEEDS VISIT FOR MENU CHOICES    Labs  (Please indicate ordered or reviewed)  Reviewed: Lab Results for Last 24 Hours:    Results for orders placed or performed during the hospital encounter of 11/22/17 (from the past 24 hour(s))   ALBUMIN   Result Value Ref Range    ALBUMIN 1.5 (L) 3.4 - 4.8 g/dL   PREALBUMIN   Result Value Ref Range    PREALBUMIN 5.9 (L) 18.0 - 45.0 mg/dL   POC BLOOD GLUCOSE (RESULTS)   Result Value Ref Range    GLUCOSE, POC 284 (H) 70 - 105 mg/dl       Radiology Tests (Please indicate ordered or reviewed)     Assessment/ Plan:   Active Hospital Problems   (*Primary Problem)    Diagnosis   . Hypokalemia   . Open abdominal wall wound       [name] is a 70 y.o.  female status post robotic Whipple on October 25, 2017 presenting with pain and erythema and drainage from her left lower quadrant and poor p.o. intake and mobility at home.    -protein calorie malnutrition   -hypoalbuminemia   -high protein supplements   -good PO intake   -calorie count completed  -LLQ extraction site wound   -D/c irrigating wound vac   -change to wet-to-dry soaked with Dakins today.    - will plan to replace wound vac at time of discharge  -plan discharge to SNF early this week.  Need PT/OT recs today.      [name] Solomons Nock, PA-C  11/27/2017, 12:09    I personally saw and examined the patient. See physician's assistant note for additional details.    Continues to do well with oral intake.   wound VAC change today.  Foul smell and persistent fibrinous exudate.    Debrided at bedside.  Change to t.i.d. Dakin's wet to dry until discharge.    Lynwood Dawley, MD

## 2017-11-27 NOTE — Care Plan (Signed)
VS stable. Patient has not complained of any pain for me. Plan of care reviewed with patient.  Fall and skin precautions maintained. Will continue to monitor.

## 2017-11-27 NOTE — Nurses Notes (Signed)
Patient is A&O x4. VS stable. Fall precautions maintained. Patient denies any pain at this time. Patient negative for n/v and diarrhea. Assessment per Flowsheet. Patient has no other complaints at this time. Will continue to monitor.

## 2017-11-27 NOTE — Care Plan (Signed)
Medical Nutrition Therapy Follow Up      SUBJECTIVE :  Patient reports she's doing well and eating well, insisted she's drinking her supplements and per flow sheet documentation eating 75% of most meals.   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 female status post robotic Whipple on October 25, 2017 presenting with pain and erythema and drainage from her left lower quadrant and poor p.o. intake and mobility at home.    Calorie count completed over weekend and pt was eating at least 50% of meals and at least 2 ensure were documented, however calorie count sheet misplaced    Recommend :   Continue current diet and chocolate ensure TID  Patient for possible discharge tomorrow.   Monitor weekly weights if not discharged  Will follow.     Nutrition Diagnosis: Inadequate energy intake  (possibly) related to recent surgery, hosptialization as evidenced by need for nutrient dense, supplements, calorie count: resolved    Dierdre Searles, RD, LD, CNSC 11/27/2017, 14:05  Pager [phone]

## 2017-11-27 NOTE — Care Management Notes (Signed)
[facility] Management Note    Patient Name: [name]  Date of Birth: Dec 17, 1945  Sex: female  Date/Time of Admission: 11/22/2017  3:23 PM  Room/Bed: 931/B  Payor: MEDICARE / Plan: MEDICARE PART A AND B / Product Type: Medicare /    LOS: 5 days   PCP: No Pcp    Admitting Diagnosis:  Open abdominal wall wound [S31.109A]    Assessment:      11/27/17 1625   Assessment Details   Assessment Type Continued Assessment   Date of Care Management Update 11/27/17   Date of Next DCP Update 11/29/17   Care Management Plan   Discharge Planning Status plan in progress   Projected Discharge Date 11/30/17   Discharge Needs Assessment   Equipment Needed After Discharge wound care supplies   Discharge Facility/Level of Care Needs Swing Bed-Hospital Based (code 70)   Transportation Available ambulance       Discharge Plan:  Swing Bed-Hospital Based (code 5)  Per TBR patient will likely be dc ready in a few days, I spoke with Ebony Hail at [facility] swing bed unit and she is reviewing the patient for placement. She was unsure if there was a bed available. She asked that I follow up with her in the morning. The patient  Had bedside debridement today and will have a wound vac placed prior to dc. Will continue to follow.     The patient will continue to be evaluated for developing discharge needs.     Case Manager: Lieutenant Diego, RN  Phone: [phone]

## 2017-11-28 LAB — POC BLOOD GLUCOSE (RESULTS)
GLUCOSE, POC: 88 mg/dl (ref 70–105)
GLUCOSE, POC: 245 mg/dl — ABNORMAL HIGH (ref 70–105)
GLUCOSE, POC: 340 mg/dl — ABNORMAL HIGH (ref 70–105)
GLUCOSE, POC: 205 mg/dl — ABNORMAL HIGH (ref 70–105)

## 2017-11-28 LAB — CBC
HCT: 27.3 % — ABNORMAL LOW (ref 33.5–45.2)
HCT: 27.3 % — ABNORMAL LOW (ref 33.5–45.2)
HGB: 9.2 g/dL — ABNORMAL LOW (ref 11.2–15.2)
MCH: 28.5 pg (ref 27.4–33.0)
MCHC: 33.7 g/dL (ref 32.5–35.8)
MCV: 84.6 fL (ref 78.0–100.0)
MPV: 8.4 fL (ref 7.5–11.5)
PLATELETS: 298 10*3/uL (ref 140–450)
RBC: 3.23 x10ˆ6/uL — ABNORMAL LOW (ref 3.63–4.92)
RDW: 15.7 % — ABNORMAL HIGH (ref 12.0–15.0)
WBC: 5.2 10*3/uL (ref 3.5–11.0)

## 2017-11-28 LAB — BASIC METABOLIC PANEL
ANION GAP: 8 mmol/L (ref 4–13)
BUN/CREA RATIO: 16 (ref 6–22)
BUN: 9 mg/dL (ref 8–25)
CALCIUM: 8.1 mg/dL — ABNORMAL LOW (ref 8.5–10.2)
CHLORIDE: 103 mmol/L (ref 96–111)
CO2 TOTAL: 25 mmol/L (ref 22–32)
CREATININE: 0.56 mg/dL (ref 0.49–1.10)
ESTIMATED GFR: >59 mL/min/1.73mˆ2 (ref >59)
GLUCOSE: 93 mg/dL (ref 65–139)
POTASSIUM: 3.6 mmol/L (ref 3.5–5.1)
SODIUM: 136 mmol/L (ref 136–145)

## 2017-11-28 LAB — PHOSPHORUS: PHOSPHORUS: 3 mg/dL (ref 2.3–4.0)

## 2017-11-28 LAB — MAGNESIUM: MAGNESIUM: 1.3 mg/dL — ABNORMAL LOW (ref 1.6–2.5)

## 2017-11-28 MED ORDER — MAGNESIUM SULFATE 2 GRAM/50 ML (4 %) IN WATER INTRAVENOUS PIGGYBACK
2.0000 g | INJECTION | Freq: Once | INTRAVENOUS | Status: AC
Start: 2017-11-28 — End: 2017-11-28
  Administered 2017-11-28: 0 g via INTRAVENOUS
  Administered 2017-11-28: 2 g via INTRAVENOUS
  Filled 2017-11-28: qty 50

## 2017-11-28 MED ADMIN — sodium chloride 0.9 % (flush) injection syringe: @ 15:00:00

## 2017-11-28 MED ADMIN — cetirizine 10 mg tablet: ORAL | @ 08:00:00

## 2017-11-28 MED ADMIN — lactated Ringers intravenous solution: @ 08:00:00 | NDC 00264775000

## 2017-11-28 MED ADMIN — sodium chloride 0.9 % intravenous solution: ORAL | @ 11:00:00 | NDC 00338004904

## 2017-11-28 MED ADMIN — miconazole nitrate 2 % topical ointment: ORAL | @ 21:00:00 | NDC 43553000302

## 2017-11-28 NOTE — Progress Notes (Signed)
[facility]  Surgery Progress Note    [name], [name], 71 y.o. female  Date of Birth:  Jun 05, 1946  Date of Admission:  11/22/2017  Date of service: 11/28/2017    Chief Complaint: NAEO. No complaints. Tolerating diet and appears to be eating well.  LLQ wound was debrided at the bedside yesterday.  Wet-to-dry dressing applied.      Vital Signs:  Temp (24hrs) Max:37.1 C (19.4 F)      Systolic (17EYC), XKG:818 , Min:146 , HUD:149     Diastolic (70YOV), ZCH:88, Min:54, Max:80    Temp  Avg: 36.8 C (98.2 F)  Min: 36.6 C (97.9 F)  Max: 37.1 C (98.8 F)  Pulse  Avg: 84.3  Min: 80  Max: 89  Resp  Avg: 16.5  Min: 16  Max: 18  SpO2  Avg: 96 %  Min: 95 %  Max: 97 %  Pain Score (Numeric, Faces): 0    Today's Physical Exam:  Temperature: 36.6 C (97.9 F)  Heart Rate: 89  BP (Non-Invasive): (!) 174/54(RN notified )  Respiratory Rate: 16  SpO2-1: 97 %  Pain Score (Numeric, Faces): 0  General:  NAD  Lungs: Nonlabored breathing, Normal respiratory effort  Cardiovascular:  Normal rate  Abdomen:  Soft, nontender, nondistended, minimally TTP LLQ,   Wound packed with dakin's soaked gauze.    Extremities: No cyanosis or edema.  Skin:  Skin warm and dry.  Neurologic:  Alert.  CNs grossly intact.      Current Medications:    Current Facility-Administered Medications:  atorvastatin (LIPITOR) tablet 10 mg Oral QPM   calcium carbonate (TUMS) chewable tablet 500 mg Oral 3x/day   cetirizine (ZYRTEC) tablet 10 mg Oral Daily   citalopram (CELEXA) tablet 20 mg Oral Daily   enoxaparin PF (LOVENOX) 40 mg/0.4 mL SubQ injection 40 mg Subcutaneous Q24H   gabapentin (NEURONTIN) capsule 300 mg Oral Daily   HYDROcodone-acetaminophen (NORCO) 10-325 mg per tablet 1 Tab Oral Q4H PRN   HYDROcodone-acetaminophen (NORCO) 5-325 mg per tablet 1 Tab Oral Q4H PRN   metoprolol succinate (TOPROL-XL) 24 hr extended release tablet 50 mg Oral Daily   montelukast (SINGULAIR) 10 mg tablet 10 mg Oral QPM   NS flush syringe 2 mL Intracatheter Q8HRS   And       NS flush syringe 2-6 mL Intracatheter Q1 MIN PRN   NS premix infusion  Intravenous Continuous   ondansetron (ZOFRAN) tablet 8 mg Oral Q8H PRN   oxyCODONE (ROXICODONE) immediate release tablet 5 mg Oral Q4H PRN   pantoprazole (PROTONIX) delayed release tablet 20 mg Oral Daily before Breakfast   prochlorperazine (COMPAZINE) tablet 10 mg Oral Q6H PRN   promethazine (PHENERGAN) tablet 25 mg Oral Q6H PRN   sodium hypochlorite (DAKINS) 0.125% (quarter strength) irrigation  Irrigation 3x/day   SSIP insulin R human (HUMULIN R) 100 units/mL injection 2-6 Units Subcutaneous 4x/day PRN       I/O:  I/O last 24 hours:      Intake/Output Summary (Last 24 hours) at 11/28/2017 0805  Last data filed at 11/28/2017 0700  Gross per 24 hour   Intake 1930 ml   Output -   Net 1930 ml     I/O current shift:  12/18 0700 - 12/18 1859  In: 75 [I.V.:75]  Out: -     Prophylaxis:  Date Started Date Completed   DVT/PE  Enoxaparin and SCDs/ Venodynes/Impulse boots     GI: Proton Pump inhibitor       Nutrition/Residuals:  DIET REGULAR  DIETARY ORAL SUPPLEMENTS Oral Supplements with tray: Ensure High Protein-Chocolate; BREAKFAST/LUNCH/DINNER; 1 Can  ROOM SERVICE:  NEEDS VISIT FOR MENU CHOICES    Labs  (Please indicate ordered or reviewed)  Reviewed: Lab Results for Last 24 Hours:    Results for orders placed or performed during the hospital encounter of 11/22/17 (from the past 24 hour(s))   POC BLOOD GLUCOSE (RESULTS)   Result Value Ref Range    GLUCOSE, POC 284 (H) 70 - 105 mg/dl   POC BLOOD GLUCOSE (RESULTS)   Result Value Ref Range    GLUCOSE, POC 291 (H) 70 - 105 mg/dl   POC BLOOD GLUCOSE (RESULTS)   Result Value Ref Range    GLUCOSE, POC 259 (H) 70 - 105 mg/dl   BASIC METABOLIC PANEL   Result Value Ref Range    SODIUM 136 136 - 145 mmol/L    POTASSIUM 3.6 3.5 - 5.1 mmol/L    CHLORIDE 103 96 - 111 mmol/L    CO2 TOTAL 25 22 - 32 mmol/L    ANION GAP 8 4 - 13 mmol/L    CALCIUM 8.1 (L) 8.5 - 10.2 mg/dL    GLUCOSE 93 65 - 139 mg/dL    BUN 9 8 - 25  mg/dL    CREATININE 0.56 0.49 - 1.10 mg/dL    BUN/CREA RATIO 16 6 - 22    ESTIMATED GFR >59 >59 mL/min/1.19m^2   CBC   Result Value Ref Range    WBC 5.2 3.5 - 11.0 x10^3/uL    RBC 3.23 (L) 3.63 - 4.92 x10^6/uL    HGB 9.2 (L) 11.2 - 15.2 g/dL    HCT 27.3 (L) 33.5 - 45.2 %    MCV 84.6 78.0 - 100.0 fL    MCH 28.5 27.4 - 33.0 pg    MCHC 33.7 32.5 - 35.8 g/dL    RDW 15.7 (H) 12.0 - 15.0 %    PLATELETS 298 140 - 450 x10^3/uL    MPV 8.4 7.5 - 11.5 fL   MAGNESIUM   Result Value Ref Range    MAGNESIUM 1.3 (L) 1.6 - 2.5 mg/dL   PHOSPHORUS   Result Value Ref Range    PHOSPHORUS 3.0 2.3 - 4.0 mg/dL   POC BLOOD GLUCOSE (RESULTS)   Result Value Ref Range    GLUCOSE, POC 88 70 - 105 mg/dl       Radiology Tests (Please indicate ordered or reviewed)     Assessment/ Plan:   Active Hospital Problems   (*Primary Problem)    Diagnosis   . Hypokalemia   . Open abdominal wall wound       [name] is a 71 y.o. female status post robotic Whipple on October 25, 2017 presenting with pain and erythema and drainage from her left lower quadrant and poor p.o. intake and mobility at home.    -protein calorie malnutrition   -hypoalbuminemia   -high protein supplements   -good PO intake   -calorie count completed - per dietary:  Calorie count completed over weekend and pt was eating at least 50% of meals and at least 2 ensure were documented, however calorie count sheet misplaced    -LLQ extraction site wound   -change to wet-to-dry soaked with Dakins today. TID dressing changes    - will plan to replace wound vac at time of discharge  -plan discharge to SNF early this week.  Need PT/OT recs today.      Idelle Jo, PA-C  11/27/2017, 12:09    I personally saw and examined the patient. See physician's assistant note for additional details.     Wound debrided again at bedside.  Continue wet to dry dressing  until [facility] placement discharge.  Awaiting PT evaluation for SNF placement.    Lynwood Dawley, MD

## 2017-11-28 NOTE — Nurses Notes (Signed)
Assessed pt as per flowsheet, pt is in bed and oriented.  Pt tolerating PO.  She walked the hallway with physical therapy.  Service changed her dressing today.

## 2017-11-28 NOTE — Care Plan (Signed)
[place]  Occupational Therapy Initial Evaluation    Patient Name: [name]  Date of Birth: 1946/12/04  Height: Height: 154.9 cm ('5\' 1"' )  Weight: Weight: 90.2 kg (198 lb 13.7 oz)  Room/Bed: 931/B  Payor: MEDICARE / Plan: MEDICARE PART A AND B / Product Type: Medicare /     Assessment:   Patient tolerated session fairly well. Has equipement needed at home. Would benefit from home health to promote tolerance and independence in ADLs.       Discharge Needs:   Equipment Recommendation: to be determined    Discharge Disposition: home with 24/7 assistance, home with home health    JUSTIFICATION OF DISCHARGE RECOMMENDATION   Based on current diagnosis, functional performance prior to admission, and current functional performance, this patient requires continued OT services in home with 24/7 assistance, home with home health  in order to achieve significant functional improvements.    Plan:   Current Intervention: ADL retraining, IADL retraining, balance training, bed mobility training, endurance training, transfer training, strengthening    To provide Occupational therapy services 1x/day, minimum of 2x/week, until discharge.       The risks/benefits of therapy have been discussed with the patient/caregiver and he/she is in agreement with the established plan of care.       Subjective & Objective        11/28/17 1051   Therapist Pager   OT Assigned/ Pager # [phone]   Rehab Session   Document Type evaluation   Total OT Minutes: 22   Patient Effort good   Symptoms Noted During/After Treatment fatigue   General Information   Pertinent History of Current Functional Problem Pt is a 71 yo femalestatus post robotic Whipple on October 25, 2017 presenting with pain and erythema and drainage from her left lower quadrant and poor p.o. intake and mobility at home.    Medical Lines PIV Line   Respiratory Status room air   Existing Precautions/Restrictions full code;fall precautions   Pre  Treatment Status   Pre Treatment Patient Status Patient supine in bed;Venodynes in place and activated;Sitter select activated;Call light within reach   Support Present Pre Treatment  None   Communication Pre Treatment  Nurse   Communication Pre Treatment Comment seen with PT   Mutuality/Individual Preferences   Individualized Care Needs promote self care   [place] first floor set up at home, assist available at all times   Functional Level Prior   Prior Functional Level Comment uses FWW at home, able to perform self care on her own.    Self-Care   Activity/Exercise/Self-Care Comment rollator, cane, hospital bed   Pain Assessment   Pre/Post Treatment Pain Comment abdominal discomfort not rated   Cognitive Assessment/Interventions   Behavior/Mood Observations behavior appropriate to situation, WNL/WFL   Orientation Status oriented x 4   Attention WNL/WFL   Follows Commands WFL   RUE Assessment   RUE Assessment WFL- Within Functional Limits   LUE Assessment   LUE Assessment WFL- Within Functional Limits   Mobility Assessment/Training   Mobility Comment supine to sit with CG assist, SOB within task. Sit to stand with CG assist, cues for FWW use required. Ambulatory with CG assist and FWW.    Gait Assessment/Treatment   Deviations  cadence decreased;increased trunk sway;weight-shifting ability decreased;step length decreased   Comment required one standing rest break   Distance in Feet 2x150'   ADL Assessment/Intervention   ADL Comments  Donned socks with setup assist. Stood at sink to brush teeth with standby assist and FWW support.    Balance Skill Training   Sitting Balance: Static good balance   Sitting, Dynamic (Balance) fair + balance   Sit-to-Stand Balance fair balance   Standing Balance: Static fair + balance   Standing Balance: Dynamic fair balance   Systems Impairment Contributing to Balance Disturbance musculoskeletal;neuromuscular   Identified Impairments Contributing  to Balance Disturbance pain   Post Treatment Status   Post Treatment Patient Status Patient sitting in bedside chair or w/c;Call light within reach;Geophysicist/field seismologist present   Financial trader Nurse   Care Plan Goals   OT Rehab Goals Occupational Therapy Goal;Occupational Therapy Goal 2;Occupational Therapy Goal 3;Occupational Therapy Goal 4   Occupational Therapy Goals   OT Goal, Time to Achieve by discharge   OT Goal, Activity Type mod I with full body ADLs   Occupational Therapy Goal 2   OT Goal, Time to Achieve by discharge   OT Goal, Activity Type mod I with functional transfers in ADL tasks.     Occupational Therapy Goal 3   OT Goal, Time to Achieve by discharge   OT Goal, Activity Type independent with EC/WS techniques in ADL tasks.    Occupational Therapy Goal 4   OT Goal, Time to Achieve by discharge   OT Goal, Activity Type demo good sitting/standing balance in ADL tasks.    Planned Therapy Interventions, OT Eval   Planned Therapy Interventions ADL retraining;IADL retraining;balance training;bed mobility training;endurance training;transfer training;strengthening   Occupational Therapy Clinical Impression   Functional Level at Time of Session Patient tolerated session fairly well. Has equipement needed at home. Would benefit from home health to promote tolerance and independence in ADLs.    Criteria for Skilled Therapeutic Interventions Met (OT) skilled treatment is necessary   Rehab Potential good, to achieve stated therapy goals   Therapy Frequency 1x/day;minimum of 2x/week   Predicted Duration of Therapy until discharge   Anticipated Equipment Needs at Discharge to be determined   Anticipated Discharge Disposition home with 24/7 assistance;home with home health   Evaluation Complexity Justification   Occupational Profile Review Expanded review   Performance Deficits 1-3 deficits   Clinical Decision Making Moderate analytic  complexity   Evaluation Complexity Moderate       Therapist:   Elaina Hoops, OT   Pager #: [phone]

## 2017-11-28 NOTE — Care Plan (Signed)
[facility]  Physical Therapy Initial Evaluation    Patient Name: [name]  Date of Birth: 10/24/1946  Height: Height: 154.9 cm (5\' 1" )  Weight: Weight: 90.2 kg (198 lb 13.7 oz)  Room/Bed: 931/B  Payor: MEDICARE / Plan: MEDICARE PART A AND B / Product Type: Medicare /     Assessment:      Pt tolerated PT eval well. Pt completed bed mobility and transfers. Pt ambulated 2 x 150' using FWW with CGA and one standing rest break. Pt presents with decreased strength, endurance, balance and mobility deficits. Pt would benefit from continued skilled PT in current setting followed by home with assist, FWW. Will continue to follow.     Discharge Needs:    Equipment Recommendation: none anticipated    Discharge Disposition: home with assist, home with home health    JUSTIFICATION OF DISCHARGE RECOMMENDATION   Based on current diagnosis, functional performance prior to admission, and current functional performance, this patient requires continued PT services in home with assist, home with home health in order to achieve significant functional improvements in these deficit areas: aerobic capacity/endurance, gait, locomotion, and balance, ergonomics and body mechanics, neuromuscular.        Plan:   Current Intervention: balance training, bed mobility training, gait training, strengthening, transfer training  To provide physical therapy services 1x/day, minimum of 2x/week  for duration of until discharge.    The risks/benefits of therapy have been discussed with the patient/caregiver and he/she is in agreement with the established plan of care.       Subjective & Objective        11/28/17 1050   Therapist Pager   PT Assigned/ Pager # Juliann Mule [phone]   Rehab Session   Document Type evaluation   Total PT Minutes: 22   Patient Effort good   Symptoms Noted During/After Treatment fatigue   General Information   Patient Profile Reviewed? yes   Pertinent History of Current Functional Problem Pt is  a 71 yo femalestatus post robotic Whipple on October 25, 2017 presenting with pain and erythema and drainage from her left lower quadrant and poor p.o. intake and mobility at home.    Medical Lines PIV Line   Respiratory Status room air   Existing Precautions/Restrictions full code;fall precautions   Mutuality/Individual Preferences   Individualized Care Needs OOB to chair, ambulate with FWW, assist x 1   Living Environment   Lives With sibling(s)  (younger sister and brother in law)   Living Arrangements house   Home Assessment: Stairs in [place] Accessibility ramps present at home   [facility] Pt has first floor set up and family is able to assist as needed   Functional Level Prior   Ambulation 1 - assistive equipment   Transferring 1 - assistive equipment   Prior Functional Level Comment Pt reports using FWW or cane at baseline   Self-Care   Current Activity Tolerance good   Equipment Currently Used at Home yes   Equipment Currently Used at Home walker, rolling;cane, straight;hospital bed  (rollator)   Pre Treatment Status   Pre Treatment Patient Status Patient supine in bed;Telephone within reach;Call light within reach;Sitter select activated;Nurse approved session   Support Present Pre Treatment  None   Communication Pre Treatment  Nurse   Communication Pre Treatment Comment Pt/RN agreeable to PT eval, seen alongside OT for professional assistance   Cognitive Assessment/Interventions   Behavior/Mood Observations alert;cooperative   Orientation  Status oriented x 4   Attention WNL/WFL   Follows Commands follows two step commands;repetition of directions required   Comment cues for safety and sequencing of FWW use   Vital Signs   Vitals Comment remained stable on RA   Pain Assessment   Pre/Post Treatment Pain Comment Pt reports abdominal pain, did not rate   RLE Assessment   RLE Assessment WFL for stated baseline   LLE Assessment   LLE Assessment WFL for stated baseline   Bed Mobility  Assessment/Treatment   Supine-Sit Independence contact guard assist   Safety Issues impaired trunk control for bed mobility   Impairments endurance   Comment Increased time and effort   Transfer Assessment/Treatment   Sit-Stand Independence contact guard assist;verbal cues required;nonverbal cues required (demo/gesture)   Stand-Sit Independence contact guard assist;verbal cues required;nonverbal cues required (demo/gesture)   Sit-Stand-Sit, Assist Device front wheeled walker   Transfer Safety Issues balance decreased during turns;sequencing ability decreased;weight-shifting ability decreased   Transfer Impairments balance impaired;endurance   Transfer Comment Cued for proper hand placement and sequencing transfers with FWW   Gait Assessment/Treatment   Independence  contact guard assist   Assistive Device  rolling walker   Distance in Feet 2 x 150'   Gait Speed moderate   Deviations  cadence decreased;increased trunk sway;weight-shifting ability decreased;step length decreased   Safety Issues  balance decreased during turns   Impairments  balance impaired;endurance   Comment CGA for safety, able to complete with one standing rest break   Balance Skill Training   Comment with FWW   Sitting Balance: Static good balance   Sitting, Dynamic (Balance) fair + balance   Sit-to-Stand Balance fair balance   Standing Balance: Static fair + balance   Standing Balance: Dynamic fair balance   Systems Impairment Contributing to Balance Disturbance musculoskeletal;neuromuscular   Identified Impairments Contributing to Balance Disturbance pain  (endurance)   Therapeutic Exercise/Activity   Comment Pt stood at sink for self care ~ 8 min with CGA for safety. Pt demonstrated no LOB during dynamic standing with single UE support using FWW   Post Treatment Status   Post Treatment Patient Status Patient sitting in bedside chair or w/c;Call light within reach;Telephone within reach;Sitter select activated   Support Present Post Treatment   Clinical assistant present   Financial trader Nurse   Plan of Care Review   Plan Of Care Reviewed With patient   Physical Therapy Clinical Impression   Assessment Pt tolerated PT eval well. Pt completed bed mobility and transfers. Pt ambulated 2 x 150' using FWW with CGA and one standing rest break. Pt presents with decreased strength, endurance, balance and mobility deficits. Pt would benefit from continued skilled PT in current setting followed by home with assist, FWW. Will continue to follow.    Criteria for Skilled Therapeutic yes;meets criteria;skilled treatment is necessary   Pathology/Pathophysiology Noted musculoskeletal;neuromuscular;cardiovascular   Impairments Found (describe specific impairments) aerobic capacity/endurance;gait, locomotion, and balance;ergonomics and body mechanics;neuromuscular   Functional Limitations in Following  self-care;home management   Disability: Inability to Perform community/leisure   Rehab Potential good, to achieve stated therapy goals   Therapy Frequency 1x/day;minimum of 2x/week   Predicted Duration of Therapy Intervention (days/wks) until discharge   Anticipated Equipment Needs at Discharge (PT) none anticipated   Anticipated Discharge Disposition home with assist;home with home health   Evaluation Complexity Justification   Patient History: Co-morbity/factors that Impact Plan of Care 1-2 that impact Plan of Care;One or more other medical co-morbidity;Complicated  prior level of function/decline in function   Examination Components 4 or more Exam elements addressed;Strength;Balance;Bed mobility;Transfers;Ambulation   Presentation Evolving: Symptoms, complaints, characteristics of condition changing &/or cognitive deficits present   Clinical Decision Making Moderate complexity   Evaluation Complexity Moderate complexity   Care Plan Goals   PT Rehab Goals Bed Mobility Goal;Gait Training Goal;Transfer Training Goal   Bed Mobility Goal   Bed Mobility Goal, Date  Established 11/28/17   Bed Mobility Goal, Time to Achieve by discharge   Bed Mobility Goal, Activity Type all bed mobility activities   Bed Mobility Goal, Independence Level independent   Bed Mobility Goal, Additional Goal bed flat   Gait Training  Goal, Distance to Achieve   Gait Training  Goal, Date Established 11/28/17   Gait Training  Goal, Time to Achieve by discharge   Gait Training  Goal, Independence Level modified independence   Gait Training  Goal, Assist Device least restricted assistive device   Gait Training  Goal, Distance to Achieve 500   Transfer Training Goal   Transfer Training Goal, Date Established 11/28/17   Transfer Training Goal, Time to Achieve by discharge   Transfer Training Goal, Activity Type all transfers   Transfer Training Goal, Independence Level modified independence   Transfer Training Goal, Assist Device least restrictrictive assistive device   Planned Therapy Interventions, PT Eval   Planned Therapy Interventions (PT) balance training;bed mobility training;gait training;strengthening;transfer training       Therapist:   Sherlyn Lees, PT   Pager #: [phone]

## 2017-11-29 ENCOUNTER — Encounter (INDEPENDENT_AMBULATORY_CARE_PROVIDER_SITE_OTHER): Payer: Self-pay | Admitting: SURGICAL ONCOLOGY

## 2017-11-29 LAB — BASIC METABOLIC PANEL
ANION GAP: 6 mmol/L (ref 4–13)
ANION GAP: 6 mmol/L (ref 4–13)
BUN/CREA RATIO: 16 (ref 6–22)
BUN: 10 mg/dL (ref 8–25)
CALCIUM: 8 mg/dL — ABNORMAL LOW (ref 8.5–10.2)
CHLORIDE: 102 mmol/L (ref 96–111)
CO2 TOTAL: 25 mmol/L (ref 22–32)
CREATININE: 0.61 mg/dL (ref 0.49–1.10)
ESTIMATED GFR: >59 mL/min/1.73mˆ2 (ref >59)
GLUCOSE: 119 mg/dL (ref 65–139)
POTASSIUM: 3.8 mmol/L (ref 3.5–5.1)
SODIUM: 133 mmol/L — ABNORMAL LOW (ref 136–145)

## 2017-11-29 LAB — CBC
HCT: 26 % — ABNORMAL LOW (ref 33.5–45.2)
HGB: 8.8 g/dL — ABNORMAL LOW (ref 11.2–15.2)
MCH: 29.1 pg (ref 27.4–33.0)
MCHC: 33.8 g/dL (ref 32.5–35.8)
MCV: 86.2 fL (ref 78.0–100.0)
MPV: 8.5 fL (ref 7.5–11.5)
PLATELETS: 288 x10ˆ3/uL (ref 140–450)
RBC: 3.02 x10ˆ6/uL — ABNORMAL LOW (ref 3.63–4.92)
RDW: 16.2 % — ABNORMAL HIGH (ref 12.0–15.0)
WBC: 4.5 x10ˆ3/uL (ref 3.5–11.0)

## 2017-11-29 LAB — POC BLOOD GLUCOSE (RESULTS)
GLUCOSE, POC: 113 mg/dl — ABNORMAL HIGH (ref 70–105)
GLUCOSE, POC: 144 mg/dl — ABNORMAL HIGH (ref 70–105)
GLUCOSE, POC: 247 mg/dl — ABNORMAL HIGH (ref 70–105)
GLUCOSE, POC: 266 mg/dl — ABNORMAL HIGH (ref 70–105)

## 2017-11-29 LAB — HEPATIC FUNCTION PANEL
ALBUMIN: 1.6 g/dL — ABNORMAL LOW (ref 3.4–4.8)
ALKALINE PHOSPHATASE: 99 U/L (ref <150)
ALKALINE PHOSPHATASE: 99 U/L (ref <150)
ALT (SGPT): 9 U/L (ref <55)
AST (SGOT): 17 U/L (ref 8–41)
BILIRUBIN DIRECT: 0.2 mg/dL (ref <0.3)
BILIRUBIN TOTAL: 0.4 mg/dL (ref 0.3–1.3)
PROTEIN TOTAL: 4.1 g/dL — ABNORMAL LOW (ref 6.0–8.0)

## 2017-11-29 LAB — MAGNESIUM: MAGNESIUM: 1.3 mg/dL — ABNORMAL LOW (ref 1.6–2.5)

## 2017-11-29 LAB — PHOSPHORUS: PHOSPHORUS: 3.3 mg/dL (ref 2.3–4.0)

## 2017-11-29 MED ADMIN — propofoL 10 mg/mL intravenous emulsion: ORAL | @ 14:00:00

## 2017-11-29 MED ADMIN — PIPERACILLIN-TAZOBACTAM CADD PUMP INTERMITTENT INF - PHARMACY ORDER: ORAL | @ 19:00:00

## 2017-11-29 MED ADMIN — sodium chloride 0.9 % intravenous solution: ORAL | @ 05:00:00 | NDC 09999927838

## 2017-11-29 NOTE — Care Management Notes (Signed)
[facility] Management Note    Patient Name: [name]  Date of Birth: 05/31/1946  Sex: female  Date/Time of Admission: 11/22/2017  3:23 PM  Room/Bed: 931/B  Payor: MEDICARE / Plan: MEDICARE PART A AND B / Product Type: Medicare /    LOS: 7 days   Primary Care Providers:  Pcp, No (General)    Admitting Diagnosis:  Open abdominal wall wound [S31.109A]    Assessment:      11/29/17 1554   Assessment Details   Assessment Type Continued Assessment   Date of Care Management Update 11/29/17   Date of Next DCP Update 12/01/17   Care Management Plan   Discharge Planning Status plan in progress   Projected Discharge Date 11/30/17   Discharge Needs Assessment   Discharge Facility/Level of Care Needs Swing Bed-Hospital Based (code 40)   Transportation Available ambulance       Discharge Plan:  Swing Bed-Hospital Based (code 39)  [facility] plan is for dc in the morning with ambulance transport at 11. Ambulance task sent to [facility]. Will continue to follow.     The patient will continue to be evaluated for developing discharge needs.     Case Manager: Lieutenant Diego, RN  Phone: [phone]

## 2017-11-29 NOTE — Progress Notes (Signed)
[facility]  Surgery Progress Note    [name], [name], 71 y.o. female  Date of Birth:  06-Aug-1946  Date of Admission:  11/22/2017  Date of service: 11/29/2017    Chief Complaint: NAEO. Patient is resting comfortably and has no complaints this morning.       Vital Signs:  Temp (24hrs) Max:36.9 C (67.8 F)      Systolic (93YBO), FBP:102 , Min:121 , HEN:277     Diastolic (82UMP), NTI:14, Min:60, Max:72    Temp  Avg: 36.8 C (98.3 F)  Min: 36.7 C (98.1 F)  Max: 36.9 C (98.4 F)  Pulse  Avg: 85.3  Min: 78  Max: 107  Resp  Avg: 16  Min: 16  Max: 16  SpO2  Avg: 96.8 %  Min: 95 %  Max: 98 %  Pain Score (Numeric, Faces): 0    Today's Physical Exam:  Temperature: 36.8 C (98.2 F)  Heart Rate: 82  BP (Non-Invasive): (!) 156/65(rn notified)  Respiratory Rate: 16  SpO2-1: 95 %  Pain Score (Numeric, Faces): 0  General:  NAD  Lungs: Nonlabored breathing, Normal respiratory effort  Cardiovascular:  Normal rate  Abdomen:  Soft, nontender, nondistended, minimally TTP LLQ,   Wound packed with dakin's soaked gauze.    Extremities: No cyanosis or edema.  Skin:  Skin warm and dry.  Neurologic:  Alert.  CNs grossly intact.      Current Medications:    Current Facility-Administered Medications:  atorvastatin (LIPITOR) tablet 10 mg Oral QPM   calcium carbonate (TUMS) chewable tablet 500 mg Oral 3x/day   cetirizine (ZYRTEC) tablet 10 mg Oral Daily   citalopram (CELEXA) tablet 20 mg Oral Daily   enoxaparin PF (LOVENOX) 40 mg/0.4 mL SubQ injection 40 mg Subcutaneous Q24H   gabapentin (NEURONTIN) capsule 300 mg Oral Daily   HYDROcodone-acetaminophen (NORCO) 10-325 mg per tablet 1 Tab Oral Q4H PRN   HYDROcodone-acetaminophen (NORCO) 5-325 mg per tablet 1 Tab Oral Q4H PRN   metoprolol succinate (TOPROL-XL) 24 hr extended release tablet 50 mg Oral Daily   montelukast (SINGULAIR) 10 mg tablet 10 mg Oral QPM   NS flush syringe 2 mL Intracatheter Q8HRS   And      NS flush syringe 2-6 mL Intracatheter Q1 MIN PRN   NS premix infusion   Intravenous Continuous   ondansetron (ZOFRAN) tablet 8 mg Oral Q8H PRN   oxyCODONE (ROXICODONE) immediate release tablet 5 mg Oral Q4H PRN   pantoprazole (PROTONIX) delayed release tablet 20 mg Oral Daily before Breakfast   prochlorperazine (COMPAZINE) tablet 10 mg Oral Q6H PRN   promethazine (PHENERGAN) tablet 25 mg Oral Q6H PRN   sodium hypochlorite (DAKINS) 0.125% (quarter strength) irrigation  Irrigation 3x/day   SSIP insulin R human (HUMULIN R) 100 units/mL injection 2-6 Units Subcutaneous 4x/day PRN       I/O:  I/O last 24 hours:      Intake/Output Summary (Last 24 hours) at 11/29/2017 0751  Last data filed at 11/29/2017 0737  Gross per 24 hour   Intake 1850 ml   Output -   Net 1850 ml     I/O current shift:  No intake/output data recorded.    Prophylaxis:  Date Started Date Completed   DVT/PE  Enoxaparin and SCDs/ Venodynes/Impulse boots     GI: Proton Pump inhibitor       Nutrition/Residuals:  DIET REGULAR  DIETARY ORAL SUPPLEMENTS Oral Supplements with tray: Ensure High Protein-Chocolate; BREAKFAST/LUNCH/DINNER; 1 Can  ROOM SERVICE:  NEEDS  VISIT FOR MENU CHOICES    Labs  (Please indicate ordered or reviewed)  Reviewed: Lab Results for Last 24 Hours:    Results for orders placed or performed during the hospital encounter of 11/22/17 (from the past 24 hour(s))   POC BLOOD GLUCOSE (RESULTS)   Result Value Ref Range    GLUCOSE, POC 245 (H) 70 - 105 mg/dl   POC BLOOD GLUCOSE (RESULTS)   Result Value Ref Range    GLUCOSE, POC 340 (H) 70 - 105 mg/dl   POC BLOOD GLUCOSE (RESULTS)   Result Value Ref Range    GLUCOSE, POC 205 (H) 70 - 105 mg/dl   BASIC METABOLIC PANEL   Result Value Ref Range    SODIUM 133 (L) 136 - 145 mmol/L    POTASSIUM 3.8 3.5 - 5.1 mmol/L    CHLORIDE 102 96 - 111 mmol/L    CO2 TOTAL 25 22 - 32 mmol/L    ANION GAP 6 4 - 13 mmol/L    CALCIUM 8.0 (L) 8.5 - 10.2 mg/dL    GLUCOSE 119 65 - 139 mg/dL    BUN 10 8 - 25 mg/dL    CREATININE 0.61 0.49 - 1.10 mg/dL    BUN/CREA RATIO 16 6 - 22    ESTIMATED  GFR >59 >59 mL/min/1.41m^2   CBC   Result Value Ref Range    WBC 4.5 3.5 - 11.0 x10^3/uL    RBC 3.02 (L) 3.63 - 4.92 x10^6/uL    HGB 8.8 (L) 11.2 - 15.2 g/dL    HCT 26.0 (L) 33.5 - 45.2 %    MCV 86.2 78.0 - 100.0 fL    MCH 29.1 27.4 - 33.0 pg    MCHC 33.8 32.5 - 35.8 g/dL    RDW 16.2 (H) 12.0 - 15.0 %    PLATELETS 288 140 - 450 x10^3/uL    MPV 8.5 7.5 - 11.5 fL   MAGNESIUM   Result Value Ref Range    MAGNESIUM 1.3 (L) 1.6 - 2.5 mg/dL   PHOSPHORUS   Result Value Ref Range    PHOSPHORUS 3.3 2.3 - 4.0 mg/dL   HEPATIC FUNCTION PANEL   Result Value Ref Range    ALBUMIN 1.6 (L) 3.4 - 4.8 g/dL    ALKALINE PHOSPHATASE 99 <150 U/L    ALT (SGPT) 9 <55 U/L    AST (SGOT) 17 8 - 41 U/L    BILIRUBIN TOTAL 0.4 0.3 - 1.3 mg/dL    BILIRUBIN DIRECT 0.2 <0.3 mg/dL    PROTEIN TOTAL 4.1 (L) 6.0 - 8.0 g/dL   POC BLOOD GLUCOSE (RESULTS)   Result Value Ref Range    GLUCOSE, POC 113 (H) 70 - 105 mg/dl       Radiology Tests (Please indicate ordered or reviewed)     Assessment/ Plan:   Active Hospital Problems   (*Primary Problem)    Diagnosis   . Hypokalemia   . Open abdominal wall wound       [name] is a 71 y.o. female status post robotic Whipple on October 25, 2017 presenting with pain and erythema and drainage from her left lower quadrant and poor p.o. intake and mobility at home.    -protein calorie malnutrition   -hypoalbuminemia   -high protein supplements   -good PO intake   -calorie count completed - per dietary:  Calorie count completed over weekend and pt was eating at least 50% of meals and at least 2 ensure were documented, however calorie count  sheet misplaced    -LLQ extraction site wound   -change to wet-to-dry soaked with Dakins. TID dressing changes    - will plan to replace wound vac at time of discharge  -plan was to discharge to SNF.  Consult CM today to discuss options.        Idelle Jo, PA-C  11/29/2017, 07:53    I personally saw and examined the patient. See physician's assistant note for additional  details.     Continues to improve.  Dispo pending.    Lynwood Dawley, MD

## 2017-11-30 ENCOUNTER — Other Ambulatory Visit (INDEPENDENT_AMBULATORY_CARE_PROVIDER_SITE_OTHER): Payer: Self-pay | Admitting: Physician Assistant

## 2017-11-30 LAB — CBC
HCT: 25.9 % — ABNORMAL LOW (ref 33.5–45.2)
HGB: 8.8 g/dL — ABNORMAL LOW (ref 11.2–15.2)
MCH: 28.5 pg (ref 27.4–33.0)
MCHC: 33.9 g/dL (ref 32.5–35.8)
MCV: 84.1 fL (ref 78.0–100.0)
MPV: 8.6 fL (ref 7.5–11.5)
PLATELETS: 284 x10ˆ3/uL (ref 140–450)
RBC: 3.08 x10ˆ6/uL — ABNORMAL LOW (ref 3.63–4.92)
RDW: 15.9 % — ABNORMAL HIGH (ref 12.0–15.0)
WBC: 4.5 x10ˆ3/uL (ref 3.5–11.0)

## 2017-11-30 LAB — HEPATIC FUNCTION PANEL
ALBUMIN: 1.5 g/dL — ABNORMAL LOW (ref 3.4–4.8)
ALKALINE PHOSPHATASE: 98 U/L (ref <150)
ALT (SGPT): 8 U/L (ref <55)
AST (SGOT): 13 U/L (ref 8–41)
BILIRUBIN DIRECT: 0.2 mg/dL (ref <0.3)
BILIRUBIN TOTAL: 0.3 mg/dL (ref 0.3–1.3)
PROTEIN TOTAL: 4 g/dL — ABNORMAL LOW (ref 6.0–8.0)

## 2017-11-30 LAB — POC BLOOD GLUCOSE (RESULTS)
GLUCOSE, POC: 131 mg/dl — ABNORMAL HIGH (ref 70–105)
GLUCOSE, POC: 131 mg/dl — ABNORMAL HIGH (ref 70–105)
GLUCOSE, POC: 217 mg/dl — ABNORMAL HIGH (ref 70–105)

## 2017-11-30 LAB — MAGNESIUM: MAGNESIUM: 1 mg/dL — CL (ref 1.6–2.5)

## 2017-11-30 LAB — BASIC METABOLIC PANEL
ANION GAP: 5 mmol/L (ref 4–13)
ANION GAP: 5 mmol/L (ref 4–13)
BUN/CREA RATIO: 17 (ref 6–22)
BUN: 10 mg/dL (ref 8–25)
CALCIUM: 8 mg/dL — ABNORMAL LOW (ref 8.5–10.2)
CHLORIDE: 103 mmol/L (ref 96–111)
CO2 TOTAL: 25 mmol/L (ref 22–32)
CREATININE: 0.6 mg/dL (ref 0.49–1.10)
ESTIMATED GFR: >59 mL/min/1.73mˆ2 (ref >59)
GLUCOSE: 129 mg/dL (ref 65–139)
POTASSIUM: 3.8 mmol/L (ref 3.5–5.1)
SODIUM: 133 mmol/L — ABNORMAL LOW (ref 136–145)

## 2017-11-30 LAB — PHOSPHORUS: PHOSPHORUS: 3.7 mg/dL (ref 2.3–4.0)

## 2017-11-30 MED ORDER — MAGNESIUM 64 MG (MAGNESIUM CHLORIDE) TABLET,DELAYED RELEASE
64.0000 mg | DELAYED_RELEASE_TABLET | Freq: Once | ORAL | Status: AC
Start: 2017-11-30 — End: 2017-11-30
  Administered 2017-11-30: 64 mg via ORAL
  Filled 2017-11-30 (×2): qty 1

## 2017-11-30 MED ORDER — OXYCODONE 5 MG TABLET
5.00 mg | ORAL_TABLET | Freq: Four times a day (QID) | ORAL | 0 refills | Status: DC | PRN
Start: 2017-11-30 — End: 2018-04-03

## 2017-11-30 MED ADMIN — LORazepam 2 mg/mL injection solution: @ 14:00:00

## 2017-11-30 MED ADMIN — electrolyte-A intravenous solution: ORAL | @ 08:00:00 | NDC 00338022104

## 2017-11-30 NOTE — Nurses Notes (Signed)
Assessed and passed medications. Patient is A&O x 4. No report of pain at this time. Talked about plan for discharge today to outside facility by private vehicle. Magnesium was 1.0 this morning so paging doctors about replacement. High Fall and skin precautions maintained. Will continue to monitor.

## 2017-11-30 NOTE — Discharge Instructions (Signed)
Discharge Recommendations/ Plan:Discharge EZ:[id number] Bed-Hospital Based (code 2)      Resources:Please call report to [facility] swing bed unit [phone]

## 2017-11-30 NOTE — Care Management Notes (Signed)
[facility] Management Note    Patient Name: [name]  Date of Birth: [birth year]  Sex: female  Date/Time of Admission: 11/22/2017  3:23 PM  Room/Bed: 931/B  Payor: MEDICARE / Plan: MEDICARE PART A AND B / Product Type: Medicare /    LOS: 8 days   Primary Care Providers:  Pcp, No (General)    Admitting Diagnosis:  Open abdominal wall wound [S31.109A]    Assessment:      11/30/17 0903   Discharge Information   Discharge Disposition other (see comments)  (swing bed unit)   Lay Caregiver Notified of Discharge No lay caregiver appointed   3 Day Stay Verified: N/A   Discharge Date 11/30/17   Transport Type   Transport Mode Private Vehicle       Discharge Plan:  Swing Bed-Hospital Based (code 56)  Spoke with patient sister vis telephone. The family will be here around 10am to trasnport the patient to [facility]. Packet tasked to [employer] Report number placed in AVS. Service notified of plan. Will continue to follow.     The patient will continue to be evaluated for developing discharge needs.     Case Manager: Lieutenant Diego, RN  Phone: [phone]

## 2017-11-30 NOTE — Nurses Notes (Signed)
Patient alert and oriented. Assessment completed per flow sheet doc. Pain score is 0 out of 10 to the . PRN  given. .  precautions maintained. Sitter select is on. Call bell is within reach. Patient is pleasant and cooperative. No questions or concerns at this time.    0656 Critical Lab Value, Mg 1.0, service notified

## 2017-11-30 NOTE — Care Plan (Signed)
Patient educated on medications given. Patient pain assessed and treated as needed. Patient labs monitored. Fall precautions maintained. Patient educated to call not fall.  Patient denies any questions or concerns at this time.

## 2017-11-30 NOTE — Nurses Notes (Addendum)
Went over discharge paper work with patient and family. Answered any questions or concerns. Changed dressing and helped patient get dressed. Took over   IV. Patient left floor by wheel chair.    Report was called into SNF.    Patient left in private vehicle.

## 2017-11-30 NOTE — Discharge Summary (Signed)
[facility]  DISCHARGE SUMMARY    PATIENT NAME:  [name], [name]  MRN:  [id number]  DOB:  [birth year]    ENCOUNTER DATE:  11/22/2017  INPATIENT ADMISSION DATE: 11/22/2017  DISCHARGE DATE:  11/30/2017    ATTENDING PHYSICIAN: Nicola Police, MD  SERVICE: SURG ONCOLOGY  PRIMARY CARE PHYSICIAN: No Pcp       PRIMARY DISCHARGE DIAGNOSIS:   Active Hospital Problems    Diagnosis Date Noted   . Hypokalemia 11/23/2017   . Open abdominal wall wound 11/22/2017      Resolved Hospital Problems   No resolved problems to display.     Active Non-Hospital Problems    Diagnosis Date Noted   . Pancreatic cancer (CMS [place]) 10/25/2017   . Asthma 01/24/2017   . Type 2 diabetes mellitus (CMS [place]) 01/24/2017        DISCHARGE MEDICATIONS:     Current Discharge Medication List      CONTINUE these medications - NO CHANGES were made during your visit.      Details   aspirin 81 mg Tablet, Delayed Release (E.C.)  Commonly known as:  ECOTRIN   81 mg, Oral  Refills:  0     atorvastatin 10 mg Tablet  Commonly known as:  LIPITOR   10 mg, Oral  Refills:  0     cetirizine 10 mg Tablet  Commonly known as:  ZYRTEC   10 mg, Oral  Refills:  0     citalopram 20 mg Tablet  Commonly known as:  CELEXA   20 mg, Oral  Refills:  0     furosemide 20 mg Tablet  Commonly known as:  LASIX   20 mg, DAILY  Refills:  0     gabapentin 300 mg Capsule  Commonly known as:  NEURONTIN   300 mg, DAILY  Refills:  0     glipiZIDE 10 mg Tablet Extended Rel 24 hr (2)  Commonly known as:  GLUCOTROL XL   10 mg, Oral  Refills:  0     hydroCHLOROthiazide 25 mg Tablet  Commonly known as:  HYDRODIURIL   25 mg, DAILY  Refills:  0     HYDROcodone-acetaminophen 5-325 mg Tablet  Commonly known as:  NORCO   1 Tab, Oral, EVERY 4 HOURS PRN  Refills:  0     lidocaine-prilocaine 2.5-2.5 % Cream  Commonly known as:  EMLA   No dose, route, or frequency recorded.  Refills:  0     LORazepam 0.5 mg Tablet  Commonly known as:  ATIVAN   0.5 mg, DAILY  Refills:  0     losartan 100 mg  Tablet  Commonly known as:  COZAAR   100 mg, Oral, DAILY  Refills:  0     MetFORMIN 1,000 mg Tablet  Commonly known as:  GLUCOPHAGE   1,000 mg, 2 TIMES DAILY WITH FOOD  Refills:  0     metoprolol succinate 50 mg Tablet Sustained Release 24 hr  Commonly known as:  TOPROL-XL   50 mg, DAILY  Refills:  0     montelukast 10 mg Tablet  Commonly known as:  SINGULAIR   10 mg, Oral  Refills:  0     omeprazole 20 mg Capsule, Delayed Release(E.C.)  Commonly known as:  PRILOSEC   20 mg, DAILY  Refills:  0     potassium chloride 20 mEq Tab Sust.Rel. Particle/Crystal  Commonly known as:  K-DUR   20 mEq, Oral  Refills:  0     prochlorperazine 10 mg Tablet  Commonly known as:  COMPAZINE   10 mg, Oral  Refills:  0     promethazine 25 mg Tablet  Commonly known as:  PHENERGAN   25 mg, Oral  Refills:  0     ZOFRAN 8 mg Tablet  Generic drug:  ondansetron   8 mg, Oral, EVERY 8 HOURS PRN  Refills:  0          Discharge med list refreshed?  YES    ALLERGIES:  Allergies   Allergen Reactions   . Sulfa (Sulfonamides) Itching             HOSPITAL PROCEDURE(S):   Bedside Procedures:  No orders of the defined types were placed in this encounter.    Surgical     REASON FOR HOSPITALIZATION AND HOSPITAL COURSE     BRIEF HPI:  [name] is a 71 y.o. female with history of pancreatic cancer s/p robotic Whipple who presented on 11/22/2017 with increased pain, erythema and drainage from LLQ extraction site.  Dakins wet-to-dry dressings were done on the LLQ wound.    Irrigating wound vac placed on 11/24/17.  Poor nutrition also addressed, and pt encouraged to drink Ensure supplements with every meal.  By 11/26/17, the wound vac had improved control of the LLQ wound drainage. The wound vac was taken down, and the wound was dressed with 1/4 strength Dakin's wet-to-dry dressing changes TID in preparation for discharge.  It was planned that the SNF would resume wound care management upon discharge.    The patient was determined safe for discharge on  11/30/17.  Prior to discharge the patient was tolerating a diet, her pain was well controlled on oral pain medications, she felt safe for discharge to the nursing facility.  The nursing facility will continue wound care and should perform TID wet-to-dry dressing changes with 1/4 strength Dakin's solution.  They should also monitor her Magnesium levels and replace to 2.0.  The patient will follow up with Dr. Cyndi Bender in 2 weeks for reevaluation.  All questions were answered prior to discharge and the patient agreed to be discharged at this time. The patient was instructed to follow up sooner for new or concerning symptoms.            CONDITION ON DISCHARGE:  A. Ambulation: Full ambulation  B. Self-care Ability: With partial assistance  C. Cognitive Status Alert and Oriented x 3  D. Code status at discharge:   Code Status Information     Code Status    Full Code                 LINES/DRAINS/WOUNDS AT DISCHARGE:   Patient Lines/Drains/Airways Status    Active Line / Dialysis Catheter / Dialysis Graft / Drain / Airway / Wound     Name: Placement date: Placement time: Site: Days:    Peripheral IV Left;Upper Cephalic  (lateral side of arm)  11/27/17   1746   2    Wound Vac Left Abdomen  11/24/17   1400   5    Wound (Non-Surgical) Right Neck  10/30/17   0800   31    Wound (Non-Surgical) Anterior;Left Abdomen  11/10/17   1900   19                DISCHARGE DISPOSITION: SNF              DISCHARGE INSTRUCTIONS:    No discharge procedures  on file.       Juleen China, MD      Copies sent to Care Team       Relationship Specialty Notifications Start End    Pcp, No PCP - General   10/18/17           Referring providers can utilize [email or web address] to access their referred [facility] patient's information.

## 2017-11-30 NOTE — Care Management Notes (Signed)
Requested by [facility] to follow up with pt's family to check if they are able to transport pt to [facility] swing bed.  Left message with pt's sister [name].  Contacted pt's sister Enid Derry who states that pt's daughter-in-law Dwana Garin is planning to arrive by1000 to transport pt.  Enid Derry states she left message for [facility] this evening.  Notified Health Team of transport arrangements so they can cancel transport previously scheduled.  Will update day shift care manager.

## 2017-11-30 NOTE — Progress Notes (Signed)
Patient has a wound in the LLQ of the abdomen.  She will require wound vac placement to the wound.  She requires black foam.  Negative pressure setting -153mmHg.  Please change the vac every Monday/Wednesday/Friday.     If you have any questions, please contact our office at [phone].      [name], PA-C  11/30/2017, 14:23

## 2017-11-30 NOTE — Nurses Notes (Addendum)
Patients Magnesium was 1.0 this morning. I paged Surg Onc and talked to Juleen China about replacing before leaving with family to outside facility. They put an order in for PO 64 mg of Magnesium. Talked about concern of that not getting the level up to normal limits before discharge. Was told that if level is still low at facility that the outside facility will replace it as needed. Will continue to monitor.   Charge nurse paged service too about replacement concerns and oral medication being given rather than IV.

## 2017-11-30 NOTE — Progress Notes (Signed)
[facility]  Surgery Progress Note    [place], [name], 71 y.o. female  Date of Birth:  [birth year]  Date of Admission:  11/22/2017  Date of service: 11/30/2017    Chief Complaint: NAEO. She has been OOB to chair.  Denies abdominal pain, except when her wound is dressed.    Vital Signs:  Temp (24hrs) Max:36.9 C (68.1 F)      Systolic (15BWI), OMB:559 , Min:137 , RCB:638     Diastolic (45XMI), WOE:32, Min:60, Max:79    Temp  Avg: 36.8 C (98.2 F)  Min: 36.7 C (98.1 F)  Max: 36.9 C (98.4 F)  Pulse  Avg: 82.6  Min: 72  Max: 89  Resp  Avg: 16  Min: 16  Max: 16  SpO2  Avg: 96 %  Min: 93 %  Max: 98 %  Pain Score (Numeric, Faces): 0    Today's Physical Exam:  Temperature: 36.9 C (98.4 F)  Heart Rate: 87  BP (Non-Invasive): 137/79  Respiratory Rate: 16  SpO2-1: 98 %  Pain Score (Numeric, Faces): 0  General:  NAD  Lungs: Nonlabored breathing, Normal respiratory effort  Cardiovascular:  Normal rate  Abdomen:  Soft, nontender, nondistended, minimally TTP LLQ; wound covered with Mepilex  Extremities: No cyanosis or edema.  Skin:  Skin warm and dry.  Neurologic:  Alert.  CNs grossly intact.      Current Medications:    Current Facility-Administered Medications:  atorvastatin (LIPITOR) tablet 10 mg Oral QPM   calcium carbonate (TUMS) chewable tablet 500 mg Oral 3x/day   cetirizine (ZYRTEC) tablet 10 mg Oral Daily   citalopram (CELEXA) tablet 20 mg Oral Daily   enoxaparin PF (LOVENOX) 40 mg/0.4 mL SubQ injection 40 mg Subcutaneous Q24H   gabapentin (NEURONTIN) capsule 300 mg Oral Daily   HYDROcodone-acetaminophen (NORCO) 10-325 mg per tablet 1 Tab Oral Q4H PRN   HYDROcodone-acetaminophen (NORCO) 5-325 mg per tablet 1 Tab Oral Q4H PRN   magnesium chloride (SLOW-MAG) delayed release tablet 64 mg Oral Once   metoprolol succinate (TOPROL-XL) 24 hr extended release tablet 50 mg Oral Daily   montelukast (SINGULAIR) 10 mg tablet 10 mg Oral QPM   NS flush syringe 2 mL Intracatheter Q8HRS   And      NS flush syringe 2-6  mL Intracatheter Q1 MIN PRN   NS premix infusion  Intravenous Continuous   ondansetron (ZOFRAN) tablet 8 mg Oral Q8H PRN   oxyCODONE (ROXICODONE) immediate release tablet 5 mg Oral Q4H PRN   pantoprazole (PROTONIX) delayed release tablet 20 mg Oral Daily before Breakfast   prochlorperazine (COMPAZINE) tablet 10 mg Oral Q6H PRN   promethazine (PHENERGAN) tablet 25 mg Oral Q6H PRN   sodium hypochlorite (DAKINS) 0.125% (quarter strength) irrigation  Irrigation 3x/day   SSIP insulin R human (HUMULIN R) 100 units/mL injection 2-6 Units Subcutaneous 4x/day PRN       I/O:  I/O last 24 hours:      Intake/Output Summary (Last 24 hours) at 11/30/2017 1042  Last data filed at 11/30/2017 0934  Gross per 24 hour   Intake 2900 ml   Output -   Net 2900 ml     I/O current shift:  12/20 0700 - 12/20 1859  In: 480 [P.O.:480]  Out: -     Prophylaxis:  Date Started Date Completed   DVT/PE  Enoxaparin and SCDs/ Venodynes/Impulse boots     GI: Proton Pump inhibitor       Nutrition/Residuals:  DIET REGULAR  DIETARY ORAL  SUPPLEMENTS Oral Supplements with tray: Ensure High Protein-Chocolate; BREAKFAST/LUNCH/DINNER; 1 Can  ROOM SERVICE:  NEEDS VISIT FOR MENU CHOICES    Labs  (Please indicate ordered or reviewed)  Reviewed: Lab Results for Last 24 Hours:    Results for orders placed or performed during the hospital encounter of 11/22/17 (from the past 24 hour(s))   POC BLOOD GLUCOSE (RESULTS)   Result Value Ref Range    GLUCOSE, POC 144 (H) 70 - 105 mg/dl   POC BLOOD GLUCOSE (RESULTS)   Result Value Ref Range    GLUCOSE, POC 247 (H) 70 - 105 mg/dl   POC BLOOD GLUCOSE (RESULTS)   Result Value Ref Range    GLUCOSE, POC 266 (H) 70 - 105 mg/dl   BASIC METABOLIC PANEL   Result Value Ref Range    SODIUM 133 (L) 136 - 145 mmol/L    POTASSIUM 3.8 3.5 - 5.1 mmol/L    CHLORIDE 103 96 - 111 mmol/L    CO2 TOTAL 25 22 - 32 mmol/L    ANION GAP 5 4 - 13 mmol/L    CALCIUM 8.0 (L) 8.5 - 10.2 mg/dL    GLUCOSE 129 65 - 139 mg/dL    BUN 10 8 - 25 mg/dL     CREATININE 0.60 0.49 - 1.10 mg/dL    BUN/CREA RATIO 17 6 - 22    ESTIMATED GFR >59 >59 mL/min/1.67m^2   CBC   Result Value Ref Range    WBC 4.5 3.5 - 11.0 x10^3/uL    RBC 3.08 (L) 3.63 - 4.92 x10^6/uL    HGB 8.8 (L) 11.2 - 15.2 g/dL    HCT 25.9 (L) 33.5 - 45.2 %    MCV 84.1 78.0 - 100.0 fL    MCH 28.5 27.4 - 33.0 pg    MCHC 33.9 32.5 - 35.8 g/dL    RDW 15.9 (H) 12.0 - 15.0 %    PLATELETS 284 140 - 450 x10^3/uL    MPV 8.6 7.5 - 11.5 fL   HEPATIC FUNCTION PANEL   Result Value Ref Range    ALBUMIN 1.5 (L) 3.4 - 4.8 g/dL    ALKALINE PHOSPHATASE 98 <150 U/L    ALT (SGPT) 8 <55 U/L    AST (SGOT) 13 8 - 41 U/L    BILIRUBIN TOTAL 0.3 0.3 - 1.3 mg/dL    BILIRUBIN DIRECT 0.2 <0.3 mg/dL    PROTEIN TOTAL 4.0 (L) 6.0 - 8.0 g/dL   MAGNESIUM   Result Value Ref Range    MAGNESIUM 1.0 (LL) 1.6 - 2.5 mg/dL   PHOSPHORUS   Result Value Ref Range    PHOSPHORUS 3.7 2.3 - 4.0 mg/dL   POC BLOOD GLUCOSE (RESULTS)   Result Value Ref Range    GLUCOSE, POC 131 (H) 70 - 105 mg/dl       Radiology Tests (Please indicate ordered or reviewed)     Assessment/ Plan:   Active Hospital Problems   (*Primary Problem)    Diagnosis   . Hypokalemia   . Open abdominal wall wound       Arisbeth Purrington is a 71 y.o. female status post robotic Whipple on October 25, 2017 presenting with pain and erythema and drainage from her left lower quadrant and poor p.o. intake and mobility at home.    -protein calorie malnutrition   -hypoalbuminemia   -high protein supplements   -good PO intake   -calorie count completed - per dietary:  Calorie count completed over weekend and pt was  eating at least 50% of meals and at least 2 ensure were documented, however calorie count sheet misplaced    -LLQ extraction site wound   - change to wet-to-dry soaked with Dakins. TID dressing changes    - will plan to replace wound vac at time of discharge  -D/c to facility today.  Family to bring her. Wound vac will be placed at facility.  - Replete Magnesium prior to transfer       Juleen China, MD  11/30/2017, 10:44      I saw and examined the patient.  I reviewed the resident's note.  I agree with the findings and plan of care as documented in the resident's note.      Wound dramatically improved.  Tolerating PO intake.  Ambulating.     Lynwood Dawley, MD

## 2017-12-01 NOTE — Care Management Notes (Signed)
Referral Information  ++++++ Placed Provider #1 ++++++  Case Manager: Johnell Comings  Provider Type: Swing  Address:  ,    Contact:    Fax:   Fax:

## 2017-12-20 ENCOUNTER — Encounter (INDEPENDENT_AMBULATORY_CARE_PROVIDER_SITE_OTHER): Payer: Self-pay | Admitting: SURGICAL ONCOLOGY

## 2017-12-20 ENCOUNTER — Encounter (INDEPENDENT_AMBULATORY_CARE_PROVIDER_SITE_OTHER): Payer: Self-pay | Admitting: Social Worker

## 2017-12-20 ENCOUNTER — Ambulatory Visit: Payer: Medicare Other | Attending: SURGICAL ONCOLOGY | Admitting: SURGICAL ONCOLOGY

## 2017-12-20 VITALS — BP 120/70 | HR 90 | Temp 97.3°F | Ht 61.0 in | Wt 174.8 lb

## 2017-12-20 DIAGNOSIS — S31109A Unspecified open wound of abdominal wall, unspecified quadrant without penetration into peritoneal cavity, initial encounter: Principal | ICD-10-CM | POA: Insufficient documentation

## 2017-12-20 DIAGNOSIS — C259 Malignant neoplasm of pancreas, unspecified: Secondary | ICD-10-CM

## 2017-12-20 DIAGNOSIS — Z9889 Other specified postprocedural states: Secondary | ICD-10-CM

## 2017-12-20 NOTE — Progress Notes (Signed)
SURGICAL ONCOLOGY POST-OPERATIVE NOTE    HPI: Ms. 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  Given her treatment response she was taken to the operating room on 10/25/2017 for robotic Whipple procedure.   Her postoperative course was complicated by a chyle leak and poor PO intake, which resolved with bowel rest, TPN, and drainage.   Her wound also was opened and packed at the bedside.  she was then readmitted for poor p.o. intake and poor wound care.  A wound VAC was placed after serial debridements of the wound and her intake improved.  She was then discharged to a skilled nursing facility.    SUBJECTIVE:  At this time she is doing much better.     She has been discharged from a nursing facility as long as I approved.  She is eating well, long as she likes the food as she states.  She is walking 2 times a day and says the physical therapy focused our happy with her progress.  She denies fever, chills or other complaints.  She has a minor amount of pain around her wound VAC site    OBJECTIVE:  AF  VSS  A&O, NAD  RRR  No respiratory distress  Abdoment soft, nondistended, tender around LLQ incision.  Extraction site  with good granulation tissue and minor fibrinous exudate..   wound is considerably smaller than last visit.  Bilateral lower extremity edema      Pathology  Final Pathologic Diagnosis    A. GALLBLADDER, CHOLECYSTECTOMY:    - Cholesterolosis.    - Cholelithiasis.    - No malignancy identified.    B. PORTION OF PANCREAS, DUODENUM, AND DISTAL STOMACH, PANCREATICODUODENECTOMY:    - Well-differentiated ductal adenocarcinoma, involving the pancreatic head  (see Cancer Case Summary).    - Seven lymph nodes, negative for malignancy (0/7).    - Pancreatic intraepithelial neoplasia (PanIN 3).    - Chronic pancreatitis.    - Stomach and duodenum with no evidence of  malignancy.    C. FINAL BILE DUCT MARGIN, EXCISION:    - No malignancy identified.    CANCER CASE SUMMARY - CARCINOMA OF THE PANCREAS  Procedure: Pancreaticoduodenectomy, cholecystectomy, and additional bile duct  margin excision.  Tumor site: Pancreatic head.  Tumor size: 5.0 x 4.2 x 2.3 cm.   Histologic type: Ductal adenocarcinoma.  Histologic grade: G1 - well-differentiated.  Tumor extension: Tumor is confirmed to pancreas.   Margins: All margins are uninvolved by invasive carcinoma and high-grade  intraepithelial neoplasia.    Pancreatic neck margin: Uninvolved.    Uncinate (retroperitoneal/superior mesenteric artery) margin: Uninvolved  (2 mm from the closest margin, slide   B10).    Bile duct margin: Uninvolved.    Proximal margin: Uninvolved.    Distal margin: Uninvolved.    Vascular groove: Uninvolved.  Treatment effect: Absent.    Extensive residual cancer with no evident tumor regression (poor or no  response, score 3).  Lymphovascular invasion: Not identified.  Perineural invasion: Present (slide B9).  Regional lymph nodes:     Number of lymph nodes involved: 0.    Number of lymph nodes examined: 7.  Pathologic stage classification: ypT3, pN0.  Additional pathologic findings: Pancreatic intraepithelial neoplasia (PanIN 3),  chronic pancreatitis.       Comment  Selected slides from part B were reviewed with Dr. Lance Morin with concurrence.         lctr -  10/30/2017 Electronically Signed By: Les Pou, M.D.  10/30/2017 11:08     ASSESSMENT/PLAN:   72 year old female status post robotic assisted Whipple for pancreatic adenocarcinoma.    She has had a bumpy postoperative course but seems to be turning the corner.  She will be discharged back home.  She is taking adequate p.o. intake we hope to see her weight stabilized.  Her wound is slowly healing.  I recommend a wound VAC to continue since we are making progress and this will be the  easiest for her to care for at home.  We will coordinate for wound VAC and home care.  I will plan to see her back in 1 month or sooner if necessary.  At that time we will need to decide about referral back to her oncologist for adjuvant therapy.  If we have to wait much longer and may not be worthwhile to pursue additional adjuvant chemotherapy, however she has not been strong enough to tolerate it up to this point.     Lynwood Dawley, MD

## 2017-12-20 NOTE — Progress Notes (Signed)
MSW received call from Dr. Madaline Guthrie staff, [place], requesting wound vac for patient.  Per caller, patient will not be back in clinic until February, vac will need placed in home by home health agency.  MSW attempted to contact patient at numbers listed in chart to obtain [facility] agency choice, mobile number not in service, LVM on home phone number for return call at patient's earliest convenience.  MSW reviewed chart, learned that patient was discharged from [facility] to [facility].  MSW contacted [place] at [phone], was informed that patient was discharged to home today with [facility]). MSW requested and received discharge summary from [facility], scanned to media.  MSW contacted [facility] at [phone], spoke to Dominica, who confirmed patient on their service, and agreeable to placing wound vac when it arrives.  MSW initiated [facility], faxed Surg Onc staff to have clinical information completed and to obtain Dr. Madaline Guthrie signature.  Requested that form be faxed back to this MSW when complete, at which time will be faxed to [facility] with supporting documentation.  MSW will also notify [employer] rep, Benita Stabile, at time of form submission.  MSW will continue to follow as needed.    [name] was provided with information about the following community resources:  DME:  Wound Vac

## 2017-12-21 ENCOUNTER — Ambulatory Visit (INDEPENDENT_AMBULATORY_CARE_PROVIDER_SITE_OTHER): Payer: Self-pay | Admitting: SURGICAL ONCOLOGY

## 2017-12-21 NOTE — Telephone Encounter (Signed)
Daughter-in-law called in.  Concerned that wound might be getting infected.  Stated "It had a lot of drainage since yesterday when Dr. Cyndi Bender packed it.  Drainage is green in  color and has an odor".  Informed her they should have the wound vac tomorrow.  Informed her that Dr. Cyndi Bender is in surgery but as soon as he is out later today I will speak to him and call her back.  Denies any fevers.  Verbalizes understanding.    Mila Homer, CLINICAL CARE COORDINATOR  12/21/2017, 10:56

## 2017-12-21 NOTE — Telephone Encounter (Signed)
-----   Message from [facility] sent at 12/21/2017 10:27 AM EST -----  Cyndi Bender pt  Pt daughter in law changed her bandage, she has questions. Please return her call

## 2017-12-22 ENCOUNTER — Encounter (INDEPENDENT_AMBULATORY_CARE_PROVIDER_SITE_OTHER): Payer: Self-pay | Admitting: Social Worker

## 2017-12-22 NOTE — Progress Notes (Signed)
MSW received signed [employer] VTIAF from Dr. Cyndi Bender on [phone] at 1121, however, several items on form incomplete. MSW returned to clinical staff for completion.  MSW received completed form on [id number] @ 1300, faxed to [facility].  MSW notified [employer] rep Benita Stabile of form submission.  MSW notified [facility] agency that vac has been ordered, hopefully will be delivered tomorrow.  Agency will speak with family and have them call when vac received.  MSW will continue to follow as needed.    [name] was provided with information about the following community resources:  DME:  Wound Vac

## 2018-01-22 ENCOUNTER — Ambulatory Visit (INDEPENDENT_AMBULATORY_CARE_PROVIDER_SITE_OTHER): Payer: Self-pay | Admitting: SURGICAL ONCOLOGY

## 2018-01-22 NOTE — Telephone Encounter (Signed)
-----   Message from Daphine Deutscher sent at 01/22/2018  8:17 AM EST -----  Druscilla Brownie with [facility] home health called needing to speak with someone about wound care orders for the patient. She states she took the wound vac off Saturday to clean and the patient refused to let her put it back on. Please call her. Thank you

## 2018-01-22 NOTE — Telephone Encounter (Addendum)
Called Jessica with [facility].  Instructed her that I spoke with Dr. Cyndi Bender and she may go to wet to dry dressings.  Patient refused wound vac on Sat.  And home health nurse instructed her on wet to dry dressings. Incison healing well. Vaccine Information Statement    Influenza (Flu) Vaccine (Inactivated or Recombinant): What you need to know  2014-15    Many Vaccine Information Statements are available in Spanish and other languages. See AbsolutelyGenuine.com.br  Hojas de Informacin Sobre Vacunas estn disponibles en Espaol y en muchos otros idiomas. Visite https://www.martin.org/    1. Why get vaccinated?    Influenza ("flu") is a contagious disease that spreads around the [place] every winter, usually between October and May.     Flu is caused by influenza viruses, and is spread mainly by coughing, sneezing, and close contact.     Anyone can get flu, but the risk of getting flu is highest among children. Symptoms come on suddenly and may last several days. They can include:  . fever/chills  . sore throat  . muscle aches  . fatigue  . cough  . headache   . runny or stuffy nose    Flu can make some people much sicker than others. These people include young children, people 14 and older, pregnant women, and people with certain health conditions - such as heart, lung or kidney disease, nervous system disorders, or a weakened immune system.  Flu vaccination is especially important for these people, and anyone in close contact with them.    Flu can also lead to pneumonia, and make existing medical conditions worse. It can cause diarrhea and seizures in children.     Each year thousands of people in the [place] die from flu, and many more are hospitalized.     Flu vaccine is the best protection against flu and its complications. Flu vaccine also helps prevent spreading flu from person to person.       2. Inactivated and recombinant flu vaccines    You are getting an injectable flu vaccine, which is either an  "inactivated" or "recombinant" vaccine. These vaccines do not contain any live influenza virus.  They are given by injection with a needle, and often called the "flu shot."    A different, live, attenuated (weakened) influenza vaccine is sprayed into the nostrils. This vaccine is described in a separate Vaccine Information Statement.    Flu vaccination is recommended every year. Some children 6 months through 40 years of age might need two doses during one year.     Flu viruses are always changing. Each year's flu vaccine is made to protect against 3 or 4 viruses that are likely to cause disease that year. Flu vaccine cannot prevent all cases of flu, but it is the best defense against the disease.     It takes about 2 weeks for protection to develop after the vaccination, and protection lasts several months to a year.     Some illnesses that are not caused by influenza virus are often mistaken for flu. Flu vaccine will not prevent these illnesses. It can only prevent influenza.    Some inactivated flu vaccine contains a very small amount of a mercury-based preservative called thimerosal. Studies have shown that thimerosal in vaccines is not harmful, but flu vaccines that do not contain a preservative are available.      3. Some people should not get this vaccine    Tell the person who gives you  the vaccine:  . If you have any severe, life-threatening allergies.  If you ever had a life-threatening allergic reaction after a dose of flu vaccine, or have a severe allergy to any part of this vaccine, including (for example) an allergy to gelatin, antibiotics, or eggs, you may be advised not to get vaccinated.  Most, but not all, types of flu vaccine contain a small amount of egg protein.     . If you ever had Guillain-Barr Syndrome (a severe paralyzing illness, also called GBS). Some people with a history of GBS should not get this vaccine. This should be discussed with your doctor.  . If you are not feeling well.  It is  usually okay to get flu vaccine when you have a mild illness, but you might be advised to wait until you feel better.  You should come back when you are better.      4. Risks of a vaccine reaction    With a vaccine, like any medicine, there is a chance of side effects. These are usually mild and go away on their own.             Problems that could happen after any vaccine:   . Brief fainting spells can happen after any medical procedure, including vaccination. Sitting or lying down for about 15 minutes can help prevent fainting, and injuries caused by a fall. Tell your doctor if you feel dizzy, or have vision changes or ringing in the ears.   . Severe shoulder pain and reduced range of motion in the arm where a shot was given can happen, very rarely, after a vaccination.   . Severe allergic reactions from a vaccine are very rare, estimated at less than 1 in a million doses. If one were to occur, it would usually be within a few minutes to a few hours after the vaccination.     Mild problems following inactivated flu vaccine:   . soreness, redness, or swelling where the shot was given    . hoarseness  . sore, red or itchy eyes  . cough  . fever  . aches  . headache  . itching  . fatigue  If these problems occur, they usually begin soon after the shot and last 1 or 2 days.     Moderate problems following inactivated flu vaccine:  . Young children who get inactivated flu vaccine and pneumococcal vaccine (PCV13) at the same time may be at increased risk for seizures caused by fever. Ask your doctor for more information. Tell your doctor if a child who is getting flu vaccine has ever had a seizure.    Inactivated flu vaccine does not contain live flu virus, so you cannot get the flu from this vaccine.     As with any medicine, there is a very remote chance of a vaccine causing a serious injury or death.    The safety of vaccines is always being monitored. For more information, visit: http://www.aguilar.org/    5.  What if there is a serious reaction?    What should I look for?  . Look for anything that concerns you, such as signs of a severe allergic reaction, very high  fever, or behavior changes.     Signs of a severe allergic reaction can include hives, swelling of the face and throat, difficulty   breathing, a fast heartbeat, dizziness, and weakness. These would usually start a few minutes to a few hours after the vaccination.  What should I do?  . If you think it is a severe allergic reaction or other emergency that can't wait, call 9-1-1 and get the person to the nearest hospital. Otherwise, call your doctor.    . Afterward, the reaction should be reported to the "Vaccine Adverse Event Reporting    System" (VAERS). Your doctor should file this report, or you can do it yourself through    the VAERS web site at www.vaers.SamedayNews.es, or by calling [phone].    VAERS does not give medical advice.      6. The National Vaccine Injury Compensation Program    The [employer] Vaccine Injury Compensation Program (VICP) is a federal program that was created to compensate people who may have been injured by certain vaccines.    Persons who believe they may have been injured by a vaccine can learn about the program and about filing a claim by calling 909-710-1938 or visiting the [place] website at GoldCloset.com.ee.  There is a time limit to file a claim for compensation.    7. How can I learn more?  . Ask your health care provider.  . Call your local or state health department.  Minette Brine the Centers for Disease Control and   Prevention (CDC):  - Call 928-679-4346 (1-800-CDC-INFO) or  - Visit CDC's website at https://gibson.com/      Vaccine Information Statement (Interim)  Inactivated Influenza Vaccine   07/30/2013  42 [place].C.  [phone]    Department of Health and [facility] for Disease Control and Prevention cm in depth now.  Home health will continue to monitor.   Mila Homer, CLINICAL CARE COORDINATOR   01/22/2018, 09:12

## 2018-01-24 ENCOUNTER — Encounter (INDEPENDENT_AMBULATORY_CARE_PROVIDER_SITE_OTHER): Payer: Self-pay | Admitting: SURGICAL ONCOLOGY

## 2018-01-25 ENCOUNTER — Ambulatory Visit (HOSPITAL_COMMUNITY): Payer: Self-pay | Admitting: Surgery

## 2018-01-31 ENCOUNTER — Encounter (INDEPENDENT_AMBULATORY_CARE_PROVIDER_SITE_OTHER): Payer: Self-pay | Admitting: SURGICAL ONCOLOGY

## 2018-02-05 ENCOUNTER — Ambulatory Visit (INDEPENDENT_AMBULATORY_CARE_PROVIDER_SITE_OTHER): Payer: Self-pay | Admitting: SURGICAL ONCOLOGY

## 2018-02-05 NOTE — Telephone Encounter (Signed)
Called and spoke to Aunika's  sister.  She said she was at the doctors on Monday, Thursday all day  and Friday am.  They were home all day on Tuesday and Wed.  She is aware of the appointment with Dr. Cyndi Bender on Wed. and will be there.  States "her incision is almost healed".  Called home health nurse back and informed her of above.    Mila Homer, CLINICAL CARE COORDINATOR  02/05/2018, 14:23

## 2018-02-05 NOTE — Telephone Encounter (Signed)
-----   Message from [facility] sent at 02/05/2018  1:54 PM EST -----  Margrett Rud from [facility] home health called stating that she cannot get any contact from the patient. She said that she has went to her house twice last week and no one answers the door or phone.

## 2018-02-07 ENCOUNTER — Encounter (INDEPENDENT_AMBULATORY_CARE_PROVIDER_SITE_OTHER): Payer: Self-pay | Admitting: SURGICAL ONCOLOGY

## 2018-02-07 ENCOUNTER — Ambulatory Visit: Payer: Medicare Other | Attending: SURGICAL ONCOLOGY | Admitting: SURGICAL ONCOLOGY

## 2018-02-07 VITALS — BP 130/80 | HR 47 | Temp 96.8°F | Ht 60.0 in | Wt 178.8 lb

## 2018-02-07 DIAGNOSIS — K861 Other chronic pancreatitis: Secondary | ICD-10-CM | POA: Insufficient documentation

## 2018-02-07 DIAGNOSIS — C259 Malignant neoplasm of pancreas, unspecified: Secondary | ICD-10-CM | POA: Insufficient documentation

## 2018-02-07 DIAGNOSIS — K8020 Calculus of gallbladder without cholecystitis without obstruction: Secondary | ICD-10-CM | POA: Insufficient documentation

## 2018-02-07 DIAGNOSIS — Z9221 Personal history of antineoplastic chemotherapy: Secondary | ICD-10-CM | POA: Insufficient documentation

## 2018-02-07 DIAGNOSIS — Z9889 Other specified postprocedural states: Secondary | ICD-10-CM | POA: Insufficient documentation

## 2018-02-07 MED ORDER — OMEPRAZOLE 40 MG CAPSULE,DELAYED RELEASE
40.0000 mg | DELAYED_RELEASE_CAPSULE | Freq: Every day | ORAL | 4 refills | Status: DC
Start: 2018-02-07 — End: 2020-04-16

## 2018-02-07 NOTE — Progress Notes (Signed)
SURGICAL ONCOLOGY POST-OPERATIVE NOTE    HPI: Ms. 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 Given her treatment response, she was taken to the operating room on 10/25/2017 by Dr. Cyndi Bender for robotic Whipple procedure.  Her postoperative course was complicated by a chyle leak and poor PO intake, which resolved with bowel rest, TPN, and drainage.   Her wound also was opened and packed at the bedside.  Unfortunately, she was then readmitted for poor PO intake and wound care. A wound VAC was placed after serial debridements of the wound and her intake improved.  She was then discharged to a skilled nursing facility and returned to clinic on 12/20/17. She seemed to be doing better and was cleared for discharge to home with continuation of the wound VAC. She returns today for ongoing assessment.     Again, both the patient and her sister report that she continues to improve. She is eating without difficulties and ambulating well, now with only a cane. She denies any fever, chills, nausea, or vomiting. She does continue to report occasional soft stools (no oily, greasy, floating stools) and a residual bulge around the umbilicus. She denies nausea, vomiting, constipation, fever, or chills. She did have home health stop the wound VAC on 01/19/18 due to superficial blistering around the site. It has now closed. Lastly, she tells me that she has received one cycle of chemotherapy under Dr. Durene Romans, yet the next two infusions have been delayed due to low cell counts.    OBJECTIVE:  BP 130/80   Pulse 47   Temp 36 C (96.8 F)   Ht 1.524 m (5')   Wt 81.1 kg (178 lb 12.7 oz)   SpO2 98%   BMI 34.92 kg/m   A&O, NAD  No respiratory distress  Abdoment soft, nondistended, non-tender, non-distended. LLQ incision intact and well healed without complication or infection. Easily-reducible, uncomplicated periumbilical  hernia.    Pathology  Final Pathologic Diagnosis    A. GALLBLADDER, CHOLECYSTECTOMY:    - Cholesterolosis.    - Cholelithiasis.    - No malignancy identified.    B. PORTION OF PANCREAS, DUODENUM, AND DISTAL STOMACH, PANCREATICODUODENECTOMY:    - Well-differentiated ductal adenocarcinoma, involving the pancreatic head  (see Cancer Case Summary).    - Seven lymph nodes, negative for malignancy (0/7).    - Pancreatic intraepithelial neoplasia (PanIN 3).    - Chronic pancreatitis.    - Stomach and duodenum with no evidence of malignancy.    C. FINAL BILE DUCT MARGIN, EXCISION:    - No malignancy identified.    CANCER CASE SUMMARY - CARCINOMA OF THE PANCREAS  Procedure: Pancreaticoduodenectomy, cholecystectomy, and additional bile duct  margin excision.  Tumor site: Pancreatic head.  Tumor size: 5.0 x 4.2 x 2.3 cm.   Histologic type: Ductal adenocarcinoma.  Histologic grade: G1 - well-differentiated.  Tumor extension: Tumor is confirmed to pancreas.   Margins: All margins are uninvolved by invasive carcinoma and high-grade  intraepithelial neoplasia.    Pancreatic neck margin: Uninvolved.    Uncinate (retroperitoneal/superior mesenteric artery) margin: Uninvolved  (2 mm from the closest margin, slide   B10).    Bile duct margin: Uninvolved.    Proximal margin: Uninvolved.    Distal margin: Uninvolved.    Vascular groove: Uninvolved.  Treatment effect: Absent.    Extensive residual cancer with no evident tumor regression (poor or no  response, score 3).  Lymphovascular  invasion: Not identified.  Perineural invasion: Present (slide B9).  Regional lymph nodes:     Number of lymph nodes involved: 0.    Number of lymph nodes examined: 7.  Pathologic stage classification: ypT3, pN0.  Additional pathologic findings: Pancreatic intraepithelial neoplasia (PanIN 3),  chronic pancreatitis.       Comment  Selected slides from part  B were reviewed with Dr. Lance Morin with concurrence.         lctr - 10/30/2017 Electronically Signed By: Les Pou, M.D.  10/30/2017 11:08     ASSESSMENT/PLAN:  72 year old female status post robotic assisted Whipple for pancreatic adenocarcinoma on 10/25/17 with Dr. Cyndi Bender.  She had a complicated postoperative course, yet is now doing better. Per Dr. Hassan Rowan note, she has been started on single agent Gemzar. She will continue this at his discretion. Otherwise, she should continue to advance her activity and diet. Furthermore, her umbilical hernia was discussed in detail. We will plan to continue observation of this given lack of emergent findings. She was asked to remain vigilant for these, however, and seek immediate medical attention if they arise. She was also provided with an abdominal binder.     She will return to our clinic in 6 months. A 1-year supply of 40 mg Prilosec Qdaily was eRx to the pharmacy of her choice ([facility] in [place], [place]).  She was given the opportunity to ask questions and those questions were answered to her satisfaction. She agreed with the treatment plan and was encouraged to call with any additional questions or concerns. The patient was seen in conjunction with cosigning faculty, Dr. Cyndi Bender, at the visit today.     [address] Thayer Ohm 02/07/2018, 11:50  Physician Assistant Certified  [place] [facility]    I personally saw and examined the patient. See physician's assistant note for additional details.     She is a 72 year old female with pancreatic adenocarcinoma treated with neoadjuvant gem abraxane for 2 cycles followed by XRT s/p robotic assisted whipple on 10/25/2017.  Although she had a slow recovery she is now doing extremely well and has started on adjuvant chemotherapy.    I spent 10 minutes out of 20 minutes counseling her regarding her postoperative recovery, role for adjuvant chemotherapy and need for lifelong PPI  therapy.    Lynwood Dawley, MD

## 2018-03-28 NOTE — Care Management Notes (Signed)
QSOFA SCREENING    qSOFA Score >= 2 = Positive              Value               Score    Respiratory Rate >= 22 breaths per minute = 1 point 18 0   Systolic Blood Pressure <= 100 mmHg = 1 point 128 0   Altered Mental Status: GCS <15 = 1 point 15 0   Total Score  0     Serum Lactate Level > = 81mmol/L (36 mg/ dL)= Positive:  Initial call level (if not available put NA) n/a   Followed up call level         Positive Screen: no  Antibiotics:  no  Fluids/Pressor:  no  Blood Cultures:  no  Serum Lactate:   no         Dr Tawanna Sat from [facility] ED spoke with [facility] . Tish Frederickson and patient accepted for transfer pulmonary embolus.       Duanne Moron, RN

## 2018-03-29 ENCOUNTER — Inpatient Hospital Stay (HOSPITAL_COMMUNITY): Payer: Medicare Other | Admitting: SURGICAL ONCOLOGY

## 2018-03-29 ENCOUNTER — Inpatient Hospital Stay (HOSPITAL_COMMUNITY): Payer: Medicare Other

## 2018-03-29 ENCOUNTER — Inpatient Hospital Stay
Admission: EM | Admit: 2018-03-29 | Discharge: 2018-04-03 | DRG: 176 | Disposition: A | Discharge status: Rehabilitation Facility | Payer: Medicare Other | Source: Other Acute Inpatient Hospital | Attending: SURGICAL ONCOLOGY | Admitting: SURGICAL ONCOLOGY

## 2018-03-29 ENCOUNTER — Encounter (HOSPITAL_COMMUNITY): Payer: Self-pay | Admitting: Student in an Organized Health Care Education/Training Program

## 2018-03-29 DIAGNOSIS — E11649 Type 2 diabetes mellitus with hypoglycemia without coma: Secondary | ICD-10-CM | POA: Diagnosis not present

## 2018-03-29 DIAGNOSIS — E669 Obesity, unspecified: Secondary | ICD-10-CM | POA: Diagnosis present

## 2018-03-29 DIAGNOSIS — K439 Ventral hernia without obstruction or gangrene: Secondary | ICD-10-CM | POA: Diagnosis present

## 2018-03-29 DIAGNOSIS — Z7982 Long term (current) use of aspirin: Secondary | ICD-10-CM

## 2018-03-29 DIAGNOSIS — Z6839 Body mass index (BMI) 39.0-39.9, adult: Secondary | ICD-10-CM

## 2018-03-29 DIAGNOSIS — I2699 Other pulmonary embolism without acute cor pulmonale: Secondary | ICD-10-CM

## 2018-03-29 DIAGNOSIS — D649 Anemia, unspecified: Secondary | ICD-10-CM

## 2018-03-29 DIAGNOSIS — S199XXA Unspecified injury of neck, initial encounter: Secondary | ICD-10-CM

## 2018-03-29 DIAGNOSIS — D72829 Elevated white blood cell count, unspecified: Secondary | ICD-10-CM

## 2018-03-29 DIAGNOSIS — R12 Heartburn: Secondary | ICD-10-CM | POA: Diagnosis not present

## 2018-03-29 DIAGNOSIS — R600 Localized edema: Secondary | ICD-10-CM | POA: Diagnosis present

## 2018-03-29 DIAGNOSIS — C259 Malignant neoplasm of pancreas, unspecified: Secondary | ICD-10-CM | POA: Diagnosis present

## 2018-03-29 DIAGNOSIS — R410 Disorientation, unspecified: Secondary | ICD-10-CM | POA: Diagnosis not present

## 2018-03-29 DIAGNOSIS — Z923 Personal history of irradiation: Secondary | ICD-10-CM

## 2018-03-29 DIAGNOSIS — Z833 Family history of diabetes mellitus: Secondary | ICD-10-CM

## 2018-03-29 DIAGNOSIS — Z9221 Personal history of antineoplastic chemotherapy: Secondary | ICD-10-CM

## 2018-03-29 DIAGNOSIS — E785 Hyperlipidemia, unspecified: Secondary | ICD-10-CM | POA: Diagnosis present

## 2018-03-29 DIAGNOSIS — I10 Essential (primary) hypertension: Secondary | ICD-10-CM | POA: Diagnosis present

## 2018-03-29 DIAGNOSIS — J449 Chronic obstructive pulmonary disease, unspecified: Secondary | ICD-10-CM | POA: Diagnosis present

## 2018-03-29 DIAGNOSIS — R51 Headache: Secondary | ICD-10-CM | POA: Diagnosis not present

## 2018-03-29 DIAGNOSIS — N179 Acute kidney failure, unspecified: Secondary | ICD-10-CM | POA: Diagnosis not present

## 2018-03-29 DIAGNOSIS — Z87891 Personal history of nicotine dependence: Secondary | ICD-10-CM

## 2018-03-29 DIAGNOSIS — Z79899 Other long term (current) drug therapy: Secondary | ICD-10-CM

## 2018-03-29 DIAGNOSIS — Z8249 Family history of ischemic heart disease and other diseases of the circulatory system: Secondary | ICD-10-CM

## 2018-03-29 DIAGNOSIS — Z7984 Long term (current) use of oral hypoglycemic drugs: Secondary | ICD-10-CM

## 2018-03-29 HISTORY — DX: Other pulmonary embolism without acute cor pulmonale (CMS HCC): I26.99

## 2018-03-29 LAB — BASIC METABOLIC PANEL
ANION GAP: 13 mmol/L (ref 4–13)
BUN/CREA RATIO: 16 (ref 6–22)
BUN: 18 mg/dL (ref 8–25)
CALCIUM: 7.2 mg/dL — ABNORMAL LOW (ref 8.5–10.2)
CHLORIDE: 103 mmol/L (ref 96–111)
CO2 TOTAL: 19 mmol/L — ABNORMAL LOW (ref 22–32)
CREATININE: 1.12 mg/dL — ABNORMAL HIGH (ref 0.49–1.10)
ESTIMATED GFR: 48 mL/min/1.73mˆ2 — ABNORMAL LOW (ref >59)
GLUCOSE: 113 mg/dL (ref 65–139)
POTASSIUM: 3.9 mmol/L (ref 3.5–5.1)
SODIUM: 135 mmol/L — ABNORMAL LOW (ref 136–145)

## 2018-03-29 LAB — CBC WITH DIFF
HCT: 19.6 % — ABNORMAL LOW (ref 34.8–46.0)
HGB: 6.7 g/dL — CL (ref 11.5–16.0)
HGB: 6.7 g/dL — CL (ref 11.5–16.0)
MCH: 34.2 pg — ABNORMAL HIGH (ref 26.0–32.0)
MCHC: 34.2 g/dL (ref 31.0–35.5)
MCV: 100 fL (ref 78.0–100.0)
MPV: 11.9 fL (ref 8.7–12.5)
PLATELETS: 123 x10ˆ3/uL — ABNORMAL LOW (ref 150–400)
RBC: 1.96 x10ˆ6/uL — ABNORMAL LOW (ref 3.85–5.22)
RDW-CV: 21.7 % — ABNORMAL HIGH (ref 11.5–15.5)
WBC: 104 x10ˆ3/uL — ABNORMAL HIGH (ref 3.7–11.0)

## 2018-03-29 LAB — HEPATIC FUNCTION PANEL
ALBUMIN: 2 g/dL — ABNORMAL LOW (ref 3.4–4.8)
ALKALINE PHOSPHATASE: 258 U/L — ABNORMAL HIGH (ref <150)
ALT (SGPT): 13 U/L (ref <55)
AST (SGOT): 25 U/L (ref 8–41)
BILIRUBIN DIRECT: 0.4 mg/dL — ABNORMAL HIGH (ref <0.3)
BILIRUBIN TOTAL: 0.7 mg/dL (ref 0.3–1.3)
PROTEIN TOTAL: 3.6 g/dL — ABNORMAL LOW (ref 6.0–8.0)

## 2018-03-29 LAB — POC BLOOD GLUCOSE (RESULTS)
GLUCOSE, POC: 116 mg/dl — ABNORMAL HIGH (ref 70–105)
GLUCOSE, POC: 59 mg/dl — ABNORMAL LOW (ref 70–105)
GLUCOSE, POC: 66 mg/dl — ABNORMAL LOW (ref 70–105)
GLUCOSE, POC: 136 mg/dl — ABNORMAL HIGH (ref 70–105)
GLUCOSE, POC: 58 mg/dl — ABNORMAL LOW (ref 70–105)
GLUCOSE, POC: 62 mg/dl — ABNORMAL LOW (ref 70–105)
GLUCOSE, POC: 158 mg/dl — ABNORMAL HIGH (ref 70–105)

## 2018-03-29 LAB — IRON TRANSFERRIN AND TIBC
IRON (TRANSFERRIN) SATURATION: 91 % — ABNORMAL HIGH (ref 20–50)
IRON: 86 ug/dL (ref 45–170)
TOTAL IRON BINDING CAPACITY: 95 ug/dL — ABNORMAL LOW (ref 210–330)
TRANSFERRIN: 68 mg/dL — ABNORMAL LOW (ref 160–340)

## 2018-03-29 LAB — HGA1C (HEMOGLOBIN A1C WITH EST AVG GLUCOSE)
ESTIMATED AVERAGE GLUCOSE: 160 mg/dL
HEMOGLOBIN A1C: 7.2 % — ABNORMAL HIGH (ref 4.0–6.0)

## 2018-03-29 LAB — OSMOLALITY, RANDOM URINE: OSMOLALITY URINE: 219 mOsm/kg (ref 50–1400)

## 2018-03-29 LAB — MAGNESIUM: MAGNESIUM: 0.9 mg/dL — CL (ref 1.6–2.5)

## 2018-03-29 LAB — CBC
HCT: 18.5 % — ABNORMAL LOW (ref 34.8–46.0)
HGB: 6.3 g/dL — CL (ref 11.5–16.0)
MCH: 34.1 pg — ABNORMAL HIGH (ref 26.0–32.0)
MCHC: 34.1 g/dL (ref 31.0–35.5)
MCV: 100 fL (ref 78.0–100.0)
MPV: 12 fL (ref 8.7–12.5)
PLATELETS: 118 x10ˆ3/uL — ABNORMAL LOW (ref 150–400)
RBC: 1.85 x10ˆ6/uL — ABNORMAL LOW (ref 3.85–5.22)
RDW-CV: 21.7 % — ABNORMAL HIGH (ref 11.5–15.5)
WBC: 97 x10ˆ3/uL — ABNORMAL HIGH (ref 3.7–11.0)

## 2018-03-29 LAB — MANUAL DIFF AND MORPHOLOGY-SYSMEX
BASOPHIL #: <0.1 x10ˆ3/uL (ref <=0.20)
BASOPHIL %: 0 %
EOSINOPHIL #: <0.1 x10ˆ3/uL (ref <=0.50)
EOSINOPHIL %: 0 %
LYMPHOCYTE #: <0.1 x10ˆ3/uL — ABNORMAL LOW (ref 1.00–4.80)
LYMPHOCYTE %: 0 %
MONOCYTE #: <0.1 x10ˆ3/uL — ABNORMAL LOW (ref 0.20–1.10)
MONOCYTE %: 0 %
NEUTROPHIL #: 105.04 10*3/uL — ABNORMAL HIGH (ref 1.50–7.70)
NEUTROPHIL %: 100 %
NEUTROPHIL BANDS %: 1 %

## 2018-03-29 LAB — ALBUMIN: ALBUMIN: 2 g/dL — ABNORMAL LOW (ref 3.4–4.8)

## 2018-03-29 LAB — OSMOLALITY: OSMOLALITY, BLOOD: 284 mOsm/kg (ref 280–300)

## 2018-03-29 LAB — RETICULOCYTE COUNT
IMMATURE RETIC FRACTION: 12.4 % (ref 2.7–15.9)
RETICULOCYTE % AUTOMATED: 2.74 % — ABNORMAL HIGH (ref 0.50–2.20)
RETICULOCYTE HEMOGLOBIN EQUIVALENT: 40.9 pg — ABNORMAL HIGH (ref 28.0–38.0)
RETICULOCYTES COUNT # AUTOMATED: 63 10*3/uL (ref 17.0–100.0)

## 2018-03-29 LAB — VITAMIN B12: VITAMIN B 12: >2000 pg/mL — ABNORMAL HIGH (ref 200–1000)

## 2018-03-29 LAB — UREA NITROGEN, RANDOM URINE: UREA NITROGEN RANDOM URINE: 152 mg/dL

## 2018-03-29 LAB — PTT (PARTIAL THROMBOPLASTIN TIME)
APTT: 37.4 seconds (ref 24.1–38.5)
APTT: 38.9 seconds — ABNORMAL HIGH (ref 24.1–38.5)
APTT: 68.5 seconds — ABNORMAL HIGH (ref 24.1–38.5)
APTT: 152.3 seconds (ref 24.1–38.5)

## 2018-03-29 LAB — PATH COMMENT: PATHOLOGIST INTERPRETATION: ABNORMAL — AB

## 2018-03-29 LAB — H & H
HCT: 27.3 % — ABNORMAL LOW (ref 34.8–46.0)
HGB: 9.2 g/dL — ABNORMAL LOW (ref 11.5–16.0)

## 2018-03-29 LAB — FOLATE: FOLATE: 13.2 ng/mL (ref >=7.0)

## 2018-03-29 LAB — CREATININE URINE, RANDOM: CREATININE RANDOM URINE: 28 mg/dL

## 2018-03-29 LAB — PHOSPHORUS: PHOSPHORUS: 3.9 mg/dL (ref 2.3–4.0)

## 2018-03-29 MED ORDER — HYDROCODONE 5 MG-ACETAMINOPHEN 325 MG TABLET
1.00 | ORAL_TABLET | ORAL | Status: DC | PRN
Start: 2018-03-29 — End: 2018-04-03
  Administered 2018-03-31 – 2018-04-02 (×6): 1 via ORAL
  Filled 2018-03-29 (×6): qty 1

## 2018-03-29 MED ORDER — CITALOPRAM 20 MG TABLET
20.0000 mg | ORAL_TABLET | Freq: Every day | ORAL | Status: DC
Start: 2018-03-29 — End: 2018-04-03
  Administered 2018-03-29 – 2018-04-03 (×7): 20 mg via ORAL
  Filled 2018-03-29 (×6): qty 1

## 2018-03-29 MED ORDER — DEXTROSE 50 % IN WATER (D50W) INTRAVENOUS SYRINGE
INJECTION | INTRAVENOUS | Status: AC
Start: 2018-03-29 — End: 2018-03-29
  Administered 2018-03-29: 50 mL
  Filled 2018-03-29: qty 50

## 2018-03-29 MED ORDER — HEPARIN (PORCINE) 25,000 UNIT/250 ML (100 UNIT/ML) IN DEXTROSE 5 % IV
18.0000 [IU]/kg/h | INTRAVENOUS | Status: DC
Start: 2018-03-29 — End: 2018-03-31
  Administered 2018-03-29: 21 [IU]/kg/h via INTRAVENOUS
  Administered 2018-03-29: 18 [IU]/kg/h via INTRAVENOUS
  Administered 2018-03-29: 21 [IU]/kg/h via INTRAVENOUS
  Administered 2018-03-29: 18 [IU]/kg/h via INTRAVENOUS
  Administered 2018-03-30: 0 [IU]/kg/h via INTRAVENOUS
  Administered 2018-03-30: 15 [IU]/kg/h via INTRAVENOUS
  Administered 2018-03-30: 16 [IU]/kg/h via INTRAVENOUS
  Administered 2018-03-30: 19 [IU]/kg/h via INTRAVENOUS
  Administered 2018-03-30: 16 [IU]/kg/h via INTRAVENOUS
  Administered 2018-03-30: 0 [IU]/kg/h via INTRAVENOUS
  Administered 2018-03-31: 16 [IU]/kg/h via INTRAVENOUS
  Administered 2018-03-31: 17 [IU]/kg/h via INTRAVENOUS
  Administered 2018-03-31: 0 [IU]/kg/h via INTRAVENOUS
  Filled 2018-03-29 (×4): qty 250

## 2018-03-29 MED ORDER — SODIUM CHLORIDE 0.9 % (FLUSH) INJECTION SYRINGE
2.0000 mL | INJECTION | Freq: Three times a day (TID) | INTRAMUSCULAR | Status: DC
Start: 2018-03-29 — End: 2018-04-03
  Administered 2018-03-29: 2 mL
  Administered 2018-03-29 (×2): 0 mL
  Administered 2018-03-30: 2 mL
  Administered 2018-03-30 (×2): 0 mL
  Administered 2018-03-31: 2 mL
  Administered 2018-03-31: 4 mL
  Administered 2018-03-31 – 2018-04-01 (×2): 0 mL
  Administered 2018-04-01: 2 mL
  Administered 2018-04-01 – 2018-04-02 (×2): 0 mL
  Administered 2018-04-02: 2 mL
  Administered 2018-04-02: 0 mL
  Administered 2018-04-03: 2 mL
  Administered 2018-04-03: 0 mL

## 2018-03-29 MED ORDER — MAGNESIUM SULFATE 2 GRAM/50 ML (4 %) IN WATER INTRAVENOUS PIGGYBACK
2.0000 g | INJECTION | Freq: Once | INTRAVENOUS | Status: AC
Start: 2018-03-29 — End: 2018-03-29
  Administered 2018-03-29: 0 g via INTRAVENOUS
  Administered 2018-03-29: 2 g via INTRAVENOUS
  Filled 2018-03-29: qty 50

## 2018-03-29 MED ORDER — METFORMIN 500 MG TABLET
1000.00 mg | ORAL_TABLET | Freq: Two times a day (BID) | ORAL | Status: DC
Start: 2018-03-29 — End: 2018-04-03
  Administered 2018-03-29: 0 mg via ORAL
  Administered 2018-03-29 – 2018-03-31 (×5): 1000 mg via ORAL
  Administered 2018-04-01: 0 mg via ORAL
  Administered 2018-04-01 – 2018-04-02 (×3): 1000 mg via ORAL
  Administered 2018-04-03: 0 mg via ORAL
  Filled 2018-03-29 (×12): qty 2

## 2018-03-29 MED ORDER — GABAPENTIN 300 MG CAPSULE
300.0000 mg | ORAL_CAPSULE | Freq: Every day | ORAL | Status: DC
Start: 2018-03-29 — End: 2018-04-03
  Administered 2018-03-29 – 2018-04-03 (×6): 300 mg via ORAL
  Filled 2018-03-29 (×6): qty 1

## 2018-03-29 MED ORDER — INSULIN LISPRO 100 UNIT/ML SUB-Q SSIP
3.00 [IU] | INJECTION | Freq: Four times a day (QID) | SUBCUTANEOUS | Status: DC | PRN
Start: 2018-03-29 — End: 2018-04-03
  Administered 2018-03-30: 3 [IU] via SUBCUTANEOUS
  Filled 2018-03-29: qty 3

## 2018-03-29 MED ORDER — CETIRIZINE 10 MG TABLET
10.0000 mg | ORAL_TABLET | Freq: Every day | ORAL | Status: DC
Start: 2018-03-29 — End: 2018-04-03
  Administered 2018-03-29 – 2018-04-03 (×6): 10 mg via ORAL
  Filled 2018-03-29 (×6): qty 1

## 2018-03-29 MED ORDER — LACTATED RINGERS INTRAVENOUS SOLUTION
INTRAVENOUS | Status: DC
Start: 2018-03-29 — End: 2018-03-31

## 2018-03-29 MED ORDER — MONTELUKAST 10 MG TABLET
10.00 mg | ORAL_TABLET | Freq: Every evening | ORAL | Status: DC
Start: 2018-03-29 — End: 2018-04-03
  Administered 2018-03-29 – 2018-04-02 (×6): 10 mg via ORAL
  Filled 2018-03-29 (×7): qty 1

## 2018-03-29 MED ORDER — METOPROLOL SUCCINATE ER 50 MG TABLET,EXTENDED RELEASE 24 HR
50.0000 mg | ORAL_TABLET | Freq: Every day | ORAL | Status: DC
Start: 2018-03-29 — End: 2018-04-03
  Administered 2018-03-29 – 2018-04-03 (×6): 50 mg via ORAL
  Filled 2018-03-29 (×6): qty 1

## 2018-03-29 MED ORDER — SODIUM CHLORIDE 0.9 % IV BOLUS
40.00 mL | INJECTION | Freq: Once | Status: AC | PRN
Start: 2018-03-29 — End: 2018-03-29

## 2018-03-29 MED ORDER — GLIPIZIDE 5 MG TABLET
5.00 mg | ORAL_TABLET | Freq: Two times a day (BID) | ORAL | Status: DC
Start: 2018-03-29 — End: 2018-04-03
  Administered 2018-03-29: 5 mg via ORAL
  Administered 2018-03-29: 0 mg via ORAL
  Administered 2018-03-30 – 2018-03-31 (×4): 5 mg via ORAL
  Administered 2018-04-01: 0 mg via ORAL
  Administered 2018-04-01 – 2018-04-02 (×2): 5 mg via ORAL
  Administered 2018-04-02 – 2018-04-03 (×2): 0 mg via ORAL
  Filled 2018-03-29 (×13): qty 1

## 2018-03-29 MED ORDER — SODIUM CHLORIDE 0.9 % IV BOLUS
40.0000 mL | INJECTION | Freq: Once | Status: AC | PRN
Start: 2018-03-29 — End: 2018-03-29

## 2018-03-29 MED ORDER — GLIPIZIDE ER 10 MG TABLET, EXTENDED RELEASE 24 HR
10.00 mg | EXTENDED_RELEASE_TABLET | Freq: Every morning | ORAL | Status: DC
Start: 2018-03-29 — End: 2018-03-29

## 2018-03-29 MED ORDER — PANTOPRAZOLE 20 MG TABLET,DELAYED RELEASE
20.0000 mg | DELAYED_RELEASE_TABLET | Freq: Every day | ORAL | Status: DC
Start: 2018-03-29 — End: 2018-04-03
  Administered 2018-03-29 – 2018-04-03 (×6): 20 mg via ORAL
  Filled 2018-03-29 (×6): qty 1

## 2018-03-29 MED ORDER — ONDANSETRON HCL 8 MG TABLET
8.00 mg | ORAL_TABLET | Freq: Three times a day (TID) | ORAL | Status: DC | PRN
Start: 2018-03-29 — End: 2018-04-03
  Administered 2018-03-30 – 2018-04-02 (×4): 8 mg via ORAL
  Filled 2018-03-29 (×9): qty 1

## 2018-03-29 MED ORDER — SODIUM CHLORIDE 0.9 % (FLUSH) INJECTION SYRINGE
2.0000 mL | INJECTION | INTRAMUSCULAR | Status: DC | PRN
Start: 2018-03-29 — End: 2018-04-03

## 2018-03-29 MED ADMIN — magnesium sulfate 2 gram/50 mL (4 %) in water intravenous piggyback: INTRAVENOUS | @ 05:00:00

## 2018-03-29 MED ADMIN — ondansetron HCl (PF) 4 mg/2 mL injection solution: @ 14:00:00

## 2018-03-29 MED ADMIN — acetaminophen 1,000 mg/100 mL (10 mg/mL) intravenous solution: INTRAVENOUS | @ 23:00:00

## 2018-03-29 NOTE — Nurses Notes (Signed)
Second unit PRBC's transfusion complete at this time.  No symptoms of adverse reaction noted.  VSS.  Patient resting comfortably in bed at this time.

## 2018-03-29 NOTE — Care Plan (Addendum)
Pt admitted for PE, decreased oxygen and BLE swelling. Fall and skin precautions maintained. Pt denies pain or SOB. Pt received replacement for magnesium 0.9. HGB: 6.7, then 6.3. Order to transfuse 2 units of PRBCS. Pt is on heparin drip for PE. Pt is NPO except for sips of water. MIVF and foley maintained. Pt is resting with call bell at reach. Will continue to monitor.   Problem: Adult Inpatient Plan of Care  Goal: Plan of Care Review  03/29/2018 0529 by Kaimana Neuzil, Retta Diones, RN  Outcome: Ongoing (see interventions/notes)  03/29/2018 0446 by Alverda Skeans, RN  Outcome: Ongoing (see interventions/notes)  Goal: Patient-Specific Goal (Individualization)  03/29/2018 0529 by Alverda Skeans, RN  Outcome: Ongoing (see interventions/notes)  03/29/2018 0446 by Alverda Skeans, RN  Outcome: Ongoing (see interventions/notes)  Goal: Absence of Hospital-Acquired Illness or Injury  03/29/2018 0529 by Ovila Lepage, Retta Diones, RN  Outcome: Ongoing (see interventions/notes)  03/29/2018 0446 by Alverda Skeans, RN  Outcome: Ongoing (see interventions/notes)  Goal: Optimal Comfort and Wellbeing  03/29/2018 0529 by Alverda Skeans, RN  Outcome: Ongoing (see interventions/notes)  03/29/2018 0446 by Alverda Skeans, RN  Outcome: Ongoing (see interventions/notes)  Goal: Rounds/Family Conference  03/29/2018 0529 by Alverda Skeans, RN  Outcome: Ongoing (see interventions/notes)  03/29/2018 0446 by Alverda Skeans, RN  Outcome: Ongoing (see interventions/notes)     Problem: Fall Injury Risk  Goal: Absence of Fall and Fall-Related Injury  03/29/2018 0529 by Kemet Nijjar, Retta Diones, RN  Outcome: Ongoing (see interventions/notes)  03/29/2018 0446 by Alverda Skeans, RN  Outcome: Ongoing (see interventions/notes)     Problem: Skin Injury Risk Increased  Goal: Skin Health and Integrity  03/29/2018 0529 by Alverda Skeans, RN  Outcome: Ongoing (see interventions/notes)  03/29/2018 0446  by Alverda Skeans, RN  Outcome: Ongoing (see interventions/notes)     Problem: Fluid Volume Excess  Goal: Fluid Balance  03/29/2018 0529 by Alverda Skeans, RN  Outcome: Ongoing (see interventions/notes)  03/29/2018 0446 by Alverda Skeans, RN  Outcome: Ongoing (see interventions/notes)     Problem: Adjustment to Illness COPD (Chronic Obstructive Pulmonary Disease)  Goal: Optimal Chronic Illness Coping  03/29/2018 0529 by Alverda Skeans, RN  Outcome: Ongoing (see interventions/notes)  03/29/2018 0446 by Alverda Skeans, RN  Outcome: Ongoing (see interventions/notes)     Problem: Functional Ability Impaired COPD (Chronic Obstructive Pulmonary Disease)  Goal: Optimal Level of Functional Independence  03/29/2018 0529 by Permelia Bamba, Retta Diones, RN  Outcome: Ongoing (see interventions/notes)  03/29/2018 0446 by Alverda Skeans, RN  Outcome: Ongoing (see interventions/notes)     Problem: Oral Intake Inadequate COPD (Chronic Obstructive Pulmonary Disease)  Goal: Improved Nutrition Intake  03/29/2018 0529 by Taelyn Broecker, Retta Diones, RN  Outcome: Ongoing (see interventions/notes)  03/29/2018 0446 by Alverda Skeans, RN  Outcome: Ongoing (see interventions/notes)     Problem: Infection COPD (Chronic Obstructive Pulmonary Disease)  Goal: Absence of Infection Signs/Symptoms  03/29/2018 0529 by Alverda Skeans, RN  Outcome: Ongoing (see interventions/notes)  03/29/2018 0446 by Alverda Skeans, RN  Outcome: Ongoing (see interventions/notes)     Problem: Respiratory Compromise COPD (Chronic Obstructive Pulmonary Disease)  Goal: Effective Oxygenation and Ventilation  03/29/2018 0529 by Deleon Passe, Retta Diones, RN  Outcome: Ongoing (see interventions/notes)  03/29/2018 0446 by Alverda Skeans, RN  Outcome: Ongoing (see interventions/notes)     Problem: Skin Injury Risk Increased  Goal: Skin Health and Integrity  03/29/2018 0529 by Alverda Skeans,  RN  Outcome: Ongoing  (see interventions/notes)  03/29/2018 0446 by Alverda Skeans, RN  Outcome: Ongoing (see interventions/notes)     Problem: Venous Thromboembolism  Goal: VTE (Venous Thromboembolism) Symptom Resolution  Outcome: Ongoing (see interventions/notes)

## 2018-03-29 NOTE — Ancillary Notes (Signed)
Diabetes Education    Stopped to assess diabetes education needs. Patient seen by DEC in December 2018. Glucose meter and supplies were pended by educator, but patient states the prescription was not sent to her pharmacy. The patient states that she has had diabetes for about 18 years. The patient states that she takes Metformin. The patient verbalized understanding and compliance with medication regimen. The patient stated that she bought a meter at [facility] and tests glucose 2-3 times a day at home. The patient states that she ranges in the 100s mg/dL. Current A1c :    Lab Results   Component Value Date    HA1C 7.2 (H) 03/29/2018       The patient denies issues with obtaining testing supplies or medications. We reviewed hypoglycemia. Patient shares that she had an episode today. We reviewed progression of disease, ADA BG targets and the importance of testing and avoidance of complications by managing blood sugars. Answered all questions. Verbalized understanding. Contact information offered.    Candy Sledge, RDLD  03/29/2018, 16:44

## 2018-03-29 NOTE — Nurses Notes (Signed)
Septic alert received and pt c/o of itching. Dr. Halina Andreas notified. Will continue to monitor.

## 2018-03-29 NOTE — Nurses Notes (Addendum)
Patient finger stick 66.  Patient awake, but reporting head ache.  4oz orange juice given.  Will recheck finger stick in 15 minutes.    Dorina Hoyer on service paged to notify    1410--FS: 34, patient alert and asymptomatic.  8 oz orange juice given with sugar.  Will recheck in 15 minutes.    1425--FS: 58, D50 given IV.  Patient to go to CT.  Will recheck once back on floor.    1505--FS: Herminie on service paged to notify.

## 2018-03-29 NOTE — Care Plan (Signed)
Patient remains admitted for PE and increased SOB with headaches.  Per history, patient experienced fall prior to admission--remains on high fall precautions.  Education provided r/t [employer], unit procedures, and POC.  CT performed on neck/head.  Patient received 2 units PRBC's for low Hgb.  Remains on Heparin gtt--titrated per protocol.  MIVF infusing.  Walked in halls with PT--tolerated well.  Ambulates to bathroom with staff assist x1 and FWW.  Advanced to Diabetic diet with CC 2200.     Problem: Adult Inpatient Plan of Care  Goal: Plan of Care Review  Outcome: Ongoing (see interventions/notes)  Flowsheets (Taken 03/29/2018 1550)  Plan of Care Reviewed With: patient  Goal: Optimal Comfort and Wellbeing  Outcome: Ongoing (see interventions/notes)     Problem: Fall Injury Risk  Goal: Absence of Fall and Fall-Related Injury  Outcome: Ongoing (see interventions/notes)     Problem: Skin Injury Risk Increased  Goal: Skin Health and Integrity  Outcome: Ongoing (see interventions/notes)     Problem: Fluid Volume Excess  Goal: Fluid Balance  Outcome: Ongoing (see interventions/notes)     Problem: Adjustment to Illness COPD (Chronic Obstructive Pulmonary Disease)  Goal: Optimal Chronic Illness Coping  Outcome: Ongoing (see interventions/notes)     Problem: Functional Ability Impaired COPD (Chronic Obstructive Pulmonary Disease)  Goal: Optimal Level of Functional Independence  Outcome: Ongoing (see interventions/notes)     Problem: Infection COPD (Chronic Obstructive Pulmonary Disease)  Goal: Absence of Infection Signs/Symptoms  Outcome: Ongoing (see interventions/notes)     Problem: Venous Thromboembolism  Goal: VTE (Venous Thromboembolism) Symptom Resolution  Outcome: Ongoing (see interventions/notes)

## 2018-03-29 NOTE — Consults (Signed)
[facility]  Hematology Oncology Initial Consult Note    [place], [name]  Encounter Start Date:  03/29/2018  Inpatient Admission Date:  03/29/2018  Date of Service: 03/29/2018  Date of Birth:  1946/05/20    Requesting MD: Dr. Dorena Bodo is a 72 y.o. female who is seen in consultation at the request of the primary team for discussion of treatment for anemia and leukocytosis in the setting of adjuvant chemotherapy in Stage 1B pancreatic cancer.    Cancer was found during workup of symptomatic illness including epigastric discomfort. The gallbladder was initially suspected with dietary modifications pursued. N/V developed and then an ED evaluation prompted RUQ [place] and MRI abdomen with revelation of the pancreatic mass.     FNA via EUS was performed on 03/30/17 and was positive for adenocarcinoma. She was staged as 1B. She received 2 cycles of neoadjuvant Gem/abraxane 5/15-7/2 with her poorly tolerating the regimen and PET/CT on 06/21/17 showing no significant response of the tumor and SMV still abutting the tumor.     Surgical oncology elected for further neoadjuvant chemotherapy and started 5-FU 07/26/17 with concurrent RT on 07/27/18.   Per the records it is known that she received 2 cycles of this regimen but still looking to obtain complete records past August.    She had robotic whipple procedure on 10/25/17. Pathologic stage T3N0 with negative margins. She has dealt with a post-operative chyle leak requiring TPN, drainage, infection control.     She last had on body neulasta this past Monday and has gotten 3 cycles of it q 2 weeks. Chemo last given 03/26/18 by Dr. Durene Romans. She has noted a 1 month history of worsening leg swelling (R>L).     She lives with her younger sister.     HPI/Discussion: She has been dealing with SOB, N/V, HA, LH.    Past Medical History:   Diagnosis Date   . Abdominal hernia 10/18/2017    hx of repair   . Anxiety    . Arthritis    . Asthma    . Atrial fibrillation (CMS  HCC)    . Back problem    . Bruises easily    . Cancer (CMS [place]) 10/18/2017    chemo and radiation completed 09/24/2017   . COPD (chronic obstructive pulmonary disease) (CMS HCC)    . CPAP (continuous positive airway pressure) dependence 10/18/2017    has not used recently   . Depression    . DM (diabetes mellitus) (CMS HCC) 2000    Fasting BG 200's. HGA1C 2018 6   . Edema    . Essential hypertension    . GERD (gastroesophageal reflux disease)    . Headache    . Heartburn    . Hx antineoplastic chemo 2018   . Hx of radiation therapy 2018   . Hypercholesteremia    . Hyperlipidemia    . Migraine 10/18/2017    none recently   . Obesity    . Palpitations    . Pancreatic cancer (CMS [place]) 03/2017   . Panic attack    . PE (pulmonary thromboembolism) (CMS HCC) 03/29/2018   . Peripheral neuropathy 10/18/2017    knees down, bilateral   . Sleep apnea    . Type 2 diabetes mellitus (CMS HCC) 10/18/2017    Dx 2000 FBS 200s   . Unintentional weight loss 10/18/2017    25 lbs 03/2017   . Wears glasses  Past Surgical History:   Procedure Laterality Date   . CESAREAN SECTION     . HX HERNIA REPAIR     . HX SUBCLAVIAN PORT IMPLANTION      s/p removed   . HX SUBCLAVIAN PORT IMPLANTION     . HX TONSIL AND ADENOIDECTOMY     . HX TONSILLECTOMY     . UMBILICAL HERNIA REPAIR      x2         Medications Prior to Admission     Prescriptions    aspirin (ECOTRIN) 81 mg Oral Tablet, Delayed Release (E.C.)    Take 81 mg by mouth    atorvastatin (LIPITOR) 10 mg Oral Tablet    Take 10 mg by mouth    cetirizine (ZYRTEC) 10 mg Oral Tablet    Take 10 mg by mouth    citalopram (CELEXA) 20 mg Oral Tablet    Take 20 mg by mouth    furosemide (LASIX) 20 mg Oral Tablet    20 mg Once a day     gabapentin (NEURONTIN) 300 mg Oral Capsule    300 mg Once a day     glipiZIDE (GLUCOTROL XL) 10 mg Oral Tablet Extended Rel 24 hr (2)    Take 10 mg by mouth    hydroCHLOROthiazide (HYDRODIURIL) 25 mg Oral Tablet    25 mg Once a day     HYDROcodone-acetaminophen  (NORCO) 5-325 mg Oral Tablet    Take 1 Tab by mouth Every 4 hours as needed for Pain    lidocaine-prilocaine (EMLA) 2.5-2.5 % Cream    LORazepam (ATIVAN) 0.5 mg Oral Tablet    0.5 mg Once a day     losartan (COZAAR) 100 mg Oral Tablet    Take 100 mg by mouth Once a day     MetFORMIN (GLUCOPHAGE) 1,000 mg Oral Tablet    1,000 mg Twice daily with food     metoprolol succinate (TOPROL-XL) 50 mg Oral Tablet Sustained Release 24 hr    50 mg Once a day     montelukast (SINGULAIR) 10 mg Oral Tablet    Take 10 mg by mouth    omeprazole (PRILOSEC) 40 mg Oral Capsule, Delayed Release(E.C.)    Take 1 Cap (40 mg total) by mouth Once a day    ondansetron (ZOFRAN) 8 mg Oral Tablet    Take 8 mg by mouth Every 8 hours as needed     oxyCODONE (ROXICODONE) 5 mg Oral Tablet    Take 1 Tab (5 mg total) by mouth Every 6 hours as needed for Pain (with dressing changes, if needed)    Patient not taking:  Reported on 02/07/2018    potassium chloride (K-DUR) 20 mEq Oral Tab Sust.Rel. Particle/Crystal    Take 20 mEq by mouth    prochlorperazine (COMPAZINE) 10 mg Oral Tablet    Take 10 mg by mouth    promethazine (PHENERGAN) 25 mg Oral Tablet    Take 25 mg by mouth          Current Facility-Administered Medications:  cetirizine (ZYRTEC) tablet 10 mg Oral Daily   citalopram (CELEXA) tablet 20 mg Oral Daily   gabapentin (NEURONTIN) capsule 300 mg Oral Daily   glipiZIDE (GLUCOTROL) tablet 5 mg Oral 2x/day AC   heparin 25,000 units in D5W 250 mL infusion 18 Units/kg/hr (Adjusted) Intravenous Continuous   HYDROcodone-acetaminophen (NORCO) 5-325 mg per tablet 1 Tab Oral Q4H PRN   LR premix infusion  Intravenous Continuous  metFORMIN (GLUCOPHAGE) tablet 1,000 mg Oral 2x/day-Food   metoprolol succinate (TOPROL-XL) 24 hr extended release tablet 50 mg Oral Daily   montelukast (SINGULAIR) 10 mg tablet 10 mg Oral QPM   NS bolus infusion 40 mL 40 mL Intravenous Once PRN   NS bolus infusion 40 mL 40 mL Intravenous Once PRN   NS flush syringe 2 mL  Intracatheter Q8HRS   And      NS flush syringe 2-6 mL Intracatheter Q1 MIN PRN   ondansetron (ZOFRAN) tablet 8 mg Oral Q8H PRN   pantoprazole (PROTONIX) delayed release tablet 20 mg Oral Daily   SSIP insulin lispro (HUMALOG) 100 units/mL injection 3-9 Units Subcutaneous 4x/day PRN     Allergies   Allergen Reactions   . Sulfa (Sulfonamides) Itching     Family History  Family Medical History:     Problem Relation (Age of Onset)    Diabetes Mother    Heart Disease Mother      CAD in mom, dementia in dad    Social History  She was a 1 PPD smoker but quit 1995. No rec drug use or etoh use. Lives with sister    REVIEW OF SYSTEMS  Other than ROS in the HPI, all other systems were negative.    EXAM  VITALS:    Temperature: 36.9 C (98.4 F)  Heart Rate: 96  BP (Non-Invasive): (!) 114/52  Respiratory Rate: 18  SpO2: 97 %  Pain Score (Numeric, Faces): 0  Constitutional: appears chronically ill, moderately obese, appears stated age, no distress and vital signs reviewed  Eyes: Conjunctiva clear., Sclera non-icteric.   ENT: Nose without erythema. , Mouth mucous membranes moist.   Neck: no thyromegaly or lymphadenopathy and supple, symmetrical, trachea midline  Respiratory: Clear to auscultation bilaterally.   Cardiovascular: regular rate and rhythm  S1, S2 normal  Gastrointestinal: Soft, non-tender, Bowel sounds normal, No masses L mid quadrant incision c/d/i well healed without discharge  Genitourinary: Deferred  Musculoskeletal: Head atraumatic and normocephalic  Integumentary:  Skin warm and dry and No lesions  Neurologic: Grossly normal  Lymphatic/Immunologic/Hematologic: No lymphadenopathy  Psychiatric: Normal affect, behavior, memory, thought content, judgement, and speech.  R port noted c/d/i      IMAGES: Outside imaging reports from [facility] reviewed    03/2017 MRI of the abdomen: demonstrated a hypoenhancing mass in the pancreatic body and head measuring 3.9 x 2.1 x 3.5 cm extending to the posterior medial pancreatic margin  and abutting the edge of the portal vein and SMV without obvious invasion or wrapping around of the structures. The common bile duct skirts the right lateral margin of the mass is not definitely encased by the mass. There is no common bile duct dilatation. No definite venous invasion of the mass nor wrapping of the venous structures by the mass identified.    03/28/18 CTA chest revealing R lower lobe PE per report    LABS:    I have reviewed all lab results.  Lab Results Today:    Results for orders placed or performed during the hospital encounter of 03/29/18 (from the past 24 hour(s))   BASIC METABOLIC PANEL   Result Value Ref Range    SODIUM 135 (L) 136 - 145 mmol/L    POTASSIUM 3.9 3.5 - 5.1 mmol/L    CHLORIDE 103 96 - 111 mmol/L    CO2 TOTAL 19 (L) 22 - 32 mmol/L    ANION GAP 13 4 - 13 mmol/L    CALCIUM 7.2 (L)  8.5 - 10.2 mg/dL    GLUCOSE 113 65 - 139 mg/dL    BUN 18 8 - 25 mg/dL    CREATININE 1.12 (H) 0.49 - 1.10 mg/dL    BUN/CREA RATIO 16 6 - 22    ESTIMATED GFR 48 (L) >59 mL/min/1.9m^2   MAGNESIUM   Result Value Ref Range    MAGNESIUM 0.9 (LL) 1.6 - 2.5 mg/dL   PHOSPHORUS   Result Value Ref Range    PHOSPHORUS 3.9 2.3 - 4.0 mg/dL   CBC WITH DIFF   Result Value Ref Range    WBC 104.0 (H) 3.7 - 11.0 x10^3/uL    RBC 1.96 (L) 3.85 - 5.22 x10^6/uL    HGB 6.7 (LL) 11.5 - 16.0 g/dL    HCT 19.6 (L) 34.8 - 46.0 %    MCV 100.0 78.0 - 100.0 fL    MCH 34.2 (H) 26.0 - 32.0 pg    MCHC 34.2 31.0 - 35.5 g/dL    RDW-CV 21.7 (H) 11.5 - 15.5 %    PLATELETS 123 (L) 150 - 400 x10^3/uL    MPV 11.9 8.7 - 12.5 fL   ALBUMIN   Result Value Ref Range    ALBUMIN 2.0 (L) 3.4 - 4.8 g/dL   MANUAL DIFF AND MORPHOLOGY-SYSMEX   Result Value Ref Range    NEUTROPHIL % 100 %    LYMPHOCYTE %  0 %    MONOCYTE % 0 %    EOSINOPHIL % 0 %    BASOPHIL % 0 %    NEUTROPHIL BANDS % 1 %    NEUTROPHIL # 105.04 (H) 1.50 - 7.70 x10^3/uL    LYMPHOCYTE # <0.10 (L) 1.00 - 4.80 x10^3/uL    MONOCYTE # <0.10 (L) 0.20 - 1.10 x10^3/uL    EOSINOPHIL # <0.10 <=0.50  x10^3/uL    BASOPHIL # <0.10 <=0.20 x10^3/uL    ANISOCYTOSIS 2+/Moderate (A) None    MACROCYTOSIS 2+/Moderate (A) None   PATH COMMENT   Result Value Ref Range    PATHOLOGIST INTERPRETATION Abnormal Study, See Interpretation (A) Normal Study   HEPATIC FUNCTION PANEL   Result Value Ref Range    ALBUMIN 2.0 (L) 3.4 - 4.8 g/dL    ALKALINE PHOSPHATASE 258 (H) <150 U/L    ALT (SGPT) 13 <55 U/L    AST (SGOT) 25 8 - 41 U/L    BILIRUBIN TOTAL 0.7 0.3 - 1.3 mg/dL    BILIRUBIN DIRECT 0.4 (H) <0.3 mg/dL    PROTEIN TOTAL 3.6 (L) 6.0 - 8.0 g/dL   OSMOLALITY   Result Value Ref Range    OSMOLALITY, BLOOD 284 280 - 300 mOsm/kg   IRON TRANSFERRIN AND TIBC   Result Value Ref Range    TOTAL IRON BINDING CAPACITY 95 (L) 210 - 330 ug/dL    IRON (TRANSFERRIN) SATURATION 91 (H) 20 - 50 %    IRON 86 45 - 170 ug/dL    TRANSFERRIN 68 (L) 160 - 340 mg/dL   FOLATE   Result Value Ref Range    FOLATE 13.2 >=7.0 ng/mL   TYPE AND CROSS RED CELLS - UNITS NON IRRADIATED, 1 Units   Result Value Ref Range    UNITS ORDERED 2     SPECIMEN EXPIRATION DATE 04/01/2018     ABO/RH(D) A POSITIVE     ANTIBODY SCREEN NEGATIVE    BPAM PACKED CELL ORDER   Result Value Ref Range    Coding System [id number]     UNIT NUMBER [id number]  BLOOD COMPONENT TYPE LR RBC, Adsol3, 04730     UNIT DIVISION 00     UNIT DISPENSE STATUS ISSUED     TRANSFUSION STATUS OK TO TRANSFUSE     IS CROSSMATCH Electronically Compatible     Product Code D5329J24     Coding System [id number]     UNIT NUMBER [id number]     BLOOD COMPONENT TYPE LR RBC, Adsol3, 04730     UNIT DIVISION 00     UNIT DISPENSE STATUS ISSUED     TRANSFUSION STATUS OK TO TRANSFUSE     IS CROSSMATCH Electronically Compatible     Product Code L8921J94    CBC   Result Value Ref Range    WBC 97.0 (H) 3.7 - 11.0 x10^3/uL    RBC 1.85 (L) 3.85 - 5.22 x10^6/uL    HGB 6.3 (LL) 11.5 - 16.0 g/dL    HCT 18.5 (L) 34.8 - 46.0 %    MCV 100.0 78.0 - 100.0 fL    MCH 34.1 (H) 26.0 - 32.0 pg    MCHC 34.1 31.0 - 35.5 g/dL    RDW-CV 21.7  (H) 11.5 - 15.5 %    PLATELETS 118 (L) 150 - 400 x10^3/uL    MPV 12.0 8.7 - 12.5 fL   PTT (PARTIAL THROMBOPLASTIN TIME)   Result Value Ref Range    APTT 37.4 24.1 - 38.5 seconds   CREATININE URINE, RANDOM   Result Value Ref Range    CREATININE RANDOM URINE 28 No Reference Range Established mg/dL   UREA NITROGEN, RANDOM URINE   Result Value Ref Range    UREA NITROGEN RANDOM URINE 152 No Reference Range Established mg/dL   OSMOLALITY, RANDOM URINE   Result Value Ref Range    OSMOLALITY URINE 219 50-1,400 mOsm/kg   POC BLOOD GLUCOSE (RESULTS)   Result Value Ref Range    GLUCOSE, POC 116 (H) 70 - 105 mg/dl   HGA1C (HEMOGLOBIN A1C WITH EST AVG GLUCOSE)   Result Value Ref Range    HEMOGLOBIN A1C 7.2 (H) 4.0 - 6.0 %    ESTIMATED AVERAGE GLUCOSE 160 mg/dL   RETICULOCYTE COUNT   Result Value Ref Range    RETICULOCYTE % AUTOMATED 2.74 (H) 0.50 - 2.20 %    RETICULOCYTES COUNT # AUTOMATED 63.0 17.0 - 100.0 x10^3/uL    IMMATURE RETIC FRACTION 12.4 2.7 - 15.9 %    RETICULOCYTE HEMOGLOBIN EQUIVALENT 40.9 (H) 28.0 - 38.0 pg   PTT (PARTIAL THROMBOPLASTIN TIME)   Result Value Ref Range    APTT 38.9 (H) 24.1 - 38.5 seconds   VITAMIN B12   Result Value Ref Range    VITAMIN B 12 >2,000 (H) 200-1,000 pg/mL   POC BLOOD GLUCOSE (RESULTS)   Result Value Ref Range    GLUCOSE, POC 66 (L) 70 - 105 mg/dl   POC BLOOD GLUCOSE (RESULTS)   Result Value Ref Range    GLUCOSE, POC 59 (L) 70 - 105 mg/dl   POC BLOOD GLUCOSE (RESULTS)   Result Value Ref Range    GLUCOSE, POC 58 (L) 70 - 105 mg/dl   POC BLOOD GLUCOSE (RESULTS)   Result Value Ref Range    GLUCOSE, POC 62 (L) 70 - 105 mg/dl   POC BLOOD GLUCOSE (RESULTS)   Result Value Ref Range    GLUCOSE, POC 136 (H) 70 - 105 mg/dl   POC BLOOD GLUCOSE (RESULTS)   Result Value Ref Range    GLUCOSE, POC 158 (H) 70 - 105 mg/dl  Impression/Recommendations:    This is a 83 YOF c PMH pancreatic adenocarcinoma s/p Whipple procedure and currently on adjuvant chemotherapy, presents as a transfer with acute R PE       -Stage 1B adenocarcinoma of the pancreas: s/p whipple's procedure and currently on unidentified adjuvant chemotherapy. Neulasta being used with her cycles.    -R PE: on heparin gtt    -Leukocytosis: likely from recent Neulasta (03/26/18). Giemsa study noted and consistent with this    -Normocytic anemia: no iron studies, folate/B12, reticulocyte count on file. No overt bleeding. May be secondary to renal dysfunction?    -AKI: FeUrea 60.8% suggesting intrinsic renal process (unclear if this was obtained after fluid initiation)    -please obtain reticulocyte count (ideally to be added on if able prior to blood transfusion), iron panel/ferritin (ideally before transfusion)    -please get Vit B12, folate, zinc, copper    -would strongly consider renal ultrasound, renal consult with AKI consistent with intrinsic renal disease and not a pre-renal etiology    -please obtain complete records from patient's current medical oncologist (Dr. Durene Romans office)    -transfuse PRBC if Hgb<7; trend CBC with diff daily    -consider lower extremity doppler US     -agree with heparin gtt     Devoria Albe, MD    I saw and examined the patient.  I reviewed the resident's note.  I agree with the findings and plan of care as documented in the resident's note.  Any exceptions/additions are edited/noted.  Primus Bravo, MD

## 2018-03-29 NOTE — Nurses Notes (Addendum)
First unit of PRBC initiated. VSS stable. Will continue to monitor.

## 2018-03-29 NOTE — Nurses Notes (Signed)
First unit of PRBC completed. No adverse rxns.

## 2018-03-29 NOTE — Care Plan (Signed)
[facility]  Physical Therapy Initial Evaluation    Patient Name: [name]  Date of Birth: 02-Feb-1946  Height: Height: 154.9 cm (5\' 1" )  Weight: Weight: 89.1 kg (196 lb 6.9 oz)  Room/Bed: 979/B  Payor: MEDICARE / Plan: MEDICARE PART A AND B / Product Type: Medicare /     Assessment:      Pt tolerated treatment well. Pt required standby A x 1 for bed mobility and CGA x 1 for sit<-->stand with FWW. Pt required CGA x 1 for amb with FWW and 1 person to follow with chair/equipment. Pt demonstrates impairments in strength, balance, and endurance. PT recommends pt return home with assist and home health once medically stable for D/C.    Discharge Needs:    Equipment Recommendation: none anticipated         Discharge Disposition: home with assist, home with home health    JUSTIFICATION OF DISCHARGE RECOMMENDATION   Based on current diagnosis, functional performance prior to admission, and current functional performance, this patient requires continued PT services in home with assist, home with home health in order to achieve significant functional improvements in these deficit areas: aerobic capacity/endurance, gait, locomotion, and balance.        Plan:   Current Intervention: balance training, bed mobility training, gait training, transfer training, stair training  To provide physical therapy services 1x/day, minimum of 2x/week  for duration of until discharge.    The risks/benefits of therapy have been discussed with the patient/caregiver and he/she is in agreement with the established plan of care.       Subjective & Objective        03/29/18 1110   Therapist Pager   PT Assigned/ Pager # Apolonio Schneiders [phone]/ Jess [phone]   Rehab Session   Document Type evaluation   Total PT Minutes: 21   Patient Effort good   Symptoms Noted During/After Treatment fatigue;shortness of breath   General Information   Patient Profile Reviewed? yes   Onset of Illness/Injury or Date of Surgery 03/29/18      Pertinent History of Current Functional Problem 72 y.o., White female with history of pancreatic adenocarcinoma.  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  On 10/25/17 she underwent a robotic whipple procedure at [facility] with Dr. Cyndi Bender.  Pathologic stage was determined to be T3N0 with negative margins.  Post operative course was complicated by a chyle leak that was treated with bowel rest, TPN, and drainage.  Additionally, patient was readmitted on 11/22/17 for a LLQ surgical wound infection that was treated with Dakins wet to dry dressings and progression to wound vac treatment.  She has recently started chemotherapy again in [place], [place], but is unsure what chemotherapy she takes.  She does know that she is on Neulasta (pegfilgastim) to boost her WBC for chemotherapy.  Her last chemo treatment was on 03/26/18.     Medical Lines Foley Catheter;PIV Line;Telemetry   Respiratory Status nasal cannula   Existing Precautions/Restrictions full code;fall precautions   Mutuality/Individual Preferences   Individualized Care Needs OOB with FWW and assist   Living Environment   Lives With sibling(s);other relative(s) (specify)  (sister and her husband)   Living Arrangements house   Home Assessment: No Problems Identified   Home Accessibility ramps present at home   Functional Level Prior   Ambulation 1 - assistive equipment   Transferring 1 - assistive equipment   Pre Treatment  Status   Pre Treatment Patient Status Patient supine in bed;Call light within reach;Telephone within reach;Sitter select activated;Nurse approved session   Support Present Pre Treatment  None   Communication Pre Treatment  Nurse   Cognitive Assessment/Interventions   Behavior/Mood Observations alert;cooperative   Orientation Status oriented x 4   Attention WNL/WFL   Follows Commands WFL   Vital Signs   Pre SpO2 (%) 100   O2 Delivery Pre Treatment supplemental O2  (2 L)   Post SpO2 (%)    (went as low as 89%, quickly returned to 100%)   O2 Delivery Post Treatment supplemental O2  (2 L)   Pain Assessment   Pre/Post Treatment Pain Comment did not c/o pain   RLE Assessment   RLE Assessment WFL for stated baseline   LLE Assessment   LLE Assessment WFL for stated baseline   Bed Mobility Assessment/Treatment   Bed Mobility, Assistive Device Head of Bed Elevated   Supine-Sit Independence stand-by assistance   Safety Issues decreased use of legs for bridging/pushing;decreased use of arms for pushing/pulling   Impairments coordination impaired;flexibility decreased;ROM decreased;strength decreased   Transfer Assessment/Treatment   Sit-Stand Independence contact guard assist;verbal cues required   Stand-Sit Independence contact guard assist;verbal cues required   Sit-Stand-Sit, Assist Device walker, front wheeled   Transfer Safety Issues balance decreased during turns;step length decreased;weight-shifting ability decreased   Transfer Impairments balance impaired;flexibility decreased;strength decreased   Gait Assessment/Treatment   Independence  contact guard assist;1 person + 1 person to manage equipment   Assistive Device  walker, front wheeled   Distance in Feet 225   Total Distance Ambulated 225   Gait Speed slightly slower than normal   Deviations  cadence decreased;step length decreased;stride length decreased;weight-shifting ability decreased   Safety Issues  balance decreased during turns;step length decreased;weight-shifting ability decreased   Impairments  balance impaired;endurance;strength decreased;flexibility decreased   Balance Skill Training   Sitting Balance: Static good balance   Sitting, Dynamic (Balance) fair + balance   Sit-to-Stand Balance fair balance   Standing Balance: Static fair + balance   Standing Balance: Dynamic fair balance   Systems Impairment Contributing to Balance Disturbance musculoskeletal   Identified Impairments Contributing to Balance Disturbance decreased  strength;decreased ROM   Therapeutic Exercise/Activity   Comment bed mobility, transfers, gait   Post Treatment Status   Post Treatment Patient Status Patient sitting in bedside chair or w/c;Call light within reach;Telephone within reach;Sitter select activated   Support Present Post Treatment  None   Communication Post Treatement Nurse   Plan of Care Review   Plan Of Care Reviewed With patient   Physical Therapy Clinical Impression   Assessment Pt tolerated treatment well. Pt required standby A x 1 for bed mobility and CGA x 1 for sit<-->stand with FWW. Pt required CGA x 1 for amb with FWW and 1 person to follow with chair/equipment. Pt demonstrates impairments in strength, balance, and endurance. PT recommends pt return home with assist and home health once medically stable for D/C.   Criteria for Skilled Therapeutic yes   Impairments Found (describe specific impairments) aerobic capacity/endurance;gait, locomotion, and balance   Functional Limitations in Following  community/leisure;self-care;home management   Disability: Inability to Perform community/leisure   Rehab Potential fair, will monitor progress closely   Therapy Frequency 1x/day;minimum of 2x/week   Predicted Duration of Therapy Intervention (days/wks) until discharge   Anticipated Equipment Needs at Discharge (PT) none anticipated   Anticipated Discharge Disposition home with assist;home with home health  Highest level of Mobility score   Exercise Level 7- Walked 25 feet or more   Evaluation Complexity Justification   Patient History: Co-morbidity/factors that impact Plan of Care 3 or more that impact Plan of Care   Examination Components 4 or more Exam elements addressed   Presentation Evolving: Symptoms, complaints, characteristics of condition changing &/or cognitive deficits present   Clinical Decision Making Moderate complexity   Evaluation Complexity Moderate complexity   Care Plan Goals   PT Rehab Goals Bed Mobility Goal;Gait Training  Goal;Transfer Training Goal   Bed Mobility Goal   Bed Mobility Goal, Date Established 03/29/18   Bed Mobility Goal, Time to Achieve by discharge   Bed Mobility Goal, Activity Type all bed mobility activities   Bed Mobility Goal, Independence Level independent   Gait Training  Goal, Distance to Achieve   Gait Training  Goal, Date Established 03/29/18   Gait Training  Goal, Time to Achieve by discharge   Gait Training  Goal, Independence Level modified independence   Gait Training  Goal, Assist Device walker, rolling   Gait Training  Goal, Distance to Achieve 300   Transfer Training Goal   Transfer Training Goal, Date Established 03/29/18   Transfer Training Goal, Time to Achieve by discharge   Transfer Training Goal, Activity Type all transfers   Transfer Training Goal, Independence Level modified independence   Transfer Training Goal, Assist Device least restrictrictive assistive device   Planned Therapy Interventions, PT Eval   Planned Therapy Interventions (PT) balance training;bed mobility training;gait training;transfer training;stair training       Therapist:   Matilde Sprang, PHYSICAL THERAPY STUDENT   Pager #: [phone]

## 2018-03-29 NOTE — H&P (Signed)
[facility]  Surgery  Admission H&P    [place], [name], 72 y.o. female  Date of Birth:  Nov 23, 1946  Encounter Start Date:  03/29/2018  Inpatient Admission Date: 03/29/2018     Information Obtained from: patient and history reviewed via medical record  Chief Complaint: shortness of breath; headache    PCP: No Pcp     [name] is a 72 y.o., White female with history of pancreatic adenocarcinoma.  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  On 10/25/17 she underwent a robotic whipple procedure at [facility] with Dr. Cyndi Bender.  Pathologic stage was determined to be T3N0 with negative margins.  Post operative course was complicated by a chyle leak that was treated with bowel rest, TPN, and drainage.  Additionally, patient was readmitted on 11/22/17 for a LLQ surgical wound infection that was treated with Dakins wet to dry dressings and progression to wound vac treatment.  She has recently started chemotherapy again in [place], [place], but is unsure what chemotherapy she takes.  She does know that she is on Neulasta (pegfilgastim) to boost her WBC for chemotherapy.  Her last chemo treatment was on 03/26/18.    Today, she [facility] as a [facility] with headache, shortness of breath, and bilious vomiting x1 at [facility].  Patient states that she fell this morning in her home and hit her head when she became dizzy, but has been having the headache and dyspnea more than baseline for a few days.  She has abdominal pain occasionally, but denies currently.  She states that both of her legs have swollen with edema.  Chills present, but no measured fevers or sweats.  Her last bowel movement was 03/28/18.  At the [facility], a CT brain, CTA chest, and CT abd/pelvis were obtained.  Brain imaging was negative for any acute processing, she has a PE in the medial right lower lobe, and small bowel containing ventral hernia below current abdominal mesh with proximal  dilation of small bowel.      Admission Source:  Transfer from another hospital [facility]  Advance Directives:  None-Discussed    Pre-operative Risk Assessment   Not a pre-operative H&P    ROS:  MUST comment on all "Abnormal" findings   ROS Other than ROS in the HPI, all other systems were negative.    PAST MEDICAL/ FAMILY/ SOCIAL HISTORY:       Past Medical History:   Diagnosis Date   . Abdominal hernia 10/18/2017    hx of repair   . Anxiety    . Arthritis    . Asthma    . Atrial fibrillation (CMS HCC)    . Back problem    . Bruises easily    . Cancer (CMS [place]) 10/18/2017    chemo and radiation completed 09/24/2017   . COPD (chronic obstructive pulmonary disease) (CMS HCC)    . CPAP (continuous positive airway pressure) dependence 10/18/2017    has not used recently   . Depression    . DM (diabetes mellitus) (CMS HCC) 2000    Fasting BG 200's. HGA1C 2018 6   . Edema    . Essential hypertension    . GERD (gastroesophageal reflux disease)    . Headache    . Heartburn    . Hx antineoplastic chemo 2018   . Hx of radiation therapy 2018   . Hypercholesteremia    . Hyperlipidemia    .  Migraine 10/18/2017    none recently   . Obesity    . Palpitations    . Pancreatic cancer (CMS [place]) 03/2017   . Panic attack    . Peripheral neuropathy 10/18/2017    knees down, bilateral   . Sleep apnea    . Type 2 diabetes mellitus (CMS HCC) 10/18/2017    Dx 2000 FBS 200s   . Unintentional weight loss 10/18/2017    25 lbs 03/2017   . Wears glasses        Allergies   Allergen Reactions   . Sulfa (Sulfonamides) Itching     Medications Prior to Admission     Prescriptions    aspirin (ECOTRIN) 81 mg Oral Tablet, Delayed Release (E.C.)    Take 81 mg by mouth    atorvastatin (LIPITOR) 10 mg Oral Tablet    Take 10 mg by mouth    cetirizine (ZYRTEC) 10 mg Oral Tablet    Take 10 mg by mouth    citalopram (CELEXA) 20 mg Oral Tablet    Take 20 mg by mouth    furosemide (LASIX) 20 mg Oral Tablet    20 mg Once a day     gabapentin  (NEURONTIN) 300 mg Oral Capsule    300 mg Once a day     glipiZIDE (GLUCOTROL XL) 10 mg Oral Tablet Extended Rel 24 hr (2)    Take 10 mg by mouth    hydroCHLOROthiazide (HYDRODIURIL) 25 mg Oral Tablet    25 mg Once a day     HYDROcodone-acetaminophen (NORCO) 5-325 mg Oral Tablet    Take 1 Tab by mouth Every 4 hours as needed for Pain    lidocaine-prilocaine (EMLA) 2.5-2.5 % Cream    LORazepam (ATIVAN) 0.5 mg Oral Tablet    0.5 mg Once a day     losartan (COZAAR) 100 mg Oral Tablet    Take 100 mg by mouth Once a day     MetFORMIN (GLUCOPHAGE) 1,000 mg Oral Tablet    1,000 mg Twice daily with food     metoprolol succinate (TOPROL-XL) 50 mg Oral Tablet Sustained Release 24 hr    50 mg Once a day     montelukast (SINGULAIR) 10 mg Oral Tablet    Take 10 mg by mouth    omeprazole (PRILOSEC) 40 mg Oral Capsule, Delayed Release(E.C.)    Take 1 Cap (40 mg total) by mouth Once a day    ondansetron (ZOFRAN) 8 mg Oral Tablet    Take 8 mg by mouth Every 8 hours as needed     oxyCODONE (ROXICODONE) 5 mg Oral Tablet    Take 1 Tab (5 mg total) by mouth Every 6 hours as needed for Pain (with dressing changes, if needed)    Patient not taking:  Reported on 02/07/2018    potassium chloride (K-DUR) 20 mEq Oral Tab Sust.Rel. Particle/Crystal    Take 20 mEq by mouth    prochlorperazine (COMPAZINE) 10 mg Oral Tablet    Take 10 mg by mouth    promethazine (PHENERGAN) 25 mg Oral Tablet    Take 25 mg by mouth         Past Surgical History:   Procedure Laterality Date   . CESAREAN SECTION     . HX HERNIA REPAIR     . HX SUBCLAVIAN PORT IMPLANTION      s/p removed   . HX SUBCLAVIAN PORT IMPLANTION     . HX TONSIL AND ADENOIDECTOMY     .  HX TONSILLECTOMY     . UMBILICAL HERNIA REPAIR      x2     Family History:   Family Medical History:     Problem Relation (Age of Onset)    Diabetes Mother    Heart Disease Mother        Social History     Tobacco Use   . Smoking status: Former Smoker     Packs/day: 0.50     Years: 20.00     Pack years: 10.00      Last attempt to quit: 10/18/1993     Years since quitting: 24.4   . Smokeless tobacco: Never Used   Substance Use Topics   . Alcohol use: No   . Drug use: No     PHYSICAL EXAMINATION: MUST comment on all "Abnormal" findings    Exam Temperature: 37.2 C (99 F)  Constitutional:  appears chronically ill and no distress  Eyes:  Conjunctiva clear.  ENT:  ENMT without erythema or injection, mucous membranes moist.  Neck:  supple, symmetrical, trachea midline  Respiratory:  on 2L NC O2 and non labored breathing at rest but some shortness of breath noticable with conversation  Cardiovascular:  regular rate and rhythm  Gastrointestinal:  soft, non distended, non tender to palpation, healed surgical scars, large ventral hernia left to mid abdomen that is reducible  Musculoskeletal:  3+ pitting edema in BL LEs  Integumentary:  Skin warm and dry  Neurologic:  Grossly normal  Psychiatric:  Affect Normal    Labs Ordered/ Reviewed (Please indicate ordered or reviewed)   Reviewed: Labs:  [facility]:   WBC: 111   HGB: 7.1   BNP: 5820   CK:29   Cr: 1.1  Results for orders placed or performed during the hospital encounter of 03/29/18 (from the past 24 hour(s))   CBC/DIFF    Narrative    The following orders were created for panel order CBC/DIFF.  Procedure                               Abnormality         Status                     ---------                               -----------         ------                     CBC WITH DIFF[[id number]]                Abnormal            Preliminary result           Please view results for these tests on the individual orders.   BASIC METABOLIC PANEL   Result Value Ref Range    SODIUM 135 (L) 136 - 145 mmol/L    POTASSIUM 3.9 3.5 - 5.1 mmol/L    CHLORIDE 103 96 - 111 mmol/L    CO2 TOTAL 19 (L) 22 - 32 mmol/L    ANION GAP 13 4 - 13 mmol/L    CALCIUM 7.2 (L) 8.5 - 10.2 mg/dL    GLUCOSE 113 65 - 139 mg/dL    BUN 18 8 - 25 mg/dL    CREATININE  1.12 (H) 0.49 - 1.10 mg/dL    BUN/CREA RATIO 16 6 - 22     ESTIMATED GFR 48 (L) >59 mL/min/1.9m^2   MAGNESIUM   Result Value Ref Range    MAGNESIUM 0.9 (LL) 1.6 - 2.5 mg/dL   PHOSPHORUS   Result Value Ref Range    PHOSPHORUS 3.9 2.3 - 4.0 mg/dL   CBC WITH DIFF   Result Value Ref Range    WBC 104.0 (H) 3.7 - 11.0 x10^3/uL    RBC 1.96 (L) 3.85 - 5.22 x10^6/uL    HGB 6.7 (LL) 11.5 - 16.0 g/dL    HCT 19.6 (L) 34.8 - 46.0 %    MCV 100.0 78.0 - 100.0 fL    MCH 34.2 (H) 26.0 - 32.0 pg    MCHC 34.2 31.0 - 35.5 g/dL    RDW-CV 21.7 (H) 11.5 - 15.5 %    PLATELETS 123 (L) 150 - 400 x10^3/uL    MPV 11.9 8.7 - 12.5 fL    Narrative    HemoglobinTest(s) repeated and results duplicated.   ALBUMIN   Result Value Ref Range    ALBUMIN 2.0 (L) 3.4 - 4.8 g/dL       Ordered: NA    Radiology Tests Ordered/ Reviewed (Please indicate ordered or reviewed)   Reviewed:  At the [facility], a CT brain, CTA chest, and CT abd/pelvis were obtained.  Brain imaging was negative for any acute processing, she has a PE in the medial right lower lobe, and small bowel containing ventral hernia below current abdominal mesh with proximal dilation of small bowel.    Images on [employer]    Ordered: CT c-spine w/o IV contrast       ASSESSMENT & PLAN:      Patient is a 72yo female, admitted on 03/29/18; HD LOS 0 days with PE, fall, and possible sbo with hx of pancreatic adenocarcinoma s/p whipple and recent adjuvant chemotherapy.      Home Meds: listed above   Holding statin until HFP results   Holding diuretics and losartan temporarily due to elevated creatinine     ABX: NA  Diet/fluids: NPO with LR at 75cc/hr  Activity: up with assistance; up to chair  Pain control: home norco  GI prophylaxis: protonix  DVT prophylaxis: heparin drip  Flatus/LBM: 03/28/18  PT/OT ordered/dispo recs: ordered  Lines: PIV and subq port  Drains:foley placed at [facility]    To Do: f/u CT c-spine, transfusing 1u pRBCs due to HGB below 7, urine lytes      DNR Status this admission:  Full Code  Palliative Care consulted?  no    Current Comorbid Conditions -  General Surgery H&P    Respiratory Failure/Other-Not applicable  Not applicable  Arrhythmia Not applicable  Hepatitis-Not applicable  Coagulopathy Coagulopathy due to Medication:  Heparin    DVT/PE Prophylaxis: SCDs/ Venodynes/Impulse boots and Heparin      Tereasa Coop, MD       I saw and examined the patient.  I reviewed the resident's note.  I agree with the findings and plan of care as documented in the resident's note.  Any exceptions/additions are edited/noted.    72 year old female with pancreatic adenocarcinoma s/p robotic whipple around 5 months ago, currently on adjuvant chemotherapy presenting after a fall.  Discovered to have anemia, PE at [facility] and transferred here for further care.  I reviewed her scan and spoke with the transferring provider.  2 units PRBC transfusion here.  Hep gtt  for PE with no obvious bleeding.  Abdominal soft.  Chronic ventral hernia.  Leukocytosis of >100 thought to be due to neulasta.      Lynwood Dawley, MD

## 2018-03-29 NOTE — Care Plan (Signed)
[facility]  Occupational Therapy Initial Evaluation    Patient Name: [name]  Date of Birth: 1946-02-12  Height: Height: 154.9 cm ('5\' 1"' )  Weight: Weight: 89.1 kg (196 lb 6.9 oz)  Room/Bed: 979/B  Payor: MEDICARE / Plan: MEDICARE PART A AND B / Product Type: Medicare /     Assessment:   Pt tolerated Ot eval well.  When pt is medically stable, pt should be able to return home with A and use of FWW  and shower chair  and HHOT.  Ot will continue to followpt while in acute care setting        Discharge Needs:   Equipment Recommendation: none anticipated    Discharge Disposition: home with assist, home with home health  The above recommendation is based upon the current examination and evaluation performed on this date. As subsequent sessions are completed, recommendations will be updated accordingly.  JUSTIFICATION OF DISCHARGE RECOMMENDATION   Based on current diagnosis, functional performance prior to admission, and current functional performance, this patient requires continued OT services in home with assist, home with home health  in order to achieve significant functional improvements.    Plan:   Current Intervention: ADL retraining, IADL retraining, balance training, bed mobility training, endurance training, transfer training    To provide Occupational therapy services 1x/day, minimum of 2x/week, until discharge, until goals are met.       The risks/benefits of therapy have been discussed with the patient/caregiver and he/she is in agreement with the established plan of care.       Subjective & Objective        03/29/18 1112   Therapist Pager   OT Assigned/ Pager # [phone]   Rehab Session   Document Type evaluation   Total OT Minutes: 21   Patient Effort good   Symptoms Noted During/After Treatment fatigue;shortness of breath   General Information   Patient Profile Reviewed? yes   General Observations of Patient pt receptive to OT intervention    Pertinent History of  Current Functional Problem 72 y.o., White female with history of pancreatic adenocarcinoma.  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  On 10/25/17 she underwent a robotic whipple procedure at [facility] with Dr. Cyndi Bender.  Pathologic stage was determined to be T3N0 with negative margins.  Post operative course was complicated by a chyle leak that was treated with bowel rest, TPN, and drainage.  Additionally, patient was readmitted on 11/22/17 for a LLQ surgical wound infection that was treated with Dakins wet to dry dressings and progression to wound vac treatment.  She has recently started chemotherapy again in [place], [place], but is unsure what chemotherapy she takes.  She does know that she is on Neulasta (pegfilgastim) to boost her WBC for chemotherapy.  Her last chemo treatment was on 03/26/18.     Medical Lines Foley Catheter;PIV Line;Telemetry   Respiratory Status nasal cannula  (upon entering room )   Existing Precautions/Restrictions full code;fall precautions   Pre Treatment Status   Pre Treatment Patient Status Patient supine in bed;Call light within reach;Telephone within reach;Sitter select activated   Support Present Pre Treatment  None   Communication Pre Treatment  Nurse   Mutuality/Individual Preferences   Individualized Care Needs OOB with FWW A x1  encourage pt to co mplete own self care tasks    Living Environment   Lives With sibling(s);other (see comments)  (sister and  sisters husband )   [place] Accessibility ramps present at home   Functional Level Prior   Ambulation 1 - assistive equipment   Transferring 1 - assistive equipment   Toileting 1 - assistive equipment   Bathing 1 - assistive equipment   Dressing 0 - independent   Prior Functional Level Comment pts sister completes meal prep and househld tasks    Self-Care   Current Activity Tolerance fair   Equipment Currently Used at Home yes   Equipment Currently  Used at Home walker, rolling   Vital Signs   Pre SpO2 (%) 100   O2 Delivery Pre Treatment supplemental O2  (1.5)   Vitals Comment O2 was removed for mobility  when assessed with pulse ox  pt O 2 level was flucuating from 88% to 98%   O2 donned  returned to 100% quickly    Pain Assessment   Pre/Post Treatment Pain Comment no c/o pain    Coping/Psychosocial Response Interventions   Plan Of Care Reviewed With patient   Cognitive Assessment/Interventions   Behavior/Mood Observations alert;cooperative   Orientation Status oriented x 4   Attention WNL/WFL   Follows Commands WFL   RUE Assessment   RUE Assessment WFL for stated baseline   LUE Assessment   LUE Assessment WFL for stated baseline   RLE Assessment   RLE Assessment WFL for stated baseline   LLE Assessment   LLE Assessment WFL for stated baseline   Mobility Assessment/Training   Mobility Comment pt completed functional mobility with FWW CG ~ 225' than required seated rest break due to fatigue    Bed Mobility Assessment/Treatment   Bed Mobility, Assistive Device Head of Bed Elevated   Supine-Sit Independence stand-by assistance   Safety Issues decreased use of arms for pushing/pulling;decreased use of legs for bridging/pushing   Impairments balance impaired;coordination impaired;endurance;flexibility decreased;ROM decreased;strength decreased   Transfer Assessment/Treatment   Sit-Stand Independence contact guard assist;verbal cues required   Stand-Sit Independence contact guard assist;verbal cues required   Sit-Stand-Sit, Assist Device walker, front wheeled   Transfer Impairments balance impaired;flexibility decreased;strength decreased   Lower Body Dressing Assessment/Training   Comment max A to don socks    IADL Evaluation   IADL Comments pt would require A with meal prep and househld tasks  Sister completes    Information systems manager   Comment with FWW    Sitting Balance: Static good balance   Sitting, Dynamic (Balance) fair + balance   Sit-to-Stand Balance fair  balance   Standing Balance: Static fair + balance   Standing Balance: Dynamic fair balance   Systems Impairment Contributing to Balance Disturbance musculoskeletal   Identified Impairments Contributing to Balance Disturbance decreased strength;decreased ROM   Therapeutic Exercise/Activity   Comment educated pt in balance between challenging self and allowing for rest periods     Post Treatment Status   Post Treatment Patient Status Patient sitting in bedside chair or w/c;Call light within reach;Telephone within reach;Sitter select activated   Financial trader Nurse   Care Plan Goals   OT Rehab Goals Occupational Therapy Goal;Occupational Therapy Goal 2;Bed Mobility Goal;Grooming Goal;LB Dressing Goal;Toileting Goal;Transfer Training Goal   Occupational Therapy Goals   OT Goal, Date Established 03/29/18   OT Goal, Time to Achieve by discharge   OT Goal, Activity Type pt will demonstrate good functional balance to co mplete self care tasks    OT Goal, Independence Level modified independence   Occupational Therapy Goal 2   OT  Goal, Date Established 03/29/18   OT Goal, Time to Achieve by discharge   OT Goal, Activity Type pt will be able to complete funcitonal mobility safely to co mplete self care tasks    OT Goal, Independence Level modified independence   Bed Mobility Goal   Bed Mobility Goal, Date Established 03/29/18   Bed Mobility Goal, Time to Achieve by discharge   Bed Mobility Goal, Activity Type all bed mobility activities   Bed Mobility Goal, Independence Level modified independence   Grooming Goal   Grooming Goal, Date Established 03/29/18   Grooming Goal, Time to Achieve by discharge   Grooming Goal, Activity Type all grooming tasks   Grooming Goal, Independence  modified independence   LB Dressing Goal   LB Dressing Goal, Date Established 03/29/18   LB Dressing Goal, Time to Achieve by discharge   LB Dressing Goal, Activity Type all lower body dressing tasks   LB Dressing Goal, Independence  Level modified independence   Toileting Goal   Toileting Goal, Date Established 03/29/18   Toileting Goal, Time to Achieve by discharge   Toileting Goal, Activity Type all toileting tasks   Toileting Goal, Independence Level modified independence   Transfer Training Goal   Transfer Training Goal, Date Established 03/29/18   Transfer Training Goal, Time to Achieve by discharge   Transfer Training Goal, Activity Type all transfers   Transfer Training Goal, Independence Level modified independence   Planned Therapy Interventions, OT Eval   Planned Therapy Interventions ADL retraining;IADL retraining;balance training;bed mobility training;endurance training;transfer training   Occupational Therapy Clinical Impression   Functional Level at Time of Session Pt tolerated Ot eval well.  When pt is medically stable, pt should be able to return home with A and use of FWW  and shower chair  and HHOT.  Ot will continue to followpt while in acute care setting     Criteria for Skilled Therapeutic Interventions Met (OT) yes;meets criteria;skilled treatment is necessary   Rehab Potential good, to achieve stated therapy goals   Therapy Frequency 1x/day;minimum of 2x/week   Predicted Duration of Therapy until discharge;until goals are met   Anticipated Equipment Needs at Discharge none anticipated   Anticipated Discharge Disposition home with assist;home with home health   Highest level of Mobility score   Exercise Level 7- Walked 25 feet or more   Evaluation Complexity Justification   Occupational Profile Review Expanded review   Performance Deficits Mobility;Balance;Endurance   Clinical Decision Making Moderate analytic complexity   Evaluation Complexity Moderate       Therapist:   Geanie Logan, OT   Pager #: [phone]

## 2018-03-29 NOTE — Nurses Notes (Signed)
Tele notified RN patient had 5 beat run of V. tach.  Patient resting comfortably at this time with no c/o pain, racing HR, or SOB.  Dorina Hoyer on service paged to notify.  Will continue to monitor patient.

## 2018-03-29 NOTE — Nurses Notes (Signed)
Second unit PRBC's started at this time.  Patient resting comfortably. VSS.

## 2018-03-29 NOTE — Nurses Notes (Signed)
Pt had 5 beat run of vtach per telemetry. Dr.Morgan Wynetta Emery notified. Will continue to monitor.

## 2018-03-29 NOTE — Nurses Notes (Signed)
Pt arrived to room 979B from [facility] [place]. Pt oriented to room and surroundings. Call bell at reach. Call bell at reach. Will continue to monitor.

## 2018-03-30 DIAGNOSIS — E669 Obesity, unspecified: Secondary | ICD-10-CM

## 2018-03-30 DIAGNOSIS — N179 Acute kidney failure, unspecified: Secondary | ICD-10-CM

## 2018-03-30 DIAGNOSIS — E119 Type 2 diabetes mellitus without complications: Secondary | ICD-10-CM

## 2018-03-30 LAB — PTT (PARTIAL THROMBOPLASTIN TIME)
APTT: 102 seconds — ABNORMAL HIGH (ref 24.1–38.5)
APTT: 130.7 seconds (ref 24.1–38.5)
APTT: 44.3 seconds — ABNORMAL HIGH (ref 24.1–38.5)
APTT: 54.6 seconds — ABNORMAL HIGH (ref 24.1–38.5)
APTT: >200 seconds (ref 24.1–38.5)

## 2018-03-30 LAB — HEPATIC FUNCTION PANEL
ALBUMIN: 1.7 g/dL — ABNORMAL LOW (ref 3.4–4.8)
ALKALINE PHOSPHATASE: 298 U/L — ABNORMAL HIGH (ref 55–145)
ALKALINE PHOSPHATASE: 298 U/L — ABNORMAL HIGH (ref 55–145)
ALT (SGPT): 12 U/L (ref <55)
AST (SGOT): 30 U/L (ref 8–41)
BILIRUBIN DIRECT: 0.6 mg/dL — ABNORMAL HIGH (ref <0.3)
BILIRUBIN TOTAL: 0.9 mg/dL (ref 0.3–1.3)
PROTEIN TOTAL: 3.4 g/dL — ABNORMAL LOW (ref 6.0–8.0)

## 2018-03-30 LAB — BASIC METABOLIC PANEL
ANION GAP: 9 mmol/L (ref 4–13)
ANION GAP: 9 mmol/L (ref 4–13)
BUN/CREA RATIO: 16 (ref 6–22)
BUN/CREA RATIO: 18 (ref 6–22)
BUN: 15 mg/dL (ref 8–25)
BUN: 15 mg/dL (ref 8–25)
CALCIUM: 7.1 mg/dL — ABNORMAL LOW (ref 8.5–10.2)
CALCIUM: 7.2 mg/dL — ABNORMAL LOW (ref 8.5–10.2)
CHLORIDE: 103 mmol/L (ref 96–111)
CHLORIDE: 102 mmol/L (ref 96–111)
CO2 TOTAL: 22 mmol/L (ref 22–32)
CO2 TOTAL: 22 mmol/L (ref 22–32)
CREATININE: 0.92 mg/dL (ref 0.49–1.10)
CREATININE: 0.85 mg/dL (ref 0.49–1.10)
ESTIMATED GFR: >59 mL/min/1.73mˆ2 (ref >59)
ESTIMATED GFR: >59 mL/min/1.73mˆ2 (ref >59)
GLUCOSE: 120 mg/dL (ref 65–139)
GLUCOSE: 125 mg/dL (ref 65–139)
POTASSIUM: 3.5 mmol/L (ref 3.5–5.1)
POTASSIUM: 4.1 mmol/L (ref 3.5–5.1)
SODIUM: 134 mmol/L — ABNORMAL LOW (ref 136–145)
SODIUM: 133 mmol/L — ABNORMAL LOW (ref 136–145)

## 2018-03-30 LAB — CBC WITH DIFF
HCT: 24.1 % — ABNORMAL LOW (ref 34.8–46.0)
HGB: 8 g/dL — ABNORMAL LOW (ref 11.5–16.0)
MCH: 31.3 pg (ref 26.0–32.0)
MCHC: 33.2 g/dL (ref 31.0–35.5)
MCV: 94.1 fL (ref 78.0–100.0)
MPV: 12.1 fL (ref 8.7–12.5)
PLATELETS: 121 10*3/uL — ABNORMAL LOW (ref 150–400)
RBC: 2.56 x10ˆ6/uL — ABNORMAL LOW (ref 3.85–5.22)
RDW-CV: 20.4 % — ABNORMAL HIGH (ref 11.5–15.5)
WBC: 81.8 x10ˆ3/uL — ABNORMAL HIGH (ref 3.7–11.0)

## 2018-03-30 LAB — POC BLOOD GLUCOSE (RESULTS)
GLUCOSE, POC: 121 mg/dl — ABNORMAL HIGH (ref 70–105)
GLUCOSE, POC: 134 mg/dl — ABNORMAL HIGH (ref 70–105)
GLUCOSE, POC: 149 mg/dl — ABNORMAL HIGH (ref 70–105)
GLUCOSE, POC: 173 mg/dl — ABNORMAL HIGH (ref 70–105)
GLUCOSE, POC: 175 mg/dl — ABNORMAL HIGH (ref 70–105)
GLUCOSE, POC: 97 mg/dl (ref 70–105)

## 2018-03-30 LAB — MANUAL DIFF AND MORPHOLOGY-SYSMEX
BASOPHIL #: <0.1 x10ˆ3/uL (ref <=0.20)
BASOPHIL %: 0 %
EOSINOPHIL #: 1.64 x10ˆ3/uL — ABNORMAL HIGH (ref <=0.50)
EOSINOPHIL %: 2 %
LYMPHOCYTE #: <0.1 x10ˆ3/uL — ABNORMAL LOW (ref 1.00–4.80)
LYMPHOCYTE %: 0 %
METAMYELOCYTE %: 2 %
MONOCYTE #: 0.82 x10ˆ3/uL (ref 0.20–1.10)
MONOCYTE %: 1 %
NEUTROPHIL #: 77.71 x10ˆ3/uL — ABNORMAL HIGH (ref 1.50–7.70)
NEUTROPHIL %: 90 %
NEUTROPHIL BANDS %: 5 %

## 2018-03-30 LAB — PT/INR
INR: 1.43 — ABNORMAL HIGH (ref 0.80–1.20)
PROTHROMBIN TIME: 16.7 seconds — ABNORMAL HIGH (ref 9.5–14.1)

## 2018-03-30 LAB — MAGNESIUM: MAGNESIUM: 1 mg/dL — CL (ref 1.6–2.5)

## 2018-03-30 LAB — PATH COMMENT: PATHOLOGIST INTERPRETATION: ABNORMAL — AB

## 2018-03-30 LAB — PHOSPHORUS: PHOSPHORUS: 3.7 mg/dL (ref 2.3–4.0)

## 2018-03-30 MED ORDER — ACETAMINOPHEN 325 MG TABLET
650.0000 mg | ORAL_TABLET | ORAL | Status: DC | PRN
Start: 2018-03-30 — End: 2018-04-03
  Administered 2018-03-30: 650 mg via ORAL
  Filled 2018-03-30: qty 2

## 2018-03-30 MED ORDER — HEPARIN (PORCINE) 5,000 UNITS/ML BOLUS FOR DOSE ADJUSTMENT
25.0000 [IU]/kg | INTRAMUSCULAR | Status: AC
Start: 2018-03-30 — End: 2018-03-30
  Administered 2018-03-30: 1500 [IU] via INTRAVENOUS
  Filled 2018-03-30: qty 1

## 2018-03-30 MED ORDER — POTASSIUM CHLORIDE ER 20 MEQ TABLET,EXTENDED RELEASE(PART/CRYST)
40.0000 meq | ORAL_TABLET | Freq: Every morning | ORAL | Status: AC
Start: 2018-03-30 — End: 2018-03-30
  Administered 2018-03-30: 40 meq via ORAL
  Filled 2018-03-30: qty 2

## 2018-03-30 MED ORDER — MAGNESIUM SULFATE 2 GRAM/50 ML (4 %) IN WATER INTRAVENOUS PIGGYBACK
2.0000 g | INJECTION | INTRAVENOUS | Status: AC
Start: 2018-03-30 — End: 2018-03-30
  Administered 2018-03-30: 0 g via INTRAVENOUS
  Administered 2018-03-30: 2 g via INTRAVENOUS
  Filled 2018-03-30: qty 50

## 2018-03-30 MED ORDER — ENOXAPARIN 100 MG/ML SUBCUTANEOUS SYRINGE
90.00 mg | INJECTION | Freq: Two times a day (BID) | SUBCUTANEOUS | 0 refills | Status: AC
Start: 2018-03-30 — End: 2018-04-29

## 2018-03-30 MED ORDER — CALCIUM 200 MG (AS CALCIUM CARBONATE 500 MG) CHEWABLE TABLET
1000.0000 mg | CHEWABLE_TABLET | Freq: Three times a day (TID) | ORAL | Status: DC
Start: 2018-03-30 — End: 2018-04-03
  Administered 2018-03-30 – 2018-04-03 (×12): 1000 mg via ORAL
  Filled 2018-03-30 (×13): qty 2

## 2018-03-30 MED ADMIN — lactated Ringers intravenous solution: INTRAVENOUS | @ 07:00:00 | NDC 00338011704

## 2018-03-30 MED ADMIN — artificial tears with lanolin eye ointment: SUBCUTANEOUS | @ 21:00:00 | NDC 17478006235

## 2018-03-30 NOTE — Consults (Signed)
Hematology/Oncology Consult Service   Signoff Note    Date: 03/30/2018  Patient: [name]  MRN: [id number]  Primary Service: SURG ONCOLOGY  Reason for Consult:  Leukocytosis and anemia  ID and Brief HPI:     [name] is a 72 y.o. female who is seen in consultation at the request of the primary team for discussion of treatment for anemia and leukocytosis in the setting of adjuvant chemotherapy in Stage 1B pancreatic cancer. She had received Neulasta 3 days prior to admission as well as chemotherapy.    Workup Completed: Retic index, iron panel, Vit B12, folate    Abnormal Results: Hypoproliferative retic index consistent with recent myelosuppressive chemotherapy.     Pending/proposed Further Workup: None as pt has expected chemotherapy anemia and neulasta leukocytosis (now resolving)     Diagnosis(preliminary/presumptive): Anemia and leukocytosis in the setting of chemo and Neulasta GCSF supportive therapy    Treatment Plan: Continue f/u with Dr. Durene Romans with planned adjuvant chemotherapy. Transfuse 1U PRBC if Hgb<8    Follow-up plan:  Followup with [facility] Primary Oncologist  Dr. Durene Romans. Followup with PCP     Signoff Fellow: Devoria Albe, MD 03/30/2018, 15:39, Pgr # [phone]    I saw and examined the patient.  I reviewed the resident's note.  I agree with the findings and plan of care as documented in the resident's note.  Any exceptions/additions are edited/noted.  Primus Bravo, MD

## 2018-03-30 NOTE — Care Management Notes (Addendum)
[facility] Management Initial Evaluation    Patient Name: [name]  Date of Birth: 05-18-1946  Sex: female  Date/Time of Admission: 03/29/2018 12:44 AM  Room/Bed: 979/B  Payor: MEDICARE / Plan: MEDICARE PART A AND B / Product Type: Medicare /   Primary Care Providers:  Pcp, No (General)    Pharmacy Info:   Preferred Pharmacy     [facility] [phone] - [place], [place] [address] [place] [address]    Phone: [phone] Fax: [phone]    Not a 24 hour pharmacy; exact hours not known    [place], [place]    [place] [address]    Phone: [phone] Fax: [phone]    Not a 24 hour pharmacy; exact hours not known        Emergency Contact Info:   Extended Emergency Contact Information  Primary Emergency Contact: Jim Like  Address: [address]   Johnnette Litter of [place] Phone: [phone]  Relation: Son    History:   [name] is a 72 y.o., female, admitted for PE    Height/Weight: 154.9 cm ('5\' 1"' ) / 89.1 kg (196 lb 6.9 oz)     LOS: 1 day   Admitting Diagnosis: PE (pulmonary thromboembolism) (CMS [place]) [I26.99]    Assessment:      03/30/18 1414   Assessment Details   Assessment Type Admission   Date of Care Management Update 03/30/18   Date of Next DCP Update 04/02/18   Care Management Plan   Discharge Planning Status initial meeting   Projected Discharge Date 03/31/18   CM will evaluate for rehabilitation potential yes   Patient choice offered to patient/family yes   Form for patient choice reviewed/signed and on chart no   Patient aware of possible cost for ambulance transport?  Yes   Discharge Needs Assessment   Equipment Currently Used at Home walker, rolling   Equipment Needed After Discharge none   Discharge Facility/Level of Care Needs Home (Patient/Family Member/other)(code 1)   Transportation Available car;family or friend will provide   Referral  Information   Admission Type inpatient   Address Verified verified-no changes   Arrived From acute hospital, other   Insurance Verified verified-no change   ADVANCE DIRECTIVES   Does the Patient have an Advance Directive? No, Information Offered and Given   Patient Requests Assistance in Having Advance Directive Notarized. No, Will Do Independently   LAY CAREGIVER    Appointed Lay Caregiver? I Decline   Employment/Financial   Patient has Prescription Coverage?  Yes        Name of Insurance Coverage for Medications A[employer] Medicare Advantage    Financial Concerns none   Living Environment   Select an age group to open "lives with" row.  Adult   Lives With sibling(s)   Living Arrangements house   Able to Return to Prior Arrangements yes   Home Safety   Home Assessment: No Problems Identified   Home Accessibility ramps present at home   Legal Issues   Do you have a court appointed guardian/conservator? No   Pt is a 72yrold female with   Past Medical History:   Diagnosis Date   . Abdominal hernia 10/18/2017    hx of repair   . Anxiety    . Arthritis    . Asthma    .  Atrial fibrillation (CMS HCC)    . Back problem    . Bruises easily    . Cancer (CMS [place]) 10/18/2017    chemo and radiation completed 09/24/2017   . COPD (chronic obstructive pulmonary disease) (CMS HCC)    . CPAP (continuous positive airway pressure) dependence 10/18/2017    has not used recently   . Depression    . DM (diabetes mellitus) (CMS HCC) 2000    Fasting BG 200's. HGA1C 2018 6   . Edema    . Essential hypertension    . GERD (gastroesophageal reflux disease)    . Headache    . Heartburn    . Hx antineoplastic chemo 2018   . Hx of radiation therapy 2018   . Hypercholesteremia    . Hyperlipidemia    . Migraine 10/18/2017    none recently   . Obesity    . Palpitations    . Pancreatic cancer (CMS [place]) 03/2017   . Panic attack    . PE (pulmonary thromboembolism) (CMS HCC) 03/29/2018   . Peripheral neuropathy 10/18/2017    knees down, bilateral   . Sleep  apnea    . Type 2 diabetes mellitus (CMS HCC) 10/18/2017    Dx 2000 FBS 200s   . Unintentional weight loss 10/18/2017    25 lbs 03/2017   . Wears glasses    IV fluids, pain management, lab monitoring, vital sign monitoring, imaging performed     Discharge Plan:  Home (Patient/Family Member/other) (code 1)  Patient is a 72yrold female who [place]  Patient reports uses a FWW at home with no issues.  Patient states admission for PE with AKI which has been corrected.  Per service patient is on Heprin drip currently and will need therapeutic Lovenox for discharge.  [place]ierp[place]placed task for prior authorization to CM assistant.  CGerald Stabs PUtahwith Surgical Oncology filled out paperwork and [facility]fax[facility].  Patient states no current HH.  Patent reports Rx coverage through AGuardian Life Insurancewith no copay concerns.  Patient reports PCP Dr. [place]ista Lawmanin [place].  Patient states has a vacation planned this weekend she is hoping to discharge for.  Per service hope for medication authorization and discharge.  CCC to follow.     The patient will continue to be evaluated for developing discharge needs.     2:30pm CCC received telephone call from SOgden CFort Worthassistant who states prior authorization obtained for patient prescription.  [facility] JRoderic Palau JRoderic Palaustates patient has not met her [facility] deductible, patient copay is $583.00.  CEast Bernardupdated patient who states she will have to think about cost and will get back with service.  [place]updated service who states will be rounding soon and will check in with patient if able to afford copay or will need PO Coumadin/bridging.  CCC to follow.     Case Manager: JFrederik Pear RN  Phone: [phone]

## 2018-03-30 NOTE — Care Plan (Signed)
Patient remains admitted for PE and increased SOB.  Per history, patient experienced fall prior to admission--remains on high fall precautions.  Education provided r/t [employer], unit procedures, and POC. Activity encouraged with each interaction. Remains on Heparin gtt--titrated per protocol.  MIVF infusing.  Ambulates to bathroom with staff assist x1 and FWW. Diabetic diet with CC 2200.  Foley d/c'd at 1815--due to void. Weaned to RA-- O2 sats in low to mid 90's, no signs/symptoms of distress. Family at bedside during shift to visit with patient and updated on POC per patient request.    Problem: Adult Inpatient Plan of Care  Goal: Plan of Care Review  Outcome: Ongoing (see interventions/notes)  Flowsheets (Taken 03/30/2018 1624 by Monia Pouch, RN STUDENT)  Plan of Care Reviewed With: patient  Goal: Patient-Specific Goal (Individualization)  Outcome: Ongoing (see interventions/notes)  Flowsheets (Taken 03/30/2018 1838)  Individualized Care Needs: weaned to RA, Foley d/c'd  Goal: Absence of Hospital-Acquired Illness or Injury  Outcome: Ongoing (see interventions/notes)  Goal: Optimal Comfort and Wellbeing  Outcome: Ongoing (see interventions/notes)  Goal: Rounds/Family Conference  Outcome: Ongoing (see interventions/notes)     Problem: Fall Injury Risk  Goal: Absence of Fall and Fall-Related Injury  Outcome: Ongoing (see interventions/notes)     Problem: Skin Injury Risk Increased  Goal: Skin Health and Integrity  Outcome: Ongoing (see interventions/notes)     Problem: Fluid Volume Excess  Goal: Fluid Balance  Outcome: Ongoing (see interventions/notes)     Problem: Adjustment to Illness COPD (Chronic Obstructive Pulmonary Disease)  Goal: Optimal Chronic Illness Coping  Outcome: Ongoing (see interventions/notes)     Problem: Oral Intake Inadequate COPD (Chronic Obstructive Pulmonary Disease)  Goal: Improved Nutrition Intake  Outcome: Ongoing (see interventions/notes)     Problem: Venous Thromboembolism  Goal: VTE  (Venous Thromboembolism) Symptom Resolution  Outcome: Ongoing (see interventions/notes)

## 2018-03-30 NOTE — Care Plan (Signed)
Problem: Adult Inpatient Plan of Care  Goal: Plan of Care Review  Outcome: Ongoing (see interventions/notes)  Goal: Patient-Specific Goal (Individualization)  Outcome: Ongoing (see interventions/notes)     Problem: Skin Injury Risk Increased  Goal: Skin Health and Integrity  Outcome: Ongoing (see interventions/notes)     Problem: Fluid Volume Excess  Goal: Fluid Balance  Outcome: Ongoing (see interventions/notes)     Problem: Skin Injury Risk Increased  Goal: Skin Health and Integrity  Outcome: Ongoing (see interventions/notes)   Alert and oriented. Denies any N/V. Has some abdominal discomforts during movement. On heparin drip, Aptt monitoring done q 6 hrs. All precautions maintained. Call light within reach at all times. Plan of care, and medicines given reviewed with patient.

## 2018-03-30 NOTE — Progress Notes (Signed)
Surgical Oncology Cross-Coverage Note    I received a page from RN regarding [name] at approximately 1905 requesting PRN Tums for heartburn and again at 2136 requesting tylenol for a headache. After reviewing her chart for any contraindications, verbal orders were provided for both medications.     Beverely Low, MD 03/30/2018 22:45  pager# [phone]

## 2018-03-30 NOTE — Progress Notes (Signed)
[employer], 72 y.o. female  Date of Admission:  03/29/2018  Date of Birth:  1946-09-11  Date of Service:  03/30/2018    Subjective: No acute events since admission.  Patient has no complaints.      Vital Signs:  Temp (24hrs) Max:37.7 C (43.3 F)      Systolic (29JJO), ACZ:660 , Min:114 , YTK:160     Diastolic (10XNA), TFT:73, Min:45, Max:65    Temp  Avg: 37.1 C (98.7 F)  Min: 36.5 C (97.7 F)  Max: 37.7 C (99.9 F)  MAP (Non-Invasive)  Avg: 73 mmHG  Min: 65 mmHG  Max: 81 mmHG  Pulse  Avg: 97.5  Min: 89  Max: 102  Resp  Avg: 21.3  Min: 15  Max: 43  SpO2  Avg: 95.1 %  Min: 91 %  Max: 99 %  Pain Score (Numeric, Faces): 0    Current Medications:    Current Facility-Administered Medications:  cetirizine (ZYRTEC) tablet 10 mg Oral Daily   citalopram (CELEXA) tablet 20 mg Oral Daily   gabapentin (NEURONTIN) capsule 300 mg Oral Daily   glipiZIDE (GLUCOTROL) tablet 5 mg Oral 2x/day AC   heparin 25,000 units in D5W 250 mL infusion 18 Units/kg/hr (Adjusted) Intravenous Continuous   HYDROcodone-acetaminophen (NORCO) 5-325 mg per tablet 1 Tab Oral Q4H PRN   LR premix infusion  Intravenous Continuous   magnesium sulfate 2 G in SW 50 mL premix IVPB 2 g Intravenous Now   metFORMIN (GLUCOPHAGE) tablet 1,000 mg Oral 2x/day-Food   metoprolol succinate (TOPROL-XL) 24 hr extended release tablet 50 mg Oral Daily   montelukast (SINGULAIR) 10 mg tablet 10 mg Oral QPM   NS flush syringe 2 mL Intracatheter Q8HRS   And      NS flush syringe 2-6 mL Intracatheter Q1 MIN PRN   ondansetron (ZOFRAN) tablet 8 mg Oral Q8H PRN   pantoprazole (PROTONIX) delayed release tablet 20 mg Oral Daily   potassium chloride (K-DUR) extended release tablet 40 mEq Oral Daily with Breakfast   SSIP insulin lispro (HUMALOG) 100 units/mL injection 3-9 Units Subcutaneous 4x/day PRN       Today's Physical Exam:  Constitutional:  mildly obese and no  distress  Cardiovascular:  regular rate and rhythm  Gastrointestinal:  Soft, non-tender  Musculoskeletal:  bilateral lower extremity edema     I/O:  I/O last 24 hours:      Intake/Output Summary (Last 24 hours) at 03/30/2018 0728  Last data filed at 03/30/2018 0700  Gross per 24 hour   Intake 2027 ml   Output 1745 ml   Net 282 ml     I/O current shift:  04/19 0700 - 04/19 1859  In: 75 [I.V.:75]  Out: -     Nutrition/Residuals:  DIET DIABETIC Calorie amount: CC 1800    Labs  (Please indicate ordered or reviewed)  Reviewed: I have reviewed all labs      Assessment/ Plan:   Active Hospital Problems   (*Primary Problem)    Diagnosis   . PE (pulmonary thromboembolism) (CMS HCC)   1.  Patient currently on heparin drip.  Will work on Biochemist, clinical for therapeutic Lovenox for home.    2.  Appreciate MEd Onc consult   OK for diabetic diet today.  OOB as tolerated.      Idelle Jo, PA-C    I personally saw and examined the patient. See physician's assistant note for additional details.     Appreciate heme/onc recs.  Leukocytosis thought to be related to neulasta.  Responded to PRBCs.  Recommend lovenox.     Lynwood Dawley, MD

## 2018-03-30 NOTE — Nurses Notes (Signed)
Aptt at 2300 = 152.3  Repeat = 130.7  Heparin drip stopped for 30 mins and resumed after, rate decreased to 19 U/kg/hr or 12.2 ml/hr per protocol.

## 2018-03-30 NOTE — Care Plan (Signed)
Patient is a 72yr old female who lives with her sister in a one story house in [place].  Patient reports uses a FWW at home with no issues.  Patient states admission for PE with AKI which has been corrected.  Per service patient is on Heprin drip currently and will need therapeutic Lovenox for discharge.  [place] placed task for prior authorization to CM assistant.  Gerald Stabs, [place] with Surgical Oncology filled out paperwork and [facility] faxed to [facility] for authorization.  Patient states no current HH.  Patent reports Rx coverage through [employer] with no copay concerns.  Patient reports PCP Dr. Vista Lawman in [place], [place] information added to chart.  Patient states has a vacation planned this weekend she is hoping to discharge for.  Per service hope for medication authorization and discharge.  CCC to follow.     The patient will continue to be evaluated for developing discharge needs.

## 2018-03-30 NOTE — Nurses Notes (Signed)
Patient requesting medication for indigestion.  [employer] on service paged to request PRN Tums.

## 2018-03-30 NOTE — Care Plan (Signed)
[facility]  Medical Nutrition Therapy Screen Note                                      Date of Service: 03/30/2018    Reason for Note: Nursing Nutritional Risk notification:  weight loss in the last 3 months or less (inaccurate, as pt's weight loss was intentional and years ago and has been stable and actually increasing)     Reviewed patient status, diet order/TF/TPN, labs and medications.  Height: 154.9 cm (5\' 1" )   Weight: 89.1 kg (196 lb 6.9 oz) (03/28/18 2300)   Body mass index is 37.12 kg/m.    Brief Subjective:   Familiar with patient from previous admissions.  Denied N/V. Patient weighed 285 lbs years ago and intentionally lost weight.  She states her UBW being around 185 lbs now so weight is actually up.  Patient weighed 188 lbs in November and 198 lbs in December so weight has been slowly increasing.      Most Recent Screen   Impaired Nutrition Status Score Impaired Nutrition Status Score: 0 - Normal nutritional status (03/30/18 1000)     Severity of Disease Score Severity of Disease Score: 1 - Chronic patients in particular with acute complications: cirrhosis, COPD, dialysis, oncology, recent bariatric surgery - Mild (03/30/18 1000)     Age Age: 70 - > 70 years (03/30/18 1000)   Score Score: 2 (03/30/18 1000)       Risk Level             Nutrition related problems: None identified at this time    Assessment: No assessment at this time    Monitor: Po status and Tolerance of diet    Plan/Intervention:   Continue current diet.  If appetite starts to decrease, supplement with ensure HP TID  Monitor weekly weights.   Will follow    Dierdre Searles, RD, LD, [place] 03/30/2018, 11:12  Pager [phone]

## 2018-03-31 LAB — HEPATIC FUNCTION PANEL
ALBUMIN: 1.7 g/dL — ABNORMAL LOW (ref 3.4–4.8)
ALKALINE PHOSPHATASE: 258 U/L — ABNORMAL HIGH (ref 55–145)
ALKALINE PHOSPHATASE: 258 U/L — ABNORMAL HIGH (ref 55–145)
ALT (SGPT): 11 U/L (ref <55)
AST (SGOT): 18 U/L (ref 8–41)
BILIRUBIN DIRECT: 0.4 mg/dL — ABNORMAL HIGH (ref <0.3)
BILIRUBIN TOTAL: 0.6 mg/dL (ref 0.3–1.3)
PROTEIN TOTAL: 3.4 g/dL — ABNORMAL LOW (ref 6.0–8.0)

## 2018-03-31 LAB — POC BLOOD GLUCOSE (RESULTS)
GLUCOSE, POC: 66 mg/dl — ABNORMAL LOW (ref 70–105)
GLUCOSE, POC: 97 mg/dl (ref 70–105)
GLUCOSE, POC: 97 mg/dl (ref 70–105)
GLUCOSE, POC: 103 mg/dl (ref 70–105)

## 2018-03-31 LAB — MANUAL DIFF AND MORPHOLOGY-SYSMEX
BASOPHIL #: <0.1 x10ˆ3/uL (ref <=0.20)
BASOPHIL %: 0 %
EOSINOPHIL #: 1.18 x10ˆ3/uL — ABNORMAL HIGH (ref <=0.50)
EOSINOPHIL %: 2 %
LYMPHOCYTE #: 0.59 10*3/uL — ABNORMAL LOW (ref 1.00–4.80)
LYMPHOCYTE %: 1 %
MONOCYTE #: 0.59 x10ˆ3/uL (ref 0.20–1.10)
MONOCYTE %: 1 %
NEUTROPHIL #: 57.33 x10ˆ3/uL — ABNORMAL HIGH (ref 1.50–7.70)
NEUTROPHIL %: 97 %

## 2018-03-31 LAB — BASIC METABOLIC PANEL
ANION GAP: 4 mmol/L (ref 4–13)
BUN/CREA RATIO: 17 (ref 6–22)
BUN: 14 mg/dL (ref 8–25)
CALCIUM: 7.2 mg/dL — ABNORMAL LOW (ref 8.5–10.2)
CHLORIDE: 104 mmol/L (ref 96–111)
CO2 TOTAL: 26 mmol/L (ref 22–32)
CREATININE: 0.82 mg/dL (ref 0.49–1.10)
ESTIMATED GFR: >59 mL/min/1.73mˆ2 (ref >59)
GLUCOSE: 76 mg/dL (ref 65–139)
POTASSIUM: 3.9 mmol/L (ref 3.5–5.1)
SODIUM: 134 mmol/L — ABNORMAL LOW (ref 136–145)

## 2018-03-31 LAB — PT/INR
INR: 1.44 — ABNORMAL HIGH (ref 0.80–1.20)
PROTHROMBIN TIME: 16.8 seconds — ABNORMAL HIGH (ref 9.5–14.1)

## 2018-03-31 LAB — CBC WITH DIFF
HCT: 22.5 % — ABNORMAL LOW (ref 34.8–46.0)
HGB: 7.4 g/dL — ABNORMAL LOW (ref 11.5–16.0)
MCH: 31 pg (ref 26.0–32.0)
MCHC: 32.9 g/dL (ref 31.0–35.5)
MCV: 94.1 fL (ref 78.0–100.0)
MPV: 11.9 fL (ref 8.7–12.5)
PLATELETS: 121 10*3/uL — ABNORMAL LOW (ref 150–400)
RBC: 2.39 x10ˆ6/uL — ABNORMAL LOW (ref 3.85–5.22)
RDW-CV: 20.2 % — ABNORMAL HIGH (ref 11.5–15.5)
WBC: 59.1 x10ˆ3/uL — ABNORMAL HIGH (ref 3.7–11.0)

## 2018-03-31 LAB — PTT (PARTIAL THROMBOPLASTIN TIME)
APTT: 56.3 seconds — ABNORMAL HIGH (ref 24.1–38.5)
APTT: 78.6 seconds — ABNORMAL HIGH (ref 24.1–38.5)

## 2018-03-31 LAB — MAGNESIUM: MAGNESIUM: 1.4 mg/dL — ABNORMAL LOW (ref 1.6–2.5)

## 2018-03-31 LAB — PHOSPHORUS: PHOSPHORUS: 2.8 mg/dL (ref 2.3–4.0)

## 2018-03-31 MED ORDER — SODIUM CHLORIDE 0.9 % IV BOLUS
40.00 mL | INJECTION | Freq: Once | Status: AC | PRN
Start: 2018-03-31 — End: 2018-03-31

## 2018-03-31 MED ORDER — LACTATED RINGERS INTRAVENOUS SOLUTION
INTRAVENOUS | Status: DC
Start: 2018-03-31 — End: 2018-03-31

## 2018-03-31 MED ORDER — HEPARIN (PORCINE) 5,000 UNITS/ML BOLUS FOR DOSE ADJUSTMENT
25.0000 [IU]/kg | Freq: Once | INTRAMUSCULAR | Status: AC
Start: 2018-03-31 — End: 2018-03-31
  Administered 2018-03-31: 1500 [IU] via INTRAVENOUS
  Filled 2018-03-31: qty 1

## 2018-03-31 MED ORDER — MAGNESIUM OXIDE 400 MG (241.3 MG MAGNESIUM) TABLET
400.0000 mg | ORAL_TABLET | Freq: Two times a day (BID) | ORAL | Status: DC
Start: 2018-03-31 — End: 2018-04-03
  Administered 2018-03-31 – 2018-04-03 (×7): 400 mg via ORAL
  Filled 2018-03-31 (×7): qty 1

## 2018-03-31 MED ORDER — MELATONIN 3 MG TABLET
3.00 mg | ORAL_TABLET | Freq: Every evening | ORAL | Status: DC
Start: 2018-03-31 — End: 2018-04-03
  Administered 2018-03-31 – 2018-04-02 (×3): 3 mg via ORAL
  Filled 2018-03-31 (×3): qty 1

## 2018-03-31 MED ORDER — ENOXAPARIN 100 MG/ML SUBCUTANEOUS SYRINGE
1.0000 mg/kg | INJECTION | Freq: Two times a day (BID) | SUBCUTANEOUS | Status: DC
Start: 2018-03-31 — End: 2018-04-03
  Administered 2018-03-31 – 2018-04-03 (×7): 90 mg via SUBCUTANEOUS
  Filled 2018-03-31 (×8): qty 1

## 2018-03-31 MED ADMIN — sodium chloride 0.9 % (flush) injection syringe: @ 12:00:00

## 2018-03-31 MED ADMIN — midazolam 1 mg/mL injection solution: SUBCUTANEOUS | @ 21:00:00

## 2018-03-31 MED ADMIN — methocarbamoL 500 mg tablet: INTRAVENOUS | @ 09:00:00

## 2018-03-31 MED ADMIN — EPINEPHRINE IN NS IRRIGATION: @ 06:00:00 | NDC 00409724101

## 2018-03-31 MED ADMIN — sodium chloride 0.9 % (flush) injection syringe: SUBCUTANEOUS | @ 12:00:00

## 2018-03-31 NOTE — Care Plan (Signed)
Pt aox4. Pt pain managed by prn pain medication. Pt up in the chair several times today. Pt ambulating in the room without SOB. Pt weaned from 2L to RA and O2 sats maintained above 95%. Fall precautions maintained. Pt calling out for all needs. Will continue to monitor.

## 2018-03-31 NOTE — Progress Notes (Signed)
[place], 72 y.o. female  Date of Admission:  03/29/2018  Date of Birth:  10/30/1946  Date of Service:  03/31/2018    Subjective: No acute events overnight.  She states that she feels restless.  Abdominal pain is improved today.  7 bowel movements recorded overnight but patient denies.      Vital Signs:  Temp (24hrs) Max:37.3 C (24.2 F)      Systolic (68TMH), DQQ:229 , Min:96 , NLG:921     Diastolic (19ERD), EYC:14, Min:40, Max:93    Temp  Avg: 36.9 C (98.5 F)  Min: 36.3 C (97.4 F)  Max: 37.3 C (99.1 F)  MAP (Non-Invasive)  Avg: 71.2 mmHG  Min: 57 mmHG  Max: 103 mmHG  Pulse  Avg: 85.2  Min: 77  Max: 94  Resp  Avg: 23.3  Min: 15  Max: 33  SpO2  Avg: 95.4 %  Min: 90 %  Max: 100 %  Pain Score (Numeric, Faces): 10    Current Medications:    Current Facility-Administered Medications:  acetaminophen (TYLENOL) tablet 650 mg Oral Q4H PRN   calcium carbonate (TUMS) chewable tablet 1,000 mg Oral 3x/day-Meals   cetirizine (ZYRTEC) tablet 10 mg Oral Daily   citalopram (CELEXA) tablet 20 mg Oral Daily   enoxaparin PF (LOVENOX) 100 mg/mL SubQ injection 1 mg/kg Subcutaneous Q12H   gabapentin (NEURONTIN) capsule 300 mg Oral Daily   glipiZIDE (GLUCOTROL) tablet 5 mg Oral 2x/day AC   HYDROcodone-acetaminophen (NORCO) 5-325 mg per tablet 1 Tab Oral Q4H PRN   magnesium oxide (MAG-OX) tablet 400 mg Oral 2x/day   metFORMIN (GLUCOPHAGE) tablet 1,000 mg Oral 2x/day-Food   metoprolol succinate (TOPROL-XL) 24 hr extended release tablet 50 mg Oral Daily   montelukast (SINGULAIR) 10 mg tablet 10 mg Oral QPM   NS bolus infusion 40 mL 40 mL Intravenous Once PRN   NS flush syringe 2 mL Intracatheter Q8HRS   And      NS flush syringe 2-6 mL Intracatheter Q1 MIN PRN   ondansetron (ZOFRAN) tablet 8 mg Oral Q8H PRN   pantoprazole (PROTONIX) delayed release tablet 20 mg Oral Daily   SSIP insulin lispro (HUMALOG) 100 units/mL injection 3-9  Units Subcutaneous 4x/day PRN       Today's Physical Exam:  Constitutional:  mildly obese and no distress  Eyes:  Conjunctiva clear.  ENT:  ENMT without erythema or injection, mucous membranes moist.  Neck:  supple, symmetrical, trachea midline  Respiratory:  non labored respriations on 3L NC with O2 sats 100%  Cardiovascular:  regular rate and rhythm  Gastrointestinal:  Soft, non-tender, non-distended  Musculoskeletal:  bilateral lower extremity edema present but improving  Integumentary:  Skin warm and dry  Neurologic:  Grossly normal  Psychiatric:  Affect Normal    I/O:  I/O last 24 hours:      Intake/Output Summary (Last 24 hours) at 03/31/2018 1031  Last data filed at 03/31/2018 1000  Gross per 24 hour   Intake 3287.3 ml   Output 425 ml   Net 2862.3 ml     I/O current shift:  04/20 0700 - 04/20 1859  In: 882.3 [I.V.:882.3]  Out: -     Nutrition/Residuals:  DIET DIABETIC Calorie amount: CC 1800    Labs  (Please indicate ordered or reviewed)  Reviewed: I have reviewed all labs      Assessment/ Plan:   Active Hospital Problems   (*Primary Problem)    Diagnosis   . PE (pulmonary thromboembolism) (CMS HCC)   1.  Patient wants to try out lovenox shots instead of warfarin pills at this time for anticoagulation.  Heparin drip stopped.  Therapeutic lovenox started.  Will work on Biochemist, clinical for therapeutic Lovenox for home.    2.  Appreciate MEd Onc consult   OK for diabetic diet today.  OOB as tolerated.    3. Wean O2 order placed  4. Given 1u pRBCs per Onc recs to transfuse below HGB 8    Tereasa Coop, MD  Department of Surgery, PGY 1  03/31/2018   Pager [phone]      I saw and examined the patient on 03/31/2018.  I reviewed the resident's note.  I agree with the findings and plan of care as documented in the resident's note.  Any exceptions/additions are edited/noted.    Lynwood Dawley, MD

## 2018-03-31 NOTE — Nurses Notes (Signed)
1st unit of blood initiated

## 2018-03-31 NOTE — Nurses Notes (Signed)
BS 103, no coverage needed at this time.

## 2018-03-31 NOTE — Nurses Notes (Signed)
[name] Hoyer MD text paged for patient requesting Melatonin for sleep as this is what she uses at home.  Will await orders.

## 2018-03-31 NOTE — Nurses Notes (Signed)
Unit of blood complete. Pt tolerated fine. No evidence of transfusion reaction.

## 2018-03-31 NOTE — Nurses Notes (Signed)
No evidence of transfusion reactions. Pt vss.  Will continue to monitor.

## 2018-04-01 DIAGNOSIS — R410 Disorientation, unspecified: Secondary | ICD-10-CM

## 2018-04-01 DIAGNOSIS — E162 Hypoglycemia, unspecified: Secondary | ICD-10-CM

## 2018-04-01 LAB — POC BLOOD GLUCOSE (RESULTS)
GLUCOSE, POC: 58 mg/dl — ABNORMAL LOW (ref 70–105)
GLUCOSE, POC: 58 mg/dl — ABNORMAL LOW (ref 70–105)
GLUCOSE, POC: 62 mg/dl — ABNORMAL LOW (ref 70–105)
GLUCOSE, POC: 64 mg/dl — ABNORMAL LOW (ref 70–105)
GLUCOSE, POC: 86 mg/dl (ref 70–105)
GLUCOSE, POC: 96 mg/dl (ref 70–105)
GLUCOSE, POC: 128 mg/dl — ABNORMAL HIGH (ref 70–105)
GLUCOSE, POC: 129 mg/dl — ABNORMAL HIGH (ref 70–105)
GLUCOSE, POC: 129 mg/dl — ABNORMAL HIGH (ref 70–105)
GLUCOSE, POC: 135 mg/dl — ABNORMAL HIGH (ref 70–105)

## 2018-04-01 LAB — MANUAL DIFF AND MORPHOLOGY-SYSMEX
BASOPHIL #: 0.31 x10ˆ3/uL — ABNORMAL HIGH (ref <=0.20)
BASOPHIL %: 1 %
EOSINOPHIL #: 1.24 x10ˆ3/uL — ABNORMAL HIGH (ref <=0.50)
EOSINOPHIL %: 4 %
LYMPHOCYTE #: 0.31 x10ˆ3/uL — ABNORMAL LOW (ref 1.00–4.80)
LYMPHOCYTE %: 1 %
MONOCYTE #: 0.31 x10ˆ3/uL (ref 0.20–1.10)
MONOCYTE %: 1 %
NEUTROPHIL #: 29.55 x10ˆ3/uL — ABNORMAL HIGH (ref 1.50–7.70)
NEUTROPHIL %: 95 %

## 2018-04-01 LAB — BASIC METABOLIC PANEL
ANION GAP: 7 mmol/L (ref 4–13)
BUN/CREA RATIO: 16 (ref 6–22)
BUN: 12 mg/dL (ref 8–25)
CALCIUM: 7.5 mg/dL — ABNORMAL LOW (ref 8.5–10.2)
CHLORIDE: 99 mmol/L (ref 96–111)
CO2 TOTAL: 24 mmol/L (ref 22–32)
CREATININE: 0.77 mg/dL (ref 0.49–1.10)
ESTIMATED GFR: >59 mL/min/1.73mˆ2 (ref >59)
GLUCOSE: 50 mg/dL — ABNORMAL LOW (ref 65–139)
POTASSIUM: 4.1 mmol/L (ref 3.5–5.1)
SODIUM: 130 mmol/L — ABNORMAL LOW (ref 136–145)

## 2018-04-01 LAB — CBC WITH DIFF
HCT: 26.2 % — ABNORMAL LOW (ref 34.8–46.0)
HGB: 8.6 g/dL — ABNORMAL LOW (ref 11.5–16.0)
MCH: 30.6 pg (ref 26.0–32.0)
MCHC: 32.8 g/dL (ref 31.0–35.5)
MCV: 93.2 fL (ref 78.0–100.0)
MPV: 12.3 fL (ref 8.7–12.5)
PLATELETS: 105 x10ˆ3/uL — ABNORMAL LOW (ref 150–400)
RBC: 2.81 10*6/uL — ABNORMAL LOW (ref 3.85–5.22)
RDW-CV: 18.8 % — ABNORMAL HIGH (ref 11.5–15.5)
WBC: 31.1 x10ˆ3/uL — ABNORMAL HIGH (ref 3.7–11.0)

## 2018-04-01 LAB — MAGNESIUM: MAGNESIUM: 1 mg/dL — CL (ref 1.6–2.5)

## 2018-04-01 LAB — PHOSPHORUS: PHOSPHORUS: 3 mg/dL (ref 2.3–4.0)

## 2018-04-01 MED ORDER — MAGNESIUM SULFATE 2 GRAM/50 ML (4 %) IN WATER INTRAVENOUS PIGGYBACK
2.0000 g | INJECTION | Freq: Once | INTRAVENOUS | Status: AC
Start: 2018-04-01 — End: 2018-04-01
  Administered 2018-04-01: 2 g via INTRAVENOUS
  Administered 2018-04-01: 0 g via INTRAVENOUS
  Filled 2018-04-01: qty 50

## 2018-04-01 MED ORDER — FUROSEMIDE 40 MG TABLET
40.0000 mg | ORAL_TABLET | Freq: Two times a day (BID) | ORAL | Status: DC
Start: 2018-04-01 — End: 2018-04-03
  Administered 2018-04-01 – 2018-04-03 (×4): 40 mg via ORAL
  Filled 2018-04-01 (×5): qty 1

## 2018-04-01 MED ORDER — NYSTATIN 100,000 UNIT/GRAM TOPICAL CREAM
TOPICAL_CREAM | Freq: Two times a day (BID) | CUTANEOUS | Status: DC
Start: 2018-04-01 — End: 2018-04-03
  Administered 2018-04-02: 0 via TOPICAL
  Filled 2018-04-01 (×2): qty 15

## 2018-04-01 MED ADMIN — glipiZIDE 5 mg tablet: ORAL | @ 07:00:00

## 2018-04-01 MED ADMIN — sodium chloride 0.9 % (flush) injection syringe: ORAL | @ 13:00:00

## 2018-04-01 NOTE — Nurses Notes (Signed)
Pt is on Lasix at home, she states that her Primary just increased the dose to 40 mg a day.  Will let attending know and see if they wish to continue this order.

## 2018-04-01 NOTE — Nurses Notes (Signed)
Pt witnessed to be sleeping at this moment.  Will monitor vital signs for changes for possible pain.

## 2018-04-01 NOTE — Nurses Notes (Signed)
Darrin Luis MD Text paged for low sugar of 58 and magnesium of 1.0.    Asking to hold the Glipizide.

## 2018-04-01 NOTE — Progress Notes (Signed)
[name], [name], 72 y.o. female  Date of Admission:  03/29/2018  Date of Birth:  05-31-1946  Date of Service:  04/01/2018    Subjective: Patient received 1u pRBCs yesterday for HGB under 8 per heme onc recommendations.  She had some mild hypoglycemia this morning with delerium, but was easily reoriented.      Vital Signs:  Temp (24hrs) Max:37 C (19.6 F)      Systolic (22WLN), LGX:211 , Min:108 , HER:740     Diastolic (81KGY), JEH:63, Min:48, Max:67    Temp  Avg: 36.7 C (98.1 F)  Min: 36.5 C (97.7 F)  Max: 37 C (98.6 F)  MAP (Non-Invasive)  Avg: 74.4 mmHG  Min: 68 mmHG  Max: 84 mmHG  Pulse  Avg: 82.7  Min: 72  Max: 88  Resp  Avg: 19.1  Min: 11  Max: 33  SpO2  Avg: 94.4 %  Min: 91 %  Max: 99 %  Pain Score (Numeric, Faces): 0    Current Medications:    Current Facility-Administered Medications:  acetaminophen (TYLENOL) tablet 650 mg Oral Q4H PRN   calcium carbonate (TUMS) chewable tablet 1,000 mg Oral 3x/day-Meals   cetirizine (ZYRTEC) tablet 10 mg Oral Daily   citalopram (CELEXA) tablet 20 mg Oral Daily   enoxaparin PF (LOVENOX) 100 mg/mL SubQ injection 1 mg/kg Subcutaneous Q12H   gabapentin (NEURONTIN) capsule 300 mg Oral Daily   glipiZIDE (GLUCOTROL) tablet 5 mg Oral 2x/day AC   HYDROcodone-acetaminophen (NORCO) 5-325 mg per tablet 1 Tab Oral Q4H PRN   magnesium oxide (MAG-OX) tablet 400 mg Oral 2x/day   melatonin tablet 3 mg Oral QPM   metFORMIN (GLUCOPHAGE) tablet 1,000 mg Oral 2x/day-Food   metoprolol succinate (TOPROL-XL) 24 hr extended release tablet 50 mg Oral Daily   montelukast (SINGULAIR) 10 mg tablet 10 mg Oral QPM   NS flush syringe 2 mL Intracatheter Q8HRS   And      NS flush syringe 2-6 mL Intracatheter Q1 MIN PRN   ondansetron (ZOFRAN) tablet 8 mg Oral Q8H PRN   pantoprazole (PROTONIX) delayed release tablet 20 mg Oral Daily   SSIP insulin lispro (HUMALOG) 100 units/mL injection 3-9 Units  Subcutaneous 4x/day PRN       Today's Physical Exam:  Constitutional:  mildly obese and no distress  Eyes:  Conjunctiva clear.  ENT:  ENMT without erythema or injection, mucous membranes moist.  Neck:  supple, symmetrical, trachea midline  Respiratory:  non labored respriations on 3L NC with O2 sats 100%  Cardiovascular:  regular rate and rhythm  Gastrointestinal:  Soft, non-tender, non-distended  Musculoskeletal:  bilateral lower extremity edema present but improving  Integumentary:  Skin warm and dry  Neurologic:  Grossly normal  Psychiatric:  Affect Normal    I/O:  I/O last 24 hours:      Intake/Output Summary (Last 24 hours) at 04/01/2018 1145  Last data filed at 04/01/2018 0900  Gross per 24 hour   Intake 477 ml   Output --   Net 477 ml     I/O current shift:  04/21 0700 - 04/21 1859  In: 300 [P.O.:240; I.V.:60]  Out: -     Nutrition/Residuals:  DIET DIABETIC Calorie  amount: CC 1800    Labs  (Please indicate ordered or reviewed)  Reviewed: I have reviewed all labs      Assessment/ Plan:   Active Hospital Problems   (*Primary Problem)    Diagnosis   . PE (pulmonary thromboembolism) (CMS HCC)   -Patient wants to try out lovenox shots instead of warfarin pills at this time for anticoagulation.  Heparin drip stopped.  Therapeutic lovenox started.  Will work on Biochemist, clinical for therapeutic Lovenox for home.    - Appreciate MEd Onc consult   Continue diabetic diet today.  OOB as tolerated.    Restarting home lasix 40mg  PO bid  - stop tele    Tereasa Coop, MD  Department of Surgery, PGY 1  04/01/2018   Pager [phone]      I saw and examined the patient on 04/01/2018.  I reviewed the resident's note.  I agree with the findings and plan of care as documented in the resident's note.  Any exceptions/additions are edited/noted.    Lynwood Dawley, MD

## 2018-04-01 NOTE — Nurses Notes (Signed)
Assessed Pt, noted in flow sheets. Med's administered per Mar. VS stable except for being tachycardic. Patient has no complaint of V/C. Pt is nauseas and had diarrhea this morning. Crackers and ice water given per pt request. Pt's pain 0/10. Will continue to monitor.

## 2018-04-01 NOTE — Nurses Notes (Signed)
Pt found to be pulling off heart leads, and pulled out the access to her chest port, attempting to pull out IV to her right ac.  The patient was asked who she was, the date, the year, the president all of which she verbalized,  She did not however know her location.  She was reoriented to the surroundings and she verbalized understanding.  The pt felt warm to the touch however her temp was 36.8C.  She was complaining of leg discomfort and said it was from them being swollen.  A cool cloth was applied to her forehead and she was instructed on exercises for her legs.  This did seem to help the patient slightly.  She was reminded again about using the call light for help when she needed something.  She was able to verbalize understanding.  Will continue to monitor the patient for confusion.

## 2018-04-01 NOTE — Nurses Notes (Signed)
Recheck FS 62 patient, Dietary called for 15GM fast carb spoke to [place].  The patient does appear to be coming around more and more.

## 2018-04-02 DIAGNOSIS — I491 Atrial premature depolarization: Secondary | ICD-10-CM

## 2018-04-02 DIAGNOSIS — R9431 Abnormal electrocardiogram [ECG] [EKG]: Secondary | ICD-10-CM

## 2018-04-02 LAB — ECG 12-LEAD
Atrial Rate: 68 {beats}/min
Calculated P Axis: 58 degrees
Calculated T Axis: 99 degrees
PR Interval: 182 ms
QRS Duration: 88 ms
QT Interval: 418 ms
QTC Calculation: 444 ms
Ventricular rate: 68 {beats}/min

## 2018-04-02 LAB — BASIC METABOLIC PANEL
ANION GAP: 4 mmol/L (ref 4–13)
BUN/CREA RATIO: 12 (ref 6–22)
BUN: 9 mg/dL (ref 8–25)
CALCIUM: 7.6 mg/dL — ABNORMAL LOW (ref 8.5–10.2)
CALCIUM: 7.6 mg/dL — ABNORMAL LOW (ref 8.5–10.2)
CHLORIDE: 100 mmol/L (ref 96–111)
CO2 TOTAL: 27 mmol/L (ref 22–32)
CREATININE: 0.74 mg/dL (ref 0.49–1.10)
ESTIMATED GFR: >59 mL/min/{1.73_m2} (ref >59)
GLUCOSE: 107 mg/dL (ref 65–139)
POTASSIUM: 4.1 mmol/L (ref 3.5–5.1)
SODIUM: 131 mmol/L — ABNORMAL LOW (ref 136–145)

## 2018-04-02 LAB — CBC WITH DIFF
HCT: 28.4 % — ABNORMAL LOW (ref 34.8–46.0)
HGB: 9.3 g/dL — ABNORMAL LOW (ref 11.5–16.0)
MCH: 30.6 pg (ref 26.0–32.0)
MCHC: 32.7 g/dL (ref 31.0–35.5)
MCV: 93.4 fL (ref 78.0–100.0)
PLATELETS: 86 x10ˆ3/uL — ABNORMAL LOW (ref 150–400)
RBC: 3.04 x10ˆ6/uL — ABNORMAL LOW (ref 3.85–5.22)
RDW-CV: 18.8 % — ABNORMAL HIGH (ref 11.5–15.5)
WBC: 32.8 x10ˆ3/uL — ABNORMAL HIGH (ref 3.7–11.0)

## 2018-04-02 LAB — BPAM PACKED CELL ORDER
UNIT DIVISION: 0
UNIT DIVISION: 0
UNIT DIVISION: 0
UNIT DIVISION: 0

## 2018-04-02 LAB — MAGNESIUM: MAGNESIUM: 1.2 mg/dL — ABNORMAL LOW (ref 1.6–2.5)

## 2018-04-02 LAB — MANUAL DIFF AND MORPHOLOGY-SYSMEX
BASOPHIL #: 0.33 x10ˆ3/uL — ABNORMAL HIGH (ref <=0.20)
BASOPHIL %: 1 %
EOSINOPHIL #: 1.31 x10ˆ3/uL — ABNORMAL HIGH (ref <=0.50)
EOSINOPHIL %: 4 %
LYMPHOCYTE #: 0.33 x10ˆ3/uL — ABNORMAL LOW (ref 1.00–4.80)
LYMPHOCYTE %: 1 %
LYMPHOCYTE %: 1 %
MONOCYTE #: 0.66 x10ˆ3/uL (ref 0.20–1.10)
MONOCYTE %: 2 %
NEUTROPHIL #: 30.18 x10ˆ3/uL — ABNORMAL HIGH (ref 1.50–7.70)
NEUTROPHIL %: 92 %

## 2018-04-02 LAB — POC BLOOD GLUCOSE (RESULTS)
GLUCOSE, POC: 60 mg/dl — ABNORMAL LOW (ref 70–105)
GLUCOSE, POC: 119 mg/dl — ABNORMAL HIGH (ref 70–105)
GLUCOSE, POC: 101 mg/dl (ref 70–105)
GLUCOSE, POC: 95 mg/dl (ref 70–105)

## 2018-04-02 LAB — TYPE AND CROSS RED CELLS - UNITS
ABO/RH(D): A POS
ANTIBODY SCREEN: NEGATIVE
UNITS ORDERED: 4

## 2018-04-02 LAB — PHOSPHORUS: PHOSPHORUS: 3 mg/dL (ref 2.3–4.0)

## 2018-04-02 MED ADMIN — nystatin 100,000 unit/gram topical powder: ORAL | @ 09:00:00 | NDC 68308015215

## 2018-04-02 NOTE — Nurses Notes (Signed)
Pt stable and laying in bed. Pt assessment and vitals per flow sheet. Pt denied pain at this time. Fall precautions maintained. Call bell placed within reach. Room organization maintained. Encourage to call out when assistance is needed. Will continue to monitor pt.

## 2018-04-02 NOTE — Nurses Notes (Signed)
Pt transferred to 958. Pt ambulated to room with walker and without issue. Pt settled in room and bedside report given to receiving nurse.

## 2018-04-02 NOTE — Nurses Notes (Signed)
Pt's son called with concerns over pt's d/c and would like to speak with [employer]. However, pt family member was on their way to work and wold like [facility] to speak with their wife, Stanton Kidney [phone]). [place] Opal Sidles) called with this information. CCC to call pt family member

## 2018-04-02 NOTE — Care Plan (Signed)
[facility]  Occupational Therapy Progress Note    Patient Name: [name]  Date of Birth: 07-05-1946  Height:  154.9 cm (5\' 1" )  Weight:  94.1 kg (207 lb 7.3 oz)  Room/Bed: 979/B  Payor: MEDICARE / Plan: MEDICARE PART A AND B / Product Type: Medicare /     Assessment:    Pt tolerated OT treatment session fairly well.  Pt presents wit a decrease infunctional status due to decreased activity tolerance, decreased balance, decreased transfer/mobility status.  Pt required CG for safety with peri care and hand washing sink level   Pt would beneift from further OT intervention and will be seen whil in acute care setting.  Since pt will not have 24/7 A at home upon DC rec inpt rehab to promote max level         Discharge Needs:   Equipment Recommendation: none anticipated    Discharge Disposition:  inpatient rehabilitation facility   The above recommendation is based upon the current examination and evaluation performed on this date. As subsequent sessions are completed, recommendations will be updated accordingly.  JUSTIFICATION OF DISCHARGE RECOMMENDATION   Based on current diagnosis, functional performance prior to admission, and current functional performance, this patient requires continued OT services in inpatient rehabilitation facility in order to achieve significant functional improvements.    Plan:   Continue to follow patient according to established plan of care.  The risks/benefits of therapy have been discussed with the patient/caregiver and he/she is in agreement with the established plan of care.     Subjective & Objective:        04/02/18 1048   Therapist Pager   OT Assigned/ Pager # [phone]   Rehab Session   Document Type therapy progress note (daily note)   Total OT Minutes: 19   Patient Effort good   Symptoms Noted During/After Treatment fatigue   General Information   Patient Profile Reviewed? yes   General Observations of Patient pt receptive to OT intervention     Pertinent History of Current Functional Problem 72 y.o., White female with history of pancreatic adenocarcinoma.  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  On 10/25/17 she underwent a robotic whipple procedure at [facility] with Dr. Cyndi Bender.  Pathologic stage was determined to be T3N0 with negative margins.  Post operative course was complicated by a chyle leak that was treated with bowel rest, TPN, and drainage.  Additionally, patient was readmitted on 11/22/17 for a LLQ surgical wound infection that was treated with Dakins wet to dry dressings and progression to wound vac treatment.  She has recently started chemotherapy again in [place], [place], but is unsure what chemotherapy she takes.  She does know that she is on Neulasta (pegfilgastim) to boost her WBC for chemotherapy.  Her last chemo treatment was on 03/26/18.     Medical Lines PIV Line;Telemetry   Respiratory Status room air   Existing Precautions/Restrictions full code;fall precautions   Pre Treatment Status   Pre Treatment Patient Status Patient supine in bed;Call light within reach;Telephone within reach;Nurse approved session;Sitter select activated   Communication Pre Treatment  Nurse   Mutuality/Individual Preferences   Individualized Care Needs OOB with FWW a x1  encourage pt to complete own self care tasks    Self-Care   Current Activity Tolerance moderate   Vital Signs   Vitals Comment stable on RA    Pain Assessment  Pre/Post Treatment Pain Comment no c/o pain    Coping/Psychosocial Response Interventions   Plan Of Care Reviewed With patient   Cognitive Assessment/Interventions   Behavior/Mood Observations lethargic;cooperative   Attention WNL/WFL   Follows Commands follows one step commands;repetition of directions required;verbal cues/prompting required;physical/tactile prompts required   Comment pt was sleeping upon entering room    Mobility Assessment/Training   Mobility Comment pt  completed functional mobility ~15 ' bed to bathroom    Bed Mobility Assessment/Treatment   Bed Mobility, Assistive Device bed rails;Head of Bed Elevated   Supine-Sit Independence minimum assist (75% patient effort);verbal cues required   Safety Issues decreased use of legs for bridging/pushing;decreased use of arms for pushing/pulling   Impairments endurance;flexibility decreased;pain;ROM decreased;strength decreased   Transfer Assessment/Treatment   Sit-Stand Independence contact guard assist   Stand-Sit Independence contact guard assist   Sit-Stand-Sit, Assist Device walker, front wheeled   Toilet Transfer Independence contact guard assist   Toilet Transfer Assist Device walker, front wheeled   Transfer Impairments balance impaired;flexibility decreased;strength decreased;endurance   Upper Body Dressing Assessment/Training   Comment minA to change soiled gown    Toileting Assessment/Training   Comment pt was able to co mplete peri care in standing with CG and support of grab bar    Grooming Assessment/Training   Comment hand washing sink level with CG for safety    IADL Evaluation   IADL Comments pt would not be able to safely care for self throughout the day    Balance Skill Training   Comment with FWW    Sitting Balance: Static good balance   Sitting, Dynamic (Balance) fair + balance   Sit-to-Stand Balance fair balance   Standing Balance: Static fair + balance   Standing Balance: Dynamic fair balance   Systems Impairment Contributing to Balance Disturbance musculoskeletal   Identified Impairments Contributing to Balance Disturbance decreased strength;decreased ROM   Post Treatment Status   Post Treatment Patient Status Patient sitting in bedside chair or w/c;Call light within reach;Telephone within reach;Sitter select activated   Environmental manager   Occupational Therapy Clinical Impression   Functional Level at Time of Session Pt tolerated OT treatment session fairly well.  Pt presents wit a  decrease infunctional status due to decreased activity tolerance, decreased balance, decreased transfer/mobility status.  Pt required CG for safety with peri care and hand washing sink level   Pt would beneift from further OT intervention and will be seen whil in acute care setting.  Since pt will not have 24/7 A at home upon DC rec inpt rehab to promote max level      Anticipated Equipment Needs at Discharge none anticipated   Anticipated Discharge Disposition inpatient rehabilitation facility   Highest level of Mobility score   Exercise Level 6- Walked 10 steps or more       Therapist:   Geanie Logan, OT  Pager #: [phone]

## 2018-04-02 NOTE — Nurses Notes (Signed)
Pt. arrived from [facility] Stepdown to 958. Patient setup & oriented to room and Bed Controls/Nurse Button. VSS. Belongings sheet checked by CA. Transfer arrival completed and orders released. Will continue to monitor.

## 2018-04-02 NOTE — Progress Notes (Signed)
[place] NOTE    [name], [name], 72 y.o. female  Date of Admission:  03/29/2018  Date of Birth:  09/14/1946  Date of Service:  04/02/2018    Subjective: NAE overnight.  Patient has no new complaints this morning.       Vital Signs:  Temp (24hrs) Max:36.9 C (47.6 F)      Systolic (54YTK), PTW:656 , Min:97 , CLE:751     Diastolic (70YFV), CBS:49, Min:46, Max:60    Temp  Avg: 36.6 C (97.9 F)  Min: 36.4 C (97.5 F)  Max: 36.9 C (98.4 F)  MAP (Non-Invasive)  Avg: 69 mmHG  Min: 57 mmHG  Max: 74 mmHG  Pulse  Avg: 69.5  Min: 67  Max: 72  Resp  Avg: 22  Min: 16  Max: 33  SpO2  Avg: 95.3 %  Min: 93 %  Max: 97 %  Pain Score (Numeric, Faces): 0    Current Medications:    Current Facility-Administered Medications:  acetaminophen (TYLENOL) tablet 650 mg Oral Q4H PRN   calcium carbonate (TUMS) chewable tablet 1,000 mg Oral 3x/day-Meals   cetirizine (ZYRTEC) tablet 10 mg Oral Daily   citalopram (CELEXA) tablet 20 mg Oral Daily   enoxaparin PF (LOVENOX) 100 mg/mL SubQ injection 1 mg/kg Subcutaneous Q12H   furosemide (LASIX) tablet 40 mg Oral 2x/day   gabapentin (NEURONTIN) capsule 300 mg Oral Daily   glipiZIDE (GLUCOTROL) tablet 5 mg Oral 2x/day AC   HYDROcodone-acetaminophen (NORCO) 5-325 mg per tablet 1 Tab Oral Q4H PRN   magnesium oxide (MAG-OX) tablet 400 mg Oral 2x/day   melatonin tablet 3 mg Oral QPM   metFORMIN (GLUCOPHAGE) tablet 1,000 mg Oral 2x/day-Food   metoprolol succinate (TOPROL-XL) 24 hr extended release tablet 50 mg Oral Daily   montelukast (SINGULAIR) 10 mg tablet 10 mg Oral QPM   NS flush syringe 2 mL Intracatheter Q8HRS   And      NS flush syringe 2-6 mL Intracatheter Q1 MIN PRN   nystatin (MYCOSTATIN) 100,000 units/g topical cream  Apply Topically 2x/day   ondansetron (ZOFRAN) tablet 8 mg Oral Q8H PRN   pantoprazole (PROTONIX) delayed release tablet 20 mg Oral Daily   SSIP insulin lispro (HUMALOG) 100 units/mL injection 3-9  Units Subcutaneous 4x/day PRN       Today's Physical Exam:  Constitutional:  mildly obese and no distress  Respiratory:  non labored respriations on 3L NC with O2 sats 100%  Cardiovascular:  regular rate and rhythm  Gastrointestinal:  Soft, non-tender, non-distended  Musculoskeletal:  bilateral lower extremity edema present   Integumentary:  Skin warm and dry  Neurologic:  Grossly normal  Psychiatric:  Affect Normal    I/O:  I/O last 24 hours:      Intake/Output Summary (Last 24 hours) at 04/02/2018 0901  Last data filed at 04/02/2018 0453  Gross per 24 hour   Intake 430 ml   Output --   Net 430 ml     I/O current shift:  No intake/output data recorded.    Nutrition/Residuals:  DIET DIABETIC Calorie amount: CC 1800    Labs  (Please indicate ordered or reviewed  AM labs ordered, but have not been drawn yet.  Contacted nurse to request labs  to be drawn      Assessment/ Plan:   Active Hospital Problems   (*Primary Problem)    Diagnosis   . PE (pulmonary thromboembolism) (CMS HCC)   -Patient wants to try out lovenox shots instead of warfarin pills at this time for anticoagulation.  Heparin drip stopped.  Therapeutic lovenox started.  Will work on Biochemist, clinical for therapeutic Lovenox for home.    - Appreciate MEd Onc consult  - will transfuse 1 unit PRBCs if Hbg drops below 8. Patient should f/u with her primary oncologist, Dr. Durene Romans when discharged  Continue diabetic diet today.  OOB as tolerated.    Restarting home lasix 40mg  PO bid    Idelle Jo, PA-C  04/02/2018, 09:08    I personally saw and examined the patient. See physician's assistant note for additional details.     Lynwood Dawley, MD

## 2018-04-02 NOTE — Care Plan (Signed)
plan of care reviewed with pt and pt verbalized understanding. Optimal comfort and well-being promoted by adjusting room temperature upon request. Pt free from falls this shift through promotion and maintenance of falls prevention program. Skin health and integrity maintained by encouraging frequent weight shift change and changing of incontinence pad. Optimal level of functional independence promoted by allowing pt to ambulate to BR with minimal assistance and walker and by keeping frequently used objects within reach. Nurse will continue to monitor pt and work towards goals.

## 2018-04-02 NOTE — Nurses Notes (Signed)
[address]-[place] 979B has been NSR with periods of ventricle trigeminy (alpha page to service)

## 2018-04-02 NOTE — Care Plan (Signed)
[facility]  Physical Therapy Progress Note      Patient Name: [name]  Date of Birth: 17-Apr-1946  Height:  154.9 cm (5\' 1" )  Weight:  94.1 kg (207 lb 7.3 oz)  Room/Bed: 979/B  Payor: MEDICARE / Plan: MEDICARE PART A AND B / Product Type: Medicare /     Assessment:     Pt tolerated treatment adequately. Pt required min A x 1 for bed mobility and CGA x 1 for sit<-->stand from bed and toilet and amb with FWW. Pt amb 15' in the room with FWW and CGA x 1. Pt demonstrates impairments in strength, balance, and endurance. PT recommends inpatient rehabilitation secondary to family at home being out of town and unable to assist if pt were to be D/C at this time.    Discharge Needs:   Equipment Recommendation: none anticipated        Discharge Disposition: inpatient rehabilitation facility    JUSTIFICATION OF DISCHARGE RECOMMENDATION   Based on current diagnosis, functional performance prior to admission, and current functional performance, this patient requires continued PT services in inpatient rehabilitation facility in order to achieve significant functional improvements in these deficit areas: aerobic capacity/endurance, gait, locomotion, and balance.      Plan:   Continue to follow patient according to established plan of care.  The risks/benefits of therapy have been discussed with the patient/caregiver and he/she is in agreement with the established plan of care.     Subjective & Objective:        04/02/18 1047   Therapist Pager   PT Assigned/ Pager # [phone]   Rehab Session   Document Type therapy progress note (daily note)   Total PT Minutes: 18   Patient Effort good   Symptoms Noted During/After Treatment fatigue   General Information   Patient Profile Reviewed? yes   Pertinent History of Current Functional Problem 72 y.o., White female with history of pancreatic adenocarcinoma.  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  On 10/25/17 she underwent a robotic whipple procedure at [facility] with Dr. Cyndi Bender.  Pathologic stage was determined to be T3N0 with negative margins.  Post operative course was complicated by a chyle leak that was treated with bowel rest, TPN, and drainage.  Additionally, patient was readmitted on 11/22/17 for a LLQ surgical wound infection that was treated with Dakins wet to dry dressings and progression to wound vac treatment.  She has recently started chemotherapy again in [place], [place], but is unsure what chemotherapy she takes.  She does know that she is on Neulasta (pegfilgastim) to boost her WBC for chemotherapy.  Her last chemo treatment was on 03/26/18.     Medical Lines PIV Line;Telemetry   Respiratory Status room air   Existing Precautions/Restrictions full code;fall precautions   Mutuality/Individual Preferences   Individualized Care Needs OOB with FWW and assist   Pre Treatment Status   Pre Treatment Patient Status Patient supine in bed;Call light within reach;Telephone within reach;Sitter select activated;Nurse approved session   Support Present Pre Treatment  None   Communication Pre Treatment  Nurse   Cognitive Assessment/Interventions   Behavior/Mood Observations lethargic;cooperative   Orientation Status oriented to;person;place;situation   Attention WNL/WFL   Follows Commands follows one step commands;repetition of directions required   Pain Assessment   Pre/Post Treatment Pain Comment no c/o pain   Bed Mobility Assessment/Treatment   Bed Mobility, Assistive Device  bed rails   Supine-Sit [place] minimum assist (75% patient effort)   Safety Issues decreased use of arms for pushing/pulling;decreased use of legs for bridging/pushing   Impairments endurance;flexibility decreased;ROM decreased;strength decreased   Transfer Assessment/Treatment   Sit-Stand Independence contact guard assist   Stand-Sit Independence contact guard assist   Sit-Stand-Sit, Assist Device walker, front  wheeled   Toilet Transfer Independence contact guard assist   Toilet Transfer Assist Device walker, front wheeled   Transfer Safety Issues balance decreased during turns;step length decreased;weight-shifting ability decreased   Transfer Impairments balance impaired;flexibility decreased;strength decreased;endurance   Gait Assessment/Treatment   Independence  contact guard assist   Assistive Device  walker, front wheeled   Distance in Feet 15   Total Distance Ambulated 15   Gait Speed decreased   Deviations  cadence decreased;step length decreased;stride length decreased;weight-shifting ability decreased   Safety Issues  balance decreased during turns;weight-shifting ability decreased;step length decreased   Impairments  balance impaired;endurance;flexibility decreased;strength decreased   Balance Skill Training   Comment with FWW   Sitting Balance: Static good balance   Sitting, Dynamic (Balance) fair + balance   Sit-to-Stand Balance fair balance   Standing Balance: Static fair + balance   Standing Balance: Dynamic fair balance   Systems Impairment Contributing to Balance Disturbance musculoskeletal   Identified Impairments Contributing to Balance Disturbance decreased strength;decreased ROM   Therapeutic Exercise/Activity   Comment bed mobility, transfers, gait   Post Treatment Status   Post Treatment Patient Status Patient sitting in bedside chair or w/c;Call light within reach;Sitter select activated;Telephone within reach   Support Present Post Treatment  None   Communication Post Treatement Nurse   Plan of Care Review   Plan Of Care Reviewed With patient   Physical Therapy Clinical Impression   Assessment Pt tolerated treatment adequately. Pt required min A x 1 for bed mobility and CGA x 1 for sit<-->stand from bed and toilet and amb with FWW. Pt amb 15' in the room with FWW and CGA x 1. Pt demonstrates impairments in strength, balance, and endurance. PT recommends inpatient rehabilitation secondary to family  at home being out of town and unable to assist if pt were to be D/C at this time.   Anticipated Discharge Disposition inpatient rehabilitation facility       Therapist:   Matilde Sprang, PHYSICAL THERAPY STUDENT   Pager #: [phone]

## 2018-04-02 NOTE — Care Management Notes (Signed)
[facility]  Care Management Note    Patient Name: [name]  Date of Birth: 1946/05/21  Sex: female  Date/Time of Admission: 03/29/2018 12:44 AM  Room/Bed: 979/B  Payor: MEDICARE / Plan: MEDICARE PART A AND B / Product Type: Medicare /    LOS: 4 days   Primary Care Providers:  Ignacia Felling, MD, MD (General)    Admitting Diagnosis:  PE (pulmonary thromboembolism) (CMS [facility]) [I26.99]    Assessment:      04/02/18 1407   Assessment Details   Assessment Type Continued Assessment   Date of Care Management Update 04/02/18   Date of Next DCP Update 04/05/18   Care Management Plan   Discharge Planning Status plan in progress   Projected Discharge Date 04/03/18   CM will evaluate for rehabilitation potential yes   Patient choice offered to patient/family yes   Form for patient choice reviewed/signed and on chart no   Facility or [place], [place]   Patient aware of possible cost for ambulance transport?  Yes   Discharge Needs Assessment   Discharge Facility/Level of Care Needs Acute Rehab Placement/Return (not psych)(code 62)   Transportation Available ambulance       Discharge Plan:  Acute Rehab Placement/Return (not psych) (code 35)  Per touchbase rounds with service patient to continues Lovenox injections for PE.  Per patient she is unable to go home to her sisters house which is where she resided prior due to sister being on vacation and she would be alone.  Patient reports she contacted her son to stay with him.  CCC received telephone call from daughter in law, Kensy Blizard.  CCC gained verbal consent from patient to speak with [facility] regarding discharge plan and patient care.  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  Stanton Kidney states she has no issues assisting patient at discharge but there are 15 steps to enter their home and they do not even have a bed for patient to sleep in.  Stanton Kidney states she is unsure if they  could care for patient at home and have reservations her coming there.  Stanton Kidney states her and her husband have opposite work schedules but could not guarantee patient would have care 24hrs.  Patient sister and other sister [place] and patient cannot discharge home alone.  [place] spoke with PT/OT who states patient ambulated 26f with FWW and recommendation is acute rehab.  CCC met with patient at bedside obtained [facility] [place] [facility] [place]  Referral placed in AllScripts.  CCC to follow for discharge.     The patient will continue to be evaluated for developing discharge needs.     Case Manager: JFrederik Pear RN  Phone: [phone]

## 2018-04-03 ENCOUNTER — Encounter (FREE_STANDING_LABORATORY_FACILITY)
Admit: 2018-04-03 | Discharge: 2018-04-03 | Disposition: A | Discharge status: Home / Self Care | Payer: Self-pay | Attending: Physical Medicine & Rehabilitation | Admitting: Physical Medicine & Rehabilitation

## 2018-04-03 LAB — POC BLOOD GLUCOSE (RESULTS)
GLUCOSE, POC: 82 mg/dl (ref 70–105)
GLUCOSE, POC: 69 mg/dl — ABNORMAL LOW (ref 70–105)
GLUCOSE, POC: 64 mg/dl — ABNORMAL LOW (ref 70–105)
GLUCOSE, POC: 85 mg/dl (ref 70–105)
GLUCOSE, POC: 128 mg/dl — ABNORMAL HIGH (ref 70–105)

## 2018-04-03 MED ADMIN — nystatin 100,000 unit/gram topical powder: ORAL | @ 12:00:00 | NDC 00574200815

## 2018-04-03 NOTE — Care Management Notes (Addendum)
Care Coordinator/Social Work [place]  Patient Name: Production designer, theatre/television/film . Hue   MRN: [id number]   Acct Number: [id number]  DOB: [birth year] Age: 72  **Admission Information**  Patient Type: INPATIENT  Admit Date: 03/29/2018 Admit Time: 00:44  Admit Reason: Other pulmonary embolism without acute cor pulmonale  Admitting Phys: Nicola Police  Attending Phys: Nicola Police  Unit: [id number] Bed: 958-A  155. [employer] Form  Created by : Frederik Pear Date/Time 2018-04-03 10:46:29.000  [employer] Form  Has the patient received assistance in the last 12 months? No   Type of Assistance for this request Transportation   Reason for Assistance Lack of Family Support  Reason for Assistance Notes: Family on vacation for 7 days no  transportation available will pick up after rehab   Resource being utilized Resource being utilized Notes: [employer]  Number of members in your household 1   Do you have Medical insurance source? Yes- Medicare   Do you have Prescription insurance source? Yes A[employer]na Medicare  Have you applied for Medicaid? No   Have you applied for [facility]? No   Medicaid Verified No   Have Family or Friends been contacted for assistance? Yes All out of town at [employer] currently  Do you utilize support agencies for assistance (i.e. [employer], [facility])? No   Are you employeed? No   Do you have a checking account? Yes $501-$1000   Do you have a savings account? No   Gross Monthly Net Income (take home after deductions) $501-$1000   Gross Monthly Expenses $501-$1000   Patient eligible for assistance Yes Y.Ozella Rocks, MSW

## 2018-04-03 NOTE — Discharge Summary (Signed)
[facility]  DISCHARGE SUMMARY    PATIENT NAME:  [name], [name]  MRN:  [id number]  DOB:  Dec 18, 1945    ENCOUNTER DATE:  03/29/2018  INPATIENT ADMISSION DATE: 03/29/2018  DISCHARGE DATE:  04/03/2018    ATTENDING PHYSICIAN: [name]  SERVICE: SURG ONCOLOGY  PRIMARY CARE PHYSICIAN: [name]     LAY CAREGIVER:  ,  ,      PRIMARY DISCHARGE DIAGNOSIS:   Active Hospital Problems    Diagnosis Date Noted   . PE (pulmonary thromboembolism) (CMS HCC) 03/29/2018      Resolved Hospital Problems   No resolved problems to display.     Active Non-Hospital Problems    Diagnosis Date Noted   . Hypokalemia 11/23/2017   . Open abdominal wall wound 11/22/2017   . Pancreatic cancer (CMS [facility]) 10/25/2017   . Asthma 01/24/2017   . Type 2 diabetes mellitus (CMS [place]) 01/24/2017        DISCHARGE MEDICATIONS:     Current Discharge Medication List      START taking these medications.      Details   enoxaparin 100 mg/mL Syringe  Commonly known as:  LOVENOX   90 mg, Subcutaneous, EVERY 12 HOURS  Qty:  54 mL  Refills:  0        CONTINUE these medications - NO CHANGES were made during your visit.      Details   aspirin 81 mg Tablet, Delayed Release (E.C.)  Commonly known as:  ECOTRIN   81 mg, Oral  Refills:  0     atorvastatin 10 mg Tablet  Commonly known as:  LIPITOR   10 mg, Oral  Refills:  0     cetirizine 10 mg Tablet  Commonly known as:  ZYRTEC   10 mg, Oral  Refills:  0     citalopram 20 mg Tablet  Commonly known as:  CELEXA   20 mg, Oral  Refills:  0     furosemide 20 mg Tablet  Commonly known as:  LASIX   20 mg, DAILY  Refills:  0     gabapentin 300 mg Capsule  Commonly known as:  NEURONTIN   300 mg, DAILY  Refills:  0     glipiZIDE 10 mg Tablet Extended Rel 24 hr (2)  Commonly known as:  GLUCOTROL XL   10 mg, Oral  Refills:  0     hydroCHLOROthiazide 25 mg Tablet  Commonly known as:  HYDRODIURIL   25 mg, DAILY  Refills:  0     HYDROcodone-acetaminophen 5-325 mg Tablet  Commonly known as:  NORCO   1 Tab,  Oral, EVERY 4 HOURS PRN  Refills:  0     lidocaine-prilocaine 2.5-2.5 % Cream  Commonly known as:  EMLA   No dose, route, or frequency recorded.  Refills:  0     LORazepam 0.5 mg Tablet  Commonly known as:  ATIVAN   0.5 mg, DAILY  Refills:  0     losartan 100 mg Tablet  Commonly known as:  COZAAR   100 mg, Oral, DAILY  Refills:  0     MetFORMIN 1,000 mg Tablet  Commonly known as:  GLUCOPHAGE   1,000 mg, 2 TIMES DAILY WITH FOOD  Refills:  0     metoprolol succinate 50 mg Tablet Sustained Release 24 hr  Commonly known as:  TOPROL-XL   50 mg, DAILY  Refills:  0     montelukast 10 mg  Tablet  Commonly known as:  SINGULAIR   10 mg, Oral  Refills:  0     omeprazole 40 mg Capsule, Delayed Release(E.C.)  Commonly known as:  PRILOSEC   40 mg, Oral, DAILY  Qty:  90 Cap  Refills:  4     potassium chloride 20 mEq Tab Sust.Rel. Particle/Crystal  Commonly known as:  K-DUR   20 mEq, Oral  Refills:  0     prochlorperazine 10 mg Tablet  Commonly known as:  COMPAZINE   10 mg, Oral  Refills:  0     promethazine 25 mg Tablet  Commonly known as:  PHENERGAN   25 mg, Oral  Refills:  0     ZOFRAN 8 mg Tablet  Generic drug:  ondansetron   8 mg, Oral, EVERY 8 HOURS PRN  Refills:  0        STOP taking these medications.    oxyCODONE 5 mg Tablet  Commonly known as:  ROXICODONE          Discharge med list refreshed?  YES    ALLERGIES:  Allergies   Allergen Reactions   . Sulfa (Sulfonamides) Itching       HOSPITAL PROCEDURE(S):   Bedside Procedures:  No orders of the defined types were placed in this encounter.    Surgical     REASON FOR HOSPITALIZATION AND HOSPITAL COURSE     BRIEF HPI:  This is a 72 y.o., female admitted for symptomatic anemia s/p fall and pulmonary embolism     BRIEF HOSPITAL NARRATIVE: 72 yo female with history of pancreatic adenocarcinoma s/p neoadjuvant chemo and radiation and s/p robotic whipple on 10/25/17.  She is now on adjuvant chemotherapy and on neulasta, which she is not tolerating well.  She has severe headaches,  dizziness, and shortness of breath.  She fell and hit her head before admission.  Trauma work up was negative for acute injuries.  Patient was found to have a pulmonary embolism and symptomatic anemia with HGB below 7 requiring transfusion of 1u RBCs on admission.  Symptoms gradually improved, but headaches remained.  She was alert, tolerating po and pain was controlled before discharge to inpatient rehab facility. Patient is stable for discharge and all questions were answered prior to discharge and patient agrees to discharge at this time. Patient was instructed to follow up sooner for new or concerning symptoms.  She is being discharge on therapeutic lovenox for pulmonary embolism.      TRANSITION/POST DISCHARGE CARE/PENDING TESTS/REFERRALS: discharge to [facility] inpatient rehab  Follow up with medical oncology to discuss Neulasta treatment  Keep scheduled follow up with surg onc clinic and follow up prn    CONDITION ON DISCHARGE:  A. Ambulation: Up with assistance only  B. Self-care Ability: With partial assistance  C. Cognitive Status Alert and Oriented x 3  D. Code status at discharge:   Code Status Information     Code Status    Full Code             LINES/DRAINS/WOUNDS AT DISCHARGE:   Patient Lines/Drains/Airways Status    Active Line / Dialysis Catheter / Dialysis Graft / Drain / Airway / Wound     Name: Placement date: Placement time: Site: Days:    Implantable Port Right Chest  04/01/18   0530   2    Wound (Non-Surgical) Right Neck  10/30/17   0800   155    Wound (Non-Surgical) Anterior;Left Abdomen  11/10/17   1900  [facility]      DISCHARGE INSTRUCTIONS:    No discharge procedures on file.       [name]      Copies sent to Care Team       Relationship Specialty Notifications Start End    [name] PCP - General EXTERNAL  03/30/18     Phone: [phone] Fax: [phone]         [place] [address]          Referring providers can utilize [email or web address] to access their referred [place] patient's information.

## 2018-04-03 NOTE — Discharge Instructions (Signed)
Nursing please call report to [facility] [phone]

## 2018-04-03 NOTE — Care Plan (Signed)
Patient is pleasant and cooperative. Fall and Skin precautions were maintained with safety precautions reinforced and sitter select being used. Patient slept fair. She ambulates to the bathroom, using a walker and stand by assist. He appetite is fair and glucose was normal. She is receiving Heparin SC injections. Port is accessed and patent. Daily rounding reviews results and discusses the plan of care with the patient updated and support given. Plan is for discharge today to home, to receive Lovenox. Abdomen is full and legs remain edematous.

## 2018-04-03 NOTE — Care Plan (Signed)
[place]  Physical Therapy Progress Note      Patient Name: [name]  Date of Birth: 1946-03-21  Height:  154.9 cm (5\' 1" )  Weight:  94.1 kg (207 lb 7.3 oz)  Room/Bed: 958/A  Payor: MEDICARE / Plan: MEDICARE PART A AND B / Product Type: Medicare /     Assessment:     Pt tolerated treatment well. Pt required min A x 2 for bed mobility to assist LE into bed and reposition the pt. Pt required standby A x 1 with FWW for sit<-->stand. Pt amb 300' with FWW and CGA x 1, pt c/o dizziness attributed to medication. Pt demonstrates impairments in strength, balance, and endurance. PT recommends inpatient rehabilitation once pt is medically stable for D/C until family is home to assist.    Discharge Needs:   Equipment Recommendation: none anticipated        Discharge Disposition: inpatient rehabilitation facility    JUSTIFICATION OF DISCHARGE RECOMMENDATION   Based on current diagnosis, functional performance prior to admission, and current functional performance, this patient requires continued PT services in inpatient rehabilitation facility in order to achieve significant functional improvements in these deficit areas: aerobic capacity/endurance, gait, locomotion, and balance.      Plan:   Continue to follow patient according to established plan of care.  The risks/benefits of therapy have been discussed with the patient/caregiver and he/she is in agreement with the established plan of care.     Subjective & Objective:        04/03/18 1318   Therapist Pager   PT Assigned/ Pager # Jess [phone]/Rachel Pound   Rehab Session   Document Type therapy progress note (daily note)   Total PT Minutes: 11   Patient Effort good   Symptoms Noted During/After Treatment dizziness   General Information   Patient Profile Reviewed? yes   Medical Lines PIV Line;Telemetry   Respiratory Status room air   Existing Precautions/Restrictions full code;fall precautions   Mutuality/Individual Preferences      Individualized Care Needs OOB FWW A x 1   Pre Treatment Status   Pre Treatment Patient Status Patient in restroom;Nurse approved session   Support Present Pre Treatment  None   Communication Pre Treatment  Nurse   Cognitive Assessment/Interventions   Behavior/Mood Observations alert;cooperative   Orientation Status oriented x 3   Attention WNL/WFL   Follows Commands follows one step commands   Pain Assessment   Pre/Post Treatment Pain Comment no c/o pain   Bed Mobility Assessment/Treatment   Bed Mobility, Assistive Device draw sheet   Sit to Supine, [place] minimum assist (75% patient effort);2 person assist required   Safety Issues decreased use of arms for pushing/pulling;decreased use of legs for bridging/pushing   Impairments strength decreased;flexibility decreased;endurance   Comment   (assisted legs into bed, used draw sheet to reposition)   Transfer Assessment/Treatment   Sit-Stand [place] stand-by assistance   Stand-Sit [place] stand-by assistance   Sit-Stand-Sit, Assist Device walker, front wheeled   Toilet Transfer [place] stand-by assistance   Toilet Transfer Assist Device walker, front wheeled  (grab bar)   Transfer Impairments balance impaired;flexibility decreased;strength decreased;endurance   Gait Assessment/Treatment   Independence  contact guard assist   Assistive Device  walker, front wheeled   Distance in Feet 300   Total Distance Ambulated 300   Gait Speed decreased   Deviations  cadence decreased;step length decreased;stride length decreased;weight-shifting ability decreased   Safety Issues  balance decreased during turns;step  length decreased;weight-shifting ability decreased   Impairments  balance impaired;endurance;strength decreased;flexibility decreased   Balance Skill Training   Comment with FWW   Sitting Balance: Static good balance   Sitting, Dynamic (Balance) fair + balance   Sit-to-Stand Balance fair balance   Standing Balance: Static fair + balance   Standing  Balance: Dynamic fair balance   Systems Impairment Contributing to Balance Disturbance musculoskeletal   Identified Impairments Contributing to Balance Disturbance decreased strength;decreased ROM   Therapeutic Exercise/Activity   Comment bed mobility, transfers, gait   Post Treatment Status   Post Treatment Patient Status Patient supine in bed;Call light within reach;Telephone within reach;Sitter select activated   Support Present Post Treatment  None   Communication Post Treatement Nurse   Plan of Care Review   Plan Of Care Reviewed With patient   Physical Therapy Clinical Impression   Assessment Pt tolerated treatment well. Pt required min A x 2 for bed mobility to assist LE into bed and reposition the pt. Pt required standby A x 1 with FWW for sit<-->stand. Pt amb 300' with FWW and CGA x 1, pt c/o dizziness attributed to medication. Pt demonstrates impairments in strength, balance, and endurance. PT recommends inpatient rehabilitation once pt is medically stable for D/C until family is home to assist.       Therapist:   Matilde Sprang, [place]   Pager #: [phone]

## 2018-04-03 NOTE — Nurses Notes (Addendum)
Discharge instructions reviewed with a verbal understanding noted by patient. Port will be de-accessed at 1630 when transportation arrives. Appointment dates, medications, wound care, and activity reviewed with patient. No further questions at this time. Patient has scripts being filled in our pharmacy. Patient waiting on transportation through the hospital to take her at 1630 to [facility] [place]. RN called report to Estill Bamberg at [facility] who had no further questions.    1620 - port de-accessed and patient put in teletrak to go to lobby via wheel chair.

## 2018-04-03 NOTE — Progress Notes (Signed)
[facility] NOTE    [name], [name], 72 y.o. female  Date of Admission:  03/29/2018  Date of Birth:  [birth year]  Date of Service:  04/03/2018    Subjective: NAE overnight.  Patient has no new complaints this morning.     Patient evaluated by PT yesterday.  Acceptable candidate for Rehab placement upon discharge.      Vital Signs:  Temp (24hrs) Max:36.9 C (61.6 F)      Systolic (07PXT), GGY:694 , Min:112 , WNI:627     Diastolic (03JKK), XFG:18, Min:52, Max:74    Temp  Avg: 36.6 C (97.8 F)  Min: 36.2 C (97.2 F)  Max: 36.9 C (98.4 F)  MAP (Non-Invasive)  Avg: 81.4 mmHG  Min: 72 mmHG  Max: 90 mmHG  Pulse  Avg: 67.7  Min: 50  Max: 75  Resp  Avg: 17.5  Min: 17  Max: 18  SpO2  Avg: 97 %  Min: 94 %  Max: 99 %  Pain Score (Numeric, Faces): 0    Current Medications:    Current Facility-Administered Medications:  acetaminophen (TYLENOL) tablet 650 mg Oral Q4H PRN   calcium carbonate (TUMS) chewable tablet 1,000 mg Oral 3x/day-Meals   cetirizine (ZYRTEC) tablet 10 mg Oral Daily   citalopram (CELEXA) tablet 20 mg Oral Daily   enoxaparin PF (LOVENOX) 100 mg/mL SubQ injection 1 mg/kg Subcutaneous Q12H   furosemide (LASIX) tablet 40 mg Oral 2x/day   gabapentin (NEURONTIN) capsule 300 mg Oral Daily   glipiZIDE (GLUCOTROL) tablet 5 mg Oral 2x/day AC   HYDROcodone-acetaminophen (NORCO) 5-325 mg per tablet 1 Tab Oral Q4H PRN   magnesium oxide (MAG-OX) tablet 400 mg Oral 2x/day   melatonin tablet 3 mg Oral QPM   metFORMIN (GLUCOPHAGE) tablet 1,000 mg Oral 2x/day-Food   metoprolol succinate (TOPROL-XL) 24 hr extended release tablet 50 mg Oral Daily   montelukast (SINGULAIR) 10 mg tablet 10 mg Oral QPM   NS flush syringe 2 mL Intracatheter Q8HRS   And      NS flush syringe 2-6 mL Intracatheter Q1 MIN PRN   nystatin (MYCOSTATIN) 100,000 units/g topical cream  Apply Topically 2x/day   ondansetron (ZOFRAN) tablet 8 mg Oral Q8H PRN   pantoprazole (PROTONIX)  delayed release tablet 20 mg Oral Daily   SSIP insulin lispro (HUMALOG) 100 units/mL injection 3-9 Units Subcutaneous 4x/day PRN       Today's Physical Exam:  Constitutional:  mildly obese and no distress  Respiratory:  non labored respriations  Cardiovascular:  regular rate and rhythm  Gastrointestinal:  Soft, non-tender, non-distended  Musculoskeletal:  bilateral lower extremity edema present   Integumentary:  Skin warm and dry  Neurologic:  Grossly normal  Psychiatric:  Affect Normal    I/O:  I/O last 24 hours:      Intake/Output Summary (Last 24 hours) at 04/03/2018 0713  Last data filed at 04/03/2018 0310  Gross per 24 hour   Intake 1277 ml   Output --   Net 1277 ml     I/O current shift:  No intake/output data recorded.    Nutrition/Residuals:  DIET DIABETIC Calorie amount: CC 1800    Labs  (Please indicate ordered or reviewed  AM labs ordered, but have not  been drawn yet.  Contacted nurse to request labs to be drawn      Assessment/ Plan:   Active Hospital Problems   (*Primary Problem)    Diagnosis   . PE (pulmonary thromboembolism) (CMS HCC)   -Patient wants to try out lovenox shots instead of warfarin pills at this time for anticoagulation.  Heparin drip stopped.  Therapeutic lovenox started.    - Appreciate [name] consult  - will transfuse 1 unit PRBCs if Hbg drops below 8. Patient should f/u with her primary oncologist, [name] when discharged  Continue diabetic diet today.  OOB as tolerated.    Restarted home lasix 40mg  PO bid  CM consult to work on placement at [facility] facility today.      [name] Solomons Nock, PA-C  04/03/2018, 07:15

## 2018-04-03 NOTE — Care Management Notes (Signed)
[facility] Management Note    Patient Name: [name]  Date of Birth: 1946/04/18  Sex: female  Date/Time of Admission: 03/29/2018 12:44 AM  Room/Bed: 958/A  Payor: MEDICARE / Plan: MEDICARE PART A AND B / Product Type: Medicare /    LOS: 5 days   Primary Care Providers:  Ignacia Felling, MD, MD (General)    Admitting Diagnosis:  PE (pulmonary thromboembolism) (CMS [facility]) [I26.99]    Assessment:      04/03/18 1049   Discharge Information   Discharge Disposition acute rehab   Acute [place]   Discharge Date 04/03/18   Transport Type   Transport Mode Public Transportation       Discharge Plan:  Acute Rehab Placement/Return (not psych) (code 52)  Per TBR with service patient is medically stable for discharge to [facility] today if bed available.  [place] spoke with Threasa Beards [facility] liason who states patient accepted at 4:30pm.  Patient does not meet medical necessity for ambulance.  Patient authorized for [facility] at 4:30pm per Y.Matheson, MSW.  CCC met with patient at bedside, Freedom of Choice signed, copy placed on chart.  Comfort form completed and submitted.  Number placed in AVS for nursing to call report.  Bedside nurse, charge, patient, service updated.  CCC to follow.     The patient will continue to be evaluated for developing discharge needs.     Case Manager: Frederik Pear, RN  Phone: [phone]

## 2018-04-04 ENCOUNTER — Other Ambulatory Visit (FREE_STANDING_LABORATORY_FACILITY): Payer: Self-pay | Admitting: PHYSICIAN ASSISTANT

## 2018-04-04 ENCOUNTER — Other Ambulatory Visit (FREE_STANDING_LABORATORY_FACILITY): Payer: Self-pay | Admitting: Physical Medicine & Rehabilitation

## 2018-04-04 LAB — MANUAL DIFF AND MORPHOLOGY-SYSMEX
BASOPHIL #: <0.1 x10ˆ3/uL (ref <=0.20)
BASOPHIL %: 0 %
EOSINOPHIL #: 1.42 x10ˆ3/uL — ABNORMAL HIGH (ref <=0.50)
EOSINOPHIL %: 5 %
LYMPHOCYTE #: <0.1 x10ˆ3/uL — ABNORMAL LOW (ref 1.00–4.80)
LYMPHOCYTE %: 0 %
MONOCYTE #: 0.85 10*3/uL (ref 0.20–1.10)
MONOCYTE %: 3 %
NEUTROPHIL #: 26.13 x10ˆ3/uL — ABNORMAL HIGH (ref 1.50–7.70)
NEUTROPHIL %: 92 %
NRBC FROM MANUAL DIFF: 1 /100{WBCs}

## 2018-04-04 LAB — CBC WITH DIFF
HCT: 29.9 % — ABNORMAL LOW (ref 34.8–46.0)
HGB: 9.7 g/dL — ABNORMAL LOW (ref 11.5–16.0)
MCH: 30.7 pg (ref 26.0–32.0)
MCHC: 32.4 g/dL (ref 31.0–35.5)
MCV: 94.6 fL (ref 78.0–100.0)
MPV: 12.5 fL (ref 8.7–12.5)
PLATELETS: 93 x10ˆ3/uL — ABNORMAL LOW (ref 150–400)
RBC: 3.16 x10ˆ6/uL — ABNORMAL LOW (ref 3.85–5.22)
RDW-CV: 18.1 % — ABNORMAL HIGH (ref 11.5–15.5)
WBC: 28.4 x10ˆ3/uL — ABNORMAL HIGH (ref 3.7–11.0)

## 2018-04-04 LAB — COMPREHENSIVE METABOLIC PANEL, NON-FASTING
ALBUMIN: 1.9 g/dL — ABNORMAL LOW (ref 3.4–4.8)
ALKALINE PHOSPHATASE: 315 U/L — ABNORMAL HIGH (ref 55–145)
ALT (SGPT): 15 U/L (ref <55)
ANION GAP: 7 mmol/L (ref 4–13)
AST (SGOT): 25 U/L (ref 8–41)
BILIRUBIN TOTAL: 0.3 mg/dL (ref 0.3–1.3)
BUN/CREA RATIO: 9 (ref 6–22)
BUN: 7 mg/dL — ABNORMAL LOW (ref 8–25)
CALCIUM: 7.9 mg/dL — ABNORMAL LOW (ref 8.5–10.2)
CHLORIDE: 98 mmol/L (ref 96–111)
CO2 TOTAL: 30 mmol/L (ref 22–32)
CREATININE: 0.74 mg/dL (ref 0.49–1.10)
ESTIMATED GFR: >59 mL/min/{1.73_m2} (ref >59)
GLUCOSE: 95 mg/dL (ref 65–139)
POTASSIUM: 4.5 mmol/L (ref 3.5–5.1)
PROTEIN TOTAL: 3.7 g/dL — ABNORMAL LOW (ref 6.0–8.0)
SODIUM: 135 mmol/L — ABNORMAL LOW (ref 136–145)

## 2018-04-04 LAB — MAGNESIUM
MAGNESIUM: 0.9 mg/dL — CL (ref 1.6–2.5)
MAGNESIUM: 0.9 mg/dL — CL (ref 1.6–2.5)

## 2018-04-04 LAB — CBC/DIFF - CLIENT CONSOLIDATED
BASOPHIL #: <0.1 10*3/uL (ref <=0.20)
BASOPHIL %: 0 %
EOSINOPHIL #: 1.42 10*3/uL — ABNORMAL HIGH (ref <=0.50)
EOSINOPHIL %: 5 %
EOSINOPHIL %: 5 %
HCT: 29.9 % — ABNORMAL LOW (ref 34.8–46.0)
HGB: 9.7 g/dL — ABNORMAL LOW (ref 11.5–16.0)
HGB: 9.7 g/dL — ABNORMAL LOW (ref 11.5–16.0)
LYMPHOCYTE #: <0.1 10*3/uL — ABNORMAL LOW (ref 1.00–4.80)
LYMPHOCYTE %: 0 %
MCH: 30.7 pg (ref 26.0–32.0)
MCHC: 32.4 g/dL (ref 31.0–35.5)
MCV: 94.6 fL (ref 78.0–100.0)
MCV: 94.6 fL (ref 78.0–100.0)
MONOCYTE #: 0.85 10*3/uL (ref 0.20–1.10)
MONOCYTE %: 3 %
MPV: 12.5 fL (ref 8.7–12.5)
NEUTROPHIL #: 26.13 10*3/uL — ABNORMAL HIGH (ref 1.50–7.70)
NEUTROPHIL %: 92 %
NRBC FROM MANUAL DIFF: 1 per 100 WBC
PLATELETS: 93 10*3/uL — ABNORMAL LOW (ref 150–400)
RBC: 3.16 10*6/uL — ABNORMAL LOW (ref 3.85–5.22)
RDW-CV: 18.1 % — ABNORMAL HIGH (ref 11.5–15.5)
WBC: 28.4 10*3/uL — ABNORMAL HIGH (ref 3.7–11.0)

## 2018-04-04 NOTE — Care Management Notes (Signed)
Referral Information  ++++++ Placed Provider #1 ++++++  Case Manager: Jonathan Hoyle  Provider Type: Acute -Rehab  Provider Name: [facility]  Address:  [address]  [place], [place] [address]  Contact: Christy Johnson    Phone: [phone] x  Fax:   Fax: [phone]

## 2018-04-05 ENCOUNTER — Other Ambulatory Visit (FREE_STANDING_LABORATORY_FACILITY): Payer: Self-pay | Admitting: PHYSICIAN ASSISTANT

## 2018-04-05 ENCOUNTER — Other Ambulatory Visit (HOSPITAL_COMMUNITY): Payer: Self-pay | Admitting: Physical Medicine & Rehabilitation

## 2018-04-05 ENCOUNTER — Ambulatory Visit
Admission: RE | Admit: 2018-04-05 | Discharge: 2018-04-05 | Disposition: A | Discharge status: Home / Self Care | Payer: Medicare Other | Source: Ambulatory Visit | Attending: Physical Medicine & Rehabilitation | Admitting: Physical Medicine & Rehabilitation

## 2018-04-05 DIAGNOSIS — I82442 Acute embolism and thrombosis of left tibial vein: Secondary | ICD-10-CM

## 2018-04-05 DIAGNOSIS — R609 Edema, unspecified: Secondary | ICD-10-CM

## 2018-04-05 DIAGNOSIS — I82402 Acute embolism and thrombosis of unspecified deep veins of left lower extremity: Secondary | ICD-10-CM | POA: Insufficient documentation

## 2018-04-05 LAB — MAGNESIUM: MAGNESIUM: 1.2 mg/dL — ABNORMAL LOW (ref 1.6–2.5)

## 2018-04-06 ENCOUNTER — Other Ambulatory Visit (FREE_STANDING_LABORATORY_FACILITY): Payer: Self-pay | Admitting: PHYSICIAN ASSISTANT

## 2018-04-06 LAB — CBC
HCT: 27.2 % — ABNORMAL LOW (ref 34.8–46.0)
HGB: 8.9 g/dL — ABNORMAL LOW (ref 11.5–16.0)
MCH: 30.8 pg (ref 26.0–32.0)
MCHC: 32.7 g/dL (ref 31.0–35.5)
MCV: 94.1 fL (ref 78.0–100.0)
MPV: 12.3 fL (ref 8.7–12.5)
PLATELETS: 116 10*3/uL — ABNORMAL LOW (ref 150–400)
RBC: 2.89 10*6/uL — ABNORMAL LOW (ref 3.85–5.22)
RDW-CV: 18.1 % — ABNORMAL HIGH (ref 11.5–15.5)
WBC: 17.6 10*3/uL — ABNORMAL HIGH (ref 3.7–11.0)

## 2018-04-06 LAB — MAGNESIUM: MAGNESIUM: 1.6 mg/dL (ref 1.6–2.5)

## 2018-04-06 LAB — PREALBUMIN: PREALBUMIN: 7.1 mg/dL — ABNORMAL LOW (ref 18.0–45.0)

## 2018-04-07 LAB — OCCULT BLOOD, STOOL: OCCULT BLOOD: NEGATIVE

## 2018-04-09 ENCOUNTER — Other Ambulatory Visit (FREE_STANDING_LABORATORY_FACILITY): Payer: Self-pay | Admitting: Physical Medicine & Rehabilitation

## 2018-04-09 ENCOUNTER — Other Ambulatory Visit (FREE_STANDING_LABORATORY_FACILITY): Payer: Self-pay | Admitting: PHYSICIAN ASSISTANT

## 2018-04-09 LAB — CBC
HCT: 31.9 % — ABNORMAL LOW (ref 34.8–46.0)
HGB: 10.4 g/dL — ABNORMAL LOW (ref 11.5–16.0)
MCH: 31.4 pg (ref 26.0–32.0)
MCHC: 32.6 g/dL (ref 31.0–35.5)
MCV: 96.4 fL (ref 78.0–100.0)
MPV: 12.9 fL — ABNORMAL HIGH (ref 8.7–12.5)
PLATELETS: 205 10*3/uL (ref 150–400)
RBC: 3.31 10*6/uL — ABNORMAL LOW (ref 3.85–5.22)
RDW-CV: 19.7 % — ABNORMAL HIGH (ref 11.5–15.5)
WBC: 11 10*3/uL (ref 3.7–11.0)

## 2018-04-09 LAB — VITAMIN B12
VITAMIN B 12: >2000 pg/mL — ABNORMAL HIGH (ref 200–1000)
VITAMIN B 12: >2000 pg/mL — ABNORMAL HIGH (ref 200–1000)

## 2018-04-09 LAB — BASIC METABOLIC PANEL
ANION GAP: 5 mmol/L (ref 4–13)
BUN/CREA RATIO: 13 (ref 6–22)
BUN: 9 mg/dL (ref 8–25)
CALCIUM: 8.1 mg/dL — ABNORMAL LOW (ref 8.5–10.2)
CHLORIDE: 98 mmol/L (ref 96–111)
CO2 TOTAL: 32 mmol/L (ref 22–32)
CREATININE: 0.71 mg/dL (ref 0.49–1.10)
ESTIMATED GFR: >59 mL/min/{1.73_m2} (ref >59)
GLUCOSE: 61 mg/dL — ABNORMAL LOW (ref 65–139)
POTASSIUM: 4.3 mmol/L (ref 3.5–5.1)
SODIUM: 135 mmol/L — ABNORMAL LOW (ref 136–145)

## 2018-04-09 LAB — FOLATE: FOLATE: 13.5 ng/mL (ref >=7.0)

## 2018-04-09 LAB — IRON: IRON: 100 ug/dL (ref 45–170)

## 2018-04-09 LAB — MAGNESIUM: MAGNESIUM: 1.2 mg/dL — ABNORMAL LOW (ref 1.6–2.5)

## 2018-04-10 ENCOUNTER — Other Ambulatory Visit (FREE_STANDING_LABORATORY_FACILITY): Payer: Self-pay | Admitting: PHYSICIAN ASSISTANT

## 2018-04-10 LAB — OCCULT BLOOD, STOOL: OCCULT BLOOD: NEGATIVE

## 2018-04-10 LAB — MAGNESIUM: MAGNESIUM: 1.6 mg/dL (ref 1.6–2.5)

## 2018-04-12 ENCOUNTER — Other Ambulatory Visit (FREE_STANDING_LABORATORY_FACILITY): Payer: Self-pay | Admitting: PHYSICIAN ASSISTANT

## 2018-04-12 LAB — MAGNESIUM
MAGNESIUM: 1.3 mg/dL (ref 1.6–2.5)
MAGNESIUM: 1.3 mg/dL — ABNORMAL LOW (ref 1.6–2.5)

## 2018-04-16 ENCOUNTER — Other Ambulatory Visit (FREE_STANDING_LABORATORY_FACILITY): Payer: Self-pay | Admitting: PHYSICIAN ASSISTANT

## 2018-04-16 LAB — CBC
HCT: 24.5 % — ABNORMAL LOW (ref 34.8–46.0)
HGB: 7.9 g/dL — ABNORMAL LOW (ref 11.5–16.0)
MCH: 31.5 pg (ref 26.0–32.0)
MCHC: 32.2 g/dL (ref 31.0–35.5)
MCV: 97.6 fL (ref 78.0–100.0)
PLATELETS: 112 10*3/uL — ABNORMAL LOW (ref 150–400)
RBC: 2.51 10*6/uL — ABNORMAL LOW (ref 3.85–5.22)
RDW-CV: 19.8 % — ABNORMAL HIGH (ref 11.5–15.5)
WBC: 4.7 10*3/uL (ref 3.7–11.0)

## 2018-04-16 LAB — BASIC METABOLIC PANEL
ANION GAP: 4 mmol/L (ref 4–13)
BUN/CREA RATIO: 15 (ref 6–22)
BUN: 10 mg/dL (ref 8–25)
CALCIUM: 7.6 mg/dL — ABNORMAL LOW (ref 8.5–10.2)
CHLORIDE: 98 mmol/L (ref 96–111)
CO2 TOTAL: 28 mmol/L (ref 22–32)
CREATININE: 0.68 mg/dL (ref 0.49–1.10)
ESTIMATED GFR: >59 mL/min/1.73mˆ2 (ref >59)
GLUCOSE: 125 mg/dL (ref 65–139)
POTASSIUM: 4.3 mmol/L (ref 3.5–5.1)
SODIUM: 130 mmol/L — ABNORMAL LOW (ref 136–145)

## 2018-04-16 LAB — MAGNESIUM: MAGNESIUM: 1.2 mg/dL — ABNORMAL LOW (ref 1.6–2.5)

## 2018-04-17 ENCOUNTER — Other Ambulatory Visit (FREE_STANDING_LABORATORY_FACILITY): Payer: Self-pay | Admitting: PHYSICIAN ASSISTANT

## 2018-04-17 LAB — BPAM PACKED CELL ORDER
UNIT DIVISION: 0
UNIT DIVISION: 0

## 2018-04-17 LAB — TYPE AND CROSS RED CELLS - UNITS
ABO/RH(D): A POS
ANTIBODY SCREEN: NEGATIVE
UNITS ORDERED: 2

## 2018-04-17 LAB — H & H
HCT: 29.3 % — ABNORMAL LOW (ref 34.8–46.0)
HGB: 9.9 g/dL — ABNORMAL LOW (ref 11.5–16.0)

## 2018-04-17 LAB — MAGNESIUM: MAGNESIUM: 2 mg/dL (ref 1.6–2.5)

## 2018-08-08 ENCOUNTER — Encounter (INDEPENDENT_AMBULATORY_CARE_PROVIDER_SITE_OTHER): Payer: Self-pay | Admitting: SURGICAL ONCOLOGY

## 2018-08-25 ENCOUNTER — Other Ambulatory Visit: Payer: Self-pay

## 2018-08-25 ENCOUNTER — Inpatient Hospital Stay
Admission: AD | Admit: 2018-08-25 | Discharge: 2018-09-04 | DRG: 432 | Disposition: A | Discharge status: Skilled Nursing Facility | Payer: Medicare Other | Source: Other Acute Inpatient Hospital | Attending: Hospitalist | Admitting: Hospitalist

## 2018-08-25 ENCOUNTER — Inpatient Hospital Stay (HOSPITAL_COMMUNITY): Payer: Medicare Other | Admitting: Hospitalist

## 2018-08-25 ENCOUNTER — Encounter (HOSPITAL_COMMUNITY): Payer: Self-pay

## 2018-08-25 ENCOUNTER — Inpatient Hospital Stay (HOSPITAL_COMMUNITY): Payer: Medicare Other

## 2018-08-25 DIAGNOSIS — I2699 Other pulmonary embolism without acute cor pulmonale: Secondary | ICD-10-CM

## 2018-08-25 DIAGNOSIS — C259 Malignant neoplasm of pancreas, unspecified: Secondary | ICD-10-CM

## 2018-08-25 DIAGNOSIS — E871 Hypo-osmolality and hyponatremia: Secondary | ICD-10-CM | POA: Diagnosis present

## 2018-08-25 DIAGNOSIS — E876 Hypokalemia: Secondary | ICD-10-CM | POA: Diagnosis present

## 2018-08-25 DIAGNOSIS — I509 Heart failure, unspecified: Secondary | ICD-10-CM

## 2018-08-25 DIAGNOSIS — Z86711 Personal history of pulmonary embolism: Secondary | ICD-10-CM

## 2018-08-25 DIAGNOSIS — M1990 Unspecified osteoarthritis, unspecified site: Secondary | ICD-10-CM | POA: Diagnosis present

## 2018-08-25 DIAGNOSIS — Z7901 Long term (current) use of anticoagulants: Secondary | ICD-10-CM

## 2018-08-25 DIAGNOSIS — I110 Hypertensive heart disease with heart failure: Secondary | ICD-10-CM

## 2018-08-25 DIAGNOSIS — E1142 Type 2 diabetes mellitus with diabetic polyneuropathy: Secondary | ICD-10-CM | POA: Diagnosis present

## 2018-08-25 DIAGNOSIS — Z66 Do not resuscitate: Secondary | ICD-10-CM | POA: Diagnosis present

## 2018-08-25 DIAGNOSIS — J45909 Unspecified asthma, uncomplicated: Secondary | ICD-10-CM | POA: Diagnosis present

## 2018-08-25 DIAGNOSIS — G9341 Metabolic encephalopathy: Secondary | ICD-10-CM

## 2018-08-25 DIAGNOSIS — Z7984 Long term (current) use of oral hypoglycemic drugs: Secondary | ICD-10-CM

## 2018-08-25 DIAGNOSIS — F419 Anxiety disorder, unspecified: Secondary | ICD-10-CM | POA: Diagnosis present

## 2018-08-25 DIAGNOSIS — K7460 Unspecified cirrhosis of liver: Secondary | ICD-10-CM

## 2018-08-25 DIAGNOSIS — Z923 Personal history of irradiation: Secondary | ICD-10-CM

## 2018-08-25 DIAGNOSIS — I21A1 Myocardial infarction type 2: Secondary | ICD-10-CM | POA: Diagnosis present

## 2018-08-25 DIAGNOSIS — N390 Urinary tract infection, site not specified: Secondary | ICD-10-CM | POA: Diagnosis present

## 2018-08-25 DIAGNOSIS — I5032 Chronic diastolic (congestive) heart failure: Secondary | ICD-10-CM

## 2018-08-25 DIAGNOSIS — R601 Generalized edema: Secondary | ICD-10-CM

## 2018-08-25 DIAGNOSIS — Z794 Long term (current) use of insulin: Secondary | ICD-10-CM | POA: Diagnosis present

## 2018-08-25 DIAGNOSIS — F329 Major depressive disorder, single episode, unspecified: Secondary | ICD-10-CM | POA: Diagnosis present

## 2018-08-25 DIAGNOSIS — G4730 Sleep apnea, unspecified: Secondary | ICD-10-CM | POA: Diagnosis present

## 2018-08-25 DIAGNOSIS — I4891 Unspecified atrial fibrillation: Secondary | ICD-10-CM | POA: Diagnosis present

## 2018-08-25 DIAGNOSIS — Z9221 Personal history of antineoplastic chemotherapy: Secondary | ICD-10-CM

## 2018-08-25 DIAGNOSIS — Z87891 Personal history of nicotine dependence: Secondary | ICD-10-CM

## 2018-08-25 DIAGNOSIS — D638 Anemia in other chronic diseases classified elsewhere: Secondary | ICD-10-CM | POA: Diagnosis present

## 2018-08-25 DIAGNOSIS — S31109A Unspecified open wound of abdominal wall, unspecified quadrant without penetration into peritoneal cavity, initial encounter: Secondary | ICD-10-CM | POA: Diagnosis present

## 2018-08-25 DIAGNOSIS — K219 Gastro-esophageal reflux disease without esophagitis: Secondary | ICD-10-CM | POA: Diagnosis present

## 2018-08-25 DIAGNOSIS — R0600 Dyspnea, unspecified: Secondary | ICD-10-CM

## 2018-08-25 DIAGNOSIS — R188 Other ascites: Secondary | ICD-10-CM | POA: Diagnosis present

## 2018-08-25 DIAGNOSIS — J449 Chronic obstructive pulmonary disease, unspecified: Secondary | ICD-10-CM

## 2018-08-25 DIAGNOSIS — E119 Type 2 diabetes mellitus without complications: Secondary | ICD-10-CM

## 2018-08-25 DIAGNOSIS — J9690 Respiratory failure, unspecified, unspecified whether with hypoxia or hypercapnia: Secondary | ICD-10-CM

## 2018-08-25 DIAGNOSIS — Z7982 Long term (current) use of aspirin: Secondary | ICD-10-CM

## 2018-08-25 DIAGNOSIS — E669 Obesity, unspecified: Secondary | ICD-10-CM | POA: Diagnosis present

## 2018-08-25 DIAGNOSIS — E7800 Pure hypercholesterolemia, unspecified: Secondary | ICD-10-CM | POA: Diagnosis present

## 2018-08-25 HISTORY — DX: Generalized edema: R60.1

## 2018-08-25 LAB — ECG 12 LEAD - ADULT
Calculated P Axis: 45 deg
Calculated P Axis: 45 deg
Calculated T Axis: 22 deg
EKG Severity: ABNORMAL
Heart Rate: 59 {beats}/min
I 40 Axis: 33 deg
PR Interval: 173 ms
QRS Axis: -19 deg
QRS Duration: 104 ms
QT Interval: 499 ms
QTC Calculation: 495 ms
QTC Calculation: 495 ms
ST Axis: 58 deg
T 40 Axis: -39 deg

## 2018-08-25 LAB — URINALYSIS, MACRO/MICRO
BLOOD: NEGATIVE mg/dL
GLUCOSE: NEGATIVE mg/dL
NITRITE: POSITIVE — AB
PH: 6 (ref 5.0–7.0)
PROTEIN: NEGATIVE mg/dL
SPECIFIC GRAVITY: 1.013 (ref 1.010–1.025)
UROBILINOGEN: 1 mg/dL

## 2018-08-25 LAB — POC BLOOD GLUCOSE (RESULTS): GLUCOSE, POC: 120 mg/dL — ABNORMAL HIGH (ref 70–110)

## 2018-08-25 LAB — SODIUM, RANDOM URINE: SODIUM RANDOM URINE: <10 mmol/L

## 2018-08-25 LAB — TROPONIN-I: TROPONIN I: 0.22 ng/mL (ref <=0.04)

## 2018-08-25 LAB — OSMOLALITY, RANDOM URINE: OSMOLALITY URINE: 440 mosm/kg (ref 250–900)

## 2018-08-25 MED ORDER — ACETAMINOPHEN 325 MG TABLET
650.0000 mg | ORAL_TABLET | Freq: Four times a day (QID) | ORAL | Status: DC | PRN
Start: 2018-08-25 — End: 2018-09-04
  Administered 2018-08-26 – 2018-09-02 (×3): 650 mg via ORAL
  Filled 2018-08-25 (×3): qty 2

## 2018-08-25 MED ORDER — ALBUTEROL SULFATE 2.5 MG/3 ML (0.083 %) SOLUTION FOR NEBULIZATION
2.5000 mg | INHALATION_SOLUTION | RESPIRATORY_TRACT | Status: DC | PRN
Start: 2018-08-25 — End: 2018-09-04

## 2018-08-25 MED ORDER — SODIUM CHLORIDE 0.9 % INTRAVENOUS SOLUTION
1.50 g | Freq: Four times a day (QID) | INTRAVENOUS | Status: DC
Start: 2018-08-25 — End: 2018-08-30
  Administered 2018-08-25: 0 g via INTRAVENOUS
  Administered 2018-08-25 – 2018-08-26 (×2): 1.5 g via INTRAVENOUS
  Administered 2018-08-26: 0 g via INTRAVENOUS
  Administered 2018-08-26 (×2): 1.5 g via INTRAVENOUS
  Administered 2018-08-26: 0 g via INTRAVENOUS
  Administered 2018-08-26: 1.5 g via INTRAVENOUS
  Administered 2018-08-26 (×2): 0 g via INTRAVENOUS
  Administered 2018-08-27 (×2): 1.5 g via INTRAVENOUS
  Administered 2018-08-27 (×4): 0 g via INTRAVENOUS
  Administered 2018-08-27: 1.5 g via INTRAVENOUS
  Administered 2018-08-28: 0 g via INTRAVENOUS
  Administered 2018-08-28: 1.5 g via INTRAVENOUS
  Administered 2018-08-28: 0 g via INTRAVENOUS
  Administered 2018-08-28: 1.5 g via INTRAVENOUS
  Administered 2018-08-28 (×2): 0 g via INTRAVENOUS
  Administered 2018-08-29 (×2): 1.5 g via INTRAVENOUS
  Administered 2018-08-29: 0 g via INTRAVENOUS
  Administered 2018-08-29: 1.5 g via INTRAVENOUS
  Administered 2018-08-29 (×3): 0 g via INTRAVENOUS
  Administered 2018-08-29 – 2018-08-30 (×3): 1.5 g via INTRAVENOUS
  Administered 2018-08-30 (×3): 0 g via INTRAVENOUS
  Administered 2018-08-30: 1.5 g via INTRAVENOUS
  Filled 2018-08-25 (×24): qty 4

## 2018-08-25 MED ORDER — FUROSEMIDE 10 MG/ML INJECTION SOLUTION
40.00 mg | Freq: Two times a day (BID) | INTRAMUSCULAR | Status: DC
Start: 2018-08-25 — End: 2018-09-01
  Administered 2018-08-25 – 2018-09-01 (×12): 40 mg via INTRAVENOUS
  Filled 2018-08-25 (×17): qty 4

## 2018-08-25 MED ORDER — CITALOPRAM 20 MG TABLET
20.0000 mg | ORAL_TABLET | Freq: Every day | ORAL | Status: DC
Start: 2018-08-26 — End: 2018-09-04
  Administered 2018-08-26 – 2018-09-04 (×7): 20 mg via ORAL
  Filled 2018-08-25 (×11): qty 1

## 2018-08-25 MED ORDER — ONDANSETRON HCL (PF) 4 MG/2 ML INJECTION SOLUTION
4.0000 mg | Freq: Four times a day (QID) | INTRAMUSCULAR | Status: DC | PRN
Start: 2018-08-25 — End: 2018-09-04
  Administered 2018-08-30 – 2018-09-03 (×3): 4 mg via INTRAVENOUS
  Filled 2018-08-25 (×3): qty 2

## 2018-08-25 MED ORDER — LOSARTAN 50 MG TABLET
100.0000 mg | ORAL_TABLET | Freq: Every day | ORAL | Status: DC
Start: 2018-08-26 — End: 2018-09-04
  Administered 2018-08-27 – 2018-09-04 (×8): 100 mg via ORAL
  Filled 2018-08-25 (×12): qty 2

## 2018-08-25 MED ORDER — ATORVASTATIN 10 MG TABLET
10.00 mg | ORAL_TABLET | Freq: Every evening | ORAL | Status: DC
Start: 2018-08-25 — End: 2018-09-04
  Administered 2018-08-25 – 2018-09-03 (×10): 10 mg via ORAL
  Filled 2018-08-25 (×13): qty 1

## 2018-08-25 MED ORDER — HYDROCODONE 5 MG-ACETAMINOPHEN 325 MG TABLET
1.00 | ORAL_TABLET | ORAL | Status: DC | PRN
Start: 2018-08-25 — End: 2018-09-04
  Administered 2018-08-28 – 2018-09-03 (×4): 1 via ORAL
  Filled 2018-08-25 (×4): qty 1

## 2018-08-25 MED ORDER — SODIUM CHLORIDE 0.9 % (FLUSH) INJECTION SYRINGE
3.0000 mL | INJECTION | Freq: Three times a day (TID) | INTRAMUSCULAR | Status: DC
Start: 2018-08-25 — End: 2018-09-04
  Administered 2018-08-25: 0 mL
  Administered 2018-08-26 (×3): 3 mL
  Administered 2018-08-27: 0 mL
  Administered 2018-08-27 – 2018-08-28 (×5): 3 mL
  Administered 2018-08-29 – 2018-08-30 (×4): 0 mL
  Administered 2018-08-30 – 2018-09-02 (×9): 3 mL
  Administered 2018-09-03 – 2018-09-04 (×3): 0 mL
  Administered 2018-09-04: 3 mL

## 2018-08-25 MED ORDER — SODIUM CHLORIDE 0.9 % (FLUSH) INJECTION SYRINGE
3.0000 mL | INJECTION | INTRAMUSCULAR | Status: DC | PRN
Start: 2018-08-25 — End: 2018-09-04

## 2018-08-25 MED ORDER — PANTOPRAZOLE 40 MG TABLET,DELAYED RELEASE
40.00 mg | DELAYED_RELEASE_TABLET | Freq: Every morning | ORAL | Status: DC
Start: 2018-08-26 — End: 2018-09-04
  Administered 2018-08-26 – 2018-09-04 (×9): 40 mg via ORAL
  Filled 2018-08-25 (×12): qty 1

## 2018-08-25 MED ORDER — METOPROLOL SUCCINATE ER 25 MG TABLET,EXTENDED RELEASE 24 HR
25.0000 mg | ORAL_TABLET | Freq: Every day | ORAL | Status: DC
Start: 2018-08-26 — End: 2018-09-04
  Administered 2018-08-26 – 2018-09-04 (×9): 25 mg via ORAL
  Filled 2018-08-25 (×11): qty 1

## 2018-08-25 MED ORDER — ASPIRIN 81 MG TABLET,DELAYED RELEASE
81.0000 mg | DELAYED_RELEASE_TABLET | Freq: Every day | ORAL | Status: DC
Start: 2018-08-26 — End: 2018-09-04
  Administered 2018-08-26 – 2018-09-04 (×10): 81 mg via ORAL
  Filled 2018-08-25 (×11): qty 1

## 2018-08-25 NOTE — H&P (Addendum)
[facility]  Hospitalist Admission H&P    [place], [name], 72 y.o. female  Date of Admission:  08/25/2018  Date of Birth:  June 22, 1946    Information Obtained from: patient  Chief Complaint:  SOB    HPI: [name] is a 72 y.o., White female who presents with [name] from [facility].  She had a fever of 102 at the rehab center where she lives and was transferred to summer school.  Was evaluated there.  She was immediately able to tell me where she was, which hospital and a which town she was in.  She says he is tired.  She was however oriented to date and name and is able to give history.  Foley catheter was placed and is draining straw-colored urine.  Blood pressures and vitals were stable.  She received normal saline 500 mL x1, Rocephin 1 g in 325 of aspirin.  Troponin was found to be mildly elevated.  She has a history of PE in April 2019 and has been on Eliquis 2.5 b.i.d..2        Past Medical History:   Diagnosis Date   . Abdominal hernia 10/18/2017    hx of repair   . Anxiety    . Arthritis    . Asthma    . Atrial fibrillation (CMS HCC)    . Back problem    . Bruises easily    . Cancer (CMS [facility]) 10/18/2017    chemo and radiation completed 09/24/2017   . COPD (chronic obstructive pulmonary disease) (CMS HCC)    . CPAP (continuous positive airway pressure) dependence 10/18/2017    has not used recently   . Depression    . DM (diabetes mellitus) (CMS HCC) 2000    Fasting BG 200's. HGA1C 2018 6   . Edema    . Essential hypertension    . GERD (gastroesophageal reflux disease)    . Headache    . Heartburn    . Hx antineoplastic chemo 2018   . Hx of radiation therapy 2018   . Hypercholesteremia    . Hyperlipidemia    . Migraine 10/18/2017    none recently   . Obesity    . Palpitations    . Pancreatic cancer (CMS [facility]) 03/2017   . Panic attack    . PE (pulmonary thromboembolism) (CMS HCC) 03/29/2018   . Peripheral neuropathy 10/18/2017    knees down, bilateral   . Sleep apnea    . Type 2  diabetes mellitus (CMS HCC) 10/18/2017    Dx 2000 FBS 200s   . Unintentional weight loss 10/18/2017    25 lbs 03/2017   . Wears glasses          Past Surgical History:   Procedure Laterality Date   . CESAREAN SECTION     . HX HERNIA REPAIR     . HX SUBCLAVIAN PORT IMPLANTION      s/p removed   . HX SUBCLAVIAN PORT IMPLANTION     . HX TONSIL AND ADENOIDECTOMY     . HX TONSILLECTOMY     . UMBILICAL HERNIA REPAIR      x2         Medications Prior to Admission     Prescriptions    aspirin (ECOTRIN) 81 mg Oral Tablet, Delayed Release (E.C.)    Take 81 mg by mouth    atorvastatin (LIPITOR) 10 mg Oral Tablet    Take 10 mg by mouth  cetirizine (ZYRTEC) 10 mg Oral Tablet    Take 10 mg by mouth    citalopram (CELEXA) 20 mg Oral Tablet    Take 20 mg by mouth    furosemide (LASIX) 20 mg Oral Tablet    20 mg Once a day     gabapentin (NEURONTIN) 300 mg Oral Capsule    300 mg Once a day     glipiZIDE (GLUCOTROL XL) 10 mg Oral Tablet Extended Rel 24 hr (2)    Take 10 mg by mouth    hydroCHLOROthiazide (HYDRODIURIL) 25 mg Oral Tablet    25 mg Once a day     HYDROcodone-acetaminophen (NORCO) 5-325 mg Oral Tablet    Take 1 Tab by mouth Every 4 hours as needed for Pain    lidocaine-prilocaine (EMLA) 2.5-2.5 % Cream    LORazepam (ATIVAN) 0.5 mg Oral Tablet    0.5 mg Once a day     losartan (COZAAR) 100 mg Oral Tablet    Take 100 mg by mouth Once a day     MetFORMIN (GLUCOPHAGE) 1,000 mg Oral Tablet    1,000 mg Twice daily with food     metoprolol succinate (TOPROL-XL) 50 mg Oral Tablet Sustained Release 24 hr    50 mg Once a day     montelukast (SINGULAIR) 10 mg Oral Tablet    Take 10 mg by mouth    omeprazole (PRILOSEC) 40 mg Oral Capsule, Delayed Release(E.C.)    Take 1 Cap (40 mg total) by mouth Once a day    ondansetron (ZOFRAN) 8 mg Oral Tablet    Take 8 mg by mouth Every 8 hours as needed     potassium chloride (K-DUR) 20 mEq Oral Tab Sust.Rel. Particle/Crystal    Take 20 mEq by mouth    prochlorperazine (COMPAZINE) 10 mg Oral  Tablet    Take 10 mg by mouth    promethazine (PHENERGAN) 25 mg Oral Tablet    Take 25 mg by mouth        Allergies   Allergen Reactions   . Sulfa (Sulfonamides) Itching     Social History     Socioeconomic History   . Marital status: Widowed     Spouse name: Not on file   . Number of children: Not on file   . Years of education: Not on file   . Highest education level: Not on file   Tobacco Use   . Smoking status: Former Smoker     Packs/day: 0.50     Years: 20.00     Pack years: 10.00     Last attempt to quit: 10/18/1993     Years since quitting: 24.8   . Smokeless tobacco: Never Used   Substance and Sexual Activity   . Alcohol use: No   . Drug use: No   Other Topics Concern   . Ability to Walk 1 Flight of Steps without SOB/CP No     Comment: SOB, denies CP   . Ability to Walk 2 Flight of Steps without SOB/CP No   . Ability To Do Own ADL's Yes   . Other Activity Level Yes     Comment: light housework     Past Family History:     Family Medical History:     Problem Relation (Age of Onset)    Diabetes Mother    Heart Disease Mother              ROS: Other than ROS in the  HPI, all other systems were negative.    EXAM:     Constitutional: appears chronically ill and moderately obese  Eyes: Conjunctiva clear., Pupils equal and round.   ENT: ENMT without erythema or injection, mucous membranes moist.  Neck: no thyromegaly and supple, symmetrical, trachea midline  Respiratory: Clear to auscultation bilaterally.   Cardiovascular: regular rate and rhythm;, systolic murmur heard in the left 2nd intercostal space  Gastrointestinal: Soft, non-tender, Bowel sounds normal, non-distended  Genitourinary: Foley in place  Musculoskeletal: Head atraumatic and normocephalic and AROM  Integumentary:  Skin warm and dry and No rashes  Neurologic: Grossly normal  Lymphatic/Immunologic/Hematologic: No lymphadenopathy  Psychiatric: Normal  Extremities-2+ peripheral edema.  No calf tenderness.      LABS:  I have reviewed all lab  results.  Outside labs reviewed and abnormal findings noted.  ProBNP 3 03/18/1999, CK 54, glucose 142, BUN 17, creatinine 0.8, sodium 128, K 3.9, chloride 95, CO2 27, ALT 39, ALP PE 744, AST 84, bilirubin 4.3, albumin 2.2 troponin 0.359 lipase less than 10.   WBC 14.4 hemoglobin 9.7, hematocrit 29.4, platelets 161, lactic acid 1.4  Urinalysis-nitrates positive, WBC 16-25.  EKG-ventricular bigeminy sinus rhythm heart rate of 68.    Radiology Results: Outside records reviewed.       There is no immunization history on file for this patient.    DNR Status:  Prior      ASSESSMENT:    There are no active hospital problems to display for this patient.    DVT/PE Prophylaxis: Apixaban  Disposition Planning: Skilled Nursing Unit    PLAN:    1. Anasarca-start Lasix 40 IV b.i.d..  Etiology could be liver disease/cirrhosis versus congestive heart failure.  Check ultrasound abdomen to look for ascites.  Check echocardiogram.  Will monitor labs closely.  Low albumin state and borderline low platelets and elevated LFTs suggest liver pathology.    2. Pancreatic cancer status post robotic verbal surgery and chemo radiation.  Patient has stop chemotherapy due to side effects.  Currently undergoing rehab in [place].    3. Hypertension-will continue losartan and metoprolol XL at half the dose.  Monitor blood pressure closely.    4.  Recent PE-continue Eliquis 2.5 b.i.d. (Do not know reason for lower dose).    5. Mild confusion/encephalopathy, metabolic-likely related to acute illness and possible infectious process    6. Possible urinary tract infection.  Continue Unasyn.  Has a history of recent E coli UTI.  Will follow repeat urine cultures.    7.Mildly elevated liver function tests-check ultrasound abdomen to rule out obstruction; bilirubin is 4, alk phos in 700s.    8. DVT prophylaxis with apixaban    DNR    Dorathy Kinsman, MD

## 2018-08-25 NOTE — Care Plan (Signed)
Problem: Fall Injury Risk  Goal: Absence of Fall and Fall-Related Injury  Outcome: Ongoing (see interventions/notes)  Intervention: Promote Injury-Free Environment  Flowsheets  Taken 08/25/2018 2323  Safety Promotion/Fall Prevention: activity supervised;fall prevention program maintained;safety round/check completed;nonskid shoes/slippers when out of bed  Taken 08/25/2018 2216  Environmental Safety Modification: assistive device/personal items within reach;clutter free environment maintained;room organization consistent     Problem: Fluid Volume Excess  Goal: Fluid Balance  Outcome: Ongoing (see interventions/notes)  Intervention: Monitor and Manage Hypervolemia  Flowsheets (Taken 08/25/2018 2020)  Skin Protection: adhesive use limited  Fluid/Electrolyte Management: fluids provided     Problem: Pain (Acute Coronary Syndrome)  Goal: Absence of Cardiac-Related Pain  Outcome: Ongoing (see interventions/notes)  Intervention: Manage Cardiac Pain Symptoms  Flowsheets (Taken 08/25/2018 2323)  Chest Pain Intervention: cardiac monitoring continued     Problem: Hemodynamic Instability (Acute Coronary Syndrome)  Goal: Effective Cardiac Pump Function  Outcome: Ongoing (see interventions/notes)     Problem: Tissue Perfusion (Acute Coronary Syndrome)  Goal: Adequate Tissue Perfusion  Outcome: Ongoing (see interventions/notes)  Intervention: Optimize Cardiac Tissue Perfusion  Flowsheets (Taken 08/25/2018 2323)  Airway/Ventilation Management: calming measures promoted  Activity Management: activity adjusted per tolerance; activity encouraged; up ad lib  Environmental Support: calm environment promoted     Problem: Diabetes Comorbidity  Goal: Blood Glucose Level Within Desired Range  Outcome: Ongoing (see interventions/notes)  Intervention: Maintain Glycemic Control  Flowsheets (Taken 08/25/2018 2323)  Glycemic Management: blood glucose monitoring

## 2018-08-26 ENCOUNTER — Inpatient Hospital Stay (HOSPITAL_COMMUNITY): Payer: Medicare Other | Admitting: Radiology

## 2018-08-26 ENCOUNTER — Inpatient Hospital Stay (HOSPITAL_COMMUNITY): Payer: Medicare Other

## 2018-08-26 DIAGNOSIS — J9690 Respiratory failure, unspecified, unspecified whether with hypoxia or hypercapnia: Secondary | ICD-10-CM

## 2018-08-26 DIAGNOSIS — E119 Type 2 diabetes mellitus without complications: Secondary | ICD-10-CM

## 2018-08-26 DIAGNOSIS — I509 Heart failure, unspecified: Secondary | ICD-10-CM

## 2018-08-26 DIAGNOSIS — R0600 Dyspnea, unspecified: Secondary | ICD-10-CM

## 2018-08-26 DIAGNOSIS — I21A1 Myocardial infarction type 2: Secondary | ICD-10-CM

## 2018-08-26 DIAGNOSIS — C259 Malignant neoplasm of pancreas, unspecified: Secondary | ICD-10-CM

## 2018-08-26 DIAGNOSIS — I10 Essential (primary) hypertension: Secondary | ICD-10-CM

## 2018-08-26 LAB — CBC WITH DIFF
BASOPHIL #: 0 x10ˆ3/uL (ref 0.00–0.20)
BASOPHIL %: 0 %
EOSINOPHIL #: 0 x10ˆ3/uL (ref 0.00–0.50)
EOSINOPHIL %: 0 %
HCT: 25.7 % — ABNORMAL LOW (ref 34.6–46.2)
HGB: 8.7 g/dL — ABNORMAL LOW (ref 11.8–15.8)
HGB: 8.7 g/dL — ABNORMAL LOW (ref 11.8–15.8)
LYMPHOCYTE #: 0.6 x10ˆ3/uL — ABNORMAL LOW (ref 0.90–3.40)
LYMPHOCYTE %: 5 %
MCH: 31.4 pg (ref 27.6–33.2)
MCHC: 33.8 g/dL (ref 32.6–35.4)
MCV: 92.9 fL (ref 82.3–96.7)
MONOCYTE #: 0.8 x10ˆ3/uL (ref 0.20–0.90)
MONOCYTE %: 8 %
MPV: 9.9 fL (ref 6.6–10.2)
NEUTROPHIL #: 8.9 x10ˆ3/uL — ABNORMAL HIGH (ref 1.50–6.40)
NEUTROPHIL %: 86 %
PLATELETS: 159 x10ˆ3/uL (ref 140–440)
RBC: 2.76 10*6/uL — ABNORMAL LOW (ref 3.80–5.24)
RDW: 15.2 % (ref 12.4–15.2)
WBC: 10.3 10*3/uL (ref 3.5–10.3)
WBC: 10.3 x10ˆ3/uL (ref 3.5–10.3)

## 2018-08-26 LAB — TROPONIN-I
TROPONIN I: 0.45 ng/mL (ref <=0.04)
TROPONIN I: 0.45 ng/mL (ref <=0.04)
TROPONIN I: 0.46 ng/mL (ref <=0.04)

## 2018-08-26 LAB — COMPREHENSIVE METABOLIC PANEL, NON-FASTING
ALBUMIN: 1.5 g/dL — ABNORMAL LOW (ref 3.2–4.6)
ALKALINE PHOSPHATASE: 574 U/L — ABNORMAL HIGH (ref 20–130)
ALT (SGPT): 24 U/L (ref <=52)
ANION GAP: 5 mmol/L
AST (SGOT): 39 U/L — ABNORMAL HIGH (ref <=35)
BILIRUBIN TOTAL: 3.3 mg/dL — ABNORMAL HIGH (ref 0.3–1.2)
BUN/CREA RATIO: 19
BUN: 17 mg/dL (ref 10–25)
CALCIUM: 7.2 mg/dL — ABNORMAL LOW (ref 8.8–10.3)
CHLORIDE: 95 mmol/L — ABNORMAL LOW (ref 98–111)
CO2 TOTAL: 31 mmol/L (ref 21–35)
CREATININE: 0.89 mg/dL (ref <=1.30)
ESTIMATED GFR: >60 mL/min/{1.73_m2}
GLUCOSE: 142 mg/dL — ABNORMAL HIGH (ref 70–110)
POTASSIUM: 3.6 mmol/L (ref 3.5–5.0)
POTASSIUM: 3.6 mmol/L (ref 3.5–5.0)
PROTEIN TOTAL: 3.2 g/dL — ABNORMAL LOW (ref 6.0–8.3)
SODIUM: 131 mmol/L — ABNORMAL LOW (ref 135–145)

## 2018-08-26 LAB — ECG 12 LEAD - ADULT
Calculated P Axis: 41 deg
Calculated T Axis: 32 deg
Heart Rate: 76 {beats}/min
Heart Rate: 76 {beats}/min
I 40 Axis: 35 deg
PR Interval: 170 ms
QRS Axis: -15 deg
QRS Duration: 101 ms
QT Interval: 448 ms
QTC Calculation: 504 ms
ST Axis: 41 deg

## 2018-08-26 LAB — POC BLOOD GLUCOSE (RESULTS)
GLUCOSE, POC: 114 mg/dL — ABNORMAL HIGH (ref 70–110)
GLUCOSE, POC: 113 mg/dL — ABNORMAL HIGH (ref 70–110)

## 2018-08-26 LAB — CBC
HCT: 29.6 % — ABNORMAL LOW (ref 34.6–46.2)
HGB: 9.9 g/dL — ABNORMAL LOW (ref 11.8–15.8)
MCH: 31.1 pg (ref 27.6–33.2)
MCHC: 33.4 g/dL (ref 32.6–35.4)
MCV: 93.1 fL (ref 82.3–96.7)
MPV: 10 fL (ref 6.6–10.2)
PLATELETS: 157 10*3/uL (ref 140–440)
RBC: 3.18 x10ˆ6/uL — ABNORMAL LOW (ref 3.80–5.24)
RDW: 15.1 % (ref 12.4–15.2)
WBC: 13.6 10*3/uL — ABNORMAL HIGH (ref 3.5–10.3)

## 2018-08-26 LAB — PTT (PARTIAL THROMBOPLASTIN TIME)
APTT: 36.5 s (ref 25.0–37.0)
APTT: 131.1 s — ABNORMAL HIGH (ref 25.0–37.0)
APTT: 107.2 s — ABNORMAL HIGH (ref 25.0–37.0)

## 2018-08-26 LAB — MAGNESIUM: MAGNESIUM: 1.2 mg/dL — ABNORMAL LOW (ref 1.8–2.3)

## 2018-08-26 LAB — PHOSPHORUS: PHOSPHORUS: 3.3 mg/dL (ref 2.5–4.5)

## 2018-08-26 MED ORDER — APIXABAN 2.5 MG TABLET
2.50 mg | ORAL_TABLET | Freq: Two times a day (BID) | ORAL | Status: DC
Start: 2018-08-27 — End: 2018-09-04
  Administered 2018-08-27 – 2018-09-04 (×11): 2.5 mg via ORAL
  Filled 2018-08-26 (×17): qty 1

## 2018-08-26 MED ORDER — HEPARIN (PORCINE) 25,000 UNIT/250 ML (100 UNIT/ML) IN DEXTROSE 5 % IV
12.0000 [IU]/kg/h | INTRAVENOUS | Status: AC
Start: 2018-08-26 — End: 2018-08-26
  Administered 2018-08-26: 9 [IU]/kg/h via INTRAVENOUS
  Administered 2018-08-26: 0 [IU]/kg/h via INTRAVENOUS
  Administered 2018-08-26: 12 [IU]/kg/h via INTRAVENOUS
  Administered 2018-08-26: 9 [IU]/kg/h via INTRAVENOUS
  Administered 2018-08-26: 8 [IU]/kg/h via INTRAVENOUS
  Administered 2018-08-27: 0 [IU]/kg/h via INTRAVENOUS
  Filled 2018-08-26: qty 250

## 2018-08-26 MED ORDER — APIXABAN 2.5 MG TABLET
2.50 mg | ORAL_TABLET | Freq: Two times a day (BID) | ORAL | Status: DC
Start: 2018-08-26 — End: 2018-08-26

## 2018-08-26 MED ADMIN — DEXTROSE 10% E48 W/ ADDITIVES: @ 14:00:00

## 2018-08-26 MED ADMIN — sodium chloride 0.9 % (flush) injection syringe: ORAL | @ 02:00:00

## 2018-08-26 MED ADMIN — lactated Ringers intravenous solution: INTRAVENOUS | @ 10:00:00 | NDC 00338011704

## 2018-08-26 NOTE — Nurses Notes (Signed)
Pt lying in bed resting and is oriented x4. VSS. Port accessed at the beginning of the shift. Troponin resulted higher than initial, but pt was not symptomatic.Dr. Geni Bers notified and ordered an EKG and instructed me to start the pt on a heparin drip. Heparin in is currently infusing via pump at 12 units/kg/hr. APTT orders for 0900. Pt appears to be stable at this time. Will continue to monitor.    Marice Potter, RN  08/26/2018, 05:21

## 2018-08-26 NOTE — Care Plan (Signed)
Problem: Fall Injury Risk  Goal: Absence of Fall and Fall-Related Injury  Intervention: Identify and Manage Contributors to Fall Injury Risk  Flowsheets  Taken 08/25/2018 2020 by Marice Potter, RN  Self-Care Promotion: independence encouraged  Taken 08/26/2018 0845 by Abby Potash, RN  Medication Review/Management: medications reviewed     Problem: Fall Injury Risk  Goal: Absence of Fall and Fall-Related Injury  Intervention: Promote Injury-Free Environment  Flowsheets (Taken 08/26/2018 1800)  Safety Promotion/Fall Prevention: activity supervised;fall prevention program maintained;nonskid shoes/slippers when out of bed;safety round/check completed  Environmental Safety Modification: assistive device/personal items within reach;clutter free environment maintained     Problem: Fluid Volume Excess  Goal: Fluid Balance  Intervention: Monitor and Manage Hypervolemia  Flowsheets  Taken 08/26/2018 0845 by Abby Potash, RN  Skin Protection: adhesive use limited  Taken 08/25/2018 2020 by Marice Potter, RN  Fluid/Electrolyte Management: fluids provided     Problem: Pain (Acute Coronary Syndrome)  Goal: Absence of Cardiac-Related Pain  Intervention: Manage Cardiac Pain Symptoms  Flowsheets (Taken 08/25/2018 2323 by Marice Potter, RN)  Chest Pain Intervention: cardiac monitoring continued     Problem: Hemodynamic Instability (Acute Coronary Syndrome)  Goal: Effective Cardiac Pump Function  Intervention: Optimize Cardiac Function and Blood Flow  Flowsheets  Taken 08/25/2018 2020 by Marice Potter, RN  Fluid/Electrolyte Management: fluids provided  Taken 08/26/2018 0845 by Abby Potash, RN  Stabilization Measures: legs elevated     Problem: Tissue Perfusion (Acute Coronary Syndrome)  Goal: Adequate Tissue Perfusion  Intervention: Optimize Cardiac Tissue Perfusion  Flowsheets (Taken 08/25/2018 2323 by Marice Potter, RN)  Airway/Ventilation Management: calming measures promoted  Activity Management: activity adjusted per tolerance;activity  encouraged;up ad lib  Environmental Support: calm environment promoted     Problem: Diabetes Comorbidity  Goal: Blood Glucose Level Within Desired Range  Intervention: Maintain Glycemic Control  Flowsheets (Taken 08/25/2018 2323 by Marice Potter, RN)  Glycemic Management: blood glucose monitoring     Patient has been alert/lethargic and oriented throughout shift. Has remained without any dyspneic episodes or trouble breathing during shift, remains on 2L NC. Patient has right chest port that is accessed, patent, and provides adequate blood return. Site is clean, dry and intact with no signs of edema or erythema.  Patient has remained free of falls this shift. Patient is continuing to work toward discharge goals and outcomes, will continue to monitor.   Filed Vitals:    08/25/18 2300 08/26/18 0532 08/26/18 0845 08/26/18 1500   BP: (!) 146/69 (!) 130/45 132/79 (!) 180/71   Pulse: 63 71 74 73   Resp: 18 18 17 20    Temp: 36.5 C (97.7 F) 36.8 C (98.2 F) 36.4 C (97.5 F) 36.9 C (98.4 F)   SpO2: 100% 100% 100% 100%     Abby Potash, RN  08/26/2018, 18:38

## 2018-08-26 NOTE — Care Plan (Signed)
Problem: Fall Injury Risk  Goal: Absence of Fall and Fall-Related Injury  Outcome: Ongoing (see interventions/notes)     Problem: Fluid Volume Excess  Goal: Fluid Balance  Outcome: Ongoing (see interventions/notes)     Problem: Pain (Acute Coronary Syndrome)  Goal: Absence of Cardiac-Related Pain  Outcome: Ongoing (see interventions/notes)     Problem: Hemodynamic Instability (Acute Coronary Syndrome)  Goal: Effective Cardiac Pump Function  Outcome: Ongoing (see interventions/notes)     Problem: Tissue Perfusion (Acute Coronary Syndrome)  Goal: Adequate Tissue Perfusion  Outcome: Ongoing (see interventions/notes)     Problem: Diabetes Comorbidity  Goal: Blood Glucose Level Within Desired Range  Outcome: Ongoing (see interventions/notes)

## 2018-08-26 NOTE — Consults (Signed)
[facility]  Initial Welch       [id number]       Date of service: 08/26/2018  Date of Admission:  08/25/2018    Provider requesting consult:  [facility] hospitalist service  Reason for consult:  Elevated troponin      HISTORY OF PRESENT ILLNESS:  The patient is a 72 year old lady who is fairly recently diagnosed with pancreatic cancer and has had abdominal surgery for that she does have a history of hypertension along with a history of recent pulmonary embolus.  She has never had an MI that she recall she does have diabetes she does not smoke or drink alcohol she is postmenopausal no family history of premature CAD is noted she has previous history of AFib per the chart although she does not recall that.  She has no history of dyslipidemia.  She has had no stroke in the past she has had some palpitations here recently although has had no syncope with that she is telling me.  Of enzymes are mildly elevated here with a troponin of 0.4 although she has had no chest pain or any other anginal equivalents.  She does have "swelling in my belly" and lower extremity edema which are new although she has had no orthopnea no PND.  No claudication is been noted although she has been very inactive since her diagnosis of pancreatic cancer.    Past Medical History:   Diagnosis Date   . Abdominal hernia 10/18/2017    hx of repair   . Anxiety    . Arthritis    . Asthma    . Atrial fibrillation (CMS HCC)    . Back problem    . Bruises easily    . Cancer (CMS [place]) 10/18/2017    chemo and radiation completed 09/24/2017   . COPD (chronic obstructive pulmonary disease) (CMS HCC)    . CPAP (continuous positive airway pressure) dependence 10/18/2017    has not used recently   . Depression    . DM (diabetes mellitus) (CMS HCC) 2000    Fasting BG 200's. HGA1C 2018 6   . Edema    . Essential hypertension    . GERD (gastroesophageal reflux disease)    . Headache    . Heartburn    . Hx antineoplastic chemo 2018     . Hx of radiation therapy 2018   . Hypercholesteremia    . Hyperlipidemia    . Migraine 10/18/2017    none recently   . Obesity    . Palpitations    . Pancreatic cancer (CMS [place]) 03/2017   . Panic attack    . PE (pulmonary thromboembolism) (CMS HCC) 03/29/2018   . Peripheral neuropathy 10/18/2017    knees down, bilateral   . Sleep apnea    . Type 2 diabetes mellitus (CMS HCC) 10/18/2017    Dx 2000 FBS 200s   . Unintentional weight loss 10/18/2017    25 lbs 03/2017   . Wears glasses            Past Surgical History:   Procedure Laterality Date   . CESAREAN SECTION     . HX HERNIA REPAIR     . HX SUBCLAVIAN PORT IMPLANTION      s/p removed   . HX SUBCLAVIAN PORT IMPLANTION     . HX TONSIL AND ADENOIDECTOMY     . HX TONSILLECTOMY     . UMBILICAL HERNIA REPAIR  x2           Family Medical History:     Problem Relation (Age of Onset)    Diabetes Mother    Heart Disease Mother              Social History     Socioeconomic History   . Marital status: Widowed     Spouse name: Not on file   . Number of children: Not on file   . Years of education: Not on file   . Highest education level: Not on file   Occupational History   . Not on file   Social Needs   . Financial resource strain: Not on file   . Food insecurity:     Worry: Not on file     Inability: Not on file   . Transportation needs:     Medical: Not on file     Non-medical: Not on file   Tobacco Use   . Smoking status: Former Smoker     Packs/day: 0.50     Years: 20.00     Pack years: 10.00     Last attempt to quit: 10/18/1993     Years since quitting: 24.8   . Smokeless tobacco: Never Used   Substance and Sexual Activity   . Alcohol use: No   . Drug use: No   . Sexual activity: Not Currently   Lifestyle   . Physical activity:     Days per week: Not on file     Minutes per session: Not on file   . Stress: Not on file   Relationships   . Social connections:     Talks on phone: Not on file     Gets together: Not on file     Attends religious service: Not on file      Active member of club or organization: Not on file     Attends meetings of clubs or organizations: Not on file     Relationship status: Not on file   . Intimate partner violence:     Fear of current or ex partner: Not on file     Emotionally abused: Not on file     Physically abused: Not on file     Forced sexual activity: Not on file   Other Topics Concern   . Ability to Walk 1 Flight of Steps without SOB/CP No     Comment: SOB, denies CP   . Routine Exercise Not Asked   . Ability to Walk 2 Flight of Steps without SOB/CP No   . Unable to Ambulate Not Asked   . Total Care Not Asked   . Ability To Do Own ADL's Yes   . Uses Walker Not Asked   . Other Activity Level Yes     Comment: light housework   . Uses Cane Not Asked   Social History Narrative   . Not on file       Allergies   Allergen Reactions   . Sulfa (Sulfonamides) Itching       No outpatient medications have been marked as taking for the 08/25/18 encounter [facility] Encounter).       Patient Active Problem List   Diagnosis   . Pancreatic cancer (CMS [place])   . Asthma   . Type 2 diabetes mellitus (CMS HCC)   . Open abdominal wall wound   . Hypokalemia   . PE (pulmonary thromboembolism) (CMS HCC)   . Anasarca  REVIEW OF SYSTEMS:   See HPI above.  Remainder of review of systems negative unless noted above.    PHYSICAL EXAMINATION:  The HEENT exam is negative the neck reveals no JVD no bruit the heart is regular with in frequent extrasystoles lungs are clear although with poor effort diminished bilaterally at the bases abdomen is mildly protuberant extremities have moderate to severe edema bilaterally neurologically she seems alert and oriented although confused about her medical status.  Filed Vitals:    08/25/18 1836 08/25/18 2020 08/25/18 2300 08/26/18 0532   BP: (!) 161/67 (!) 105/94 (!) 146/69 (!) 130/45   Pulse: 55 65 63 71   Resp: 17 16 18 18    Temp: 36.6 C (97.9 F) 36.3 C (97.3 F) 36.5 C (97.7 F) 36.8 C (98.2 F)   SpO2: 100% 100% 100% 100%          Vitals have been reviewed.  Input/Output    Intake/Output Summary (Last 24 hours) at 08/26/2018 0946  Last data filed at 08/26/2018 0600  Gross per 24 hour   Intake 1819.2 ml   Output 1500 ml   Net 319.2 ml       Labs:   CBC  (Last 24 hours)    Date/Time WBC HGB HCT MCV PLATELETS    08/26/18 0608 10.3    8.7 (L)    25.7 (L)    92.9    159       08/26/18 0134 13.6 (H)    9.9 (L)    29.6 (L)    93.1    157         WBC/Diff  (Last 24 hours)    Date/Time WBC Bands PMNs Lymphs Mono Eos Basos    08/26/18 0608 10.3    -- 86    5    8    0    0       08/26/18 0134 13.6 (H)    -- -- -- -- -- --          BMP  (Last 24 hours)    Date/Time Na K Cl CO2 BUN CREAT Calcium Glucose    08/26/18 0608 131 (L)    3.6    95 (L)    31    17    0.89    7.2 (L)    142 (H)           Labs have been reviewed extensively.      Radiology:    Results for orders placed or performed during the hospital encounter of 08/25/18 (from the past 72 hour(s))   XR AP MOBILE CHEST     Status: None    Narrative    Saraya Gaddie    PROCEDURE DESCRIPTION: XR AP MOBILE CHEST    PROCEDURE PERFORMED DATE AND TIME: 08/25/2018 6:48 PM    CLINICAL INDICATION: SOB r/o EDEMA    TECHNIQUE: 1 views / 2 images submitted.    COMPARISON: Chest x-ray dated October 18, 2017      FINDINGS: The heart size is upper limits of normal. There is a right-sided  infusion port catheter. There is no infiltrate or pleural fluid. There are  degenerative changes of the spine and shoulders.      Impression    No acute process        Radiologist location ID: [id number]     [place] RT UPPER QUADRANT     Status: None    Narrative    Doshie  Ahlgrim    [place] RT UPPER QUADRANT performed on 08/26/2018 8:40 AM    INDICATION: 72 years old Female  Pancreatic cancer-elevated LFT status post Whipple procedure.  Please  evaluate pancreas, kidney and liver with portal when  flow.    TECHNIQUE: Transabdominal ultrasound of the right upper quadrant.    COMPARISON: Ultrasound 08/02/2018.    FINDINGS:   Overall  the pancreas is not well evaluated by ultrasound. Pancreas is  incompletely visualized. The included distal body/tail appears atrophic but  otherwise grossly unremarkable. In the location of the pancreatic head with  tip to be located (the patient has a reported history of Whipple  procedure), there is a heterogeneously hypoechoic structure which appears  similar to that of the adjacent liver. This may be reflective of liver  tissue, but mass is not excluded.    The liver is mildly enlarged and demonstrates coarsening of its echotexture  along with slight nodularity along the liver contour. Findings suggest a  degree of underlying fibrosis. Only a segment of the portal vein is  visualized, which demonstrates proper direction of blood flow.    Status post cholecystectomy. Included segment of the proximal common bile  duct is nondilated.    The right kidney measures 10 x 4.6 cm.  There is no evidence  hydronephrosis.    There is a small volume of free perihepatic fluid.    Right pleural effusion.        Impression    1. Pancreas is not well evaluated by ultrasound. In the usual location of  the pancreatic head (which is absent due to Whipple procedure) there is  heterogeneous soft tissue and may be associated with the liver but mass  cannot be excluded by ultrasound. Further evaluation with contrast-enhanced  CT would be required to further evaluate this.  2. Mild hepatomegaly. Slight nodularity along liver edge and slight  coarsening of the hepatic echotexture suggests a degree of underlying  fibrosis.  3. Small volume of right upper quadrant ascites.  4. Right pleural effusion.        Radiologist location ID: [id number]         Assessment  1. Elevated troponin with EKG change consistent with type 2 non ST segment elevation MI    Pancreatic cancer    Hypertension    Recent pulmonary embolus    Diabetes next        Plan  At present I think the patient certainly seems well treated for her elevated enzymes.  Do think  heparin can be discontinued after 24 hours total.  Would reinstitute to her Eliquis at that point.  She is currently DNR wants to remain that way.  Will plan for no further workup from an ischemic standpoint here today she says she just does not want that.  And otherwise I am adding very little to the case will sign off however please re-consult this admission if needed.  Thanks.  Johnsie Kindred, MD            Johnsie Kindred, MD  08/26/2018, 09:46

## 2018-08-26 NOTE — Progress Notes (Signed)
[facility]  Hospitalist Progress Note    [name]       72 y.o.       Date of service: 08/26/2018  Date of Admission:  08/25/2018    Hospital Day:  LOS: 1 day   CC: Anasarca    Subjective:  Feeling better today.  More awake.  Denies any chest pain.  Objective:   Filed Vitals:    08/25/18 2020 08/25/18 2300 08/26/18 0532 08/26/18 0845   BP: (!) 105/94 (!) 146/69 (!) 130/45 132/79   Pulse: 65 63 71 74   Resp: 16 18 18 17    Temp: 36.3 C (97.3 F) 36.5 C (97.7 F) 36.8 C (98.2 F) 36.4 C (97.5 F)   SpO2: 100% 100% 100% 100%     O2 delivery: Nasal Cannula     I have reviewed the vitals.      Input/Output    Intake/Output Summary (Last 24 hours) at 08/26/2018 1559  Last data filed at 08/26/2018 0600  Gross per 24 hour   Intake 1819.2 ml   Output 1500 ml   Net 319.2 ml           Current Facility-Administered Medications:  acetaminophen (TYLENOL) tablet 650 mg Oral Q6H PRN   albuterol (PROVENTIL) 2.5 mg / 3 mL (0.083%) neb solution 2.5 mg Nebulization Q4H PRN   ampicillin-sulbactam (UNASYN) 1.5 g in NS 50 mL IVPB 1.5 g Intravenous Q6H   [START ON 08/27/2018] apixaban (ELIQUIS) tablet 2.5 mg Oral 2x/day   aspirin (ECOTRIN) enteric coated tablet 81 mg 81 mg Oral Daily   atorvastatin (LIPITOR) tablet 10 mg Oral QPM   citalopram (CELEXA) tablet 20 mg Oral Daily   furosemide (LASIX) 10 mg/mL injection 40 mg Intravenous 2x/day   heparin 25,000 units in D5W 250 mL infusion 12 Units/kg/hr (Adjusted) Intravenous Continuous   HYDROcodone-acetaminophen (NORCO) 5-325 mg per tablet 1 Tab Oral Q4H PRN   losartan (COZAAR) tablet 100 mg Oral Daily   metoprolol succinate (TOPROL-XL) 24 hr extended release tablet 25 mg Oral Daily   NS flush syringe 3 mL Intracatheter Q8HRS   NS flush syringe 3 mL Intracatheter Q1H PRN   ondansetron (ZOFRAN) 2 mg/mL injection 4 mg Intravenous Q6H PRN   pantoprazole (PROTONIX) delayed release tablet 40 mg Oral Daily before Breakfast       Physical Exam:  General: No acute distress  HEENT:  Oropharynx is clear without lesions.  Cardiac: Regular rate and rhythm, no murmur auscultated.  Respiratory: Clear to auscultation bilaterally without wheeze.  Abdomen: Positive bowel sounds, soft, nontender  Extremities:  1+ edema    Labs:   Results for orders placed or performed during the hospital encounter of 08/25/18 (from the past 24 hour(s))   TROPONIN-I   Result Value Ref Range    TROPONIN I 0.22 (HH) <=0.04 ng/mL   URINALYSIS WITH REFLEX MICROSCOPIC AND CULTURE IF POSITIVE    Narrative    The following orders were created for panel order URINALYSIS WITH REFLEX MICROSCOPIC AND CULTURE IF POSITIVE.  Procedure                               Abnormality         Status                     ---------                               -----------         ------  URINALYSIS, MACRO/MICRO[[id number]]      Abnormal            Final result                 Please view results for these tests on the individual orders.   SODIUM, RANDOM URINE   Result Value Ref Range    SODIUM RANDOM URINE <10 No known normals mmol/L   OSMOLALITY, RANDOM URINE   Result Value Ref Range    OSMOLALITY URINE 440 250 - 900 mOsm/kg   URINALYSIS, MACRO/MICRO   Result Value Ref Range    COLOR Orange (A) Yellow, Straw, Colorless    APPEARANCE Cloudy Clear, Cloudy    SPECIFIC GRAVITY 1.013 1.010 - 1.025    PH 6.0 5.0 - 7.0    LEUKOCYTES Trace (A) Negative WBCs/uL    NITRITE Positive (A) Negative    PROTEIN Negative Negative, Trace mg/dL    GLUCOSE Negative Negative mg/dL    KETONES Trace (A) Negative mg/dL    UROBILINOGEN 1.0 Normal, 0.2 , 1.0 mg/dL    BILIRUBIN Large (A) Negative mg/dL    BLOOD Negative Negative mg/dL    URINALYSIS COMMENTS STOP     RBCS 3-10 (A) None /hpf    WBCS 10-20 (A) None /hpf    BACTERIA None None /hpf    SQUAMOUS EPITHELIAL Occasional None, Rare, Occasional /hpf   TROPONIN-I   Result Value Ref Range    TROPONIN I 0.46 (HH) <=0.04 ng/mL   CBC/DIFF    Narrative    The following orders were created for panel order  CBC/DIFF.  Procedure                               Abnormality         Status                     ---------                               -----------         ------                     CBC WITH DIFF[[id number]]                Abnormal            Final result                 Please view results for these tests on the individual orders.   COMPREHENSIVE METABOLIC PANEL, NON-FASTING   Result Value Ref Range    SODIUM 131 (L) 135 - 145 mmol/L    POTASSIUM 3.6 3.5 - 5.0 mmol/L    CHLORIDE 95 (L) 98 - 111 mmol/L    CO2 TOTAL 31 21 - 35 mmol/L    ANION GAP 5 mmol/L    BUN 17 10 - 25 mg/dL    CREATININE 0.89 <=1.30 mg/dL    BUN/CREA RATIO 19     ESTIMATED GFR >60 Avg: 75 mL/min/1.20m^2    ALBUMIN 1.5 (L) 3.2 - 4.6 g/dL    CALCIUM 7.2 (L) 8.8 - 10.3 mg/dL    GLUCOSE 142 (H) 70 - 110 mg/dL    ALKALINE PHOSPHATASE 574 (H) 20 - 130 U/L    ALT (SGPT) 24 <=52 U/L    AST (SGOT) 39 (  H) <=35 U/L    BILIRUBIN TOTAL 3.3 (H) 0.3 - 1.2 mg/dL    PROTEIN TOTAL 3.2 (L) 6.0 - 8.3 g/dL   MAGNESIUM   Result Value Ref Range    MAGNESIUM 1.2 (L) 1.8 - 2.3 mg/dL   PHOSPHORUS   Result Value Ref Range    PHOSPHORUS 3.3 2.5 - 4.5 mg/dL   CBC WITH DIFF   Result Value Ref Range    WBC 10.3 3.5 - 10.3 x10^3/uL    RBC 2.76 (L) 3.80 - 5.24 x10^6/uL    HGB 8.7 (L) 11.8 - 15.8 g/dL    HCT 25.7 (L) 34.6 - 46.2 %    MCV 92.9 82.3 - 96.7 fL    MCH 31.4 27.6 - 33.2 pg    MCHC 33.8 32.6 - 35.4 g/dL    RDW 15.2 12.4 - 15.2 %    PLATELETS 159 140 - 440 x10^3/uL    MPV 9.9 6.6 - 10.2 fL    NEUTROPHIL % 86 %    LYMPHOCYTE % 5 %    MONOCYTE % 8 %    EOSINOPHIL % 0 %    BASOPHIL % 0 %    NEUTROPHIL # 8.90 (H) 1.50 - 6.40 x10^3/uL    LYMPHOCYTE # 0.60 (L) 0.90 - 3.40 x10^3/uL    MONOCYTE # 0.80 0.20 - 0.90 x10^3/uL    EOSINOPHIL # 0.00 0.00 - 0.50 x10^3/uL    BASOPHIL # 0.00 0.00 - 0.20 x10^3/uL   CBC   Result Value Ref Range    WBC 13.6 (H) 3.5 - 10.3 x10^3/uL    RBC 3.18 (L) 3.80 - 5.24 x10^6/uL    HGB 9.9 (L) 11.8 - 15.8 g/dL    HCT 29.6 (L) 34.6 - 46.2 %    MCV  93.1 82.3 - 96.7 fL    MCH 31.1 27.6 - 33.2 pg    MCHC 33.4 32.6 - 35.4 g/dL    RDW 15.1 12.4 - 15.2 %    PLATELETS 157 140 - 440 x10^3/uL    MPV 10.0 6.6 - 10.2 fL   PTT (PARTIAL THROMBOPLASTIN TIME)   Result Value Ref Range    APTT 36.5 25.0 - 37.0 seconds   TROPONIN-I   Result Value Ref Range    TROPONIN I 0.45 (HH) <=0.04 ng/mL   PTT (PARTIAL THROMBOPLASTIN TIME)   Result Value Ref Range    APTT 131.1 (H) 25.0 - 37.0 seconds   POC BLOOD GLUCOSE (RESULTS)   Result Value Ref Range    GLUCOSE, POC 120 (H) 70 - 110 mg/dl   POC BLOOD GLUCOSE (RESULTS)   Result Value Ref Range    GLUCOSE, POC 114 (H) 70 - 110 mg/dl         I have reviewed all labs.    Micro: No results found for any visits on 08/25/18 (from the past 96 hour(s)).    Radiology:    Results for orders placed or performed during the hospital encounter of 08/25/18 (from the past 24 hour(s))   XR AP MOBILE CHEST     Status: None    Narrative    Brazil Schiller    PROCEDURE DESCRIPTION: XR AP MOBILE CHEST    PROCEDURE PERFORMED DATE AND TIME: 08/25/2018 6:48 PM    CLINICAL INDICATION: SOB r/o EDEMA    TECHNIQUE: 1 views / 2 images submitted.    COMPARISON: Chest x-ray dated October 18, 2017      FINDINGS: The heart size is upper limits of  normal. There is a right-sided  infusion port catheter. There is no infiltrate or pleural fluid. There are  degenerative changes of the spine and shoulders.      Impression    No acute process        Radiologist location ID: [id number]     [place] RT UPPER QUADRANT     Status: None    Narrative    Shameika Gillette    [place] RT UPPER QUADRANT performed on 08/26/2018 8:40 AM    INDICATION: 72 years old Female  Pancreatic cancer-elevated LFT status post Whipple procedure.  Please  evaluate pancreas, kidney and liver with portal when  flow.    TECHNIQUE: Transabdominal ultrasound of the right upper quadrant.    COMPARISON: Ultrasound 08/02/2018.    FINDINGS:   Overall the pancreas is not well evaluated by ultrasound. Pancreas  is  incompletely visualized. The included distal body/tail appears atrophic but  otherwise grossly unremarkable. In the location of the pancreatic head with  tip to be located (the patient has a reported history of Whipple  procedure), there is a heterogeneously hypoechoic structure which appears  similar to that of the adjacent liver. This may be reflective of liver  tissue, but mass is not excluded.    The liver is mildly enlarged and demonstrates coarsening of its echotexture  along with slight nodularity along the liver contour. Findings suggest a  degree of underlying fibrosis. Only a segment of the portal vein is  visualized, which demonstrates proper direction of blood flow.    Status post cholecystectomy. Included segment of the proximal common bile  duct is nondilated.    The right kidney measures 10 x 4.6 cm.  There is no evidence  hydronephrosis.    There is a small volume of free perihepatic fluid.    Right pleural effusion.        Impression    1. Pancreas is not well evaluated by ultrasound. In the usual location of  the pancreatic head (which is absent due to Whipple procedure) there is  heterogeneous soft tissue and may be associated with the liver but mass  cannot be excluded by ultrasound. Further evaluation with contrast-enhanced  CT would be required to further evaluate this.  2. Mild hepatomegaly. Slight nodularity along liver edge and slight  coarsening of the hepatic echotexture suggests a degree of underlying  fibrosis.  3. Small volume of right upper quadrant ascites.  4. Right pleural effusion.        Radiologist location ID: [id number]         PT/OT: No    Consults:  Cardiology    Assessment/ Plan:   Active Hospital Problems    Diagnosis   . Primary Problem: Anasarca   . PE (pulmonary thromboembolism) (CMS HCC)   . Hypokalemia   . Open abdominal wall wound   . Pancreatic cancer (CMS [place])   . Type 2 diabetes mellitus (CMS HCC)   . Asthma       PLAN:      1. 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 Remains on positive fluid balance  Etiology could be liver disease/cirrhosis versus congestive heart failure.  Favor liver disease.  Echo shows low normal LVEF , ultrasound abdomen shows nodularity of liver..   -add Aldactone.    2. Pancreatic cancer status post robotic verbal surgery and chemo radiation.  Patient has stop chemotherapy due to side effects.  Currently undergoing rehab in [facility]  [place].    3. Hypertension-will continue losartan and metoprolol XL at half the dose.  Titrate back up,  Monitor blood pressure closely.    4.  Recent PE-continue Eliquis 2.5 b.i.d. (Do not know reason for lower dose).    5. Mild confusion/encephalopathy, metabolic-likely related to acute illness and possible infectious process    6. Possible urinary tract infection.  Continue Unasyn.  Has a history of recent E coli UTI.  Will follow repeat urine cultures.    7. elevated liver function tests-continue to follow.  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  Known history of Whipple's procedure.  No obvious obstruction seen on ultrasound.  Consult GI.Marland Kitchen    8. DVT prophylaxis with apixaban    DNR    Dorathy Kinsman, MD

## 2018-08-27 LAB — CBC WITH DIFF
BASOPHIL #: 0 x10ˆ3/uL (ref 0.00–0.20)
BASOPHIL %: 1 %
EOSINOPHIL #: 0.2 x10ˆ3/uL (ref 0.00–0.50)
EOSINOPHIL %: 3 %
EOSINOPHIL %: 3 %
HCT: 26 % — ABNORMAL LOW (ref 34.6–46.2)
HGB: 8.8 g/dL — ABNORMAL LOW (ref 11.8–15.8)
LYMPHOCYTE #: 0.6 x10ˆ3/uL — ABNORMAL LOW (ref 0.90–3.40)
LYMPHOCYTE %: 11 %
MCH: 31.6 pg (ref 27.6–33.2)
MCHC: 33.9 g/dL (ref 32.6–35.4)
MCV: 93.3 fL (ref 82.3–96.7)
MONOCYTE #: 0.6 x10ˆ3/uL (ref 0.20–0.90)
MONOCYTE %: 11 %
MPV: 9.7 fL (ref 6.6–10.2)
NEUTROPHIL #: 3.9 x10ˆ3/uL (ref 1.50–6.40)
NEUTROPHIL %: 74 %
PLATELETS: 161 10*3/uL (ref 140–440)
RBC: 2.79 x10ˆ6/uL — ABNORMAL LOW (ref 3.80–5.24)
RDW: 15 % (ref 12.4–15.2)
WBC: 5.2 x10ˆ3/uL (ref 3.5–10.3)

## 2018-08-27 LAB — BASIC METABOLIC PANEL, FASTING
ANION GAP: 4 mmol/L
BUN/CREA RATIO: 20
BUN: 15 mg/dL (ref 10–25)
CALCIUM: 7.2 mg/dL — ABNORMAL LOW (ref 8.8–10.3)
CHLORIDE: 96 mmol/L — ABNORMAL LOW (ref 98–111)
CO2 TOTAL: 33 mmol/L (ref 21–35)
CREATININE: 0.75 mg/dL (ref <=1.30)
ESTIMATED GFR: >60 mL/min/1.73mˆ2
GLUCOSE: 83 mg/dL (ref 70–110)
POTASSIUM: 3.1 mmol/L — ABNORMAL LOW (ref 3.5–5.0)
SODIUM: 133 mmol/L — ABNORMAL LOW (ref 135–145)

## 2018-08-27 LAB — HEPATIC FUNCTION PANEL
ALBUMIN: 1.5 g/dL — ABNORMAL LOW (ref 3.2–4.6)
ALKALINE PHOSPHATASE: 490 U/L — ABNORMAL HIGH (ref 20–130)
ALT (SGPT): 18 U/L (ref <=52)
AST (SGOT): 25 U/L (ref <=35)
BILIRUBIN DIRECT: 1.1 mg/dL — ABNORMAL HIGH (ref <=0.3)
BILIRUBIN TOTAL: 1.9 mg/dL — ABNORMAL HIGH (ref 0.3–1.2)
PROTEIN TOTAL: 3.3 g/dL — ABNORMAL LOW (ref 6.0–8.3)

## 2018-08-27 LAB — H & H
HCT: 26.5 % — ABNORMAL LOW (ref 34.6–46.2)
HCT: 26.5 % — ABNORMAL LOW (ref 34.6–46.2)
HCT: 25.7 % — ABNORMAL LOW (ref 34.6–46.2)
HGB: 9 g/dL — ABNORMAL LOW (ref 11.8–15.8)
HGB: 8.6 g/dL — ABNORMAL LOW (ref 11.8–15.8)
HGB: 8.6 g/dL — ABNORMAL LOW (ref 11.8–15.8)

## 2018-08-27 LAB — POC BLOOD GLUCOSE (RESULTS)
GLUCOSE, POC: 132 mg/dL — ABNORMAL HIGH (ref 70–110)
GLUCOSE, POC: 507 mg/dL — ABNORMAL HIGH (ref 70–110)
GLUCOSE, POC: 507 mg/dl — ABNORMAL HIGH (ref 70–110)
GLUCOSE, POC: 80 mg/dL (ref 70–110)
GLUCOSE, POC: 299 mg/dL — ABNORMAL HIGH (ref 70–110)

## 2018-08-27 MED ORDER — DIPHENHYDRAMINE 25 MG CAPSULE
25.00 mg | ORAL_CAPSULE | Freq: Every evening | ORAL | Status: DC | PRN
Start: 2018-08-27 — End: 2018-09-04
  Administered 2018-08-27: 25 mg via ORAL
  Filled 2018-08-27: qty 1

## 2018-08-27 MED ORDER — POTASSIUM CHLORIDE ER 20 MEQ TABLET,EXTENDED RELEASE(PART/CRYST)
40.0000 meq | ORAL_TABLET | Freq: Once | ORAL | Status: AC
Start: 2018-08-27 — End: 2018-08-27
  Administered 2018-08-27: 16:00:00 40 meq via ORAL
  Filled 2018-08-27: qty 2

## 2018-08-27 MED ORDER — HYDRALAZINE 10 MG TABLET
10.00 mg | ORAL_TABLET | Freq: Three times a day (TID) | ORAL | Status: DC
Start: 2018-08-27 — End: 2018-09-04
  Administered 2018-08-27 – 2018-09-04 (×24): 10 mg via ORAL
  Filled 2018-08-27 (×30): qty 1

## 2018-08-27 MED ORDER — HYDROCORTISONE 1 %-PRAMOXINE 1 % RECTAL FOAM
1.00 | Freq: Four times a day (QID) | RECTAL | Status: DC
Start: 2018-08-27 — End: 2018-09-04
  Administered 2018-08-27: 0 via RECTAL
  Administered 2018-08-27 – 2018-08-28 (×4): 1 via RECTAL
  Administered 2018-08-28: 0 via RECTAL
  Administered 2018-08-28 – 2018-08-29 (×4): 1 via RECTAL
  Administered 2018-08-29: 0 via RECTAL
  Administered 2018-08-29: 1 via RECTAL
  Administered 2018-08-30 (×2): 0 via RECTAL
  Administered 2018-08-30 – 2018-08-31 (×2): 1 via RECTAL
  Administered 2018-08-31 (×4): 0 via RECTAL
  Administered 2018-09-01 – 2018-09-02 (×6): 1 via RECTAL
  Administered 2018-09-02 – 2018-09-03 (×5): 0 via RECTAL
  Administered 2018-09-04: 1 via RECTAL
  Administered 2018-09-04 (×2): 0 via RECTAL
  Filled 2018-08-27 (×5): qty 10

## 2018-08-27 MED ADMIN — sodium chloride 0.9 % (flush) injection syringe: @ 16:00:00

## 2018-08-27 MED ADMIN — lactated Ringers intravenous solution: ORAL | @ 11:00:00 | NDC 00338011704

## 2018-08-27 NOTE — Nurses Notes (Signed)
Pt is lying in bed resting with eyes closed. VSS. Pt had a BM with blood present and continued to bleed after. Physician notified and orders put in. Pt appears to be stable at this time. Will continue to monitor.    Marice Potter, RN  08/27/2018, 06:41

## 2018-08-27 NOTE — Care Management Notes (Signed)
Attempted to reach [facility] @ [phone] Line busy will attemtpt to call at later time. Stann Ore, CASE MANAGER

## 2018-08-27 NOTE — Care Management Notes (Signed)
PT LIVES WITH HER SISTER AND BROTHER IN LAW. SHE INTENDS TO RETURN THERE AT DISCHARGE. SHE DOES NOT WANT TO RETURN TO [facility] AT DISCHARGE. SPOKE WITH [facility] ADMISSIONS COORDINATOR AT [facility] AND SHE SAYS THE SISTER ALREADY TOOK PATIENTS BELONGINGS HOME AND SHE IS NOT A BED HOLD AT [facility].  PT IS CURRENTLY ALERT AND ORIENTED. WILL NEED PHYSICAL TX CONSULT.

## 2018-08-27 NOTE — Progress Notes (Signed)
[facility]  Hospitalist Progress Note    [name]       72 y.o.       Date of service: 08/27/2018  Date of Admission:  08/25/2018    Hospital Day:  LOS: 2 days   CC: Anasarca    Subjective:      Patient seen and chart reviewed.  No acute complaints at this time.  States that she feels her swelling has gone down significantly.    Objective:   Filed Vitals:    08/26/18 2100 08/27/18 0400 08/27/18 0900 08/27/18 1551   BP: (!) 155/66 (!) 166/65 (!) 147/62 (!) 158/61   Pulse: 65 64 70 67   Resp: _0 Temp: 36.4 C (97.5 F) 37 C (98.6 F) 36.9 C (98.4 F) 37.4 C (99.3 F)   SpO2: 100% 100% 100% 100%     O2 delivery: Nasal Cannula     I have reviewed the vitals.      Input/Output    Intake/Output Summary (Last 24 hours) at 08/27/2018 2053  Last data filed at 08/27/2018 0926  Gross per 24 hour   Intake 870 ml   Output 2125 ml   Net -1255 ml           Current Facility-Administered Medications:  acetaminophen (TYLENOL) tablet 650 mg Oral Q6H PRN   albuterol (PROVENTIL) 2.5 mg / 3 mL (0.083%) neb solution 2.5 mg Nebulization Q4H PRN   ampicillin-sulbactam (UNASYN) 1.5 g in NS 50 mL IVPB 1.5 g Intravenous Q6H   [Held by provider] apixaban (ELIQUIS) tablet 2.5 mg Oral 2x/day   aspirin (ECOTRIN) enteric coated tablet 81 mg 81 mg Oral Daily   atorvastatin (LIPITOR) tablet 10 mg Oral QPM   citalopram (CELEXA) tablet 20 mg Oral Daily   furosemide (LASIX) 10 mg/mL injection 40 mg Intravenous 2x/day   HYDROcodone-acetaminophen (NORCO) 5-325 mg per tablet 1 Tab Oral Q4H PRN   hydrocortisone-pramoxine (PROCTOFOAM HC) 1-1% rectal foam 1 Applicator Rectal E1E   losartan (COZAAR) tablet 100 mg Oral Daily   metoprolol succinate (TOPROL-XL) 24 hr extended release tablet 25 mg Oral Daily   NS flush syringe 3 mL Intracatheter Q8HRS   NS flush syringe 3 mL Intracatheter Q1H PRN   ondansetron (ZOFRAN) 2 mg/mL injection 4 mg Intravenous Q6H PRN   pantoprazole (PROTONIX) delayed release tablet 40 mg Oral Daily before  Breakfast       Physical Exam:  General: No acute distress  HEENT: Oropharynx is clear without lesions.  Cardiac: Regular rate and rhythm, no murmur auscultated.  Respiratory: Clear to auscultation bilaterally without wheeze.  Abdomen: Positive bowel sounds, soft, nontender  Extremities:  Bilateral lower extremity edema noted    Labs:   Results for orders placed or performed during the hospital encounter of 08/25/18 (from the past 24 hour(s))   PTT (PARTIAL THROMBOPLASTIN TIME)   Result Value Ref Range    APTT 107.2 (H) 25.0 - 37.0 seconds   CBC/DIFF    Narrative    The following orders were created for panel order CBC/DIFF.  Procedure                               Abnormality         Status                     ---------                               -----------         ------  CBC WITH DIFF[[id number]]                Abnormal            Final result                 Please view results for these tests on the individual orders.   BASIC METABOLIC PANEL, FASTING   Result Value Ref Range    SODIUM 133 (L) 135 - 145 mmol/L    POTASSIUM 3.1 (L) 3.5 - 5.0 mmol/L    CHLORIDE 96 (L) 98 - 111 mmol/L    CO2 TOTAL 33 21 - 35 mmol/L    ANION GAP 4 mmol/L    CALCIUM 7.2 (L) 8.8 - 10.3 mg/dL    GLUCOSE 83 70 - 110 mg/dL    BUN 15 10 - 25 mg/dL    CREATININE 0.75 <=1.30 mg/dL    BUN/CREA RATIO 20     ESTIMATED GFR >60 Avg: 75 mL/min/1.92m2   HEPATIC FUNCTION PANEL   Result Value Ref Range    ALBUMIN 1.5 (L) 3.2 - 4.6 g/dL    ALKALINE PHOSPHATASE 490 (H) 20 - 130 U/L    ALT (SGPT) 18 <=52 U/L    AST (SGOT) 25 <=35 U/L    BILIRUBIN TOTAL 1.9 (H) 0.3 - 1.2 mg/dL    BILIRUBIN DIRECT 1.1 (H) <=0.3 mg/dL    PROTEIN TOTAL 3.3 (L) 6.0 - 8.3 g/dL   CBC WITH DIFF   Result Value Ref Range    WBC 5.2 3.5 - 10.3 x10^3/uL    RBC 2.79 (L) 3.80 - 5.24 x10^6/uL    HGB 8.8 (L) 11.8 - 15.8 g/dL    HCT 26.0 (L) 34.6 - 46.2 %    MCV 93.3 82.3 - 96.7 fL    MCH 31.6 27.6 - 33.2 pg    MCHC 33.9 32.6 - 35.4 g/dL    RDW 15.0 12.4 - 15.2 %     PLATELETS 161 140 - 440 x10^3/uL    MPV 9.7 6.6 - 10.2 fL    NEUTROPHIL % 74 %    LYMPHOCYTE % 11 %    MONOCYTE % 11 %    EOSINOPHIL % 3 %    BASOPHIL % 1 %    NEUTROPHIL # 3.90 1.50 - 6.40 x10^3/uL    LYMPHOCYTE # 0.60 (L) 0.90 - 3.40 x10^3/uL    MONOCYTE # 0.60 0.20 - 0.90 x10^3/uL    EOSINOPHIL # 0.20 0.00 - 0.50 x10^3/uL    BASOPHIL # 0.00 0.00 - 0.20 x10^3/uL   H & H   Result Value Ref Range    HGB 9.0 (L) 11.8 - 15.8 g/dL    HCT 26.5 (L) 34.6 - 46.2 %   POC BLOOD GLUCOSE (RESULTS)   Result Value Ref Range    GLUCOSE, POC 80 70 - 110 mg/dl   POC BLOOD GLUCOSE (RESULTS)   Result Value Ref Range    GLUCOSE, POC 299 (H) 70 - 110 mg/dl         I have reviewed all labs.    Micro:   Hospital Encounter on 08/25/18 (from the past 96 hour(s))   URINE CULTURE,ROUTINE    Collection Time: 08/25/18  7:29 PM   Culture Result Status    URINE CULTURE No Growth 18-24 hrs. Preliminary       Radiology:       Images in the chart reviewed    Right upper quadrant ultrasound  08/25/2018  IMPRESSION:    1. Pancreas is not well evaluated by ultrasound. In the usual location of  the pancreatic head (which is absent due to Whipple procedure) there is  heterogeneous soft tissue and may be associated with the liver but mass  cannot be excluded by ultrasound. Further evaluation with contrast-enhanced  CT would be required to further evaluate this.  2. Mild hepatomegaly. Slight nodularity along liver edge and slight  coarsening of the hepatic echotexture suggests a degree of underlying  fibrosis.  3. Small volume of right upper quadrant ascites.  4. Right pleural effusion.      PT/OT: No    Consults:  Cardiology    Assessment/ Plan:   Active Hospital Problems    Diagnosis   . Primary Problem: Anasarca   . PE (pulmonary thromboembolism) (CMS HCC)   . Hypokalemia   . Open abdominal wall wound   . Pancreatic cancer (CMS [place])   . Type 2 diabetes mellitus (CMS HCC)   . Asthma       PLAN:      Anasarca  - right upper quadrant ultrasound  showing some her hepatomegaly and minimal changes consistent with cirrhosis  -she is status post Whipple procedure  -she is diuresing well with IV Lasix  -will continue this  -continue monitor renal function  - continue Aldactone.      Pancreatic cancer  - status post robotic surgery and chemo and radiation   - she stop the chemotherapy due to side effects  - follow up Oncology as outpatient      Essential Hypertension  -blood pressure is slightly elevated today.  -will continue losartan and metoprolol at reduced dose  -unable to up titrate her metoprolol at this time due to her borderline heart rate  -continue vital checks      Recent PE  - continue Eliquis 2.5 b.i.d. (she is likely on a lower dose due to her decreased hepatic function)      Mild confusion/encephalopathy, metabolic  -likely related to acute illness and possible infectious process  -could also be due to intracranial edema that is improving with diuresis  - improving  -continue supportive care as well as diuresis as above      Possible urinary tract infection.    - Continue Unasyn.    - will stop after tomorrow.      elevated liver function tests  - continue to follow.    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   - alk phos significantly elevated to 574 yesterday.  Down to 42 today  - Known history of Whipple's procedure.       NSTEMI  -with troponin elevation and peak at 0.46  -will check 1 more troponin to ensure down trend      DVT prophylaxis with apixaban    DNR    Doristine Locks, MD

## 2018-08-28 LAB — TROPONIN-I: TROPONIN I: 0.06 ng/mL (ref <=0.04)

## 2018-08-28 LAB — COMPREHENSIVE METABOLIC PANEL, NON-FASTING
ALBUMIN: 1.7 g/dL — ABNORMAL LOW (ref 3.2–4.6)
ALKALINE PHOSPHATASE: 467 U/L — ABNORMAL HIGH (ref 20–130)
ALT (SGPT): 14 U/L (ref <=52)
ANION GAP: 5 mmol/L
AST (SGOT): 17 U/L (ref <=35)
BILIRUBIN TOTAL: 1.8 mg/dL — ABNORMAL HIGH (ref 0.3–1.2)
BUN/CREA RATIO: 16
BUN: 10 mg/dL (ref 10–25)
CALCIUM: 7.3 mg/dL — ABNORMAL LOW (ref 8.8–10.3)
CHLORIDE: 96 mmol/L — ABNORMAL LOW (ref 98–111)
CO2 TOTAL: 32 mmol/L (ref 21–35)
CREATININE: 0.62 mg/dL (ref <=1.30)
ESTIMATED GFR: >60 mL/min/{1.73_m2}
GLUCOSE: 180 mg/dL — ABNORMAL HIGH (ref 70–110)
POTASSIUM: 2.9 mmol/L — CL (ref 3.5–5.0)
PROTEIN TOTAL: 3.6 g/dL — ABNORMAL LOW (ref 6.0–8.3)
SODIUM: 133 mmol/L — ABNORMAL LOW (ref 135–145)

## 2018-08-28 LAB — POC BLOOD GLUCOSE (RESULTS)
GLUCOSE, POC: 182 mg/dL — ABNORMAL HIGH (ref 70–110)
GLUCOSE, POC: 182 mg/dl — ABNORMAL HIGH (ref 70–110)
GLUCOSE, POC: 167 mg/dL — ABNORMAL HIGH (ref 70–110)
GLUCOSE, POC: 244 mg/dL — ABNORMAL HIGH (ref 70–110)
GLUCOSE, POC: 310 mg/dL — ABNORMAL HIGH (ref 70–110)

## 2018-08-28 LAB — CBC
HCT: 28.6 % — ABNORMAL LOW (ref 34.6–46.2)
HGB: 9.6 g/dL — ABNORMAL LOW (ref 11.8–15.8)
MCH: 31 pg (ref 27.6–33.2)
MCHC: 33.4 g/dL (ref 32.6–35.4)
MCV: 92.9 fL (ref 82.3–96.7)
MCV: 92.9 fL (ref 82.3–96.7)
MPV: 9.2 fL (ref 6.6–10.2)
PLATELETS: 200 10*3/uL (ref 140–440)
RBC: 3.08 10*6/uL — ABNORMAL LOW (ref 3.80–5.24)
RDW: 15 % (ref 12.4–15.2)
WBC: 5.3 10*3/uL (ref 3.5–10.3)

## 2018-08-28 LAB — H & H
HCT: 25.7 % — ABNORMAL LOW (ref 34.6–46.2)
HCT: 25.7 % — ABNORMAL LOW (ref 34.6–46.2)
HCT: 28.4 % — ABNORMAL LOW (ref 34.6–46.2)
HGB: 8.5 g/dL — ABNORMAL LOW (ref 11.8–15.8)
HGB: 8.5 g/dL — ABNORMAL LOW (ref 11.8–15.8)
HGB: 9.6 g/dL — ABNORMAL LOW (ref 11.8–15.8)

## 2018-08-28 LAB — PHOSPHORUS: PHOSPHORUS: 2.6 mg/dL (ref 2.5–4.5)

## 2018-08-28 LAB — URINE CULTURE,ROUTINE: URINE CULTURE: NO GROWTH

## 2018-08-28 LAB — MAGNESIUM: MAGNESIUM: 1.1 mg/dL — ABNORMAL LOW (ref 1.8–2.3)

## 2018-08-28 MED ORDER — MAGNESIUM SULFATE 500 MG/ML (50 %) INJECTION SOLUTION
3.0000 g | Freq: Once | INTRAVENOUS | Status: AC
Start: 2018-08-28 — End: 2018-08-28
  Administered 2018-08-28: 0 g via INTRAVENOUS
  Administered 2018-08-28 (×2): 3 g via INTRAVENOUS
  Filled 2018-08-28: qty 6

## 2018-08-28 MED ORDER — DEXTROSE 50 % IN WATER (D50W) INTRAVENOUS SYRINGE
25.0000 g | INJECTION | INTRAVENOUS | Status: DC | PRN
Start: 2018-08-28 — End: 2018-09-04
  Administered 2018-09-04 (×2): 50 mL via INTRAVENOUS
  Filled 2018-08-28: qty 50

## 2018-08-28 MED ORDER — POTASSIUM CHLORIDE ER 20 MEQ TABLET,EXTENDED RELEASE(PART/CRYST)
40.00 meq | ORAL_TABLET | ORAL | Status: AC
Start: 2018-08-28 — End: 2018-08-28
  Administered 2018-08-28 (×3): 40 meq via ORAL
  Filled 2018-08-28 (×3): qty 2

## 2018-08-28 MED ORDER — INSULIN LISPRO 100 UNIT/ML SUBCUTANEOUS SSIP - ~~LOC~~/GRMC
1.0000 [IU] | Freq: Four times a day (QID) | SUBCUTANEOUS | Status: DC
Start: 2018-08-29 — End: 2018-09-04
  Administered 2018-08-29 (×2): 2 [IU] via SUBCUTANEOUS
  Administered 2018-08-29: 4 [IU] via SUBCUTANEOUS
  Administered 2018-08-29 – 2018-08-30 (×2): 6 [IU] via SUBCUTANEOUS
  Administered 2018-08-30: 2 [IU] via SUBCUTANEOUS
  Administered 2018-08-30: 0 [IU] via SUBCUTANEOUS
  Administered 2018-08-30: 4 [IU] via SUBCUTANEOUS
  Administered 2018-08-31: 0 [IU] via SUBCUTANEOUS
  Administered 2018-08-31: 6 [IU] via SUBCUTANEOUS
  Administered 2018-08-31: 0 [IU] via SUBCUTANEOUS
  Administered 2018-08-31: 6 [IU] via SUBCUTANEOUS
  Administered 2018-09-01: 2 [IU] via SUBCUTANEOUS
  Administered 2018-09-01 (×2): 6 [IU] via SUBCUTANEOUS
  Administered 2018-09-01 – 2018-09-02 (×2): 0 [IU] via SUBCUTANEOUS
  Administered 2018-09-02: 4 [IU] via SUBCUTANEOUS
  Administered 2018-09-02: 6 [IU] via SUBCUTANEOUS
  Administered 2018-09-02: 1 [IU] via SUBCUTANEOUS
  Administered 2018-09-03: 0 [IU] via SUBCUTANEOUS
  Administered 2018-09-03: 6 [IU] via SUBCUTANEOUS
  Administered 2018-09-03: 07:00:00 0 [IU] via SUBCUTANEOUS
  Administered 2018-09-03: 8 [IU] via SUBCUTANEOUS
  Administered 2018-09-04: 11:00:00 2 [IU] via SUBCUTANEOUS
  Administered 2018-09-04: 0 [IU] via SUBCUTANEOUS
  Filled 2018-08-28 (×2): qty 300

## 2018-08-28 MED ADMIN — apixaban 2.5 mg tablet: @ 09:00:00

## 2018-08-28 MED ADMIN — nystatin 100,000 unit/gram topical ointment: @ 15:00:00 | NDC 45802004835

## 2018-08-28 MED ADMIN — metoclopramide 5 mg/mL injection solution: RECTAL | @ 12:00:00

## 2018-08-28 MED ADMIN — sodium chloride 0.9 % intravenous solution: INTRAVENOUS | @ 04:00:00 | NDC 00338004904

## 2018-08-28 NOTE — Progress Notes (Addendum)
[facility]  Hospitalist Progress Note    [name]       72 y.o.       Date of service: 08/28/2018  Date of Admission:  08/25/2018    Hospital Day:  LOS: 3 days   CC: Anasarca    Subjective:      Patient seen and chart reviewed.  No acute complaints at this time.  Improving with diuresis       Objective:   Filed Vitals:    08/28/18 0800 08/28/18 1030 08/28/18 1600 08/28/18 2008   BP: (!) 164/50 (!) 143/43 (!) 157/59 (!) 151/71   Pulse: 44 62 69 74   Resp: '16 18 16 16   ' Temp: 36.9 C (98.4 F) 37.1 C (98.8 F) 37 C (98.6 F) 36.7 C (98.1 F)   SpO2: 100% 98% 100% 100%     O2 delivery: Nasal Cannula     I have reviewed the vitals.      Input/Output    Intake/Output Summary (Last 24 hours) at 08/28/2018 2039  Last data filed at 08/28/2018 1900  Gross per 24 hour   Intake 720 ml   Output 3400 ml   Net -2680 ml           Current Facility-Administered Medications:  acetaminophen (TYLENOL) tablet 650 mg Oral Q6H PRN   albuterol (PROVENTIL) 2.5 mg / 3 mL (0.083%) neb solution 2.5 mg Nebulization Q4H PRN   ampicillin-sulbactam (UNASYN) 1.5 g in NS 50 mL IVPB 1.5 g Intravenous Q6H   [Held by provider] apixaban (ELIQUIS) tablet 2.5 mg Oral 2x/day   aspirin (ECOTRIN) enteric coated tablet 81 mg 81 mg Oral Daily   atorvastatin (LIPITOR) tablet 10 mg Oral QPM   citalopram (CELEXA) tablet 20 mg Oral Daily   diphenhydrAMINE (BENADRYL) capsule 25 mg Oral HS PRN   furosemide (LASIX) 10 mg/mL injection 40 mg Intravenous 2x/day   hydrALAZINE (APRESOLINE) tablet 10 mg Oral Q8HRS   HYDROcodone-acetaminophen (NORCO) 5-325 mg per tablet 1 Tab Oral Q4H PRN   hydrocortisone-pramoxine (PROCTOFOAM HC) 1-1% rectal foam 1 Applicator Rectal O2U   losartan (COZAAR) tablet 100 mg Oral Daily   metoprolol succinate (TOPROL-XL) 24 hr extended release tablet 25 mg Oral Daily   NS flush syringe 3 mL Intracatheter Q8HRS   NS flush syringe 3 mL Intracatheter Q1H PRN   ondansetron (ZOFRAN) 2 mg/mL injection 4 mg Intravenous Q6H PRN      pantoprazole (PROTONIX) delayed release tablet 40 mg Oral Daily before Breakfast       Physical Exam:  General: No acute distress  HEENT: Oropharynx is clear without lesions.  Cardiac: Regular rate and rhythm, no murmur auscultated.  Respiratory: Clear to auscultation bilaterally without wheeze.  Abdomen: Positive bowel sounds, soft, nontender  Extremities:  Bilateral lower extremity edema still present     Labs:   Results for orders placed or performed during the hospital encounter of 08/25/18 (from the past 24 hour(s))   H & H   Result Value Ref Range    HGB 8.6 (L) 11.8 - 15.8 g/dL    HCT 25.7 (L) 34.6 - 46.2 %   H & H   Result Value Ref Range    HGB 8.5 (L) 11.8 - 15.8 g/dL    HCT 25.7 (L) 34.6 - 46.2 %   H & H   Result Value Ref Range    HGB 9.6 (L) 11.8 - 15.8 g/dL    HCT 28.4 (L) 34.6 - 46.2 %  TROPONIN-I   Result Value Ref Range    TROPONIN I 0.06 (HH) <=0.04 ng/mL   PHOSPHORUS   Result Value Ref Range    PHOSPHORUS 2.6 2.5 - 4.5 mg/dL   CBC   Result Value Ref Range    WBC 5.3 3.5 - 10.3 x10^3/uL    RBC 3.08 (L) 3.80 - 5.24 x10^6/uL    HGB 9.6 (L) 11.8 - 15.8 g/dL    HCT 28.6 (L) 34.6 - 46.2 %    MCV 92.9 82.3 - 96.7 fL    MCH 31.0 27.6 - 33.2 pg    MCHC 33.4 32.6 - 35.4 g/dL    RDW 15.0 12.4 - 15.2 %    PLATELETS 200 140 - 440 x10^3/uL    MPV 9.2 6.6 - 10.2 fL   MAGNESIUM   Result Value Ref Range    MAGNESIUM 1.1 (L) 1.8 - 2.3 mg/dL   COMPREHENSIVE METABOLIC PANEL, NON-FASTING   Result Value Ref Range    SODIUM 133 (L) 135 - 145 mmol/L    POTASSIUM 2.9 (LL) 3.5 - 5.0 mmol/L    CHLORIDE 96 (L) 98 - 111 mmol/L    CO2 TOTAL 32 21 - 35 mmol/L    ANION GAP 5 mmol/L    BUN 10 10 - 25 mg/dL    CREATININE 0.62 <=1.30 mg/dL    BUN/CREA RATIO 16     ESTIMATED GFR >60 Avg: 75 mL/min/1.56m2    ALBUMIN 1.7 (L) 3.2 - 4.6 g/dL    CALCIUM 7.3 (L) 8.8 - 10.3 mg/dL    GLUCOSE 180 (H) 70 - 110 mg/dL    ALKALINE PHOSPHATASE 467 (H) 20 - 130 U/L    ALT (SGPT) 14 <=52 U/L    AST (SGOT) 17 <=35 U/L    BILIRUBIN TOTAL 1.8 (H)  0.3 - 1.2 mg/dL    PROTEIN TOTAL 3.6 (L) 6.0 - 8.3 g/dL   POC BLOOD GLUCOSE (RESULTS)   Result Value Ref Range    GLUCOSE, POC 182 (H) 70 - 110 mg/dl   POC BLOOD GLUCOSE (RESULTS)   Result Value Ref Range    GLUCOSE, POC 167 (H) 70 - 110 mg/dl   POC BLOOD GLUCOSE (RESULTS)   Result Value Ref Range    GLUCOSE, POC 244 (H) 70 - 110 mg/dl         I have reviewed all labs.    Micro:   Hospital Encounter on 08/25/18 (from the past 96 hour(s))   URINE CULTURE,ROUTINE    Collection Time: 08/25/18  7:29 PM   Culture Result Status    URINE CULTURE No Growth 2 Days Final       Radiology:       Images in the chart reviewed    Right upper quadrant ultrasound  08/25/2018    IMPRESSION:    1. Pancreas is not well evaluated by ultrasound. In the usual location of  the pancreatic head (which is absent due to Whipple procedure) there is  heterogeneous soft tissue and may be associated with the liver but mass  cannot be excluded by ultrasound. Further evaluation with contrast-enhanced  CT would be required to further evaluate this.  2. Mild hepatomegaly. Slight nodularity along liver edge and slight  coarsening of the hepatic echotexture suggests a degree of underlying  fibrosis.  3. Small volume of right upper quadrant ascites.  4. Right pleural effusion.      PT/OT: No    Consults:  Cardiology    Assessment/ Plan:  Active Hospital Problems    Diagnosis   . Primary Problem: Anasarca   . PE (pulmonary thromboembolism) (CMS HCC)   . Hypokalemia   . Open abdominal wall wound   . Pancreatic cancer (CMS [place])   . Type 2 diabetes mellitus (CMS HCC)   . Asthma       PLAN:      Anasarca  - right upper quadrant ultrasound showing some her hepatomegaly and minimal changes consistent with cirrhosis  -she is status post Whipple procedure  -she continues to diurese well with IV Lasix  -continue monitor renal function  - continue Aldactone.      Pancreatic cancer  - status post robotic surgery and chemo and radiation   - she stop the  chemotherapy due to side effects  - follow up Oncology as outpatient      Essential Hypertension  -blood pressure is slightly elevated today.  -will continue losartan and metoprolol at reduced dose  -unable to up titrate her metoprolol at this time due to her borderline heart rate  - add hydralazine   -continue vital checks      Recent PE  - Eliquis 2.5 b.i.d.  Currently being held  - will restart in the am       Mild confusion/encephalopathy, metabolic  -likely related to acute illness and possible infectious process  -could also be due to intracranial edema that is improving with diuresis  - improving  -continue supportive care as well as diuresis as above      Possible urinary tract infection.    - stop Unasyn today       Elevated liver function tests  - continue to follow.    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   - alk phos significantly elevated to 467 today   - common for alk phos to be elevated post whipple       NSTEMI, type II  -with troponin elevation and peak at 0.46  - troponin down to 0.06      DVT prophylaxis with apixaban    DNR    Doristine Locks, MD

## 2018-08-28 NOTE — Care Plan (Signed)
Problem: Fall Injury Risk  Goal: Absence of Fall and Fall-Related Injury  08/28/2018 1916 by Bernadette Hoit, RN  Outcome: Ongoing (see interventions/notes)  08/28/2018 1915 by Bernadette Hoit, RN  Outcome: Ongoing (see interventions/notes)     Problem: Fluid Volume Excess  Goal: Fluid Balance  08/28/2018 1916 by Bernadette Hoit, RN  Outcome: Ongoing (see interventions/notes)  08/28/2018 1915 by Bernadette Hoit, RN  Outcome: Ongoing (see interventions/notes)     Problem: Pain (Acute Coronary Syndrome)  Goal: Absence of Cardiac-Related Pain  08/28/2018 1916 by Bernadette Hoit, RN  Outcome: Ongoing (see interventions/notes)  08/28/2018 1915 by Bernadette Hoit, RN  Outcome: Ongoing (see interventions/notes)     Problem: Hemodynamic Instability (Acute Coronary Syndrome)  Goal: Effective Cardiac Pump Function  08/28/2018 1916 by Bernadette Hoit, RN  Outcome: Ongoing (see interventions/notes)  08/28/2018 1915 by Bernadette Hoit, RN  Outcome: Ongoing (see interventions/notes)     Problem: Tissue Perfusion (Acute Coronary Syndrome)  Goal: Adequate Tissue Perfusion  08/28/2018 1916 by Bernadette Hoit, RN  Outcome: Ongoing (see interventions/notes)  08/28/2018 1915 by Bernadette Hoit, RN  Outcome: Ongoing (see interventions/notes)     Problem: Diabetes Comorbidity  Goal: Blood Glucose Level Within Desired Range  08/28/2018 1916 by Bernadette Hoit, RN  Outcome: Ongoing (see interventions/notes)  08/28/2018 1915 by Bernadette Hoit, RN  Outcome: Ongoing (see interventions/notes)     Problem: Skin Injury Risk Increased  Goal: Skin Health and Integrity  08/28/2018 1916 by Bernadette Hoit, RN  Outcome: Ongoing (see interventions/notes)  08/28/2018 1915 by Bernadette Hoit, RN  Outcome: Ongoing (see interventions/notes)        Patient is alert and verbal. No falls or injuries to report this shift. Critical lab value for K this morning, 3 doses of K administered. Completed all medication without difficulty.   Bernadette Hoit, RN  08/28/2018,  19:19

## 2018-08-28 NOTE — Care Plan (Signed)
Problem: Fall Injury Risk  Goal: Absence of Fall and Fall-Related Injury  Outcome: Ongoing (see interventions/notes)     Problem: Pain (Acute Coronary Syndrome)  Goal: Absence of Cardiac-Related Pain  Outcome: Ongoing (see interventions/notes)     Problem: Skin Injury Risk Increased  Goal: Skin Health and Integrity  Outcome: Ongoing (see interventions/notes)

## 2018-08-28 NOTE — Care Plan (Signed)
Pt is alert and oriented and remains free from falls. Pt is fecal incontinent and had bowel movements several times. Pain medication was given once. No other complaints voiced. Foley is maintained. This patient currently meets requirements for low to mid level nursing care. The patient will continue to be monitored and re-evaluated for any changes in condition.    -Evonnie Pat, RN

## 2018-08-29 DIAGNOSIS — N390 Urinary tract infection, site not specified: Secondary | ICD-10-CM

## 2018-08-29 LAB — CBC
HCT: 27 % — ABNORMAL LOW (ref 34.6–46.2)
HGB: 9.1 g/dL — ABNORMAL LOW (ref 11.8–15.8)
MCH: 31.5 pg (ref 27.6–33.2)
MCHC: 33.5 g/dL (ref 32.6–35.4)
MCV: 93.8 fL (ref 82.3–96.7)
MPV: 9.6 fL (ref 6.6–10.2)
PLATELETS: 194 10*3/uL (ref 140–440)
PLATELETS: 194 10*3/uL (ref 140–440)
RBC: 2.88 10*6/uL — ABNORMAL LOW (ref 3.80–5.24)
RDW: 15 % (ref 12.4–15.2)
WBC: 5.4 10*3/uL (ref 3.5–10.3)

## 2018-08-29 LAB — COMPREHENSIVE METABOLIC PANEL, NON-FASTING
ALBUMIN: 1.8 g/dL — ABNORMAL LOW (ref 3.2–4.6)
ALKALINE PHOSPHATASE: 407 U/L — ABNORMAL HIGH (ref 20–130)
ALT (SGPT): 11 U/L (ref <=52)
ANION GAP: 4 mmol/L
AST (SGOT): 11 U/L (ref <=35)
BILIRUBIN TOTAL: 1.5 mg/dL — ABNORMAL HIGH (ref 0.3–1.2)
BUN/CREA RATIO: 13
BUN: 8 mg/dL — ABNORMAL LOW (ref 10–25)
CALCIUM: 7.1 mg/dL — ABNORMAL LOW (ref 8.8–10.3)
CHLORIDE: 97 mmol/L — ABNORMAL LOW (ref 98–111)
CO2 TOTAL: 30 mmol/L (ref 21–35)
CREATININE: 0.6 mg/dL (ref <=1.30)
ESTIMATED GFR: >60 mL/min/{1.73_m2}
GLUCOSE: 238 mg/dL — ABNORMAL HIGH (ref 70–110)
POTASSIUM: 3.3 mmol/L — ABNORMAL LOW (ref 3.5–5.0)
PROTEIN TOTAL: 3.5 g/dL — ABNORMAL LOW (ref 6.0–8.3)
SODIUM: 131 mmol/L — ABNORMAL LOW (ref 135–145)
SODIUM: 131 mmol/L — ABNORMAL LOW (ref 135–145)

## 2018-08-29 LAB — MAGNESIUM: MAGNESIUM: 1.4 mg/dL — ABNORMAL LOW (ref 1.8–2.3)

## 2018-08-29 LAB — POC BLOOD GLUCOSE (RESULTS)
GLUCOSE, POC: 189 mg/dL — ABNORMAL HIGH (ref 70–110)
GLUCOSE, POC: 162 mg/dL — ABNORMAL HIGH (ref 70–110)
GLUCOSE, POC: 291 mg/dL — ABNORMAL HIGH (ref 70–110)
GLUCOSE, POC: 238 mg/dL — ABNORMAL HIGH (ref 70–110)
GLUCOSE, POC: 238 mg/dl — ABNORMAL HIGH (ref 70–110)

## 2018-08-29 LAB — PHOSPHORUS: PHOSPHORUS: 2.3 mg/dL — ABNORMAL LOW (ref 2.5–4.5)

## 2018-08-29 MED ORDER — SODIUM CHLORIDE 0.9 % INTRAVENOUS SOLUTION
30.0000 mmol | Freq: Once | INTRAVENOUS | Status: AC
Start: 2018-08-29 — End: 2018-08-29
  Administered 2018-08-29: 30 mmol via INTRAVENOUS
  Administered 2018-08-29: 0 mmol via INTRAVENOUS
  Filled 2018-08-29: qty 10

## 2018-08-29 MED ADMIN — white petrolatum 41 % topical ointment: ORAL | @ 10:00:00 | NDC 7214045231

## 2018-08-29 MED ADMIN — sodium chloride 0.9 % intravenous solution: ORAL | @ 08:00:00 | NDC 00338004904

## 2018-08-29 MED ADMIN — midazolam 2 mg/mL oral syrup: ORAL | @ 08:00:00

## 2018-08-29 NOTE — Care Management Notes (Signed)
PHYSICAL THERAPY RECOMMENDS SNF. PT DOES NOT WANT TO RETURN TO ANSTED SNF. SHE GAVE ME PERMISSION TO SEND REFERRAL TO [facility] AND WILL DISCUSS WITH HER SISTER TONIGHT.

## 2018-08-29 NOTE — Progress Notes (Addendum)
[facility]  Hospitalist Progress Note    [name]       72 y.o.       Date of service: 08/29/2018  Date of Admission:  08/25/2018    Hospital Day:  LOS: 4 days   CC: Anasarca    Subjective:      Patient seen and chart reviewed.  No acute complaints at this time.  She continues to improve with diuresis.  She is currently 8 L net negative since admission.      Objective:   Filed Vitals:    08/28/18 1600 08/28/18 2008 08/29/18 0300 08/29/18 0800   BP: (!) 157/59 (!) 151/71 (!) 152/66 (!) 146/57   Pulse: 69 74 72 82   Resp: '16 16 18 16   ' Temp: 37 C (98.6 F) 36.7 C (98.1 F) 36.6 C (97.9 F) 37.2 C (99 F)   SpO2: 100% 100% 100% 100%     O2 delivery: Nasal Cannula     I have reviewed the vitals.      Input/Output    Intake/Output Summary (Last 24 hours) at 08/29/2018 0958  Last data filed at 08/29/2018 0800  Gross per 24 hour   Intake 840 ml   Output 3250 ml   Net -2410 ml           Current Facility-Administered Medications:  acetaminophen (TYLENOL) tablet 650 mg Oral Q6H PRN   albuterol (PROVENTIL) 2.5 mg / 3 mL (0.083%) neb solution 2.5 mg Nebulization Q4H PRN   ampicillin-sulbactam (UNASYN) 1.5 g in NS 50 mL IVPB 1.5 g Intravenous Q6H   [Held by provider] apixaban (ELIQUIS) tablet 2.5 mg Oral 2x/day   aspirin (ECOTRIN) enteric coated tablet 81 mg 81 mg Oral Daily   atorvastatin (LIPITOR) tablet 10 mg Oral QPM   citalopram (CELEXA) tablet 20 mg Oral Daily   dextrose 50% (0.5 g/mL) injection - syringe 25 g Intravenous Q15 Min PRN   diphenhydrAMINE (BENADRYL) capsule 25 mg Oral HS PRN   furosemide (LASIX) 10 mg/mL injection 40 mg Intravenous 2x/day   hydrALAZINE (APRESOLINE) tablet 10 mg Oral Q8HRS   HYDROcodone-acetaminophen (NORCO) 5-325 mg per tablet 1 Tab Oral Q4H PRN   hydrocortisone-pramoxine (PROCTOFOAM HC) 1-1% rectal foam 1 Applicator Rectal H6D   losartan (COZAAR) tablet 100 mg Oral Daily   metoprolol succinate (TOPROL-XL) 24 hr extended release tablet 25 mg Oral Daily   NS flush syringe 3  mL Intracatheter Q8HRS   NS flush syringe 3 mL Intracatheter Q1H PRN   ondansetron (ZOFRAN) 2 mg/mL injection 4 mg Intravenous Q6H PRN   pantoprazole (PROTONIX) delayed release tablet 40 mg Oral Daily before Breakfast   potassium phosphate 30 mmol in NS 500 mL IVPB 30 mmol Intravenous Once   SSIP insulin lispro (HUMALOG) 100 units/mL injection 1-12 Units Subcutaneous 4x/day AC       Physical Exam:  General: No acute distress  HEENT: Oropharynx is clear without lesions.  Cardiac: Regular rate and rhythm, no murmur auscultated.  Respiratory: Clear to auscultation bilaterally without wheeze.  Abdomen: Positive bowel sounds, soft, nontender  Extremities:  Bilateral lower extremity edema still present     Labs:   Results for orders placed or performed during the hospital encounter of 08/25/18 (from the past 24 hour(s))   H & H   Result Value Ref Range    HGB 9.6 (L) 11.8 - 15.8 g/dL    HCT 28.4 (L) 34.6 - 46.2 %   TROPONIN-I   Result Value Ref  Range    TROPONIN I 0.06 (HH) <=0.04 ng/mL   PHOSPHORUS   Result Value Ref Range    PHOSPHORUS 2.6 2.5 - 4.5 mg/dL   CBC   Result Value Ref Range    WBC 5.3 3.5 - 10.3 x10^3/uL    RBC 3.08 (L) 3.80 - 5.24 x10^6/uL    HGB 9.6 (L) 11.8 - 15.8 g/dL    HCT 28.6 (L) 34.6 - 46.2 %    MCV 92.9 82.3 - 96.7 fL    MCH 31.0 27.6 - 33.2 pg    MCHC 33.4 32.6 - 35.4 g/dL    RDW 15.0 12.4 - 15.2 %    PLATELETS 200 140 - 440 x10^3/uL    MPV 9.2 6.6 - 10.2 fL   MAGNESIUM   Result Value Ref Range    MAGNESIUM 1.1 (L) 1.8 - 2.3 mg/dL   COMPREHENSIVE METABOLIC PANEL, NON-FASTING   Result Value Ref Range    SODIUM 133 (L) 135 - 145 mmol/L    POTASSIUM 2.9 (LL) 3.5 - 5.0 mmol/L    CHLORIDE 96 (L) 98 - 111 mmol/L    CO2 TOTAL 32 21 - 35 mmol/L    ANION GAP 5 mmol/L    BUN 10 10 - 25 mg/dL    CREATININE 0.62 <=1.30 mg/dL    BUN/CREA RATIO 16     ESTIMATED GFR >60 Avg: 75 mL/min/1.53m2    ALBUMIN 1.7 (L) 3.2 - 4.6 g/dL    CALCIUM 7.3 (L) 8.8 - 10.3 mg/dL    GLUCOSE 180 (H) 70 - 110 mg/dL    ALKALINE  PHOSPHATASE 467 (H) 20 - 130 U/L    ALT (SGPT) 14 <=52 U/L    AST (SGOT) 17 <=35 U/L    BILIRUBIN TOTAL 1.8 (H) 0.3 - 1.2 mg/dL    PROTEIN TOTAL 3.6 (L) 6.0 - 8.3 g/dL   COMPREHENSIVE METABOLIC PANEL, NON-FASTING   Result Value Ref Range    SODIUM 131 (L) 135 - 145 mmol/L    POTASSIUM 3.3 (L) 3.5 - 5.0 mmol/L    CHLORIDE 97 (L) 98 - 111 mmol/L    CO2 TOTAL 30 21 - 35 mmol/L    ANION GAP 4 mmol/L    BUN 8 (L) 10 - 25 mg/dL    CREATININE 0.60 <=1.30 mg/dL    BUN/CREA RATIO 13     ESTIMATED GFR >60 Avg: 75 mL/min/1.724m    ALBUMIN 1.8 (L) 3.2 - 4.6 g/dL    CALCIUM 7.1 (L) 8.8 - 10.3 mg/dL    GLUCOSE 238 (H) 70 - 110 mg/dL    ALKALINE PHOSPHATASE 407 (H) 20 - 130 U/L    ALT (SGPT) 11 <=52 U/L    AST (SGOT) 11 <=35 U/L    BILIRUBIN TOTAL 1.5 (H) 0.3 - 1.2 mg/dL    PROTEIN TOTAL 3.5 (L) 6.0 - 8.3 g/dL   CBC   Result Value Ref Range    WBC 5.4 3.5 - 10.3 x10^3/uL    RBC 2.88 (L) 3.80 - 5.24 x10^6/uL    HGB 9.1 (L) 11.8 - 15.8 g/dL    HCT 27.0 (L) 34.6 - 46.2 %    MCV 93.8 82.3 - 96.7 fL    MCH 31.5 27.6 - 33.2 pg    MCHC 33.5 32.6 - 35.4 g/dL    RDW 15.0 12.4 - 15.2 %    PLATELETS 194 140 - 440 x10^3/uL    MPV 9.6 6.6 - 10.2 fL   PHOSPHORUS   Result Value  Ref Range    PHOSPHORUS 2.3 (L) 2.5 - 4.5 mg/dL   MAGNESIUM   Result Value Ref Range    MAGNESIUM 1.4 (L) 1.8 - 2.3 mg/dL   POC BLOOD GLUCOSE (RESULTS)   Result Value Ref Range    GLUCOSE, POC 167 (H) 70 - 110 mg/dl   POC BLOOD GLUCOSE (RESULTS)   Result Value Ref Range    GLUCOSE, POC 244 (H) 70 - 110 mg/dl   POC BLOOD GLUCOSE (RESULTS)   Result Value Ref Range    GLUCOSE, POC 310 (H) 70 - 110 mg/dl   POC BLOOD GLUCOSE (RESULTS)   Result Value Ref Range    GLUCOSE, POC 189 (H) 70 - 110 mg/dl         I have reviewed all labs.    Micro:   Hospital Encounter on 08/25/18 (from the past 96 hour(s))   URINE CULTURE,ROUTINE    Collection Time: 08/25/18  7:29 PM   Culture Result Status    URINE CULTURE No Growth 2 Days Final       Radiology:       Images in the chart  reviewed    Right upper quadrant ultrasound  08/25/2018    IMPRESSION:    1. Pancreas is not well evaluated by ultrasound. In the usual location of  the pancreatic head (which is absent due to Whipple procedure) there is  heterogeneous soft tissue and may be associated with the liver but mass  cannot be excluded by ultrasound. Further evaluation with contrast-enhanced  CT would be required to further evaluate this.  2. Mild hepatomegaly. Slight nodularity along liver edge and slight  coarsening of the hepatic echotexture suggests a degree of underlying  fibrosis.  3. Small volume of right upper quadrant ascites.  4. Right pleural effusion.      PT/OT: No    Consults:  Cardiology    Assessment/ Plan:   Active Hospital Problems    Diagnosis   . Primary Problem: Anasarca   . PE (pulmonary thromboembolism) (CMS HCC)   . Hypokalemia   . Open abdominal wall wound   . Pancreatic cancer (CMS [place])   . Type 2 diabetes mellitus (CMS HCC)   . Asthma       PLAN:      Anasarca  - right upper quadrant ultrasound showing some her hepatomegaly and minimal changes consistent with cirrhosis  -she is status post Whipple procedure  -she continues to diurese well with IV Lasix  -continue monitor renal function  - continue Aldactone.      Pancreatic cancer  - status post robotic surgery and chemo and radiation   - she stop the chemotherapy due to side effects  - follow up Oncology as outpatient      Essential Hypertension  -blood pressure is slightly elevated today.  -will continue losartan and metoprolol at reduced dose  -unable to up titrate her metoprolol at this time due to her borderline heart rate  - add hydralazine   -continue vital checks      Recent PE  - Eliquis 2.5 b.i.d.  Currently being held  - will restart in the am       Mild confusion/encephalopathy, metabolic  -likely related to acute illness and possible infectious process  -could also be due to intracranial edema that is improving with diuresis  -  improving  -continue supportive care as well as diuresis as above      urinary tract infection.    -  continue Unasyn for 3 days total treatment       Elevated liver function tests  - continue to follow.    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   - alk phos significantly elevated to 467 today   - common for alk phos to be elevated post whipple       NSTEMI, type II  -with troponin elevation and peak at 0.46  - troponin down to 0.06      DVT prophylaxis with apixaban    DNR    Doristine Locks, MD

## 2018-08-29 NOTE — Care Plan (Signed)
Problem: Fall Injury Risk  Goal: Absence of Fall and Fall-Related Injury  Outcome: Ongoing (see interventions/notes)     Problem: Fluid Volume Excess  Goal: Fluid Balance  Outcome: Ongoing (see interventions/notes)     Problem: Pain (Acute Coronary Syndrome)  Goal: Absence of Cardiac-Related Pain  Outcome: Ongoing (see interventions/notes)     Problem: Hemodynamic Instability (Acute Coronary Syndrome)  Goal: Effective Cardiac Pump Function  Outcome: Ongoing (see interventions/notes)     Problem: Tissue Perfusion (Acute Coronary Syndrome)  Goal: Adequate Tissue Perfusion  Outcome: Ongoing (see interventions/notes)     Problem: Diabetes Comorbidity  Goal: Blood Glucose Level Within Desired Range  Outcome: Ongoing (see interventions/notes)     Problem: Skin Injury Risk Increased  Goal: Skin Health and Integrity  Outcome: Ongoing (see interventions/notes)     Problem: Acute Rehab Services Goal & Intervention Plan  Goal: Physical Therapy Goal  Description  Stand Alone Therapy Goal  Outcome: Ongoing (see interventions/notes)    Patient is alert and verbal. Currently resting in bed. Potassium phosphate infusing. Continues with loose stools.   Bernadette Hoit, RN  08/29/2018, 19:30

## 2018-08-29 NOTE — Care Plan (Signed)
[facility]  Physical Therapy Initial Evaluation    Patient Name: [name]  Date of Birth: [birth year]  Height: Height: 152.4 cm (5')  Weight: Weight: 75.4 kg (166 lb 4.8 oz)  Room/Bed: 6128/A  Payor: MEDICARE / Plan: MEDICARE PART A AND B / Product Type: Medicare /     Assessment:      (P) PT eval completed. Pt w/ c/o dizzines t/o and w/ recurrent diarrhea t/o session. Pt demonstrates deficits in strength, balance and functional mobility and does not demonstrate ability to return home safely at this time. SNF is advised. Pt is hopeful to return home but agrees she is unable to go home in current condition.    Discharge Needs:    Equipment Recommendation: (P) bedside commode        Discharge Disposition: (P) skilled nursing facility    JUSTIFICATION OF DISCHARGE RECOMMENDATION   Based on current diagnosis, functional performance prior to admission, and current functional performance, this patient requires continued PT services in (P) skilled nursing facility in order to achieve significant functional improvements in these deficit areas: (P) gait, locomotion, and balance.        Plan:   Current Intervention: (P) balance training, bed mobility training, home exercise program, gait training, patient/family education, strengthening, transfer training  To provide physical therapy services (P) 1x/day, minimum of 5x/week  for duration of (P) until goals are met.    The risks/benefits of therapy have been discussed with the patient/caregiver and he/she is in agreement with the established plan of care.       Subjective & Objective        08/29/18 1528   Therapist Pager   PT Assigned/ Pager # Anderson Malta [phone]   Rehab Session   Document Type evaluation   Total PT Minutes: 38   Patient Effort good   Symptoms Noted During/After Treatment dizziness   General Information   Patient/Family/Caregiver Comments/Observations pt agreeable to PT   Pertinent History of Current Functional Problem 72  y/o female admitted from outside snf d/t anascara, AMS and r/o infection. PMH: abdominal hernia s/p repair, pancreatic CA s/p sx, anxiety, asthma, COPD, depression, DM, GERD, HLD, PE and peripheral neuropathy   Medical Lines PIV Line;Foley Catheter;Telemetry   Respiratory Status nasal cannula   Existing Precautions/Restrictions fall precautions   Mutuality/Individual Preferences   Individualized Care Needs assist of one w/ chair follow second w/FWW, HFR   Living Environment   Lives With sibling(s)   Living Arrangements house   Home Accessibility ramps present at home   Transportation Available family or friend will provide   Functional Level Prior   Ambulation 1 - assistive equipment   Transferring 1 - assistive equipment   Toileting 0 - independent   Bathing 1 - assistive equipment   Dressing 0 - independent   Eating 0 - independent   Communication 0 - understands/communicates without difficulty   Swallowing 0-->swallows foods/liquids without difficulty   Self-Care   Current Activity Tolerance fair   Equipment Currently Used at Home yes   Equipment Currently Used at [employer], Research officer, political party to Hospital   (rollator)   Pre Treatment Status   Pre Treatment Patient Status Patient supine in bed;Call light within reach   Support Present Pre Treatment  None   Cognitive Assessment/Interventions   Behavior/Mood Observations cooperative   Orientation Status oriented x 4   Attention WNL/WFL   Follows Commands WNL   Vital Signs  Vitals Comment monitored on tele t/o   Pain Assessment   Pain Scale: Numbers, Pretreatment 0/10 - no pain   Pain Scale: Numbers, Post-Treatment 0/10 - no pain   General Extremity Assessment   Comment generalized weakness and deconditioning from baseline   Mobility Assessment/Training   Additional Documentation Bed Mobility Assessment/Treatment (Group)   Bed Mobility Assessment/Treatment   Bed Mobility, Assistive Device bed rails   Supine-Sit Independence minimum assist  (75% patient effort)   Sit to Supine, Independence minimum assist (75% patient effort)   Comment once pt rolls able to maintain  30 seconds, assist w/ BLE for supine <-->sit   Roll Left Independence minimum assist (75% patient effort)   Roll Right Independence minimum assist (75% patient effort)   Transfer Assessment/Treatment   Sit-Stand Independence minimum assist (75% patient effort)   Stand-Sit Independence minimum assist (75% patient effort)  (poor eccentric control)   Sit-Stand-Sit, Assist Device walker, front wheeled   Toilet Transfer Independence minimum assist (75% patient effort)   Toilet Transfer Assist Device grab bar   Gait Assessment/Treatment   Independence  minimum assist (75% patient effort)   Assistive Device  walker, front wheeled   Distance in Feet 10X2   Gait Speed slow, non functional   Impairments  balance impaired;endurance;strength decreased   Balance Skill Training   Sitting Balance: Static good balance   Sitting, Dynamic (Balance) fair + balance   Sit-to-Stand Balance fair + balance   Standing Balance: Static fair balance   Standing Balance: Dynamic fair - balance   Therapeutic Exercise/Activity   Comment seated and standing balance, unable to perform self care after toileting   Post Treatment Status   Post Treatment Patient Status Patient sitting in bedside chair or w/c;Call light within reach   Support Present Post Treatment  None   Communication Post Treatement Care Management   Communication Post Treatment Comment return to snf advised   Plan of Care Review   Plan Of Care Reviewed With patient   Basic Mobility Am-PAC/6Clicks Score   Exercise Level Performed 6- Walked 10 steps or more   Physical Therapy Clinical Impression   Assessment PT eval completed. Pt w/ c/o dizzines t/o and w/ recurrent diarrhea t/o session. Pt demonstrates deficits in strength, balance and functional mobility and does not demonstrate ability to return home safely at this time. SNF is advised. Pt is hopeful to  return home but agrees she is unable to go home in current condition.   Impairments Found (describe specific impairments) gait, locomotion, and balance   Rehab Potential good, to achieve stated therapy goals   Therapy Frequency 1x/day;minimum of 5x/week   Predicted Duration of Therapy Intervention (days/wks) until goals are met   Anticipated Equipment Needs at Discharge (PT) bedside commode   Anticipated Discharge Disposition skilled nursing facility   Care Plan Goals   PT Rehab Goals Bed Mobility Goal;Gait Training Goal;Strength Goal;Transfer Training Goal   Bed Mobility Goal   Bed Mobility Goal, Date Established 08/29/18   Bed Mobility Goal, Time to Achieve 2 wks   Bed Mobility Goal, Activity Type all bed mobility activities   Bed Mobility Goal, Independence Level independent   Gait Training  Goal, Distance to Achieve   Gait Training  Goal, Date Established 08/29/18   Gait Training  Goal, Time to Achieve 2 wks   Gait Training  Goal, Independence Level modified independence   Gait Training  Goal, Assist Device least restricted assistive device   Gait Training  Goal, Distance to  Achieve 300   Strength Goal   Strength Goal, Date Established 08/29/18   Strength Goal, Time to Achieve 2 wks   Strength Goal, Functional Goal 5 STS in 1 min   Transfer Training Goal   Transfer Training Goal, Date Established 08/29/18   Transfer Training Goal, Time to Achieve 2 wks   Transfer Training Goal, Activity Type all transfers   Transfer Training Goal, Independence Level independent   Planned Therapy Interventions, PT Eval   Planned Therapy Interventions (PT) balance training;bed mobility training;home exercise program;gait training;patient/family education;strengthening;transfer training   [place] Patient Lifting and Movement Equipment    Lifting equipment used (see row detail for guideline): None       Therapist:   Vincent Peyer, PT 08/29/2018 15:40  Phone: [phone]

## 2018-08-30 LAB — CBC
HCT: 27.1 % — ABNORMAL LOW (ref 34.6–46.2)
HGB: 9 g/dL — ABNORMAL LOW (ref 11.8–15.8)
MCH: 31.4 pg (ref 27.6–33.2)
MCHC: 33.3 g/dL (ref 32.6–35.4)
MCV: 94.1 fL (ref 82.3–96.7)
MPV: 9.6 fL (ref 6.6–10.2)
PLATELETS: 190 x10ˆ3/uL (ref 140–440)
RBC: 2.88 x10ˆ6/uL — ABNORMAL LOW (ref 3.80–5.24)
RDW: 14.9 % (ref 12.4–15.2)
WBC: 4.2 x10ˆ3/uL (ref 3.5–10.3)

## 2018-08-30 LAB — COMPREHENSIVE METABOLIC PANEL, NON-FASTING
ALBUMIN: 1.7 g/dL — ABNORMAL LOW (ref 3.2–4.6)
ALKALINE PHOSPHATASE: 366 U/L — ABNORMAL HIGH (ref 20–130)
ALT (SGPT): 10 U/L (ref <=52)
ANION GAP: 5 mmol/L
AST (SGOT): 13 U/L (ref <=35)
BILIRUBIN TOTAL: 1.4 mg/dL — ABNORMAL HIGH (ref 0.3–1.2)
BUN/CREA RATIO: 10
BUN: 6 mg/dL — ABNORMAL LOW (ref 10–25)
CALCIUM: 7.4 mg/dL — ABNORMAL LOW (ref 8.8–10.3)
CHLORIDE: 99 mmol/L (ref 98–111)
CO2 TOTAL: 29 mmol/L (ref 21–35)
CREATININE: 0.59 mg/dL (ref <=1.30)
ESTIMATED GFR: >60 mL/min/{1.73_m2}
GLUCOSE: 95 mg/dL (ref 70–110)
POTASSIUM: 3.3 mmol/L — ABNORMAL LOW (ref 3.5–5.0)
PROTEIN TOTAL: 3.5 g/dL — ABNORMAL LOW (ref 6.0–8.3)
SODIUM: 133 mmol/L — ABNORMAL LOW (ref 135–145)

## 2018-08-30 LAB — PHOSPHORUS: PHOSPHORUS: 3.8 mg/dL (ref 2.5–4.5)

## 2018-08-30 LAB — POC BLOOD GLUCOSE (RESULTS)
GLUCOSE, POC: 97 mg/dL (ref 70–110)
GLUCOSE, POC: 170 mg/dL — ABNORMAL HIGH (ref 70–110)
GLUCOSE, POC: 258 mg/dL — ABNORMAL HIGH (ref 70–110)
GLUCOSE, POC: 217 mg/dL — ABNORMAL HIGH (ref 70–110)

## 2018-08-30 LAB — MAGNESIUM: MAGNESIUM: 1.2 mg/dL — ABNORMAL LOW (ref 1.8–2.3)

## 2018-08-30 MED ORDER — NYSTATIN 100,000 UNIT/GRAM TOPICAL POWDER
Freq: Three times a day (TID) | CUTANEOUS | Status: DC
Start: 2018-08-30 — End: 2018-09-04
  Administered 2018-09-03: 0 via TOPICAL
  Filled 2018-08-30: qty 15

## 2018-08-30 MED ADMIN — apixaban 2.5 mg tablet: @ 09:00:00

## 2018-08-30 MED ADMIN — hydrALAZINE 10 mg tablet: ORAL | @ 07:00:00

## 2018-08-30 MED ADMIN — sodium chloride 0.9 % (flush) injection syringe: ORAL | @ 08:00:00

## 2018-08-30 MED ADMIN — sodium hypochlorite 0.125 % solution: SUBCUTANEOUS | @ 07:00:00 | NDC 00436067216

## 2018-08-30 NOTE — Care Plan (Signed)
Pt was alert and oriented, and remains free from falls. No complaints voiced. Foley is maintained. This patient currently meets requirements for low to mid level nursing care. The patient will continue to be monitored and re-evaluated for any changes in condition.    -Evonnie Pat, RN

## 2018-08-30 NOTE — Progress Notes (Signed)
[facility]  Hospitalist Progress Note    [name]       72 y.o.       Date of service: 08/30/2018  Date of Admission:  08/25/2018    Hospital Day:  LOS: 5 days   CC: Anasarca    Subjective:      Patient seen and chart reviewed.  Informed by nursing staff the patient was complaining of some lower superficial abdominal pain.  She confirms that is superficial.  Has had a little bit of dizziness when getting up out of bed.  Otherwise no other complaint    Objective:   Filed Vitals:    08/30/18 0652 08/30/18 0800 08/30/18 1200 08/30/18 1600   BP: (!) 129/44 (!) 142/98 (!) 127/40 (!) 133/58   Pulse:  88 58 76   Resp:  '18 20 18   ' Temp:  37.4 C (99.3 F) 36.9 C (98.4 F) 37.1 C (98.8 F)   SpO2:  100% 100% 100%     O2 delivery: Nasal Cannula     I have reviewed the vitals.      Input/Output    Intake/Output Summary (Last 24 hours) at 08/30/2018 1838  Last data filed at 08/30/2018 1600  Gross per 24 hour   Intake 65 ml   Output 600 ml   Net -535 ml           Current Facility-Administered Medications:  acetaminophen (TYLENOL) tablet 650 mg Oral Q6H PRN   albuterol (PROVENTIL) 2.5 mg / 3 mL (0.083%) neb solution 2.5 mg Nebulization Q4H PRN   ampicillin-sulbactam (UNASYN) 1.5 g in NS 50 mL IVPB 1.5 g Intravenous Q6H   [Held by provider] apixaban (ELIQUIS) tablet 2.5 mg Oral 2x/day   aspirin (ECOTRIN) enteric coated tablet 81 mg 81 mg Oral Daily   atorvastatin (LIPITOR) tablet 10 mg Oral QPM   citalopram (CELEXA) tablet 20 mg Oral Daily   dextrose 50% (0.5 g/mL) injection - syringe 25 g Intravenous Q15 Min PRN   diphenhydrAMINE (BENADRYL) capsule 25 mg Oral HS PRN   furosemide (LASIX) 10 mg/mL injection 40 mg Intravenous 2x/day   hydrALAZINE (APRESOLINE) tablet 10 mg Oral Q8HRS   HYDROcodone-acetaminophen (NORCO) 5-325 mg per tablet 1 Tab Oral Q4H PRN   hydrocortisone-pramoxine (PROCTOFOAM HC) 1-1% rectal foam 1 Applicator Rectal G0F   losartan (COZAAR) tablet 100 mg Oral Daily   metoprolol succinate  (TOPROL-XL) 24 hr extended release tablet 25 mg Oral Daily   NS flush syringe 3 mL Intracatheter Q8HRS   NS flush syringe 3 mL Intracatheter Q1H PRN   nystatin (NYSTOP) 100,000 units/g topical powder  Apply Topically 3x/day   ondansetron (ZOFRAN) 2 mg/mL injection 4 mg Intravenous Q6H PRN   pantoprazole (PROTONIX) delayed release tablet 40 mg Oral Daily before Breakfast   SSIP insulin lispro (HUMALOG) 100 units/mL injection 1-12 Units Subcutaneous 4x/day AC       Physical Exam:  General: No acute distress  HEENT: Oropharynx is clear without lesions.  Cardiac: Regular rate and rhythm, no murmur auscultated.  Respiratory: Clear to auscultation bilaterally without wheeze.  Abdomen: Positive bowel sounds, soft, nontender  Extremities:  Bilateral lower extremity edema still present     Labs:   Results for orders placed or performed during the hospital encounter of 08/25/18 (from the past 24 hour(s))   COMPREHENSIVE METABOLIC PANEL, NON-FASTING   Result Value Ref Range    SODIUM 133 (L) 135 - 145 mmol/L    POTASSIUM 3.3 (L) 3.5 - 5.0  mmol/L    CHLORIDE 99 98 - 111 mmol/L    CO2 TOTAL 29 21 - 35 mmol/L    ANION GAP 5 mmol/L    BUN 6 (L) 10 - 25 mg/dL    CREATININE 0.59 <=1.30 mg/dL    BUN/CREA RATIO 10     ESTIMATED GFR >60 Avg: 75 mL/min/1.20m2    ALBUMIN 1.7 (L) 3.2 - 4.6 g/dL    CALCIUM 7.4 (L) 8.8 - 10.3 mg/dL    GLUCOSE 95 70 - 110 mg/dL    ALKALINE PHOSPHATASE 366 (H) 20 - 130 U/L    ALT (SGPT) 10 <=52 U/L    AST (SGOT) 13 <=35 U/L    BILIRUBIN TOTAL 1.4 (H) 0.3 - 1.2 mg/dL    PROTEIN TOTAL 3.5 (L) 6.0 - 8.3 g/dL   CBC   Result Value Ref Range    WBC 4.2 3.5 - 10.3 x10^3/uL    RBC 2.88 (L) 3.80 - 5.24 x10^6/uL    HGB 9.0 (L) 11.8 - 15.8 g/dL    HCT 27.1 (L) 34.6 - 46.2 %    MCV 94.1 82.3 - 96.7 fL    MCH 31.4 27.6 - 33.2 pg    MCHC 33.3 32.6 - 35.4 g/dL    RDW 14.9 12.4 - 15.2 %    PLATELETS 190 140 - 440 x10^3/uL    MPV 9.6 6.6 - 10.2 fL   PHOSPHORUS   Result Value Ref Range    PHOSPHORUS 3.8 2.5 - 4.5 mg/dL      MAGNESIUM   Result Value Ref Range    MAGNESIUM 1.2 (L) 1.8 - 2.3 mg/dL   POC BLOOD GLUCOSE (RESULTS)   Result Value Ref Range    GLUCOSE, POC 238 (H) 70 - 110 mg/dl   POC BLOOD GLUCOSE (RESULTS)   Result Value Ref Range    GLUCOSE, POC 97 70 - 110 mg/dl   POC BLOOD GLUCOSE (RESULTS)   Result Value Ref Range    GLUCOSE, POC 170 (H) 70 - 110 mg/dl   POC BLOOD GLUCOSE (RESULTS)   Result Value Ref Range    GLUCOSE, POC 258 (H) 70 - 110 mg/dl         I have reviewed all labs.    Micro:   No results found for any visits on 08/25/18 (from the past 96 hour(s)).    Radiology:       Images in the chart reviewed    Right upper quadrant ultrasound  08/25/2018    IMPRESSION:    1. Pancreas is not well evaluated by ultrasound. In the usual location of  the pancreatic head (which is absent due to Whipple procedure) there is  heterogeneous soft tissue and may be associated with the liver but mass  cannot be excluded by ultrasound. Further evaluation with contrast-enhanced  CT would be required to further evaluate this.  2. Mild hepatomegaly. Slight nodularity along liver edge and slight  coarsening of the hepatic echotexture suggests a degree of underlying  fibrosis.  3. Small volume of right upper quadrant ascites.  4. Right pleural effusion.      PT/OT: No    Consults:  Cardiology    Assessment/ Plan:   Active Hospital Problems    Diagnosis   . Primary Problem: Anasarca   . PE (pulmonary thromboembolism) (CMS HCC)   . Hypokalemia   . Open abdominal wall wound   . Pancreatic cancer ([place]   . Type 2 diabetes mellitus (CMS HCC)   .  Asthma       PLAN:      Anasarca  - right upper quadrant ultrasound showing some her hepatomegaly and minimal changes consistent with cirrhosis  -she is status post Whipple procedure  -she continues to diurese well with IV Lasix  - kidneys are tolerating the diuretics  -currently a 8.5 L net negative  -continue monitor renal function  - continue Aldactone.  - she is complaining of some dizziness  when she gets up out of bed.  -encourage her to take her time while standing up  - we can check orthostatics  -blood pressure has been stable  -PT consulted      Pancreatic cancer  - status post robotic surgery and chemo and radiation   - she stopped the chemotherapy due to side effects  - follow up Oncology as outpatient      Essential Hypertension  -blood pressure is slightly elevated today.  -will continue losartan   - continue metoprolol at reduced dose  -unable to up titrate her metoprolol at this time due to her borderline heart rate  - continue hydralazine       Recent PE  - Eliquis 2.5 b.i.d.  Currently being held  - restart Eliquis      Mild confusion/encephalopathy, metabolic  -likely related to acute illness and possible infectious process  -could also be due to intracranial edema that has improved with diuresis  -continue supportive care as well as diuresis as above      urinary tract infection.    - completed Unasyn for 3 days total treatment        Elevated liver function tests  - continue to follow.    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   - alk phos significantly elevated to 467 today   - common for alk phos to be elevated post whipple       NSTEMI, type II  -with troponin elevation and peak at 0.46  - troponin down to 0.06      DVT prophylaxis with apixaban    DNR    Doristine Locks, MD

## 2018-08-30 NOTE — Care Plan (Signed)
Problem: Fall Injury Risk  Goal: Absence of Fall and Fall-Related Injury  Outcome: Ongoing (see interventions/notes)     Problem: Fluid Volume Excess  Goal: Fluid Balance  Outcome: Ongoing (see interventions/notes)     Problem: Pain (Acute Coronary Syndrome)  Goal: Absence of Cardiac-Related Pain  Outcome: Ongoing (see interventions/notes)     Problem: Hemodynamic Instability (Acute Coronary Syndrome)  Goal: Effective Cardiac Pump Function  Outcome: Ongoing (see interventions/notes)     Problem: Tissue Perfusion (Acute Coronary Syndrome)  Goal: Adequate Tissue Perfusion  Outcome: Ongoing (see interventions/notes)     Problem: Diabetes Comorbidity  Goal: Blood Glucose Level Within Desired Range  Outcome: Ongoing (see interventions/notes)     Problem: Skin Injury Risk Increased  Goal: Skin Health and Integrity  Outcome: Ongoing (see interventions/notes)     Problem: Acute Rehab Services Goal & Intervention Plan  Goal: Physical Therapy Goal  Description  Stand Alone Therapy Goal  Outcome: Ongoing (see interventions/notes)    Patient is alert and verbal. Currently resting in bed watching tv. Participated with PT today. PRN Zofran given for nausea. Hospitalitis gave new order for nystatin power for stomach pain.      Bernadette Hoit, RN  08/30/2018, 19:03

## 2018-08-30 NOTE — Care Plan (Signed)
[place]  Physical Therapy Progress Note    Patient Name: [name]  Date of Birth: 05/14/1946  Height:  152.4 cm (5')  Weight:  75.4 kg (166 lb 4.8 oz)  Room/Bed: 6128/A  Payor: MEDICARE / Plan: MEDICARE PART A AND B / Product Type: Medicare /     Assessment:     (P) pt participated well and demonstrates improved abilities from eval however still does not demonstrate ability to safely return home. SNF advised and pt agreeable to swing bed for short duration.    Discharge Needs:   Equipment Recommendation: bedside commode        Discharge Disposition: skilled nursing facility    JUSTIFICATION OF DISCHARGE RECOMMENDATION   Based on current diagnosis, functional performance prior to admission, and current functional performance, this patient requires continued PT services in skilled nursing facility in order to achieve significant functional improvements in these deficit areas: gait, locomotion, and balance.      Plan:   Continue to follow patient according to established plan of care.  The risks/benefits of therapy have been discussed with the patient/caregiver and he/she is in agreement with the established plan of care.     Subjective & Objective:        08/30/18 1534   Therapist Pager   PT Assigned/ Pager # Anderson Malta [phone]   Rehab Session   Document Type therapy progress note (daily note)   Total PT Minutes: 26   Patient Effort good   Symptoms Noted During/After Treatment fatigue   General Information   Patient/Family/Caregiver Comments/Observations pt agreeable to PT   Medical Lines Central Line;Telemetry   Respiratory Status nasal cannula   Existing Precautions/Restrictions fall precautions   Mutuality/Individual Preferences   Individualized Care Needs assist of one w/FWW   Pre Treatment Status   Pre Treatment Patient Status Patient sitting in bedside chair or w/c;Call light within reach;Nurse approved session   Support Present Pre Treatment  None   Cognitive  Assessment/Interventions   Behavior/Mood Observations behavior appropriate to situation, WNL/WFL   Orientation Status oriented x 4   Attention WNL/WFL   Follows Commands WNL   Vital Signs   Vitals Comment monitored on tele t/o   Bed Mobility Assessment/Treatment   Supine-Sit Independence not tested   Transfer Assessment/Treatment   Sit-Stand Independence supervision required;verbal cues required   Stand-Sit Independence supervision required;verbal cues required   Sit-Stand-Sit, Assist Device walker, front wheeled   Toilet Transfer Independence not tested   Gait Assessment/Treatment   Independence  contact guard assist;verbal cues required   Assistive Device  walker, front wheeled   Distance in Feet 40X2   Gait Speed slow, steady and functional   Impairments  balance impaired   Motor Skills/Interventions   Additional Documentation Therapeutic Exercise (Group)   Therapeutic Exercise/Activity   Upper Extremity bilateral;bicep curl;tricep extension   Upper Extremity Range of Motion bilateral;shoulder flexion/extension   Lower Extremity ankle pumps;LAQ (long arc quad), bilateral;marching while seated   Lower Extremity Range of Motion bilateral;hip abduction/adduction   Sets/Reps  1/10   Post Treatment Status   Post Treatment Patient Status Patient sitting in bedside chair or w/c;Call light within reach   Support Present Post Treatment  None   Plan of Care Review   Plan Of Care Reviewed With patient   Basic Mobility Am-PAC/6Clicks Score   Exercise Level Performed 7- Walked 25 feet or more   Physical Therapy Clinical Impression   Assessment pt participated well and demonstrates improved  abilities from eval however still does not demonstrate ability to safely return home. SNF advised and pt agreeable to swing bed for short duration.       Therapist:   Vincent Peyer, PT   Phone: [phone]

## 2018-08-31 LAB — COMPREHENSIVE METABOLIC PANEL, NON-FASTING
ALBUMIN: 1.8 g/dL — ABNORMAL LOW (ref 3.2–4.6)
ALKALINE PHOSPHATASE: 320 U/L — ABNORMAL HIGH (ref 20–130)
ALT (SGPT): 7 U/L (ref <=52)
ANION GAP: 3 mmol/L
AST (SGOT): 12 U/L (ref <=35)
BILIRUBIN TOTAL: 1.3 mg/dL — ABNORMAL HIGH (ref 0.3–1.2)
BUN/CREA RATIO: 9
BUN: 6 mg/dL — ABNORMAL LOW (ref 10–25)
CALCIUM: 7.4 mg/dL — ABNORMAL LOW (ref 8.8–10.3)
CHLORIDE: 98 mmol/L (ref 98–111)
CO2 TOTAL: 31 mmol/L (ref 21–35)
CREATININE: 0.68 mg/dL (ref <=1.30)
ESTIMATED GFR: >60 mL/min/1.73mˆ2
GLUCOSE: 100 mg/dL (ref 70–110)
POTASSIUM: 3.5 mmol/L (ref 3.5–5.0)
PROTEIN TOTAL: 3.4 g/dL — ABNORMAL LOW (ref 6.0–8.3)
SODIUM: 132 mmol/L — ABNORMAL LOW (ref 135–145)

## 2018-08-31 LAB — PHOSPHORUS: PHOSPHORUS: 3.2 mg/dL (ref 2.5–4.5)

## 2018-08-31 LAB — POC BLOOD GLUCOSE (RESULTS)
GLUCOSE, POC: 105 mg/dL (ref 70–110)
GLUCOSE, POC: 128 mg/dL — ABNORMAL HIGH (ref 70–110)
GLUCOSE, POC: 287 mg/dL — ABNORMAL HIGH (ref 70–110)
GLUCOSE, POC: 246 mg/dL — ABNORMAL HIGH (ref 70–110)

## 2018-08-31 LAB — MAGNESIUM: MAGNESIUM: 1.2 mg/dL — ABNORMAL LOW (ref 1.8–2.3)

## 2018-08-31 MED ORDER — MAGNESIUM SULFATE 4 MEQ/ML (50 %) INJECTION SOLUTION
4.00 g | INTRAMUSCULAR | Status: AC
Start: 2018-08-31 — End: 2018-08-31
  Administered 2018-08-31: 4 g via INTRAVENOUS
  Administered 2018-08-31: 0 g via INTRAVENOUS
  Filled 2018-08-31: qty 8

## 2018-08-31 MED ADMIN — benzocaine 7.5 % mucosal gel: INTRAVENOUS | @ 15:00:00 | NDC 1031003313

## 2018-08-31 MED ADMIN — lactated Ringers intravenous solution: ORAL | @ 06:00:00 | NDC 00264775000

## 2018-08-31 MED ADMIN — lactated Ringers intravenous solution: INTRAVENOUS | @ 17:00:00 | NDC 00264775000

## 2018-08-31 MED ADMIN — lactated Ringers intravenous solution: ORAL | @ 09:00:00 | NDC 00264775000

## 2018-08-31 MED ADMIN — lanolin-oxyquin-pet, hydrophil topical ointment: ORAL | @ 15:00:00 | NDC 09999989257

## 2018-08-31 NOTE — Care Plan (Signed)
Patient was alert and oriented x4. Patient remained free from falls during shift. Patient's Vitals remained stable. Patient remained in normal sinus with frequent trigeminal episodes. Answered questions and patient verbalized understanding. Will continue to monitor. Jenne Campus, RN  08/31/2018, 05:13      Problem: Fall Injury Risk  Goal: Absence of Fall and Fall-Related Injury  Outcome: Ongoing (see interventions/notes)     Problem: Fluid Volume Excess  Goal: Fluid Balance  Outcome: Ongoing (see interventions/notes)     Problem: Pain (Acute Coronary Syndrome)  Goal: Absence of Cardiac-Related Pain  Outcome: Ongoing (see interventions/notes)     Problem: Hemodynamic Instability (Acute Coronary Syndrome)  Goal: Effective Cardiac Pump Function  Outcome: Ongoing (see interventions/notes)     Problem: Tissue Perfusion (Acute Coronary Syndrome)  Goal: Adequate Tissue Perfusion  Outcome: Ongoing (see interventions/notes)     Problem: Diabetes Comorbidity  Goal: Blood Glucose Level Within Desired Range  Outcome: Ongoing (see interventions/notes)     Problem: Skin Injury Risk Increased  Goal: Skin Health and Integrity  Outcome: Ongoing (see interventions/notes)     Problem: Acute Rehab Services Goal & Intervention Plan  Goal: Physical Therapy Goal  Description  Stand Alone Therapy Goal  Outcome: Ongoing (see interventions/notes)

## 2018-08-31 NOTE — Care Plan (Signed)
Problem: Fall Injury Risk  Goal: Absence of Fall and Fall-Related Injury  Intervention: Identify and Manage Contributors to Fall Injury Risk  Flowsheets  Taken 08/29/2018 2051 by Evonnie Pat, RN  Self-Care Promotion: independence encouraged;BADL personal objects within reach  Taken 08/31/2018 0845 by Abby Potash, RN  Medication Review/Management: medications reviewed     Problem: Fall Injury Risk  Goal: Absence of Fall and Fall-Related Injury  Intervention: [place] (Taken 08/31/2018 1800)  Safety Promotion/Fall Prevention: safety round/check completed  Environmental Safety Modification: assistive device/personal items within reach;clutter free environment maintained     Problem: Fluid Volume Excess  Goal: Fluid Balance  Intervention: Monitor and Manage Hypervolemia  Flowsheets  Taken 08/31/2018 0845 by Abby Potash, RN  Skin Protection: adhesive use limited  Taken 08/30/2018 2000 by Jenne Campus, RN  Fluid/Electrolyte Management: fluids provided     Problem: Pain (Acute Coronary Syndrome)  Goal: Absence of Cardiac-Related Pain  Intervention: Manage Cardiac Pain Symptoms  Flowsheets (Taken 08/25/2018 2323 by Marice Potter, RN)  Chest Pain Intervention: cardiac monitoring continued     Problem: Hemodynamic Instability (Acute Coronary Syndrome)  Goal: Effective Cardiac Pump Function  Intervention: Optimize Cardiac Function and Blood Flow  Flowsheets  Taken 08/30/2018 2000 by Jenne Campus, RN  Fluid/Electrolyte Management: fluids provided  Taken 08/31/2018 0845 by Abby Potash, RN  Stabilization Measures: legs elevated     Problem: Tissue Perfusion (Acute Coronary Syndrome)  Goal: Adequate Tissue Perfusion  Intervention: Optimize Cardiac Tissue Perfusion  Flowsheets  Taken 08/25/2018 2323 by Marice Potter, RN  Airway/Ventilation Management: calming measures promoted  Taken 08/30/2018 2300 by Eugene Garnet, PCA  Activity Management: activity adjusted per tolerance  Taken 08/30/2018  2000 by Jenne Campus, RN  Environmental Support: rest periods encouraged     Problem: Diabetes Comorbidity  Goal: Blood Glucose Level Within Desired Range  Intervention: Maintain Glycemic Control  Flowsheets (Taken 08/30/2018 2000 by Jenne Campus, RN)  Glycemic Management: blood glucose monitoring     Problem: Skin Injury Risk Increased  Goal: Skin Health and Integrity  Intervention: Optimize Skin Protection  Flowsheets  Taken 08/30/2018 2000 by Jenne Campus, RN  Pressure Reduction Techniques: frequent weight shift encouraged  Pressure Reduction Devices: heels elevated off bed  Taken 08/31/2018 0845 by Abby Potash, RN  Skin Protection: adhesive use limited  Taken 08/30/2018 2300 by [place], Whitney, PCA  Head of Bed [facility]): HOB elevated

## 2018-08-31 NOTE — Care Plan (Signed)
Problem: Fall Injury Risk  Goal: Absence of Fall and Fall-Related Injury  Outcome: Ongoing (see interventions/notes)  Intervention: Promote Copywriter, advertising (Taken 08/31/2018 2200)  Safety Promotion/Fall Prevention: activity supervised;fall prevention program maintained;nonskid shoes/slippers when out of bed;safety round/check completed  Environmental Safety Modification: assistive device/personal items within reach;clutter free environment maintained;room organization consistent     Problem: Fluid Volume Excess  Goal: Fluid Balance  Outcome: Ongoing (see interventions/notes)  Intervention: Monitor and Manage Hypervolemia  Flowsheets (Taken 08/31/2018 2106)  Skin Protection: adhesive use limited;incontinence pads utilized  Fluid/Electrolyte Management: fluids provided     Problem: Pain (Acute Coronary Syndrome)  Goal: Absence of Cardiac-Related Pain  Outcome: Ongoing (see interventions/notes)  Intervention: Manage Cardiac Pain Symptoms  Flowsheets (Taken 08/31/2018 2327)  Chest Pain Intervention: cardiac monitoring continued     Problem: Hemodynamic Instability (Acute Coronary Syndrome)  Goal: Effective Cardiac Pump Function  Outcome: Ongoing (see interventions/notes)  Intervention: Optimize Cardiac Function and Blood Flow  Flowsheets  Taken 08/31/2018 2106 by Marice Potter, RN  Fluid/Electrolyte Management: fluids provided  Taken 08/31/2018 0845 by Abby Potash, RN  Stabilization Measures: legs elevated     Problem: Tissue Perfusion (Acute Coronary Syndrome)  Goal: Adequate Tissue Perfusion  Outcome: Ongoing (see interventions/notes)  Intervention: Optimize Cardiac Tissue Perfusion  Flowsheets  Taken 08/25/2018 2323  Airway/Ventilation Management: calming measures promoted  Taken 08/31/2018 2327  Activity Management: activity adjusted per tolerance;activity encouraged;ROM, active encouraged  Environmental Support: calm environment promoted;rest periods encouraged     Problem: Diabetes  Comorbidity  Goal: Blood Glucose Level Within Desired Range  Outcome: Ongoing (see interventions/notes)  Intervention: Maintain Glycemic Control  Flowsheets (Taken 08/31/2018 2106)  Glycemic Management: blood glucose monitoring;supplemental insulin given     Problem: Skin Injury Risk Increased  Goal: Skin Health and Integrity  Outcome: Ongoing (see interventions/notes)  Intervention: Optimize Skin Protection  Flowsheets  Taken 08/30/2018 2000 by Jenne Campus, RN  Pressure Reduction Techniques: frequent weight shift encouraged  Pressure Reduction Devices: heels elevated off bed  Taken 08/31/2018 2106 by Marice Potter, RN  Skin Protection: adhesive use limited;incontinence pads utilized  Head of Bed (HOB): HOB at 45 degrees     Problem: Acute Rehab Services Goal & Intervention Plan  Goal: Physical Therapy Goal  Description  Stand Alone Therapy Goal  Outcome: Ongoing (see interventions/notes)

## 2018-08-31 NOTE — Care Management Notes (Signed)
Call placed to [facility] @ [facility] to discuss case. VM left. ACB. Stann Ore, CASE MANAGER

## 2018-08-31 NOTE — Progress Notes (Signed)
[facility]  Hospitalist Progress Note    [name]       72 y.o.       Date of service: 08/31/2018  Date of Admission:  08/25/2018    Hospital Day:  LOS: 6 days   CC: Anasarca    Subjective:      Patient doing better, fluid coming off of her.      Objective:   Filed Vitals:    08/31/18 0300 08/31/18 0500 08/31/18 0845 08/31/18 1135   BP: (!) 147/45 (!) 156/51 (!) 141/49 (!) 124/53   Pulse: 77 79 78 88   Resp: _0 Temp: 37.2 C (99 F) 36.4 C (97.5 F) 37.4 C (99.3 F) 36.7 C (98.1 F)   SpO2: 100% 100% 100% 100%     O2 delivery: Nasal Cannula     I have reviewed the vitals.      Input/Output    Intake/Output Summary (Last 24 hours) at 08/31/2018 1657  Last data filed at 08/31/2018 1134  Gross per 24 hour   Intake --   Output 1275 ml   Net -1275 ml           Current Facility-Administered Medications:  acetaminophen (TYLENOL) tablet 650 mg Oral Q6H PRN   albuterol (PROVENTIL) 2.5 mg / 3 mL (0.083%) neb solution 2.5 mg Nebulization Q4H PRN   apixaban (ELIQUIS) tablet 2.5 mg Oral 2x/day   aspirin (ECOTRIN) enteric coated tablet 81 mg 81 mg Oral Daily   atorvastatin (LIPITOR) tablet 10 mg Oral QPM   citalopram (CELEXA) tablet 20 mg Oral Daily   dextrose 50% (0.5 g/mL) injection - syringe 25 g Intravenous Q15 Min PRN   diphenhydrAMINE (BENADRYL) capsule 25 mg Oral HS PRN   furosemide (LASIX) 10 mg/mL injection 40 mg Intravenous 2x/day   hydrALAZINE (APRESOLINE) tablet 10 mg Oral Q8HRS   HYDROcodone-acetaminophen (NORCO) 5-325 mg per tablet 1 Tab Oral Q4H PRN   hydrocortisone-pramoxine (PROCTOFOAM HC) 1-1% rectal foam 1 Applicator Rectal O9B   losartan (COZAAR) tablet 100 mg Oral Daily   magnesium sulfate 4 G in D5W 100 mL IVPB 4 g Intravenous Now   metoprolol succinate (TOPROL-XL) 24 hr extended release tablet 25 mg Oral Daily   NS flush syringe 3 mL Intracatheter Q8HRS   NS flush syringe 3 mL Intracatheter Q1H PRN   nystatin (NYSTOP) 100,000 units/g topical powder  Apply Topically 3x/day    ondansetron (ZOFRAN) 2 mg/mL injection 4 mg Intravenous Q6H PRN   pantoprazole (PROTONIX) delayed release tablet 40 mg Oral Daily before Breakfast   SSIP insulin lispro (HUMALOG) 100 units/mL injection 1-12 Units Subcutaneous 4x/day AC       Physical Exam:  General: No acute distress  HEENT: Oropharynx is clear without lesions.  Cardiac: Regular rate and rhythm, no murmur auscultated.  Respiratory: Clear to auscultation bilaterally without wheeze.  Abdomen: Positive bowel sounds, soft, nontender  Extremities:  Trace Bilateral lower extremity edema     Labs:   Results for orders placed or performed during the hospital encounter of 08/25/18 (from the past 24 hour(s))   COMPREHENSIVE METABOLIC PANEL, NON-FASTING   Result Value Ref Range    SODIUM 132 (L) 135 - 145 mmol/L    POTASSIUM 3.5 3.5 - 5.0 mmol/L    CHLORIDE 98 98 - 111 mmol/L    CO2 TOTAL 31 21 - 35 mmol/L    ANION GAP 3 mmol/L    BUN 6 (L) 10 - 25 mg/dL  CREATININE 0.68 <=1.30 mg/dL    BUN/CREA RATIO 9     ESTIMATED GFR >60 Avg: 75 mL/min/1.62m2    ALBUMIN 1.8 (L) 3.2 - 4.6 g/dL    CALCIUM 7.4 (L) 8.8 - 10.3 mg/dL    GLUCOSE 100 70 - 110 mg/dL    ALKALINE PHOSPHATASE 320 (H) 20 - 130 U/L    ALT (SGPT) 7 <=52 U/L    AST (SGOT) 12 <=35 U/L    BILIRUBIN TOTAL 1.3 (H) 0.3 - 1.2 mg/dL    PROTEIN TOTAL 3.4 (L) 6.0 - 8.3 g/dL   PHOSPHORUS   Result Value Ref Range    PHOSPHORUS 3.2 2.5 - 4.5 mg/dL   MAGNESIUM   Result Value Ref Range    MAGNESIUM 1.2 (L) 1.8 - 2.3 mg/dL   POC BLOOD GLUCOSE (RESULTS)   Result Value Ref Range    GLUCOSE, POC 217 (H) 70 - 110 mg/dl   POC BLOOD GLUCOSE (RESULTS)   Result Value Ref Range    GLUCOSE, POC 105 70 - 110 mg/dl   POC BLOOD GLUCOSE (RESULTS)   Result Value Ref Range    GLUCOSE, POC 128 (H) 70 - 110 mg/dl         I have reviewed all labs.    Micro:   No results found for any visits on 08/25/18 (from the past 96 hour(s)).    Radiology:       Images in the chart reviewed    Right upper quadrant  ultrasound  08/25/2018    IMPRESSION:    1. Pancreas is not well evaluated by ultrasound. In the usual location of  the pancreatic head (which is absent due to Whipple procedure) there is  heterogeneous soft tissue and may be associated with the liver but mass  cannot be excluded by ultrasound. Further evaluation with contrast-enhanced  CT would be required to further evaluate this.  2. Mild hepatomegaly. Slight nodularity along liver edge and slight  coarsening of the hepatic echotexture suggests a degree of underlying  fibrosis.  3. Small volume of right upper quadrant ascites.  4. Right pleural effusion.      PT/OT: No    Consults:  Cardiology    Assessment/ Plan:   Active Hospital Problems    Diagnosis   . Primary Problem: Anasarca   . PE (pulmonary thromboembolism) (CMS HCC)   . Hypokalemia   . Open abdominal wall wound   . [place]ic [place]   . Type 2 diabetes mellitus (CMS HCC)   . Asthma       PLAN:      Anasarca  - right upper quadrant ultrasound showing some her hepatomegaly and minimal changes consistent with cirrhosis  -she is status post Whipple procedure  -she continues to diurese well with IV Lasix  - kidneys are tolerating the diuretics  -continue monitor renal function  - continue Aldactone.  - she is complaining of some dizziness when she gets up out of bed.  -encourage her to take her time while standing up  - we can check orthostatics  -blood pressure has been stable  -PT consulted      Pancreatic cancer  - status post robotic surgery and chemo and radiation   - she stopped the chemotherapy due to side effects  - follow up Oncology as outpatient      Essential Hypertension  -blood pressure is slightly elevated today.  -will continue losartan   - continue metoprolol at reduced dose  -unable to  up titrate her metoprolol at this time due to her borderline heart rate  - continue hydralazine       Recent PE  - Eliquis 2.5 b.i.d.  Currently being held  - restart Eliquis      Mild  confusion/encephalopathy, metabolic  -likely related to acute illness and possible infectious process  -could also be due to intracranial edema that has improved with diuresis  -continue supportive care as well as diuresis as above      urinary tract infection.    - completed Unasyn for 3 days total treatment        Elevated liver function tests  - continue to follow.    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   - alk phos significantly elevated to 467 today   - common for alk phos to be elevated post whipple       NSTEMI, type II  -with troponin elevation and peak at 0.46  - troponin down to 0.06      DVT prophylaxis with apixaban    DNR    Melton Krebs, MD  08/31/2018 16:57

## 2018-09-01 LAB — CBC WITH DIFF
BASOPHIL #: 0.1 x10ˆ3/uL (ref 0.00–0.20)
BASOPHIL %: 2 %
BASOPHIL %: 2 %
EOSINOPHIL #: 0.3 x10ˆ3/uL (ref 0.00–0.50)
EOSINOPHIL %: 6 %
HCT: 25.2 % — ABNORMAL LOW (ref 34.6–46.2)
HGB: 8.4 g/dL — ABNORMAL LOW (ref 11.8–15.8)
LYMPHOCYTE #: 0.7 x10ˆ3/uL — ABNORMAL LOW (ref 0.90–3.40)
LYMPHOCYTE %: 14 %
MCH: 31.4 pg (ref 27.6–33.2)
MCHC: 33.5 g/dL (ref 32.6–35.4)
MCV: 93.6 fL (ref 82.3–96.7)
MONOCYTE #: 0.6 x10ˆ3/uL (ref 0.20–0.90)
MONOCYTE %: 13 %
MPV: 9.4 fL (ref 6.6–10.2)
NEUTROPHIL #: 3.1 x10ˆ3/uL (ref 1.50–6.40)
NEUTROPHIL %: 65 %
PLATELETS: 192 x10ˆ3/uL (ref 140–440)
RBC: 2.69 x10?6/uL — ABNORMAL LOW (ref 3.80–5.24)
RBC: 2.69 x10ˆ6/uL — ABNORMAL LOW (ref 3.80–5.24)
RDW: 14.7 % (ref 12.4–15.2)
WBC: 4.8 x10ˆ3/uL (ref 3.5–10.3)

## 2018-09-01 LAB — POC BLOOD GLUCOSE (RESULTS)
GLUCOSE, POC: 82 mg/dL (ref 70–110)
GLUCOSE, POC: 172 mg/dL — ABNORMAL HIGH (ref 70–110)
GLUCOSE, POC: 226 mg/dL — ABNORMAL HIGH (ref 70–110)
GLUCOSE, POC: 264 mg/dL — ABNORMAL HIGH (ref 70–110)

## 2018-09-01 LAB — HEPATIC FUNCTION PANEL
ALBUMIN: 1.8 g/dL — ABNORMAL LOW (ref 3.2–4.6)
ALKALINE PHOSPHATASE: 314 U/L — ABNORMAL HIGH (ref 20–130)
ALT (SGPT): 7 U/L (ref <=52)
AST (SGOT): 15 U/L (ref <=35)
BILIRUBIN DIRECT: 0.6 mg/dL — ABNORMAL HIGH (ref <=0.3)
BILIRUBIN TOTAL: 1.3 mg/dL — ABNORMAL HIGH (ref 0.3–1.2)
PROTEIN TOTAL: 3.6 g/dL — ABNORMAL LOW (ref 6.0–8.3)

## 2018-09-01 LAB — BASIC METABOLIC PANEL, FASTING
ANION GAP: 4 mmol/L
BUN/CREA RATIO: 11
BUN: 7 mg/dL — ABNORMAL LOW (ref 10–25)
CALCIUM: 7.3 mg/dL — ABNORMAL LOW (ref 8.8–10.3)
CHLORIDE: 96 mmol/L — ABNORMAL LOW (ref 98–111)
CO2 TOTAL: 30 mmol/L (ref 21–35)
CREATININE: 0.62 mg/dL (ref <=1.30)
ESTIMATED GFR: >60 mL/min/1.73m?2
ESTIMATED GFR: >60 mL/min/1.73mˆ2
GLUCOSE: 72 mg/dL (ref 70–110)
POTASSIUM: 3.6 mmol/L (ref 3.5–5.0)
SODIUM: 130 mmol/L — ABNORMAL LOW (ref 135–145)

## 2018-09-01 LAB — PHOSPHORUS: PHOSPHORUS: 2.9 mg/dL (ref 2.5–4.5)

## 2018-09-01 LAB — MAGNESIUM: MAGNESIUM: 1.8 mg/dL (ref 1.8–2.3)

## 2018-09-01 MED ORDER — SPIRONOLACTONE 50 MG TABLET
50.00 mg | ORAL_TABLET | Freq: Every morning | ORAL | Status: DC
Start: 2018-09-01 — End: 2018-09-04
  Administered 2018-09-01 – 2018-09-04 (×5): 50 mg via ORAL
  Filled 2018-09-01 (×5): qty 1

## 2018-09-01 MED ORDER — FUROSEMIDE 40 MG TABLET
40.0000 mg | ORAL_TABLET | Freq: Every day | ORAL | Status: DC
Start: 2018-09-02 — End: 2018-09-04
  Administered 2018-09-02 – 2018-09-04 (×3): 40 mg via ORAL
  Filled 2018-09-01 (×4): qty 1

## 2018-09-01 MED ADMIN — pantoprazole 40 mg tablet,delayed release: ORAL | @ 07:00:00

## 2018-09-01 MED ADMIN — HYDROcodone 5 mg-acetaminophen 325 mg tablet: ORAL | @ 10:00:00

## 2018-09-01 NOTE — Nurses Notes (Signed)
Pt is lying in bed resting with eyes closed. VSS. Foley catheter is secured and draining clear yellow urine to gravity. No significant events throughout shift and pt appears to be stable at this time. Will continue to monitor.    Marice Potter, RN  09/01/2018, 06:00

## 2018-09-01 NOTE — Progress Notes (Addendum)
[facility]  Hospitalist Progress Note    [name]       72 y.o.       Date of service: 09/01/2018  Date of Admission:  08/25/2018    Hospital Day:  LOS: 7 days   CC: Anasarca    Subjective:      Patient doing better, fluid coming off of her, patient's legs now feel close to normal.      Objective:   Filed Vitals:    08/31/18 2106 09/01/18 0300 09/01/18 0632 09/01/18 0734   BP: (!) 137/54 (!) 136/58 (!) 150/48 (!) 147/64   Pulse: 80 75  71   Resp: 18 18  18    Temp: 37.3 C (99.1 F) 36.9 C (98.4 F)  36.8 C (98.2 F)   SpO2: 100% 100%  100%     O2 delivery: Nasal Cannula     I have reviewed the vitals.      Input/Output    Intake/Output Summary (Last 24 hours) at 09/01/2018 1432  Last data filed at 09/01/2018 0900  Gross per 24 hour   Intake 1028 ml   Output 1150 ml   Net -122 ml           Current Facility-Administered Medications:  acetaminophen (TYLENOL) tablet 650 mg Oral Q6H PRN   albuterol (PROVENTIL) 2.5 mg / 3 mL (0.083%) neb solution 2.5 mg Nebulization Q4H PRN   apixaban (ELIQUIS) tablet 2.5 mg Oral 2x/day   aspirin (ECOTRIN) enteric coated tablet 81 mg 81 mg Oral Daily   atorvastatin (LIPITOR) tablet 10 mg Oral QPM   citalopram (CELEXA) tablet 20 mg Oral Daily   dextrose 50% (0.5 g/mL) injection - syringe 25 g Intravenous Q15 Min PRN   diphenhydrAMINE (BENADRYL) capsule 25 mg Oral HS PRN   furosemide (LASIX) 10 mg/mL injection 40 mg Intravenous 2x/day   hydrALAZINE (APRESOLINE) tablet 10 mg Oral Q8HRS   HYDROcodone-acetaminophen (NORCO) 5-325 mg per tablet 1 Tab Oral Q4H PRN   hydrocortisone-pramoxine (PROCTOFOAM HC) 1-1% rectal foam 1 Applicator Rectal Z6X   losartan (COZAAR) tablet 100 mg Oral Daily   metoprolol succinate (TOPROL-XL) 24 hr extended release tablet 25 mg Oral Daily   NS flush syringe 3 mL Intracatheter Q8HRS   NS flush syringe 3 mL Intracatheter Q1H PRN   nystatin (NYSTOP) 100,000 units/g topical powder  Apply Topically 3x/day   ondansetron (ZOFRAN) 2 mg/mL injection 4 mg  Intravenous Q6H PRN   pantoprazole (PROTONIX) delayed release tablet 40 mg Oral Daily before Breakfast   spironolactone (ALDACTONE) tablet 50 mg Oral Daily with Breakfast   SSIP insulin lispro (HUMALOG) 100 units/mL injection 1-12 Units Subcutaneous 4x/day AC       Physical Exam:  General: No acute distress  HEENT: Oropharynx is clear without lesions.  Cardiac: Regular rate and rhythm, no murmur auscultated.  Respiratory: Clear to auscultation bilaterally without wheeze.  Abdomen: Positive bowel sounds, soft, nontender  Extremities:  Trace Bilateral lower extremity edema     Labs:   Results for orders placed or performed during the hospital encounter of 08/25/18 (from the past 24 hour(s))   CBC/DIFF    Narrative    The following orders were created for panel order CBC/DIFF.  Procedure                               Abnormality         Status                     ---------                               -----------         ------  CBC WITH DIFF[[id number]]                Abnormal            Final result                 Please view results for these tests on the individual orders.   BASIC METABOLIC PANEL, FASTING   Result Value Ref Range    SODIUM 130 (L) 135 - 145 mmol/L    POTASSIUM 3.6 3.5 - 5.0 mmol/L    CHLORIDE 96 (L) 98 - 111 mmol/L    CO2 TOTAL 30 21 - 35 mmol/L    ANION GAP 4 mmol/L    CALCIUM 7.3 (L) 8.8 - 10.3 mg/dL    GLUCOSE 72 70 - 110 mg/dL    BUN 7 (L) 10 - 25 mg/dL    CREATININE 0.62 <=1.30 mg/dL    BUN/CREA RATIO 11     ESTIMATED GFR >60 Avg: 75 mL/min/1.61m^2   MAGNESIUM   Result Value Ref Range    MAGNESIUM 1.8 1.8 - 2.3 mg/dL   PHOSPHORUS   Result Value Ref Range    PHOSPHORUS 2.9 2.5 - 4.5 mg/dL   HEPATIC FUNCTION PANEL   Result Value Ref Range    ALBUMIN 1.8 (L) 3.2 - 4.6 g/dL    ALKALINE PHOSPHATASE 314 (H) 20 - 130 U/L    ALT (SGPT) 7 <=52 U/L    AST (SGOT) 15 <=35 U/L    BILIRUBIN TOTAL 1.3 (H) 0.3 - 1.2 mg/dL    BILIRUBIN DIRECT 0.6 (H) <=0.3 mg/dL    PROTEIN TOTAL 3.6 (L) 6.0  - 8.3 g/dL   CBC WITH DIFF   Result Value Ref Range    WBC 4.8 3.5 - 10.3 x10^3/uL    RBC 2.69 (L) 3.80 - 5.24 x10^6/uL    HGB 8.4 (L) 11.8 - 15.8 g/dL    HCT 25.2 (L) 34.6 - 46.2 %    MCV 93.6 82.3 - 96.7 fL    MCH 31.4 27.6 - 33.2 pg    MCHC 33.5 32.6 - 35.4 g/dL    RDW 14.7 12.4 - 15.2 %    PLATELETS 192 140 - 440 x10^3/uL    MPV 9.4 6.6 - 10.2 fL    NEUTROPHIL % 65 %    LYMPHOCYTE % 14 %    MONOCYTE % 13 %    EOSINOPHIL % 6 %    BASOPHIL % 2 %    NEUTROPHIL # 3.10 1.50 - 6.40 x10^3/uL    LYMPHOCYTE # 0.70 (L) 0.90 - 3.40 x10^3/uL    MONOCYTE # 0.60 0.20 - 0.90 x10^3/uL    EOSINOPHIL # 0.30 0.00 - 0.50 x10^3/uL    BASOPHIL # 0.10 0.00 - 0.20 x10^3/uL   POC BLOOD GLUCOSE (RESULTS)   Result Value Ref Range    GLUCOSE, POC 287 (H) 70 - 110 mg/dl   POC BLOOD GLUCOSE (RESULTS)   Result Value Ref Range    GLUCOSE, POC 246 (H) 70 - 110 mg/dl   POC BLOOD GLUCOSE (RESULTS)   Result Value Ref Range    GLUCOSE, POC 82 70 - 110 mg/dl   POC BLOOD GLUCOSE (RESULTS)   Result Value Ref Range    GLUCOSE, POC 172 (H) 70 - 110 mg/dl         I have reviewed all labs.    Micro:   No results found for any visits on 08/25/18 (from the past 96 hour(s)).  Radiology:       Images in the chart reviewed    Right upper quadrant ultrasound  08/25/2018    IMPRESSION:    1. Pancreas is not well evaluated by ultrasound. In the usual location of  the pancreatic head (which is absent due to Whipple procedure) there is  heterogeneous soft tissue and may be associated with the liver but mass  cannot be excluded by ultrasound. Further evaluation with contrast-enhanced  CT would be required to further evaluate this.  2. Mild hepatomegaly. Slight nodularity along liver edge and slight  coarsening of the hepatic echotexture suggests a degree of underlying  fibrosis.  3. Small volume of right upper quadrant ascites.  4. Right pleural effusion.      PT/OT: No    Consults:  Cardiology    Assessment/ Plan:   Active Hospital Problems    Diagnosis   .  Primary Problem: Anasarca   . PE (pulmonary thromboembolism) (CMS HCC)   . Hypokalemia   . Open abdominal wall wound   . Pancreatic cancer (CMS [place])   . Type 2 diabetes mellitus (CMS HCC)   . Asthma       PLAN:      Anasarca, I believe secondary to previously undiagnosed liver cirrhosis, although diastolic CHF could be a component  - right upper quadrant ultrasound showing some her hepatomegaly and minimal changes consistent with cirrhosis  - LFTs stable  -she is status post Whipple procedure  -diuresed well with IV Lasix, will transition to po furosemide and spironolactone  -check hepatitis panel  -will need referral to GI upon discharge    Pancreatic cancer, diagnosed 2018  - status post robotic surgery and chemo and radiation   - she stopped the chemotherapy in July 2019 due to side effects and has been monitored with what sound like serial MRCPs  - follow up Oncology as outpatient    Deconditioning  -PT recommends SNF    Essential Hypertension  -blood pressure is slightly elevated today.  -will continue losartan   - continue metoprolol at reduced dose  -unable to up titrate her metoprolol at this time due to her borderline heart rate  - continue hydralazine     Recent PE  - Eliquis 2.5 b.i.d.  Currently being held  - restart Eliquis    Mild confusion/encephalopathy, metabolic  -likely related to acute illness and possible infectious process  -could also be due to intracranial edema that has improved with diuresis  -continue supportive care as well as diuresis as above    urinary tract infection.    - completed Unasyn for 3 days total treatment      NSTEMI, type II  -with troponin elevation and peak at 0.46  - troponin down to 0.06      DVT prophylaxis with apixaban    DNR    Melton Krebs, MD  09/01/2018 14:32

## 2018-09-01 NOTE — Care Plan (Signed)
Problem: Fall Injury Risk  Goal: Absence of Fall and Fall-Related Injury  Intervention: Identify and Manage Contributors to Fall Injury Risk  Flowsheets (Taken 08/31/2018 2106 by Marice Potter, RN)  Self-Care Promotion: independence encouraged  Medication Review/Management: medications reviewed     Problem: Fall Injury Risk  Goal: Absence of Fall and Fall-Related Injury  Intervention: Promote Injury-Free Environment  Flowsheets (Taken 09/01/2018 1800)  Safety Promotion/Fall Prevention: safety round/check completed  Environmental Safety Modification: assistive device/personal items within reach;clutter free environment maintained     Problem: Fluid Volume Excess  Goal: Fluid Balance  Intervention: Monitor and Manage Hypervolemia  Flowsheets  Taken 09/01/2018 0734 by Abby Potash, RN  Skin Protection: adhesive use limited  Taken 08/31/2018 2106 by Marice Potter, RN  Fluid/Electrolyte Management: fluids provided   Patient has been alert and oriented throughout shift. Has remained without any dyspneic episodes or trouble breathing during shift. Patient has right chest port that is accessed, patent, and provides adequate blood return. Site is clean, dry and intact with no signs of edema or erythema. Patient ambulated 50 ft and sat in chair for 4.5 hours today. Patient has remained free of falls this shift and skin integrity remains intact. Patient is continuing to work toward discharge goals and outcomes, will continue to monitor.     Filed Vitals:    08/31/18 2106 09/01/18 0300 09/01/18 0632 09/01/18 0734   BP: (!) 137/54 (!) 136/58 (!) 150/48 (!) 147/64   Pulse: 80 75  71   Resp: 18 18  18    Temp: 37.3 C (99.1 F) 36.9 C (98.4 F)  36.8 C (98.2 F)   SpO2: 100% 100%  100%     Abby Potash, RN  09/01/2018, 19:12

## 2018-09-02 LAB — PHOSPHORUS: PHOSPHORUS: 3.1 mg/dL (ref 2.5–4.5)

## 2018-09-02 LAB — MAGNESIUM: MAGNESIUM: 1.6 mg/dL — ABNORMAL LOW (ref 1.8–2.3)

## 2018-09-02 LAB — HEPATITIS C ANTIBODY SCREEN WITH REFLEX TO HCV PCR: HCV ANTIBODY QUALITATIVE: NEGATIVE

## 2018-09-02 LAB — CBC WITH DIFF
BASOPHIL #: 0.1 x10ˆ3/uL (ref 0.00–0.20)
BASOPHIL %: 2 %
EOSINOPHIL #: 0.3 x10ˆ3/uL (ref 0.00–0.50)
EOSINOPHIL %: 8 %
HCT: 22.8 % — ABNORMAL LOW (ref 34.6–46.2)
HGB: 7.7 g/dL — ABNORMAL LOW (ref 11.8–15.8)
LYMPHOCYTE #: 0.9 x10ˆ3/uL (ref 0.90–3.40)
LYMPHOCYTE %: 21 %
MCH: 31.5 pg (ref 27.6–33.2)
MCHC: 33.8 g/dL (ref 32.6–35.4)
MCV: 93.2 fL (ref 82.3–96.7)
MONOCYTE #: 0.5 10*3/uL (ref 0.20–0.90)
MONOCYTE %: 12 %
MPV: 9.2 fL (ref 6.6–10.2)
NEUTROPHIL #: 2.6 10*3/uL (ref 1.50–6.40)
NEUTROPHIL %: 58 %
PLATELETS: 167 x10ˆ3/uL (ref 140–440)
RBC: 2.44 x10ˆ6/uL — ABNORMAL LOW (ref 3.80–5.24)
RDW: 14.6 % (ref 12.4–15.2)
WBC: 4.5 x10ˆ3/uL (ref 3.5–10.3)

## 2018-09-02 LAB — POC BLOOD GLUCOSE (RESULTS)
GLUCOSE, POC: 87 mg/dL (ref 70–110)
GLUCOSE, POC: 148 mg/dL — ABNORMAL HIGH (ref 70–110)
GLUCOSE, POC: 148 mg/dl — ABNORMAL HIGH (ref 70–110)
GLUCOSE, POC: 263 mg/dL — ABNORMAL HIGH (ref 70–110)
GLUCOSE, POC: 263 mg/dl — ABNORMAL HIGH (ref 70–110)
GLUCOSE, POC: 220 mg/dL — ABNORMAL HIGH (ref 70–110)

## 2018-09-02 LAB — BASIC METABOLIC PANEL, FASTING
ANION GAP: 4 mmol/L
BUN/CREA RATIO: 13
BUN: 8 mg/dL — ABNORMAL LOW (ref 10–25)
CALCIUM: 7.2 mg/dL — ABNORMAL LOW (ref 8.8–10.3)
CHLORIDE: 94 mmol/L — ABNORMAL LOW (ref 98–111)
CO2 TOTAL: 29 mmol/L (ref 21–35)
CREATININE: 0.64 mg/dL (ref <=1.30)
ESTIMATED GFR: >60 mL/min/1.73mˆ2
GLUCOSE: 77 mg/dL (ref 70–110)
POTASSIUM: 3.6 mmol/L (ref 3.5–5.0)
SODIUM: 127 mmol/L — ABNORMAL LOW (ref 135–145)

## 2018-09-02 LAB — HEPATITIS B SURFACE ANTIBODY: HBV SURFACE ANTIBODY QUANTITATIVE: 0 m[IU]/mL (ref <8)

## 2018-09-02 LAB — HEPATIC FUNCTION PANEL
ALBUMIN: 1.7 g/dL — ABNORMAL LOW (ref 3.2–4.6)
ALKALINE PHOSPHATASE: 272 U/L — ABNORMAL HIGH (ref 20–130)
ALT (SGPT): 7 U/L (ref <=52)
AST (SGOT): 13 U/L (ref <=35)
BILIRUBIN DIRECT: 0.5 mg/dL — ABNORMAL HIGH (ref <=0.3)
BILIRUBIN TOTAL: 1.1 mg/dL (ref 0.3–1.2)
PROTEIN TOTAL: 3.3 g/dL — ABNORMAL LOW (ref 6.0–8.3)

## 2018-09-02 LAB — HEPATITIS B SURFACE ANTIGEN: HBV SURFACE ANTIGEN QUALITATIVE: NEGATIVE

## 2018-09-02 MED ORDER — MAGNESIUM SULFATE 500 MG/ML (50 %) INJECTION SOLUTION
2.0000 g | Freq: Once | INTRAVENOUS | Status: AC
Start: 2018-09-02 — End: 2018-09-02
  Administered 2018-09-02: 0 g via INTRAVENOUS
  Administered 2018-09-02: 2 g via INTRAVENOUS
  Filled 2018-09-02: qty 4

## 2018-09-02 MED ORDER — SODIUM CHLORIDE 1 GRAM TABLET
1.00 g | ORAL_TABLET | Freq: Three times a day (TID) | ORAL | Status: AC
Start: 2018-09-02 — End: 2018-09-03
  Administered 2018-09-02 – 2018-09-03 (×3): 1 g via ORAL
  Filled 2018-09-02 (×3): qty 1

## 2018-09-02 MED ADMIN — lactated Ringers intravenous solution: ORAL | @ 10:00:00

## 2018-09-02 MED ADMIN — acetaminophen 325 mg tablet: ORAL | @ 07:00:00

## 2018-09-02 MED ADMIN — sodium chloride 0.9 % intravenous solution: RECTAL | NDC 00338004904

## 2018-09-02 NOTE — Care Plan (Signed)
Problem: Fall Injury Risk  Goal: Absence of Fall and Fall-Related Injury  Intervention: Identify and Manage Contributors to Fall Injury Risk  Flowsheets (Taken 09/01/2018 2035 by Marice Potter, RN)  Self-Care Promotion: independence encouraged  Medication Review/Management: medications reviewed     Problem: Fall Injury Risk  Goal: Absence of Fall and Fall-Related Injury  Intervention: Promote Injury-Free Environment  Flowsheets (Taken 09/02/2018 1800)  Safety Promotion/Fall Prevention: activity supervised;fall prevention program maintained;nonskid shoes/slippers when out of bed;safety round/check completed  Environmental Safety Modification: clutter free environment maintained;assistive device/personal items within reach     Problem: Fluid Volume Excess  Goal: Fluid Balance  Intervention: Monitor and Manage Hypervolemia  Flowsheets  Taken 09/02/2018 5790 by Abby Potash, RN  Skin Protection: adhesive use limited  Taken 09/01/2018 2035 by Marice Potter, RN  Fluid/Electrolyte Management: fluids provided     Problem: Pain (Acute Coronary Syndrome)  Goal: Absence of Cardiac-Related Pain  Intervention: Manage Cardiac Pain Symptoms  Flowsheets (Taken 08/31/2018 2327 by Marice Potter, RN)  Chest Pain Intervention: cardiac monitoring continued     Problem: Hemodynamic Instability (Acute Coronary Syndrome)  Goal: Effective Cardiac Pump Function  Intervention: Optimize Cardiac Function and Blood Flow  Flowsheets  Taken 09/01/2018 2035 by Marice Potter, RN  Fluid/Electrolyte Management: fluids provided  Taken 09/02/2018 3833 by Abby Potash, RN  Stabilization Measures: legs elevated     Problem: Tissue Perfusion (Acute Coronary Syndrome)  Goal: Adequate Tissue Perfusion  Intervention: Optimize Cardiac Tissue Perfusion  Flowsheets  Taken 08/25/2018 2323 by Marice Potter, RN  Airway/Ventilation Management: calming measures promoted  Taken 09/02/2018 0500 by Alexis Frock, PAT CARE [facility]  Activity Management: activity adjusted per  tolerance  Taken 09/01/2018 2035 by Marice Potter, RN  Environmental Support: calm environment promoted;comfort object encouraged     Problem: Skin Injury Risk Increased  Goal: Skin Health and Integrity  Intervention: Optimize Skin Protection  Flowsheets  Taken 08/30/2018 2000 by Jenne Campus, RN  Pressure Reduction Techniques: frequent weight shift encouraged  Pressure Reduction Devices: heels elevated off bed  Taken 09/02/2018 0822 by Abby Potash, RN  Skin Protection: adhesive use limited  Taken 09/02/2018 0500 by Alexis Frock, PAT CARE TECH  Head of Bed [facility] elevated     Patient has been alert and oriented throughout shift. Has remained without any dyspneic episodes or trouble breathing during shift. Patient has right chest port that is accessed, patent, and provides adequate blood return. Site is clean, dry and intact with no signs of edema or erythema. Medicated with PRN Zofran x1 for vomiting, relieved patient of symptoms. Medicated with PRN Norco x1 for a headache, relieved pain. Patient has remained free of falls this shift and skin integrity remains intact. Patient is continuing to work toward discharge goals and outcomes, will continue to monitor.  Filed Vitals:    09/01/18 0734 09/01/18 2035 09/02/18 0822 09/02/18 1617   BP: (!) 147/64 (!) 130/96 (!) 154/43 (!) 151/72   Pulse: 71 71 71 69   Resp: 18 18 18 18    Temp: 36.8 C (98.2 F) 36.9 C (98.4 F) 37.1 C (98.8 F) 37.1 C (98.8 F)   SpO2: 100% 100% 100% 100%     Abby Potash, RN  09/02/2018, 21:06

## 2018-09-02 NOTE — Care Plan (Signed)
Problem: Fall Injury Risk  Goal: Absence of Fall and Fall-Related Injury  Outcome: Ongoing (see interventions/notes)  Intervention: Identify and Manage Contributors to Fall Injury Risk  Flowsheets (Taken 09/01/2018 2035)  Self-Care Promotion: independence encouraged  Medication Review/Management: medications reviewed  Intervention: Promote [place] (Taken 09/02/2018 0045)  Safety Promotion/Fall Prevention: activity supervised;fall prevention program maintained;nonskid shoes/slippers when out of bed;safety round/check completed  Environmental Safety Modification: assistive device/personal items within reach;clutter free environment maintained;room organization consistent     Problem: Fluid Volume Excess  Goal: Fluid Balance  Outcome: Ongoing (see interventions/notes)  Intervention: Monitor and Manage Hypervolemia  Flowsheets (Taken 09/01/2018 2035)  Skin Protection: adhesive use limited;incontinence pads utilized;electrode sites changed;preventative decubiti skin protection foam dressing applied/intact;skin sealant/moisture barrier applied  Fluid/Electrolyte Management: fluids provided     Problem: Pain (Acute Coronary Syndrome)  Goal: Absence of Cardiac-Related Pain  Outcome: Ongoing (see interventions/notes)  Intervention: Manage Cardiac Pain Symptoms  Flowsheets (Taken 08/31/2018 2327)  Chest Pain Intervention: cardiac monitoring continued     Problem: Hemodynamic Instability (Acute Coronary Syndrome)  Goal: Effective Cardiac Pump Function  Outcome: Ongoing (see interventions/notes)  Intervention: Optimize Cardiac Function and Blood Flow  Flowsheets (Taken 09/01/2018 2035)  Fluid/Electrolyte Management: fluids provided  Stabilization Measures: legs elevated     Problem: Tissue Perfusion (Acute Coronary Syndrome)  Goal: Adequate Tissue Perfusion  Outcome: Ongoing (see interventions/notes)  Intervention: Optimize Cardiac Tissue Perfusion  Flowsheets  Taken 08/25/2018 2323  Airway/Ventilation  Management: calming measures promoted  Taken 09/02/2018 0251  Activity Management: activity adjusted per tolerance;activity encouraged;ROM, active encouraged  Taken 09/01/2018 2035  Environmental Support: calm environment promoted;comfort object encouraged     Problem: Diabetes Comorbidity  Goal: Blood Glucose Level Within Desired Range  Outcome: Ongoing (see interventions/notes)  Intervention: Maintain Glycemic Control  Flowsheets (Taken 09/01/2018 2035)  Glycemic Management: blood glucose monitoring;supplemental insulin given     Problem: Skin Injury Risk Increased  Goal: Skin Health and Integrity  Outcome: Ongoing (see interventions/notes)  Intervention: Optimize Skin Protection  Flowsheets  Taken 08/30/2018 2000 by Jenne Campus, RN  Pressure Reduction Techniques: frequent weight shift encouraged  Pressure Reduction Devices: heels elevated off bed  Taken 09/01/2018 2035 by Marice Potter, RN  Skin Protection: adhesive use limited;incontinence pads utilized;electrode sites changed;preventative decubiti skin protection foam dressing applied/intact;skin sealant/moisture barrier applied  Taken 09/01/2018 2100 by Tresa Endo, PCA  Head of Bed [facility] elevated     Problem: Acute Rehab Services Goal & Intervention Plan  Goal: Physical Therapy Goal  Description  Stand Alone Therapy Goal  Outcome: Ongoing (see interventions/notes)

## 2018-09-02 NOTE — Progress Notes (Signed)
[facility]  Hospitalist Progress Note    [name]       72 y.o.       Date of service: 09/02/2018  Date of Admission:  08/25/2018    Hospital Day:  LOS: 8 days   CC: Anasarca    Subjective:    Doing okay, appetite good.      Objective:   Filed Vitals:    09/01/18 0632 09/01/18 0734 09/01/18 2035 09/02/18 0822   BP: (!) 150/48 (!) 147/64 (!) 130/96 (!) 154/43   Pulse:  71 71 71   Resp:  18 18 18    Temp:  36.8 C (98.2 F) 36.9 C (98.4 F) 37.1 C (98.8 F)   SpO2:  100% 100% 100%     O2 delivery: Nasal Cannula     I have reviewed the vitals.      Input/Output    Intake/Output Summary (Last 24 hours) at 09/02/2018 1350  Last data filed at 09/01/2018 2100  Gross per 24 hour   Intake 420 ml   Output 1000 ml   Net -580 ml           Current Facility-Administered Medications:  acetaminophen (TYLENOL) tablet 650 mg Oral Q6H PRN   albuterol (PROVENTIL) 2.5 mg / 3 mL (0.083%) neb solution 2.5 mg Nebulization Q4H PRN   apixaban (ELIQUIS) tablet 2.5 mg Oral 2x/day   aspirin (ECOTRIN) enteric coated tablet 81 mg 81 mg Oral Daily   atorvastatin (LIPITOR) tablet 10 mg Oral QPM   citalopram (CELEXA) tablet 20 mg Oral Daily   dextrose 50% (0.5 g/mL) injection - syringe 25 g Intravenous Q15 Min PRN   diphenhydrAMINE (BENADRYL) capsule 25 mg Oral HS PRN   furosemide (LASIX) tablet 40 mg Oral Daily   hydrALAZINE (APRESOLINE) tablet 10 mg Oral Q8HRS   HYDROcodone-acetaminophen (NORCO) 5-325 mg per tablet 1 Tab Oral Q4H PRN   hydrocortisone-pramoxine (PROCTOFOAM HC) 1-1% rectal foam 1 Applicator Rectal M0H   losartan (COZAAR) tablet 100 mg Oral Daily   magnesium sulfate 2 G in D5W 100 mL IVPB 2 g Intravenous Once   metoprolol succinate (TOPROL-XL) 24 hr extended release tablet 25 mg Oral Daily   NS flush syringe 3 mL Intracatheter Q8HRS   NS flush syringe 3 mL Intracatheter Q1H PRN   nystatin (NYSTOP) 100,000 units/g topical powder  Apply Topically 3x/day   ondansetron (ZOFRAN) 2 mg/mL injection 4 mg Intravenous Q6H PRN     pantoprazole (PROTONIX) delayed release tablet 40 mg Oral Daily before Breakfast   sodium chloride tablet 1 g Oral 3x/day-Meals   spironolactone (ALDACTONE) tablet 50 mg Oral Daily with Breakfast   SSIP insulin lispro (HUMALOG) 100 units/mL injection 1-12 Units Subcutaneous 4x/day AC       Physical Exam:  General: No acute distress  HEENT: Oropharynx is clear without lesions.  Cardiac: Regular rate and rhythm, no murmur auscultated.  Respiratory: Clear to auscultation bilaterally without wheeze.  Abdomen: Positive bowel sounds, soft, nontender  Extremities:  Trace Bilateral lower extremity edema     Labs:   Results for orders placed or performed during the hospital encounter of 08/25/18 (from the past 24 hour(s))   CBC/DIFF    Narrative    The following orders were created for panel order CBC/DIFF.  Procedure                               Abnormality  Status                     ---------                               -----------         ------                     CBC WITH [id number]]                Abnormal            Final result                 Please view results for these tests on the individual orders.   BASIC METABOLIC PANEL, FASTING   Result Value Ref Range    SODIUM 127 (L) 135 - 145 mmol/L    POTASSIUM 3.6 3.5 - 5.0 mmol/L    CHLORIDE 94 (L) 98 - 111 mmol/L    CO2 TOTAL 29 21 - 35 mmol/L    ANION GAP 4 mmol/L    CALCIUM 7.2 (L) 8.8 - 10.3 mg/dL    GLUCOSE 77 70 - 110 mg/dL    BUN 8 (L) 10 - 25 mg/dL    CREATININE 0.64 <=1.30 mg/dL    BUN/CREA RATIO 13     ESTIMATED GFR >60 Avg: 75 mL/min/1.100m^2   MAGNESIUM   Result Value Ref Range    MAGNESIUM 1.6 (L) 1.8 - 2.3 mg/dL   PHOSPHORUS   Result Value Ref Range    PHOSPHORUS 3.1 2.5 - 4.5 mg/dL   HEPATIC FUNCTION PANEL   Result Value Ref Range    ALBUMIN 1.7 (L) 3.2 - 4.6 g/dL    ALKALINE PHOSPHATASE 272 (H) 20 - 130 U/L    ALT (SGPT) 7 <=52 U/L    AST (SGOT) 13 <=35 U/L    BILIRUBIN TOTAL 1.1 0.3 - 1.2 mg/dL    BILIRUBIN DIRECT 0.5 (H) <=0.3 mg/dL     PROTEIN TOTAL 3.3 (L) 6.0 - 8.3 g/dL   CBC WITH DIFF   Result Value Ref Range    WBC 4.5 3.5 - 10.3 x10^3/uL    RBC 2.44 (L) 3.80 - 5.24 x10^6/uL    HGB 7.7 (L) 11.8 - 15.8 g/dL    HCT 22.8 (L) 34.6 - 46.2 %    MCV 93.2 82.3 - 96.7 fL    MCH 31.5 27.6 - 33.2 pg    MCHC 33.8 32.6 - 35.4 g/dL    RDW 14.6 12.4 - 15.2 %    PLATELETS 167 140 - 440 x10^3/uL    MPV 9.2 6.6 - 10.2 fL    NEUTROPHIL % 58 %    LYMPHOCYTE % 21 %    MONOCYTE % 12 %    EOSINOPHIL % 8 %    BASOPHIL % 2 %    NEUTROPHIL # 2.60 1.50 - 6.40 x10^3/uL    LYMPHOCYTE # 0.90 0.90 - 3.40 x10^3/uL    MONOCYTE # 0.50 0.20 - 0.90 x10^3/uL    EOSINOPHIL # 0.30 0.00 - 0.50 x10^3/uL    BASOPHIL # 0.10 0.00 - 0.20 x10^3/uL   POC BLOOD GLUCOSE (RESULTS)   Result Value Ref Range    GLUCOSE, POC 226 (H) 70 - 110 mg/dl   POC BLOOD GLUCOSE (RESULTS)   Result Value Ref Range    GLUCOSE, POC 264 (H) 70 - 110  mg/dl   POC BLOOD GLUCOSE (RESULTS)   Result Value Ref Range    GLUCOSE, POC 87 70 - 110 mg/dl   POC BLOOD GLUCOSE (RESULTS)   Result Value Ref Range    GLUCOSE, POC 148 (H) 70 - 110 mg/dl         I have reviewed all labs.    Micro:   No results found for any visits on 08/25/18 (from the past 96 hour(s)).    Radiology:       Images in the chart reviewed    Right upper quadrant ultrasound  08/25/2018    IMPRESSION:    1. Pancreas is not well evaluated by ultrasound. In the usual location of  the pancreatic head (which is absent due to Whipple procedure) there is  heterogeneous soft tissue and may be associated with the liver but mass  cannot be excluded by ultrasound. Further evaluation with contrast-enhanced  CT would be required to further evaluate this.  2. Mild hepatomegaly. Slight nodularity along liver edge and slight  coarsening of the hepatic echotexture suggests a degree of underlying  fibrosis.  3. Small volume of right upper quadrant ascites.  4. Right pleural effusion.      PT/OT: No    Consults:  Cardiology    Assessment/ Plan:   Active Hospital  Problems    Diagnosis   . Primary Problem: Anasarca   . PE (pulmonary thromboembolism) (CMS HCC)   . Hypokalemia   . Open abdominal wall wound   . Pancreatic cancer (CMS [place])   . Type 2 diabetes mellitus (CMS HCC)   . Asthma       PLAN:    Hyponatremia  Perhaps secondary to diuresis and low sodium diet  Will give 3 tabs of sodium chloride and see if this improves it    Anasarca, I believe secondary to previously undiagnosed liver cirrhosis, although diastolic CHF could be a component  - right upper quadrant ultrasound showing some her hepatomegaly and minimal changes consistent with cirrhosis  - LFTs stable  -she is status post Whipple procedure  -diuresed well with IV Lasix, will transition to po furosemide and spironolactone  -hepatitis panel pending  -will need referral to GI upon discharge    Pancreatic cancer, diagnosed 2018  - status post robotic surgery and chemo and radiation   - she stopped the chemotherapy in July 2019 due to side effects and has been monitored with what sound like serial MRCPs  - follow up Oncology as outpatient    Deconditioning  -PT recommends SNF    Essential Hypertension  -blood pressure is slightly elevated today.  -will continue losartan   - continue metoprolol at reduced dose  -unable to up titrate her metoprolol at this time due to her borderline heart rate  - continue hydralazine     Recent PE  - Eliquis 2.5 b.i.d.  Currently being held  - restart Eliquis    Mild confusion/encephalopathy, metabolic  -likely related to acute illness and possible infectious process  -could also be due to intracranial edema that has improved with diuresis  -continue supportive care as well as diuresis as above    urinary tract infection.    - completed Unasyn for 3 days total treatment      NSTEMI, type II  -with troponin elevation and peak at 0.46  - troponin down to 0.06      DVT prophylaxis with apixaban    DNR    Melton Krebs, MD  09/02/2018 13:50

## 2018-09-02 NOTE — Care Plan (Signed)
Problem: Fall Injury Risk  Goal: Absence of Fall and Fall-Related Injury  Outcome: Ongoing (see interventions/notes)  Intervention: Promote Injury-Free Environment  Flowsheets  Taken 09/02/2018 2313  Safety Promotion/Fall Prevention: activity supervised;fall prevention program maintained;nonskid shoes/slippers when out of bed;safety round/check completed  Taken 09/02/2018 2050  Environmental Safety Modification: assistive device/personal items within reach;clutter free environment maintained;room organization consistent     Problem: Fluid Volume Excess  Goal: Fluid Balance  Outcome: Ongoing (see interventions/notes)  Intervention: Monitor and Manage Hypervolemia  Flowsheets (Taken 09/02/2018 2050)  Skin Protection: adhesive use limited;drying agents applied;incontinence pads utilized;pouching devices used;preventative decubiti skin protection foam dressing applied/intact;tubing/devices free from skin contact  Fluid/Electrolyte Management: fluids provided     Problem: Pain (Acute Coronary Syndrome)  Goal: Absence of Cardiac-Related Pain  Outcome: Ongoing (see interventions/notes)  Intervention: Manage Cardiac Pain Symptoms  Flowsheets (Taken 09/02/2018 2313)  Chest Pain Intervention: cardiac monitoring continued; oxygen applied     Problem: Hemodynamic Instability (Acute Coronary Syndrome)  Goal: Effective Cardiac Pump Function  Outcome: Ongoing (see interventions/notes)     Problem: Tissue Perfusion (Acute Coronary Syndrome)  Goal: Adequate Tissue Perfusion  Outcome: Ongoing (see interventions/notes)  Intervention: Optimize Cardiac Tissue Perfusion  Flowsheets  Taken 08/25/2018 2323  Airway/Ventilation Management: calming measures promoted  Taken 09/02/2018 2313  Activity Management: activity encouraged;ROM, active encouraged;activity adjusted per tolerance  Taken 09/02/2018 2050  Environmental Support: calm environment promoted;distractions minimized;rest periods encouraged     Problem: Diabetes Comorbidity  Goal: Blood  Glucose Level Within Desired Range  Outcome: Ongoing (see interventions/notes)  Intervention: Maintain Glycemic Control  Flowsheets (Taken 09/02/2018 2050)  Glycemic Management: blood glucose monitoring;supplemental insulin given     Problem: Skin Injury Risk Increased  Goal: Skin Health and Integrity  Outcome: Ongoing (see interventions/notes)  Intervention: Optimize Skin Protection  Flowsheets  Taken 09/02/2018 2050 by Marice Potter, RN  Pressure Reduction Techniques: frequent weight shift encouraged  Skin Protection: adhesive use limited;drying agents applied;incontinence pads utilized;pouching devices used;preventative decubiti skin protection foam dressing applied/intact;tubing/devices free from skin contact  Head of Bed (HOB): HOB at 45 degrees  Taken 08/30/2018 2000 by Jenne Campus, RN  Pressure Reduction Devices: heels elevated off bed     Problem: Acute Rehab Services Goal & Intervention Plan  Goal: Physical Therapy Goal  Description  Stand Alone Therapy Goal  Outcome: Ongoing (see interventions/notes)

## 2018-09-02 NOTE — Nurses Notes (Signed)
Pt is lying in bed resting with eyes closed. VSS. Pt has remained free of falls. No significant events throughout shift and pt appears to be stable at this time. Will continue to monitor.    Marice Potter, RN  09/02/2018, 05:20

## 2018-09-03 ENCOUNTER — Inpatient Hospital Stay (HOSPITAL_COMMUNITY): Payer: Medicare Other

## 2018-09-03 DIAGNOSIS — Z8507 Personal history of malignant neoplasm of pancreas: Secondary | ICD-10-CM

## 2018-09-03 DIAGNOSIS — R531 Weakness: Secondary | ICD-10-CM

## 2018-09-03 DIAGNOSIS — R609 Edema, unspecified: Secondary | ICD-10-CM

## 2018-09-03 DIAGNOSIS — E871 Hypo-osmolality and hyponatremia: Secondary | ICD-10-CM

## 2018-09-03 DIAGNOSIS — R197 Diarrhea, unspecified: Secondary | ICD-10-CM

## 2018-09-03 LAB — CBC WITH DIFF
BASOPHIL #: 0.1 10*3/uL (ref 0.00–0.20)
BASOPHIL %: 2 %
EOSINOPHIL #: 0.1 x10ˆ3/uL (ref 0.00–0.50)
EOSINOPHIL %: 3 %
HCT: 27 % — ABNORMAL LOW (ref 34.6–46.2)
HGB: 9.2 g/dL — ABNORMAL LOW (ref 11.8–15.8)
LYMPHOCYTE #: 0.8 10*3/uL — ABNORMAL LOW (ref 0.90–3.40)
LYMPHOCYTE %: 18 %
MCH: 31.2 pg (ref 27.6–33.2)
MCHC: 33.9 g/dL (ref 32.6–35.4)
MCV: 92.1 fL (ref 82.3–96.7)
MONOCYTE #: 0.6 x10ˆ3/uL (ref 0.20–0.90)
MONOCYTE %: 12 %
MPV: 9.5 fL (ref 6.6–10.2)
NEUTROPHIL #: 3 x10ˆ3/uL (ref 1.50–6.40)
NEUTROPHIL %: 65 %
PLATELETS: 221 x10ˆ3/uL (ref 140–440)
RBC: 2.94 10*6/uL — ABNORMAL LOW (ref 3.80–5.24)
RDW: 14.4 % (ref 12.4–15.2)
RDW: 14.4 % (ref 12.4–15.2)
WBC: 4.7 10*3/uL (ref 3.5–10.3)

## 2018-09-03 LAB — URINALYSIS, MACRO/MICRO
BILIRUBIN: NEGATIVE mg/dL
BLOOD: NEGATIVE mg/dL
GLUCOSE: NEGATIVE mg/dL
KETONES: NEGATIVE mg/dL
NITRITE: NEGATIVE
PH: 7.5 — ABNORMAL HIGH (ref 5.0–7.0)
PROTEIN: NEGATIVE mg/dL
SPECIFIC GRAVITY: 1.011 (ref 1.010–1.025)
UROBILINOGEN: 0.2 mg/dL

## 2018-09-03 LAB — OSMOLALITY: OSMOLALITY, BLOOD: 256 mosm/kg — ABNORMAL LOW (ref 280–300)

## 2018-09-03 LAB — HEPATIC FUNCTION PANEL
ALBUMIN: 2 g/dL — ABNORMAL LOW (ref 3.2–4.6)
ALKALINE PHOSPHATASE: 331 U/L — ABNORMAL HIGH (ref 20–130)
ALT (SGPT): 9 U/L (ref <=52)
AST (SGOT): 17 U/L (ref <=35)
BILIRUBIN DIRECT: 0.6 mg/dL — ABNORMAL HIGH (ref <=0.3)
BILIRUBIN TOTAL: 1.3 mg/dL — ABNORMAL HIGH (ref 0.3–1.2)
PROTEIN TOTAL: 4.1 g/dL — ABNORMAL LOW (ref 6.0–8.3)

## 2018-09-03 LAB — ECG 12 LEAD - ADULT
Calculated P Axis: 38 deg
Calculated T Axis: 59 deg
EKG Severity: ABNORMAL
Heart Rate: 61 {beats}/min
I 40 Axis: 43 deg
PR Interval: 174 ms
QRS Axis: -7 deg
QRS Duration: 106 ms
QT Interval: 485 ms
QTC Calculation: 489 ms
ST Axis: 61 deg
T 40 Axis: -24 deg

## 2018-09-03 LAB — BASIC METABOLIC PANEL, FASTING
ANION GAP: 5 mmol/L
BUN/CREA RATIO: 12
BUN: 8 mg/dL — ABNORMAL LOW (ref 10–25)
CALCIUM: 7.7 mg/dL — ABNORMAL LOW (ref 8.8–10.3)
CHLORIDE: 92 mmol/L — ABNORMAL LOW (ref 98–111)
CO2 TOTAL: 27 mmol/L (ref 21–35)
CREATININE: 0.66 mg/dL (ref <=1.30)
ESTIMATED GFR: >60 mL/min/1.73mˆ2
GLUCOSE: 74 mg/dL (ref 70–110)
POTASSIUM: 4 mmol/L (ref 3.5–5.0)
SODIUM: 124 mmol/L — ABNORMAL LOW (ref 135–145)

## 2018-09-03 LAB — SODIUM
SODIUM: 123 mmol/L — ABNORMAL LOW (ref 135–145)
SODIUM: 122 mmol/L — ABNORMAL LOW (ref 135–145)

## 2018-09-03 LAB — TROPONIN-I
TROPONIN I: 0.01 ng/mL (ref <=0.04)
TROPONIN I: 0.01 ng/mL (ref <=0.04)
TROPONIN I: 0.01 ng/mL (ref <=0.04)

## 2018-09-03 LAB — OSMOLALITY, RANDOM URINE: OSMOLALITY URINE: 311 mosm/kg (ref 250–900)

## 2018-09-03 LAB — UREA NITROGEN, RANDOM URINE: UREA NITROGEN RANDOM URINE: 117 mg/dL

## 2018-09-03 LAB — POC BLOOD GLUCOSE (RESULTS)
GLUCOSE, POC: 67 mg/dL — ABNORMAL LOW (ref 70–110)
GLUCOSE, POC: 67 mg/dL — ABNORMAL LOW (ref 70–110)
GLUCOSE, POC: 99 mg/dL (ref 70–110)
GLUCOSE, POC: 103 mg/dL (ref 70–110)
GLUCOSE, POC: 286 mg/dL — ABNORMAL HIGH (ref 70–110)

## 2018-09-03 LAB — PT/INR: INR: 1.21 — ABNORMAL HIGH (ref 0.80–1.10)

## 2018-09-03 LAB — PHOSPHORUS: PHOSPHORUS: 3 mg/dL (ref 2.5–4.5)

## 2018-09-03 LAB — MAGNESIUM: MAGNESIUM: 1.9 mg/dL (ref 1.8–2.3)

## 2018-09-03 LAB — CREATININE URINE, RANDOM: CREATININE RANDOM URINE: 31 mg/dL

## 2018-09-03 LAB — AMMONIA: AMMONIA: 29 umol/L (ref 18–50)

## 2018-09-03 LAB — SODIUM, RANDOM URINE: SODIUM RANDOM URINE: 95 mmol/L

## 2018-09-03 MED ORDER — SODIUM CHLORIDE 0.9 % INTRAVENOUS SOLUTION
2.0000 g | INTRAVENOUS | Status: DC
Start: 2018-09-03 — End: 2018-09-04
  Administered 2018-09-03: 0 g via INTRAVENOUS
  Administered 2018-09-03 – 2018-09-04 (×2): 2 g via INTRAVENOUS
  Administered 2018-09-04: 0 g via INTRAVENOUS
  Filled 2018-09-03 (×3): qty 20

## 2018-09-03 MED ORDER — SODIUM CHLORIDE 0.9 % INTRAVENOUS SOLUTION
INTRAVENOUS | Status: DC
Start: 2018-09-03 — End: 2018-09-04

## 2018-09-03 MED ADMIN — sodium chloride 0.9 % (flush) injection syringe: @ 14:00:00

## 2018-09-03 MED ADMIN — Medication: INTRAVENOUS | @ 08:00:00

## 2018-09-03 MED ADMIN — lanolin-oxyquin-pet, hydrophil topical ointment: INTRAVENOUS | @ 09:00:00 | NDC 09999989257

## 2018-09-03 NOTE — Nurses Notes (Signed)
Pt is resting quietly with eyes closed. VSS. Pt became diaphoretic, hot, weak and complained of SOB this morning. Temperature checked 36.9 and blankets removed and cool towels applied. 30 minutes later pt continues to sweat and has increased SOB. Physician paged and orders entered. Warm bath given and oral/axillary temperature were unable to be obtained, so rectal temp was 34.6. Hot packs and warm blankets applied. Physician paged and orders given. Bear hugger applied on highest settings and temp rechecked after 45 minutes later 34.7. Pt continued on highest setting and temperature will be reassessed. Pt appears to be stable at this time. Will continue to monitor.    Marice Potter, RN  09/03/2018, 05:18

## 2018-09-03 NOTE — Care Plan (Signed)
Problem: Fall Injury Risk  Goal: Absence of Fall and Fall-Related Injury  Outcome: Ongoing (see interventions/notes)     Problem: Fluid Volume Excess  Goal: Fluid Balance  Outcome: Ongoing (see interventions/notes)     Problem: Pain (Acute Coronary Syndrome)  Goal: Absence of Cardiac-Related Pain  Outcome: Ongoing (see interventions/notes)     Problem: Hemodynamic Instability (Acute Coronary Syndrome)  Goal: Effective Cardiac Pump Function  Outcome: Ongoing (see interventions/notes)     Problem: Tissue Perfusion (Acute Coronary Syndrome)  Goal: Adequate Tissue Perfusion  Outcome: Ongoing (see interventions/notes)     Problem: Diabetes Comorbidity  Goal: Blood Glucose Level Within Desired Range  Outcome: Ongoing (see interventions/notes)     Problem: Skin Injury Risk Increased  Goal: Skin Health and Integrity  Outcome: Ongoing (see interventions/notes)     Problem: Acute Rehab Services Goal & Intervention Plan  Goal: Physical Therapy Goal  Description  Stand Alone Therapy Goal  Outcome: Ongoing (see interventions/notes)

## 2018-09-03 NOTE — Care Plan (Signed)
[facility]  Physical Therapy Progress Note    Patient Name: [name]  Date of Birth: 04-03-1946  Height:  152.4 cm (5')  Weight:  76.4 kg (168 lb 6.4 oz)  Room/Bed: 6128/A  Payor: MEDICARE / Plan: MEDICARE PART A AND B / Product Type: Medicare /     Assessment:     Pt participates well with PT. Amb 29ft x 3 in room with rollator walker - will attempt gait in hall next session. Participated well with exercise. Will cont to follow.     Discharge Needs:   Equipment Recommendation: bedside commode    Discharge Disposition: skilled nursing facility    JUSTIFICATION OF DISCHARGE RECOMMENDATION   Based on current diagnosis, functional performance prior to admission, and current functional performance, this patient requires continued PT services in skilled nursing facility in order to achieve significant functional improvements in these deficit areas: gait, locomotion, and balance.      Plan:   Continue to follow patient according to established plan of care.  The risks/benefits of therapy have been discussed with the patient/caregiver and he/she is in agreement with the established plan of care.     Subjective & Objective:        09/03/18 1415   Therapist Pager   PT Assigned/ Pager # Briscoe Deutscher [phone]   Rehab Session   Document Type therapy progress note (daily note)   Total PT Minutes: 26   Patient Effort good   Symptoms Noted During/After Treatment fatigue   General Information   Patient Profile Reviewed? yes   Patient/Family/Caregiver Comments/Observations Pt agreeable to PT. "I feel a lot better than I did this morning."    Medical Lines Central Line;Telemetry   Respiratory Status nasal cannula   Existing Precautions/Restrictions fall precautions   Pre Treatment Status   Pre Treatment Patient Status Patient sitting in bedside chair or w/c;Call light within reach;Telephone within reach   Support Present Pre Treatment  None   Cognitive Assessment/Interventions      Behavior/Mood Observations behavior appropriate to situation, WNL/WFL   Orientation Status oriented x 4   Attention WNL/WFL   Follows Commands WNL   Pain Assessment   Pain Scale: Numbers, Pretreatment 0/10 - no pain   Pain Scale: Numbers, Post-Treatment 0/10 - no pain   Pre/Post Treatment Pain Comment Pt reports headache this am.    Bed Mobility Assessment/Treatment   Supine-Sit Independence not tested   Transfer Assessment/Treatment   Sit-Stand Independence supervision required   Stand-Sit Independence supervision required   Sit-Stand-Sit, Assist Device walker, four wheeled   Transfer Impairments endurance;strength decreased   Transfer Comment Pt using rollator FWW   Gait Assessment/Treatment   Independence  contact guard assist;stand-by assistance   Assistive Device  walker, four wheeled   Distance in Feet 71ft x 3  (amb around room)   Gait Speed slow, steady and functional   Impairments  balance impaired;endurance   Comment Pt using rollator walker for gait. Amb in room d/t IV and O2 and no chair follow. Will attempt gait in hall next session.    Therapeutic Exercise/Activity   Lower Extremity ankle pumps;LAQ (long arc quad), bilateral;marching while seated  (hip AB/AD, bilateral)   Lower Extremity Range of Motion bilateral;hip flexion/extension;hip abduction/adduction;knee flexion/extension;ankle dorsiflexion/plantarflexion   Exercise Type AROM (active range of motion)   Position  seated   Sets/Reps  1 x 20 reps each   Post Treatment Status   Post Treatment Patient Status Patient sitting in  bedside chair or w/c;Call light within reach;Telephone within reach   Support Present Post Treatment  None   Physical Therapy Clinical Impression   Assessment Pt participates well with PT. Amb 8ft x 3 in room with rollator walker - will attempt gait in hall next session. Participated well with exercise. Will cont to follow.        Therapist:   Briscoe Deutscher, PTA   Pager #: [phone]

## 2018-09-03 NOTE — Care Management Notes (Signed)
Conversation with [employer] @ [facility]. States will place at potential admission on 9/24. Stann Ore, CASE MANAGER

## 2018-09-03 NOTE — Care Plan (Signed)
Patient alert and oriented. Serial sodiums drawn per order. IVF running per order. Pt up with 1 and walker, incontinent of urine, continent of bowel. Vs stable BP (!) 139/51   Pulse 70   Temp 37.1 C (98.8 F)   Resp 18   Ht 1.524 m (5')   Wt 76.4 kg (168 lb 6.4 oz)   SpO2 100%   BMI 32.89 kg/m

## 2018-09-03 NOTE — Care Plan (Signed)
PT session deferred by pt stating her diarrhea has been bad this afternoon and she knows if she moves she will go again. Bed ex reviewed and pt denies questions. Pt encouraged to be OOB w/ staff t/o the weekend. PT to f./u as staffing permits and appropriate Monday 09/03/2018.    Deep Bonawitz L. [place] PT    [phone]

## 2018-09-03 NOTE — Progress Notes (Signed)
[facility]  Hospitalist Progress Note    [name]       72 y.o.       Date of service: 09/03/2018  Date of Admission:  08/25/2018    Hospital Day:  LOS: 9 days   CC: Anasarca    Subjective:    Doing okay, felt a little lightheaded today, otherwise doing okay.      Objective:   Filed Vitals:    09/03/18 0700 09/03/18 0900 09/03/18 1130 09/03/18 1440   BP: (!) 147/56   (!) 150/85   Pulse: 72      Resp: 16      Temp: 37.3 C (99.1 F) 37.5 C (99.5 F) 37.3 C (99.1 F) 37.5 C (99.5 F)   SpO2: 100%        O2 delivery: Nasal Cannula     I have reviewed the vitals.      Input/Output    Intake/Output Summary (Last 24 hours) at 09/03/2018 1557  Last data filed at 09/03/2018 0900  Gross per 24 hour   Intake 245 ml   Output --   Net 245 ml           Current Facility-Administered Medications:  acetaminophen (TYLENOL) tablet 650 mg Oral Q6H PRN   albuterol (PROVENTIL) 2.5 mg / 3 mL (0.083%) neb solution 2.5 mg Nebulization Q4H PRN   apixaban (ELIQUIS) tablet 2.5 mg Oral 2x/day   aspirin (ECOTRIN) enteric coated tablet 81 mg 81 mg Oral Daily   atorvastatin (LIPITOR) tablet 10 mg Oral QPM   cefTRIAXone (ROCEPHIN) 2 g in NS 50 mL IVPB 2 g Intravenous Q24H   citalopram (CELEXA) tablet 20 mg Oral Daily   dextrose 50% (0.5 g/mL) injection - syringe 25 g Intravenous Q15 Min PRN   diphenhydrAMINE (BENADRYL) capsule 25 mg Oral HS PRN   furosemide (LASIX) tablet 40 mg Oral Daily   hydrALAZINE (APRESOLINE) tablet 10 mg Oral Q8HRS   HYDROcodone-acetaminophen (NORCO) 5-325 mg per tablet 1 Tab Oral Q4H PRN   hydrocortisone-pramoxine (PROCTOFOAM HC) 1-1% rectal foam 1 Applicator Rectal J1B   losartan (COZAAR) tablet 100 mg Oral Daily   metoprolol succinate (TOPROL-XL) 24 hr extended release tablet 25 mg Oral Daily   NS flush syringe 3 mL Intracatheter Q8HRS   NS flush syringe 3 mL Intracatheter Q1H PRN   NS premix infusion  Intravenous Continuous   nystatin (NYSTOP) 100,000 units/g topical powder  Apply Topically 3x/day    ondansetron (ZOFRAN) 2 mg/mL injection 4 mg Intravenous Q6H PRN   pantoprazole (PROTONIX) delayed release tablet 40 mg Oral Daily before Breakfast   spironolactone (ALDACTONE) tablet 50 mg Oral Daily with Breakfast   SSIP insulin lispro (HUMALOG) 100 units/mL injection 1-12 Units Subcutaneous 4x/day AC       Physical Exam:  General: No acute distress  HEENT: Oropharynx is clear without lesions.  Cardiac: Regular rate and rhythm, no murmur auscultated.  Respiratory: Clear to auscultation bilaterally without wheeze.  Abdomen: Positive bowel sounds, soft, nontender  Extremities:  Trace Bilateral lower extremity edema     Labs:   Results for orders placed or performed during the hospital encounter of 08/25/18 (from the past 24 hour(s))   BASIC METABOLIC PANEL, FASTING   Result Value Ref Range    SODIUM 124 (L) 135 - 145 mmol/L    POTASSIUM 4.0 3.5 - 5.0 mmol/L    CHLORIDE 92 (L) 98 - 111 mmol/L    CO2 TOTAL 27 21 - 35 mmol/L  ANION GAP 5 mmol/L    CALCIUM 7.7 (L) 8.8 - 10.3 mg/dL    GLUCOSE 74 70 - 110 mg/dL    BUN 8 (L) 10 - 25 mg/dL    CREATININE 0.66 <=1.30 mg/dL    BUN/CREA RATIO 12     ESTIMATED GFR >60 Avg: 75 mL/min/1.63m^2   CBC/DIFF    Narrative    The following orders were created for panel order CBC/DIFF.  Procedure                               Abnormality         Status                     ---------                               -----------         ------                     CBC WITH OVFI[[id number]]                Abnormal            Final result                 Please view results for these tests on the individual orders.   CBC WITH DIFF   Result Value Ref Range    WBC 4.7 3.5 - 10.3 x10^3/uL    RBC 2.94 (L) 3.80 - 5.24 x10^6/uL    HGB 9.2 (L) 11.8 - 15.8 g/dL    HCT 27.0 (L) 34.6 - 46.2 %    MCV 92.1 82.3 - 96.7 fL    MCH 31.2 27.6 - 33.2 pg    MCHC 33.9 32.6 - 35.4 g/dL    RDW 14.4 12.4 - 15.2 %    PLATELETS 221 140 - 440 x10^3/uL    MPV 9.5 6.6 - 10.2 fL    NEUTROPHIL % 65 %    LYMPHOCYTE % 18 %     MONOCYTE % 12 %    EOSINOPHIL % 3 %    BASOPHIL % 2 %    NEUTROPHIL # 3.00 1.50 - 6.40 x10^3/uL    LYMPHOCYTE # 0.80 (L) 0.90 - 3.40 x10^3/uL    MONOCYTE # 0.60 0.20 - 0.90 x10^3/uL    EOSINOPHIL # 0.10 0.00 - 0.50 x10^3/uL    BASOPHIL # 0.10 0.00 - 0.20 x10^3/uL   TROPONIN-I   Result Value Ref Range    TROPONIN I 0.01 <=0.04 ng/mL   TROPONIN-I   Result Value Ref Range    TROPONIN I 0.01 <=0.04 ng/mL   AMMONIA   Result Value Ref Range    AMMONIA 29 18 - 50 umol/L   MAGNESIUM   Result Value Ref Range    MAGNESIUM 1.9 1.8 - 2.3 mg/dL   PHOSPHORUS   Result Value Ref Range    PHOSPHORUS 3.0 2.5 - 4.5 mg/dL   HEPATIC FUNCTION PANEL   Result Value Ref Range    ALBUMIN 2.0 (L) 3.2 - 4.6 g/dL    ALKALINE PHOSPHATASE 331 (H) 20 - 130 U/L    ALT (SGPT) 9 <=52 U/L    AST (SGOT) 17 <=35 U/L    BILIRUBIN TOTAL 1.3 (H) 0.3 - 1.2 mg/dL    BILIRUBIN DIRECT 0.6 (  H) <=0.3 mg/dL    PROTEIN TOTAL 4.1 (L) 6.0 - 8.3 g/dL   PT/INR   Result Value Ref Range    INR 1.21 (H) 0.80 - 1.10    Narrative    Coumadin Therapy INR range for Conventional Anticoagulation Therapy is 2.0 to 3.0 and for Intensive Anticoagulation Therapy 2.5 to 3.5   TROPONIN-I   Result Value Ref Range    TROPONIN I 0.01 <=0.04 ng/mL   URINALYSIS WITH REFLEX MICROSCOPIC AND CULTURE IF POSITIVE    Narrative    The following orders were created for panel order URINALYSIS WITH REFLEX MICROSCOPIC AND CULTURE IF POSITIVE.  Procedure                               Abnormality         Status                     ---------                               -----------         ------                     URINALYSIS, MACRO/MICRO[[id number]]      Abnormal            Final result                 Please view results for these tests on the individual orders.   URINALYSIS, MACRO/MICRO   Result Value Ref Range    COLOR Yellow Yellow, Straw, Colorless    APPEARANCE Clear Clear, Cloudy    SPECIFIC GRAVITY 1.011 1.010 - 1.025    PH 7.5 (H) 5.0 - 7.0    LEUKOCYTES Trace (A) Negative WBCs/uL     NITRITE Negative Negative    PROTEIN Negative Negative, Trace mg/dL    GLUCOSE Negative Negative mg/dL    KETONES Negative Negative mg/dL    UROBILINOGEN 0.2  Normal, 0.2 , 1.0 mg/dL    BILIRUBIN Negative Negative mg/dL    BLOOD Negative Negative mg/dL    RBCS 0-2 (A) None /hpf    WBCS 0-5 (A) None /hpf    BACTERIA None None /hpf    SQUAMOUS EPITHELIAL Rare None, Rare, Occasional /hpf    HYALINE CASTS Occasional None, Rare, Few, Occasional, Many /lpf   CREATININE URINE, RANDOM   Result Value Ref Range    CREATININE RANDOM URINE 31 No known normals mg/dL   SODIUM, RANDOM URINE   Result Value Ref Range    SODIUM RANDOM URINE 95 No known normals mmol/L   UREA NITROGEN, RANDOM URINE   Result Value Ref Range    UREA NITROGEN RANDOM URINE 117 mg/dL   OSMOLALITY   Result Value Ref Range    OSMOLALITY, BLOOD 256 (L) 280 - 300 mOsm/kg   SODIUM   Result Value Ref Range    SODIUM 123 (L) 135 - 145 mmol/L   POC BLOOD GLUCOSE (RESULTS)   Result Value Ref Range    GLUCOSE, POC 263 (H) 70 - 110 mg/dl   POC BLOOD GLUCOSE (RESULTS)   Result Value Ref Range    GLUCOSE, POC 220 (H) 70 - 110 mg/dl   POC BLOOD GLUCOSE (RESULTS)   Result Value Ref Range    GLUCOSE, POC 67 (L) 70 -  110 mg/dl   POC BLOOD GLUCOSE (RESULTS)   Result Value Ref Range    GLUCOSE, POC 67 (L) 70 - 110 mg/dl   POC BLOOD GLUCOSE (RESULTS)   Result Value Ref Range    GLUCOSE, POC 99 70 - 110 mg/dl   POC BLOOD GLUCOSE (RESULTS)   Result Value Ref Range    GLUCOSE, POC 103 70 - 110 mg/dl         I have reviewed all labs.    Micro:   No results found for any visits on 08/25/18 (from the past 96 hour(s)).    Radiology:    Results for orders placed or performed during the hospital encounter of 08/25/18 (from the past 24 hour(s))   XR AP MOBILE CHEST     Status: None    Narrative    [name]    PROCEDURE DESCRIPTION: XR AP MOBILE CHEST.  PROCEDURE PERFORMED DATE AND TIME: 09/03/2018 1:57 AM.  CLINICAL INDICATION: dyspnea.  TECHNIQUE: 1 view(s) / 1 image(s)  submitted.  COMPARISON: 08/25/2018.    FINDINGS:  The RIGHT Port-A-Cath has a stable configuration.  No pneumothorax.  No pleural effusion.  No focal infiltrate.  Heart size is within normal range.  No acute bony abnormality is appreciated.  No significant change is identified.      Impression    1. No acute cardiopulmonary process is appreciated.     REP        Radiologist location ID: [id number]       Images in the chart reviewed    Right upper quadrant ultrasound  08/25/2018    IMPRESSION:    1. Pancreas is not well evaluated by ultrasound. In the usual location of  the pancreatic head (which is absent due to Whipple procedure) there is  heterogeneous soft tissue and may be associated with the liver but mass  cannot be excluded by ultrasound. Further evaluation with contrast-enhanced  CT would be required to further evaluate this.  2. Mild hepatomegaly. Slight nodularity along liver edge and slight  coarsening of the hepatic echotexture suggests a degree of underlying  fibrosis.  3. Small volume of right upper quadrant ascites.  4. Right pleural effusion.      PT/OT: No    Consults:  Cardiology    Assessment/ Plan:   Active Hospital Problems    Diagnosis   . Primary Problem: Anasarca   . PE (pulmonary thromboembolism) (CMS HCC)   . Hypokalemia   . Open abdominal wall wound   . Pancreatic cancer (CMS [place])   . Type 2 diabetes mellitus (CMS HCC)   . Asthma       PLAN:    Hyponatremia  Perhaps secondary to diuresis and low sodium diet  Urine sodium relatively high, may have SIADH component vs secondary to diuretics  3 tabs of sodium chloride didn't improve, will d/c.   Will take her off 1200 cc fluid restriction and give regular diet and start NS @ 75cc/hour and see if any changes with serial sodiums.    Nephrology following.    Will also check TSH and cortisol in AM    Anasarca, I believe secondary to previously undiagnosed liver cirrhosis, although diastolic CHF could be a component  - right upper quadrant  ultrasound showing some her hepatomegaly and minimal changes consistent with cirrhosis  - LFTs stable  -she is status post Whipple procedure  -diuresed well with IV Lasix, transitioned to po furosemide and spironolactone  -hepatitis panel negative  -  will need referral to GI upon discharge    Pancreatic cancer, diagnosed 2018  - status post robotic surgery and chemo and radiation   - she stopped the chemotherapy in July 2019 due to side effects and has been monitored with what sound like serial MRCPs  - follow up Oncology as outpatient    Deconditioning  -PT recommends SNF    Essential Hypertension  -blood pressure is slightly elevated today.  -will continue losartan   - continue metoprolol at reduced dose  -unable to up titrate her metoprolol at this time due to her borderline heart rate  - continue hydralazine     Recent PE  - continue Eliquis    Mild confusion/encephalopathy, metabolic -- resolved  -likely related to acute illness and possible infectious process  -could also be due to intracranial edema that has improved with diuresis    urinary tract infection.    - completed Unasyn for 3 days total treatment      NSTEMI, type II  -with troponin elevation and peak at 0.46 and trended down to 0.06  -cardiology signed off    DVT prophylaxis with apixaban    DNR    Melton Krebs, MD  09/03/2018 15:57

## 2018-09-03 NOTE — Consults (Signed)
[facility]  Nephrology   Consult note          [name]       [id number]       Date of service: 09/03/2018  Date of Admission:  08/25/2018    Hospital Day:  LOS: 9 days     Requesting Physician: DR Stann Mainland    Reason for Consultation: HYPONATREMIA      HISTORY OF PRESENT ILLNESS:    [name] is a 72 y.o. female who was admitted to [place] H/O RECENT PE ON ELIQUIS,HAS H/O PANCREATIC CA S/P SURGERY,RT,CHEMO,PERSISTANT HYPONATREMIA,HAD SOME DIARRHEA WITH IMPROVEMENT POOR INTAKE VERY LOW URINE SODIUM AND RELATIVELY HIGH URINE OSM,      Past Medical History:   Diagnosis Date   . Abdominal hernia 10/18/2017    hx of repair   . Anxiety    . Arthritis    . Asthma    . Atrial fibrillation (CMS HCC)    . Back problem    . Bruises easily    . Cancer (CMS [place]) 10/18/2017    chemo and radiation completed 09/24/2017   . COPD (chronic obstructive pulmonary disease) (CMS HCC)    . CPAP (continuous positive airway pressure) dependence 10/18/2017    has not used recently   . Depression    . DM (diabetes mellitus) (CMS HCC) 2000    Fasting BG 200's. HGA1C 2018 6   . Edema    . Essential hypertension    . GERD (gastroesophageal reflux disease)    . Headache    . Heartburn    . Hx antineoplastic chemo 2018   . Hx of radiation therapy 2018   . Hypercholesteremia    . Hyperlipidemia    . Migraine 10/18/2017    none recently   . Obesity    . Palpitations    . Pancreatic cancer (CMS [place]) 03/2017   . Panic attack    . PE (pulmonary thromboembolism) (CMS HCC) 03/29/2018   . Peripheral neuropathy 10/18/2017    knees down, bilateral   . Sleep apnea    . Type 2 diabetes mellitus (CMS HCC) 10/18/2017    Dx 2000 FBS 200s   . Unintentional weight loss 10/18/2017    25 lbs 03/2017   . Wears glasses            Past Surgical History:   Procedure Laterality Date   . CESAREAN SECTION     . HX HERNIA REPAIR     . HX SUBCLAVIAN PORT IMPLANTION      s/p removed   . HX SUBCLAVIAN PORT  IMPLANTION     . HX TONSIL AND ADENOIDECTOMY     . HX TONSILLECTOMY     . UMBILICAL HERNIA REPAIR      x2           Family Medical History:     Problem Relation (Age of Onset)    Diabetes Mother    Heart Disease Mother              Social History     Socioeconomic History   . Marital status: Widowed     Spouse name: Not on file   . Number of children: Not on file   . Years of education: Not on file   . Highest education level: Not on file   Occupational History   . Not on file   Social Needs   . Financial resource strain: Not on  file   . Food insecurity:     Worry: Not on file     Inability: Not on file   . Transportation needs:     Medical: Not on file     Non-medical: Not on file   Tobacco Use   . Smoking status: Former Smoker     Packs/day: 0.50     Years: 20.00     Pack years: 10.00     Last attempt to quit: 10/18/1993     Years since quitting: 24.8   . Smokeless tobacco: Never Used   Substance and Sexual Activity   . Alcohol use: No   . Drug use: No   . Sexual activity: Not Currently   Lifestyle   . Physical activity:     Days per week: Not on file     Minutes per session: Not on file   . Stress: Not on file   Relationships   . Social connections:     Talks on phone: Not on file     Gets together: Not on file     Attends religious service: Not on file     Active member of club or organization: Not on file     Attends meetings of clubs or organizations: Not on file     Relationship status: Not on file   . Intimate partner violence:     Fear of current or ex partner: Not on file     Emotionally abused: Not on file     Physically abused: Not on file     Forced sexual activity: Not on file   Other Topics Concern   . Ability to Walk 1 Flight of Steps without SOB/CP No     Comment: SOB, denies CP   . Routine Exercise Not Asked   . Ability to Walk 2 Flight of Steps without SOB/CP No   . Unable to Ambulate Not Asked   . Total Care Not Asked   . Ability To Do Own ADL's Yes   . Uses Walker Not Asked   . Other Activity  Level Yes     Comment: light housework   . Uses Cane Not Asked   Social History Narrative   . Not on file       Allergies   Allergen Reactions   . Sulfa (Sulfonamides) Itching       No current outpatient medications on file.       Patient Active Problem List   Diagnosis   . Pancreatic cancer (CMS [place])   . Asthma   . Type 2 diabetes mellitus (CMS HCC)   . Open abdominal wall wound   . Hypokalemia   . PE (pulmonary thromboembolism) (CMS HCC)   . Anasarca         REVIEW OF SYSTEMS:    Other than ROS in the HPI, all other systems were negative.    Filed Vitals:    09/03/18 0543 09/03/18 0656 09/03/18 0700 09/03/18 0900   BP:   (!) 147/56    Pulse:   72    Resp:   16    Temp: 35.3 C (95.6 F) 36.4 C (97.5 F) 37.3 C (99.1 F) 37.5 C (99.5 F)   SpO2:   100%      I have reviewed the vitals.      PHYSICAL EXAM  Constitutional: appears chronically ill, acutely ill, pale and no distress  Eyes: Conjunctiva clear.  ENT: ENMT without erythema or injection, mucous membranes  moist.  Neck: no thyromegaly or lymphadenopathy  Respiratory: Clear to auscultation bilaterally.   Cardiovascular: regular rate and rhythm  Gastrointestinal: Soft, non-tender  Genitourinary: Deferred  Musculoskeletal: Head atraumatic and normocephalic  Integumentary:  Skin warm and dry  Neurologic: Grossly normal  Lymphatic/Immunologic/Hematologic: No lymphadenopathy  Psychiatric: Normal  EXT TRACE EDEMA        Labs:   CBC  (Last 24 hours)    Date/Time WBC HGB HCT MCV PLATELETS    09/03/18 0200 4.7    9.2 (L)    27.0 (L)    92.1    221         WBC/Diff  (Last 24 hours)    Date/Time WBC Bands PMNs Lymphs Mono Eos Basos    09/03/18 0200 4.7    -- 65    18    12    3    2              BMP  (Last 24 hours)    Date/Time Na K Cl CO2 BUN CREAT Calcium Glucose    09/03/18 0200 124 (L)    4.0    92 (L)    27    8 (L)    0.66    7.7 (L)    74               I have reviewed labs.      Current Facility-Administered Medications:   .  acetaminophen (TYLENOL) tablet, 650  mg, Oral, Q6H PRN, Dorathy Kinsman, MD, 650 mg at 09/02/18 0647  .  albuterol (PROVENTIL) 2.5 mg / 3 mL (0.083%) neb solution, 2.5 mg, Nebulization, Q4H PRN, Dorathy Kinsman, MD  .  apixaban (ELIQUIS) tablet, 2.5 mg, Oral, 2x/day, Dorathy Kinsman, MD, 2.5 mg at 09/03/18 0923  .  aspirin (ECOTRIN) enteric coated tablet 81 mg, 81 mg, Oral, Daily, Dorathy Kinsman, MD, 81 mg at 09/03/18 0923  .  atorvastatin (LIPITOR) tablet, 10 mg, Oral, QPM, Dorathy Kinsman, MD, 10 mg at 09/02/18 2058  .  cefTRIAXone (ROCEPHIN) 2 g in NS 50 mL IVPB, 2 g, Intravenous, Q24H, Shelbie Hutching, NP, Stopped at 09/03/18 0815  .  citalopram (CELEXA) tablet, 20 mg, Oral, Daily, Dorathy Kinsman, MD, 20 mg at 09/03/18 0923  .  dextrose 50% (0.5 g/mL) injection - syringe, 25 g, Intravenous, Q15 Min PRN, Spratt, Justin, DO  .  diphenhydrAMINE (BENADRYL) capsule, 25 mg, Oral, HS PRN, Doristine Locks, MD, 25 mg at 08/27/18 2216  .  furosemide (LASIX) tablet, 40 mg, Oral, Daily, Lynford Humphrey., MD, 40 mg at 09/03/18 5638  .  hydrALAZINE (APRESOLINE) tablet, 10 mg, Oral, Q8HRS, Doristine Locks, MD, 10 mg at 09/03/18 0651  .  HYDROcodone-acetaminophen (NORCO) 5-325 mg per tablet, 1 Tab, Oral, Q4H PRN, Dorathy Kinsman, MD, 1 Tab at 09/02/18 2317  .  hydrocortisone-pramoxine (PROCTOFOAM HC) 1-1% rectal foam, 1 Applicator, Rectal, V5I, Shelbie Hutching, NP, Stopped at 09/03/18 0000  .  losartan (COZAAR) tablet, 100 mg, Oral, Daily, Dorathy Kinsman, MD, 100 mg at 09/03/18 0923  .  metoprolol succinate (TOPROL-XL) 24 hr extended release tablet, 25 mg, Oral, Daily, Dorathy Kinsman, MD, 25 mg at 09/03/18 0923  .  NS flush syringe, 3 mL, Intracatheter, Q8HRS, Dorathy Kinsman, MD, 3 mL at 09/03/18 0652  .  NS flush syringe, 3 mL, Intracatheter, Q1H PRN, Dorathy Kinsman, MD  .  NS premix infusion, , Intravenous, Continuous, Lynford Humphrey., MD, Last Rate: 75  mL/hr at 09/03/18  6295  .  nystatin (NYSTOP) 100,000 units/g topical powder, , Apply Topically, 3x/day, Harrop, Lanny Hurst, MD  .  ondansetron (ZOFRAN) 2 mg/mL injection, 4 mg, Intravenous, Q6H PRN, Dorathy Kinsman, MD, 4 mg at 09/02/18 1427  .  pantoprazole (PROTONIX) delayed release tablet, 40 mg, Oral, Daily before Breakfast, Dorathy Kinsman, MD, 40 mg at 09/03/18 0651  .  spironolactone (ALDACTONE) tablet, 50 mg, Oral, Daily with Breakfast, Lynford Humphrey., MD, 50 mg at 09/03/18 0924  .  SSIP insulin lispro (HUMALOG) 100 units/mL injection, 1-12 Units, Subcutaneous, 4x/day AC, Spratt, Justin, DO, Stopped at 09/03/18 0700    Micro: No results found for any visits on 08/25/18 (from the past 96 hour(s)).    Radiology:    Results for orders placed or performed during the hospital encounter of 08/25/18 (from the past 24 hour(s))   XR AP MOBILE CHEST     Status: None    Narrative    [name]    PROCEDURE DESCRIPTION: XR AP MOBILE CHEST.  PROCEDURE PERFORMED DATE AND TIME: 09/03/2018 1:57 AM.  CLINICAL INDICATION: dyspnea.  TECHNIQUE: 1 view(s) / 1 image(s) submitted.  COMPARISON: 08/25/2018.    FINDINGS:  The RIGHT Port-A-Cath has a stable configuration.  No pneumothorax.  No pleural effusion.  No focal infiltrate.  Heart size is within normal range.  No acute bony abnormality is appreciated.  No significant change is identified.      Impression    1. No acute cardiopulmonary process is appreciated.     REP        Radiologist location ID: [id number]         Assessment/Recommendations:   HYPONATREMIA MULTIFACTORIAL   SUPPLEMENT SODIUM CHLORIDE AND WATCH Na LEVEL,  CHECK TSH,CORTISOL LEVELS  Donny Pique, MD

## 2018-09-04 ENCOUNTER — Non-Acute Institutional Stay
Admission: RE | Admit: 2018-09-04 | Discharge: 2018-09-25 | DRG: 281 | Disposition: A | Discharge status: Home Health | Payer: Medicare Other | Source: Ambulatory Visit | Attending: Hospitalist | Admitting: Hospitalist

## 2018-09-04 ENCOUNTER — Non-Acute Institutional Stay (SKILLED_NURSING_FACILITY): Payer: Self-pay

## 2018-09-04 ENCOUNTER — Other Ambulatory Visit: Payer: Self-pay

## 2018-09-04 ENCOUNTER — Non-Acute Institutional Stay (SKILLED_NURSING_FACILITY): Payer: Medicare Other | Admitting: Hospitalist

## 2018-09-04 DIAGNOSIS — I509 Heart failure, unspecified: Secondary | ICD-10-CM

## 2018-09-04 DIAGNOSIS — E785 Hyperlipidemia, unspecified: Secondary | ICD-10-CM | POA: Diagnosis present

## 2018-09-04 DIAGNOSIS — D630 Anemia in neoplastic disease: Secondary | ICD-10-CM | POA: Diagnosis present

## 2018-09-04 DIAGNOSIS — R197 Diarrhea, unspecified: Secondary | ICD-10-CM

## 2018-09-04 DIAGNOSIS — Z86711 Personal history of pulmonary embolism: Secondary | ICD-10-CM

## 2018-09-04 DIAGNOSIS — I214 Non-ST elevation (NSTEMI) myocardial infarction: Secondary | ICD-10-CM | POA: Diagnosis present

## 2018-09-04 DIAGNOSIS — R2681 Unsteadiness on feet: Secondary | ICD-10-CM

## 2018-09-04 DIAGNOSIS — J449 Chronic obstructive pulmonary disease, unspecified: Secondary | ICD-10-CM | POA: Diagnosis present

## 2018-09-04 DIAGNOSIS — R5381 Other malaise: Secondary | ICD-10-CM | POA: Diagnosis present

## 2018-09-04 DIAGNOSIS — I4891 Unspecified atrial fibrillation: Secondary | ICD-10-CM | POA: Diagnosis present

## 2018-09-04 DIAGNOSIS — J9690 Respiratory failure, unspecified, unspecified whether with hypoxia or hypercapnia: Secondary | ICD-10-CM

## 2018-09-04 DIAGNOSIS — J45909 Unspecified asthma, uncomplicated: Secondary | ICD-10-CM | POA: Diagnosis present

## 2018-09-04 DIAGNOSIS — F329 Major depressive disorder, single episode, unspecified: Secondary | ICD-10-CM | POA: Diagnosis present

## 2018-09-04 DIAGNOSIS — R531 Weakness: Secondary | ICD-10-CM

## 2018-09-04 DIAGNOSIS — E871 Hypo-osmolality and hyponatremia: Secondary | ICD-10-CM

## 2018-09-04 DIAGNOSIS — E119 Type 2 diabetes mellitus without complications: Secondary | ICD-10-CM | POA: Diagnosis present

## 2018-09-04 DIAGNOSIS — I5032 Chronic diastolic (congestive) heart failure: Secondary | ICD-10-CM | POA: Diagnosis present

## 2018-09-04 DIAGNOSIS — C259 Malignant neoplasm of pancreas, unspecified: Secondary | ICD-10-CM | POA: Diagnosis present

## 2018-09-04 DIAGNOSIS — I2699 Other pulmonary embolism without acute cor pulmonale: Secondary | ICD-10-CM

## 2018-09-04 DIAGNOSIS — E162 Hypoglycemia, unspecified: Secondary | ICD-10-CM

## 2018-09-04 DIAGNOSIS — I110 Hypertensive heart disease with heart failure: Principal | ICD-10-CM | POA: Diagnosis present

## 2018-09-04 DIAGNOSIS — R601 Generalized edema: Secondary | ICD-10-CM | POA: Diagnosis present

## 2018-09-04 DIAGNOSIS — R0600 Dyspnea, unspecified: Secondary | ICD-10-CM

## 2018-09-04 LAB — BASIC METABOLIC PANEL, FASTING
ANION GAP: 0 mmol/L
BUN/CREA RATIO: 11
BUN: 8 mg/dL — ABNORMAL LOW (ref 10–25)
CALCIUM: 7.5 mg/dL — ABNORMAL LOW (ref 8.8–10.3)
CHLORIDE: 98 mmol/L (ref 98–111)
CO2 TOTAL: 28 mmol/L (ref 21–35)
CREATININE: 0.72 mg/dL (ref <=1.30)
ESTIMATED GFR: >60 mL/min/1.73mˆ2
GLUCOSE: 61 mg/dL — ABNORMAL LOW (ref 70–110)
POTASSIUM: 4.2 mmol/L (ref 3.5–5.0)
SODIUM: 126 mmol/L — ABNORMAL LOW (ref 135–145)

## 2018-09-04 LAB — CBC WITH DIFF
BASOPHIL #: 0.1 10*3/uL (ref 0.00–0.20)
BASOPHIL %: 3 %
EOSINOPHIL #: 0.2 x10ˆ3/uL (ref 0.00–0.50)
EOSINOPHIL %: 5 %
HCT: 21.7 % — ABNORMAL LOW (ref 34.6–46.2)
HGB: 7.5 g/dL — ABNORMAL LOW (ref 11.8–15.8)
LYMPHOCYTE #: 0.8 x10ˆ3/uL — ABNORMAL LOW (ref 0.90–3.40)
LYMPHOCYTE %: 26 %
MCH: 31.6 pg (ref 27.6–33.2)
MCHC: 34.3 g/dL (ref 32.6–35.4)
MCV: 92.2 fL (ref 82.3–96.7)
MONOCYTE #: 0.5 x10ˆ3/uL (ref 0.20–0.90)
MONOCYTE %: 15 %
MPV: 10.1 fL (ref 6.6–10.2)
NEUTROPHIL #: 1.7 x10ˆ3/uL (ref 1.50–6.40)
NEUTROPHIL %: 51 %
PLATELETS: 149 x10ˆ3/uL (ref 140–440)
RBC: 2.36 10*6/uL — ABNORMAL LOW (ref 3.80–5.24)
RDW: 14.2 % (ref 12.4–15.2)
WBC: 3.2 x10ˆ3/uL — ABNORMAL LOW (ref 3.5–10.3)

## 2018-09-04 LAB — POC BLOOD GLUCOSE (RESULTS)
GLUCOSE, POC: 54 mg/dL — ABNORMAL LOW (ref 70–110)
GLUCOSE, POC: 118 mg/dL — ABNORMAL HIGH (ref 70–110)
GLUCOSE, POC: 184 mg/dL — ABNORMAL HIGH (ref 70–110)
GLUCOSE, POC: 197 mg/dL — ABNORMAL HIGH (ref 70–110)
GLUCOSE, POC: 240 mg/dL — ABNORMAL HIGH (ref 70–110)

## 2018-09-04 LAB — SODIUM
SODIUM: 124 mmol/L — ABNORMAL LOW (ref 135–145)
SODIUM: 127 mmol/L — ABNORMAL LOW (ref 135–145)
SODIUM: 127 mmol/L — ABNORMAL LOW (ref 135–145)

## 2018-09-04 LAB — THYROID STIMULATING HORMONE (SENSITIVE TSH): TSH: 2.9 u[IU]/mL (ref 0.450–5.330)

## 2018-09-04 LAB — CORTISOL, PLASMA OR SERUM: CORTISOL: 10.3 ug/dL

## 2018-09-04 MED ORDER — LOSARTAN 50 MG TABLET
100.0000 mg | ORAL_TABLET | Freq: Every day | ORAL | Status: DC
Start: 2018-09-05 — End: 2018-09-25
  Administered 2018-09-05 – 2018-09-25 (×21): 100 mg via ORAL
  Filled 2018-09-04 (×22): qty 2

## 2018-09-04 MED ORDER — HYDROCODONE 5 MG-ACETAMINOPHEN 325 MG TABLET
1.0000 | ORAL_TABLET | ORAL | Status: DC | PRN
Start: 2018-09-04 — End: 2018-09-25
  Administered 2018-09-04 – 2018-09-25 (×25): 1 via ORAL
  Filled 2018-09-04 (×26): qty 1

## 2018-09-04 MED ORDER — CITALOPRAM 20 MG TABLET
20.0000 mg | ORAL_TABLET | Freq: Every day | ORAL | Status: DC
Start: 2018-09-05 — End: 2018-09-25
  Administered 2018-09-05 – 2018-09-25 (×22): 20 mg via ORAL
  Filled 2018-09-04 (×22): qty 1

## 2018-09-04 MED ORDER — ATORVASTATIN 10 MG TABLET
10.0000 mg | ORAL_TABLET | Freq: Every evening | ORAL | Status: DC
Start: 2018-09-04 — End: 2018-09-25
  Administered 2018-09-04 – 2018-09-24 (×21): 10 mg via ORAL
  Filled 2018-09-04 (×24): qty 1

## 2018-09-04 MED ORDER — HYDRALAZINE 10 MG TABLET
10.0000 mg | ORAL_TABLET | Freq: Three times a day (TID) | ORAL | Status: DC
Start: 2018-09-04 — End: 2018-09-25
  Administered 2018-09-04 – 2018-09-20 (×49): 10 mg via ORAL
  Administered 2018-09-21: 0 mg via ORAL
  Administered 2018-09-21 – 2018-09-25 (×12): 10 mg via ORAL
  Filled 2018-09-04 (×68): qty 1

## 2018-09-04 MED ORDER — PANTOPRAZOLE 40 MG TABLET,DELAYED RELEASE
40.0000 mg | DELAYED_RELEASE_TABLET | Freq: Every morning | ORAL | Status: DC
Start: 2018-09-05 — End: 2018-09-25
  Administered 2018-09-05 – 2018-09-25 (×21): 40 mg via ORAL
  Filled 2018-09-04 (×23): qty 1

## 2018-09-04 MED ORDER — DEXTROSE 50 % IN WATER (D50W) INTRAVENOUS SYRINGE
25.0000 g | INJECTION | INTRAVENOUS | Status: DC | PRN
Start: 2018-09-04 — End: 2018-09-25
  Administered 2018-09-21 (×2): 50 mL via INTRAVENOUS
  Filled 2018-09-04 (×2): qty 50

## 2018-09-04 MED ORDER — INSULIN LISPRO 100 UNIT/ML SUBCUTANEOUS SSIP - ~~LOC~~/GRMC
1.0000 [IU] | Freq: Four times a day (QID) | SUBCUTANEOUS | Status: DC
Start: 2018-09-04 — End: 2018-09-21
  Administered 2018-09-04: 2 [IU] via SUBCUTANEOUS
  Administered 2018-09-04 – 2018-09-05 (×2): 4 [IU] via SUBCUTANEOUS
  Administered 2018-09-05: 2 [IU] via SUBCUTANEOUS
  Administered 2018-09-05: 4 [IU] via SUBCUTANEOUS
  Administered 2018-09-05: 0 [IU] via SUBCUTANEOUS
  Administered 2018-09-06: 2 [IU] via SUBCUTANEOUS
  Administered 2018-09-06: 4 [IU] via SUBCUTANEOUS
  Administered 2018-09-06: 07:00:00 0 [IU] via SUBCUTANEOUS
  Administered 2018-09-06: 4 [IU] via SUBCUTANEOUS
  Administered 2018-09-07: 1 [IU] via SUBCUTANEOUS
  Administered 2018-09-07 (×2): 4 [IU] via SUBCUTANEOUS
  Administered 2018-09-07: 0 [IU] via SUBCUTANEOUS
  Administered 2018-09-08: 4 [IU] via SUBCUTANEOUS
  Administered 2018-09-08: 0 [IU] via SUBCUTANEOUS
  Administered 2018-09-08 (×2): 4 [IU] via SUBCUTANEOUS
  Administered 2018-09-09: 0 [IU] via SUBCUTANEOUS
  Administered 2018-09-09 (×2): 2 [IU] via SUBCUTANEOUS
  Administered 2018-09-09: 6 [IU] via SUBCUTANEOUS
  Administered 2018-09-10: 0 [IU] via SUBCUTANEOUS
  Administered 2018-09-10: 2 [IU] via SUBCUTANEOUS
  Administered 2018-09-10: 1 [IU] via SUBCUTANEOUS
  Administered 2018-09-10: 2 [IU] via SUBCUTANEOUS
  Administered 2018-09-11: 0 [IU] via SUBCUTANEOUS
  Administered 2018-09-11: 2 [IU] via SUBCUTANEOUS
  Administered 2018-09-11: 6 [IU] via SUBCUTANEOUS
  Administered 2018-09-11: 2 [IU] via SUBCUTANEOUS
  Administered 2018-09-12: 0 [IU] via SUBCUTANEOUS
  Administered 2018-09-12: 2 [IU] via SUBCUTANEOUS
  Administered 2018-09-12: 4 [IU] via SUBCUTANEOUS
  Administered 2018-09-12: 0 [IU] via SUBCUTANEOUS
  Administered 2018-09-13: 4 [IU] via SUBCUTANEOUS
  Administered 2018-09-13: 0 [IU] via SUBCUTANEOUS
  Administered 2018-09-13: 1 [IU] via SUBCUTANEOUS
  Administered 2018-09-13: 07:00:00 0 [IU] via SUBCUTANEOUS
  Administered 2018-09-14: 2 [IU] via SUBCUTANEOUS
  Administered 2018-09-14 (×2): 0 [IU] via SUBCUTANEOUS
  Administered 2018-09-14: 6 [IU] via SUBCUTANEOUS
  Administered 2018-09-15: 4 [IU] via SUBCUTANEOUS
  Administered 2018-09-15: 6 [IU] via SUBCUTANEOUS
  Administered 2018-09-15: 4 [IU] via SUBCUTANEOUS
  Administered 2018-09-15: 0 [IU] via SUBCUTANEOUS
  Administered 2018-09-16: 6 [IU] via SUBCUTANEOUS
  Administered 2018-09-16 (×2): 0 [IU] via SUBCUTANEOUS
  Administered 2018-09-16: 6 [IU] via SUBCUTANEOUS
  Administered 2018-09-17: 1 [IU] via SUBCUTANEOUS
  Administered 2018-09-17: 4 [IU] via SUBCUTANEOUS
  Administered 2018-09-17: 8 [IU] via SUBCUTANEOUS
  Administered 2018-09-17: 0 [IU] via SUBCUTANEOUS
  Administered 2018-09-18 (×2): 4 [IU] via SUBCUTANEOUS
  Administered 2018-09-18: 10 [IU] via SUBCUTANEOUS
  Administered 2018-09-19: 2 [IU] via SUBCUTANEOUS
  Administered 2018-09-19 (×2): 0 [IU] via SUBCUTANEOUS
  Administered 2018-09-19: 6 [IU] via SUBCUTANEOUS
  Administered 2018-09-20: 4 [IU] via SUBCUTANEOUS
  Administered 2018-09-20: 6 [IU] via SUBCUTANEOUS
  Administered 2018-09-20: 1 [IU] via SUBCUTANEOUS
  Administered 2018-09-20 – 2018-09-21 (×2): 0 [IU] via SUBCUTANEOUS
  Administered 2018-09-21: 8 [IU] via SUBCUTANEOUS
  Administered 2018-09-21: 0 [IU] via SUBCUTANEOUS
  Filled 2018-09-04 (×2): qty 300

## 2018-09-04 MED ORDER — SPIRONOLACTONE 50 MG TABLET
50.0000 mg | ORAL_TABLET | Freq: Every morning | ORAL | Status: DC
Start: 2018-09-05 — End: 2018-09-25
  Administered 2018-09-05 – 2018-09-25 (×21): 50 mg via ORAL
  Filled 2018-09-04 (×22): qty 1

## 2018-09-04 MED ORDER — NYSTATIN 100,000 UNIT/GRAM TOPICAL POWDER
Freq: Three times a day (TID) | CUTANEOUS | Status: DC
Start: 2018-09-04 — End: 2018-09-25
  Administered 2018-09-05 – 2018-09-21 (×7): 1 via TOPICAL
  Administered 2018-09-23: 14:00:00 0 via TOPICAL
  Filled 2018-09-04: qty 15

## 2018-09-04 MED ORDER — APIXABAN 2.5 MG TABLET
2.5000 mg | ORAL_TABLET | Freq: Two times a day (BID) | ORAL | Status: DC
Start: 2018-09-04 — End: 2018-09-14
  Administered 2018-09-04 – 2018-09-13 (×19): 2.5 mg via ORAL
  Filled 2018-09-04 (×21): qty 1

## 2018-09-04 MED ORDER — METOPROLOL SUCCINATE ER 25 MG TABLET,EXTENDED RELEASE 24 HR
25.0000 mg | ORAL_TABLET | Freq: Every day | ORAL | Status: DC
Start: 2018-09-05 — End: 2018-09-25
  Administered 2018-09-05 – 2018-09-25 (×21): 25 mg via ORAL
  Filled 2018-09-04 (×24): qty 1

## 2018-09-04 MED ORDER — SODIUM CHLORIDE 0.9 % (FLUSH) INJECTION SYRINGE
3.0000 mL | INJECTION | Freq: Three times a day (TID) | INTRAMUSCULAR | Status: DC
Start: 2018-09-04 — End: 2018-09-14
  Administered 2018-09-04 – 2018-09-05 (×5): 3 mL
  Administered 2018-09-06: 10 mL
  Administered 2018-09-06: 0 mL
  Administered 2018-09-06 – 2018-09-07 (×2): 3 mL
  Administered 2018-09-07: 0 mL
  Administered 2018-09-07 – 2018-09-08 (×4): 3 mL
  Administered 2018-09-09: 10 mL
  Administered 2018-09-09: 3 mL
  Administered 2018-09-09 – 2018-09-10 (×2): 10 mL
  Administered 2018-09-10: 3 mL
  Administered 2018-09-10: 10 mL
  Administered 2018-09-11 (×2): 3 mL
  Administered 2018-09-11: 10 mL
  Administered 2018-09-12 (×3): 3 mL
  Administered 2018-09-13 (×2): 10 mL
  Administered 2018-09-13: 3 mL
  Administered 2018-09-14: 10 mL

## 2018-09-04 MED ORDER — ONDANSETRON HCL (PF) 4 MG/2 ML INJECTION SOLUTION
4.0000 mg | Freq: Four times a day (QID) | INTRAMUSCULAR | Status: DC | PRN
Start: 2018-09-04 — End: 2018-09-25
  Administered 2018-09-05 – 2018-09-21 (×5): 4 mg via INTRAVENOUS
  Filled 2018-09-04 (×5): qty 2

## 2018-09-04 MED ORDER — TUBERCULIN PPD 5 TUB. UNIT/0.1 ML INTRADERMAL INJECTION SOLUTION
0.1000 mL | Freq: Once | INTRADERMAL | Status: AC
Start: 2018-09-05 — End: 2018-09-07
  Administered 2018-09-05 – 2018-09-07 (×2): 0.1 mL via INTRADERMAL
  Filled 2018-09-04: qty 0.1

## 2018-09-04 MED ORDER — DIPHENHYDRAMINE 25 MG CAPSULE
25.0000 mg | ORAL_CAPSULE | Freq: Every evening | ORAL | Status: DC | PRN
Start: 2018-09-04 — End: 2018-09-25
  Administered 2018-09-05 – 2018-09-21 (×6): 25 mg via ORAL
  Filled 2018-09-04 (×6): qty 1

## 2018-09-04 MED ORDER — ASPIRIN 81 MG TABLET,DELAYED RELEASE
81.0000 mg | DELAYED_RELEASE_TABLET | Freq: Every day | ORAL | Status: DC
Start: 2018-09-05 — End: 2018-09-25
  Administered 2018-09-05 – 2018-09-25 (×21): 81 mg via ORAL
  Filled 2018-09-04 (×22): qty 1

## 2018-09-04 MED ORDER — ALBUTEROL SULFATE 2.5 MG/3 ML (0.083 %) SOLUTION FOR NEBULIZATION
2.5000 mg | INHALATION_SOLUTION | RESPIRATORY_TRACT | Status: DC | PRN
Start: 2018-09-04 — End: 2018-09-25
  Administered 2018-09-18 – 2018-09-23 (×7): 2.5 mg via RESPIRATORY_TRACT
  Filled 2018-09-04 (×2): qty 3

## 2018-09-04 MED ORDER — HYDROCORTISONE 1 %-PRAMOXINE 1 % RECTAL FOAM
1.0000 | Freq: Four times a day (QID) | RECTAL | Status: DC
Start: 2018-09-04 — End: 2018-09-25
  Administered 2018-09-04 – 2018-09-06 (×9): 1 via RECTAL
  Administered 2018-09-07: 0 via RECTAL
  Administered 2018-09-07 (×2): 1 via RECTAL
  Administered 2018-09-07: 0 via RECTAL
  Administered 2018-09-08 (×3): 1 via RECTAL
  Administered 2018-09-08: 0 via RECTAL
  Administered 2018-09-09: 1 via RECTAL
  Administered 2018-09-09: 0 via RECTAL
  Administered 2018-09-09: 1 via RECTAL
  Administered 2018-09-09: 0 via RECTAL
  Administered 2018-09-09: 1 via RECTAL
  Administered 2018-09-10 (×3): 0 via RECTAL
  Administered 2018-09-10: 1 via RECTAL
  Administered 2018-09-11 (×2): 0 via RECTAL
  Administered 2018-09-11: 1 via RECTAL
  Administered 2018-09-11: 0 via RECTAL
  Administered 2018-09-12: 1 via RECTAL
  Administered 2018-09-12: 12:00:00 0 via RECTAL
  Administered 2018-09-12 (×2): 1 via RECTAL
  Administered 2018-09-13: 0 via RECTAL
  Administered 2018-09-13 – 2018-09-14 (×5): 1 via RECTAL
  Administered 2018-09-14 (×2): 0 via RECTAL
  Administered 2018-09-14: 1 via RECTAL
  Administered 2018-09-15: 0 via RECTAL
  Administered 2018-09-15 – 2018-09-16 (×4): 1 via RECTAL
  Administered 2018-09-16 (×2): 0 via RECTAL
  Administered 2018-09-17: 1 via RECTAL
  Administered 2018-09-17 (×2): 0 via RECTAL
  Administered 2018-09-17: 14:00:00 1 via RECTAL
  Administered 2018-09-18: 0 via RECTAL
  Administered 2018-09-18: 1 via RECTAL
  Administered 2018-09-18: 0 via RECTAL
  Administered 2018-09-18 – 2018-09-19 (×3): 1 via RECTAL
  Administered 2018-09-19 – 2018-09-20 (×3): 0 via RECTAL
  Administered 2018-09-20 (×3): 1 via RECTAL
  Administered 2018-09-21: 12:00:00 0 via RECTAL
  Administered 2018-09-21 (×2): 1 via RECTAL
  Administered 2018-09-21 – 2018-09-22 (×2): 0 via RECTAL
  Administered 2018-09-22: 06:00:00 1 via RECTAL
  Administered 2018-09-22: 0 via RECTAL
  Administered 2018-09-22: 1 via RECTAL
  Administered 2018-09-23 – 2018-09-24 (×6): 0 via RECTAL
  Administered 2018-09-24: 1 via RECTAL
  Administered 2018-09-24: 0 via RECTAL
  Administered 2018-09-25: 1 via RECTAL
  Administered 2018-09-25 (×2): 0 via RECTAL
  Filled 2018-09-04 (×8): qty 10

## 2018-09-04 MED ORDER — SODIUM CHLORIDE 0.9 % (FLUSH) INJECTION SYRINGE
3.0000 mL | INJECTION | INTRAMUSCULAR | Status: DC | PRN
Start: 2018-09-04 — End: 2018-09-14

## 2018-09-04 MED ORDER — FLU VACC 2019-20(65YR UP)-MF59C(PF) 45 MCG(15 MCGX3)/0.5 ML IM SYRINGE
0.5000 mL | INJECTION | Freq: Every day | INTRAMUSCULAR | Status: DC
Start: 2018-09-04 — End: 2018-09-17
  Administered 2018-09-04: 0.5 mL via INTRAMUSCULAR
  Administered 2018-09-05 – 2018-09-17 (×12): 0 mL via INTRAMUSCULAR
  Filled 2018-09-04 (×17): qty 0.5

## 2018-09-04 MED ORDER — FUROSEMIDE 40 MG TABLET
40.0000 mg | ORAL_TABLET | Freq: Every day | ORAL | Status: DC
Start: 2018-09-05 — End: 2018-09-25
  Administered 2018-09-05 – 2018-09-25 (×21): 40 mg via ORAL
  Filled 2018-09-04 (×22): qty 1

## 2018-09-04 MED ORDER — ACETAMINOPHEN 325 MG TABLET
650.0000 mg | ORAL_TABLET | Freq: Four times a day (QID) | ORAL | Status: DC | PRN
Start: 2018-09-04 — End: 2018-09-25
  Administered 2018-09-16: 650 mg via ORAL
  Filled 2018-09-04: qty 2

## 2018-09-04 MED ADMIN — citalopram 20 mg tablet: ORAL | @ 09:00:00

## 2018-09-04 MED ADMIN — sodium chloride 0.9 % (flush) injection syringe: @ 14:00:00

## 2018-09-04 MED ADMIN — sodium chloride 0.9 % (flush) injection syringe: @ 21:00:00

## 2018-09-04 MED ADMIN — albumin, human 5 % intravenous solution: ORAL | @ 06:00:00 | NDC 00944049101

## 2018-09-04 MED ADMIN — flu vaccine qs2014-15(36mos,up)(PF)60 mcg(15 mcg x4)/0.5 mL IM syringe: ORAL | @ 22:00:00

## 2018-09-04 NOTE — PT Treatment (Signed)
[facility]  Physical Therapy Treatment Note    Patient Name: [name]  Date of Birth: 1946/09/28  Height:  152.4 cm (5')  Weight:  76.9 kg (169 lb 9.6 oz)  Room/Bed: 5121/A  Payor: MEDICARE / Plan: MEDICARE PART A AND B / Product Type: Medicare /     Date/Time of Admission: 09/04/2018  2:15 PM  Admitting Diagnosis:  Anasarca [R60.1]    The patient was seen this session for 30 total billable minutes.  The following procedures were performed this session  Therapeutic Activity, ea 15 min (16109) and Gait Training, ea 15 min [phone]).      Subjective:     Patient with c/o no pain, just c/o being 'tired' and 'SOB' with activity.      Objective:   Pt sitting in bedside chair, 2L02 donned during session.  PT reviewed PT POC, tx goals, d/c plans.  Pt transfers STS to rollator walker with SB/CGA; amb 50' x 2 with rollator walker - demos continuous step through gait with decreased stride and cadence, wide BOS.  SPO2 on RA following gait 100% -- RT notified and gave permission for pt to take off O2 per her tolerance.  Upon return to bedside, pt requests PT to change brief.  Pt dep for hygiene of urine and doffing/donning brief.  Pt transfers into bed with min A.  Call bell in reach.     Assessment:   Pt limited by deconditioning weakness however able to ambulate on RA without O2 desaturation.    Plan:   Continue to follow according to established plan of care.    The risks/benefits of therapy have been discussed with the patient/caregiver and he/she is in agreement with the established plan of care.     Therapist:   Creta Levin, PT,  09/04/2018  Treatment Time In: 310p  Treatment Time Out: 098J  Time may include review of medical chart, obtaining patient's functional history from patient/family/medical staff/case management/ancillary personnel, collaboration on findings and treatment options (with the above mentioned individuals), re-assessment, and acute care rehabilitation.

## 2018-09-04 NOTE — Discharge Summary (Addendum)
[facility]      PATIENT NAME:  [name], [name]  MRN:  [id number]  DOB:  1946/01/16      ENCOUNTER START DATE: 08/25/2018  INPATIENT ADMISSION DATE: 08/25/2018    DISCHARGE DATE:  09/04/2018    ATTENDING PHYSICIAN: Vedia Pereyra, MD    PRIMARY CARE PHYSICIAN: Ignacia Felling, MD     ADMISSION DIAGNOSIS: Anasarca      DISCHARGE DIAGNOSIS:   Hospital Problems) (* Primary Problem)    Diagnosis Date Noted   . 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 PE (pulmonary thromboembolism) (CMS HCC) 03/29/2018   . Hypokalemia 11/23/2017   . Open abdominal wall wound 11/22/2017   . Pancreatic cancer (CMS Agency) 10/25/2017   . Type 2 diabetes mellitus (CMS [place])  Hyponatremia-multifactorial 01/24/2017   . Asthma  Physical deconditioning  Hypertension  Mild metabolic encephalopathy-resolved  Urinary tract infection-treated  NSTEMI type 2  Anemia of chronic disease  Chronic diastolic CHF 54/08/8118      Resolved Hospital Problems   No resolved problems to display.     There are no active non-hospital problems to display for this patient.       DISCHARGE MEDICATIONS:     Current Discharge Medication List      CONTINUE these medications - NO CHANGES were made during your visit.      Details   aspirin 81 mg Tablet, Delayed Release (E.C.)  Commonly known as:  ECOTRIN   81 mg, Oral  Refills:  0     atorvastatin 10 mg Tablet  Commonly known as:  LIPITOR   10 mg, Oral  Refills:  0     cetirizine 10 mg Tablet  Commonly known as:  ZYRTEC   10 mg, Oral  Refills:  0     citalopram 20 mg Tablet  Commonly known as:  CELEXA   20 mg, Oral  Refills:  0     furosemide 20 mg Tablet  Commonly known as:  LASIX   20 mg, DAILY  Refills:  0     gabapentin 300 mg Capsule  Commonly known as:  NEURONTIN   300 mg, DAILY  Refills:  0     glipiZIDE 10 mg Tablet Extended Rel 24 hr (2)  Commonly known as:  GLUCOTROL XL   10 mg, Oral  Refills:  0     hydroCHLOROthiazide 25 mg  Tablet  Commonly known as:  HYDRODIURIL   25 mg, DAILY  Refills:  0     HYDROcodone-acetaminophen 5-325 mg Tablet  Commonly known as:  NORCO   1 Tab, Oral, EVERY 4 HOURS PRN  Refills:  0     lidocaine-prilocaine 2.5-2.5 % Cream  Commonly known as:  EMLA   No dose, route, or frequency recorded.  Refills:  0     LORazepam 0.5 mg Tablet  Commonly known as:  ATIVAN   0.5 mg, DAILY  Refills:  0     losartan 100 mg Tablet  Commonly known as:  COZAAR   100 mg, Oral, DAILY  Refills:  0     MetFORMIN 1,000 mg Tablet  Commonly known as:  GLUCOPHAGE   1,000 mg, 2 TIMES DAILY WITH FOOD  Refills:  0     metoprolol succinate 50 mg Tablet Sustained Release 24 hr  Commonly known as:  TOPROL-XL   50 mg, DAILY  Refills:  0     montelukast 10  mg Tablet  Commonly known as:  SINGULAIR   10 mg, Oral  Refills:  0     omeprazole 40 mg Capsule, Delayed Release(E.C.)  Commonly known as:  PRILOSEC   40 mg, Oral, DAILY  Qty:  90 Cap  Refills:  4     potassium chloride 20 mEq Tab Sust.Rel. Particle/Crystal  Commonly known as:  K-DUR   20 mEq, Oral  Refills:  0     prochlorperazine 10 mg Tablet  Commonly known as:  COMPAZINE   10 mg, Oral  Refills:  0     promethazine 25 mg Tablet  Commonly known as:  PHENERGAN   25 mg, Oral  Refills:  0     ZOFRAN 8 mg Tablet  Generic drug:  ondansetron   8 mg, Oral, EVERY 8 HOURS PRN  Refills:  0            DISCHARGE INSTRUCTIONS:   No discharge procedures on file.      REASON FOR HOSPITALIZATION AND HOSPITAL COURSE:  This is a 72 y.o., female that was admitted into the hospital with anasarca.  The patient does have a history of pancreatic cancer and pulmonary embolisms.  The patient has undergone a Whipple procedure and has had radiation and chemotherapy prior to coming in.  Patient has been having worsening swelling.  The patient was diuresed with good success through the beginning portions of this admission.  She did unfortunately became rather hyponatremic.  Patient was seen and evaluated by Nephrology as  well.  They are following.  Sodium levels are improving somewhat.  She is seemingly less symptomatic.  She is also chronically anemic.  Patient is going [facility] today and will be monitored by Nephrology in the hospitalist service.  Sodium levels to be monitored at skilled nursing unit.       Filed Vitals:    09/03/18 1900 09/04/18 0300 09/04/18 0400 09/04/18 0825   BP: (!) 128/54 (!) 122/49 (!) 122/49 (!) 133/57   Pulse: 82 73 73 76   Resp: 18 16 16 16    Temp: 37.1 C (98.8 F) 36.6 C (97.9 F) 36.6 C (97.9 F) 36.9 C (98.4 F)   SpO2: 100% 100% 100% 100%       Physical Exam:  General:  In bed, alert, oriented, cooperative, appears chronically ill, O2 via nasal cannula, pale, no acute distress  Cardiac:  Regular rate  Respiratory:  Moderate to good air entry, clear to auscultation bilaterally  Abdomen: Positive bowel sounds, soft, nontender  Extremities:  Trace to +1 pitting edema, much improved  Skin:  Warm and dry, pale    SIGNIFICANT LAB:    Results for orders placed or performed during the hospital encounter of 08/25/18 (from the past 24 hour(s))   TROPONIN-I   Result Value Ref Range    TROPONIN I 0.01 <=0.04 ng/mL   URINALYSIS WITH REFLEX MICROSCOPIC AND CULTURE IF POSITIVE    Narrative    The following orders were created for panel order URINALYSIS WITH REFLEX MICROSCOPIC AND CULTURE IF POSITIVE.  Procedure                               Abnormality         Status                     ---------                               -----------         ------  URINALYSIS, MACRO/MICRO[[id number]]      Abnormal            Final result                 Please view results for these tests on the individual orders.   URINALYSIS, MACRO/MICRO   Result Value Ref Range    COLOR Yellow Yellow, Straw, Colorless    APPEARANCE Clear Clear, Cloudy    SPECIFIC GRAVITY 1.011 1.010 - 1.025    PH 7.5 (H) 5.0 - 7.0    LEUKOCYTES Trace (A) Negative WBCs/uL    NITRITE Negative Negative    PROTEIN Negative Negative, Trace  mg/dL    GLUCOSE Negative Negative mg/dL    KETONES Negative Negative mg/dL    UROBILINOGEN 0.2  Normal, 0.2 , 1.0 mg/dL    BILIRUBIN Negative Negative mg/dL    BLOOD Negative Negative mg/dL    RBCS 0-2 (A) None /hpf    WBCS 0-5 (A) None /hpf    BACTERIA None None /hpf    SQUAMOUS EPITHELIAL Rare None, Rare, Occasional /hpf    HYALINE CASTS Occasional None, Rare, Few, Occasional, Many /lpf   CREATININE URINE, RANDOM   Result Value Ref Range    CREATININE RANDOM URINE 31 No known normals mg/dL   SODIUM, RANDOM URINE   Result Value Ref Range    SODIUM RANDOM URINE 95 No known normals mmol/L   UREA NITROGEN, RANDOM URINE   Result Value Ref Range    UREA NITROGEN RANDOM URINE 117 mg/dL   OSMOLALITY, RANDOM URINE   Result Value Ref Range    OSMOLALITY URINE 311 250 - 900 mOsm/kg   SODIUM   Result Value Ref Range    SODIUM 123 (L) 135 - 145 mmol/L   SODIUM   Result Value Ref Range    SODIUM 122 (L) 135 - 145 mmol/L   SODIUM   Result Value Ref Range    SODIUM 124 (L) 135 - 145 mmol/L   BASIC METABOLIC PANEL, FASTING   Result Value Ref Range    SODIUM 126 (L) 135 - 145 mmol/L    POTASSIUM 4.2 3.5 - 5.0 mmol/L    CHLORIDE 98 98 - 111 mmol/L    CO2 TOTAL 28 21 - 35 mmol/L    ANION GAP 0 mmol/L    CALCIUM 7.5 (L) 8.8 - 10.3 mg/dL    GLUCOSE 61 (L) 70 - 110 mg/dL    BUN 8 (L) 10 - 25 mg/dL    CREATININE 0.72 <=1.30 mg/dL    BUN/CREA RATIO 11     ESTIMATED GFR >60 Avg: 75 mL/min/1.1m^2   CBC/DIFF    Narrative    The following orders were created for panel order CBC/DIFF.  Procedure                               Abnormality         Status                     ---------                               -----------         ------                     CBC WITH GEXB[[id number]]  Abnormal            Final result                 Please view results for these tests on the individual orders.   THYROID STIMULATING HORMONE (SENSITIVE TSH)   Result Value Ref Range    TSH 2.900 0.450 - 5.330 uIU/mL   CBC WITH DIFF   Result Value Ref  Range    WBC 3.2 (L) 3.5 - 10.3 x10^3/uL    RBC 2.36 (L) 3.80 - 5.24 x10^6/uL    HGB 7.5 (L) 11.8 - 15.8 g/dL    HCT 21.7 (L) 34.6 - 46.2 %    MCV 92.2 82.3 - 96.7 fL    MCH 31.6 27.6 - 33.2 pg    MCHC 34.3 32.6 - 35.4 g/dL    RDW 14.2 12.4 - 15.2 %    PLATELETS 149 140 - 440 x10^3/uL    MPV 10.1 6.6 - 10.2 fL    NEUTROPHIL % 51 %    LYMPHOCYTE % 26 %    MONOCYTE % 15 %    EOSINOPHIL % 5 %    BASOPHIL % 3 %    NEUTROPHIL # 1.70 1.50 - 6.40 x10^3/uL    LYMPHOCYTE # 0.80 (L) 0.90 - 3.40 x10^3/uL    MONOCYTE # 0.50 0.20 - 0.90 x10^3/uL    EOSINOPHIL # 0.20 0.00 - 0.50 x10^3/uL    BASOPHIL # 0.10 0.00 - 0.20 x10^3/uL   CORTISOL, SERUM   Result Value Ref Range    CORTISOL 10.3 ug/dL   POC BLOOD GLUCOSE (RESULTS)   Result Value Ref Range    GLUCOSE, POC 103 70 - 110 mg/dl   POC BLOOD GLUCOSE (RESULTS)   Result Value Ref Range    GLUCOSE, POC 286 (H) 70 - 110 mg/dl   POC BLOOD GLUCOSE (RESULTS)   Result Value Ref Range    GLUCOSE, POC 54 (L) 70 - 110 mg/dl   POC BLOOD GLUCOSE (RESULTS)   Result Value Ref Range    GLUCOSE, POC 118 (H) 70 - 110 mg/dl       SIGNIFICANT RADIOLOGY:   Results for orders placed or performed during the hospital encounter of 08/25/18   XR AP MOBILE CHEST     Status: None    Narrative    Kasey Lutterman    PROCEDURE DESCRIPTION: XR AP MOBILE CHEST    PROCEDURE PERFORMED DATE AND TIME: 08/25/2018 6:48 PM    CLINICAL INDICATION: SOB r/o EDEMA    TECHNIQUE: 1 views / 2 images submitted.    COMPARISON: Chest x-ray dated October 18, 2017      FINDINGS: The heart size is upper limits of normal. There is a right-sided  infusion port catheter. There is no infiltrate or pleural fluid. There are  degenerative changes of the spine and shoulders.      Impression    No acute process        Radiologist location ID: [id number]     [place] RT UPPER QUADRANT     Status: None    Narrative    Norleen Hartin    [place] RT UPPER QUADRANT performed on 08/26/2018 8:40 AM    INDICATION: 72 years old Female  Pancreatic  cancer-elevated LFT status post Whipple procedure.  Please  evaluate pancreas, kidney and liver with portal when  flow.    TECHNIQUE: Transabdominal ultrasound of the right upper quadrant.    COMPARISON: Ultrasound 08/02/2018.    FINDINGS:  Overall the pancreas is not well evaluated by ultrasound. Pancreas is  incompletely visualized. The included distal body/tail appears atrophic but  otherwise grossly unremarkable. In the location of the pancreatic head with  tip to be located (the patient has a reported history of Whipple  procedure), there is a heterogeneously hypoechoic structure which appears  similar to that of the adjacent liver. This may be reflective of liver  tissue, but mass is not excluded.    The liver is mildly enlarged and demonstrates coarsening of its echotexture  along with slight nodularity along the liver contour. Findings suggest a  degree of underlying fibrosis. Only a segment of the portal vein is  visualized, which demonstrates proper direction of blood flow.    Status post cholecystectomy. Included segment of the proximal common bile  duct is nondilated.    The right kidney measures 10 x 4.6 cm.  There is no evidence  hydronephrosis.    There is a small volume of free perihepatic fluid.    Right pleural effusion.        Impression    1. Pancreas is not well evaluated by ultrasound. In the usual location of  the pancreatic head (which is absent due to Whipple procedure) there is  heterogeneous soft tissue and may be associated with the liver but mass  cannot be excluded by ultrasound. Further evaluation with contrast-enhanced  CT would be required to further evaluate this.  2. Mild hepatomegaly. Slight nodularity along liver edge and slight  coarsening of the hepatic echotexture suggests a degree of underlying  fibrosis.  3. Small volume of right upper quadrant ascites.  4. Right pleural effusion.        Radiologist location ID: [id number]     XR AP MOBILE CHEST     Status: None    Narrative     Arbadella Macqueen    PROCEDURE DESCRIPTION: XR AP MOBILE CHEST.  PROCEDURE PERFORMED DATE AND TIME: 09/03/2018 1:57 AM.  CLINICAL INDICATION: dyspnea.  TECHNIQUE: 1 view(s) / 1 image(s) submitted.  COMPARISON: 08/25/2018.    FINDINGS:  The RIGHT Port-A-Cath has a stable configuration.  No pneumothorax.  No pleural effusion.  No focal infiltrate.  Heart size is within normal range.  No acute bony abnormality is appreciated.  No significant change is identified.      Impression    1. No acute cardiopulmonary process is appreciated.     REP        Radiologist location ID: [id number]         CONSULTATIONS:  Cardiology and Nephrology  PROCEDURES PERFORMED:  None    DOES PATIENT HAVE ADVANCED DIRECTIVES:  Yes, Patient Does Have Advance Directive for Healthcare Treatment    CONDITION ON DISCHARGE: Alert, Oriented and VS Stable    DISCHARGE DISPOSITION:  Skilled Nursing Unit    Copies sent to Care Team       Relationship Specialty Notifications Start End    Ignacia Felling, MD PCP - General EXTERNAL  03/30/18     Phone: [phone] Fax: [phone]         [place] [address] [address]          Vedia Pereyra, MD  09/04/2018, 10:25

## 2018-09-04 NOTE — Nurses Notes (Signed)
RN prepared interm care plan and delivered to patient.  Patient states no questions or concerns at this time.

## 2018-09-04 NOTE — Care Plan (Signed)
patient discharged to skilled nursing unit. patient alert/oriented with unsteady gait. discharge instructions reviewed with patient. no acute events during this shift. IV removed.       Problem: Fall Injury Risk  Goal: Absence of Fall and Fall-Related Injury  Outcome: Outcome Achieved     Problem: Fluid Volume Excess  Goal: Fluid Balance  Outcome: Outcome Achieved     Problem: Pain (Acute Coronary Syndrome)  Goal: Absence of Cardiac-Related Pain  Outcome: Outcome Achieved     Problem: Hemodynamic Instability (Acute Coronary Syndrome)  Goal: Effective Cardiac Pump Function  Outcome: Outcome Achieved     Problem: Tissue Perfusion (Acute Coronary Syndrome)  Goal: Adequate Tissue Perfusion  Outcome: Outcome Achieved     Problem: Diabetes Comorbidity  Goal: Blood Glucose Level Within Desired Range  Outcome: Outcome Achieved     Problem: Skin Injury Risk Increased  Goal: Skin Health and Integrity  Outcome: Outcome Achieved     Problem: Acute Rehab Services Goal & Intervention Plan  Goal: Physical Therapy Goal  Description  Stand Alone Therapy Goal  Outcome: Outcome Achieved

## 2018-09-04 NOTE — Care Plan (Signed)
LTC Physical Therapy Evaluation  Rehabilitation Services          Patient Name: [name]  Date of Birth: 05-Apr-1946  Height: Height: 152.4 cm (5')  Weight: Weight: 76.9 kg (169 lb 9.6 oz)  Room/Bed: 5121/A  Payor: Payor: MEDICARE / Plan: MEDICARE PART A AND B / Product Type: Medicare /     Admission Diagnosis: Anasarca [R60.1]      Assessment:      Pt presents with impaired functional mobility s/p H for anasarca d/t CHF and liver cirrhosis.  She is limited by deconditioning weakness with deficits in gait, transfers, endurance and strength.  She is currently dep on min A for transfers into/oob and CG/SBA for sit <--> stand and limited gait with personal rollator walker.  PLOF pt lives with her sister in level home with ramp entrance, indep ADL and gait with rollator walker.  Pt will benefit from skilled rehab for strengthening and mobility training to facilitate recovery of indep household mobility and gait at a rollator walker level.  Est 2 weeks LOS or less.         Plan:     Functional transfer training, LE strengthening/ROM, Endurance training, Bed mobility, Gait training  5x/wk  until discharge       Evaluation:        09/04/18 1510   Therapist Pager   PT Assigned/ Pager # Caryl Pina PT [place] Pt presents with impaired functional mobility s/p H for anasarca d/t CHF and liver cirrhosis.  She is limited by deconditioning weakness with deficits in gait, transfers, endurance and strength.  She is currently dep on min A for transfers into/oob and CG/SBA for sit <--> stand and limited gait with personal rollator walker.  PLOF pt lives with her sister in level home with ramp entrance, indep ADL and gait with rollator walker.  Pt will benefit from skilled rehab for strengthening and mobility training to facilitate recovery of indep household mobility and gait at a rollator walker level.  Est 2 weeks LOS or less.    Type of Evaluation Evaluation   Chart Reviewed Yes   Additional Pertinent History  PMH:  pancreatic ca s/p sx, DM, asthma, HTN, UTI, NSTEMI, anemia, CHF, PE, hypokalemia   Precautions   Other Fall Risk;Other  (Monitor O2)   Home Living   Type of [address] One level   Home Access Ramped entrance   Bathroom Shower/Tub   (step in shower)   Hydrologist chair with back;Grab bars in [place] rolling or standard;Cane;Other (Comment)  (rollator)   Prior Function   Level of Independence Independent with ADLs and functional transfers   [place] During Transitions and Walking   Moving From Seated to Standing Position Not steady, only able to stabilize with staff assistance   Walking (With Assistive Device if Used) Not steady, only able to stabilize with staff assistance   Turning Around and Facing Opposite Direction While Walking Not steady, only able to stabilize with staff assistance   Moving On and Off Toilet Not steady, only able to stabilize with staff assistance   Surface-To-Surface Transfer (Transfer Between Bed and Chair or Wheelchair) Not steady, only able to stabilize with staff assistance   Functional Limitation in Range of Motion   Upper Extremity (Shoulder, Elbow, Wrist,  Hand) No impairment   Lower Extremity (Hip, Knee, Ankle, Foot) No impairment   Activity Tolerance   Endurance Tolerates 10 - 20 min exercise with multiple rests   Pain   Pain Assessment Assessment   Pain Scale Used N   Pain Score (Numeric, Faces) 0   Cognition   Orientation Level Oriented X4   Transfers   Transfer Yes   Transfer 1   Transfer From 1 Bed   Transfer Type 1 To and from   Transfer to 1 Sit   Transfer Level of Assistance 1 Minimum assistance   Transfers 2   Transfer From 2 Sit   Transfer Type 2 To and from   Transfer to 2 Stand   Transfer Device 2 rollator walker   Transfer Level of Assistance 2 Contact guard   Ambulation   Ambulation Yes   Ambulation 1      Device 1 Rollator   Assistance 1 Contact guard;Close supervision   Quality of Gait 1 Decreased stride and cadence, wide BOS   Comments/Distance (ft) 1 50' x 2   RUE Assessment   RUE Assessment WFL   LUE Assessment   LUE Assessment WFL   RLE Assessment   RLE Assessment WFL   LLE Assessment   LLE Assessment WFL   Care Plan Goals   PT Rehab Goals Physical Therapy Goal;Bed Mobility Goal;Gait Training Goal;Transfer Training Goal;Stairs Training Goal   Physical Therapy Goal   PT  Goal, Date Established 09/04/18   PT Goal, Time to Achieve by discharge   PT Goal, Activity Type HEP   PT Goal, Independence Level independent   Bed Mobility Goal   Bed Mobility Goal, Date Established 09/04/18   Bed Mobility Goal, Time to Achieve by discharge   Bed Mobility Goal, Activity Type all bed mobility activities   Bed Mobility Goal, Independence Level modified independence   Gait Training  Goal, Distance to Achieve   Gait Training  Goal, Date Established 09/04/18   Gait Training  Goal, Time to Achieve by discharge   Gait Training  Goal, Independence Level modified independence   Gait Training  Goal, Assist Device walker, rolling   Gait Training  Goal, Distance to Achieve 100   Stairs Training Goal   Stairs Training Goal, Date Established 09/04/18   Stairs Training Goal, Time to Achieve by discharge   Stairs Training Goal, Independence Level supervision required   Stairs Training Goal, Assist Device   (rollator walk)   Stairs Training Goal, Number of Stairs to Achieve 1   Transfer Training Goal   Transfer Training Goal, Date Established 09/04/18   Transfer Training Goal, Time to Achieve by discharge   Transfer Training Goal, Activity Type all transfers   Transfer Training Goal, Independence Level modified independence   Transfer Training Goal, Assist Device other (see comments)  (rollator walker)   Plan   Treatment/Interventions Functional transfer training;LE strengthening/ROM;Endurance training;Bed mobility;Gait training   PT  Frequency 5x/wk   Duration  until discharge   Recommendation   Recommendation Short-term skilled PT;Home PT   Equipment Recommended Other (Comment)  (rollator walker)             Certification From:  09/04/2018             To:  12/04/2018      [name], PT  09/04/2018    Agree with plan and management as written.    Vedia Pereyra, MD  09/04/2018, 17:38

## 2018-09-04 NOTE — Care Plan (Signed)
Patient is alert and oriented. Patient is free of falls. Patient denies pain. Patient continues on IV fluids as ordered. Patient remains SR on telemetry. VS stable this shift. Patient is resting in bed, no complaints at this time. Will continue to monitor. Tillie Fantasia, RN  09/04/2018, 05:31

## 2018-09-04 NOTE — Progress Notes (Signed)
[facility]  Hospitalist Progress Note    [name]       72 y.o.       Date of service: 09/04/2018  Date of Admission:  08/25/2018    Hospital Day:  LOS: 10 days   CC: Anasarca    Subjective:    Seen and examined.  She does seem to have any complaints today.  Sodium level does seem to be somewhat improved at 126.  Will closely monitor.  Patient is stable for transfer to [facility] today.  Discharge summary/H&P has been completed.  Total discharge time greater than 30 minutes.    Objective:   Filed Vitals:    09/03/18 1900 09/04/18 0300 09/04/18 0400 09/04/18 0825   BP: (!) 128/54 (!) 122/49 (!) 122/49 (!) 133/57   Pulse: 82 73 73 76   Resp: 18 16 16 16    Temp: 37.1 C (98.8 F) 36.6 C (97.9 F) 36.6 C (97.9 F) 36.9 C (98.4 F)   SpO2: 100% 100% 100% 100%     O2 delivery: Nasal Cannula     I have reviewed the vitals.      Input/Output    Intake/Output Summary (Last 24 hours) at 09/04/2018 1001  Last data filed at 09/04/2018 0800  Gross per 24 hour   Intake 480 ml   Output 50 ml   Net 430 ml           Current Facility-Administered Medications:  acetaminophen (TYLENOL) tablet 650 mg Oral Q6H PRN   albuterol (PROVENTIL) 2.5 mg / 3 mL (0.083%) neb solution 2.5 mg Nebulization Q4H PRN   apixaban (ELIQUIS) tablet 2.5 mg Oral 2x/day   aspirin (ECOTRIN) enteric coated tablet 81 mg 81 mg Oral Daily   atorvastatin (LIPITOR) tablet 10 mg Oral QPM   cefTRIAXone (ROCEPHIN) 2 g in NS 50 mL IVPB 2 g Intravenous Q24H   citalopram (CELEXA) tablet 20 mg Oral Daily   dextrose 50% (0.5 g/mL) injection - syringe 25 g Intravenous Q15 Min PRN   diphenhydrAMINE (BENADRYL) capsule 25 mg Oral HS PRN   furosemide (LASIX) tablet 40 mg Oral Daily   hydrALAZINE (APRESOLINE) tablet 10 mg Oral Q8HRS   HYDROcodone-acetaminophen (NORCO) 5-325 mg per tablet 1 Tab Oral Q4H PRN   hydrocortisone-pramoxine (PROCTOFOAM HC) 1-1% rectal foam 1 Applicator Rectal X7D   losartan (COZAAR) tablet 100 mg Oral Daily   metoprolol succinate (TOPROL-XL)  24 hr extended release tablet 25 mg Oral Daily   NS flush syringe 3 mL Intracatheter Q8HRS   NS flush syringe 3 mL Intracatheter Q1H PRN   NS premix infusion  Intravenous Continuous   nystatin (NYSTOP) 100,000 units/g topical powder  Apply Topically 3x/day   ondansetron (ZOFRAN) 2 mg/mL injection 4 mg Intravenous Q6H PRN   pantoprazole (PROTONIX) delayed release tablet 40 mg Oral Daily before Breakfast   spironolactone (ALDACTONE) tablet 50 mg Oral Daily with Breakfast   SSIP insulin lispro (HUMALOG) 100 units/mL injection 1-12 Units Subcutaneous 4x/day AC       Physical Exam:  General:  In bed, alert, oriented, cooperative, appears chronically ill, O2 via nasal cannula, pale, no acute distress  Cardiac:  Regular rate  Respiratory:  Moderate to good air entry, clear to auscultation bilaterally  Abdomen: Positive bowel sounds, soft, nontender  Extremities:  Trace to +1 pitting edema, much improved  Skin:  Warm and dry, pale    Labs:   Results for orders placed or performed during the hospital encounter of 08/25/18 (from the  past 24 hour(s))   TROPONIN-I   Result Value Ref Range    TROPONIN I 0.01 <=0.04 ng/mL   URINALYSIS WITH REFLEX MICROSCOPIC AND CULTURE IF POSITIVE    Narrative    The following orders were created for panel order URINALYSIS WITH REFLEX MICROSCOPIC AND CULTURE IF POSITIVE.  Procedure                               Abnormality         Status                     ---------                               -----------         ------                     URINALYSIS, MACRO/MICRO[[id number]]      Abnormal            Final result                 Please view results for these tests on the individual orders.   URINALYSIS, MACRO/MICRO   Result Value Ref Range    COLOR Yellow Yellow, Straw, Colorless    APPEARANCE Clear Clear, Cloudy    SPECIFIC GRAVITY 1.011 1.010 - 1.025    PH 7.5 (H) 5.0 - 7.0    LEUKOCYTES Trace (A) Negative WBCs/uL    NITRITE Negative Negative    PROTEIN Negative Negative, Trace mg/dL     GLUCOSE Negative Negative mg/dL    KETONES Negative Negative mg/dL    UROBILINOGEN 0.2  Normal, 0.2 , 1.0 mg/dL    BILIRUBIN Negative Negative mg/dL    BLOOD Negative Negative mg/dL    RBCS 0-2 (A) None /hpf    WBCS 0-5 (A) None /hpf    BACTERIA None None /hpf    SQUAMOUS EPITHELIAL Rare None, Rare, Occasional /hpf    HYALINE CASTS Occasional None, Rare, Few, Occasional, Many /lpf   CREATININE URINE, RANDOM   Result Value Ref Range    CREATININE RANDOM URINE 31 No known normals mg/dL   SODIUM, RANDOM URINE   Result Value Ref Range    SODIUM RANDOM URINE 95 No known normals mmol/L   UREA NITROGEN, RANDOM URINE   Result Value Ref Range    UREA NITROGEN RANDOM URINE 117 mg/dL   OSMOLALITY, RANDOM URINE   Result Value Ref Range    OSMOLALITY URINE 311 250 - 900 mOsm/kg   SODIUM   Result Value Ref Range    SODIUM 123 (L) 135 - 145 mmol/L   SODIUM   Result Value Ref Range    SODIUM 122 (L) 135 - 145 mmol/L   SODIUM   Result Value Ref Range    SODIUM 124 (L) 135 - 145 mmol/L   BASIC METABOLIC PANEL, FASTING   Result Value Ref Range    SODIUM 126 (L) 135 - 145 mmol/L    POTASSIUM 4.2 3.5 - 5.0 mmol/L    CHLORIDE 98 98 - 111 mmol/L    CO2 TOTAL 28 21 - 35 mmol/L    ANION GAP 0 mmol/L    CALCIUM 7.5 (L) 8.8 - 10.3 mg/dL    GLUCOSE 61 (L) 70 - 110 mg/dL    BUN 8 (L) 10 -  25 mg/dL    CREATININE 0.72 <=1.30 mg/dL    BUN/CREA RATIO 11     ESTIMATED GFR >60 Avg: 75 mL/min/1.66m^2   CBC/DIFF    Narrative    The following orders were created for panel order CBC/DIFF.  Procedure                               Abnormality         Status                     ---------                               -----------         ------                     CBC WITH UVOZ[[id number]]                Abnormal            Final result                 Please view results for these tests on the individual orders.   THYROID STIMULATING HORMONE (SENSITIVE TSH)   Result Value Ref Range    TSH 2.900 0.450 - 5.330 uIU/mL   CBC WITH DIFF   Result Value Ref Range     WBC 3.2 (L) 3.5 - 10.3 x10^3/uL    RBC 2.36 (L) 3.80 - 5.24 x10^6/uL    HGB 7.5 (L) 11.8 - 15.8 g/dL    HCT 21.7 (L) 34.6 - 46.2 %    MCV 92.2 82.3 - 96.7 fL    MCH 31.6 27.6 - 33.2 pg    MCHC 34.3 32.6 - 35.4 g/dL    RDW 14.2 12.4 - 15.2 %    PLATELETS 149 140 - 440 x10^3/uL    MPV 10.1 6.6 - 10.2 fL    NEUTROPHIL % 51 %    LYMPHOCYTE % 26 %    MONOCYTE % 15 %    EOSINOPHIL % 5 %    BASOPHIL % 3 %    NEUTROPHIL # 1.70 1.50 - 6.40 x10^3/uL    LYMPHOCYTE # 0.80 (L) 0.90 - 3.40 x10^3/uL    MONOCYTE # 0.50 0.20 - 0.90 x10^3/uL    EOSINOPHIL # 0.20 0.00 - 0.50 x10^3/uL    BASOPHIL # 0.10 0.00 - 0.20 x10^3/uL   CORTISOL, SERUM   Result Value Ref Range    CORTISOL 10.3 ug/dL   POC BLOOD GLUCOSE (RESULTS)   Result Value Ref Range    GLUCOSE, POC 103 70 - 110 mg/dl   POC BLOOD GLUCOSE (RESULTS)   Result Value Ref Range    GLUCOSE, POC 286 (H) 70 - 110 mg/dl   POC BLOOD GLUCOSE (RESULTS)   Result Value Ref Range    GLUCOSE, POC 54 (L) 70 - 110 mg/dl   POC BLOOD GLUCOSE (RESULTS)   Result Value Ref Range    GLUCOSE, POC 118 (H) 70 - 110 mg/dl         I have reviewed all labs.    Micro:   Hospital Encounter on 08/25/18 (from the past 96 hour(s))   ADULT ROUTINE BLOOD CULTURE, SET OF 2 BOTTLES (BACTERIA AND YEAST)    Collection Time: 09/03/18  4:37 AM   Culture Result  Status    BLOOD CULTURE, ROUTINE No Growth 18-24 hrs. Preliminary   ADULT ROUTINE BLOOD CULTURE, SET OF 2 BOTTLES (BACTERIA AND YEAST)    Collection Time: 09/03/18  7:20 AM   Culture Result Status    BLOOD CULTURE, ROUTINE No Growth 18-24 hrs. Preliminary         PT/OT: No    Consults:  Cardiology    Assessment/ Plan:   Active Hospital Problems    Diagnosis   . Primary Problem: Anasarca   . PE (pulmonary thromboembolism) (CMS HCC)   . Hypokalemia   . Open abdominal wall wound   . Pancreatic cancer (CMS [place])   . Type 2 diabetes mellitus (CMS HCC)   . Asthma     DNR    Vedia Pereyra, MD  09/04/2018, 10:02

## 2018-09-04 NOTE — H&P (Signed)
[facility] H&P      PATIENT NAME:  [name], [name]  MRN:  [id number]  DOB:  [birth year]      ENCOUNTER START DATE: 08/25/2018  INPATIENT ADMISSION DATE: 08/25/2018    DISCHARGE DATE:  09/04/2018    ATTENDING PHYSICIAN: Vedia Pereyra, MD    PRIMARY CARE PHYSICIAN: Ignacia Felling, MD     ADMISSION DIAGNOSIS: Anasarca      DISCHARGE DIAGNOSIS:        Hospital Problems) (* Primary Problem)    Diagnosis Date Noted   . *Anasarca likely due to diastolic CHF and probable liver cirrhosis 08/25/2018   . PE (pulmonary thromboembolism) (CMS HCC) 03/29/2018   . Hypokalemia 11/23/2017   . Open abdominal wall wound 11/22/2017   . Pancreatic cancer (CMS [place]) 10/25/2017   . Type 2 diabetes mellitus (CMS [place])  Hyponatremia-multifactorial 01/24/2017   . Asthma  Physical deconditioning  Hypertension  Mild metabolic encephalopathy-resolved  Urinary tract infection-treated  NSTEMI type 2  Anemia of chronic disease 01/24/2017      Resolved Hospital Problems   No resolved problems to display.     There are no active non-hospital problems to display for this patient.       DISCHARGE MEDICATIONS:         Current Discharge Medication List          CONTINUE these medications - NO CHANGES were made during your visit.      Details   aspirin 81 mg Tablet, Delayed Release (E.C.)  Commonly known as:  ECOTRIN   81 mg, Oral  Refills:  0     atorvastatin 10 mg Tablet  Commonly known as:  LIPITOR   10 mg, Oral  Refills:  0     cetirizine 10 mg Tablet  Commonly known as:  ZYRTEC   10 mg, Oral  Refills:  0     citalopram 20 mg Tablet  Commonly known as:  CELEXA   20 mg, Oral  Refills:  0     furosemide 20 mg Tablet  Commonly known as:  LASIX   20 mg, DAILY  Refills:  0     gabapentin 300 mg Capsule  Commonly known as:  NEURONTIN   300 mg, DAILY  Refills:  0     glipiZIDE 10 mg Tablet Extended Rel 24 hr (2)  Commonly known as:  GLUCOTROL XL   10 mg, Oral  Refills:  0     hydroCHLOROthiazide 25  mg Tablet  Commonly known as:  HYDRODIURIL   25 mg, DAILY  Refills:  0     HYDROcodone-acetaminophen 5-325 mg Tablet  Commonly known as:  NORCO   1 Tab, Oral, EVERY 4 HOURS PRN  Refills:  0     lidocaine-prilocaine 2.5-2.5 % Cream  Commonly known as:  EMLA   No dose, route, or frequency recorded.  Refills:  0     LORazepam 0.5 mg Tablet  Commonly known as:  ATIVAN   0.5 mg, DAILY  Refills:  0     losartan 100 mg Tablet  Commonly known as:  COZAAR   100 mg, Oral, DAILY  Refills:  0     MetFORMIN 1,000 mg Tablet  Commonly known as:  GLUCOPHAGE   1,000 mg, 2 TIMES DAILY WITH FOOD  Refills:  0     metoprolol succinate 50 mg Tablet Sustained Release 24 hr  Commonly known as:  TOPROL-XL   50 mg, DAILY  Refills:  0     montelukast 10 mg Tablet  Commonly known as:  SINGULAIR   10 mg, Oral  Refills:  0     omeprazole 40 mg Capsule, Delayed Release(E.C.)  Commonly known as:  PRILOSEC   40 mg, Oral, DAILY  Qty:  90 Cap  Refills:  4     potassium chloride 20 mEq Tab Sust.Rel. Particle/Crystal  Commonly known as:  K-DUR   20 mEq, Oral  Refills:  0     prochlorperazine 10 mg Tablet  Commonly known as:  COMPAZINE   10 mg, Oral  Refills:  0     promethazine 25 mg Tablet  Commonly known as:  PHENERGAN   25 mg, Oral  Refills:  0     ZOFRAN 8 mg Tablet  Generic drug:  ondansetron   8 mg, Oral, EVERY 8 HOURS PRN  Refills:  0            DISCHARGE INSTRUCTIONS:   No discharge procedures on file.      REASON FOR HOSPITALIZATION AND HOSPITAL COURSE:  This is a 72 y.o., female that was admitted into the hospital with anasarca.  The patient does have a history of pancreatic cancer and pulmonary embolisms.  The patient has undergone a Whipple procedure and has had radiation and chemotherapy prior to coming in.  Patient has been having worsening swelling.  The patient was diuresed with good success through the beginning portions of this admission.  She did unfortunately became rather hyponatremic.  Patient was  seen and evaluated by Nephrology as well.  They are following.  Sodium levels are improving somewhat.  She is seemingly less symptomatic.  She is also chronically anemic.  Patient is going to [facility] today and will be monitored by Nephrology in the hospitalist service.  Sodium levels to be monitored at skilled nursing unit.              Filed Vitals:    09/03/18 1900 09/04/18 0300 09/04/18 0400 09/04/18 0825   BP: (!) 128/54 (!) 122/49 (!) 122/49 (!) 133/57   Pulse: 82 73 73 76   Resp: 18 16 16 16    Temp: 37.1 C (98.8 F) 36.6 C (97.9 F) 36.6 C (97.9 F) 36.9 C (98.4 F)   SpO2: 100% 100% 100% 100%       Physical Exam:  General:In bed, alert, oriented, cooperative, appears chronically ill, O2 via nasal cannula, pale, no acute distress  Cardiac:Regular rate  Respiratory:Moderate to good air entry, clear to auscultation bilaterally  Abdomen: Positive bowel sounds, soft, nontender  Extremities:Trace to +1 pitting edema, much improved  Skin: Warm and dry, pale    SIGNIFICANT LAB:          Results for orders placed or performed during the hospital encounter of 08/25/18 (from the past 24 hour(s))   TROPONIN-I   Result Value Ref Range    TROPONIN I 0.01 <=0.04 ng/mL   URINALYSIS WITH REFLEX MICROSCOPIC AND CULTURE IF POSITIVE    Narrative    The following orders were created for panel order URINALYSIS WITH REFLEX MICROSCOPIC AND CULTURE IF POSITIVE.  Procedure                               Abnormality         Status                     ---------                               -----------         ------  URINALYSIS, MACRO/MICRO[[id number]]      Abnormal            Final result                 Please view results for these tests on the individual orders.   URINALYSIS, MACRO/MICRO   Result Value Ref Range    COLOR Yellow Yellow, Straw, Colorless    APPEARANCE Clear Clear, Cloudy    SPECIFIC GRAVITY 1.011 1.010 - 1.025    PH 7.5 (H) 5.0 - 7.0    LEUKOCYTES Trace (A) Negative WBCs/uL       NITRITE Negative Negative    PROTEIN Negative Negative, Trace mg/dL    GLUCOSE Negative Negative mg/dL    KETONES Negative Negative mg/dL    UROBILINOGEN 0.2  Normal, 0.2 , 1.0 mg/dL    BILIRUBIN Negative Negative mg/dL    BLOOD Negative Negative mg/dL    RBCS 0-2 (A) None /hpf    WBCS 0-5 (A) None /hpf    BACTERIA None None /hpf    SQUAMOUS EPITHELIAL Rare None, Rare, Occasional /hpf    HYALINE CASTS Occasional None, Rare, Few, Occasional, Many /lpf   CREATININE URINE, RANDOM   Result Value Ref Range    CREATININE RANDOM URINE 31 No known normals mg/dL   SODIUM, RANDOM URINE   Result Value Ref Range    SODIUM RANDOM URINE 95 No known normals mmol/L   UREA NITROGEN, RANDOM URINE   Result Value Ref Range    UREA NITROGEN RANDOM URINE 117 mg/dL   OSMOLALITY, RANDOM URINE   Result Value Ref Range    OSMOLALITY URINE 311 250 - 900 mOsm/kg   SODIUM   Result Value Ref Range    SODIUM 123 (L) 135 - 145 mmol/L   SODIUM   Result Value Ref Range    SODIUM 122 (L) 135 - 145 mmol/L   SODIUM   Result Value Ref Range    SODIUM 124 (L) 135 - 145 mmol/L   BASIC METABOLIC PANEL, FASTING   Result Value Ref Range    SODIUM 126 (L) 135 - 145 mmol/L    POTASSIUM 4.2 3.5 - 5.0 mmol/L    CHLORIDE 98 98 - 111 mmol/L    CO2 TOTAL 28 21 - 35 mmol/L    ANION GAP 0 mmol/L    CALCIUM 7.5 (L) 8.8 - 10.3 mg/dL    GLUCOSE 61 (L) 70 - 110 mg/dL    BUN 8 (L) 10 - 25 mg/dL    CREATININE 0.72 <=1.30 mg/dL    BUN/CREA RATIO 11     ESTIMATED GFR >60 Avg: 75 mL/min/1.37m^2   CBC/DIFF    Narrative    The following orders were created for panel order CBC/DIFF.  Procedure                               Abnormality         Status                     ---------                               -----------         ------                     CBC WITH MWNU[[id number]]  Abnormal            Final result                 Please view results for these tests on the individual orders.   THYROID STIMULATING HORMONE (SENSITIVE  TSH)   Result Value Ref Range    TSH 2.900 0.450 - 5.330 uIU/mL   CBC WITH DIFF   Result Value Ref Range    WBC 3.2 (L) 3.5 - 10.3 x10^3/uL    RBC 2.36 (L) 3.80 - 5.24 x10^6/uL    HGB 7.5 (L) 11.8 - 15.8 g/dL    HCT 21.7 (L) 34.6 - 46.2 %    MCV 92.2 82.3 - 96.7 fL    MCH 31.6 27.6 - 33.2 pg    MCHC 34.3 32.6 - 35.4 g/dL    RDW 14.2 12.4 - 15.2 %    PLATELETS 149 140 - 440 x10^3/uL    MPV 10.1 6.6 - 10.2 fL    NEUTROPHIL % 51 %    LYMPHOCYTE % 26 %    MONOCYTE % 15 %    EOSINOPHIL % 5 %    BASOPHIL % 3 %    NEUTROPHIL # 1.70 1.50 - 6.40 x10^3/uL    LYMPHOCYTE # 0.80 (L) 0.90 - 3.40 x10^3/uL    MONOCYTE # 0.50 0.20 - 0.90 x10^3/uL    EOSINOPHIL # 0.20 0.00 - 0.50 x10^3/uL    BASOPHIL # 0.10 0.00 - 0.20 x10^3/uL   CORTISOL, SERUM   Result Value Ref Range    CORTISOL 10.3 ug/dL   POC BLOOD GLUCOSE (RESULTS)   Result Value Ref Range    GLUCOSE, POC 103 70 - 110 mg/dl   POC BLOOD GLUCOSE (RESULTS)   Result Value Ref Range    GLUCOSE, POC 286 (H) 70 - 110 mg/dl   POC BLOOD GLUCOSE (RESULTS)   Result Value Ref Range    GLUCOSE, POC 54 (L) 70 - 110 mg/dl   POC BLOOD GLUCOSE (RESULTS)   Result Value Ref Range    GLUCOSE, POC 118 (H) 70 - 110 mg/dl       SIGNIFICANT RADIOLOGY:       Results for orders placed or performed during the hospital encounter of 08/25/18   XR AP MOBILE CHEST     Status: None    Narrative    Kashvi Sindt    PROCEDURE DESCRIPTION: XR AP MOBILE CHEST    PROCEDURE PERFORMED DATE AND TIME: 08/25/2018 6:48 PM    CLINICAL INDICATION: SOB r/o EDEMA    TECHNIQUE: 1 views / 2 images submitted.    COMPARISON: Chest x-ray dated October 18, 2017      FINDINGS: The heart size is upper limits of normal. There is a right-sided  infusion port catheter. There is no infiltrate or pleural fluid. There are  degenerative changes of the spine and shoulders.      Impression    No acute process        Radiologist location ID: [id number]     [place] RT UPPER QUADRANT     Status: None       Narrative    Jeanetta Broeker    [place] RT UPPER QUADRANT performed on 08/26/2018 8:40 AM    INDICATION: 72 years old Female  Pancreatic cancer-elevated LFT status post Whipple procedure.  Please  evaluate pancreas, kidney and liver with portal when  flow.    TECHNIQUE: Transabdominal ultrasound of the right upper quadrant.    COMPARISON:  Ultrasound 08/02/2018.    FINDINGS:   Overall the pancreas is not well evaluated by ultrasound. Pancreas is  incompletely visualized. The included distal body/tail appears atrophic but  otherwise grossly unremarkable. In the location of the pancreatic head with  tip to be located (the patient has a reported history of Whipple  procedure), there is a heterogeneously hypoechoic structure which appears  similar to that of the adjacent liver. This may be reflective of liver  tissue, but mass is not excluded.    The liver is mildly enlarged and demonstrates coarsening of its echotexture  along with slight nodularity along the liver contour. Findings suggest a  degree of underlying fibrosis. Only a segment of the portal vein is  visualized, which demonstrates proper direction of blood flow.    Status post cholecystectomy. Included segment of the proximal common bile  duct is nondilated.    The right kidney measures 10 x 4.6 cm.  There is no evidence  hydronephrosis.    There is a small volume of free perihepatic fluid.    Right pleural effusion.        Impression    1. Pancreas is not well evaluated by ultrasound. In the usual location of  the pancreatic head (which is absent due to Whipple procedure) there is  heterogeneous soft tissue and may be associated with the liver but mass  cannot be excluded by ultrasound. Further evaluation with contrast-enhanced  CT would be required to further evaluate this.  2. Mild hepatomegaly. Slight nodularity along liver edge and slight  coarsening of the hepatic echotexture suggests a degree of underlying  fibrosis.  3. Small volume of  right upper quadrant ascites.  4. Right pleural effusion.        Radiologist location ID: [id number]     XR AP MOBILE CHEST     Status: None    Narrative    Stephie Sappington    PROCEDURE DESCRIPTION: XR AP MOBILE CHEST.  PROCEDURE PERFORMED DATE AND TIME: 09/03/2018 1:57 AM.  CLINICAL INDICATION: dyspnea.  TECHNIQUE: 1 view(s) / 1 image(s) submitted.  COMPARISON: 08/25/2018.    FINDINGS:  The RIGHT Port-A-Cath has a stable configuration.  No pneumothorax.  No pleural effusion.  No focal infiltrate.  Heart size is within normal range.  No acute bony abnormality is appreciated.  No significant change is identified.      Impression    1. No acute cardiopulmonary process is appreciated.     REP        Radiologist location ID: [id number]         CONSULTATIONS:  Cardiology and Nephrology  PROCEDURES PERFORMED:  None    DOES PATIENT HAVE ADVANCED DIRECTIVES:  Yes, Patient Does Have Advance Directive for Healthcare Treatment    CONDITION ON DISCHARGE: Alert, Oriented and VS Stable    DISCHARGE DISPOSITION:  Skilled Nursing Unit              Copies sent to Care Team       Relationship Specialty Notifications Start End    Ignacia Felling, MD PCP - General EXTERNAL  03/30/18     Phone: [phone] Fax: [phone]         [place] [address] [place] [address]          Vedia Pereyra, MD  09/04/2018, 10:25

## 2018-09-05 LAB — POC BLOOD GLUCOSE (RESULTS)
GLUCOSE, POC: 98 mg/dL (ref 70–110)
GLUCOSE, POC: 187 mg/dL — ABNORMAL HIGH (ref 70–110)
GLUCOSE, POC: 236 mg/dL — ABNORMAL HIGH (ref 70–110)
GLUCOSE, POC: 239 mg/dL — ABNORMAL HIGH (ref 70–110)

## 2018-09-05 LAB — CBC WITH DIFF
BASOPHIL #: 0.1 10*3/uL (ref 0.00–0.20)
BASOPHIL %: 4 %
EOSINOPHIL #: 0.3 x10ˆ3/uL (ref 0.00–0.50)
EOSINOPHIL %: 10 %
HCT: 21.5 % — ABNORMAL LOW (ref 34.6–46.2)
HGB: 7.2 g/dL — ABNORMAL LOW (ref 11.8–15.8)
LYMPHOCYTE #: 0.7 x10ˆ3/uL — ABNORMAL LOW (ref 0.90–3.40)
LYMPHOCYTE %: 26 %
MCH: 31.5 pg (ref 27.6–33.2)
MCHC: 33.6 g/dL (ref 32.6–35.4)
MCV: 93.8 fL (ref 82.3–96.7)
MONOCYTE #: 0.4 10*3/uL (ref 0.20–0.90)
MONOCYTE %: 14 %
MPV: 10.3 fL — ABNORMAL HIGH (ref 6.6–10.2)
NEUTROPHIL #: 1.2 x10ˆ3/uL — ABNORMAL LOW (ref 1.50–6.40)
NEUTROPHIL %: 46 %
PLATELETS: 138 x10ˆ3/uL — ABNORMAL LOW (ref 140–440)
RBC: 2.29 x10ˆ6/uL — ABNORMAL LOW (ref 3.80–5.24)
RDW: 14.4 % (ref 12.4–15.2)
WBC: 2.7 x10ˆ3/uL — ABNORMAL LOW (ref 3.5–10.3)

## 2018-09-05 LAB — BASIC METABOLIC PANEL
ANION GAP: 2 mmol/L
BUN/CREA RATIO: 11
BUN/CREA RATIO: 11
BUN: 7 mg/dL — ABNORMAL LOW (ref 10–25)
CALCIUM: 7.6 mg/dL — ABNORMAL LOW (ref 8.8–10.3)
CHLORIDE: 99 mmol/L (ref 98–111)
CO2 TOTAL: 27 mmol/L (ref 21–35)
CREATININE: 0.66 mg/dL (ref <=1.30)
ESTIMATED GFR: >60 mL/min/{1.73_m2}
GLUCOSE: 87 mg/dL (ref 70–110)
POTASSIUM: 4.6 mmol/L (ref 3.5–5.0)
POTASSIUM: 4.6 mmol/L (ref 3.5–5.0)
SODIUM: 128 mmol/L — ABNORMAL LOW (ref 135–145)

## 2018-09-05 MED ADMIN — albumin, human 5 % intravenous solution: ORAL | @ 09:00:00 | NDC 00944049101

## 2018-09-05 MED ADMIN — metoprolol succinate ER 25 mg tablet,extended release 24 hr: ORAL | @ 12:00:00

## 2018-09-05 MED ADMIN — hydrALAZINE 10 mg tablet: ORAL | @ 22:00:00

## 2018-09-05 MED ADMIN — sodium chloride 0.9 % (flush) injection syringe: @ 22:00:00

## 2018-09-05 MED ADMIN — mupirocin calcium 2 % topical cream: TOPICAL | @ 15:00:00 | NDC 68462056417

## 2018-09-05 MED ADMIN — erythromycin 5 mg/gram (0.5 %) eye ointment: @ 15:00:00 | NDC 24208091019

## 2018-09-05 NOTE — PT Treatment (Signed)
[place]  Physical Therapy Treatment Note    Patient Name: [name]  Date of Birth: [birth year]  Height:  152.4 cm (5')  Weight:  76.9 kg (169 lb 9.6 oz)  Room/Bed: 5121/A  Payor: MEDICARE / Plan: MEDICARE PART A AND B / Product Type: Medicare /     Date/Time of Admission: 09/04/2018  2:15 PM  Admitting Diagnosis:  Anasarca [R60.1]    The patient was seen this session for 53 total billable minutes.  The following procedures were performed this session  Therapeutic Exercise, ea 15 min (97110), Therapeutic Activity, ea 15 min (28638) and Gait Training, ea 15 min (17711).      Subjective:     Patient with c/o no pain.      Objective:   Pt supine in bed, agreeable to PT.  2L02 donned t/o session.  Pt performs supine and seated TE x 20 reps BLE to promote increased ROM/strength.  Requires resting break after each there ex.  Transfers to EOB with SBA; requests brief to be changed -- note HEAVILY soiled.  Pt dep for brief change and hygiene.  Pt then transfers STS with SBA; amb 50' x 2 with rollator walker and WBA + w/c follow -- demos slow continuous step through gait with decreased stride and cadence.  Lengthy resting break between each walk.  Pt returns to room and transfers to bedside chair with SBA.  Call bell in reach.     Assessment:   Pt fatigues easily with therex and gait however tolerates good amount of functional activity.     Plan:   Continue to follow according to established plan of care.    The risks/benefits of therapy have been discussed with the patient/caregiver and he/she is in agreement with the established plan of care.     Therapist:   Creta Levin, PT,  09/05/2018  Treatment Time In: 230p  Treatment Time Out: 323p  Time may include review of medical chart, obtaining patient's functional history from patient/family/medical staff/case management/ancillary personnel, collaboration on findings and treatment options (with the above mentioned  individuals), re-assessment, and acute care rehabilitation.

## 2018-09-05 NOTE — Care Plan (Signed)
Medical Nutrition Therapy Assessment        SUBJECTIVE : MD consult for nutr assess.  Pt appetite good x 3 meals(100%).  Labs noted.  Pt with history of anasarca r.t CHF and liver cirrhosis, PE, Pancreatic cancer DM II,Asthma, HTN, Anemia and NSTEMI.    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      Keara Pagliarulo Ashcraft-Carr, RDLD  09/05/2018, 16:54      Pager # [phone]

## 2018-09-05 NOTE — PT Treatment (Signed)
[place]  Physical Therapy Treatment Note    Patient Name: [name]  Date of Birth: October 19, 1946  Height:  152.4 cm (5')  Weight:  76.9 kg (169 lb 9.6 oz)  Room/Bed: 5121/A  Payor: MEDICARE / Plan: MEDICARE PART A AND B / Product Type: Medicare /     Date/Time of Admission: 09/04/2018  2:15 PM  Admitting Diagnosis:  Anasarca [R60.1]    The patient was seen this session for 15 total billable minutes.  The following procedures were performed this session  Therapeutic Exercise, ea 15 min (70786).      Subjective:     Patient with c/o no pain but general malaise.      Objective:   Pt supine in bed, 2L02 donned.  Appears somewhat pale.  PCA in to assist pt in showering.  PT dispensed and ed pt on LE there ex packet of supine, sitting and standing TE x 10-20 reps BLE to promote increased ROM/strength.  Pt verbalizes understanding.  PT also dispensed leg lifter and ed pt on use --- plan to practice transfer this pm.  Call bell in reach and pt left with PCA.     Assessment:   Pt verbalizes and demos understanding of therex packet to promote increased ROM/strength. Pt with difficulty leg lifting.     Plan:   Continue to follow according to established plan of care.    The risks/benefits of therapy have been discussed with the patient/caregiver and he/she is in agreement with the established plan of care.     Therapist:   Creta Levin, PT,  09/05/2018  Treatment Time In: 1030a  Treatment Time Out: 7544B  Time may include review of medical chart, obtaining patient's functional history from patient/family/medical staff/case management/ancillary personnel, collaboration on findings and treatment options (with the above mentioned individuals), re-assessment, and acute care rehabilitation.

## 2018-09-06 LAB — MAGNESIUM: MAGNESIUM: 1.5 mg/dL — ABNORMAL LOW (ref 1.8–2.3)

## 2018-09-06 LAB — BASIC METABOLIC PANEL
ANION GAP: 2 mmol/L
BUN/CREA RATIO: 10
BUN: 7 mg/dL — ABNORMAL LOW (ref 10–25)
CALCIUM: 7.9 mg/dL — ABNORMAL LOW (ref 8.8–10.3)
CHLORIDE: 96 mmol/L — ABNORMAL LOW (ref 98–111)
CO2 TOTAL: 27 mmol/L (ref 21–35)
CREATININE: 0.67 mg/dL (ref <=1.30)
ESTIMATED GFR: >60 mL/min/{1.73_m2}
GLUCOSE: 88 mg/dL (ref 70–110)
POTASSIUM: 4.9 mmol/L (ref 3.5–5.0)
SODIUM: 125 mmol/L — ABNORMAL LOW (ref 135–145)

## 2018-09-06 LAB — CBC
HCT: 26 % — ABNORMAL LOW (ref 34.6–46.2)
HGB: 8.7 g/dL — ABNORMAL LOW (ref 11.8–15.8)
MCH: 31.4 pg (ref 27.6–33.2)
MCHC: 33.5 g/dL (ref 32.6–35.4)
MCV: 93.6 fL (ref 82.3–96.7)
MPV: 10.3 fL — ABNORMAL HIGH (ref 6.6–10.2)
PLATELETS: 151 x10ˆ3/uL (ref 140–440)
RBC: 2.78 10*6/uL — ABNORMAL LOW (ref 3.80–5.24)
RDW: 14.4 % (ref 12.4–15.2)
WBC: 3.2 10*3/uL — ABNORMAL LOW (ref 3.5–10.3)

## 2018-09-06 LAB — POC BLOOD GLUCOSE (RESULTS)
GLUCOSE, POC: 98 mg/dL (ref 70–110)
GLUCOSE, POC: 98 mg/dl (ref 70–110)
GLUCOSE, POC: 177 mg/dL — ABNORMAL HIGH (ref 70–110)
GLUCOSE, POC: 236 mg/dL — ABNORMAL HIGH (ref 70–110)
GLUCOSE, POC: 239 mg/dL — ABNORMAL HIGH (ref 70–110)

## 2018-09-06 LAB — PHOSPHORUS: PHOSPHORUS: 3.8 mg/dL (ref 2.5–4.5)

## 2018-09-06 MED ORDER — MAGNESIUM OXIDE 400 MG (241.3 MG MAGNESIUM) TABLET
400.00 mg | ORAL_TABLET | Freq: Two times a day (BID) | ORAL | Status: DC
Start: 2018-09-06 — End: 2018-09-25
  Administered 2018-09-06 – 2018-09-25 (×38): 400 mg via ORAL
  Filled 2018-09-06 (×43): qty 1

## 2018-09-06 MED ORDER — SODIUM CHLORIDE 0.9 % INTRAVENOUS SOLUTION
INTRAVENOUS | Status: DC
Start: 2018-09-06 — End: 2018-09-07
  Administered 2018-09-07: 1000 mL via INTRAVENOUS

## 2018-09-06 MED ADMIN — nystatin 100,000 unit/gram topical powder: ORAL | @ 07:00:00 | NDC 00574200815

## 2018-09-06 MED ADMIN — POTASSIUM CHLORIDE 20 MEQ WITH LIDOCAINE 1% IN NS 100ML IVPB: ORAL | @ 22:00:00 | NDC 00409665318

## 2018-09-06 NOTE — Care Management Notes (Signed)
Referral Information  ++++++ Placed Provider #1 ++++++  Case Manager: Paul McAllister  Provider Type: Swing  Provider Name: [facility]  Address:  [address]  , [place] [address]  Contact:    Fax:   Fax:

## 2018-09-06 NOTE — PT Treatment (Signed)
[place]  Physical Therapy Treatment Note    Patient Name: [name]  Date of Birth: 1946-08-16  Height:  152.4 cm (5')  Weight:  76.9 kg (169 lb 9.6 oz)  Room/Bed: 5121/A  Payor: MEDICARE / Plan: MEDICARE PART A AND B / Product Type: Medicare /     Date/Time of Admission: 09/04/2018  2:15 PM  Admitting Diagnosis:  Anasarca [R60.1]    The patient was seen this session for 23 total billable minutes.  The following procedures were performed this session  Therapeutic Exercise, ea 15 min (97110) and Gait Training, ea 15 min [phone]).      Subjective:     Patient with c/o mild headache pain.      Objective:   Pt supine in bed, 2L02 donned but pt removes during PT session. Pt transfers to EOB with CGA; STS with SBA; amb with rollator walker 2 x 50' with SBA -- demos slow continuous step through gait with increased resting break between each gait bout.  Pt returns to bedside chair and performs seated BLE TE x 20 reps to promote increased ROM/strength.  Call bell in reach.  SPO2 following session on RA 100%.  Pt left nasal cannula off and PT instructed pt to ring for nsg should she become SOB.     Assessment:   Pt tol tx well.  Did not desaturate with activity on RA.     Plan:   Continue to follow according to established plan of care.    The risks/benefits of therapy have been discussed with the patient/caregiver and he/she is in agreement with the established plan of care.     Therapist:   Creta Levin, PT,  09/06/2018  Treatment Time In: 240p  Treatment Time Out: 893T  Time may include review of medical chart, obtaining patient's functional history from patient/family/medical staff/case management/ancillary personnel, collaboration on findings and treatment options (with the above mentioned individuals), re-assessment, and acute care rehabilitation.

## 2018-09-06 NOTE — PT Treatment (Addendum)
[place]  Physical Therapy Treatment Note    Patient Name: [name]  Date of Birth: [birth year]  Height:  152.4 cm (5')  Weight:  76.9 kg (169 lb 9.6 oz)  Room/Bed: 5121/A  Payor: MEDICARE / Plan: MEDICARE PART A AND B / Product Type: Medicare /     Date/Time of Admission: 09/04/2018  2:15 PM  Admitting Diagnosis:  Anasarca [R60.1]    The patient was seen this session for 25 total billable minutes.  The following procedures were performed this session  Therapeutic Activity, ea 15 min (09381) and Gait Training, ea 15 min [phone]).      Subjective:     Patient with c/o mod headache pain.  Also c/o 'being cold'.  States she had 'night sweates' all night long.       Objective:   Pt supine in bed, O2 not donned.  Pt transfers to EOB with CGA; STS with SBA; amb 50' x 2 with rollator walker and SBA -- demos continuous step through gait with decreased stride and cadence; pt returns to bedside and requests to go to bathroom as she had soiled her brief -- dep for brief change and sock change as they were both soiled -- dep for hygiene of legs and urine.  Pt then amb back to bedside chair and set up for lunch.  Call bell in reach.     Assessment:   Pt with increased fatigue this session.     Plan:   Continue to follow according to established plan of care.    The risks/benefits of therapy have been discussed with the patient/caregiver and he/she is in agreement with the established plan of care.     Therapist:   Creta Levin, PT,  09/06/2018  Treatment Time In: 7169C  Treatment Time Out: 7893Y  Time may include review of medical chart, obtaining patient's functional history from patient/family/medical staff/case management/ancillary personnel, collaboration on findings and treatment options (with the above mentioned individuals), re-assessment, and acute care rehabilitation.

## 2018-09-06 NOTE — Care Plan (Signed)
Medical Nutrition Therapy F/U        SUBJECTIVE : Pt admitted for anasarca with PMH sig for CHF, liver cirrhosis, pancreatic CA, DM II, NSTEMI. Pt reported good PO intake and said she follows a general diet at home. Pt said she has some GI discomfort/pain after eating and didn't know if was related to hx of pancreatic CA. Pt said she is still eating well despite this pain and that the pain subsides after having a BM. Pt said at home she drinks Ensure 1-2/day and would like to have one here. Pt agreed to [employer] 1/day with breakfast. Pt reported UBW of 167 lbs with recent wt fluctuation r/t fluid issues. Reviewed pt's Na+ ;evels with her and also recommended pt weigh herself daily to keep track of fluid status. Recommended pt stay hydrated but not over do fluids. Pt verbalized understanding. Pt had no additional nutrition questions or concerns at this time.      OBJECTIVE:   Results for [name], [name] (MRN [id number]) as of 09/06/2018 14:51   Ref. Range 09/04/2018 13:34 09/04/2018 16:10 09/05/2018 06:05 09/06/2018 06:18   SODIUM Latest Ref Range: 135 - 145 mmol/L 127 (L) 127 (L) 128 (L) 125 (L)     Results for [name], [name] (MRN [id number]) as of 09/06/2018 14:27   Ref. 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  2) Add [employer] 1/day with breakfast.   3) Recommend a fluid restriction if decreased Na+ levels continue.  4) Monitor wts/PO intake/labs. RD available as needed.     Nutrition Diagnosis: Altered nutrition related lab values related to Current medical condition as evidenced by decreased Na+ levels.    Othelia Pulling, [place]  09/06/2018, 14:30  Pager # [phone]

## 2018-09-07 LAB — URINE CULTURE,ROUTINE
URINE CULTURE: 10000 — AB
URINE CULTURE: 100000 — AB

## 2018-09-07 LAB — POC BLOOD GLUCOSE (RESULTS)
GLUCOSE, POC: 79 mg/dL (ref 70–110)
GLUCOSE, POC: 243 mg/dL — ABNORMAL HIGH (ref 70–110)
GLUCOSE, POC: 150 mg/dL — ABNORMAL HIGH (ref 70–110)
GLUCOSE, POC: 206 mg/dL — ABNORMAL HIGH (ref 70–110)

## 2018-09-07 MED ADMIN — magnesium oxide 400 mg (241.3 mg magnesium) tablet: ORAL | @ 22:00:00

## 2018-09-07 MED ADMIN — phenylephrine 0.25 %-mineral oil 14 %-petrolatm 74.9 % rectal ointment: ORAL | @ 13:00:00 | NDC 00573287120

## 2018-09-07 MED ADMIN — potassium chloride 20 mEq/L in dextrose 5 %-0.45 % sodium chloride IV: INTRAVENOUS | @ 06:00:00 | NDC 00338067104

## 2018-09-07 MED ADMIN — HYDROcodone 5 mg-acetaminophen 325 mg tablet: ORAL | @ 06:00:00

## 2018-09-07 MED ADMIN — sodium chloride 0.9 % intravenous solution: ORAL | @ 08:00:00 | NDC 00338004904

## 2018-09-07 NOTE — OT Evaluation (Signed)
[facility] Occupational Therapy Evaluation  Rehabilitation Services      Patient Name: [name]  Date of Birth: [birth year]  Height: Height: 152.4 cm (5')  Weight: Weight: 76.9 kg (169 lb 9.6 oz)  Room/Bed: 5121/A  Payor: Payor: MEDICARE / Plan: MEDICARE PART A AND B / Product Type: Medicare /     Admission Diagnosis: Anasarca [R60.1]      Assessment:      (P) Patient is a 72 yo female admitted to [facility] with anasarca who lives with her sister and her husband in a [facility] with a ramp to enter. Patient reports having a walkin shower with grab abrs and a shower chair and as of the last few weeks was receiving min A for bathing from her sister and hired help 2 hours a day a few times a week. Prior to that time patient was independent with all ADLs and her goal is to return to independent lifestyle. Patient would benefit from skilled OT services 5x/week as she is currently performing ADLs below her functional baseline and reports overall decline in strength.           Plan:     (P) ADL Retraining, Higher education careers adviser, UE Strengthening/ROM, Engineer, production, IT trainer, Equipment Evaluation/Education, Compensatory Technique Education    OT Frequency: (P) 5x/wk      Evaluation:              09/07/18 1407   Therapist Pager   OT Assigned/ Pager # Hubbard Robinson   Restrictions/Precautions   Discharge Restrictions/Precautions No   Required Braces or Orthoses No   General   Type of Evaluation Evaluation   Chart Reviewed Yes   Additional Pertinent History PMH: PE, pancreatic CA, CHF, DM2, HTN, NSTEMI, anxiety and depression, GERD, HLD, peripheral neuropathy   Home Living   Type of Home House   Lives With Other (Comment)  (sister and her husband)   Warden/ranger Walker rolling or standard;Other (Comment)  (rollator, shower chair)   Prior Function   Level of Independence Independent with ADLs and functional transfers;Needs assistance with homemaking   ADL Assistance  Needs assistance   Bath Minimal  (sister would wash back and hair due to fatigue)   Prior Function Comments sister would be at home while she showered - was getting progressively worse in recent weeks, I with ADLs as of 3-4 weeks ago   RUE Assessment   RUE Assessment WFL- Within Functional Limits   LUE Assessment   LUE Assessment WFL- Within Functional Limits   Transfer Assessment/Treatment   Sit-Stand-Sit, Assist Device walker, four wheeled   Bed-Chair-Bed Assist Device walker, four wheeled   Toilet Transfer Assist Device walker, four wheeled   Bathing Assessment/Training   Position sitting;supported standing   Independence Level maximum assist (25% patient effort)   Upper Body Dressing Assessment/Training   Position  sitting   Independence Level  set up required   Lower Body Dressing Assessment/Training   Position sitting;supported standing   Independence Level  maximum assist (25% patient effort)   Toileting Assessment/Training   Position sitting;supported standing   Independence Level  maximum assist (25% patient effort)   RUE Assessment   RUE Assessment WFL   LUE Assessment   LUE Assessment WFL   Care Plan Goals   OT Rehab Goals Bathing Goal;Grooming Goal;LB Dressing Goal;Strength Goal;Toileting Goal;UB Dressing Goal   Bathing Goal   Bathing Goal, Date Established 09/07/18   Bathing Goal, Time to  Achieve by discharge   Bathing Goal, Independence Level modified independence   Bathing Goal, Adaptive Equipment shower chair   Grooming Goal   Grooming Goal, Date Established 09/07/18   Grooming Goal, Time to Achieve by discharge   Grooming Goal, Activity Type all grooming tasks   Grooming Goal, Independence  independent   LB Dressing Goal   LB Dressing Goal, Date Established 09/07/18   LB Dressing Goal, Time to Achieve by discharge   LB Dressing Goal, Activity Type all lower body dressing tasks   LB Dressing Goal, Independence Level supervision required   Strength Goal   Strength Goal, Date Established 09/07/18      Strength Goal, Time to Achieve by discharge   Strength Goal, Measure to Achieve increase B UE strength for increased endurance for ADLs   Toileting Goal   Toileting Goal, Date Established 09/07/18   Toileting Goal, Time to Achieve by discharge   Toileting Goal, Activity Type all toileting tasks   Toileting Goal, Independence Level modified independence   UB Dressing Goal   UB Dressing  Goal, Date Established 09/07/18   UB Dressing Goal, Time to Achieve by discharge   UB Dressing Goal, Activity Type all upper body dressing tasks   UB Dressing Goal, Independence Level independent   Assessment   Assessment Patient is a 72 yo female admitted to [facility] with anasarca who lives with her sister and her husband in a [facility] with a ramp to enter. Patient reports having a walkin shower with grab abrs and a shower chair and as of the last few weeks was receiving min A for bathing from her sister and hired help 2 hours a day a few times a week. Prior to that time patient was independent with all ADLs and her goal is to return to independent lifestyle. Patient would benefit from skilled OT services 5x/week as she is currently performing ADLs below her functional baseline and reports overall decline in strength.    Problem List Decreased ADL status;Decreased upper extremity strength;Decreased endurance;Decreased functional mobility   Activity Tolerance   Activity Tolerance Patient limited by fatigue   OT Plan   Treatment Interventions ADL Retraining;Functional Transfer Training;UE Strengthening/ROM;Endurance Training;Patient/Family Training;Equipment Evaluation/Education;Compensatory Technique Education   OT Frequency 5x/wk   Duration  until goals are met   OT- OK to discharge Yes   Plan   Plan Y         Certification From:    09/07/2018             To:  12/07/2018          Hubbard Robinson, OT  09/07/2018, 15:33    Agree with plan and management as written.    Vedia Pereyra, MD  09/07/2018, 15:59

## 2018-09-07 NOTE — Nurses Notes (Signed)
(+)   VRE Urine 09/03/18.  Maintain contact precautions through duration of admission.

## 2018-09-07 NOTE — PT Treatment (Signed)
[place]  Physical Therapy Treatment Note    Patient Name: [name]  Date of Birth: Nov 03, 1946  Height:  152.4 cm (5')  Weight:  76.9 kg (169 lb 9.6 oz)  Room/Bed: 5121/A  Payor: MEDICARE / Plan: MEDICARE PART A AND B / Product Type: Medicare /     Date/Time of Admission: 09/04/2018  2:15 PM  Admitting Diagnosis:  Anasarca [R60.1]    The patient was seen this session for 26 total billable minutes.  The following procedures were performed this session  Therapeutic Exercise, ea 15 min (97110) and Gait Training, ea 15 min [phone]).      Subjective:     Patient with c/o headache pain. Pt also states "I'm chilled to the bone."       Objective:   Pt supine in bed at SOS. Pt had on nasal cannula but tubing was NOT connected to wall outlet Supine to sit with S. HR and O2 assessed at EOB. O2 93-96% on RA and HR 74.  STS to rollator with SBA. Pt amb to bathroom with rollator and SBA. Pt Ind with hygiene of urine however pt required mod A for brief change. Pt amb with rollator and SBA on RA * 100' * 2. Pt returned to EOB at EOS. HR and O2 again checked. O2 97-100% and HR mid 30s. Nursing notified. Pt sitting EOB with nursing present at PTA exit.     Assessment:   Pt had a decrease in HR after amb. Pt did NOT show S/S of distress at present. Nursing later performed EKG.     Plan:   Continue to follow according to established plan of care.    The risks/benefits of therapy have been discussed with the patient/caregiver and he/she is in agreement with the established plan of care.     Therapist:   Arlyce Dice, PTA,  09/07/2018  Treatment Time In: 23p  Treatment Time Out: 406p  Time may include review of medical chart, obtaining patient's functional history from patient/family/medical staff/case management/ancillary personnel, collaboration on findings and treatment options (with the above mentioned individuals), re-assessment, and acute care rehabilitation.

## 2018-09-07 NOTE — PT Treatment (Signed)
[place]  Physical Therapy Treatment Note    Patient Name: [name]  Date of Birth: 1946-02-14  Height:  152.4 cm (5')  Weight:  76.9 kg (169 lb 9.6 oz)  Room/Bed: 5121/A  Payor: MEDICARE / Plan: MEDICARE PART A AND B / Product Type: Medicare /     Date/Time of Admission: 09/04/2018  2:15 PM  Admitting Diagnosis:  Anasarca [R60.1]    The patient was seen this session for 23 total billable minutes.  The following procedures were performed this session  Therapeutic Activity, ea 15 min (68341) and Gait Training, ea 15 min [phone]).      Subjective:     Patient with c/o no pain but persistent feeling of being cold "I'm chilled to the bone!".  Denies night sweats last night as she had previous night.      Objective:   Pt supine in bed, 2L02 donned but pt removes for PT.  Pt requests brief be changed -- dep for hygiene of urine and brief change, min/CGA for rolling R/L in bed.  Pt transfers sup --> sit with min/CGA; STS with SBA; amb 125' x 2 with rollator and SBA + w/c follow -- demos slow continuous step through gait -- mild DOE however SPO2 98% following gait.  Pt left in dayroom with PCA for activity luncheon.     Assessment:   Pt improving ambulatory endurance considerably without desaturation.  Mild SOB.      Plan:   Continue to follow according to established plan of care.    The risks/benefits of therapy have been discussed with the patient/caregiver and he/she is in agreement with the established plan of care.     Therapist:   Creta Levin, PT,  09/07/2018  Treatment Time In: 11a  Treatment Time Out: 9798X  Time may include review of medical chart, obtaining patient's functional history from patient/family/medical staff/case management/ancillary personnel, collaboration on findings and treatment options (with the above mentioned individuals), re-assessment, and acute care rehabilitation.

## 2018-09-08 LAB — BASIC METABOLIC PANEL
ANION GAP: 2 mmol/L
BUN/CREA RATIO: 10
BUN: 8 mg/dL — ABNORMAL LOW (ref 10–25)
CALCIUM: 7.4 mg/dL — ABNORMAL LOW (ref 8.8–10.3)
CHLORIDE: 99 mmol/L (ref 98–111)
CO2 TOTAL: 26 mmol/L (ref 21–35)
CREATININE: 0.8 mg/dL (ref <=1.30)
ESTIMATED GFR: >60 mL/min/{1.73_m2}
GLUCOSE: 235 mg/dL — ABNORMAL HIGH (ref 70–110)
POTASSIUM: 4.6 mmol/L (ref 3.5–5.0)
SODIUM: 127 mmol/L — ABNORMAL LOW (ref 135–145)

## 2018-09-08 LAB — ECG 12 LEAD - ADULT
Calculated P Axis: 47 deg
Calculated T Axis: 64 deg
EKG Severity: ABNORMAL
Heart Rate: 63 {beats}/min
I 40 Axis: 37 deg
PR Interval: 202 ms
QRS Axis: -2 deg
QRS Duration: 94 ms
QT Interval: 424 ms
QTC Calculation: 435 ms
ST Axis: 96 deg
T 40 Axis: -21 deg

## 2018-09-08 LAB — ADULT ROUTINE BLOOD CULTURE, SET OF 2 BOTTLES (BACTERIA AND YEAST): BLOOD CULTURE, ROUTINE: NO GROWTH

## 2018-09-08 LAB — POC BLOOD GLUCOSE (RESULTS)
GLUCOSE, POC: 92 mg/dL (ref 70–110)
GLUCOSE, POC: 92 mg/dl (ref 70–110)
GLUCOSE, POC: 229 mg/dL — ABNORMAL HIGH (ref 70–110)
GLUCOSE, POC: 229 mg/dl — ABNORMAL HIGH (ref 70–110)
GLUCOSE, POC: 239 mg/dL — ABNORMAL HIGH (ref 70–110)
GLUCOSE, POC: 207 mg/dL — ABNORMAL HIGH (ref 70–110)

## 2018-09-08 MED ADMIN — lactated Ringers intravenous solution: ORAL | @ 07:00:00 | NDC 00338011704

## 2018-09-08 MED ADMIN — oxyCODONE 5 mg tablet: SUBCUTANEOUS | @ 21:00:00

## 2018-09-08 MED ADMIN — nystatin 100,000 unit/gram topical cream: ORAL | @ 09:00:00 | NDC 00168005415

## 2018-09-08 MED ADMIN — iopamidoL 370 mg iodine/mL (76 %) intravenous solution: @ 14:00:00

## 2018-09-09 DIAGNOSIS — E876 Hypokalemia: Secondary | ICD-10-CM

## 2018-09-09 DIAGNOSIS — K219 Gastro-esophageal reflux disease without esophagitis: Secondary | ICD-10-CM

## 2018-09-09 DIAGNOSIS — J449 Chronic obstructive pulmonary disease, unspecified: Secondary | ICD-10-CM

## 2018-09-09 DIAGNOSIS — I5032 Chronic diastolic (congestive) heart failure: Secondary | ICD-10-CM

## 2018-09-09 DIAGNOSIS — K7460 Unspecified cirrhosis of liver: Secondary | ICD-10-CM

## 2018-09-09 DIAGNOSIS — G4730 Sleep apnea, unspecified: Secondary | ICD-10-CM

## 2018-09-09 DIAGNOSIS — I110 Hypertensive heart disease with heart failure: Secondary | ICD-10-CM

## 2018-09-09 DIAGNOSIS — I21A1 Myocardial infarction type 2: Secondary | ICD-10-CM

## 2018-09-09 DIAGNOSIS — I4891 Unspecified atrial fibrillation: Secondary | ICD-10-CM

## 2018-09-09 DIAGNOSIS — N390 Urinary tract infection, site not specified: Secondary | ICD-10-CM

## 2018-09-09 DIAGNOSIS — I252 Old myocardial infarction: Secondary | ICD-10-CM

## 2018-09-09 DIAGNOSIS — R188 Other ascites: Secondary | ICD-10-CM

## 2018-09-09 DIAGNOSIS — G9341 Metabolic encephalopathy: Secondary | ICD-10-CM

## 2018-09-09 DIAGNOSIS — E871 Hypo-osmolality and hyponatremia: Secondary | ICD-10-CM

## 2018-09-09 DIAGNOSIS — C259 Malignant neoplasm of pancreas, unspecified: Secondary | ICD-10-CM

## 2018-09-09 LAB — POC BLOOD GLUCOSE (RESULTS)
GLUCOSE, POC: 74 mg/dL (ref 70–110)
GLUCOSE, POC: 166 mg/dL — ABNORMAL HIGH (ref 70–110)
GLUCOSE, POC: 275 mg/dL — ABNORMAL HIGH (ref 70–110)
GLUCOSE, POC: 177 mg/dL — ABNORMAL HIGH (ref 70–110)

## 2018-09-09 MED ADMIN — nystatin 100,000 unit/gram topical powder: TOPICAL | @ 10:00:00

## 2018-09-09 MED ADMIN — citalopram 20 mg tablet: ORAL | @ 10:00:00

## 2018-09-09 MED ADMIN — spironolactone 50 mg tablet: ORAL | @ 10:00:00

## 2018-09-09 MED ADMIN — DEXTROSE 5% 1/2 NORMAL SALINE W/ ADDITIVES: SUBCUTANEOUS | @ 12:00:00 | NDC 00264761200

## 2018-09-09 MED ADMIN — sodium chloride 0.9 % intravenous solution: @ 21:00:00 | NDC 00338004904

## 2018-09-09 MED ADMIN — lactated Ringers intravenous solution: ORAL | @ 14:00:00 | NDC 00338011704

## 2018-09-09 MED ADMIN — sodium chloride 0.9 % (flush) injection syringe: ORAL | @ 10:00:00

## 2018-09-09 MED ADMIN — hydrALAZINE 25 mg tablet: ORAL | @ 10:00:00

## 2018-09-09 MED ADMIN — lactated Ringers intravenous solution: ORAL | @ 21:00:00

## 2018-09-09 NOTE — Progress Notes (Signed)
Hospitalist   Progress Note    [name]       72 y.o.       Date of service: 09/09/2018  Date of Admission:  09/04/2018    Hospital Day:  LOS: 5 days   CC: No chief complaint on file.      Subjective:  Patient is resting in bed.  Family at bedside.  She denies any acute complaints other than generalized fatigue.  No GI or GU complaints.    Objective:   Filed Vitals:    09/08/18 0500 09/08/18 1700 09/09/18 0630 09/09/18 1716   BP: (!) 136/58 132/60 (!) 156/59 (!) 150/55   Pulse: 70 62 69 66   Resp: 17 18 18 18    Temp: 36.3 C (97.3 F) 35.8 C (96.4 F) 36.6 C (97.9 F) 36 C (96.8 F)   SpO2: 100%  100%      I have reviewed the vitals.    Physical Exam:  Constitutional: Patient is in no acute distress.    HEENT: Head normocephalic and atraumatic.   Lungs: Clear to auscultation bilaterally without wheezes, rales or rhonchi.  Cardiovascular: Regular rate and rhythm. No murmur, rub, or gallop.   Abdomen: Normal bowel sounds. Soft, nontender, nondistended.   MSK/Extremities: Moves all extremities. Pulses equal bilaterally.  Trace bilateral lower extremity edema  Skin: No lesions noted.  No rashes.  Neuro: Alert and oriented X 3. CNs 2-12 grossly intact. No facial droop noted.        Labs:  Lab Results Today:    Results for orders placed or performed during the hospital encounter of 09/04/18 (from the past 24 hour(s))   POC BLOOD GLUCOSE (RESULTS)   Result Value Ref Range    GLUCOSE, POC 207 (H) 70 - 110 mg/dl   POC BLOOD GLUCOSE (RESULTS)   Result Value Ref Range    GLUCOSE, POC 74 70 - 110 mg/dl   POC BLOOD GLUCOSE (RESULTS)   Result Value Ref Range    GLUCOSE, POC 166 (H) 70 - 110 mg/dl   POC BLOOD GLUCOSE (RESULTS)   Result Value Ref Range    GLUCOSE, POC 275 (H) 70 - 110 mg/dl     I have reviewed all labs.    Micro: No results found for any visits on 09/04/18 (from the past 96 hour(s)).    Radiology:       Assessment/ Plan:   Active Hospital Problems    Diagnosis   . Anasarca     Weakness and  deconditioning  -continue PT and OT    Hyponatremia  -on patient continues to be fluid overloaded  -continue fluid restriction  -continue to have nephrology following    Anasarca  -improved  -multifactorial likely due to possible liver cirrhosis and diastolic CHF  -status post Whipple procedure  -will need outpatient GI referral at discharge for right upper quadrant ultrasound showing hepatomegaly and minimal changes consistent with cirrhosis    Pancreatic cancer  -status post robotic surgery and chemo radiation  -stop chemo in July 2018 due to side effects  -follow up with Oncology as outpatient    Hypertension  -continue losartan, metoprolol, hydralazine    Recent PE  -continue Eliquis    Recent NSTEMI  -troponin peaked at 0.46  -follow up with Cardiology as an outpatient    DVT/PE Prophylaxis: Apixaban    Disposition Planning: Home discharge  and [place], FNP  09/09/2018, 19:55

## 2018-09-10 LAB — CBC
HCT: 20.7 % — ABNORMAL LOW (ref 34.6–46.2)
HGB: 7 g/dL — CL (ref 11.8–15.8)
MCH: 31.1 pg (ref 27.6–33.2)
MCHC: 33.8 g/dL (ref 32.6–35.4)
MCV: 92.2 fL (ref 82.3–96.7)
MPV: 10.2 fL (ref 6.6–10.2)
PLATELETS: 125 x10ˆ3/uL — ABNORMAL LOW (ref 140–440)
RBC: 2.25 x10ˆ6/uL — ABNORMAL LOW (ref 3.80–5.24)
RDW: 14.6 % (ref 12.4–15.2)
WBC: 2.9 x10ˆ3/uL — ABNORMAL LOW (ref 3.5–10.3)

## 2018-09-10 LAB — BASIC METABOLIC PANEL
ANION GAP: 3 mmol/L
BUN/CREA RATIO: 11
BUN: 9 mg/dL — ABNORMAL LOW (ref 10–25)
CALCIUM: 7.5 mg/dL — ABNORMAL LOW (ref 8.8–10.3)
CHLORIDE: 98 mmol/L (ref 98–111)
CO2 TOTAL: 26 mmol/L (ref 21–35)
CREATININE: 0.8 mg/dL (ref <=1.30)
CREATININE: 0.8 mg/dL (ref <=1.30)
ESTIMATED GFR: >60 mL/min/{1.73_m2}
GLUCOSE: 86 mg/dL (ref 70–110)
POTASSIUM: 4.8 mmol/L (ref 3.5–5.0)
POTASSIUM: 4.8 mmol/L (ref 3.5–5.0)
SODIUM: 127 mmol/L — ABNORMAL LOW (ref 135–145)

## 2018-09-10 LAB — MAGNESIUM: MAGNESIUM: 1.5 mg/dL — ABNORMAL LOW (ref 1.8–2.3)

## 2018-09-10 LAB — POC BLOOD GLUCOSE (RESULTS)
GLUCOSE, POC: 100 mg/dL (ref 70–110)
GLUCOSE, POC: 158 mg/dL — ABNORMAL HIGH (ref 70–110)
GLUCOSE, POC: 150 mg/dL — ABNORMAL HIGH (ref 70–110)
GLUCOSE, POC: 199 mg/dL — ABNORMAL HIGH (ref 70–110)

## 2018-09-10 LAB — PHOSPHORUS: PHOSPHORUS: 3.5 mg/dL (ref 2.5–4.5)

## 2018-09-10 MED ADMIN — hydrocortisone 1 %-pramoxine 1 % rectal foam: RECTAL | @ 18:00:00

## 2018-09-10 MED ADMIN — sodium chloride 0.9 % intravenous solution: INTRAVENOUS | @ 09:00:00

## 2018-09-10 MED ADMIN — PANTOPRAZOLE 80MG IN NS 100ML INTERMITTENT INFUSION: RECTAL | NDC 00008092351

## 2018-09-10 MED ADMIN — albumin, human 5 % intravenous solution: @ 21:00:00 | NDC 00944049101

## 2018-09-10 MED ADMIN — lactated Ringers intravenous solution: SUBCUTANEOUS | @ 12:00:00 | NDC 00338011704

## 2018-09-10 NOTE — OT Treatment (Signed)
Attempted to see patient for therapy this AM however she is feeling quite nauseous and reports having a bad temporal headache. Her hemoglobin is also low and patient is receiving blood. Agreed to attempt therapy after lunch if able.

## 2018-09-10 NOTE — Ancillary Notes (Signed)
Activity assessment and Section F of the MDS was completed on September 10, 2018.  Resident likes to be called [name].  She lives in [place] with her sister.  She does not drive a vehicle.  She is affiliated with the [employer].  She is registered to vote.  Resident worked at [employer].  She has two children and seven grandchildren.  Resident enjoys watching Westerns, working word puzzles, going out to eat, cooking and listening to music.  Activity calendar was reviewed and resident was told about the dayroom.

## 2018-09-10 NOTE — Nurses Notes (Signed)
Paged md about hemoglobin 7.0

## 2018-09-10 NOTE — OT Treatment (Signed)
[place]  Occupational Therapy Treatment Note    Patient Name: [name]  Date of Birth: [birth year]  Height:  152.4 cm (5')  Weight:  74.5 kg (164 lb 3.2 oz)  Room/Bed: 5121/A  Payor: MEDICARE / Plan: MEDICARE PART A AND B / Product Type: Medicare /     Date/Time of Admission: 09/04/2018  2:15 PM  Admitting Diagnosis:  Anasarca [R60.1]    The patient was seen this session for 23 total billable minutes.  The following procedures were performed this session Therapeutic Exercise, ea 15 min (97110) and Therapeutic Activity, ea 15 min (29924).      Subjective:     Patient with c/o 7/10 "all over" pain. States "I'm feeling better than I was this morning."      Objective:   Patient sitting on toilet upon arrival, call light on. Patient had small BM (diahrrea) and able to perform hygiene independently. Patient ambulated to sink and performed hand hygiene independently prior to walking back to recliner. Seated rest break required prior to therex this date. Patient completed green digiflex 3 x 10 reps as well as red flexbar for pronation  < > 3 x10 reps. Lastly completed 2# free weight exercises for chest press, bicep curls, and shoulder flexion 2 x 10 reps. Left in recliner with call bell and phone within reach.     Assessment:   Patient progressing well despite medical condition and poor activity tolerance    Plan:   Continue to follow according to established plan of care.    The risks/benefits of therapy have been discussed with the patient/caregiver and he/she is in agreement with the established plan of care.     Therapist:   Hubbard Robinson, OT,  09/10/2018  Treatment Time: 23 minutes

## 2018-09-10 NOTE — PT Treatment (Signed)
[facility]  Physical Therapy Treatment Note    Patient Name: [name]  Date of Birth: [birth year]  Height:  152.4 cm (5')  Weight:  74.5 kg (164 lb 3.2 oz)  Room/Bed: 5121/A  Payor: MEDICARE / Plan: MEDICARE PART A AND B / Product Type: Medicare /     Date/Time of Admission: 09/04/2018  2:15 PM  Admitting Diagnosis:  Anasarca [R60.1]    The patient was seen this session for 23 total billable minutes.  The following procedures were performed this session  Therapeutic Exercise, ea 15 min (82423).      Subjective:     Patient with c/o headache pain -- PT notified nsg (Lana) and pt had already been medicated for pain.      Objective:   Pt supine in bed, receiving blood transfusion.  Pt agreeable to bed therex.  Performs BLE supine TE x 20 reps to promote increased ROM/strength.  Resting break between each exercise.  Call bell in reach.     Assessment:   Pt tol therex well -- slight difficulty with SLR however pt able to perform bilaterally.     Plan:   Continue to follow according to established plan of care.    The risks/benefits of therapy have been discussed with the patient/caregiver and he/she is in agreement with the established plan of care.     Therapist:   Creta Levin, PT,  09/10/2018  Treatment Time In: 5361W  Treatment Time Out: 4315Q  Time may include review of medical chart, obtaining patient's functional history from patient/family/medical staff/case management/ancillary personnel, collaboration on findings and treatment options (with the above mentioned individuals), re-assessment, and acute care rehabilitation.

## 2018-09-10 NOTE — PT Treatment (Signed)
[facility]  Physical Therapy Treatment Note    Patient Name: [name]  Date of Birth: Nov 14, 1946  Height:  152.4 cm (5')  Weight:  74.5 kg (164 lb 3.2 oz)  Room/Bed: 5121/A  Payor: MEDICARE / Plan: MEDICARE PART A AND B / Product Type: Medicare /     Date/Time of Admission: 09/04/2018  2:15 PM  Admitting Diagnosis:  Anasarca [R60.1]    The patient was seen this session for 24 total billable minutes.  The following procedures were performed this session  Therapeutic Activity, ea 15 min (03474) and Gait Training, ea 15 min [phone]).      Subjective:     Patient with c/o headache and dizziness.      Objective:   Pt sitting in bedside chair at [place]. STS to [place] with SBA. Gait training with FWW and SBA with w/c follow 60' * 2. SPT w/c to bed with BUE and SBA/CGA. VC for hand placement for safety. Sit to supine with S. Pt positioned self in neutral in center of bed with trapeze bar and head of bed lowered. Pt resting in bed at EOS. Call bell in reach at PTA exit.     Assessment:       Plan:   Continue to follow according to established plan of care.    The risks/benefits of therapy have been discussed with the patient/caregiver and he/she is in agreement with the established plan of care.     Therapist:   Arlyce Dice, PTA,  09/10/2018  Treatment Time In: 225p  Treatment Time Out: 249p  Time may include review of medical chart, obtaining patient's functional history from patient/family/medical staff/case management/ancillary personnel, collaboration on findings and treatment options (with the above mentioned individuals), re-assessment, and acute care rehabilitation.

## 2018-09-11 LAB — CBC WITH DIFF
BASOPHIL #: 0.1 10*3/uL (ref 0.00–0.20)
BASOPHIL %: 5 %
EOSINOPHIL #: 0.1 10*3/uL (ref 0.00–0.50)
EOSINOPHIL %: 4 %
HCT: 29.5 % — ABNORMAL LOW (ref 34.6–46.2)
HGB: 10 g/dL — ABNORMAL LOW (ref 11.8–15.8)
LYMPHOCYTE #: 0.3 10*3/uL — ABNORMAL LOW (ref 0.90–3.40)
LYMPHOCYTE %: 17 %
MCH: 31.5 pg (ref 27.6–33.2)
MCHC: 33.8 g/dL (ref 32.6–35.4)
MCV: 93.3 fL (ref 82.3–96.7)
MONOCYTE #: 0.2 10*3/uL (ref 0.20–0.90)
MONOCYTE %: 10 %
MPV: 9.8 fL (ref 6.6–10.2)
NEUTROPHIL #: 1.3 10*3/uL — ABNORMAL LOW (ref 1.50–6.40)
NEUTROPHIL #: 1.3 10*3/uL — ABNORMAL LOW (ref 1.50–6.40)
NEUTROPHIL %: 65 %
PLATELETS: 113 10*3/uL — ABNORMAL LOW (ref 140–440)
RBC: 3.16 10*6/uL — ABNORMAL LOW (ref 3.80–5.24)
RDW: 15 % (ref 12.4–15.2)
WBC: 2 10*3/uL — ABNORMAL LOW (ref 3.5–10.3)

## 2018-09-11 LAB — CROSSMATCH RED CELLS - UNITS: UNIT DIVISION: 0

## 2018-09-11 LAB — CLOSTRIDIUM DIFFICILE TOXIN DETECTION: CLOSTRIDIUM DIFFICILE TOXIN DETECTION: NEGATIVE

## 2018-09-11 LAB — POC BLOOD GLUCOSE (RESULTS)
GLUCOSE, POC: 109 mg/dL (ref 70–110)
GLUCOSE, POC: 293 mg/dL — ABNORMAL HIGH (ref 70–110)
GLUCOSE, POC: 164 mg/dL — ABNORMAL HIGH (ref 70–110)
GLUCOSE, POC: 166 mg/dL — ABNORMAL HIGH (ref 70–110)

## 2018-09-11 LAB — TYPE AND SCREEN
ABO/RH(D): A POS
ANTIBODY SCREEN: NEGATIVE
UNITS ORDERED: 1
UNITS ORDERED: 1

## 2018-09-11 MED ADMIN — pantoprazole 40 mg tablet,delayed release: ORAL | @ 06:00:00

## 2018-09-11 MED ADMIN — dextrose 5 % and 0.45 % sodium chloride intravenous solution: @ 21:00:00 | NDC 00338008504

## 2018-09-11 MED ADMIN — sodium chloride 0.9 % (flush) injection syringe: TOPICAL | @ 14:00:00

## 2018-09-11 MED ADMIN — oxyCODONE 5 mg tablet: SUBCUTANEOUS | @ 11:00:00

## 2018-09-11 NOTE — PT Treatment (Signed)
[facility]  Physical Therapy Treatment Note    Patient Name: [name]  Date of Birth: 12-28-1945  Height:  152.4 cm (5')  Weight:  74.5 kg (164 lb 3.2 oz)  Room/Bed: 5121/A  Payor: MEDICARE / Plan: MEDICARE PART A AND B / Product Type: Medicare /     Date/Time of Admission: 09/04/2018  2:15 PM  Admitting Diagnosis:  Anasarca [R60.1]    The patient was seen this session for 25 total billable minutes.  The following procedures were performed this session  Therapeutic Exercise, ea 15 min (97110) and Therapeutic Activity, ea 15 min (25427).      Subjective:     Patient with c/o upset stomach and "freezing cold".      Objective:   Pt sitting in bedside chair at [place]. Pt reports she needed to use RR. Pt amb with FWW and SBA 25' * 2. Toilet transfers with [place] and S. Pt Ind for hygiene but dep for donning brief. Seated BLE therex 15 reps * 2. Pt resting in chair set up with lunch tray and call bell in reach at PTA exit.     Assessment:   Pt belching t/o session d/t upset stomach and nausea    Plan:   Continue to follow according to established plan of care.    The risks/benefits of therapy have been discussed with the patient/caregiver and he/she is in agreement with the established plan of care.     Therapist:   Arlyce Dice, PTA,  09/11/2018  Treatment Time In: 1116a  Treatment Time Out: 0623  Time may include review of medical chart, obtaining patient's functional history from patient/family/medical staff/case management/ancillary personnel, collaboration on findings and treatment options (with the above mentioned individuals), re-assessment, and acute care rehabilitation.

## 2018-09-11 NOTE — PT Treatment (Signed)
[facility]  Physical Therapy Treatment Note    Patient Name: [name]  Date of Birth: [birth year]  Height:  152.4 cm (5')  Weight:  74.5 kg (164 lb 3.2 oz)  Room/Bed: 5121/A  Payor: MEDICARE / Plan: MEDICARE PART A AND B / Product Type: Medicare /     Date/Time of Admission: 09/04/2018  2:15 PM  Admitting Diagnosis:  Anasarca [R60.1]    The patient was seen this session for 29 total billable minutes.  The following procedures were performed this session  Gait Training, ea 15 min [phone]).      Subjective:     Patient with c/o headache pain. Pt requested pain meds. Nursing notified.       Objective:   Pt supine in bed at SOS. Supine to sit with S. STS to FWW with SBA. Gait training with FWW and SBA with w/c follow 80' 100' and 120'. Pt returned to bed at EOS. Sit to supine with Min A for BLE Call bell in reach at PTA exit.     Assessment:       Plan:   Continue to follow according to established plan of care.    The risks/benefits of therapy have been discussed with the patient/caregiver and he/she is in agreement with the established plan of care.     Therapist:   Arlyce Dice, PTA,  09/11/2018  Treatment Time In: 121p  Treatment Time Out: 150p  Time may include review of medical chart, obtaining patient's functional history from patient/family/medical staff/case management/ancillary personnel, collaboration on findings and treatment options (with the above mentioned individuals), re-assessment, and acute care rehabilitation.

## 2018-09-11 NOTE — OT Treatment (Signed)
[facility]  Occupational Therapy Treatment Note    Patient Name: [name]  Date of Birth: 11-29-1946  Height:  152.4 cm (5')  Weight:  74.5 kg (164 lb 3.2 oz)  Room/Bed: 5121/A  Payor: MEDICARE / Plan: MEDICARE PART A AND B / Product Type: Medicare /     Date/Time of Admission: 09/04/2018  2:15 PM  Admitting Diagnosis:  Anasarca [R60.1]    The patient was seen this session for 38 total billable minutes.  The following procedures were performed this session ADL/Self Care, ea 15 min (37169).      Subjective:     Patient with c/o mild headache pain.      Objective:   Patient sitting in bed upon arrival and agreeable to OT, reports feeling better than yesterday. Patient mod I for bed mobility, 02 donned, ambulated into R/R and independent for hygiene, continent of urine. Patient then used FWW to ambulated into shower with CGA and completed bathing from a seated position with SUA/SPV to assist with handing items PRN. Patient did show slight fatigue throughout shower however was able to wash and dry her body in entirety with slight increased time. Patient donned overhead shirt with SUA and DEP for brief, min A for pants in standing position and increased time for socks with modified technique of sitting at EOB and bringing her legs up to the side in modified tailor sitting position. Patient ambulated to recliner with FWW and CGA, left in recliner with all needs met and call bell within reach.     Assessment:   Patient showed increased activity tolerance this date, able to tolerate shower with minimal fatigue.     Plan:   Continue to follow according to established plan of care.    The risks/benefits of therapy have been discussed with the patient/caregiver and he/she is in agreement with the established plan of care.     Therapist:   Hubbard Robinson, OT,  09/11/2018  Treatment Time: 38 minutes

## 2018-09-12 LAB — POC BLOOD GLUCOSE (RESULTS)
GLUCOSE, POC: 62 mg/dL — ABNORMAL LOW (ref 70–110)
GLUCOSE, POC: 228 mg/dL — ABNORMAL HIGH (ref 70–110)
GLUCOSE, POC: 165 mg/dL — ABNORMAL HIGH (ref 70–110)
GLUCOSE, POC: 115 mg/dL — ABNORMAL HIGH (ref 70–110)
GLUCOSE, POC: 115 mg/dl — ABNORMAL HIGH (ref 70–110)

## 2018-09-12 MED ADMIN — HYDROcodone 7.5 mg-acetaminophen 325 mg/15 mL oral solution: TOPICAL | @ 09:00:00

## 2018-09-12 MED ADMIN — sodium chloride 0.9 % (flush) injection syringe: SUBCUTANEOUS | @ 18:00:00

## 2018-09-12 MED ADMIN — sodium chloride 0.9 % intravenous solution: ORAL | @ 09:00:00 | NDC 00338004904

## 2018-09-12 NOTE — OT Treatment (Signed)
[place] [facility]  Occupational Therapy Treatment Note    Patient Name: [name]  Date of Birth: 1946/08/13  Height:  152.4 cm (5')  Weight:  71.2 kg (157 lb)  Room/Bed: 5121/A  Payor: MEDICARE / Plan: MEDICARE PART A AND B / Product Type: Medicare /     Date/Time of Admission: 09/04/2018  2:15 PM  Admitting Diagnosis:  Anasarca [R60.1]    The patient was seen this session for 31 total billable minutes.  The following procedures were performed this session Therapeutic Exercise, ea 15 min (66440).      Subjective:     Patient with c/o 4/10 headache pain. Reports "I'm always cold and my head has been hurting a lot recently."      Objective:   Patient sitting in bed upon arrival and agreeable to OT. Mod I for bed mobility and stood at [facility] with SPV and ambulated to recliner. Patient completed 2# free weight exercises for chest press, shoulder press, bicep curl and forward circumduction 2 x 10 reps. Also completed red theraband exercises for chest pull, chest press, shoulder abduction, tricep extension and elbow flexion 3 x 10 reps. Lastly performed red flexbar exercises for pronation < > supination 3 x 10 reps. Requested to return to bed and left in supine with call bell and phone within reach.      Assessment:   Patient progressing well, endurance improving. Headache pain still a barrier at times.    Plan:   Continue to follow according to established plan of care.    The risks/benefits of therapy have been discussed with the patient/caregiver and he/she is in agreement with the established plan of care.     Therapist:   Hubbard Robinson, OT,  09/12/2018  Treatment Time: 31 minutes

## 2018-09-12 NOTE — PT Treatment (Signed)
[facility]  Physical Therapy Treatment Note    Patient Name: [name]  Date of Birth: Jun 21, 1946  Height:  152.4 cm (5')  Weight:  71.2 kg (157 lb)  Room/Bed: 5121/A  Payor: MEDICARE / Plan: MEDICARE PART A AND B / Product Type: Medicare /     Date/Time of Admission: 09/04/2018  2:15 PM  Admitting Diagnosis:  Anasarca [R60.1]    The patient was seen this session for 28 total billable minutes.  The following procedures were performed this session  Therapeutic Exercise, ea 15 min (97110) and Gait Training, ea 15 min [phone]).      Subjective:     Patient with c/o headache pain. Pt states "it has gotten a little better."       Objective:   Pt supine in bed at SOS. SUpine to sit with S. STS to FWW with S. Gait training to bathroom. Pt indep with hygiene of urine but min A for brief change. Gait training with FWW and SBA * 300' without rest. Seated BLE therex * 15 reps each. Monitored pts O2 on RA t/o session. Pt remained 94-100% and above with activity on room air. Pt returned to bed at EOS. Sit to supine with Min A for BLE. Call bell in reach at PTA exit.     Assessment:   Pt tolerated tx on RA well. Pt improved ambulatory distance this date.     Plan:   Continue to follow according to established plan of care.    The risks/benefits of therapy have been discussed with the patient/caregiver and he/she is in agreement with the established plan of care.     Therapist:   Arlyce Dice, PTA,  09/12/2018  Treatment Time In: 84p  Treatment Time Out: 400p  Time may include review of medical chart, obtaining patient's functional history from patient/family/medical staff/case management/ancillary personnel, collaboration on findings and treatment options (with the above mentioned individuals), re-assessment, and acute care rehabilitation.

## 2018-09-12 NOTE — PT Treatment (Signed)
[facility]  Physical Therapy Treatment Note    Patient Name: [name]  Date of Birth: [birth year]  Height:  152.4 cm (5')  Weight:  71.2 kg (157 lb)  Room/Bed: 5121/A  Payor: MEDICARE / Plan: MEDICARE PART A AND B / Product Type: Medicare /     Date/Time of Admission: 09/04/2018  2:15 PM  Admitting Diagnosis:  Anasarca [R60.1]    The patient was seen this session for 23 total billable minutes.  The following procedures were performed this session  Gait Training, ea 15 min [phone]).      Subjective:     Patient continues to c/o headache and nausea. Pt does not understand why head is continuing to hurt.       Objective:   Pt sitting in bedside chair at [place]. STS to [place] with SBA. Gait training with FWW and SBA with w/c follow * 300' with on seated rest break. Pt on 1L O2 t/o session. VC for pursed lipped breathing. Pt resting in chair at EOS. Call bell in reach at PTA exit.     Assessment:   Pt improved ambulatory distance this date.     Plan:   Continue to follow according to established plan of care.    The risks/benefits of therapy have been discussed with the patient/caregiver and he/she is in agreement with the established plan of care.     Therapist:   Arlyce Dice, PTA,  09/12/2018  Treatment Time In: 3254D  Treatment Time Out: 8264B  Time may include review of medical chart, obtaining patient's functional history from patient/family/medical staff/case management/ancillary personnel, collaboration on findings and treatment options (with the above mentioned individuals), re-assessment, and acute care rehabilitation.

## 2018-09-13 LAB — BASIC METABOLIC PANEL
ANION GAP: 1 mmol/L
BUN/CREA RATIO: 12
BUN: 9 mg/dL — ABNORMAL LOW (ref 10–25)
CALCIUM: 7.7 mg/dL — ABNORMAL LOW (ref 8.8–10.3)
CHLORIDE: 98 mmol/L (ref 98–111)
CO2 TOTAL: 29 mmol/L (ref 21–35)
CREATININE: 0.75 mg/dL (ref <=1.30)
ESTIMATED GFR: >60 mL/min/{1.73_m2}
GLUCOSE: 101 mg/dL (ref 70–110)
POTASSIUM: 4.8 mmol/L (ref 3.5–5.0)
POTASSIUM: 4.8 mmol/L (ref 3.5–5.0)
SODIUM: 128 mmol/L — ABNORMAL LOW (ref 135–145)

## 2018-09-13 LAB — POC BLOOD GLUCOSE (RESULTS)
GLUCOSE, POC: 114 mg/dL — ABNORMAL HIGH (ref 70–110)
GLUCOSE, POC: 150 mg/dL — ABNORMAL HIGH (ref 70–110)
GLUCOSE, POC: 117 mg/dL — ABNORMAL HIGH (ref 70–110)
GLUCOSE, POC: 226 mg/dL — ABNORMAL HIGH (ref 70–110)

## 2018-09-13 LAB — CBC
HCT: 27.9 % — ABNORMAL LOW (ref 34.6–46.2)
HGB: 9.4 g/dL — ABNORMAL LOW (ref 11.8–15.8)
MCH: 31.2 pg (ref 27.6–33.2)
MCHC: 33.7 g/dL (ref 32.6–35.4)
MCV: 92.6 fL (ref 82.3–96.7)
MPV: 9.8 fL (ref 6.6–10.2)
PLATELETS: 99 10*3/uL — ABNORMAL LOW (ref 140–440)
RBC: 3.01 10*6/uL — ABNORMAL LOW (ref 3.80–5.24)
RDW: 15 % (ref 12.4–15.2)
WBC: 3 10*3/uL — ABNORMAL LOW (ref 3.5–10.3)

## 2018-09-13 LAB — MAGNESIUM: MAGNESIUM: 1.4 mg/dL — ABNORMAL LOW (ref 1.8–2.3)

## 2018-09-13 LAB — PHOSPHORUS: PHOSPHORUS: 3.8 mg/dL (ref 2.5–4.5)

## 2018-09-13 MED ORDER — LOPERAMIDE 2 MG CAPSULE
2.00 mg | ORAL_CAPSULE | ORAL | Status: DC | PRN
Start: 2018-09-13 — End: 2018-09-25
  Filled 2018-09-13: qty 1

## 2018-09-13 MED ADMIN — sodium chloride 0.9 % (flush) injection syringe: ORAL | @ 20:00:00

## 2018-09-13 MED ADMIN — nystatin 100,000 unit/gram topical cream: ORAL | @ 09:00:00 | NDC 51672128901

## 2018-09-13 MED ADMIN — VANCOMYCIN IVPB IN 100ML - 10G VIAL PREP: ORAL | @ 09:00:00

## 2018-09-13 NOTE — Care Plan (Signed)
Weight Change Nutrition Analysis    [place], [name] 5121  12 lb/7.1% wt loss x 1 week.  Regular diet with 1000 mL Fluid Restriction (pt's Na+ remains decreased, see below), Mighty Shake 1/day with breakfast. PO intake is good.   Pt with anasarca dx, BLE edema, pt on Lasix 40 mg.   Continue current diet, supplement and fluid restriction. RD available as needed.   09/13/18 0700 71.4 kg (157 lb 8 oz) Bed 0.3 % SK   09/12/18 0300 71.2 kg (157 lb) Bed -4.4 % TT   09/10/18 0630 74.5 kg (164 lb 3.2 oz) Bed 2.4 % NM   09/07/18 2300 72.8 kg (160 lb 6.4 oz) Bed -5.4 % TT   09/04/18 1700 76.9 kg (169 lb 9.6 oz) -- 0 % RM   09/04/18 1300 76.9 kg (169 lb 9.6 oz) -- -0.7 % CB     Results for [name], [name] (MRN [id number]) as of 09/13/2018 17:27   Ref. Range 09/04/2018 16:10 09/05/2018 06:05 09/06/2018 06:18 09/08/2018 15:47 09/10/2018 05:09 09/13/2018 05:50   SODIUM Latest Ref Range: 135 - 145 mmol/L 127 (L) 128 (L) 125 (L) 127 (L) 127 (L) 128 (L)       Sabriah Hobbins, RDLD  09/13/2018, 17:26  Pager [phone]

## 2018-09-13 NOTE — OT Treatment (Signed)
[place] [facility]  Occupational Therapy Treatment Note    Patient Name: [name]  Date of Birth: [birth year]  Height:  152.4 cm (5')  Weight:  71.4 kg (157 lb 8 oz)  Room/Bed: 5121/A  Payor: MEDICARE / Plan: MEDICARE PART A AND B / Product Type: Medicare /     Date/Time of Admission: 09/04/2018  2:15 PM  Admitting Diagnosis:  Anasarca [R60.1]    The patient was seen this session for 23 total billable minutes.  The following procedures were performed this session Therapeutic Exercise, ea 15 min (87564).      Subjective:     Patient with c/o no pain.      Objective:   Patient sitting in recliner upon arrival and agreeable to OT. Patient reports falling and hitting her R elbow this morning in the bathroom but isn't experiencing any pain. Patient completed 2# free wt exercises for chest press, shoulder press, bicep curl and forward/backward circumduction 2 x 10 reps. Also completed green digiflex and red flexbar exercises for pronation < > supination 3 x 10 reps. Left in recliner with call bell and chair alarm activated.       Assessment:   Patient endurance is improving, requiring less breaks during therex this date.     Plan:   Continue to follow according to established plan of care.    The risks/benefits of therapy have been discussed with the patient/caregiver and he/she is in agreement with the established plan of care.     Therapist:   Hubbard Robinson, OT,  09/13/2018  Treatment Time: 23 minutes

## 2018-09-13 NOTE — Nurses Notes (Signed)
(-)   CDiff stool 09/13/2018     (+)VRE urine culture 09/03/18.  Continue Contact isolation through duration of admission.

## 2018-09-13 NOTE — Nurses Notes (Signed)
Called to patients room patient fell in bathroom.  PCA reports that she had taken patient to the bathroom and came out of room and heard patient yell.  Upon reentering room found patient to be sitting in floor in front of toilet.  Patient states that she stood up to pull up her pants and lost balance and went to floor.  Did state she hit left elbow however no injury noted. Called and notified K. Snodgrass NP and order entered for bed/chair alarms.   Called and notified son of fall via message. Ladora Daniel, RN

## 2018-09-13 NOTE — Progress Notes (Signed)
Hospitalist   Progress Note    [name]       72 y.o.       Date of service: 09/13/2018  Date of Admission:  09/04/2018    Hospital Day:  LOS: 9 days   CC: No chief complaint on file.      Subjective:  Patient lying in bed in no acute distress.  Voices no complaints or concerns.  Denies nausea or vomiting continues to have diarrhea-C diff was negative.    Objective:   Filed Vitals:    09/12/18 0500 09/12/18 1700 09/13/18 0700 09/13/18 1700   BP: (!) 150/84 (!) 146/55 (!) 170/40 (!) 130/48   Pulse: 66 61 80 79   Resp: 18 20 20 20    Temp: 36.4 C (97.5 F) 36.7 C (98.1 F) 36.7 C (98.1 F) 36.9 C (98.4 F)   SpO2: 100% 100% 100% 100%     I have reviewed the vitals.    Physical Exam:  Constitutional: Patient is in no acute distress.  Appears chronically ill  HEENT: Head normocephalic and atraumatic.   Lungs: Clear to auscultation bilaterally without wheezes, rales or rhonchi.  Cardiovascular: Regular rate and rhythm. No murmur, rub, or gallop.   Abdomen: Normal bowel sounds. Soft, nontender, nondistended.   MSK/Extremities: Moves all extremities. Pulses equal bilaterally.  Trace bilateral lower extremity edema  Skin: No lesions noted.  No rashes.  Neuro: Alert and oriented X 3. CNs 2-12 grossly intact. No facial droop noted.        Labs:  Lab Results Today:    Results for orders placed or performed during the hospital encounter of 09/04/18 (from the past 24 hour(s))   POC BLOOD GLUCOSE (RESULTS)   Result Value Ref Range    GLUCOSE, POC 115 (H) 70 - 110 mg/dl   POC BLOOD GLUCOSE (RESULTS)   Result Value Ref Range    GLUCOSE, POC 114 (H) 70 - 110 mg/dl   BASIC METABOLIC PANEL   Result Value Ref Range    SODIUM 128 (L) 135 - 145 mmol/L    POTASSIUM 4.8 3.5 - 5.0 mmol/L    CHLORIDE 98 98 - 111 mmol/L    CO2 TOTAL 29 21 - 35 mmol/L    ANION GAP 1 mmol/L    CALCIUM 7.7 (L) 8.8 - 10.3 mg/dL    GLUCOSE 101 70 - 110 mg/dL    BUN 9 (L) 10 - 25 mg/dL    CREATININE 0.75 <=1.30 mg/dL    BUN/CREA RATIO 12     ESTIMATED  GFR >60 Avg: 75 mL/min/1.41m^2   CBC   Result Value Ref Range    WBC 3.0 (L) 3.5 - 10.3 x10^3/uL    RBC 3.01 (L) 3.80 - 5.24 x10^6/uL    HGB 9.4 (L) 11.8 - 15.8 g/dL    HCT 27.9 (L) 34.6 - 46.2 %    MCV 92.6 82.3 - 96.7 fL    MCH 31.2 27.6 - 33.2 pg    MCHC 33.7 32.6 - 35.4 g/dL    RDW 15.0 12.4 - 15.2 %    PLATELETS 99 (L) 140 - 440 x10^3/uL    MPV 9.8 6.6 - 10.2 fL   MAGNESIUM   Result Value Ref Range    MAGNESIUM 1.4 (L) 1.8 - 2.3 mg/dL   PHOSPHORUS   Result Value Ref Range    PHOSPHORUS 3.8 2.5 - 4.5 mg/dL   POC BLOOD GLUCOSE (RESULTS)   Result Value Ref Range    GLUCOSE,  POC 150 (H) 70 - 110 mg/dl   POC BLOOD GLUCOSE (RESULTS)   Result Value Ref Range    GLUCOSE, POC 117 (H) 70 - 110 mg/dl     I have reviewed all labs.    Micro:   Hospital Encounter on 09/04/18 (from the past 96 hour(s))   CLOSTRIDIUM DIFFICILE TOXIN DETECTION    Collection Time: 09/11/18  5:05 PM   Culture Result Status    CLOSTRIDIUM DIFFICILE TOXIN DETECTION Negative Final       Radiology:       Assessment/ Plan:   Active Hospital Problems    Diagnosis   . Anasarca     Weakness and deconditioning  -continue PT and OT    Hyponatremia  -stable  -continue fluid restriction  -continue to have nephrology following    Anasarca  -improved  -multifactorial likely due to possible liver cirrhosis and diastolic CHF  -status post Whipple procedure  -will need outpatient GI referral at discharge for right upper quadrant ultrasound showing hepatomegaly and minimal changes consistent with cirrhosis    Pancreatic cancer  -status post robotic surgery and chemo radiation  -stop chemo in July 2018 due to side effects  -follow up with Oncology as outpatient    Hypertension  -continue losartan, metoprolol, hydralazine    Recent PE  -continue Eliquis    Recent NSTEMI  -troponin peaked at 0.46  -follow up with Cardiology as an outpatient    DVT/PE Prophylaxis: Apixaban    Disposition Planning: Home discharge  and [place], FNP-BC   09/13/2018, 19:09

## 2018-09-13 NOTE — PT Treatment (Signed)
[place]  Physical Therapy Treatment Note    Patient Name: [name]  Date of Birth: [birth year]  Height:  152.4 cm (5')  Weight:  71.4 kg (157 lb 8 oz)  Room/Bed: 5121/A  Payor: MEDICARE / Plan: MEDICARE PART A AND B / Product Type: Medicare /     Date/Time of Admission: 09/04/2018  2:15 PM  Admitting Diagnosis:  Anasarca [R60.1]    The patient was seen this session for 26 total billable minutes.  The following procedures were performed this session  Therapeutic Exercise, ea 15 min (97110) and Gait Training, ea 15 min [phone]).      Subjective:     Patient with c/o no pain. Pt reports she slept much better last night and does not remember eating dinner.       Objective:   Pt sitting in bedside chair at [place]. Pt had fall in bathroom this AM. Pt reports PCA took her to bathroom and left to pick up lunch tray and when pt bent over to pull up she fell. Pt reports hitting her elbow and scraping second and third R fingers. Pt denies any pain at present. Gait training with FWW and SBA with w/c follow * 125' and 175' on room air. Pt remains at 96% and above on room air with activity. Seated BLE therex * 15 reps each. Pt resting in chair at EOS. Call bell in reach at PTA exit.     Assessment:   Pt able to maintain saturation level 96% and above on room.     Plan:   Continue to follow according to established plan of care.    The risks/benefits of therapy have been discussed with the patient/caregiver and he/she is in agreement with the established plan of care.     Therapist:   Arlyce Dice, PTA,  09/13/2018  Treatment Time In: 5681E  Treatment Time Out: 7517G  Time may include review of medical chart, obtaining patient's functional history from patient/family/medical staff/case management/ancillary personnel, collaboration on findings and treatment options (with the above mentioned individuals), re-assessment, and acute care rehabilitation.

## 2018-09-13 NOTE — PT Treatment (Signed)
[place]  Physical Therapy Treatment Note    Patient Name: [name]  Date of Birth: [birth year]  Height:  152.4 cm (5')  Weight:  71.4 kg (157 lb 8 oz)  Room/Bed: 5121/A  Payor: MEDICARE / Plan: MEDICARE PART A AND B / Product Type: Medicare /     Date/Time of Admission: 09/04/2018  2:15 PM  Admitting Diagnosis:  Anasarca [R60.1]    The patient was seen this session for 23 total billable minutes.  The following procedures were performed this session  Therapeutic Activity, ea 15 min (16967) and Gait Training, ea 15 min [phone]).      Subjective:     Patient continues to report headache pain. Pt states "I wish they would tell me why my head keeps hurting." Pt also reports "freezing cold, especially my hands."      Objective:   Pt supine in bed at SOS. Supine to sit with Mod I. STSs * 5 reps to FWW with SBA and VC for hand placement. Pt reports dizziness with initial standing. Gait training iwht FWW and SBA * 300' with one standing rest break. Pt amb at slow pace. Pt given scrub pants, 2 warm blankets, and a pair of gloves as per request for warmth. Sit to supine with Min A for BLE. Pt resting in bed and call bell in reach at PTA exit.     Assessment:       Plan:   Continue to follow according to established plan of care.    The risks/benefits of therapy have been discussed with the patient/caregiver and he/she is in agreement with the established plan of care.     Therapist:   Arlyce Dice, PTA,  09/13/2018  Treatment Time In: 125p  Treatment Time Out: 148p  Time may include review of medical chart, obtaining patient's functional history from patient/family/medical staff/case management/ancillary personnel, collaboration on findings and treatment options (with the above mentioned individuals), re-assessment, and acute care rehabilitation.

## 2018-09-14 LAB — SCAN DIFFERENTIAL
PLATELET ESTIMATE: DECREASED
RBC MORPHOLOGY COMMENT: NORMAL
WBC MORPHOLOGY COMMENT: NORMAL

## 2018-09-14 LAB — CBC WITH DIFF
BASOPHIL #: 0.1 x10ˆ3/uL (ref 0.00–0.20)
BASOPHIL %: 3 %
EOSINOPHIL #: 0.1 x10ˆ3/uL (ref 0.00–0.50)
EOSINOPHIL %: 4 %
HCT: 25.2 % — ABNORMAL LOW (ref 34.6–46.2)
HGB: 8.4 g/dL — ABNORMAL LOW (ref 11.8–15.8)
LYMPHOCYTE #: 0.4 x10?3/uL — ABNORMAL LOW (ref 0.90–3.40)
LYMPHOCYTE #: 0.4 x10ˆ3/uL — ABNORMAL LOW (ref 0.90–3.40)
LYMPHOCYTE %: 25 %
MCH: 30.7 pg (ref 27.6–33.2)
MCHC: 33.2 g/dL (ref 32.6–35.4)
MCV: 92.5 fL (ref 82.3–96.7)
MONOCYTE #: 0.3 10*3/uL (ref 0.20–0.90)
MONOCYTE %: 19 %
MPV: 10.5 fL — ABNORMAL HIGH (ref 6.6–10.2)
NEUTROPHIL #: 0.8 x10ˆ3/uL — ABNORMAL LOW (ref 1.50–6.40)
NEUTROPHIL %: 49 %
PLATELETS: 88 x10ˆ3/uL — ABNORMAL LOW (ref 140–440)
RBC: 2.72 10*6/uL — ABNORMAL LOW (ref 3.80–5.24)
RBC: 2.72 10*6/uL — ABNORMAL LOW (ref 3.80–5.24)
RDW: 15.1 % (ref 12.4–15.2)
WBC: 1.6 x10ˆ3/uL — ABNORMAL LOW (ref 3.5–10.3)

## 2018-09-14 LAB — POC BLOOD GLUCOSE (RESULTS)
GLUCOSE, POC: 102 mg/dL (ref 70–110)
GLUCOSE, POC: 189 mg/dL — ABNORMAL HIGH (ref 70–110)
GLUCOSE, POC: 109 mg/dL (ref 70–110)
GLUCOSE, POC: 109 mg/dl (ref 70–110)
GLUCOSE, POC: 289 mg/dL — ABNORMAL HIGH (ref 70–110)

## 2018-09-14 LAB — CBC
HCT: 24.7 % — ABNORMAL LOW (ref 34.6–46.2)
HGB: 8.6 g/dL — ABNORMAL LOW (ref 11.8–15.8)
MCH: 32.1 pg (ref 27.6–33.2)
MCHC: 34.8 g/dL (ref 32.6–35.4)
MCV: 92.2 fL (ref 82.3–96.7)
MPV: 9.9 fL (ref 6.6–10.2)
PLATELETS: 80 x10ˆ3/uL — ABNORMAL LOW (ref 140–440)
RBC: 2.68 x10ˆ6/uL — ABNORMAL LOW (ref 3.80–5.24)
RDW: 14.7 % (ref 12.4–15.2)
WBC: 1.1 10*3/uL — ABNORMAL LOW (ref 3.5–10.3)

## 2018-09-14 LAB — CREATININE WITH EGFR
CREATININE: 0.75 mg/dL (ref <=1.30)
ESTIMATED GFR: >60 mL/min/1.73mˆ2

## 2018-09-14 MED ORDER — SODIUM CHLORIDE 0.9 % (FLUSH) INJECTION SYRINGE
10.00 mL | INJECTION | Freq: Three times a day (TID) | INTRAMUSCULAR | Status: DC
Start: 2018-09-14 — End: 2018-09-25
  Administered 2018-09-14 – 2018-09-21 (×23): 10 mL
  Administered 2018-09-22: 06:00:00 0 mL
  Administered 2018-09-22 – 2018-09-23 (×5): 10 mL
  Administered 2018-09-24: 14:00:00 0 mL
  Administered 2018-09-24 – 2018-09-25 (×3): 10 mL

## 2018-09-14 MED ORDER — APIXABAN 2.5 MG TABLET
2.50 mg | ORAL_TABLET | Freq: Two times a day (BID) | ORAL | Status: DC
Start: 2018-09-14 — End: 2018-09-25
  Administered 2018-09-14 – 2018-09-25 (×23): 2.5 mg via ORAL
  Filled 2018-09-14 (×27): qty 1

## 2018-09-14 MED ORDER — SODIUM CHLORIDE 0.9 % (FLUSH) INJECTION SYRINGE
10.00 mL | INJECTION | INTRAMUSCULAR | Status: DC | PRN
Start: 2018-09-14 — End: 2018-09-25

## 2018-09-14 MED ADMIN — hydrALAZINE 10 mg tablet: ORAL | @ 14:00:00

## 2018-09-14 MED ADMIN — oxyCODONE-acetaminophen 5 mg-325 mg tablet: ORAL | @ 22:00:00

## 2018-09-14 MED ADMIN — sodium chloride 0.45 % intravenous solution: RECTAL

## 2018-09-14 NOTE — OT Treatment (Signed)
[place]  Occupational Therapy Treatment Note    Patient Name: [name]  Date of Birth: 05-26-1946  Height:  152.4 cm (5')  Weight:  71.4 kg (157 lb 8 oz)  Room/Bed: 5121/A  Payor: MEDICARE / Plan: MEDICARE PART A AND B / Product Type: Medicare /     Date/Time of Admission: 09/04/2018  2:15 PM  Admitting Diagnosis:  Anasarca [R60.1]    The patient was seen this session for 33 total billable minutes.  The following procedures were performed this session ADL/Self Care, ea 15 min (00511).      Subjective:     Patient with c/o no pain.      Objective:   Patient in shower upon arrival, PCA had previously told therapist she would wait until therapist arrived to work on LB dressing and bathing. Patient completed bathing with min A for hair to have patient hold washcloth over port area as the water proof barrier was beginning to come off. Patient able to dry herself and ambulated to bedside to dress per her request. Patient able to don overhead shirt with SUA, as well as brief, pants and socks with increased time at EOB with adaptive techniques. Patient then ambulated to recliner and left with call bell and phone within reach.     Assessment:   Patient did well today with ADL session, mild fatigue noted at end of session but overall improvement since last ADL.     Plan:   Continue to follow according to established plan of care.    The risks/benefits of therapy have been discussed with the patient/caregiver and he/she is in agreement with the established plan of care.     Therapist:   Hubbard Robinson, OT,  09/14/2018  Treatment Time: 33 minutes

## 2018-09-14 NOTE — PT Treatment (Signed)
[place]  Physical Therapy Treatment Note    Patient Name: [name]  Date of Birth: Mar 27, 1946  Height:  152.4 cm (5')  Weight:  71.4 kg (157 lb 8 oz)  Room/Bed: 5121/A  Payor: MEDICARE / Plan: MEDICARE PART A AND B / Product Type: Medicare /     Date/Time of Admission: 09/04/2018  2:15 PM  Admitting Diagnosis:  Anasarca [R60.1]    The patient was seen this session for 23 total billable minutes.  The following procedures were performed this session  Therapeutic Exercise, ea 15 min (97110) and Gait Training, ea 15 min [phone]).      Subjective:     Patient with c/o no pain.      Objective:   Pt sitting in beside chair at [place]. STS to [place] with S. Gait training with FWW and SBA * 150' and 200' . Seated BLE Therex * 10 reps each.Monitored pt's O2 t/o session. Pt remained 97% and above on room air. Pt resting in chair at EOS. Call bell in reach at PTA exit.     Assessment:   Pt O2 sats remain 93% and above on room air    Plan:   Continue to follow according to established plan of care.    The risks/benefits of therapy have been discussed with the patient/caregiver and he/she is in agreement with the established plan of care.     Therapist:   Arlyce Dice, PTA,  09/14/2018  Treatment Time In: 1111a  Treatment Time Out: 9407  Time may include review of medical chart, obtaining patient's functional history from patient/family/medical staff/case management/ancillary personnel, collaboration on findings and treatment options (with the above mentioned individuals), re-assessment, and acute care rehabilitation.

## 2018-09-14 NOTE — Ancillary Notes (Signed)
Activity assessment and Section F of the MDS was completed and put in Epic on Friday, September 14, 2018.  Resident likes to be called [name].  She lives in [place].  Resident drives a vehicle.  She is affiliated with the [employer].  She is registered to vote.  Resident worked as an Surveyor, minerals.  Resident enjoys gardening, going out to eat, spending time with her cat, working word search puzzles, watching the [employer] and watching [place] football.  Activity calendar was reviewed and resident was told about the dayroom.

## 2018-09-15 LAB — POC BLOOD GLUCOSE (RESULTS)
GLUCOSE, POC: 95 mg/dL (ref 70–110)
GLUCOSE, POC: 252 mg/dL — ABNORMAL HIGH (ref 70–110)
GLUCOSE, POC: 216 mg/dL — ABNORMAL HIGH (ref 70–110)
GLUCOSE, POC: 201 mg/dL — ABNORMAL HIGH (ref 70–110)

## 2018-09-15 LAB — CBC
HCT: 25.2 % — ABNORMAL LOW (ref 34.6–46.2)
HGB: 8.6 g/dL — ABNORMAL LOW (ref 11.8–15.8)
MCH: 31.5 pg (ref 27.6–33.2)
MCHC: 34.2 g/dL (ref 32.6–35.4)
MCV: 92.2 fL (ref 82.3–96.7)
MPV: 10.2 fL (ref 6.6–10.2)
PLATELETS: 77 x10ˆ3/uL — ABNORMAL LOW (ref 140–440)
RBC: 2.74 x10ˆ6/uL — ABNORMAL LOW (ref 3.80–5.24)
RDW: 14.3 % (ref 12.4–15.2)
WBC: 1.8 x10ˆ3/uL — ABNORMAL LOW (ref 3.5–10.3)

## 2018-09-15 MED ADMIN — hydrocortisone 1 %-pramoxine 1 % rectal foam: RECTAL | @ 06:00:00

## 2018-09-15 MED ADMIN — insulin lispro 100 unit/mL subcutaneous solution: TOPICAL | @ 14:00:00

## 2018-09-15 MED ADMIN — phenylephrine 10 mg/mL injection solution: ORAL | @ 08:00:00

## 2018-09-16 LAB — CBC
HCT: 25.5 % — ABNORMAL LOW (ref 34.6–46.2)
HGB: 8.6 g/dL — ABNORMAL LOW (ref 11.8–15.8)
MCH: 30.9 pg (ref 27.6–33.2)
MCHC: 33.9 g/dL (ref 32.6–35.4)
MCV: 91.2 fL (ref 82.3–96.7)
MPV: 9.9 fL (ref 6.6–10.2)
PLATELETS: 75 x10ˆ3/uL — ABNORMAL LOW (ref 140–440)
RBC: 2.79 x10ˆ6/uL — ABNORMAL LOW (ref 3.80–5.24)
RDW: 14.4 % (ref 12.4–15.2)
WBC: 1.9 x10ˆ3/uL — ABNORMAL LOW (ref 3.5–10.3)

## 2018-09-16 LAB — POC BLOOD GLUCOSE (RESULTS)
GLUCOSE, POC: 94 mg/dL (ref 70–110)
GLUCOSE, POC: 268 mg/dL — ABNORMAL HIGH (ref 70–110)
GLUCOSE, POC: 251 mg/dL — ABNORMAL HIGH (ref 70–110)
GLUCOSE, POC: 109 mg/dL (ref 70–110)

## 2018-09-16 MED ADMIN — nystatin 100,000 unit/gram topical powder: @ 20:00:00 | NDC 00574200815

## 2018-09-16 MED ADMIN — sodium chloride 0.9 % (flush) injection syringe: ORAL | @ 08:00:00

## 2018-09-16 MED ADMIN — nystatin 100,000 unit/gram topical powder: TOPICAL | @ 08:00:00 | NDC 00574200815

## 2018-09-17 LAB — MAGNESIUM: MAGNESIUM: 1.4 mg/dL — ABNORMAL LOW (ref 1.8–2.3)

## 2018-09-17 LAB — BASIC METABOLIC PANEL
ANION GAP: 3 mmol/L
BUN/CREA RATIO: 13
BUN: 10 mg/dL (ref 10–25)
CALCIUM: 7.3 mg/dL — ABNORMAL LOW (ref 8.8–10.3)
CHLORIDE: 99 mmol/L (ref 98–111)
CO2 TOTAL: 27 mmol/L (ref 21–35)
CREATININE: 0.79 mg/dL (ref <=1.30)
ESTIMATED GFR: >60 mL/min/{1.73_m2}
GLUCOSE: 129 mg/dL — ABNORMAL HIGH (ref 70–110)
POTASSIUM: 4.4 mmol/L (ref 3.5–5.0)
SODIUM: 129 mmol/L — ABNORMAL LOW (ref 135–145)

## 2018-09-17 LAB — POC BLOOD GLUCOSE (RESULTS)
GLUCOSE, POC: 141 mg/dL — ABNORMAL HIGH (ref 70–110)
GLUCOSE, POC: 306 mg/dL — ABNORMAL HIGH (ref 70–110)
GLUCOSE, POC: 85 mg/dL (ref 70–110)
GLUCOSE, POC: 215 mg/dL — ABNORMAL HIGH (ref 70–110)

## 2018-09-17 LAB — CBC
HCT: 25.7 % — ABNORMAL LOW (ref 34.6–46.2)
HGB: 8.8 g/dL — ABNORMAL LOW (ref 11.8–15.8)
MCH: 31.1 pg (ref 27.6–33.2)
MCHC: 34.2 g/dL (ref 32.6–35.4)
MCV: 90.9 fL (ref 82.3–96.7)
MPV: 10.4 fL — ABNORMAL HIGH (ref 6.6–10.2)
PLATELETS: 70 x10ˆ3/uL — ABNORMAL LOW (ref 140–440)
RBC: 2.83 x10ˆ6/uL — ABNORMAL LOW (ref 3.80–5.24)
RDW: 14.4 % (ref 12.4–15.2)
WBC: 1.7 x10ˆ3/uL — ABNORMAL LOW (ref 3.5–10.3)

## 2018-09-17 LAB — PHOSPHORUS: PHOSPHORUS: 3.5 mg/dL (ref 2.5–4.5)

## 2018-09-17 MED ORDER — MAGNESIUM SULFATE 500 MG/ML (50 %) INJECTION SOLUTION
2.0000 g | Freq: Once | INTRAVENOUS | Status: AC
Start: 2018-09-17 — End: 2018-09-17
  Administered 2018-09-17: 2 g via INTRAVENOUS
  Administered 2018-09-17: 12:00:00 0 g via INTRAVENOUS
  Filled 2018-09-17: qty 4

## 2018-09-17 MED ADMIN — sodium chloride 0.9 % intravenous solution: TOPICAL | @ 09:00:00 | NDC 00338004904

## 2018-09-17 MED ADMIN — sodium chloride 0.9 % (flush) injection syringe: ORAL | @ 21:00:00

## 2018-09-17 MED ADMIN — SODIUM CHLORIDE 0.9 % W/ ADDITIVES: ORAL | @ 11:00:00

## 2018-09-17 MED ADMIN — calcium chloride 100 mg/mL (10 %) intravenous syringe: INTRAVENOUS | @ 11:00:00

## 2018-09-17 MED ADMIN — sodium chloride 0.9 % intravenous solution: ORAL | @ 21:00:00 | NDC 00338004904

## 2018-09-17 MED ADMIN — albumin, human 5 % intravenous solution: RECTAL | @ 17:00:00

## 2018-09-17 MED ADMIN — HYDROmorphone (PF) 1 mg/mL injection solution: ORAL | @ 14:00:00 | NDC 00409255201

## 2018-09-17 NOTE — OT Treatment (Signed)
[place]  Occupational Therapy Treatment Note    Patient Name: [name]  Date of Birth: [birth year]  Height:  152.4 cm (5')  Weight:  64.5 kg (142 lb 3.2 oz)  Room/Bed: 5121/A  Payor: MEDICARE / Plan: MEDICARE PART A AND B / Product Type: Medicare /     Date/Time of Admission: 09/04/2018  2:15 PM  Admitting Diagnosis:  Anasarca [R60.1]    The patient was seen this session for 24 total billable minutes.  The following procedures were performed this session Therapeutic Exercise, ea 15 min (97110) and Therapeutic Activity, ea 15 min (66599).      Subjective:     Patient with c/o no pain.      Objective:   Patient sitting in restroom upon arrival and agreeable to OT. Patient independent with hygiene and CGA to stand at sink and completed hand hygiene. Reports mild SOB, noted to be on 1 L02, vitals checked and 02 100%. Took short rest break in seated position at EOB and then ambulated to recliner with FWW and CGA. Patient completed green digiflex and red flexbar for pronation < > supination 3 x 10 reps as well as 2# therabar exercises for chest press, bicep curls, shoulder press, shoulder flexion and forward circumduction 2 x 10 reps with rest breaks PRN. Left in recliner with call bell and phone within reach, 02 donned at 1L.     Assessment:   Patient functional endurance during ambulation seemed impaired this date however able to complete UB exercises with minimal rest breaks.    Plan:   Continue to follow according to established plan of care.    The risks/benefits of therapy have been discussed with the patient/caregiver and he/she is in agreement with the established plan of care.     Therapist:   Hubbard Robinson, OT,  09/17/2018  Treatment Time: 24 minutes

## 2018-09-17 NOTE — PT Treatment (Signed)
[place]  Physical Therapy Treatment Note    Patient Name: [name]  Date of Birth: 1946/11/13  Height:  152.4 cm (5')  Weight:  64.5 kg (142 lb 3.2 oz)  Room/Bed: 5121/A  Payor: MEDICARE / Plan: MEDICARE PART A AND B / Product Type: Medicare /     Date/Time of Admission: 09/04/2018  2:15 PM  Admitting Diagnosis:  Anasarca [R60.1]    The patient was seen this session for 15 total billable minutes.  The following procedures were performed this session  Therapeutic Exercise, ea 15 min (67544).      Subjective:     Patient with c/o no pain.      Objective:   Pt sitting in bedside chair at [place]. Pt performed seated BLE therex * 20 reps each. Nursing students present to administer IVs. Pt resting in chair at EOS. Call bell in reach at PTA exit    Assessment:       Plan:   Continue to follow according to established plan of care.    The risks/benefits of therapy have been discussed with the patient/caregiver and he/she is in agreement with the established plan of care.     Therapist:   Arlyce Dice, PTA,  09/17/2018  Treatment Time In: 9201E  Treatment Time Out: 1100a  Time may include review of medical chart, obtaining patient's functional history from patient/family/medical staff/case management/ancillary personnel, collaboration on findings and treatment options (with the above mentioned individuals), re-assessment, and acute care rehabilitation.

## 2018-09-17 NOTE — CAA (Care Area Assessment) (Signed)
Care Area Assessment  Date: September 17, 2018 Time: 15:13    Patient Name: [name]  Medical Record Number: [id number]  Date of Birth: October 26, 1946  Sex: female  Room/Bed: 75/A  Payor Info: Payor: MEDICARE / Plan: MEDICARE PART A AND B / Product Type: Medicare /     Review of Indicators of ADLs - Functional Status/Rehabilitation Potential:    Possible underlying problems that may affect function. Some may be reversible. Supporting Documentation   Delirium (C1300) (clinical record and Delirium CAA)     CAM Score:       Acute episode or flare-up of chronic condition (I8000, clinical record)   Non-ST elevation (NSTEMI) myocardial infarction       Changing cognitive status (C0100) (see Cognitive Loss CAA)      Mood decline (D0100) (clinical record and Mood State CAA)        Daily behavioral symptoms/decline in behavior (E0200) (see Behavioral Symptoms CAA)     Physical behavior directed towards others: [0] Behavior not exhibited      Verbal behavior towards others: [0] Behavior not exhibited      Other behavior symptoms: [0] Behavior not exhibited     Use of physical restraints (P0100) (See Physical Restraints CAA)          Pneumonia (I2000)     Pneumonia: [0] No       Fall (J1700) (from record and Falls CAA)     Numver of falls since admission: Skipped     Hip fracture (I3900)       Hip Fracture: [0] No       Recent hospitalization (clinical record) (A1700, A1800 = 3 or 4)        Fluctuating ADLs (G0110A-J, G0120, G0300A-E, G0900) (observation, clinical record)    Nutritional problems (X4503U8, E2800L4) (clinical record and Nutrition CAA)        Pain (J0700) (See Pain CAA)        Dizziness    Communication problems (B0200, B0700. B0800) (clinical record and Communication CAA)   [0] Adequate      [0] Understood      [0] Understands       Vision problems (B1000) (observation, interview, clinical record, and Vision CAA)      Vision: [0] Adequate       Abnormal laboratory values (from clinical record) Supporting  Documentation   Electrolytes      Complete blood count    Blood sugar      Thyroid function    Arterial blood gases            Other         Medications that can contribute to functional decline. Supporting Documentation   Psychoactive medications (N0410A-D)     Antipsychotic: 0      Antianxiety: 0      Antidepressant: 7      Hypnotic: 0     Other medications - ask consultant pharmacist to review medication regimen to identify these medications.      Limiting factors resulting in need for assistance with any of the ADLs (observation, interview, clinical record) Supporting Documentation   Mental errors such as sequencing problems, incomplete performance, or anxiety limitations.    Physical limitations such as weakness (G0110A-J.1 = 2, 3, or 4) (G0110A-J.2 = 2 or 3), limited range of motion (G0400A = 1 or 2) (G0400B = 1 or 2), poor coordination, poor balance (G0300A-E = 2), visual impairment (B1000 = 1-4), or pain (J0300 = 1, J0700 =  1)    Facility conditions such as policies, rules, or physical layout.      Problems resident is at risk for because of functional decline (from observation, assessment, clinical record) Supporting Documentation   Falls (J1700)     Numver of falls since admission: Skipped     Weight loss (K0300)      Weight loss: [0] No or unknown       Unidentified pain (J0700)        Social isolation    Restrain use (P0100)    Depression (D0100)     PHQ-9 Severity Score: 5      Skipped      Depression: [2] Yes       Complications of immobility, such as:   Pressure ulcers (H0623)   Muscular atrophy   Contractures (G0400 A, B = 1 or 2)   Incontinence (H0300, H0400)   Urinary (I2300) and respiratory infections      Urinary Continence: [2] Frequently incontinent      Bowel Continence: [2] Frequently incontinent      UTI: [1] Yes            Input from resident and/or family/representative regarding the care area.  (Questions/Comments/Concerns/Preferences/Suggestions)        Analysis of Findings  Care  Plan Considerations       Review indicators and supporting documentation, and draw conclusions  Document:   Description of the problem;   Causes and contributing factors, and   Risk factors related to the care area.   Care Plan       Yes Document reason(s) the care plan will/will not be developed.         RESIDENT HAS HAD A DECREASE IN STRENGTH, MOBILITY, AND INDEPENDENCE WITH ADLS AND IS RECEIVING THERAPY SERVICES AS WELL AS ASSISTANCE FROM STAFF.     Referral(s) to another discipline(s) is warranted (to whom and why):         Information regarding the CAA transferred to the CAA Summary (Section V of the MDS)? ADL DOC/PT NOTES 9/25-10/1    Where rehabilitation goals are envisioned, use of the ADL Supplement will help care planners to focus on those areas that might be improved, allowing them to choose from among a number of basic tasks in designated areas. Part 1 of the supplement can assist in the evaluation of all residents that trigger this care area. Part 2 of the supplement can be helpful for residents with rehabilitation potential (ADL Triggers A), to help plan a treatment program.     ADL SUPPLEMENT   (Attaining maximum possible Independence)   PART 1: ADL Problem Evaluation        INSTRUCTIONS: For those triggered - In areas physical help provided, indicate reason(s) for this help.          Dressing         Bathing         Toileting         Locomotion         Transfer         Eating   Mental Errors:   Sequencing problems, incomplete performance, anxiety limitations, etc.   Physical Limitations:   Weakness, limited range of motion, poor coordination, visual impairment, pain, etc.   Facility Conditions:   Policies, rules, physical layout, etc                   PART 2: Possible ADL Goals  Evaluation   INSTRUCTIONS: For those triggered - In areas physical help provided, indicate reason(s) for this help.     If wheelchair Yes/No: No Dressing Bathing Toileting Locomotion Transfer Eating   For those  considered for rehabilitation or decline   prevention treatment -   Indicate specific type of ADL activity that might require:   1. Maintenance to prevent decline.   2. Treatment to achieve highest practical self-sufficiency (selecting ADL abilities that are just above those the resident can now perform or participate in).  Locates/ selects/ obtains clothes  Goes to tub/ shower  Goes to toilet (include commode/ urinal at night)  Walks in   room/   nearby   Yes Positions self in preparation  Opens/   pours/   unwraps/   cuts etc.     Grasps/puts on upper   lower body  Turns on water/ adjusts temperature  Removes/ opens clothes in preparation  Walks on unit   No Approaches chair/bed  Grasps   utensils and cups     Manages snaps, zippers, etc.  Lathers body (except back)  Transfers/ positions self  Walks throughout building (uses Media planner)   No Prepares chair/bed (locks pad, moves covers)  Scoops/   spears food (uses fingers when necessary)     Puts on in correct order  Rinses body  Eliminates into toilet  Walks outdoors   No Transfers (stands/sits/   lifts/turns)  Chews,   drinks, swallows     Grasps, removes each item  Dries with towel  Tears/uses paper to clean self  Walks on   uneven   surfaces   No Repositions/   arranges self  Repeats until food consumed     Replaces clothes   properly  Other Flushes Other  Yes Other Uses napkins, cleans self    Other  Adjusts   clothes, washes hands    Other         , Review of Indicators of Urinary Incontinence and Indwelling Catheter:    Modifiable factors contributing to transitory urinary incontinence Supporting Documentation   Delirium (C1300) (See Delirium CAA)        Urinary Tract Infection (I2300)     UTI: [1] Yes     Atrophic vaginitis in postmenopausal women (I8000)    Medications (see below)    Psychological or psychiatric problems (I5700-I6100)     Anxiety Disorder: [0] No      Depression: [1] Yes      Manic Depression (Bipolar Disorder): [0] No      Psychotic  Disorder: [0] No      Schizophrenia: [0] No      PTSD: [0] No       Constipation/impaction (H0600, clinical record)     [0] No     Caffeine use    Excessive fluid intake    Pain (J0300)     Pain in last 5 days: [1] Yes       Environmental factors     Restricted mobility (G0110.1.A-F = 2, 3, or 4) (G0110.2.A-F = 2 or 3) (See ADL CAA)     Lack of access to a toilet     Other environmental barriers (such as pads or briefs)     Restraints (P0100)            Other factors that contribute to incontinence or catheter use Supporting Documentation   Excessive or inadequate urine output    Urinary urgency AND need for assistance in toileting (Q2229.1.I =  2, 3, or 4)    Bladder cancer (I0100) or stones (I8000)     [0] No       Spinal cord or brain lesions (I8000)    Tabes dorsalis (I8000)    Neurogenic bladder (K2706)      Neurogenic Bladder: [0] No           Laboratory tests Supporting Documentation   High serum calcium    High blood glucose    Low B12    High BUN or creatinine      Diseases and conditions Supporting Documentation   Benign prostatic hypertrophy (I1400)     [0] No     Congestive Heart Failure (CHF), pulmonary edema (I0600)     Heart Failure: [1] Yes       Cerebrovascular Accident (CVA) (C3762)     CVA or TIA: [0] No       Transient Ischemic Attach (TIA) (I4500)     CVA or TIA: [0] No     Diabetes (I2900)     [1] Yes       Depression (G3151)    Depression: [1] Yes       Parkinson's disease (I5300)     Parkinson's Disease: [0] No       Prostate cancer (I0100)     [0] No         Type of incontinence Supporting Documentation   Stress (occurs with coughing, sneezing, laughing, lifting heavy objects, etc.)    Urge (overactive or spastic bladder)    Mixed (stress incontinence with urgency)    Overflow (due to blocked urethra or weak bladder muscles)    Transient (temporary/occasional related to a potentially improvable/reversable cause)    Functional (can't get to toilet in time due to physical disability,  external obstacles, or problems thinking or communicating)      Medications (from medication administration record and preadmission records if new admission; review by consultant pharmacist) Supporting Documentation   Diuretics (N0410G) - can cause urge incontinence     Diuretic: 7     Sedative hypnotics (N01410B, N0410D)     Antianxiety: 0      Antianxiety: 0     Anticholinergics - can lead to overflow incontinence:   Parkinson's medications (excepts Sinemet and Deprenyl)   Disopyramide   Antispasmodics   Antihistamines   Antipsychotics (N0410A)   Antidepressants (N0410C)   Narcotics       Antipsychotic: 0      Antidepressant: 7     Drugs that stimulate or block sympathetic nervous system    Calcium channel blockers      Use of indwelling catheter (H0100 is checked): (Presence of situation in which catheter use may be appropriate intervention after consideration of risks/benefits and after efforts to avoid catheter use have been unsuccessful) Supporting Documentation   Coma (B0100)     Comotose: [0] No       Terminal illness (O0100K)       Stage 3 or 4 pressure ulcer in area affected by incontinence    Need for exact measurement of urine output    History of inability to void after catheter removal        Input from resident and/or family/representative regarding the care area.  (Questions/Comments/Concerns/Preferences/Suggestions)        Analysis of Findings  Care Plan Considerations       Review indicators and supporting documentation, and draw conclusions  Document:   Description of the problem;   Causes and contributing factors,  and   Risk factors related to the care area.   Care Plan       Yes   Document reason(s) the care plan will/will not be developed.         RESIDENT HAS HAD A DECREASE IN STRENGTH, MOBILITY, AND INDEPENDENCE WITH ADLS AND IS RECEIVING THERAPY SERVICES AS WELL AS ASSISTANCE FROM STAFF. RESIDENT IS AT RISK FOR INCONTINENCE.     Referral(s) to another discipline(s) is warranted (to  whom and why):         Information regarding the CAA transferred to the CAA Summary (Section V of the MDS)?BB [facility] 9/25-10/1  , Review of Indicators of Psychosocial Well-Being:    Modifiable factors for relationship problems (from resident, family, staff interviews and clinical record) Supporting Documentation   Resident says or indicates he or she fells lonely.   Recent decline in social involvement and associated loneliness can be a sign of acute health complications and depression.    Resident indicates he or she feels distressed because of decline in social activities.    Over the past few years, resident has experiences absence of daily exchanges with relatives and friends.    Resident is uneasy dealing with others.    Resident has conflicts with family, friends, roommate, other residents, or staff.    Resident appears preoccupied the the past and unwilling to respond to needs of the present.    Resident seems unable or reluctant to begin to establish a social role in the facility; may be grieving lost status or roles.    Recent change in family situation or social network, such as death of a close family member or friend.          Customary lifestyle (from resident, family, staff interviews and clinical record) (Section F) Supporting Documentation   Was lifestyle more satisfactory to the resident prior to admission to the nursing home?    Are current psychosocial/relationship problems consistent with resident's long-standing lifestyle or is this relatively new for the resident?    Has facility care plan to date been as consistent as possible with resident's prior lifestyle, preferences, and routines (F0400, F0600, F0800)?      Diseases and conditions that may impede ability to interact with others Supporting Documentation   Delirium (C1300, C1600 = 1, Delirium CAA)   Mental status change: [0] No      Inattention: [0] Behavior not present      Disorganized Thinking: [0] Behavior not present      Altered Level  of consciousness: [0] Behavior not present     Intellectual disability/developmental disability (U7253)     Down Syndrome: [0] No      Autism: [0] No      Epilepsy: [0] No      Other organic condition: [0] No      ID/DD with no organic condition: [0] No     Alzheimer's disease (I4200)     Alzheimer's Disease: [0] No       Aphasia (G6440) Aphasia: [0] No       Other dementia (H4742)     Non-Alzheimer's Dementia: [0] No       Depression (V9563)     Depression: [1] Yes       Health status factors that may inhibit social involvement Supporting Documentation   Decline in activities of daily living (G0110)    Health problem, such as falls (J1700, J1800), pain (J0300, J0800), fatigue, etc.     Pain in last 5 days: [  1] Yes      Non-verbal sounds: Skipped      Vocal Complaints: Skipped      Facial expressions: Skipped      Protective body movements: Skipped     Mood (D0200A.1, D0300, D0500A.1, D0600) or behavior (E0200) problem that impacts interpersonal relationships or that arises because of social isolation (See Mood State and Behavioral Symptoms CAAs)     PHQ-9 Severity Score: 5     Change in communication (B0700, B0800), vision (B1000), hearing (B0200), cognition (C0100, C0600)   [0] Understood      [0] Understands      Vision: [0] Adequate      [0] Adequate         Medications with side effects that interfere with social interactions, such as incontinence, diarrhea, delirium, or sleepiness.      Environmental factors that may inhibit social involvement Supporting Documentation   Use of physical restraints (P0100)    Change in residence leading to loss of autonomy and reduced self-esteem. (A1700)    Change in room assignment or dining location or table mates.    Living situation limits informal social interaction, such as isolation precautions. (O0100M)      Strengths to build upon (from resident, family, staff interviews and clinical record) Supporting Documentation   Activities in which resident appears especially at  ease interacting with others.    Certain situations appeal to resident more than others, such as small groups or 1:1 interactions rather than small groups.    Certain individuals who seem to bring out a more positive, optimistic side of resident.    Positive traits that distinguished the resident as an individual prior to his or her illness.    What gave the resident a sense of satisfaction earlier in his or her life?        Input from resident and/or family/representative regarding the care area.  (Questions/Comments/Concerns/Preferences/Suggestions)        Analysis of Findings  Care Plan Considerations       Review indicators and supporting documentation, and draw conclusions  Document:   Description of the problem;   Causes and contributing factors, and   Risk factors related to the care area.   Care Plan      Yes   Document reason(s) the care plan will/will not be developed.         RESIDENT HAS HAD A DECREASE IN STRENGTH, MOBILITY, AND INDEPENDENCE WITH ADLS AND IS RECEIVING THERAPY SERVICES AS WELL AS ASSISTANCE FROM STAFF. RESIDENT IS AT RISK FOR IMPAIRED PSYCHOSOCIAL WELL BEING      Referral(s) to another discipline(s) is warranted (to whom and why):         Information regarding the CAA transferred to the CAA Summary (Section V of the MDS)? MOOD ASSESSMENT 9/25-10/1      , Review of Indicators of Fall Risk:    History of falling (J1700, J1800, J1900) Supporting Documentation   Time of day, exact hour of the fall(s)    Location of the fall(s), such as bedroom, bathroom, hallway, stairs, outside, etc.    Related to specific medication    Proximity to most recent meal    Responding to bowel or bladder urgency    Doing usual/unusual activity    Standing still or walking    Reaching up or reaching down    Identify the conclusions about the root cause(s), contributing factors related to previous falls.      Physical performance limitations: balance, gait,  strength, muscle endurance (G0300A-G0300E) Supporting  Documentation   Difficulty maintaining sitting balance    Need to rock body or push off on arms of chair when standing up from chair.    Difficulty maintaining standing position.    Impaired balance during transitions (G0300A-G0300E)    Gait problem, such as unsteady gait, even with mobility aid or personal assistance, slow gait, takes small steps, takes rapid steps, or lurching gait.    One leg appears shorter than the other.    Musculoskeletal problem, such as kyphosis, weak hip flexors from extended bed rest, or shortening of a leg.      Medications Supporting Documentation   Antipsychotics (Z6606T)     Antipsychotic: 0     Antianxiety agents (N0410B)     Antianxiety: 0     Antidepressants (K1601U)     Antidepressant: 7     Hypnotics (N0410D)     Hypnotic: 0     Cardiovascular medications (from medication administration record)    Diuretics (N0410G) (from medication administration record)  Diuretic: 7     Narcotic analgesics (from medication administration record)    Neuroleptics (from medication administration record)    Other medications that cause lethargy or confusion (from medication administration record)      Internal risk factors (from diagnosis list and clinical indicators) Supporting Documentation   Circulatory/Heart   Anemia (I0200)   Cardiac Dysrhythmias (I0300)   Angina, Myocardial Infarction (MI), Atherosclerotic Heart Disease (ASHD) (I0400)   Congestive Heart Failure (CHF) pulmonary edema (X3235)   Cerebrovascular Accident (CVA) (I4500)   Transient Ischemic Attack (TIA) (I4500)   Postural/Orthostatic hypotension (T7322)     Anemia: [0] No      Dysrythmias: [0] No      Coronary Artery Disease: [0] No      Heart Failure: [1] Yes      CVA or TIA: [0] No         Neuromuscular/functional   Cerebral palsy (I4400)   Loss of arm or leg movement (G0400)   Decline in functional status (G0110)   Incontinence (H0300, H0400)   Hemiplegia/Hemiparesis (I4900)   Parkinson's disease (I5300)   Seizure  disorder (I5400)   Paraplegia (I5000)   Multiple sclerosis (I5200)   Traumatic brain injury (I5500)   Syncope   Chronic or acute condition resulting in instability   Peripheral neuropathy   Muscle weakness      Cerebral Palsy: [0] No       Urinary Continence: [2] Frequently incontinent       Bowel Continence: [2] Frequently incontinent       Hemiplegia or Hemiparesis: [0] No       Parkinson's Disease: [0] No       Seizure disorder or epilepsy: [0] No       Paraplegia: [0] No       Multiple Sclerosis: [0] No       Orthopedic   Joint pain   Arthritis (I3700)   Osteoporosis (I3800)   Hip fracture (I3900)   Missing limb(s) (G0600D)     Arthritis: [0] No      Osteoporosis: [0] No      Hip Fracture: [0] No      Limb Prosthesis: [0] No     Perceptual   Visual impairment (B1000)   Hearing impairment (B0200)   Dizziness/vertigo    Vision: [0] Adequate     [0] Adequate       Psychiatric or cognitive   Impulsivity or poor safety awareness  Delirium (C1300)   Wandering (E0900)   Agitation behavior (E0200) - describe the specific verbal or motor activity - e.g. screaming, babbling, cursing, repetitive questions, pacing, kicking, scratching, etc.   Cognitive impairment (C0500, C0700-C1000)   Alzheimer's disease (I4200)   Other dementia (D1761)   Anxiety disorder (I5700)   Depression (I5800)   Manic depression (I5900)   Schizophrenia (I6000)          Wandering: [0] Behavior not exhibited      Physical behavior directed towards others: [0] Behavior not exhibited      Verbal behavior towards others: [0] Behavior not exhibited      Other behavior symptoms: [0] Behavior not exhibited        BIMS Score: 15      Short Term Memory: Skipped      Long Term Memory: Skipped      Daily Decision Making: Skipped        Alzheimer's Disease: [0] No      Non-Alzheimer's Dementia: [0] No      Anxiety Disorder: [0] No      Depression: [1] Yes      Manic Depression (Bipolar Disorder): [0] No      Schizophrenia: [0] No          Infection (I1700-I2500)     MDRO: [0] No      Pneumonia: [0] No      Septicemia: [0] No      UTI: [1] Yes      Viral Hepatitis: [0] No      Wound Infection: [0] No         Low levels of physical activity    Pain (J0300)    Pain in last 5 days: [1] Yes     Headache    Fatigue, weakness    Vitamin D deficiency      Laboratory tests Supporting Documentation   Hypo- or hyperglycemia            Electrolyte imbalance      Dehydration (J1550C)       Dehydration: [0] No     Hemoglobin and hematocrit         Environmental factors (from review of facility environment) Supporting Documentation   Poor lighting    Glare    Patterned carpet    Poorly arranged furniture    Uneven surfaces    Slippery floors    Obstructed walkway    Poor fitting or slippery shoes    Proximity to aggressive resident      Input from resident and/or family/representative regarding the care area.  (Questions/Comments/Concerns/Preferences/Suggestions)        Analysis of Findings  Care Plan Considerations       Review indicators and supporting documentation, and draw conclusions  Document:   Description of the problem;   Causes and contributing factors, and   Risk factors related to the care area.   Care Plan     Yes   Document reason(s) the care plan will/will not be developed.         RESIDENT HAS HAD A DECREASE IN STRENGTH, MOBILITY, AND INDEPENDENCE WITH ADLS AND IS RECEIVING THERAPY SERVICES AS WELL AS ASSISTANCE FROM STAFF. RESIDENT IS AT RISK FOR FALLS      Referral(s) to another discipline(s) is warranted (to whom and why):         Information regarding the CAA transferred to the CAA Summary (Section V of the MDS)? ADMISSION ASSESSMENT 9/24    , Review  of Indicators of Dehydration/Fluid Maintenance:    Symptoms of dehydration Supporting Documentation   Dizziness on sitting or standing    Confusion or change in mental status (delirium) (C1600, V0100D)      Delirium per CAM: No data recorded     Lethargy      Recent decrease in urine volume or  more concentrated urine that usual.    Decreased skin turgor, dry mucous membranes (J1550)        Dehydration: [0] No     Newly present constipation (H0600), fecal impaction     [0] No       Fever (J1550A)     Fever: [0] No       Functional decline (G0110)       Increased risk for falls (J1700)                   Fluid and electrolyte disturbance      Abnormal laboratory values (from clinical record) Supporting Documentation   Hemoglobin    Hematocrit        Potassium chloride    Sodium    Albumin      Blood urea nitrogen      Urine specific gravity      Cognitive, communicative, and mental status issues that can interfere with intake Supporting Documentation   Depression (U1324, D0300, M0102) or anxiety (I5700)      Depression: [1] Yes      PHQ-9 Severity Score: 5      Skipped       Behavioral disturbance that interferes with intake (E0200, clinical record)       Physical behavior directed towards others: [0] Behavior not exhibited      Verbal behavior towards others: [0] Behavior not exhibited      Other behavior symptoms: [0] Behavior not exhibited         Recent change in mental status (C1600)       Mental status change: [0] No     Alzheimer's or other dementia that interferes with eating due to short attention span, resisting assistance, slow eating/drinking, etc. (I4200, I4800)     Alzheimer's Disease: [0] No      Non-Alzheimer's Dementia: [0] No       Difficulty making self understood (B0700)     [0] Understood       Difficulty understanding others (B0800)     [0] Understands         Diseases and conditions that predispose to limitations in maintaining normal fluid balance Supporting Documentation   Infection (I1700-I2500)     MDRO: [0] No      Pneumonia: [0] No      UTI: [1] Yes      Viral Hepatitis: [0] No      Wound Infection: [0] No       Fever (J1550A)     Fever: [0] No     Diabetes (I2900)     [1] Yes       Congestive heart failure (V2536)       Heart Failure: [1] Yes       Swallow problem (K0100)       Renal disease (I1500)     [0] No         Weight loss (K0300)     Weight loss: [0] No or unknown     Weight gain (K0310)     Weight Gain: [0] No or unknown     New cerebrovascular accident (clinical record,  I8000)       Unstable acute or chronic condition (clinical record, I8000)    Nausea or vomiting (J1550B)      Nausea/Vomiting: [0] No     Diarrhea (clinical record)    Excessive sweating (clinical record)    Recent surgery (clinical record, I8000)    Recent decline in activities of daily living (G0110), including body control or hand control problems, inability to sit up (G0300), etc. (observation, interview, clinical record)    Parkinson's or other neurological disease that requires unusually long time to eat (I4200-I5500)     Alzheimer's Disease: [0] No          Aphasia: [0] No         Cerebral Palsy: [0] No           CVA or TIA: [0] No          Non-Alzheimer's Dementia: [0] No          Hemiplegia or Hemiparesis: [0] No          Paraplegia: [0] No      Quadriplegia: [0] No      Multiple Sclerosis: [0] No      Merrill's Disease: [0] No      Parkinson's Disease: [0] No      Seizure disorder or epilepsy: [0] No      Traumatic Brain Injury: [0] No       Abdominal pain, with or without diarrhea, nausea, or vomiting (clinical record, J1550B)     Nausea/Vomiting: [0] No     Newly taking a diuretic or recent increase in diuretic dose (N0410G) (medication records)     Diuretic: 7     Takes excessive doses of a laxative (interview, clinical record)    Hot weather (increase risk for elderly in absence of increased fluid intake)      Oral intake (from observation and clinical record) Supporting Documentation   Recent change in oral intake    Skips meals or consumes less than 25 percent of meals    Fluid restriction    Newly prescribed diet    Decreased perception of thirst    Limited fluid-drinking opportunities    Fluid intake limited to try to control incontinence    Dependence on staff for fluid intake    Excessive  output compared to fluid intake        Input from resident and/or family/representative regarding the care area.  (Questions/Comments/Concerns/Preferences/Suggestions)              Analysis of Findings  Care Plan Considerations       Review indicators and supporting documentation, and draw conclusions  Document:   Description of the problem;   Causes and contributing factors, and   Risk factors related to the care area.   Care Plan      Yes Document reason(s) the care plan will/will not be developed.         RESIDENT HAS A UTI AND IS AT RISK FOR DEHYDRATION      Referral(s) to another discipline(s) is warranted (to whom and why):         Information regarding the CAA transferred to the CAA Summary (Section V of the MDS)? PMH, MED DX, I/O, DIET ORDER 9/25-10/1    , Review of Indicators of Pressure Ulcers:    Existing pressure ulcer(s) (M0100) Supporting Documentation   Assess location, size, stage, presence, and type of drainage, presence of odors, condition of surrounding skin (U1324)   Note  if eschar or slough is present (M0300F)          Unstageable pressure ulcers: Skipped             If the ulcer does not show signs of healing despite treatment, consider complicating factors:   Elevated bacterial level in the absence of clinical infection   Presence of exudate, necrotic debris or slough in the wound, too much granulation tissue, or odor in the wound bed   Underlying osteomyelitis (bone infection)      Extrinsic risk factors Supporting Documentation   Pressure   Requires staff assistance to move sufficiently to relieve pressure over any one site   Confined to a bed or chair all or most of the time   Needs special mattress or seat cushion to reduce or relieve pressure (M1200A, M1200B)   Requires regular schedule of turning (M1200C)             Turning and repositioning: [1] Yes       Pressure reducing device for chair: [1] Yes       Pressure reducing device for bed: [1] Yes        Friction and  shear   Slides down in the bed   Moved by sliding rather than lifting    Maceration   Persistently wet, especially from fecal incontinence, wound drainage, or perspiration   Moisture associated skin damage (M1040H)       MASD: [0] No       Intrinsic risk factors Supporting Documentation   Immobility (J0093)     Altered mental status   Delirium limits mobility (see Delirium CAA)   Cognitive loss (C0500, C0700-C1000) limits mobility (see Cognitive Loss CAA)   BIMS Score: 15      BIMS Score: 15      Short Term Memory: Skipped      Long Term Memory: Skipped           Incontinence (H0300, H0400, M1040H) (see Incontinence CAA)      Urinary Continence: [2] Frequently incontinent       Bowel Continence: [2] Frequently incontinent           Poor nutrition (see Nutrition CAA)      Medications that increase risk for pressure ulcer development Supporting Documentation   Antipsychotics (G1829H)     Antipsychotic: 0     Antianxiety agents (N0410B)     Antianxiety: 0     Antidepressants (N0410C)    Antidepressant: 7     Hypnotics (N0410D)       Hypnotic: 0     Steroids    Narcotics      Diagnoses and conditions that present complications or increase risk for pressure ulcers Supporting Documentation   Delirium (C1600)     Per CAM No data recorded       Comatose (B0100)     Comotose: [0] No       Cancer (I0100)  [0] No       Peripheral Vascular Disease (I0900)     [0] No       Diabetes (I2900)     [1] Yes       Alzheimer's disease (I4200)     Alzheimer's Disease: [0] No       Cerebrovascular Accident (B7169)     CVA or TIA: [0] No       Other dementia (I4800)     Non-Alzheimer's Dementia: [0] No       Hemiplegia/hemiparesis (I4900)  Hemiplegia or Hemiparesis: [0] No       Paraplegia (I5000), Quadriplegia (I5100)     Paraplegia: [0] No      Quadriplegia: [0] No       Multiple sclerosis (I5200)     Multiple Sclerosis: [0] No       Depression (D0300, D0600, I5800)     PHQ-9 Severity Score: 5      Skipped      Depression: [1]  Yes       Edema    Severe pulmonary disease (G1829)     Asthma, COPD or chronic lung disease: [0] No       Sepsis (I2100)     Septicemia: [0] No       Terminal illness (O0100K)    Chronic or end-stage renal (I1500), liver, or heart disease (I0400, I0600)     [0] No       Pain (J0300)    Pain in last 5 days: [1] Yes     Dehydration (J1500C, I8000)     Dehydration: [0] No     Shortness of breath (J1100)   .    Short of Breath with exertion: [1] Yes      short of breath at rest: [0] No      Short of breath when lying flat: [0] No       Recent weight loss (K0300)    Weight loss: [0] No or unknown     Recent weight gain (H3716)    Weight Gain: [0] No or unknown     Malnutrition (I5600)     Poor Nutrition: [0] No       Decreased sensory perception    Recent decline in Activities of Daily Living (ADLs) (R6789-F8101)      Treatments and other factors that cause complications or increase risk Supporting Documentation   Newly admitted or readmitted (A1700)    Chemotherapy (O0100A)      Radiation therapy (O0100B)     Radiation: [0] No       Ventilator or respirator (O0100F)     Ventilatior, respirator: [0] No       Renal dialysis (O0100J)     Dialysis: [0] No     Functional limitation in range of motion (G0400)     Upper Extremities: [1] Impairment on one side      Lower Extremities: [0] No impairment     Head of bed elevated most of the time    Physical restraints (P0100)    Devices that can cause pressure, such as oxygen (O0100C) or indwelling catheter (H0100A) tubing, TED hose, casts, or splints.     [0] No         Input from resident and/or family/representative regarding the care area.  (Questions/Comments/Concerns/Preferences/Suggestions)        Analysis of Findings  Care Plan Considerations       Review indicators and supporting documentation, and draw conclusions  Document:   Description of the problem;   Causes and contributing factors, and   Risk factors related to the care area.   Care Plan       Yes Document  reason(s) the care plan will/will not be developed.         RESIDENT HAS HAD A DECREASE IN STRENGTH, MOBILITY, AND INDEPENDENCE WITH ADLS AND IS RECEIVING THERAPY SERVICES AS WELL AS ASSISTANCE FROM STAFF. RESIDENT IS AT RISK FOR PRESSURE ULCERS      Referral(s) to another discipline(s) is warranted (to whom and why):  Information regarding the CAA transferred to the CAA Summary (Section V of the MDS)?RISK ASSESSMENT 9/24 Review of Indicators of Psychotropic Drug Use:    Class(es) of medication this resident is taking Supporting Documentation   Antipsychotic (R1540G)     Antipsychotic: 0     Antianxiety (N0410B)     Antianxiety: 0     Antidepressant (N0410C)     Antidepressant: 7     Sedative/Hypnotic (N0410D)     Hypnotic: 0       Unnecessary drug evaluation (from clinical record) Supporting Documentation   Excessive dose, including duplicate medications    Excessive duration and/or without gradual dose reductions    Inadequate monitoring for effectiveness and/or adverse consequences    Inadequate or inappropriate indications for use    In presence of adverse consequences of the drug      Treatable/reversible reasons for use of psychotropic drug Supporting Documentation   Environmental stressors such as excessive heat, noise, overcrowding, etc. (observation, clinical record)    Psychosocial stressors such as abuse, taunting, not following resident's customary routine, etc. (observation, clinical record) (F0300-F0800)    Treatable medical conditions, such as heart disease (I0200-I0900), diabetes (I2900), or respiratory disease (from medical evaluation) (I6200, I6300)     Anemia: [0] No    [0] No      Dysrythmias: [0] No    Heart Failure: [1] Yes      Coronary Artery Disease: [0] No    [0] No      [1] Yes    Asthma, COPD or chronic lung disease: [0] No      Respiratory Failure: [0] No                 Adverse consequences of ANTIDEPRESSANTS exhibited by this resident Supporting Documentation   Worsening of  depression and/or suicidal behavior or thinking (D0350, D0650, V0100E, V0100F, clinical record)        Delirium unrelated to medical illness or severe depression (C1600, clinical record)      No data recorded     Hallucinations (E0100A)     Hallucinations: [0] No     Dizziness (clinical record)    Nausea (clinical record)    Diarrhea (clinical record)    Anxiety (I5700, clinical record)     Anxiety Disorder: [0] No       Nervousness, fidgety, or restless (clinical record)    Insomnia (clinical record)    Somnolence (clinical record)    Weight gain (K0310, clinical record)     Weight Gain: [0] No or unknown     Anorexia or increased appetite (clinical record)    Increased risk for falls (clinical record), falls (J1700-J1900)     Numver of falls since admission: Skipped     Seizures (I5400)     Seizure disorder or epilepsy: [0] No     Hypertensive crisis if combined with certain foods, cheese, wine (MAO inhibitors)    Anticholinergic (tricyclics), such as constipation, dry mouth, blurred vision, urinary retention, etc. (clinical record)    Postural hypotension (tricyclics) (Q6761, clinical record)     Orthostatic Hypotension [0] No       Adverse consequences of ANTIPSYCHOTICS exhibited by this resident Supporting Documentation   Anticholinergic effects, such as constipation, dry mouth, blurred vision, urinary retention, etc. 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)    Blood sugar elevation (clinical record)    Cardiac arrhythmias (I0300)     Dysrythmias: [0] No     Orthostatic hypotension (I0800, clinical record)     Orthostatic Hypotension [0] No     Cerebrovascular accident or  transient ischemic attack (I9485)     CVA or TIA: [0] No     Falls (J1700-J1900)     Numver of falls since admission: Skipped     Tardive dyskinesia (persistent involuntary movements such as tongue thrusting, lip movements, chewing or puckering movements, abnormal limb movement, rocking or writhing trunk movements) (clinical record)    Lethargy (D0200D, clinical record)     Number of days tired: [1] 2-6 days     Excessive sedation (clinical record)    Depression (D0300, D0600, I5800)     PHQ-9 Severity Score: 5      Skipped      Depression: [1] Yes       Hallucinations (E0100A)     Hallucinations: [0] No     Delirium unrelated to medical illness or severe depression (C1600, clinical record)     No data recorded       Adverse consequences of ANXIOLYTICS exhibited by this resident Supporting Documentation   Sedation manifested by short-term memory loss (C0500, C0700), decline in cognitive abilities, slurred speech (B0600), drowsiness, little/no activity involvement (clinical record)       BIMS Score: 15      [0] Clear speech     Delirium unrelated to medical illness or severe depression.     No data recorded     Hallucinations (E0100A)     Hallucinations: [0] No     Depression (D0300, D0600, I5800)     PHQ-9 Severity Score: 5      Skipped      Depression: [1] Yes       Disturbances of balance, gait, positioning ability (G0300, G0110C, G0110D, G0110A, clinical record)      Adverse consequences of SEDATIVES/HYPNOTICS exhibited by this resident Supporting Documentation   May increase the metabolism of many medications (for example, anticonvulsants, antipsychotics), which may lead to decreased effectiveness and subsequent worsening of symptoms or decreased control of underlying illness (clinical record)    Hypotension (I0800, clinical record)     Orthostatic Hypotension [0] No     Dizziness, lightheadedness (clinical record)    "Hangover" effect (interview, clinical record)    Drowsiness (observation, clinical record)     Confusion, delirium unrelated to acute illness or severe depression (C1600, clinical record)     No data recorded     Mental depression (I5800, I5900)     Depression: [1] Yes      Manic Depression (Bipolar Disorder): [0] No       Unusual excitement (clinical record)    Nervousness (clinical record)    Headache (interview, clinical record)    Insomnia (clinical record)    Nightmares (interview, clinical record)    Hallucinations (E0100A)     Hallucinations: [0] No     Falls (J1700-J1900)       Numver of falls since admission: Skipped       Drug-related discomfort requiring treatment and/or prevention Supporting Documentation   Dehydration (J1550C, I8000)     Dehydration: [0] No      Non-ST elevation (NSTEMI) myocardial infarction       Reduced dietary bulk (from observation of  food intake)    Lack of exercise (observation, clinical record)    Constipation/fecal impaction (H0600, clinical record)     [0] No     Urinary retention (clinical record)    Dry mouth (interview, clinical record)            Overall status change for relationship to psychotropic drug use (from clinical record) Supporting Documentation   Major differences in AM/PM performance    Decline in cognition/communication (V0100D)    Decline in mood (V0100E, V0100F)    Decline in behavior    Decline in Activities of Daily Living (ADLs) (G0100)         Input from resident and/or family/representative regarding the care area.  (Questions/Comments/Concerns/Preferences/Suggestions)        Analysis of Findings  Care Plan Considerations       Review indicators and supporting documentation, and draw conclusions  Document:   Description of the problem;   Causes and contributing factors, and   Risk factors related to the care area.   Care Plan       Yes Document reason(s) the care plan will/will not be developed.         RESIDENT TAKES ANTIDEPRESSANTS ON A ROUTINE DAILY BASIS      Referral(s) to another discipline(s) is warranted (to whom and why):          Information regarding the CAA transferred to the CAA Summary (Section V of the MDS)? _MED ADMIN 9/25-10/1 and Review of Indicators of Pain:    Diseases and conditions that may cause pain (diagnosis OR signs/symptoms present) Supporting Documentation   Cancer (I0100)     [0] No       Circulatory/heart   Angina, Myocardial Infarction (MI), Atherosclerotic Heart Disease (ASHD) (I0400)   Deep Vein Thrombosis (I0500)   Peripheral Vascular Disease (F8101)    Coronary Artery Disease: [0] No       [0] No       [0] No       Skin/Wound   Pressure ulcer (Section M)   Other ulcers, wounds, incision, skin problems (M1040)   Moisture associated skin damage (M1040H)     MASD: [0] No     Infection   Urinary tract infection I2300)   Pneumonia (I2000)        Pneumonia: [0] No       UTI: [1] Yes       Neurological (I4200-I5500)   Head trauma (clinical record)   Headache   Neuropathy   Post-stroke syndrome   Alzheimer's Disease: [0] No         Gastrointestinal   Gastroesophageal Reflux Disease/Ulcer (I1200)   Ulcerative Colitis/Crohn's Disease/Inflammatory Bowel Disease (I1300)       [0] No      .   Hospice care (O0100K)     Hospice Care: [0] No       Musculoskeletal   Arthritis (I3700)   Osteoporosis (I3800)   Hip fracture (I3900)   Other fracture (I4000)   Back problems (I8000)   Amputation (O0500)   Other (I8000)    Arthritis: [0] No       Osteoporosis: [0] No       Hip Fracture: [0] No       Other Fracture: [0] No                     Dental problems (Section L) (L0200)     No natural teeth: [0] No  Abnormal mouth tissue: [0] No      Cavity or broken teeth: [0] No      Inflamed, bleeding gums, loose teeth: [0] No      Mouth of facial pain: [0] No       Characteristics of the pain Supporting Documentation   Location    Type (constant, intermittent, varies over time, etc.)    What makes it better    What makes it worse    Words that describe it (for example, aching, soreness, dull, throbbing,  crushing)   Burning, pins and needles, shooting, numbness (neuropathic)   Cramping, crushing, throbbing, stabbing (musculoskeletal)   Cramping, tightness (visceral)         Frequency and intensity of the pain (J0400, J0600, S8546) Supporting Documentation   How often it occurs       Frequency of pain: [2] Frequently       Time or situation of onset         How long it lasts       Non-verbal indicators of pain (particularly important if resident is stoic) Supporting Documentation   Facial expression (frowning, grimacing, etc.) (J0800A, J0800C)     Non-verbal sounds: Skipped      Facial expressions: Skipped     Vocal behaviors (signing, moaning, groaning, crying, etc.) (J0800A, J0800B)       Non-verbal sounds: Skipped      Vocal Complaints: Skipped     Body position (guarding, distorted posture, restricted limb movement, etc.) (J0800D)     Protective body movements: Skipped     Restlessness      Pain effect on function Supporting Documentation   Disturbs sleep (J0500A)     [1] Yes     Decreases appetite (clinical record)    Adversely affects mood (D0200, D0500, clinical record)     PHQ-9 Severity Score: 5      Skipped       Limits day-to-day activities (J0500B) (social events, eating in dining room, etc.)     [0] No     Limits independence with at least some Activities of Daily Living (ADLs) (G0110)      Associated signs and symptoms Supporting Documentation   Agitation or new or increased behavior problems (E0200) - describe the specific verbal or motor activity - e.g. Screaming, babbling, cursing, repetitive questions, pacing, kicking, scratching, etc.     Physical behavior directed towards others: [0] Behavior not exhibited      Verbal behavior towards others: [0] Behavior not exhibited      Other behavior symptoms: [0] Behavior not exhibited     Delirium (C1600)      Per CAM No data recorded     Withdrawal      Other Considerations Supporting Documentation   Improper positioning (M1200C)     Turning and  repositioning: [1] Yes     Contractures          Immobility (G0110)    Use of restraints (P0100)     Recent change in pain (characteristics, frequency, intensity, etc.) (J0400, J0600)     Frequency of pain: [2] Frequently          Pain intensity: Skipped           Insufficient pain relief (from resident/staff interview, clinical record, direct observation) (J0100-J0850)      Pain in last 5 days: [1] Yes      Frequency of pain: [2] Frequently          Pain relief  occurs, but duration is not sufficient, resulting in breakthrough pain (J3354-T6256)        Input from resident and/or family/representative regarding the care area.  (Questions/Comments/Concerns/Preferences/Suggestions)        Analysis of Findings  Care Plan Considerations       Review indicators and supporting documentation, and draw conclusions  Document:   Description of the problem;   Causes and contributing factors, and   Risk factors related to the care area.   Care Plan      Yes   Document reason(s) the care plan will/will not be developed.              Referral(s) to another discipline(s) is warranted (to whom and why):         Information regarding the CAA transferred to the CAA Summary (Section V of the MDS)? PAIN ASSESSMENT 9/25-10/1

## 2018-09-18 LAB — POC BLOOD GLUCOSE (RESULTS)
GLUCOSE, POC: 84 mg/dL (ref 70–110)
GLUCOSE, POC: 389 mg/dL — ABNORMAL HIGH (ref 70–110)
GLUCOSE, POC: 225 mg/dL — ABNORMAL HIGH (ref 70–110)
GLUCOSE, POC: 246 mg/dL — ABNORMAL HIGH (ref 70–110)

## 2018-09-18 MED ADMIN — HYDROcodone 5 mg-acetaminophen 325 mg tablet: ORAL | @ 23:00:00

## 2018-09-18 MED ADMIN — DULoxetine 30 mg capsule,delayed release: ORAL | @ 09:00:00

## 2018-09-18 MED ADMIN — insulin lispro 100 unit/mL subcutaneous solution: SUBCUTANEOUS | @ 22:00:00

## 2018-09-18 NOTE — OT Treatment (Signed)
[facility]  Occupational Therapy Treatment Note    Patient Name: [name]  Date of Birth: 02/20/1946  Height:  152.4 cm (5')  Weight:  64.1 kg (141 lb 4.8 oz)  Room/Bed: 5121/A  Payor: MEDICARE / Plan: MEDICARE PART A AND B / Product Type: Medicare /     Date/Time of Admission: 09/04/2018  2:15 PM  Admitting Diagnosis:  Anasarca [R60.1]    The patient was seen this session for 15 total billable minutes.  The following procedures were performed this session Therapeutic Activity, ea 15 min (72620).      Subjective:     Patient with c/o no pain. Does state however that "I feel dizzy when I stand up."       Objective:   Patient sitting in recliner upon arrival and agreeable to OT. Patient agreed to standing activity and once in standing, became pale and reports dizziness. Patient sat down with min A, vitals taken and BP was 90/40 (manually) and 60/30 standing. Nurse called to room and checked in seated, getting 80/40, contacted physician. Patient was a 2 person assist (for safety) to ambulate to bed and laid down with call bell on chest and feet elevated. Left with call bell and phone within reach and nursing monitoring patient.     Assessment:   Patient BP and dizziness affected patient ability to participate. Also reports headache and feels that her fall last week was also related to "this type of episode" and she wants someone to listen about her symptoms because she thinks something is wrong.     Plan:   Continue to follow according to established plan of care.    The risks/benefits of therapy have been discussed with the patient/caregiver and he/she is in agreement with the established plan of care.     Therapist:   Hubbard Robinson, OT,  09/18/2018  Treatment Time: 15 minutes

## 2018-09-18 NOTE — PT Treatment (Signed)
Tx with-held per charge nurse d/t low BP/dizziness.

## 2018-09-18 NOTE — PT Treatment (Signed)
[place]  Physical Therapy Treatment Note    Patient Name: [name]  Date of Birth: 29-Jan-1946  Height:  152.4 cm (5')  Weight:  64.1 kg (141 lb 4.8 oz)  Room/Bed: 5121/A  Payor: MEDICARE / Plan: MEDICARE PART A AND B / Product Type: Medicare /     Date/Time of Admission: 09/04/2018  2:15 PM  Admitting Diagnosis:  Anasarca [R60.1]    The patient was seen this session for 29 total billable minutes.  The following procedures were performed this session  Therapeutic Activity, ea 15 min (09735) and Gait Training, ea 15 min [phone]).      Subjective:     Patient with c/o 8/10 headache pain. Pt given pain medicine      Objective:   Pt supine in bed at SOS. Supine to sit with S. STSs * 4 to FWW with SBA and VC for hand placement. Gait training with FWW and SBA with w/c * 250' with one seated rest break. SPT w/c to bedside chair with SBA. Pt resting in chair and set up with lunch tray. Call bell in reach at PTA exit.   * Pt on 1L O2 for duration this date    Assessment:   Pt limited by headache this AM    Plan:   Continue to follow according to established plan of care.    The risks/benefits of therapy have been discussed with the patient/caregiver and he/she is in agreement with the established plan of care.     Therapist:   Arlyce Dice, PTA,  09/18/2018  Treatment Time In: 4268T  Treatment Time Out: 1152  Time may include review of medical chart, obtaining patient's functional history from patient/family/medical staff/case management/ancillary personnel, collaboration on findings and treatment options (with the above mentioned individuals), re-assessment, and acute care rehabilitation.

## 2018-09-18 NOTE — Progress Notes (Signed)
Hospitalist   Progress Note    [name]       72 y.o.       Date of service: 09/18/2018  Date of Admission:  09/04/2018    Hospital Day:  LOS: 14 days   CC: No chief complaint on file.      Subjective:  Patient resting in bed.  States she has some dizziness when she sits up.  Patient denies any GI or GU complaints.  Did discuss with patient that she should change positions very slowly.    Objective:   Filed Vitals:    09/18/18 0639 09/18/18 1300 09/18/18 1455 09/18/18 1700   BP: 136/64 (!) 80/40 121/67 (!) 137/54   Pulse: 60   70   Resp: 20   18   Temp: 35 C (95 F)   36.8 C (98.2 F)   SpO2: 98%   100%     I have reviewed the vitals.    Physical Exam:  Constitutional: Patient is in no acute distress.  Appears chronically ill  HEENT: Head normocephalic and atraumatic.   Lungs: Clear to auscultation bilaterally without wheezes, rales or rhonchi.  Cardiovascular: Regular rate and rhythm. No murmur, rub, or gallop.   Abdomen: Normal bowel sounds. Soft, nontender, nondistended.   MSK/Extremities: Moves all extremities. Pulses equal bilaterally.  Trace bilateral lower extremity edema  Skin: No lesions noted.  No rashes.  Neuro: Alert and oriented X 3. CNs 2-12 grossly intact. No facial droop noted.        Labs:  Lab Results Today:    Results for orders placed or performed during the hospital encounter of 09/04/18 (from the past 24 hour(s))   POC BLOOD GLUCOSE (RESULTS)   Result Value Ref Range    GLUCOSE, POC 215 (H) 70 - 110 mg/dl   POC BLOOD GLUCOSE (RESULTS)   Result Value Ref Range    GLUCOSE, POC 84 70 - 110 mg/dl   POC BLOOD GLUCOSE (RESULTS)   Result Value Ref Range    GLUCOSE, POC 389 (H) 70 - 110 mg/dl   POC BLOOD GLUCOSE (RESULTS)   Result Value Ref Range    GLUCOSE, POC 225 (H) 70 - 110 mg/dl     I have reviewed all labs.    Micro:   No results found for any visits on 09/04/18 (from the past 96 hour(s)).    Radiology:       Assessment/ Plan:   Active Hospital Problems    Diagnosis   . Anasarca          Weakness and deconditioning  -continue PT and OT    Hyponatremia  -stable, improving  -continue fluid restriction  -continue to have nephrology following    Anasarca  -improved  -multifactorial likely due to possible liver cirrhosis and diastolic CHF  -status post Whipple procedure  -will need outpatient GI referral at discharge for right upper quadrant ultrasound showing hepatomegaly and minimal changes consistent with cirrhosis    Pancreatic cancer  -status post robotic surgery and chemo radiation  -stop chemo in July 2018 due to side effects  -follow up with Oncology as outpatient    Hypertension  -continue losartan, metoprolol, hydralazine    Recent PE  -continue Eliquis    Recent NSTEMI  -troponin peaked at 0.46  -follow up with Cardiology as an outpatient    Orthostatic hypertension  -Ted hose  -instructed patient to change positions slowly  -continue to monitor    DVT/PE Prophylaxis: Apixaban  Disposition Planning: Home discharge  and [place], FNP  09/18/2018, 19:30

## 2018-09-18 NOTE — Nurses Notes (Signed)
Called to patient's room by therapy.  Reported that patient became dizzy and weak when sitting and attempting to stand.  Blood pressure manually checked.  90/40 sitting and 60/30 standing.  Rechecked sitting blood pressure 80/40.  Patient states she feels dizzy and weak.  Appears weak.  Charge Nurse notified and Darylene Price NP notified.  Medications and blood pressures reviewed with Lauren.

## 2018-09-18 NOTE — Nurses Notes (Signed)
Spoke with Illene Bolus NP states patient is medically stable for d/c whenever ready. Ladora Daniel, RN

## 2018-09-19 LAB — POC BLOOD GLUCOSE (RESULTS)
GLUCOSE, POC: 92 mg/dL (ref 70–110)
GLUCOSE, POC: 264 mg/dL — ABNORMAL HIGH (ref 70–110)
GLUCOSE, POC: 264 mg/dl — ABNORMAL HIGH (ref 70–110)
GLUCOSE, POC: 148 mg/dL — ABNORMAL HIGH (ref 70–110)
GLUCOSE, POC: 162 mg/dL — ABNORMAL HIGH (ref 70–110)

## 2018-09-19 MED ADMIN — albuterol sulfate 2.5 mg/3 mL (0.083 %) solution for nebulization: RESPIRATORY_TRACT | @ 23:00:00

## 2018-09-19 MED ADMIN — oxybutynin chloride 5 mg tablet: @ 22:00:00

## 2018-09-19 NOTE — OT Treatment (Signed)
[place]  Occupational Therapy Treatment Note    Patient Name: [name]  Date of Birth: 12/20/1945  Height:  152.4 cm (5')  Weight:  64.1 kg (141 lb 4.8 oz)  Room/Bed: 5121/A  Payor: MEDICARE / Plan: MEDICARE PART A AND B / Product Type: Medicare /     Date/Time of Admission: 09/04/2018  2:15 PM  Admitting Diagnosis:  Anasarca [R60.1]    The patient was seen this session for 23 total billable minutes.  The following procedures were performed this session Therapeutic Exercise, ea 15 min (49702).      Subjective:     Patient with c/o 2/10 headache pain.      Objective:   Patient supine in bed upon arrival, denies getting OOB for therapy at this time. Patient completed 3# therabar exercises for chest press, shoulder press, bicep curl and forward/backward circumduction 2 x 10 reps. Patient completed green theraband exercises for chest pull, chest press, shoulder abduction, tricep extension and elbow flexion 3 x 10 reps. Patient completed red flexbar exercises for pronation < > supination 3 x 10 reps. Left in supine with call bell and phone within reach.      Assessment:   Patient continues to have BP issues. Took in supine at start of session with machine results of 137/56. Patient reports continuing to feel dizzy upon getting OOB.    Plan:   Continue to follow according to established plan of care.    The risks/benefits of therapy have been discussed with the patient/caregiver and he/she is in agreement with the established plan of care.     Therapist:   Hubbard Robinson, OT,  09/19/2018  Treatment Time: 23 minutes

## 2018-09-19 NOTE — PT Treatment (Signed)
[facility]  Physical Therapy Treatment Note    Patient Name: [name]  Date of Birth: 05/17/1946  Height:  152.4 cm (5')  Weight:  64.1 kg (141 lb 4.8 oz)  Room/Bed: 5121/A  Payor: MEDICARE / Plan: MEDICARE PART A AND B / Product Type: Medicare /     Date/Time of Admission: 09/04/2018  2:15 PM  Admitting Diagnosis:  Anasarca [R60.1]    The patient was seen this session for 23 total billable minutes.  The following procedures were performed this session  Therapeutic Exercise, ea 15 min (97110) and Therapeutic Activity, ea 15 min (99357).      Subjective:     Patient with c/o headache pain and dizziness.      Objective:   Pt supine in bed at SOS. Supine to sit with S and increased time. STS to [place] with SBA. Gait training iwht FWW and SBA * 15' and 25' Toilet t/f with FWW and SBA. VC for safety and hand placemetn. Pt Ind for hygiene of urine. Seated BLE therex 15 reps * 2. Pt resting in chair at EOS. Call bell in reach at PTA exit.     Assessment:       Plan:   Continue to follow according to established plan of care.    The risks/benefits of therapy have been discussed with the patient/caregiver and he/she is in agreement with the established plan of care.     Therapist:   Arlyce Dice, PTA,  09/19/2018  Treatment Time In: 1108a  Treatment Time Out: 0177L  Time may include review of medical chart, obtaining patient's functional history from patient/family/medical staff/case management/ancillary personnel, collaboration on findings and treatment options (with the above mentioned individuals), re-assessment, and acute care rehabilitation.

## 2018-09-20 LAB — BASIC METABOLIC PANEL
ANION GAP: 3 mmol/L
BUN/CREA RATIO: 12
BUN: 9 mg/dL — ABNORMAL LOW (ref 10–25)
CALCIUM: 7.4 mg/dL — ABNORMAL LOW (ref 8.8–10.3)
CHLORIDE: 102 mmol/L (ref 98–111)
CO2 TOTAL: 27 mmol/L (ref 21–35)
CREATININE: 0.78 mg/dL (ref <=1.30)
ESTIMATED GFR: >60 mL/min/{1.73_m2}
ESTIMATED GFR: >60 mL/min/{1.73_m2}
GLUCOSE: 100 mg/dL (ref 70–110)
POTASSIUM: 4.4 mmol/L (ref 3.5–5.0)
SODIUM: 132 mmol/L — ABNORMAL LOW (ref 135–145)

## 2018-09-20 LAB — CBC
HCT: 24.6 % — ABNORMAL LOW (ref 34.6–46.2)
HGB: 8.4 g/dL — ABNORMAL LOW (ref 11.8–15.8)
HGB: 8.4 g/dL — ABNORMAL LOW (ref 11.8–15.8)
MCH: 31.4 pg (ref 27.6–33.2)
MCHC: 34 g/dL (ref 32.6–35.4)
MCV: 92.5 fL (ref 82.3–96.7)
MPV: 10.2 fL (ref 6.6–10.2)
PLATELETS: 81 10*3/uL — ABNORMAL LOW (ref 140–440)
PLATELETS: 81 x10ˆ3/uL — ABNORMAL LOW (ref 140–440)
RBC: 2.66 x10ˆ6/uL — ABNORMAL LOW (ref 3.80–5.24)
RDW: 14.6 % (ref 12.4–15.2)
WBC: 1.9 x10ˆ3/uL — ABNORMAL LOW (ref 3.5–10.3)

## 2018-09-20 LAB — PHOSPHORUS: PHOSPHORUS: 3.7 mg/dL (ref 2.5–4.5)

## 2018-09-20 LAB — POC BLOOD GLUCOSE (RESULTS)
GLUCOSE, POC: 93 mg/dL (ref 70–110)
GLUCOSE, POC: 272 mg/dL — ABNORMAL HIGH (ref 70–110)
GLUCOSE, POC: 133 mg/dL — ABNORMAL HIGH (ref 70–110)
GLUCOSE, POC: 215 mg/dL — ABNORMAL HIGH (ref 70–110)

## 2018-09-20 LAB — MAGNESIUM: MAGNESIUM: 1.5 mg/dL — ABNORMAL LOW (ref 1.8–2.3)

## 2018-09-20 MED ADMIN — insulin lispro 100 unit/mL subcutaneous solution: SUBCUTANEOUS | @ 11:00:00

## 2018-09-20 MED ADMIN — citalopram 20 mg tablet: ORAL | @ 08:00:00

## 2018-09-20 MED ADMIN — atorvastatin 10 mg tablet: ORAL | @ 23:00:00

## 2018-09-20 MED ADMIN — erythromycin 5 mg/gram (0.5 %) eye ointment: ORAL | @ 06:00:00 | NDC 17478007031

## 2018-09-20 MED ADMIN — DEXTROSE 5 1/4 NORMAL SALINE W/ ADDITIVES: TOPICAL | @ 22:00:00 | NDC 00409792409

## 2018-09-20 NOTE — PT Treatment (Signed)
[facility]  Physical Therapy Treatment Note    Patient Name: [name]  Date of Birth: [birth year]  Height:  152.4 cm (5')  Weight:  64.4 kg (142 lb)  Room/Bed: 5121/A  Payor: MEDICARE / Plan: MEDICARE PART A AND B / Product Type: Medicare /     Date/Time of Admission: 09/04/2018  2:15 PM  Admitting Diagnosis:  Anasarca [R60.1]    The patient was seen this session for 23 total billable minutes.  The following procedures were performed this session  Therapeutic Exercise, ea 15 min (97110) and Gait Training, ea 15 min [phone]).      Subjective:     Patient with c/o headache pain. Pt reports headaches did not start until hospital admission.       Objective:   Pt supine in bed at SOS. Supine to sit with Mod I. Gait training with FWW and SBA * 300' Seated BLE therex * 15 reps * 2. Monitored pt's O2. Pt 100% on room air before and after activity. Pt resting in chair at EOS. Call bell in reach at exit.     Assessment:       Plan:   Continue to follow according to established plan of care.    The risks/benefits of therapy have been discussed with the patient/caregiver and he/she is in agreement with the established plan of care.     Therapist:   Arlyce Dice, PTA,  09/20/2018  Treatment Time In: 400p  Treatment Time Out: 423p  Time may include review of medical chart, obtaining patient's functional history from patient/family/medical staff/case management/ancillary personnel, collaboration on findings and treatment options (with the above mentioned individuals), re-assessment, and acute care rehabilitation.

## 2018-09-20 NOTE — PT Treatment (Signed)
[facility]  Physical Therapy Treatment Note    Patient Name: [name]  Date of Birth: 09/14/1946  Height:  152.4 cm (5')  Weight:  64.4 kg (142 lb)  Room/Bed: 5121/A  Payor: MEDICARE / Plan: MEDICARE PART A AND B / Product Type: Medicare /     Date/Time of Admission: 09/04/2018  2:15 PM  Admitting Diagnosis:  Anasarca [R60.1]    The patient was seen this session for 38 total billable minutes.  The following procedures were performed this session  Therapeutic Activity, ea 15 min (26415) and Gait Training, ea 15 min [phone]).      Subjective:     Patient with c/o headache and feeling light headed.       Objective:   Pt supine in bed at SOS. Supine to sit with S. STS to FWW with SBA. Gait to bathroom with FWW and SBA. S for toilet transfers. Pt Indep for hygiene of BM but Dep for donning brief. Gait training with FWW and SBA * 400' on room air. O2 obtained following and pt 100% on room air after activity however HR 126. After several minutes of rest HR still 110-115 range and dropped to 36 for 2-3 seconds. Nursing notified. Pt resting in chair and call bell in reach at PTA exit.   *Consulted with nursing re: several issues/concerns pt has including foul smelling diarrhea, PRN breathing tx, orders for continuous O2, Mucinex for congestion, and current HR and fluctuations. Nursing reported "I'm on my way there now."    Assessment:   Pt tol tx well however had increased HR post session. Pt demo's no S/S of distress.     Plan:   Continue to follow according to established plan of care.    The risks/benefits of therapy have been discussed with the patient/caregiver and he/she is in agreement with the established plan of care.     Therapist:   Arlyce Dice, PTA,  09/20/2018  Treatment Time In: 1037a  Treatment Time Out: 0768G  Time may include review of medical chart, obtaining patient's functional history from patient/family/medical staff/case management/ancillary personnel,  collaboration on findings and treatment options (with the above mentioned individuals), re-assessment, and acute care rehabilitation.

## 2018-09-20 NOTE — Care Plan (Signed)
Weight Change Nutrition F/U    [name], [name] 5121  27 lb/16.0% wt loss x 2 weeks. Pt is overweight per BMI standards.   Regular diet with 1000 mL Fluid Restriction (pt's Na+ remains decreased but has improved to 132 (L)), Mighty Shake 1/day with breakfast. PO intake is good.   Pt with anasarca dx, BLE edema, pt on Lasix 40 mg, Hydralazine, Spironolactone.   Continue current diet, supplement and fluid restriction. RD available as needed.   09/19/18 1500 64.4 kg (142 lb) Standing 0.5 % TD   09/18/18 1100 64.1 kg (141 lb 4.8 oz) Standing -0.1 % CB   09/18/18 0639 64.1 kg (141 lb 6.4 oz) Bed -0.6 % TT   09/17/18 0956 64.5 kg (142 lb 3.2 oz) Standing -6.6 % VE   09/17/18 0039 69.1 kg (152 lb 4.8 oz) Bed -3.3 % TT   09/14/18 0700 71.4 kg (157 lb 8 oz) Bed 0 % PN   09/13/18 0700 71.4 kg (157 lb 8 oz) Bed 0.3 % SK   09/12/18 0300 71.2 kg (157 lb) Bed -4.4 % TT   09/10/18 0630 74.5 kg (164 lb 3.2 oz) Bed 2.4 % NM   09/07/18 2300 72.8 kg (160 lb 6.4 oz) Bed -5.4 % TT   09/04/18 1700 76.9 kg (169 lb 9.6 oz) -- 0 % RM   09/04/18 1300 76.9 kg (169 lb 9.6 oz) -- -0.7 % CB     [name], RDLD  09/20/2018, 12:15  Pager [phone]

## 2018-09-21 ENCOUNTER — Ambulatory Visit
Admission: RE | Admit: 2018-09-21 | Discharge: 2018-09-21 | Disposition: A | Discharge status: Home / Self Care | Payer: Medicare Other | Source: Ambulatory Visit | Attending: Family | Admitting: Family

## 2018-09-21 DIAGNOSIS — R0602 Shortness of breath: Secondary | ICD-10-CM | POA: Insufficient documentation

## 2018-09-21 LAB — CBC WITH DIFF
BASOPHIL #: 0 x10ˆ3/uL (ref 0.00–0.20)
BASOPHIL %: 2 %
EOSINOPHIL #: 0.1 x10ˆ3/uL (ref 0.00–0.50)
EOSINOPHIL %: 6 %
HCT: 30.2 % — ABNORMAL LOW (ref 34.6–46.2)
HGB: 10.4 g/dL — ABNORMAL LOW (ref 11.8–15.8)
LYMPHOCYTE #: 0.6 x10ˆ3/uL — ABNORMAL LOW (ref 0.90–3.40)
LYMPHOCYTE %: 27 %
MCH: 31.3 pg (ref 27.6–33.2)
MCHC: 34.3 g/dL (ref 32.6–35.4)
MCV: 91.3 fL (ref 82.3–96.7)
MONOCYTE #: 0.3 x10ˆ3/uL (ref 0.20–0.90)
MONOCYTE %: 15 %
MPV: 9.8 fL (ref 6.6–10.2)
NEUTROPHIL #: 1 x10ˆ3/uL — ABNORMAL LOW (ref 1.50–6.40)
NEUTROPHIL %: 50 %
PLATELETS: 104 x10ˆ3/uL — ABNORMAL LOW (ref 140–440)
RBC: 3.3 x10ˆ6/uL — ABNORMAL LOW (ref 3.80–5.24)
RDW: 14.6 % (ref 12.4–15.2)
WBC: 2.1 x10ˆ3/uL — ABNORMAL LOW (ref 3.5–10.3)

## 2018-09-21 LAB — BASIC METABOLIC PANEL
ANION GAP: 6 mmol/L
BUN/CREA RATIO: 11
BUN: 10 mg/dL (ref 10–25)
CALCIUM: 7.8 mg/dL — ABNORMAL LOW (ref 8.8–10.3)
CHLORIDE: 100 mmol/L (ref 98–111)
CO2 TOTAL: 25 mmol/L (ref 21–35)
CREATININE: 0.87 mg/dL (ref <=1.30)
ESTIMATED GFR: >60 mL/min/1.73mˆ2
GLUCOSE: 158 mg/dL — ABNORMAL HIGH (ref 70–110)
POTASSIUM: 5.1 mmol/L — ABNORMAL HIGH (ref 3.5–5.0)
SODIUM: 131 mmol/L — ABNORMAL LOW (ref 135–145)

## 2018-09-21 LAB — POC BLOOD GLUCOSE (RESULTS)
GLUCOSE, POC: 102 mg/dL (ref 70–110)
GLUCOSE, POC: 303 mg/dL — ABNORMAL HIGH (ref 70–110)
GLUCOSE, POC: 46 mg/dL — ABNORMAL LOW (ref 70–110)
GLUCOSE, POC: 175 mg/dL — ABNORMAL HIGH (ref 70–110)
GLUCOSE, POC: 175 mg/dl — ABNORMAL HIGH (ref 70–110)
GLUCOSE, POC: 53 mg/dL — ABNORMAL LOW (ref 70–110)
GLUCOSE, POC: 157 mg/dL — ABNORMAL HIGH (ref 70–110)
GLUCOSE, POC: 334 mg/dL — ABNORMAL HIGH (ref 70–110)
GLUCOSE, POC: 339 mg/dL — ABNORMAL HIGH (ref 70–110)

## 2018-09-21 LAB — ECG 12 LEAD - ADULT
Calculated P Axis: 68 deg
Calculated P Axis: 68 deg
Calculated T Axis: 63 deg
EKG Severity: BORDERLINE
Heart Rate: 66 {beats}/min
I 40 Axis: 70 deg
PR Interval: 200 ms
QRS Axis: -14 deg
QRS Duration: 96 ms
QT Interval: 409 ms
QT Interval: 409 ms
QTC Calculation: 429 ms
ST Axis: 119 deg
T 40 Axis: -31 deg

## 2018-09-21 LAB — TROPONIN-I: TROPONIN I: 0.01 ng/mL (ref <=0.04)

## 2018-09-21 MED ORDER — DEXTROSE 5 % IN WATER (D5W) INTRAVENOUS SOLUTION
INTRAVENOUS | Status: DC
Start: 2018-09-21 — End: 2018-09-22
  Administered 2018-09-21: 0 via INTRAVENOUS

## 2018-09-21 MED ADMIN — sodium chloride 0.9 % intravenous solution: INTRAVENOUS | @ 15:00:00 | NDC 00338004904

## 2018-09-21 MED ADMIN — lactated Ringers intravenous solution: ORAL | @ 23:00:00

## 2018-09-21 MED ADMIN — econazole 1 % topical cream: ORAL | @ 08:00:00 | NDC 47781054372

## 2018-09-21 MED ADMIN — bacitracin 500 unit-polymyxin B 10,000 unit/gram topical ointment: ORAL | @ 14:00:00 | NDC 0081079888

## 2018-09-21 NOTE — Progress Notes (Signed)
Hospitalist   Progress Note    [name]       72 y.o.       Date of service: 09/21/2018  Date of Admission:  09/04/2018    Hospital Day:  LOS: 17 days   CC: No chief complaint on file.      Subjective:  Patient lying in bed-appears acutely ill.  Had 2 episodes of hypoglycemia since lunch and has been given D50.  States she just does not feel well.    Objective:   Filed Vitals:    09/20/18 1700 09/20/18 2100 09/21/18 0625 09/21/18 1700   BP: 134/60  (!) 135/58 (!) 149/99   Pulse: 77  68 72   Resp: 16  20 18    Temp: 36.8 C (98.2 F)  36.4 C (97.5 F) 36.3 C (97.3 F)   SpO2: 95% 97% 100% 98%     I have reviewed the vitals.    Physical Exam:  Constitutional: Patient is in no acute distress.  Appears acutely ill, pale, clammy  HEENT: Head normocephalic and atraumatic.   Lungs: Clear to auscultation bilaterally without wheezes, rales or rhonchi.  Cardiovascular: Regular rate and rhythm.   Abdomen: Normal bowel sounds. Soft, nontender, nondistended.   MSK/Extremities: Moves all extremities. Pulses equal bilaterally.  Trace bilateral lower extremity edema  Skin: No lesions noted.  No rashes.  Neuro: Alert and oriented X 3. CNs 2-12 grossly intact. No facial droop noted.        Labs:  Lab Results Today:    Results for orders placed or performed during the hospital encounter of 09/04/18 (from the past 24 hour(s))   POC BLOOD GLUCOSE (RESULTS)   Result Value Ref Range    GLUCOSE, POC 215 (H) 70 - 110 mg/dl   POC BLOOD GLUCOSE (RESULTS)   Result Value Ref Range    GLUCOSE, POC 102 70 - 110 mg/dl   POC BLOOD GLUCOSE (RESULTS)   Result Value Ref Range    GLUCOSE, POC 303 (H) 70 - 110 mg/dl   POC BLOOD GLUCOSE (RESULTS)   Result Value Ref Range    GLUCOSE, POC 46 (L) 70 - 110 mg/dl   POC BLOOD GLUCOSE (RESULTS)   Result Value Ref Range    GLUCOSE, POC 53 (L) 70 - 110 mg/dl   POC BLOOD GLUCOSE (RESULTS)   Result Value Ref Range    GLUCOSE, POC 175 (H) 70 - 110 mg/dl     I have reviewed all labs.    Micro:   No results  found for any visits on 09/04/18 (from the past 96 hour(s)).    Radiology:       Assessment/ Plan:   Active Hospital Problems    Diagnosis   . Anasarca     Hypoglycemia  -Started D5W at 100  -Monitor blood sugars  -DC sliding scale insulin for now    Weakness and deconditioning  -continue PT and OT    Hyponatremia  -stable, improving  -continue fluid restriction  -continue to have nephrology following    Anasarca  -improved  -multifactorial likely due to possible liver cirrhosis and diastolic CHF  -status post Whipple procedure  -will need outpatient GI referral at discharge for right upper quadrant ultrasound showing hepatomegaly and minimal changes consistent with cirrhosis    Pancreatic cancer  -status post robotic surgery and chemo radiation  -stop chemo in July 2018 due to side effects  -follow up with Oncology as outpatient    Hypertension  -continue  losartan, metoprolol, hydralazine    Recent PE  -continue Eliquis    Recent NSTEMI  -troponin peaked at 0.46  -follow up with Cardiology as an outpatient    Orthostatic hypotension  -Ted hose  -instructed patient to change positions slowly  -continue to monitor    DVT/PE Prophylaxis: Apixaban    Disposition Planning: Home discharge  and [place], FNP-BC  09/21/2018, 18:20

## 2018-09-21 NOTE — Nurses Notes (Signed)
Text paged hospitalist night cross.  [address] [place], [place].  Pt blood sugar now is 334. Do you want me to shut off the D5 and continue to monitor on Q4 finger sticks?  What would you like me to do.  Vitals were good.  [phone]  .s

## 2018-09-21 NOTE — Nurses Notes (Signed)
Patient presenting with diaphoresis and complaints of not feeling well, FSBS 53. IV glucose given per PRN orders for blood sugar less than 70. Recheck after 10 minutes with blood glucose reading 175. D5W @100  ml/hr initiated via accessed port to right chest.

## 2018-09-21 NOTE — PT Treatment (Signed)
[place]  Physical Therapy Treatment Note    Patient Name: [name]  Date of Birth: 1946-02-16  Height:  152.4 cm (5')  Weight:  63.1 kg (139 lb 3.2 oz)  Room/Bed: 5121/A  Payor: MEDICARE / Plan: MEDICARE PART A AND B / Product Type: Medicare /     Date/Time of Admission: 09/04/2018  2:15 PM  Admitting Diagnosis:  Anasarca [R60.1]    The patient was seen this session for 25 total billable minutes.  The following procedures were performed this session  Therapeutic Exercise, ea 15 min (97110) and Gait Training, ea 15 min [phone]).      Subjective:     Patient with c/o no pain. Pt reports headache is "gone for now"      Objective:   Pt sitting in bedside chair at [place]. STS to [place] with Mod I. Gait training with FWW and SBA * 400' and 300' Seated BLE therex * 15 reps each. Pt resting in chair and call bell in reach at PTA exit.     Assessment:   Pt improved ambulatory distance this date.     Plan:   Continue to follow according to established plan of care.    The risks/benefits of therapy have been discussed with the patient/caregiver and he/she is in agreement with the established plan of care.     Therapist:   Arlyce Dice, PTA,  09/21/2018  Treatment Time In: 1110a  Treatment Time Out: 3953  Time may include review of medical chart, obtaining patient's functional history from patient/family/medical staff/case management/ancillary personnel, collaboration on findings and treatment options (with the above mentioned individuals), re-assessment, and acute care rehabilitation.

## 2018-09-21 NOTE — Nurses Notes (Signed)
Dr Netta Neat said to recheck in 4 hours around 2 or 2:30am.    Yehuda Budd, RN  09/21/2018, 22:22

## 2018-09-21 NOTE — PT Treatment (Signed)
[facility]  Physical Therapy Treatment Note    Patient Name: [name]  Date of Birth: 1946/07/28  Height:  152.4 cm (5')  Weight:  63.1 kg (139 lb 3.2 oz)  Room/Bed: 5121/A  Payor: MEDICARE / Plan: MEDICARE PART A AND B / Product Type: Medicare /     Date/Time of Admission: 09/04/2018  2:15 PM  Admitting Diagnosis:  Anasarca [R60.1]    The patient was seen this session for 29 total billable minutes.  The following procedures were performed this session  Therapeutic Activity, ea 15 min (10175) and Gait Training, ea 15 min [phone]).      Subjective:     Patient with c/o dizziness and headache pain.      Objective:   Pt supine in bed at SOS. Supine to sit with S. Gait training with FWW and SBA * 200' and 150' Pt returned to bed at EOS. Pt reports feeling hot and clammy with dizziness. Vitals obtained BP 110/60, HR 38-75 and O2 100% on room air. Nursing notified and sugar checked. Pt was at 46 and given dextrose. Pt resting in bed and cool washcloth applied to forehead and back of neck. Call bell in reach and nursing present.     Assessment:   Pt had episode of low blood sugars this date.     Plan:   Continue to follow according to established plan of care.    The risks/benefits of therapy have been discussed with the patient/caregiver and he/she is in agreement with the established plan of care.     Therapist:   Arlyce Dice, PTA,  09/21/2018  Treatment Time In: 43p  Treatment Time Out: 300p  Time may include review of medical chart, obtaining patient's functional history from patient/family/medical staff/case management/ancillary personnel, collaboration on findings and treatment options (with the above mentioned individuals), re-assessment, and acute care rehabilitation.

## 2018-09-21 NOTE — OT Treatment (Signed)
[place]  Occupational Therapy Treatment Note    Patient Name: [name]  Date of Birth: 10-Apr-1946  Height:  152.4 cm (5')  Weight:  63.1 kg (139 lb 3.2 oz)  Room/Bed: 5121/A  Payor: MEDICARE / Plan: MEDICARE PART A AND B / Product Type: Medicare /     Date/Time of Admission: 09/04/2018  2:15 PM  Admitting Diagnosis:  Anasarca [R60.1]    The patient was seen this session for 33 total billable minutes.  The following procedures were performed this session Therapeutic Exercise, ea 15 min (48185).      Subjective:     Patient with c/o 4/10 headache pain. Reports "It was really bad this morning, I had to lay back down."       Objective:   Patient supine in bed upon arrival and agreeable to OT with encouragement. Requested to use the restroom, on RA and reports not needing it at anytime in last 24 hours or so. Patient completed toileting mod I, and grooming at the sink including brushing her teeth and washing hands independently. Patient then ambulated into room and agreed to sit in recliner for UB exercises. Patient completed 3# therabar exercises for chest press, shoulder press, bicep curl and forward/backward circumduction 2 x 10 reps. Also completed green digiflex and red flexbar exercises for pronation < > supination 3 x 10 reps. Left in recliner with call bell and phone within reach.      Assessment:   Patient continues to have headaches impacting her ability to get up and out of bed for lengths of time.     Plan:   Continue to follow according to established plan of care.    The risks/benefits of therapy have been discussed with the patient/caregiver and he/she is in agreement with the established plan of care.     Therapist:   Hubbard Robinson, OT,  09/21/2018  Treatment Time: 33 minutes

## 2018-09-21 NOTE — Nurses Notes (Signed)
Therapy called, patient diaphoretic, light-headed and complaining of not feeling well. Therapy took vitals, reported as normal. This nurse checked blood sugar, blood glucose 46. IV glucose given per PRN orders for blood sugar less than 70. Recheck after 10 minutes with blood glucose reading 166. Patient no longer visibly diaphoretic, still reports feeling tired.

## 2018-09-21 NOTE — OT Treatment (Signed)
[place]  Occupational Therapy Treatment Note    Patient Name: [name]  Date of Birth: [birth year]  Height:  152.4 cm (5')  Weight:  63.1 kg (139 lb 3.2 oz)  Room/Bed: 5121/A  Payor: MEDICARE / Plan: MEDICARE PART A AND B / Product Type: Medicare /     Date/Time of Admission: 09/04/2018  2:15 PM  Admitting Diagnosis:  Anasarca [R60.1]    The patient was seen this session for 24 total billable minutes.  The following procedures were performed this session Therapeutic Exercise, ea 15 min (42683).      Subjective:     Patient with c/o mild headache pain. States "it just never seems to go away."      Objective:   Patient sitting in bed upon arrival, finishing up with nursing who reports patient is able to trial room air as tolerated, reports 100% when checked on RA. Patient denies getting OOB, states she just returned to be recently but would perform UB therex as she feels its her "weak point." Increased resistance and weights this date to 3# free wt. and green flexbar with moderate difficulty and reduced # of reps. Patient completed chest press, bicep curls, shoulder press and attempted shoulder flexion with 3# wt however unable to perform flexion, 2 x 10 reps with breaks PRN. Also completed green digiflex and green flexbar for pronation < > supination 3 x 10 reps. Left in supine on RA with call bell, 02 and phone within reach.    Assessment:   Patient continues to deny OOB activities on occasion however does work with therapy at some point during the day.     Plan:   Continue to follow according to established plan of care.    The risks/benefits of therapy have been discussed with the patient/caregiver and he/she is in agreement with the established plan of care.     Therapist:   Hubbard Robinson, OT,  09/21/2018  Treatment Time: 24 minutes

## 2018-09-21 NOTE — Nurses Notes (Signed)
Sent text page to Dr Mikeal Hawthorne on recheck of Blood sugar that she wanted.  [address] [place], V.  Pt BS is 339.  Just a few points up from 334 prior.  [phone]

## 2018-09-21 NOTE — Nurses Notes (Signed)
I received a call to assess patient's port dressing.  IV team not made aware that patient had a port or that it was accessed.  Upon assessment, port was last accessed on 09/03/2018.  Charting shows chlorhexidine dressing for over 2.5 weeks.  Site had multiple pieces of tape, reinforcing dressing; there was not a chlorhexidine dressing on site. I cleaned site thoroughly, and re-accessed with a power huber. Producer, television/film/video informed to be aware of new patient's and their central line dressing dates.  Charis Juliana Demary-Andrew, RN  09/21/2018, 15:46

## 2018-09-22 LAB — POC BLOOD GLUCOSE (RESULTS)
GLUCOSE, POC: 136 mg/dL — ABNORMAL HIGH (ref 70–110)
GLUCOSE, POC: 187 mg/dL — ABNORMAL HIGH (ref 70–110)
GLUCOSE, POC: 344 mg/dL — ABNORMAL HIGH (ref 70–110)
GLUCOSE, POC: 248 mg/dL — ABNORMAL HIGH (ref 70–110)
GLUCOSE, POC: 166 mg/dL — ABNORMAL HIGH (ref 70–110)
GLUCOSE, POC: 227 mg/dL — ABNORMAL HIGH (ref 70–110)

## 2018-09-22 MED ORDER — DEXTROSE 5 % IN WATER (D5W) INTRAVENOUS SOLUTION
INTRAVENOUS | Status: DC
Start: 2018-09-22 — End: 2018-09-22

## 2018-09-22 MED ADMIN — sodium chloride 0.9 % (flush) injection syringe: @ 06:00:00

## 2018-09-22 MED ADMIN — potassium chloride 20 mEq/L in dextrose 5 %-0.45 % sodium chloride IV: RECTAL | @ 12:00:00 | NDC 00338067104

## 2018-09-22 NOTE — Nurses Notes (Signed)
Charge nurse Mateo Flow made aware of bl sugar 344. Asymptomatic. Mateo Flow phoned back and said do not administer coverage, but dc IV fluids of D5W PER ORDER. Continue bl sugar checks q 4 hours.

## 2018-09-22 NOTE — Nurses Notes (Signed)
Alerted physician to current status and went over her previous blood sugar for when this started and where she is now.  Dr Netta Neat stated she is putting to put her back on D5 at 50cc's per hour at this time and check her in a couple of hours.    Yehuda Budd, RN  09/22/2018, 02:55

## 2018-09-22 NOTE — Nurses Notes (Signed)
Paged K. Snodgrass NP at this time to let know FS 344. Ladora Daniel, RN

## 2018-09-22 NOTE — Nurses Notes (Signed)
Sent text page update to Dr. Netta Neat.  [place] blood sugar is 136 now.  Will recheck am before change of shift.  [phone]  Yehuda Budd, RN  09/22/2018, 02:50

## 2018-09-23 LAB — POC BLOOD GLUCOSE (RESULTS)
GLUCOSE, POC: 118 mg/dL — ABNORMAL HIGH (ref 70–110)
GLUCOSE, POC: 101 mg/dL (ref 70–110)
GLUCOSE, POC: 110 mg/dL (ref 70–110)
GLUCOSE, POC: 247 mg/dL — ABNORMAL HIGH (ref 70–110)
GLUCOSE, POC: 185 mg/dL — ABNORMAL HIGH (ref 70–110)
GLUCOSE, POC: 318 mg/dL — ABNORMAL HIGH (ref 70–110)

## 2018-09-23 MED ADMIN — citalopram 20 mg tablet: ORAL | @ 08:00:00

## 2018-09-23 MED ADMIN — albuterol sulfate 2.5 mg/3 mL (0.083 %) solution for nebulization: RESPIRATORY_TRACT | @ 06:00:00

## 2018-09-23 MED ADMIN — nystatin 100,000 unit/gram topical powder: TOPICAL | @ 14:00:00

## 2018-09-23 MED ADMIN — potassium chloride 20 mEq/L in dextrose 5 %-0.45 % sodium chloride IV: ORAL | @ 08:00:00 | NDC 00338067104

## 2018-09-23 MED ADMIN — sodium chloride 0.9 % (flush) injection syringe: RECTAL

## 2018-09-23 MED ADMIN — sodium chloride 0.9 % intravenous solution: TOPICAL | @ 21:00:00 | NDC 00338004904

## 2018-09-24 LAB — POC BLOOD GLUCOSE (RESULTS)
GLUCOSE, POC: 104 mg/dL (ref 70–110)
GLUCOSE, POC: 121 mg/dL — ABNORMAL HIGH (ref 70–110)
GLUCOSE, POC: 154 mg/dL — ABNORMAL HIGH (ref 70–110)
GLUCOSE, POC: 166 mg/dL — ABNORMAL HIGH (ref 70–110)
GLUCOSE, POC: 171 mg/dL — ABNORMAL HIGH (ref 70–110)
GLUCOSE, POC: 237 mg/dL — ABNORMAL HIGH (ref 70–110)
GLUCOSE, POC: 340 mg/dL — ABNORMAL HIGH (ref 70–110)
GLUCOSE, POC: 356 mg/dL — ABNORMAL HIGH (ref 70–110)
GLUCOSE, POC: 382 mg/dL — ABNORMAL HIGH (ref 70–110)

## 2018-09-24 LAB — CBC
HCT: 27.5 % — ABNORMAL LOW (ref 34.6–46.2)
HGB: 9.4 g/dL — ABNORMAL LOW (ref 11.8–15.8)
MCH: 31.3 pg (ref 27.6–33.2)
MCHC: 34.1 g/dL (ref 32.6–35.4)
MCV: 91.6 fL (ref 82.3–96.7)
MPV: 10 fL (ref 6.6–10.2)
PLATELETS: 126 10*3/uL — ABNORMAL LOW (ref 140–440)
RBC: 3 x10ˆ6/uL — ABNORMAL LOW (ref 3.80–5.24)
RDW: 14.3 % (ref 12.4–15.2)
WBC: 2.6 10*3/uL — ABNORMAL LOW (ref 3.5–10.3)
WBC: 2.6 10*3/uL — ABNORMAL LOW (ref 3.5–10.3)

## 2018-09-24 LAB — BASIC METABOLIC PANEL
ANION GAP: 4 mmol/L
BUN/CREA RATIO: 13
BUN: 10 mg/dL (ref 10–25)
CALCIUM: 7.8 mg/dL — ABNORMAL LOW (ref 8.8–10.3)
CHLORIDE: 101 mmol/L (ref 98–111)
CO2 TOTAL: 26 mmol/L (ref 21–35)
CREATININE: 0.76 mg/dL (ref <=1.30)
ESTIMATED GFR: >60 mL/min/{1.73_m2}
GLUCOSE: 98 mg/dL (ref 70–110)
POTASSIUM: 4.6 mmol/L (ref 3.5–5.0)
SODIUM: 131 mmol/L — ABNORMAL LOW (ref 135–145)
SODIUM: 131 mmol/L — ABNORMAL LOW (ref 135–145)

## 2018-09-24 LAB — PHOSPHORUS: PHOSPHORUS: 3.6 mg/dL (ref 2.5–4.5)

## 2018-09-24 LAB — MAGNESIUM: MAGNESIUM: 1.3 mg/dL — ABNORMAL LOW (ref 1.8–2.3)

## 2018-09-24 MED ORDER — MAGNESIUM SULFATE 500 MG/ML (50 %) INJECTION SOLUTION
2.0000 g | Freq: Once | INTRAVENOUS | Status: AC
Start: 2018-09-24 — End: 2018-09-24
  Administered 2018-09-24: 0 g via INTRAVENOUS
  Administered 2018-09-24: 2 g via INTRAVENOUS
  Filled 2018-09-24: qty 4

## 2018-09-24 MED ADMIN — spironolactone 50 mg tablet: ORAL | @ 09:00:00

## 2018-09-24 MED ADMIN — cefOXitin 2 gram intravenous solution: ORAL | @ 09:00:00

## 2018-09-24 MED ADMIN — lactated Ringers intravenous solution: ORAL | @ 06:00:00 | NDC 00264775000

## 2018-09-24 MED ADMIN — nystatin 100,000 unit/gram topical cream: ORAL | @ 21:00:00 | NDC 51672128901

## 2018-09-24 MED ADMIN — albumin, human 5 % intravenous solution: ORAL | @ 09:00:00

## 2018-09-24 NOTE — Care Management Notes (Signed)
[facility]  Discharge Notice    [name]  No discharge date for patient encounter.09/25/18  5121/A      TRANSPORTED BY:     _0  Ambulance:               Phone #                         _1  Personal Car: Sister Phone # [phone]     _2  Other:                                                                DESTINATION:  home                              EQUIPMENT NEEDED: VENDOR AND PHONE TO [place]      _3  O2          _4  Wheelchair        _5  Walker   [facility]  Ph: [phone]  Fax: [phone]  Please deliver a Seated Rolator Walker to room [place], [name] before d/c on Tuesday, 09/25/18    _6   Potty Chair        _7  Hosp Bed         _8   Other                      AGENCY ASSISTANCE:    AGENCY & PHONE #     _9  Meals on Wheels        _10  [place]  Ph: [phone]  Fax: [phone]  Pt, OT SN, symptom management  PCP: Isla Pence  D/c Tuesday, _11 _12  [place]        _13   [place]        _14   Medicaid Waiver        _15  Hospice        _16  Other                NURSING PROCEDURES:        _17  Doctor's Orders: Phys in to sign:            Called for Orders:                 _18  Transfer form/Discharge Summary      _19  RX's to resident/family      _20  Personal belongings form signed by res./family      _21  Home Health referral complete/transfer form copy attached      _22  Copy of PO2 level for O2 Patients      _23  Copy MDS + to LTC facility/hospice    [facility]  Notice of Transfer or Discharge    Resident Name/Responsible Party: [name]  Date: 09/24/2018    Due to the reason indicated below, discharge or transfer from [facility] will be necessary.    _24   The transfer or discharge is appropriate because your health has improved sufficiently so you no longer need the services  provided by this facility.     _0  The transfer or discharge is necessary for your welfare and the  resident's needs cannot be met in this facility.    _1  The safety of other individuals in the facility are endangered.    _2  The health of other individuals in the facility are endangered.     _3  You have failed after reasonable and appropriate notice to pay for (or to have paid under Medicare) a stay at the facility.      Effective date of transfer: 09/25/18  Destination of transfer: home.    If you have any questions regarding this action, please contact our social worker at your earliest convenience.    YOU HAVE THE RIGHT TO APPEAL THIS ACTION TO:     Creta Levin- Board of Review  [facility]  [address]  [place], [place] [address]  [phone]  [email or web address]    YOU HAVE THE RIGHT TO VOICE CONCERNS TO :    [facility] Department of Health              Disability Rights of [place]              [address]  Licensure & Certification               [facility]  [address] [place], [place] [address]  [place] [address]           [phone] or [phone]  [phone]                                  [email or web address]  [email or web address]      [facility] [address] [place], [address], [place]  [place], [place] [address]  [email or web address]      The name of those filing complaints will be kept confidential.    Notified by: Nicholes Mango                   Date of Verbal notification: 09/24/18                                                               Date of Written notification: 09/25/18

## 2018-09-24 NOTE — Care Plan (Signed)
CARE PLAN HAS BEEN COMPLETED AND WILL BE UPDATED AS NEEDED

## 2018-09-24 NOTE — OT Treatment (Signed)
[place]  Occupational Therapy Treatment Note    Patient Name: [name]  Date of Birth: 1946-01-12  Height:  152.4 cm (5')  Weight:  61.7 kg (136 lb 1.6 oz)  Room/Bed: 5121/A  Payor: MEDICARE / Plan: MEDICARE PART A AND B / Product Type: Medicare /     Date/Time of Admission: 09/04/2018  2:15 PM  Admitting Diagnosis:  Anasarca [R60.1]    The patient was seen this session for 47 total billable minutes.  The following procedures were performed this session ADL/Self Care, ea 15 min (28638).      Subjective:     Patient with c/o 4/10 back pain.      Objective:   Patient sitting in bed upon arrival and agreeable to OT. Patient sat at EOB, reports minimal to no dizziness upon sitting up. Patient getting meds prior to ADL and reported needing to use the restroom urgently. Patient ambulated into R/R with CGA and DEP to remove soiled brief, having mild accident on way to restroom. Patient had large soft, foul smelling odor BM, able to wipe independently. CGA to transfer into shower and mod I for bathing from seated position. Patient able to dress with overhead shirt, pants, and socks independently, DEP for brief. Ambulated back to recliner and left with call bell and phone within reach.    Assessment:   Patient progressing well, tolerated session without 02 just fine, minimal fatigue after shower.     Plan:   Continue to follow according to established plan of care.    The risks/benefits of therapy have been discussed with the patient/caregiver and he/she is in agreement with the established plan of care.     Therapist:   Hubbard Robinson, OT,  09/24/2018  Treatment Time: 47 minutes

## 2018-09-24 NOTE — PT Treatment (Signed)
[place]  Physical Therapy Treatment Note    Patient Name: [name]  Date of Birth: Jun 15, 1946  Height:  152.4 cm (5')  Weight:  61.7 kg (136 lb 1.6 oz)  Room/Bed: 5121/A  Payor: MEDICARE / Plan: MEDICARE PART A AND B / Product Type: Medicare /     Date/Time of Admission: 09/04/2018  2:15 PM  Admitting Diagnosis:  Anasarca [R60.1]    The patient was seen this session for 29 total billable minutes.  The following procedures were performed this session  Gait Training, ea 15 min [phone]).      Subjective:     Patient with c/o no pain.      Objective:   Pt supine in bed at SOS. Supine to sit with Mod I. Gait training with FWW and S 300' * 2. Stair training with BHR and SBA 4 steps * 2 trials. Sit to supine with S. Call bell in reach at PTA exit.     Assessment:   Pt became fatigued with stair training.    Plan:   Continue to follow according to established plan of care.    The risks/benefits of therapy have been discussed with the patient/caregiver and he/she is in agreement with the established plan of care.     Therapist:   Arlyce Dice, PTA,  09/24/2018  Treatment Time In: 229  Treatment Time Out: 58  Time may include review of medical chart, obtaining patient's functional history from patient/family/medical staff/case management/ancillary personnel, collaboration on findings and treatment options (with the above mentioned individuals), re-assessment, and acute care rehabilitation.

## 2018-09-24 NOTE — PT Treatment (Signed)
[place]  Physical Therapy Treatment Note    Patient Name: [name]  Date of Birth: 29-Nov-1946  Height:  152.4 cm (5')  Weight:  61.7 kg (136 lb 1.6 oz)  Room/Bed: 5121/A  Payor: MEDICARE / Plan: MEDICARE PART A AND B / Product Type: Medicare /     Date/Time of Admission: 09/04/2018  2:15 PM  Admitting Diagnosis:  Anasarca [R60.1]    The patient was seen this session for 15 total billable minutes.  The following procedures were performed this session  Therapeutic Exercise, ea 15 min (41583).      Subjective:     Patient states "I have a little bit of a headache."      Objective:   Pt sitting in bedside chair at [place]. Pt receiving IV magnesium and fluids at this time. Pt has had fluctuations with blood sugar over the weekend. Pt reports "they said my electrolytes were low". Pt performed seated BLE therex 15 reps * 2. Deferred gait training this AM. Pt resting in chair and call bell in reach at PTA exit.     Assessment:       Plan:   Continue to follow according to established plan of care.    The risks/benefits of therapy have been discussed with the patient/caregiver and he/she is in agreement with the established plan of care.     Therapist:   Arlyce Dice, PTA,  09/24/2018  Treatment Time In: 1052a  Treatment Time Out: 0940H  Time may include review of medical chart, obtaining patient's functional history from patient/family/medical staff/case management/ancillary personnel, collaboration on findings and treatment options (with the above mentioned individuals), re-assessment, and acute care rehabilitation.

## 2018-09-25 ENCOUNTER — Other Ambulatory Visit (INDEPENDENT_AMBULATORY_CARE_PROVIDER_SITE_OTHER): Payer: Self-pay | Admitting: Family

## 2018-09-25 DIAGNOSIS — I509 Heart failure, unspecified: Secondary | ICD-10-CM

## 2018-09-25 DIAGNOSIS — C259 Malignant neoplasm of pancreas, unspecified: Secondary | ICD-10-CM

## 2018-09-25 DIAGNOSIS — R601 Generalized edema: Secondary | ICD-10-CM

## 2018-09-25 LAB — POC BLOOD GLUCOSE (RESULTS)
GLUCOSE, POC: 202 mg/dL — ABNORMAL HIGH (ref 70–110)
GLUCOSE, POC: 228 mg/dL — ABNORMAL HIGH (ref 70–110)
GLUCOSE, POC: 262 mg/dL — ABNORMAL HIGH (ref 70–110)

## 2018-09-25 MED ORDER — SPIRONOLACTONE 50 MG TABLET
50.00 mg | ORAL_TABLET | Freq: Every morning | ORAL | 0 refills | Status: DC
Start: 2018-09-25 — End: 2018-10-29

## 2018-09-25 MED ORDER — METOPROLOL SUCCINATE ER 50 MG TABLET,EXTENDED RELEASE 24 HR
25.0000 mg | ORAL_TABLET | Freq: Every day | ORAL | Status: DC
Start: 2018-09-25 — End: 2019-04-19

## 2018-09-25 MED ORDER — HYDROCORTISONE 1 %-PRAMOXINE 1 % RECTAL FOAM: 1 | g | Freq: Four times a day (QID) | RECTAL | 0 refills | 0 days | Status: AC

## 2018-09-25 MED ORDER — FUROSEMIDE 20 MG TABLET
40.0000 mg | ORAL_TABLET | Freq: Every day | ORAL | 0 refills | Status: DC
Start: 2018-09-25 — End: 2020-02-06

## 2018-09-25 MED ORDER — NYSTATIN 100,000 UNIT/GRAM TOPICAL POWDER: 1 | g | Freq: Three times a day (TID) | CUTANEOUS | 0 refills | 0 days | Status: DC

## 2018-09-25 MED ORDER — APIXABAN 2.5 MG TABLET
2.50 mg | ORAL_TABLET | Freq: Two times a day (BID) | ORAL | 1 refills | Status: DC
Start: 2018-09-25 — End: 2019-04-19

## 2018-09-25 MED ORDER — HYDRALAZINE 10 MG TABLET: 10 mg | Tab | Freq: Three times a day (TID) | ORAL | 0 refills | 0 days | Status: AC

## 2018-09-25 MED ADMIN — HYDROcodone 5 mg-acetaminophen 325 mg tablet: ORAL | @ 12:00:00

## 2018-09-25 MED ADMIN — sodium chloride 0.9 % intravenous solution: ORAL | @ 12:00:00

## 2018-09-25 MED ADMIN — sodium chloride 0.9 % intravenous solution: @ 14:00:00

## 2018-09-25 NOTE — PT Treatment (Addendum)
LTC Physical Therapy Discharge  Rehabilitation Services          Patient Name: [name]  Date of Birth: 1946/12/05  Height: Height: 152.4 cm (5')  Weight: Weight: 61.7 kg (136 lb 1.6 oz)  Room/Bed: 5121/A  Payor: Payor: MEDICARE / Plan: MEDICARE PART A AND B / Product Type: Medicare /     Admission Diagnosis: Anasarca [R60.1]      Assessment:      Pt has been seen daily in SNF for rehab following H for anasarca d/t CHF and liver cirrhosis.  She has participated in daily gait training, transfer/mobility training and strengthening/ROM therex.  She has progressed to an indep level for all transfers and household distance gait with fww -- she tolerates over 150' continuous with fww and SVA.  Pt has struggled with bouts of dizziness and weakness r/t blood pressure / hemoglobin and did sustain fall when trying to get up independently in bathroom.  It has been recommended to both pt and family for pt to have Supervision assistance at all times during functional activity given she has extensive falls history prior to admission.  Pt instructed to use fww at all times at home.  Pt has made good progress toward established goals and is being d/c to home with sister at this time.  She is to continue with home health services.  Pt d/c at this time.       Creta Levin, PT  09/25/2018, 12:46

## 2018-09-25 NOTE — Discharge Summary (Signed)
DISCHARGE SUMMARY      PATIENT NAME:  [name], [name]  MRN:  [id number]  DOB:  07-19-1946    ADMISSION DATE:  09/04/2018  DISCHARGE DATE:  09/25/2018    ATTENDING PHYSICIAN:Ramanathan, Venetia Night*  SERVICE: [facility]  PRIMARY CARE PHYSICIAN: Ignacia Felling, MD (Inactive)     Reason for Admission     Diagnosis                                Anasarca [phone]          DISCHARGE DIAGNOSIS:     Principal Problem:  Anasarca    Active Hospital Problems    Diagnosis Date Noted   . Anasarca [R60.1] 08/25/2018      Resolved Hospital Problems   No resolved problems to display.     Active Non-Hospital Problems    Diagnosis Date Noted   . PE (pulmonary thromboembolism) (CMS HCC) 03/29/2018   . Hypokalemia 11/23/2017   . Open abdominal wall wound 11/22/2017   . Pancreatic cancer (CMS [place]) 10/25/2017   . Asthma 01/24/2017   . Type 2 diabetes mellitus (CMS HCC) 01/24/2017      Allergies   Allergen Reactions   . Sulfa (Sulfonamides) Itching        INVESTIGATIONS:  Labs:      No results for input(s): MAGNESIUM, PHOSPHORUS in the last 24 hours.  No results for input(s): INR, APTT in the last 24 hours.  TROPONIN I   Date Value Ref Range Status   09/21/2018 0.01 <=0.04 ng/mL Final   09/03/2018 0.01 <=0.04 ng/mL Final   09/03/2018 0.01 <=0.04 ng/mL Final         Imaging:         DISCHARGE MEDICATIONS:     Current Discharge Medication List      START taking these medications.      Details   apixaban 2.5 mg Tablet  Commonly known as:  ELIQUIS   2.5 mg, Oral, 2 TIMES DAILY  Qty:  60 Tab  Refills:  1     hydrALAZINE 10 mg Tablet  Commonly known as:  APRESOLINE   10 mg, Oral, EVERY 8 HOURS (SCHEDULED)  Qty:  90 Tab  Refills:  0     hydrocortisone-pramoxine 1-1 % Foam  Commonly known as:  PROCTOFOAM HC   1 Applicator, Rectal, EVERY 6 HOURS  Qty:  10 g  Refills:  0     nystatin 100,000 unit/gram Powder  Commonly known as:  NYSTOP   1 Applicator, Apply Topically, 3 TIMES DAILY  Qty:  15 g  Refills:  0     spironolactone 50 mg Tablet  Commonly  known as:  ALDACTONE   50 mg, Oral, EVERY MORNING WITH BREAKFAST  Qty:  30 Tab  Refills:  0        CONTINUE these medications which have CHANGED during your visit.      Details   furosemide 20 mg Tablet  Commonly known as:  LASIX  What changed:     how much to take   how to take this   40 mg, Oral, DAILY  Qty:  60 Tab  Refills:  0     metoprolol succinate 50 mg Tablet Sustained Release 24 hr  Commonly known as:  TOPROL-XL  What changed:     how much to take   how to take this   25 mg, Oral, DAILY  Refills:  0        CONTINUE these medications - NO CHANGES were made during your visit.      Details   aspirin 81 mg Tablet, Delayed Release (E.C.)  Commonly known as:  ECOTRIN   81 mg, Oral  Refills:  0     atorvastatin 10 mg Tablet  Commonly known as:  LIPITOR   10 mg, Oral  Refills:  0     cetirizine 10 mg Tablet  Commonly known as:  ZYRTEC   10 mg, Oral  Refills:  0     citalopram 20 mg Tablet  Commonly known as:  CELEXA   20 mg, Oral  Refills:  0     glipiZIDE 10 mg Tablet Extended Rel 24 hr (2)  Commonly known as:  GLUCOTROL XL   10 mg, Oral  Refills:  0     HYDROcodone-acetaminophen 5-325 mg Tablet  Commonly known as:  NORCO   1 Tab, Oral, EVERY 4 HOURS PRN  Refills:  0     LORazepam 0.5 mg Tablet  Commonly known as:  ATIVAN   0.5 mg, DAILY  Refills:  0     losartan 100 mg Tablet  Commonly known as:  COZAAR   100 mg, Oral, DAILY  Refills:  0     MetFORMIN 1,000 mg Tablet  Commonly known as:  GLUCOPHAGE   1,000 mg, 2 TIMES DAILY WITH FOOD  Refills:  0     montelukast 10 mg Tablet  Commonly known as:  SINGULAIR   10 mg, Oral  Refills:  0     omeprazole 40 mg Capsule, Delayed Release(E.C.)  Commonly known as:  PRILOSEC   40 mg, Oral, DAILY  Qty:  90 Cap  Refills:  4        STOP taking these medications.    gabapentin 300 mg Capsule  Commonly known as:  NEURONTIN     hydroCHLOROthiazide 25 mg Tablet  Commonly known as:  HYDRODIURIL     lidocaine-prilocaine 2.5-2.5 % Cream  Commonly known as:  EMLA     potassium  chloride 20 mEq Tab Sust.Rel. Particle/Crystal  Commonly known as:  K-DUR     prochlorperazine 10 mg Tablet  Commonly known as:  COMPAZINE     promethazine 25 mg Tablet  Commonly known as:  PHENERGAN     ZOFRAN 8 mg Tablet  Generic drug:  ondansetron          Discharge med list refreshed?  YES        DISCHARGE INSTRUCTIONS:  Follow-up Information     Ignacia Felling, MD. Schedule an appointment as soon as possible for a visit in 1 week.    Specialty:  EXTERNAL  Contact information:  [place] [address]  [phone]             Go to [facility] - Emergency Department.    Specialty:  Emergency Medicine  Why:  If symptoms worsen  Contact information:  [place] [address]  [phone]  Additional information:  [place] on [address], take [address], turn right onto [address] then turn left onto [address] at the stop light. Visitor parking lot is adjacent to the front of the hospital.   [place] on [address], take [address], turn left onto [address] then turn left onto [address] at the second stop light. Visitor parking lot is adjacent  to the front of the hospital.           [place].    Specialty:  Gastroenterology  Contact information:  [address]  [address]  [place] [address]  [place] - DIABETIC DIET     Diet: DIABETIC DIET      DISCHARGE INSTRUCTION - ACTIVITY     Activity: AS TOLERATED      [place]     Follow-up in: 2 WEEKS    Reason for visit: [place]     Follow-up in: [place]    Reason for visit: HOSPITAL DISCHARGE    Follow-up reason: right upper quadrant ultrasound showing hepatomegaly and minimal changes consistent with cirrhosis      DME - WALKER Other (List in Comments) (Rollator woth  Seat), Seated Walker    Please note - If patient is 300 lbs or greater please order bariatric or heavy duty items.     Ht 152.4 cm    Wt 61.73 kg    Current Attending: Elenora Fender    Medical Condition or Diagnosis which is primary reason for equipment: Anasarca, Pancreatic Ca, DM, Asthma, S/P whipple, HTN, Cirrosis    Patient has mobility limits that significantly impairs ability to participate in one or more mobility related ADL's (MRADL's): Yes    [place] Limitations: Pt at heightened risk of injury r/t attempts to fulfill MRADL's & can safely use walker which resolves issue    Walker Type: Other (List in Comments) Rollator woth Seat   Walker Type: Seated Walker    Estimated Length of Need (in months; 36 mo.= lifetime) 41           [place] COURSE:  This is a 72 y.o., female with history of pancreatic cancer and pulmonary embolus.  Underwent Whipple procedure and had radiation and chemotherapy prior to coming to hospital.  Admitted into the hospital for anasarca.  Patient underwent successful diuresis and became hyponatremic.  Sodium levels have gradually improved and are stable.  Patient was deconditioned following hospital stay and transferred to [place] on 09/04/2018 for rehab.  Course in rehab was uneventful except for a few episodes of mild hypoglycemia which resolved with discontinuing sliding scale insulin.  Patient has completed rehab will be discharged home with home health.  Will need to follow up with Oncology as scheduled and PCP in 1 week.  Will also need to follow-up with GI for follow-up of right upper quad ultrasound showing hepatomegaly and minimal changes consistent with cirrhosis.    PHYSICAL EXAM: Nursing note and vitals reviewed.   Constitutional: Patient is in no acute distress.  Appears chronically ill.   Lungs: Clear to auscultation bilaterally without wheezes, rales or rhonchi.  Cardiovascular: Regular rate and rhythm.   Abdomen: Normal bowel sounds. Soft,  nontender, nondistended. No rebound, Guarding, or masses noted.   MSK/Extremities: Moves all extremities.   Skin: No lesions noted.  No rashes.  Neuro: CNs 2-12 grossly intact. No facial droop noted.    Psych: Alert andoriented to person, place, and time. Normal affect      CONDITION ON DISCHARGE: Improved and stable.      DISCHARGE DISPOSITION:  Home discharge and Home Health      Discharge time:  30 minutes        Jonah Blue, FNP-BC  09/25/2018, 09:15    Copies sent to Care Team       Relationship Specialty Notifications Start End    Ignacia Felling, MD (Inactive) PCP - General EXTERNAL  03/30/18     Phone: [phone] Fax: [phone]         [place] [address]          Referring providers can utilize [email or web address] access their referred [employer] patient's information.

## 2018-09-25 NOTE — Nurses Notes (Signed)
Discharge instructions, education, follow up appointments provided to patient and sister with understanding voiced. Right chest port de-accessed prior to discharge. No distress noted. Paperwork faxed and report called to [place] [facility], home health agency. Patient received DME rolator. Personal belongings sent with patient. Patient discharging via private car to home with family. Azucena Cecil, RN

## 2018-09-25 NOTE — OT Evaluation (Signed)
[facility]  Occupational Therapy Discharge Note    Patient Name: [name]  Date of Birth: 08/12/1946  Height:  152.4 cm (5')  Weight:  61.7 kg (136 lb 1.6 oz)  Room/Bed: 5121/A  Payor: MEDICARE / Plan: MEDICARE PART A AND B / Product Type: Medicare /     Date/Time of Admission: 09/04/2018  2:15 PM  Admitting Diagnosis:  Anasarca [R60.1]    D/C plan:   Patient to D/C to home today and [facility] services for further rehab.      Objective:   Precautions: fall     Activities of Daily Living (ADL's):        Grooming/Oral Hygiene:  I       Feeding: I        UB bathing:  SPV       UB dressing: SUA       LB bathing: SPV       LB dressing: MIN A       Toileting:  SPV  Instrumental Activities of Daily Living (IADL's): recommend assist with IADL tasks including laundry and cooking  Balance: Good with FWW   Functional Transfers: Use FWW at all times  Endurance:  Fatigues quickly with functional tasks  Therapeutic Exercise: as per daily tx note      Assessment:   Patient has made good progress towards all established OT goals.      Discharge Needs:   Equipment Recommendations: front wheeled walker  The patient presents with mobility limitations due to impaired strength and impaired functional activity tolerance that significantly impair/prevent patient's ability to participate in mobility-related activities of daily living (MRADLs) including  toileting, bathing, safely entering/exiting the home. This functional mobility deficit can be sufficiently resolved with the use of a  FWW in order to decrease the risk of falls, morbidity, and mortality in performance of these MRADLs.  Patient is able to safely use this assistive device.        Plan:   Discharge OT.       The risks/benefits of therapy have been discussed with the patient/caregiver and he/she is in agreement with the established plan of care.     Therapist:   Hubbard Robinson, OT,  09/25/2018    Time may include review of medical  chart, obtaining patient's functional history from patient/family/medical staff/case management/ancillary personnel, collaboration on findings and treatment options (with the above mentioned individuals), re-assessment, and acute care rehabilitation.

## 2018-09-25 NOTE — Discharge Instructions (Addendum)
[facility]  Discharge Notice    [name]  No discharge date for patient encounter.09/25/18  5121/A      TRANSPORTED BY:     [] Ambulance:               Phone #                         [x] Personal Car: Sister Phone # [phone]     [] Other:                                                                DESTINATION:  home                              EQUIPMENT NEEDED: VENDOR AND PHONE TO BE DELIVERED DATE & PLACE      [] O2          [] Wheelchair        [x] Walker   [facility]  Ph: [phone]  Fax: [phone]  Please deliver a Seated Rolator Walker to room 5121, [name] before d/c on Tuesday, 09/25/18    []  Potty Chair        [] Hosp Bed         []  Other                      AGENCY ASSISTANCE:    AGENCY & PHONE #     [] Meals on Wheels        [x] Home Health   [facility]  Ph: [phone]  Fax: [phone]  Pt, OT SN, symptom management  PCP: Bruce Greenburg  D/c Tuesday, 10 15 19     [] Long Term Care Facility        []  [facility]        []  Medicaid Waiver        [] Hospice        [] Other                NURSING PROCEDURES:        [] Doctor's Orders: Phys in to sign:            Called for Orders:                 [] Transfer form/Discharge Summary      [] RX's to resident/family      [] Personal belongings form signed by res./family      [] Home Health referral complete/transfer form copy attached      [] Copy of PO2 level for O2 Patients      [] Copy MDS + to LTC facility/hospice    [facility]  Notice of Transfer or Discharge    Resident Name/Responsible Party: [name]  Date: 09/24/2018    Due to the reason indicated below, discharge or transfer from [facility] will be necessary.    [x]  The transfer or discharge is appropriate because your health has improved sufficiently so you no longer need the services   provided by this facility.     [] The transfer or discharge is necessary for your welfare and the  resident's needs cannot be met in this facility.    [] The safety of other individuals in the facility are endangered.    [] The health of other individuals in the facility are endangered.     [] You have failed after reasonable and appropriate notice to pay for (or to have paid under Medicare) a stay at the facility.      Effective date of transfer: 09/25/18  Destination of transfer: home.    If you have any questions regarding this action, please contact our social worker at your earliest convenience.    YOU HAVE THE RIGHT TO APPEAL THIS ACTION TO:     Erika Young- Board of Review  [employer]- Office of the Inspector General  [facility]  [address]  [place], [place] [address]  [phone]  [email or web address]    YOU HAVE THE RIGHT TO VOICE CONCERNS TO :    [place] Department of Health              Disability Rights of [place]  Office of Health Facilities              [address], [address]  Licensure & Certification               [facility]  [address]                  [place], [place] [address]  [place] [address]           [phone] or [phone]  [phone]                                  [email or web address]  [email or web address]      State Long Term Care Ombudsman  Suzanne Messenger, Esq  [address], [address]  [place], [place] [address]  [phone]  [email or web address]      The name of those filing complaints will be kept confidential.    Notified by: Angie Michaels                   Date of Verbal notification: 09/24/18                                                               Date of Written notification: 09/25/18

## 2018-09-25 NOTE — Telephone Encounter (Signed)
Requests declined. Not a rheum patient or rheum medications.  Alen Blew, LPN

## 2018-10-20 ENCOUNTER — Other Ambulatory Visit (INDEPENDENT_AMBULATORY_CARE_PROVIDER_SITE_OTHER): Payer: Self-pay | Admitting: Family

## 2018-10-20 DIAGNOSIS — C259 Malignant neoplasm of pancreas, unspecified: Secondary | ICD-10-CM

## 2018-10-22 ENCOUNTER — Inpatient Hospital Stay
Admission: EM | Admit: 2018-10-22 | Discharge: 2018-10-29 | DRG: 638 | Disposition: A | Discharge status: Home / Self Care | Payer: Medicare Other | Attending: Family Medicine | Admitting: Family Medicine

## 2018-10-22 ENCOUNTER — Encounter (HOSPITAL_COMMUNITY): Payer: Self-pay

## 2018-10-22 DIAGNOSIS — R519 Headache, unspecified: Secondary | ICD-10-CM | POA: Diagnosis not present

## 2018-10-22 DIAGNOSIS — T383X5A Adverse effect of insulin and oral hypoglycemic [antidiabetic] drugs, initial encounter: Secondary | ICD-10-CM | POA: Diagnosis present

## 2018-10-22 DIAGNOSIS — E11649 Type 2 diabetes mellitus with hypoglycemia without coma: Principal | ICD-10-CM | POA: Diagnosis present

## 2018-10-22 DIAGNOSIS — Z7901 Long term (current) use of anticoagulants: Secondary | ICD-10-CM

## 2018-10-22 DIAGNOSIS — I4891 Unspecified atrial fibrillation: Secondary | ICD-10-CM | POA: Diagnosis present

## 2018-10-22 DIAGNOSIS — F410 Panic disorder [episodic paroxysmal anxiety] without agoraphobia: Secondary | ICD-10-CM | POA: Diagnosis present

## 2018-10-22 DIAGNOSIS — Z7982 Long term (current) use of aspirin: Secondary | ICD-10-CM

## 2018-10-22 DIAGNOSIS — T383X1A Poisoning by insulin and oral hypoglycemic [antidiabetic] drugs, accidental (unintentional), initial encounter: Secondary | ICD-10-CM

## 2018-10-22 DIAGNOSIS — E875 Hyperkalemia: Secondary | ICD-10-CM | POA: Diagnosis present

## 2018-10-22 DIAGNOSIS — F329 Major depressive disorder, single episode, unspecified: Secondary | ICD-10-CM | POA: Diagnosis present

## 2018-10-22 DIAGNOSIS — E7800 Pure hypercholesterolemia, unspecified: Secondary | ICD-10-CM | POA: Diagnosis present

## 2018-10-22 DIAGNOSIS — Z87891 Personal history of nicotine dependence: Secondary | ICD-10-CM | POA: Insufficient documentation

## 2018-10-22 DIAGNOSIS — Z79899 Other long term (current) drug therapy: Secondary | ICD-10-CM

## 2018-10-22 DIAGNOSIS — R410 Disorientation, unspecified: Secondary | ICD-10-CM | POA: Insufficient documentation

## 2018-10-22 DIAGNOSIS — Z7984 Long term (current) use of oral hypoglycemic drugs: Secondary | ICD-10-CM | POA: Insufficient documentation

## 2018-10-22 DIAGNOSIS — Z79891 Long term (current) use of opiate analgesic: Secondary | ICD-10-CM

## 2018-10-22 DIAGNOSIS — E222 Syndrome of inappropriate secretion of antidiuretic hormone: Secondary | ICD-10-CM | POA: Diagnosis present

## 2018-10-22 DIAGNOSIS — Z794 Long term (current) use of insulin: Secondary | ICD-10-CM

## 2018-10-22 DIAGNOSIS — K219 Gastro-esophageal reflux disease without esophagitis: Secondary | ICD-10-CM | POA: Diagnosis present

## 2018-10-22 DIAGNOSIS — Z86711 Personal history of pulmonary embolism: Secondary | ICD-10-CM

## 2018-10-22 DIAGNOSIS — I509 Heart failure, unspecified: Secondary | ICD-10-CM | POA: Diagnosis present

## 2018-10-22 DIAGNOSIS — E119 Type 2 diabetes mellitus without complications: Secondary | ICD-10-CM | POA: Diagnosis present

## 2018-10-22 DIAGNOSIS — I10 Essential (primary) hypertension: Secondary | ICD-10-CM | POA: Insufficient documentation

## 2018-10-22 DIAGNOSIS — E1142 Type 2 diabetes mellitus with diabetic polyneuropathy: Secondary | ICD-10-CM | POA: Diagnosis present

## 2018-10-22 DIAGNOSIS — E785 Hyperlipidemia, unspecified: Secondary | ICD-10-CM | POA: Diagnosis present

## 2018-10-22 DIAGNOSIS — I110 Hypertensive heart disease with heart failure: Secondary | ICD-10-CM | POA: Diagnosis present

## 2018-10-22 DIAGNOSIS — Z923 Personal history of irradiation: Secondary | ICD-10-CM

## 2018-10-22 DIAGNOSIS — E871 Hypo-osmolality and hyponatremia: Secondary | ICD-10-CM | POA: Diagnosis present

## 2018-10-22 DIAGNOSIS — J449 Chronic obstructive pulmonary disease, unspecified: Secondary | ICD-10-CM | POA: Diagnosis present

## 2018-10-22 DIAGNOSIS — R51 Headache: Secondary | ICD-10-CM | POA: Insufficient documentation

## 2018-10-22 DIAGNOSIS — Z8507 Personal history of malignant neoplasm of pancreas: Secondary | ICD-10-CM

## 2018-10-22 DIAGNOSIS — Z90411 Acquired partial absence of pancreas: Secondary | ICD-10-CM

## 2018-10-22 DIAGNOSIS — G4730 Sleep apnea, unspecified: Secondary | ICD-10-CM | POA: Diagnosis present

## 2018-10-22 DIAGNOSIS — E160 Drug-induced hypoglycemia without coma: Secondary | ICD-10-CM

## 2018-10-22 LAB — CBC WITH DIFF
BASOPHIL #: <0.1 10*3/uL (ref <=0.20)
BASOPHIL %: 1 %
EOSINOPHIL #: <0.1 10*3/uL (ref <=0.50)
EOSINOPHIL %: 1 %
HCT: 35.7 % (ref 34.8–46.0)
HGB: 11.9 g/dL (ref 11.5–16.0)
IMMATURE GRANULOCYTE #: <0.1 10*3/uL (ref <0.10)
IMMATURE GRANULOCYTE %: 1 % (ref 0–1)
LYMPHOCYTE #: 0.56 10*3/uL — ABNORMAL LOW (ref 1.00–4.80)
LYMPHOCYTE %: 17 %
MCH: 30.7 pg (ref 26.0–32.0)
MCHC: 33.3 g/dL (ref 31.0–35.5)
MCV: 92 fL (ref 78.0–100.0)
MONOCYTE #: 0.22 10*3/uL (ref 0.20–1.10)
MONOCYTE %: 7 %
MPV: 10.8 fL (ref 8.7–12.5)
NEUTROPHIL #: 2.4 10*3/uL (ref 1.50–7.70)
NEUTROPHIL %: 73 %
PLATELETS: 123 10*3/uL — ABNORMAL LOW (ref 150–400)
RBC: 3.88 10*6/uL (ref 3.85–5.22)
RDW-CV: 13.8 % (ref 11.5–15.5)
WBC: 3.3 10*3/uL — ABNORMAL LOW (ref 3.7–11.0)

## 2018-10-22 LAB — BASIC METABOLIC PANEL
ANION GAP: 9 mmol/L (ref 8–16)
BUN/CREA RATIO: 31
BUN: 34 mg/dL — ABNORMAL HIGH (ref 7–17)
CALCIUM: 9.1 mg/dL (ref 8.4–10.2)
CHLORIDE: 99 mmol/L (ref 98–107)
CO2 TOTAL: 20 mmol/L — ABNORMAL LOW (ref 22–30)
CREATININE: 1.1 mg/dL — ABNORMAL HIGH (ref 0.52–1.04)
ESTIMATED GFR: 51 mL/min/{1.73_m2} — ABNORMAL LOW (ref >60)
GLUCOSE: 50 mg/dL — ABNORMAL LOW (ref 74–106)
POTASSIUM: 5.7 mmol/L — ABNORMAL HIGH (ref 3.5–5.1)
SODIUM: 128 mmol/L — ABNORMAL LOW (ref 137–145)

## 2018-10-22 LAB — MORPHOLOGY: RBC MORPHOLOGY: NORMAL

## 2018-10-22 LAB — GRAY TOP TUBE

## 2018-10-22 LAB — LIGHT GREEN TOP TUBE

## 2018-10-22 LAB — BLUE TOP TUBE

## 2018-10-22 MED ORDER — LORATADINE 10 MG TABLET
10.0000 mg | ORAL_TABLET | Freq: Every day | ORAL | Status: DC
Start: 2018-10-22 — End: 2018-10-30
  Administered 2018-10-22 – 2018-10-29 (×9): 10 mg via ORAL
  Filled 2018-10-22 (×8): qty 1

## 2018-10-22 MED ORDER — SPIRONOLACTONE 25 MG TABLET
50.00 mg | ORAL_TABLET | Freq: Every morning | ORAL | Status: DC
Start: 2018-10-23 — End: 2018-10-24
  Administered 2018-10-24: 0 mg via ORAL
  Filled 2018-10-22 (×2): qty 2

## 2018-10-22 MED ORDER — FUROSEMIDE 40 MG TABLET
40.0000 mg | ORAL_TABLET | Freq: Every day | ORAL | Status: DC
Start: 2018-10-22 — End: 2018-10-25
  Administered 2018-10-22 – 2018-10-25 (×4): 40 mg via ORAL
  Filled 2018-10-22 (×4): qty 1

## 2018-10-22 MED ORDER — SODIUM CHLORIDE 0.9 % (FLUSH) INJECTION SYRINGE
10.0000 mL | INJECTION | Freq: Three times a day (TID) | INTRAMUSCULAR | Status: DC
Start: 2018-10-22 — End: 2018-10-30
  Administered 2018-10-22 – 2018-10-24 (×6): 10 mL
  Administered 2018-10-24: 0 mL
  Administered 2018-10-24 (×2): 10 mL
  Administered 2018-10-25 (×2): 0 mL
  Administered 2018-10-25 – 2018-10-26 (×3): 10 mL
  Administered 2018-10-26: 0 mL
  Administered 2018-10-27 – 2018-10-28 (×5): 10 mL
  Administered 2018-10-28: 0 mL
  Administered 2018-10-29: 10 mL
  Administered 2018-10-29: 0 mL
  Administered 2018-10-29: 10 mL

## 2018-10-22 MED ORDER — ONDANSETRON HCL 4 MG TABLET
4.00 mg | ORAL_TABLET | Freq: Four times a day (QID) | ORAL | Status: DC | PRN
Start: 2018-10-22 — End: 2018-10-22

## 2018-10-22 MED ORDER — LOSARTAN 50 MG TABLET
100.0000 mg | ORAL_TABLET | Freq: Every day | ORAL | Status: DC
Start: 2018-10-22 — End: 2018-10-23
  Administered 2018-10-22: 100 mg via ORAL
  Filled 2018-10-22: qty 2

## 2018-10-22 MED ORDER — PANTOPRAZOLE 40 MG TABLET,DELAYED RELEASE
40.00 mg | DELAYED_RELEASE_TABLET | Freq: Every day | ORAL | Status: DC
Start: 2018-10-22 — End: 2018-10-30
  Administered 2018-10-22 – 2018-10-29 (×8): 40 mg via ORAL
  Filled 2018-10-22 (×8): qty 1

## 2018-10-22 MED ORDER — HYDROCODONE 5 MG-ACETAMINOPHEN 325 MG TABLET
1.00 | ORAL_TABLET | ORAL | Status: DC | PRN
Start: 2018-10-22 — End: 2018-10-30
  Administered 2018-10-25 – 2018-10-28 (×2): 1 via ORAL
  Filled 2018-10-22 (×2): qty 1

## 2018-10-22 MED ORDER — SODIUM CHLORIDE 0.9 % (FLUSH) INJECTION SYRINGE
10.0000 mL | INJECTION | INTRAMUSCULAR | Status: DC | PRN
Start: 2018-10-22 — End: 2018-10-30

## 2018-10-22 MED ORDER — ATORVASTATIN 20 MG TABLET
10.00 mg | ORAL_TABLET | Freq: Every evening | ORAL | Status: DC
Start: 2018-10-22 — End: 2018-10-30
  Administered 2018-10-22 – 2018-10-24 (×3): 20 mg via ORAL
  Administered 2018-10-25 – 2018-10-28 (×4): 10 mg via ORAL
  Administered 2018-10-29: 20 mg via ORAL
  Filled 2018-10-22 (×8): qty 1

## 2018-10-22 MED ORDER — MONTELUKAST 10 MG TABLET
10.00 mg | ORAL_TABLET | Freq: Every evening | ORAL | Status: DC
Start: 2018-10-22 — End: 2018-10-30
  Administered 2018-10-22 – 2018-10-29 (×8): 10 mg via ORAL
  Filled 2018-10-22 (×8): qty 1

## 2018-10-22 MED ORDER — NYSTATIN 100,000 UNIT/GRAM TOPICAL POWDER
1.00 | Freq: Three times a day (TID) | CUTANEOUS | Status: DC
Start: 2018-10-22 — End: 2018-10-30
  Administered 2018-10-22: 0 via TOPICAL
  Administered 2018-10-23 – 2018-10-28 (×17): 1 via TOPICAL
  Administered 2018-10-28 – 2018-10-29 (×3): 0 via TOPICAL
  Administered 2018-10-29: 1 via TOPICAL
  Filled 2018-10-22 (×3): qty 15

## 2018-10-22 MED ORDER — APIXABAN 5 MG TABLET
2.50 mg | ORAL_TABLET | Freq: Two times a day (BID) | ORAL | Status: DC
Start: 2018-10-22 — End: 2018-10-30
  Administered 2018-10-22: 5 mg via ORAL
  Administered 2018-10-23: 2.5 mg via ORAL
  Administered 2018-10-23: 5 mg via ORAL
  Administered 2018-10-24: 2.5 mg via ORAL
  Administered 2018-10-24: 5 mg via ORAL
  Administered 2018-10-25 – 2018-10-29 (×9): 2.5 mg via ORAL
  Administered 2018-10-29: 5 mg via ORAL
  Filled 2018-10-22 (×16): qty 1

## 2018-10-22 MED ORDER — HYDROCORTISONE 1 %-PRAMOXINE 1 % RECTAL FOAM
1.00 | Freq: Two times a day (BID) | RECTAL | Status: DC | PRN
Start: 2018-10-22 — End: 2018-10-30

## 2018-10-22 MED ORDER — LIPASE-PROTEASE-AMYLASE 12,000-38,000-60,000 UNIT CAPSULE,DELAYED REL
2.00 | DELAYED_RELEASE_CAPSULE | Freq: Three times a day (TID) | ORAL | Status: DC
Start: 2018-10-22 — End: 2018-10-30
  Administered 2018-10-22 – 2018-10-29 (×22): 0 via ORAL

## 2018-10-22 MED ORDER — SODIUM CHLORIDE 0.9 % IV BOLUS
500.00 mL | INJECTION | Status: AC
Start: 2018-10-22 — End: 2018-10-22
  Administered 2018-10-22: 500 mL via INTRAVENOUS
  Administered 2018-10-22: 0 mL via INTRAVENOUS

## 2018-10-22 MED ORDER — METFORMIN 500 MG TABLET
1000.00 mg | ORAL_TABLET | Freq: Two times a day (BID) | ORAL | Status: DC
Start: 2018-10-22 — End: 2018-10-22
  Administered 2018-10-22: 1000 mg via ORAL
  Filled 2018-10-22: qty 2

## 2018-10-22 MED ORDER — CITALOPRAM 20 MG TABLET
20.0000 mg | ORAL_TABLET | Freq: Every day | ORAL | Status: DC
Start: 2018-10-22 — End: 2018-10-25
  Administered 2018-10-22 – 2018-10-25 (×4): 20 mg via ORAL
  Filled 2018-10-22 (×4): qty 1

## 2018-10-22 MED ORDER — DEXTROSE 50 % IN WATER (D50W) INTRAVENOUS SYRINGE
25.00 g | INJECTION | INTRAVENOUS | Status: AC
Start: 2018-10-22 — End: 2018-10-22
  Administered 2018-10-22: 50 mL via INTRAVENOUS

## 2018-10-22 MED ORDER — HYDRALAZINE 10 MG TABLET
10.00 mg | ORAL_TABLET | Freq: Two times a day (BID) | ORAL | Status: DC
Start: 2018-10-22 — End: 2018-10-30
  Administered 2018-10-22: 10 mg via ORAL
  Administered 2018-10-23: 0 mg via ORAL
  Administered 2018-10-23 – 2018-10-25 (×4): 10 mg via ORAL
  Administered 2018-10-25: 0 mg via ORAL
  Administered 2018-10-26 – 2018-10-29 (×8): 10 mg via ORAL
  Filled 2018-10-22 (×13): qty 1

## 2018-10-22 MED ORDER — METOPROLOL SUCCINATE ER 25 MG TABLET,EXTENDED RELEASE 24 HR
25.0000 mg | ORAL_TABLET | Freq: Every day | ORAL | Status: DC
Start: 2018-10-22 — End: 2018-10-30
  Administered 2018-10-22 – 2018-10-29 (×8): 25 mg via ORAL
  Filled 2018-10-22 (×8): qty 1

## 2018-10-22 MED ORDER — ASPIRIN 81 MG TABLET,DELAYED RELEASE
81.0000 mg | DELAYED_RELEASE_TABLET | Freq: Every day | ORAL | Status: DC
Start: 2018-10-22 — End: 2018-10-30
  Administered 2018-10-22 – 2018-10-29 (×8): 81 mg via ORAL
  Filled 2018-10-22 (×8): qty 1

## 2018-10-22 MED ORDER — ONDANSETRON 4 MG DISINTEGRATING TABLET
4.00 mg | ORAL_TABLET | Freq: Four times a day (QID) | ORAL | Status: DC | PRN
Start: 2018-10-22 — End: 2018-10-30

## 2018-10-22 MED ORDER — MAGNESIUM HYDROXIDE 400 MG/5 ML ORAL SUSPENSION
15.0000 mL | Freq: Every day | ORAL | Status: DC | PRN
Start: 2018-10-22 — End: 2018-10-30

## 2018-10-22 MED ORDER — ONDANSETRON HCL (PF) 4 MG/2 ML INJECTION SOLUTION
4.0000 mg | Freq: Four times a day (QID) | INTRAMUSCULAR | Status: DC | PRN
Start: 2018-10-22 — End: 2018-10-30
  Administered 2018-10-25: 4 mg via INTRAVENOUS
  Filled 2018-10-22: qty 2

## 2018-10-22 MED ORDER — LORAZEPAM 0.5 MG TABLET
0.5000 mg | ORAL_TABLET | Freq: Every day | ORAL | Status: DC
Start: 2018-10-22 — End: 2018-10-30
  Administered 2018-10-22 – 2018-10-29 (×8): 0.5 mg via ORAL
  Filled 2018-10-22 (×8): qty 1

## 2018-10-22 MED ADMIN — dextrose 50 % in water (D50W) intravenous syringe: INTRAVENOUS | @ 11:00:00

## 2018-10-22 MED ADMIN — sodium chloride 0.9 % (flush) injection syringe: ORAL | @ 18:00:00

## 2018-10-22 NOTE — Nurses Notes (Signed)
Received patient from the ED via carrier. Patient transferred to bed without difficulty.  Patient and family oriented to surroundings, call light and bed function.  Instructed to call when needing any assistance or experiencing any pain or drop in physical condition

## 2018-10-22 NOTE — ED Triage Notes (Signed)
LOW BLOOD SUGAR. ACCUCHECK 30 AT HOME.  PATIENT GIVEN 15MG  ORAL GLUCOSE PER EMS.  LAST ACCUCHECK BY EMS 46.

## 2018-10-22 NOTE — Telephone Encounter (Signed)
Request declined. Not a rheum patient or a rheum medication.  Zacarias Krauter M Tru Rana, LPN

## 2018-10-22 NOTE — ED Nurses Note (Signed)
Report to Mertens RN via SBAR, Preparing to transfer pt to MedSurg bed

## 2018-10-22 NOTE — H&P (Signed)
[facility]  Family Medicine  History & Physical    Date of Service:  10/22/2018  [name], [name], 72 y.o. female  Date of Admission:  10/22/2018  Date of Birth:  May 07, 1946  PCP: Ignacia Felling, MD  Code Status:   Code Status Information     Code Status    No CPR           Chief Complaint:  Hypoglycemia    HPI:  [name] is a 72 y.o. White female who is admitted for asymptomatic hypoglycemia patient was at home this morning and had not eaten breakfast yet and developed arm decreased mental function and family tested her and found her to have a sugar 30 on.  They called for help and help came and was able to give her some D50 in and and and route her and presentation the emergency room she had a sugar of 60 and in and they again gave her D50 and in her sugars have remained well and she recovered her mental status on her sugars came up.  She is on metformin and glipizide for diabetes she has had a recent on last couple years she had a Whipple for pancreatic cancer she has remained cancer-free which she has had multiple medical problems and did not tolerate chemo and is no longer taking chemo on.  On the patient was in the process of having her diabetic regimen and found decreased because she has been losing some weight and her sugars have been coming down on.  I am going to admit her to observation and hold her glipizide and metformin and follow her fingersticks and if she is stable she should be able to go home in the morning time.    Past Medical History:   Diagnosis Date   . Abdominal hernia 10/18/2017    hx of repair   . Anxiety    . Arthritis    . Asthma    . Atrial fibrillation (CMS HCC)    . Back problem    . Bruises easily    . Cancer (CMS [place]) 10/18/2017    chemo and radiation completed 09/24/2017   . COPD (chronic obstructive pulmonary disease) (CMS HCC)    . CPAP (continuous positive airway pressure) dependence 10/18/2017    has not used recently   . Depression    . DM  (diabetes mellitus) (CMS HCC) 2000    Fasting BG 200's. HGA1C 2018 6   . Edema    . Essential hypertension    . GERD (gastroesophageal reflux disease)    . Headache    . Heartburn    . Hx antineoplastic chemo 2018   . Hx of radiation therapy 2018   . Hypercholesteremia    . Hyperlipidemia    . Migraine 10/18/2017    none recently   . Obesity    . Palpitations    . Pancreatic cancer (CMS [place]) 03/2017   . Panic attack    . PE (pulmonary thromboembolism) (CMS HCC) 03/29/2018   . Peripheral neuropathy 10/18/2017    knees down, bilateral   . Sleep apnea    . Type 2 diabetes mellitus (CMS HCC) 10/18/2017    Dx 2000 FBS 200s   . Unintentional weight loss 10/18/2017    25 lbs 03/2017   . Wears glasses       Past Surgical History:   Procedure Laterality Date   . CESAREAN SECTION     . HX HERNIA REPAIR     .  HX SUBCLAVIAN PORT IMPLANTION      s/p removed   . HX SUBCLAVIAN PORT IMPLANTION     . HX TONSIL AND ADENOIDECTOMY     . HX TONSILLECTOMY     . UMBILICAL HERNIA REPAIR      x2      Social History     Tobacco Use   . Smoking status: Former Smoker     Packs/day: 0.50     Years: 20.00     Pack years: 10.00     Last attempt to quit: 10/18/1993     Years since quitting: 25.0   . Smokeless tobacco: Never Used   Substance Use Topics   . Alcohol use: No   . Drug use: No       Family Medical History:     Problem Relation (Age of Onset)    Diabetes Mother    Heart Disease Mother         Medications Prior to Admission     Prescriptions    apixaban (ELIQUIS) 2.5 mg Oral Tablet    Take 1 Tab (2.5 mg total) by mouth Twice daily    aspirin (ECOTRIN) 81 mg Oral Tablet, Delayed Release (E.C.)    Take 81 mg by mouth    atorvastatin (LIPITOR) 10 mg Oral Tablet    Take 10 mg by mouth    cetirizine (ZYRTEC) 10 mg Oral Tablet    Take 10 mg by mouth    citalopram (CELEXA) 20 mg Oral Tablet    Take 20 mg by mouth    furosemide (LASIX) 20 mg Oral Tablet    Take 2 Tabs (40 mg total) by mouth Once a day    hydrALAZINE (APRESOLINE) 10 mg Oral Tablet     Take 1 Tab (10 mg total) by mouth Every 8 hours    Patient taking differently:  Take 10 mg by mouth Twice daily     HYDROcodone-acetaminophen (NORCO) 5-325 mg Oral Tablet    Take 1 Tab by mouth Every 4 hours as needed for Pain    hydrocortisone-pramoxine (PROCTOFOAM HC) 1-1 % Rectal Foam    1 Applicator by Rectal route Every 6 hours    Patient taking differently:  1 Applicator by Rectal route Twice per day as needed     LORazepam (ATIVAN) 0.5 mg Oral Tablet    0.5 mg Once a day     losartan (COZAAR) 100 mg Oral Tablet    Take 100 mg by mouth Once a day     MetFORMIN (GLUCOPHAGE) 1,000 mg Oral Tablet    1,000 mg Twice daily with food     metoprolol succinate (TOPROL-XL) 50 mg Oral Tablet Sustained Release 24 hr    Take 0.5 Tabs (25 mg total) by mouth Once a day    montelukast (SINGULAIR) 10 mg Oral Tablet    Take 10 mg by mouth    nystatin (NYSTOP) 100,000 unit/gram Powder    1 Applicator by Apply Topically route Three times a day    omeprazole (PRILOSEC) 40 mg Oral Capsule, Delayed Release(E.C.)    Take 1 Cap (40 mg total) by mouth Once a day    ondansetron (ZOFRAN) 4 mg Oral Tablet    Take 4 mg by mouth Every 6 hours as needed for nausea/vomiting    pancreatic enzyme replacement (CREON) 12,000-38,000 -60,000 unit Oral Capsule, Delayed Release(E.C.)    Take by mouth Three times daily with meals 2 CAPS WITH MEALS AND 1 CAP WITH [facility]  spironolactone (ALDACTONE) 50 mg Oral Tablet    Take 1 Tab (50 mg total) by mouth Every morning with breakfast         Allergies   Allergen Reactions   . Sulfa (Sulfonamides) Itching        ROS - all systems reviewed and negative except as per HPI     Filed Vitals:    10/22/18 1530 10/22/18 1630 10/22/18 1657 10/22/18 1956   BP: (!) 136/53 140/64 (!) 152/55 (!) 140/50   Pulse: 76 81 82 92   Resp: 19 17 18 20    Temp:   36.5 C (97.7 F) 36.7 C (98 F)   SpO2: 99% 100% 99%        Physical Exam   Constitutional: She is oriented to person, place, and time.   Pale, chronically ill  appearing in no acute distress   HENT:   Head: Normocephalic and atraumatic.   Right Ear: External ear normal.   Left Ear: External ear normal.   Mouth/Throat: Oropharynx is clear and moist.   Eyes: Pupils are equal, round, and reactive to light. EOM are normal.   Neck: No tracheal deviation present. No thyromegaly present.   Cardiovascular: Normal rate, regular rhythm and normal heart sounds.   No murmur heard.  Pulmonary/Chest: Effort normal and breath sounds normal.   Abdominal: Soft. She exhibits no mass. There is no tenderness. There is no guarding.   Musculoskeletal: She exhibits no edema, tenderness or deformity.   Lymphadenopathy:     She has no cervical adenopathy.   Neurological: She is alert and oriented to person, place, and time. No cranial nerve deficit. Gait normal.   Skin: Skin is warm and dry. No rash noted.   Psychiatric: Affect and judgment normal.       Laboratory Data:     Results for orders placed or performed during the hospital encounter of 10/22/18 (from the past 72 hour(s))   BASIC METABOLIC PANEL   Result Value Ref Range    SODIUM 128 (L) 137 - 145 mmol/L    POTASSIUM 5.7 (H) 3.5 - 5.1 mmol/L    CHLORIDE 99 98 - 107 mmol/L    CO2 TOTAL 20 (L) 22 - 30 mmol/L    ANION GAP 9 8 - 16 mmol/L    CALCIUM 9.1 8.4 - 10.2 mg/dL    GLUCOSE 50 (L) 74 - 106 mg/dL    BUN 34 (H) 7 - 17 mg/dL    CREATININE 1.10 (H) 0.52 - 1.04 mg/dL    BUN/CREA RATIO 31     ESTIMATED GFR 51 (L) >60 mL/min/1.27m^2   CBC WITH DIFF   Result Value Ref Range    WBC 3.3 (L) 3.7 - 11.0 x10^3/uL    RBC 3.88 3.85 - 5.22 x10^6/uL    HGB 11.9 11.5 - 16.0 g/dL    HCT 35.7 34.8 - 46.0 %    MCV 92.0 78.0 - 100.0 fL    MCH 30.7 26.0 - 32.0 pg    MCHC 33.3 31.0 - 35.5 g/dL    RDW-CV 13.8 11.5 - 15.5 %    PLATELETS 123 (L) 150 - 400 x10^3/uL    MPV 10.8 8.7 - 12.5 fL    NEUTROPHIL % 73 %    LYMPHOCYTE % 17 %    MONOCYTE % 7 %    EOSINOPHIL % 1 %    BASOPHIL % 1 %    NEUTROPHIL # 2.40 1.50 - 7.70 x10^3/uL    LYMPHOCYTE #  0.56 (L) 1.00 -  4.80 x10^3/uL    MONOCYTE # 0.22 0.20 - 1.10 x10^3/uL    EOSINOPHIL # <0.10 <=0.50 x10^3/uL    BASOPHIL # <0.10 <=0.20 x10^3/uL    IMMATURE GRANULOCYTE % 1 0 - 1 %    IMMATURE GRANULOCYTE # <0.10 <0.10 x10^3/uL   MORPHOLOGY   Result Value Ref Range    RBC MORPHOLOGY Normal RBC and PLT Morphology    BLUE TOP TUBE   Result Value Ref Range    RAINBOW/EXTRA TUBE AUTO RESULT Yes    LIGHT GREEN TOP TUBE   Result Value Ref Range    RAINBOW/EXTRA TUBE AUTO RESULT Yes    GRAY TOP TUBE   Result Value Ref Range    RAINBOW/EXTRA TUBE AUTO RESULT Yes        Imaging Studies:    No orders to display       Assessment/Plan:  Active Hospital Problems    Diagnosis   . Primary Problem: Hypoglycemia associated with type 2 diabetes mellitus (CMS [place])   . Hyperkalemia   . Type 2 diabetes mellitus (CMS HCC)       Admit to observation  DC metformin and glipizide  DC spironolactone  Follow accuchecks  Recheck BMP in am   Home tomorrow if glucose remains stable    Ignacia Felling, MD

## 2018-10-22 NOTE — ED Nurses Note (Signed)
Family at bedside- lunch tray ordered for pt

## 2018-10-22 NOTE — Care Plan (Signed)
Problem: Adult Inpatient Plan of Care  Goal: Plan of Care Review  Outcome: Ongoing (see interventions/notes)  Flowsheets (Taken 10/22/2018 1758)  Plan of Care Reviewed With: patient; sibling  Progress: improving  Goal: Patient-Specific Goal (Individualization)  Outcome: Ongoing (see interventions/notes)  Flowsheets (Taken 10/22/2018 1700 by Randolm Idol, RN)  Individualized Care Needs: WNL BS  Anxieties, Fears or Concerns: none  Patient-Specific Goals (Include Timeframe): WNL BS  Goal: Absence of Hospital-Acquired Illness or Injury  Outcome: Ongoing (see interventions/notes)  Goal: Optimal Comfort and Wellbeing  Outcome: Ongoing (see interventions/notes)  Goal: Rounds/Family Conference  Outcome: Ongoing (see interventions/notes)     Problem: Fall Injury Risk  Goal: Absence of Fall and Fall-Related Injury  Outcome: Ongoing (see interventions/notes)     Problem: Glycemic Control Impaired  Goal: Blood Glucose Level Within Desired Range  Outcome: Ongoing (see interventions/notes)    Blood Sugar 215 mg/dL. BS increasing. Patient alert and oriented. VSS. Fall prevention monitoring ongoing.  Patient denies pain at present.

## 2018-10-22 NOTE — ED Nurses Note (Signed)
Pt to be admitted; awaiting orders from admitting provider

## 2018-10-22 NOTE — ED Nurses Note (Signed)
LUNCH TRAY PROVIDED. VISITORS AT BEDSIDE.

## 2018-10-22 NOTE — ED Nurses Note (Signed)
ORDERS COMPLETED, PT RESTING- ON MONITOR WITH CALL LIGHT IN REACH

## 2018-10-22 NOTE — ED Provider Notes (Signed)
[place]  [birth year]  72 y.o.  female  [name] [id number]   [phone] (home)    Chief Complaint:   Chief Complaint   Patient presents with   . Hypoglycemia       HPI: This is a 72 y.o. female who presents to the emergency department via EMS with complaint hypoglycemia.  Patient has history of pancreatic cancer, and was noted to be confused this morning.  Reported Accu-Chek was 30 at home, dropping to 28 mg/dL.  Some juice was given, and patient was brought to the ER for further evaluation.  Patient is not on insulin, and had not taken her medications today, however had not eaten as well.                Past Medical History:   Past Medical History:   Diagnosis Date   . Abdominal hernia 10/18/2017    hx of repair   . Anxiety    . Arthritis    . Asthma    . Atrial fibrillation (CMS HCC)    . Back problem    . Bruises easily    . Cancer (CMS [place]) 10/18/2017    chemo and radiation completed 09/24/2017   . COPD (chronic obstructive pulmonary disease) (CMS HCC)    . CPAP (continuous positive airway pressure) dependence 10/18/2017    has not used recently   . Depression    . DM (diabetes mellitus) (CMS HCC) 2000    Fasting BG 200's. HGA1C 2018 6   . Edema    . Essential hypertension    . GERD (gastroesophageal reflux disease)    . Headache    . Heartburn    . Hx antineoplastic chemo 2018   . Hx of radiation therapy 2018   . Hypercholesteremia    . Hyperlipidemia    . Migraine 10/18/2017    none recently   . Obesity    . Palpitations    . Pancreatic cancer (CMS [place]) 03/2017   . Panic attack    . PE (pulmonary thromboembolism) (CMS HCC) 03/29/2018   . Peripheral neuropathy 10/18/2017    knees down, bilateral   . Sleep apnea    . Type 2 diabetes mellitus (CMS HCC) 10/18/2017    Dx 2000 FBS 200s   . Unintentional weight loss 10/18/2017    25 lbs 03/2017   . Wears glasses        (Not in an outpatient encounter)   Past Surgical History:   Past Surgical History:   Procedure Laterality Date   . Cesarean section     . Hx hernia  repair     . Hx subclavian port implantion     . Hx subclavian port implantion     . Hx tonsil and adenoidectomy     . Hx tonsillectomy     . Umbilical hernia repair         Social History:   Social History     Tobacco Use   . Smoking status: Former Smoker     Packs/day: 0.50     Years: 20.00     Pack years: 10.00     Last attempt to quit: 10/18/1993     Years since quitting: 25.0   . Smokeless tobacco: Never Used   Substance Use Topics   . Alcohol use: No   . Drug use: No        Family History:  Family History   Problem Relation Age of Onset   .  Heart Disease Mother    . Diabetes Mother      Prior to Admission medications    Medication Sig Start Date End Date Taking? Authorizing Provider   apixaban (ELIQUIS) 2.5 mg Oral Tablet Take 1 Tab (2.5 mg total) by mouth Twice daily 09/25/18   Jonah Blue, FNP-BC   aspirin (ECOTRIN) 81 mg Oral Tablet, Delayed Release (E.C.) Take 81 mg by mouth    Provider, Historical   atorvastatin (LIPITOR) 10 mg Oral Tablet Take 10 mg by mouth 01/28/17   Provider, Historical   cetirizine (ZYRTEC) 10 mg Oral Tablet Take 10 mg by mouth    Provider, Historical   citalopram (CELEXA) 20 mg Oral Tablet Take 20 mg by mouth 01/28/17   Provider, Historical   furosemide (LASIX) 20 mg Oral Tablet Take 2 Tabs (40 mg total) by mouth Once a day 09/25/18   Jonah Blue, FNP-BC   glipiZIDE (GLUCOTROL XL) 10 mg Oral Tablet Extended Rel 24 hr (2) Take 10 mg by mouth 02/08/17   Provider, Historical   hydrALAZINE (APRESOLINE) 10 mg Oral Tablet Take 1 Tab (10 mg total) by mouth Every 8 hours 09/25/18   Jonah Blue, FNP-BC   HYDROcodone-acetaminophen (NORCO) 5-325 mg Oral Tablet Take 1 Tab by mouth Every 4 hours as needed for Pain    Provider, Historical   hydrocortisone-pramoxine (PROCTOFOAM HC) 1-1 % Rectal Foam 1 Applicator by Rectal route Every 6 hours 09/25/18   Jonah Blue, FNP-BC   LORazepam (ATIVAN) 0.5 mg Oral Tablet 0.5 mg Once a day  10/06/17   Provider, Historical      losartan (COZAAR) 100 mg Oral Tablet Take 100 mg by mouth Once a day  01/28/17   Provider, Historical   MetFORMIN (GLUCOPHAGE) 1,000 mg Oral Tablet 1,000 mg Twice daily with food  09/16/17   Provider, Historical   metoprolol succinate (TOPROL-XL) 50 mg Oral Tablet Sustained Release 24 hr Take 0.5 Tabs (25 mg total) by mouth Once a day 09/25/18   Jonah Blue, FNP-BC   montelukast (SINGULAIR) 10 mg Oral Tablet Take 10 mg by mouth 02/08/17   Provider, Historical   nystatin (NYSTOP) 100,000 unit/gram Powder 1 Applicator by Apply Topically route Three times a day 09/25/18   Jonah Blue, FNP-BC   omeprazole (PRILOSEC) 40 mg Oral Capsule, Delayed Release(E.C.) Take 1 Cap (40 mg total) by mouth Once a day 02/07/18   Nicola Police, MD   spironolactone (ALDACTONE) 50 mg Oral Tablet Take 1 Tab (50 mg total) by mouth Every morning with breakfast 09/25/18   Jonah Blue, FNP-BC          Allergies:   Allergies   Allergen Reactions   . Sulfa (Sulfonamides) Itching       Above history reviewed with patient.  Allergies, medication list, reviewed.       Review of systoms  Constitutional: Negative for fever. Negative for chills.   Skin: Negative for rash or other lesions.   HENT: Negative for headaches.   Eyes: Negative for blurred vision and double vision.   Cardiovascular: Negative for chest pain and palpitations.   Respiratory: Negative for cough. Is not experiencing shortness of breath. No wheezing.  Gastrointestinal: Negative for nausea, vomiting and abdominal pain. No diarrhea, constipation or hematochezia  Genitourinary: Negative for dysuria, urgency, frequency, or hematuria.   Musculoskeletal: Negative for neck pain and back pain.   Neurological:  Positive for altered mental status, improved.  No numbness, tingling, or other sensation changed.  Psych:  Denies anxiety  or depression.  All other systems reviewed and are negative.      Endocrine:  Hypoglycemic episode this morning  Physical Exam  ED  Triage Vitals   Enc Vitals Group      BP       Pulse       Resp       Temp       Temp src       SpO2       Weight       Height       Head Circumference       Peak Flow       Pain Score       Pain Loc       Pain Edu?       Excl. in [place]?      Constitutional: patient is oriented to person, place, and time and well-developed, well-nourished, and in no distress.   HENT:   Head: Normocephalic and atraumatic.   Right Ear: External ear normal.   Left Ear: External ear normal.   Nose: Nose normal.   Mouth/Throat: Oropharynx is clear and moist.   Eyes: Pupils are equal, round, and reactive to light. Conjunctivae and EOM are normal.   Neck: Normal range of motion. Neck supple.   Cardiovascular: Normal rate, regular rhythm, normal heart sounds and intact distal pulses.   Pulmonary/Chest: Effort normal and breath sounds normal.   Abdominal: Soft. Bowel sounds are normal.   Musculoskeletal: Normal range of motion.   Neurological:Patient is alert and oriented to person, place, and time.  Patient has normal reflexes. Gait normal. GCS score is 15.   Skin: Skin is warm and dry.   Psychiatric: Mood, memory, affect and judgment normal.    The following orders were placed after examining the patient :  Orders Placed This Encounter   . BASIC METABOLIC PANEL   . CBC/DIFF   . CBC WITH DIFF   . EXTRA TUBES   . BLUE TOP TUBE   . LIGHT GREEN TOP TUBE   . GRAY TOP TUBE   . MORPHOLOGY   . PERFORM POC WHOLE BLOOD GLUCOSE   . INSERT & MAINTAIN PERIPHERAL IV ACCESS   . dextrose 50% (0.5 g/mL) injection - syringe   . NS bolus infusion 500 mL      No orders to display      Labs Reviewed   BASIC METABOLIC PANEL - Abnormal; Notable for the following components:       Result Value    SODIUM 128 (*)     POTASSIUM 5.7 (*)     CO2 TOTAL 20 (*)     GLUCOSE 50 (*)     BUN 34 (*)     CREATININE 1.10 (*)     ESTIMATED GFR 51 (*)     All other components within normal limits    Narrative:     Estimated Glomerular Filtration Rate (eGFR) calculated using the  CKD-EPI (2009) equation, intended for patients 67 years of age and older. If race and/or gender is not documented or "unknown," there will be no eGFR calculation.   CBC WITH DIFF - Abnormal; Notable for the following components:    WBC 3.3 (*)     PLATELETS 123 (*)     LYMPHOCYTE # 0.56 (*)     All other components within normal limits   CBC/DIFF    Narrative:     The following orders were created for panel order CBC/DIFF.  Procedure                               Abnormality         Status                     ---------                               -----------         ------                     CBC WITH YNWG[[id number]]                Abnormal            Final result                 Please view results for these tests on the individual orders.   BLUE TOP TUBE   GRAY TOP TUBE   MORPHOLOGY   EXTRA TUBES    Narrative:     The following orders were created for panel order EXTRA TUBES.  Procedure                               Abnormality         Status                     ---------                               -----------         ------                     BLUE TOP VHQI[[id number]]                                    Final result               LIGHT GREEN TOP XLKG[[id number]]                             In process                 GRAY TOP GUYQ[[id number]]                                    Final result                 Please view results for these tests on the individual orders.   LIGHT GREEN TOP TUBE   PERFORM POC WHOLE BLOOD GLUCOSE          ED Course:     Patient is on glipizide, and as such, this being a long-acting medication patient was discussed with primary care physician and will be admitted.  Dr. Ignacia Felling, is very familiar with this patient.  Patient was also discussed regarding a bump in her creatinine, and he will be monitoring this.  Please refer to his notes for further information.    Comments added  by Tawanna Sat, DO on 10/22/18 at 14:25.           Medications   dextrose 50% (0.5 g/mL) injection -  syringe (50 mL Intravenous Given 10/22/18 1116)   NS bolus infusion 500 mL (0 mL Intravenous Stopped 10/22/18 1343)      Procedures      Emergency Department Procedure:    EKG Interpertation:           Findings and diagnosis discussed with patient.  Clinical Impression:   Encounter Diagnosis   Name Primary?   . Hypoglycemia secondary to sulfonylurea Yes         Disposition: Admitted    No follow-ups on file.   New Prescriptions    No medications on file      No follow-up provider specified.   BP (!) 151/51   Pulse 83   Temp 36.5 C (97.7 F)   Resp 18   Ht 1.524 m (5')   Wt 54 kg (119 lb)   SpO2 100%   BMI 23.24 kg/m        Tawanna Sat, DO       This note was partially created using voice recognition software and is inherently subject to errors including those of syntax and "sound alike " substitutions which may escape proof reading.  In such instances, original meaning may be extrapolated by contextual derivation.

## 2018-10-23 ENCOUNTER — Ambulatory Visit (INDEPENDENT_AMBULATORY_CARE_PROVIDER_SITE_OTHER): Payer: Self-pay | Admitting: Vascular & Interventional Radiology

## 2018-10-23 LAB — BASIC METABOLIC PANEL
ANION GAP: 6 mmol/L — ABNORMAL LOW (ref 8–16)
BUN/CREA RATIO: 30
BUN: 30 mg/dL — ABNORMAL HIGH (ref 7–17)
CALCIUM: 8.6 mg/dL (ref 8.4–10.2)
CHLORIDE: 101 mmol/L (ref 98–107)
CO2 TOTAL: 22 mmol/L (ref 22–30)
CREATININE: 1 mg/dL (ref 0.52–1.04)
ESTIMATED GFR: 57 mL/min/1.73mˆ2 — ABNORMAL LOW (ref >60)
GLUCOSE: 84 mg/dL (ref 74–106)
POTASSIUM: 5.7 mmol/L — ABNORMAL HIGH (ref 3.5–5.1)
SODIUM: 129 mmol/L — ABNORMAL LOW (ref 137–145)

## 2018-10-23 LAB — LAVENDER TOP TUBE

## 2018-10-23 MED ORDER — LOSARTAN 50 MG TABLET
50.0000 mg | ORAL_TABLET | Freq: Every day | ORAL | Status: DC
Start: 2018-10-23 — End: 2018-10-30
  Administered 2018-10-23 – 2018-10-29 (×7): 50 mg via ORAL
  Filled 2018-10-23 (×7): qty 1

## 2018-10-23 MED ADMIN — nystatin 100,000 unit/gram topical powder: TOPICAL | @ 15:00:00

## 2018-10-23 MED ADMIN — sodium chloride 0.9 % (flush) injection syringe: ORAL | @ 08:00:00

## 2018-10-23 MED ADMIN — nystatin 100,000 unit/gram topical cream: TOPICAL | @ 21:00:00 | NDC 51672128901

## 2018-10-23 MED ADMIN — lactated Ringers intravenous solution: ORAL | @ 21:00:00 | NDC 00338011704

## 2018-10-23 NOTE — Care Plan (Signed)
Problem: Adult Inpatient Plan of Care  Goal: Optimal Comfort and Wellbeing  Outcome: Ongoing (see interventions/notes)     Problem: Fall Injury Risk  Goal: Absence of Fall and Fall-Related Injury  Outcome: Ongoing (see interventions/notes)     Problem: Glycemic Control Impaired  Goal: Blood Glucose Level Within Desired Range  Outcome: Ongoing (see interventions/notes)   Patient rested this shift, denies pain or discomfort.  Remains free of falls and FS glucose WDL.

## 2018-10-23 NOTE — Nurses Notes (Signed)
24hr mar check done

## 2018-10-23 NOTE — Care Plan (Signed)
Problem: Glycemic Control Impaired  Goal: Blood Glucose Level Within Desired Range  10/23/2018 2002 by Joelene Millin, LPN  Outcome: Ongoing (see interventions/notes)  10/23/2018 0636 by Joelene Millin, LPN  Outcome: Ongoing (see interventions/notes)   Patient will maintain adequate glucose control this shift.

## 2018-10-23 NOTE — Care Plan (Signed)
Problem: Adult Inpatient Plan of Care  Goal: Patient-Specific Goal (Individualization)  Outcome: Ongoing (see interventions/notes)  Flowsheets (Taken 10/22/2018 1700 by Randolm Idol, RN)  Individualized Care Needs: WNL BS  Anxieties, Fears or Concerns: none  Patient-Specific Goals (Include Timeframe): WNL BS       Patient will maintain blood glucose this shift

## 2018-10-23 NOTE — Progress Notes (Signed)
[facility]  Family Medicine  Progress Note    Date of Service:  10/23/2018  [name], [name], 72 y.o. female  Date of Admission:  10/22/2018  Date of Birth:  1946/05/29  PCP: Ignacia Felling, MD    HPI:  This patient was admitted for hyperglycemia.  She has regular patient Dr. Chestine Spore very anxious.  Sugar was 20 yesterday when EMS arrived when I checked it.  She has not had a recurrence hypoglycemic spells here hospital her most recent blood sugar was 85 on BMP.  He is tolerating a regular diet.  She has hyperkalemia with potassium 5.7.  She is on losartan and back to his health.  He she is feeling better and is wanting to go home however and really feel that this patient should be observed at least for 24 hours to make sure her sugars going to staying up.    Current Facility-Administered Medications:  apixaban (ELIQUIS) tablet 2.5 mg Oral 2x/day   aspirin (ECOTRIN) enteric coated tablet 81 mg 81 mg Oral Daily   atorvastatin (LIPITOR) tablet 10 mg Oral QPM   citalopram (CELEXA) tablet 20 mg Oral Daily   furosemide (LASIX) tablet 40 mg Oral Daily   hydrALAZINE (APRESOLINE) tablet 10 mg Oral 2x/day   HYDROcodone-acetaminophen (NORCO) 5-325 mg per tablet 1 Tab Oral Q4H PRN   hydrocortisone-pramoxine (PROCTOFOAM HC) 1-1% rectal foam 1 Applicator Rectal 2x/day PRN   loratadine (CLARITIN) tablet 10 mg Oral Daily   LORazepam (ATIVAN) tablet 0.5 mg Oral Daily   losartan (COZAAR) tablet 50 mg Oral Daily   magnesium hydroxide (MILK OF MAGNESIA) 400mg  per 68mL oral liquid 15 mL Oral Daily PRN   metoprolol succinate (TOPROL-XL) 24 hr extended release tablet 25 mg Oral Daily   montelukast (SINGULAIR) 10 mg tablet 10 mg Oral QPM   NS flush syringe 10 mL Intracatheter Q8HRS   NS flush syringe 10 mL Intracatheter Q1H PRN   nystatin (NYSTOP) 100,000 units/g topical powder 1 Applicator Apply Topically 3x/day   ondansetron (ZOFRAN ODT) rapid dissolve tablet 4 mg Oral Q6H PRN   ondansetron (ZOFRAN) 2 mg/mL  injection 4 mg Intravenous Q6H PRN   pancreatic enzyme replacement (CREON) 12,000 units lipase per cap 2 Cap Oral 3x/day-Meals   pantoprazole (PROTONIX) delayed release tablet 40 mg Oral Daily   [Held by provider] spironolactone (ALDACTONE) tablet 50 mg Oral Daily with Breakfast        Allergies   Allergen Reactions   . Sulfa (Sulfonamides) Itching             Filed Vitals:    10/22/18 1956 10/23/18 0100 10/23/18 0415 10/23/18 0716   BP: (!) 140/50 (!) 128/58 (!) 115/47 (!) 110/54   Pulse: 92 69 63 76   Resp: 20 20 20 16    Temp: 36.7 C (98 F) 36.6 C (97.9 F) 36.5 C (97.7 F) 36.8 C (98.3 F)   SpO2:           Physical Exam   Constitutional: She is oriented to person, place, and time. No distress.   HENT:   Head: Normocephalic.   Cardiovascular: Normal rate, regular rhythm and normal heart sounds.   No murmur heard.  Pulmonary/Chest: Effort normal and breath sounds normal. No respiratory distress. She has no wheezes. She has no rales.   Abdominal: Soft. Bowel sounds are normal. She exhibits no distension. There is no tenderness. There is no rebound and no guarding.   Musculoskeletal: She exhibits no edema.   Neurological: She  is alert and oriented to person, place, and time.   Skin: Skin is warm and dry. No rash noted. She is not diaphoretic. No erythema. There is pallor.       Laboratory Data:     Results for orders placed or performed during the hospital encounter of 10/22/18 (from the past 24 hour(s))   BASIC METABOLIC PANEL   Result Value Ref Range    SODIUM 128 (L) 137 - 145 mmol/L    POTASSIUM 5.7 (H) 3.5 - 5.1 mmol/L    CHLORIDE 99 98 - 107 mmol/L    CO2 TOTAL 20 (L) 22 - 30 mmol/L    ANION GAP 9 8 - 16 mmol/L    CALCIUM 9.1 8.4 - 10.2 mg/dL    GLUCOSE 50 (L) 74 - 106 mg/dL    BUN 34 (H) 7 - 17 mg/dL    CREATININE 1.10 (H) 0.52 - 1.04 mg/dL    BUN/CREA RATIO 31     ESTIMATED GFR 51 (L) >60 mL/min/1.66m^2   CBC WITH DIFF   Result Value Ref Range    WBC 3.3 (L) 3.7 - 11.0 x10^3/uL    RBC 3.88 3.85 - 5.22  x10^6/uL    HGB 11.9 11.5 - 16.0 g/dL    HCT 35.7 34.8 - 46.0 %    MCV 92.0 78.0 - 100.0 fL    MCH 30.7 26.0 - 32.0 pg    MCHC 33.3 31.0 - 35.5 g/dL    RDW-CV 13.8 11.5 - 15.5 %    PLATELETS 123 (L) 150 - 400 x10^3/uL    MPV 10.8 8.7 - 12.5 fL    NEUTROPHIL % 73 %    LYMPHOCYTE % 17 %    MONOCYTE % 7 %    EOSINOPHIL % 1 %    BASOPHIL % 1 %    NEUTROPHIL # 2.40 1.50 - 7.70 x10^3/uL    LYMPHOCYTE # 0.56 (L) 1.00 - 4.80 x10^3/uL    MONOCYTE # 0.22 0.20 - 1.10 x10^3/uL    EOSINOPHIL # <0.10 <=0.50 x10^3/uL    BASOPHIL # <0.10 <=0.20 x10^3/uL    IMMATURE GRANULOCYTE % 1 0 - 1 %    IMMATURE GRANULOCYTE # <0.10 <0.10 x10^3/uL   MORPHOLOGY   Result Value Ref Range    RBC MORPHOLOGY Normal RBC and PLT Morphology    BLUE TOP TUBE   Result Value Ref Range    RAINBOW/EXTRA TUBE AUTO RESULT Yes    LIGHT GREEN TOP TUBE   Result Value Ref Range    RAINBOW/EXTRA TUBE AUTO RESULT Yes    GRAY TOP TUBE   Result Value Ref Range    RAINBOW/EXTRA TUBE AUTO RESULT Yes    BASIC METABOLIC PANEL   Result Value Ref Range    SODIUM 129 (L) 137 - 145 mmol/L    POTASSIUM 5.7 (H) 3.5 - 5.1 mmol/L    CHLORIDE 101 98 - 107 mmol/L    CO2 TOTAL 22 22 - 30 mmol/L    ANION GAP 6 (L) 8 - 16 mmol/L    CALCIUM 8.6 8.4 - 10.2 mg/dL    GLUCOSE 84 74 - 106 mg/dL    BUN 30 (H) 7 - 17 mg/dL    CREATININE 1.00 0.52 - 1.04 mg/dL    BUN/CREA RATIO 30     ESTIMATED GFR 57 (L) >60 mL/min/1.53m^2       Imaging Studies:    No orders to display       Assessment/Plan:  Hospital Problems  1Hypoglycemia associated with type 2 diabetes mellitus (CMS [place])         Date Noted: 10/22/2018      Hyperkalemia         Date Noted: 10/22/2018      Type 2 diabetes mellitus (CMS [facility])         Date Noted: 01/24/2017      Resolved Hospital Problems  No resolved problems to display.          Continue observation in this patient until tomorrow.  Will check a basic metabolic panel in morning.  Will continue to hold her Aldactone and perhaps that needs to be discontinued on discharge.  I  have decreased her losartan as her potassium was high and her blood pressure was 110/60 this morning.  Medications she is on her admission for her blood sugar will need to be stopped.  Perhaps of letting her have a sugar in the range of 150-200 lb less than trauma to her than having hypoglycemic reaction to the severity that she had is discussed with her.  Will see how she does over the next 24 hours prior for    Myrene Buddy, MD

## 2018-10-23 NOTE — Care Management Notes (Signed)
10/23/18 1620   Assessment Details   Assessment Type Admission   Date of Care Management Update 10/23/18   Readmission   Is this a readmission? No   Medicare Intent to Discharge Documentation   Admit IMM given to:   (NA)   Care Management Plan   Discharge Planning Status initial meeting   Projected Discharge Date 10/24/18   CM will evaluate for rehabilitation potential yes   Facility or Agency Preferences Has [facility] home [facility] already in place prior to admit for PT and nursing    Discharge Needs Assessment   Equipment Currently Used at Home other (see comments)  (rollator walker )   Equipment Needed After Discharge none   Discharge Facility/Level of Care Needs Home with Home Health (code 6)   Transportation Available car;family or friend will provide   Referral Information   Admission Type observation   Address Verified verified-no changes   Arrived From home or self-care   Insurance Verified verified-no change   Observation Form   Observation Form Given MOON form given   MOON form given to Patient    MOON form date of delivery 10/22/18   MOON form time of delivery 1502   ADVANCE DIRECTIVES   Does the Patient have an Advance Directive? Yes, Patient Does Have Advance Directive for Healthcare Treatment   Document the Substance of the Advance Directive (Required) MPOA   Type of Advance Directive Completed Medical Power of Attorney   Copy of Advance Directives in Chart? 0   Name of [place]    Phone Number of [place] or Healthcare Surrogate [phone]   Patient Requests Assistance in Having Advance Directive Notarized. N/A   LAY CAREGIVER    Appointed Lay Caregiver? I Decline   Employment/Financial   Patient has Prescription Coverage?  Yes        Name of Insurance Coverage for Medications Medicare and [place] an age group to open "lives with" row.  Adult   Lives With sibling(s)   Living Arrangements house   Able to  Return to Prior Arrangements yes   Grade 12   Home Safety   Home Assessment: No Problems Identified   Home Accessibility no concerns;ramps present at home   Legal Issues   Do you have a court appointed guardian/conservator? No   Patient Hand-Off   Clinical/Discharge Plan of Care Information Communicated to:  Clinical Care Coordinator

## 2018-10-24 ENCOUNTER — Observation Stay (HOSPITAL_COMMUNITY): Payer: Medicare Other

## 2018-10-24 DIAGNOSIS — R51 Headache: Secondary | ICD-10-CM | POA: Diagnosis not present

## 2018-10-24 DIAGNOSIS — E871 Hypo-osmolality and hyponatremia: Secondary | ICD-10-CM | POA: Diagnosis present

## 2018-10-24 DIAGNOSIS — R519 Headache, unspecified: Secondary | ICD-10-CM | POA: Diagnosis not present

## 2018-10-24 LAB — BASIC METABOLIC PANEL, FASTING
ANION GAP: 5 mmol/L — ABNORMAL LOW (ref 8–16)
BUN/CREA RATIO: 24
BUN: 24 mg/dL — ABNORMAL HIGH (ref 7–17)
CALCIUM: 8.5 mg/dL (ref 8.4–10.2)
CHLORIDE: 98 mmol/L (ref 98–107)
CO2 TOTAL: 22 mmol/L (ref 22–30)
CREATININE: 1 mg/dL (ref 0.52–1.04)
ESTIMATED GFR: 57 mL/min/1.73mˆ2 — ABNORMAL LOW (ref >60)
GLUCOSE: 147 mg/dL — ABNORMAL HIGH (ref 74–106)
POTASSIUM: 5 mmol/L (ref 3.5–5.1)
SODIUM: 125 mmol/L — ABNORMAL LOW (ref 137–145)

## 2018-10-24 LAB — ECG 12-LEAD
Atrial Rate: 70 {beats}/min
Calculated P Axis: 33 degrees
Calculated R Axis: -4 degrees
Calculated T Axis: 60 degrees
PR Interval: 192 ms
QRS Duration: 84 ms
QT Interval: 360 ms
QT Interval: 360 ms
QTC Calculation: 388 ms
Ventricular rate: 70 {beats}/min

## 2018-10-24 LAB — URINALYSIS, MACROSCOPIC
BILIRUBIN: NEGATIVE mg/dL
BLOOD: NEGATIVE mg/dL
GLUCOSE: 250 mg/dL — AB
KETONES: NEGATIVE mg/dL
LEUKOCYTES: NEGATIVE WBCs/uL
NITRITE: NEGATIVE
PH: 5.5 (ref 5.0–8.5)
SPECIFIC GRAVITY: 1.01 (ref 1.005–1.030)
UROBILINOGEN: 0.2 mg/dL

## 2018-10-24 LAB — TROPONIN-I: TROPONIN I: <0.01 ng/mL (ref 0.00–0.03)

## 2018-10-24 LAB — URINALYSIS, MICROSCOPIC

## 2018-10-24 LAB — CREATINE KINASE (CK), MB FRACTION: CK-MB: 0.5 ng/mL (ref 0.0–3.4)

## 2018-10-24 MED ORDER — SODIUM CHLORIDE 0.9 % INTRAVENOUS SOLUTION
INTRAVENOUS | Status: DC
Start: 2018-10-24 — End: 2018-10-26

## 2018-10-24 MED ORDER — METFORMIN 500 MG TABLET
1000.00 mg | ORAL_TABLET | Freq: Two times a day (BID) | ORAL | Status: DC
Start: 2018-10-24 — End: 2018-10-25
  Administered 2018-10-24: 0 mg via ORAL
  Administered 2018-10-25 (×2): 1000 mg via ORAL
  Filled 2018-10-24 (×4): qty 2

## 2018-10-24 MED ADMIN — spironolactone 25 mg tablet: @ 22:00:00

## 2018-10-24 NOTE — Nurses Notes (Signed)
OUT OF ROOM FOR MRI OF THE BRAIN.

## 2018-10-24 NOTE — Nurses Notes (Signed)
Patient c/o chest pain mid sternum without radiation.  Skin pale, warm and dry.  Dr Alcide Evener notified.  Orders given for Stat EKG and labs.

## 2018-10-24 NOTE — Nurses Notes (Signed)
24hr mar check done

## 2018-10-24 NOTE — Progress Notes (Signed)
[facility]  Family Medicine  Progress Note    Date of Service:  10/24/2018  [name], [name], 72 y.o. female  Date of Admission:  10/22/2018  Date of Birth:  Apr 18, 1946  PCP: Ignacia Felling, MD    HPI:  Pt. Complains of bad headache and nausea ass.  Feels weak.        Current Facility-Administered Medications:  apixaban (ELIQUIS) tablet 2.5 mg Oral 2x/day   aspirin (ECOTRIN) enteric coated tablet 81 mg 81 mg Oral Daily   atorvastatin (LIPITOR) tablet 10 mg Oral QPM   citalopram (CELEXA) tablet 20 mg Oral Daily   furosemide (LASIX) tablet 40 mg Oral Daily   hydrALAZINE (APRESOLINE) tablet 10 mg Oral 2x/day   HYDROcodone-acetaminophen (NORCO) 5-325 mg per tablet 1 Tab Oral Q4H PRN   hydrocortisone-pramoxine (PROCTOFOAM HC) 1-1% rectal foam 1 Applicator Rectal 2x/day PRN   loratadine (CLARITIN) tablet 10 mg Oral Daily   LORazepam (ATIVAN) tablet 0.5 mg Oral Daily   losartan (COZAAR) tablet 50 mg Oral Daily   magnesium hydroxide (MILK OF MAGNESIA) 400mg  per 18mL oral liquid 15 mL Oral Daily PRN   metFORMIN (GLUCOPHAGE) tablet 1,000 mg Oral 2x/day-Food   metoprolol succinate (TOPROL-XL) 24 hr extended release tablet 25 mg Oral Daily   montelukast (SINGULAIR) 10 mg tablet 10 mg Oral QPM   NS flush syringe 10 mL Intracatheter Q8HRS   NS flush syringe 10 mL Intracatheter Q1H PRN   NS premix infusion  Intravenous Continuous   nystatin (NYSTOP) 100,000 units/g topical powder 1 Applicator Apply Topically 3x/day   ondansetron (ZOFRAN ODT) rapid dissolve tablet 4 mg Oral Q6H PRN   ondansetron (ZOFRAN) 2 mg/mL injection 4 mg Intravenous Q6H PRN   pancreatic enzyme replacement (CREON) 12,000 units lipase per cap 2 Cap Oral 3x/day-Meals   pantoprazole (PROTONIX) delayed release tablet 40 mg Oral Daily   [Held by provider] spironolactone (ALDACTONE) tablet 50 mg Oral Daily with Breakfast        Allergies   Allergen Reactions   . Sulfa (Sulfonamides) Itching             Filed Vitals:    10/24/18 1545 10/24/18  2000 10/24/18 2100 10/24/18 2116   BP: 107/61  114/62 114/62   Pulse: 75  82    Resp: 16  18    Temp: 36.3 C (97.4 F)  36.4 C (97.5 F)    SpO2:  98%         Physical Exam   Constitutional: She is oriented to person, place, and time.   Pale, chronically ill appearing, nad   HENT:   Head: Normocephalic and atraumatic.   Right Ear: External ear normal.   Left Ear: External ear normal.   Mouth/Throat: Oropharynx is clear and moist.   Eyes: Pupils are equal, round, and reactive to light. EOM are normal.   Neck: No tracheal deviation present. No thyromegaly present.   Cardiovascular: Normal rate, regular rhythm and normal heart sounds.   No murmur heard.  Pulmonary/Chest: Effort normal and breath sounds normal.   Abdominal: Soft. She exhibits no mass. There is no tenderness. There is no guarding.   Musculoskeletal: She exhibits no edema, tenderness or deformity.   Lymphadenopathy:     She has no cervical adenopathy.   Neurological: She is alert and oriented to person, place, and time. No cranial nerve deficit. Gait normal.   Skin: Skin is warm and dry. No rash noted.   Psychiatric: Affect and judgment normal.  Laboratory Data:     Results for orders placed or performed during the hospital encounter of 10/22/18 (from the past 24 hour(s))   BASIC METABOLIC PANEL, FASTING   Result Value Ref Range    SODIUM 125 (L) 137 - 145 mmol/L    POTASSIUM 5.0 3.5 - 5.1 mmol/L    CHLORIDE 98 98 - 107 mmol/L    CO2 TOTAL 22 22 - 30 mmol/L    ANION GAP 5 (L) 8 - 16 mmol/L    CALCIUM 8.5 8.4 - 10.2 mg/dL    GLUCOSE 147 (H) 74 - 106 mg/dL    BUN 24 (H) 7 - 17 mg/dL    CREATININE 1.00 0.52 - 1.04 mg/dL    BUN/CREA RATIO 24     ESTIMATED GFR 57 (L) >60 mL/min/1.36m^2   URINALYSIS, MACROSCOPIC   Result Value Ref Range    COLOR Yellow Yellow, Straw    APPEARANCE Clear Clear, Slightly Hazy    SPECIFIC GRAVITY 1.010 1.005 - 1.030    PH 5.5 5.0 - 8.5    LEUKOCYTES Negative Negative WBCs/uL    NITRITE Negative Negative    PROTEIN Negative  Negative mg/dL    GLUCOSE 250  (A) Negative mg/dL    KETONES Negative Negative mg/dL    UROBILINOGEN 0.2  0.2 mg/dL    BILIRUBIN Negative Negative mg/dL    BLOOD Negative Negative mg/dL   URINALYSIS, MICROSCOPIC   Result Value Ref Range    RBCS 6-10 (A) None, Occasional, 0-2, 3-5 /hpf    WBCS None None, Occasional, 0-2, 3-5 /hpf    BACTERIA Rare (A) None /hpf    SQUAMOUS EPITHELIAL 16-20 (A) None, Occasional, 0-2, 3-5 /hpf    MUCOUS Few (A) None /hpf   ECG 12-LEAD   Result Value Ref Range    Ventricular rate 70 BPM    Atrial Rate 70 BPM    PR Interval 192 ms    QRS Duration 84 ms    QT Interval 360 ms    QTC Calculation 388 ms    Calculated P Axis 33 degrees    Calculated R Axis -4 degrees    Calculated T Axis 60 degrees   TROPONIN-I   Result Value Ref Range    TROPONIN I <0.01 0.00 - 0.03 ng/mL   CREATINE KINASE (CK), MB FRACTION   Result Value Ref Range    CK-MB 0.5 0.0 - 3.4 ng/mL       Imaging Studies:    * MRI BRAIN WO CONTRAST   Final Result   Impression:      Questionable bilateral mastoiditis.      Negative for acute infarction.      No shift in midline structures. Negative for acute intracranial hemorrhage.   Tumor is not seen.            Radiologist location ID: [id number]             Assessment/Plan:  Hospital Problems    1Hypoglycemia associated with type 2 diabetes mellitus (CMS [place])         Date Noted: 10/22/2018      Headache         Date Noted: 10/24/2018      Hyponatremia         Date Noted: 10/24/2018      Hyperkalemia         Date Noted: 10/22/2018      Type 2 diabetes mellitus (CMS [place])  Date Noted: 01/24/2017      Resolved Hospital Problems  No resolved problems to display.      MRI head  PT consult for strengthening  NS 125 ml/hr    Ignacia Felling, MD

## 2018-10-24 NOTE — Care Plan (Signed)
Problem: Glycemic Control Impaired  Goal: Blood Glucose Level Within Desired Range  Outcome: Ongoing (see interventions/notes)  Note:   PT. WILL MAINTAIN GLUCOSE LEVEL OF 70 MG/DL OR ABOVE THIS SHIFT.    [facility] Plan Note    Situation:  PT. HAVING DECREASED BLOOD GLUCOSE LEVELS.     Intervention: ACCU-CHECKS WILL BE DONE Q4 HOURS AS ORDERED, SNACKS AND FOOD WILL BE PROVIDED PRN TO MAINTAIN NORMAL  BLOOD GLUCOSE LEVELS.     Response: PT.'S BLOOD SUGARS WERE 107, 200 AND 307 MG/DL THIS SHIFT, DR.Marland Kitchen BRUCE GREENBERG WAS NOTIFIED AND PT. HOME MEDICATION OF METFORMIN WAS ORDERED.       Jamelle Haring, LPN

## 2018-10-25 LAB — COMPREHENSIVE METABOLIC PANEL, NON-FASTING
ALBUMIN: 2.1 g/dL — ABNORMAL LOW (ref 3.5–5.0)
ALKALINE PHOSPHATASE: 47 U/L (ref 38–126)
ALT (SGPT): 11 U/L (ref 0–34)
ALT (SGPT): 11 U/L (ref 0–34)
ANION GAP: 4 mmol/L — ABNORMAL LOW (ref 8–16)
ANION GAP: 4 mmol/L — ABNORMAL LOW (ref 8–16)
AST (SGOT): 20 U/L (ref 14–36)
BILIRUBIN TOTAL: 0.5 mg/dL (ref 0.2–1.3)
BUN/CREA RATIO: 21
BUN: 19 mg/dL — ABNORMAL HIGH (ref 7–17)
CALCIUM: 7.8 mg/dL — ABNORMAL LOW (ref 8.4–10.2)
CHLORIDE: 98 mmol/L (ref 98–107)
CO2 TOTAL: 20 mmol/L — ABNORMAL LOW (ref 22–30)
CREATININE: 0.9 mg/dL (ref 0.52–1.04)
ESTIMATED GFR: >60 mL/min/{1.73_m2} (ref >60)
GLUCOSE: 167 mg/dL — ABNORMAL HIGH (ref 74–106)
POTASSIUM: 4.6 mmol/L (ref 3.5–5.1)
PROTEIN TOTAL: 4.1 g/dL — ABNORMAL LOW (ref 6.3–8.2)
SODIUM: 122 mmol/L — ABNORMAL LOW (ref 137–145)
SODIUM: 122 mmol/L — ABNORMAL LOW (ref 137–145)

## 2018-10-25 MED ORDER — INSULIN GLARGINE 100 UNITS/ML SUBQ - CHARGE BY DOSE
10.00 [IU] | Freq: Every evening | SUBCUTANEOUS | Status: DC
Start: 2018-10-25 — End: 2018-10-27
  Administered 2018-10-25 – 2018-10-26 (×2): 10 [IU] via SUBCUTANEOUS
  Filled 2018-10-25 (×2): qty 10

## 2018-10-25 MED ORDER — DEXTROSE 50 % IN WATER (D50W) INTRAVENOUS SYRINGE
12.50 g | INJECTION | INTRAVENOUS | Status: DC | PRN
Start: 2018-10-25 — End: 2018-10-30

## 2018-10-25 MED ORDER — INSULIN LISPRO 100 UNIT/ML SUB-Q SSIP - RMH
0.00 [IU] | INJECTION | Freq: Four times a day (QID) | SUBCUTANEOUS | Status: DC | PRN
Start: 2018-10-25 — End: 2018-10-30
  Administered 2018-10-27: 12:00:00 6 [IU] via SUBCUTANEOUS
  Filled 2018-10-25: qty 6

## 2018-10-25 MED ADMIN — LORazepam 0.5 mg tablet: ORAL | @ 09:00:00

## 2018-10-25 MED ADMIN — sodium chloride 0.9 % intravenous solution: ORAL | @ 20:00:00 | NDC 00338004903

## 2018-10-25 MED ADMIN — oxyCODONE-acetaminophen 5 mg-325 mg tablet: ORAL | @ 17:00:00

## 2018-10-25 MED ADMIN — fentaNYL (PF) 50 mcg/mL intravenous solution: ORAL | @ 20:00:00

## 2018-10-25 NOTE — Nurses Notes (Signed)
Pt ambulated around the hallway x 2 with P.T.  Ask the pt to have family bring in the Creon from home so that it can be given to her (pt) while in hospital.

## 2018-10-25 NOTE — Progress Notes (Signed)
[facility]  Family Medicine  Progress Note    Date of Service:  10/25/2018  [name], [name], 72 y.o. female  Date of Admission:  10/22/2018  Date of Birth:  [birth year]  PCP: Ignacia Felling, MD    HPI:  The patient still complains of some headaches and nausea associated with that eating some was able to ambulate handle transfers well with the assistance of the physical therapy home.  [name] patient's sodium is worse this morning at 1:22 a.m. Was 125 yesterday 129 the day before not sure what is causing this.  I reviewed her medications and she is on furosemide which is possibly could be causing this.  She does not have significant edema right now that she does sometimes get dependent edema and has had CHF in the past and will go ahead and stop her Lasix and see how she does on.  Will go ahead and obtain a serum are skinny urine osmolality and urine sodium.  She has chronic diarrhea on the stop her metformin and place her on a low-dose of insulin 10 units of Lantus at night and a sliding scale conservative.  Will recheck labs in the morning.  She      Current Facility-Administered Medications:  apixaban (ELIQUIS) tablet 2.5 mg Oral 2x/day   aspirin (ECOTRIN) enteric coated tablet 81 mg 81 mg Oral Daily   atorvastatin (LIPITOR) tablet 10 mg Oral QPM   dextrose 50% (0.5 g/mL) injection - syringe 12.5 g Intravenous Q15 Min PRN   hydrALAZINE (APRESOLINE) tablet 10 mg Oral 2x/day   HYDROcodone-acetaminophen (NORCO) 5-325 mg per tablet 1 Tab Oral Q4H PRN   hydrocortisone-pramoxine (PROCTOFOAM HC) 1-1% rectal foam 1 Applicator Rectal 2x/day PRN   insulin glargine (LANTUS) 100 units/mL injection 10 Units Subcutaneous NIGHTLY   loratadine (CLARITIN) tablet 10 mg Oral Daily   LORazepam (ATIVAN) tablet 0.5 mg Oral Daily   losartan (COZAAR) tablet 50 mg Oral Daily   magnesium hydroxide (MILK OF MAGNESIA) 400mg  per 66mL oral liquid 15 mL Oral Daily PRN   metoprolol succinate (TOPROL-XL) 24 hr extended  release tablet 25 mg Oral Daily   montelukast (SINGULAIR) 10 mg tablet 10 mg Oral QPM   NS flush syringe 10 mL Intracatheter Q8HRS   NS flush syringe 10 mL Intracatheter Q1H PRN   NS premix infusion  Intravenous Continuous   nystatin (NYSTOP) 100,000 units/g topical powder 1 Applicator Apply Topically 3x/day   ondansetron (ZOFRAN ODT) rapid dissolve tablet 4 mg Oral Q6H PRN   ondansetron (ZOFRAN) 2 mg/mL injection 4 mg Intravenous Q6H PRN   pancreatic enzyme replacement (CREON) 12,000 units lipase per cap 2 Cap Oral 3x/day-Meals   pantoprazole (PROTONIX) delayed release tablet 40 mg Oral Daily   SSIP insulin lispro (HUMALOG) 100 units/mL injection 0-12 Units Subcutaneous 4x/day PRN        Allergies   Allergen Reactions   . Sulfa (Sulfonamides) Itching             Filed Vitals:    10/25/18 0933 10/25/18 1154 10/25/18 1500 10/25/18 1900   BP: (!) 152/68 108/82 131/62 (!) 102/57   Pulse:  79 83 75   Resp:  16 16 20    Temp:  36.6 C (97.8 F) 36.8 C (98.2 F) 36.6 C (97.8 F)   SpO2:           Physical Exam   Constitutional: She is oriented to person, place, and time.   Pale and chronically ill appearing   HENT:  Head: Normocephalic and atraumatic.   Right Ear: External ear normal.   Left Ear: External ear normal.   Mouth/Throat: Oropharynx is clear and moist.   Eyes: Pupils are equal, round, and reactive to light. EOM are normal.   Neck: No tracheal deviation present. No thyromegaly present.   Cardiovascular: Normal rate, regular rhythm and normal heart sounds.   No murmur heard.  Pulmonary/Chest: Effort normal and breath sounds normal.   Abdominal: Soft. She exhibits distension. She exhibits no mass. There is no tenderness. There is no guarding.   Bowel sounds high pitched and tinkling   Musculoskeletal: She exhibits no edema, tenderness or deformity.   Lymphadenopathy:     She has no cervical adenopathy.   Neurological: She is alert and oriented to person, place, and time. No cranial nerve deficit. Gait normal.      Skin: Skin is warm and dry. No rash noted.   Psychiatric: Affect and judgment normal.     Laboratory Data:     Results for orders placed or performed during the hospital encounter of 10/22/18 (from the past 24 hour(s))   COMPREHENSIVE METABOLIC PANEL, NON-FASTING   Result Value Ref Range    SODIUM 122 (L) 137 - 145 mmol/L    POTASSIUM 4.6 3.5 - 5.1 mmol/L    CHLORIDE 98 98 - 107 mmol/L    CO2 TOTAL 20 (L) 22 - 30 mmol/L    ANION GAP 4 (L) 8 - 16 mmol/L    BUN 19 (H) 7 - 17 mg/dL    CREATININE 0.90 0.52 - 1.04 mg/dL    BUN/CREA RATIO 21     ESTIMATED GFR >60 >60 mL/min/1.37m^2    ALBUMIN 2.1 (L) 3.5 - 5.0 g/dL    CALCIUM 7.8 (L) 8.4 - 10.2 mg/dL    GLUCOSE 167 (H) 74 - 106 mg/dL    ALKALINE PHOSPHATASE 47 38 - 126 U/L    ALT (SGPT) 11 0 - 34 U/L    AST (SGOT) 20 14 - 36 U/L    BILIRUBIN TOTAL 0.5 0.2 - 1.3 mg/dL    PROTEIN TOTAL 4.1 (L) 6.3 - 8.2 g/dL       Imaging Studies:    * MRI BRAIN WO CONTRAST   Final Result   Impression:      Questionable bilateral mastoiditis.      Negative for acute infarction.      No shift in midline structures. Negative for acute intracranial hemorrhage.   Tumor is not seen.            Radiologist location ID: [id number]             Assessment/Plan:  Hospital Problems    1Hypoglycemia associated with type 2 diabetes mellitus (CMS [place])         Date Noted: 10/22/2018      Headache         Date Noted: 10/24/2018      Hyponatremia         Date Noted: 10/24/2018      Hyperkalemia         Date Noted: 10/22/2018      Type 2 diabetes mellitus (CMS [facility])         Date Noted: 01/24/2017      Resolved Hospital Problems  No resolved problems to display.      Check urine sodium and osmolality  DC Lasix and metformin  Lantus 10 units qhs and house sliding scale - lispro    Ignacia Felling,  MD

## 2018-10-25 NOTE — Care Plan (Signed)
[facility]  Physical Therapy Initial Evaluation    Patient Name: [name]  Date of Birth: 05-09-1946  Height: Height: 152.4 cm (5')  Weight: Weight: 58.3 kg (128 lb 8 oz)  Room/Bed: 1134/A  Payor: MEDICARE / Plan: MEDICARE PART A AND B / Product Type: Medicare /     Assessment:      (P) Patient did well with PT and only needed stand by assist with her transfers and gait with use of a FWW. She has good potential to achieve modified independence with PT intervention by discharge.    Discharge Needs:    Equipment Recommendation: (P) none anticipated. She has a rollato walker at home.         Discharge Disposition: (P) home       Plan:   Current Intervention: (P) gait training, bed mobility training, strengthening, transfer training  To provide physical therapy services (P) minimum of 5x/week  for duration of (P) until discharge.    The risks/benefits of therapy have been discussed with the patient/caregiver and he/she is in agreement with the established plan of care.       Subjective & Objective        10/25/18 1510   Rehab Session   Document Type evaluation   Total PT Minutes: 35   Patient Effort good   Symptoms Noted During/After Treatment none   General Information   Patient Profile Reviewed? yes   Respiratory Status room air   Existing Precautions/Restrictions no known precautions/limitations   Mutuality/Individual Preferences   Patient-Specific Goals (Include Timeframe) move better and return home   Living Environment   Lives With sibling(s)   Living Arrangements house   Home Accessibility ramps present at home   Functional Level Prior   Ambulation 1 - assistive equipment   Transferring 1 - assistive equipment   Pre Treatment Status   Pre Treatment Patient Status Patient supine in bed   Support Present Pre Treatment  None   Cognitive Assessment/Interventions   Behavior/Mood Observations behavior appropriate to situation, WNL/WFL   Attention WNL/WFL      Follows Commands WFL   Vital Signs   Pre-Treatment Heart Rate (beats/min) 97   Pre SpO2 (%) 97   O2 Delivery Pre Treatment room air   Pain Assessment   Pain Scale: Numbers, Pretreatment 6/10   Pain Location - Side Bilateral   Pain Location - Orientation anterior   Pain Location head   Pre/Post Treatment Pain Comment   (no change with gait)   RUE Assessment   RUE Assessment WFL for stated baseline   LUE Assessment   LUE Assessment WFL for stated baseline   RLE Assessment   RLE Assessment WFL for stated baseline   LLE Assessment   LLE Assessment WFL for stated baseline   Bed Mobility Assessment/Treatment   Supine-Sit Independence stand-by assistance   Sit to Supine, Independence stand-by assistance   Transfer Assessment/Treatment   Sit-Stand Independence stand-by assistance   Stand-Sit Independence stand-by assistance   Sit-Stand-Sit, Assist Device walker, four wheeled   Bed-Chair Independence stand-by assistance   Chair-Bed Independence stand-by assistance   Gait Assessment/Treatment   Independence  contact guard assist   Assistive Device  walker, four wheeled   Distance in Feet 210 x 2   Total Distance Ambulated 420   Maintain Weight Bearing Status able to maintain   Balance Skill Training   Sitting Balance: Static good balance   Sitting, Dynamic (Balance) good balance  Sit-to-Stand Balance good balance   Standing Balance: Static good balance   Standing Balance: Dynamic good balance   Post Treatment Status   Post Treatment Patient Status Patient sitting on edge of bed   Support Present Post Treatment  Other (See comments)   Communication Post Treatement   (CNA)   Plan of Care Review   Plan Of Care Reviewed With patient   Physical Therapy Clinical Impression   Assessment Patient did well with PT and only needed stand by assist with her transfers and gait with use of a FWW.   Criteria for Skilled Therapeutic yes   Rehab Potential good, to achieve stated therapy goals   Therapy Frequency minimum of 5x/week    Predicted Duration of Therapy Intervention (days/wks) until discharge   Anticipated Equipment Needs at Discharge (PT) none anticipated   Anticipated Discharge Disposition home   Referral Needed to Another Service home health care   Care Plan Goals   PT Rehab Goals Bed Mobility Goal;Gait Training Goal;Strength Goal;Transfer Training Goal   Bed Mobility Goal   Bed Mobility Goal, Date Established 10/25/18   Bed Mobility Goal, Time to Achieve by discharge   Bed Mobility Goal, Activity Type supine to sit/sit to supine;roll left/roll right   Bed Mobility Goal, Independence Level modified independence   Bed Mobility Goal, Assistive Device least restrictive assistive device   Gait Training  Goal, Distance to Achieve   Gait Training  Goal, Date Established 10/25/18   Gait Training  Goal, Time to Achieve by discharge   Gait Training  Goal, Independence Level modified independence   Gait Training  Goal, Assist Device walker, rolling   Gait Training  Goal, Distance to Achieve 500   Strength Goal   Strength Goal, Date Established 10/25/18   Strength Goal, Time to Achieve by discharge   Strength Goal, Measure to Achieve   (Promote strength of all 4's  to 4+-5/5)   Transfer Training Goal   Transfer Training Goal, Date Established 10/25/18   Transfer Training Goal, Time to Achieve by discharge   Transfer Training Goal, Activity Type bed-to-chair/chair-to-bed;sit-to-stand/stand-to-sit   Transfer Training Goal, Independence Level modified independence   Transfer Training Goal, Assist Device walker, rolling   Planned Therapy Interventions, PT Eval   Planned Therapy Interventions (PT) gait training;bed mobility training;strengthening;transfer training       Therapist:   Jessie Foot, PT, DPT

## 2018-10-25 NOTE — Care Plan (Signed)
Problem: Adult Inpatient Plan of Care  Goal: Optimal Comfort and Wellbeing  Outcome: Ongoing (see interventions/notes)     Problem: Fall Injury Risk  Goal: Absence of Fall and Fall-Related Injury  Outcome: Ongoing (see interventions/notes)     Problem: Glycemic Control Impaired  Goal: Blood Glucose Level Within Desired Range  Outcome: Ongoing (see interventions/notes)   Patient appeared to rest comfortable this shift with no complaints of pain or discomfort.  Free from falls and no hypoglycemic episodes noted.  Discussed obtaining education for diet to control glucose.

## 2018-10-25 NOTE — Care Plan (Signed)
Blood sugars are ranging from low 200's to high 200"s

## 2018-10-26 LAB — BASIC METABOLIC PANEL
ANION GAP: 4 mmol/L — ABNORMAL LOW (ref 8–16)
BUN/CREA RATIO: 18
BUN: 16 mg/dL (ref 7–17)
CALCIUM: 7.5 mg/dL — ABNORMAL LOW (ref 8.4–10.2)
CHLORIDE: 102 mmol/L (ref 98–107)
CO2 TOTAL: 20 mmol/L — ABNORMAL LOW (ref 22–30)
CREATININE: 0.9 mg/dL (ref 0.52–1.04)
ESTIMATED GFR: >60 mL/min/1.73mˆ2 (ref >60)
GLUCOSE: 73 mg/dL — ABNORMAL LOW (ref 74–106)
POTASSIUM: 4.2 mmol/L (ref 3.5–5.1)
SODIUM: 126 mmol/L — ABNORMAL LOW (ref 137–145)

## 2018-10-26 MED ORDER — COSYNTROPIN 0.25 MG SOLUTION FOR INJECTION
0.2500 mg | Freq: Once | INTRAMUSCULAR | Status: AC
Start: 2018-10-27 — End: 2018-10-27
  Administered 2018-10-27: 0.25 mg via INTRAVENOUS
  Filled 2018-10-26: qty 5

## 2018-10-26 MED ORDER — OXYMETAZOLINE 0.05 % NASAL SPRAY
1.00 | Freq: Two times a day (BID) | NASAL | Status: AC
Start: 2018-10-26 — End: 2018-10-29
  Administered 2018-10-26 – 2018-10-27 (×3): 0 via NASAL
  Administered 2018-10-28 (×2): 1 via NASAL
  Administered 2018-10-29: 0 via NASAL
  Filled 2018-10-26: qty 15

## 2018-10-26 MED ORDER — KETOROLAC 30 MG/ML (1 ML) INJECTION SOLUTION
30.0000 mg | Freq: Once | INTRAMUSCULAR | Status: AC
Start: 2018-10-26 — End: 2018-10-26
  Administered 2018-10-26: 30 mg via INTRAVENOUS
  Filled 2018-10-26: qty 1

## 2018-10-26 NOTE — Nurses Notes (Signed)
Patient sitting up in bed eating at this time, no distress noted or complaints voiced

## 2018-10-26 NOTE — Nurses Notes (Signed)
Patient is up and ambulating with physical therapy at this time.

## 2018-10-26 NOTE — Care Plan (Signed)
Problem: Fall Injury Risk  Goal: Absence of Fall and Fall-Related Injury  Outcome: Ongoing (see interventions/notes)  Patient without falls, fall precautions in place. Patient assisted with ambulation, call light in reach, clear path

## 2018-10-26 NOTE — Care Management Notes (Signed)
Discussed discharge planning with Dr. Ignacia Felling. DR. Jones Skene states will reevaluate Na level. If Na level increased will most likely discharge to home. If patient continues to have decreased Na level, Dr. Jones Skene states will consider swing bed placement and consult PT as well.

## 2018-10-26 NOTE — Progress Notes (Signed)
[facility]  Family Medicine  Progress Note    Date of Service:  10/26/2018  [name], [name], 72 y.o. female  Date of Admission:  10/22/2018  Date of Birth:  03/05/1946  PCP: Ignacia Felling, MD    HPI:  The patient still has a headache and on.  She is still, generally weak is able to ambulate with a walker on her own.  Her sodium is 126 on up from 122. Her sugars are under better control with the the insulin on.  Patient's urine sodium was somewhat elevated from and on concerned the patient may have adrenal cortical insufficiency and I am going to order a cosyntropin test for the morning.  MRI of her head was unrevealing and she did have possibly some sinus congestion are going to go ahead and start Afrin nasal spray and see if that helps her headaches and have her use an ice pack and massage her head and neck.      Current Facility-Administered Medications:  apixaban (ELIQUIS) tablet 2.5 mg Oral 2x/day   aspirin (ECOTRIN) enteric coated tablet 81 mg 81 mg Oral Daily   atorvastatin (LIPITOR) tablet 10 mg Oral QPM   [START ON 10/27/2018] cosyntropin (CORTROSYN) 0.25 mg/5 mL IV injection 0.25 mg Intravenous Once   dextrose 50% (0.5 g/mL) injection - syringe 12.5 g Intravenous Q15 Min PRN   hydrALAZINE (APRESOLINE) tablet 10 mg Oral 2x/day   HYDROcodone-acetaminophen (NORCO) 5-325 mg per tablet 1 Tab Oral Q4H PRN   hydrocortisone-pramoxine (PROCTOFOAM HC) 1-1% rectal foam 1 Applicator Rectal 2x/day PRN   insulin glargine (LANTUS) 100 units/mL injection 10 Units Subcutaneous NIGHTLY   ketorolac (TORADOL) 30 mg/mL injection 30 mg Intravenous Once   loratadine (CLARITIN) tablet 10 mg Oral Daily   LORazepam (ATIVAN) tablet 0.5 mg Oral Daily   losartan (COZAAR) tablet 50 mg Oral Daily   magnesium hydroxide (MILK OF MAGNESIA) 400mg  per 79mL oral liquid 15 mL Oral Daily PRN   metoprolol succinate (TOPROL-XL) 24 hr extended release tablet 25 mg Oral Daily   montelukast (SINGULAIR) 10 mg tablet 10 mg  Oral QPM   NS flush syringe 10 mL Intracatheter Q8HRS   NS flush syringe 10 mL Intracatheter Q1H PRN   nystatin (NYSTOP) 100,000 units/g topical powder 1 Applicator Apply Topically 3x/day   ondansetron (ZOFRAN ODT) rapid dissolve tablet 4 mg Oral Q6H PRN   ondansetron (ZOFRAN) 2 mg/mL injection 4 mg Intravenous Q6H PRN   pancreatic enzyme replacement (CREON) 12,000 units lipase per cap 2 Cap Oral 3x/day-Meals   pantoprazole (PROTONIX) delayed release tablet 40 mg Oral Daily   SSIP insulin lispro (HUMALOG) 100 units/mL injection 0-12 Units Subcutaneous 4x/day PRN        Allergies   Allergen Reactions   . Sulfa (Sulfonamides) Itching             Filed Vitals:    10/26/18 0500 10/26/18 0729 10/26/18 1200 10/26/18 1500   BP: 121/70 109/87 (!) 131/57 (!) 126/54   Pulse: 67 42 77 74   Resp: 18 16 18 16    Temp: 37.1 C (98.7 F) 36.4 C (97.6 F) 36.3 C (97.3 F) 36.8 C (98.2 F)   SpO2:           Physical Exam   Constitutional: She is oriented to person, place, and time.   Pale and chronically ill appearing   HENT:   Head: Normocephalic and atraumatic.   Right Ear: External ear normal.   Left Ear: External ear normal.  Mouth/Throat: Oropharynx is clear and moist.   Eyes: Pupils are equal, round, and reactive to light. EOM are normal.   Neck: No tracheal deviation present. No thyromegaly present.   Cardiovascular: Normal rate, regular rhythm and normal heart sounds.   No murmur heard.  Pulmonary/Chest: Effort normal and breath sounds normal.   Abdominal: Soft. She exhibits no mass. There is no tenderness. There is no guarding.   Musculoskeletal: She exhibits no edema, tenderness or deformity.   Lymphadenopathy:     She has no cervical adenopathy.   Neurological: She is alert and oriented to person, place, and time. No cranial nerve deficit.   Skin: Skin is warm and dry. No rash noted.   Psychiatric: Affect and judgment normal.        Laboratory Data:     Results for orders placed or performed during the hospital  encounter of 10/22/18 (from the past 24 hour(s))   BASIC METABOLIC PANEL   Result Value Ref Range    SODIUM 126 (L) 137 - 145 mmol/L    POTASSIUM 4.2 3.5 - 5.1 mmol/L    CHLORIDE 102 98 - 107 mmol/L    CO2 TOTAL 20 (L) 22 - 30 mmol/L    ANION GAP 4 (L) 8 - 16 mmol/L    CALCIUM 7.5 (L) 8.4 - 10.2 mg/dL    GLUCOSE 73 (L) 74 - 106 mg/dL    BUN 16 7 - 17 mg/dL    CREATININE 0.90 0.52 - 1.04 mg/dL    BUN/CREA RATIO 18     ESTIMATED GFR >60 >60 mL/min/1.33m^2       Imaging Studies:    * MRI BRAIN WO CONTRAST   Final Result   Impression:      Questionable bilateral mastoiditis.      Negative for acute infarction.      No shift in midline structures. Negative for acute intracranial hemorrhage.   Tumor is not seen.            Radiologist location ID: [id number]             Assessment/Plan:  Hospital Problems    1Hypoglycemia associated with type 2 diabetes mellitus (CMS [place])         Date Noted: 10/22/2018      Headache         Date Noted: 10/24/2018      Hyponatremia         Date Noted: 10/24/2018      Hyperkalemia         Date Noted: 10/22/2018      Type 2 diabetes mellitus (CMS [facility])         Date Noted: 01/24/2017      Resolved Hospital Problems  No resolved problems to display.      ACTH stimulation test  Heplock IV  Ice pack to forehead  Diabetic education    Ignacia Felling, MD

## 2018-10-26 NOTE — Care Plan (Signed)
[facility]  Rehabilitation Services  Physical Therapy Progress Note    Patient Name: [name]  Date of Birth: [birth year]  Height:  152.4 cm (5')  Weight:  58.3 kg (128 lb 8 oz)  Room/Bed: 1134/A  Payor: MEDICARE / Plan: MEDICARE PART A AND B / Product Type: Medicare /     Assessment:     Patient is now able to demonstrate modified independence with bed-mobility and transfers. SBA x 1 with gait using a FWW. She tolerated 830 feet with one time sitting rest. Gait is slow and steady, no LOB observed. She performed strengthening exercises in supine resisting a green theraband and a 2# weight without any significant difficulty.    Discharge Needs:   Equipment Recommendation: none anticipated. She has her own walker at home      Discharge Disposition: home           Plan:   Continue to follow patient according to established plan of care.  The risks/benefits of therapy have been discussed with the patient/caregiver and he/she is in agreement with the established plan of care.     Subjective & Objective:   Initiated strengthening exercises today of all 4's in supine using a green theraband and a 2# load.     10/26/18 1400   Rehab Session   Document Type therapy progress note (daily note)   Total PT Minutes: 45   Patient Effort good   Symptoms Noted During/After Treatment none   General Information   Respiratory Status room air   Pre Treatment Status   Pre Treatment Patient Status Patient supine in bed   Gait Assessment/Treatment   Independence  modified independence;stand-by assistance   Assistive Device  walker, four wheeled   Distance in Feet 415 x 2   Total Distance Ambulated 830   Post Treatment Status   Post Treatment Patient Status Patient supine in bed   Support Present Post Treatment  None   Bed Mobility Goal   Bed Mobility Goal, Outcome Achieved goal met   Gait Training  Goal, Distance to Achieve   Gait Training  Goal, Date Goal Reviewed 10/26/18   Gait Training Goal, Outcome goal  ongoing   Strength Goal   Strength Goal, Date Goal Review 10/26/18   Strength Goal, Outcome goal partially met   Transfer Training Goal   Transfer Training Goal, Date Reviewed 10/26/18   Transfer Training Goal, Outcome goal met       Therapist:   Jessie Foot, PT

## 2018-10-27 LAB — BASIC METABOLIC PANEL
ANION GAP: 7 mmol/L — ABNORMAL LOW (ref 8–16)
BUN/CREA RATIO: 16
BUN: 14 mg/dL (ref 7–17)
CALCIUM: 8.1 mg/dL — ABNORMAL LOW (ref 8.4–10.2)
CHLORIDE: 103 mmol/L (ref 98–107)
CHLORIDE: 103 mmol/L (ref 98–107)
CO2 TOTAL: 18 mmol/L — ABNORMAL LOW (ref 22–30)
CREATININE: 0.9 mg/dL (ref 0.52–1.04)
ESTIMATED GFR: >60 mL/min/1.73mˆ2 (ref >60)
GLUCOSE: 143 mg/dL — ABNORMAL HIGH (ref 74–106)
POTASSIUM: 4.5 mmol/L (ref 3.5–5.1)
SODIUM: 128 mmol/L — ABNORMAL LOW (ref 137–145)

## 2018-10-27 LAB — CORTISOL, PLASMA OR SERUM: CORTISOL: 13.2 ug/dL

## 2018-10-27 MED ORDER — INSULIN GLARGINE 100 UNITS/ML SUBQ - CHARGE BY DOSE
8.00 [IU] | Freq: Every evening | SUBCUTANEOUS | Status: DC
Start: 2018-10-27 — End: 2018-10-30
  Administered 2018-10-27 – 2018-10-29 (×3): 0 [IU] via SUBCUTANEOUS

## 2018-10-27 MED ORDER — AMOXICILLIN 250 MG CAPSULE
500.00 mg | ORAL_CAPSULE | Freq: Three times a day (TID) | ORAL | Status: DC
Start: 2018-10-27 — End: 2018-10-30
  Administered 2018-10-27 – 2018-10-29 (×8): 500 mg via ORAL
  Filled 2018-10-27 (×8): qty 2

## 2018-10-27 MED ORDER — COSYNTROPIN 0.25 MG SOLUTION FOR INJECTION
0.2500 mg | Freq: Once | INTRAMUSCULAR | Status: AC
Start: 2018-10-28 — End: 2018-10-28
  Administered 2018-10-28: 0.25 mg via INTRAVENOUS
  Filled 2018-10-27: qty 5

## 2018-10-27 MED ADMIN — MULTIMODAL PERIARTICULAR INJECTION - TOT VOL 100 ML: INTRAVENOUS | @ 06:00:00 | NDC 63323028630

## 2018-10-27 MED ADMIN — potassium chloride 2 mEq/mL intravenous solution: NASAL | @ 21:00:00

## 2018-10-27 MED ADMIN — docusate sodium 50 mg/5 mL oral liquid: ORAL | @ 12:00:00

## 2018-10-27 MED ADMIN — sodium chloride 0.9 % (flush) injection syringe: NASAL | @ 09:00:00

## 2018-10-27 NOTE — Care Plan (Signed)
Problem: Adult Inpatient Plan of Care  Goal: Plan of Care Review  10/27/2018 1644 by Dalia Heading, RN  Outcome: Ongoing (see interventions/notes)  10/27/2018 1452 by Dalia Heading, RN  Outcome: Ongoing (see interventions/notes)  Goal: Patient-Specific Goal (Individualization)  10/27/2018 1644 by Dalia Heading, RN  Outcome: Ongoing (see interventions/notes)  10/27/2018 1452 by Dalia Heading, RN  Outcome: Ongoing (see interventions/notes)  Goal: Absence of Hospital-Acquired Illness or Injury  10/27/2018 1644 by Dalia Heading, RN  Outcome: Ongoing (see interventions/notes)  10/27/2018 1452 by Dalia Heading, RN  Outcome: Ongoing (see interventions/notes)  Goal: Optimal Comfort and Wellbeing  10/27/2018 1644 by Dalia Heading, RN  Outcome: Ongoing (see interventions/notes)  10/27/2018 1452 by Dalia Heading, RN  Outcome: Ongoing (see interventions/notes)     Problem: Fall Injury Risk  Goal: Absence of Fall and Fall-Related Injury  10/27/2018 1644 by Dalia Heading, RN  Outcome: Ongoing (see interventions/notes)  10/27/2018 1452 by Dalia Heading, RN  Outcome: Ongoing (see interventions/notes)     Problem: Glycemic Control Impaired  Goal: Blood Glucose Level Within Desired Range  10/27/2018 1644 by Dalia Heading, RN  Outcome: Ongoing (see interventions/notes)  10/27/2018 1452 by Dalia Heading, RN  Outcome: Ongoing (see interventions/notes)

## 2018-10-27 NOTE — Nurses Notes (Signed)
Went in to check patients BS @ [phone] glucometer read 31 patient was alert and responsive and able to eat and drink gave patient 2 orange juices waited 15 minutes rechecked BS 62 attempted to cal Dr B Vista Lawman no answer. Waited 15 more minutes gave patient crackers and peanut butter rechecked BS was 106. Attempted to call Dr. Alcide Evener again no answer.

## 2018-10-27 NOTE — Nurses Notes (Signed)
Patient ambulating in room with walker. Patients accucheck done 155. Patient refused coverage at this time. Patient denies any needs at this time. Will continue to monitor. Call light within reach. Dalia Heading, RN  10/27/2018, 17:07

## 2018-10-27 NOTE — Progress Notes (Signed)
[facility]  Family Medicine  Progress Note    Date of Service:  10/27/2018  [name], [name], 72 y.o. female  Date of Admission:  10/22/2018  Date of Birth:  03-23-1946  PCP: Ignacia Felling, MD    HPI:  Pt. Had a low glucose last night/early morning in the 30's with decreased mental status ass.   Pt. Has been eating ok - still has headaches.  I had ordered an ACTH stimulation test for this am but labs not drawn at right times and I will reorder it for tomorrow.  Her sodium is up to 128.  Pt. Is not stable in my opinion for discharge.      apixaban (ELIQUIS) tablet, 2.5 mg, Oral, 2x/day  aspirin (ECOTRIN) enteric coated tablet 81 mg, 81 mg, Oral, Daily  atorvastatin (LIPITOR) tablet, 10 mg, Oral, QPM  [START ON 10/28/2018] cosyntropin (CORTROSYN) 0.25 mg/5 mL IV injection, 0.25 mg, Intravenous, Once  dextrose 50% (0.5 g/mL) injection - syringe, 12.5 g, Intravenous, Q15 Min PRN  hydrALAZINE (APRESOLINE) tablet, 10 mg, Oral, 2x/day  HYDROcodone-acetaminophen (NORCO) 5-325 mg per tablet, 1 Tab, Oral, Q4H PRN  hydrocortisone-pramoxine (PROCTOFOAM HC) 1-1% rectal foam, 1 Applicator, Rectal, 2x/day PRN  insulin glargine (LANTUS) 100 units/mL injection, 8 Units, Subcutaneous, NIGHTLY  loratadine (CLARITIN) tablet, 10 mg, Oral, Daily  LORazepam (ATIVAN) tablet, 0.5 mg, Oral, Daily  losartan (COZAAR) tablet, 50 mg, Oral, Daily  magnesium hydroxide (MILK OF MAGNESIA) 400mg  per 1mL oral liquid, 15 mL, Oral, Daily PRN  metoprolol succinate (TOPROL-XL) 24 hr extended release tablet, 25 mg, Oral, Daily  montelukast (SINGULAIR) 10 mg tablet, 10 mg, Oral, QPM  NS flush syringe, 10 mL, Intracatheter, Q8HRS  NS flush syringe, 10 mL, Intracatheter, Q1H PRN  nystatin (NYSTOP) 100,000 units/g topical powder, 1 Applicator, Apply Topically, 3x/day  ondansetron (ZOFRAN ODT) rapid dissolve tablet, 4 mg, Oral, Q6H PRN  ondansetron (ZOFRAN) 2 mg/mL injection, 4 mg, Intravenous, Q6H PRN  oxymetazoline (AFRIN) 0.05%  nasal spray, 1 Spray, Each Nostril, 2x/day  pancreatic enzyme replacement (CREON) 12,000 units lipase per cap, 2 Cap, Oral, 3x/day-Meals  pantoprazole (PROTONIX) delayed release tablet, 40 mg, Oral, Daily  SSIP insulin lispro (HUMALOG) 100 units/mL injection, 0-12 Units, Subcutaneous, 4x/day PRN         Allergies   Allergen Reactions   . Sulfa (Sulfonamides) Itching             Filed Vitals:    10/26/18 2349 10/27/18 0438 10/27/18 0726 10/27/18 0823   BP: (!) 116/53 (!) 141/82 (!) 142/97    Pulse: 46 48 70    Resp: 18 20 18     Temp: 36.5 C (97.7 F) 36.4 C (97.6 F) 36.1 C (97 F)    SpO2:    98%       Physical Exam   Constitutional: She is oriented to person, place, and time.   Pale and chronically ill appearing   HENT:   Head: Normocephalic and atraumatic.   Right Ear: External ear normal.   Left Ear: External ear normal.   Eyes: EOM are normal.   Neck: No tracheal deviation present. No thyromegaly present.   Cardiovascular: Normal rate, regular rhythm and normal heart sounds.   No murmur heard.  Pulmonary/Chest: Effort normal and breath sounds normal.   Abdominal: Soft. She exhibits no mass. There is no abdominal tenderness. There is no guarding.   Musculoskeletal:         General: No tenderness, deformity or edema.   Lymphadenopathy:  She has no cervical adenopathy.   Neurological: She is alert and oriented to person, place, and time. No cranial nerve deficit. Gait normal.   Skin: Skin is warm and dry. No rash noted.   Psychiatric: Affect and judgment normal.        Laboratory Data:     Results for orders placed or performed during the hospital encounter of 10/22/18 (from the past 24 hour(s))   BASIC METABOLIC PANEL   Result Value Ref Range    SODIUM 128 (L) 137 - 145 mmol/L    POTASSIUM 4.5 3.5 - 5.1 mmol/L    CHLORIDE 103 98 - 107 mmol/L    CO2 TOTAL 18 (L) 22 - 30 mmol/L    ANION GAP 7 (L) 8 - 16 mmol/L    CALCIUM 8.1 (L) 8.4 - 10.2 mg/dL    GLUCOSE 143 (H) 74 - 106 mg/dL    BUN 14 7 - 17 mg/dL     CREATININE 0.90 0.52 - 1.04 mg/dL    BUN/CREA RATIO 16     ESTIMATED GFR >60 >60 mL/min/1.74m^2       Imaging Studies:    * MRI BRAIN WO CONTRAST   Final Result   Impression:      Questionable bilateral mastoiditis.      Negative for acute infarction.      No shift in midline structures. Negative for acute intracranial hemorrhage.   Tumor is not seen.            Radiologist location ID: [id number]             Assessment/Plan:  Hospital Problems    1Hypoglycemia associated with type 2 diabetes mellitus (CMS [place])         Date Noted: 10/22/2018      Headache         Date Noted: 10/24/2018      Hyponatremia         Date Noted: 10/24/2018      Hyperkalemia         Date Noted: 10/22/2018      Type 2 diabetes mellitus (CMS [facility])         Date Noted: 01/24/2017      Resolved Hospital Problems  No resolved problems to display.    ACTH stimulation test reordered  Decrease lantus to 8 units from 10 units  Add amoxicillin for possible mastoiditis    Ignacia Felling, MD

## 2018-10-27 NOTE — Nurses Notes (Signed)
Patients blood sugar 190

## 2018-10-28 LAB — CBC
HCT: 27.9 % — ABNORMAL LOW (ref 34.8–46.0)
HGB: 9.3 g/dL — ABNORMAL LOW (ref 11.5–16.0)
MCH: 30.4 pg (ref 26.0–32.0)
MCHC: 33.3 g/dL (ref 31.0–35.5)
MCHC: 33.3 g/dL (ref 31.0–35.5)
MCV: 91.2 fL (ref 78.0–100.0)
PLATELETS: 86 x10ˆ3/uL — ABNORMAL LOW (ref 150–400)
RBC: 3.06 x10ˆ6/uL — ABNORMAL LOW (ref 3.85–5.22)
RDW-CV: 13.2 % (ref 11.5–15.5)
WBC: 2.8 x10ˆ3/uL — ABNORMAL LOW (ref 3.7–11.0)

## 2018-10-28 LAB — COMPREHENSIVE METABOLIC PANEL, NON-FASTING
ALBUMIN: 2.3 g/dL — ABNORMAL LOW (ref 3.5–5.0)
ALKALINE PHOSPHATASE: 50 U/L (ref 38–126)
ALKALINE PHOSPHATASE: 50 U/L (ref 38–126)
ALT (SGPT): 14 U/L (ref 0–34)
ANION GAP: 5 mmol/L — ABNORMAL LOW (ref 8–16)
AST (SGOT): 22 U/L (ref 14–36)
AST (SGOT): 22 U/L (ref 14–36)
BILIRUBIN TOTAL: 0.5 mg/dL (ref 0.2–1.3)
BUN/CREA RATIO: 14
BUN: 10 mg/dL (ref 7–17)
CALCIUM: 8.2 mg/dL — ABNORMAL LOW (ref 8.4–10.2)
CHLORIDE: 101 mmol/L (ref 98–107)
CO2 TOTAL: 19 mmol/L — ABNORMAL LOW (ref 22–30)
CREATININE: 0.7 mg/dL (ref 0.52–1.04)
CREATININE: 0.7 mg/dL (ref 0.52–1.04)
ESTIMATED GFR: >60 mL/min/1.73mˆ2 (ref >60)
GLUCOSE: 101 mg/dL (ref 74–106)
POTASSIUM: 4.3 mmol/L (ref 3.5–5.1)
POTASSIUM: 4.3 mmol/L (ref 3.5–5.1)
PROTEIN TOTAL: 4.5 g/dL — ABNORMAL LOW (ref 6.3–8.2)
PROTEIN TOTAL: 4.5 g/dL — ABNORMAL LOW (ref 6.3–8.2)
SODIUM: 125 mmol/L — ABNORMAL LOW (ref 137–145)

## 2018-10-28 LAB — RED TOP TUBE

## 2018-10-28 LAB — THYROID STIMULATING HORMONE (SENSITIVE TSH): TSH: 2.15 u[IU]/mL (ref 0.470–4.680)

## 2018-10-28 LAB — SODIUM, RANDOM URINE: SODIUM RANDOM URINE: 44 mmol/L (ref 30–90)

## 2018-10-28 MED ORDER — SODIUM CHLORIDE 1 GRAM TABLET
1.00 g | ORAL_TABLET | Freq: Three times a day (TID) | ORAL | Status: DC
Start: 2018-10-28 — End: 2018-10-30
  Administered 2018-10-28 – 2018-10-29 (×5): 1 g via ORAL
  Filled 2018-10-28 (×5): qty 1

## 2018-10-28 MED ADMIN — amoxicillin 250 mg capsule: ORAL | @ 10:00:00

## 2018-10-28 MED ADMIN — albumin, human 25 % intravenous solution: ORAL | @ 09:00:00 | NDC 00944049301

## 2018-10-28 MED ADMIN — GENTAMICIN IVPB ALTERNATIVE DOSING: ORAL | @ 09:00:00

## 2018-10-28 MED ADMIN — rOPINIRole 0.5 mg tablet: ORAL | @ 08:00:00

## 2018-10-28 NOTE — Progress Notes (Signed)
[facility]  Family Medicine  Progress Note    Date of Service:  10/28/2018  [name], [name], 72 y.o. female  Date of Admission:  10/22/2018  Date of Birth:  1946/09/13  PCP: Ignacia Felling, MD    HPI:  Is fatigued and tired today and headache is not as bad.  A.m. Cortisols is normal.  Waiting for the full on ACTH stimulation test results.  Sodium is down to 125 from 01/28 on.  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  I am going to add sodium chloride tablets.  She is not drinking that much fluids as I do not feel and knee to fluid restrict her at this time.  Will recheck labs in the morning and if stable will discharge home tomorrow.    amoxicillin (AMOXIL) capsule, 500 mg, Oral, 3x/day  apixaban (ELIQUIS) tablet, 2.5 mg, Oral, 2x/day  aspirin (ECOTRIN) enteric coated tablet 81 mg, 81 mg, Oral, Daily  atorvastatin (LIPITOR) tablet, 10 mg, Oral, QPM  dextrose 50% (0.5 g/mL) injection - syringe, 12.5 g, Intravenous, Q15 Min PRN  hydrALAZINE (APRESOLINE) tablet, 10 mg, Oral, 2x/day  HYDROcodone-acetaminophen (NORCO) 5-325 mg per tablet, 1 Tab, Oral, Q4H PRN  hydrocortisone-pramoxine (PROCTOFOAM HC) 1-1% rectal foam, 1 Applicator, Rectal, 2x/day PRN  insulin glargine (LANTUS) 100 units/mL injection, 8 Units, Subcutaneous, NIGHTLY  loratadine (CLARITIN) tablet, 10 mg, Oral, Daily  LORazepam (ATIVAN) tablet, 0.5 mg, Oral, Daily  losartan (COZAAR) tablet, 50 mg, Oral, Daily  magnesium hydroxide (MILK OF MAGNESIA) 400mg  per 50mL oral liquid, 15 mL, Oral, Daily PRN  metoprolol succinate (TOPROL-XL) 24 hr extended release tablet, 25 mg, Oral, Daily  montelukast (SINGULAIR) 10 mg tablet, 10 mg, Oral, QPM  NS flush syringe, 10 mL, Intracatheter, Q8HRS  NS flush syringe, 10 mL, Intracatheter, Q1H PRN  nystatin (NYSTOP) 100,000 units/g topical powder, 1 Applicator, Apply Topically, 3x/day  ondansetron (ZOFRAN ODT) rapid dissolve tablet, 4 mg, Oral, Q6H PRN  ondansetron (ZOFRAN) 2 mg/mL  injection, 4 mg, Intravenous, Q6H PRN  oxymetazoline (AFRIN) 0.05% nasal spray, 1 Spray, Each Nostril, 2x/day  pancreatic enzyme replacement (CREON) 12,000 units lipase per cap, 2 Cap, Oral, 3x/day-Meals  pantoprazole (PROTONIX) delayed release tablet, 40 mg, Oral, Daily  sodium chloride tablet, 1 g, Oral, 3x/day-Meals  SSIP insulin lispro (HUMALOG) 100 units/mL injection, 0-12 Units, Subcutaneous, 4x/day PRN         Allergies   Allergen Reactions   . Sulfa (Sulfonamides) Itching             Filed Vitals:    10/27/18 2342 10/28/18 0355 10/28/18 0739 10/28/18 1132   BP: (!) 120/45 136/89 (!) 147/88 (!) 130/59   Pulse: 77 48 78 72   Resp: 18 18 16 16    Temp: 36.7 C (98.1 F) 36.3 C (97.4 F) 36.8 C (98.3 F) 36.9 C (98.5 F)   SpO2:           Physical Exam   Constitutional: She is oriented to person, place, and time and well-developed, well-nourished, and in no distress.   Pale and chronically ill appearing in NAD   HENT:   Head: Normocephalic and atraumatic.   Right Ear: External ear normal.   Left Ear: External ear normal.   Mouth/Throat: Oropharynx is clear and moist.   Eyes: Pupils are equal, round, and reactive to light. EOM are normal.   Neck: No tracheal deviation present. No thyromegaly present.   Cardiovascular: Normal rate, regular rhythm and normal heart sounds.   No  murmur heard.  Pulmonary/Chest: Effort normal and breath sounds normal.   Abdominal: Soft. She exhibits no mass. There is no abdominal tenderness. There is no guarding.   Musculoskeletal:         General: No tenderness, deformity or edema.   Lymphadenopathy:     She has no cervical adenopathy.   Neurological: She is alert and oriented to person, place, and time. No cranial nerve deficit. Gait normal.   Skin: Skin is warm and dry. No rash noted.   Psychiatric: Affect and judgment normal.       Laboratory Data:     Results for orders placed or performed during the hospital encounter of 10/22/18 (from the past 24 hour(s))   CBC   Result Value  Ref Range    WBC 2.8 (L) 3.7 - 11.0 x10^3/uL    RBC 3.06 (L) 3.85 - 5.22 x10^6/uL    HGB 9.3 (L) 11.5 - 16.0 g/dL    HCT 27.9 (L) 34.8 - 46.0 %    MCV 91.2 78.0 - 100.0 fL    MCH 30.4 26.0 - 32.0 pg    MCHC 33.3 31.0 - 35.5 g/dL    RDW-CV 13.2 11.5 - 15.5 %    PLATELETS 86 (L) 150 - 400 x10^3/uL   COMPREHENSIVE METABOLIC PANEL, NON-FASTING   Result Value Ref Range    SODIUM 125 (L) 137 - 145 mmol/L    POTASSIUM 4.3 3.5 - 5.1 mmol/L    CHLORIDE 101 98 - 107 mmol/L    CO2 TOTAL 19 (L) 22 - 30 mmol/L    ANION GAP 5 (L) 8 - 16 mmol/L    BUN 10 7 - 17 mg/dL    CREATININE 0.70 0.52 - 1.04 mg/dL    BUN/CREA RATIO 14     ESTIMATED GFR >60 >60 mL/min/1.72m^2    ALBUMIN 2.3 (L) 3.5 - 5.0 g/dL    CALCIUM 8.2 (L) 8.4 - 10.2 mg/dL    GLUCOSE 101 74 - 106 mg/dL    ALKALINE PHOSPHATASE 50 38 - 126 U/L    ALT (SGPT) 14 0 - 34 U/L    AST (SGOT) 22 14 - 36 U/L    BILIRUBIN TOTAL 0.5 0.2 - 1.3 mg/dL    PROTEIN TOTAL 4.5 (L) 6.3 - 8.2 g/dL       Imaging Studies:    * MRI BRAIN WO CONTRAST   Final Result   Impression:      Questionable bilateral mastoiditis.      Negative for acute infarction.      No shift in midline structures. Negative for acute intracranial hemorrhage.   Tumor is not seen.            Radiologist location ID: [id number]             Assessment/Plan:  Hospital Problems    1Hypoglycemia associated with type 2 diabetes mellitus (CMS [place])         Date Noted: 10/22/2018      Headache         Date Noted: 10/24/2018      Hyponatremia         Date Noted: 10/24/2018      Hyperkalemia         Date Noted: 10/22/2018      Type 2 diabetes mellitus (CMS [facility])         Date Noted: 01/24/2017      Resolved Hospital Problems  No resolved problems to display.  Add sodium chloride tablets  Await cortisyn stim test results  Urine sodium and osmolality pending  Home soon    Ignacia Felling, MD

## 2018-10-28 NOTE — Nurses Notes (Signed)
Patient refuses any insulin coverage offered all shift. States she is "afraid" it will drop her glucose. Information and education provided on glucose monitoring and insulin coverage. Verbalizes understanding.

## 2018-10-28 NOTE — Care Plan (Signed)
Patient will maintain blood glucose level WDL this shift.

## 2018-10-29 LAB — COMPREHENSIVE METABOLIC PANEL, NON-FASTING
ALBUMIN: 2.1 g/dL — ABNORMAL LOW (ref 3.5–5.0)
ALBUMIN: 2.1 g/dL — ABNORMAL LOW (ref 3.5–5.0)
ALKALINE PHOSPHATASE: 50 U/L (ref 38–126)
ALT (SGPT): 14 U/L (ref 0–34)
ANION GAP: 7 mmol/L — ABNORMAL LOW (ref 8–16)
ANION GAP: 7 mmol/L — ABNORMAL LOW (ref 8–16)
AST (SGOT): 21 U/L (ref 14–36)
BILIRUBIN TOTAL: 0.4 mg/dL (ref 0.2–1.3)
BUN/CREA RATIO: 14
BUN: 10 mg/dL (ref 7–17)
CALCIUM: 8.1 mg/dL — ABNORMAL LOW (ref 8.4–10.2)
CHLORIDE: 102 mmol/L (ref 98–107)
CHLORIDE: 102 mmol/L (ref 98–107)
CO2 TOTAL: 19 mmol/L — ABNORMAL LOW (ref 22–30)
CREATININE: 0.7 mg/dL (ref 0.52–1.04)
ESTIMATED GFR: >60 mL/min/1.73mˆ2 (ref >60)
GLUCOSE: 100 mg/dL (ref 74–106)
POTASSIUM: 4.1 mmol/L (ref 3.5–5.1)
PROTEIN TOTAL: 4.1 g/dL — ABNORMAL LOW (ref 6.3–8.2)
SODIUM: 128 mmol/L — ABNORMAL LOW (ref 137–145)

## 2018-10-29 LAB — ACTH STIMULATION, 30 MIN CORTISOL: ACTH STIM CORTISOL 30 MIN: 17.2 ug/dL

## 2018-10-29 LAB — ACTH STIMULATION, 0 MIN CORTISOL: ACTH STIM CORTISOL 0 MIN: 14.6 ug/dL

## 2018-10-29 LAB — OSMOLALITY, RANDOM URINE: OSMOLALITY URINE: 185 mosm/kg (ref 50–1400)

## 2018-10-29 MED ORDER — INSULIN ASPART (U-100) 100 UNIT/ML (3 ML) SUBCUTANEOUS PEN
5.00 [IU] | PEN_INJECTOR | Freq: Two times a day (BID) | SUBCUTANEOUS | 11 refills | Status: DC
Start: 2018-10-29 — End: 2019-10-11

## 2018-10-29 MED ORDER — INSULIN GLARGINE (U-100) 100 UNIT/ML (3 ML) SUBCUTANEOUS PEN: 8 [IU] | Each | Freq: Every evening | SUBCUTANEOUS | 11 refills | 0 days | Status: AC

## 2018-10-29 MED ORDER — SODIUM CHLORIDE 1 GRAM TABLET: 1 g | Tab | Freq: Three times a day (TID) | ORAL | 2 refills | 0 days | Status: AC

## 2018-10-29 MED ADMIN — nystatin 100,000 unit/gram topical powder: TOPICAL | @ 14:00:00

## 2018-10-29 MED ADMIN — montelukast 10 mg tablet: ORAL | @ 21:00:00

## 2018-10-29 MED ADMIN — raNITIdine 15 mg/mL oral syrup: ORAL | @ 17:00:00

## 2018-10-29 MED ADMIN — lactated Ringers intravenous solution: TOPICAL | @ 22:00:00 | NDC 00264775000

## 2018-10-29 MED ADMIN — PANTOPRAZOLE 80MG IN NS 100ML CONTINUOUS INFUSION: TOPICAL | @ 08:00:00 | NDC 00008400101

## 2018-10-29 MED ADMIN — NICOTINE INHALER MOUTHPIECE: ORAL | @ 06:00:00 | NDC 09991000822

## 2018-10-29 NOTE — Discharge Instructions (Signed)
Follow up with Dr 11/25 as scheduled.  Return as needed.

## 2018-10-29 NOTE — Nurses Notes (Signed)
Patient refused insulin coverage all shift. States she does not want to take it at all. Educated on insulin, continues to refuse.

## 2018-10-29 NOTE — Care Plan (Signed)
Patient will not have hypoglycemic episode this shift.

## 2018-10-29 NOTE — Discharge Summary (Signed)
DISCHARGE SUMMARY      PATIENT NAME:  [name], [name]  MRN:  [id number]  DOB:  March 19, 1946    INPATIENT ADMISSION DATE: 10/22/2018  DISCHARGE DATE:  10/29/2018    ATTENDING PHYSICIAN: Ignacia Felling, MD  SERVICE: [facility]  PRIMARY CARE PHYSICIAN: Ignacia Felling, MD     Reason for Admission     Diagnosis    Hypoglycemia associated with type 2 diabetes mellitus (CMS [facility]) [2947654]    Hypoglycemia associated with type 2 diabetes mellitus (CMS [facility] [[id number]]          DISCHARGE DIAGNOSIS:     Active Hospital Problems    Diagnosis Date Noted   . Headache [R51] 10/24/2018   . Hyponatremia [E87.1] 10/24/2018   . Type 2 diabetes mellitus (CMS [place]) [E11.9] 01/24/2017      Resolved Hospital Problems    Diagnosis    . Principle Problem: Hypoglycemia associated with type 2 diabetes mellitus (CMS [place]) [E11.649]    . Hyperkalemia [E87.5]              [place] COURSE:  This is a 72 y.o., female Who was initially admitted due to symptomatic hypoglycemia and noted to be on a sulfonylurea.  Pt. With a history of a whipple surgery for pancreatic cancer a couple of years ago.  She has a poor appetite and has been chronically ill but no obvious recurrence of cancer noted.   I took her off her metformin and gyburide and pt.'s sugars stayed high.  I started her on lantus insulin and she had one low sugar in the early am - and I adjusted her lantus from 10 to 8 units.  Pt. Had problems with hyponatremia down to 122 and had ass. Headache and weakness - MRI did not show any acute problems.  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  I did DC some of her medications that could have contributed to her low sodium and her sodium was 128 on day of discharge.      Filed Vitals:    10/29/18 1143 10/29/18 1607 10/29/18 1900 10/29/18 2000   BP: 120/80 (!) 162/58 (!) 140/49    Pulse: 72 79 85    Resp: '16 16 18 18   ' Temp: 37.4 C (99.4 F) 37 C (98.6 F) 36.3 C (97.3 F)    SpO2:     96%       PHYSICAL EXAM:   Physical Exam   Constitutional: She is well-developed, well-nourished, and in no distress.   Pale and chronically ill appearing   HENT:   Head: Normocephalic and atraumatic.   Eyes: EOM are normal.   Cardiovascular: Normal rate and regular rhythm.   Pulmonary/Chest: Effort normal and breath sounds normal.   Abdominal: Soft. There is no abdominal tenderness.   Skin: Skin is warm and dry.        Results for orders placed or performed during the hospital encounter of 10/22/18 (from the past 168 hour(s))   BASIC METABOLIC PANEL   Result Value Ref Range    SODIUM 129 (L) 137 - 145 mmol/L    POTASSIUM 5.7 (H) 3.5 - 5.1 mmol/L    CHLORIDE 101 98 - 107 mmol/L    CO2 TOTAL 22 22 - 30 mmol/L    ANION GAP 6 (L) 8 - 16 mmol/L    CALCIUM 8.6 8.4 - 10.2 mg/dL    GLUCOSE 84 74 - 106 mg/dL  BUN 30 (H) 7 - 17 mg/dL    CREATININE 1.00 0.52 - 1.04 mg/dL    BUN/CREA RATIO 30     ESTIMATED GFR 57 (L) >60 mL/min/1.73m2    Narrative    Estimated Glomerular Filtration Rate (eGFR) calculated using the CKD-EPI (2009) equation, intended for patients 137years of age and older. If race and/or gender is not documented or "unknown," there will be no eGFR calculation.   LAVENDER TOP TUBE   Result Value Ref Range    RAINBOW/EXTRA TUBE AUTO RESULT Yes    BASIC METABOLIC PANEL, FASTING   Result Value Ref Range    SODIUM 125 (L) 137 - 145 mmol/L    POTASSIUM 5.0 3.5 - 5.1 mmol/L    CHLORIDE 98 98 - 107 mmol/L    CO2 TOTAL 22 22 - 30 mmol/L    ANION GAP 5 (L) 8 - 16 mmol/L    CALCIUM 8.5 8.4 - 10.2 mg/dL    GLUCOSE 147 (H) 74 - 106 mg/dL    BUN 24 (H) 7 - 17 mg/dL    CREATININE 1.00 0.52 - 1.04 mg/dL    BUN/CREA RATIO 24     ESTIMATED GFR 57 (L) >60 mL/min/1.790m    Narrative    Estimated Glomerular Filtration Rate (eGFR) calculated using the CKD-EPI (2009) equation, intended for patients 1815ears of age and older. If race and/or gender is not documented or "unknown," there will be no eGFR calculation.   URINALYSIS,  MACROSCOPIC AND MICROSCOPIC    Narrative    The following orders were created for panel order URINALYSIS, MACROSCOPIC AND MICROSCOPIC.  Procedure                               Abnormality         Status                     ---------                               -----------         ------                     URINALYSIS, MACROSCOPIC[[id number]]      Abnormal            Final result               URINALYSIS, MICROSCOPIC[[id number]]      Abnormal            Final result                 Please view results for these tests on the individual orders.   URINALYSIS, MACROSCOPIC   Result Value Ref Range    COLOR Yellow Yellow, Straw    APPEARANCE Clear Clear, Slightly Hazy    SPECIFIC GRAVITY 1.010 1.005 - 1.030    PH 5.5 5.0 - 8.5    LEUKOCYTES Negative Negative WBCs/uL    NITRITE Negative Negative    PROTEIN Negative Negative mg/dL    GLUCOSE 250  (A) Negative mg/dL    KETONES Negative Negative mg/dL    UROBILINOGEN 0.2  0.2 mg/dL    BILIRUBIN Negative Negative mg/dL    BLOOD Negative Negative mg/dL   URINALYSIS, MICROSCOPIC   Result Value Ref Range    RBCS 6-10 (A) None, Occasional,  0-2, 3-5 /hpf    WBCS None None, Occasional, 0-2, 3-5 /hpf    BACTERIA Rare (A) None /hpf    SQUAMOUS EPITHELIAL 16-20 (A) None, Occasional, 0-2, 3-5 /hpf    MUCOUS Few (A) None /hpf   TROPONIN-I   Result Value Ref Range    TROPONIN I <0.01 0.00 - 0.03 ng/mL   CREATINE KINASE (CK), MB FRACTION   Result Value Ref Range    CK-MB 0.5 0.0 - 3.4 ng/mL   COMPREHENSIVE METABOLIC PANEL, NON-FASTING   Result Value Ref Range    SODIUM 122 (L) 137 - 145 mmol/L    POTASSIUM 4.6 3.5 - 5.1 mmol/L    CHLORIDE 98 98 - 107 mmol/L    CO2 TOTAL 20 (L) 22 - 30 mmol/L    ANION GAP 4 (L) 8 - 16 mmol/L    BUN 19 (H) 7 - 17 mg/dL    CREATININE 0.90 0.52 - 1.04 mg/dL    BUN/CREA RATIO 21     ESTIMATED GFR >60 >60 mL/min/1.65m2    ALBUMIN 2.1 (L) 3.5 - 5.0 g/dL    CALCIUM 7.8 (L) 8.4 - 10.2 mg/dL    GLUCOSE 167 (H) 74 - 106 mg/dL    ALKALINE PHOSPHATASE 47 38 - 126 U/L       ALT (SGPT) 11 0 - 34 U/L    AST (SGOT) 20 14 - 36 U/L    BILIRUBIN TOTAL 0.5 0.2 - 1.3 mg/dL    PROTEIN TOTAL 4.1 (L) 6.3 - 8.2 g/dL    Narrative    Estimated Glomerular Filtration Rate (eGFR) calculated using the CKD-EPI (2009) equation, intended for patients 161years of age and older. If race and/or gender is not documented or "unknown," there will be no eGFR calculation.   SODIUM, RANDOM URINE   Result Value Ref Range    SODIUM RANDOM URINE 44 30 - 90 mmol/L   BASIC METABOLIC PANEL   Result Value Ref Range    SODIUM 126 (L) 137 - 145 mmol/L    POTASSIUM 4.2 3.5 - 5.1 mmol/L    CHLORIDE 102 98 - 107 mmol/L    CO2 TOTAL 20 (L) 22 - 30 mmol/L    ANION GAP 4 (L) 8 - 16 mmol/L    CALCIUM 7.5 (L) 8.4 - 10.2 mg/dL    GLUCOSE 73 (L) 74 - 106 mg/dL    BUN 16 7 - 17 mg/dL    CREATININE 0.90 0.52 - 1.04 mg/dL    BUN/CREA RATIO 18     ESTIMATED GFR >60 >60 mL/min/1.741m    Narrative    Estimated Glomerular Filtration Rate (eGFR) calculated using the CKD-EPI (2009) equation, intended for patients 1852ears of age and older. If race and/or gender is not documented or "unknown," there will be no eGFR calculation.   BASIC METABOLIC PANEL   Result Value Ref Range    SODIUM 128 (L) 137 - 145 mmol/L    POTASSIUM 4.5 3.5 - 5.1 mmol/L    CHLORIDE 103 98 - 107 mmol/L    CO2 TOTAL 18 (L) 22 - 30 mmol/L    ANION GAP 7 (L) 8 - 16 mmol/L    CALCIUM 8.1 (L) 8.4 - 10.2 mg/dL    GLUCOSE 143 (H) 74 - 106 mg/dL    BUN 14 7 - 17 mg/dL    CREATININE 0.90 0.52 - 1.04 mg/dL    BUN/CREA RATIO 16     ESTIMATED GFR >60 >60 mL/min/1.734m   Narrative  Estimated Glomerular Filtration Rate (eGFR) calculated using the CKD-EPI (2009) equation, intended for patients 59 years of age and older. If race and/or gender is not documented or "unknown," there will be no eGFR calculation.   CORTISOL, PLASMA OR SERUM   Result Value Ref Range    CORTISOL 13.2 ug/dL   CBC   Result Value Ref Range    WBC 2.8 (L) 3.7 - 11.0 x10^3/uL    RBC 3.06 (L) 3.85 - 5.22  x10^6/uL    HGB 9.3 (L) 11.5 - 16.0 g/dL    HCT 27.9 (L) 34.8 - 46.0 %    MCV 91.2 78.0 - 100.0 fL    MCH 30.4 26.0 - 32.0 pg    MCHC 33.3 31.0 - 35.5 g/dL    RDW-CV 13.2 11.5 - 15.5 %    PLATELETS 86 (L) 150 - 400 x10^3/uL   COMPREHENSIVE METABOLIC PANEL, NON-FASTING   Result Value Ref Range    SODIUM 125 (L) 137 - 145 mmol/L    POTASSIUM 4.3 3.5 - 5.1 mmol/L    CHLORIDE 101 98 - 107 mmol/L    CO2 TOTAL 19 (L) 22 - 30 mmol/L    ANION GAP 5 (L) 8 - 16 mmol/L    BUN 10 7 - 17 mg/dL    CREATININE 0.70 0.52 - 1.04 mg/dL    BUN/CREA RATIO 14     ESTIMATED GFR >60 >60 mL/min/1.40m2    ALBUMIN 2.3 (L) 3.5 - 5.0 g/dL    CALCIUM 8.2 (L) 8.4 - 10.2 mg/dL    GLUCOSE 101 74 - 106 mg/dL    ALKALINE PHOSPHATASE 50 38 - 126 U/L    ALT (SGPT) 14 0 - 34 U/L    AST (SGOT) 22 14 - 36 U/L    BILIRUBIN TOTAL 0.5 0.2 - 1.3 mg/dL    PROTEIN TOTAL 4.5 (L) 6.3 - 8.2 g/dL    Narrative    Estimated Glomerular Filtration Rate (eGFR) calculated using the CKD-EPI (2009) equation, intended for patients 160years of age and older. If race and/or gender is not documented or "unknown," there will be no eGFR calculation.   EXTRA TUBES    Narrative    The following orders were created for panel order EXTRA TUBES.  Procedure                               Abnormality         Status                     ---------                               -----------         ------                     RED TOP TFYTW[[id number]]                                    Final result                 Please view results for these tests on the individual orders.   RED TOP TUBE   Result Value Ref Range    RAINBOW/EXTRA TUBE AUTO RESULT Yes    THYROID  STIMULATING HORMONE (SENSITIVE TSH)   Result Value Ref Range    TSH 2.150 0.470 - 4.680 uIU/mL   COMPREHENSIVE METABOLIC PANEL, NON-FASTING   Result Value Ref Range    SODIUM 128 (L) 137 - 145 mmol/L    POTASSIUM 4.1 3.5 - 5.1 mmol/L    CHLORIDE 102 98 - 107 mmol/L    CO2 TOTAL 19 (L) 22 - 30 mmol/L    ANION GAP 7 (L) 8 - 16 mmol/L     BUN 10 7 - 17 mg/dL    CREATININE 0.70 0.52 - 1.04 mg/dL    BUN/CREA RATIO 14     ESTIMATED GFR >60 >60 mL/min/1.60m2    ALBUMIN 2.1 (L) 3.5 - 5.0 g/dL    CALCIUM 8.1 (L) 8.4 - 10.2 mg/dL    GLUCOSE 100 74 - 106 mg/dL    ALKALINE PHOSPHATASE 50 38 - 126 U/L    ALT (SGPT) 14 0 - 34 U/L    AST (SGOT) 21 14 - 36 U/L    BILIRUBIN TOTAL 0.4 0.2 - 1.3 mg/dL    PROTEIN TOTAL 4.1 (L) 6.3 - 8.2 g/dL    Narrative    Estimated Glomerular Filtration Rate (eGFR) calculated using the CKD-EPI (2009) equation, intended for patients 116years of age and older. If race and/or gender is not documented or "unknown," there will be no eGFR calculation.         DISCHARGE INSTRUCTIONS:    No discharge procedures on file.       DISCHARGE MEDICATIONS:     Current Discharge Medication List      START taking these medications.      Details   insulin aspart U-100 100 unit/mL (3 mL) Insulin Pen  Commonly known as:  NovoLOG Flexpen U-100 Insulin   5 Units, Subcutaneous, 2 TIMES DAILY BEFORE MEALS  Qty:  1 Each  Refills:  11     insulin glargine 100 unit/mL Insulin Pen  Commonly known as:  LANTUS SOLOSTAR U-100 INSULIN   8 Units, Subcutaneous, NIGHTLY  Qty:  1 Each  Refills:  11     sodium chloride 1 gram Tablet  Start taking on:  October 30, 2018   1 g, Oral, 3 TIMES DAILY WITH MEALS  Qty:  90 Tab  Refills:  2        CONTINUE these medications which have CHANGED during your visit.      Details   hydrALAZINE 10 mg Tablet  Commonly known as:  APRESOLINE  What changed:  when to take this   10 mg, Oral, EVERY 8 HOURS (SCHEDULED)  Qty:  90 Tab  Refills:  0     hydrocortisone-pramoxine 1-1 % Foam  Commonly known as:  PROCTOFOAM HC  What changed:     when to take this   reasons to take this   1 Applicator, Rectal, EVERY 6 HOURS  Qty:  10 g  Refills:  0        CONTINUE these medications - NO CHANGES were made during your visit.      Details   apixaban 2.5 mg Tablet  Commonly known as:  ELIQUIS   2.5 mg, Oral, 2 TIMES DAILY  Qty:  60 Tab  Refills:  1      aspirin 81 mg Tablet, Delayed Release (E.C.)  Commonly known as:  ECOTRIN   81 mg, Oral  Refills:  0     atorvastatin 10 mg Tablet  Commonly known as:  LIPITOR  10 mg, Oral  Refills:  0     cetirizine 10 mg Tablet  Commonly known as:  ZYRTEC   10 mg, Oral  Refills:  0     furosemide 20 mg Tablet  Commonly known as:  LASIX   40 mg, Oral, DAILY  Qty:  60 Tab  Refills:  0     HYDROcodone-acetaminophen 5-325 mg Tablet  Commonly known as:  NORCO   1 Tab, Oral, EVERY 4 HOURS PRN  Refills:  0     LORazepam 0.5 mg Tablet  Commonly known as:  ATIVAN   0.5 mg, DAILY  Refills:  0     losartan 100 mg Tablet  Commonly known as:  COZAAR   100 mg, Oral, DAILY  Refills:  0     metoprolol succinate 50 mg Tablet Sustained Release 24 hr  Commonly known as:  TOPROL-XL   25 mg, Oral, DAILY  Refills:  0     montelukast 10 mg Tablet  Commonly known as:  SINGULAIR   10 mg, Oral  Refills:  0     omeprazole 40 mg Capsule, Delayed Release(E.C.)  Commonly known as:  PRILOSEC   40 mg, Oral, DAILY  Qty:  90 Cap  Refills:  4     ondansetron 4 mg Tablet  Commonly known as:  ZOFRAN   4 mg, Oral, EVERY 6 HOURS PRN  Refills:  0     pancreatic enzyme replacement 12,000-38,000 -60,000 unit Capsule, Delayed Release(E.C.)  Commonly known as:  CREON   Oral, 3 TIMES DAILY WITH MEALS, 2 CAPS WITH MEALS AND 1 CAP WITH EACH SNACK   Refills:  0        STOP taking these medications.    citalopram 20 mg Tablet  Commonly known as:  CELEXA     MetFORMIN 1,000 mg Tablet  Commonly known as:  GLUCOPHAGE     nystatin 100,000 unit/gram Powder  Commonly known as:  NYSTOP     spironolactone 50 mg Tablet  Commonly known as:  ALDACTONE              DISCHARGE DISPOSITION:  Home to self and family's care  F/U with me in 1 week      Ignacia Felling, MD      Copies sent to Care Team       Relationship Specialty Notifications Start End    Ignacia Felling, MD PCP - General FAMILY MEDICINE  10/22/18     Phone: [phone] Fax: [phone]         [place]  [place] [address]

## 2018-10-29 NOTE — Nurses Notes (Signed)
Patient sister in to visit. Updated on condition. Awaiting dr. Ignacia Felling to see patient.

## 2018-10-29 NOTE — Care Plan (Signed)
[facility]  Physical Therapy     Patient Name: [name]  Date of Birth: May 13, 1946  Room/Bed: 1134/A      Subjective: Pt short sitting on bed visiting family. She stated she would like to go for walk.      Objective :      10/29/18 1610   Gait Assessment/Treatment   Independence  modified independence;stand-by assistance   Assistive Device  walker, front wheeled   Distance in Feet 1x830 ft   Total Distance Ambulated 830         Assessment: Pt amb from her room to [facility] door and back to her room without any rest breaks.                        Hr 93 -->95 bpm ,  O2 sats 95% --> 93% . No c/o any discomfort after treatment.    Plan: Cont PT POC. Therapeutic exercises and Gait training next session.         (pt sched for bathing as soon as we returned to her room)    Weyman Pedro, PTA

## 2018-10-29 NOTE — Nurses Notes (Signed)
Patient discharged per Dr Jacinto Reap [place].  Saline lock dced with cath intact, pressure dressing applied.

## 2018-10-30 ENCOUNTER — Telehealth (HOSPITAL_COMMUNITY): Payer: Self-pay

## 2018-10-30 NOTE — Nursing Note (Signed)
ToC Management    Date: 10/30/18  Time: 1500  Contact made via: Phone  Spoke with: Patient  Call Outcome: Completed  Reviewed Med Chngs/Adds/Dele: Yes  Adverse Effects: No  Pt/Caregiver aware Reason for Hosp: Yes, reinforced  Pt/Caregiver Aware Reasons Causing Return to [facility]: Yes, reinforced  Experiencing acute symptoms: No  Reviewed PCP Follow-up Appt: Appt scheduled  Appt Scheduled: Plans to attend, has transportation  Ordered labs, tests, imaging, etc reviewed w/pt?: N/A  Reviewed Post-Acute Agency/Service Orders: Yes  Agency/Service at Discharge: [place] ([facility] by Agency/Service: Yes, contact info reviewed

## 2018-11-07 ENCOUNTER — Telehealth (HOSPITAL_COMMUNITY): Payer: Self-pay

## 2018-11-07 NOTE — Nursing Note (Signed)
11/07/2018 1250--Unable to reach patient for follow up at this time.  No answer.

## 2018-11-20 ENCOUNTER — Telehealth (HOSPITAL_COMMUNITY): Payer: Self-pay

## 2018-11-20 NOTE — Nursing Note (Signed)
11/20/2018 1528--Follow up call completed at this time.  Patient doing well. Continues with home health thru [facility].  No new complaints.

## 2018-11-27 ENCOUNTER — Ambulatory Visit (INDEPENDENT_AMBULATORY_CARE_PROVIDER_SITE_OTHER): Payer: Medicare Other | Admitting: Family

## 2018-11-27 ENCOUNTER — Encounter (INDEPENDENT_AMBULATORY_CARE_PROVIDER_SITE_OTHER): Payer: Self-pay | Admitting: Vascular & Interventional Radiology

## 2018-11-27 VITALS — BP 116/68 | HR 54 | Ht 60.0 in | Wt 144.0 lb

## 2018-11-27 DIAGNOSIS — Z86711 Personal history of pulmonary embolism: Secondary | ICD-10-CM

## 2018-11-27 DIAGNOSIS — Z86718 Personal history of other venous thrombosis and embolism: Secondary | ICD-10-CM

## 2018-11-27 DIAGNOSIS — I872 Venous insufficiency (chronic) (peripheral): Secondary | ICD-10-CM

## 2018-11-28 ENCOUNTER — Telehealth (HOSPITAL_COMMUNITY): Payer: Self-pay

## 2018-11-28 NOTE — Nursing Note (Signed)
11/28/2018 1515--Follow up call completed at this time.  Patient doing well.  No complaints or concerns.  Continues with home health services.

## 2018-12-11 ENCOUNTER — Ambulatory Visit (HOSPITAL_BASED_OUTPATIENT_CLINIC_OR_DEPARTMENT_OTHER): Payer: Self-pay | Admitting: Gastroenterology

## 2018-12-21 NOTE — Procedures (Cosign Needed)
Pt seen on 11/27/2018. Refer to progress note.       [name] Kierstead, MA

## 2019-01-02 ENCOUNTER — Encounter (INDEPENDENT_AMBULATORY_CARE_PROVIDER_SITE_OTHER): Payer: Self-pay | Admitting: Family

## 2019-01-02 NOTE — H&P (Cosign Needed)
[facility]  [address]  [place] [address]  Phone: [phone]  Fax: [phone]      Encounter Date: 11/27/2018    Patient ID:  [name]  UVO:[id number]    DOB: Apr 15, 1946  Age: 73 y.o. female    Subjective:     Chief Complaint   Patient presents with   . Hospital Follow Up     rm1; pt is here for 2 wk hospital f/u; hx of pe and dvt       HPI  [name] is a 73 y.o. female who was seen today for evaluation and treatment recommendations for history of deep vein thrombosis and pulmonary embolism. She had most recent ultrasound in April which showed age indeterminate left PT vein occlusive DVT.  She reports intermittent leg edema. Has history of cancer. Has been on Eliquis 2.5 mg BID and is tolerating well.    Current Outpatient Medications   Medication Sig   . apixaban (ELIQUIS) 2.5 mg Oral Tablet Take 1 Tab (2.5 mg total) by mouth Twice daily   . aspirin (ECOTRIN) 81 mg Oral Tablet, Delayed Release (E.C.) Take 81 mg by mouth   . atorvastatin (LIPITOR) 10 mg Oral Tablet Take 10 mg by mouth   . cetirizine (ZYRTEC) 10 mg Oral Tablet Take 10 mg by mouth   . furosemide (LASIX) 20 mg Oral Tablet Take 2 Tabs (40 mg total) by mouth Once a day   . hydrALAZINE (APRESOLINE) 10 mg Oral Tablet Take 1 Tab (10 mg total) by mouth Every 8 hours (Patient taking differently: Take 10 mg by mouth Twice daily )   . HYDROcodone-acetaminophen (NORCO) 5-325 mg Oral Tablet Take 1 Tab by mouth Every 4 hours as needed for Pain   . hydrocortisone-pramoxine (PROCTOFOAM HC) 1-1 % Rectal Foam 1 Applicator by Rectal route Every 6 hours (Patient taking differently: 1 Applicator by Rectal route Twice per day as needed )   . insulin aspart U-100 (NOVOLOG FLEXPEN U-100 INSULIN) 100 unit/mL (3 mL) Subcutaneous Insulin Pen 5 Units by Subcutaneous route Twice a day before meals   . insulin glargine (LANTUS SOLOSTAR U-100 INSULIN) 100 unit/mL Subcutaneous Insulin Pen 8 Units by Subcutaneous  route Every night   . LORazepam (ATIVAN) 0.5 mg Oral Tablet 0.5 mg Once a day    . losartan (COZAAR) 100 mg Oral Tablet Take 100 mg by mouth Once a day    . metoprolol succinate (TOPROL-XL) 50 mg Oral Tablet Sustained Release 24 hr Take 0.5 Tabs (25 mg total) by mouth Once a day   . montelukast (SINGULAIR) 10 mg Oral Tablet Take 10 mg by mouth   . omeprazole (PRILOSEC) 40 mg Oral Capsule, Delayed Release(E.C.) Take 1 Cap (40 mg total) by mouth Once a day   . ondansetron (ZOFRAN) 4 mg Oral Tablet Take 4 mg by mouth Every 6 hours as needed for nausea/vomiting   . pancreatic enzyme replacement (CREON) 12,000-38,000 -60,000 unit Oral Capsule, Delayed Release(E.C.) Take by mouth Three times daily with meals 2 CAPS WITH MEALS AND 1 CAP WITH EACH SNACK   . sodium chloride 1 gram Oral Tablet Take 1 Tab (1 g total) by mouth Three times daily with meals     Allergies   Allergen Reactions   . Sulfa (Sulfonamides) Itching       Past Medical History:   Diagnosis Date   . Abdominal hernia 10/18/2017    hx of repair   . Anxiety    .  Arthritis    . Asthma    . Atrial fibrillation (CMS [facility])    . Back problem    . Bruises easily    . Cancer (CMS [place]) 10/18/2017    chemo and radiation completed 09/24/2017   . COPD (chronic obstructive pulmonary disease) (CMS [facility])    . CPAP (continuous positive airway pressure) dependence 10/18/2017    has not used recently   . Depression    . DM (diabetes mellitus) (CMS [facility]) 2000    Fasting BG 200's. HGA1C 2018 6   . Edema    . Essential hypertension    . GERD (gastroesophageal reflux disease)    . Headache    . Heartburn    . Hx antineoplastic chemo 2018   . Hx of radiation therapy 2018   . Hypercholesteremia    . Hyperlipidemia    . Migraine 10/18/2017    none recently   . Obesity    . Palpitations    . Pancreatic cancer (CMS [place]) 03/2017   . Panic attack    . PE (pulmonary thromboembolism) (CMS [facility]) 03/29/2018   . Peripheral neuropathy 10/18/2017    knees down, bilateral   . Sleep apnea    . Type 2  diabetes mellitus (CMS [facility]) 10/18/2017    Dx 2000 FBS 200s   . Unintentional weight loss 10/18/2017    25 lbs 03/2017   . Wears glasses          Past Surgical History:   Procedure Laterality Date   . CESAREAN SECTION     . HX HERNIA REPAIR     . HX SUBCLAVIAN PORT IMPLANTION      s/p removed   . HX SUBCLAVIAN PORT IMPLANTION     . HX TONSIL AND ADENOIDECTOMY     . HX TONSILLECTOMY     . UMBILICAL HERNIA REPAIR      x2         Family Medical History:     Problem Relation (Age of Onset)    Diabetes Mother    Heart Disease Mother            Social History     Tobacco Use   . Smoking status: Former Smoker     Packs/day: 0.50     Years: 20.00     Pack years: 10.00     Last attempt to quit: 10/18/1993     Years since quitting: 25.2   . Smokeless tobacco: Never Used   Substance Use Topics   . Alcohol use: No   . Drug use: No       Review of  Systems:     Constitutional Negative for: Fever, Chills, Weight Loss, Weakness     Skin Negative for: Rash, Itching  HENT Positive for: Headaches  HENT Negative for: Nosebleeds  Eyes Positive for: Blurred Vision, Double Vision     Cardiovascular Positive for: Chest Pain, Leg Swelling, Claudication        Respiratory Negative for: Cough, Shortness of Breath, Wheezing  Gastrointestinal Positive for: Abdominal Pain(has hernia on left of belly button)  Gastrointestinal Negative for: Blood in Stool     Genitourinary Negative for: Hematuria     Musculoskeletal Negative for: Myalgias, Joint Pain, Falls  Endo/Heme/Allergy Positive for: Easy Bruise/Bleed     Neurological Positive for: Dizziness  Neurological Negative for : Tingling, Tremor, Focal Weakness, Seizures, LOC  Psychiatric Positive for: Depression, Memory Loss  Psychological Negative for: Nervoucs/Anxious, Substance Abuse, Suicidal Ideas, Insomnia  Objective:   Vitals: BP 116/68   Pulse 54   Ht 1.524 m (5')   Wt 65.3 kg (144 lb)   SpO2 98%   BMI 28.12 kg/m         General Exam:    Constitutional: she is  oriented to person, place, and time. she appears well-developed and well-nourished. No distress.   Cardiovascular: Normal rate, regular rhythm, normal heart sounds and intact distal pulses.  mild bilateral edema. Non-pitting  Pulmonary/Chest: Effort normal and breath sounds normal. No respiratory distress. she has no wheezes. she has no rales.   Abdominal: Soft. Bowel sounds are normal.   Musculoskeletal: Normal range of motion.   Neurological: she is alert and oriented to person, place, and time. No cranial nerve deficit.   Skin: Skin is warm and dry. she is not diaphoretic.   Vitals reviewed.        Ancillary Tests Reviewed:  Bilateral venous duplex ultrasound on 04/05/18        ENCOUNTER DIAGNOSES     ICD-10-CM   1. Venous insufficiency I87.2   2. History of DVT in adulthood Z86.718   3. History of pulmonary embolism Z86.711       Assessment     chronic venous insufficiency  Preliminary ultrasound report shows moderate bilateral greater saphenous vein reflux. Moderate deep vein reflux on the right. Low probability of deep vein thrombosis within limits of the exam.          Plan     Bilateral venous reflux ultrasound in office today  History of deep vein thrombosis   History of pulmonary embolism   Continue medical management with Eliquis.  Recommend compression garments with routine leg elevation and exercise as tolerated      Orders Placed This Encounter   . VENOUS DUPLEX EXTREMITY/BILATERAL/COMPLETE (AMB ONLY)       Return in about 1 year (around 11/28/2019).    Lenzi Marmo, APRN,FNP-BC    This note was partially generated using MModal Fluency Direct system, and there may be some incorrect words, spellings, and punctuation that were not noted in checking the note before saving.

## 2019-01-16 ENCOUNTER — Other Ambulatory Visit (HOSPITAL_COMMUNITY): Payer: Self-pay | Admitting: Interventional Cardiology

## 2019-01-16 DIAGNOSIS — I509 Heart failure, unspecified: Secondary | ICD-10-CM

## 2019-01-16 DIAGNOSIS — R609 Edema, unspecified: Secondary | ICD-10-CM

## 2019-01-16 DIAGNOSIS — R0602 Shortness of breath: Secondary | ICD-10-CM

## 2019-01-24 ENCOUNTER — Encounter (HOSPITAL_COMMUNITY): Payer: Self-pay

## 2019-01-25 ENCOUNTER — Ambulatory Visit
Admission: RE | Admit: 2019-01-25 | Discharge: 2019-01-25 | Disposition: A | Discharge status: Home / Self Care | Payer: Medicare Other | Source: Ambulatory Visit | Attending: Interventional Cardiology | Admitting: Interventional Cardiology

## 2019-01-25 ENCOUNTER — Other Ambulatory Visit: Payer: Self-pay

## 2019-01-25 DIAGNOSIS — R0602 Shortness of breath: Secondary | ICD-10-CM | POA: Insufficient documentation

## 2019-01-25 DIAGNOSIS — R609 Edema, unspecified: Secondary | ICD-10-CM

## 2019-01-25 DIAGNOSIS — I509 Heart failure, unspecified: Secondary | ICD-10-CM | POA: Insufficient documentation

## 2019-02-01 ENCOUNTER — Other Ambulatory Visit (INDEPENDENT_AMBULATORY_CARE_PROVIDER_SITE_OTHER): Payer: Self-pay | Admitting: Family Medicine

## 2019-02-01 DIAGNOSIS — C259 Malignant neoplasm of pancreas, unspecified: Secondary | ICD-10-CM

## 2019-02-13 ENCOUNTER — Other Ambulatory Visit (INDEPENDENT_AMBULATORY_CARE_PROVIDER_SITE_OTHER): Payer: Self-pay | Admitting: Family Medicine

## 2019-02-13 ENCOUNTER — Ambulatory Visit
Admission: RE | Admit: 2019-02-13 | Discharge: 2019-02-13 | Disposition: A | Discharge status: Home / Self Care | Payer: Medicare Other | Source: Ambulatory Visit | Attending: Family Medicine | Admitting: Family Medicine

## 2019-02-13 ENCOUNTER — Other Ambulatory Visit: Payer: Self-pay

## 2019-02-13 DIAGNOSIS — Z452 Encounter for adjustment and management of vascular access device: Secondary | ICD-10-CM | POA: Insufficient documentation

## 2019-02-13 DIAGNOSIS — C259 Malignant neoplasm of pancreas, unspecified: Secondary | ICD-10-CM

## 2019-02-13 MED ORDER — SODIUM CHLORIDE 0.9 % (FLUSH) INJECTION SYRINGE
10.00 mL | INJECTION | Freq: Three times a day (TID) | INTRAMUSCULAR | Status: DC
Start: 2019-02-13 — End: 2019-02-14
  Administered 2019-02-13: 20 mL via INTRAVENOUS

## 2019-02-13 MED ORDER — HEPARIN, PORCINE (PF) 100 UNIT/ML INTRAVENOUS SYRINGE
5.0000 mL | INJECTION | Freq: Once | INTRAVENOUS | Status: AC
Start: 2019-02-13 — End: 2019-02-13
  Administered 2019-02-13: 5 mL
  Filled 2019-02-13: qty 5

## 2019-02-13 NOTE — Nurses Notes (Signed)
Patient ambulatory into OPN today accompanied by caretaker for her port flush. Site to right upper chest cleansed with chloraprep and allowed to dry, access with 19g power pac needle, flushed with NS 65ml, good blood return, then flushed with Heparin 30ml. Patient with no allergy to heparin. Needle removed and dressing applied, band aid to site. No bleeding noted. Patient ambulatory out of OPN to vehicle. Patient will call for appointment for next month. [name]

## 2019-03-15 ENCOUNTER — Encounter (INDEPENDENT_AMBULATORY_CARE_PROVIDER_SITE_OTHER): Payer: Self-pay

## 2019-04-13 ENCOUNTER — Other Ambulatory Visit: Payer: Self-pay

## 2019-04-13 ENCOUNTER — Emergency Department (HOSPITAL_COMMUNITY): Payer: Medicare Other

## 2019-04-13 ENCOUNTER — Inpatient Hospital Stay
Admission: EM | Admit: 2019-04-13 | Discharge: 2019-04-15 | DRG: 872 | Disposition: A | Discharge status: Other Acute Inpatient Hospital | Payer: Medicare Other | Attending: FAMILY PRACTICE | Admitting: FAMILY PRACTICE

## 2019-04-13 ENCOUNTER — Inpatient Hospital Stay (HOSPITAL_COMMUNITY): Payer: Medicare Other | Admitting: FAMILY PRACTICE

## 2019-04-13 ENCOUNTER — Encounter (HOSPITAL_COMMUNITY): Payer: Self-pay

## 2019-04-13 DIAGNOSIS — J449 Chronic obstructive pulmonary disease, unspecified: Secondary | ICD-10-CM | POA: Diagnosis present

## 2019-04-13 DIAGNOSIS — I4891 Unspecified atrial fibrillation: Secondary | ICD-10-CM | POA: Diagnosis present

## 2019-04-13 DIAGNOSIS — Z794 Long term (current) use of insulin: Secondary | ICD-10-CM

## 2019-04-13 DIAGNOSIS — R519 Headache, unspecified: Secondary | ICD-10-CM

## 2019-04-13 DIAGNOSIS — Z20822 Contact with and (suspected) exposure to covid-19: Secondary | ICD-10-CM

## 2019-04-13 DIAGNOSIS — D696 Thrombocytopenia, unspecified: Secondary | ICD-10-CM | POA: Diagnosis present

## 2019-04-13 DIAGNOSIS — R51 Headache: Secondary | ICD-10-CM | POA: Insufficient documentation

## 2019-04-13 DIAGNOSIS — E222 Syndrome of inappropriate secretion of antidiuretic hormone: Secondary | ICD-10-CM | POA: Diagnosis present

## 2019-04-13 DIAGNOSIS — N3000 Acute cystitis without hematuria: Secondary | ICD-10-CM | POA: Insufficient documentation

## 2019-04-13 DIAGNOSIS — Z79890 Hormone replacement therapy (postmenopausal): Secondary | ICD-10-CM

## 2019-04-13 DIAGNOSIS — Z8507 Personal history of malignant neoplasm of pancreas: Secondary | ICD-10-CM

## 2019-04-13 DIAGNOSIS — E785 Hyperlipidemia, unspecified: Secondary | ICD-10-CM | POA: Diagnosis present

## 2019-04-13 DIAGNOSIS — R404 Transient alteration of awareness: Secondary | ICD-10-CM

## 2019-04-13 DIAGNOSIS — F410 Panic disorder [episodic paroxysmal anxiety] without agoraphobia: Secondary | ICD-10-CM | POA: Diagnosis present

## 2019-04-13 DIAGNOSIS — Z7982 Long term (current) use of aspirin: Secondary | ICD-10-CM

## 2019-04-13 DIAGNOSIS — Z7901 Long term (current) use of anticoagulants: Secondary | ICD-10-CM

## 2019-04-13 DIAGNOSIS — K219 Gastro-esophageal reflux disease without esophagitis: Secondary | ICD-10-CM | POA: Insufficient documentation

## 2019-04-13 DIAGNOSIS — F329 Major depressive disorder, single episode, unspecified: Secondary | ICD-10-CM | POA: Diagnosis present

## 2019-04-13 DIAGNOSIS — E119 Type 2 diabetes mellitus without complications: Secondary | ICD-10-CM | POA: Diagnosis present

## 2019-04-13 DIAGNOSIS — R918 Other nonspecific abnormal finding of lung field: Secondary | ICD-10-CM | POA: Diagnosis present

## 2019-04-13 DIAGNOSIS — I10 Essential (primary) hypertension: Secondary | ICD-10-CM | POA: Insufficient documentation

## 2019-04-13 DIAGNOSIS — E7800 Pure hypercholesterolemia, unspecified: Secondary | ICD-10-CM | POA: Diagnosis present

## 2019-04-13 DIAGNOSIS — I110 Hypertensive heart disease with heart failure: Secondary | ICD-10-CM | POA: Diagnosis present

## 2019-04-13 DIAGNOSIS — Z66 Do not resuscitate: Secondary | ICD-10-CM | POA: Diagnosis present

## 2019-04-13 DIAGNOSIS — N390 Urinary tract infection, site not specified: Secondary | ICD-10-CM | POA: Diagnosis present

## 2019-04-13 DIAGNOSIS — Z86711 Personal history of pulmonary embolism: Secondary | ICD-10-CM

## 2019-04-13 DIAGNOSIS — G629 Polyneuropathy, unspecified: Secondary | ICD-10-CM | POA: Diagnosis present

## 2019-04-13 DIAGNOSIS — Z882 Allergy status to sulfonamides status: Secondary | ICD-10-CM

## 2019-04-13 DIAGNOSIS — I509 Heart failure, unspecified: Secondary | ICD-10-CM | POA: Diagnosis present

## 2019-04-13 DIAGNOSIS — Z79891 Long term (current) use of opiate analgesic: Secondary | ICD-10-CM

## 2019-04-13 DIAGNOSIS — Z923 Personal history of irradiation: Secondary | ICD-10-CM

## 2019-04-13 DIAGNOSIS — Z87891 Personal history of nicotine dependence: Secondary | ICD-10-CM

## 2019-04-13 DIAGNOSIS — Z79899 Other long term (current) drug therapy: Secondary | ICD-10-CM

## 2019-04-13 DIAGNOSIS — F419 Anxiety disorder, unspecified: Secondary | ICD-10-CM | POA: Insufficient documentation

## 2019-04-13 DIAGNOSIS — D649 Anemia, unspecified: Secondary | ICD-10-CM | POA: Diagnosis present

## 2019-04-13 DIAGNOSIS — A419 Sepsis, unspecified organism: Principal | ICD-10-CM | POA: Diagnosis present

## 2019-04-13 DIAGNOSIS — Z20828 Contact with and (suspected) exposure to other viral communicable diseases: Secondary | ICD-10-CM

## 2019-04-13 LAB — URINALYSIS, MACROSCOPIC
BILIRUBIN: NEGATIVE mg/dL
GLUCOSE: NEGATIVE mg/dL
KETONES: NEGATIVE mg/dL
LEUKOCYTES: NEGATIVE WBCs/uL
NITRITE: POSITIVE — AB
PH: 5.5 (ref 5.0–8.5)
SPECIFIC GRAVITY: 1.02 (ref 1.005–1.030)

## 2019-04-13 LAB — COMPREHENSIVE METABOLIC PANEL, NON-FASTING
ALBUMIN: 3.4 g/dL — ABNORMAL LOW (ref 3.5–5.0)
ALKALINE PHOSPHATASE: 227 U/L — ABNORMAL HIGH (ref 38–126)
ALT (SGPT): 152 U/L — ABNORMAL HIGH (ref 0–34)
ANION GAP: 8 mmol/L (ref 8–16)
AST (SGOT): 332 U/L — ABNORMAL HIGH (ref 14–36)
BILIRUBIN TOTAL: 0.7 mg/dL (ref 0.2–1.3)
BUN/CREA RATIO: 26
BUN: 21 mg/dL — ABNORMAL HIGH (ref 7–17)
CALCIUM: 8.9 mg/dL (ref 8.4–10.2)
CHLORIDE: 98 mmol/L (ref 98–107)
CO2 TOTAL: 27 mmol/L (ref 22–30)
CREATININE: 0.8 mg/dL (ref 0.52–1.04)
ESTIMATED GFR: >60 mL/min/{1.73_m2} (ref >60)
GLUCOSE: 77 mg/dL (ref 74–106)
POTASSIUM: 3.5 mmol/L (ref 3.5–5.1)
PROTEIN TOTAL: 5.9 g/dL — ABNORMAL LOW (ref 6.3–8.2)
SODIUM: 133 mmol/L — ABNORMAL LOW (ref 137–145)

## 2019-04-13 LAB — CBC WITH DIFF
BASOPHIL #: <0.1 10*3/uL (ref <=0.20)
BASOPHIL %: 0 %
EOSINOPHIL #: <0.1 10*3/uL (ref <=0.50)
EOSINOPHIL %: 0 %
HCT: 32.2 % — ABNORMAL LOW (ref 34.8–46.0)
HGB: 10.4 g/dL — ABNORMAL LOW (ref 11.5–16.0)
IMMATURE GRANULOCYTE #: <0.1 10*3/uL (ref <0.10)
IMMATURE GRANULOCYTE %: 0 % (ref 0–1)
LYMPHOCYTE #: 0.51 10*3/uL — ABNORMAL LOW (ref 1.00–4.80)
LYMPHOCYTE %: 5 %
MCH: 29 pg (ref 26.0–32.0)
MCHC: 32.3 g/dL (ref 31.0–35.5)
MCV: 89.7 fL (ref 78.0–100.0)
MONOCYTE #: 0.65 10*3/uL (ref 0.20–1.10)
MONOCYTE %: 7 %
MPV: 11.9 fL (ref 8.7–12.5)
NEUTROPHIL #: 8.5 10*3/uL — ABNORMAL HIGH (ref 1.50–7.70)
NEUTROPHIL %: 88 %
PLATELETS: 100 10*3/uL — ABNORMAL LOW (ref 150–400)
RBC: 3.59 10*6/uL — ABNORMAL LOW (ref 3.85–5.22)
RDW-CV: 13.2 % (ref 11.5–15.5)
WBC: 9.8 10*3/uL (ref 3.7–11.0)

## 2019-04-13 LAB — ARTERIAL BLOOD GAS - MLS OP ONLY
%FIO2 (ARTERIAL): 28 %
BASE DEFICIT: 1.2 mmol/L (ref ?–2.0)
BICARBONATE (ARTERIAL): 21.3 mmol/L — ABNORMAL LOW (ref 24.0–26.0)
O2 SATURATION (ARTERIAL): 95.1 % (ref 88.0–100.0)
PCO2 (ARTERIAL): 28.7 mmHg — ABNORMAL LOW (ref 35.0–45.0)
PH (ARTERIAL): 7.49 — ABNORMAL HIGH (ref 7.35–7.45)
PO2 (ARTERIAL): 136.7 mmHg — ABNORMAL HIGH (ref 80.0–100.0)

## 2019-04-13 LAB — URINALYSIS, MICROSCOPIC

## 2019-04-13 LAB — ARTERIAL BLOOD GAS: RESPIRATORY RATE: 18

## 2019-04-13 LAB — LACTIC ACID LEVEL W/ REFLEX FOR LEVEL >2.0: LACTIC ACID: 1.2 mmol/L (ref 0.7–2.0)

## 2019-04-13 LAB — D-DIMER: D-DIMER: 0.99 mg/L — ABNORMAL HIGH (ref 0.00–0.59)

## 2019-04-13 LAB — TROPONIN-I: TROPONIN I: 0.04 ng/mL — ABNORMAL HIGH (ref 0.00–0.03)

## 2019-04-13 LAB — RAPID INFLUENZA A/B ANTIGEN
INFLUENZA A ANTIGEN: NEGATIVE
INFLUENZA B ANTIGEN: NEGATIVE

## 2019-04-13 LAB — RED TOP TUBE

## 2019-04-13 LAB — COVID-19 BDMAX - LAB USE ONLY: SARS-COV-2: NOT DETECTED

## 2019-04-13 MED ORDER — SODIUM CHLORIDE 0.9 % (FLUSH) INJECTION SYRINGE
10.0000 mL | INJECTION | Freq: Three times a day (TID) | INTRAMUSCULAR | Status: DC
Start: 2019-04-13 — End: 2019-04-15
  Administered 2019-04-13: 10 mL
  Administered 2019-04-13: 0 mL
  Administered 2019-04-14 (×2): 10 mL
  Administered 2019-04-14: 0 mL
  Administered 2019-04-15: 10 mL
  Administered 2019-04-15: 0 mL

## 2019-04-13 MED ORDER — CETIRIZINE 10 MG TABLET
10.0000 mg | ORAL_TABLET | Freq: Every day | ORAL | Status: DC
Start: 2019-04-13 — End: 2019-04-15
  Administered 2019-04-13 – 2019-04-14 (×2): 0 mg via ORAL
  Administered 2019-04-15: 10 mg via ORAL

## 2019-04-13 MED ORDER — HYDROCORTISONE 1 %-PRAMOXINE 1 % RECTAL FOAM
1.00 | Freq: Two times a day (BID) | RECTAL | Status: DC | PRN
Start: 2019-04-13 — End: 2019-04-15

## 2019-04-13 MED ORDER — PANTOPRAZOLE 40 MG TABLET,DELAYED RELEASE
40.00 mg | DELAYED_RELEASE_TABLET | Freq: Every day | ORAL | Status: DC
Start: 2019-04-13 — End: 2019-04-15
  Administered 2019-04-13 – 2019-04-15 (×3): 40 mg via ORAL
  Filled 2019-04-13 (×3): qty 1

## 2019-04-13 MED ORDER — HYDROCODONE 5 MG-ACETAMINOPHEN 325 MG TABLET
1.00 | ORAL_TABLET | ORAL | Status: DC | PRN
Start: 2019-04-13 — End: 2019-04-15

## 2019-04-13 MED ORDER — LOSARTAN 50 MG TABLET
100.0000 mg | ORAL_TABLET | Freq: Every day | ORAL | Status: DC
Start: 2019-04-13 — End: 2019-04-15
  Administered 2019-04-13: 0 mg via ORAL
  Administered 2019-04-14 – 2019-04-15 (×2): 100 mg via ORAL
  Filled 2019-04-13 (×2): qty 2

## 2019-04-13 MED ORDER — DEXTROSE 50 % IN WATER (D50W) INTRAVENOUS SYRINGE
12.50 g | INJECTION | INTRAVENOUS | Status: DC | PRN
Start: 2019-04-13 — End: 2019-04-15

## 2019-04-13 MED ORDER — ONDANSETRON 4 MG DISINTEGRATING TABLET
4.00 mg | ORAL_TABLET | Freq: Four times a day (QID) | ORAL | Status: DC | PRN
Start: 2019-04-13 — End: 2019-04-15

## 2019-04-13 MED ORDER — SODIUM CHLORIDE 1 GRAM TABLET
1.00 g | ORAL_TABLET | Freq: Three times a day (TID) | ORAL | Status: DC
Start: 2019-04-13 — End: 2019-04-15
  Administered 2019-04-13 – 2019-04-15 (×6): 1 g via ORAL
  Filled 2019-04-13 (×6): qty 1

## 2019-04-13 MED ORDER — SODIUM CHLORIDE 0.9 % IV BOLUS
1000.00 mL | INJECTION | Status: AC
Start: 2019-04-13 — End: 2019-04-13
  Administered 2019-04-13: 0 mL via INTRAVENOUS
  Administered 2019-04-13: 10:00:00 1000 mL via INTRAVENOUS

## 2019-04-13 MED ORDER — ACETAMINOPHEN 325 MG TABLET
650.0000 mg | ORAL_TABLET | ORAL | Status: DC | PRN
Start: 2019-04-13 — End: 2019-04-15
  Administered 2019-04-14: 650 mg via ORAL
  Filled 2019-04-13: qty 2

## 2019-04-13 MED ORDER — SODIUM CHLORIDE 0.9 % IV BOLUS
50.00 mL | INJECTION | Status: DC
Start: 2019-04-13 — End: 2019-04-15

## 2019-04-13 MED ORDER — APIXABAN 5 MG TABLET
2.50 mg | ORAL_TABLET | Freq: Two times a day (BID) | ORAL | Status: DC
Start: 2019-04-13 — End: 2019-04-15
  Administered 2019-04-13: 14:00:00 0 mg via ORAL
  Administered 2019-04-13 – 2019-04-15 (×4): 2.5 mg via ORAL
  Filled 2019-04-13 (×4): qty 1

## 2019-04-13 MED ORDER — ONDANSETRON HCL (PF) 4 MG/2 ML INJECTION SOLUTION
4.0000 mg | Freq: Four times a day (QID) | INTRAMUSCULAR | Status: DC | PRN
Start: 2019-04-13 — End: 2019-04-15

## 2019-04-13 MED ORDER — ACETAMINOPHEN 325 MG TABLET
650.00 mg | ORAL_TABLET | ORAL | Status: AC
Start: 2019-04-13 — End: 2019-04-13
  Administered 2019-04-13: 650 mg via ORAL
  Filled 2019-04-13: qty 2

## 2019-04-13 MED ORDER — LIPASE-PROTEASE-AMYLASE 12,000-38,000-60,000 UNIT CAPSULE,DELAYED REL
2.00 | DELAYED_RELEASE_CAPSULE | Freq: Three times a day (TID) | ORAL | Status: DC
Start: 2019-04-13 — End: 2019-04-14
  Administered 2019-04-13: 0 via ORAL
  Administered 2019-04-14: 12:00:00
  Administered 2019-04-14: 0 via ORAL

## 2019-04-13 MED ORDER — LORAZEPAM 0.5 MG TABLET
0.50 mg | ORAL_TABLET | ORAL | Status: DC | PRN
Start: 2019-04-13 — End: 2019-04-15

## 2019-04-13 MED ORDER — GENTAMICIN 40 MG/ML INJECTION SOLUTION
INTRAMUSCULAR | Status: AC
Start: 2019-04-13 — End: 2019-04-13
  Filled 2019-04-13: qty 2

## 2019-04-13 MED ORDER — ATORVASTATIN 10 MG TABLET
10.00 mg | ORAL_TABLET | Freq: Every evening | ORAL | Status: DC
Start: 2019-04-13 — End: 2019-04-15
  Administered 2019-04-13 – 2019-04-14 (×2): 10 mg via ORAL
  Filled 2019-04-13 (×2): qty 1

## 2019-04-13 MED ORDER — METOPROLOL SUCCINATE ER 25 MG TABLET,EXTENDED RELEASE 24 HR
25.0000 mg | ORAL_TABLET | Freq: Every day | ORAL | Status: DC
Start: 2019-04-13 — End: 2019-04-15
  Administered 2019-04-13: 0 mg via ORAL
  Administered 2019-04-14 – 2019-04-15 (×2): 25 mg via ORAL
  Filled 2019-04-13 (×2): qty 1

## 2019-04-13 MED ORDER — INSULIN LISPRO 100 UNIT/ML SUB-Q SSIP - RMH
0.00 [IU] | INJECTION | Freq: Four times a day (QID) | SUBCUTANEOUS | Status: DC | PRN
Start: 2019-04-13 — End: 2019-04-15
  Administered 2019-04-14: 6 [IU] via SUBCUTANEOUS
  Administered 2019-04-14: 4 [IU] via SUBCUTANEOUS
  Filled 2019-04-13: qty 6
  Filled 2019-04-13: qty 3

## 2019-04-13 MED ORDER — INSULIN LISPRO 100 UNIT/ML SUB-Q - CHARGE BY DOSE
5.00 [IU] | Freq: Two times a day (BID) | SUBCUTANEOUS | Status: DC
Start: 2019-04-13 — End: 2019-04-15
  Administered 2019-04-13: 5 [IU] via SUBCUTANEOUS
  Administered 2019-04-14: 07:00:00 0 [IU] via SUBCUTANEOUS
  Administered 2019-04-14 – 2019-04-15 (×2): 5 [IU] via SUBCUTANEOUS
  Filled 2019-04-13 (×3): qty 5

## 2019-04-13 MED ORDER — SODIUM CHLORIDE 0.9 % INTRAVENOUS SOLUTION
INTRAVENOUS | Status: DC
Start: 2019-04-13 — End: 2019-04-15
  Administered 2019-04-13 – 2019-04-14 (×3): via INTRAVENOUS

## 2019-04-13 MED ORDER — FUROSEMIDE 40 MG TABLET
40.0000 mg | ORAL_TABLET | Freq: Every day | ORAL | Status: DC
Start: 2019-04-13 — End: 2019-04-15
  Administered 2019-04-13: 0 mg via ORAL
  Administered 2019-04-14 – 2019-04-15 (×2): 40 mg via ORAL
  Filled 2019-04-13 (×2): qty 1

## 2019-04-13 MED ORDER — SODIUM CHLORIDE 0.9 % INTRAVENOUS SOLUTION
2.00 mg/kg | Freq: Three times a day (TID) | INTRAVENOUS | Status: DC
Start: 2019-04-13 — End: 2019-04-13
  Administered 2019-04-13: 0 mg via INTRAVENOUS

## 2019-04-13 MED ORDER — MAGNESIUM HYDROXIDE 400 MG/5 ML ORAL SUSPENSION
15.0000 mL | Freq: Every day | ORAL | Status: DC | PRN
Start: 2019-04-13 — End: 2019-04-15

## 2019-04-13 MED ORDER — ASPIRIN 81 MG TABLET,DELAYED RELEASE
81.0000 mg | DELAYED_RELEASE_TABLET | Freq: Every day | ORAL | Status: DC
Start: 2019-04-13 — End: 2019-04-15
  Administered 2019-04-13 – 2019-04-15 (×3): 81 mg via ORAL
  Filled 2019-04-13 (×3): qty 1

## 2019-04-13 MED ORDER — INSULIN GLARGINE 100 UNITS/ML SUBQ - CHARGE BY DOSE
10.00 [IU] | Freq: Every evening | SUBCUTANEOUS | Status: DC
Start: 2019-04-13 — End: 2019-04-15
  Administered 2019-04-13: 0 [IU] via SUBCUTANEOUS
  Administered 2019-04-14: 10 [IU] via SUBCUTANEOUS
  Filled 2019-04-13: qty 10

## 2019-04-13 MED ORDER — MONTELUKAST 10 MG TABLET
10.0000 mg | ORAL_TABLET | Freq: Every day | ORAL | Status: DC
Start: 2019-04-13 — End: 2019-04-15
  Administered 2019-04-13 – 2019-04-15 (×3): 10 mg via ORAL
  Filled 2019-04-13 (×3): qty 1

## 2019-04-13 MED ORDER — SODIUM CHLORIDE 0.9 % INTRAVENOUS SOLUTION
2.00 mg/kg | Freq: Three times a day (TID) | INTRAVENOUS | Status: DC
Start: 2019-04-13 — End: 2019-04-15
  Administered 2019-04-13: 0 mg via INTRAVENOUS
  Administered 2019-04-13: 120 mg via INTRAVENOUS
  Administered 2019-04-14 (×3): 0 mg via INTRAVENOUS
  Administered 2019-04-14: 120 mg via INTRAVENOUS
  Administered 2019-04-15 (×2): 0 mg via INTRAVENOUS
  Filled 2019-04-13 (×4): qty 4

## 2019-04-13 MED ORDER — SODIUM CHLORIDE 0.9 % (FLUSH) INJECTION SYRINGE
10.0000 mL | INJECTION | INTRAMUSCULAR | Status: DC | PRN
Start: 2019-04-13 — End: 2019-04-15
  Administered 2019-04-14 – 2019-04-15 (×2): 10 mL

## 2019-04-13 MED ORDER — GENTAMICIN 40 MG/ML INJECTION SOLUTION
2.00 mg/kg | INTRAMUSCULAR | Status: AC
Start: 2019-04-13 — End: 2019-04-13
  Administered 2019-04-13: 120 mg via INTRAVENOUS
  Administered 2019-04-13: 0 mg/kg via INTRAVENOUS
  Filled 2019-04-13 (×2): qty 4

## 2019-04-13 MED ORDER — HYDRALAZINE 10 MG TABLET
10.00 mg | ORAL_TABLET | Freq: Three times a day (TID) | ORAL | Status: DC
Start: 2019-04-13 — End: 2019-04-15
  Administered 2019-04-13 (×2): 0 mg via ORAL
  Administered 2019-04-14: 10 mg via ORAL
  Administered 2019-04-14: 0 mg via ORAL
  Administered 2019-04-14 – 2019-04-15 (×3): 10 mg via ORAL
  Filled 2019-04-13 (×4): qty 1

## 2019-04-13 MED ORDER — IOPAMIDOL 370 MG IODINE/ML (76 %) INTRAVENOUS SOLUTION
100.00 mL | INTRAVENOUS | Status: AC
Start: 2019-04-13 — End: 2019-04-13
  Administered 2019-04-13: 10:00:00 100 mL via INTRAVENOUS

## 2019-04-13 MED ADMIN — cetirizine 10 mg tablet: ORAL | @ 14:00:00

## 2019-04-13 MED ADMIN — iopamidoL 76 % intravenous solution: INTRAVENOUS | @ 10:00:00

## 2019-04-13 MED ADMIN — montelukast 10 mg tablet: ORAL | @ 15:00:00

## 2019-04-13 MED ADMIN — sodium chloride 0.9 % (flush) injection syringe: @ 15:00:00

## 2019-04-13 MED ADMIN — Medication: INTRAVENOUS | @ 19:00:00

## 2019-04-13 MED ADMIN — losartan 50 mg tablet: ORAL | @ 14:00:00

## 2019-04-13 MED ADMIN — metoprolol succinate ER 25 mg tablet,extended release 24 hr: ORAL | @ 14:00:00

## 2019-04-13 MED ADMIN — furosemide 40 mg tablet: ORAL | @ 14:00:00

## 2019-04-13 MED ADMIN — Medication: INTRAVENOUS | @ 10:00:00

## 2019-04-13 MED ADMIN — apixaban 5 mg tablet: ORAL | @ 14:00:00

## 2019-04-13 MED ADMIN — sodium chloride 1 gram tablet: ORAL | @ 19:00:00

## 2019-04-13 MED ADMIN — Medication: INTRAVENOUS | @ 14:00:00

## 2019-04-13 MED ADMIN — insulin lispro 100 unit/mL subcutaneous solution: SUBCUTANEOUS | @ 19:00:00

## 2019-04-13 MED ADMIN — acetaminophen 325 mg tablet: ORAL | @ 10:00:00

## 2019-04-13 MED ADMIN — sodium chloride 0.9 % intravenous solution: INTRAVENOUS | @ 11:00:00

## 2019-04-13 NOTE — ED Nurses Note (Signed)
Temp now 102.107F. Provider aware. Minicath for UA. Vitals stable. Second set blood culture drawn from port. No distress.

## 2019-04-13 NOTE — Nurses Notes (Signed)
Resting quietly in bed with eyes closed. No s/s of distress.

## 2019-04-13 NOTE — Nurses Notes (Signed)
Up to bedside commode with help. Alert. Oriented. No s.o.b. or low o2 saturation.

## 2019-04-13 NOTE — Nurses Notes (Signed)
Pts sister [name] asked to bring in pancrease and zyrtec from home for use while here. Pt sitting up in bed. Ate 100% of dinner and tolerated well. More alert this evening.

## 2019-04-13 NOTE — ED Nurses Note (Signed)
Awaiting admit orders per Dr [name].

## 2019-04-13 NOTE — ED Nurses Note (Signed)
Pt resting comfortably with eyes closed. Denies discomfort or needs at this time. Vitals stable. IVF infusing. Awaiting results and disposition.

## 2019-04-13 NOTE — ED Provider Notes (Signed)
Emergency Department  [facility]   04/13/2019     [place]  1946-01-06  73 y.o.  female  [place] [address]   [phone] (home)  PCP: Ignacia Felling, MD   Date of service:04/13/2019 08:18    Chief Complaint:   Chief Complaint   Patient presents with   . Altered Mental Status           HPI: This is a 73 y.o. female who presents to the emergency department via EMS.  EMS reports that according to her sister with whom patient lives with that she has been lethargic for 3 days.  Family reported she had not been sleeping well for the last 2 days.  EMS also reported initial O2 sat was 84% on room air.  Patient reports she has home O2 but does not use it very often.  Patient is oriented to self day time.  Patient remembers how she got here today.  Patient reports she has been having some low back pain and headaches.  Patient reports nothing makes it better worse.  Rates her pain as a 5/10.  Severity is moderate.              Past Medical History:   Past Medical History:   Diagnosis Date   . Abdominal hernia 10/18/2017    hx of repair   . Anxiety    . Arthritis    . Asthma    . Atrial fibrillation (CMS HCC)    . Back problem    . Bruises easily    . Cancer (CMS [place]) 10/18/2017    chemo and radiation completed 09/24/2017   . COPD (chronic obstructive pulmonary disease) (CMS HCC)    . CPAP (continuous positive airway pressure) dependence 10/18/2017    has not used recently   . Depression    . DM (diabetes mellitus) (CMS HCC) 2000    Fasting BG 200's. HGA1C 2018 6   . Edema    . Essential hypertension    . GERD (gastroesophageal reflux disease)    . Headache    . Heartburn    . Hx antineoplastic chemo 2018   . Hx of radiation therapy 2018   . Hypercholesteremia    . Hyperlipidemia    . Migraine 10/18/2017    none recently   . Obesity    . Palpitations    . Pancreatic cancer (CMS [place]) 03/2017   . Panic attack    . PE (pulmonary thromboembolism) (CMS HCC) 03/29/2018   . Peripheral neuropathy  10/18/2017    knees down, bilateral   . Sleep apnea    . Type 2 diabetes mellitus (CMS HCC) 10/18/2017    Dx 2000 FBS 200s   . Unintentional weight loss 10/18/2017    25 lbs 03/2017   . Wears glasses      (Not in an outpatient encounter)     Past Surgical History:   Past Surgical History:   Procedure Laterality Date   . Cesarean section     . Hx hernia repair     . Hx subclavian port implantion     . Hx subclavian port implantion     . Hx tonsil and adenoidectomy     . Hx tonsillectomy     . Umbilical hernia repair         Social History:   Social History     Tobacco Use   . Smoking status: Former Smoker     Packs/day: 0.50  Years: 20.00     Pack years: 10.00     Last attempt to quit: 10/18/1993     Years since quitting: 25.5   . Smokeless tobacco: Never Used   Substance Use Topics   . Alcohol use: No   . Drug use: No        Family History:  Family History   Problem Relation Age of Onset   . Heart Disease Mother    . Diabetes Mother      Prior to Admission medications    Medication Sig Start Date End Date Taking? Authorizing Provider   apixaban (ELIQUIS) 2.5 mg Oral Tablet Take 1 Tab (2.5 mg total) by mouth Twice daily 09/25/18   Jonah Blue, FNP-BC   aspirin (ECOTRIN) 81 mg Oral Tablet, Delayed Release (E.C.) Take 81 mg by mouth    Provider, Historical   atorvastatin (LIPITOR) 10 mg Oral Tablet Take 10 mg by mouth 01/28/17   Provider, Historical   cetirizine (ZYRTEC) 10 mg Oral Tablet Take 10 mg by mouth    Provider, Historical   furosemide (LASIX) 20 mg Oral Tablet Take 2 Tabs (40 mg total) by mouth Once a day 09/25/18   Jonah Blue, FNP-BC   hydrALAZINE (APRESOLINE) 10 mg Oral Tablet Take 1 Tab (10 mg total) by mouth Every 8 hours  Patient taking differently: Take 10 mg by mouth Twice daily  09/25/18   Jonah Blue, FNP-BC   HYDROcodone-acetaminophen (NORCO) 5-325 mg Oral Tablet Take 1 Tab by mouth Every 4 hours as needed for Pain    Provider, Historical   hydrocortisone-pramoxine  (PROCTOFOAM HC) 1-1 % Rectal Foam 1 Applicator by Rectal route Every 6 hours  Patient taking differently: 1 Applicator by Rectal route Twice per day as needed  09/25/18   Jonah Blue, FNP-BC   insulin aspart U-100 (NOVOLOG FLEXPEN U-100 INSULIN) 100 unit/mL (3 mL) Subcutaneous Insulin Pen 5 Units by Subcutaneous route Twice a day before meals 10/29/18   Ignacia Felling, MD   insulin glargine (LANTUS SOLOSTAR U-100 INSULIN) 100 unit/mL Subcutaneous Insulin Pen 8 Units by Subcutaneous route Every night 10/29/18   Ignacia Felling, MD   LORazepam (ATIVAN) 0.5 mg Oral Tablet 0.5 mg Once a day  10/06/17   Provider, Historical   losartan (COZAAR) 100 mg Oral Tablet Take 100 mg by mouth Once a day  01/28/17   Provider, Historical   metoprolol succinate (TOPROL-XL) 50 mg Oral Tablet Sustained Release 24 hr Take 0.5 Tabs (25 mg total) by mouth Once a day 09/25/18   Jonah Blue, FNP-BC   montelukast (SINGULAIR) 10 mg Oral Tablet Take 10 mg by mouth 02/08/17   Provider, Historical   omeprazole (PRILOSEC) 40 mg Oral Capsule, Delayed Release(E.C.) Take 1 Cap (40 mg total) by mouth Once a day 02/07/18   Nicola Police, MD   ondansetron [facility]) 4 mg Oral Tablet Take 4 mg by mouth Every 6 hours as needed for nausea/vomiting    Provider, Historical   pancreatic enzyme replacement (CREON) 12,000-38,000 -60,000 unit Oral Capsule, Delayed Release(E.C.) Take by mouth Three times daily with meals 2 CAPS WITH MEALS AND 1 CAP WITH [facility]    Provider, Historical   sodium chloride 1 gram Oral Tablet Take 1 Tab (1 g total) by mouth Three times daily with meals 10/30/18   Ignacia Felling, MD          Allergies:   Allergies   Allergen Reactions   . Sulfa (Sulfonamides) Itching  Above history reviewed with patient.  Allergies, medication list, reviewed.       Review of Systems -   Constitutional: Negative for fever.  Positive for chills.   Skin: Negative for rash or other lesions.   HENT:  Positive for headaches.      Eyes: Negative for blurred vision and double vision.   Cardiovascular: Negative for chest pain and palpitations.   Respiratory: Negative for cough. Is not experiencing shortness of breath. No wheezing.  Gastrointestinal: Negative for nausea, vomiting and abdominal pain. No diarrhea, constipation or hematochezia.  Genitourinary: Negative for dysuria, urgency, frequency, or hematuria.   Musculoskeletal:  Positive for low back pain  Neurological: Negative for dizziness and loss of consciousness.  No numbness, tingling, or other sensation changed.  Psych:  Denies anxiety or depression.  All other systems reviewed and are negative.        Physical Exam  Body mass index is 26.52 kg/m.  ED Triage Vitals [04/13/19 0803]   BP (Non-Invasive) (!) 157/90   Heart Rate 95   Respiratory Rate 14   Temperature 37.4 C (99.4 F)   SpO2 100 %   Weight 65.8 kg (145 lb)   Height 1.575 m (_0 )       Constitutional: patient is oriented to person, place, and time and well-developed, well-nourished, and in no distress.   HENT:   Head: Normocephalic and atraumatic.   Right Ear: External ear normal.  TM normal  Left Ear: External ear normal.  TM normal  Nose: Nose normal.  No rhinorrhea.  No mucosal edema  Mouth/Throat: Oropharynx is clear and moist.   Eyes: Pupils are equal, round, and reactive to light. Conjunctivae and EOM are normal.   Neck: Normal range of motion. Neck supple.   Cardiovascular: Normal rate, regular rhythm, normal heart sounds and intact distal pulses.   Pulmonary/Chest: Effort normal and breath sounds normal.   Abdominal: Soft. Bowel sounds are normal.   Musculoskeletal: Normal range of motion.   Neurological:Patient is alert and oriented to person, place, and time.  Gait normal. GCS score is 15.   Skin: Skin is warm and dry.   Psychiatric: Mood, memory, affect and judgment normal.    The following orders were placed after examining the patient :  Orders Placed This Encounter   . ADULT ROUTINE BLOOD CULTURE, SET OF  2 BOTTLES (BACTERIA AND YEAST)   . ADULT ROUTINE BLOOD CULTURE, SET OF 2 BOTTLES (BACTERIA AND YEAST)   . RAPID INFLUENZA A/B  - NASOPHARYNGEAL SWAB   . URINE CULTURE,ROUTINE   . CT BRAIN WO IV CONTRAST   . CT ANGIO CHEST FOR PULMONARY EMBOLUS W IV CONTRAST   . COVID-19 SCREENING - Admitted or Likely to be Admitted   . CBC/DIFF   . COMPREHENSIVE METABOLIC PANEL, NON-FASTING   . TROPONIN-I   . LACTIC ACID LEVEL W/ REFLEX FOR LEVEL >2.0   . ARTERIAL BLOOD GAS   . URINALYSIS, MACROSCOPIC AND MICROSCOPIC   . D-DIMER   . CBC WITH DIFF   . URINALYSIS, MACROSCOPIC   . URINALYSIS, MICROSCOPIC   . CANCELED: EXTRA TUBES   . EXTRA TUBES   . RED TOP TUBE   . OXYGEN - NASAL CANNULA   . ECG 12-LEAD   . INSERT & MAINTAIN PERIPHERAL IV ACCESS   . gentamicin (GARAMYCIN) 120 mg in NS 100 mL IVPB   . acetaminophen (TYLENOL) tablet   . gentamicin (GARAMYCIN) 40 mg/mL injection ---Cabinet Override   . NS  bolus infusion 1,000 mL   . NS bolus infusion 50 mL   . iopamidol (ISOVUE-370) 76% infusion      CT ANGIO CHEST FOR PULMONARY EMBOLUS W IV CONTRAST   Final Result   No evidence for pulmonary embolism. No acute infiltrate or   effusion   Tiny lung nodules right middle lobe and lingular area, I would suggest   follow-up to exclude metastatic disease considering patient's history of   carcinoma.   Abnormal esophagus with fluid filling distention and wall thickening.   Small pericardial effusion         The CT exam was performed using one or more the following a dose reduction   techniques: Automated exposure control, adjustment of the mA and/or kV   according to the patient's size, or use of iterative reconstruction   technique.            Radiologist location ID: [id number]         CT BRAIN WO IV CONTRAST   Final Result   Chronic appearing change without evidence for acute finding as   above         The CT exam was performed using one or more the following a dose reduction   techniques: Automated exposure control, adjustment of the mA and/or  kV   according to the patient's size, or use of iterative reconstruction   technique.            Radiologist location ID: [id number]            Labs Reviewed   COMPREHENSIVE METABOLIC PANEL, NON-FASTING - Abnormal; Notable for the following components:       Result Value    SODIUM 133 (*)     BUN 21 (*)     ALBUMIN 3.4 (*)     ALKALINE PHOSPHATASE 227 (*)     ALT (SGPT) 152 (*)     AST (SGOT) 332 (*)     PROTEIN TOTAL 5.9 (*)     All other components within normal limits    Narrative:     Estimated Glomerular Filtration Rate (eGFR) calculated using the CKD-EPI (2009) equation, intended for patients 1 years of age and older. If race and/or gender is not documented or "unknown," there will be no eGFR calculation.   TROPONIN-I - Abnormal; Notable for the following components:    TROPONIN I 0.04 (*)     All other components within normal limits   ARTERIAL BLOOD GAS - Abnormal; Notable for the following components:    PH (ARTERIAL) 7.49 (*)     PCO2 (ARTERIAL) 28.7 (*)     PO2 (ARTERIAL) 136.7 (*)     BICARBONATE (ARTERIAL) 21.3 (*)     All other components within normal limits   D-DIMER - Abnormal; Notable for the following components:    D-DIMER 0.99 (*)     All other components within normal limits   CBC WITH DIFF - Abnormal; Notable for the following components:    RBC 3.59 (*)     HGB 10.4 (*)     HCT 32.2 (*)     PLATELETS 100 (*)     NEUTROPHIL # 8.50 (*)     LYMPHOCYTE # 0.51 (*)     All other components within normal limits   URINALYSIS, MACROSCOPIC - Abnormal; Notable for the following components:    NITRITE Positive (*)     PROTEIN Trace (*)     BLOOD Moderate (*)  All other components within normal limits   URINALYSIS, MICROSCOPIC - Abnormal; Notable for the following components:    RBCS 50-100 (*)     WBCS 20-50 (*)     BACTERIA Many (*)     COARSE GRANULAR CASTS Moderate (*)     All other components within normal limits   RAPID INFLUENZA A/B ANTIGEN - Normal   LACTIC ACID LEVEL W/ REFLEX FOR LEVEL >2.0 -  Normal   ADULT ROUTINE BLOOD CULTURE, SET OF 2 BOTTLES (BACTERIA AND YEAST)   ADULT ROUTINE BLOOD CULTURE, SET OF 2 BOTTLES (BACTERIA AND YEAST)   URINE CULTURE,ROUTINE   CBC/DIFF    Narrative:     The following orders were created for panel order CBC/DIFF.  Procedure                               Abnormality         Status                     ---------                               -----------         ------                     CBC WITH DIFF[[id number]]                Abnormal            Final result                 Please view results for these tests on the individual orders.   URINALYSIS, MACROSCOPIC AND MICROSCOPIC    Narrative:     The following orders were created for panel order URINALYSIS, MACROSCOPIC AND MICROSCOPIC.  Procedure                               Abnormality         Status                     ---------                               -----------         ------                     URINALYSIS, MACROSCOPIC[[id number]]      Abnormal            Final result               URINALYSIS, MICROSCOPIC[[id number]]      Abnormal            Final result                 Please view results for these tests on the individual orders.   COVID-19  MOLECULAR LAB TESTING   EXTRA TUBES    Narrative:     The following orders were created for panel order EXTRA TUBES.  Procedure  Abnormality         Status                     ---------                               -----------         ------                     RED TOP QPYP[[id number]]                                     In process                   Please view results for these tests on the individual orders.   RED TOP TUBE      Results for orders placed or performed during the hospital encounter of 04/13/19 (from the past 12 hour(s))   COMPREHENSIVE METABOLIC PANEL, NON-FASTING   Result Value Ref Range    SODIUM 133 (L) 137 - 145 mmol/L    POTASSIUM 3.5 3.5 - 5.1 mmol/L    CHLORIDE 98 98 - 107 mmol/L    CO2 TOTAL 27 22 - 30 mmol/L    ANION GAP  8 8 - 16 mmol/L    BUN 21 (H) 7 - 17 mg/dL    CREATININE 0.80 0.52 - 1.04 mg/dL    BUN/CREA RATIO 26     ESTIMATED GFR >60 >60 mL/min/1.59m2    ALBUMIN 3.4 (L) 3.5 - 5.0 g/dL    CALCIUM 8.9 8.4 - 10.2 mg/dL    GLUCOSE 77 74 - 106 mg/dL    ALKALINE PHOSPHATASE 227 (H) 38 - 126 U/L    ALT (SGPT) 152 (H) 0 - 34 U/L    AST (SGOT) 332 (H) 14 - 36 U/L    BILIRUBIN TOTAL 0.7 0.2 - 1.3 mg/dL    PROTEIN TOTAL 5.9 (L) 6.3 - 8.2 g/dL   TROPONIN-I   Result Value Ref Range    TROPONIN I 0.04 (H) 0.00 - 0.03 ng/mL   LACTIC ACID LEVEL W/ REFLEX FOR LEVEL >2.0   Result Value Ref Range    LACTIC ACID 1.2 0.7 - 2.0 mmol/L   D-DIMER   Result Value Ref Range    D-DIMER 0.99 (H) 0.00 - 0.59 mg/L   CBC WITH DIFF   Result Value Ref Range    WBC 9.8 3.7 - 11.0 x10^3/uL    RBC 3.59 (L) 3.85 - 5.22 x10^6/uL    HGB 10.4 (L) 11.5 - 16.0 g/dL    HCT 32.2 (L) 34.8 - 46.0 %    MCV 89.7 78.0 - 100.0 fL    MCH 29.0 26.0 - 32.0 pg    MCHC 32.3 31.0 - 35.5 g/dL    RDW-CV 13.2 11.5 - 15.5 %    PLATELETS 100 (L) 150 - 400 x10^3/uL    MPV 11.9 8.7 - 12.5 fL    NEUTROPHIL % 88 %    LYMPHOCYTE % 5 %    MONOCYTE % 7 %    EOSINOPHIL % 0 %    BASOPHIL % 0 %    NEUTROPHIL # 8.50 (H) 1.50 - 7.70 x10^3/uL    LYMPHOCYTE # 0.51 (L) 1.00 - 4.80 x10^3/uL    MONOCYTE # 0.65 0.20 - 1.10  x10^3/uL    EOSINOPHIL # <0.10 <=0.50 x10^3/uL    BASOPHIL # <0.10 <=0.20 x10^3/uL    IMMATURE GRANULOCYTE % 0 0 - 1 %    IMMATURE GRANULOCYTE # <0.10 <0.10 x10^3/uL   RAPID INFLUENZA A/B  - NASOPHARYNGEAL SWAB   Result Value Ref Range    INFLUENZA A ANTIGEN Negative Negative    INFLUENZA B ANTIGEN Negative Negative   ARTERIAL BLOOD GAS   Result Value Ref Range    PH (ARTERIAL) 7.49 (H) 7.35 - 7.45    PCO2 (ARTERIAL) 28.7 (L) 35.0 - 45.0 mm/Hg    PO2 (ARTERIAL) 136.7 (H) 80.0 - 100.0 mm/Hg    BICARBONATE (ARTERIAL) 21.3 (L) 24.0 - 26.0 mmol/L    BASE DEFICIT 1.2 -2.0 - 2.0 mmol/L    O2 SATURATION (ARTERIAL) 95.1 88.0 - 100.0 %    %FIO2 (ARTERIAL) 28 %    ALLEN TEST yes     DRAW SITE  right radial     RESPIRATORY RATE 18    URINALYSIS, MACROSCOPIC   Result Value Ref Range    COLOR Yellow Yellow, Straw    APPEARANCE Clear Clear, Slightly Hazy    SPECIFIC GRAVITY 1.020 1.005 - 1.030    PH 5.5 5.0 - 8.5    LEUKOCYTES Negative Negative WBCs/uL    NITRITE Positive (A) Negative    PROTEIN Trace (A) Negative mg/dL    GLUCOSE Negative Negative mg/dL    KETONES Negative Negative mg/dL    UROBILINOGEN 0.2  0.2 mg/dL    BILIRUBIN Negative Negative mg/dL    BLOOD Moderate (A) Negative mg/dL   URINALYSIS, MICROSCOPIC   Result Value Ref Range    RBCS 50-100 (A) None, Occasional, 0-2, 3-5 /hpf    WBCS 20-50 (A) None, Occasional, 0-2, 3-5 /hpf    BACTERIA Many (A) None /hpf    SQUAMOUS EPITHELIAL 0-2 None, Occasional, 0-2, 3-5 /hpf    RENAL EPITHELIAL 0-2 None, Occasional, 0-2, 3-5 /hpf    MUCOUS None None /hpf    AMORPHOUS SEDIMENT None None /hpf    COARSE GRANULAR CASTS Moderate (A) None /lpf         ED Course:   ED Course as of Apr 13 1143   Sat Apr 13, 2019   0900 White blood cell count of 9.8 which is actually significantly elevated for her.  Patient has a chronic neutropenia with white count usually around 3. Thrombocytopenia is chronic and stable.  Anemia is chronic and stable.   CBC/DIFF(!) [SM]   0901 ABG shows hyper oxygenation.  O2 has been discontinued.  Sats are good on room air   ARTERIAL BLOOD GAS(!) [SM]   0915 Normal lactic acid   LACTIC ACID LEVEL W/ REFLEX FOR LEVEL >2.0 [SM]   0927 Indeterminate troponin of 0.04.   TROPONIN-I(!) [SM]   [id number] Urinalysis findings consistent with infection.  Review of old chart shows some on 08/25/2018 patient had a here to GI that grew VRE.   URINALYSIS, MACROSCOPIC AND MICROSCOPIC(!) [SM]   [id number] D-dimer elevated at 0.99.   D-DIMER(!) [SM]   [phone] Increased AST and ALT which is new for patient.  Minimally increased BUN of 21.    COMPREHENSIVE METABOLIC PANEL, NON-FASTING(!) [SM]   0936 Flu test is negative   RAPID INFLUENZA A/B  - NASOPHARYNGEAL SWAB [SM]   1132  Discussed code status with patient and she is DNR.    [SM]   64 Dr. Karmen Bongo paged to discuss admission    [SM]  1143 Case discussed with Dr. Karmen Bongo who will be into the ER to see patient.    [SM]      ED Course User Index  [SM] Sharma Covert, MD      Medications   NS bolus infusion 50 mL (has no administration in time range)   gentamicin (GARAMYCIN) 120 mg in NS 100 mL IVPB (0 mg/kg  56.4 kg (Adjusted) Intravenous Stopped 04/13/19 1005)   acetaminophen (TYLENOL) tablet (650 mg Oral Given 04/13/19 0940)   gentamicin (GARAMYCIN) 40 mg/mL injection ---Cabinet Override (  Unlinkable Pull 04/13/19 1000)   NS bolus infusion 1,000 mL (0 mL Intravenous Stopped 04/13/19 1117)   iopamidol (ISOVUE-370) 76% infusion (100 mL Intravenous Given 04/13/19 1015)        Emergency Department Procedure:    EKG Interpertation:  Sinus rhythm with rate of 91. Occasional premature ventricular complexes.  Normal ST segments.  Normal p.r..  Normal QRS.  Impression PVC otherwise normal EKG       MDM:  Patient with significant urinary tract infection fever elevated white count of her baseline and increased LFTs.  Patient meets sepsis criteria.  IV gentamicin has been given based on previous urine culture showing VRE susceptible to gentamicin.  Case discussed with Dr. Karmen Bongo is agreeable to admit the patient.      Findings and diagnosis discussed with patient.  Clinical Impression:   Encounter Diagnoses   Name Primary?   . Acute cystitis without hematuria Yes   . Sepsis without acute organ dysfunction, due to unspecified organism (CMS [place])    . Nonintractable headache, unspecified chronicity pattern, unspecified headache type    . Transient alteration of awareness        Critical Care Time:  Total critical care time spent in direct care of this patient at high risk based on presenting history/exam/and complaint, including initial evaluation and stabilization, review of data, re-examination, discussion with admitting and consulting  services to arrange definitive care, and exclusive of any procedures performed, was 1 hour.        Disposition: Admitted          No follow-ups on file.   New Prescriptions    No medications on file      No follow-up provider specified.   BP (!) 110/40   Pulse 91   Temp 37.3 C (99.2 F)   Resp 18   Ht 1.575 m (_0 )   Wt 65.8 kg (145 lb)   SpO2 96%   BMI 26.52 kg/m        Sharma Covert, MD5/01/2019              This note was partially created using voice recognition software and is inherently subject to errors including those of syntax and "sound alike " substitutions which may escape proof reading.  In such instances, original meaning may be extrapolated by contextual derivation.

## 2019-04-13 NOTE — Nurses Notes (Signed)
Received from ed this 73 year old female pt of Dr Martyn Ehrich  With dx of UTI and pui for covid. Arrived via carrier and placed to bed. Cont ecg initiated showing sinus rhythm with pvcs. Vss. Pt oriented but lethargic, rouses easily. medport to right chest wall is accessed without fluids infusing. sr up and callbell at hand. Will monitor closely. Isolation in place.

## 2019-04-13 NOTE — ED Nurses Note (Signed)
Pt returned from CT. NS bolus initiated. Call light at [facility]. Pt verbalizes no needs at this time.

## 2019-04-13 NOTE — ED Nurses Note (Signed)
Called and updated pt's sister, whom she lives with. Spoke with Caryl Bis at [phone]. Plan to admit. Dr Lynnette Caffey speaking with Dr Karmen Bongo.

## 2019-04-13 NOTE — H&P (Signed)
[facility]  Family Medicine  History & Physical    Date of Service:  04/13/2019  [name], [name], 73 y.o. female  Date of Admission:  04/13/2019  Date of Birth:  [birth year]  PCP: Ignacia Felling, MD  Code Status:   Code Status Information     Code Status    No CPR           Chief Complaint:  Lethargy for 3 days    HPI:  [name] is a 73 y.o. White female who is admitted for UTI with 3 days of lethargy at home.  She brought to the ER by EMS with a hx of having increased lethargy for the last 3 days but increase insomnia.  Workup in the ER  Shows UTI, fever to 102 in the ER. She was swabbed for COVID 19 by the ER doc.  She has a past hx of VRE UTI.  She was given gentamycin in the ER.  She has mild hyponatremia which is not new for her.  She denies pain, N&V, cough.  She does appear tired.  She is a DNR by her expressed wishes to Dr. Lynnette Caffey.      Past Medical History:   Diagnosis Date   . Abdominal hernia 10/18/2017    hx of repair   . Anxiety    . Arthritis    . Asthma    . Atrial fibrillation (CMS HCC)    . Back problem    . Bruises easily    . Cancer (CMS [place]) 10/18/2017    chemo and radiation completed 09/24/2017   . COPD (chronic obstructive pulmonary disease) (CMS HCC)    . CPAP (continuous positive airway pressure) dependence 10/18/2017    has not used recently   . Depression    . DM (diabetes mellitus) (CMS HCC) 2000    Fasting BG 200's. HGA1C 2018 6   . Edema    . Essential hypertension    . GERD (gastroesophageal reflux disease)    . Headache    . Heartburn    . Hx antineoplastic chemo 2018   . Hx of radiation therapy 2018   . Hypercholesteremia    . Hyperlipidemia    . Migraine 10/18/2017    none recently   . Obesity    . Palpitations    . Pancreatic cancer (CMS [place]) 03/2017   . Panic attack    . PE (pulmonary thromboembolism) (CMS HCC) 03/29/2018   . Peripheral neuropathy 10/18/2017    knees down, bilateral   . Sleep apnea    . Type 2 diabetes mellitus (CMS HCC) 10/18/2017     Dx 2000 FBS 200s   . Unintentional weight loss 10/18/2017    25 lbs 03/2017   . Wears glasses       Past Surgical History:   Procedure Laterality Date   . CESAREAN SECTION     . HX CESAREAN SECTION     . HX CHOLECYSTECTOMY     . HX HERNIA REPAIR     . HX SUBCLAVIAN PORT IMPLANTION      s/p removed   . HX SUBCLAVIAN PORT IMPLANTION     . HX TONSIL AND ADENOIDECTOMY     . HX TONSILLECTOMY     . PANCREATICODUODENECTOMY  2018   . UMBILICAL HERNIA REPAIR      x2      Social History     Tobacco Use   . Smoking status: Former Smoker  Packs/day: 0.50     Years: 20.00     Pack years: 10.00     Last attempt to quit: 10/18/1993     Years since quitting: 25.5   . Smokeless tobacco: Never Used   Substance Use Topics   . Alcohol use: No   . Drug use: No       Family Medical History:     Problem Relation (Age of Onset)    Diabetes Mother    Heart Disease Mother         Medications Prior to Admission     Prescriptions    apixaban (ELIQUIS) 2.5 mg Oral Tablet    Take 1 Tab (2.5 mg total) by mouth Twice daily    aspirin (ECOTRIN) 81 mg Oral Tablet, Delayed Release (E.C.)    Take 81 mg by mouth    atorvastatin (LIPITOR) 10 mg Oral Tablet    Take 10 mg by mouth    cetirizine (ZYRTEC) 10 mg Oral Tablet    Take 10 mg by mouth    furosemide (LASIX) 20 mg Oral Tablet    Take 2 Tabs (40 mg total) by mouth Once a day    hydrALAZINE (APRESOLINE) 10 mg Oral Tablet    Take 1 Tab (10 mg total) by mouth Every 8 hours    Patient taking differently:  Take 10 mg by mouth Twice daily     HYDROcodone-acetaminophen (NORCO) 5-325 mg Oral Tablet    Take 1 Tab by mouth Every 4 hours as needed for Pain    hydrocortisone-pramoxine (PROCTOFOAM HC) 1-1 % Rectal Foam    1 Applicator by Rectal route Every 6 hours    Patient taking differently:  1 Applicator by Rectal route Twice per day as needed     insulin aspart U-100 (NOVOLOG FLEXPEN U-100 INSULIN) 100 unit/mL (3 mL) Subcutaneous Insulin Pen    5 Units by Subcutaneous route Twice a day before meals     insulin glargine (LANTUS SOLOSTAR U-100 INSULIN) 100 unit/mL Subcutaneous Insulin Pen    8 Units by Subcutaneous route Every night    LORazepam (ATIVAN) 0.5 mg Oral Tablet    0.5 mg Once a day     losartan (COZAAR) 100 mg Oral Tablet    Take 100 mg by mouth Once a day     metoprolol succinate (TOPROL-XL) 50 mg Oral Tablet Sustained Release 24 hr    Take 0.5 Tabs (25 mg total) by mouth Once a day    montelukast (SINGULAIR) 10 mg Oral Tablet    Take 10 mg by mouth    omeprazole (PRILOSEC) 40 mg Oral Capsule, Delayed Release(E.C.)    Take 1 Cap (40 mg total) by mouth Once a day    ondansetron (ZOFRAN) 4 mg Oral Tablet    Take 4 mg by mouth Every 6 hours as needed for nausea/vomiting    pancreatic enzyme replacement (CREON) 12,000-38,000 -60,000 unit Oral Capsule, Delayed Release(E.C.)    Take by mouth Three times daily with meals 2 CAPS WITH MEALS AND 1 CAP WITH EACH SNACK    sodium chloride 1 gram Oral Tablet    Take 1 Tab (1 g total) by mouth Three times daily with meals         Allergies   Allergen Reactions   . Sulfa (Sulfonamides) Itching        ROS: All systems reviewed and negative accept as noted in HPI     Filed Vitals:    04/13/19 0915 04/13/19 1106  04/13/19 1220 04/13/19 1241   BP: (!) 124/49 (!) 110/40 (!) 102/41 (!) 99/37   Pulse: 90 91 78 80   Resp: 16 18 (!) 22 15   Temp: (!) 39 C (102.2 F) 37.3 C (99.2 F)  36.8 C (98.3 F)   SpO2: 100% 96% 96% 97%       Physical Exam   Constitutional: She is well-developed, well-nourished, and in no distress. No distress.   HENT:   Head: Normocephalic and atraumatic.   Right Ear: External ear normal.   Left Ear: External ear normal.   Nose: Nose normal.   Mouth/Throat: Oropharynx is clear and moist. No oropharyngeal exudate.   Eyes: Pupils are equal, round, and reactive to light. Conjunctivae and EOM are normal. Right eye exhibits no discharge. Left eye exhibits no discharge. No scleral icterus.   Neck: Normal range of motion. Neck supple. No JVD present. No  tracheal deviation present. No thyromegaly present.   Cardiovascular: Normal rate, regular rhythm and normal heart sounds. Exam reveals no gallop and no friction rub.   No murmur heard.  Pulmonary/Chest: Effort normal. No stridor. No respiratory distress. She has no wheezes. She has no rales. She exhibits no tenderness.   Mediport in the right upper chest   Musculoskeletal:         General: Edema present. No tenderness or deformity.      Comments: Able to move all four extremities   Neurological: She is alert. No cranial nerve deficit. GCS score is 15.   Skin: Skin is warm and dry. She is not diaphoretic. No erythema.       Laboratory Data:     Results for orders placed or performed during the hospital encounter of 04/13/19 (from the past 24 hour(s))   COMPREHENSIVE METABOLIC PANEL, NON-FASTING   Result Value Ref Range    SODIUM 133 (L) 137 - 145 mmol/L    POTASSIUM 3.5 3.5 - 5.1 mmol/L    CHLORIDE 98 98 - 107 mmol/L    CO2 TOTAL 27 22 - 30 mmol/L    ANION GAP 8 8 - 16 mmol/L    BUN 21 (H) 7 - 17 mg/dL    CREATININE 0.80 0.52 - 1.04 mg/dL    BUN/CREA RATIO 26     ESTIMATED GFR >60 >60 mL/min/1.35m^2    ALBUMIN 3.4 (L) 3.5 - 5.0 g/dL    CALCIUM 8.9 8.4 - 10.2 mg/dL    GLUCOSE 77 74 - 106 mg/dL    ALKALINE PHOSPHATASE 227 (H) 38 - 126 U/L    ALT (SGPT) 152 (H) 0 - 34 U/L    AST (SGOT) 332 (H) 14 - 36 U/L    BILIRUBIN TOTAL 0.7 0.2 - 1.3 mg/dL    PROTEIN TOTAL 5.9 (L) 6.3 - 8.2 g/dL   TROPONIN-I   Result Value Ref Range    TROPONIN I 0.04 (H) 0.00 - 0.03 ng/mL   LACTIC ACID LEVEL W/ REFLEX FOR LEVEL >2.0   Result Value Ref Range    LACTIC ACID 1.2 0.7 - 2.0 mmol/L   D-DIMER   Result Value Ref Range    D-DIMER 0.99 (H) 0.00 - 0.59 mg/L   CBC WITH DIFF   Result Value Ref Range    WBC 9.8 3.7 - 11.0 x10^3/uL    RBC 3.59 (L) 3.85 - 5.22 x10^6/uL    HGB 10.4 (L) 11.5 - 16.0 g/dL    HCT 32.2 (L) 34.8 - 46.0 %    MCV 89.7 78.0 -  100.0 fL    MCH 29.0 26.0 - 32.0 pg    MCHC 32.3 31.0 - 35.5 g/dL    RDW-CV 13.2 11.5 - 15.5 %     PLATELETS 100 (L) 150 - 400 x10^3/uL    MPV 11.9 8.7 - 12.5 fL    NEUTROPHIL % 88 %    LYMPHOCYTE % 5 %    MONOCYTE % 7 %    EOSINOPHIL % 0 %    BASOPHIL % 0 %    NEUTROPHIL # 8.50 (H) 1.50 - 7.70 x10^3/uL    LYMPHOCYTE # 0.51 (L) 1.00 - 4.80 x10^3/uL    MONOCYTE # 0.65 0.20 - 1.10 x10^3/uL    EOSINOPHIL # <0.10 <=0.50 x10^3/uL    BASOPHIL # <0.10 <=0.20 x10^3/uL    IMMATURE GRANULOCYTE % 0 0 - 1 %    IMMATURE GRANULOCYTE # <0.10 <0.10 x10^3/uL   RAPID INFLUENZA A/B  - NASOPHARYNGEAL SWAB   Result Value Ref Range    INFLUENZA A ANTIGEN Negative Negative    INFLUENZA B ANTIGEN Negative Negative   RED TOP TUBE   Result Value Ref Range    RAINBOW/EXTRA TUBE AUTO RESULT Yes    ARTERIAL BLOOD GAS   Result Value Ref Range    PH (ARTERIAL) 7.49 (H) 7.35 - 7.45    PCO2 (ARTERIAL) 28.7 (L) 35.0 - 45.0 mm/Hg    PO2 (ARTERIAL) 136.7 (H) 80.0 - 100.0 mm/Hg    BICARBONATE (ARTERIAL) 21.3 (L) 24.0 - 26.0 mmol/L    BASE DEFICIT 1.2 -2.0 - 2.0 mmol/L    O2 SATURATION (ARTERIAL) 95.1 88.0 - 100.0 %    %FIO2 (ARTERIAL) 28 %    ALLEN TEST yes     DRAW SITE right radial     RESPIRATORY RATE 18    URINALYSIS, MACROSCOPIC   Result Value Ref Range    COLOR Yellow Yellow, Straw    APPEARANCE Clear Clear, Slightly Hazy    SPECIFIC GRAVITY 1.020 1.005 - 1.030    PH 5.5 5.0 - 8.5    LEUKOCYTES Negative Negative WBCs/uL    NITRITE Positive (A) Negative    PROTEIN Trace (A) Negative mg/dL    GLUCOSE Negative Negative mg/dL    KETONES Negative Negative mg/dL    UROBILINOGEN 0.2  0.2 mg/dL    BILIRUBIN Negative Negative mg/dL    BLOOD Moderate (A) Negative mg/dL   URINALYSIS, MICROSCOPIC   Result Value Ref Range    RBCS 50-100 (A) None, Occasional, 0-2, 3-5 /hpf    WBCS 20-50 (A) None, Occasional, 0-2, 3-5 /hpf    BACTERIA Many (A) None /hpf    SQUAMOUS EPITHELIAL 0-2 None, Occasional, 0-2, 3-5 /hpf    RENAL EPITHELIAL 0-2 None, Occasional, 0-2, 3-5 /hpf    MUCOUS None None /hpf    AMORPHOUS SEDIMENT None None /hpf    COARSE GRANULAR CASTS  Moderate (A) None /lpf       Imaging Studies:    CT ANGIO CHEST FOR PULMONARY EMBOLUS W IV CONTRAST   Final Result   No evidence for pulmonary embolism. No acute infiltrate or   effusion   Tiny lung nodules right middle lobe and lingular area, I would suggest   follow-up to exclude metastatic disease considering patient's history of   carcinoma.   Abnormal esophagus with fluid filling distention and wall thickening.   Small pericardial effusion         The CT exam was performed using one or more the following a dose reduction  techniques: Automated exposure control, adjustment of the mA and/or kV   according to the patient's size, or use of iterative reconstruction   technique.            Radiologist location ID: [id number]         CT BRAIN WO IV CONTRAST   Final Result   Chronic appearing change without evidence for acute finding as   above         The CT exam was performed using one or more the following a dose reduction   techniques: Automated exposure control, adjustment of the mA and/or kV   according to the patient's size, or use of iterative reconstruction   technique.            Radiologist location ID: [id number]             Assessment/Plan:  Active Hospital Problems    Diagnosis   . Primary Problem: Urinary tract infection   . COPD (chronic obstructive pulmonary disease) (CMS HCC)   . Essential hypertension   . CHF (congestive heart failure) (CMS HCC)   . DNR (do not resuscitate)   . Hyperlipidemia   . SIADH (syndrome of inappropriate ADH production) (CMS [place])       Admit ICU for respiratory isolation. IV Gentamycin.  Await for COVID 19 testing.  Blood and urine cultures pending.  DNR statis per her wishes.    Myrene Buddy, MD

## 2019-04-13 NOTE — Nurses Notes (Signed)
Accu-check of 125mg s./dl. Holding all insulin at this time. Did not eat a snack. BP-133/49. Holding hydralazine at this time.

## 2019-04-13 NOTE — Care Plan (Signed)
Problem: Adult Inpatient Plan of Care  Goal: Patient-Specific Goal (Individualized)  04/13/2019 1851 by Derrill Kay, RN  Flowsheets (Taken 04/13/2019 1345)  Anxieties, Fears or Concerns: covid screen  Note: Pt remains in isolation as pui for covid, receiving ivfs and ivabs for UTI. Pt more alert this evening and ate all of dinner.  04/13/2019 1851 by Derrill Kay, RN  Outcome: Ongoing (see interventions/notes)

## 2019-04-13 NOTE — ED Nurses Note (Signed)
Port accessed per [employer]	[place], Therapist, sports. Labs drawn. Flushed easily. Swabs for flu and covid testing obtained per myself. Cardiac monitor and pulse ox in use. O2 at 2lpm via NC. Side rails up x 2. Call bell provided.

## 2019-04-13 NOTE — ED Triage Notes (Addendum)
Pt arrives [facility] 76; EMS called to scene for ams, lethargy for 3 days; EMS reports that pt SaO2 on scene was 84% Pt not oriented to time. EMS reports that family said pt has not been sleeping for the past two nights.

## 2019-04-13 NOTE — ED Nurses Note (Signed)
Report to Angie RN in ICU. Pt to 1710 via stretcher.

## 2019-04-14 LAB — CBC WITH DIFF
BASOPHIL #: <0.1 x10ˆ3/uL (ref <=0.20)
EOSINOPHIL #: 0.18 x10ˆ3/uL (ref <=0.50)
EOSINOPHIL %: 3 %
HCT: 28 % — ABNORMAL LOW (ref 34.8–46.0)
HGB: 9 g/dL — ABNORMAL LOW (ref 11.5–16.0)
IMMATURE GRANULOCYTE #: <0.1 x10ˆ3/uL (ref <0.10)
IMMATURE GRANULOCYTE %: 0 % (ref 0–1)
LYMPHOCYTE #: 1.07 x10ˆ3/uL (ref 1.00–4.80)
LYMPHOCYTE %: 15 %
MCH: 29.1 pg (ref 26.0–32.0)
MCHC: 32.1 g/dL (ref 31.0–35.5)
MCV: 90.6 fL (ref 78.0–100.0)
MONOCYTE #: 0.75 x10ˆ3/uL (ref 0.20–1.10)
MONOCYTE %: 11 %
MPV: 11.6 fL (ref 8.7–12.5)
NEUTROPHIL #: 4.9 x10ˆ3/uL (ref 1.50–7.70)
NEUTROPHIL %: 71 %
PLATELETS: 85 x10ˆ3/uL — ABNORMAL LOW (ref 150–400)
RBC: 3.09 10*6/uL — ABNORMAL LOW (ref 3.85–5.22)
RDW-CV: 13.7 % (ref 11.5–15.5)
WBC: 7 x10ˆ3/uL (ref 3.7–11.0)

## 2019-04-14 LAB — COMPREHENSIVE METABOLIC PANEL, NON-FASTING
ALBUMIN: 2.4 g/dL — ABNORMAL LOW (ref 3.5–5.0)
ALKALINE PHOSPHATASE: 171 U/L — ABNORMAL HIGH (ref 38–126)
ALT (SGPT): 80 U/L — ABNORMAL HIGH (ref 0–34)
ANION GAP: 6 mmol/L — ABNORMAL LOW (ref 8–16)
AST (SGOT): 98 U/L — ABNORMAL HIGH (ref 14–36)
BILIRUBIN TOTAL: 0.7 mg/dL (ref 0.2–1.3)
BUN/CREA RATIO: 24
BUN: 17 mg/dL (ref 7–17)
CALCIUM: 8.1 mg/dL — ABNORMAL LOW (ref 8.4–10.2)
CHLORIDE: 102 mmol/L (ref 98–107)
CO2 TOTAL: 24 mmol/L (ref 22–30)
CREATININE: 0.7 mg/dL (ref 0.52–1.04)
ESTIMATED GFR: >60 mL/min/1.73mˆ2 (ref >60)
GLUCOSE: 100 mg/dL (ref 74–106)
POTASSIUM: 3.7 mmol/L (ref 3.5–5.1)
PROTEIN TOTAL: 4.5 g/dL — ABNORMAL LOW (ref 6.3–8.2)
SODIUM: 132 mmol/L — ABNORMAL LOW (ref 137–145)
SODIUM: 132 mmol/L — ABNORMAL LOW (ref 137–145)

## 2019-04-14 LAB — GENTAMICIN, TROUGH, SERUM
GENTAMICIN TROUGH: 4.6 ug/mL (ref 0.6–2.0)
GENTAMICIN TROUGH: 3.1 ug/mL (ref 0.6–2.0)
GENTAMICIN TROUGH: 3.1 ug/mL (ref 0.6–2.0)

## 2019-04-14 MED ORDER — LIPASE-PROTEASE-AMYLASE 36,000-114,000-180,000 UNIT CAPSULE,DELAY REL
2.0000 | DELAYED_RELEASE_CAPSULE | Freq: Three times a day (TID) | ORAL | Status: DC
Start: 2019-04-14 — End: 2019-04-15
  Administered 2019-04-14 – 2019-04-15 (×3): 2 via ORAL

## 2019-04-14 MED ADMIN — hydrALAZINE 10 mg tablet: ORAL | @ 22:00:00

## 2019-04-14 MED ADMIN — lipase-protease-amylase 12,000-38,000-60,000 unit capsule,delayed rel: @ 12:00:00

## 2019-04-14 MED ADMIN — insulin lispro 100 unit/mL subcutaneous solution: SUBCUTANEOUS | @ 07:00:00

## 2019-04-14 MED ADMIN — sodium chloride 0.9 % intravenous solution: INTRAVENOUS | @ 07:00:00

## 2019-04-14 MED ADMIN — acetaminophen 325 mg tablet: ORAL | @ 20:00:00

## 2019-04-14 MED ADMIN — pantoprazole 40 mg tablet,delayed release: ORAL | @ 07:00:00

## 2019-04-14 MED ADMIN — sodium chloride 1 gram tablet: ORAL | @ 13:00:00

## 2019-04-14 MED ADMIN — losartan 50 mg tablet: ORAL | @ 08:00:00

## 2019-04-14 MED ADMIN — montelukast 10 mg tablet: ORAL | @ 08:00:00

## 2019-04-14 MED ADMIN — apixaban 5 mg tablet: ORAL | @ 08:00:00

## 2019-04-14 MED ADMIN — sodium chloride 0.9 % (flush) injection syringe: @ 13:00:00

## 2019-04-14 MED ADMIN — aspirin 81 mg tablet,delayed release: ORAL | @ 08:00:00

## 2019-04-14 MED ADMIN — cetirizine 10 mg tablet: ORAL | @ 09:00:00

## 2019-04-14 MED ADMIN — sodium chloride 0.9 % intravenous solution: INTRAVENOUS | @ 20:00:00

## 2019-04-14 MED ADMIN — Medication: INTRAVENOUS | @ 18:00:00

## 2019-04-14 MED ADMIN — insulin glargine 100 unit/mL subcutaneous solution: SUBCUTANEOUS | @ 22:00:00

## 2019-04-14 MED ADMIN — Medication: INTRAVENOUS | @ 02:00:00

## 2019-04-14 MED ADMIN — furosemide 40 mg tablet: ORAL | @ 08:00:00

## 2019-04-14 MED ADMIN — sodium chloride 0.9 % (flush) injection syringe: @ 22:00:00

## 2019-04-14 MED ADMIN — sodium chloride 0.9 % (flush) injection syringe: @ 07:00:00

## 2019-04-14 NOTE — Nurses Notes (Signed)
Not giving insulin or blood pressure medication because blood glucose & blood pressure are low. Awake. Alert. No s/s of distress.

## 2019-04-14 NOTE — Nurses Notes (Signed)
In room to give p.m. medication. Has been up to bsc. In bed now.

## 2019-04-14 NOTE — Nurses Notes (Signed)
Awake to go to commode. Aspirated blood for a.m. labs from [facility]. Pt. not in isolation- no covid-19.

## 2019-04-14 NOTE — Care Plan (Signed)
Give medication as ordered. Monitor vital signs. Help as much as possible.

## 2019-04-14 NOTE — Care Plan (Signed)
Problem: Adult Inpatient Plan of Care  Goal: Patient-Specific Goal (Individualized)  Outcome: Ongoing (see interventions/notes)  Flowsheets (Taken 04/14/2019 0459 by Theressa Millard, RN)  Patient-Specific Goals (Include Timeframe): home asap  Note: Interventions: IV antibiotics and gentamycin monitoring, labs.   Results.  WBC improving, afebrile, gent levels elevated. Physician discussed with pt x6 more days of IV antibiotics.  Sherilyn Banker, RN

## 2019-04-14 NOTE — Nurses Notes (Signed)
In bed. Alert. Oriented. C/o headache, tylenol, 650mg s.,p.o.

## 2019-04-14 NOTE — Nurses Notes (Signed)
Resting in bed. Tring to sleep. No s/s of distress.

## 2019-04-14 NOTE — Nurses Notes (Signed)
Up to bedside ,then back to bed. Alert. Oriented. Watching TV. No s/s of distress.

## 2019-04-14 NOTE — Progress Notes (Signed)
[facility]  Family Medicine  Progress Note    Date of Service:  04/14/2019  [name], [name], 74 y.o. female  Date of Admission:  04/13/2019  Date of Birth:  1946-06-14  PCP: Ignacia Felling, MD    HPI:  Patient is more alert and doing well.  NO fever.  She has blood cultures that are growing gram negative rods.  She denies pain.   She COVID-19 negative.    acetaminophen (TYLENOL) tablet, 650 mg, Oral, Q4H PRN  apixaban (ELIQUIS) tablet, 2.5 mg, Oral, 2x/day  aspirin (ECOTRIN) enteric coated tablet 81 mg, 81 mg, Oral, Daily  atorvastatin (LIPITOR) tablet, 10 mg, Oral, QPM  cetirizine (ZYRTEC) tablet, 10 mg, Oral, Daily  dextrose 50% (0.5 g/mL) injection - syringe, 12.5 g, Intravenous, Q15 Min PRN  furosemide (LASIX) tablet, 40 mg, Oral, Daily  gentamicin (GARAMYCIN) 120 mg in NS 100 mL IVPB, 2 mg/kg (Adjusted), Intravenous, Q8H  hydrALAZINE (APRESOLINE) tablet, 10 mg, Oral, Q8HRS  HYDROcodone-acetaminophen (NORCO) 5-325 mg per tablet, 1 Tab, Oral, Q4H PRN  hydrocortisone-pramoxine (PROCTOFOAM HC) 1-1% rectal foam, 1 Applicator, Rectal, 2x/day PRN  insulin glargine (LANTUS) 100 units/mL injection, 10 Units, Subcutaneous, QPM  insulin lispro (HUMALOG) 100 units/mL injection, 5 Units, Subcutaneous, 2x/day AC  LORazepam (ATIVAN) tablet, 0.5 mg, Oral, Q4H PRN  losartan (COZAAR) tablet, 100 mg, Oral, Daily  magnesium hydroxide (MILK OF MAGNESIA) 400mg  per 36mL oral liquid, 15 mL, Oral, Daily PRN  metoprolol succinate (TOPROL-XL) 24 hr extended release tablet, 25 mg, Oral, Daily  montelukast (SINGULAIR) 10 mg tablet, 10 mg, Oral, Daily  NS bolus infusion 50 mL, 50 mL, Intravenous, Give in Radiology  NS flush syringe, 10 mL, Intracatheter, Q8HRS  NS flush syringe, 10 mL, Intracatheter, Q1H PRN  NS premix infusion, , Intravenous, Continuous  ondansetron (ZOFRAN ODT) rapid dissolve tablet, 4 mg, Oral, Q6H PRN  ondansetron (ZOFRAN) 2 mg/mL injection, 4 mg, Intravenous, Q6H PRN  pancreatic enzyme replacement  (CREON) 12,000 units lipase per cap, 2 Cap, Oral, 3x/day-Meals  pantoprazole (PROTONIX) delayed release tablet, 40 mg, Oral, Daily  sodium chloride tablet, 1 g, Oral, 3x/day-Meals  SSIP insulin lispro (HUMALOG) 100 units/mL injection, 0-12 Units, Subcutaneous, 4x/day PRN         Allergies   Allergen Reactions   . Sulfa (Sulfonamides) Itching             Filed Vitals:    04/14/19 0750 04/14/19 0810 04/14/19 0815 04/14/19 0817   BP:   (!) 136/45    Pulse:   87 87   Resp:   18    Temp:   36.6 C (97.8 F)    SpO2: 98% 98%         Review of Systems   Constitutional: Negative for chills and fever.   Respiratory: Negative for cough and hemoptysis.    Cardiovascular: Negative for chest pain.   Gastrointestinal: Negative for abdominal pain, diarrhea, nausea and vomiting.   Genitourinary: Negative for dysuria.       Physical Exam   Constitutional: She is oriented to person, place, and time and well-developed, well-nourished, and in no distress. No distress.   Neck: Normal range of motion. Neck supple. No JVD present. No tracheal deviation present. No thyromegaly present.   Cardiovascular: Normal rate, regular rhythm and normal heart sounds. Exam reveals no friction rub.   No murmur heard.  Pulmonary/Chest: Effort normal and breath sounds normal. No stridor. No respiratory distress. She has no wheezes. She has no rales.  Abdominal: Soft. Bowel sounds are normal. She exhibits no distension. There is no abdominal tenderness. There is no rebound and no guarding.   Musculoskeletal:         General: Edema present.   Neurological: She is alert and oriented to person, place, and time.   Skin: Skin is warm and dry. She is not diaphoretic.       Laboratory Data:     Results for orders placed or performed during the hospital encounter of 04/13/19 (from the past 24 hour(s))   COMPREHENSIVE METABOLIC PANEL, NON-FASTING   Result Value Ref Range    SODIUM 132 (L) 137 - 145 mmol/L    POTASSIUM 3.7 3.5 - 5.1 mmol/L    CHLORIDE 102 98 - 107  mmol/L    CO2 TOTAL 24 22 - 30 mmol/L    ANION GAP 6 (L) 8 - 16 mmol/L    BUN 17 7 - 17 mg/dL    CREATININE 0.70 0.52 - 1.04 mg/dL    BUN/CREA RATIO 24     ESTIMATED GFR >60 >60 mL/min/1.41m^2    ALBUMIN 2.4 (L) 3.5 - 5.0 g/dL    CALCIUM 8.1 (L) 8.4 - 10.2 mg/dL    GLUCOSE 100 74 - 106 mg/dL    ALKALINE PHOSPHATASE 171 (H) 38 - 126 U/L    ALT (SGPT) 80 (H) 0 - 34 U/L    AST (SGOT) 98 (H) 14 - 36 U/L    BILIRUBIN TOTAL 0.7 0.2 - 1.3 mg/dL    PROTEIN TOTAL 4.5 (L) 6.3 - 8.2 g/dL   CBC WITH DIFF   Result Value Ref Range    WBC 7.0 3.7 - 11.0 x10^3/uL    RBC 3.09 (L) 3.85 - 5.22 x10^6/uL    HGB 9.0 (L) 11.5 - 16.0 g/dL    HCT 28.0 (L) 34.8 - 46.0 %    MCV 90.6 78.0 - 100.0 fL    MCH 29.1 26.0 - 32.0 pg    MCHC 32.1 31.0 - 35.5 g/dL    RDW-CV 13.7 11.5 - 15.5 %    PLATELETS 85 (L) 150 - 400 x10^3/uL    MPV 11.6 8.7 - 12.5 fL    NEUTROPHIL % 71 %    LYMPHOCYTE % 15 %    MONOCYTE % 11 %    EOSINOPHIL % 3 %    BASOPHIL % 0 %    NEUTROPHIL # 4.90 1.50 - 7.70 x10^3/uL    LYMPHOCYTE # 1.07 1.00 - 4.80 x10^3/uL    MONOCYTE # 0.75 0.20 - 1.10 x10^3/uL    EOSINOPHIL # 0.18 <=0.50 x10^3/uL    BASOPHIL # <0.10 <=0.20 x10^3/uL    IMMATURE GRANULOCYTE % 0 0 - 1 %    IMMATURE GRANULOCYTE # <0.10 <0.10 x10^3/uL       Imaging Studies:    CT ANGIO CHEST FOR PULMONARY EMBOLUS W IV CONTRAST   Final Result   No evidence for pulmonary embolism. No acute infiltrate or   effusion   Tiny lung nodules right middle lobe and lingular area, I would suggest   follow-up to exclude metastatic disease considering patient's history of   carcinoma.   Abnormal esophagus with fluid filling distention and wall thickening.   Small pericardial effusion         The CT exam was performed using one or more the following a dose reduction   techniques: Automated exposure control, adjustment of the mA and/or kV   according to the patient's size, or use of  iterative reconstruction   technique.            Radiologist location ID: [id number]         CT BRAIN WO IV  CONTRAST   Final Result   Chronic appearing change without evidence for acute finding as   above         The CT exam was performed using one or more the following a dose reduction   techniques: Automated exposure control, adjustment of the mA and/or kV   according to the patient's size, or use of iterative reconstruction   technique.            Radiologist location ID: [id number]             Assessment/Plan:  Hospital Problems    1Urinary tract infection         Date Noted: 04/13/2019      COPD (chronic obstructive pulmonary disease) (CMS [place])         Date Noted: 04/13/2019      Essential hypertension         Date Noted: 04/13/2019      CHF (congestive heart failure) (CMS HCC)         Date Noted: 04/13/2019      DNR (do not resuscitate)         Date Noted: 04/13/2019      Hyperlipidemia         Date Noted: 04/13/2019      SIADH (syndrome of inappropriate ADH production) (CMS [facility])         Date Noted: 04/13/2019      Resolved Hospital Problems  No resolved problems to display.      Continue IV antibiotics.  Consult social service for possible swingbed to finish her 7 days of IV antibiotics.    Myrene Buddy, MD

## 2019-04-15 ENCOUNTER — Encounter (HOSPITAL_COMMUNITY): Payer: Self-pay

## 2019-04-15 ENCOUNTER — Ambulatory Visit: Payer: Medicare Other | Attending: FAMILY MEDICINE | Admitting: FAMILY MEDICINE

## 2019-04-15 ENCOUNTER — Other Ambulatory Visit: Payer: Self-pay

## 2019-04-15 ENCOUNTER — Inpatient Hospital Stay
Admission: AD | Admit: 2019-04-15 | Discharge: 2019-04-19 | DRG: 872 | Disposition: A | Discharge status: Home Health | Payer: Medicare Other | Source: Intra-hospital | Attending: FAMILY MEDICINE | Admitting: FAMILY MEDICINE

## 2019-04-15 DIAGNOSIS — R531 Weakness: Secondary | ICD-10-CM | POA: Diagnosis present

## 2019-04-15 DIAGNOSIS — Z66 Do not resuscitate: Secondary | ICD-10-CM | POA: Insufficient documentation

## 2019-04-15 DIAGNOSIS — R51 Headache: Secondary | ICD-10-CM | POA: Insufficient documentation

## 2019-04-15 DIAGNOSIS — I2699 Other pulmonary embolism without acute cor pulmonale: Secondary | ICD-10-CM

## 2019-04-15 DIAGNOSIS — I4891 Unspecified atrial fibrillation: Secondary | ICD-10-CM | POA: Diagnosis present

## 2019-04-15 DIAGNOSIS — B001 Herpesviral vesicular dermatitis: Secondary | ICD-10-CM | POA: Diagnosis present

## 2019-04-15 DIAGNOSIS — J45909 Unspecified asthma, uncomplicated: Secondary | ICD-10-CM

## 2019-04-15 DIAGNOSIS — Z87891 Personal history of nicotine dependence: Secondary | ICD-10-CM | POA: Insufficient documentation

## 2019-04-15 DIAGNOSIS — J449 Chronic obstructive pulmonary disease, unspecified: Secondary | ICD-10-CM | POA: Diagnosis present

## 2019-04-15 DIAGNOSIS — E785 Hyperlipidemia, unspecified: Secondary | ICD-10-CM | POA: Diagnosis present

## 2019-04-15 DIAGNOSIS — R601 Generalized edema: Secondary | ICD-10-CM

## 2019-04-15 DIAGNOSIS — Z79899 Other long term (current) drug therapy: Secondary | ICD-10-CM

## 2019-04-15 DIAGNOSIS — Z9221 Personal history of antineoplastic chemotherapy: Secondary | ICD-10-CM

## 2019-04-15 DIAGNOSIS — E222 Syndrome of inappropriate secretion of antidiuretic hormone: Secondary | ICD-10-CM | POA: Insufficient documentation

## 2019-04-15 DIAGNOSIS — I10 Essential (primary) hypertension: Secondary | ICD-10-CM | POA: Insufficient documentation

## 2019-04-15 DIAGNOSIS — K219 Gastro-esophageal reflux disease without esophagitis: Secondary | ICD-10-CM | POA: Diagnosis present

## 2019-04-15 DIAGNOSIS — Z794 Long term (current) use of insulin: Secondary | ICD-10-CM

## 2019-04-15 DIAGNOSIS — E119 Type 2 diabetes mellitus without complications: Secondary | ICD-10-CM | POA: Insufficient documentation

## 2019-04-15 DIAGNOSIS — E7800 Pure hypercholesterolemia, unspecified: Secondary | ICD-10-CM | POA: Diagnosis present

## 2019-04-15 DIAGNOSIS — C259 Malignant neoplasm of pancreas, unspecified: Secondary | ICD-10-CM | POA: Diagnosis present

## 2019-04-15 DIAGNOSIS — F419 Anxiety disorder, unspecified: Secondary | ICD-10-CM | POA: Diagnosis present

## 2019-04-15 DIAGNOSIS — Z7901 Long term (current) use of anticoagulants: Secondary | ICD-10-CM

## 2019-04-15 DIAGNOSIS — B9620 Unspecified Escherichia coli [E. coli] as the cause of diseases classified elsewhere: Secondary | ICD-10-CM | POA: Insufficient documentation

## 2019-04-15 DIAGNOSIS — S31109A Unspecified open wound of abdominal wall, unspecified quadrant without penetration into peritoneal cavity, initial encounter: Secondary | ICD-10-CM

## 2019-04-15 DIAGNOSIS — Z923 Personal history of irradiation: Secondary | ICD-10-CM

## 2019-04-15 DIAGNOSIS — R918 Other nonspecific abnormal finding of lung field: Secondary | ICD-10-CM

## 2019-04-15 DIAGNOSIS — Z9049 Acquired absence of other specified parts of digestive tract: Secondary | ICD-10-CM | POA: Insufficient documentation

## 2019-04-15 DIAGNOSIS — N390 Urinary tract infection, site not specified: Secondary | ICD-10-CM | POA: Insufficient documentation

## 2019-04-15 DIAGNOSIS — A4151 Sepsis due to Escherichia coli [E. coli]: Principal | ICD-10-CM | POA: Diagnosis present

## 2019-04-15 DIAGNOSIS — A419 Sepsis, unspecified organism: Secondary | ICD-10-CM

## 2019-04-15 DIAGNOSIS — Z882 Allergy status to sulfonamides status: Secondary | ICD-10-CM

## 2019-04-15 DIAGNOSIS — Z7982 Long term (current) use of aspirin: Secondary | ICD-10-CM

## 2019-04-15 DIAGNOSIS — I509 Heart failure, unspecified: Secondary | ICD-10-CM

## 2019-04-15 DIAGNOSIS — E876 Hypokalemia: Secondary | ICD-10-CM

## 2019-04-15 DIAGNOSIS — R519 Headache, unspecified: Secondary | ICD-10-CM | POA: Diagnosis present

## 2019-04-15 DIAGNOSIS — E1142 Type 2 diabetes mellitus with diabetic polyneuropathy: Secondary | ICD-10-CM | POA: Diagnosis present

## 2019-04-15 DIAGNOSIS — E871 Hypo-osmolality and hyponatremia: Secondary | ICD-10-CM

## 2019-04-15 DIAGNOSIS — F329 Major depressive disorder, single episode, unspecified: Secondary | ICD-10-CM | POA: Diagnosis present

## 2019-04-15 DIAGNOSIS — Z86711 Personal history of pulmonary embolism: Secondary | ICD-10-CM

## 2019-04-15 HISTORY — DX: Sepsis, unspecified organism (CMS HCC): A41.9

## 2019-04-15 LAB — CBC WITH DIFF
BASOPHIL #: <0.1 10*3/uL (ref <=0.20)
BASOPHIL %: 1 %
BASOPHIL %: 1 %
EOSINOPHIL #: 0.18 x10ˆ3/uL (ref <=0.50)
EOSINOPHIL %: 5 %
HCT: 28.9 % — ABNORMAL LOW (ref 34.8–46.0)
HGB: 9.1 g/dL — ABNORMAL LOW (ref 11.5–16.0)
IMMATURE GRANULOCYTE #: <0.1 x10ˆ3/uL (ref <0.10)
IMMATURE GRANULOCYTE %: 1 % (ref 0–1)
LYMPHOCYTE #: 0.7 x10ˆ3/uL — ABNORMAL LOW (ref 1.00–4.80)
LYMPHOCYTE %: 18 %
MCH: 28.7 pg (ref 26.0–32.0)
MCHC: 31.5 g/dL (ref 31.0–35.5)
MCV: 91.2 fL (ref 78.0–100.0)
MONOCYTE #: 0.47 x10ˆ3/uL (ref 0.20–1.10)
MONOCYTE %: 12 %
MPV: 12.3 fL (ref 8.7–12.5)
NEUTROPHIL #: 2.43 x10ˆ3/uL (ref 1.50–7.70)
NEUTROPHIL %: 63 %
PLATELETS: 85 x10ˆ3/uL — ABNORMAL LOW (ref 150–400)
RBC: 3.17 x10ˆ6/uL — ABNORMAL LOW (ref 3.85–5.22)
RDW-CV: 13.4 % (ref 11.5–15.5)
WBC: 3.8 x10ˆ3/uL (ref 3.7–11.0)

## 2019-04-15 LAB — BASIC METABOLIC PANEL, FASTING
ANION GAP: 5 mmol/L — ABNORMAL LOW (ref 8–16)
BUN/CREA RATIO: 23
BUN: 18 mg/dL — ABNORMAL HIGH (ref 7–17)
CALCIUM: 8.1 mg/dL — ABNORMAL LOW (ref 8.4–10.2)
CHLORIDE: 104 mmol/L (ref 98–107)
CO2 TOTAL: 24 mmol/L (ref 22–30)
CREATININE: 0.8 mg/dL (ref 0.52–1.04)
ESTIMATED GFR: >60 mL/min/1.73mˆ2 (ref >60)
GLUCOSE: 204 mg/dL — ABNORMAL HIGH (ref 74–106)
POTASSIUM: 4.4 mmol/L (ref 3.5–5.1)
SODIUM: 133 mmol/L — ABNORMAL LOW (ref 137–145)

## 2019-04-15 LAB — URINE CULTURE,ROUTINE: URINE CULTURE: >100000 — AB

## 2019-04-15 LAB — ADULT ROUTINE BLOOD CULTURE, SET OF 2 BOTTLES (BACTERIA AND YEAST)

## 2019-04-15 LAB — GENTAMICIN, TROUGH, SERUM: GENTAMICIN TROUGH: 1.7 ug/mL (ref 0.6–2.0)

## 2019-04-15 MED ORDER — SODIUM CHLORIDE 0.9 % (FLUSH) INJECTION SYRINGE
10.0000 mL | INJECTION | Freq: Three times a day (TID) | INTRAMUSCULAR | Status: DC
Start: 2019-04-15 — End: 2019-04-19
  Administered 2019-04-15: 0 mL
  Administered 2019-04-15 – 2019-04-17 (×5): 10 mL
  Administered 2019-04-17: 0 mL
  Administered 2019-04-17 – 2019-04-19 (×5): 10 mL

## 2019-04-15 MED ORDER — ASPIRIN 81 MG TABLET,DELAYED RELEASE
81.0000 mg | DELAYED_RELEASE_TABLET | Freq: Every day | ORAL | Status: DC
Start: 2019-04-15 — End: 2019-04-19
  Administered 2019-04-15: 0 mg via ORAL
  Administered 2019-04-16 – 2019-04-19 (×4): 81 mg via ORAL
  Filled 2019-04-15 (×4): qty 1

## 2019-04-15 MED ORDER — MONTELUKAST 10 MG TABLET
10.00 mg | ORAL_TABLET | Freq: Every evening | ORAL | Status: DC
Start: 2019-04-15 — End: 2019-04-19
  Administered 2019-04-15 – 2019-04-18 (×4): 10 mg via ORAL
  Filled 2019-04-15 (×4): qty 1

## 2019-04-15 MED ORDER — INSULIN GLARGINE (U-100) 100 UNIT/ML (3 ML) SUBCUTANEOUS PEN
8.00 [IU] | PEN_INJECTOR | Freq: Every evening | SUBCUTANEOUS | Status: DC
Start: 2019-04-15 — End: 2019-04-19
  Administered 2019-04-15 – 2019-04-18 (×4): 0 [IU] via SUBCUTANEOUS
  Filled 2019-04-15: qty 3

## 2019-04-15 MED ORDER — METOPROLOL SUCCINATE ER 25 MG TABLET,EXTENDED RELEASE 24 HR
25.0000 mg | ORAL_TABLET | Freq: Every day | ORAL | Status: DC
Start: 2019-04-15 — End: 2019-04-19
  Administered 2019-04-15: 0 mg via ORAL
  Administered 2019-04-16 – 2019-04-19 (×4): 25 mg via ORAL
  Filled 2019-04-15 (×4): qty 1

## 2019-04-15 MED ORDER — LOSARTAN 50 MG TABLET
100.0000 mg | ORAL_TABLET | Freq: Every day | ORAL | Status: DC
Start: 2019-04-15 — End: 2019-04-19
  Administered 2019-04-15: 0 mg via ORAL
  Administered 2019-04-16 – 2019-04-19 (×4): 100 mg via ORAL
  Filled 2019-04-15 (×4): qty 2

## 2019-04-15 MED ORDER — APIXABAN 5 MG TABLET
2.50 mg | ORAL_TABLET | Freq: Two times a day (BID) | ORAL | Status: DC
Start: 2019-04-15 — End: 2019-04-19
  Administered 2019-04-15 – 2019-04-19 (×8): 2.5 mg via ORAL
  Filled 2019-04-15 (×8): qty 1

## 2019-04-15 MED ORDER — DEXTROSE 50 % IN WATER (D50W) INTRAVENOUS SYRINGE
12.50 g | INJECTION | INTRAVENOUS | Status: DC | PRN
Start: 2019-04-15 — End: 2019-04-19

## 2019-04-15 MED ORDER — SODIUM CHLORIDE 0.9 % INTRAVENOUS SOLUTION
5.0000 mg/kg | INTRAVENOUS | Status: DC
Start: 2019-04-15 — End: 2019-04-15
  Administered 2019-04-15: 240 mg via INTRAVENOUS
  Administered 2019-04-15: 0 mg via INTRAVENOUS
  Filled 2019-04-15 (×2): qty 6

## 2019-04-15 MED ORDER — SODIUM CHLORIDE 0.9 % INTRAVENOUS SOLUTION
5.00 mg/kg | INTRAVENOUS | Status: DC
Start: 2019-04-16 — End: 2019-04-19

## 2019-04-15 MED ORDER — SODIUM CHLORIDE 0.9 % INTRAVENOUS SOLUTION
5.00 mg/kg | INTRAVENOUS | Status: DC
Start: 2019-04-16 — End: 2019-04-16
  Administered 2019-04-16: 09:00:00 0 mg via INTRAVENOUS

## 2019-04-15 MED ORDER — FUROSEMIDE 40 MG TABLET
40.0000 mg | ORAL_TABLET | Freq: Every day | ORAL | Status: DC
Start: 2019-04-15 — End: 2019-04-19
  Administered 2019-04-15: 0 mg via ORAL
  Administered 2019-04-16 – 2019-04-19 (×4): 40 mg via ORAL
  Filled 2019-04-15 (×4): qty 1

## 2019-04-15 MED ORDER — CETIRIZINE 10 MG TABLET
10.0000 mg | ORAL_TABLET | Freq: Every day | ORAL | Status: DC
Start: 2019-04-15 — End: 2019-04-19
  Administered 2019-04-15: 0 mg via ORAL
  Administered 2019-04-16 – 2019-04-19 (×4): 10 mg via ORAL

## 2019-04-15 MED ORDER — HYDRALAZINE 10 MG TABLET
10.00 mg | ORAL_TABLET | Freq: Three times a day (TID) | ORAL | Status: DC
Start: 2019-04-15 — End: 2019-04-19
  Administered 2019-04-15: 10 mg via ORAL
  Administered 2019-04-15: 15:00:00 0 mg via ORAL
  Administered 2019-04-16 – 2019-04-19 (×10): 10 mg via ORAL
  Administered 2019-04-19: 14:00:00
  Filled 2019-04-15 (×11): qty 1

## 2019-04-15 MED ORDER — INSULIN LISPRO 100 UNIT/ML SUB-Q SSIP - RMH
0.00 [IU] | INJECTION | Freq: Four times a day (QID) | SUBCUTANEOUS | Status: DC | PRN
Start: 2019-04-15 — End: 2019-04-19
  Administered 2019-04-15: 4 [IU] via SUBCUTANEOUS
  Administered 2019-04-16: 6 [IU] via SUBCUTANEOUS
  Administered 2019-04-16: 4 [IU] via SUBCUTANEOUS
  Administered 2019-04-17: 6 [IU] via SUBCUTANEOUS
  Administered 2019-04-17: 20:00:00 4 [IU] via SUBCUTANEOUS
  Administered 2019-04-18: 12:00:00 6 [IU] via SUBCUTANEOUS
  Filled 2019-04-15: qty 6
  Filled 2019-04-15 (×2): qty 3
  Filled 2019-04-15 (×2): qty 6
  Filled 2019-04-15: qty 3

## 2019-04-15 MED ORDER — ACETAMINOPHEN 325 MG TABLET
650.00 mg | ORAL_TABLET | ORAL | Status: DC | PRN
Start: 2019-04-15 — End: 2019-04-19

## 2019-04-15 MED ORDER — SODIUM CHLORIDE 0.9 % (FLUSH) INJECTION SYRINGE
10.0000 mL | INJECTION | INTRAMUSCULAR | Status: DC | PRN
Start: 2019-04-15 — End: 2019-04-19

## 2019-04-15 MED ORDER — INSULIN LISPRO 100 UNIT/ML SUB-Q SSIP - RMH
0.00 [IU] | INJECTION | Freq: Four times a day (QID) | SUBCUTANEOUS | Status: DC | PRN
Start: 2019-04-15 — End: 2019-04-19

## 2019-04-15 MED ORDER — INSULIN LISPRO 100 UNIT/ML SUB-Q - CHARGE BY DOSE
5.00 [IU] | Freq: Two times a day (BID) | SUBCUTANEOUS | Status: DC
Start: 2019-04-15 — End: 2019-04-19
  Administered 2019-04-15: 5 [IU] via SUBCUTANEOUS
  Administered 2019-04-16: 0 [IU] via SUBCUTANEOUS
  Administered 2019-04-16 – 2019-04-19 (×6): 5 [IU] via SUBCUTANEOUS
  Filled 2019-04-15 (×7): qty 5

## 2019-04-15 MED ORDER — ACETAMINOPHEN 325 MG TABLET
650.00 mg | ORAL_TABLET | ORAL | 0 refills | Status: DC | PRN
Start: 2019-04-15 — End: 2019-04-19

## 2019-04-15 MED ORDER — MAGNESIUM HYDROXIDE 400 MG/5 ML ORAL SUSPENSION
15.00 mL | Freq: Every day | ORAL | Status: DC | PRN
Start: 2019-04-15 — End: 2019-04-19

## 2019-04-15 MED ORDER — PANTOPRAZOLE 40 MG TABLET,DELAYED RELEASE
40.00 mg | DELAYED_RELEASE_TABLET | Freq: Every day | ORAL | Status: DC
Start: 2019-04-15 — End: 2019-04-19
  Administered 2019-04-15: 0 mg via ORAL
  Administered 2019-04-16 – 2019-04-19 (×5): 40 mg via ORAL
  Filled 2019-04-15 (×4): qty 1

## 2019-04-15 MED ORDER — ONDANSETRON HCL (PF) 4 MG/2 ML INJECTION SOLUTION
4.00 mg | Freq: Four times a day (QID) | INTRAMUSCULAR | Status: DC | PRN
Start: 2019-04-15 — End: 2019-04-19

## 2019-04-15 MED ORDER — HYDROCODONE 5 MG-ACETAMINOPHEN 325 MG TABLET
1.00 | ORAL_TABLET | ORAL | Status: DC | PRN
Start: 2019-04-15 — End: 2019-04-19

## 2019-04-15 MED ORDER — SODIUM CHLORIDE 1 GRAM TABLET
1.00 g | ORAL_TABLET | Freq: Three times a day (TID) | ORAL | Status: DC
Start: 2019-04-15 — End: 2019-04-19
  Administered 2019-04-15 – 2019-04-19 (×11): 1 g via ORAL
  Filled 2019-04-15 (×11): qty 1

## 2019-04-15 MED ORDER — LIPASE-PROTEASE-AMYLASE 36,000-114,000-180,000 UNIT CAPSULE,DELAY REL
2.00 | DELAYED_RELEASE_CAPSULE | Freq: Three times a day (TID) | ORAL | Status: DC
Start: 2019-04-15 — End: 2019-04-19
  Administered 2019-04-15 – 2019-04-19 (×11): 2 via ORAL
  Administered 2019-04-19: 12:00:00

## 2019-04-15 MED ORDER — ATORVASTATIN 10 MG TABLET
10.00 mg | ORAL_TABLET | Freq: Every evening | ORAL | Status: DC
Start: 2019-04-15 — End: 2019-04-19
  Administered 2019-04-15 – 2019-04-18 (×4): 10 mg via ORAL
  Filled 2019-04-15 (×4): qty 1

## 2019-04-15 MED ORDER — GENTAMICIN 40 MG/ML INJECTION SOLUTION
2.00 mg/kg | Freq: Three times a day (TID) | INTRAMUSCULAR | Status: DC
Start: 2019-04-15 — End: 2019-04-15

## 2019-04-15 MED ADMIN — metoprolol succinate ER 25 mg tablet,extended release 24 hr: ORAL | @ 15:00:00

## 2019-04-15 MED ADMIN — aspirin 81 mg tablet,delayed release: ORAL | @ 15:00:00

## 2019-04-15 MED ADMIN — sodium chloride 0.9 % (flush) injection syringe: @ 15:00:00

## 2019-04-15 MED ADMIN — apixaban 5 mg tablet: ORAL | @ 09:00:00

## 2019-04-15 MED ADMIN — hydrALAZINE 10 mg tablet: ORAL | @ 14:00:00

## 2019-04-15 MED ADMIN — furosemide 40 mg tablet: ORAL | @ 15:00:00

## 2019-04-15 MED ADMIN — pantoprazole 40 mg tablet,delayed release: ORAL | @ 15:00:00

## 2019-04-15 MED ADMIN — sodium chloride 1 gram tablet: ORAL | @ 13:00:00

## 2019-04-15 MED ADMIN — insulin lispro 100 unit/mL subcutaneous solution: SUBCUTANEOUS | @ 21:00:00

## 2019-04-15 MED ADMIN — lipase-protease-amylase 36,000-114,000-180,000 unit capsule,delay rel: ORAL | @ 09:00:00

## 2019-04-15 MED ADMIN — sodium chloride 0.9 % (flush) injection syringe: @ 07:00:00

## 2019-04-15 MED ADMIN — hydrALAZINE 10 mg tablet: ORAL | @ 07:00:00

## 2019-04-15 MED ADMIN — Medication: @ 10:00:00

## 2019-04-15 MED ADMIN — insulin lispro 100 unit/mL subcutaneous solution: SUBCUTANEOUS | @ 07:00:00

## 2019-04-15 MED ADMIN — lipase-protease-amylase 36,000-114,000-180,000 unit capsule,delay rel: ORAL | @ 17:00:00

## 2019-04-15 MED ADMIN — insulin lispro 100 unit/mL subcutaneous solution: SUBCUTANEOUS | @ 17:00:00

## 2019-04-15 MED ADMIN — Medication: INTRAVENOUS | @ 09:00:00

## 2019-04-15 MED ADMIN — Medication: INTRAVENOUS | @ 10:00:00

## 2019-04-15 MED ADMIN — aspirin 81 mg tablet,delayed release: ORAL | @ 09:00:00

## 2019-04-15 MED ADMIN — sodium chloride 0.9 % (flush) injection syringe: @ 21:00:00

## 2019-04-15 MED ADMIN — insulin glargine (U-100) 100 unit/mL (3 mL) subcutaneous pen: SUBCUTANEOUS | @ 21:00:00

## 2019-04-15 MED ADMIN — sodium chloride 1 gram tablet: ORAL | @ 09:00:00

## 2019-04-15 MED ADMIN — cetirizine 10 mg tablet: ORAL | @ 15:00:00

## 2019-04-15 MED ADMIN — furosemide 40 mg tablet: ORAL | @ 09:00:00

## 2019-04-15 MED ADMIN — sodium chloride 1 gram tablet: ORAL | @ 17:00:00

## 2019-04-15 MED ADMIN — cetirizine 10 mg tablet: ORAL | @ 09:00:00

## 2019-04-15 NOTE — Nurses Notes (Signed)
Awake for me to aspirate blood from mediport for a.m. values. No s/s of distress.

## 2019-04-15 NOTE — Discharge Instructions (Signed)
Return if condition worsens or reoccurs.  You you will placed in swing bed as discussed for IV antibiotics for the next several days.    Report any changes or problems to nursing staff.

## 2019-04-15 NOTE — Care Management Notes (Signed)
04/15/19 1325   Assessment Details   Assessment Type Admission   Date of Care Management Update 04/15/19   Readmission   Is this a readmission? No   Medicare Intent to Discharge Documentation   Admit IMM given to: Patient   Admit IMM letter given date 04/13/19   Admit IMM letter time given 1803   IMM explained/reviewed with:  Patient   Care Management Plan   Discharge Planning Status initial meeting   Projected Discharge Date 04/15/19   Discharge plan discussed with: Patient   CM will evaluate for rehabilitation potential yes   Patient choice offered to patient/family yes   Facility or Agency Preferences Is going to [facility] for strengthening and IV antibiotics    Discharge Needs Assessment   Equipment Currently Used at Home cane, straight;other (see comments)  (rollator )   Equipment Needed After Discharge none   Discharge Facility/Level of Care Needs Swing Bed-Hospital Based (code 74)  (states agreeable to home health at discharge )   Transportation Available car;family or friend will provide   Referral Information   Admission Type inpatient   Address Verified verified-no changes   Arrived From home or self-care   Insurance Verified verified-no change   ADVANCE DIRECTIVES   Does the Patient have an Advance Directive? Yes, Patient Does Have Advance Directive for Healthcare Treatment   Type of Advance Directive Completed Medical Power of Attorney   Copy of Advance Directives in Chart? 6   Name of MPOA or [place] - sister    Phone Number of MPOA or Healthcare Surrogate [phone]   Patient Requests Assistance in Having Advance Directive Notarized. N/A   LAY CAREGIVER    Appointed Lay Caregiver? I Decline   Employment/Financial   Patient has Prescription Coverage?  Yes        Name of Insurance Coverage for Medications Aetna    Financial Concerns none   Mutuality/Individual Preferences    Patient-Specific Goals (Include Timeframe) To be able to move around more and be stronger    Anxieties,  Fears or Concerns denies    Individualized Care Needs assist as needed with mobility    Living Environment   Select an age group to open "lives with" row.  Adult   Lives With sibling(s)   Living Arrangements house   Able to Return to Prior Arrangements yes   Home Safety   Home Assessment: No Problems Identified   Home Accessibility no concerns;bed and bath on same level;ramps present at home   Legal Issues   Do you have a court appointed guardian/conservator? No   Patient Hand-Off   Clinical/Discharge Plan of Care Information Communicated to:  Clinical Care Coordinator

## 2019-04-15 NOTE — Nurses Notes (Signed)
Awake to take p.o. medication. No s/s of distress.

## 2019-04-15 NOTE — Nurses Notes (Signed)
Patient brought to Med-Surg unit from ICU arrived via wheelchair. Patient is Alert and Orientated. Able to participate in admission database. Skin intact with no area of concern. Oriented to room and call light. Advised to ring for assistance to reduce risk of fall. Patient agreeable.

## 2019-04-15 NOTE — Nurses Notes (Signed)
Pt given verbal and written discharge instructions.  Verbalizes understanding.  No questions or complaints. Sherilyn Banker, RN

## 2019-04-15 NOTE — H&P (Signed)
[facility]  Family Medicine  History & Physical    Date of Service:  04/15/2019  [name], [name], 73 y.o. female  Date of Admission:  (Not on file)  Date of Birth:  May 20, 1946  PCP: Ignacia Felling, MD  Code Status:    Code Status Information     Code Status    No CPR           Chief Complaint:  Swing Bed placement to finish IV abtics.    HPI:  [name] is a 73 y.o. White female who is admitted for completion of IV abtics for E coli UTI and sepsis.    Past Medical History:   Diagnosis Date   . Abdominal hernia 10/18/2017    hx of repair   . Anxiety    . Arthritis    . Asthma    . Atrial fibrillation (CMS HCC)    . Back problem    . Bruises easily    . Cancer (CMS [place]) 10/18/2017    chemo and radiation completed 09/24/2017   . COPD (chronic obstructive pulmonary disease) (CMS HCC)    . CPAP (continuous positive airway pressure) dependence 10/18/2017    has not used recently   . Depression    . DM (diabetes mellitus) (CMS HCC) 2000    Fasting BG 200's. HGA1C 2018 6   . Edema    . Essential hypertension    . GERD (gastroesophageal reflux disease)    . Headache    . Heartburn    . Hx antineoplastic chemo 2018   . Hx of radiation therapy 2018   . Hypercholesteremia    . Hyperlipidemia    . Migraine 10/18/2017    none recently   . Obesity    . Palpitations    . Pancreatic cancer (CMS [place]) 03/2017   . Panic attack    . PE (pulmonary thromboembolism) (CMS HCC) 03/29/2018   . Peripheral neuropathy 10/18/2017    knees down, bilateral   . Sleep apnea    . Type 2 diabetes mellitus (CMS HCC) 10/18/2017    Dx 2000 FBS 200s   . Unintentional weight loss 10/18/2017    25 lbs 03/2017   . Wears glasses       Past Surgical History:   Procedure Laterality Date   . CESAREAN SECTION     . HX CESAREAN SECTION     . HX CHOLECYSTECTOMY     . HX HERNIA REPAIR     . HX SUBCLAVIAN PORT IMPLANTION      s/p removed   . HX SUBCLAVIAN PORT IMPLANTION     . HX TONSIL AND ADENOIDECTOMY     . HX TONSILLECTOMY     .  PANCREATICODUODENECTOMY  2018   . UMBILICAL HERNIA REPAIR      x2      Social History     Tobacco Use   . Smoking status: Former Smoker     Packs/day: 0.50     Years: 20.00     Pack years: 10.00     Last attempt to quit: 10/18/1993     Years since quitting: 25.5   . Smokeless tobacco: Never Used   Substance Use Topics   . Alcohol use: No   . Drug use: No       Family Medical History:     Problem Relation (Age of Onset)    Diabetes Mother    Heart Disease Mother  Cannot display prior to admission medications because the patient has not been admitted in this contact.         Allergies   Allergen Reactions   . Sulfa (Sulfonamides) Itching        Review of Systems   Constitutional: Negative for chills, diaphoresis, fever, malaise/fatigue and weight loss.   HENT: Positive for congestion. Negative for ear discharge, ear pain, hearing loss, nosebleeds, sinus pain, sore throat and tinnitus.         Pt states that she has headaches since her pancreas cancer surgery.   Eyes: Negative for blurred vision, double vision, photophobia, pain, discharge and redness.   Respiratory: Negative for cough, hemoptysis, sputum production, shortness of breath, wheezing and stridor.    Cardiovascular: Negative for chest pain, palpitations, orthopnea, claudication, leg swelling and PND.   Gastrointestinal: Negative for abdominal pain, blood in stool, constipation, diarrhea, heartburn, melena, nausea and vomiting.   Genitourinary: Negative for dysuria, flank pain, frequency, hematuria and urgency.   Musculoskeletal: Negative for back pain, falls, joint pain, myalgias and neck pain.   Skin: Negative for itching and rash.   Neurological: Positive for headaches. Negative for dizziness, tingling, tremors, sensory change, speech change, focal weakness, seizures, loss of consciousness and weakness.   Endo/Heme/Allergies: Negative for environmental allergies and polydipsia. Does not bruise/bleed easily.   Psychiatric/Behavioral: Negative for  depression, substance abuse and suicidal ideas. The patient is not nervous/anxious.           There were no vitals filed for this visit.    Physical Exam   Constitutional: She is oriented to person, place, and time and well-developed, well-nourished, and in no distress. No distress.   HENT:   Head: Normocephalic and atraumatic.   Right Ear: External ear normal.   Left Ear: External ear normal.   Nose: Nose normal.   Mouth/Throat: Oropharynx is clear and moist. No oropharyngeal exudate.   Eyes: Pupils are equal, round, and reactive to light. Conjunctivae are normal. Right eye exhibits no discharge. Left eye exhibits no discharge. No scleral icterus.   Neck: Neck supple.   2+ carotid pulses without bruit   Cardiovascular: Normal rate and regular rhythm. Exam reveals no gallop and no friction rub.   No murmur heard.  Occasional ectopy - PAC vs fusion beat on monitor.   Pulmonary/Chest: No respiratory distress. She has wheezes. She has rales.   Very faint end expiratory wheeze bilateral mid lungs and occasional R base crackle.   Abdominal: Soft. She exhibits no distension. There is no abdominal tenderness. There is no guarding.   Musculoskeletal: Normal range of motion.         General: No tenderness, deformity or edema.   Neurological: She is alert and oriented to person, place, and time. No cranial nerve deficit.   Skin: Skin is warm and dry. No rash noted. She is not diaphoretic. No erythema.   Psychiatric: Memory and affect normal.        Laboratory Data:     Results for orders placed or performed during the hospital encounter of 04/13/19 (from the past 24 hour(s))   GENTAMICIN, TROUGH, SERUM   Result Value Ref Range    GENTAMICIN TROUGH 3.1 (HH) 0.6 - 2.0 ug/mL   BASIC METABOLIC PANEL, FASTING   Result Value Ref Range    SODIUM 133 (L) 137 - 145 mmol/L    POTASSIUM 4.4 3.5 - 5.1 mmol/L    CHLORIDE 104 98 - 107 mmol/L    CO2 TOTAL  24 22 - 30 mmol/L    ANION GAP 5 (L) 8 - 16 mmol/L    CALCIUM 8.1 (L) 8.4 - 10.2 mg/dL     GLUCOSE 204 (H) 74 - 106 mg/dL    BUN 18 (H) 7 - 17 mg/dL    CREATININE 0.80 0.52 - 1.04 mg/dL    BUN/CREA RATIO 23     ESTIMATED GFR >60 >60 mL/min/1.57m^2   GENTAMICIN, TROUGH, SERUM   Result Value Ref Range    GENTAMICIN TROUGH 1.7 0.6 - 2.0 ug/mL    DOSE DATE 04/15/2019     DOSE TIME  3:55 AM    CBC WITH DIFF   Result Value Ref Range    WBC 3.8 3.7 - 11.0 x10^3/uL    RBC 3.17 (L) 3.85 - 5.22 x10^6/uL    HGB 9.1 (L) 11.5 - 16.0 g/dL    HCT 28.9 (L) 34.8 - 46.0 %    MCV 91.2 78.0 - 100.0 fL    MCH 28.7 26.0 - 32.0 pg    MCHC 31.5 31.0 - 35.5 g/dL    RDW-CV 13.4 11.5 - 15.5 %    PLATELETS 85 (L) 150 - 400 x10^3/uL    MPV 12.3 8.7 - 12.5 fL    NEUTROPHIL % 63 %    LYMPHOCYTE % 18 %    MONOCYTE % 12 %    EOSINOPHIL % 5 %    BASOPHIL % 1 %    NEUTROPHIL # 2.43 1.50 - 7.70 x10^3/uL    LYMPHOCYTE # 0.70 (L) 1.00 - 4.80 x10^3/uL    MONOCYTE # 0.47 0.20 - 1.10 x10^3/uL    EOSINOPHIL # 0.18 <=0.50 x10^3/uL    BASOPHIL # <0.10 <=0.20 x10^3/uL    IMMATURE GRANULOCYTE % 1 0 - 1 %    IMMATURE GRANULOCYTE # <0.10 <0.10 x10^3/uL       Imaging Studies:    No orders to display       Assessment/Plan:  Active Hospital Problems    Diagnosis   . Sepsis (CMS [place])   . Lung nodules   . COPD (chronic obstructive pulmonary disease) (CMS HCC)   . Essential hypertension   . DNR (do not resuscitate)   . Hyperlipidemia   . 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 Urinary tract infection   . Headache   . Pancreatic cancer (CMS [place])   . Type 2 diabetes mellitus (CMS [place])       Admit to Swing Bed for completion of IV Gentamycin.  PT eval and tx as pt has been very ill and has some weakness.   Continue current medication and care.   Heplock IV.  Discussed with pt code status and pt is a DNR/DNI.       Hyacinth Meeker, MD

## 2019-04-15 NOTE — Care Plan (Signed)
Problem: Discharge Needs Assessment  Goal: Discharge Needs Assessment  Outcome: Ongoing (see interventions/notes)     Problem: Fall Injury Risk  Goal: Absence of Fall and Fall-Related Injury  Outcome: Ongoing (see interventions/notes)     Problem: Infection  Goal: Infection Symptom Resolution  Outcome: Ongoing (see interventions/notes)   Admission

## 2019-04-15 NOTE — Ancillary Notes (Signed)
[facility]        Resident/Patient Name: [name]    Capacity Review:      [x]  Initial Evaluation  []  Significant Change  []  Annual  []  Other    The term "incapacity" shall mean the inability, because of physical or mental impairment to appreciate the nature and implications of a health care decision, to make an informed choice regarding the alternatives presented, and to communicate in an unambiguous manner.     I have interviewed the above named resident/patient and have determined that he/she:    [x]   Demonstrates CAPACITY to make medical decisions.    []   Demonstrates INCAPACITY to make medical decisions.        DURATION Short Term: []     Long Term []      NATURE []  Short-term memory loss    []  Aphasia    []  Other (specify)      []  Disorientation     []  Comatose     []  Inability to process information     []  Delusions, Hallucinations       CAUSES                                         By signing below, if applicable, I am certifying this resident, who is conscious, has been informed by me, the attending physician, that he or she has been determined to be incapacitated and that a medical power of attorney representative or surrogate decision-maker may be making decisions regarding life-prolonging intervention or mental health treatment for him/her.           Physician Signature:                         Date: 04/15/19          ** For persons with Psychiatric mental illness, mental retardation or addiction who have been determined by their attending physician or a qualified physician to be incapacitated, a second opinion by a qualified physician or qualified psychologist is required to confirm incapacity before the attending physician is authorized to select a surrogate. The requirement for a second opinion shall not apply in those instances in which the medical treatment to be rendered is not for the person's psychiatric mental illness.     I concur with the above decision of  incapacity                                                                                                                                                        Physician/Psychiatrist/Psychologist        Date

## 2019-04-15 NOTE — Nurses Notes (Signed)
Dr Rudy Jew present examing pt and performing swing bed admission.  Pt to go to room 1125 from ICU.  No telemetry, no IVF's.  IVF's discontinued.  Telemetry discontinued.  Sherilyn Banker, RN

## 2019-04-15 NOTE — Nurses Notes (Signed)
Resting quietly in bed. No s/s of distress

## 2019-04-15 NOTE — Care Plan (Signed)
Give medication as ordered. Monitor vital signs. Help pt. as much as possible.

## 2019-04-15 NOTE — Nurses Notes (Signed)
Pt via wheelchair to room 1125.  Stable condition, Alert and oriented x4.  No complaints voiced.  Sherilyn Banker, RN

## 2019-04-15 NOTE — Care Management Notes (Signed)
04/15/19 1430   Assessment Details   Assessment Type Admission   Date of Care Management Update 04/15/19   Date of Next DCP Update 04/16/19   Readmission   Is this a readmission? No   Care Management Plan   Discharge Planning Status initial meeting   Projected Discharge Date 04/20/19   Discharge plan discussed with: Patient   CM will evaluate for rehabilitation potential yes   Patient choice offered to patient/family yes   Form for patient choice reviewed/signed and on chart yes   Patient aware of possible cost for ambulance transport?  No   Discharge Needs Assessment   Equipment Currently Used at Home cane, straight;other (see comments)  (rollator)   Equipment Needed After Discharge none   Discharge Facility/Level of Care Needs Home with Home Health (code 6)   Transportation Available car;family or friend will provide   Referral Information   Admission Type inpatient   Address Verified verified-no changes   Arrived From critical access hospital   Insurance Verified verified-no change   ADVANCE DIRECTIVES   Does the Patient have an Advance Directive? Yes, Patient Does Have Advance Directive for Healthcare Treatment   Type of Advance Directive Completed Medical Power of Attorney   Copy of Advance Directives in Chart? 6   Name of MPOA or [place]   Phone Number of [place] or Healthcare Surrogate [phone]   Patient Requests Assistance in Having Advance Directive Notarized. N/A   LAY CAREGIVER    Appointed Lay Caregiver? I Decline   Employment/Financial   Patient has Prescription Coverage?  Yes        Name of Insurance Coverage for Medications Aetna   Financial Concerns none   Mutuality/Individual Preferences    Patient-Specific Goals (Include Timeframe) Get stronger.  Get better   Living Environment   Select an age group to open "lives with" row.  Adult   Lives With sibling(s)   Living Arrangements house   Able to Return to Prior Arrangements yes   Home Safety   Home Assessment: No  Problems Identified   Home Accessibility no concerns;bed and bath on same level;ramps present at home   Legal Issues   Do you have a court appointed guardian/conservator? No   Patient Hand-Off   Clinical/Discharge Plan of Care Information Communicated to:  Medical Social Worker

## 2019-04-15 NOTE — Nurses Notes (Signed)
Resting quietly in bed with eyes closed. No s/s of distress.

## 2019-04-15 NOTE — Progress Notes (Signed)
[facility]  Family Medicine  Progress Note    Date of Service:  04/15/2019  [name], [name], 73 y.o. female  Date of Admission:  04/13/2019  Date of Birth:  Jan 31, 1946  PCP: Ignacia Felling, MD    HPI:  Patient looking good and feeling back to almost normal.  She has Gram negative rods in blood cultures.  She will need 5 more days of IV antibiotics.  She will be moved to swing bed when that is approved and Dr. Rudy Jew will be caring for her at that time.    acetaminophen (TYLENOL) tablet, 650 mg, Oral, Q4H PRN  apixaban (ELIQUIS) tablet, 2.5 mg, Oral, 2x/day  aspirin (ECOTRIN) enteric coated tablet 81 mg, 81 mg, Oral, Daily  atorvastatin (LIPITOR) tablet, 10 mg, Oral, QPM  cetirizine (ZYRTEC) tablet, 10 mg, Oral, Daily  dextrose 50% (0.5 g/mL) injection - syringe, 12.5 g, Intravenous, Q15 Min PRN  furosemide (LASIX) tablet, 40 mg, Oral, Daily  [Held by provider] gentamicin (GARAMYCIN) 120 mg in NS 100 mL IVPB, 2 mg/kg (Adjusted), Intravenous, Q8H  hydrALAZINE (APRESOLINE) tablet, 10 mg, Oral, Q8HRS  HYDROcodone-acetaminophen (NORCO) 5-325 mg per tablet, 1 Tab, Oral, Q4H PRN  hydrocortisone-pramoxine (PROCTOFOAM HC) 1-1% rectal foam, 1 Applicator, Rectal, 2x/day PRN  insulin glargine (LANTUS) 100 units/mL injection, 10 Units, Subcutaneous, QPM  insulin lispro (HUMALOG) 100 units/mL injection, 5 Units, Subcutaneous, 2x/day AC  LIPASE-PROTEASE-AMYLASE 36,000-114,000-180,000 UNIT CAPSULE,DELAY REL, 2 Cap, Oral, 3x/day-Meals  LORazepam (ATIVAN) tablet, 0.5 mg, Oral, Q4H PRN  losartan (COZAAR) tablet, 100 mg, Oral, Daily  magnesium hydroxide (MILK OF MAGNESIA) 400mg  per 66mL oral liquid, 15 mL, Oral, Daily PRN  metoprolol succinate (TOPROL-XL) 24 hr extended release tablet, 25 mg, Oral, Daily  montelukast (SINGULAIR) 10 mg tablet, 10 mg, Oral, Daily  NS bolus infusion 50 mL, 50 mL, Intravenous, Give in Radiology  NS flush syringe, 10 mL, Intracatheter, Q8HRS  NS flush syringe, 10 mL, Intracatheter,  Q1H PRN  NS premix infusion, , Intravenous, Continuous  ondansetron (ZOFRAN ODT) rapid dissolve tablet, 4 mg, Oral, Q6H PRN  ondansetron (ZOFRAN) 2 mg/mL injection, 4 mg, Intravenous, Q6H PRN  pantoprazole (PROTONIX) delayed release tablet, 40 mg, Oral, Daily  sodium chloride tablet, 1 g, Oral, 3x/day-Meals  SSIP insulin lispro (HUMALOG) 100 units/mL injection, 0-12 Units, Subcutaneous, 4x/day PRN         Allergies   Allergen Reactions   . Sulfa (Sulfonamides) Itching             Filed Vitals:    04/14/19 2150 04/15/19 0012 04/15/19 0400 04/15/19 0637   BP: (!) 132/50 (!) 150/77 (!) 147/87 (!) 146/54   Pulse:  76 83    Resp:  18 17    Temp:  37.1 C (98.7 F) 36.6 C (97.9 F)    SpO2:  99% 93%        Review of Systems   Constitutional: Negative for chills and fever.   Respiratory: Positive for wheezing. Negative for cough and shortness of breath.    Cardiovascular: Negative for chest pain and palpitations.   Gastrointestinal: Negative for abdominal pain, heartburn, nausea and vomiting.   Genitourinary: Negative for dysuria.       Physical Exam   Constitutional: She is oriented to person, place, and time and well-developed, well-nourished, and in no distress. No distress.   Cardiovascular: Normal rate and normal heart sounds.   Occasional PAC's   Pulmonary/Chest: Effort normal and breath sounds normal. No respiratory distress. She has no wheezes.  She has no rales. She exhibits no tenderness.   Abdominal: Soft. Bowel sounds are normal. She exhibits no distension. There is no abdominal tenderness. There is no rebound and no guarding.   Musculoskeletal:         General: Edema present.      Comments: Trace edema in ankles   Neurological: She is alert and oriented to person, place, and time.   Skin: Skin is warm and dry. She is not diaphoretic.   Psychiatric: Affect and judgment normal.       Laboratory Data:     Results for orders placed or performed during the hospital encounter of 04/13/19 (from the past 24 hour(s))      GENTAMICIN, TROUGH, SERUM   Result Value Ref Range    GENTAMICIN TROUGH 4.6 (HH) 0.6 - 2.0 ug/mL   GENTAMICIN, TROUGH, SERUM   Result Value Ref Range    GENTAMICIN TROUGH 3.1 (HH) 0.6 - 2.0 ug/mL   BASIC METABOLIC PANEL, FASTING   Result Value Ref Range    SODIUM 133 (L) 137 - 145 mmol/L    POTASSIUM 4.4 3.5 - 5.1 mmol/L    CHLORIDE 104 98 - 107 mmol/L    CO2 TOTAL 24 22 - 30 mmol/L    ANION GAP 5 (L) 8 - 16 mmol/L    CALCIUM 8.1 (L) 8.4 - 10.2 mg/dL    GLUCOSE 204 (H) 74 - 106 mg/dL    BUN 18 (H) 7 - 17 mg/dL    CREATININE 0.80 0.52 - 1.04 mg/dL    BUN/CREA RATIO 23     ESTIMATED GFR >60 >60 mL/min/1.78m^2   GENTAMICIN, TROUGH, SERUM   Result Value Ref Range    GENTAMICIN TROUGH 1.7 0.6 - 2.0 ug/mL    DOSE DATE 04/15/2019     DOSE TIME  3:55 AM    CBC WITH DIFF   Result Value Ref Range    WBC 3.8 3.7 - 11.0 x10^3/uL    RBC 3.17 (L) 3.85 - 5.22 x10^6/uL    HGB 9.1 (L) 11.5 - 16.0 g/dL    HCT 28.9 (L) 34.8 - 46.0 %    MCV 91.2 78.0 - 100.0 fL    MCH 28.7 26.0 - 32.0 pg    MCHC 31.5 31.0 - 35.5 g/dL    RDW-CV 13.4 11.5 - 15.5 %    PLATELETS 85 (L) 150 - 400 x10^3/uL    MPV 12.3 8.7 - 12.5 fL    NEUTROPHIL % 63 %    LYMPHOCYTE % 18 %    MONOCYTE % 12 %    EOSINOPHIL % 5 %    BASOPHIL % 1 %    NEUTROPHIL # 2.43 1.50 - 7.70 x10^3/uL    LYMPHOCYTE # 0.70 (L) 1.00 - 4.80 x10^3/uL    MONOCYTE # 0.47 0.20 - 1.10 x10^3/uL    EOSINOPHIL # 0.18 <=0.50 x10^3/uL    BASOPHIL # <0.10 <=0.20 x10^3/uL    IMMATURE GRANULOCYTE % 1 0 - 1 %    IMMATURE GRANULOCYTE # <0.10 <0.10 x10^3/uL       Imaging Studies:    CT ANGIO CHEST FOR PULMONARY EMBOLUS W IV CONTRAST   Final Result   No evidence for pulmonary embolism. No acute infiltrate or   effusion   Tiny lung nodules right middle lobe and lingular area, I would suggest   follow-up to exclude metastatic disease considering patient's history of   carcinoma.   Abnormal esophagus with fluid filling distention and wall thickening.  Small pericardial effusion         The CT exam was performed  using one or more the following a dose reduction   techniques: Automated exposure control, adjustment of the mA and/or kV   according to the patient's size, or use of iterative reconstruction   technique.            Radiologist location ID: [id number]         CT BRAIN WO IV CONTRAST   Final Result   Chronic appearing change without evidence for acute finding as   above         The CT exam was performed using one or more the following a dose reduction   techniques: Automated exposure control, adjustment of the mA and/or kV   according to the patient's size, or use of iterative reconstruction   technique.            Radiologist location ID: [id number]             Assessment/Plan:  Hospital Problems    1Sepsis (CMS [facility])         Date Noted: 04/15/2019      Urinary tract infection         Date Noted: 04/13/2019      COPD (chronic obstructive pulmonary disease) (CMS [place])         Date Noted: 04/13/2019      Essential hypertension         Date Noted: 04/13/2019      CHF (congestive heart failure) (CMS HCC)         Date Noted: 04/13/2019      DNR (do not resuscitate)         Date Noted: 04/13/2019      Hyperlipidemia         Date Noted: 04/13/2019      SIADH (syndrome of inappropriate ADH production) (CMS [facility])         Date Noted: 04/13/2019      Resolved Hospital Problems  No resolved problems to display.      Stop IV Fluid.  Move to swing bed when approved to the care of Dr. Alden Benjamin.  Note patient needs Repeat CT of the chest in 3 months to follow up lung nodule.    Myrene Buddy, MD

## 2019-04-15 NOTE — Discharge Summary (Addendum)
[facility]  DISCHARGE SUMMARY      PATIENT NAME:  [name], [name]  MRN:  [id number]  DOB:  1945/12/30    INPATIENT ADMISSION DATE: 04/13/2019  DISCHARGE DATE:  04/15/2019    ATTENDING PHYSICIAN: Myrene Buddy, MD  SERVICE: [facility]  PRIMARY CARE PHYSICIAN: Ignacia Felling, MD       DISCHARGE DIAGNOSIS:    Principle Problem: Sepsis (CMS [facility])  Active Hospital Problems    Diagnosis Date Noted   . Principle Problem: Sepsis (CMS [place]) [A41.9] 04/15/2019   . Lung nodules [R91.8] 04/15/2019   . Urinary tract infection [N39.0] 04/13/2019   . COPD (chronic obstructive pulmonary disease) (CMS HCC) [J44.9] 04/13/2019   . Essential hypertension [I10] 04/13/2019   . CHF (congestive heart failure) (CMS HCC) [I50.9] 04/13/2019   . DNR (do not resuscitate) [Z66] 04/13/2019   . Hyperlipidemia [E78.5] 04/13/2019   . 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             REASON FOR HOSPITALIZATION AND HOSPITAL COURSE:  This is a 73 y.o., female regular patient of Dr. Ignacia Felling who has a hx of Pancreatic CA presented to the ER with evidence of infection suggesting that she was septic.  Source was found to be UTI.  She is growing 4 bottle of + blood cultures with gram negative rods.  Sensitivity and ID is not back yet.  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  She has markedly improved.  She will need 5 more days of IV antibiotics.  She was noted in the ER on CT of the chest to have several tiny nodules in the right chest and needs to have repeat CT in August 2020 to to follow these up.  The patient and Dr. Rudy Jew was told this.  Her COVID 19 swab was negative.     PHYSICAL EXAM:   Filed Vitals:    04/15/19 0400 04/15/19 0637 04/15/19 0800 04/15/19 0909   BP: (!) 147/87 (!) 146/54 (!) 175/66 (!) 146/52   Pulse: 83  92 90   Resp: 17  20    Temp: 36.6 C (97.9 F)  36.9 C (98.4 F)    SpO2: 93%   95%       Weight: 65.4 kg (144 lb 1.6 oz)   Physical Exam   Constitutional: She is oriented to person, place, and time and well-developed, well-nourished, and in no distress. No distress.   HENT:   Head: Normocephalic and atraumatic.   Eyes: Pupils are equal, round, and reactive to light. EOM are normal. Right eye exhibits no discharge. Left eye exhibits no discharge. No scleral icterus.   Cardiovascular: Normal rate, regular rhythm and normal heart sounds.   No murmur heard.  Pulmonary/Chest: Effort normal and breath sounds normal. No respiratory distress. She has no wheezes. She has no rales. She exhibits no tenderness.   Abdominal: Soft. Bowel sounds are normal. She exhibits no distension. There is no abdominal tenderness. There is no rebound and no guarding.   Musculoskeletal: Normal range of motion.         General: Edema present.      Comments: Mild edema in ankles   Neurological: She is alert and oriented to person, place, and time.   Skin: Skin is warm and dry. She is not diaphoretic. No erythema.   Psychiatric:  Affect normal.       Results for orders placed or performed during the hospital encounter of 04/13/19 (from the past 24 hour(s))   GENTAMICIN, TROUGH, SERUM   Result Value Ref Range    GENTAMICIN TROUGH 4.6 (HH) 0.6 - 2.0 ug/mL   GENTAMICIN, TROUGH, SERUM   Result Value Ref Range    GENTAMICIN TROUGH 3.1 (HH) 0.6 - 2.0 ug/mL   BASIC METABOLIC PANEL, FASTING   Result Value Ref Range    SODIUM 133 (L) 137 - 145 mmol/L    POTASSIUM 4.4 3.5 - 5.1 mmol/L    CHLORIDE 104 98 - 107 mmol/L    CO2 TOTAL 24 22 - 30 mmol/L    ANION GAP 5 (L) 8 - 16 mmol/L    CALCIUM 8.1 (L) 8.4 - 10.2 mg/dL    GLUCOSE 204 (H) 74 - 106 mg/dL    BUN 18 (H) 7 - 17 mg/dL    CREATININE 0.80 0.52 - 1.04 mg/dL    BUN/CREA RATIO 23     ESTIMATED GFR >60 >60 mL/min/1.81m2    Narrative    Estimated Glomerular Filtration Rate (eGFR) calculated using the CKD-EPI (2009) equation, intended for patients 71years of age and older. If race  and/or gender is not documented or "unknown," there will be no eGFR calculation.   CBC/DIFF    Narrative    The following orders were created for panel order CBC/DIFF.  Procedure                               Abnormality         Status                     ---------                               -----------         ------                     CBC WITH DIFF[[id number]]                Abnormal            Final result                 Please view results for these tests on the individual orders.   GENTAMICIN, TROUGH, SERUM   Result Value Ref Range    GENTAMICIN TROUGH 1.7 0.6 - 2.0 ug/mL    DOSE DATE 04/15/2019     DOSE TIME  3:55 AM    CBC WITH DIFF   Result Value Ref Range    WBC 3.8 3.7 - 11.0 x10^3/uL    RBC 3.17 (L) 3.85 - 5.22 x10^6/uL    HGB 9.1 (L) 11.5 - 16.0 g/dL    HCT 28.9 (L) 34.8 - 46.0 %    MCV 91.2 78.0 - 100.0 fL    MCH 28.7 26.0 - 32.0 pg    MCHC 31.5 31.0 - 35.5 g/dL    RDW-CV 13.4 11.5 - 15.5 %    PLATELETS 85 (L) 150 - 400 x10^3/uL    MPV 12.3 8.7 - 12.5 fL    NEUTROPHIL % 63 %    LYMPHOCYTE % 18 %    MONOCYTE % 12 %    EOSINOPHIL % 5 %  BASOPHIL % 1 %    NEUTROPHIL # 2.43 1.50 - 7.70 x10^3/uL    LYMPHOCYTE # 0.70 (L) 1.00 - 4.80 x10^3/uL    MONOCYTE # 0.47 0.20 - 1.10 x10^3/uL    EOSINOPHIL # 0.18 <=0.50 x10^3/uL    BASOPHIL # <0.10 <=0.20 x10^3/uL    IMMATURE GRANULOCYTE % 1 0 - 1 %    IMMATURE GRANULOCYTE # <0.10 <0.10 x10^3/uL        DISCHARGE MEDICATIONS:     Current Discharge Medication List      START taking these medications.      Details   acetaminophen 325 mg Tablet  Commonly known as:  TYLENOL   650 mg, Oral, EVERY 4 HOURS PRN  Refills:  0     dextrose 50% in water 50% Syringe   25 mL, Intravenous, EVERY 15 MIN PRN  Refills:  0     * gentamicin 120 mg in NS 103 mL infusion  Notes to patient:  THIS DRUG has been changed to 240 mg DAILY at 0900.   2 mg/kg (120 mg), Intravenous, EVERY 8 HOURS, Mix and infuse per policy of [facility].  Refills:  0     * gentamicin 240 mg in NS 106 mL  infusion  Start taking on:  Apr 16, 2019   5 mg/kg (240 mg), Intravenous, EVERY 24 HOURS, Mix and infuse per policy of [facility].  Refills:  0     insulin lispro 100 units/mL Injectable  Commonly known as:  HUMALOG   0-12 Units, Subcutaneous, 4 TIMES DAILY PRN  Refills:  0     magnesium hydroxide 400 mg/5 mL Suspension  Commonly known as:  MILK OF MAGNESIA   1,200 mg, Oral, DAILY PRN  Refills:  0     ondansetron 2 mg/mL Solution  Commonly known as:  ZOFRAN   4 mg, Intravenous, EVERY 6 HOURS PRN  Refills:  0         * This list has 2 medication(s) that are the same as other medications prescribed for you. Read the directions carefully, and ask your doctor or other care provider to review them with you.            CONTINUE these medications which have CHANGED during your visit.      Details   hydrALAZINE 10 mg Tablet  Commonly known as:  APRESOLINE  What changed:  when to take this   10 mg, Oral, EVERY 8 HOURS (SCHEDULED)  Qty:  90 Tab  Refills:  0     hydrocortisone-pramoxine 1-1 % Foam  Commonly known as:  PROCTOFOAM HC  What changed:     when to take this   reasons to take this   1 Applicator, Rectal, EVERY 6 HOURS  Qty:  10 g  Refills:  0        CONTINUE these medications - NO CHANGES were made during your visit.      Details   apixaban 2.5 mg Tablet  Commonly known as:  ELIQUIS   2.5 mg, Oral, 2 TIMES DAILY  Qty:  60 Tab  Refills:  1     aspirin 81 mg Tablet, Delayed Release (E.C.)  Commonly known as:  ECOTRIN   81 mg, Oral  Refills:  0     atorvastatin 10 mg Tablet  Commonly known as:  LIPITOR   10 mg, Oral  Refills:  0     cetirizine 10 mg Tablet  Commonly known as:  ZYRTEC   10 mg, Oral  Refills:  0     furosemide 20 mg Tablet  Commonly known as:  LASIX   40 mg, Oral, DAILY  Qty:  60 Tab  Refills:  0     HYDROcodone-acetaminophen 5-325 mg Tablet  Commonly known as:  NORCO   1 Tab, Oral, EVERY 4 HOURS PRN  Refills:  0     insulin aspart U-100 100 unit/mL (3 mL) Insulin Pen  Commonly known as:   NovoLOG Flexpen U-100 Insulin   5 Units, Subcutaneous, 2 TIMES DAILY BEFORE MEALS  Qty:  1 Each  Refills:  11     insulin glargine 100 unit/mL Insulin Pen  Commonly known as:  Lantus Solostar U-100 Insulin   8 Units, Subcutaneous, NIGHTLY  Qty:  1 Each  Refills:  11     LORazepam 0.5 mg Tablet  Commonly known as:  ATIVAN   0.5 mg, DAILY  Refills:  0     losartan 100 mg Tablet  Commonly known as:  COZAAR   100 mg, Oral, DAILY  Refills:  0     metoprolol succinate 50 mg Tablet Sustained Release 24 hr  Commonly known as:  TOPROL-XL   25 mg, Oral, DAILY  Refills:  0     montelukast 10 mg Tablet  Commonly known as:  SINGULAIR   10 mg, Oral  Refills:  0     omeprazole 40 mg Capsule, Delayed Release(E.C.)  Commonly known as:  PRILOSEC   40 mg, Oral, DAILY  Qty:  90 Cap  Refills:  4     ondansetron 4 mg Tablet  Commonly known as:  ZOFRAN   4 mg, Oral, EVERY 6 HOURS PRN  Refills:  0     pancreatic enzyme replacement 12,000-38,000 -60,000 unit Capsule, Delayed Release(E.C.)  Commonly known as:  CREON   Oral, 3 TIMES DAILY WITH MEALS, 2 CAPS WITH MEALS AND 1 CAP WITH EACH SNACK   Refills:  0     sodium chloride 1 gram Tablet   1 g, Oral, 3 TIMES DAILY WITH MEALS  Qty:  90 Tab  Refills:  2            RADIOLOGY REPORTS DURING ADMISSION:     Results for orders placed or performed during the hospital encounter of 04/13/19   CT BRAIN WO IV CONTRAST     Status: None    Narrative    Mayukha Karwowski    PROCEDURE DESCRIPTION: CT BRAIN WO IV CONTRAST    PROCEDURE PERFORMED DATE AND TIME: 04/13/2019 10:19 AM    CT DOSE INFORMATION:  DLP (mGycm):  1238.50 mGycm    CLINICAL INDICATION: Headache, altered mental status    COMPARISON: 07/31/2018      FINDINGS: Prominent vascular calcification especially right vertebral.  Motion artifact is noted. Atrophy. White matter small vessel ischemic  change and mild atrophy are seen.        Impression    Chronic appearing change without evidence for acute finding as  above      The CT exam was performed  using one or more the following a dose reduction  techniques: Automated exposure control, adjustment of the mA and/or kV  according to the patient's size, or use of iterative reconstruction  technique.        Radiologist location ID: [id number]     CT ANGIO CHEST FOR PULMONARY EMBOLUS W IV CONTRAST     Status: None    Narrative  Hava Jeter    PROCEDURE DESCRIPTION: CT ANGIO CHEST FOR PULMONARY EMBOLUS W IV CONTRAST    PROCEDURE PERFORMED DATE AND TIME: 04/13/2019 10:22 AM     CT DOSE INFORMATION:  DLP (mGycm):  315.30 mGycm    INTRAVENOUS CONTRAST:100 of  Isovue 370     CLINICAL INDICATION: Elevated D-dimer, fever    COMPARISON: 07/31/2018      FINDINGS: There is no evidence for pulmonary embolism. Fluid-filled dilated  esophagus with a somewhat thickened wall. Small anteriorly located  pericardial effusion. There is a small focal soft tissue type density upper  mediastinum on the left with low-attenuation numbers which may represent  scarring though I cannot exclude other etiology. There is lower lung zones  of atelectasis. There is a small focal density in the right middle lobe  periphery with soft tissue density readings. Third area is present in the  region of the lingula. Focal scarring left lower lung periphery towards the  gutter. Similar findings on the right.  Pacer related artifacts      Impression    No evidence for pulmonary embolism. No acute infiltrate or  effusion  Tiny lung nodules right middle lobe and lingular area, I would suggest  follow-up to exclude metastatic disease considering patient's history of  carcinoma.  Abnormal esophagus with fluid filling distention and wall thickening.  Small pericardial effusion      The CT exam was performed using one or more the following a dose reduction  techniques: Automated exposure control, adjustment of the mA and/or kV  according to the patient's size, or use of iterative reconstruction  technique.        Radiologist location ID: [id number]               MICROBIOLOGY CULTURES DURING ADMISSION:   Hospital Encounter on 04/13/19 (from the past 96 hour(s))   ADULT ROUTINE BLOOD CULTURE, SET OF 2 BOTTLES (BACTERIA AND YEAST)    Collection Time: 04/13/19  8:29 AM   Culture Result Status    BLOOD CULTURE, ROUTINE Moderate Growth GRAM NEGATIVE ORGANISM Preliminary    BLOOD CULTURE, ROUTINE Moderate Growth GRAM NEGATIVE ORGANISM Preliminary    GRAM STAIN 2+ Few Gram Negative Rod (A) Preliminary    GRAM STAIN 3+ Several Gram Negative Rod (A) Preliminary    Narrative    Isolate 1 is Aerobic bottle  Isolate 2 is Anaerobic bottle   ADULT ROUTINE BLOOD CULTURE, SET OF 2 BOTTLES (BACTERIA AND YEAST)    Collection Time: 04/13/19  8:31 AM   Culture Result Status    BLOOD CULTURE, ROUTINE Moderate Growth GRAM NEGATIVE ORGANISM Preliminary    BLOOD CULTURE, ROUTINE Moderate Growth GRAM NEGATIVE ORGANISM Preliminary    GRAM STAIN 3+ Several Gram Negative Rod (A) Preliminary    GRAM STAIN 2+ Few Gram Negative Rod (A) Preliminary    Narrative    Isolate 1 is Aerobic bottle  Isolate 2 is Anaerobic bottle   RAPID INFLUENZA A/B  - NASOPHARYNGEAL SWAB    Collection Time: 04/13/19  8:37 AM   Culture Result Status    INFLUENZA A ANTIGEN Negative Final    INFLUENZA B ANTIGEN Negative Final   URINE CULTURE,ROUTINE    Collection Time: 04/13/19  8:57 AM   Culture Result Status    URINE CULTURE >100000 GRAM NEGATIVE ORGANISM Preliminary       DISCHARGE INSTRUCTIONS:  Advance Directive Information      Most Recent Value   Does the Patient have an Advance Directive?  Yes, Patient Does Have Advance Directive for Healthcare Treatment   Type of Advance Directive Completed  Medical Power of Attorney   Copy of Advance Directives in Chart?  No, Copy Requested From Patient   Name of [place] sister                    Patient to go to swing bed today to the care of Dr. Alden Benjamin    DISCHARGE DISPOSITION:   DISCHARGE PATIENT   Ordered at: 04/15/19 0932     Is there a  planned readmission to acute care within 30 days?    Yes     Disposition:    SKILLED NURSING      Code Status:   Code Status Information     Code Status    No CPR             PATIENT CARE TEAM:  Copies sent to Care Team       Relationship Specialty Notifications Start End    Ignacia Felling, MD PCP - General FAMILY MEDICINE  10/22/18     Phone: [phone] Fax: [phone]         [address] [place] [address]        Discharge to the Swingbed today to the care of Dr. Rudy Jew.  Patient needs 5 more days of IV antibiotics for her Sepsis and UTI.  She also needs repeat CT of the chest in August 2020.    Myrene Buddy, MD

## 2019-04-16 ENCOUNTER — Encounter (HOSPITAL_COMMUNITY): Payer: Self-pay

## 2019-04-16 LAB — ECG 12-LEAD
Atrial Rate: 91 {beats}/min
Calculated P Axis: 37 degrees
Calculated R Axis: -21 degrees
Calculated T Axis: 29 degrees
PR Interval: 166 ms
QRS Duration: 86 ms
QT Interval: 360 ms
QTC Calculation: 442 ms
Ventricular rate: 91 {beats}/min

## 2019-04-16 LAB — GENTAMICIN, TROUGH, SERUM: GENTAMICIN TROUGH: 2.7 ug/mL (ref 0.6–2.0)

## 2019-04-16 MED ORDER — SODIUM CHLORIDE 0.9 % INTRAVENOUS SOLUTION
5.00 mg/kg | INTRAVENOUS | Status: DC
Start: 2019-04-17 — End: 2019-04-18
  Administered 2019-04-17: 09:00:00 0 mg via INTRAVENOUS
  Administered 2019-04-17: 240 mg via INTRAVENOUS
  Filled 2019-04-16 (×4): qty 6

## 2019-04-16 MED ORDER — GENTAMICIN 40 MG/ML INJECTION SOLUTION
5.00 mg/kg | INTRAMUSCULAR | Status: DC
Start: 2019-04-18 — End: 2019-04-16

## 2019-04-16 MED ADMIN — sodium chloride 0.9 % (flush) injection syringe: @ 21:00:00

## 2019-04-16 MED ADMIN — losartan 50 mg tablet: ORAL | @ 09:00:00

## 2019-04-16 MED ADMIN — hydrALAZINE 10 mg tablet: ORAL | @ 14:00:00

## 2019-04-16 MED ADMIN — cetirizine 10 mg tablet: ORAL | @ 09:00:00

## 2019-04-16 MED ADMIN — sodium chloride 0.9 % (flush) injection syringe: @ 06:00:00

## 2019-04-16 MED ADMIN — sodium chloride 0.9 % (flush) injection syringe: @ 14:00:00

## 2019-04-16 MED ADMIN — furosemide 40 mg tablet: ORAL | @ 09:00:00

## 2019-04-16 MED ADMIN — atorvastatin 10 mg tablet: ORAL | @ 21:00:00

## 2019-04-16 MED ADMIN — insulin lispro 100 unit/mL subcutaneous solution: SUBCUTANEOUS | @ 07:00:00

## 2019-04-16 MED ADMIN — apixaban 5 mg tablet: ORAL | @ 09:00:00

## 2019-04-16 MED ADMIN — insulin lispro 100 unit/mL subcutaneous solution: SUBCUTANEOUS | @ 13:00:00

## 2019-04-16 MED ADMIN — hydrALAZINE 10 mg tablet: ORAL | @ 06:00:00

## 2019-04-16 MED ADMIN — sodium chloride 1 gram tablet: ORAL | @ 09:00:00

## 2019-04-16 MED ADMIN — hydrALAZINE 10 mg tablet: ORAL | @ 22:00:00

## 2019-04-16 MED ADMIN — lipase-protease-amylase 36,000-114,000-180,000 unit capsule,delay rel: ORAL | @ 12:00:00

## 2019-04-16 MED ADMIN — lipase-protease-amylase 36,000-114,000-180,000 unit capsule,delay rel: ORAL | @ 17:00:00

## 2019-04-16 MED ADMIN — insulin lispro 100 unit/mL subcutaneous solution: SUBCUTANEOUS | @ 17:00:00

## 2019-04-16 MED ADMIN — apixaban 5 mg tablet: ORAL | @ 20:00:00

## 2019-04-16 NOTE — Care Plan (Signed)
Problem: Discharge Needs Assessment  Goal: Discharge Needs Assessment  Outcome: Ongoing (see interventions/notes)  Flowsheets  Taken 04/15/2019 1430 by Gilmore Laroche  Discharge Facility/Level of Care Needs: Home with [place] (code 6)  Equipment Needed After Discharge: none  Equipment Currently Used at Home: (rollator)   cane, straight   other (see comments)  Transportation Available:   car   family or friend will provide  Taken 04/15/2019 1435 by Gilmore Laroche  Concerns To Be Addressed: care coordination/care conferences  Concerns Comments: Care conference every Tuesday     Problem: Infection  Goal: Infection Symptom Resolution  Outcome: Ongoing (see interventions/notes)  Intervention: Prevent or Manage Infection  Flowsheets (Taken 04/15/2019 2100)  Fever Reduction/Comfort Measures:   medication administered   room temperature adjusted   Noe Gens, RN  04/16/2019, 05:54

## 2019-04-16 NOTE — Care Plan (Signed)
LTC Physical Therapy Evaluation  Rehabilitation Services          Patient Name: [name]  Date of Birth: 1946-01-22  Height: Height: 157.5 cm (5\' 2" )  Weight: Weight: 66.9 kg (147 lb 7 oz)  Room/Bed: 1125/A  Payor: Payor: MEDICARE / Plan: MEDICARE PART A AND B / Product Type: Medicare /     Admission Diagnosis: UTI (urinary tract infection) [N39.0]      Assessment:      Patient only requires SBA to CGA x 1 on most of her functional mobility tasks during her PT session. She presents with slight general weakness of all 4's but able to perform all strengthening exercises without any difficulty. She has good potential to return to her previous functional level which is independent/modified independent with use of an AD.        Plan:     Functional transfer training, LE strengthening/ROM, Gait training  5x/wk  until discharge       Evaluation:     S: "I'm sleepy today."      04/16/19 0900   PT Last Visit   PT Received On 04/16/19   General   Type of Evaluation Evaluation   Chart Reviewed Yes   Home Living   Type of [place] With Family;Other (Comment)  (lives with her sister and brother in law)   Home Access Ramped entrance   Prior Function   Level of Independence Independent with ADLs and functional transfers   Bed Mobility   Did the resident have bed mobility? Yes   Did the resident need help moving in bed? Setup help only   Did you supervise the resident moving in bed? I supervised the activity   Transfer   Did the resident transfer? Yes   Did the resident need help to transfer? Setup help only   Did you supervise the resident transferring? I supervised the activity   Walk in Room   Did the resident walk in the room? Yes   Did the resident need help walking in the room? Setup help only   Did you supervise the resident walking in the room? I supervised the activity   Mobility Devices   Mobility Devices Normally Used B. Walker   Balance During Transitions and Walking   Moving From Seated to Standing  Position Steady at all times   Walking (With Assistive Device if Used) Steady at all times   Turning Around and Interior and spatial designer While Walking Steady at all times   Cognition   Overall Cognitive Status WFL   Arousal/Alertness Appropriate responses to stimuli   Memory Appears intact   Orientation Level Oriented X4   Perception   Inattention/Neglect Appears intact   Transfers   Transfer Yes   Transfer 1   Transfer From 1 Bed   Transfer Type 1 To and from   Transfer to 1 Stand   Technique 1 Stand to sit;Sit to stand   Transfer Device 1 FWW   Transfers 2   Transfer From 2 Bed   Transfer Type 2 To   Transfer to 2 Chair with arms   Technique 2 Sit to stand;Stand to sit   Transfer Device 2 FWW   Ambulation   Ambulation Yes   Ambulation 1   Surface 1 Level tile   Device 1 Rolling walker   Assistance 1 Close supervision;Contact guard   Quality of Gait 1 slow steady cadence   Comments/Distance (ft) 1 25 FT, 50  FT   RLE Assessment   RLE Assessment X   Strength RLE   R Hip Flexion 4/5   R Hip Extension 4/5   R Hip ABduction 4/5   R Hip Adduction 4/5   R Hip Eternal Rotation 4/5   R Hip Internal Rotation 4/5   R Knee Flexion 4/5   R Knee Extension 4/5   R Ankle Dorsiflexion 4/5   R Ankle Plantar Flexion 4/5   R Ankle Eversion 4/5   R Ankle Inversion 4/5   LLE Assessment   LLE Assessment X   Strength LLE   L Hip Flexion 4/5   L Hip Extension 4/5   L Hip ABduction 4/5   L Hip ADduction 4/5   L Knee Flexion 4/5   L Knee Extension 4/5   L Ankle Dorsiflexion 4/5   L Ankle Plantar Flexion 4/5   Care Plan Goals   PT Rehab Goals Strength Goal;Gait Training Goal;Transfer Training Goal   Gait Training  Goal, Distance to Achieve   Gait Training  Goal, Date Established 04/16/19   Gait Training  Goal, Time to Achieve by discharge   Gait Training  Goal, Independence Level independent;modified independence   Gait Training  Goal, Assist Device least restricted assistive device;walker, rolling   Gait Training  Goal, Distance to Achieve  210+   Strength Goal   Strength Goal, Date Established 04/16/19   Strength Goal, Time to Achieve by discharge   Strength Goal, Measure to Achieve promote increased strength of BLE's to 5/5   Strength Goal, Functional Goal resist at least 2-3# load   Transfer Training Goal   Transfer Training Goal, Date Established 04/16/19   Transfer Training Goal, Time to Achieve by discharge   Transfer Training Goal, Activity Type bed-to-chair/chair-to-bed;sit-to-stand/stand-to-sit   Transfer Training Goal, Independence Level independent;modified independence   Transfer Training Goal, Assist Device least restrictrictive assistive device;walker, rolling   Plan   Treatment/Interventions Functional transfer training;LE strengthening/ROM;Gait training   PT Frequency 5x/wk   Duration  until discharge             Certification From:  04-16-2019             To:  05-17-2019      Jessie Foot, PT, DPT 04/16/2019, 12:34

## 2019-04-16 NOTE — Nurses Notes (Signed)
24hr cc done

## 2019-04-16 NOTE — Nurses Notes (Signed)
Notified Dr Linton Flemings of [facility] of 2.7 instructed to call pharmacy to adjust dosage. Notified Reeves Forth Pharmacist of trough level states she will adjust medication.

## 2019-04-16 NOTE — Care Plan (Signed)
Problem: Fall Injury Risk  Goal: Absence of Fall and Fall-Related Injury  Outcome: Ongoing (see interventions/notes)

## 2019-04-17 LAB — GENTAMICIN, TROUGH, SERUM: GENTAMICIN TROUGH: 0.6 ug/mL (ref 0.6–2.0)

## 2019-04-17 MED ADMIN — Medication: INTRAVENOUS | @ 09:00:00

## 2019-04-17 MED ADMIN — metoprolol succinate ER 25 mg tablet,extended release 24 hr: ORAL | @ 09:00:00

## 2019-04-17 MED ADMIN — sodium chloride 1 gram tablet: ORAL | @ 17:00:00

## 2019-04-17 MED ADMIN — apixaban 5 mg tablet: ORAL | @ 20:00:00

## 2019-04-17 MED ADMIN — pantoprazole 40 mg tablet,delayed release: ORAL | @ 06:00:00

## 2019-04-17 MED ADMIN — lipase-protease-amylase 36,000-114,000-180,000 unit capsule,delay rel: ORAL | @ 17:00:00

## 2019-04-17 MED ADMIN — hydrALAZINE 10 mg tablet: ORAL | @ 06:00:00

## 2019-04-17 MED ADMIN — atorvastatin 10 mg tablet: ORAL | @ 20:00:00

## 2019-04-17 MED ADMIN — insulin glargine (U-100) 100 unit/mL (3 mL) subcutaneous pen: SUBCUTANEOUS | @ 21:00:00

## 2019-04-17 MED ADMIN — apixaban 5 mg tablet: ORAL | @ 09:00:00

## 2019-04-17 MED ADMIN — sodium chloride 1 gram tablet: ORAL | @ 09:00:00

## 2019-04-17 MED ADMIN — aspirin 81 mg tablet,delayed release: ORAL | @ 09:00:00

## 2019-04-17 MED ADMIN — sodium chloride 0.9 % (flush) injection syringe: @ 22:00:00

## 2019-04-17 MED ADMIN — lipase-protease-amylase 36,000-114,000-180,000 unit capsule,delay rel: ORAL | @ 12:00:00

## 2019-04-17 MED ADMIN — insulin lispro 100 unit/mL subcutaneous solution: SUBCUTANEOUS | @ 17:00:00

## 2019-04-17 MED ADMIN — insulin lispro 100 unit/mL subcutaneous solution: SUBCUTANEOUS | @ 12:00:00

## 2019-04-17 MED ADMIN — insulin lispro 100 unit/mL subcutaneous solution: SUBCUTANEOUS | @ 20:00:00

## 2019-04-17 MED ADMIN — sodium chloride 1 gram tablet: ORAL | @ 12:00:00

## 2019-04-17 MED ADMIN — hydrALAZINE 10 mg tablet: ORAL | @ 21:00:00

## 2019-04-17 MED ADMIN — sodium chloride 0.9 % (flush) injection syringe: @ 06:00:00

## 2019-04-17 NOTE — Care Plan (Signed)
[facility]  Swing Bed IDT  2 Day        Patient Name: [name]  MRN: [id number]  Gender: female  Age: 73 y.o.  Room/Bed:  1125/A  Date of Birth: August 03, 1946    Conference Number: 1st Conference    Nursing  Patients Nursing Progress In Swing bed can be described as?: Progressing    Does the patient have any Nursing changes from the previous week?: No    No data recorded    Respiratory Therapy  No data recorded  No data recorded  No data recorded    Dietary  Is the patient receiving dietary services?: Yes    How is the patient progressing with dietary needs and concerns while in swingbed?: Progressing with current treatment plan    Does the patient require any dietary changes or have any issues related to heir dietary status?: no      Physical Therapy  Is the patient Receiving Physical Therapy?: Yes    No data recorded  Physical Therapy Progress Comment: good potential to return to her previoius fxnal level    What is the Patients Plan, and are there any issues-concerns that have affected Physical Therapy?: return home to her sister's house      Occupational Therapy  No data recorded  No data recorded  No data recorded  No data recorded    Speech Therapy  Is patient receiving speech therapy?: No    No data recorded  No data recorded  No data recorded    Social Work  Does the patient or family have any psycho-social concerns that needs assistance from social services? : No    Are there any needs for a patient or family conference with social services?: Yes    If Yes, Family Conference Planned for when?: weekly care plan meeting     Does the Patient have any discharge plans for social services that are different from last team meeting?: No    No data recorded  No data recorded    Case Management or Transition of Care  Does the patient have any case management or transition of care issues?: Yes    No data recorded  Equipment, Referral, Home Services Needs Addressd: will need home health at  discharge       Activities  How often does the patient particiapte in activities?: Daily    What level of participation does the patient exhibit with activities?: Participating    What is the patients activities plan for the week with activities?: will watch tv at times .             Team Discussion: Patient conference yesterday held. Was only day 1. Will reevaluate next Tuesday on May 12 .     Team Members Present: Raelene Bott RN, Benjamine Mola LPN   Location: patient room       Raelene Bott, CASE MANAGER

## 2019-04-17 NOTE — Nurses Notes (Signed)
24 hour chart check completed

## 2019-04-17 NOTE — Care Plan (Signed)
[facility]  [facility] Services  Occupational Therapy  Initial Evaluation    Patient name:  [name]  Date of birth   01-05-1946  MRN:   [id number]  Payor:   Payor: MEDICARE / Plan: MEDICARE PART A AND B / Product Type: Medicare /   Referring provider:  Myrene Buddy, MD   OT eval: 04/17/2019  Precautions: VRE contact precautions, falls risk    Medical History related to diagnosis:  Past Medical History:   Diagnosis Date   . Abdominal hernia 10/18/2017    hx of repair   . Anxiety    . Arthritis    . Asthma    . Atrial fibrillation (CMS HCC)    . Back problem    . Bruises easily    . Cancer (CMS [place]) 10/18/2017    chemo and radiation completed 09/24/2017   . COPD (chronic obstructive pulmonary disease) (CMS HCC)    . CPAP (continuous positive airway pressure) dependence 10/18/2017    has not used recently   . Depression    . DM (diabetes mellitus) (CMS HCC) 2000    Fasting BG 200's. HGA1C 2018 6   . Edema    . Essential hypertension    . GERD (gastroesophageal reflux disease)    . Headache    . Heartburn    . Hx antineoplastic chemo 2018   . Hx of radiation therapy 2018   . Hypercholesteremia    . Hyperlipidemia    . Migraine 10/18/2017    none recently   . Obesity    . Palpitations    . Pancreatic cancer (CMS [place]) 03/2017   . Panic attack    . PE (pulmonary thromboembolism) (CMS HCC) 03/29/2018   . Peripheral neuropathy 10/18/2017    knees down, bilateral   . Sleep apnea    . Type 2 diabetes mellitus (CMS HCC) 10/18/2017    Dx 2000 FBS 200s   . Unintentional weight loss 10/18/2017    25 lbs 03/2017   . Wears glasses            Past Surgical History:   Procedure Laterality Date   . CESAREAN SECTION     . HX CESAREAN SECTION     . HX CHOLECYSTECTOMY     . HX HERNIA REPAIR     . HX SUBCLAVIAN PORT IMPLANTION      s/p removed   . HX SUBCLAVIAN PORT IMPLANTION     . HX TONSIL AND ADENOIDECTOMY     . HX TONSILLECTOMY     . PANCREATICODUODENECTOMY  2018   . UMBILICAL HERNIA REPAIR        x2         SUBJECTIVE   "I am so sleepy. I guess it's where its cold and rainy"    OBJECTIVE     Pt was admitted to [facility] UTI and sepsis for IV antibiotics. She is on contact isolation.      04/17/19 1000   General   Type of Evaluation Evaluation   Chart Reviewed Yes   Family/Caregiver Present No   Home Living   Type of Home House   Lives With Family  ("I live with my baby sister.")   Home Access Ramped entrance   Prior Function   Level of Independence Independent with ADLs and functional transfers   Prior Function Comments   (uses rolling walker)   Cognitive Assessment/Interventions   Behavior/Mood Observations behavior appropriate to situation, WNL/WFL  Orientation Status oriented x 4   Attention WNL/WFL   Follows Commands WFL   Vision Assessment/Interventions   Visual Impairment/Limitations WFL   RUE Assessment   RUE Assessment X- Exceptions   RUE ROM wfl   RUE Strength 4/5   LUE Assessment   LUE Assessment X-Exceptions   LUE ROM wfl   LUE Strength 4/5   Pain Assessment   Additional Documentation   (No reports of pain)   Bed Mobility Assessment/Treatment   Supine-Sit Independence contact guard assist   Transfer Assessment/Treatment   Toilet Transfer Independence contact guard assist   Bathing Assessment/Training   Independence Level minimum assist (75% patient effort)   Upper Body Dressing Assessment/Training   Independence Level  set up required   Lower Body Dressing Assessment/Training   Independence Level  contact guard assist;minimum assist (75% patient effort)   Toileting Assessment/Training   Independence Level  contact guard assist;minimum assist (75% patient effort)   Grooming Assessment/Training   Independence Level set up required   Self-Feeding Assessment/Training   Independence Level set up required   Assessment   Assessment Pt would benefit from skilled OT intervention to increase I.    Prognosis Good   Problem List Decreased ADL status;Decreased upper extremity strength;Decreased functional  mobility   Activity Tolerance   Activity Tolerance Patient tolerated treatment well   OT Plan   Treatment Interventions ADL Retraining;Functional Transfer Training;UE Strengthening/ROM;Patient/Family Training;Compensatory Technique Education;Continued Evaluation   OT Frequency 5x/wk   Duration  until discharge   Plan   Plan Y       ASSESSMENT     Pt is very cooperative and pleasant and is expected to benefit from skilled OT intervention so she can return home to PLOF.     GOALS   STG/ Time frame:  In 2 weeks, pt will:  1. Increase bue strength 4+/5 prn for ADL/funct mobs  2. Complete Am ADL with mod I to increase I with ADL  3. Perform bathroom xfers with mod I to increase I with funct mobs    LTG/Time frame: In 4 weeks, pt will:  1. Be I with HEP/recommendations.     PLAN OF CARE   OT services for 5 times/week until discharge to increase I so pt can return home to PLOF. Discussed established POC with treating COTAs and available for questions prn.    The risks/benefits of therapy have been discussed with the patient and he/she is in agreement with the established plan of care.    [place], OTR/L #[id number]  04/17/2019  11:12    By signing this, I certify that I have read and agree with the therapists recommendations.  This will serve as a new order for the above patient.  Any changes are noted in writing on this paper.    ___________________________________________________________________  [id number] Signature/Date

## 2019-04-17 NOTE — Care Plan (Signed)
[facility]  Rehabilitation Services  Physical Therapy Progress Note    Patient Name: [name]  Date of Birth: 1946/12/06  Height:  157.5 cm (5\' 2" )  Weight:  66.9 kg (147 lb 7 oz)  Room/Bed: 1125/A  Payor: MEDICARE / Plan: MEDICARE PART A AND B / Product Type: Medicare /     Assessment:     Patient is progressing well with PT. She is independent on her bed-mobility, close by supervision/SBA x 1 on transfers and gait. She can ambulate without any AD and uses a FWW on her own going to and from the bathroom. She is tolerating 2# load to BLE's and a 1# load and green band on BUE's without any difficulty.      Plan:   Continue to follow patient according to established plan of care.  The risks/benefits of therapy have been discussed with the patient/caregiver and he/she is in agreement with the established plan of care.     Subjective & Objective:   S: " I can feel it in my calves." No complain of pain.     04/17/19 1135   PT Last Visit   PT Received On 04/17/19  ([id number])   Toilet Use   Did the resident use the toilet? Yes   Did the resident need help using the toilet? Setup help only   Did you supervise the resident using the toilet? I supervised the activity   Personal Hygiene   Did the resident do personal hygiene activities? Yes   Did the resident need help with personal hygiene? Didn't need help   Did you supervise the resident doing personal hygiene activities? I supervised the activity   Bed Mobility   Did the resident have bed mobility? Yes   Did the resident need help moving in bed? Didn't need help   Did you supervise the resident moving in bed? I supervised the activity   Transfer   Did the resident transfer? Yes   Did the resident need help to transfer? Setup help only   Did you supervise the resident transferring? I supervised the activity   Walk in Room   Did the resident walk in the room? Yes   Did the resident need help walking in the room? Setup help only   Did you  supervise the resident walking in the room? I supervised the activity   Walk in [place]   Did the resident walk in the hall? No  (patient on contact precautions)   Cognition   Overall Cognitive Status WFL   Seated   Seated-Exercises Lower extremity;Upper extremity   Seated-Exercise Type Ankle pumps;Hip flexion;Long arc quads;Knee flex;ABduction;ADduction   Repetitions 30   Weight/Resistance 2 pounds;green   Standing   Standing-Exercises Lower extremity;Specific exercises   Standing-Exercise Type Hip flexion;Hip extension;ABduction;ADduction;Knee flexion;Squats;Heel raises   Repetitions 30   Bed Mobility   Bed Mobility Yes   Bed Mobility 1   Bed Mobility From 1 Supine   Bed Mobility Type 1 To   Bed Mobility to 1 Short sit   Level of Assistance 1 Independent   Transfer 1   Transfer From 1 Bed   Transfer Type 1 To   Transfer to 1 Commode-standard   Technique 1 Stand to sit;Sit to stand   Transfer Device 1 FWW   Transfer Level of Assistance 1 Close supervision   Transfers 2   Transfer From 2 Commode-standard   Transfer Type 2 To   Transfer to 2 Chair with arms  Technique 2 Sit to stand;Stand to sit   Transfer Device 2 FWW   Transfers 3   Transfer From 3 Chair with arms   Transfer Type 3 To and from   Transfer to 3 Stand   Technique 3 Stand to sit;Sit to stand   Transfer Device 3 FWW   Transfer Level of Assistance 3 Close supervision   Ambulation   Ambulation Yes   Ambulation 1   Surface 1 Level tile   Device 1 Rolling walker   Other Apparatus 1 Other (Comment)   Quality of Gait 1 slow cadence   Comments/Distance (ft) 1 25 x 2   Ambulation 2   Surface 2 Level tile   Device 2 No device   Assistance 2 Close supervision   Quality of Gait 2 increased BOS, slight increase in lateral shift of trunk   Comments/Distance (ft) 2 25 x 2, 5 x 7 FT   Other Comments   Other PT Comments Continue with current POC   Recommendation   PT - Next Appointment 04/17/19       Therapist:   Jessie Foot, PT, DPT

## 2019-04-18 MED ORDER — VALACYCLOVIR 500 MG TABLET
500.00 mg | ORAL_TABLET | Freq: Three times a day (TID) | ORAL | Status: DC
Start: 2019-04-18 — End: 2019-04-19
  Administered 2019-04-18 – 2019-04-19 (×3): 500 mg via ORAL
  Filled 2019-04-18 (×3): qty 1

## 2019-04-18 MED ORDER — SODIUM CHLORIDE 0.9 % INTRAVENOUS PIGGYBACK
1.0000 g | INTRAVENOUS | Status: DC
Start: 2019-04-18 — End: 2019-04-19
  Administered 2019-04-18: 1 g via INTRAVENOUS
  Administered 2019-04-18 – 2019-04-19 (×2): 0 g via INTRAVENOUS
  Administered 2019-04-19: 1 g via INTRAVENOUS
  Filled 2019-04-18 (×4): qty 10

## 2019-04-18 MED ADMIN — lipase-protease-amylase 36,000-114,000-180,000 unit capsule,delay rel: ORAL | @ 08:00:00

## 2019-04-18 MED ADMIN — sodium chloride 1 gram tablet: ORAL | @ 17:00:00

## 2019-04-18 MED ADMIN — montelukast 10 mg tablet: ORAL | @ 22:00:00

## 2019-04-18 MED ADMIN — hydrALAZINE 10 mg tablet: ORAL | @ 22:00:00

## 2019-04-18 MED ADMIN — sodium chloride 1 gram tablet: ORAL | @ 09:00:00

## 2019-04-18 MED ADMIN — sodium chloride 0.9 % (flush) injection syringe: @ 14:00:00

## 2019-04-18 MED ADMIN — hydrALAZINE 10 mg tablet: ORAL | @ 14:00:00

## 2019-04-18 MED ADMIN — apixaban 5 mg tablet: ORAL | @ 22:00:00

## 2019-04-18 MED ADMIN — Medication: INTRAVENOUS | @ 10:00:00

## 2019-04-18 MED ADMIN — hydrALAZINE 10 mg tablet: ORAL | @ 06:00:00

## 2019-04-18 MED ADMIN — insulin glargine (U-100) 100 unit/mL (3 mL) subcutaneous pen: SUBCUTANEOUS | @ 21:00:00

## 2019-04-18 MED ADMIN — insulin lispro 100 unit/mL subcutaneous solution: SUBCUTANEOUS | @ 17:00:00

## 2019-04-18 MED ADMIN — losartan 50 mg tablet: ORAL | @ 09:00:00

## 2019-04-18 MED ADMIN — insulin lispro 100 unit/mL subcutaneous solution: SUBCUTANEOUS | @ 12:00:00

## 2019-04-18 MED ADMIN — cetirizine 10 mg tablet: ORAL | @ 09:00:00

## 2019-04-18 MED ADMIN — atorvastatin 10 mg tablet: ORAL | @ 22:00:00

## 2019-04-18 MED ADMIN — aspirin 81 mg tablet,delayed release: ORAL | @ 09:00:00

## 2019-04-18 MED ADMIN — sodium chloride 0.9 % (flush) injection syringe: @ 06:00:00

## 2019-04-18 MED ADMIN — sodium chloride 0.9 % (flush) injection syringe: @ 22:00:00

## 2019-04-18 MED ADMIN — apixaban 5 mg tablet: ORAL | @ 09:00:00

## 2019-04-18 MED ADMIN — metoprolol succinate ER 25 mg tablet,extended release 24 hr: ORAL | @ 09:00:00

## 2019-04-18 NOTE — Nurses Notes (Signed)
PM meds taken po without difficulty. Accucheck 248 mg/dl. 4 units of Humalog given sc in abdomen. SAT 98% on RA. Resting in bed. Encouraged to use call light for assist.

## 2019-04-18 NOTE — Nurses Notes (Signed)
Pt with complaints of "fever blisters" to both lips.  Dr.S [name] notified and new orders received.

## 2019-04-18 NOTE — Care Plan (Signed)
[facility]  Rehabilitation Services  Physical Therapy Progress Note    Patient Name: [name]  Date of Birth: 12-Jul-1946  Height:  157.5 cm (5\' 2" )  Weight:  66.9 kg (147 lb 7 oz)  Room/Bed: 1125/A  Payor: MEDICARE / Plan: MEDICARE PART A AND B / Product Type: Medicare /     Assessment:      Pt tolerated session well. No c/o fatigue with exercises and activities. No LOB with gait using RW.    Discharge Needs:       Plan:   Continue to follow patient according to established plan of care. PRACTICE GAIT WITH LRAD.  The risks/benefits of therapy have been discussed with the patient/caregiver and he/she is in agreement with the established plan of care.     Subjective & Objective:        04/18/19 1145   PT Last Visit   PT Received On 04/18/19  ([id number])   Toilet Use   Did the resident use the toilet? Yes   Did the resident need help using the toilet? Didn't need help   Did you supervise the resident using the toilet? I supervised the activity   Personal Hygiene   Did the resident do personal hygiene activities? Yes   Did the resident need help with personal hygiene? Didn't need help   Did you supervise the resident doing personal hygiene activities? I supervised the activity   Balance   Balance Yes   Static Sitting Balance   Static Sitting-Balance Support No upper extremity supported;Feet supported   Static Sitting-Level of Assistance Independent   Static Sitting-Comment/# of Minutes edge of chair sitting during ex performance for trunk control, core strength   Seated   Seated-Exercises Lower extremity  (BLE)   Seated-Exercise Type Ankle pumps;Hip flexion;Glut sets;Long arc quads;Knee flex;ABduction;ADduction  (isom abs)   Repetitions 30  (3x10)   Weight/Resistance green  (pillow squeeze hip add)   Standing   Standing-Exercises Lower extremity  (BLE)   Standing-Exercise Type Hip flexion;Hip extension;ABduction;Knee flexion;Toe raises;Squats  (marching in place)   Repetitions 30  (3x10)     Bed Mobility   Bed Mobility Yes   Bed Mobility 1   Bed Mobility From 1 Supine   Bed Mobility Type 1 To   Bed Mobility to 1 Short sit   Level of Assistance 1 Independent   Transfers   Transfer Yes   Transfer 1   Transfer From 1 Bed   Transfer Type 1 To   Transfer to 1 Stand   Technique 1 Sit to stand   Transfer Device 1 none   Transfer Level of Assistance 1 Independent;Distant supervision   Trials/Comments 1 RW within reaching distance   Transfers 2   Transfer From 2 Chair with arms   Transfer Type 2 To and from   Transfer to 2 Chair with arms   Technique 2 Sit to stand;Stand to sit  (multiple transfers)   Transfer Device 2 none   Transfer Level of Assistance 2 Independent;Distant supervision   Trials/Comments 2 RW within reaching distance   Transfers 3   Transfer From 3 Toilet   Transfer Type 3 To and from   Transfer to 3 Toilet   Technique 3 Sit to stand;Stand to sit   Transfer Device 3 none   Transfer Level of Assistance 3 Independent;Distant supervision   Trials/Comments 3 RW within reaching distance   Ambulation   Ambulation Yes   Ambulation 1   Surface 1 Level  tile   Device 1 Rolling walker   Assistance 1 Independent   Quality of Gait 1 slow, steady gait   Comments/Distance (ft) 1 in room distance, and 30' x 2        Therapist:   Christ Kick, PTA

## 2019-04-18 NOTE — Care Plan (Signed)
Problem: Infection  Goal: Infection Symptom Resolution  Outcome: Ongoing (see interventions/notes)     Will have no temp greater than 100.0 this shift  Goal met

## 2019-04-18 NOTE — Nurses Notes (Signed)
24 hour chart check completed

## 2019-04-18 NOTE — Nurses Notes (Signed)
Spoke with Dr.S.Wantz to clarify gentamycin order.  Orders received to stop gentamycin and start rocephin 1gm iv daily.

## 2019-04-18 NOTE — OT Treatment (Signed)
[facility]  Occupational Therapy Treatment Note      Patient Name: [name]  Date of Birth: 02/12/1946  Height: Height: 157.5 cm (5\' 2" )  Weight: Weight: 66.9 kg (147 lb 7 oz)  Room/Bed: 1125/A  Payor: MEDICARE / Plan: MEDICARE PART A AND B / Product Type: Medicare /     Assessment:        Res demo good tol to OT tx with good progress towards goals.      Plan:     Continue with OT POC to maximize rehab potential      The risks/benefits of therapy have been discussed with the patient/caregiver and he/she is in agreement with the established plan of care.       Subjective & Objective     Res supine in bed agreeable to OT intervention. "I am ready to get healthy and go home."       04/18/19 1100   General   Treatment Duration 30 Minutes   Family/Caregiver Present No   ADL   Where Assessed Supine, bet   Bed Mobility   Did the resident have bed mobility? Yes   Did the resident need help moving in bed? Didn't need help   Did you supervise the resident moving in bed? I supervised the activity   Transfer   Did the resident transfer? Yes   Did the resident need help to transfer? Didn't need help   Did you supervise the resident transferring? I supervised the activity   Walk in Room   Did the resident walk in the room? Yes   Did the resident need help walking in the room? Didn't need help   Did you supervise the resident walking in the room? I supervised the activity   Therapeutic Exercise - Strength   Strength Yes   Exercise Tools   Exercise Tools Yes  (B UE strengthening to improve ADL I and fxl txfs)   Theraband   (Red tband ex in all planes 20x2)   Hand Gripper   (green digi flex 40x2)   Coordination   Gross Motor   (Good)   Cognition   Arousal/Alertness WFL   Orientation Level Oriented to time;Oriented to situation;Oriented to person   Activity Tolerance   Activity Tolerance Patient tolerated treatment well         Therapist:   Janna Arch, COTA   Pager #:  [phone]

## 2019-04-19 ENCOUNTER — Ambulatory Visit: Payer: Medicare Other | Attending: FAMILY MEDICINE | Admitting: FAMILY MEDICINE

## 2019-04-19 DIAGNOSIS — R51 Headache: Secondary | ICD-10-CM | POA: Insufficient documentation

## 2019-04-19 DIAGNOSIS — E785 Hyperlipidemia, unspecified: Secondary | ICD-10-CM | POA: Insufficient documentation

## 2019-04-19 DIAGNOSIS — Z794 Long term (current) use of insulin: Secondary | ICD-10-CM | POA: Insufficient documentation

## 2019-04-19 DIAGNOSIS — I110 Hypertensive heart disease with heart failure: Secondary | ICD-10-CM | POA: Insufficient documentation

## 2019-04-19 DIAGNOSIS — Z882 Allergy status to sulfonamides status: Secondary | ICD-10-CM | POA: Insufficient documentation

## 2019-04-19 DIAGNOSIS — A419 Sepsis, unspecified organism: Principal | ICD-10-CM | POA: Insufficient documentation

## 2019-04-19 DIAGNOSIS — B9620 Unspecified Escherichia coli [E. coli] as the cause of diseases classified elsewhere: Secondary | ICD-10-CM | POA: Insufficient documentation

## 2019-04-19 DIAGNOSIS — Z7982 Long term (current) use of aspirin: Secondary | ICD-10-CM | POA: Insufficient documentation

## 2019-04-19 DIAGNOSIS — Z66 Do not resuscitate: Secondary | ICD-10-CM | POA: Insufficient documentation

## 2019-04-19 DIAGNOSIS — E222 Syndrome of inappropriate secretion of antidiuretic hormone: Secondary | ICD-10-CM | POA: Insufficient documentation

## 2019-04-19 DIAGNOSIS — Z79891 Long term (current) use of opiate analgesic: Secondary | ICD-10-CM | POA: Insufficient documentation

## 2019-04-19 DIAGNOSIS — N390 Urinary tract infection, site not specified: Secondary | ICD-10-CM | POA: Insufficient documentation

## 2019-04-19 DIAGNOSIS — J449 Chronic obstructive pulmonary disease, unspecified: Secondary | ICD-10-CM | POA: Insufficient documentation

## 2019-04-19 DIAGNOSIS — C259 Malignant neoplasm of pancreas, unspecified: Secondary | ICD-10-CM | POA: Insufficient documentation

## 2019-04-19 DIAGNOSIS — R918 Other nonspecific abnormal finding of lung field: Secondary | ICD-10-CM | POA: Insufficient documentation

## 2019-04-19 DIAGNOSIS — B001 Herpesviral vesicular dermatitis: Secondary | ICD-10-CM | POA: Insufficient documentation

## 2019-04-19 DIAGNOSIS — Z79899 Other long term (current) drug therapy: Secondary | ICD-10-CM | POA: Insufficient documentation

## 2019-04-19 DIAGNOSIS — E119 Type 2 diabetes mellitus without complications: Secondary | ICD-10-CM | POA: Insufficient documentation

## 2019-04-19 DIAGNOSIS — I509 Heart failure, unspecified: Secondary | ICD-10-CM | POA: Insufficient documentation

## 2019-04-19 MED ORDER — ASPIRIN 81 MG TABLET,DELAYED RELEASE
81.0000 mg | DELAYED_RELEASE_TABLET | Freq: Every day | ORAL | Status: DC
Start: 2019-04-20 — End: 2020-08-26

## 2019-04-19 MED ORDER — CEFUROXIME AXETIL 500 MG TABLET
500.00 mg | ORAL_TABLET | Freq: Two times a day (BID) | ORAL | 0 refills | Status: DC
Start: 2019-04-19 — End: 2019-07-06

## 2019-04-19 MED ORDER — LANTUS SOLOSTAR U-100 INSULIN 100 UNIT/ML (3 ML) SUBCUTANEOUS PEN
8.00 [IU] | PEN_INJECTOR | Freq: Every evening | SUBCUTANEOUS | Status: DC
Start: 2019-04-19 — End: 2019-11-25

## 2019-04-19 MED ORDER — HYDRALAZINE 10 MG TABLET
10.00 mg | ORAL_TABLET | Freq: Three times a day (TID) | ORAL | Status: AC
Start: 2019-04-19 — End: ?

## 2019-04-19 MED ORDER — HYDROCODONE 5 MG-ACETAMINOPHEN 325 MG TABLET
1.00 | ORAL_TABLET | ORAL | 0 refills | Status: DC | PRN
Start: 2019-04-19 — End: 2019-10-07

## 2019-04-19 MED ORDER — ATORVASTATIN 10 MG TABLET
10.00 mg | ORAL_TABLET | Freq: Every evening | ORAL | Status: DC
Start: 2019-04-19 — End: 2020-01-13

## 2019-04-19 MED ORDER — METOPROLOL SUCCINATE ER 25 MG TABLET,EXTENDED RELEASE 24 HR
25.0000 mg | ORAL_TABLET | Freq: Every day | ORAL | Status: DC
Start: 2019-04-20 — End: 2020-02-06

## 2019-04-19 MED ORDER — CETIRIZINE 10 MG TABLET
10.0000 mg | ORAL_TABLET | Freq: Every day | ORAL | Status: DC
Start: 2019-04-20 — End: 2020-08-26

## 2019-04-19 MED ORDER — APIXABAN 2.5 MG TABLET
2.50 mg | ORAL_TABLET | Freq: Two times a day (BID) | ORAL | Status: DC
Start: 2019-04-19 — End: 2019-11-14

## 2019-04-19 MED ORDER — CEFUROXIME AXETIL 250 MG TABLET
500.00 mg | ORAL_TABLET | Freq: Two times a day (BID) | ORAL | Status: DC
Start: 2019-04-19 — End: 2019-04-19

## 2019-04-19 MED ORDER — VALACYCLOVIR 500 MG TABLET
500.00 mg | ORAL_TABLET | Freq: Three times a day (TID) | ORAL | 0 refills | Status: AC
Start: 2019-04-19 — End: 2019-04-23

## 2019-04-19 MED ORDER — MONTELUKAST 10 MG TABLET
10.00 mg | ORAL_TABLET | Freq: Every evening | ORAL | Status: DC
Start: 2019-04-19 — End: 2020-01-13

## 2019-04-19 MED ADMIN — cetirizine 10 mg tablet: ORAL | @ 09:00:00

## 2019-04-19 MED ADMIN — sodium chloride 1 gram tablet: @ 12:00:00

## 2019-04-19 MED ADMIN — sodium chloride 0.9 % (flush) injection syringe: @ 06:00:00

## 2019-04-19 MED ADMIN — lipase-protease-amylase 36,000-114,000-180,000 unit capsule,delay rel: @ 12:00:00

## 2019-04-19 MED ADMIN — valACYclovir 500 mg tablet: ORAL | @ 09:00:00

## 2019-04-19 MED ADMIN — insulin lispro 100 unit/mL subcutaneous solution: SUBCUTANEOUS | @ 06:00:00

## 2019-04-19 MED ADMIN — lipase-protease-amylase 36,000-114,000-180,000 unit capsule,delay rel: ORAL | @ 09:00:00

## 2019-04-19 MED ADMIN — metoprolol succinate ER 25 mg tablet,extended release 24 hr: ORAL | @ 09:00:00

## 2019-04-19 MED ADMIN — Medication: INTRAVENOUS | @ 09:00:00

## 2019-04-19 MED ADMIN — cefuroxime axetil 250 mg tablet: @ 11:00:00

## 2019-04-19 MED ADMIN — pantoprazole 40 mg tablet,delayed release: ORAL | @ 06:00:00

## 2019-04-19 MED ADMIN — hydrALAZINE 10 mg tablet: ORAL | @ 06:00:00

## 2019-04-19 NOTE — Care Management Notes (Signed)
[facility]  Care Management Note    Patient Name: [name]  Date of Birth: May 20, 1946  Sex: female  Date/Time of Admission: 04/15/2019  2:00 PM  Room/Bed: 1125/A  Payor: MEDICARE / Plan: MEDICARE PART A AND B / Product Type: Medicare /    LOS: 4 days   Primary Care Providers:  Ignacia Felling, MD, MD (General)    Admitting Diagnosis:  UTI (urinary tract infection) [N39.0]    Assessment:   Patient requesting discharge to home.  I met with her at bedside and explained that her insurance would continue to pay for skilled therapy but she would like to go home with home health services.   Physician notified and will discharge today.      Discharge Plan:  Home with Home Health (code 6)      The patient will continue to be evaluated for developing discharge needs.     Case Manager: Gilmore Laroche  Phone: [phone]

## 2019-04-19 NOTE — Care Management Notes (Signed)
New order received for home health services.  Chooses [facility].  Chart info faxed at this time.

## 2019-04-19 NOTE — Discharge Summary (Signed)
[facility]  DISCHARGE SUMMARY      PATIENT NAME:  [name], [name]  MRN:  [id number]  DOB:  1946/01/10    INPATIENT ADMISSION DATE: 04/15/2019  DISCHARGE DATE:  04/19/2019    ATTENDING PHYSICIAN: Hyacinth Meeker,*  SERVICE: SMR SWING  PRIMARY CARE PHYSICIAN: Ignacia Felling, MD     Reason for Admission     Diagnosis    UTI (urinary tract infection) [956387]          DISCHARGE DIAGNOSIS:   Principal Problem:  Completion of IV abtic for E coli UTI with sepsis and PT/OT for weakness    Active Hospital Problems    Diagnosis Date Noted   . Sepsis (CMS [place]) [A41.9] 04/15/2019   . Lung nodules [R91.8] 04/15/2019   . UTI (urinary tract infection) [N39.0] 04/15/2019   . COPD (chronic obstructive pulmonary disease) (CMS HCC) [J44.9] 04/13/2019   . Essential hypertension [I10] 04/13/2019   . DNR (do not resuscitate) [Z66] 04/13/2019   . Hyperlipidemia [E78.5] 04/13/2019   . 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 Urinary tract infection [N39.0] 04/13/2019   . Headache [R51] 10/24/2018   . Pancreatic cancer (CMS [place]) [C25.9] 10/25/2017   . Type 2 diabetes mellitus (CMS [place]) [E11.9] 01/24/2017      Resolved Hospital Problems   No resolved problems to display.     Active Non-Hospital Problems    Diagnosis Date Noted   . CHF (congestive heart failure) (CMS HCC) 04/13/2019   . Hyponatremia 10/24/2018   . Anasarca 08/25/2018   . PE (pulmonary thromboembolism) (CMS HCC) 03/29/2018   . Hypokalemia 11/23/2017   . Open abdominal wall wound 11/22/2017   . Asthma 01/24/2017     Allergies   Allergen Reactions   . Sulfa (Sulfonamides) Itching             DISCHARGE MEDICATIONS:     Current Discharge Medication List      START taking these medications.      Details   cefuroxime 500 mg Tablet  Commonly known as:  CEFTIN   500 mg, Oral, 2 TIMES DAILY  Qty:  6 Tab  Refills:  0     valACYclovir 500 mg Tablet  Commonly known as:  VALTREX   500 mg, Oral, 3 TIMES DAILY  Qty:  12  Tab  Refills:  0        CONTINUE these medications which have CHANGED during your visit.      Details   aspirin 81 mg Tablet, Delayed Release (E.C.)  Commonly known as:  ECOTRIN  Start taking on:  Apr 20, 2019  What changed:  when to take this   81 mg, Oral, DAILY  Refills:  0     atorvastatin 10 mg Tablet  Commonly known as:  LIPITOR  What changed:  when to take this   10 mg, Oral, EVERY EVENING  Refills:  0     cetirizine 10 mg Tablet  Commonly known as:  ZYRTEC  Start taking on:  Apr 20, 2019  What changed:  when to take this   10 mg, Oral, DAILY  Refills:  0     hydrALAZINE 10 mg Tablet  Commonly known as:  APRESOLINE  What changed:  when to take this   10 mg, Oral, EVERY 8 HOURS (SCHEDULED)  Refills:  0     HYDROcodone-acetaminophen 5-325 mg Tablet  Commonly known as:  [place]  What changed:  reasons to take this   1 Tab, Oral, EVERY 4 HOURS PRN  Refills:  0     hydrocortisone-pramoxine 1-1 % Foam  Commonly known as:  PROCTOFOAM HC  What changed:     when to take this   reasons to take this   1 Applicator, Rectal, EVERY 6 HOURS  Qty:  10 g  Refills:  0     Lantus Solostar U-100 Insulin 100 unit/mL Insulin Pen  Generic drug:  insulin glargine  What changed:  medication strength   8 Units, Subcutaneous, NIGHTLY  Refills:  0     metoprolol succinate 25 mg Tablet Sustained Release 24 hr  Commonly known as:  TOPROL-XL  Start taking on:  Apr 20, 2019  What changed:  medication strength   25 mg, Oral, DAILY  Refills:  0     montelukast 10 mg Tablet  Commonly known as:  SINGULAIR  What changed:  when to take this   10 mg, Oral, EVERY EVENING  Refills:  0        CONTINUE these medications - NO CHANGES were made during your visit.      Details   apixaban 2.5 mg Tablet  Commonly known as:  ELIQUIS   2.5 mg, Oral, 2 TIMES DAILY  Refills:  0     furosemide 20 mg Tablet  Commonly known as:  LASIX   40 mg, Oral, DAILY  Qty:  60 Tab  Refills:  0     insulin aspart U-100 100 unit/mL (3 mL) Insulin Pen  Commonly known as:  NovoLOG  Flexpen U-100 Insulin   5 Units, Subcutaneous, 2 TIMES DAILY BEFORE MEALS  Qty:  1 Each  Refills:  11     LORazepam 0.5 mg Tablet  Commonly known as:  ATIVAN   0.5 mg, DAILY  Refills:  0     losartan 100 mg Tablet  Commonly known as:  COZAAR   100 mg, Oral, DAILY  Refills:  0     omeprazole 40 mg Capsule, Delayed Release(E.C.)  Commonly known as:  PRILOSEC   40 mg, Oral, DAILY  Qty:  90 Cap  Refills:  4     ondansetron 4 mg Tablet  Commonly known as:  ZOFRAN   4 mg, Oral, EVERY 6 HOURS PRN  Refills:  0     pancreatic enzyme replacement 12,000-38,000 -60,000 unit Capsule, Delayed Release(E.C.)  Commonly known as:  CREON   Oral, 3 TIMES DAILY WITH MEALS, 2 CAPS WITH MEALS AND 1 CAP WITH EACH SNACK   Refills:  0     sodium chloride 1 gram Tablet   1 g, Oral, 3 TIMES DAILY WITH MEALS  Qty:  90 Tab  Refills:  2        STOP taking these medications.    acetaminophen 325 mg Tablet  Commonly known as:  TYLENOL     dextrose 50% in water 50% Syringe     gentamicin 240 mg in NS 106 mL infusion     insulin lispro 100 units/mL Injectable  Commonly known as:  HUMALOG     magnesium hydroxide 400 mg/5 mL Suspension  Commonly known as:  MILK OF MAGNESIA     ondansetron 2 mg/mL Solution  Commonly known as:  ZOFRAN             PROCEDURES: PT/OT, IV abtics    CONSULTATIONS: none    PERTINENT LABORATORY STUDIES:   Results for orders placed or performed during the  hospital encounter of 04/15/19 (from the past 72 hour(s))   GENTAMICIN, TROUGH, SERUM   Result Value Ref Range    GENTAMICIN TROUGH 0.6 0.6 - 2.0 ug/mL       Micro: No results found for any visits on 04/15/19 (from the past 96 hour(s)).    Radiology:       HOSPITAL COURSE:  Pt is a 73 y/o white female who Dr. Karmen Bongo had admitted to acute care for E coli sepsis/UTI and weakness.   Pt required 7 days IV abtics and was admitted to [facility] for this and for PT/OT due to weakness with the current illness.   Pt was given IV Gentamycin for 6 days and then was changed to IV  Rocephin due to not having to have levels drawn and less possibility for kidney toxicity.   Pt has received 9 days of IV abtics.   The blood and urine cultures showed E coli sensitive to Gentamycin, Cephelosporins, and every drug tested.   Pt worked with PT/OT and feels ready to go home.   Pt was started of Valtrex for fever blisters on her mouth.   Pt states that she lives with a sister who can help her.  Pt uses walker at home.   Pt states that she has sinus problems.   On PE, pt has 2 drying fever blisters on her mouth.   Heart has RRR with 1/6 SM.  Lungs are clear with scattered faint end expiratory wheeze.   Abd is soft and non-tender.   Extremities have no pedal edema.   Pt will finish 3 days of Cefuroxime and Valtrex.   Pt will have [facility] PT and nurse eval to report to Dr. Karmen Bongo.   Pt states that since Dr. Karmen Bongo cared for her in the hospital, she wants to follow up with him.   Apt in 1 week with Dr. Karmen Bongo.   RTC to ER prn.   I discussed the CT scan showing lung nodule to be followed up in 3-6 months.  Dr. Karmen Bongo is aware of the lung nodule.   Pt wants to continue to be a DNR/DNI and signed the verification of do not resuscitate order.      DISCHARGE INSTRUCTIONS:  Follow-up Information     Myrene Buddy, MD In 1 week.    Specialty:  FAMILY MEDICINE  Contact information:  [address]  [place] [address]  [phone]             Myrene Buddy, MD. Schedule an appointment as soon as possible for a visit in 1 week.    Specialty:  FAMILY MEDICINE  Why:  follow up hospitalization  Contact information:  [address]  [place] [address]  [phone]                    Refer to [place]     Refer to [place] - DIET     Diet: [place] - ACTIVITY     Activity: AS TOLERATED             CONDITION ON DISCHARGE:  A. Ambulation: Full ambulation  B. Self-care Ability: Complete  C.  Cognitive Status Alert  D. DNR status at discharge: No CPR    DISCHARGE DISPOSITION:    DISCHARGE PATIENT   Ordered at: 04/19/19  3435     Is there a planned readmission to acute care within 30 days?    No     Disposition:    HOME/SELF CARE/WITH FAMILY MEMBER/OTHER        Advance Directive Information      Most Recent Value   Does the Patient have an Advance Directive?  Yes, Patient Does Have Advance Directive for Healthcare Treatment   Type of Advance Directive Completed  DNR Card   Copy of Advance Directives in Chart?  Yes, Copy on Chart.(Specify in [place] Directive)   Name of [facility] or [place]   Phone Number of [place] or Healthcare Surrogate  [phone]                      PATIENT CARE TEAM:  Copies sent to Care Team       Relationship Specialty Notifications Start End    Ignacia Felling, MD PCP - General FAMILY MEDICINE  10/22/18     Phone: [phone] Fax: [phone]         [address] [place] [address]           Hyacinth Meeker, MD        Referring providers can utilize [email or web address] to access their referred [place] patient's information.

## 2019-04-19 NOTE — Nurses Notes (Signed)
Discharged via w/c in stable condition to front entrance by T.Russell cna.

## 2019-04-19 NOTE — Nurses Notes (Signed)
24 hour chart check completed

## 2019-04-19 NOTE — Care Management Notes (Signed)
[facility] Management Note    Patient Name: [name]  Date of Birth: Apr 11, 1946  Sex: female  Date/Time of Admission: 04/15/2019  2:00 PM  Room/Bed: 1125/A  Payor: MEDICARE / Plan: MEDICARE PART A AND B / Product Type: Medicare /    LOS: 4 days   Primary Care Providers:  Ignacia Felling, MD, MD (General)    Admitting Diagnosis:  UTI (urinary tract infection) [N39.0]    Assessment:      04/19/19 0900   ADVANCE DIRECTIVES   Does the Patient have an Advance Directive? Yes, Patient Does Have Advance Directive for Healthcare Treatment   Document the Substance of the Advance Directive (Required) DNR   Type of Advance Directive Completed DNR Card   Copy of Advance Directives in Chart? 0     DNR card completed with physician per patient request.      Discharge Plan:  Home with Home Health (code 6)      The patient will continue to be evaluated for developing discharge needs.     Case Manager: Gilmore Laroche  Phone: [phone]

## 2019-04-19 NOTE — Care Plan (Signed)
LTC Physical Therapy   Rehabilitation Services  Discharge Summary          Patient Name: [name]  Date of Birth: 1946-04-21  Height: Height: 157.5 cm ('5\' 2"' )  Weight: Weight: 67 kg (147 lb 12.8 oz)  Room/Bed: 1125/A  Payor: Payor: MEDICARE / Plan: MEDICARE PART A AND B / Product Type: Medicare /     Admission Diagnosis: UTI (urinary tract infection) [N39.0]      Assessment:      Most of her PT goals were met. She was limited in her ambulation distance secondary to her contact precautions otherwise she is independent with bed-mobility, transfers and room distance gait with the use of a FWW (PRN)      Plan:     Discharged to home 04-19-19     Evaluation:              04/19/19 1000   Rehab Session   Document Type discharge evaluation/summary   Gait Training  Goal, Distance to Achieve   Gait Training  Goal, Date Established 04/16/19   Gait Training  Goal, Time to Achieve by discharge   Gait Training  Goal, Independence Level modified independence   Gait Training  Goal, Assist Device walker, rolling;least restricted assistive device   Gait Training  Goal, Distance to Achieve 210   Gait Training  Goal, Date Goal Reviewed 04/19/19   Gait Training Goal, Outcome goal partially met   Strength Goal   Strength Goal, Date Established 04/16/19   Strength Goal, Time to Achieve by discharge   Strength Goal, Measure to Achieve increased strength of BLE's to 5/5   Strength Goal, Functional Goal Resist 2-3# load   Strength Goal, Date Goal Review 04/19/19   Strength Goal, Outcome goal met   Transfer Training Goal   Transfer Training Goal, Date Established 04/16/19   Transfer Training Goal, Time to Achieve by discharge   Transfer Training Goal, Activity Type bed-to-chair/chair-to-bed;sit-to-stand/stand-to-sit   Transfer Training Goal, Independence Level modified independence   Transfer Training Goal, Assist Device walker, rolling;least restrictrictive assistive device   Transfer Training Goal, Date Reviewed 04/19/19   Transfer  Training Goal, Outcome goal met   Physical Therapy Discharge Summary   Additional Documentation Discharge Summary (PT) (Group)   Discharge Summary, PT Eval   Reason for Discharge patient met all goals and outcomes;patient discharged from this facility   Outcomes Achieved all goals met within established timeframes               Jessie Foot, PT,DPT  04/19/2019, 15:13

## 2019-04-19 NOTE — Care Plan (Addendum)
Discharge inst given to pt. Aware of follow up appt with Dr.M [name] and new Rx's sent to pharmacy.  Reviewed meds with pt.  2 home meds returned to pt.  Able to verbalize discharge inst by teach back method.  No questions about discharge. Waiting on ride.

## 2019-04-22 ENCOUNTER — Other Ambulatory Visit: Payer: Self-pay

## 2019-04-22 ENCOUNTER — Telehealth (HOSPITAL_COMMUNITY): Payer: Self-pay

## 2019-04-22 NOTE — Care Plan (Signed)
LTC Occupational Therapy DISCHARGE SUMMARY  Rehabilitation Services      Patient Name: [name]  Date of Birth: 1946-06-17  Height: Height: 157.5 cm (5\' 2" )  Weight: Weight: 67 kg (147 lb 12.8 oz)  Room/Bed: 1125/A  Payor: Payor: MEDICARE / Plan: MEDICARE PART A AND B / Product Type: Medicare /     Admission Diagnosis: UTI (urinary tract infection) [N39.0]  Date of Discharge: 04/19/2019    Assessment:    While in swing bed, pt benefited from skilled OT intervention, including ADL retraining, therapeutic exercise and therapeutic activity.         Plan:     Pt discharged home with family support. Recommend OT home health eval      Evaluation:           04/22/19 0913   General   Type of Evaluation Discharge Evaluation   Chart Reviewed Yes       Status Toward Goals At Discharge:   1. B ue strength: 4/5  2. AM ADL: S-mod I  3. Bathroom xfers: Luevenia Maxin, OTR/L #[id number]  04/22/2019, 09:30

## 2019-04-22 NOTE — Nursing Note (Signed)
04/22/2019 1534--Attempted follow up regarding hospital stay on 04/15/2019.  No answer.

## 2019-04-26 ENCOUNTER — Other Ambulatory Visit: Payer: Self-pay

## 2019-04-26 ENCOUNTER — Other Ambulatory Visit (HOSPITAL_COMMUNITY): Payer: Self-pay | Admitting: FAMILY PRACTICE

## 2019-04-26 ENCOUNTER — Ambulatory Visit: Payer: Medicare Other | Attending: FAMILY PRACTICE

## 2019-04-26 DIAGNOSIS — R911 Solitary pulmonary nodule: Secondary | ICD-10-CM

## 2019-04-26 DIAGNOSIS — N390 Urinary tract infection, site not specified: Secondary | ICD-10-CM | POA: Insufficient documentation

## 2019-04-26 DIAGNOSIS — E119 Type 2 diabetes mellitus without complications: Secondary | ICD-10-CM | POA: Insufficient documentation

## 2019-04-26 DIAGNOSIS — I509 Heart failure, unspecified: Secondary | ICD-10-CM | POA: Insufficient documentation

## 2019-04-26 DIAGNOSIS — R531 Weakness: Secondary | ICD-10-CM | POA: Insufficient documentation

## 2019-04-26 LAB — URINALYSIS, MACROSCOPIC
BILIRUBIN: NEGATIVE mg/dL
BLOOD: NEGATIVE mg/dL
GLUCOSE: NEGATIVE mg/dL
KETONES: NEGATIVE mg/dL
LEUKOCYTES: NEGATIVE WBCs/uL
NITRITE: NEGATIVE
PH: 5.5 (ref 5.0–8.5)
PROTEIN: NEGATIVE mg/dL
SPECIFIC GRAVITY: 1.015 (ref 1.005–1.030)
UROBILINOGEN: 0.2 mg/dL

## 2019-04-26 LAB — CBC WITH DIFF
BASOPHIL #: <0.1 x10ˆ3/uL (ref <=0.20)
BASOPHIL %: 1 %
EOSINOPHIL #: 0.17 x10ˆ3/uL (ref <=0.50)
EOSINOPHIL %: 3 %
HCT: 38.1 % (ref 34.8–46.0)
HGB: 11.9 g/dL (ref 11.5–16.0)
IMMATURE GRANULOCYTE #: <0.1 x10ˆ3/uL (ref <0.10)
IMMATURE GRANULOCYTE %: 0 % (ref 0–1)
LYMPHOCYTE #: 1.02 x10ˆ3/uL (ref 1.00–4.80)
LYMPHOCYTE %: 20 %
MCH: 29.8 pg (ref 26.0–32.0)
MCHC: 31.2 g/dL (ref 31.0–35.5)
MCV: 95.3 fL (ref 78.0–100.0)
MONOCYTE #: 0.4 10*3/uL (ref 0.20–1.10)
MONOCYTE %: 8 %
MPV: 12.2 fL (ref 8.7–12.5)
NEUTROPHIL #: 3.42 x10ˆ3/uL (ref 1.50–7.70)
NEUTROPHIL %: 68 %
PLATELETS: 204 x10ˆ3/uL (ref 150–400)
RBC: 4 x10ˆ6/uL (ref 3.85–5.22)
RDW-CV: 14.5 % (ref 11.5–15.5)
WBC: 5.1 x10ˆ3/uL (ref 3.7–11.0)

## 2019-04-26 LAB — COMPREHENSIVE METABOLIC PANEL, NON-FASTING
ALBUMIN: 3.8 g/dL (ref 3.5–5.0)
ALKALINE PHOSPHATASE: 114 U/L (ref 38–126)
ALT (SGPT): 14 U/L (ref 0–34)
ANION GAP: 8 mmol/L (ref 8–16)
AST (SGOT): 23 U/L (ref 14–36)
BILIRUBIN TOTAL: 0.7 mg/dL (ref 0.2–1.3)
BUN/CREA RATIO: 19
BUN: 25 mg/dL — ABNORMAL HIGH (ref 7–17)
CALCIUM: 9.3 mg/dL (ref 8.4–10.2)
CHLORIDE: 100 mmol/L (ref 98–107)
CO2 TOTAL: 31 mmol/L — ABNORMAL HIGH (ref 22–30)
CREATININE: 1.3 mg/dL — ABNORMAL HIGH (ref 0.52–1.04)
ESTIMATED GFR: 41 mL/min/1.73mˆ2 — ABNORMAL LOW (ref >60)
GLUCOSE: 87 mg/dL (ref 74–106)
POTASSIUM: 4.3 mmol/L (ref 3.5–5.1)
PROTEIN TOTAL: 6.5 g/dL (ref 6.3–8.2)
SODIUM: 139 mmol/L (ref 137–145)

## 2019-04-26 LAB — URINALYSIS, MICROSCOPIC

## 2019-04-26 LAB — HGA1C (HEMOGLOBIN A1C WITH EST AVG GLUCOSE)
ESTIMATED AVERAGE GLUCOSE: 249 mg/dL
HEMOGLOBIN A1C: 10.3 % — ABNORMAL HIGH (ref 4.0–6.0)

## 2019-04-26 LAB — THYROID STIMULATING HORMONE (SENSITIVE TSH): TSH: 1.65 u[IU]/mL (ref 0.470–4.680)

## 2019-04-30 ENCOUNTER — Telehealth (HOSPITAL_COMMUNITY): Payer: Self-pay

## 2019-04-30 LAB — URINE CULTURE: URINE CULTURE: 35000 — AB

## 2019-04-30 NOTE — Nursing Note (Signed)
ToC Management    Date: 04/30/19  Time: 33  Contact made via: Phone  Spoke with: Patient  Call Outcome: Completed  Reviewed Med Chngs/Adds/Dele: Yes  Medications obtained: Yes  Med adher/comp concerns addressed: Yes  Adverse Effects: No  Pt/Caregiver aware Reason for Hosp: Yes, reinforced  Pt/Caregiver Aware Reasons Causing Return to [facility]: Yes, reinforced  Experiencing acute symptoms: No  Reviewed PCP Follow-up Appt: Appt scheduled  Appt Scheduled: Plans to attend, has transportation  Reviewed Post-Acute Agency/Service Orders: Yes  Agency/Service at Discharge: [facility] by Agency/Service: Yes, contact info reviewed

## 2019-05-08 ENCOUNTER — Telehealth (HOSPITAL_COMMUNITY): Payer: Self-pay

## 2019-05-08 NOTE — Nursing Note (Signed)
05/08/2019 1453--Unable to reach patient for hospital follow up.  No answer.

## 2019-05-16 ENCOUNTER — Other Ambulatory Visit: Payer: Medicare Other | Attending: FAMILY PRACTICE

## 2019-05-16 ENCOUNTER — Telehealth (HOSPITAL_COMMUNITY): Payer: Self-pay

## 2019-05-16 ENCOUNTER — Other Ambulatory Visit: Payer: Self-pay

## 2019-05-16 DIAGNOSIS — M549 Dorsalgia, unspecified: Secondary | ICD-10-CM

## 2019-05-16 DIAGNOSIS — R300 Dysuria: Secondary | ICD-10-CM | POA: Insufficient documentation

## 2019-05-16 LAB — URINALYSIS, MICROSCOPIC

## 2019-05-16 LAB — URINALYSIS, MACROSCOPIC
BILIRUBIN: NEGATIVE mg/dL
BLOOD: NEGATIVE mg/dL
GLUCOSE: NEGATIVE mg/dL
KETONES: NEGATIVE mg/dL
LEUKOCYTES: NEGATIVE WBCs/uL
NITRITE: NEGATIVE
PH: 5 (ref 5.0–8.5)
PROTEIN: NEGATIVE mg/dL
SPECIFIC GRAVITY: 1.015 (ref 1.005–1.030)
UROBILINOGEN: 0.2 mg/dL

## 2019-05-16 NOTE — Nursing Note (Signed)
05/16/2019 1223--Follow up call completed at this time.  Patient states her blood sugar was low this morning, otherwise doing well.  Home meds taken as directed.  Followed up with Dr Karmen Bongo as scheduled.  No new complaints or concerns.

## 2019-05-18 LAB — URINE CULTURE: URINE CULTURE: NO GROWTH

## 2019-05-27 ENCOUNTER — Ambulatory Visit (HOSPITAL_COMMUNITY): Payer: Medicare Other

## 2019-05-27 ENCOUNTER — Other Ambulatory Visit: Payer: Self-pay

## 2019-05-27 ENCOUNTER — Ambulatory Visit
Admission: RE | Admit: 2019-05-27 | Discharge: 2019-05-27 | Disposition: A | Discharge status: Home / Self Care | Payer: Medicare Other | Source: Ambulatory Visit | Attending: Family Medicine | Admitting: Family Medicine

## 2019-05-27 DIAGNOSIS — C259 Malignant neoplasm of pancreas, unspecified: Secondary | ICD-10-CM | POA: Insufficient documentation

## 2019-05-27 DIAGNOSIS — A419 Sepsis, unspecified organism: Secondary | ICD-10-CM

## 2019-05-27 DIAGNOSIS — N390 Urinary tract infection, site not specified: Secondary | ICD-10-CM | POA: Insufficient documentation

## 2019-05-27 LAB — URINALYSIS, MICROSCOPIC

## 2019-05-27 LAB — URINALYSIS, MACROSCOPIC
BILIRUBIN: NEGATIVE mg/dL
BLOOD: NEGATIVE mg/dL
GLUCOSE: NEGATIVE mg/dL
KETONES: NEGATIVE mg/dL
NITRITE: NEGATIVE
PH: 5.5 (ref 5.0–8.5)
PROTEIN: NEGATIVE mg/dL
SPECIFIC GRAVITY: 1.01 (ref 1.005–1.030)
UROBILINOGEN: 0.2 mg/dL

## 2019-05-27 MED ORDER — SODIUM CHLORIDE 0.9 % (FLUSH) INJECTION SYRINGE
10.00 mL | INJECTION | Freq: Three times a day (TID) | INTRAMUSCULAR | Status: DC
Start: 2019-05-27 — End: 2019-05-28
  Administered 2019-05-27: 11:00:00 10 mL via INTRAVENOUS

## 2019-05-27 MED ORDER — HEPARIN, PORCINE (PF) 100 UNIT/ML INTRAVENOUS SYRINGE
5.00 mL | INJECTION | Freq: Once | INTRAVENOUS | Status: AC
Start: 2019-05-27 — End: 2019-05-27
  Administered 2019-05-27: 11:00:00 5 mL
  Filled 2019-05-27: qty 5

## 2019-05-27 MED ADMIN — heparin, porcine (PF) 100 unit/mL intravenous syringe: @ 11:00:00

## 2019-05-27 MED ADMIN — sodium chloride 0.9 % (flush) injection syringe: INTRAVENOUS | @ 11:00:00

## 2019-05-27 NOTE — Nurses Notes (Signed)
Patient ambulated into OP using her cane for assist, caretaker accompanying her today for her scheduled port flush. Patient alert and oriented to person and place. Patient skin warm, dry and pink. Port site cleansed with chloraprep and allowed to dry to right upper chest area, accessed with 19g power pac needle, flushed with NS 61ml, good blood return, then flushed with heparin 50ml(100u/ml). Needle removed and dressing applied, no bleeding. Patient to call for next port flush appointment. Thelma Barge

## 2019-05-29 LAB — URINE CULTURE: URINE CULTURE: <10000 — AB

## 2019-06-25 MED ADMIN — aspirin 81 mg chewable tablet: @ 06:00:00

## 2019-06-25 MED ADMIN — albumin, human 5 % intravenous solution: INTRAVENOUS | @ 17:00:00 | NDC 00944049101

## 2019-06-26 ENCOUNTER — Other Ambulatory Visit: Payer: Self-pay

## 2019-06-26 ENCOUNTER — Ambulatory Visit
Admission: RE | Admit: 2019-06-26 | Discharge: 2019-06-26 | Disposition: A | Discharge status: Home / Self Care | Payer: Medicare Other | Source: Ambulatory Visit | Attending: FAMILY PRACTICE | Admitting: FAMILY PRACTICE

## 2019-06-26 DIAGNOSIS — R911 Solitary pulmonary nodule: Secondary | ICD-10-CM | POA: Insufficient documentation

## 2019-06-26 MED ORDER — IODIXANOL 320 MG IODINE/ML INTRAVENOUS SOLUTION
100.00 mL | INTRAVENOUS | Status: AC
Start: 2019-06-26 — End: 2019-06-26
  Administered 2019-06-26: 100 mL via INTRAVENOUS

## 2019-06-26 MED ADMIN — lactated Ringers intravenous solution: INTRAVENOUS | @ 14:00:00 | NDC 00338011704

## 2019-06-26 MED ADMIN — sodium chloride 0.9 % intravenous solution: INTRAVENOUS | @ 14:00:00

## 2019-06-26 MED ADMIN — lactated Ringers intravenous solution: INTRAVENOUS | @ 10:00:00 | NDC 00338011704

## 2019-06-26 MED ADMIN — morphine concentrate 10 mg/0.5 mL oral syringe (FOR ORAL USE ONLY): @ 09:00:00

## 2019-06-26 MED ADMIN — dextrose 5 % in water (D5W) intravenous solution: INTRAVENOUS | @ 06:00:00 | NDC 00338001704

## 2019-06-29 MED ADMIN — DULoxetine 30 mg capsule,delayed release: @ 09:00:00

## 2019-06-29 MED ADMIN — propafenone 150 mg tablet: @ 06:00:00

## 2019-07-06 ENCOUNTER — Emergency Department (HOSPITAL_COMMUNITY): Payer: Medicare Other

## 2019-07-06 ENCOUNTER — Emergency Department
Admission: EM | Admit: 2019-07-06 | Discharge: 2019-07-06 | Disposition: A | Discharge status: Home / Self Care | Payer: Medicare Other | Attending: Emergency Medicine | Admitting: Emergency Medicine

## 2019-07-06 ENCOUNTER — Other Ambulatory Visit: Payer: Self-pay

## 2019-07-06 ENCOUNTER — Encounter (HOSPITAL_COMMUNITY): Payer: Self-pay | Admitting: Emergency Medicine

## 2019-07-06 DIAGNOSIS — E7800 Pure hypercholesterolemia, unspecified: Secondary | ICD-10-CM | POA: Insufficient documentation

## 2019-07-06 DIAGNOSIS — E1165 Type 2 diabetes mellitus with hyperglycemia: Secondary | ICD-10-CM | POA: Insufficient documentation

## 2019-07-06 DIAGNOSIS — F329 Major depressive disorder, single episode, unspecified: Secondary | ICD-10-CM | POA: Insufficient documentation

## 2019-07-06 DIAGNOSIS — K219 Gastro-esophageal reflux disease without esophagitis: Secondary | ICD-10-CM | POA: Insufficient documentation

## 2019-07-06 DIAGNOSIS — E785 Hyperlipidemia, unspecified: Secondary | ICD-10-CM | POA: Insufficient documentation

## 2019-07-06 DIAGNOSIS — R519 Headache, unspecified: Secondary | ICD-10-CM

## 2019-07-06 DIAGNOSIS — R739 Hyperglycemia, unspecified: Secondary | ICD-10-CM

## 2019-07-06 DIAGNOSIS — I4891 Unspecified atrial fibrillation: Secondary | ICD-10-CM | POA: Insufficient documentation

## 2019-07-06 DIAGNOSIS — Z87891 Personal history of nicotine dependence: Secondary | ICD-10-CM | POA: Insufficient documentation

## 2019-07-06 DIAGNOSIS — F419 Anxiety disorder, unspecified: Secondary | ICD-10-CM | POA: Insufficient documentation

## 2019-07-06 DIAGNOSIS — Z794 Long term (current) use of insulin: Secondary | ICD-10-CM | POA: Insufficient documentation

## 2019-07-06 DIAGNOSIS — E1140 Type 2 diabetes mellitus with diabetic neuropathy, unspecified: Secondary | ICD-10-CM | POA: Insufficient documentation

## 2019-07-06 DIAGNOSIS — R531 Weakness: Secondary | ICD-10-CM

## 2019-07-06 DIAGNOSIS — I10 Essential (primary) hypertension: Secondary | ICD-10-CM | POA: Insufficient documentation

## 2019-07-06 DIAGNOSIS — C259 Malignant neoplasm of pancreas, unspecified: Secondary | ICD-10-CM | POA: Insufficient documentation

## 2019-07-06 DIAGNOSIS — J449 Chronic obstructive pulmonary disease, unspecified: Secondary | ICD-10-CM | POA: Insufficient documentation

## 2019-07-06 DIAGNOSIS — G4730 Sleep apnea, unspecified: Secondary | ICD-10-CM | POA: Insufficient documentation

## 2019-07-06 DIAGNOSIS — Z7982 Long term (current) use of aspirin: Secondary | ICD-10-CM | POA: Insufficient documentation

## 2019-07-06 DIAGNOSIS — Z833 Family history of diabetes mellitus: Secondary | ICD-10-CM | POA: Insufficient documentation

## 2019-07-06 DIAGNOSIS — Z7901 Long term (current) use of anticoagulants: Secondary | ICD-10-CM | POA: Insufficient documentation

## 2019-07-06 DIAGNOSIS — Z79899 Other long term (current) drug therapy: Secondary | ICD-10-CM | POA: Insufficient documentation

## 2019-07-06 DIAGNOSIS — M1990 Unspecified osteoarthritis, unspecified site: Secondary | ICD-10-CM | POA: Insufficient documentation

## 2019-07-06 DIAGNOSIS — E860 Dehydration: Secondary | ICD-10-CM | POA: Insufficient documentation

## 2019-07-06 LAB — CBC WITH DIFF
BASOPHIL #: <0.1 10*3/uL (ref <=0.20)
BASOPHIL %: 1 %
EOSINOPHIL #: 0.12 10*3/uL (ref <=0.50)
EOSINOPHIL %: 4 %
HCT: 29.9 % — ABNORMAL LOW (ref 34.8–46.0)
HGB: 9.7 g/dL — ABNORMAL LOW (ref 11.5–16.0)
IMMATURE GRANULOCYTE #: <0.1 10*3/uL (ref <0.10)
IMMATURE GRANULOCYTE %: 0 % (ref 0–1)
LYMPHOCYTE #: 0.76 10*3/uL — ABNORMAL LOW (ref 1.00–4.80)
LYMPHOCYTE %: 24 %
MCH: 28.4 pg (ref 26.0–32.0)
MCHC: 32.4 g/dL (ref 31.0–35.5)
MCV: 87.7 fL (ref 78.0–100.0)
MONOCYTE #: 0.52 10*3/uL (ref 0.20–1.10)
MONOCYTE %: 16 %
MPV: 12 fL (ref 8.7–12.5)
NEUTROPHIL #: 1.76 10*3/uL (ref 1.50–7.70)
NEUTROPHIL %: 55 %
PLATELETS: 77 10*3/uL — ABNORMAL LOW (ref 150–400)
RBC: 3.41 10*6/uL — ABNORMAL LOW (ref 3.85–5.22)
RDW-CV: 12.7 % (ref 11.5–15.5)
WBC: 3.2 10*3/uL — ABNORMAL LOW (ref 3.7–11.0)

## 2019-07-06 LAB — URINALYSIS, MACROSCOPIC
BILIRUBIN: NEGATIVE mg/dL
BLOOD: NEGATIVE mg/dL
GLUCOSE: NEGATIVE mg/dL
KETONES: NEGATIVE mg/dL
LEUKOCYTES: NEGATIVE WBCs/uL
NITRITE: NEGATIVE
PH: 5.5 (ref 5.0–8.5)
PROTEIN: NEGATIVE mg/dL
SPECIFIC GRAVITY: 1.01 (ref 1.005–1.030)
UROBILINOGEN: 0.2 mg/dL

## 2019-07-06 LAB — COMPREHENSIVE METABOLIC PANEL, NON-FASTING
ALBUMIN: 3 g/dL — ABNORMAL LOW (ref 3.5–5.0)
ALKALINE PHOSPHATASE: 227 U/L — ABNORMAL HIGH (ref 38–126)
ALT (SGPT): 82 U/L — ABNORMAL HIGH (ref 0–34)
ANION GAP: 9 mmol/L (ref 8–16)
AST (SGOT): 39 U/L — ABNORMAL HIGH (ref 14–36)
BILIRUBIN TOTAL: 0.6 mg/dL (ref 0.2–1.3)
BUN/CREA RATIO: 29
BUN: 23 mg/dL — ABNORMAL HIGH (ref 7–17)
CALCIUM: 8 mg/dL — ABNORMAL LOW (ref 8.4–10.2)
CHLORIDE: 101 mmol/L (ref 98–107)
CO2 TOTAL: 23 mmol/L (ref 22–30)
CREATININE: 0.8 mg/dL (ref 0.52–1.04)
ESTIMATED GFR: >60 mL/min/{1.73_m2} (ref >60)
GLUCOSE: 264 mg/dL — ABNORMAL HIGH (ref 74–106)
POTASSIUM: 3.3 mmol/L — ABNORMAL LOW (ref 3.5–5.1)
PROTEIN TOTAL: 5.6 g/dL — ABNORMAL LOW (ref 6.3–8.2)
SODIUM: 133 mmol/L — ABNORMAL LOW (ref 137–145)

## 2019-07-06 LAB — URINALYSIS, MICROSCOPIC

## 2019-07-06 LAB — LIPASE: LIPASE: <10 U/L — ABNORMAL LOW (ref 23–300)

## 2019-07-06 LAB — TROPONIN-I: TROPONIN I: <0.01 ng/mL (ref 0.00–0.03)

## 2019-07-06 LAB — MAGNESIUM: MAGNESIUM: 1.2 mg/dL — ABNORMAL LOW (ref 1.6–2.3)

## 2019-07-06 LAB — ACETONE: ACETONE: NEGATIVE

## 2019-07-06 LAB — LIGHT GREEN TOP TUBE

## 2019-07-06 LAB — BLUE TOP TUBE

## 2019-07-06 LAB — GRAY TOP TUBE

## 2019-07-06 MED ORDER — MAGNESIUM SULFATE 1 GRAM/100 ML IN DEXTROSE 5 % INTRAVENOUS PIGGYBACK
1.0000 g | INJECTION | INTRAVENOUS | Status: AC
Start: 2019-07-06 — End: 2019-07-06
  Administered 2019-07-06: 20:00:00 1 g via INTRAVENOUS
  Administered 2019-07-06: 20:00:00 0 g via INTRAVENOUS
  Administered 2019-07-06: 20:00:00 1 g via INTRAVENOUS
  Administered 2019-07-06: 21:00:00 0 g via INTRAVENOUS
  Filled 2019-07-06 (×2): qty 100

## 2019-07-06 MED ORDER — IOHEXOL 300 MG IODINE/ML INTRAVENOUS SOLUTION
100.00 mL | INTRAVENOUS | Status: AC
Start: 2019-07-06 — End: 2019-07-06
  Administered 2019-07-06: 20:00:00 100 mL via INTRAVENOUS

## 2019-07-06 MED ORDER — MAGNESIUM SULFATE 1 GRAM/100 ML IN DEXTROSE 5 % INTRAVENOUS PIGGYBACK
INJECTION | INTRAVENOUS | Status: DC
Start: 2019-07-06 — End: 2019-07-06
  Filled 2019-07-06: qty 100

## 2019-07-06 MED ORDER — OXYCODONE 5 MG TABLET
20.00 mg | ORAL_TABLET | ORAL | Status: AC
Start: 2019-07-06 — End: 2019-07-06
  Administered 2019-07-06: 20:00:00 20 mg via ORAL
  Filled 2019-07-06: qty 4

## 2019-07-06 MED ORDER — SODIUM CHLORIDE 0.9 % INTRAVENOUS SOLUTION
INTRAVENOUS | Status: DC
Start: 2019-07-06 — End: 2019-07-06
  Administered 2019-07-06: 21:00:00 0 via INTRAVENOUS
  Administered 2019-07-06: 18:00:00 via INTRAVENOUS

## 2019-07-06 NOTE — ED Provider Notes (Signed)
[name]  1946/04/20  73 y.o.  female  [name] [address]   [phone] (home)    Chief Complaint:   Chief Complaint   Patient presents with   . Dizziness   . Headache   . Hyperglycemia       HPI: This is a 73 y.o. female who presents to the emergency department for weakness, dizziness, elevated blood sugar and headache.  Patient has an onset about 3 days, and does have diabetes secondary to having for for procedure for pancreatic cancer.  Patient states glucose has been and 200s and up to over 600 mg/dL in last few days, and with increasing weakness, and headache, patient came to ER for further evaluation.  Symptoms are severe.    No reported fevers, cough, chest pain, smell or taste deficits.  No sick contacts reported.    COVID screen negative.    Past Medical History:   Past Medical History:   Diagnosis Date   . Abdominal hernia 10/18/2017    hx of repair   . Anxiety    . Arthritis    . Asthma    . Atrial fibrillation (CMS HCC)    . Back problem    . Bruises easily    . Cancer (CMS [place]) 10/18/2017    chemo and radiation completed 09/24/2017   . COPD (chronic obstructive pulmonary disease) (CMS HCC)    . CPAP (continuous positive airway pressure) dependence 10/18/2017    has not used recently   . Depression    . DM (diabetes mellitus) (CMS HCC) 2000    Fasting BG 200's. HGA1C 2018 6   . Edema    . Essential hypertension    . GERD (gastroesophageal reflux disease)    . Headache    . Heartburn    . Hx antineoplastic chemo 2018   . Hx of radiation therapy 2018   . Hypercholesteremia    . Hyperlipidemia    . Migraine 10/18/2017    none recently   . Obesity    . Palpitations    . Pancreatic cancer (CMS [facility]) 03/2017   . Panic attack    . PE (pulmonary thromboembolism) (CMS HCC) 03/29/2018   . Peripheral neuropathy 10/18/2017    knees down, bilateral   . Sleep apnea    . Type 2 diabetes mellitus (CMS HCC) 10/18/2017    Dx 2000 FBS 200s   . Unintentional weight loss 10/18/2017    25 lbs 03/2017   . Wears glasses           (Not in an outpatient encounter)     Past Surgical History:   Past Surgical History:   Procedure Laterality Date   . Cesarean section     . Hx cesarean section     . Hx cholecystectomy     . Hx hernia repair     . Hx subclavian port implantion     . Hx subclavian port implantion     . Hx tonsil and adenoidectomy     . Hx tonsillectomy     . Pancreaticoduodenectomy  2018   . Umbilical hernia repair     . Whipple procedure w/ laparoscopy         Social History:   Social History     Tobacco Use   . Smoking status: Former Smoker     Packs/day: 0.50     Years: 20.00     Pack years: 10.00     Last attempt to quit: 10/18/1993  Years since quitting: 25.7   . Smokeless tobacco: Never Used   Substance Use Topics   . Alcohol use: No   . Drug use: No        Family History:  Family History   Problem Relation Age of Onset   . Heart Disease Mother    . Diabetes Mother    . Respiratory Problems Father      Prior to Admission medications    Medication Sig Start Date End Date Taking? Authorizing Provider   apixaban (ELIQUIS) 2.5 mg Oral Tablet Take 1 Tab (2.5 mg total) by mouth Twice daily 04/19/19  Yes Hyacinth Meeker, MD   aspirin (ECOTRIN) 81 mg Oral Tablet, Delayed Release (E.C.) Take 1 Tab (81 mg total) by mouth Once a day 04/20/19  Yes Hyacinth Meeker, MD   atorvastatin (LIPITOR) 10 mg Oral Tablet Take 1 Tab (10 mg total) by mouth Every evening 04/19/19  Yes Hyacinth Meeker, MD   cetirizine (ZYRTEC) 10 mg Oral Tablet Take 1 Tab (10 mg total) by mouth Once a day 04/20/19  Yes Hyacinth Meeker, MD   furosemide (LASIX) 20 mg Oral Tablet Take 2 Tabs (40 mg total) by mouth Once a day 09/25/18  Yes Jonah Blue, FNP-BC   hydrALAZINE (APRESOLINE) 10 mg Oral Tablet Take 1 Tab (10 mg total) by mouth Every 8 hours 04/19/19  Yes Hyacinth Meeker, MD   HYDROcodone-acetaminophen [facility]) 5-325 mg Oral Tablet Take 1 Tab by mouth Every 4 hours as needed 04/19/19  Yes Hyacinth Meeker, MD   insulin  aspart U-100 (NOVOLOG FLEXPEN U-100 INSULIN) 100 unit/mL (3 mL) Subcutaneous Insulin Pen 5 Units by Subcutaneous route Twice a day before meals 10/29/18  Yes Ignacia Felling, MD   insulin glargine (LANTUS SOLOSTAR U-100 INSULIN) 100 unit/mL Subcutaneous Insulin Pen 8 Units by Subcutaneous route Every night 04/19/19  Yes Hyacinth Meeker, MD   losartan (COZAAR) 100 mg Oral Tablet Take 100 mg by mouth Once a day  01/28/17  Yes Provider, Historical   metoprolol succinate (TOPROL-XL) 25 mg Oral Tablet Sustained Release 24 hr Take 1 Tab (25 mg total) by mouth Once a day 04/20/19  Yes Hyacinth Meeker, MD   montelukast (SINGULAIR) 10 mg Oral Tablet Take 1 Tab (10 mg total) by mouth Every evening 04/19/19  Yes Hyacinth Meeker, MD   omeprazole (PRILOSEC) 40 mg Oral Capsule, Delayed Release(E.C.) Take 1 Cap (40 mg total) by mouth Once a day 02/07/18  Yes Nicola Police, MD   pancreatic enzyme replacement (CREON) 12,000-38,000 -60,000 unit Oral Capsule, Delayed Release(E.C.) Take by mouth Three times daily with meals 2 CAPS WITH MEALS AND 1 CAP WITH EACH SNACK   Yes Provider, Historical   cefuroxime (CEFTIN) 500 mg Oral Tablet Take 1 Tab (500 mg total) by mouth Twice daily 04/19/19 07/06/19  Hyacinth Meeker, MD   hydrocortisone-pramoxine (PROCTOFOAM [facility]) 1-1 % Rectal Foam 1 Applicator by Rectal route Every 6 hours  Patient taking differently: 1 Applicator by Rectal route Twice per day as needed  09/25/18 07/06/19  Jonah Blue, FNP-BC   LORazepam (ATIVAN) 0.5 mg Oral Tablet 0.5 mg Once a day  10/06/17 07/06/19  Provider, Historical   ondansetron (ZOFRAN) 4 mg Oral Tablet Take 4 mg by mouth Every 6 hours as needed for nausea/vomiting  07/06/19  Provider, Historical   sodium chloride 1 gram Oral Tablet Take 1 Tab (1 g total) by mouth Three times daily with meals 10/30/18 07/06/19  Ignacia Felling, MD          Allergies:   Allergies   Allergen Reactions   . Sulfa (Sulfonamides) Itching       Above  history reviewed with patient.  Allergies, medication list, reviewed.       Review of systems  Constitutional: Negative for fever. Negative for chills.  Positive for weakness and dizziness.  Skin: Negative for rash or other lesions.   HENT:  Reports headaches, vague aching quality.    Eyes: Negative for blurred vision and double vision.   Cardiovascular: Negative for chest pain and palpitations.   Respiratory: Negative for cough. Is not experiencing shortness of breath. No wheezing.  Gastrointestinal: Negative for nausea, vomiting and abdominal pain. No diarrhea, constipation or hematochezia  Genitourinary:  Does state dysuria, step pyuria or hematuria.  Denies polyuria or polydipsia  Endocrine:  Elevate blood sugar up to 600 in evenings.  Musculoskeletal: Negative for neck pain and back pain.   Neurological:  Reports dizziness without focal deficits.  Global weakness reported.  No seizures reported.  No numbness, tingling, or other sensation changed.  Psych:  Denies anxiety or depression.  All other systems reviewed and are negative.        Physical Exam  ED Triage Vitals   BP    Pulse    Resp    Temp    SpO2    Weight    Height      Constitutional: patient is oriented to person, place, and time and well-developed, well-nourished, and in no distress.  Patient is frail, however nontoxic in appearance.  She is somewhat ill-appearing.  Vital signs noted as above.  Nursing notes reviewed.  HENT:   Head: Normocephalic and atraumatic.    Mouth/Throat: Oropharynx is clear and membranes slightly dry.  Eyes: Pupils are equal, round, and reactive to light. Conjunctivae and EOM are normal.  No icterus  Neck: Normal range of motion. Neck supple.   Cardiovascular: Normal rate, regular rhythm, normal heart sounds and intact distal pulses.  No murmurs.  Pulmonary/Chest: Effort normal and breath sounds normal.  No rales, rhonchi, or wheezes.  Abdominal: Soft. Bowel sounds are normal.  No masses, or peritoneal signs.  No  tenderness.  Musculoskeletal: Normal range of motion.  No edema.  Neurological: Patient is alert and oriented to person, place, and time.  GCS . score is 15.   Skin: Skin is warm and dry.  Slightly delayed skin turgor.  Psychiatric: Mood, memory, affect and judgment normal.    The following orders were placed after examining the patient :  Orders Placed This Encounter   . XR AP MOBILE CHEST   . CT ABDOMEN PELVIS W IV CONTRAST   . CBC/DIFF   . COMPREHENSIVE METABOLIC PANEL, NON-FASTING   . LIPASE   . MAGNESIUM   . URINALYSIS, MACROSCOPIC AND MICROSCOPIC   . TROPONIN-I   . ACETONE   . CBC WITH DIFF   . CANCELED: URINALYSIS, MACROSCOPIC   . URINALYSIS, MICROSCOPIC   . URINALYSIS, MACROSCOPIC   . EXTRA TUBES   . BLUE TOP TUBE   . LIGHT GREEN TOP TUBE   . GRAY TOP TUBE   . ECG 12-LEAD   . PERFORM POC WHOLE BLOOD GLUCOSE   . INSERT & MAINTAIN PERIPHERAL IV ACCESS   . NS premix infusion   . magnesium sulfate 1 G in D5W 100 mL premix IVPB      XR AP MOBILE CHEST    (Results Pending)  CT ABDOMEN PELVIS W IV CONTRAST    (Results Pending)      Labs Reviewed   COMPREHENSIVE METABOLIC PANEL, NON-FASTING - Abnormal; Notable for the following components:       Result Value    SODIUM 133 (*)     POTASSIUM 3.3 (*)     BUN 23 (*)     ALBUMIN 3.0 (*)     CALCIUM 8.0 (*)     GLUCOSE 264 (*)     ALKALINE PHOSPHATASE 227 (*)     ALT (SGPT) 82 (*)     AST (SGOT) 39 (*)     PROTEIN TOTAL 5.6 (*)     All other components within normal limits    Narrative:     Estimated Glomerular Filtration Rate (eGFR) calculated using the CKD-EPI (2009) equation, intended for patients 56 years of age and older. If race and/or gender is not documented or "unknown," there will be no eGFR calculation.   LIPASE - Abnormal; Notable for the following components:    LIPASE <10 (*)     All other components within normal limits   MAGNESIUM - Abnormal; Notable for the following components:    MAGNESIUM 1.2 (*)     All other components within normal limits   CBC  WITH DIFF - Abnormal; Notable for the following components:    WBC 3.2 (*)     RBC 3.41 (*)     HGB 9.7 (*)     HCT 29.9 (*)     PLATELETS 77 (*)     LYMPHOCYTE # 0.76 (*)     All other components within normal limits   TROPONIN-I - Normal   ACETONE - Normal   URINALYSIS, MICROSCOPIC - Normal   CBC/DIFF    Narrative:     The following orders were created for panel order CBC/DIFF.  Procedure                               Abnormality         Status                     ---------                               -----------         ------                     CBC WITH DIFF[[id number]]                Abnormal            Final result                 Please view results for these tests on the individual orders.   URINALYSIS, MACROSCOPIC AND MICROSCOPIC    Narrative:     The following orders were created for panel order URINALYSIS, MACROSCOPIC AND MICROSCOPIC.  Procedure                               Abnormality         Status                     ---------                               -----------         ------  URINALYSIS, MACROSCOPIC[[id number]]                                                     URINALYSIS, MICROSCOPIC[[id number]]      Normal              Final result                 Please view results for these tests on the individual orders.   URINALYSIS, MACROSCOPIC   EXTRA TUBES    Narrative:     The following orders were created for panel order EXTRA TUBES.  Procedure                               Abnormality         Status                     ---------                               -----------         ------                     BLUE TOP TUBE[[id number]]                                    In process                 LIGHT GREEN TOP TUBE[[id number]]                             In process                 GRAY TOP TUBE[[id number]]                                    In process                   Please view results for these tests on the individual orders.   BLUE TOP TUBE   LIGHT GREEN TOP TUBE   GRAY TOP TUBE     PERFORM POC WHOLE BLOOD GLUCOSE          ED Course:     Patient frail, somewhat ill-appearing, however no significant findings on physical examination otherwise.  Laboratory data returned,     CT scan and chest radiograph for pending.      Patient is noted to have a significant hypo magnesemia.  Magnesium level is 1.2, and replacement has been ordered.    CBC does reveal white count 3.2 with hemoglobin 9.7 hematocrit 29.9 platelet count 77000. Platelets were over 200,002 months ago.  There is concern again for malignancy or returns of malignancy.    Transaminases, alkaline phosphatase are elevate it as well.  Bilirubin normal, electrolyte panel is fairly unremarkable otherwise.  BUN is 23, and IV fluids have been ordered.    Comments added by Tawanna Sat, DO on 07/06/19 at 19:34.  Medications   NS premix infusion ( Intravenous New Bag/New Syringe 07/06/19 1825)   magnesium sulfate 1 G in D5W 100 mL premix IVPB (has no administration in time range)      Procedures      Emergency Department Procedure:    EKG Interpertation:         EKG sinus bradycardia 56 beats per minute with left axis deviation at -13 degrees.    Will is the outer limits of normal 0.206 seconds.  No acute ST elevations or reciprocal depressions noted.    EKG is slightly changed from previous in November 2019.  There is note of an EKG on 04/13/2019, however this is unable to be obtained from the electronic medical record.     MDM:  Differential diagnosis includes pancreatitis, malignancy, urinary tract infection, dehydration, electrolyte abnormality, infectious process.    Findings and diagnosis discussed with patient.    Clinical Impression:   Encounter Diagnoses   Name Primary?   . Hyperglycemia Yes   . Hypomagnesemia    . Dehydration    . Headache    . General weakness        Disposition:  Patient discussed with Dr. Lynnette Caffey, including labs, findings, pending studies.  Please refer to his notes for further diagnosis and disposition.    No  follow-ups on file.   New Prescriptions    No medications on file      No follow-up provider specified.   BP (!) 146/55   Pulse 67   Temp 37.4 C (99.3 F)   Resp 16   Ht 1.575 m ('5\' 2"' )   Wt 65.8 kg (145 lb)   SpO2 100%   BMI 26.52 kg/m        Tawanna Sat, DO       This note was partially created using voice recognition software and is inherently subject to errors including those of syntax and "sound alike " substitutions which may escape proof reading.  In such instances, original meaning may be extrapolated by contextual derivation.

## 2019-07-06 NOTE — Discharge Instructions (Addendum)
Continue home medicines.  New medicines as directed.  Follow up with Dr. Karmen Bongo this coming week.  Return to ER with any symptoms of concern.

## 2019-07-06 NOTE — ED Nurses Note (Signed)
Pt marked ready for CT and RAD

## 2019-07-06 NOTE — ED Nurses Note (Signed)
Pt received oxycodone to go in sealed envelope. Pt received DC instructions and verbalized understanding. Pt has to finish Mag and she can be discharged.

## 2019-07-06 NOTE — ED Nurses Note (Signed)
Pt taken to vehicle in [place].

## 2019-07-06 NOTE — ED Triage Notes (Signed)
Weakness, dizziness, elevated blood sugar, and headache x 3 days.

## 2019-07-06 NOTE — ED Provider Notes (Signed)
Emergency Department  [facility]   07/06/2019     [name]  [birth year]  73 y.o.  female  [name] [id number]   [phone] (home)  PCP: Myrene Buddy, MD   Date of service:07/06/2019 20:05    Chief Complaint:   Chief Complaint   Patient presents with   . Dizziness   . Headache   . Hyperglycemia           HPI: This is a 73 y.o. female who presents to the emergency department for weakness dizziness and elevated blood sugar and headache.  Patient reports has been going on for 3 days.  Patient reports her glucose has been running high.  Patient reports her symptoms are severe.  Care assumed from Dr. Riki Sheer at 7:00 p.m. Due to shift change.  Please see his note for further.  Patient reports she has some abdominal pain.  Patient reports this has been going on since she had surgery for her pancreatic cancer.  Patient denies any change in her pain.  Denies any nausea or vomiting.  Reports good appetite.  Normal bowel movements.  Patient notes her headache is improved and declines any further pain medicines at this time.              [place] Pain Rating Scale     On a scale of 0-10, during the past 24 hours, pain has interfered with you usual activity:       On a scale of 0-10, during the past 24 hours, pain has interfered with your sleep:      On a scale of 0-10, during the past 24 hours, pain has affected your mood:       On a scale of 0-10, during the past 24 hours, pain has contributed to your stress:       On a scale of 0-10, what is your overall pain Rating: 4        Past Medical History:   Past Medical History:   Diagnosis Date   . Abdominal hernia 10/18/2017    hx of repair   . Anxiety    . Arthritis    . Asthma    . Atrial fibrillation (CMS HCC)    . Back problem    . Bruises easily    . Cancer (CMS [place]) 10/18/2017    chemo and radiation completed 09/24/2017   . COPD (chronic obstructive pulmonary disease) (CMS HCC)    . CPAP (continuous positive airway pressure) dependence 10/18/2017     has not used recently   . Depression    . DM (diabetes mellitus) (CMS HCC) 2000    Fasting BG 200's. HGA1C 2018 6   . Edema    . Essential hypertension    . GERD (gastroesophageal reflux disease)    . Headache    . Heartburn    . Hx antineoplastic chemo 2018   . Hx of radiation therapy 2018   . Hypercholesteremia    . Hyperlipidemia    . Migraine 10/18/2017    none recently   . Obesity    . Palpitations    . Pancreatic cancer (CMS [place]) 03/2017   . Panic attack    . PE (pulmonary thromboembolism) (CMS HCC) 03/29/2018   . Peripheral neuropathy 10/18/2017    knees down, bilateral   . Sleep apnea    . Type 2 diabetes mellitus (CMS HCC) 10/18/2017    Dx 2000 FBS 200s   . Unintentional weight loss  10/18/2017    25 lbs 03/2017   . Wears glasses      (Not in an outpatient encounter)     Past Surgical History:   Past Surgical History:   Procedure Laterality Date   . Cesarean section     . Hx cesarean section     . Hx cholecystectomy     . Hx hernia repair     . Hx subclavian port implantion     . Hx subclavian port implantion     . Hx tonsil and adenoidectomy     . Hx tonsillectomy     . Pancreaticoduodenectomy  2018   . Umbilical hernia repair     . Whipple procedure w/ laparoscopy         Social History:   Social History     Tobacco Use   . Smoking status: Former Smoker     Packs/day: 0.50     Years: 20.00     Pack years: 10.00     Last attempt to quit: 10/18/1993     Years since quitting: 25.7   . Smokeless tobacco: Never Used   Substance Use Topics   . Alcohol use: No   . Drug use: No        Family History:  Family History   Problem Relation Age of Onset   . Heart Disease Mother    . Diabetes Mother    . Respiratory Problems Father      Prior to Admission medications    Medication Sig Start Date End Date Taking? Authorizing Provider   apixaban (ELIQUIS) 2.5 mg Oral Tablet Take 1 Tab (2.5 mg total) by mouth Twice daily 04/19/19  Yes Hyacinth Meeker, MD   aspirin (ECOTRIN) 81 mg Oral Tablet, Delayed Release (E.C.) Take  1 Tab (81 mg total) by mouth Once a day 04/20/19  Yes Hyacinth Meeker, MD   atorvastatin (LIPITOR) 10 mg Oral Tablet Take 1 Tab (10 mg total) by mouth Every evening 04/19/19  Yes Hyacinth Meeker, MD   cetirizine (ZYRTEC) 10 mg Oral Tablet Take 1 Tab (10 mg total) by mouth Once a day 04/20/19  Yes Hyacinth Meeker, MD   furosemide (LASIX) 20 mg Oral Tablet Take 2 Tabs (40 mg total) by mouth Once a day 09/25/18  Yes Jonah Blue, FNP-BC   hydrALAZINE (APRESOLINE) 10 mg Oral Tablet Take 1 Tab (10 mg total) by mouth Every 8 hours 04/19/19  Yes Hyacinth Meeker, MD   HYDROcodone-acetaminophen [facility]) 5-325 mg Oral Tablet Take 1 Tab by mouth Every 4 hours as needed 04/19/19  Yes Hyacinth Meeker, MD   insulin aspart U-100 (NOVOLOG FLEXPEN U-100 INSULIN) 100 unit/mL (3 mL) Subcutaneous Insulin Pen 5 Units by Subcutaneous route Twice a day before meals 10/29/18  Yes Ignacia Felling, MD   insulin glargine (LANTUS SOLOSTAR U-100 INSULIN) 100 unit/mL Subcutaneous Insulin Pen 8 Units by Subcutaneous route Every night 04/19/19  Yes Hyacinth Meeker, MD   losartan (COZAAR) 100 mg Oral Tablet Take 100 mg by mouth Once a day  01/28/17  Yes Provider, Historical   metoprolol succinate (TOPROL-XL) 25 mg Oral Tablet Sustained Release 24 hr Take 1 Tab (25 mg total) by mouth Once a day 04/20/19  Yes Hyacinth Meeker, MD   montelukast (SINGULAIR) 10 mg Oral Tablet Take 1 Tab (10 mg total) by mouth Every evening 04/19/19  Yes Hyacinth Meeker, MD   omeprazole (PRILOSEC) 40 mg Oral Capsule, Delayed Release(E.C.) Take  1 Cap (40 mg total) by mouth Once a day 02/07/18  Yes Nicola Police, MD   pancreatic enzyme replacement (CREON) 12,000-38,000 -60,000 unit Oral Capsule, Delayed Release(E.C.) Take by mouth Three times daily with meals 2 CAPS WITH MEALS AND 1 CAP WITH EACH SNACK   Yes Provider, Historical   cefuroxime (CEFTIN) 500 mg Oral Tablet Take 1 Tab (500 mg total) by mouth Twice daily  04/19/19 07/06/19  Hyacinth Meeker, MD   hydrocortisone-pramoxine (PROCTOFOAM [facility]) 1-1 % Rectal Foam 1 Applicator by Rectal route Every 6 hours  Patient taking differently: 1 Applicator by Rectal route Twice per day as needed  09/25/18 07/06/19  Jonah Blue, FNP-BC   LORazepam (ATIVAN) 0.5 mg Oral Tablet 0.5 mg Once a day  10/06/17 07/06/19  Provider, Historical   ondansetron (ZOFRAN) 4 mg Oral Tablet Take 4 mg by mouth Every 6 hours as needed for nausea/vomiting  07/06/19  Provider, Historical   sodium chloride 1 gram Oral Tablet Take 1 Tab (1 g total) by mouth Three times daily with meals 10/30/18 07/06/19  Ignacia Felling, MD          Allergies:   Allergies   Allergen Reactions   . Sulfa (Sulfonamides) Itching       Above history reviewed with patient.  Allergies, medication list, reviewed.       Review of Systems -   Constitutional: Negative for fever. Negative for chills.  Positive for weakness and dizziness.  Skin: Negative for rash or other lesions.   HENT: Negative for headaches.   Eyes: Negative for blurred vision and double vision.   Cardiovascular: Negative for chest pain and palpitations.   Respiratory: Negative for cough. Is not experiencing shortness of breath. No wheezing.  Gastrointestinal: Negative for nausea, vomiting.  Positive for chronic abdominal pain that is unchanged.. No diarrhea, constipation or hematochezia  Genitourinary:  Positive for dysuria positive for urinary frequency.. No LMP recorded. Patient is postmenopausal.  Musculoskeletal: Negative for neck pain and back pain.   Neurological: Negative for dizziness and loss of consciousness.  No numbness, tingling, or other sensation changed.  Psych:  Denies anxiety or depression.  All other systems reviewed and are negative.        Physical Exam  Body mass index is 26.52 kg/m.  ED Triage Vitals [07/06/19 1804]   BP (Non-Invasive) (!) 146/55   Heart Rate 67   Respiratory Rate 16   Temperature 37.4 C (99.3 F)   SpO2 100 %   Weight  65.8 kg (145 lb)   Height 1.575 m (_0 )     Constitutional: patient is oriented to person, place, and time and well-developed, well-nourished, and in no distress.   HENT:   Head: Normocephalic and atraumatic.   Right Ear: External ear normal.  TM normal  Left Ear: External ear normal.  TM normal  Nose: Nose normal.   Mouth/Throat: Oropharynx is clear and moist mucous membranes time of my exam.  Eyes: Pupils are equal, round, and reactive to light. Conjunctivae and EOM are normal.   Neck: Normal range of motion. Neck supple.   Cardiovascular: Normal rate, regular rhythm, normal heart sounds and intact distal pulses.   Pulmonary/Chest: Effort normal and breath sounds normal.   Abdominal: Soft. Bowel sounds are normal.  No guarding or rebound.  Hernia noted in left lower quadrant.  Easily reducible.  Abdomen is soft and nontender.  Musculoskeletal: Normal range of motion.   Neurological:Patient is alert and oriented to person, place,  and time.   GCS score is 15.   Skin: Skin is warm and dry.  Pallor noted.  Decreased skin turgor.  Psychiatric: Mood, memory, affect and judgment normal.    The following orders were placed after examining the patient :  Orders Placed This Encounter   . XR AP MOBILE CHEST   . CT ABDOMEN PELVIS W IV CONTRAST   . CBC/DIFF   . COMPREHENSIVE METABOLIC PANEL, NON-FASTING   . LIPASE   . MAGNESIUM   . URINALYSIS, MACROSCOPIC AND MICROSCOPIC   . TROPONIN-I   . ACETONE   . CBC WITH DIFF   . CANCELED: URINALYSIS, MACROSCOPIC   . URINALYSIS, MICROSCOPIC   . CANCELED: URINALYSIS, MACROSCOPIC   . EXTRA TUBES   . BLUE TOP TUBE   . LIGHT GREEN TOP TUBE   . GRAY TOP TUBE   . URINALYSIS, MACROSCOPIC AND MICROSCOPIC   . URINALYSIS, MACROSCOPIC   . CANCELED: URINALYSIS, MICROSCOPIC   . ECG 12-LEAD   . CANCELED: PERFORM POC WHOLE BLOOD GLUCOSE   . CANCELED: INSERT & MAINTAIN PERIPHERAL IV ACCESS   . magnesium sulfate 1 G in D5W 100 mL premix IVPB   . iohexol (OMNIPAQUE 300) infusion   . oxyCODONE  (ROXICODONE) immediate release tablet      XR AP MOBILE CHEST   ED Interpretation   No acute chest pathology      CT ABDOMEN PELVIS W IV CONTRAST   ED Interpretation   COMPARISON:  No relevant prior studies available.  FINDINGS:  Liver: Normal. No mass.  Gallbladder and bile ducts: There has been a cholecystectomy. There is pneumobilia.  Pancreas: Status post partial pancreatectomy.  Spleen: Normal. No splenomegaly.  Adrenals: Normal. No mass.  Kidneys and ureters: Normal. No hydronephrosis.  Stomach and bowel: Status post duodenal diversion. There is protrusion of nonobstructed segment  of bowel probably large bowel through the ventral abdomen wall without signs of obstruction of the  colon. There is left abdomen ventral hernia with opening measuring 5.6 cm contains mostly  nonobstructed collapsed bowel loops however 1 of the bowel loops is distended mostly with gas with  a diameter of 3 cm. There are a few diverticula of the colon. The stomach is not distended.  Appendix: No evidence of appendicitis.  Intraperitoneal space: Unremarkable. No free air. No significant fluid collection.  Vasculature: Unremarkable. No abdominal aortic aneurysm.  Lymph    nodes: Unremarkable. No enlarged lymph nodes.  Bladder: Unremarkable as visualized.  Reproductive: Unremarkable as visualized.  Bones/joints: There is mild grade 1 anterolisthesis L4. There is severe disc space narrowing L5/S1.  Soft tissues: There is diastasis of the ventral abdominal wall musculature.  IMPRESSION:  1. Left abdomen ventral hernia contains a prominent small bowel loop and is suspicious of a partial or  incomplete obstruction. Recommend follow-up with oral contrast.  2. A few diverticula of the colon.         Labs Reviewed   COMPREHENSIVE METABOLIC PANEL, NON-FASTING - Abnormal; Notable for the following components:       Result Value    SODIUM 133 (*)     POTASSIUM 3.3 (*)     BUN 23 (*)     ALBUMIN 3.0 (*)     CALCIUM 8.0 (*)     GLUCOSE 264 (*)      ALKALINE PHOSPHATASE 227 (*)     ALT (SGPT) 82 (*)     AST (SGOT) 39 (*)     PROTEIN TOTAL 5.6 (*)  All other components within normal limits    Narrative:     Estimated Glomerular Filtration Rate (eGFR) calculated using the CKD-EPI (2009) equation, intended for patients 72 years of age and older. If race and/or gender is not documented or "unknown," there will be no eGFR calculation.   LIPASE - Abnormal; Notable for the following components:    LIPASE <10 (*)     All other components within normal limits   MAGNESIUM - Abnormal; Notable for the following components:    MAGNESIUM 1.2 (*)     All other components within normal limits   CBC WITH DIFF - Abnormal; Notable for the following components:    WBC 3.2 (*)     RBC 3.41 (*)     HGB 9.7 (*)     HCT 29.9 (*)     PLATELETS 77 (*)     LYMPHOCYTE # 0.76 (*)     All other components within normal limits   TROPONIN-I - Normal   ACETONE - Normal   URINALYSIS, MICROSCOPIC - Normal   URINALYSIS, MACROSCOPIC - Normal   CBC/DIFF    Narrative:     The following orders were created for panel order CBC/DIFF.  Procedure                               Abnormality         Status                     ---------                               -----------         ------                     CBC WITH DIFF[[id number]]                Abnormal            Final result                 Please view results for these tests on the individual orders.   URINALYSIS, MACROSCOPIC AND MICROSCOPIC    Narrative:     The following orders were created for panel order URINALYSIS, MACROSCOPIC AND MICROSCOPIC.  Procedure                               Abnormality         Status                     ---------                               -----------         ------                     URINALYSIS, MACROSCOPIC[[id number]]                                                     URINALYSIS, MICROSCOPIC[[id number]]      Normal  Final result                 Please view results for these tests on the individual  orders.   BLUE TOP TUBE   GRAY TOP TUBE   URINALYSIS, MACROSCOPIC AND MICROSCOPIC    Narrative:     The following orders were created for panel order URINALYSIS, MACROSCOPIC AND MICROSCOPIC.  Procedure                               Abnormality         Status                     ---------                               -----------         ------                     URINALYSIS, MACROSCOPIC[[id number]]      Normal              Final result               URINALYSIS, MICROSCOPIC[[id number]]                                                       Please view results for these tests on the individual orders.      Results for orders placed or performed during the hospital encounter of 07/06/19 (from the past 12 hour(s))   COMPREHENSIVE METABOLIC PANEL, NON-FASTING   Result Value Ref Range    SODIUM 133 (L) 137 - 145 mmol/L    POTASSIUM 3.3 (L) 3.5 - 5.1 mmol/L    CHLORIDE 101 98 - 107 mmol/L    CO2 TOTAL 23 22 - 30 mmol/L    ANION GAP 9 8 - 16 mmol/L    BUN 23 (H) 7 - 17 mg/dL    CREATININE 0.80 0.52 - 1.04 mg/dL    BUN/CREA RATIO 29     ESTIMATED GFR >60 >60 mL/min/1.68m2    ALBUMIN 3.0 (L) 3.5 - 5.0 g/dL    CALCIUM 8.0 (L) 8.4 - 10.2 mg/dL    GLUCOSE 264 (H) 74 - 106 mg/dL    ALKALINE PHOSPHATASE 227 (H) 38 - 126 U/L    ALT (SGPT) 82 (H) 0 - 34 U/L    AST (SGOT) 39 (H) 14 - 36 U/L    BILIRUBIN TOTAL 0.6 0.2 - 1.3 mg/dL    PROTEIN TOTAL 5.6 (L) 6.3 - 8.2 g/dL   LIPASE   Result Value Ref Range    LIPASE <10 (L) 23 - 300 U/L   MAGNESIUM   Result Value Ref Range    MAGNESIUM 1.2 (L) 1.6 - 2.3 mg/dL   TROPONIN-I   Result Value Ref Range    TROPONIN I <0.01 0.00 - 0.03 ng/mL   ACETONE   Result Value Ref Range    ACETONE SERUM Negative Negative   CBC WITH DIFF   Result Value Ref Range    WBC 3.2 (L) 3.7 - 11.0 x10^3/uL    RBC 3.41 (L) 3.85 - 5.22 x10^6/uL    HGB  9.7 (L) 11.5 - 16.0 g/dL    HCT 29.9 (L) 34.8 - 46.0 %    MCV 87.7 78.0 - 100.0 fL    MCH 28.4 26.0 - 32.0 pg    MCHC 32.4 31.0 - 35.5 g/dL    RDW-CV 12.7 11.5 - 15.5 %     PLATELETS 77 (L) 150 - 400 x10^3/uL    MPV 12.0 8.7 - 12.5 fL    NEUTROPHIL % 55 %    LYMPHOCYTE % 24 %    MONOCYTE % 16 %    EOSINOPHIL % 4 %    BASOPHIL % 1 %    NEUTROPHIL # 1.76 1.50 - 7.70 x10^3/uL    LYMPHOCYTE # 0.76 (L) 1.00 - 4.80 x10^3/uL    MONOCYTE # 0.52 0.20 - 1.10 x10^3/uL    EOSINOPHIL # 0.12 <=0.50 x10^3/uL    BASOPHIL # <0.10 <=0.20 x10^3/uL    IMMATURE GRANULOCYTE % 0 0 - 1 %    IMMATURE GRANULOCYTE # <0.10 <0.10 x10^3/uL   URINALYSIS, MICROSCOPIC   Result Value Ref Range    RBCS 0-2 None, Occasional, 0-2, 3-5 /hpf    WBCS 0-2 None, Occasional, 0-2, 3-5 /hpf    BACTERIA None None /hpf    SQUAMOUS EPITHELIAL 0-2 None, Occasional, 0-2, 3-5 /hpf   BLUE TOP TUBE   Result Value Ref Range    RAINBOW/EXTRA TUBE AUTO RESULT Yes    LIGHT GREEN TOP TUBE   Result Value Ref Range    RAINBOW/EXTRA TUBE AUTO RESULT Yes    GRAY TOP TUBE   Result Value Ref Range    RAINBOW/EXTRA TUBE AUTO RESULT Yes    URINALYSIS, MACROSCOPIC   Result Value Ref Range    COLOR Yellow Yellow, Straw    APPEARANCE Clear Clear, Slightly Hazy    SPECIFIC GRAVITY 1.010 1.005 - 1.030    PH 5.5 5.0 - 8.5    LEUKOCYTES Negative Negative WBCs/uL    NITRITE Negative Negative    PROTEIN Negative Negative mg/dL    GLUCOSE Negative Negative mg/dL    KETONES Negative Negative mg/dL    UROBILINOGEN 0.2  0.2 mg/dL    BILIRUBIN Negative Negative mg/dL    BLOOD Negative Negative mg/dL         ED Course:       Medications   magnesium sulfate 1 G in D5W 100 mL premix IVPB (0 g Intravenous Stopped 07/06/19 2116)   iohexol (OMNIPAQUE 300) infusion (100 mL Intravenous Given 07/06/19 1942)   oxyCODONE (ROXICODONE) immediate release tablet (20 mg Oral Given 07/06/19 2029)        Emergency Department Procedure:    EKG Interpertation:  Sinus bradycardia with a rate of 56.  No PVCs.  Normal p.r. Interval.  Normal QRS.  Impression sinus bradycardia otherwise normal EKG.  Procedures     MDM:  CT scan findings suggestive of a partial small-bowel obstruction.   Patient has no signs of bowel obstruction and hernia that was causing obstruction was easily reducible on exam.  Patient does have low magnesium and this is being replaced.  Patient requested distal analgesics for her pain.  Patient is on chronic hydrocodone home will send her home with a few Oxy IR and have her follow-up with her PCP this coming week.      Findings and diagnosis discussed with patient.  Clinical Impression:   Encounter Diagnoses   Name Primary?   . Hyperglycemia Yes   . Hypomagnesemia    . Dehydration    .  Headache    . General weakness            Disposition: Discharged          No follow-ups on file.   Discharge Medication List as of 07/06/2019  8:16 PM         Myrene Buddy, MD  [address]  [place] [address]  [phone]    In 3 days       BP (!) 147/81   Pulse 60   Temp 37.4 C (99.3 F)   Resp 13   Ht 1.575 m (_0 )   Wt 65.8 kg (145 lb)   SpO2 96%   BMI 26.52 kg/m        Sharma Covert, MD7/25/2020              This note was partially created using voice recognition software and is inherently subject to errors including those of syntax and "sound alike " substitutions which may escape proof reading.  In such instances, original meaning may be extrapolated by contextual derivation.

## 2019-07-07 NOTE — Progress Notes (Signed)
Please review. Are any further interventions required?

## 2019-07-08 LAB — ECG 12-LEAD
Atrial Rate: 56 {beats}/min
Calculated P Axis: 48 degrees
Calculated R Axis: -13 degrees
Calculated T Axis: 46 degrees
PR Interval: 206 ms
QRS Duration: 94 ms
QT Interval: 420 ms
QTC Calculation: 405 ms
Ventricular rate: 56 {beats}/min

## 2019-07-11 ENCOUNTER — Ambulatory Visit
Admission: RE | Admit: 2019-07-11 | Discharge: 2019-07-11 | Disposition: A | Discharge status: Home / Self Care | Payer: Medicare Other | Source: Ambulatory Visit | Attending: Family Medicine | Admitting: Family Medicine

## 2019-07-11 ENCOUNTER — Other Ambulatory Visit: Payer: Self-pay

## 2019-07-11 ENCOUNTER — Ambulatory Visit (HOSPITAL_COMMUNITY): Payer: Medicare Other

## 2019-07-11 DIAGNOSIS — E119 Type 2 diabetes mellitus without complications: Secondary | ICD-10-CM

## 2019-07-11 DIAGNOSIS — C259 Malignant neoplasm of pancreas, unspecified: Secondary | ICD-10-CM

## 2019-07-11 DIAGNOSIS — I10 Essential (primary) hypertension: Secondary | ICD-10-CM

## 2019-07-11 LAB — MAGNESIUM: MAGNESIUM: 1.4 mg/dL — ABNORMAL LOW (ref 1.6–2.3)

## 2019-07-11 LAB — COMPREHENSIVE METABOLIC PANEL, NON-FASTING
ALBUMIN: 3.5 g/dL (ref 3.5–5.0)
ALKALINE PHOSPHATASE: 188 U/L — ABNORMAL HIGH (ref 38–126)
ALT (SGPT): 34 U/L (ref 0–34)
ANION GAP: 8 mmol/L (ref 8–16)
AST (SGOT): 25 U/L (ref 14–36)
BILIRUBIN TOTAL: 0.5 mg/dL (ref 0.2–1.3)
BUN/CREA RATIO: 18
BUN: 18 mg/dL — ABNORMAL HIGH (ref 7–17)
CALCIUM: 9 mg/dL (ref 8.4–10.2)
CHLORIDE: 100 mmol/L (ref 98–107)
CO2 TOTAL: 26 mmol/L (ref 22–30)
CREATININE: 1 mg/dL (ref 0.52–1.04)
ESTIMATED GFR: 57 mL/min/{1.73_m2} — ABNORMAL LOW (ref >60)
GLUCOSE: 190 mg/dL — ABNORMAL HIGH (ref 74–106)
POTASSIUM: 4.4 mmol/L (ref 3.5–5.1)
PROTEIN TOTAL: 5.8 g/dL — ABNORMAL LOW (ref 6.3–8.2)
SODIUM: 134 mmol/L — ABNORMAL LOW (ref 137–145)

## 2019-07-11 LAB — CBC WITH DIFF
BASOPHIL #: <0.1 10*3/uL (ref <=0.20)
BASOPHIL %: 1 %
EOSINOPHIL #: 0.14 10*3/uL (ref <=0.50)
EOSINOPHIL %: 3 %
HCT: 35.3 % (ref 34.8–46.0)
HGB: 11.3 g/dL — ABNORMAL LOW (ref 11.5–16.0)
IMMATURE GRANULOCYTE #: <0.1 10*3/uL (ref <0.10)
IMMATURE GRANULOCYTE %: 1 % (ref 0–1)
LYMPHOCYTE #: 1.02 10*3/uL (ref 1.00–4.80)
LYMPHOCYTE %: 22 %
MCH: 28.9 pg (ref 26.0–32.0)
MCHC: 32 g/dL (ref 31.0–35.5)
MCV: 90.3 fL (ref 78.0–100.0)
MONOCYTE #: 0.38 10*3/uL (ref 0.20–1.10)
MONOCYTE %: 8 %
MPV: 12.4 fL (ref 8.7–12.5)
NEUTROPHIL #: 3 10*3/uL (ref 1.50–7.70)
NEUTROPHIL %: 65 %
PLATELETS: 178 10*3/uL (ref 150–400)
RBC: 3.91 10*6/uL (ref 3.85–5.22)
RDW-CV: 12.8 % (ref 11.5–15.5)
WBC: 4.6 10*3/uL (ref 3.7–11.0)

## 2019-07-11 MED ORDER — SODIUM CHLORIDE 0.9 % (FLUSH) INJECTION SYRINGE
10.00 mL | INJECTION | Freq: Three times a day (TID) | INTRAMUSCULAR | Status: DC
Start: 2019-07-11 — End: 2019-07-12
  Administered 2019-07-11: 10 mL via INTRAVENOUS
  Administered 2019-07-11: 23:00:00

## 2019-07-11 MED ORDER — HEPARIN, PORCINE (PF) 100 UNIT/ML INTRAVENOUS SYRINGE
5.0000 mL | INJECTION | Freq: Once | INTRAVENOUS | Status: AC
Start: 2019-07-11 — End: 2019-07-11
  Administered 2019-07-11: 5 mL
  Filled 2019-07-11: qty 5

## 2019-07-11 NOTE — Nurses Notes (Signed)
Patient ambulatory into OPN using cane for assist, friend accompanying/driving her. Patient for scheduled for port flush. Skin warm, dry and pink. Port site to right upper chest area cleansed with chloraprep and allowed to dry prior to accessing with 19g power pac needle and flushed with NS 50m, no resistance met, flushed with heparin 565m needle removed, dressing applied, no bleeding. Ambulatory out of OPN without any complaints. [name]

## 2019-07-12 ENCOUNTER — Other Ambulatory Visit (INDEPENDENT_AMBULATORY_CARE_PROVIDER_SITE_OTHER): Payer: Self-pay | Admitting: Family

## 2019-07-12 LAB — HGA1C (HEMOGLOBIN A1C WITH EST AVG GLUCOSE)
ESTIMATED AVERAGE GLUCOSE: 249 mg/dL
HEMOGLOBIN A1C: 10.3 % — ABNORMAL HIGH (ref 4.0–6.0)

## 2019-08-26 ENCOUNTER — Other Ambulatory Visit: Payer: Self-pay

## 2019-09-12 ENCOUNTER — Other Ambulatory Visit: Payer: Self-pay

## 2019-09-12 ENCOUNTER — Ambulatory Visit
Admission: RE | Admit: 2019-09-12 | Discharge: 2019-09-12 | Disposition: A | Discharge status: Home / Self Care | Payer: Medicare Other | Source: Ambulatory Visit | Attending: Family Medicine | Admitting: Family Medicine

## 2019-09-12 DIAGNOSIS — C259 Malignant neoplasm of pancreas, unspecified: Secondary | ICD-10-CM | POA: Insufficient documentation

## 2019-09-12 NOTE — Nurses Notes (Signed)
To OPN ambulatory for port flush.  Alert, oriented and pleasant.  Color pink.  Skin warm and dry to touch.  Offers no complaints.  Port located in right upper chest.  Insertion area cleansed with Chloroprep and allowed to air dty.  Port then accessed with #[id number] [facility] needle and flushed with NSS with immediate blood return noted, followed by HL sol 59mL.  Needle then removed and 2x2 applied to site and secured with tape.  Pt. Tolerated well.  Left ambulatory.  Alvin Critchley, RN

## 2019-10-04 HISTORY — PX: COLONOSCOPY: WVUENDOPRO10

## 2019-10-07 ENCOUNTER — Other Ambulatory Visit (INDEPENDENT_AMBULATORY_CARE_PROVIDER_SITE_OTHER): Payer: Self-pay | Admitting: FAMILY PRACTICE

## 2019-10-07 DIAGNOSIS — I10 Essential (primary) hypertension: Secondary | ICD-10-CM

## 2019-10-07 MED ORDER — HYDROCODONE 5 MG-ACETAMINOPHEN 325 MG TABLET
1.00 | ORAL_TABLET | ORAL | 0 refills | Status: DC | PRN
Start: 2019-10-07 — End: 2019-12-25

## 2019-10-07 NOTE — Telephone Encounter (Signed)
Regarding: rx request  ----- Message from Glennis Brink, Office Staff sent at 10/07/2019  1:48 PM EDT -----  [name] [id number] sister, Raford Pitcher,  called in stating she is needing her hydrocodone called to [facility] in [place].    PCP: Myrene Buddy, MD  Please call patient at Telephone Information:  Mobile          [phone]      Thanks,  Glennis Brink, Office Staff

## 2019-10-07 NOTE — Telephone Encounter (Signed)
Rx refill completed and routed to Dr Karmen Bongo to review and sign off.    Lucretia Field, RN

## 2019-10-08 ENCOUNTER — Encounter (INDEPENDENT_AMBULATORY_CARE_PROVIDER_SITE_OTHER): Payer: Self-pay | Admitting: FAMILY PRACTICE

## 2019-10-08 ENCOUNTER — Other Ambulatory Visit (INDEPENDENT_AMBULATORY_CARE_PROVIDER_SITE_OTHER): Payer: Self-pay | Admitting: Family Medicine

## 2019-10-08 NOTE — Telephone Encounter (Signed)
Dr Karmen Bongo sent in the refill per patients request.  Lucretia Field, RN

## 2019-10-11 ENCOUNTER — Other Ambulatory Visit (INDEPENDENT_AMBULATORY_CARE_PROVIDER_SITE_OTHER): Payer: Self-pay

## 2019-10-11 ENCOUNTER — Encounter (INDEPENDENT_AMBULATORY_CARE_PROVIDER_SITE_OTHER): Payer: Self-pay | Admitting: FAMILY PRACTICE

## 2019-10-11 ENCOUNTER — Other Ambulatory Visit: Payer: Self-pay

## 2019-10-11 ENCOUNTER — Ambulatory Visit: Payer: Medicare Other | Attending: FAMILY PRACTICE | Admitting: FAMILY PRACTICE

## 2019-10-11 VITALS — BP 130/58 | HR 72 | Temp 98.3°F | Resp 18 | Ht 61.0 in | Wt 141.2 lb

## 2019-10-11 DIAGNOSIS — I2699 Other pulmonary embolism without acute cor pulmonale: Secondary | ICD-10-CM

## 2019-10-11 DIAGNOSIS — E119 Type 2 diabetes mellitus without complications: Secondary | ICD-10-CM | POA: Insufficient documentation

## 2019-10-11 DIAGNOSIS — C259 Malignant neoplasm of pancreas, unspecified: Secondary | ICD-10-CM | POA: Insufficient documentation

## 2019-10-11 DIAGNOSIS — Z66 Do not resuscitate: Secondary | ICD-10-CM | POA: Insufficient documentation

## 2019-10-11 DIAGNOSIS — Z87891 Personal history of nicotine dependence: Secondary | ICD-10-CM | POA: Insufficient documentation

## 2019-10-11 DIAGNOSIS — I509 Heart failure, unspecified: Secondary | ICD-10-CM | POA: Insufficient documentation

## 2019-10-11 DIAGNOSIS — G6289 Other specified polyneuropathies: Secondary | ICD-10-CM | POA: Insufficient documentation

## 2019-10-11 DIAGNOSIS — D709 Neutropenia, unspecified: Secondary | ICD-10-CM | POA: Insufficient documentation

## 2019-10-11 DIAGNOSIS — Z6826 Body mass index (BMI) 26.0-26.9, adult: Secondary | ICD-10-CM | POA: Insufficient documentation

## 2019-10-11 DIAGNOSIS — Z7189 Other specified counseling: Secondary | ICD-10-CM

## 2019-10-11 DIAGNOSIS — I10 Essential (primary) hypertension: Secondary | ICD-10-CM

## 2019-10-11 DIAGNOSIS — Z794 Long term (current) use of insulin: Secondary | ICD-10-CM | POA: Insufficient documentation

## 2019-10-11 DIAGNOSIS — Z7901 Long term (current) use of anticoagulants: Secondary | ICD-10-CM | POA: Insufficient documentation

## 2019-10-11 DIAGNOSIS — I110 Hypertensive heart disease with heart failure: Secondary | ICD-10-CM | POA: Insufficient documentation

## 2019-10-11 DIAGNOSIS — Z86711 Personal history of pulmonary embolism: Secondary | ICD-10-CM | POA: Insufficient documentation

## 2019-10-11 LAB — BASIC METABOLIC PANEL, FASTING
ANION GAP: 9 mmol/L (ref 8–16)
BUN/CREA RATIO: 14
BUN: 13 mg/dL (ref 7–17)
CALCIUM: 9 mg/dL (ref 8.4–10.2)
CHLORIDE: 95 mmol/L — ABNORMAL LOW (ref 98–107)
CO2 TOTAL: 28 mmol/L (ref 22–30)
CREATININE: 0.9 mg/dL (ref 0.52–1.04)
ESTIMATED GFR: >60 mL/min/{1.73_m2} (ref >60)
GLUCOSE: 290 mg/dL — ABNORMAL HIGH (ref 74–106)
POTASSIUM: 4.4 mmol/L (ref 3.5–5.1)
SODIUM: 132 mmol/L — ABNORMAL LOW (ref 137–145)

## 2019-10-11 LAB — CBC WITH DIFF
BASOPHIL #: <0.1 10*3/uL (ref <=0.20)
BASOPHIL %: 1 %
EOSINOPHIL #: 0.14 10*3/uL (ref <=0.50)
EOSINOPHIL %: 4 %
HCT: 36.3 % (ref 34.8–46.0)
HGB: 11.8 g/dL (ref 11.5–16.0)
IMMATURE GRANULOCYTE #: <0.1 10*3/uL (ref <0.10)
IMMATURE GRANULOCYTE %: 0 % (ref 0–1)
LYMPHOCYTE #: 0.61 10*3/uL — ABNORMAL LOW (ref 1.00–4.80)
LYMPHOCYTE %: 18 %
MCH: 29.2 pg (ref 26.0–32.0)
MCHC: 32.5 g/dL (ref 31.0–35.5)
MCV: 89.9 fL (ref 78.0–100.0)
MONOCYTE #: 0.35 10*3/uL (ref 0.20–1.10)
MONOCYTE %: 10 %
MPV: 12.7 fL — ABNORMAL HIGH (ref 8.7–12.5)
NEUTROPHIL #: 2.21 10*3/uL (ref 1.50–7.70)
NEUTROPHIL %: 67 %
PLATELETS: 102 10*3/uL — ABNORMAL LOW (ref 150–400)
RBC: 4.04 10*6/uL (ref 3.85–5.22)
RDW-CV: 12.7 % (ref 11.5–15.5)
WBC: 3.4 10*3/uL — ABNORMAL LOW (ref 3.7–11.0)

## 2019-10-11 NOTE — Patient Instructions (Signed)
RTC in 3 months with labs

## 2019-10-11 NOTE — Assessment & Plan Note (Signed)
BS at home some have been high.. last night 100's

## 2019-10-11 NOTE — Progress Notes (Signed)
Family Medicine, [facility]  [address] [place] [address]  [id number]      Patient Name:  [name]  MRN:  [id number]  DOB:  May 20, 1946    Date of Service:  10/11/2019    Chief Complaint:    Chief Complaint   Patient presents with   . Follow-up     chronic medical conditions    Progression of pancreatic cancer    HPI:  [name] is a 73 y.o. female who regular patient of mine who has multiple medical problems the most significant right now is that she has pancreatic cancer and her [place] recently did a repeat CT scans of her lungs and her abdomen showing evidence of progression of her disease.  This report was reviewed today with her.  She has a history of hypertension which is good today.  She has diabetes as a result of her pancreatic cancer and her sugars have been fluctuating today we will monitor that today.  Her sugar last night was 100. She has not been having hypoglycemic spells.  She has had problems with recurrent urinary tract infections in the past but is asymptomatic today.  According to her weights today she has lost 3 lb in the last year.  She is not having any nausea or vomiting.  She does have some problems with constipation.  She had a recent colonoscopy by Dr. Lelon Huh which she states was okay.  Medicines were reconciled as best we can.  She is not absolutely certain of the medication that she takes.  The person who was with her did not bring a list or her medications.  My nurse is calling the pharmacy to try to confirm her medication list.  She states she has not need any refills at this time.    She has had her flu shot for the year.  She has confirmed her DNR status today in the office.  He his spirits are good despite the news of the progression of her cancer.      Past Medical History:  Past Medical History:   Diagnosis Date   . Abdominal hernia 10/18/2017    hx of repair   . Anxiety    . Arthritis    . Asthma    . Atrial fibrillation  (CMS HCC)    . Back problem    . Bruises easily    . Cancer (CMS [place]) 10/18/2017    chemo and radiation completed 09/24/2017   . COPD (chronic obstructive pulmonary disease) (CMS HCC)    . CPAP (continuous positive airway pressure) dependence 10/18/2017    has not used recently   . Depression    . DM (diabetes mellitus) (CMS HCC) 2000    Fasting BG 200's. HGA1C 2018 6   . Edema    . Essential hypertension    . GERD (gastroesophageal reflux disease)    . Headache    . Heartburn    . Hx antineoplastic chemo 2018   . Hx of radiation therapy 2018   . Hypercholesteremia    . Hyperlipidemia    . Migraine 10/18/2017    none recently   . Obesity    . Palpitations    . Pancreatic cancer (CMS [place]) 03/2017   . Panic attack    . PE (pulmonary thromboembolism) (CMS HCC) 03/29/2018   . Peripheral neuropathy 10/18/2017    knees down, bilateral   . Sleep apnea    . Type 2 diabetes  mellitus (CMS [place]) 10/18/2017    Dx 2000 FBS 200s   . Unintentional weight loss 10/18/2017    25 lbs 03/2017   . Wears glasses          Past Surgical History:   Procedure Laterality Date   . CESAREAN SECTION     . COLONOSCOPY  10/04/2019    Diverticuli a colonoscopy by Dr. Tonye Becket   . HX CESAREAN SECTION     . HX CHOLECYSTECTOMY     . HX HERNIA REPAIR     . HX SUBCLAVIAN PORT IMPLANTION      s/p removed   . HX SUBCLAVIAN PORT IMPLANTION     . HX TONSIL AND ADENOIDECTOMY     . HX TONSILLECTOMY     . PANCREATICODUODENECTOMY  2018   . UMBILICAL HERNIA REPAIR      x2   . WHIPPLE PROCEDURE W/ LAPAROSCOPY        Current Outpatient Medications   Medication Sig   . acetaminophen (TYLENOL) 325 mg Oral Tablet 2 Tabs   . albuterol sulfate (PROVENTIL HFA) 90 mcg/actuation Inhalation HFA Aerosol Inhaler 2 Puffs   . apixaban (ELIQUIS) 2.5 mg Oral Tablet Take 1 Tab (2.5 mg total) by mouth Twice daily   . aspirin (ECOTRIN) 81 mg Oral Tablet, Delayed Release (E.C.) Take 1 Tab (81 mg total) by mouth Once a day   . atorvastatin (LIPITOR) 10 mg Oral Tablet Take 1 Tab (10  mg total) by mouth Every evening   . calcium carbonate-vitamin D3 (CALTRATE WITH VITAMIN D3) 600 mg(1,500mg ) -800 unit Oral Tablet Caltrate with Vitamin D3 600 mg (1,500 mg)-800 unit tablet   TK 1 T PO BID   . cetirizine (ZYRTEC) 10 mg Oral Tablet Take 1 Tab (10 mg total) by mouth Once a day   . citalopram (CELEXA) 20 mg Oral Tablet Take 1 Tab by mouth Once a day   . diphenoxylate-atropine (LOMOTIL) 2.5-0.025 mg Oral Tablet Take 1 Tab by mouth   . enoxaparin (LOVENOX) 100 mg/mL Subcutaneous Syringe enoxaparin 100 mg/mL subcutaneous syringe   . fluconazole (DIFLUCAN) 100 mg Oral Tablet TK 1 T PO QD   . furosemide (LASIX) 20 mg Oral Tablet Take 2 Tabs (40 mg total) by mouth Once a day   . gabapentin (NEURONTIN) 100 mg Oral Capsule gabapentin 100 mg capsule   . gabapentin (NEURONTIN) 300 mg Oral Capsule 1 Cap   . hydrALAZINE (APRESOLINE) 10 mg Oral Tablet Take 1 Tab (10 mg total) by mouth Every 8 hours   . HYDROcodone-acetaminophen (NORCO) 5-325 mg Oral Tablet Take 1 Tab by mouth Every 4 hours as needed for up to 30 days   . insulin aspart U-100 (NOVOLOG FLEXPEN U-100 INSULIN) 100 unit/mL (3 mL) Subcutaneous Insulin Pen Novolog Flexpen U-100 Insulin aspart 100 unit/mL (3 mL) subcutaneous   . insulin glargine (LANTUS SOLOSTAR U-100 INSULIN) 100 unit/mL Subcutaneous Insulin Pen 8 Units by Subcutaneous route Every night   . insulin lispro (HUMALOG U-100 INSULIN) 100 unit/mL Subcutaneous Solution Inject as per sliding scale: if 0 - 150 = 0 Coverage; 151 - 200 = 2 Units; 201 - 250 = 4 Units; 251 - 300 = 6 Units; 301 - 350 = 8 Units; 351 - 400 = 10 Units Notify Provider if less than 70 or greater than 400., subcutaneously before meals and at bedtime related to TYPE 2 DIABETES MELLITUS WITHOUT COMPLICATIONS ([id number])   . insulin pen needles (NANO PEN NEEDLE) 32 gauge x 5/32" Needle BD  Ultra-Fine Nano Pen Needle 32 gauge x 5/32"   . losartan (COZAAR) 50 mg Oral Tablet Every one hour   . magnesium oxide (MAG-OX) 400 mg Oral  Tablet magnesium oxide 400 mg (241.3 mg magnesium) tablet   TAKE 1 TABLET BY MOUTH DAILY   . magnesium oxide 200 Oral Tablet magnesium   . melatonin 3 mg Oral Capsule 1 Cap   . MetFORMIN (GLUCOPHAGE) 1,000 mg Oral Tablet metformin 1,000 mg tablet   1 tab q day   . metFORMIN (GLUCOPHAGE) 500 mg Oral Tablet Take 1 Tab by mouth Once a day   . metoprolol succinate (TOPROL-XL) 25 mg Oral Tablet Sustained Release 24 hr Take 1 Tab (25 mg total) by mouth Once a day   . montelukast (SINGULAIR) 10 mg Oral Tablet Take 1 Tab (10 mg total) by mouth Every evening   . MOVANTIK 25 mg Oral Tablet    . multivit with min-folic acid (ADULT MULTIVITAMIN GUMMIES) 200 mcg Oral Tablet, Chewable Take 2 Tabs by mouth   . NANO PEN NEEDLE 32 gauge x 5/32" Needle USE WITH INSULIN FLEXPEN BID AS NEEDED   . omeprazole (PRILOSEC) 40 mg Oral Capsule, Delayed Release(E.C.) Take 1 Cap (40 mg total) by mouth Once a day   . ondansetron (ZOFRAN ODT) 4 mg Oral Tablet, Rapid Dissolve Every 6 hours   . pancreatic enzyme replacement (CREON) 12,000-38,000 -60,000 unit Oral Capsule, Delayed Release(E.C.) Take by mouth Three times daily with meals 2 CAPS WITH MEALS AND 1 CAP WITH EACH SNACK   . RELISTOR 150 mg Oral Tablet TAKE 3 TABLETS BY MOUTH DAILY     Allergies   Allergen Reactions   . Sulfa (Sulfonamides) Itching       Family History:  Family Medical History:     Problem Relation (Age of Onset)    Diabetes Mother    Heart Disease Mother    Respiratory Problems Father              Social History:   Social History     Tobacco Use   Smoking Status Former Smoker   . Packs/day: 0.50   . Years: 20.00   . Pack years: 10.00   . Types: Cigarettes   Smokeless Tobacco Never Used   Tobacco Comment    unsure of quit date     Social History     Substance and Sexual Activity   Alcohol Use No     Social History     Occupational History   . Not on file       Review of Systems:  Review of Systems   Constitutional: Positive for weight loss. Negative for chills and fever.      Eyes: Negative for blurred vision and double vision.   Respiratory: Positive for shortness of breath. Negative for cough, hemoptysis, sputum production and wheezing.    Cardiovascular: Negative for chest pain, orthopnea and leg swelling.   Gastrointestinal: Positive for abdominal pain and constipation. Negative for blood in stool, heartburn, melena, nausea and vomiting.   Genitourinary: Negative for dysuria.   Musculoskeletal: Negative for myalgias.   Skin: Negative for rash.   Neurological: Negative for dizziness.   Endo/Heme/Allergies: Bruises/bleeds easily.          Problem List:  Patient Active Problem List   Diagnosis   . Pancreatic cancer (CMS [place])   . Asthma   . Type 2 diabetes mellitus (CMS HCC)   . Open abdominal wall wound   . Hypokalemia   .  PE (pulmonary thromboembolism) (CMS HCC)   . Anasarca   . Headache   . Hyponatremia   . COPD (chronic obstructive pulmonary disease) (CMS HCC)   . Essential hypertension   . CHF (congestive heart failure) (CMS HCC)   . DNR (do not resuscitate)   . Hyperlipidemia   . 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 Sepsis (CMS [place])   . Lung nodules   . Neutropenia, unspecified (CMS HCC)   . Other specified polyneuropathies   . Morbid (severe) obesity due to excess calories (CMS HCC)       Physical Examination:  BP (!) 130/58   Pulse 72   Temp 36.8 C (98.3 F)   Resp 18   Ht 1.549 m (5\' 1" )   Wt 64 kg (141 lb 3.2 oz)   SpO2 99%   BMI 26.68 kg/m       Physical Exam   Constitutional: She is oriented to person, place, and time and well-developed, well-nourished, and in no distress. No distress.   HENT:   Head: Normocephalic and atraumatic.   Right Ear: External ear normal.   Left Ear: External ear normal.   Nose: Nose normal.   Mouth/Throat: Oropharynx is clear and moist. No oropharyngeal exudate.   Eyes: Pupils are equal, round, and reactive to light. Conjunctivae and EOM are normal. Right eye exhibits no discharge. Left eye exhibits no discharge.  No scleral icterus.   Neck: Normal range of motion. Neck supple. No JVD present. No tracheal deviation present. No thyromegaly present.   Cardiovascular: Normal rate and regular rhythm. Exam reveals no gallop and no friction rub.   Murmur heard.   Systolic murmur is present with a grade of 2/6.  Pulses:       Carotid pulses are on the right side with bruit and on the left side with bruit.  Pulmonary/Chest: No stridor. No respiratory distress. She has no wheezes. She has no rales. She exhibits no tenderness.   Abdominal: Soft. Bowel sounds are normal. She exhibits no distension and no mass. There is no abdominal tenderness. There is no rebound and no guarding.   Musculoskeletal: Normal range of motion.         General: No tenderness, deformity or edema.   Lymphadenopathy:     She has no cervical adenopathy.   Neurological: She is alert and oriented to person, place, and time. No cranial nerve deficit.   Walking with cane   Skin: Skin is warm and dry. No rash noted. She is not diaphoretic. No erythema.   Psychiatric: Affect and judgment normal.   Nursing note and vitals reviewed.       Data Reviewed:  CT of chest and abdomen done at [facility] was reviewed     See attachment    Health Maintenance:  Health Maintenance   Topic Date Due   . Adult Tdap-Td (1 - Tdap) 03/17/1967   . Mammography  03/16/1998   . Shingles Vaccine (1 of 2) 03/16/1998   . Colonoscopy  03/16/1998   . Osteoporosis screening  03/16/2013   . Annual Wellness Visit  10/18/2017   . Influenza Vaccine (1) 08/13/2019   . Depression Screening  04/14/2020   . Hemoglobin A1C for Diabetes Control  07/10/2020   . Pneumococcal 65+ Years Low Risk  Completed   . Hepatitis C screening  Completed      Assessment/Plan:  Problem List Items Addressed This Visit  Unprioritized    Essential hypertension (Chronic)     Good today         Relevant Orders    BASIC METABOLIC PANEL, FASTING    HGA1C (HEMOGLOBIN A1C WITH EST AVG GLUCOSE)    CBC/DIFF    CHF  (congestive heart failure) (CMS HCC) (Chronic)     SOB is stable.           DNR (do not resuscitate) (Chronic)     Confirmed DNR status today         Type 2 diabetes mellitus (CMS HCC)     BS at home some have been high.. last night 100's         Relevant Orders    HGA1C (HEMOGLOBIN A1C WITH EST AVG GLUCOSE)    PE (pulmonary thromboembolism) (CMS HCC)     Taking Eliquis         Neutropenia, unspecified (CMS HCC)    Other specified polyneuropathies    Morbid (severe) obesity due to excess calories (CMS [facility])      Other Visit Diagnoses     Advance care planning    -  Primary    Relevant Orders    NO  CPR         Orders Placed This Encounter   . BASIC METABOLIC PANEL, FASTING   . HGA1C (HEMOGLOBIN A1C WITH EST AVG GLUCOSE)   . CBC/DIFF           Follow up:  Return in about 3 months (around 01/11/2020).      Myrene Buddy, MD  10/11/2019, 09:50

## 2019-10-11 NOTE — Assessment & Plan Note (Signed)
Good today

## 2019-10-11 NOTE — Assessment & Plan Note (Signed)
Confirmed DNR status today

## 2019-10-11 NOTE — Assessment & Plan Note (Signed)
Taking Eliquis. 

## 2019-10-11 NOTE — Nursing Note (Signed)
Pt here for 1 mo follow up. Pt reports she has been having intermittent abdominal pain. Pt received call from CA specialist and and was told that her "cancer blood work levels had increased" and that she needed to have a "CT of her whole body". CT was done yesterday at the [facility] in [place]. No refill needed. Has received flu vaccine for this season. BE

## 2019-10-11 NOTE — Assessment & Plan Note (Signed)
SOB is stable.

## 2019-10-17 ENCOUNTER — Ambulatory Visit (INDEPENDENT_AMBULATORY_CARE_PROVIDER_SITE_OTHER): Payer: Medicare Other

## 2019-10-21 ENCOUNTER — Ambulatory Visit: Payer: Medicare Other | Attending: Rheumatology | Admitting: Rheumatology

## 2019-10-21 DIAGNOSIS — Z008 Encounter for other general examination: Secondary | ICD-10-CM

## 2019-11-11 ENCOUNTER — Ambulatory Visit (INDEPENDENT_AMBULATORY_CARE_PROVIDER_SITE_OTHER): Payer: Medicare Other

## 2019-11-14 ENCOUNTER — Other Ambulatory Visit (INDEPENDENT_AMBULATORY_CARE_PROVIDER_SITE_OTHER): Payer: Self-pay | Admitting: FAMILY PRACTICE

## 2019-11-25 ENCOUNTER — Other Ambulatory Visit (INDEPENDENT_AMBULATORY_CARE_PROVIDER_SITE_OTHER): Payer: Self-pay | Admitting: FAMILY PRACTICE

## 2019-11-25 MED ORDER — LANTUS SOLOSTAR U-100 INSULIN 100 UNIT/ML (3 ML) SUBCUTANEOUS PEN
10.00 [IU] | PEN_INJECTOR | Freq: Every evening | SUBCUTANEOUS | 5 refills | Status: DC
Start: 2019-11-25 — End: 2020-07-30

## 2019-11-28 ENCOUNTER — Encounter (INDEPENDENT_AMBULATORY_CARE_PROVIDER_SITE_OTHER): Payer: Self-pay | Admitting: Vascular & Interventional Radiology

## 2019-12-03 LAB — HISTORIC CASE ADDENDUM/AMENDMENT

## 2019-12-04 LAB — CBC
HCT: 30
HGB: 9.9
MCH: 32.2
MCHC: 32.9
MCV: 98
PLATELET COUNT: 161
RBC: 3.07
RDW: 16.8
WBC: 20.6

## 2019-12-04 LAB — COMPREHENSIVE METABOLIC PANEL
A/G RATIO: 1.3
ALBUMIN: 2.8 g/dL
ALKALINE PHOSPHATASE: 99 IU/L
ALT (SGPT): 11 IU/L
AST (SGOT): 28 IU/L
BILIRUBIN, TOTAL: 0.5 mg/dL
BUN/CREATININE RATIO: 20
BUN: 18 mg/dL
CALCIUM: 8.3 mg/dL
CARBON DIOXIDE, TOTAL: 25 mmol/L
CHLORIDE: 101 mmol/L
CREATININE: 0.9 mg/dL
EGFR IF NONAFRICN AM: 61.7 mL/min/{1.73_m2}
GLOBULIN, TOTAL: 2.2 g/dL
GLUCOSE: 235 mg/dL
POTASSIUM: 4 mmol/L
PROTEIN, TOTAL: 5 g/dL
SODIUM: 130 mmol/L

## 2019-12-09 ENCOUNTER — Encounter (INDEPENDENT_AMBULATORY_CARE_PROVIDER_SITE_OTHER): Payer: Self-pay | Admitting: FAMILY PRACTICE

## 2019-12-19 ENCOUNTER — Encounter (INDEPENDENT_AMBULATORY_CARE_PROVIDER_SITE_OTHER): Payer: Self-pay | Admitting: FAMILY PRACTICE

## 2019-12-19 ENCOUNTER — Other Ambulatory Visit (INDEPENDENT_AMBULATORY_CARE_PROVIDER_SITE_OTHER): Payer: Self-pay | Admitting: FAMILY PRACTICE

## 2019-12-19 MED ORDER — MAGNESIUM OXIDE 400 MG (241.3 MG MAGNESIUM) TABLET
400.0000 mg | ORAL_TABLET | Freq: Every day | ORAL | 5 refills | Status: DC
Start: 2019-12-19 — End: 2019-12-26

## 2019-12-20 ENCOUNTER — Encounter (INDEPENDENT_AMBULATORY_CARE_PROVIDER_SITE_OTHER): Payer: Self-pay | Admitting: FAMILY PRACTICE

## 2019-12-25 ENCOUNTER — Other Ambulatory Visit (INDEPENDENT_AMBULATORY_CARE_PROVIDER_SITE_OTHER): Payer: Self-pay | Admitting: FAMILY PRACTICE

## 2019-12-25 MED ORDER — HYDROCODONE 5 MG-ACETAMINOPHEN 325 MG TABLET
1.00 | ORAL_TABLET | ORAL | 0 refills | Status: DC | PRN
Start: 2019-12-25 — End: 2020-02-24

## 2019-12-26 ENCOUNTER — Other Ambulatory Visit (INDEPENDENT_AMBULATORY_CARE_PROVIDER_SITE_OTHER): Payer: Self-pay | Admitting: FAMILY PRACTICE

## 2019-12-26 MED ORDER — MAGNESIUM OXIDE 400 MG (241.3 MG MAGNESIUM) TABLET
400.0000 mg | ORAL_TABLET | Freq: Every day | ORAL | 5 refills | Status: AC
Start: 2019-12-26 — End: 2020-01-25

## 2020-01-06 ENCOUNTER — Other Ambulatory Visit (INDEPENDENT_AMBULATORY_CARE_PROVIDER_SITE_OTHER): Payer: Self-pay | Admitting: FAMILY PRACTICE

## 2020-01-06 MED ORDER — PEN NEEDLE, DIABETIC 32 GAUGE X 5/32"
1.00 | Freq: Two times a day (BID) | 11 refills | Status: DC
Start: 2020-01-06 — End: 2020-01-31

## 2020-01-13 ENCOUNTER — Other Ambulatory Visit (INDEPENDENT_AMBULATORY_CARE_PROVIDER_SITE_OTHER): Payer: Self-pay | Admitting: Family Medicine

## 2020-01-13 ENCOUNTER — Encounter (INDEPENDENT_AMBULATORY_CARE_PROVIDER_SITE_OTHER): Payer: Self-pay | Admitting: FAMILY PRACTICE

## 2020-01-22 ENCOUNTER — Other Ambulatory Visit (INDEPENDENT_AMBULATORY_CARE_PROVIDER_SITE_OTHER): Payer: Self-pay | Admitting: FAMILY PRACTICE

## 2020-01-22 MED ORDER — INSULIN ASPART (U-100) 100 UNIT/ML (3 ML) SUBCUTANEOUS PEN
7.00 [IU] | PEN_INJECTOR | Freq: Two times a day (BID) | SUBCUTANEOUS | 3 refills | Status: DC
Start: 2020-01-22 — End: 2020-03-24

## 2020-01-27 ENCOUNTER — Telehealth (INDEPENDENT_AMBULATORY_CARE_PROVIDER_SITE_OTHER): Payer: Self-pay | Admitting: FAMILY PRACTICE

## 2020-01-27 MED ORDER — NYSTATIN 100,000 UNIT/GRAM TOPICAL POWDER
Freq: Four times a day (QID) | CUTANEOUS | 5 refills | Status: DC
Start: 2020-01-27 — End: 2020-01-31

## 2020-01-27 NOTE — Telephone Encounter (Signed)
Noted.  Orders faxed to [facility] for review and to discuss with patient.    I did leave a message on patients home phone, but was not able to speak with patient.  Had to leave a message with taking xtra lasix.    Lucretia Field, RN

## 2020-01-27 NOTE — Telephone Encounter (Signed)
Nurse Marlowe Kays with [facility] called.    She did a post Hospital followup  Patient was admitted to [facility] for a UTI and was in for 9 days.    Patient said while in [facility], she only got her lasix once.    Nurse connie reports today she is having 3+ to 4+ pitting edema dn her hands are even puffy.  She went back to her 40mg  lasix daily feet elevation    The [facility] discharged her on Prednisone, Augmentin and cardizem   Patient has appt tomorrow with Dr Karmen Bongo that they HOPE to make, but already saying they may cancel if weather is bad.  She also has deep Red sore areas under her FOLDS and breasts and feels NYSTOP would help  Patient is getting in home PT also    Nurse is requesting:  1. xtra lasix if ok  2. Nystop  3.  Any lab orders    Please fax orders back to [facility]  Lucretia Field, RN

## 2020-01-27 NOTE — Telephone Encounter (Signed)
Nurse can give her an extra dose of lasix today and tomorrow, then back to usual dose.  Recheck BMP on Thursday.  If the weather is bad tell them to stay home.  I will send in Rx for the nystatin topical sent to [facility].  [email or web address]

## 2020-01-28 ENCOUNTER — Other Ambulatory Visit (INDEPENDENT_AMBULATORY_CARE_PROVIDER_SITE_OTHER): Payer: Self-pay | Admitting: FAMILY PRACTICE

## 2020-01-28 ENCOUNTER — Encounter (INDEPENDENT_AMBULATORY_CARE_PROVIDER_SITE_OTHER): Payer: Self-pay | Admitting: FAMILY PRACTICE

## 2020-01-30 ENCOUNTER — Encounter (INDEPENDENT_AMBULATORY_CARE_PROVIDER_SITE_OTHER): Payer: Self-pay | Admitting: FAMILY PRACTICE

## 2020-01-30 ENCOUNTER — Other Ambulatory Visit: Payer: Self-pay

## 2020-01-30 ENCOUNTER — Ambulatory Visit: Payer: Medicare Other | Attending: FAMILY PRACTICE

## 2020-01-30 DIAGNOSIS — N390 Urinary tract infection, site not specified: Secondary | ICD-10-CM | POA: Insufficient documentation

## 2020-01-30 DIAGNOSIS — E119 Type 2 diabetes mellitus without complications: Secondary | ICD-10-CM | POA: Insufficient documentation

## 2020-01-30 LAB — BASIC METABOLIC PANEL
ANION GAP: 7 mmol/L (ref 4–13)
BUN/CREA RATIO: 26 — ABNORMAL HIGH (ref 6–22)
BUN: 25 mg/dL (ref 8–25)
CALCIUM: 7.4 mg/dL — ABNORMAL LOW (ref 8.8–10.2)
CHLORIDE: 97 mmol/L (ref 96–111)
CO2 TOTAL: 28 mmol/L (ref 23–31)
CREATININE: 0.97 mg/dL (ref 0.60–1.05)
ESTIMATED GFR: 59 mL/min/BSA — ABNORMAL LOW (ref >=60)
GLUCOSE: 235 mg/dL — ABNORMAL HIGH (ref 65–125)
POTASSIUM: 4.7 mmol/L (ref 3.5–5.1)
SODIUM: 132 mmol/L — ABNORMAL LOW (ref 136–145)

## 2020-01-31 ENCOUNTER — Other Ambulatory Visit: Payer: Self-pay

## 2020-01-31 ENCOUNTER — Emergency Department (HOSPITAL_COMMUNITY): Payer: Medicare Other

## 2020-01-31 ENCOUNTER — Inpatient Hospital Stay
Admission: EM | Admit: 2020-01-31 | Discharge: 2020-02-06 | DRG: 292 | Disposition: A | Discharge status: Home Health | Payer: Medicare Other | Attending: Family Medicine | Admitting: Family Medicine

## 2020-01-31 ENCOUNTER — Encounter (HOSPITAL_COMMUNITY): Payer: Self-pay

## 2020-01-31 DIAGNOSIS — Z87891 Personal history of nicotine dependence: Secondary | ICD-10-CM | POA: Insufficient documentation

## 2020-01-31 DIAGNOSIS — I110 Hypertensive heart disease with heart failure: Principal | ICD-10-CM | POA: Diagnosis present

## 2020-01-31 DIAGNOSIS — Z7982 Long term (current) use of aspirin: Secondary | ICD-10-CM

## 2020-01-31 DIAGNOSIS — Z20822 Contact with and (suspected) exposure to covid-19: Secondary | ICD-10-CM | POA: Insufficient documentation

## 2020-01-31 DIAGNOSIS — Z7901 Long term (current) use of anticoagulants: Secondary | ICD-10-CM

## 2020-01-31 DIAGNOSIS — E785 Hyperlipidemia, unspecified: Secondary | ICD-10-CM | POA: Diagnosis present

## 2020-01-31 DIAGNOSIS — I509 Heart failure, unspecified: Secondary | ICD-10-CM | POA: Diagnosis present

## 2020-01-31 DIAGNOSIS — Z86711 Personal history of pulmonary embolism: Secondary | ICD-10-CM

## 2020-01-31 DIAGNOSIS — E7800 Pure hypercholesterolemia, unspecified: Secondary | ICD-10-CM | POA: Diagnosis present

## 2020-01-31 DIAGNOSIS — J449 Chronic obstructive pulmonary disease, unspecified: Secondary | ICD-10-CM | POA: Diagnosis present

## 2020-01-31 DIAGNOSIS — E119 Type 2 diabetes mellitus without complications: Secondary | ICD-10-CM

## 2020-01-31 DIAGNOSIS — R0602 Shortness of breath: Secondary | ICD-10-CM | POA: Insufficient documentation

## 2020-01-31 DIAGNOSIS — Z794 Long term (current) use of insulin: Secondary | ICD-10-CM

## 2020-01-31 DIAGNOSIS — I4891 Unspecified atrial fibrillation: Secondary | ICD-10-CM | POA: Diagnosis present

## 2020-01-31 DIAGNOSIS — E1142 Type 2 diabetes mellitus with diabetic polyneuropathy: Secondary | ICD-10-CM | POA: Diagnosis present

## 2020-01-31 DIAGNOSIS — F410 Panic disorder [episodic paroxysmal anxiety] without agoraphobia: Secondary | ICD-10-CM | POA: Diagnosis present

## 2020-01-31 DIAGNOSIS — K219 Gastro-esophageal reflux disease without esophagitis: Secondary | ICD-10-CM | POA: Diagnosis present

## 2020-01-31 DIAGNOSIS — J441 Chronic obstructive pulmonary disease with (acute) exacerbation: Secondary | ICD-10-CM | POA: Diagnosis present

## 2020-01-31 DIAGNOSIS — R519 Headache, unspecified: Secondary | ICD-10-CM

## 2020-01-31 HISTORY — DX: Urinary tract infection, site not specified: N39.0

## 2020-01-31 LAB — URINALYSIS, MACROSCOPIC
BILIRUBIN: NEGATIVE mg/dL
GLUCOSE: NEGATIVE mg/dL
KETONES: NEGATIVE mg/dL
LEUKOCYTES: NEGATIVE WBCs/uL
NITRITE: NEGATIVE
PH: 6 (ref 5.0–8.5)
PROTEIN: 100 mg/dL — AB
SPECIFIC GRAVITY: 1.02 (ref 1.005–1.030)
UROBILINOGEN: 0.2 mg/dL

## 2020-01-31 LAB — D-DIMER: D-DIMER: 2.01 mg/L — ABNORMAL HIGH (ref 0.00–0.59)

## 2020-01-31 LAB — COMPREHENSIVE METABOLIC PANEL, NON-FASTING
ALBUMIN: 1.8 g/dL — ABNORMAL LOW (ref 3.4–4.8)
ALKALINE PHOSPHATASE: 88 U/L (ref 55–145)
ALT (SGPT): 9 U/L (ref 8–22)
ANION GAP: 8 mmol/L (ref 4–13)
AST (SGOT): 18 U/L (ref 8–45)
BILIRUBIN TOTAL: 0.3 mg/dL (ref 0.3–1.3)
BUN/CREA RATIO: 29 — ABNORMAL HIGH (ref 6–22)
BUN: 26 mg/dL — ABNORMAL HIGH (ref 8–25)
CALCIUM: 7.4 mg/dL — ABNORMAL LOW (ref 8.8–10.2)
CHLORIDE: 99 mmol/L (ref 96–111)
CO2 TOTAL: 25 mmol/L (ref 23–31)
CREATININE: 0.9 mg/dL (ref 0.60–1.05)
ESTIMATED GFR: 65 mL/min/BSA (ref >=60)
GLUCOSE: 186 mg/dL — ABNORMAL HIGH (ref 65–125)
POTASSIUM: 4 mmol/L (ref 3.5–5.1)
PROTEIN TOTAL: 4.3 g/dL — ABNORMAL LOW (ref 6.0–8.0)
SODIUM: 132 mmol/L — ABNORMAL LOW (ref 136–145)

## 2020-01-31 LAB — CBC WITH DIFF
BASOPHIL #: <0.1 10*3/uL (ref <=0.20)
BASOPHIL %: 0 %
EOSINOPHIL #: 0.12 10*3/uL (ref <=0.50)
EOSINOPHIL %: 1 %
HCT: 27.2 % — ABNORMAL LOW (ref 34.8–46.0)
HGB: 8.7 g/dL — ABNORMAL LOW (ref 11.5–16.0)
IMMATURE GRANULOCYTE #: <0.1 10*3/uL (ref <0.10)
IMMATURE GRANULOCYTE %: 1 % (ref 0–1)
LYMPHOCYTE #: 0.56 10*3/uL — ABNORMAL LOW (ref 1.00–4.80)
LYMPHOCYTE %: 7 %
MCH: 32.2 pg — ABNORMAL HIGH (ref 26.0–32.0)
MCHC: 32 g/dL (ref 31.0–35.5)
MCV: 100.7 fL — ABNORMAL HIGH (ref 78.0–100.0)
MONOCYTE #: 1 10*3/uL (ref 0.20–1.10)
MONOCYTE %: 12 %
MPV: 11.8 fL (ref 8.7–12.5)
NEUTROPHIL #: 6.74 10*3/uL (ref 1.50–7.70)
NEUTROPHIL %: 79 %
PLATELETS: 136 10*3/uL — ABNORMAL LOW (ref 150–400)
RBC: 2.7 10*6/uL — ABNORMAL LOW (ref 3.85–5.22)
RDW-CV: 19.9 % — ABNORMAL HIGH (ref 11.5–15.5)
WBC: 8.5 10*3/uL (ref 3.7–11.0)

## 2020-01-31 LAB — RED TOP TUBE

## 2020-01-31 LAB — TROPONIN-I
TROPONIN I: 248 ng/L — ABNORMAL HIGH (ref 7–30)
TROPONIN I: 205 ng/L — ABNORMAL HIGH (ref 7–30)

## 2020-01-31 LAB — ARTERIAL BLOOD GAS
%FIO2 (ARTERIAL): 24 %
ALLEN TEST: POSITIVE
RESPIRATORY RATE: 18

## 2020-01-31 LAB — POC BLOOD GLUCOSE (RESULTS): GLUCOSE, POC: 293 mg/dL — ABNORMAL HIGH (ref 60–110)

## 2020-01-31 LAB — ARTERIAL BLOOD GAS - MLS OP ONLY
BASE EXCESS (ARTERIAL): 2.3 mmol/L — ABNORMAL HIGH (ref ?–2.0)
BICARBONATE (ARTERIAL): 25.7 mmol/L (ref 24.0–26.0)
O2 SATURATION (ARTERIAL): 91.5 % (ref 88.0–100.0)
PCO2 (ARTERIAL): 35 mmHg (ref 35.0–45.0)
PH (ARTERIAL): 7.48 — ABNORMAL HIGH (ref 7.35–7.45)
PO2 (ARTERIAL): 61.4 mmHg — ABNORMAL LOW (ref 80.0–100.0)

## 2020-01-31 LAB — URINALYSIS, MICROSCOPIC

## 2020-01-31 LAB — B-TYPE NATRIURETIC PEPTIDE (BNP),PLASMA: BNP: 1420 pg/mL — ABNORMAL HIGH (ref <=99)

## 2020-01-31 LAB — GRAY TOP TUBE

## 2020-01-31 MED ORDER — LORATADINE 10 MG TABLET
10.0000 mg | ORAL_TABLET | Freq: Every day | ORAL | Status: DC
Start: 2020-01-31 — End: 2020-02-06
  Administered 2020-01-31 – 2020-02-06 (×7): 10 mg via ORAL
  Filled 2020-01-31 (×7): qty 1

## 2020-01-31 MED ORDER — DILTIAZEM 30 MG TABLET
30.00 mg | ORAL_TABLET | Freq: Four times a day (QID) | ORAL | Status: DC
Start: 2020-01-31 — End: 2020-02-06
  Administered 2020-01-31 – 2020-02-06 (×24): 30 mg via ORAL
  Filled 2020-01-31 (×24): qty 1

## 2020-01-31 MED ORDER — DEXTROSE 50 % IN WATER (D50W) INTRAVENOUS SYRINGE
12.50 g | INJECTION | INTRAVENOUS | Status: DC | PRN
Start: 2020-01-31 — End: 2020-02-06

## 2020-01-31 MED ORDER — METOPROLOL SUCCINATE ER 25 MG TABLET,EXTENDED RELEASE 24 HR
25.0000 mg | ORAL_TABLET | Freq: Every day | ORAL | Status: DC
Start: 2020-02-01 — End: 2020-02-06
  Administered 2020-02-01 – 2020-02-06 (×6): 25 mg via ORAL
  Filled 2020-01-31 (×6): qty 1

## 2020-01-31 MED ORDER — SODIUM CHLORIDE 0.9 % IV BOLUS
50.0000 mL | INJECTION | Status: DC
Start: 2020-01-31 — End: 2020-02-06

## 2020-01-31 MED ORDER — ONDANSETRON HCL (PF) 4 MG/2 ML INJECTION SOLUTION
4.0000 mg | Freq: Four times a day (QID) | INTRAMUSCULAR | Status: DC | PRN
Start: 2020-01-31 — End: 2020-02-06

## 2020-01-31 MED ORDER — INSULIN GLARGINE 100 UNITS/ML SUBQ - CHARGE BY DOSE
10.00 [IU] | Freq: Every evening | SUBCUTANEOUS | Status: DC
Start: 2020-01-31 — End: 2020-02-06
  Administered 2020-01-31 – 2020-02-05 (×6): 10 [IU] via SUBCUTANEOUS
  Filled 2020-01-31 (×6): qty 10

## 2020-01-31 MED ORDER — IOPAMIDOL 370 MG IODINE/ML (76 %) INTRAVENOUS SOLUTION
100.0000 mL | INTRAVENOUS | Status: AC
Start: 2020-01-31 — End: 2020-01-31
  Administered 2020-01-31: 15:00:00 100 mL via INTRAVENOUS

## 2020-01-31 MED ORDER — ALUMINUM-MAG HYDROXIDE-SIMETHICONE 200 MG-200 MG-20 MG/5 ML ORAL SUSP
20.0000 mL | ORAL | Status: DC | PRN
Start: 2020-01-31 — End: 2020-02-06

## 2020-01-31 MED ORDER — AEROCHAMBER W/ FLOWSIGNAL SPACER
Freq: Once | Status: AC
Start: 2020-01-31 — End: 2020-01-31

## 2020-01-31 MED ORDER — ACETAMINOPHEN 325 MG TABLET
650.0000 mg | ORAL_TABLET | ORAL | Status: DC | PRN
Start: 2020-01-31 — End: 2020-02-06
  Administered 2020-02-01 (×2): 650 mg via ORAL
  Filled 2020-01-31 (×2): qty 2

## 2020-01-31 MED ORDER — HYDRALAZINE 10 MG TABLET
10.00 mg | ORAL_TABLET | Freq: Three times a day (TID) | ORAL | Status: DC
Start: 2020-01-31 — End: 2020-02-06
  Administered 2020-01-31 – 2020-02-06 (×17): 10 mg via ORAL
  Filled 2020-01-31 (×17): qty 1

## 2020-01-31 MED ORDER — LIPASE-PROTEASE-AMYLASE 5,000-17,000-27,000 UNIT CAPSULE,DELAYED REL
1.00 | DELAYED_RELEASE_CAPSULE | Freq: Three times a day (TID) | ORAL | Status: DC
Start: 2020-01-31 — End: 2020-02-01

## 2020-01-31 MED ORDER — FUROSEMIDE 10 MG/ML INJECTION SOLUTION
40.00 mg | Freq: Two times a day (BID) | INTRAMUSCULAR | Status: DC
Start: 2020-01-31 — End: 2020-02-06
  Administered 2020-01-31: 0 mg via INTRAVENOUS
  Administered 2020-02-01 – 2020-02-06 (×11): 40 mg via INTRAVENOUS
  Filled 2020-01-31 (×12): qty 4

## 2020-01-31 MED ORDER — MAGNESIUM OXIDE 400 MG (241.3 MG MAGNESIUM) TABLET
400.00 mg | ORAL_TABLET | Freq: Two times a day (BID) | ORAL | Status: DC
Start: 2020-01-31 — End: 2020-02-06
  Administered 2020-01-31 – 2020-02-06 (×12): 400 mg via ORAL
  Filled 2020-01-31 (×12): qty 1

## 2020-01-31 MED ORDER — HYDROCODONE 5 MG-ACETAMINOPHEN 325 MG TABLET
1.00 | ORAL_TABLET | ORAL | Status: DC | PRN
Start: 2020-01-31 — End: 2020-02-06
  Administered 2020-01-31 – 2020-02-06 (×11): 1 via ORAL
  Filled 2020-01-31 (×11): qty 1

## 2020-01-31 MED ORDER — FUROSEMIDE 10 MG/ML INJECTION SOLUTION
40.00 mg | INTRAMUSCULAR | Status: AC
Start: 2020-01-31 — End: 2020-01-31
  Administered 2020-01-31: 40 mg via INTRAVENOUS
  Filled 2020-01-31: qty 4

## 2020-01-31 MED ORDER — ATORVASTATIN 10 MG TABLET
10.00 mg | ORAL_TABLET | Freq: Every evening | ORAL | Status: DC
Start: 2020-01-31 — End: 2020-02-06
  Administered 2020-01-31 – 2020-02-05 (×6): 10 mg via ORAL
  Filled 2020-01-31 (×6): qty 1

## 2020-01-31 MED ORDER — APIXABAN 5 MG TABLET
2.50 mg | ORAL_TABLET | Freq: Two times a day (BID) | ORAL | Status: DC
Start: 2020-01-31 — End: 2020-02-06
  Administered 2020-01-31 – 2020-02-06 (×12): 2.5 mg via ORAL
  Filled 2020-01-31 (×12): qty 1

## 2020-01-31 MED ORDER — MORPHINE 4 MG/ML INJECTION WRAPPER
4.00 mg | INJECTION | INTRAMUSCULAR | Status: AC
Start: 2020-01-31 — End: 2020-01-31
  Administered 2020-01-31: 4 mg via INTRAVENOUS
  Filled 2020-01-31: qty 1

## 2020-01-31 MED ORDER — MAGNESIUM HYDROXIDE 400 MG/5 ML ORAL SUSPENSION
15.0000 mL | Freq: Every day | ORAL | Status: DC | PRN
Start: 2020-01-31 — End: 2020-02-06

## 2020-01-31 MED ORDER — LOSARTAN 50 MG TABLET
50.0000 mg | ORAL_TABLET | Freq: Every day | ORAL | Status: DC
Start: 2020-02-01 — End: 2020-02-06
  Administered 2020-02-01 – 2020-02-06 (×6): 50 mg via ORAL
  Filled 2020-01-31 (×6): qty 1

## 2020-01-31 MED ORDER — PANTOPRAZOLE 40 MG TABLET,DELAYED RELEASE
40.00 mg | DELAYED_RELEASE_TABLET | Freq: Every day | ORAL | Status: DC
Start: 2020-02-01 — End: 2020-02-06
  Administered 2020-02-01 – 2020-02-06 (×6): 40 mg via ORAL
  Filled 2020-01-31 (×6): qty 1

## 2020-01-31 MED ORDER — METHYLPREDNISOLONE 4 MG TABLET
4.00 mg | ORAL_TABLET | Freq: Every morning | ORAL | Status: DC
Start: 2020-02-01 — End: 2020-02-01

## 2020-01-31 MED ORDER — SODIUM CHLORIDE 0.9 % (FLUSH) INJECTION SYRINGE
10.0000 mL | INJECTION | Freq: Three times a day (TID) | INTRAMUSCULAR | Status: DC
Start: 2020-01-31 — End: 2020-02-06
  Administered 2020-01-31 – 2020-02-01 (×2): 0 mL
  Administered 2020-02-01: 10 mL
  Administered 2020-02-01 – 2020-02-02 (×2): 0 mL
  Administered 2020-02-02 – 2020-02-03 (×4): 10 mL
  Administered 2020-02-03: 06:00:00 0 mL
  Administered 2020-02-03 – 2020-02-04 (×4): 10 mL
  Administered 2020-02-05 (×3): 0 mL
  Administered 2020-02-06 (×2): 10 mL

## 2020-01-31 MED ORDER — ALBUTEROL SULFATE HFA 90 MCG/ACTUATION AEROSOL INHALER
2.00 | INHALATION_SPRAY | RESPIRATORY_TRACT | Status: AC
Start: 2020-01-31 — End: 2020-01-31
  Administered 2020-01-31: 2 via RESPIRATORY_TRACT
  Filled 2020-01-31: qty 8

## 2020-01-31 MED ORDER — INSULIN LISPRO 100 UNIT/ML SUB-Q SSIP - RMH
0.00 [IU] | INJECTION | Freq: Four times a day (QID) | SUBCUTANEOUS | Status: DC | PRN
Start: 2020-01-31 — End: 2020-02-06
  Administered 2020-01-31: 9 [IU] via SUBCUTANEOUS
  Administered 2020-02-01 (×3): 6 [IU] via SUBCUTANEOUS
  Administered 2020-02-02 – 2020-02-05 (×10): 9 [IU] via SUBCUTANEOUS
  Administered 2020-02-05: 6 [IU] via SUBCUTANEOUS
  Administered 2020-02-05 (×2): 9 [IU] via SUBCUTANEOUS
  Administered 2020-02-06: 6 [IU] via SUBCUTANEOUS
  Administered 2020-02-06: 3 [IU] via SUBCUTANEOUS
  Filled 2020-01-31: qty 9
  Filled 2020-01-31: qty 3
  Filled 2020-01-31 (×2): qty 9
  Filled 2020-01-31: qty 6
  Filled 2020-01-31 (×7): qty 9
  Filled 2020-01-31 (×3): qty 6
  Filled 2020-01-31: qty 12
  Filled 2020-01-31: qty 9
  Filled 2020-01-31: qty 6
  Filled 2020-01-31 (×2): qty 9

## 2020-01-31 MED ORDER — ASPIRIN 81 MG TABLET,DELAYED RELEASE
81.0000 mg | DELAYED_RELEASE_TABLET | Freq: Every day | ORAL | Status: DC
Start: 2020-02-01 — End: 2020-02-06
  Administered 2020-02-01 – 2020-02-06 (×6): 81 mg via ORAL
  Filled 2020-01-31 (×6): qty 1

## 2020-01-31 MED ORDER — SODIUM CHLORIDE 0.9 % (FLUSH) INJECTION SYRINGE
10.0000 mL | INJECTION | INTRAMUSCULAR | Status: DC | PRN
Start: 2020-01-31 — End: 2020-02-06

## 2020-01-31 NOTE — H&P (Signed)
[facility]  Hospitalist  History & Physical    Date of Service:  01/31/2020  [name], [name], 74 y.o. female  Date of Admission:  01/31/2020  Date of Birth:  [birth year]  PCP: Myrene Buddy, MD    Chief Complaint:  Shortness of breath.     HPI:    [name] is a 74 y.o. White female who is admitted for acute exacerbation of CHF.  Patient presented to the emergency room with increasing shortness of breath.  The patient states that she was released from [facility] for urinary tract infection and has been short of breath since that time.  Apparently she called EMS and her O2 sat was noted to be 87% on room air upon arrival.  The patient has had increased swelling of her lower extremities since being released from [facility].  Patient denies any altered mental status, vomiting, nausea, or chest pain.  Patient denies any fevers or chills.  Patient denies exposure to COVID-19.  The patient also denies a previous history of congestive heart failure.  Workup in emergency room showed a troponin of 248. ProBNP was elevated at 1420. D-dimer was elevated and a CT angio of the chest showed acute congestive heart failure.  The patient be admitted to the hospital for treatment of her acute exacerbation of CHF.    Past Medical History:   Diagnosis Date   . Abdominal hernia 10/18/2017    hx of repair   . Anxiety    . Arthritis    . Asthma    . Atrial fibrillation (CMS HCC)    . Back problem    . Bruises easily    . Cancer (CMS [place]) 10/18/2017    chemo and radiation completed 09/24/2017   . COPD (chronic obstructive pulmonary disease) (CMS HCC)    . CPAP (continuous positive airway pressure) dependence 10/18/2017    has not used recently   . Depression    . DM (diabetes mellitus) (CMS HCC) 2000    Fasting BG 200's. HGA1C 2018 6   . Edema    . Essential hypertension    . GERD (gastroesophageal reflux disease)    . Headache    . Heartburn    . Hx antineoplastic chemo 2018   . Hx of  radiation therapy 2018   . Hypercholesteremia    . Hyperlipidemia    . Migraine 10/18/2017    none recently   . Obesity    . Palpitations    . Pancreatic cancer (CMS [facility]) 03/2017   . Panic attack    . PE (pulmonary thromboembolism) (CMS HCC) 03/29/2018   . Peripheral neuropathy 10/18/2017    knees down, bilateral   . Sleep apnea    . Type 2 diabetes mellitus (CMS HCC) 10/18/2017    Dx 2000 FBS 200s   . Unintentional weight loss 10/18/2017    25 lbs 03/2017   . UTI (urinary tract infection)    . Wears glasses       Past Surgical History:   Procedure Laterality Date   . CESAREAN SECTION     . COLONOSCOPY  10/04/2019    Diverticuli a colonoscopy by Dr. Tonye Becket   . HX CESAREAN SECTION     . HX CHOLECYSTECTOMY     . HX HERNIA REPAIR     . HX SUBCLAVIAN PORT IMPLANTION      s/p removed   . HX SUBCLAVIAN PORT IMPLANTION     . HX  TONSIL AND ADENOIDECTOMY     . HX TONSILLECTOMY     . PANCREATICODUODENECTOMY  2018   . UMBILICAL HERNIA REPAIR      x2   . WHIPPLE PROCEDURE W/ LAPAROSCOPY        Social History     Tobacco Use   . Smoking status: Former Smoker     Packs/day: 0.50     Years: 20.00     Pack years: 10.00     Types: Cigarettes   . Smokeless tobacco: Never Used   . Tobacco comment: unsure of quit date   Vaping Use   . Vaping Use: Never used   Substance Use Topics   . Alcohol use: No   . Drug use: No       Family Medical History:     Problem Relation (Age of Onset)    Diabetes Mother    Heart Disease Mother    Respiratory Problems Father         Medications Prior to Admission     Prescriptions    Acetaminophen-Butalbital 50-325 mg Oral Tablet    Take by mouth    amoxicillin-pot clavulanate (AUGMENTIN) 875-125 mg Oral Tablet    Take 1 Tablet by mouth Twice daily    Patient not taking:  Reported on 01/31/2020    aspirin (ECOTRIN) 81 mg Oral Tablet, Delayed Release (E.C.)    Take 1 Tab (81 mg total) by mouth Once a day    atorvastatin (LIPITOR) 10 mg Oral Tablet    TAKE 1 TABLET BY MOUTH ONCE DAILY    cetirizine (ZYRTEC)  10 mg Oral Tablet    Take 1 Tab (10 mg total) by mouth Once a day    dilTIAZem (CARDIZEM) 30 mg Oral Tablet    Take 30 mg by mouth Four times a day    ELIQUIS 5 mg Oral Tablet    TAKE 1/2 TABLET BY MOUTH TWICE DAILY    furosemide (LASIX) 20 mg Oral Tablet    Take 2 Tabs (40 mg total) by mouth Once a day    hydrALAZINE (APRESOLINE) 10 mg Oral Tablet    Take 1 Tab (10 mg total) by mouth Every 8 hours    HYDROcodone-acetaminophen (NORCO) 5-325 mg Oral Tablet    Take 1 Tab by mouth Every 4 hours as needed for up to 30 days    insulin aspart U-100 (NOVOLOG FLEXPEN U-100 INSULIN) 100 unit/mL (3 mL) Subcutaneous Insulin Pen    7 Units by Subcutaneous route Twice a day before meals    insulin glargine (LANTUS SOLOSTAR U-100 INSULIN) 100 unit/mL Subcutaneous Insulin Pen    10 Units by Subcutaneous route Every night    losartan (COZAAR) 50 mg Oral Tablet    Every one hour    magnesium oxide (MAG-OX) 400 mg Oral Tablet    Take 400 mg by mouth Twice daily    methylPREDNISolone (MEDROL) 4 mg Oral Tablet    Take 4 mg by mouth Once a day    Patient not taking:  Reported on 01/31/2020    metoprolol succinate (TOPROL-XL) 25 mg Oral Tablet Sustained Release 24 hr    Take 1 Tab (25 mg total) by mouth Once a day    NANO PEN NEEDLE 32 gauge x 5/32" Needle    USE WITH INSULIN FLEXPEN BID AS NEEDED    omeprazole (PRILOSEC) 40 mg Oral Capsule, Delayed Release(E.C.)    Take 1 Cap (40 mg total) by mouth Once a day  ondansetron (ZOFRAN ODT) 4 mg Oral Tablet, Rapid Dissolve    Every 6 hours    pancreatic enzyme replacement (CREON) 12,000-38,000 -60,000 unit Oral Capsule, Delayed Release(E.C.)    Take by mouth Three times daily with meals 2 CAPS WITH MEALS AND 1 CAP WITH EACH SNACK         Allergies   Allergen Reactions   . Sulfa (Sulfonamides) Itching        Review of Systems   Constitutional: Positive for malaise/fatigue. Negative for chills and fever.   HENT: Negative for congestion, ear pain, sore throat and tinnitus.    Eyes: Negative for  blurred vision, photophobia and pain.   Respiratory: Positive for shortness of breath. Negative for cough, hemoptysis, sputum production and wheezing.    Cardiovascular: Positive for leg swelling. Negative for chest pain, palpitations, orthopnea, claudication and PND.   Gastrointestinal: Negative for abdominal pain, diarrhea, heartburn, nausea and vomiting.   Genitourinary: Negative for dysuria, frequency and urgency.   Musculoskeletal: Negative for back pain, joint pain and myalgias.   Skin: Negative for rash.   Neurological: Negative for dizziness, sensory change, speech change, focal weakness, weakness and headaches.   Endo/Heme/Allergies: Negative for environmental allergies. Does not bruise/bleed easily.   Psychiatric/Behavioral: Negative for depression and substance abuse. The patient is not nervous/anxious.           Filed Vitals:    01/31/20 1700 01/31/20 1727 01/31/20 1900 01/31/20 2000   BP: (!) 163/58   (!) 161/56   Pulse: 87   87   Resp: 16   18   Temp: 37.3 C (99.1 F)   36.2 C (97.2 F)   SpO2:  100% 98%        Physical Exam  HENT:      Head: Normocephalic and atraumatic.   Eyes:      Conjunctiva/sclera: Conjunctivae normal.      Pupils: Pupils are equal, round, and reactive to light.   Cardiovascular:      Rate and Rhythm: Normal rate and regular rhythm.      Heart sounds: Normal heart sounds. No murmur. No friction rub. No gallop.    Pulmonary:      Effort: No respiratory distress.      Breath sounds: Rales present. No wheezing.   Abdominal:      General: Bowel sounds are normal. There is no distension.      Palpations: Abdomen is soft. There is no mass.      Tenderness: There is no abdominal tenderness. There is no guarding or rebound.   Musculoskeletal:         General: No deformity. Normal range of motion.      Cervical back: Normal range of motion and neck supple.      Right lower leg: 3+ Pitting Edema present.      Left lower leg: 3+ Pitting Edema present.   Skin:     General: Skin is warm and  dry.      Findings: No erythema or rash.   Neurological:      Mental Status: She is alert and oriented to person, place, and time.      Cranial Nerves: No cranial nerve deficit.   Psychiatric:         Mood and Affect: Affect normal.         Cognition and Memory: Memory normal.         Laboratory Data:     Results for orders placed or performed during  the hospital encounter of 01/31/20 (from the past 24 hour(s))   B-TYPE NATRIURETIC PEPTIDE   Result Value Ref Range    BNP 1,420 (H) <=99 pg/mL   D-DIMER   Result Value Ref Range    D-DIMER 2.01 (H) 0.00 - 0.59 mg/L   CBC WITH DIFF   Result Value Ref Range    WBC 8.5 3.7 - 11.0 x10^3/uL    RBC 2.70 (L) 3.85 - 5.22 x10^6/uL    HGB 8.7 (L) 11.5 - 16.0 g/dL    HCT 27.2 (L) 34.8 - 46.0 %    MCV 100.7 (H) 78.0 - 100.0 fL    MCH 32.2 (H) 26.0 - 32.0 pg    MCHC 32.0 31.0 - 35.5 g/dL    RDW-CV 19.9 (H) 11.5 - 15.5 %    PLATELETS 136 (L) 150 - 400 x10^3/uL    MPV 11.8 8.7 - 12.5 fL    NEUTROPHIL % 79 %    LYMPHOCYTE % 7 %    MONOCYTE % 12 %    EOSINOPHIL % 1 %    BASOPHIL % 0 %    NEUTROPHIL # 6.74 1.50 - 7.70 x10^3/uL    LYMPHOCYTE # 0.56 (L) 1.00 - 4.80 x10^3/uL    MONOCYTE # 1.00 0.20 - 1.10 x10^3/uL    EOSINOPHIL # 0.12 <=0.50 x10^3/uL    BASOPHIL # <0.10 <=0.20 x10^3/uL    IMMATURE GRANULOCYTE % 1 0 - 1 %    IMMATURE GRANULOCYTE # <0.10 <0.10 x10^3/uL   ARTERIAL BLOOD GAS   Result Value Ref Range    PH (ARTERIAL) 7.48 (H) 7.35 - 7.45    PCO2 (ARTERIAL) 35.0 35.0 - 45.0 mm/Hg    PO2 (ARTERIAL) 61.4 (L) 80.0 - 100.0 mm/Hg    BICARBONATE (ARTERIAL) 25.7 24.0 - 26.0 mmol/L    BASE EXCESS (ARTERIAL) 2.3 (H) -2.0 - 2.0 mmol/L    O2 SATURATION (ARTERIAL) 91.5 88.0 - 100.0 %    %FIO2 (ARTERIAL) 24 %    ALLEN TEST pos     DRAW SITE rr     RESPIRATORY RATE 18    COMPREHENSIVE METABOLIC PANEL, NON-FASTING   Result Value Ref Range    SODIUM 132 (L) 136 - 145 mmol/L    POTASSIUM 4.0 3.5 - 5.1 mmol/L    CHLORIDE 99 96 - 111 mmol/L    CO2 TOTAL 25 23 - 31 mmol/L    ANION GAP 8 4 - 13  mmol/L    BUN 26 (H) 8 - 25 mg/dL    CREATININE 0.90 0.60 - 1.05 mg/dL    BUN/CREA RATIO 29 (H) 6 - 22    ESTIMATED GFR 65 >=60 mL/min/BSA    ALBUMIN 1.8 (L) 3.4 - 4.8 g/dL     CALCIUM 7.4 (L) 8.8 - 10.2 mg/dL    GLUCOSE 186 (H) 65 - 125 mg/dL    ALKALINE PHOSPHATASE 88 55 - 145 U/L    ALT (SGPT) 9 8 - 22 U/L    AST (SGOT)  18 8 - 45 U/L    BILIRUBIN TOTAL 0.3 0.3 - 1.3 mg/dL    PROTEIN TOTAL 4.3 (L) 6.0 - 8.0 g/dL   TROPONIN-I   Result Value Ref Range    TROPONIN I 205 (H) 7 - 30 ng/L   RED TOP TUBE   Result Value Ref Range    RAINBOW/EXTRA TUBE AUTO RESULT Yes    GRAY TOP TUBE   Result Value Ref Range    RAINBOW/EXTRA TUBE AUTO RESULT  Yes    URINALYSIS, MACROSCOPIC   Result Value Ref Range    COLOR Yellow Yellow, Straw    APPEARANCE Clear Clear, Slightly Hazy    SPECIFIC GRAVITY 1.020 1.005 - 1.030    PH 6.0 5.0 - 8.5    LEUKOCYTES Negative Negative WBCs/uL    NITRITE Negative Negative    PROTEIN 100  (A) Negative mg/dL    GLUCOSE Negative Negative mg/dL    KETONES Negative Negative mg/dL    UROBILINOGEN 0.2  0.2 mg/dL    BILIRUBIN Negative Negative mg/dL    BLOOD Small (A) Negative mg/dL   URINALYSIS, MICROSCOPIC   Result Value Ref Range    RBCS 3-5 None, Occasional, 0-2, 3-5 /hpf    WBCS 0-2 None, Occasional, 0-2, 3-5 /hpf    BACTERIA Rare (A) None /hpf    SQUAMOUS EPITHELIAL 3-5 None, Occasional, 0-2, 3-5 /hpf   POC BLOOD GLUCOSE (RESULTS)   Result Value Ref Range    GLUCOSE, POC 293 (H) 60 - 110 mg/dl       Imaging Studies:    CT ANGIO CHEST W IV CONTRAST   Final Result by Edi, Radresults In (02/19 1556)   Negative for pulmonary embolism. Negative for aortic   dissection.      Acute congestive heart failure. Interstitial edema and bilateral pleural   effusions.      Questionable anasarca. Appear to be some edematous type findings in the   soft tissues surrounding the chest.         The CT exam was performed using one or more the following a dose reduction   techniques: Automated exposure control, adjustment of  the mA and/or kV   according to the patient's size, or use of iterative reconstruction   technique.      This note was partially created using voice recognition software and is   inherently subject to errors including those of syntax and "sound alike "   substitutions which may escape proof reading.  In such instances, original   meaning may be extrapolated by contextual derivation.         Radiologist location ID: [id number]         XR AP MOBILE CHEST   Final Result by Edi, Radresults In (02/19 1537)   Mild interstitial edema. Possibly a cardiac etiology, but,   lymphangitic metastatic disease could also look like this. Clinical   correlation is advised.      This note was partially created using voice recognition software and is   inherently subject to errors including those of syntax and "sound alike "   substitutions which may escape proof reading.  In such instances, original   meaning may be extrapolated by contextual derivation.         Radiologist location ID: [id number]             Assessment/Plan:  Active Hospital Problems    Diagnosis   . Acute exacerbation of CHF (congestive heart failure) (CMS HCC)   . CHF (congestive heart failure) (CMS HCC)   . COPD (chronic obstructive pulmonary disease) (CMS HCC)   . Hyperlipidemia   . Type 2 diabetes mellitus (CMS HCC)       . Acute exacerbation of CHF.  Despite reporting no history of CHF, the patient was diagnosed with CHF by her PCP approximately 4 months ago.  She had an echocardiogram done approximately 5 days ago that show an EF of 60% with mild diastolic dysfunction.  Will admit the  patient to med surge.  The patient will be started on IV Lasix.  Will continue to monitor urine output.  Repeat lab work and chest x-ray in the morning.  . COPD.  Chronic.  Continue medications.  . Hyperlipidemia.  Stable.  . Type 2 diabetes.  Low-carbohydrate diabetic diet.  Sliding scale insulin.  Continue home medications.  . DVT prophylaxis.  Patient is on Eliquis.    Bethanie Dicker, MD

## 2020-01-31 NOTE — Nurses Notes (Addendum)
Patient arrive to floor via ER stretcher. Oriented to room and call bell. Vital signs taken. O2 2l nc in place. X2 side rails up and call bell within reach. Telemetry applied and activated.

## 2020-01-31 NOTE — Nurses Notes (Signed)
Called and spoke with daughter in law, [name] who helps with her medications. She has taken lasix twice today. Spoke with provider and was instructed not to administer the iv lasix at this time.

## 2020-01-31 NOTE — ED Nurses Note (Signed)
Report to Medsurg, pt going to room 1136.

## 2020-01-31 NOTE — ED Provider Notes (Signed)
[facility]  Emergency Department  Provider Note      [name]  September 05, 1946  74 y.o.  female  [name] [id number]   [phone] (home)  Myrene Buddy, MD    Chief Complaint:   Chief Complaint   Patient presents with   . Shortness of Breath       HPI: This is a 74 y.o. female who presents to the emergency department complaining of shortness of breath.  She says she was released 9 days ago from [facility] for UTI she has been short of breath since.  Per EMS she was 87% on room air upon arrival.  She says that she had swelling of her lower extremities [facility] but that was not assessed.  She says she is more swollen and more short of breath.  She says she is wheezing at home but denies any cough denies any fevers or chills.  She also complains of a mild frontal headache.  She denies any altered mental status nausea vomiting visual changes or focal neuro deficit.  She denies any chest pain.  Denies any COVID-19 exposure.        Past Medical History:   Past Medical History:   Diagnosis Date   . Abdominal hernia 10/18/2017    hx of repair   . Anxiety    . Arthritis    . Asthma    . Atrial fibrillation (CMS HCC)    . Back problem    . Bruises easily    . Cancer (CMS [place]) 10/18/2017    chemo and radiation completed 09/24/2017   . COPD (chronic obstructive pulmonary disease) (CMS HCC)    . CPAP (continuous positive airway pressure) dependence 10/18/2017    has not used recently   . Depression    . DM (diabetes mellitus) (CMS HCC) 2000    Fasting BG 200's. HGA1C 2018 6   . Edema    . Essential hypertension    . GERD (gastroesophageal reflux disease)    . Headache    . Heartburn    . Hx antineoplastic chemo 2018   . Hx of radiation therapy 2018   . Hypercholesteremia    . Hyperlipidemia    . Migraine 10/18/2017    none recently   . Obesity    . Palpitations    . Pancreatic cancer (CMS [place]) 03/2017   . Panic attack    . PE (pulmonary thromboembolism) (CMS HCC) 03/29/2018   .  Peripheral neuropathy 10/18/2017    knees down, bilateral   . Sleep apnea    . Type 2 diabetes mellitus (CMS HCC) 10/18/2017    Dx 2000 FBS 200s   . Unintentional weight loss 10/18/2017    25 lbs 03/2017   . UTI (urinary tract infection)    . Wears glasses        Past Surgical History:   Past Surgical History:   Procedure Laterality Date   . Cesarean section     . Colonoscopy  10/04/2019   . Hx cesarean section     . Hx cholecystectomy     . Hx hernia repair     . Hx subclavian port implantion     . Hx subclavian port implantion     . Hx tonsil and adenoidectomy     . Hx tonsillectomy     . Pancreaticoduodenectomy  2018   . Umbilical hernia repair     . Whipple procedure w/ laparoscopy  Social History:   Social History     Tobacco Use   . Smoking status: Former Smoker     Packs/day: 0.50     Years: 20.00     Pack years: 10.00     Types: Cigarettes   . Smokeless tobacco: Never Used   . Tobacco comment: unsure of quit date   Vaping Use   . Vaping Use: Never used   Substance Use Topics   . Alcohol use: No   . Drug use: No      Social History     Substance and Sexual Activity   Drug Use No       Allergies:   Allergies   Allergen Reactions   . Sulfa (Sulfonamides) Itching       Medications: (Not in an outpatient encounter)         Review of Systems   Constitutional: Negative for chills, fever and malaise/fatigue.        Review of systems as below.  Additional systems reviewed in HPI.     HENT: Negative for congestion, sinus pain, sore throat and tinnitus.    Eyes: Negative for blurred vision, photophobia, pain and redness.   Respiratory: Negative for cough, hemoptysis, shortness of breath and wheezing.    Cardiovascular: Negative for chest pain, palpitations, orthopnea, leg swelling and PND.   Gastrointestinal: Negative for abdominal pain, blood in stool, diarrhea, heartburn, nausea and vomiting.   Genitourinary: Negative for dysuria, frequency, hematuria and urgency.   Musculoskeletal: Negative for back pain,  joint pain, myalgias and neck pain.   Skin: Negative for rash.   Neurological: Negative for dizziness, sensory change, speech change, focal weakness, weakness and headaches.   Endo/Heme/Allergies: Negative for environmental allergies. Does not bruise/bleed easily.   Psychiatric/Behavioral: Negative for depression, hallucinations, memory loss, substance abuse and suicidal ideas.         ED Triage Vitals [01/31/20 1335]   BP (Non-Invasive) (!) 152/77   Heart Rate 94   Respiratory Rate (!) 22   Temperature 36.2 C (97.2 F)   SpO2 98 %   Weight 64 kg (141 lb)   Height 1.549 m (5\' 1" )       Physical Exam  Constitutional:       Comments: Chronically ill-appearing female sitting upright in bed.  She appears uncomfortable but does not appear toxic   HENT:      Head: Normocephalic and atraumatic.      Right Ear: External ear normal.      Left Ear: External ear normal.      Nose: Nose normal.   Eyes:      Pupils: Pupils are equal, round, and reactive to light.   Cardiovascular:      Rate and Rhythm: Normal rate and regular rhythm.      Heart sounds: Normal heart sounds.   Pulmonary:      Comments: Bilateral wheeze and rales  Abdominal:      General: Bowel sounds are normal. There is no distension.      Palpations: Abdomen is soft.      Tenderness: There is no abdominal tenderness.   Musculoskeletal:         General: No tenderness or deformity. Normal range of motion.      Cervical back: Normal range of motion and neck supple.      Comments: Three to 4+ pitting edema bilateral lower extremities.   Skin:     General: Skin is warm and dry.  Findings: No rash.   Neurological:      Mental Status: She is alert and oriented to person, place, and time.      Cranial Nerves: No cranial nerve deficit.      Deep Tendon Reflexes: Reflexes are normal and symmetric.   Psychiatric:         Mood and Affect: Affect normal.         Cognition and Memory: Memory normal.         Judgment: Judgment normal.           Labs:   Results for orders  placed or performed during the hospital encounter of 01/31/20 (from the past 12 hour(s))   B-TYPE NATRIURETIC PEPTIDE   Result Value Ref Range    BNP 1,420 (H) <=99 pg/mL   D-DIMER   Result Value Ref Range    D-DIMER 2.01 (H) 0.00 - 0.59 mg/L   CBC WITH DIFF   Result Value Ref Range    WBC 8.5 3.7 - 11.0 x10^3/uL    RBC 2.70 (L) 3.85 - 5.22 x10^6/uL    HGB 8.7 (L) 11.5 - 16.0 g/dL    HCT 27.2 (L) 34.8 - 46.0 %    MCV 100.7 (H) 78.0 - 100.0 fL    MCH 32.2 (H) 26.0 - 32.0 pg    MCHC 32.0 31.0 - 35.5 g/dL    RDW-CV 19.9 (H) 11.5 - 15.5 %    PLATELETS 136 (L) 150 - 400 x10^3/uL    MPV 11.8 8.7 - 12.5 fL    NEUTROPHIL % 79 %    LYMPHOCYTE % 7 %    MONOCYTE % 12 %    EOSINOPHIL % 1 %    BASOPHIL % 0 %    NEUTROPHIL # 6.74 1.50 - 7.70 x10^3/uL    LYMPHOCYTE # 0.56 (L) 1.00 - 4.80 x10^3/uL    MONOCYTE # 1.00 0.20 - 1.10 x10^3/uL    EOSINOPHIL # 0.12 <=0.50 x10^3/uL    BASOPHIL # <0.10 <=0.20 x10^3/uL    IMMATURE GRANULOCYTE % 1 0 - 1 %    IMMATURE GRANULOCYTE # <0.10 <0.10 x10^3/uL   ARTERIAL BLOOD GAS   Result Value Ref Range    PH (ARTERIAL) 7.48 (H) 7.35 - 7.45    PCO2 (ARTERIAL) 35.0 35.0 - 45.0 mm/Hg    PO2 (ARTERIAL) 61.4 (L) 80.0 - 100.0 mm/Hg    BICARBONATE (ARTERIAL) 25.7 24.0 - 26.0 mmol/L    BASE EXCESS (ARTERIAL) 2.3 (H) -2.0 - 2.0 mmol/L    O2 SATURATION (ARTERIAL) 91.5 88.0 - 100.0 %    %FIO2 (ARTERIAL) 24 %    ALLEN TEST pos     DRAW SITE rr     RESPIRATORY RATE 18    COMPREHENSIVE METABOLIC PANEL, NON-FASTING   Result Value Ref Range    SODIUM 132 (L) 136 - 145 mmol/L    POTASSIUM 4.0 3.5 - 5.1 mmol/L    CHLORIDE 99 96 - 111 mmol/L    CO2 TOTAL 25 23 - 31 mmol/L    ANION GAP 8 4 - 13 mmol/L    BUN 26 (H) 8 - 25 mg/dL    CREATININE 0.90 0.60 - 1.05 mg/dL    BUN/CREA RATIO 29 (H) 6 - 22    ESTIMATED GFR 65 >=60 mL/min/BSA    ALBUMIN 1.8 (L) 3.4 - 4.8 g/dL     CALCIUM 7.4 (L) 8.8 - 10.2 mg/dL    GLUCOSE 186 (H) 65 - 125 mg/dL  ALKALINE PHOSPHATASE 88 55 - 145 U/L    ALT (SGPT) 9 8 - 22 U/L    AST (SGOT)  18 8  - 45 U/L    BILIRUBIN TOTAL 0.3 0.3 - 1.3 mg/dL    PROTEIN TOTAL 4.3 (L) 6.0 - 8.0 g/dL   TROPONIN-I   Result Value Ref Range    TROPONIN I 205 (H) 7 - 30 ng/L   URINALYSIS, MACROSCOPIC   Result Value Ref Range    COLOR Yellow Yellow, Straw    APPEARANCE Clear Clear, Slightly Hazy    SPECIFIC GRAVITY 1.020 1.005 - 1.030    PH 6.0 5.0 - 8.5    LEUKOCYTES Negative Negative WBCs/uL    NITRITE Negative Negative    PROTEIN 100  (A) Negative mg/dL    GLUCOSE Negative Negative mg/dL    KETONES Negative Negative mg/dL    UROBILINOGEN 0.2  0.2 mg/dL    BILIRUBIN Negative Negative mg/dL    BLOOD Small (A) Negative mg/dL   URINALYSIS, MICROSCOPIC   Result Value Ref Range    RBCS 3-5 None, Occasional, 0-2, 3-5 /hpf    WBCS 0-2 None, Occasional, 0-2, 3-5 /hpf    BACTERIA Rare (A) None /hpf    SQUAMOUS EPITHELIAL 3-5 None, Occasional, 0-2, 3-5 /hpf       I have reviewed all labs ordered.  See course.    Imaging:  CT ANGIO CHEST W IV CONTRAST   Final Result by Edi, Radresults In (02/19 1556)   Negative for pulmonary embolism. Negative for aortic   dissection.      Acute congestive heart failure. Interstitial edema and bilateral pleural   effusions.      Questionable anasarca. Appear to be some edematous type findings in the   soft tissues surrounding the chest.         The CT exam was performed using one or more the following a dose reduction   techniques: Automated exposure control, adjustment of the mA and/or kV   according to the patient's size, or use of iterative reconstruction   technique.      This note was partially created using voice recognition software and is   inherently subject to errors including those of syntax and "sound alike "   substitutions which may escape proof reading.  In such instances, original   meaning may be extrapolated by contextual derivation.         Radiologist location ID: [id number]         XR AP MOBILE CHEST   Final Result by Edi, Radresults In (02/19 1537)   Mild interstitial edema. Possibly a  cardiac etiology, but,   lymphangitic metastatic disease could also look like this. Clinical   correlation is advised.      This note was partially created using voice recognition software and is   inherently subject to errors including those of syntax and "sound alike "   substitutions which may escape proof reading.  In such instances, original   meaning may be extrapolated by contextual derivation.         Radiologist location ID: [id number]             I have seen and reviewed all radiology images ordered. See course.    ED Course/ MDM/ Plan:   Patient was triaged, vital signs were obtained, patient was  placed in a room.  On exam, patient is alert and oriented, nontoxic on exam, and in no acute distress. Vitals were reviewed. Work-up  ordered.  Patient was given Lasix 40 mg IV along with some morphine.  Foley catheter placed.  Patient's workup reveals congestive heart failure.  Patient will be admitted to the hospitalist service.    ED Course as of Jan 31 1600   Fri Jan 31, 2020   1353 Sinus rhythm with a rate 94 P are 154 QRS 86 QT of 372 QTC of 465 no acute ST segment elevation depression or T-wave inversion.   ECG 12-LEAD [JB]   1557 Congestive heart failure   CT ANGIO CHEST W IV CONTRAST [JB]      ED Course User Index  [JB] Baughman, John M, PA-C       Medications   NS bolus infusion 50 mL (has no administration in time range)   furosemide (LASIX) 10 mg/mL injection (40 mg Intravenous Given 01/31/20 1428)   albuterol 90 mcg per inhalation oral inhaler - "Respiratory to administer" (2 Puffs Inhalation Given 01/31/20 1358)   morphine 4 mg/mL injection (4 mg Intravenous Given 01/31/20 1430)   aerochamber w/ flowsignal spacer inhalational device ( Inhalation Given 01/31/20 1358)   iopamidol (ISOVUE-370) 76% infusion (100 mL Intravenous Given 01/31/20 1526)        Procedures  None        Clinical Impression:   Encounter Diagnoses   Name Primary?   . Congestive heart failure, unspecified HF chronicity, unspecified heart  failure type (CMS HCC) Yes   . Cephalgia            Disposition: Admitted  New Prescriptions    No medications on file      No follow-up provider specified.   BP (!) 153/47   Pulse 89   Temp 36.2 C (97.2 F)   Resp 18   Ht 1.549 m (5\' 1" )   Wt 64 kg (141 lb)   SpO2 98%   BMI 26.64 kg/m          John M Baughman, PA-C       This note was partially created using voice recognition software and is inherently subject to errors including those of syntax and "sound alike " substitutions which may escape proof reading.  In such instances, original meaning may be extrapolated by contextual derivation.      I did not personally see or evaluate this patient but was immediately available for consultation as deemed necessary by the midlevel provider.    Joaquim Lai, DO  01/31/2020, 16:01

## 2020-01-31 NOTE — ED Triage Notes (Signed)
Pt to the ED via EMS for shortness of breath. Pt was released from [facility] 9 days ago for a UTI. EMS states her Sp02 on arrival was 86% on room air. Pt given Duo neb and placed on 2L NC. Pt has bilateral lower leg edema.

## 2020-02-01 ENCOUNTER — Inpatient Hospital Stay (HOSPITAL_COMMUNITY): Payer: Medicare Other

## 2020-02-01 ENCOUNTER — Inpatient Hospital Stay (HOSPITAL_COMMUNITY)

## 2020-02-01 LAB — CBC WITH DIFF
BASOPHIL #: <0.1 10*3/uL (ref <=0.20)
BASOPHIL %: 0 %
EOSINOPHIL #: 0.23 10*3/uL (ref <=0.50)
EOSINOPHIL %: 3 %
HCT: 24.1 % — ABNORMAL LOW (ref 34.8–46.0)
HGB: 7.6 g/dL — ABNORMAL LOW (ref 11.5–16.0)
IMMATURE GRANULOCYTE #: <0.1 10*3/uL (ref <0.10)
IMMATURE GRANULOCYTE %: 1 % (ref 0–1)
LYMPHOCYTE #: 0.29 10*3/uL — ABNORMAL LOW (ref 1.00–4.80)
LYMPHOCYTE %: 4 %
MCH: 31.9 pg (ref 26.0–32.0)
MCHC: 31.5 g/dL (ref 31.0–35.5)
MCV: 101.3 fL — ABNORMAL HIGH (ref 78.0–100.0)
MONOCYTE #: 0.72 10*3/uL (ref 0.20–1.10)
MONOCYTE %: 10 %
MPV: 12.2 fL (ref 8.7–12.5)
NEUTROPHIL #: 6.21 10*3/uL (ref 1.50–7.70)
NEUTROPHIL %: 82 %
PLATELETS: 140 10*3/uL — ABNORMAL LOW (ref 150–400)
RBC: 2.38 10*6/uL — ABNORMAL LOW (ref 3.85–5.22)
RDW-CV: 19.5 % — ABNORMAL HIGH (ref 11.5–15.5)
WBC: 7.5 10*3/uL (ref 3.7–11.0)

## 2020-02-01 LAB — COMPREHENSIVE METABOLIC PANEL, NON-FASTING
ALBUMIN: 1.7 g/dL — ABNORMAL LOW (ref 3.4–4.8)
ALKALINE PHOSPHATASE: 73 U/L (ref 55–145)
ALT (SGPT): 9 U/L (ref 8–22)
ANION GAP: 6 mmol/L (ref 4–13)
AST (SGOT): 16 U/L (ref 8–45)
BILIRUBIN TOTAL: 0.3 mg/dL (ref 0.3–1.3)
BUN/CREA RATIO: 29 — ABNORMAL HIGH (ref 6–22)
BUN: 25 mg/dL (ref 8–25)
CALCIUM: 7.4 mg/dL — ABNORMAL LOW (ref 8.8–10.2)
CHLORIDE: 98 mmol/L (ref 96–111)
CO2 TOTAL: 26 mmol/L (ref 23–31)
CREATININE: 0.86 mg/dL (ref 0.60–1.05)
ESTIMATED GFR: 68 mL/min/BSA (ref >=60)
GLUCOSE: 115 mg/dL (ref 65–125)
POTASSIUM: 3.8 mmol/L (ref 3.5–5.1)
PROTEIN TOTAL: 4 g/dL — ABNORMAL LOW (ref 6.0–8.0)
SODIUM: 130 mmol/L — ABNORMAL LOW (ref 136–145)

## 2020-02-01 LAB — POC BLOOD GLUCOSE (RESULTS)
GLUCOSE, POC: 125 mg/dL — ABNORMAL HIGH (ref 60–110)
GLUCOSE, POC: 245 mg/dL — ABNORMAL HIGH (ref 60–110)
GLUCOSE, POC: 219 mg/dL — ABNORMAL HIGH (ref 60–110)
GLUCOSE, POC: 245 mg/dL — ABNORMAL HIGH (ref 60–110)

## 2020-02-01 LAB — TROPONIN-I
TROPONIN I: 133 ng/L — ABNORMAL HIGH (ref 7–30)
TROPONIN I: 105 ng/L — ABNORMAL HIGH (ref 7–30)

## 2020-02-01 LAB — B-TYPE NATRIURETIC PEPTIDE (BNP),PLASMA: BNP: 746 pg/mL — ABNORMAL HIGH (ref <=99)

## 2020-02-01 LAB — COVID-19 ~~LOC~~ MOLECULAR LAB TESTING: SARS-CoV-2: NEGATIVE

## 2020-02-01 MED ORDER — DOXYCYCLINE HYCLATE 100 MG INTRAVENOUS POWDER FOR SOLUTION
100.00 mg | Freq: Two times a day (BID) | INTRAVENOUS | Status: DC
Start: 2020-02-01 — End: 2020-02-06
  Administered 2020-02-01: 100 mg via INTRAVENOUS
  Administered 2020-02-01: 0 mg via INTRAVENOUS
  Administered 2020-02-01: 100 mg via INTRAVENOUS
  Administered 2020-02-01: 22:00:00 0 mg via INTRAVENOUS
  Administered 2020-02-02: 100 mg via INTRAVENOUS
  Administered 2020-02-02: 0 mg via INTRAVENOUS
  Administered 2020-02-02: 100 mg via INTRAVENOUS
  Administered 2020-02-02 – 2020-02-03 (×2): 0 mg via INTRAVENOUS
  Administered 2020-02-03 (×2): 100 mg via INTRAVENOUS
  Administered 2020-02-03: 12:00:00 0 mg via INTRAVENOUS
  Administered 2020-02-04 (×2): 100 mg via INTRAVENOUS
  Administered 2020-02-04 – 2020-02-05 (×4): 0 mg via INTRAVENOUS
  Administered 2020-02-05 (×2): 100 mg via INTRAVENOUS
  Administered 2020-02-06: 0 mg via INTRAVENOUS
  Administered 2020-02-06: 100 mg via INTRAVENOUS
  Filled 2020-02-01 (×11): qty 10

## 2020-02-01 MED ORDER — SODIUM CHLORIDE 0.9 % INTRAVENOUS PIGGYBACK
1.0000 g | INTRAVENOUS | Status: DC
Start: 2020-02-01 — End: 2020-02-06
  Administered 2020-02-01: 0 g via INTRAVENOUS
  Administered 2020-02-01: 1 g via INTRAVENOUS
  Administered 2020-02-02: 07:00:00 0 g via INTRAVENOUS
  Administered 2020-02-02: 1 g via INTRAVENOUS
  Administered 2020-02-03: 0 g via INTRAVENOUS
  Administered 2020-02-03: 1 g via INTRAVENOUS
  Administered 2020-02-04: 0 g via INTRAVENOUS
  Administered 2020-02-04: 07:00:00 1 g via INTRAVENOUS
  Administered 2020-02-05: 06:00:00 0 g via INTRAVENOUS
  Administered 2020-02-05: 1 g via INTRAVENOUS
  Administered 2020-02-06: 0 g via INTRAVENOUS
  Administered 2020-02-06: 1 g via INTRAVENOUS
  Filled 2020-02-01 (×6): qty 10

## 2020-02-01 MED ORDER — METHYLPREDNISOLONE SOD SUCC 125 MG SOLUTION FOR INJECTION WRAPPER
62.50 mg | Freq: Four times a day (QID) | INTRAVENOUS | Status: DC
Start: 2020-02-01 — End: 2020-02-06
  Administered 2020-02-01 – 2020-02-06 (×21): 62.5 mg via INTRAVENOUS
  Filled 2020-02-01 (×21): qty 2

## 2020-02-01 MED ORDER — LIPASE-PROTEASE-AMYLASE 12,000-38,000-60,000 UNIT CAPSULE,DELAYED REL
1.0000 | DELAYED_RELEASE_CAPSULE | Freq: Three times a day (TID) | ORAL | Status: DC
Start: 2020-02-01 — End: 2020-02-06
  Administered 2020-02-01 – 2020-02-06 (×17): 1 via ORAL

## 2020-02-01 NOTE — Respiratory Therapy (Signed)
Pt resting comfortable no distress noted will continue to monitor

## 2020-02-01 NOTE — Nurses Notes (Signed)
0400 temp 101.35F axillary. Prn tylenol given.   Temperature recheck 102.6F. Tele ST 100-104. Patient SOB without exertion RR 22.   Notified Dr. Purvis Kilts orders obtained for blood cultures x2 and IV rocephin 1 gram Q24 hrs.   No further orders at this time. Will continue to monitor patient. Ramond Craver, RN  02/01/2020, 05:50

## 2020-02-01 NOTE — Progress Notes (Signed)
[facility]  Hospitalist  Progress Note    Date of Service:  02/01/2020  [name], [name], 74 y.o. female  Date of Admission:  01/31/2020  Date of Birth:  May 25, 1946  PCP: Myrene Buddy, MD    HPI:    Patient admitted to the hospital with acute exacerbation of CHF.  The patient feels a little bit better today.  The patient developed a fever last night and blood cultures were obtained.  The patient has since been started on doxycycline in addition to her Rocephin.    acetaminophen (TYLENOL) tablet, 650 mg, Oral, Q4H PRN  aluminum-magnesium hydroxide-simethicone (MAG-AL PLUS) 200-200-20 mg per 5 mL oral liquid, 20 mL, Oral, Q4H PRN  apixaban (ELIQUIS) tablet, 2.5 mg, Oral, 2x/day  aspirin (ECOTRIN) enteric coated tablet 81 mg, 81 mg, Oral, Daily  atorvastatin (LIPITOR) tablet, 10 mg, Oral, QPM  cefTRIAXone (ROCEPHIN) 1 g in NS 100 mL IVPB, 1 g, Intravenous, Q24H  dextrose 50% (0.5 g/mL) injection - syringe, 12.5 g, Intravenous, Q15 Min PRN  dilTIAZem (CARDIZEM) tablet, 30 mg, Oral, 4x/day  doxycycline hyclate 100 mg in NS 100 mL IVPB, 100 mg, Intravenous, Q12H  furosemide (LASIX) 10 mg/mL injection, 40 mg, Intravenous, 2x/day  hydrALAZINE (APRESOLINE) tablet, 10 mg, Oral, Q8HRS  HYDROcodone-acetaminophen (NORCO) 5-325 mg per tablet, 1 Tablet, Oral, Q4H PRN  insulin glargine 100 units/mL injection, 10 Units, Subcutaneous, NIGHTLY  loratadine (CLARITIN) tablet, 10 mg, Oral, Daily  losartan (COZAAR) tablet, 50 mg, Oral, Daily  magnesium hydroxide (MILK OF MAGNESIA) 400mg  per 29mL oral liquid, 15 mL, Oral, Daily PRN  magnesium oxide (MAG-OX) tablet, 400 mg, Oral, 2x/day  methylPREDNISolone sod succ (SOLU-MEDROL) 125 mg/2 mL injection, 62.5 mg, Intravenous, Q6H  metoprolol succinate (TOPROL-XL) 24 hr extended release tablet, 25 mg, Oral, Daily  NS bolus infusion 50 mL, 50 mL, Intravenous, Give in Radiology  NS flush syringe, 10 mL, Intracatheter, Q8HRS  NS flush syringe, 10 mL, Intracatheter, Q1H  PRN  ondansetron (ZOFRAN) 2 mg/mL injection, 4 mg, Intravenous, Q6H PRN  pancreatic enzyme replacement (CREON) 12,000 units lipase per cap, 1 Capsule, Oral, 3x/day-Meals  pantoprazole (PROTONIX) delayed release tablet, 40 mg, Oral, Daily  SSIP insulin lispro (HUMALOG) 100 units/mL injection, 0-12 Units, Subcutaneous, 4x/day PRN         Allergies   Allergen Reactions   . Sulfa (Sulfonamides) Itching             Filed Vitals:    02/01/20 0732 02/01/20 0748 02/01/20 1103 02/01/20 1535   BP:  (!) 150/68 (!) 162/75 (!) 137/51   Pulse:  85 90 79   Resp:  16 16 16    Temp:  37.8 C (100.1 F) 37.2 C (98.9 F) 37.2 C (98.9 F)   SpO2: 98%          Physical Exam  HENT:      Head: Normocephalic and atraumatic.   Eyes:      Conjunctiva/sclera: Conjunctivae normal.      Pupils: Pupils are equal, round, and reactive to light.   Cardiovascular:      Rate and Rhythm: Normal rate and regular rhythm.      Heart sounds: Normal heart sounds. No murmur. No friction rub. No gallop.    Pulmonary:      Effort: No respiratory distress.      Breath sounds: Rales present. No wheezing.   Abdominal:      General: Bowel sounds are normal. There is no distension.      Palpations:  Abdomen is soft. There is no mass.      Tenderness: There is no abdominal tenderness. There is no guarding or rebound.   Musculoskeletal:         General: No deformity. Normal range of motion.      Cervical back: Normal range of motion and neck supple.      Right lower leg: 3+ Pitting Edema present.      Left lower leg: 3+ Pitting Edema present.   Skin:     General: Skin is warm and dry.      Findings: No erythema or rash.   Neurological:      Mental Status: She is alert and oriented to person, place, and time.      Cranial Nerves: No cranial nerve deficit.   Psychiatric:         Mood and Affect: Affect normal.         Cognition and Memory: Memory normal.         Laboratory Data:     Results for orders placed or performed during the hospital encounter of 01/31/20 (from  the past 24 hour(s))   POC BLOOD GLUCOSE (RESULTS)   Result Value Ref Range    GLUCOSE, POC 293 (H) 60 - 110 mg/dl   COMPREHENSIVE METABOLIC PANEL, NON-FASTING   Result Value Ref Range    SODIUM 130 (L) 136 - 145 mmol/L    POTASSIUM 3.8 3.5 - 5.1 mmol/L    CHLORIDE 98 96 - 111 mmol/L    CO2 TOTAL 26 23 - 31 mmol/L    ANION GAP 6 4 - 13 mmol/L    BUN 25 8 - 25 mg/dL    CREATININE 0.86 0.60 - 1.05 mg/dL    BUN/CREA RATIO 29 (H) 6 - 22    ESTIMATED GFR 68 >=60 mL/min/BSA    ALBUMIN 1.7 (L) 3.4 - 4.8 g/dL     CALCIUM 7.4 (L) 8.8 - 10.2 mg/dL    GLUCOSE 115 65 - 125 mg/dL    ALKALINE PHOSPHATASE 73 55 - 145 U/L    ALT (SGPT) 9 8 - 22 U/L    AST (SGOT)  16 8 - 45 U/L    BILIRUBIN TOTAL 0.3 0.3 - 1.3 mg/dL    PROTEIN TOTAL 4.0 (L) 6.0 - 8.0 g/dL   TROPONIN-I   Result Value Ref Range    TROPONIN I 133 (H) 7 - 30 ng/L   B-TYPE NATRIURETIC PEPTIDE   Result Value Ref Range    BNP 746 (H) <=99 pg/mL   CBC WITH DIFF   Result Value Ref Range    WBC 7.5 3.7 - 11.0 x10^3/uL    RBC 2.38 (L) 3.85 - 5.22 x10^6/uL    HGB 7.6 (L) 11.5 - 16.0 g/dL    HCT 24.1 (L) 34.8 - 46.0 %    MCV 101.3 (H) 78.0 - 100.0 fL    MCH 31.9 26.0 - 32.0 pg    MCHC 31.5 31.0 - 35.5 g/dL    RDW-CV 19.5 (H) 11.5 - 15.5 %    PLATELETS 140 (L) 150 - 400 x10^3/uL    MPV 12.2 8.7 - 12.5 fL    NEUTROPHIL % 82 %    LYMPHOCYTE % 4 %    MONOCYTE % 10 %    EOSINOPHIL % 3 %    BASOPHIL % 0 %    NEUTROPHIL # 6.21 1.50 - 7.70 x10^3/uL    LYMPHOCYTE # 0.29 (L) 1.00 - 4.80 x10^3/uL  MONOCYTE # 0.72 0.20 - 1.10 x10^3/uL    EOSINOPHIL # 0.23 <=0.50 x10^3/uL    BASOPHIL # <0.10 <=0.20 x10^3/uL    IMMATURE GRANULOCYTE % 1 0 - 1 %    IMMATURE GRANULOCYTE # <0.10 <0.10 x10^3/uL   POC BLOOD GLUCOSE (RESULTS)   Result Value Ref Range    GLUCOSE, POC 125 (H) 60 - 110 mg/dl   POC BLOOD GLUCOSE (RESULTS)   Result Value Ref Range    GLUCOSE, POC 245 (H) 60 - 110 mg/dl   TROPONIN-I   Result Value Ref Range    TROPONIN I 105 (H) 7 - 30 ng/L   POC BLOOD GLUCOSE (RESULTS)   Result Value  Ref Range    GLUCOSE, POC 219 (H) 60 - 110 mg/dl       Imaging Studies:    XR AP MOBILE CHEST   Final Result by Edi, Radresults In (02/20 1038)   Bilateral effusions with interstitial prominence. Consider   congestive failure. Follow-up suggested      This note was partially created using voice recognition software and is   inherently subject to errors including those of syntax and "sound alike "   substitutions which may escape proof reading.  In such instances, original   meaning may be extrapolated by contextual derivation.         Radiologist location ID: [id number]         CT ANGIO CHEST W IV CONTRAST   Final Result by Edi, Radresults In (02/19 1556)   Negative for pulmonary embolism. Negative for aortic   dissection.      Acute congestive heart failure. Interstitial edema and bilateral pleural   effusions.      Questionable anasarca. Appear to be some edematous type findings in the   soft tissues surrounding the chest.         The CT exam was performed using one or more the following a dose reduction   techniques: Automated exposure control, adjustment of the mA and/or kV   according to the patient's size, or use of iterative reconstruction   technique.      This note was partially created using voice recognition software and is   inherently subject to errors including those of syntax and "sound alike "   substitutions which may escape proof reading.  In such instances, original   meaning may be extrapolated by contextual derivation.         Radiologist location ID: [id number]         XR AP MOBILE CHEST   Final Result by Edi, Radresults In (02/19 1537)   Mild interstitial edema. Possibly a cardiac etiology, but,   lymphangitic metastatic disease could also look like this. Clinical   correlation is advised.      This note was partially created using voice recognition software and is   inherently subject to errors including those of syntax and "sound alike "   substitutions which may escape proof reading.  In such  instances, original   meaning may be extrapolated by contextual derivation.         Radiologist location ID: [id number]         XR AP MOBILE CHEST    (Results Pending)       Assessment/Plan:  Hospital Problems    Acute exacerbation of CHF (congestive heart failure) (CMS HCC)         Date Noted: 01/31/2020      CHF (congestive heart failure) (CMS HCC)  Date Noted: 04/13/2019      COPD (chronic obstructive pulmonary disease) (CMS HCC)         Date Noted: 04/13/2019      Hyperlipidemia         Date Noted: 04/13/2019      Type 2 diabetes mellitus (CMS [facility])         Date Noted: 01/24/2017      Resolved Hospital Problems  No resolved problems to display.      . Acute exacerbation of CHF.  Continue IV Lasix.  Repeat lab work in the morning.  Monitor urine output.  Will continue follow-up.  Marland Kitchen COPD exacerbation.  A cultures been ordered and are pending.  Will start patient on doxycycline.  Continue Rocephin.  Solu-Medrol 62.5 IV q.6 hours.  Will consult respiratory therapy for respiratory protocol.  Will repeat chest x-ray and labs in the morning.  Will continue to follow.  . Hyperlipidemia.  Chronic stable.  . Type 2 diabetes.  Low-carbohydrate diabetic diet.  Sliding scale insulin.  . DVT prophylaxis.  Patient is on Eliquis.    Bethanie Dicker, MD

## 2020-02-01 NOTE — Respiratory Therapy (Signed)
No respiratory distress noted. Continues to use supplemental oxygen at 2L NC. Will continue to monitor.

## 2020-02-01 NOTE — Care Plan (Signed)
Problem: Adult Inpatient Plan of Care  Goal: Optimal Comfort and Wellbeing  Outcome: Ongoing (see interventions/notes)   Pain medication given once this shift for abdominal pain. Patient tolerated well. Lightweight bedding and clothing. Room temperature and lighting adjusted.

## 2020-02-01 NOTE — Care Plan (Signed)
Problem: Adult Inpatient Plan of Care  Goal: Plan of Care Review  Outcome: Ongoing (see interventions/notes)  Goal: Patient-Specific Goal (Individualized)  Outcome: Ongoing (see interventions/notes)  Goal: Absence of Hospital-Acquired Illness or Injury  Outcome: Ongoing (see interventions/notes)  Goal: Optimal Comfort and Wellbeing  Outcome: Ongoing (see interventions/notes)  Goal: Rounds/Family Conference  Outcome: Ongoing (see interventions/notes)     Problem: Fall Injury Risk  Goal: Absence of Fall and Fall-Related Injury  Outcome: Ongoing (see interventions/notes)     Problem: Skin Injury Risk Increased  Goal: Skin Health and Integrity  Outcome: Ongoing (see interventions/notes)     Problem: Cardiac Output Decreased  Goal: Effective Cardiac Output  Outcome: Ongoing (see interventions/notes)     Problem: Fluid Volume Excess  Goal: Fluid Balance  Outcome: Ongoing (see interventions/notes)   Monitor and Manage Hypervolemia  Assess fluid requirements and deficit to determine goal-directed fluid therapy.  Keep accurate intake, output and daily weight; monitor trends.  Monitor laboratory value trends and need for treatment adjustment.  Anticipate need for diuretic agent; monitor effects if administered (e.g., blood pressure change, dysrhythmia, electrolyte alteration).  Evaluate need for fluid restriction; monitor response.  Encourage oral intake when able; determine need for intravenous fluid therapy.  Monitor and maintain skin integrity; avoid constrictive devices and clothing if edema is present.  Maintain optimal position to relieve discomfort, work of breathing and ventilation/perfusion mismatch.  Associated Documentation   Fluid/Electrolyte Management

## 2020-02-01 NOTE — Nurses Notes (Signed)
02/01/20 1101   Implantable Port Right Chest   Placement Date/Time: 02/01/20 1056   Infusaport Type: Power Port  Orientation: Right  Location: Chest   Site Condition WDL   Infusaport Access 22 gauge;1 inch   DRESSING TYPE Transparent   DRESSING STATUS Changed/ New   Evaluation of Need Poor vascular access   Daily Site Maintenance & Management Met      02/01/20 1101   Implantable Port Right Chest   Placement Date/Time: 02/01/20 1056   Infusaport Type: Power Port  Orientation: Right  Location: Chest   Site Condition WDL   Infusaport Access 22 gauge;1 inch   DRESSING TYPE Transparent   DRESSING STATUS Changed/ New   Evaluation of Need Poor vascular access   Daily Site Maintenance & Management Met      02/01/20 1101   Implantable Port Right Chest   Placement Date/Time: 02/01/20 1056   Infusaport Type: Power Port  Orientation: Right  Location: Chest   Site Condition WDL   Infusaport Access 22 gauge;1 inch   DRESSING TYPE Transparent   DRESSING STATUS Changed/ New   Evaluation of Need Poor vascular access   Daily Site Maintenance & Management Met

## 2020-02-01 NOTE — Respiratory Therapy (Signed)
No respiratory changes over night.

## 2020-02-02 ENCOUNTER — Inpatient Hospital Stay (HOSPITAL_COMMUNITY): Payer: Medicare Other

## 2020-02-02 LAB — CBC WITH DIFF
BASOPHIL #: <0.1 10*3/uL (ref <=0.20)
BASOPHIL %: 0 %
EOSINOPHIL #: <0.1 10*3/uL (ref <=0.50)
EOSINOPHIL %: 0 %
HCT: 24.9 % — ABNORMAL LOW (ref 34.8–46.0)
HGB: 8 g/dL — ABNORMAL LOW (ref 11.5–16.0)
IMMATURE GRANULOCYTE #: <0.1 10*3/uL (ref <0.10)
IMMATURE GRANULOCYTE %: 2 % — ABNORMAL HIGH (ref 0–1)
LYMPHOCYTE #: 0.16 10*3/uL — ABNORMAL LOW (ref 1.00–4.80)
LYMPHOCYTE %: 8 %
MCH: 32.1 pg — ABNORMAL HIGH (ref 26.0–32.0)
MCHC: 32.1 g/dL (ref 31.0–35.5)
MCV: 100 fL (ref 78.0–100.0)
MONOCYTE #: <0.1 10*3/uL — ABNORMAL LOW (ref 0.20–1.10)
MONOCYTE %: 2 %
MPV: 11.9 fL (ref 8.7–12.5)
NEUTROPHIL #: 1.81 10*3/uL (ref 1.50–7.70)
NEUTROPHIL %: 88 %
PLATELETS: 129 10*3/uL — ABNORMAL LOW (ref 150–400)
RBC: 2.49 10*6/uL — ABNORMAL LOW (ref 3.85–5.22)
RDW-CV: 18.2 % — ABNORMAL HIGH (ref 11.5–15.5)
WBC: 2.1 10*3/uL — ABNORMAL LOW (ref 3.7–11.0)

## 2020-02-02 LAB — BASIC METABOLIC PANEL
ANION GAP: 8 mmol/L (ref 4–13)
BUN/CREA RATIO: 28 — ABNORMAL HIGH (ref 6–22)
BUN: 29 mg/dL — ABNORMAL HIGH (ref 8–25)
CALCIUM: 7.4 mg/dL — ABNORMAL LOW (ref 8.8–10.2)
CHLORIDE: 96 mmol/L (ref 96–111)
CO2 TOTAL: 25 mmol/L (ref 23–31)
CREATININE: 1.02 mg/dL (ref 0.60–1.05)
ESTIMATED GFR: 55 mL/min/BSA — ABNORMAL LOW (ref >=60)
GLUCOSE: 314 mg/dL — ABNORMAL HIGH (ref 65–125)
POTASSIUM: 3.9 mmol/L (ref 3.5–5.1)
SODIUM: 129 mmol/L — ABNORMAL LOW (ref 136–145)

## 2020-02-02 LAB — POC BLOOD GLUCOSE (RESULTS)
GLUCOSE, POC: 312 mg/dL — ABNORMAL HIGH (ref 60–110)
GLUCOSE, POC: 337 mg/dL — ABNORMAL HIGH (ref 60–110)
GLUCOSE, POC: 358 mg/dL — ABNORMAL HIGH (ref 60–110)

## 2020-02-02 MED ORDER — NYSTATIN 100,000 UNIT/GRAM TOPICAL POWDER
Freq: Two times a day (BID) | CUTANEOUS | Status: DC
Start: 2020-02-02 — End: 2020-02-06
  Administered 2020-02-05: 21:00:00 0 mL via TOPICAL
  Filled 2020-02-02 (×2): qty 15

## 2020-02-02 MED ORDER — HEPARIN, PORCINE (PF) 100 UNIT/ML INTRAVENOUS SYRINGE
5.00 mL | INJECTION | Freq: Two times a day (BID) | INTRAVENOUS | Status: DC | PRN
Start: 2020-02-02 — End: 2020-02-06
  Administered 2020-02-02 – 2020-02-06 (×5): 5 mL
  Filled 2020-02-02 (×5): qty 5

## 2020-02-02 NOTE — Nurses Notes (Signed)
Pt has voided one time since given IV lasix moderate amt unable to measure pt had BM in the urine.

## 2020-02-02 NOTE — Nurses Notes (Signed)
Dr Purvis Kilts notified about urine output.  No new orders received.

## 2020-02-02 NOTE — Respiratory Therapy (Signed)
Weaned to RA today. No respiratory problems

## 2020-02-02 NOTE — Care Plan (Signed)
Problem: Fall Injury Risk  Goal: Absence of Fall and Fall-Related Injury  Outcome: Ongoing (see interventions/notes)   Patient remained free of falls this shift. Call bell and personal items in reach, bed in low position, side rails up x2.

## 2020-02-02 NOTE — Progress Notes (Signed)
[facility]  Hospitalist  Progress Note    Date of Service:  02/02/2020  [name], [name], 74 y.o. female  Date of Admission:  01/31/2020  Date of Birth:  May 24, 1946  PCP: Myrene Buddy, MD    HPI:    Patient admitted to the hospital with acute exacerbation of CHF.  Patient is feeling a little better today.  Patient has been started on doxycycline Rocephin for COPD exacerbation as well.  Patient continues to complain of swelling over lower extremities.  Patient has been afebrile overnight.  No new complaints at this time.    acetaminophen (TYLENOL) tablet, 650 mg, Oral, Q4H PRN  aluminum-magnesium hydroxide-simethicone (MAG-AL PLUS) 200-200-20 mg per 5 mL oral liquid, 20 mL, Oral, Q4H PRN  apixaban (ELIQUIS) tablet, 2.5 mg, Oral, 2x/day  aspirin (ECOTRIN) enteric coated tablet 81 mg, 81 mg, Oral, Daily  atorvastatin (LIPITOR) tablet, 10 mg, Oral, QPM  cefTRIAXone (ROCEPHIN) 1 g in NS 100 mL IVPB, 1 g, Intravenous, Q24H  dextrose 50% (0.5 g/mL) injection - syringe, 12.5 g, Intravenous, Q15 Min PRN  dilTIAZem (CARDIZEM) tablet, 30 mg, Oral, 4x/day  doxycycline hyclate 100 mg in NS 100 mL IVPB, 100 mg, Intravenous, Q12H  furosemide (LASIX) 10 mg/mL injection, 40 mg, Intravenous, 2x/day  heparin flush PF (HEPLOCK) 100 units/mL injection, 5 mL, Intracatheter, Q12H PRN  hydrALAZINE (APRESOLINE) tablet, 10 mg, Oral, Q8HRS  HYDROcodone-acetaminophen (NORCO) 5-325 mg per tablet, 1 Tablet, Oral, Q4H PRN  insulin glargine 100 units/mL injection, 10 Units, Subcutaneous, NIGHTLY  loratadine (CLARITIN) tablet, 10 mg, Oral, Daily  losartan (COZAAR) tablet, 50 mg, Oral, Daily  magnesium hydroxide (MILK OF MAGNESIA) 400mg  per 40mL oral liquid, 15 mL, Oral, Daily PRN  magnesium oxide (MAG-OX) tablet, 400 mg, Oral, 2x/day  methylPREDNISolone sod succ (SOLU-MEDROL) 125 mg/2 mL injection, 62.5 mg, Intravenous, Q6H  metoprolol succinate (TOPROL-XL) 24 hr extended release tablet, 25 mg, Oral, Daily  NS bolus infusion  50 mL, 50 mL, Intravenous, Give in Radiology  NS flush syringe, 10 mL, Intracatheter, Q8HRS  NS flush syringe, 10 mL, Intracatheter, Q1H PRN  nystatin (NYSTOP) 100,000 units/g topical powder, , Apply Topically, 2x/day  ondansetron (ZOFRAN) 2 mg/mL injection, 4 mg, Intravenous, Q6H PRN  pancreatic enzyme replacement (CREON) 12,000 units lipase per cap, 1 Capsule, Oral, 3x/day-Meals  pantoprazole (PROTONIX) delayed release tablet, 40 mg, Oral, Daily  SSIP insulin lispro (HUMALOG) 100 units/mL injection, 0-12 Units, Subcutaneous, 4x/day PRN         Allergies   Allergen Reactions   . Sulfa (Sulfonamides) Itching             Filed Vitals:    02/02/20 1125 02/02/20 1510 02/02/20 1537 02/02/20 1618   BP: 127/63  (!) 152/64    Pulse: 73  76    Resp: (!) 24  20 16    Temp: 36.2 C (97.2 F)  36.4 C (97.6 F)    SpO2:  98%         Physical Exam  HENT:      Head: Normocephalic and atraumatic.   Eyes:      Conjunctiva/sclera: Conjunctivae normal.      Pupils: Pupils are equal, round, and reactive to light.   Cardiovascular:      Rate and Rhythm: Normal rate and regular rhythm.      Heart sounds: Normal heart sounds. No murmur. No friction rub. No gallop.    Pulmonary:      Effort: No respiratory distress.      Breath  sounds: Rales present. No wheezing.   Abdominal:      General: Bowel sounds are normal. There is no distension.      Palpations: Abdomen is soft. There is no mass.      Tenderness: There is no abdominal tenderness. There is no guarding or rebound.   Musculoskeletal:         General: No deformity. Normal range of motion.      Cervical back: Normal range of motion and neck supple.      Right lower leg: 3+ Pitting Edema present.      Left lower leg: 3+ Pitting Edema present.   Skin:     General: Skin is warm and dry.      Findings: No erythema or rash.   Neurological:      Mental Status: She is alert and oriented to person, place, and time.      Cranial Nerves: No cranial nerve deficit.   Psychiatric:         Mood and  Affect: Affect normal.         Cognition and Memory: Memory normal.         Laboratory Data:     Results for orders placed or performed during the hospital encounter of 01/31/20 (from the past 24 hour(s))   POC BLOOD GLUCOSE (RESULTS)   Result Value Ref Range    GLUCOSE, POC 245 (H) 60 - 110 mg/dl   BASIC METABOLIC PANEL   Result Value Ref Range    SODIUM 129 (L) 136 - 145 mmol/L    POTASSIUM 3.9 3.5 - 5.1 mmol/L    CHLORIDE 96 96 - 111 mmol/L    CO2 TOTAL 25 23 - 31 mmol/L    ANION GAP 8 4 - 13 mmol/L    CALCIUM 7.4 (L) 8.8 - 10.2 mg/dL    GLUCOSE 314 (H) 65 - 125 mg/dL    BUN 29 (H) 8 - 25 mg/dL    CREATININE 1.02 0.60 - 1.05 mg/dL    BUN/CREA RATIO 28 (H) 6 - 22    ESTIMATED GFR 55 (L) >=60 mL/min/BSA   CBC WITH DIFF   Result Value Ref Range    WBC 2.1 (L) 3.7 - 11.0 x10^3/uL    RBC 2.49 (L) 3.85 - 5.22 x10^6/uL    HGB 8.0 (L) 11.5 - 16.0 g/dL    HCT 24.9 (L) 34.8 - 46.0 %    MCV 100.0 78.0 - 100.0 fL    MCH 32.1 (H) 26.0 - 32.0 pg    MCHC 32.1 31.0 - 35.5 g/dL    RDW-CV 18.2 (H) 11.5 - 15.5 %    PLATELETS 129 (L) 150 - 400 x10^3/uL    MPV 11.9 8.7 - 12.5 fL    NEUTROPHIL % 88 %    LYMPHOCYTE % 8 %    MONOCYTE % 2 %    EOSINOPHIL % 0 %    BASOPHIL % 0 %    NEUTROPHIL # 1.81 1.50 - 7.70 x10^3/uL    LYMPHOCYTE # 0.16 (L) 1.00 - 4.80 x10^3/uL    MONOCYTE # <0.10 (L) 0.20 - 1.10 x10^3/uL    EOSINOPHIL # <0.10 <=0.50 x10^3/uL    BASOPHIL # <0.10 <=0.20 x10^3/uL    IMMATURE GRANULOCYTE % 2 (H) 0 - 1 %    IMMATURE GRANULOCYTE # <0.10 <0.10 x10^3/uL   POC BLOOD GLUCOSE (RESULTS)   Result Value Ref Range    GLUCOSE, POC 312 (H) 60 - 110 mg/dl  POC BLOOD GLUCOSE (RESULTS)   Result Value Ref Range    GLUCOSE, POC 337 (H) 60 - 110 mg/dl   POC BLOOD GLUCOSE (RESULTS)   Result Value Ref Range    GLUCOSE, POC 358 (H) 60 - 110 mg/dl       Imaging Studies:    XR AP MOBILE CHEST   Final Result by Edi, Radresults In (02/21 1010)   Similar to prior study      This note was partially created using voice recognition software and is      inherently subject to errors including those of syntax and "sound alike "   substitutions which may escape proof reading.  In such instances, original   meaning may be extrapolated by contextual derivation.         Radiologist location ID: [id number]         XR AP MOBILE CHEST   Final Result by Edi, Radresults In (02/20 1038)   Bilateral effusions with interstitial prominence. Consider   congestive failure. Follow-up suggested      This note was partially created using voice recognition software and is   inherently subject to errors including those of syntax and "sound alike "   substitutions which may escape proof reading.  In such instances, original   meaning may be extrapolated by contextual derivation.         Radiologist location ID: [id number]         CT ANGIO CHEST W IV CONTRAST   Final Result by Edi, Radresults In (02/19 1556)   Negative for pulmonary embolism. Negative for aortic   dissection.      Acute congestive heart failure. Interstitial edema and bilateral pleural   effusions.      Questionable anasarca. Appear to be some edematous type findings in the   soft tissues surrounding the chest.         The CT exam was performed using one or more the following a dose reduction   techniques: Automated exposure control, adjustment of the mA and/or kV   according to the patient's size, or use of iterative reconstruction   technique.      This note was partially created using voice recognition software and is   inherently subject to errors including those of syntax and "sound alike "   substitutions which may escape proof reading.  In such instances, original   meaning may be extrapolated by contextual derivation.         Radiologist location ID: [id number]         XR AP MOBILE CHEST   Final Result by Edi, Radresults In (02/19 1537)   Mild interstitial edema. Possibly a cardiac etiology, but,   lymphangitic metastatic disease could also look like this. Clinical   correlation is advised.      This note was partially  created using voice recognition software and is   inherently subject to errors including those of syntax and "sound alike "   substitutions which may escape proof reading.  In such instances, original   meaning may be extrapolated by contextual derivation.         Radiologist location ID: [id number]             Assessment/Plan:  Hospital Problems    Acute exacerbation of CHF (congestive heart failure) (CMS [facility])         Date Noted: 01/31/2020      CHF (congestive heart failure) (CMS [facility])  Date Noted: 04/13/2019      COPD (chronic obstructive pulmonary disease) (CMS [facility])         Date Noted: 04/13/2019      Hyperlipidemia         Date Noted: 04/13/2019      Type 2 diabetes mellitus (CMS [facility])         Date Noted: 01/24/2017      Resolved Hospital Problems  No resolved problems to display.      . Acute exacerbation of CHF.  Continue IV Lasix.  Will place a Foley for better monitoring of urine output.  Repeat lab work in the morning.  Will continue follow-up.  Marland Kitchen COPD exacerbation. Blood cultures are pending.  Continue doxycycline Rocephin.  Continue Solu-Medrol 62.5 IV q.6 hours.  Respiratory therapy for respiratory protocol.  Repeat chest x-ray in the morning.  Marland Kitchen Hyperlipidemia.  Chronic stable.  . Type 2 diabetes.  Diabetic diet.  Sliding scale insulin.  . DVT prophylaxis.  Patient is on Eliquis.    Bethanie Dicker, MD

## 2020-02-02 NOTE — Nurses Notes (Signed)
Pt assisted to [facility]  States wants to ambulate in hallway later today.

## 2020-02-02 NOTE — Care Plan (Signed)
Problem: Fluid Volume Excess  Goal: Fluid Balance  Outcome: Ongoing (see interventions/notes)   Intervention: Elevate legs while in chair and in bed.  Document I&O q shift.  Outcome:  Pt has had legs elevated while in bed, but were not elevated while in chair.  Pt ambulated in room this shift.

## 2020-02-03 ENCOUNTER — Inpatient Hospital Stay (HOSPITAL_COMMUNITY): Payer: Medicare Other

## 2020-02-03 LAB — CBC WITH DIFF
BASOPHIL #: <0.1 10*3/uL (ref <=0.20)
BASOPHIL %: 0 %
EOSINOPHIL #: <0.1 10*3/uL (ref <=0.50)
EOSINOPHIL %: 0 %
HCT: 25.1 % — ABNORMAL LOW (ref 34.8–46.0)
HGB: 8.4 g/dL — ABNORMAL LOW (ref 11.5–16.0)
IMMATURE GRANULOCYTE #: <0.1 10*3/uL (ref <0.10)
IMMATURE GRANULOCYTE %: 1 % (ref 0–1)
LYMPHOCYTE #: 0.2 10*3/uL — ABNORMAL LOW (ref 1.00–4.80)
LYMPHOCYTE %: 4 %
MCH: 32.4 pg — ABNORMAL HIGH (ref 26.0–32.0)
MCHC: 33.5 g/dL (ref 31.0–35.5)
MCV: 96.9 fL (ref 78.0–100.0)
MONOCYTE #: 0.2 10*3/uL (ref 0.20–1.10)
MONOCYTE %: 4 %
MPV: 12.5 fL (ref 8.7–12.5)
NEUTROPHIL #: 4.73 10*3/uL (ref 1.50–7.70)
NEUTROPHIL %: 91 %
PLATELETS: 161 10*3/uL (ref 150–400)
RBC: 2.59 10*6/uL — ABNORMAL LOW (ref 3.85–5.22)
RDW-CV: 18.2 % — ABNORMAL HIGH (ref 11.5–15.5)
WBC: 5.2 10*3/uL (ref 3.7–11.0)

## 2020-02-03 LAB — COMPREHENSIVE METABOLIC PANEL, NON-FASTING
ALBUMIN: 1.9 g/dL — ABNORMAL LOW (ref 3.4–4.8)
ALKALINE PHOSPHATASE: 75 U/L (ref 55–145)
ALT (SGPT): 7 U/L — ABNORMAL LOW (ref 8–22)
ANION GAP: 9 mmol/L (ref 4–13)
AST (SGOT): 12 U/L (ref 8–45)
BILIRUBIN TOTAL: 0.2 mg/dL — ABNORMAL LOW (ref 0.3–1.3)
BUN/CREA RATIO: 29 — ABNORMAL HIGH (ref 6–22)
BUN: 32 mg/dL — ABNORMAL HIGH (ref 8–25)
CALCIUM: 7.5 mg/dL — ABNORMAL LOW (ref 8.8–10.2)
CHLORIDE: 94 mmol/L — ABNORMAL LOW (ref 96–111)
CO2 TOTAL: 23 mmol/L (ref 23–31)
CREATININE: 1.11 mg/dL — ABNORMAL HIGH (ref 0.60–1.05)
ESTIMATED GFR: 50 mL/min/BSA — ABNORMAL LOW (ref >=60)
GLUCOSE: 335 mg/dL — ABNORMAL HIGH (ref 65–125)
POTASSIUM: 4 mmol/L (ref 3.5–5.1)
PROTEIN TOTAL: 4.3 g/dL — ABNORMAL LOW (ref 6.0–8.0)
SODIUM: 126 mmol/L — ABNORMAL LOW (ref 136–145)

## 2020-02-03 NOTE — PT Evaluation (Signed)
[facility]  Physical Therapy Initial Evaluation    Patient Name: [name]  Date of Birth: Apr 11, 1946  Height: Height: 157.5 cm (5' 2.01")  Weight: Weight: 87.6 kg (193 lb 3 oz)  Room/Bed: 1136/B  Payor: MEDICARE / Plan: MEDICARE PART A AND B / Product Type: Medicare /     Assessment:   Patient currently requires minimal to CGA x 1 on most of her transfers and ambulation with use of a FWW as an AD. She tolerated a total of 750 Ft with x 2 sitting rest breaks. She required more assistance on sit to supine mostly to assist the legs back to bed secondary to significant edema of BLE's.    Plan:   Current Intervention: (P) gait training, bed mobility training, strengthening, stair training, transfer training  To provide physical therapy services (P) minimum of 5x/week  for duration of (P) until discharge.    The risks/benefits of therapy have been discussed with the patient/caregiver and he/she is in agreement with the established plan of care.       Subjective & Objective     Patient is admitted in acute care secondary to CHF   02/03/20 1500   Rehab Session   Document Type evaluation   Patient Effort excellent   Symptoms Noted During/After Treatment shortness of breath   General Information   Patient Profile Reviewed yes   Respiratory Status nasal cannula   Mutuality/Individual Preferences   Anxieties, Fears or Concerns concerned about her fluid weight gain   Plan of Care Reviewed With patient   Living Environment   Lives With sibling(s);child(ren), adult   Home Accessibility other (see comments)  (has 5 steps in son's house, no steps in her sisters house)   Functional Level Prior   Ambulation 1 - assistive equipment   Transferring 1 - assistive equipment   Pre Treatment Status   Pre Treatment Patient Status Patient sitting in bedside chair or w/c   Support Present Pre Treatment  None   Communication Pre Treatment  Nurse   Cognitive Assessment/Interventions      Behavior/Mood Observations behavior appropriate to situation, WNL/WFL   Orientation Status oriented x 4   Attention WNL/WFL   Follows Commands WNL   Vital Signs   Pre-Treatment Heart Rate (beats/min) 89   Post-treatment Heart Rate (beats/min) 87   Pre SpO2 (%) 95   O2 Delivery Pre Treatment supplemental O2   Post SpO2 (%) 96   O2 Delivery Post Treatment supplemental O2   RLE Assessment   RLE Assessment X-Exceptions   RLE ROM LOM secondary to edema   RLE Strength 3-/5   LLE Assessment   LLE Assessment X-Exceptions   LLE ROM LOM secondary to edema   LLE Strength 3-/5   Transfer Assessment/Treatment   Sit-Stand Independence minimum assist (75% patient effort);stand-by assistance   Stand-Sit Independence stand-by assistance;minimum assist (75% patient effort)   Sit-Stand-Sit, Assist Device walker, front wheeled   Chair-Bed Independence moderate assist (50% patient effort)   Gait Assessment/Treatment   Total Distance Ambulated 750   Independence  stand-by assistance;minimum assist (75% patient effort)   Assistive Device  walker, front wheeled   Distance in Feet 750 Ft with x 2 sitting rest breaks   Deviations  double stance time increased   Maintain Weight Bearing Status able to maintain   Balance Skill Training   Sitting Balance: Static good balance   Sitting, Dynamic (Balance) good balance   Sit-to-Stand Balance good balance  Standing Balance: Static good balance   Standing Balance: Dynamic good balance   Post Treatment Status   Post Treatment Patient Status Patient supine in bed   Support Present Post Treatment  None   Plan of Care Review   Plan Of Care Reviewed With patient   Physical Therapy Clinical Impression   Pathology/Pathophysiology Noted musculoskeletal   Impairments Found (describe specific impairments) gait, locomotion, and balance   Therapy Frequency minimum of 5x/week   Predicted Duration of Therapy Intervention (days/wks) until discharge   Care Plan Goals   PT Rehab Goals Bed Mobility Goal;Stairs  Training Goal;Strength Goal;Transfer Training Goal;Gait Training Goal   Bed Mobility Goal   Bed Mobility Goal, Date Established 02/03/20   Bed Mobility Goal, Time to Achieve by discharge   Bed Mobility Goal, Activity Type roll left/roll right;supine to sit/sit to supine;sidelying to sit/sit to sidelying   Bed Mobility Goal, Independence Level modified independence;independent   Bed Mobility Goal, Assistive Device least restrictive assistive device   Gait Training  Goal, Distance to Achieve   Gait Training  Goal, Date Established 02/03/20   Gait Training  Goal, Time to Achieve by discharge   Gait Training  Goal, Independence Level independent;modified independence   Gait Training  Goal, Assist Device walker, rolling   Gait Training  Goal, Distance to Achieve 250+   Strength Goal   Strength Goal, Date Established 02/03/20   Strength Goal, Time to Achieve by discharge   Strength Goal, Measure to Achieve promote increased strength from current   Strength Goal, Functional Goal resist at least 2# load   Stairs Training Goal   Stairs Training Goal, Date Established 02/03/20   Stairs Training Goal, Time to Achieve by discharge   Stairs Training Goal, Independence Level contact guard assist;stand-by assistance   Stairs Training Goal, Assist Device least restricted assistive device   Stairs Training Goal, Number of Stairs to Achieve 5   Transfer Training Goal   Transfer Training Goal, Date Established 02/03/20   Transfer Training Goal, Time to Achieve by discharge   Transfer Training Goal, Activity Type bed-to-chair/chair-to-bed;sit-to-stand/stand-to-sit   Transfer Training Goal, Independence Level modified independence;independent   Transfer Training Goal, Assist Device least restrictrictive assistive device   Planned Therapy Interventions, PT Eval   Planned Therapy Interventions (PT) gait training;bed mobility training;strengthening;stair training;transfer training       Therapist:   Jessie Foot, PT, DPT       Bethanie Dicker, MD  02/04/2020, 20:44

## 2020-02-03 NOTE — Respiratory Therapy (Signed)
Pt sating 98% on room air, no respiratory distress throughout the day.

## 2020-02-03 NOTE — Nurses Notes (Signed)
Patient ambulating in hallway with physical therapy, without difficulty. Dalia Heading, RN  02/03/2020, 15:20

## 2020-02-03 NOTE — Nurses Notes (Signed)
36fr foley cath inserted using aseptic technique with immediate return of yellow urine measuring 350cc. Tolerated well, Rested with eyes closed during the procedure.

## 2020-02-03 NOTE — Care Management Notes (Signed)
02/03/20 1644   Assessment Details   Assessment Type Admission   Date of Care Management Update 02/03/20   Readmission   Is this a readmission? No   Medicare Intent to Discharge Documentation   Admit IMM given to: Patient   Admit IMM letter given date 02/01/20   Admit IMM letter time given 1348   IMM explained/reviewed with:  Patient   Care Management Plan   Discharge Planning Status initial meeting   Projected Discharge Date 02/04/20   Discharge plan discussed with: Patient   CM will evaluate for rehabilitation potential no   Discharge Needs Assessment   Was referral sent to APS? No   Equipment Currently Used at Home cane, straight;other (see comments)  (rollator)   Equipment Needed After Discharge none   Discharge Facility/Level of Care Needs Home (Patient/Family Member/other)(code 1)   Transportation Available car;family or friend will provide   Referral Information   Admission Type inpatient   Address Verified verified-no changes   Arrived From home or self-care   Insurance Verified verified-no change   ADVANCE DIRECTIVES   Does the Patient have an Advance Directive? Yes, Patient Does Have Advance Directive for Healthcare Treatment   Type of Advance Directive Completed Medical Power of Attorney   Copy of Advance Directives in Chart? 6   Name of MPOA or [place] - sister   Phone Number of [place] or Healthcare Surrogate [phone] or [phone]   Patient Requests Assistance in Having Advance Directive Notarized. N/A   LAY CAREGIVER    Appointed Lay Caregiver? I Decline   Employment/Financial   Patient has Prescription Coverage?  Yes        Name of Insurance Coverage for Medications [employer]   Financial Concerns none   Mutuality/Individual Preferences    Patient-Specific Goals (Include Timeframe) to not have to wear oxygen   Anxieties, Fears or Concerns denies   Individualized Care Needs respiratory protocol as ordered   Living Environment   Lives With child(ren), adult;sibling(s);other  (see comments)  (takes turns staying with sister and with son and daughter in law)   Living Arrangements house   Able to Return to Prior Arrangements yes   Living Arrangement Comments each home has bath room and bed room on same level   Home Safety   Home Assessment: No Problems Identified   Home Accessibility no concerns;other (see comments)  (0 steps into sisters house and 5 steps into sons house)   Legal Issues   Do you have a court appointed guardian/conservator? No   Patient Hand-Off   Clinical/Discharge Plan of Care Information Communicated to:  Clinical Care Coordinator

## 2020-02-03 NOTE — Care Management Notes (Signed)
Inbasket consult: Patient has an MPOA and it is scanned into Epic.  Confirmed with patient that this was the most current copy and she does not want to make any changes.

## 2020-02-03 NOTE — Progress Notes (Signed)
[facility]  Hospitalist  Progress Note    Date of Service:  02/03/2020  [name], [name], 74 y.o. female  Date of Admission:  01/31/2020  Date of Birth:  1946-08-22  PCP: Myrene Buddy, MD    HPI:    Patient admitted to the hospital with acute exacerbation of CHF.  Patient continues to slowly improve.  The patient is on doxycycline Rocephin for COPD exacerbation.  Patient seems to be tolerating this well.  Patient has been afebrile for the last several days.  No new complaints.    acetaminophen (TYLENOL) tablet, 650 mg, Oral, Q4H PRN  aluminum-magnesium hydroxide-simethicone (MAG-AL PLUS) 200-200-20 mg per 5 mL oral liquid, 20 mL, Oral, Q4H PRN  apixaban (ELIQUIS) tablet, 2.5 mg, Oral, 2x/day  aspirin (ECOTRIN) enteric coated tablet 81 mg, 81 mg, Oral, Daily  atorvastatin (LIPITOR) tablet, 10 mg, Oral, QPM  cefTRIAXone (ROCEPHIN) 1 g in NS 100 mL IVPB, 1 g, Intravenous, Q24H  dextrose 50% (0.5 g/mL) injection - syringe, 12.5 g, Intravenous, Q15 Min PRN  dilTIAZem (CARDIZEM) tablet, 30 mg, Oral, 4x/day  doxycycline hyclate 100 mg in NS 100 mL IVPB, 100 mg, Intravenous, Q12H  furosemide (LASIX) 10 mg/mL injection, 40 mg, Intravenous, 2x/day  heparin flush PF (HEPLOCK) 100 units/mL injection, 5 mL, Intracatheter, Q12H PRN  hydrALAZINE (APRESOLINE) tablet, 10 mg, Oral, Q8HRS  HYDROcodone-acetaminophen (NORCO) 5-325 mg per tablet, 1 Tablet, Oral, Q4H PRN  insulin glargine 100 units/mL injection, 10 Units, Subcutaneous, NIGHTLY  loratadine (CLARITIN) tablet, 10 mg, Oral, Daily  losartan (COZAAR) tablet, 50 mg, Oral, Daily  magnesium hydroxide (MILK OF MAGNESIA) 400mg  per 47mL oral liquid, 15 mL, Oral, Daily PRN  magnesium oxide (MAG-OX) tablet, 400 mg, Oral, 2x/day  methylPREDNISolone sod succ (SOLU-MEDROL) 125 mg/2 mL injection, 62.5 mg, Intravenous, Q6H  metoprolol succinate (TOPROL-XL) 24 hr extended release tablet, 25 mg, Oral, Daily  NS bolus infusion 50 mL, 50 mL, Intravenous, Give in  Radiology  NS flush syringe, 10 mL, Intracatheter, Q8HRS  NS flush syringe, 10 mL, Intracatheter, Q1H PRN  nystatin (NYSTOP) 100,000 units/g topical powder, , Apply Topically, 2x/day  ondansetron (ZOFRAN) 2 mg/mL injection, 4 mg, Intravenous, Q6H PRN  pancreatic enzyme replacement (CREON) 12,000 units lipase per cap, 1 Capsule, Oral, 3x/day-Meals  pantoprazole (PROTONIX) delayed release tablet, 40 mg, Oral, Daily  SSIP insulin lispro (HUMALOG) 100 units/mL injection, 0-12 Units, Subcutaneous, 4x/day PRN         Allergies   Allergen Reactions   . Sulfa (Sulfonamides) Itching             Filed Vitals:    02/03/20 0705 02/03/20 0836 02/03/20 1130 02/03/20 1200   BP: 134/80  (!) 160/61    Pulse: 80  79    Resp: 16  16    Temp: 36.4 C (97.5 F)  36.3 C (97.3 F)    SpO2: 96% 98%  96%       Physical Exam  HENT:      Head: Normocephalic and atraumatic.   Eyes:      Conjunctiva/sclera: Conjunctivae normal.      Pupils: Pupils are equal, round, and reactive to light.   Cardiovascular:      Rate and Rhythm: Normal rate and regular rhythm.      Heart sounds: Normal heart sounds. No murmur. No friction rub. No gallop.    Pulmonary:      Effort: No respiratory distress.      Breath sounds: Rales present. No wheezing.  Abdominal:      General: Bowel sounds are normal. There is no distension.      Palpations: Abdomen is soft. There is no mass.      Tenderness: There is no abdominal tenderness. There is no guarding or rebound.   Musculoskeletal:         General: No deformity. Normal range of motion.      Cervical back: Normal range of motion and neck supple.      Right lower leg: 3+ Pitting Edema present.      Left lower leg: 3+ Pitting Edema present.   Skin:     General: Skin is warm and dry.      Findings: No erythema or rash.   Neurological:      Mental Status: She is alert and oriented to person, place, and time.      Cranial Nerves: No cranial nerve deficit.   Psychiatric:         Mood and Affect: Affect normal.          Cognition and Memory: Memory normal.         Laboratory Data:     Results for orders placed or performed during the hospital encounter of 01/31/20 (from the past 24 hour(s))   POC BLOOD GLUCOSE (RESULTS)   Result Value Ref Range    GLUCOSE, POC 358 (H) 60 - 110 mg/dl   COMPREHENSIVE METABOLIC PANEL, NON-FASTING   Result Value Ref Range    SODIUM 126 (L) 136 - 145 mmol/L    POTASSIUM 4.0 3.5 - 5.1 mmol/L    CHLORIDE 94 (L) 96 - 111 mmol/L    CO2 TOTAL 23 23 - 31 mmol/L    ANION GAP 9 4 - 13 mmol/L    BUN 32 (H) 8 - 25 mg/dL    CREATININE 1.11 (H) 0.60 - 1.05 mg/dL    BUN/CREA RATIO 29 (H) 6 - 22    ESTIMATED GFR 50 (L) >=60 mL/min/BSA    ALBUMIN 1.9 (L) 3.4 - 4.8 g/dL     CALCIUM 7.5 (L) 8.8 - 10.2 mg/dL    GLUCOSE 335 (H) 65 - 125 mg/dL    ALKALINE PHOSPHATASE 75 55 - 145 U/L    ALT (SGPT) 7 (L) 8 - 22 U/L    AST (SGOT)  12 8 - 45 U/L    BILIRUBIN TOTAL 0.2 (L) 0.3 - 1.3 mg/dL    PROTEIN TOTAL 4.3 (L) 6.0 - 8.0 g/dL   CBC WITH DIFF   Result Value Ref Range    WBC 5.2 3.7 - 11.0 x10^3/uL    RBC 2.59 (L) 3.85 - 5.22 x10^6/uL    HGB 8.4 (L) 11.5 - 16.0 g/dL    HCT 25.1 (L) 34.8 - 46.0 %    MCV 96.9 78.0 - 100.0 fL    MCH 32.4 (H) 26.0 - 32.0 pg    MCHC 33.5 31.0 - 35.5 g/dL    RDW-CV 18.2 (H) 11.5 - 15.5 %    PLATELETS 161 150 - 400 x10^3/uL    MPV 12.5 8.7 - 12.5 fL    NEUTROPHIL % 91 %    LYMPHOCYTE % 4 %    MONOCYTE % 4 %    EOSINOPHIL % 0 %    BASOPHIL % 0 %    NEUTROPHIL # 4.73 1.50 - 7.70 x10^3/uL    LYMPHOCYTE # 0.20 (L) 1.00 - 4.80 x10^3/uL    MONOCYTE # 0.20 0.20 - 1.10 x10^3/uL    EOSINOPHIL # <  0.10 <=0.50 x10^3/uL    BASOPHIL # <0.10 <=0.20 x10^3/uL    IMMATURE GRANULOCYTE % 1 0 - 1 %    IMMATURE GRANULOCYTE # <0.10 <0.10 x10^3/uL       Imaging Studies:    XR AP MOBILE CHEST   Final Result by Edi, Radresults In (02/22 1008)   Slight deterioration.      This note was partially created using voice recognition software and is   inherently subject to errors including those of syntax and "sound alike "    substitutions which may escape proof reading.  In such instances, original   meaning may be extrapolated by contextual derivation.         Radiologist location ID: [id number]         XR AP MOBILE CHEST   Final Result by Edi, Radresults In (02/21 1010)   Similar to prior study      This note was partially created using voice recognition software and is   inherently subject to errors including those of syntax and "sound alike "   substitutions which may escape proof reading.  In such instances, original   meaning may be extrapolated by contextual derivation.         Radiologist location ID: [id number]         XR AP MOBILE CHEST   Final Result by Edi, Radresults In (02/20 1038)   Bilateral effusions with interstitial prominence. Consider   congestive failure. Follow-up suggested      This note was partially created using voice recognition software and is   inherently subject to errors including those of syntax and "sound alike "   substitutions which may escape proof reading.  In such instances, original   meaning may be extrapolated by contextual derivation.         Radiologist location ID: [id number]         CT ANGIO CHEST W IV CONTRAST   Final Result by Edi, Radresults In (02/19 1556)   Negative for pulmonary embolism. Negative for aortic   dissection.      Acute congestive heart failure. Interstitial edema and bilateral pleural   effusions.      Questionable anasarca. Appear to be some edematous type findings in the   soft tissues surrounding the chest.         The CT exam was performed using one or more the following a dose reduction   techniques: Automated exposure control, adjustment of the mA and/or kV   according to the patient's size, or use of iterative reconstruction   technique.      This note was partially created using voice recognition software and is   inherently subject to errors including those of syntax and "sound alike "   substitutions which may escape proof reading.  In such instances, original    meaning may be extrapolated by contextual derivation.         Radiologist location ID: [id number]         XR AP MOBILE CHEST   Final Result by Edi, Radresults In (02/19 1537)   Mild interstitial edema. Possibly a cardiac etiology, but,   lymphangitic metastatic disease could also look like this. Clinical   correlation is advised.      This note was partially created using voice recognition software and is   inherently subject to errors including those of syntax and "sound alike "   substitutions which may escape proof reading.  In such instances, original  meaning may be extrapolated by contextual derivation.         Radiologist location ID: [id number]             Assessment/Plan:  Hospital Problems    Acute exacerbation of CHF (congestive heart failure) (CMS HCC)         Date Noted: 01/31/2020      CHF (congestive heart failure) (CMS HCC)         Date Noted: 04/13/2019      COPD (chronic obstructive pulmonary disease) (CMS HCC)         Date Noted: 04/13/2019      Hyperlipidemia         Date Noted: 04/13/2019      Type 2 diabetes mellitus (CMS [facility])         Date Noted: 01/24/2017      Resolved Hospital Problems  No resolved problems to display.      . Acute exacerbation of CHF.  Continue IV Lasix.  Continue to monitor urine output.  Repeat lab work in the morning.  Marland Kitchen COPD exacerbation.  Blood cultures are still pending.  Continue doxycycline Rocephin.  Continue Solu-Medrol IV.  Respiratory therapy for respiratory protocol.  Repeat chest x-ray in the morning.  Marland Kitchen Hyperlipidemia.  Chronic stable.  . Type 2 diabetes.  Diabetic diet.  Sliding scale insulin.  . DVT prophylaxis.  Eliquis.      Bethanie Dicker, MD

## 2020-02-03 NOTE — Nurses Notes (Signed)
Patient assisted up to bedside commode and up to chair without difficulty. Patient denies any needs at this time. Will continue to monitor. Call light within reach. Dalia Heading, RN  02/03/2020, 14:17

## 2020-02-03 NOTE — Nurses Notes (Signed)
Patient sitting up in bed watching TV. Patient requesting assistance with ambulating in hallway today. Patient advised physician has ordered, a consultation with PT/OT today. Will continue to monitor. Call light within reach. Dalia Heading, RN  02/03/2020, 11:35

## 2020-02-03 NOTE — Respiratory Therapy (Signed)
Patient having shortness of breath and asking for oxygen , placed patient on 1 lpm nc

## 2020-02-04 ENCOUNTER — Inpatient Hospital Stay (HOSPITAL_COMMUNITY): Payer: Medicare Other

## 2020-02-04 LAB — BASIC METABOLIC PANEL
ANION GAP: 11 mmol/L (ref 4–13)
BUN/CREA RATIO: 32 — ABNORMAL HIGH (ref 6–22)
BUN: 33 mg/dL — ABNORMAL HIGH (ref 8–25)
CALCIUM: 7.6 mg/dL — ABNORMAL LOW (ref 8.8–10.2)
CHLORIDE: 95 mmol/L — ABNORMAL LOW (ref 96–111)
CO2 TOTAL: 23 mmol/L (ref 23–31)
CREATININE: 1.04 mg/dL (ref 0.60–1.05)
ESTIMATED GFR: 54 mL/min/BSA — ABNORMAL LOW (ref >=60)
GLUCOSE: 230 mg/dL — ABNORMAL HIGH (ref 65–125)
POTASSIUM: 3.7 mmol/L (ref 3.5–5.1)
SODIUM: 129 mmol/L — ABNORMAL LOW (ref 136–145)

## 2020-02-04 LAB — CBC WITH DIFF
BASOPHIL #: <0.1 10*3/uL (ref <=0.20)
BASOPHIL %: 0 %
EOSINOPHIL #: <0.1 10*3/uL (ref <=0.50)
EOSINOPHIL %: 0 %
HCT: 26.5 % — ABNORMAL LOW (ref 34.8–46.0)
HGB: 8.5 g/dL — ABNORMAL LOW (ref 11.5–16.0)
IMMATURE GRANULOCYTE #: <0.1 10*3/uL (ref <0.10)
IMMATURE GRANULOCYTE %: 1 % (ref 0–1)
LYMPHOCYTE #: 0.2 10*3/uL — ABNORMAL LOW (ref 1.00–4.80)
LYMPHOCYTE %: 5 %
MCH: 31.4 pg (ref 26.0–32.0)
MCHC: 32.1 g/dL (ref 31.0–35.5)
MCV: 97.8 fL (ref 78.0–100.0)
MONOCYTE #: 0.1 10*3/uL — ABNORMAL LOW (ref 0.20–1.10)
MONOCYTE %: 3 %
MPV: 12.1 fL (ref 8.7–12.5)
NEUTROPHIL #: 3.54 10*3/uL (ref 1.50–7.70)
NEUTROPHIL %: 91 %
PLATELETS: 164 10*3/uL (ref 150–400)
RBC: 2.71 10*6/uL — ABNORMAL LOW (ref 3.85–5.22)
RDW-CV: 17.8 % — ABNORMAL HIGH (ref 11.5–15.5)
WBC: 3.9 10*3/uL (ref 3.7–11.0)

## 2020-02-04 LAB — POC BLOOD GLUCOSE (RESULTS)
GLUCOSE, POC: 274 mg/dL — ABNORMAL HIGH (ref 60–110)
GLUCOSE, POC: 272 mg/dL — ABNORMAL HIGH (ref 60–110)
GLUCOSE, POC: 330 mg/dL — ABNORMAL HIGH (ref 60–110)

## 2020-02-04 NOTE — Care Plan (Signed)
Problem: Fall Injury Risk  Goal: Absence of Fall and Fall-Related Injury  Outcome: Ongoing (see interventions/notes)    Patient will remain free of falls during this shift.   Goal met.  Dalia Heading, RN  02/04/2020, 18:26

## 2020-02-04 NOTE — Respiratory Therapy (Signed)
Patient 's 02 sat 95% on room air today.

## 2020-02-04 NOTE — Respiratory Therapy (Signed)
Patient is on RA. No distress noted.

## 2020-02-04 NOTE — PT Treatment (Signed)
[facility]  Rehabilitation Services  Physical Therapy Progress Note    Patient Name: [name]  Date of Birth: Aug 13, 1946  Height:  157.5 cm (5' 2.01")  Weight:  87.6 kg (193 lb 3 oz)  Room/Bed: 1136/B  Payor: MEDICARE / Plan: MEDICARE PART A AND B / Product Type: Medicare /     Assessment:     Pt was able to complete lap around unit and walk down to coffee shop and back with multiple rest breaks. Pt voiced that it made it more difficult where her legs had so much fluid and she felt SOB at times, (assessed O2 throughout session and it remained 97-98% on 3L nc).   Helped place pt back into bed with minA getting legs onto bed and comfortable. Made sure call light and table were left within reach, will continue to progress as tolerated        Plan:   Continue to follow patient according to established plan of care.  The risks/benefits of therapy have been discussed with the patient/caregiver and he/she is in agreement with the established plan of care.     Subjective & Objective:     Checked on pt this am and she voiced that she wanted to rest;  Went back after lunch and she was agreeable to ambulate, she voiced that she wasn't feeling the best today and had a bad headache          02/04/20 1510   PT Last Visit   PT Received On 02/04/20   Activity Tolerance   Endurance Tolerates 30 min exercise with multiple rests   Bed Mobility   Did the resident have bed mobility? Yes   Did the resident need help moving in bed? Setup help only   Did you supervise the resident moving in bed? I supervised the activity   How much help did the resident need moving in bed? Limited help   Transfer   Did the resident transfer? Yes   Did the resident need help to transfer? Setup help only   Did you supervise the resident transferring? I supervised the activity   How much help did the resident need to transfer? Non-weight-bearing help   Walk in Room   Did the resident walk in the room? Yes   Did the resident  need help walking in the room? Physical help from one person   Did you supervise the resident walking in the room? I supervised the activity   How much help did the resident need walking in the room? Non-weight-bearing help   Walk in [place]   Did the resident walk in the hall? Yes   Did the resident need help walking in the hall? Physical help from one person   Did you supervise the resident walking in the hall? I supervised the activity   How much help did the resident need walking in the hall? Non-weight-bearing help   Locomotion on Unit   Did the resident locomote on unit? Yes   Did the resident need help locomoting on unit? Physical help from one person   Did you supervise the resident locomoting on unit? I supervised the activity   How much help did the resident need locomoting on unit? Non-weight-bearing help   Bed Mobility   Bed Mobility Yes   Bed Mobility 1   Bed Mobility From 1 Supine   Bed Mobility Type 1 To and from   Bed Mobility to 1 Short sit   Level  of Assistance 1 Minimum assistance   Transfers   Transfer Yes   Transfer 1   Transfer From 1 Bed   Transfer Type 1 To and from   Transfer to 1 Stand;Chair with arms   Technique 1 Stand to sit;Sit to stand;Stand pivot   Transfer Device 1   (FWW)   Transfer Level of Assistance 1 Minimum assistance   Ambulation   Ambulation Yes   Ambulation 1   Surface 1 Level tile   Device 1 Rolling walker   Other Apparatus 1 Other (Comment)  (geri chair follow)   Assistance 1 Contact guard   Quality of Gait 1   (very slow cadence)   Comments/Distance (ft) 1   (187, 172, 118, 169ft)       Therapist:   Primitivo Gauze

## 2020-02-04 NOTE — Care Plan (Signed)
Problem: Fluid Volume Excess  Goal: Fluid Balance  Outcome: Ongoing (see interventions/notes)  Intervention: Monitor and Manage Hypervolemia  Flowsheets (Taken 02/04/2020 0744)  Fluid/Electrolyte Management:   fluids adjusted   oral rehydration therapy initiated             * lasix as ordered by physician              * monitor I&O

## 2020-02-04 NOTE — Progress Notes (Signed)
[name]  Hospitalist  Progress Note    Date of Service:  02/04/2020  [name], [name], 74 y.o. female  Date of Admission:  01/31/2020  Date of Birth:  Apr 11, 1946  PCP: [name] Buddy, MD    HPI:    Patient admitted to the hospital with acute exacerbation of CHF.  Patient continues to slowly improve.  Patient is having good diuresis.  She is on Rocephin and doxycycline for COPD exacerbation she is tolerating well.  She has no new complaints today.  No new concerns per nursing.    acetaminophen (TYLENOL) tablet, 650 mg, Oral, Q4H PRN  aluminum-magnesium hydroxide-simethicone (MAG-AL PLUS) 200-200-20 mg per 5 mL oral liquid, 20 mL, Oral, Q4H PRN  apixaban (ELIQUIS) tablet, 2.5 mg, Oral, 2x/day  aspirin (ECOTRIN) enteric coated tablet 81 mg, 81 mg, Oral, Daily  atorvastatin (LIPITOR) tablet, 10 mg, Oral, QPM  cefTRIAXone (ROCEPHIN) 1 g in NS 100 mL IVPB, 1 g, Intravenous, Q24H  dextrose 50% (0.5 g/mL) injection - syringe, 12.5 g, Intravenous, Q15 Min PRN  dilTIAZem (CARDIZEM) tablet, 30 mg, Oral, 4x/day  doxycycline hyclate 100 mg in NS 100 mL IVPB, 100 mg, Intravenous, Q12H  furosemide (LASIX) 10 mg/mL injection, 40 mg, Intravenous, 2x/day  heparin flush PF (HEPLOCK) 100 units/mL injection, 5 mL, Intracatheter, Q12H PRN  hydrALAZINE (APRESOLINE) tablet, 10 mg, Oral, Q8HRS  HYDROcodone-acetaminophen (NORCO) 5-325 mg per tablet, 1 Tablet, Oral, Q4H PRN  insulin glargine 100 units/mL injection, 10 Units, Subcutaneous, NIGHTLY  loratadine (CLARITIN) tablet, 10 mg, Oral, Daily  losartan (COZAAR) tablet, 50 mg, Oral, Daily  magnesium hydroxide (MILK OF MAGNESIA) 400mg  per 59mL oral liquid, 15 mL, Oral, Daily PRN  magnesium oxide (MAG-OX) tablet, 400 mg, Oral, 2x/day  methylPREDNISolone sod succ (SOLU-MEDROL) 125 mg/2 mL injection, 62.5 mg, Intravenous, Q6H  metoprolol succinate (TOPROL-XL) 24 hr extended release tablet, 25 mg, Oral, Daily  NS bolus infusion 50 mL, 50 mL, Intravenous, Give in  Radiology  NS flush syringe, 10 mL, Intracatheter, Q8HRS  NS flush syringe, 10 mL, Intracatheter, Q1H PRN  nystatin (NYSTOP) 100,000 units/g topical powder, , Apply Topically, 2x/day  ondansetron (ZOFRAN) 2 mg/mL injection, 4 mg, Intravenous, Q6H PRN  pancreatic enzyme replacement (CREON) 12,000 units lipase per cap, 1 Capsule, Oral, 3x/day-Meals  pantoprazole (PROTONIX) delayed release tablet, 40 mg, Oral, Daily  SSIP insulin lispro (HUMALOG) 100 units/mL injection, 0-12 Units, Subcutaneous, 4x/day PRN         Allergies   Allergen Reactions   . Sulfa (Sulfonamides) Itching             Filed Vitals:    02/04/20 0736 02/04/20 1058 02/04/20 1437 02/04/20 1523   BP:  (!) 149/59  (!) 113/47   Pulse:  69  83   Resp:  20 20 20    Temp:  36.4 C (97.6 F)  36.4 C (97.5 F)   SpO2: 99%          Physical Exam  HENT:      Head: Normocephalic and atraumatic.   Eyes:      Conjunctiva/sclera: Conjunctivae normal.      Pupils: Pupils are equal, round, and reactive to light.   Cardiovascular:      Rate and Rhythm: Normal rate and regular rhythm.      Heart sounds: Normal heart sounds. No murmur. No friction rub. No gallop.    Pulmonary:      Effort: No respiratory distress.      Breath sounds: Rales present. No wheezing.  Abdominal:      General: Bowel sounds are normal. There is no distension.      Palpations: Abdomen is soft. There is no mass.      Tenderness: There is no abdominal tenderness. There is no guarding or rebound.   Musculoskeletal:         General: No deformity. Normal range of motion.      Cervical back: Normal range of motion and neck supple.      Right lower leg: 3+ Pitting Edema present.      Left lower leg: 3+ Pitting Edema present.   Skin:     General: Skin is warm and dry.      Findings: No erythema or rash.   Neurological:      Mental Status: She is alert and oriented to person, place, and time.      Cranial Nerves: No cranial nerve deficit.   Psychiatric:         Mood and Affect: Affect normal.          Cognition and Memory: Memory normal.         Laboratory Data:     Results for orders placed or performed during the hospital encounter of 01/31/20 (from the past 24 hour(s))   BASIC METABOLIC PANEL   Result Value Ref Range    SODIUM 129 (L) 136 - 145 mmol/L    POTASSIUM 3.7 3.5 - 5.1 mmol/L    CHLORIDE 95 (L) 96 - 111 mmol/L    CO2 TOTAL 23 23 - 31 mmol/L    ANION GAP 11 4 - 13 mmol/L    CALCIUM 7.6 (L) 8.8 - 10.2 mg/dL    GLUCOSE 230 (H) 65 - 125 mg/dL    BUN 33 (H) 8 - 25 mg/dL    CREATININE 1.04 0.60 - 1.05 mg/dL    BUN/CREA RATIO 32 (H) 6 - 22    ESTIMATED GFR 54 (L) >=60 mL/min/BSA   CBC WITH DIFF   Result Value Ref Range    WBC 3.9 3.7 - 11.0 x10^3/uL    RBC 2.71 (L) 3.85 - 5.22 x10^6/uL    HGB 8.5 (L) 11.5 - 16.0 g/dL    HCT 26.5 (L) 34.8 - 46.0 %    MCV 97.8 78.0 - 100.0 fL    MCH 31.4 26.0 - 32.0 pg    MCHC 32.1 31.0 - 35.5 g/dL    RDW-CV 17.8 (H) 11.5 - 15.5 %    PLATELETS 164 150 - 400 x10^3/uL    MPV 12.1 8.7 - 12.5 fL    NEUTROPHIL % 91 %    LYMPHOCYTE % 5 %    MONOCYTE % 3 %    EOSINOPHIL % 0 %    BASOPHIL % 0 %    NEUTROPHIL # 3.54 1.50 - 7.70 x10^3/uL    LYMPHOCYTE # 0.20 (L) 1.00 - 4.80 x10^3/uL    MONOCYTE # 0.10 (L) 0.20 - 1.10 x10^3/uL    EOSINOPHIL # <0.10 <=0.50 x10^3/uL    BASOPHIL # <0.10 <=0.20 x10^3/uL    IMMATURE GRANULOCYTE % 1 0 - 1 %    IMMATURE GRANULOCYTE # <0.10 <0.10 x10^3/uL   POC BLOOD GLUCOSE (RESULTS)   Result Value Ref Range    GLUCOSE, POC 274 (H) 60 - 110 mg/dl   POC BLOOD GLUCOSE (RESULTS)   Result Value Ref Range    GLUCOSE, POC 272 (H) 60 - 110 mg/dl       Imaging Studies:    XR  AP MOBILE CHEST   Final Result by Edi, Radresults In (02/23 1415)   Possibly a slight improvement.      This note was partially created using voice recognition software and is   inherently subject to errors including those of syntax and "sound alike "   substitutions which may escape proof reading.  In such instances, original   meaning may be extrapolated by contextual derivation.          Radiologist location ID: [id number]         XR AP MOBILE CHEST   Final Result by Edi, Radresults In (02/22 1008)   Slight deterioration.      This note was partially created using voice recognition software and is   inherently subject to errors including those of syntax and "sound alike "   substitutions which may escape proof reading.  In such instances, original   meaning may be extrapolated by contextual derivation.         Radiologist location ID: [id number]         XR AP MOBILE CHEST   Final Result by Edi, Radresults In (02/21 1010)   Similar to prior study      This note was partially created using voice recognition software and is   inherently subject to errors including those of syntax and "sound alike "   substitutions which may escape proof reading.  In such instances, original   meaning may be extrapolated by contextual derivation.         Radiologist location ID: [id number]         XR AP MOBILE CHEST   Final Result by Edi, Radresults In (02/20 1038)   Bilateral effusions with interstitial prominence. Consider   congestive failure. Follow-up suggested      This note was partially created using voice recognition software and is   inherently subject to errors including those of syntax and "sound alike "   substitutions which may escape proof reading.  In such instances, original   meaning may be extrapolated by contextual derivation.         Radiologist location ID: [id number]         CT ANGIO CHEST W IV CONTRAST   Final Result by Edi, Radresults In (02/19 1556)   Negative for pulmonary embolism. Negative for aortic   dissection.      Acute congestive heart failure. Interstitial edema and bilateral pleural   effusions.      Questionable anasarca. Appear to be some edematous type findings in the   soft tissues surrounding the chest.         The CT exam was performed using one or more the following a dose reduction   techniques: Automated exposure control, adjustment of the mA and/or kV   according to the patient's  size, or use of iterative reconstruction   technique.      This note was partially created using voice recognition software and is   inherently subject to errors including those of syntax and "sound alike "   substitutions which may escape proof reading.  In such instances, original   meaning may be extrapolated by contextual derivation.         Radiologist location ID: [id number]         XR AP MOBILE CHEST   Final Result by Edi, Radresults In (02/19 1537)   Mild interstitial edema. Possibly a cardiac etiology, but,   lymphangitic metastatic disease could also look like this. Clinical  correlation is advised.      This note was partially created using voice recognition software and is   inherently subject to errors including those of syntax and "sound alike "   substitutions which may escape proof reading.  In such instances, original   meaning may be extrapolated by contextual derivation.         Radiologist location ID: [id number]             Assessment/Plan:  Hospital Problems    Acute exacerbation of CHF (congestive heart failure) (CMS HCC)         Date Noted: 01/31/2020      CHF (congestive heart failure) (CMS HCC)         Date Noted: 04/13/2019      COPD (chronic obstructive pulmonary disease) (CMS HCC)         Date Noted: 04/13/2019      Hyperlipidemia         Date Noted: 04/13/2019      Type 2 diabetes mellitus (CMS [facility])         Date Noted: 01/24/2017      Resolved Hospital Problems  No resolved problems to display.      . Acute exacerbation of CHF.  Continue IV Lasix.  Monitor urine output.  Will repeat lab work in the morning.  Marland Kitchen COPD exacerbation.  Blood cultures are pending.  Continue IV antibiotics.  Continue IV Solu-Medrol.  Continue respiratory therapy for respiratory protocol.  Repeat chest x-ray and labs in the morning.  Marland Kitchen Hyperlipidemia.  Chronic stable.  . Type 2 diabetes.  Diabetic diet.  Sliding scale insulin.  . DVT prophylaxis.  Eliquis.    Bethanie Dicker, MD

## 2020-02-05 ENCOUNTER — Inpatient Hospital Stay (HOSPITAL_COMMUNITY): Payer: Medicare Other

## 2020-02-05 LAB — CBC WITH DIFF
BASOPHIL #: <0.1 10*3/uL (ref <=0.20)
BASOPHIL %: 0 %
EOSINOPHIL #: <0.1 10*3/uL (ref <=0.50)
EOSINOPHIL %: 0 %
HCT: 28 % — ABNORMAL LOW (ref 34.8–46.0)
HGB: 8.9 g/dL — ABNORMAL LOW (ref 11.5–16.0)
IMMATURE GRANULOCYTE #: <0.1 10*3/uL (ref <0.10)
IMMATURE GRANULOCYTE %: 1 % (ref 0–1)
LYMPHOCYTE #: 0.2 10*3/uL — ABNORMAL LOW (ref 1.00–4.80)
LYMPHOCYTE %: 4 %
MCH: 31.2 pg (ref 26.0–32.0)
MCHC: 31.8 g/dL (ref 31.0–35.5)
MCV: 98.2 fL (ref 78.0–100.0)
MONOCYTE #: 0.12 10*3/uL — ABNORMAL LOW (ref 0.20–1.10)
MONOCYTE %: 2 %
MPV: 11.2 fL (ref 8.7–12.5)
NEUTROPHIL #: 4.85 10*3/uL (ref 1.50–7.70)
NEUTROPHIL %: 93 %
PLATELETS: 156 10*3/uL (ref 150–400)
RBC: 2.85 10*6/uL — ABNORMAL LOW (ref 3.85–5.22)
RDW-CV: 17.8 % — ABNORMAL HIGH (ref 11.5–15.5)
WBC: 5.2 10*3/uL (ref 3.7–11.0)

## 2020-02-05 LAB — COMPREHENSIVE METABOLIC PANEL, NON-FASTING
ALBUMIN: 2 g/dL — ABNORMAL LOW (ref 3.4–4.8)
ALKALINE PHOSPHATASE: 72 U/L (ref 55–145)
ALT (SGPT): 13 U/L (ref 8–22)
ANION GAP: 8 mmol/L (ref 4–13)
AST (SGOT): 17 U/L (ref 8–45)
BILIRUBIN TOTAL: 0.2 mg/dL — ABNORMAL LOW (ref 0.3–1.3)
BUN/CREA RATIO: 30 — ABNORMAL HIGH (ref 6–22)
BUN: 31 mg/dL — ABNORMAL HIGH (ref 8–25)
CALCIUM: 7.7 mg/dL — ABNORMAL LOW (ref 8.8–10.2)
CHLORIDE: 96 mmol/L (ref 96–111)
CO2 TOTAL: 24 mmol/L (ref 23–31)
CREATININE: 1.02 mg/dL (ref 0.60–1.05)
ESTIMATED GFR: 55 mL/min/BSA — ABNORMAL LOW (ref >=60)
GLUCOSE: 202 mg/dL — ABNORMAL HIGH (ref 65–125)
POTASSIUM: 3.4 mmol/L — ABNORMAL LOW (ref 3.5–5.1)
PROTEIN TOTAL: 4.3 g/dL — ABNORMAL LOW (ref 6.0–8.0)
SODIUM: 128 mmol/L — ABNORMAL LOW (ref 136–145)

## 2020-02-05 LAB — POC BLOOD GLUCOSE (RESULTS)
GLUCOSE, POC: 242 mg/dL — ABNORMAL HIGH (ref 60–110)
GLUCOSE, POC: 259 mg/dL — ABNORMAL HIGH (ref 60–110)
GLUCOSE, POC: 274 mg/dL — ABNORMAL HIGH (ref 60–110)
GLUCOSE, POC: 269 mg/dL — ABNORMAL HIGH (ref 60–110)

## 2020-02-05 NOTE — Nurses Notes (Signed)
Pt complaining of mid sternal chest pain K.Gillespie PA notified vs T 98.1 P68 R16 164/63. Will continue to monitor.

## 2020-02-05 NOTE — Care Plan (Signed)
[facility]  ACUTE: Rehabilitation Services  Occupational Therapy Initial Evaluation    Patient name:  [name]  Date of birth   15-Feb-1946  MRN:   [id number]  Payor:   Payor: MEDICARE / Plan: MEDICARE PART A AND B / Product Type: Medicare /   Referring provider:  No ref. provider found    Date of Evaluation: 02/05/2020    Medical History related to diagnosis:  Past Medical History:   Diagnosis Date    Abdominal hernia 10/18/2017    hx of repair    Anxiety     Arthritis     Asthma     Atrial fibrillation (CMS HCC)     Back problem     Bruises easily     Cancer (CMS [place]) 10/18/2017    chemo and radiation completed 09/24/2017    COPD (chronic obstructive pulmonary disease) (CMS HCC)     CPAP (continuous positive airway pressure) dependence 10/18/2017    has not used recently    Depression     DM (diabetes mellitus) (CMS [place]) 2000    Fasting BG 200's. HGA1C 2018 6    Edema     Essential hypertension     GERD (gastroesophageal reflux disease)     Headache     Heartburn     Hx antineoplastic chemo 2018    Hx of radiation therapy 2018    Hypercholesteremia     Hyperlipidemia     Migraine 10/18/2017    none recently    Obesity     Palpitations     Pancreatic cancer (CMS [place]) 03/2017    Panic attack     PE (pulmonary thromboembolism) (CMS HCC) 03/29/2018    Peripheral neuropathy 10/18/2017    knees down, bilateral    Sleep apnea     Type 2 diabetes mellitus (CMS [place]) 10/18/2017    Dx 2000 FBS 200s    Unintentional weight loss 10/18/2017    25 lbs 03/2017    UTI (urinary tract infection)     Wears glasses            Past Surgical History:   Procedure Laterality Date    CESAREAN SECTION      COLONOSCOPY  10/04/2019    Diverticuli a colonoscopy by Dr. Tonye Becket    HX CESAREAN SECTION      HX CHOLECYSTECTOMY      HX HERNIA REPAIR      HX SUBCLAVIAN PORT IMPLANTION      s/p removed    HX SUBCLAVIAN PORT IMPLANTION      HX TONSIL AND ADENOIDECTOMY      HX TONSILLECTOMY      PANCREATICODUODENECTOMY   9528    UMBILICAL HERNIA REPAIR      x2    WHIPPLE PROCEDURE W/ LAPAROSCOPY         SUBJECTIVE   "I am pretty weak."    OBJECTIVE       Pt admitted with exacerbation of CHF and COPD. She is in acute care with orders for OT eval and tx.     02/05/20 0700   Rehab Session   Document Type evaluation   Patient Effort good   General Information   Patient Profile Reviewed yes   Respiratory Status nasal cannula   Living Environment   Lives With sibling(s);child(ren), adult  (trades between younger sister and son)   Living Arrangements house   Functional Level Prior   Ambulation 1 - assistive equipment  Transferring 1 - assistive equipment   Toileting 1 - assistive equipment   Bathing 0 - independent   Dressing 0 - independent   Eating 0 - independent   Communication 0 - understands/communicates without difficulty   Swallowing 0-->swallows foods/liquids without difficulty   Self-Care   Dominant Hand right   Current Activity Tolerance fair   Pain Assessment   Additional Documentation   (no reports of pain)   Coping/Psychosocial   Observed Emotional State calm;pleasant;cooperative   Coping/Psychosocial Response Interventions   Plan Of Care Reviewed With patient   Cognitive Assessment/Interventions   Behavior/Mood Observations behavior appropriate to situation, WNL/WFL   Orientation Status oriented x 4   Attention WNL/WFL   Follows Commands WFL   RUE Assessment   RUE Assessment X- Exceptions   RUE ROM wfl   RUE Strength 3+/5   LUE Assessment   LUE Assessment X-Exceptions   LUE ROM wfl   LUE Strength 3+/5   Transfer Assessment/Treatment   Toilet Transfer Independence minimum assist (75% patient effort)   Bathing Assessment/Training   Independence Level minimum assist (75% patient effort)   Upper Body Dressing Assessment/Training   Independence Level  contact guard assist   Lower Body Dressing Assessment/Training   Independence Level  minimum assist (75% patient effort)   Toileting Assessment/Training   Independence Level   contact guard assist   Grooming Assessment/Training   Independence Level set up required   Self-Feeding Assessment/Training   Independence Level set up required   Planned Therapy Interventions, OT Eval   Planned Therapy Interventions ADL retraining;endurance training;transfer training;strengthening  (ther ex, ther act)   Clinical Impression   OT Diagnosis weakness   Prognosis  good for goals.   Patient/Family Goals Statement Return home [place]   Criteria for Skilled Therapeutic Interventions Met (OT) yes;meets criteria;skilled treatment is necessary   Rehab Potential good, to achieve stated therapy goals   Therapy Frequency minimum of 5x/week   Predicted Duration of Therapy until discharge   Precautions: O2, falls    ASSESSMENT     Problem list:  ue weakness, decreased endurance; decline in ADL/funct xfers  Prognosis:  good for stated goals. Pt would benefit from skilled OT intervention to maximize I prior to d/c.     GOALS   While in acute care (In 1 week) pt will:  Increase bue strength/endurance to 4/5 and 30 min prn for ADL/funct mobs  Perform Am ADL with S to increase I with ADL  Perform toileting and toilet xfer with mod I to increase I with funct mob/ADL    PLAN OF CARE   OT services for 5 times/week until d/c to maximize I prior to d/c. Collaborated with pt and she agreed with POC. Left written POC for treating COTAs.     The risks/benefits of therapy have been discussed with the patient and he/she is in agreement with the established plan of care.    [place], OTR/L #[id number]  02/05/2020  08:33    By signing this, I certify that I have read and agree with the therapists recommendations.  This will serve as a new order for the above patient.  Any changes are noted in writing on this paper.    ___________________________________________________________________  [id number] Signature/Date          Bethanie Dicker, MD  02/05/2020, 20:29

## 2020-02-05 NOTE — Respiratory Therapy (Signed)
Patient 's 02 sat 92% on 2 lpm nc.

## 2020-02-05 NOTE — Care Plan (Signed)
Problem: Fluid Volume Excess  Goal: Fluid Balance  Outcome: Ongoing (see interventions/notes)   Intervention: Keep legs elevated while in bed and geri chair.  Accurate I and O.  Evaluation: Pt lower ext continue to be edematous 4+.  Pt is receiving IV Lasix

## 2020-02-05 NOTE — PT Treatment (Signed)
[facility]  Rehabilitation Services  Physical Therapy Progress Note    Patient Name: [name]  Date of Birth: Apr 24, 1946  Height:  157.5 cm (5' 2.01")  Weight:  96.1 kg (211 lb 12.8 oz)  Room/Bed: 1136/B  Payor: MEDICARE / Plan: MEDICARE PART A AND B / Product Type: Medicare /     Assessment:   Pt was able to walk 75ft total today with CGA/SBA, and was agreeable to do seated therex as listed below.  (BLE's appeared more swollen today as compared to yesterday).   O2 remained around 96-97% on 2L nc during session,  She was placed reclined in gerichair with table and call light within . Will continue to progress as tolerated      Plan:   Continue to follow patient according to established plan of care.  The risks/benefits of therapy have been discussed with the patient/caregiver and he/she is in agreement with the established plan of care.     Subjective & Objective:     Pt was supine in bed upon arrival and voiced that she was feeling better than she was earlier this am. She is agreeable to participate with PT services         02/05/20 1500   PT Last Visit   PT Received On 02/05/20   Activity Tolerance   Endurance Tolerates 30 min exercise with multiple rests   Bed Mobility   Did the resident have bed mobility? Yes   Did the resident need help moving in bed? Setup help only   Did you supervise the resident moving in bed? I supervised the activity   How much help did the resident need moving in bed? Limited help   Transfer   Did the resident transfer? Yes   Did the resident need help to transfer? Setup help only   Did you supervise the resident transferring? I supervised the activity   How much help did the resident need to transfer? Non-weight-bearing help   Walk in Room   Did the resident walk in the room? Yes   Did the resident need help walking in the room? Setup help only   Did you supervise the resident walking in the room? I supervised the activity   How much help did the  resident need walking in the room? Non-weight-bearing help   Walk in [place]   Did the resident walk in the hall? Yes   Did the resident need help walking in the hall? Physical help from one person   Did you supervise the resident walking in the hall? I supervised the activity   How much help did the resident need walking in the hall? Non-weight-bearing help   Locomotion on Unit   Did the resident locomote on unit? Yes   Did the resident need help locomoting on unit? Physical help from one person   Did you supervise the resident locomoting on unit? I supervised the activity   How much help did the resident need locomoting on unit? Non-weight-bearing help   Seated   Seated-Exercises Lower extremity   Seated-Exercise Type Hip flexion;Long arc quads;ADduction;Knee flex   Repetitions   (3x10)   Weight/Resistance 1 pound;red   Bed Mobility   Bed Mobility Yes   Bed Mobility 1   Bed Mobility From 1 Supine   Bed Mobility Type 1 To   Bed Mobility to 1 Short sit   Level of Assistance 1 Minimum assistance   Transfers   Transfer Yes  Transfer 1   Transfer From 1 Bed   Transfer Type 1 To   Transfer to 1 Stand;Chair with arms   Technique 1 Stand to sit;Sit to stand;Stand pivot   Transfer Device 1   (FWW)   Transfer Level of Assistance 1 Contact guard   Ambulation   Ambulation Yes   Ambulation 1   Surface 1 Level tile   Device 1 Rolling walker   Other Apparatus 1 Other (Comment)  (geri chair follow)   Assistance 1 Contact guard   Quality of Gait 1   (slow paced, safe)   Comments/Distance (ft) 1   (183, 212, 275, 73ft)       Therapist:   Primitivo Gauze

## 2020-02-05 NOTE — Progress Notes (Signed)
[name]  Hospitalist  Progress Note    Date of Service:  02/05/2020  [name], [name], 74 y.o. female  Date of Admission:  01/31/2020  Date of Birth:  12-02-1946  PCP: Myrene Buddy, MD    HPI:    Patient admitted to the hospital with an acute exacerbation of CHF.  The patient continues to improve slowly.  The patient is having excellent diuresis with IV Lasix.  Patient has been started on doxycycline and Rocephin for her COPD exacerbation.  The patient has no new complaints.  She is working with physical therapy.    acetaminophen (TYLENOL) tablet, 650 mg, Oral, Q4H PRN  aluminum-magnesium hydroxide-simethicone (MAG-AL PLUS) 200-200-20 mg per 5 mL oral liquid, 20 mL, Oral, Q4H PRN  apixaban (ELIQUIS) tablet, 2.5 mg, Oral, 2x/day  aspirin (ECOTRIN) enteric coated tablet 81 mg, 81 mg, Oral, Daily  atorvastatin (LIPITOR) tablet, 10 mg, Oral, QPM  cefTRIAXone (ROCEPHIN) 1 g in NS 100 mL IVPB, 1 g, Intravenous, Q24H  dextrose 50% (0.5 g/mL) injection - syringe, 12.5 g, Intravenous, Q15 Min PRN  dilTIAZem (CARDIZEM) tablet, 30 mg, Oral, 4x/day  doxycycline hyclate 100 mg in NS 100 mL IVPB, 100 mg, Intravenous, Q12H  furosemide (LASIX) 10 mg/mL injection, 40 mg, Intravenous, 2x/day  heparin flush PF (HEPLOCK) 100 units/mL injection, 5 mL, Intracatheter, Q12H PRN  hydrALAZINE (APRESOLINE) tablet, 10 mg, Oral, Q8HRS  HYDROcodone-acetaminophen (NORCO) 5-325 mg per tablet, 1 Tablet, Oral, Q4H PRN  insulin glargine 100 units/mL injection, 10 Units, Subcutaneous, NIGHTLY  loratadine (CLARITIN) tablet, 10 mg, Oral, Daily  losartan (COZAAR) tablet, 50 mg, Oral, Daily  magnesium hydroxide (MILK OF MAGNESIA) 400mg  per 76mL oral liquid, 15 mL, Oral, Daily PRN  magnesium oxide (MAG-OX) tablet, 400 mg, Oral, 2x/day  methylPREDNISolone sod succ (SOLU-MEDROL) 125 mg/2 mL injection, 62.5 mg, Intravenous, Q6H  metoprolol succinate (TOPROL-XL) 24 hr extended release tablet, 25 mg, Oral, Daily  NS bolus infusion 50  mL, 50 mL, Intravenous, Give in Radiology  NS flush syringe, 10 mL, Intracatheter, Q8HRS  NS flush syringe, 10 mL, Intracatheter, Q1H PRN  nystatin (NYSTOP) 100,000 units/g topical powder, , Apply Topically, 2x/day  ondansetron (ZOFRAN) 2 mg/mL injection, 4 mg, Intravenous, Q6H PRN  pancreatic enzyme replacement (CREON) 12,000 units lipase per cap, 1 Capsule, Oral, 3x/day-Meals  pantoprazole (PROTONIX) delayed release tablet, 40 mg, Oral, Daily  SSIP insulin lispro (HUMALOG) 100 units/mL injection, 0-12 Units, Subcutaneous, 4x/day PRN         Allergies   Allergen Reactions   . Sulfa (Sulfonamides) Itching             Filed Vitals:    02/05/20 1217 02/05/20 1359 02/05/20 1608 02/05/20 1908   BP: (!) 164/63  (!) 156/76 (!) 153/57   Pulse: 68  72 66   Resp: 16 16 16 18    Temp: 36.7 C (98.1 F)  36.6 C (97.9 F) 36.5 C (97.7 F)   SpO2:           Physical Exam  HENT:      Head: Normocephalic and atraumatic.   Eyes:      Conjunctiva/sclera: Conjunctivae normal.      Pupils: Pupils are equal, round, and reactive to light.   Cardiovascular:      Rate and Rhythm: Normal rate and regular rhythm.      Heart sounds: Normal heart sounds. No murmur. No friction rub. No gallop.    Pulmonary:      Effort: No respiratory distress.  Breath sounds: Rales present. No wheezing.   Abdominal:      General: Bowel sounds are normal. There is no distension.      Palpations: Abdomen is soft. There is no mass.      Tenderness: There is no abdominal tenderness. There is no guarding or rebound.   Musculoskeletal:         General: No deformity. Normal range of motion.      Cervical back: Normal range of motion and neck supple.      Right lower leg: 3+ Pitting Edema present.      Left lower leg: 3+ Pitting Edema present.   Skin:     General: Skin is warm and dry.      Findings: No erythema or rash.   Neurological:      Mental Status: She is alert and oriented to person, place, and time.      Cranial Nerves: No cranial nerve deficit.      Psychiatric:         Mood and Affect: Affect normal.         Cognition and Memory: Memory normal.         Laboratory Data:     Results for orders placed or performed during the hospital encounter of 01/31/20 (from the past 24 hour(s))   POC BLOOD GLUCOSE (RESULTS)   Result Value Ref Range    GLUCOSE, POC 242 (H) 60 - 110 mg/dl   COMPREHENSIVE METABOLIC PANEL, NON-FASTING   Result Value Ref Range    SODIUM 128 (L) 136 - 145 mmol/L    POTASSIUM 3.4 (L) 3.5 - 5.1 mmol/L    CHLORIDE 96 96 - 111 mmol/L    CO2 TOTAL 24 23 - 31 mmol/L    ANION GAP 8 4 - 13 mmol/L    BUN 31 (H) 8 - 25 mg/dL    CREATININE 1.02 0.60 - 1.05 mg/dL    BUN/CREA RATIO 30 (H) 6 - 22    ESTIMATED GFR 55 (L) >=60 mL/min/BSA    ALBUMIN 2.0 (L) 3.4 - 4.8 g/dL     CALCIUM 7.7 (L) 8.8 - 10.2 mg/dL    GLUCOSE 202 (H) 65 - 125 mg/dL    ALKALINE PHOSPHATASE 72 55 - 145 U/L    ALT (SGPT) 13 8 - 22 U/L    AST (SGOT)  17 8 - 45 U/L    BILIRUBIN TOTAL 0.2 (L) 0.3 - 1.3 mg/dL    PROTEIN TOTAL 4.3 (L) 6.0 - 8.0 g/dL   CBC WITH DIFF   Result Value Ref Range    WBC 5.2 3.7 - 11.0 x10^3/uL    RBC 2.85 (L) 3.85 - 5.22 x10^6/uL    HGB 8.9 (L) 11.5 - 16.0 g/dL    HCT 28.0 (L) 34.8 - 46.0 %    MCV 98.2 78.0 - 100.0 fL    MCH 31.2 26.0 - 32.0 pg    MCHC 31.8 31.0 - 35.5 g/dL    RDW-CV 17.8 (H) 11.5 - 15.5 %    PLATELETS 156 150 - 400 x10^3/uL    MPV 11.2 8.7 - 12.5 fL    NEUTROPHIL % 93 %    LYMPHOCYTE % 4 %    MONOCYTE % 2 %    EOSINOPHIL % 0 %    BASOPHIL % 0 %    NEUTROPHIL # 4.85 1.50 - 7.70 x10^3/uL    LYMPHOCYTE # 0.20 (L) 1.00 - 4.80 x10^3/uL    MONOCYTE # 0.12 (L)  0.20 - 1.10 x10^3/uL    EOSINOPHIL # <0.10 <=0.50 x10^3/uL    BASOPHIL # <0.10 <=0.20 x10^3/uL    IMMATURE GRANULOCYTE % 1 0 - 1 %    IMMATURE GRANULOCYTE # <0.10 <0.10 x10^3/uL   POC BLOOD GLUCOSE (RESULTS)   Result Value Ref Range    GLUCOSE, POC 259 (H) 60 - 110 mg/dl   POC BLOOD GLUCOSE (RESULTS)   Result Value Ref Range    GLUCOSE, POC 274 (H) 60 - 110 mg/dl   POC BLOOD GLUCOSE (RESULTS)   Result  Value Ref Range    GLUCOSE, POC 269 (H) 60 - 110 mg/dl       Imaging Studies:    XR AP MOBILE CHEST   Final Result by Edi, Radresults In (02/24 1130)   Further improvement noted. Lingering minimal interstitial   edema.      This note was partially created using voice recognition software and is   inherently subject to errors including those of syntax and "sound alike "   substitutions which may escape proof reading.  In such instances, original   meaning may be extrapolated by contextual derivation.         Radiologist location ID: [id number]         XR AP MOBILE CHEST   Final Result by Edi, Radresults In (02/23 1415)   Possibly a slight improvement.      This note was partially created using voice recognition software and is   inherently subject to errors including those of syntax and "sound alike "   substitutions which may escape proof reading.  In such instances, original   meaning may be extrapolated by contextual derivation.         Radiologist location ID: [id number]         XR AP MOBILE CHEST   Final Result by Edi, Radresults In (02/22 1008)   Slight deterioration.      This note was partially created using voice recognition software and is   inherently subject to errors including those of syntax and "sound alike "   substitutions which may escape proof reading.  In such instances, original   meaning may be extrapolated by contextual derivation.         Radiologist location ID: [id number]         XR AP MOBILE CHEST   Final Result by Edi, Radresults In (02/21 1010)   Similar to prior study      This note was partially created using voice recognition software and is   inherently subject to errors including those of syntax and "sound alike "   substitutions which may escape proof reading.  In such instances, original   meaning may be extrapolated by contextual derivation.         Radiologist location ID: [id number]         XR AP MOBILE CHEST   Final Result by Edi, Radresults In (02/20 1038)   Bilateral effusions with  interstitial prominence. Consider   congestive failure. Follow-up suggested      This note was partially created using voice recognition software and is   inherently subject to errors including those of syntax and "sound alike "   substitutions which may escape proof reading.  In such instances, original   meaning may be extrapolated by contextual derivation.         Radiologist location ID: [id number]         CT ANGIO CHEST W IV CONTRAST  Final Result by Edi, Radresults In (02/19 1556)   Negative for pulmonary embolism. Negative for aortic   dissection.      Acute congestive heart failure. Interstitial edema and bilateral pleural   effusions.      Questionable anasarca. Appear to be some edematous type findings in the   soft tissues surrounding the chest.         The CT exam was performed using one or more the following a dose reduction   techniques: Automated exposure control, adjustment of the mA and/or kV   according to the patient's size, or use of iterative reconstruction   technique.      This note was partially created using voice recognition software and is   inherently subject to errors including those of syntax and "sound alike "   substitutions which may escape proof reading.  In such instances, original   meaning may be extrapolated by contextual derivation.         Radiologist location ID: [id number]         XR AP MOBILE CHEST   Final Result by Edi, Radresults In (02/19 1537)   Mild interstitial edema. Possibly a cardiac etiology, but,   lymphangitic metastatic disease could also look like this. Clinical   correlation is advised.      This note was partially created using voice recognition software and is   inherently subject to errors including those of syntax and "sound alike "   substitutions which may escape proof reading.  In such instances, original   meaning may be extrapolated by contextual derivation.         Radiologist location ID: [id number]             Assessment/Plan:  Hospital Problems    Acute  exacerbation of CHF (congestive heart failure) (CMS HCC)         Date Noted: 01/31/2020      CHF (congestive heart failure) (CMS HCC)         Date Noted: 04/13/2019      COPD (chronic obstructive pulmonary disease) (CMS HCC)         Date Noted: 04/13/2019      Hyperlipidemia         Date Noted: 04/13/2019      Type 2 diabetes mellitus (CMS [facility])         Date Noted: 01/24/2017      Resolved Hospital Problems  No resolved problems to display.      . Acute exacerbation of CHF.  Continue IV Lasix.  Continue to monitor urine output.  Repeat lab work in the morning.  Marland Kitchen COPD exacerbation.  Blood cultures are still pending.  Continue IV antibiotics.  Continue Solu-Medrol.  Continue respiratory for respiratory protocol.  . Hyperlipidemia.  Chronic stable.  . Type 2 diabetes.  Diabetic diet.  Sliding scale insulin.  . DVT prophylaxis.  Eliquis.    Bethanie Dicker, MD

## 2020-02-05 NOTE — Respiratory Therapy (Signed)
Patient doing well. No distress noted. Is on 2L NC. Will continue to monitor.

## 2020-02-06 ENCOUNTER — Encounter (INDEPENDENT_AMBULATORY_CARE_PROVIDER_SITE_OTHER): Payer: Self-pay | Admitting: FAMILY PRACTICE

## 2020-02-06 LAB — CBC WITH DIFF
BASOPHIL #: <0.1 10*3/uL (ref <=0.20)
BASOPHIL %: 0 %
EOSINOPHIL #: <0.1 10*3/uL (ref <=0.50)
EOSINOPHIL %: 0 %
HCT: 27.5 % — ABNORMAL LOW (ref 34.8–46.0)
HGB: 8.9 g/dL — ABNORMAL LOW (ref 11.5–16.0)
IMMATURE GRANULOCYTE #: <0.1 10*3/uL (ref <0.10)
IMMATURE GRANULOCYTE %: 1 % (ref 0–1)
LYMPHOCYTE #: 0.24 10*3/uL — ABNORMAL LOW (ref 1.00–4.80)
LYMPHOCYTE %: 4 %
MCH: 31.8 pg (ref 26.0–32.0)
MCHC: 32.4 g/dL (ref 31.0–35.5)
MCV: 98.2 fL (ref 78.0–100.0)
MONOCYTE #: 0.14 10*3/uL — ABNORMAL LOW (ref 0.20–1.10)
MONOCYTE %: 3 %
MPV: 11.9 fL (ref 8.7–12.5)
NEUTROPHIL #: 5.01 10*3/uL (ref 1.50–7.70)
NEUTROPHIL %: 92 %
PLATELETS: 157 10*3/uL (ref 150–400)
RBC: 2.8 10*6/uL — ABNORMAL LOW (ref 3.85–5.22)
RDW-CV: 17.6 % — ABNORMAL HIGH (ref 11.5–15.5)
WBC: 5.5 10*3/uL (ref 3.7–11.0)

## 2020-02-06 LAB — COMPREHENSIVE METABOLIC PANEL, NON-FASTING
ALBUMIN: 2 g/dL — ABNORMAL LOW (ref 3.4–4.8)
ALKALINE PHOSPHATASE: 73 U/L (ref 55–145)
ALT (SGPT): 16 U/L (ref 8–22)
ANION GAP: 9 mmol/L (ref 4–13)
AST (SGOT): 16 U/L (ref 8–45)
BILIRUBIN TOTAL: 0.2 mg/dL — ABNORMAL LOW (ref 0.3–1.3)
BUN/CREA RATIO: 27 — ABNORMAL HIGH (ref 6–22)
BUN: 28 mg/dL — ABNORMAL HIGH (ref 8–25)
CALCIUM: 7.6 mg/dL — ABNORMAL LOW (ref 8.8–10.2)
CHLORIDE: 96 mmol/L (ref 96–111)
CO2 TOTAL: 24 mmol/L (ref 23–31)
CREATININE: 1.05 mg/dL (ref 0.60–1.05)
ESTIMATED GFR: 54 mL/min/BSA — ABNORMAL LOW (ref >=60)
GLUCOSE: 174 mg/dL — ABNORMAL HIGH (ref 65–125)
POTASSIUM: 3.3 mmol/L — ABNORMAL LOW (ref 3.5–5.1)
PROTEIN TOTAL: 4.1 g/dL — ABNORMAL LOW (ref 6.0–8.0)
SODIUM: 129 mmol/L — ABNORMAL LOW (ref 136–145)

## 2020-02-06 LAB — POC BLOOD GLUCOSE (RESULTS)
GLUCOSE, POC: 176 mg/dL — ABNORMAL HIGH (ref 60–110)
GLUCOSE, POC: 240 mg/dL — ABNORMAL HIGH (ref 60–110)

## 2020-02-06 LAB — ADULT ROUTINE BLOOD CULTURE, SET OF 2 BOTTLES (BACTERIA AND YEAST)
BLOOD CULTURE, ROUTINE: NO GROWTH
BLOOD CULTURE, ROUTINE: NO GROWTH

## 2020-02-06 MED ORDER — CLONIDINE HCL 0.1 MG TABLET
0.1000 mg | ORAL_TABLET | Freq: Once | ORAL | Status: AC
Start: 2020-02-06 — End: 2020-02-06
  Administered 2020-02-06: 0.1 mg via ORAL
  Filled 2020-02-06: qty 1

## 2020-02-06 MED ORDER — METOPROLOL SUCCINATE ER 50 MG TABLET,EXTENDED RELEASE 24 HR
50.0000 mg | ORAL_TABLET | Freq: Every day | ORAL | 0 refills | Status: AC
Start: 2020-02-07 — End: ?

## 2020-02-06 MED ORDER — METOPROLOL SUCCINATE ER 25 MG TABLET,EXTENDED RELEASE 24 HR
25.0000 mg | ORAL_TABLET | Freq: Once | ORAL | Status: AC
Start: 2020-02-06 — End: 2020-02-06
  Administered 2020-02-06: 25 mg via ORAL
  Filled 2020-02-06: qty 1

## 2020-02-06 MED ORDER — CEFDINIR 300 MG CAPSULE
300.00 mg | ORAL_CAPSULE | Freq: Two times a day (BID) | ORAL | 0 refills | Status: AC
Start: 2020-02-06 — End: 2020-02-09

## 2020-02-06 MED ORDER — HYDRALAZINE 25 MG TABLET
25.00 mg | ORAL_TABLET | Freq: Three times a day (TID) | ORAL | 0 refills | Status: DC
Start: 2020-02-06 — End: 2020-02-11

## 2020-02-06 MED ORDER — LOSARTAN 50 MG TABLET
50.0000 mg | ORAL_TABLET | Freq: Once | ORAL | Status: AC
Start: 2020-02-06 — End: 2020-02-06
  Administered 2020-02-06: 50 mg via ORAL
  Filled 2020-02-06: qty 1

## 2020-02-06 MED ORDER — LOSARTAN 50 MG TABLET
100.0000 mg | ORAL_TABLET | Freq: Every day | ORAL | Status: DC
Start: 2020-02-07 — End: 2020-02-06

## 2020-02-06 MED ORDER — FUROSEMIDE 80 MG TABLET
80.00 mg | ORAL_TABLET | Freq: Two times a day (BID) | ORAL | 0 refills | Status: DC
Start: 2020-02-06 — End: 2020-02-25

## 2020-02-06 MED ORDER — METOPROLOL SUCCINATE ER 50 MG TABLET,EXTENDED RELEASE 24 HR
50.0000 mg | ORAL_TABLET | Freq: Every day | ORAL | Status: DC
Start: 2020-02-07 — End: 2020-02-06

## 2020-02-06 MED ORDER — LOSARTAN 100 MG TABLET
100.0000 mg | ORAL_TABLET | Freq: Every day | ORAL | 0 refills | Status: DC
Start: 2020-02-07 — End: 2020-03-19

## 2020-02-06 MED ORDER — PREDNISONE 10 MG TABLET
ORAL_TABLET | ORAL | 0 refills | Status: DC
Start: 2020-02-06 — End: 2020-02-24

## 2020-02-06 MED ORDER — HYDRALAZINE 25 MG TABLET
25.00 mg | ORAL_TABLET | Freq: Three times a day (TID) | ORAL | Status: DC
Start: 2020-02-06 — End: 2020-02-06
  Administered 2020-02-06: 15:00:00 25 mg via ORAL
  Filled 2020-02-06: qty 1

## 2020-02-06 NOTE — Care Plan (Signed)
Problem: Adult Inpatient Plan of Care  Goal: Plan of Care Review  Outcome: Adequate for Discharge  Goal: Patient-Specific Goal (Individualized)  Outcome: Adequate for Discharge  Goal: Absence of Hospital-Acquired Illness or Injury  Outcome: Adequate for Discharge  Goal: Optimal Comfort and Wellbeing  Outcome: Adequate for Discharge  Goal: Rounds/Family Conference  Outcome: Adequate for Discharge     Problem: Fall Injury Risk  Goal: Absence of Fall and Fall-Related Injury  Outcome: Adequate for Discharge     Problem: Skin Injury Risk Increased  Goal: Skin Health and Integrity  Outcome: Adequate for Discharge     Problem: Cardiac Output Decreased  Goal: Effective Cardiac Output  Outcome: Adequate for Discharge     Problem: Fluid Volume Excess  Goal: Fluid Balance  Outcome: Adequate for Discharge

## 2020-02-06 NOTE — Nurses Notes (Signed)
Patient discharged home with family.  AVS reviewed with patient/care giver.  A written copy of the AVS and discharge instructions was given to the patient/care giver.  Questions sufficiently answered as needed.  Patient/care giver encouraged to follow up with PCP as indicated.  In the event of an emergency, patient/care giver instructed to call 911 or go to the nearest emergency room.     Reviewed discharge instructions, medications, and follow up appointment with patient and sister.   Patient and sister given opportunity to ask questions, verbalized understanding.   Mediport de-accessed without difficulty, no bleeding noted.   Viola CNA, in room to assist patient with personal belongings and getting dressed.   Advised patient to ring light when son arrived to be taken home.   Will continue to monitor. Call light within reach  [facility], RN  02/06/2020, 16:54

## 2020-02-06 NOTE — PT Treatment (Signed)
[facility]  Rehabilitation Services  Physical Therapy Progress Note    Patient Name: [name]  Date of Birth: 1946-07-13  Height:  157.5 cm (5' 2.01")  Weight:  94.3 kg (208 lb)  Room/Bed: 1136/B  Payor: MEDICARE / Plan: MEDICARE PART A AND B / Product Type: Medicare /     Assessment:      Assessed O2 before starting session. Pt voiced that they moved her to RA earlier today, and was 94-95% before starting session. She was able to walk one lap around the unit and stated that, "I wish I could do more but I just can't stand this headache today". O2 remained around 95% while ambulating.  Helped assist pt back into bed, (mainly help with her legs) and propped them up. Call light was left within reach as well as her table.      Plan:   Continue to follow patient according to established plan of care.  The risks/benefits of therapy have been discussed with the patient/caregiver and he/she is in agreement with the established plan of care.     Subjective & Objective:     Pt was supine in bed and voiced that she wasn't feeling as good as she was yesterday, She states that her headache is back and "absolutely killing me, whatever medicine I took for it isn't even touching it." She voiced that she would like to try and walk however         02/06/20 1500   PT Last Visit   PT Received On 02/06/20   Bed Mobility   Did the resident have bed mobility? Yes   Did the resident need help moving in bed? Physical help from one person   Did you supervise the resident moving in bed? I supervised the activity   How much help did the resident need moving in bed? Limited help   Transfer   Did the resident transfer? Yes   Did the resident need help to transfer? Setup help only   Did you supervise the resident transferring? I supervised the activity   How much help did the resident need to transfer? Non-weight-bearing help   Walk in Room   Did the resident walk in the room? Yes   Did the resident need help  walking in the room? Setup help only   Did you supervise the resident walking in the room? I supervised the activity   How much help did the resident need walking in the room? Non-weight-bearing help   Walk in [place]   Did the resident walk in the hall? Yes   Did the resident need help walking in the hall? Physical help from one person   Did you supervise the resident walking in the hall? I supervised the activity   How much help did the resident need walking in the hall? Non-weight-bearing help   Locomotion on Unit   Did the resident locomote on unit? Yes   Did the resident need help locomoting on unit? Physical help from one person   Did you supervise the resident locomoting on unit? I supervised the activity   How much help did the resident need locomoting on unit? Non-weight-bearing help   Bed Mobility   Bed Mobility Yes   Bed Mobility 1   Bed Mobility From 1 Supine   Bed Mobility Type 1 To and from   Bed Mobility to 1 Short sit   Level of Assistance 1 Minimum assistance   Transfers  Transfer Yes   Transfer 1   Transfer From 1 Bed   Transfer Type 1 To and from   Transfer to 1 Stand;Chair with arms   Technique 1 Stand to sit;Sit to stand;Stand pivot   Transfer Device 1   (FWW)   Transfer Level of Assistance 1 Contact guard   Ambulation   Ambulation Yes   Ambulation 1   Surface 1 Level tile   Device 1 Rolling walker   Other Apparatus 1 Other (Comment)  (geri chair follow)   Assistance 1 Contact guard   Quality of Gait 1   (slow paced)   Comments/Distance (ft) 1   (145, 173ft)       Therapist:   Primitivo Gauze

## 2020-02-06 NOTE — Nurses Notes (Signed)
Foley catheter discontinued per physician order.  Balloon deflated and catheter removed intact, with no resistance met.   Patient tolerated well. Advised patient to notify nurse with first urine after removal.   Patient verbalized understanding.   Output amount at discharge:  250 ml clear yellow urine.   Dalia Heading, RN  02/06/2020, 16:30

## 2020-02-06 NOTE — Discharge Summary (Signed)
[facility]  DISCHARGE SUMMARY      PATIENT NAME:  [name], [name]  MRN:  [id number]  DOB:  1946/04/24    INPATIENT ADMISSION DATE: 01/31/2020  DISCHARGE DATE:  02/06/2020    ATTENDING PHYSICIAN: Bethanie Dicker, MD  SERVICE: [facility] HOSPITALIST  PRIMARY CARE PHYSICIAN: Myrene Buddy, MD       DISCHARGE DIAGNOSIS:     Active Hospital Problems    Diagnosis Date Noted   . Acute exacerbation of CHF (congestive heart failure) (CMS HCC) [I50.9] 01/31/2020   . CHF (congestive heart failure) (CMS HCC) [I50.9] 04/13/2019   . COPD (chronic obstructive pulmonary disease) (CMS HCC) [J44.9] 04/13/2019   . Hyperlipidemia [E78.5] 04/13/2019   . Type 2 diabetes mellitus (CMS [place]) [E11.9] 01/24/2017      Resolved Hospital Problems   No resolved problems to display.         REASON FOR HOSPITALIZATION AND HOSPITAL COURSE:    This is a 74 y.o., female admitted for acute exacerbation of CHF.  Patient presented to the emergency room with increasing shortness of breath.  The patient states that she was released from [facility] for urinary tract infection and has been short of breath since that time.  Apparently she called EMS and her O2 sat was noted to be 87% on room air upon arrival.  The patient has had increased swelling of her lower extremities since being released from [name].  Patient denies any altered mental status, vomiting, nausea, or chest pain.  Patient denies any fevers or chills.  Patient denies exposure to COVID-19.  The patient also denies a previous history of congestive heart failure.  Workup in emergency room showed a troponin of 248. ProBNP was elevated at 1420. D-dimer was elevated and a CT angio of the chest showed acute congestive heart failure.    The patient was admitted to the hospital for treatment of her acute exacerbation of CHF.  Patient was started on Lasix 40 mg IV b.i.d..  The patient had excellent diuresis throughout the course of her admission.  The patient did  develop a fever the 1st night of admission and blood cultures were obtained the patient was started on Rocephin and Zithromax as well as Solu-Medrol.  Blood cultures have been negative.  Chest x-ray has remained negative for infiltrate.  Cause of the fever is unclear.  The patient continues to have swelling of her lower extremities however today she is off of O2 and satting 98% on room air.  Physical therapy was consulted and evaluated the patient.  The patient was able to ambulate quite well.  Physical therapy felt that it was safe for the patient to be discharged home.  The patient does have home health.  The patient still requires a considerable amount of diuresis, however she is drastically improved.  Primarily at this point it is lower extremity edema.  The patient also developed some hypertension the while admitted and her home medications were adjusted.  Blood pressure is now well controlled .  At this time I feel the patient can be discharged home to continue diuresis as an outpatient.      DISCHARGE MEDICATIONS:     Current Discharge Medication List      START taking these medications.      Details   cefdinir 300 mg Capsule  Commonly known as: OMNICEF   300 mg, Oral, 2 TIMES DAILY  Qty: 6 Capsule  Refills: 0     predniSONE 10  mg Tablet  Commonly known as: DELTASONE   6tabs x3days, then 4tabs x2days, then 3tabs x2days, then 2tabs x2days, then 1tab x2days, then stop  Qty: 38 Tablet  Refills: 0        CONTINUE these medications which have CHANGED during your visit.      Details   furosemide 80 mg Tablet  Commonly known as: LASIX  What changed:    medication strength   how much to take   when to take this   80 mg, Oral, 2 TIMES DAILY  Qty: 60 Tablet  Refills: 0     hydrALAZINE 25 mg Tablet  Commonly known as: APRESOLINE  What changed:    medication strength   how much to take   25 mg, Oral, EVERY 8 HOURS (SCHEDULED)  Qty: 90 Tablet  Refills: 0     losartan 100 mg Tablet  Commonly known as: COZAAR  Start  taking on: February 07, 2020  What changed:    medication strength   how much to take   how to take this   when to take this   100 mg, Oral, DAILY  Qty: 30 Tablet  Refills: 0     metoprolol succinate 50 mg Tablet Sustained Release 24 hr  Commonly known as: TOPROL-XL  Start taking on: February 07, 2020  What changed:    medication strength   how much to take   50 mg, Oral, DAILY  Qty: 30 Tablet  Refills: 0        CONTINUE these medications - NO CHANGES were made during your visit.      Details   Acetaminophen-Butalbital 50-325 mg Tablet   Oral  Refills: 0     aspirin 81 mg Tablet, Delayed Release (E.C.)  Commonly known as: ECOTRIN   81 mg, Oral, DAILY  Refills: 0     atorvastatin 10 mg Tablet  Commonly known as: LIPITOR   TAKE 1 TABLET BY MOUTH ONCE DAILY  Qty: 90 Tab  Refills: 1     cetirizine 10 mg Tablet  Commonly known as: ZYRTEC   10 mg, Oral, DAILY  Refills: 0     dilTIAZem 30 mg Tablet  Commonly known as: CARDIZEM   30 mg, Oral, 4 TIMES DAILY  Refills: 0     Eliquis 5 mg Tablet  Generic drug: apixaban   TAKE 1/2 TABLET BY MOUTH TWICE DAILY  Qty: 30 Tab  Refills: 3     HYDROcodone-acetaminophen 5-325 mg Tablet  Commonly known as: NORCO   1 Tablet, Oral, EVERY 4 HOURS PRN  Qty: 60 Tab  Refills: 0     insulin aspart U-100 100 unit/mL (3 mL) Insulin Pen  Commonly known as: NovoLOG Flexpen U-100 Insulin   7 Units, Subcutaneous, 2 TIMES DAILY BEFORE MEALS  Qty: 9 Each  Refills: 3     Lantus Solostar U-100 Insulin 100 unit/mL Insulin Pen  Generic drug: insulin glargine   10 Units, Subcutaneous, NIGHTLY  Qty: 3 mL  Refills: 5     magnesium oxide 400 mg Tablet  Commonly known as: MAG-OX   400 mg, Oral, 2 TIMES DAILY  Refills: 0     Nano Pen Needle 32 gauge x 5/32" Needle  Generic drug: insulin pen needles   USE WITH INSULIN FLEXPEN BID AS NEEDED  Refills: 0     omeprazole 40 mg Capsule, Delayed Release(E.C.)  Commonly known as: PRILOSEC   40 mg, Oral, DAILY  Qty: 90 Cap  Refills: 4  ondansetron 4 mg Tablet,  Rapid Dissolve  Commonly known as: ZOFRAN ODT   EVERY 6 HOURS  Refills: 0     pancreatic enzyme replacement 12,000-38,000 -60,000 unit Capsule, Delayed Release(E.C.)  Commonly known as: CREON   Oral, 3 TIMES DAILY WITH MEALS, 2 CAPS WITH MEALS AND 1 CAP WITH EACH SNACK   Refills: 0        STOP taking these medications.    amoxicillin-pot clavulanate 875-125 mg Tablet  Commonly known as: AUGMENTIN     methylPREDNISolone 4 mg Tablet  Commonly known as: MEDROL            Filed Vitals:    02/06/20 1137 02/06/20 1316 02/06/20 1342 02/06/20 1531   BP: (!) 198/62   (!) 133/53   Pulse: 74   71   Resp: 16  16 16    Temp: 36.5 C (97.7 F)   36.5 C (97.7 F)   SpO2:  98%        Physical Exam  HENT:      Head: Normocephalic and atraumatic.   Eyes:      Conjunctiva/sclera: Conjunctivae normal.      Pupils: Pupils are equal, round, and reactive to light.   Cardiovascular:      Rate and Rhythm: Normal rate and regular rhythm.      Heart sounds: Normal heart sounds. No murmur. No friction rub. No gallop.    Pulmonary:      Effort: No respiratory distress.      Breath sounds: Rales present. No wheezing.   Abdominal:      General: Bowel sounds are normal. There is no distension.      Palpations: Abdomen is soft. There is no mass.      Tenderness: There is no abdominal tenderness. There is no guarding or rebound.   Musculoskeletal:         General: No deformity. Normal range of motion.      Cervical back: Normal range of motion and neck supple.      Right lower leg: 2+ Pitting Edema present.      Left lower leg: 2+ Pitting Edema present.   Skin:     General: Skin is warm and dry.      Findings: No erythema or rash.   Neurological:      Mental Status: She is alert and oriented to person, place, and time.      Cranial Nerves: No cranial nerve deficit.   Psychiatric:         Mood and Affect: Affect normal.         Cognition and Memory: Memory normal.         Laboratory Data:     Results for orders placed or performed during the hospital  encounter of 01/31/20 (from the past 24 hour(s))   POC BLOOD GLUCOSE (RESULTS)   Result Value Ref Range    GLUCOSE, POC 274 (H) 60 - 110 mg/dl   POC BLOOD GLUCOSE (RESULTS)   Result Value Ref Range    GLUCOSE, POC 269 (H) 60 - 110 mg/dl   CBC WITH DIFF   Result Value Ref Range    WBC 5.5 3.7 - 11.0 x10^3/uL    RBC 2.80 (L) 3.85 - 5.22 x10^6/uL    HGB 8.9 (L) 11.5 - 16.0 g/dL    HCT 27.5 (L) 34.8 - 46.0 %    MCV 98.2 78.0 - 100.0 fL    MCH 31.8 26.0 - 32.0 pg  MCHC 32.4 31.0 - 35.5 g/dL    RDW-CV 17.6 (H) 11.5 - 15.5 %    PLATELETS 157 150 - 400 x10^3/uL    MPV 11.9 8.7 - 12.5 fL    NEUTROPHIL % 92 %    LYMPHOCYTE % 4 %    MONOCYTE % 3 %    EOSINOPHIL % 0 %    BASOPHIL % 0 %    NEUTROPHIL # 5.01 1.50 - 7.70 x10^3/uL    LYMPHOCYTE # 0.24 (L) 1.00 - 4.80 x10^3/uL    MONOCYTE # 0.14 (L) 0.20 - 1.10 x10^3/uL    EOSINOPHIL # <0.10 <=0.50 x10^3/uL    BASOPHIL # <0.10 <=0.20 x10^3/uL    IMMATURE GRANULOCYTE % 1 0 - 1 %    IMMATURE GRANULOCYTE # <0.10 <0.10 x10^3/uL   COMPREHENSIVE METABOLIC PANEL, NON-FASTING   Result Value Ref Range    SODIUM 129 (L) 136 - 145 mmol/L    POTASSIUM 3.3 (L) 3.5 - 5.1 mmol/L    CHLORIDE 96 96 - 111 mmol/L    CO2 TOTAL 24 23 - 31 mmol/L    ANION GAP 9 4 - 13 mmol/L    BUN 28 (H) 8 - 25 mg/dL    CREATININE 1.05 0.60 - 1.05 mg/dL    BUN/CREA RATIO 27 (H) 6 - 22    ESTIMATED GFR 54 (L) >=60 mL/min/BSA    ALBUMIN 2.0 (L) 3.4 - 4.8 g/dL     CALCIUM 7.6 (L) 8.8 - 10.2 mg/dL    GLUCOSE 174 (H) 65 - 125 mg/dL    ALKALINE PHOSPHATASE 73 55 - 145 U/L    ALT (SGPT) 16 8 - 22 U/L    AST (SGOT)  16 8 - 45 U/L    BILIRUBIN TOTAL 0.2 (L) 0.3 - 1.3 mg/dL    PROTEIN TOTAL 4.1 (L) 6.0 - 8.0 g/dL   POC BLOOD GLUCOSE (RESULTS)   Result Value Ref Range    GLUCOSE, POC 176 (H) 60 - 110 mg/dl   POC BLOOD GLUCOSE (RESULTS)   Result Value Ref Range    GLUCOSE, POC 240 (H) 60 - 110 mg/dl       Imaging Studies:    XR AP MOBILE CHEST   Final Result by Edi, Radresults In (02/24 1130)   Further improvement noted.  Lingering minimal interstitial   edema.      This note was partially created using voice recognition software and is   inherently subject to errors including those of syntax and "sound alike "   substitutions which may escape proof reading.  In such instances, original   meaning may be extrapolated by contextual derivation.         Radiologist location ID: [id number]         XR AP MOBILE CHEST   Final Result by Edi, Radresults In (02/23 1415)   Possibly a slight improvement.      This note was partially created using voice recognition software and is   inherently subject to errors including those of syntax and "sound alike "   substitutions which may escape proof reading.  In such instances, original   meaning may be extrapolated by contextual derivation.         Radiologist location ID: [id number]         XR AP MOBILE CHEST   Final Result by Edi, Radresults In (02/22 1008)   Slight deterioration.      This note was partially created using voice recognition software and  is   inherently subject to errors including those of syntax and "sound alike "   substitutions which may escape proof reading.  In such instances, original   meaning may be extrapolated by contextual derivation.         Radiologist location ID: [id number]         XR AP MOBILE CHEST   Final Result by Edi, Radresults In (02/21 1010)   Similar to prior study      This note was partially created using voice recognition software and is   inherently subject to errors including those of syntax and "sound alike "   substitutions which may escape proof reading.  In such instances, original   meaning may be extrapolated by contextual derivation.         Radiologist location ID: [id number]         XR AP MOBILE CHEST   Final Result by Edi, Radresults In (02/20 1038)   Bilateral effusions with interstitial prominence. Consider   congestive failure. Follow-up suggested      This note was partially created using voice recognition software and is   inherently subject to errors  including those of syntax and "sound alike "   substitutions which may escape proof reading.  In such instances, original   meaning may be extrapolated by contextual derivation.         Radiologist location ID: [id number]         CT ANGIO CHEST W IV CONTRAST   Final Result by Edi, Radresults In (02/19 1556)   Negative for pulmonary embolism. Negative for aortic   dissection.      Acute congestive heart failure. Interstitial edema and bilateral pleural   effusions.      Questionable anasarca. Appear to be some edematous type findings in the   soft tissues surrounding the chest.         The CT exam was performed using one or more the following a dose reduction   techniques: Automated exposure control, adjustment of the mA and/or kV   according to the patient's size, or use of iterative reconstruction   technique.      This note was partially created using voice recognition software and is   inherently subject to errors including those of syntax and "sound alike "   substitutions which may escape proof reading.  In such instances, original   meaning may be extrapolated by contextual derivation.         Radiologist location ID: [id number]         XR AP MOBILE CHEST   Final Result by Edi, Radresults In (02/19 1537)   Mild interstitial edema. Possibly a cardiac etiology, but,   lymphangitic metastatic disease could also look like this. Clinical   correlation is advised.      This note was partially created using voice recognition software and is   inherently subject to errors including those of syntax and "sound alike "   substitutions which may escape proof reading.  In such instances, original   meaning may be extrapolated by contextual derivation.         Radiologist location ID: [id number]             DISCHARGE INSTRUCTIONS:  Follow-up Information     Family Medicine, [facility] .    Specialty: Family Medicine  Contact information:  [address]  [place] SSN-[id number]  [phone]  Refer to [place] - DIABETIC DIET     Diet: DIABETIC DIET      DISCHARGE INSTRUCTION - ACTIVITY     Activity: AS TOLERATED      DISCHARGE INSTRUCTION - MISC    Take medications as directed.  Check doses carefully as I have increased the dose of a number of your medications.  Take antibiotics and prednisone taper as directed.  Follow-up with PCP in 1 week     FOLLOW-UP: [place], [place]     Follow-up in: [place]    Reason for visit: HOSPITAL DISCHARGE           CONDITION ON DISCHARGE: Alert, Oriented and VS Stable    DISCHARGE DISPOSITION:  Home discharge       Bethanie Dicker, MD

## 2020-02-06 NOTE — Care Plan (Signed)
Problem: Adult Inpatient Plan of Care  Goal: Absence of Hospital-Acquired Illness or Injury  Intervention: Identify and Manage Fall Risk  Flowsheets (Taken 02/05/2020 1945)  Safety Promotion/Fall Prevention:   activity supervised   nonskid shoes/slippers when out of bed   safety round/check completed   fall prevention program maintained     Problem: Adult Inpatient Plan of Care  Goal: Absence of Hospital-Acquired Illness or Injury  Intervention: Prevent Infection  Flowsheets (Taken 02/06/2020 0525)  Infection Prevention:   equipment surfaces disinfected   personal protective equipment utilized   promote handwashing   visitors restricted/screened   single patient room provided     Problem: Fluid Volume Excess  Goal: Fluid Balance  Intervention: Monitor and Manage Hypervolemia  Flowsheets (Taken 02/06/2020 0525)  Fluid/Electrolyte Management: oral rehydration therapy initiated

## 2020-02-07 ENCOUNTER — Telehealth (HOSPITAL_COMMUNITY): Payer: Self-pay

## 2020-02-07 NOTE — Nursing Note (Signed)
(  dot)Transition of Care Contact  Discharge Date: 02/06/2020  Transition Facility Type--Hospital (Inpatient or Observation)  [facility]  Interactive Contact(s): Completed or attempted contact indicated by Date/Time  Completed Contact  First Attempt--02/07/2020 11:15 AM  Second Attempt  Third Attempt  Contact Method(s)-- Patient/Caregiver Telephone  Clinical Staff Name/Role who [name], LPN  Transition Note:   02/07/2020 1114--Unable to reach patient for follow up at this time.  No answer.     Further information may be documented in relevant telephone or outreach encounter.

## 2020-02-07 NOTE — Care Management Notes (Signed)
New order received to resume home health services.  Patient established with [facility].  ROC orders sent at this time.

## 2020-02-07 NOTE — Care Management Notes (Signed)
Referral Information  ++++++ Placed Provider #1 ++++++  Case Manager: Carla Johnson  Provider Type: Home Health - Return  Provider Name: [facility] [place]  Address:  [address]  [place], [place] [address]  Contact: Tracy McClure    Phone: [phone] x  Fax:   Fax: [phone]

## 2020-02-10 NOTE — Nursing Note (Signed)
(  dot)Transition of Care Contact  Discharge Date: 02/06/2020  Transition Facility Type--Hospital (Inpatient or Observation)  [facility]  Interactive Contact(s): Completed or attempted contact indicated by Date/Time  Completed Contact  First Attempt--02/07/2020 11:15 AM  Second Attempt--02/10/2020 10:36 AM  Third Attempt  Contact Method(s)-- Patient/Caregiver Telephone  Clinical Staff Name/Role who [name], LPN  Transition Note:   02/07/2020 1114--Unable to reach patient for follow up at this time.  No answer.    02/10/2020 1035--Unable to reach patient for follow up at this time.  Left voicemail to return call with problems or concerns.  Admitted on 01/31/2020 Dx: CHF.  Disch, arged on 02/06/2020.  Has follow up scheduled with Dr Jerilynn Mages. [place] on 02/11/2020 at 1330.     Further information may be documented in relevant telephone or outreach encounter.

## 2020-02-11 ENCOUNTER — Encounter (INDEPENDENT_AMBULATORY_CARE_PROVIDER_SITE_OTHER): Payer: Self-pay | Admitting: FAMILY PRACTICE

## 2020-02-11 ENCOUNTER — Other Ambulatory Visit: Payer: Self-pay

## 2020-02-11 ENCOUNTER — Ambulatory Visit: Payer: Medicare Other | Attending: FAMILY PRACTICE | Admitting: FAMILY PRACTICE

## 2020-02-11 VITALS — BP 160/72 | HR 90 | Temp 98.5°F | Resp 26 | Ht 62.0 in | Wt 195.0 lb

## 2020-02-11 DIAGNOSIS — C7802 Secondary malignant neoplasm of left lung: Secondary | ICD-10-CM | POA: Insufficient documentation

## 2020-02-11 DIAGNOSIS — J449 Chronic obstructive pulmonary disease, unspecified: Secondary | ICD-10-CM | POA: Insufficient documentation

## 2020-02-11 DIAGNOSIS — E876 Hypokalemia: Secondary | ICD-10-CM | POA: Insufficient documentation

## 2020-02-11 DIAGNOSIS — E222 Syndrome of inappropriate secretion of antidiuretic hormone: Secondary | ICD-10-CM | POA: Insufficient documentation

## 2020-02-11 DIAGNOSIS — Z794 Long term (current) use of insulin: Secondary | ICD-10-CM | POA: Insufficient documentation

## 2020-02-11 DIAGNOSIS — E1142 Type 2 diabetes mellitus with diabetic polyneuropathy: Secondary | ICD-10-CM | POA: Insufficient documentation

## 2020-02-11 DIAGNOSIS — C250 Malignant neoplasm of head of pancreas: Secondary | ICD-10-CM

## 2020-02-11 DIAGNOSIS — I10 Essential (primary) hypertension: Secondary | ICD-10-CM

## 2020-02-11 DIAGNOSIS — E119 Type 2 diabetes mellitus without complications: Secondary | ICD-10-CM

## 2020-02-11 DIAGNOSIS — I5033 Acute on chronic diastolic (congestive) heart failure: Secondary | ICD-10-CM

## 2020-02-11 DIAGNOSIS — E440 Moderate protein-calorie malnutrition: Secondary | ICD-10-CM | POA: Insufficient documentation

## 2020-02-11 DIAGNOSIS — I110 Hypertensive heart disease with heart failure: Secondary | ICD-10-CM | POA: Insufficient documentation

## 2020-02-11 DIAGNOSIS — R188 Other ascites: Secondary | ICD-10-CM | POA: Insufficient documentation

## 2020-02-11 DIAGNOSIS — F325 Major depressive disorder, single episode, in full remission: Secondary | ICD-10-CM | POA: Insufficient documentation

## 2020-02-11 DIAGNOSIS — R519 Headache, unspecified: Secondary | ICD-10-CM

## 2020-02-11 DIAGNOSIS — I509 Heart failure, unspecified: Secondary | ICD-10-CM | POA: Insufficient documentation

## 2020-02-11 DIAGNOSIS — E871 Hypo-osmolality and hyponatremia: Secondary | ICD-10-CM

## 2020-02-11 DIAGNOSIS — K7460 Unspecified cirrhosis of liver: Secondary | ICD-10-CM | POA: Insufficient documentation

## 2020-02-11 DIAGNOSIS — I4811 Longstanding persistent atrial fibrillation: Secondary | ICD-10-CM | POA: Insufficient documentation

## 2020-02-11 DIAGNOSIS — C259 Malignant neoplasm of pancreas, unspecified: Secondary | ICD-10-CM | POA: Insufficient documentation

## 2020-02-11 DIAGNOSIS — G6289 Other specified polyneuropathies: Secondary | ICD-10-CM

## 2020-02-11 LAB — CBC WITH DIFF
BASOPHIL #: <0.1 10*3/uL (ref <=0.20)
BASOPHIL %: 0 %
EOSINOPHIL #: 0.1 10*3/uL (ref <=0.50)
EOSINOPHIL %: 1 %
HCT: 32.4 % — ABNORMAL LOW (ref 34.8–46.0)
HGB: 10.1 g/dL — ABNORMAL LOW (ref 11.5–16.0)
IMMATURE GRANULOCYTE #: 0.11 10*3/uL — ABNORMAL HIGH (ref <0.10)
IMMATURE GRANULOCYTE %: 1 % (ref 0–1)
LYMPHOCYTE #: 0.47 10*3/uL — ABNORMAL LOW (ref 1.00–4.80)
LYMPHOCYTE %: 5 %
MCH: 30.8 pg (ref 26.0–32.0)
MCHC: 31.2 g/dL (ref 31.0–35.5)
MCV: 98.8 fL (ref 78.0–100.0)
MONOCYTE #: 0.51 10*3/uL (ref 0.20–1.10)
MONOCYTE %: 6 %
MPV: 12.5 fL (ref 8.7–12.5)
NEUTROPHIL #: 8.12 10*3/uL — ABNORMAL HIGH (ref 1.50–7.70)
NEUTROPHIL %: 87 %
PLATELETS: 109 10*3/uL — ABNORMAL LOW (ref 150–400)
RBC: 3.28 10*6/uL — ABNORMAL LOW (ref 3.85–5.22)
RDW-CV: 18 % — ABNORMAL HIGH (ref 11.5–15.5)
WBC: 9.3 10*3/uL (ref 3.7–11.0)

## 2020-02-11 LAB — COMPREHENSIVE METABOLIC PANEL, NON-FASTING
ALBUMIN: 2.4 g/dL — ABNORMAL LOW (ref 3.4–4.8)
ALKALINE PHOSPHATASE: 81 U/L (ref 55–145)
ALT (SGPT): 24 U/L — ABNORMAL HIGH (ref 8–22)
ANION GAP: 12 mmol/L (ref 4–13)
AST (SGOT): 18 U/L (ref 8–45)
BILIRUBIN TOTAL: 0.4 mg/dL (ref 0.3–1.3)
BUN/CREA RATIO: 22 (ref 6–22)
BUN: 25 mg/dL (ref 8–25)
CALCIUM: 8 mg/dL — ABNORMAL LOW (ref 8.8–10.2)
CHLORIDE: 97 mmol/L (ref 96–111)
CO2 TOTAL: 29 mmol/L (ref 23–31)
CREATININE: 1.13 mg/dL — ABNORMAL HIGH (ref 0.60–1.05)
ESTIMATED GFR: 49 mL/min/BSA — ABNORMAL LOW (ref >=60)
GLUCOSE: 132 mg/dL — ABNORMAL HIGH (ref 65–125)
POTASSIUM: 3 mmol/L — ABNORMAL LOW (ref 3.5–5.1)
PROTEIN TOTAL: 4.4 g/dL — ABNORMAL LOW (ref 6.0–8.0)
SODIUM: 138 mmol/L (ref 136–145)

## 2020-02-11 LAB — THYROID STIMULATING HORMONE (SENSITIVE TSH): TSH: 2.144 u[IU]/mL (ref 0.430–3.550)

## 2020-02-11 MED ORDER — SPIRONOLACTONE 25 MG TABLET
25.0000 mg | ORAL_TABLET | Freq: Every day | ORAL | 5 refills | Status: DC
Start: 2020-02-11 — End: 2020-02-27

## 2020-02-11 MED ORDER — POTASSIUM CHLORIDE ER 20 MEQ TABLET,EXTENDED RELEASE(PART/CRYST)
20.0000 meq | ORAL_TABLET | Freq: Every day | ORAL | 5 refills | Status: DC
Start: 2020-02-11 — End: 2020-02-25

## 2020-02-11 MED ORDER — HYDRALAZINE 50 MG TABLET
25.00 mg | ORAL_TABLET | Freq: Two times a day (BID) | ORAL | 5 refills | Status: DC
Start: 2020-02-11 — End: 2020-05-15

## 2020-02-11 NOTE — Assessment & Plan Note (Signed)
Will check potassium today and start on potassium supplement.  Also add spironolactone and monitor her electrolytes

## 2020-02-11 NOTE — Assessment & Plan Note (Signed)
Has appointment with oncologist tomorrow

## 2020-02-11 NOTE — Assessment & Plan Note (Signed)
Sodium was 129 with checking a BMP today.

## 2020-02-11 NOTE — Assessment & Plan Note (Signed)
Congestive heart failure has improved since her hospitalization she [age]

## 2020-02-11 NOTE — Nursing Note (Signed)
Pt here for hospital follow up. Pt reports she has been admitted to [facility] as well as [facility]; pt unsure of diagnosis. Pt reports that she has had significant swelling to lower extremities since discharge. Pt also reports sob and headaches. Pt also c/o elevated blood sugars and is requesting a sliding scale regime. Pt unsure of her medications as she did not bring a list or her medications. Pt has not had flu or covid vaccines this season. [facility]

## 2020-02-11 NOTE — Assessment & Plan Note (Signed)
Blood pressure is okay today

## 2020-02-11 NOTE — Progress Notes (Signed)
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  She has been continuing to get chemotherapy up until recently when she was at [facility] and then readmitted here to [facility].  She was admitted to [facility] with anasarca and congestive heart failure with hypoxia.  She was admitted and had diuresis.  Echocardiogram done during her hospitalization showed that her ejection fraction was 70% with only some mild diastolic dysfunction.  She had marked anasarca.  Her sodium was 129. The potassium was 3.3 prior to discharge.  She was sen home on Lasix 80 mg p.o. b.i.d..  She was continued on her Norvasc.  She was not placed on any potassium and spironolactone was not added.    The patient has a history of cirrhosis in addition to her pancreatic cancer.  The combination of both I think is what causing her marked fluid retention.  Her granddaughter and grandson with her states that she has lost 17 lb since she has come home.  She is supposed to see the chemotherapy doctor tomorrow.  She is not sure whether she wants to continue chemotherapy.  The fact that she has metastatic disease to lung she is making her prognosis even more support.  I did advise her to have some pointed questions of her doctor to see whether she felt that continuing chemotherapy would be in her best interest.  I did offer palliative care therapy referral and/or hospice referral if she wanted.  She is a DNR.  I did reconfirm that today.      Transitional Care services provided:  Discharge documentation was reviewed, Discharge documentation used to reconcile outpatient medication list, Education about medication(s) provided to patient/ family/ caregiver   and I discussed possible referral to palliative care or hospice if she would want to do that.  She is to see her oncologist tomorrow and make a decision as to whether she  should continue chemotherapy.     Transition of Care Contact  Discharge Date: 02/06/2020  Transition Facility Type--Hospital (Inpatient or Observation)  [facility]  Interactive Contact(s): Completed or attempted contact indicated by Date/Time  Completed Contact  First Attempt--02/07/2020 11:15 AM  Second Attempt--02/10/2020 10:36 AM  Third Attempt  Contact Method(s)-- Patient/Caregiver Telephone  Clinical Staff Name/Role who [name], LPN  Transition Note:   02/07/2020 1114--Unable to reach patient for follow up at this time.  No answer.    02/10/2020 1035--Unable to reach patient for follow up at this time.  Left voicemail to return call with problems or concerns.  Admitted on 01/31/2020 Dx: CHF.  Disch, arged on 02/06/2020.  Has follow up scheduled with Dr Jerilynn Mages. Shenna Brissette on 02/11/2020 at 1330.  Review of Systems   Constitutional: Positive for malaise/fatigue and weight loss. Negative for chills and fever.   Eyes: Negative for blurred vision and double vision.   Respiratory: Negative for cough, shortness of breath and wheezing.    Cardiovascular: Positive for leg swelling. Negative for chest pain, palpitations and orthopnea.   Gastrointestinal: Negative for abdominal pain, blood in stool, constipation, diarrhea, melena, nausea and vomiting.   Genitourinary: Negative for dysuria.   Musculoskeletal: Negative for joint pain.   Skin: Positive for itching.        Lesion on right toe see picture   Neurological: Negative for headaches.   Psychiatric/Behavioral: Negative for depression.        Physical Exam  Vitals and nursing note reviewed.   Constitutional:       Appearance: She is obese. She is ill-appearing. She is not diaphoretic.      Comments: Patient appears markedly tired fatigued and discouraged   HENT:      Head: Normocephalic and atraumatic.      Right Ear: Tympanic membrane, ear canal and external ear normal. There is no impacted cerumen.      Left Ear: Tympanic  membrane, ear canal and external ear normal. There is no impacted cerumen.      Nose: Nose normal. No congestion or rhinorrhea.      Mouth/Throat:      Mouth: Mucous membranes are moist.      Pharynx: Oropharynx is clear. No oropharyngeal exudate or posterior oropharyngeal erythema.   Eyes:      General: No scleral icterus.        Right eye: No discharge.         Left eye: No discharge.      Extraocular Movements: Extraocular movements intact.      Conjunctiva/sclera: Conjunctivae normal.      Pupils: Pupils are equal, round, and reactive to light.   Neck:      Thyroid: No thyromegaly.      Vascular: No JVD.      Trachea: No tracheal deviation.   Cardiovascular:      Rate and Rhythm: Normal rate. Rhythm irregular.      Heart sounds: Normal heart sounds. No murmur. No friction rub. No gallop.    Pulmonary:      Effort: Pulmonary effort is normal. No respiratory distress.      Breath sounds: Normal breath sounds. No stridor. No wheezing, rhonchi or rales.   Chest:      Chest wall: No tenderness.   Abdominal:      General: Bowel sounds are normal. There is distension.      Palpations: Abdomen is soft. There is no mass.      Tenderness: There is no abdominal tenderness. There is no guarding or rebound.   Musculoskeletal:         General: Swelling present. No tenderness or deformity.      Cervical back: Normal range of motion and neck supple.      Right lower leg: Edema present.      Left lower leg: Edema present.      Comments: Patient with marked pitting edema and/or extremities is all the way up into her thighs.  No weeping of the skin but he is very tense and tight   Lymphadenopathy:      Cervical: No cervical adenopathy.   Skin:     General: Skin is warm and dry.      Coloration: Skin is not jaundiced.      Findings: Lesion present. No bruising, erythema or rash.      Comments: Lesion on right toe see picture   Neurological:      General: No focal deficit present.      Mental Status: She is alert and oriented to  person, place, and time.      Cranial Nerves: No cranial nerve deficit.      Motor: Weakness present.      Gait: Gait abnormal.      Comments: Patient walking with walker.   Psychiatric:         Mood and Affect: Mood and affect normal.         Thought Content: Thought content normal.  Judgment: Judgment normal.               Problem List Items Addressed This Visit        Unprioritized    COPD (chronic obstructive pulmonary disease) (CMS HCC) (Chronic)     Oxygen saturation today is 96% on room air         Relevant Orders    COMPREHENSIVE METABOLIC PANEL, NON-FASTING    Essential hypertension (Chronic)     Blood pressure is okay today         Relevant Medications    hydrALAZINE (APRESOLINE) 50 mg Oral Tablet    spironolactone (ALDACTONE) 25 mg Oral Tablet    potassium chloride (K-DUR) 20 mEq Oral Tab Sust.Rel. Particle/Crystal    Other Relevant Orders    CBC/DIFF    THYROID STIMULATING HORMONE (SENSITIVE TSH)    COMPREHENSIVE METABOLIC PANEL, NON-FASTING    COMPREHENSIVE METABOLIC PANEL, NON-FASTING    CHF (congestive heart failure) (CMS HCC) - Primary (Chronic)    Relevant Medications    hydrALAZINE (APRESOLINE) 50 mg Oral Tablet    spironolactone (ALDACTONE) 25 mg Oral Tablet    potassium chloride (K-DUR) 20 mEq Oral Tab Sust.Rel. 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 Particle/Crystal    Other Relevant Orders    COMPREHENSIVE METABOLIC PANEL, NON-FASTING    Hypokalemia     Will check potassium today and start on potassium supplement.  Also add spironolactone and monitor her electrolytes         Relevant Orders    COMPREHENSIVE METABOLIC PANEL, NON-FASTING    Headache     Persistent headache         Relevant Orders    COMPREHENSIVE METABOLIC PANEL, NON-FASTING    Hyponatremia     Sodium was 129 with checking a BMP today.         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)    Relevant Orders    COMPREHENSIVE METABOLIC PANEL, NON-FASTING    Moderate protein-calorie malnutrition (CMS HCC)    Relevant Orders    COMPREHENSIVE METABOLIC PANEL, NON-FASTING    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)    Relevant Orders    COMPREHENSIVE METABOLIC PANEL, NON-FASTING    Major  depressive disorder, single episode, in full remission (CMS HCC)    Relevant Orders    COMPREHENSIVE METABOLIC PANEL, NON-FASTING    Longstanding persistent atrial fibrillation (CMS HCC)     Rate is controlled         Relevant Orders    COMPREHENSIVE METABOLIC PANEL, NON-FASTING      Other Visit Diagnoses     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

## 2020-02-11 NOTE — Assessment & Plan Note (Signed)
Lab sodium 129

## 2020-02-11 NOTE — Patient Instructions (Addendum)
RTC in 1 week  Stop Norvasc  Increase the hydralazine to 50mg  PO BID Rx sent  Start Potassium 20MEQ 1 tab per day  Start spironolactone  Let me know if she wants to do palliative care or hospice  Sliding scale insulin:  For BS 150-200 no coverage other than her regular insuslin  For BS 201-250 give 3 units  For BS 251-300 give 6 units  For BS 301-350 give 9 units  For BS 350-400 give 12 units  For BS greater than 401 give 15 units

## 2020-02-11 NOTE — Assessment & Plan Note (Addendum)
Rate is controlled 

## 2020-02-11 NOTE — Assessment & Plan Note (Signed)
Oxygen saturation today is 96% on room air

## 2020-02-11 NOTE — Assessment & Plan Note (Signed)
Persistent headache

## 2020-02-13 ENCOUNTER — Encounter (INDEPENDENT_AMBULATORY_CARE_PROVIDER_SITE_OTHER): Payer: Self-pay | Admitting: FAMILY PRACTICE

## 2020-02-13 DIAGNOSIS — Z515 Encounter for palliative care: Secondary | ICD-10-CM | POA: Insufficient documentation

## 2020-02-17 ENCOUNTER — Other Ambulatory Visit (INDEPENDENT_AMBULATORY_CARE_PROVIDER_SITE_OTHER): Payer: Self-pay | Admitting: FAMILY PRACTICE

## 2020-02-17 MED ORDER — LIPASE-PROTEASE-AMYLASE 12,000-38,000-60,000 UNIT CAPSULE,DELAYED REL
2.0000 | DELAYED_RELEASE_CAPSULE | Freq: Three times a day (TID) | ORAL | 1 refills | Status: DC
Start: 2020-02-17 — End: 2020-08-05

## 2020-02-18 ENCOUNTER — Other Ambulatory Visit: Payer: Self-pay

## 2020-02-18 ENCOUNTER — Ambulatory Visit: Payer: Medicare Other | Attending: FAMILY PRACTICE

## 2020-02-18 ENCOUNTER — Encounter (INDEPENDENT_AMBULATORY_CARE_PROVIDER_SITE_OTHER): Payer: Self-pay | Admitting: FAMILY PRACTICE

## 2020-02-18 ENCOUNTER — Telehealth (INDEPENDENT_AMBULATORY_CARE_PROVIDER_SITE_OTHER): Payer: Self-pay | Admitting: FAMILY PRACTICE

## 2020-02-18 ENCOUNTER — Other Ambulatory Visit: Attending: FAMILY PRACTICE | Admitting: FAMILY PRACTICE

## 2020-02-18 DIAGNOSIS — N390 Urinary tract infection, site not specified: Secondary | ICD-10-CM

## 2020-02-18 LAB — URINALYSIS, MACROSCOPIC
BILIRUBIN: NEGATIVE mg/dL
BLOOD: NEGATIVE mg/dL
GLUCOSE: 100 mg/dL — AB
KETONES: NEGATIVE mg/dL
LEUKOCYTES: NEGATIVE WBCs/uL
NITRITE: NEGATIVE
PH: 7 (ref 5.0–8.5)
SPECIFIC GRAVITY: 1.01 (ref 1.005–1.030)
UROBILINOGEN: 0.2 mg/dL

## 2020-02-18 LAB — URINALYSIS, MICROSCOPIC

## 2020-02-18 NOTE — Telephone Encounter (Signed)
[facility] nurse called  She stated the patient is having some frequency and vaginal irritation, and when she tried to urinate - nothing comes out    [place] said that she has taken a UA up to [facility] and she will put the order in    Lucretia Field, RN

## 2020-02-18 NOTE — Telephone Encounter (Signed)
Okay to get UA and this person

## 2020-02-19 ENCOUNTER — Telehealth (INDEPENDENT_AMBULATORY_CARE_PROVIDER_SITE_OTHER): Payer: Self-pay | Admitting: FAMILY PRACTICE

## 2020-02-19 MED ORDER — CEPHALEXIN 500 MG CAPSULE
500.00 mg | ORAL_CAPSULE | Freq: Two times a day (BID) | ORAL | 0 refills | Status: DC
Start: 2020-02-19 — End: 2020-02-24

## 2020-02-19 NOTE — Telephone Encounter (Signed)
Faxed to [facility],    Lucretia Field, RN

## 2020-02-19 NOTE — Telephone Encounter (Signed)
Left message on voice mail regarding patient does have a UTI and Dr.Wantz sent in rx for Keflex 500mg  PO Bid x 10 days to [facility]. Marcie Bal, LPN

## 2020-02-19 NOTE — Telephone Encounter (Signed)
Call and tell patient that that she does have a UTI.  I will send in rx for Keflex 500mg  PO Bid x 10 days.  Sent to [facility].

## 2020-02-19 NOTE — Nursing Note (Signed)
(  dot)Transition of Care Contact  Discharge Date: 02/06/2020  Transition Facility Type--Hospital (Inpatient or Observation)  [facility]  Interactive Contact(s): Completed or attempted contact indicated by Date/Time  Completed Contact  First Attempt--02/07/2020 11:15 AM  Second Attempt--02/10/2020 10:36 AM  Third Attempt--02/19/2020  1:28 PM  Contact Method(s)-- Patient/Caregiver Telephone  Clinical Staff Name/Role who [name], LPN  Transition Note:   02/07/2020 1114--Unable to reach patient for follow up at this time.  No answer.    02/10/2020 1035--Unable to reach patient for follow up at this time.  Left voicemail to return call with problems or concerns.  Admitted on 01/31/2020 Dx: CHF.  Discharged on 02/06/2020.  Has follow up scheduled with Dr Jerilynn Mages. Wantz on 02/11/2020 at 1330.     Further information may be documented in relevant telephone or outreach encounter.

## 2020-02-21 ENCOUNTER — Other Ambulatory Visit: Payer: Medicare Other | Attending: FAMILY PRACTICE | Admitting: FAMILY PRACTICE

## 2020-02-21 NOTE — Addendum Note (Signed)
Addended by: Fermin Schwab on: 02/21/2020 08:47 AM     Modules accepted: Orders

## 2020-02-22 ENCOUNTER — Encounter (INDEPENDENT_AMBULATORY_CARE_PROVIDER_SITE_OTHER): Payer: Self-pay | Admitting: FAMILY PRACTICE

## 2020-02-22 LAB — URINE CULTURE: URINE CULTURE: >100000

## 2020-02-24 ENCOUNTER — Encounter (INDEPENDENT_AMBULATORY_CARE_PROVIDER_SITE_OTHER): Payer: Self-pay | Admitting: FAMILY PRACTICE

## 2020-02-24 ENCOUNTER — Ambulatory Visit: Payer: Medicare Other | Attending: FAMILY PRACTICE | Admitting: FAMILY PRACTICE

## 2020-02-24 ENCOUNTER — Other Ambulatory Visit: Payer: Self-pay

## 2020-02-24 DIAGNOSIS — I509 Heart failure, unspecified: Secondary | ICD-10-CM | POA: Insufficient documentation

## 2020-02-24 DIAGNOSIS — I110 Hypertensive heart disease with heart failure: Secondary | ICD-10-CM | POA: Insufficient documentation

## 2020-02-24 DIAGNOSIS — C259 Malignant neoplasm of pancreas, unspecified: Secondary | ICD-10-CM | POA: Insufficient documentation

## 2020-02-24 DIAGNOSIS — I10 Essential (primary) hypertension: Secondary | ICD-10-CM

## 2020-02-24 DIAGNOSIS — J449 Chronic obstructive pulmonary disease, unspecified: Secondary | ICD-10-CM | POA: Insufficient documentation

## 2020-02-24 DIAGNOSIS — C7802 Secondary malignant neoplasm of left lung: Secondary | ICD-10-CM | POA: Insufficient documentation

## 2020-02-24 MED ORDER — TIOTROPIUM BROMIDE 18 MCG CAPSULE WITH INHALATION DEVICE
18.0000 ug | ORAL_CAPSULE | Freq: Every day | RESPIRATORY_TRACT | 5 refills | Status: DC
Start: 2020-02-24 — End: 2020-09-24

## 2020-02-24 NOTE — Assessment & Plan Note (Signed)
No further oncological intervention at this time

## 2020-02-24 NOTE — Assessment & Plan Note (Signed)
No further oncological intervention at this time.

## 2020-02-24 NOTE — Nursing Note (Signed)
Pt phone triage complete. Started at 40 and ended 38. Pt c/o diarrhea x 4 days along with intermittent abdominal cramping. Pt denies vomiting or fever or any other c/o. Pt has not had covid vaccine. Pt does not need refills at this time. BE

## 2020-02-24 NOTE — Progress Notes (Signed)
Family Medicine  Progress Note    Date of Service:  02/24/2020  [name], [name], 74 y.o. female  Date of Admission:  (Not on file)  Date of Birth:  [birth year]  PCP: Myrene Buddy, MD    Patient's location: [place] [address]   Patient/family aware of provider location: Yes  Patient/family consent for video visit: Yes  Interview and observation performed by: Myrene Buddy, MD    HPI:  74 year old Caucasian female regular patient of mine who has end-stage pancreatic cancer.  He was recently at her oncologist and they stated they had nothing new to suggest for her.  She was seen in the office on the 2nd of March and was noted to have marked edema and anasarca type picture.  She is on spironolactone as well as Lasix 80 mg twice a day.  She is now staying at her granddaughter's who is a LPN.  She states that she has lost significant weight about approximately 40 lb.  Home health nursing is coming today were to have them draw a basic metabolic panel on her to see how her kidney function is doing.  We cut her Lasix back to 80 mg once a day.  She has had some diarrhea.  I suggested she use some Imodium for her diarrhea.    The patient is a do not resuscitate.  He did request day of Spiriva inhaler for her to see with would help with her shortness of breath.  We did send that in today.  She does not need refills on any of her other medications.    The patient did not appear to be in any acute distress.  She was reclining.  Her edema in her ankles is much less than what it had been.  But there was still some present.    Her saturation at home has been 99% her blood pressure is 155/72.    No current facility-administered medications for this visit.       Allergies   Allergen Reactions   . Sulfa (Sulfonamides) Itching             VITALS:  TEMP:  HR:80  RR: 16  BP:155/72  HT:  WT: 167lbs.  Sat 99% on RA    Review of Systems   Constitutional: Positive for malaise/fatigue and weight loss. Negative for chills and fever.      Respiratory: Positive for shortness of breath. Negative for wheezing.    Cardiovascular: Negative for chest pain.   Gastrointestinal: Positive for diarrhea. Negative for abdominal pain, blood in stool, melena, nausea and vomiting.   Neurological: Positive for weakness.       Observational exam  NO tachypnea, alert, NAD,  Appears pale in color  No abdominal pain or tenderness  Edema in ankles still present but less compared to last visit.  Laboratory Data:     BMP pending today    Imaging Studies:    No orders to display       Assessment/Plan:  Problem List Items Addressed This Visit        Unprioritized    Pancreatic cancer (CMS [place]) (Chronic)     No further oncological intervention at this time.         COPD (chronic obstructive pulmonary disease) (CMS HCC) (Chronic)     Will send in a prescription for Spiriva         Essential hypertension (Chronic)     Blood pressure elevated today at home  CHF (congestive heart failure) (CMS HCC) (Chronic)     Patient has diuresed 40 lb over the last 2 weeks.  She still has edema.  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      Going to decrease her Lasix to 80 mg per day rather than b.i.d..  Do a basic metabolic panel today when nursing sees her.  Hold her Lasix for tomorrow because of her diarrhea.  Use Imodium as necessary for diarrhea    This note was partially created using voice recognition software and is inherently subject to errors including those of syntax and "sound alike " substitutions which may escape proof reading.  In such instances, original meaning may be extrapolated by contextual derivation.    Myrene Buddy, MD

## 2020-02-24 NOTE — Assessment & Plan Note (Signed)
Will send in a prescription for Spiriva

## 2020-02-24 NOTE — Assessment & Plan Note (Signed)
Blood pressure elevated today at home

## 2020-02-24 NOTE — Patient Instructions (Signed)
RTC in 2 weeks

## 2020-02-24 NOTE — Assessment & Plan Note (Signed)
Patient has diuresed 40 lb over the last 2 weeks.  She still has edema.  Oxygen saturation 99%

## 2020-02-25 ENCOUNTER — Other Ambulatory Visit: Payer: Self-pay

## 2020-02-25 ENCOUNTER — Ambulatory Visit: Payer: Medicare Other | Attending: FAMILY PRACTICE

## 2020-02-25 ENCOUNTER — Telehealth (INDEPENDENT_AMBULATORY_CARE_PROVIDER_SITE_OTHER): Payer: Self-pay | Admitting: FAMILY PRACTICE

## 2020-02-25 DIAGNOSIS — I10 Essential (primary) hypertension: Secondary | ICD-10-CM

## 2020-02-25 LAB — BASIC METABOLIC PANEL, FASTING
ANION GAP: 5 mmol/L (ref 4–13)
BUN/CREA RATIO: 26 — ABNORMAL HIGH (ref 6–22)
BUN: 33 mg/dL — ABNORMAL HIGH (ref 8–25)
CALCIUM: 7.7 mg/dL — ABNORMAL LOW (ref 8.8–10.2)
CHLORIDE: 101 mmol/L (ref 96–111)
CO2 TOTAL: 22 mmol/L — ABNORMAL LOW (ref 23–31)
CREATININE: 1.27 mg/dL — ABNORMAL HIGH (ref 0.60–1.05)
ESTIMATED GFR: 43 mL/min/BSA — ABNORMAL LOW (ref >=60)
GLUCOSE: 248 mg/dL — ABNORMAL HIGH (ref 65–125)
POTASSIUM: 5.5 mmol/L — ABNORMAL HIGH (ref 3.5–5.1)
SODIUM: 128 mmol/L — ABNORMAL LOW (ref 136–145)

## 2020-02-25 MED ORDER — FUROSEMIDE 80 MG TABLET
80.0000 mg | ORAL_TABLET | Freq: Every day | ORAL | Status: DC
Start: 2020-02-25 — End: 2020-03-19

## 2020-02-25 NOTE — Telephone Encounter (Signed)
Please call this patient and/or her caretaker who I believe is her granddaughter.  Tell her that this is probably his potassium is high and she needs that.  Her potassium for now.  She is also a little bit on the dry side and we have decreased her Lasix.  I would like to have her let me know on Wednesday when her weight is doing we may need to decrease her Lasix further.  She needs to have a repeat check of her basic metabolic panel on Thursday or Friday of this week

## 2020-02-26 NOTE — Telephone Encounter (Signed)
Spoke with family regarding need for repeat bmp on Thursday or Friday. Grandaughter reports that her weight this am was 170.6lb and yesterday it was 168.6lb. She did not give her lasix to her yesterday but diarrhea has resolved and she did have her normal dose of 80mg  po this am. BE

## 2020-02-27 ENCOUNTER — Ambulatory Visit (HOSPITAL_COMMUNITY): Payer: Medicare Other

## 2020-02-27 ENCOUNTER — Telehealth (INDEPENDENT_AMBULATORY_CARE_PROVIDER_SITE_OTHER): Payer: Self-pay | Admitting: FAMILY PRACTICE

## 2020-02-27 ENCOUNTER — Other Ambulatory Visit: Payer: Self-pay

## 2020-02-27 ENCOUNTER — Emergency Department (HOSPITAL_COMMUNITY): Payer: Medicare Other

## 2020-02-27 ENCOUNTER — Encounter (HOSPITAL_COMMUNITY): Payer: Self-pay

## 2020-02-27 ENCOUNTER — Emergency Department
Admission: EM | Admit: 2020-02-27 | Discharge: 2020-02-27 | Disposition: A | Discharge status: Home / Self Care | Payer: Medicare Other | Attending: PHYSICIAN ASSISTANT | Admitting: PHYSICIAN ASSISTANT

## 2020-02-27 DIAGNOSIS — I509 Heart failure, unspecified: Secondary | ICD-10-CM | POA: Insufficient documentation

## 2020-02-27 DIAGNOSIS — C259 Malignant neoplasm of pancreas, unspecified: Secondary | ICD-10-CM | POA: Insufficient documentation

## 2020-02-27 DIAGNOSIS — E119 Type 2 diabetes mellitus without complications: Secondary | ICD-10-CM

## 2020-02-27 DIAGNOSIS — D649 Anemia, unspecified: Secondary | ICD-10-CM | POA: Insufficient documentation

## 2020-02-27 DIAGNOSIS — I10 Essential (primary) hypertension: Secondary | ICD-10-CM

## 2020-02-27 DIAGNOSIS — Z87891 Personal history of nicotine dependence: Secondary | ICD-10-CM | POA: Insufficient documentation

## 2020-02-27 DIAGNOSIS — R6883 Chills (without fever): Secondary | ICD-10-CM | POA: Insufficient documentation

## 2020-02-27 DIAGNOSIS — Z20822 Contact with and (suspected) exposure to covid-19: Secondary | ICD-10-CM | POA: Insufficient documentation

## 2020-02-27 DIAGNOSIS — N183 Chronic kidney disease, stage 3 unspecified (CMS HCC): Secondary | ICD-10-CM | POA: Insufficient documentation

## 2020-02-27 DIAGNOSIS — C7800 Secondary malignant neoplasm of unspecified lung: Secondary | ICD-10-CM | POA: Insufficient documentation

## 2020-02-27 DIAGNOSIS — E875 Hyperkalemia: Secondary | ICD-10-CM | POA: Insufficient documentation

## 2020-02-27 LAB — COMPREHENSIVE METABOLIC PANEL, NON-FASTING
ALBUMIN: 2.3 g/dL — ABNORMAL LOW (ref 3.4–4.8)
ALKALINE PHOSPHATASE: 148 U/L — ABNORMAL HIGH (ref 55–145)
ALT (SGPT): 26 U/L — ABNORMAL HIGH (ref 8–22)
ANION GAP: 7 mmol/L (ref 4–13)
AST (SGOT): 14 U/L (ref 8–45)
BILIRUBIN TOTAL: 0.3 mg/dL (ref 0.3–1.3)
BUN/CREA RATIO: 24 — ABNORMAL HIGH (ref 6–22)
BUN: 33 mg/dL — ABNORMAL HIGH (ref 8–25)
CALCIUM: 7.6 mg/dL — ABNORMAL LOW (ref 8.8–10.2)
CHLORIDE: 98 mmol/L (ref 96–111)
CO2 TOTAL: 20 mmol/L — ABNORMAL LOW (ref 23–31)
CREATININE: 1.4 mg/dL — ABNORMAL HIGH (ref 0.60–1.05)
ESTIMATED GFR: 38 mL/min/BSA — ABNORMAL LOW (ref >=60)
GLUCOSE: 316 mg/dL — ABNORMAL HIGH (ref 65–125)
POTASSIUM: 5.5 mmol/L — ABNORMAL HIGH (ref 3.5–5.1)
PROTEIN TOTAL: 4.5 g/dL — ABNORMAL LOW (ref 6.0–8.0)
SODIUM: 125 mmol/L — ABNORMAL LOW (ref 136–145)

## 2020-02-27 LAB — BASIC METABOLIC PANEL
ANION GAP: 5 mmol/L (ref 4–13)
BUN/CREA RATIO: 25 — ABNORMAL HIGH (ref 6–22)
BUN: 34 mg/dL — ABNORMAL HIGH (ref 8–25)
CALCIUM: 8.1 mg/dL — ABNORMAL LOW (ref 8.8–10.2)
CHLORIDE: 96 mmol/L (ref 96–111)
CO2 TOTAL: 23 mmol/L (ref 23–31)
CREATININE: 1.34 mg/dL — ABNORMAL HIGH (ref 0.60–1.05)
ESTIMATED GFR: 40 mL/min/BSA — ABNORMAL LOW (ref >=60)
GLUCOSE: 246 mg/dL — ABNORMAL HIGH (ref 65–125)
POTASSIUM: 6.1 mmol/L (ref 3.5–5.1)
SODIUM: 124 mmol/L — ABNORMAL LOW (ref 136–145)

## 2020-02-27 LAB — URINALYSIS, MACROSCOPIC
BILIRUBIN: NEGATIVE mg/dL
BLOOD: NEGATIVE mg/dL
GLUCOSE: NEGATIVE mg/dL
KETONES: NEGATIVE mg/dL
LEUKOCYTES: NEGATIVE WBCs/uL
NITRITE: NEGATIVE
PH: 5.5 (ref 5.0–8.5)
PROTEIN: 30 mg/dL — AB
SPECIFIC GRAVITY: 1.01 (ref 1.005–1.030)
UROBILINOGEN: 0.2 mg/dL

## 2020-02-27 LAB — CBC WITH DIFF
BASOPHIL #: <0.1 10*3/uL (ref <=0.20)
BASOPHIL %: 0 %
EOSINOPHIL #: 0.1 10*3/uL (ref <=0.50)
EOSINOPHIL %: 2 %
HCT: 25.5 % — ABNORMAL LOW (ref 34.8–46.0)
HGB: 8 g/dL — ABNORMAL LOW (ref 11.5–16.0)
IMMATURE GRANULOCYTE #: <0.1 10*3/uL (ref <0.10)
IMMATURE GRANULOCYTE %: 1 % (ref 0–1)
LYMPHOCYTE #: 1.48 10*3/uL (ref 1.00–4.80)
LYMPHOCYTE %: 31 %
MCH: 31.1 pg (ref 26.0–32.0)
MCHC: 31.4 g/dL (ref 31.0–35.5)
MCV: 99.2 fL (ref 78.0–100.0)
MONOCYTE #: 0.54 10*3/uL (ref 0.20–1.10)
MONOCYTE %: 11 %
MPV: 11.4 fL (ref 8.7–12.5)
NEUTROPHIL #: 2.57 10*3/uL (ref 1.50–7.70)
NEUTROPHIL %: 55 %
PLATELETS: 106 10*3/uL — ABNORMAL LOW (ref 150–400)
RBC: 2.57 10*6/uL — ABNORMAL LOW (ref 3.85–5.22)
RDW-CV: 16.4 % — ABNORMAL HIGH (ref 11.5–15.5)
WBC: 4.7 10*3/uL (ref 3.7–11.0)

## 2020-02-27 LAB — COVID-19, FLU A/B, RSV RAPID BY PCR
INFLUENZA VIRUS TYPE A: NOT DETECTED
INFLUENZA VIRUS TYPE B: NOT DETECTED
RESPIRATORY SYNCTIAL VIRUS (RSV): NOT DETECTED
SARS-CoV-2: NOT DETECTED

## 2020-02-27 LAB — URINALYSIS, MICROSCOPIC

## 2020-02-27 LAB — MAGNESIUM: MAGNESIUM: 1.8 mg/dL (ref 1.8–2.6)

## 2020-02-27 LAB — GOLD TOP TUBE

## 2020-02-27 LAB — GRAY TOP TUBE

## 2020-02-27 LAB — LACTIC ACID LEVEL W/ REFLEX FOR LEVEL >2.0: LACTIC ACID: 1.1 mmol/L (ref 0.5–2.2)

## 2020-02-27 LAB — BLUE TOP TUBE

## 2020-02-27 MED ORDER — SODIUM CHLORIDE 0.9 % (FLUSH) INJECTION SYRINGE
20.00 mL | INJECTION | INTRAMUSCULAR | Status: DC | PRN
Start: 2020-02-27 — End: 2020-02-27

## 2020-02-27 MED ORDER — HEPARIN, PORCINE (PF) 100 UNIT/ML INTRAVENOUS SYRINGE
5.00 mL | INJECTION | INTRAVENOUS | Status: AC
Start: 2020-02-27 — End: 2020-02-27
  Administered 2020-02-27: 21:00:00 5 mL
  Filled 2020-02-27: qty 5

## 2020-02-27 MED ORDER — SODIUM CHLORIDE 0.9 % (FLUSH) INJECTION SYRINGE
10.00 mL | INJECTION | Freq: Three times a day (TID) | INTRAMUSCULAR | Status: DC
Start: 2020-02-27 — End: 2020-02-27

## 2020-02-27 MED ORDER — SODIUM CHLORIDE 0.9 % IV BOLUS
1000.00 mL | INJECTION | Status: AC
Start: 2020-02-27 — End: 2020-02-27
  Administered 2020-02-27: 0 mL via INTRAVENOUS
  Administered 2020-02-27: 1000 mL via INTRAVENOUS

## 2020-02-27 MED ORDER — HEPARIN, PORCINE (PF) 100 UNIT/ML INTRAVENOUS SYRINGE
1.00 mL | INJECTION | INTRAVENOUS | Status: AC
Start: 2020-02-27 — End: 2020-02-27
  Administered 2020-02-27: 5 mL
  Filled 2020-02-27: qty 5

## 2020-02-27 NOTE — ED Nurses Note (Signed)
Pt to car via wheelchair.  Home in stable condition.

## 2020-02-27 NOTE — ED Nurses Note (Signed)
Pt resting with her son at side.  Has been up to bedside commode to void and drank a ginger ale.

## 2020-02-27 NOTE — ED Nurses Note (Signed)
Resting. Report to [facility] RN. Call light in reach.

## 2020-02-27 NOTE — ED Provider Notes (Signed)
[facility]  Emergency Department  Provider Note      [name]  1946/01/23  74 y.o.  female  [place] [address]   [phone] (home)  Myrene Buddy, MD    Chief Complaint:   Chief Complaint   Patient presents with   . Abdominal Pain     RLQ   . Abnormal Lab Result     K 6.1       HPI: This is a 74 y.o. female who presents to the emergency department via pov with reports of low potassium.  Patient reports that she had in-home nursing come in jaw her labs today.  She was called by her primary provider and instructed to come to the ER for an elevated potassium 6.1.  Patient does complain of some chills but states she is cold at all times.  She denies upper respiratory symptoms, urinary complaints or abdominal pain.  Patient does currently have metastatic pancreatic cancer.  This has metastasized to her lungs.  She has not been on chemotherapy for the past 2-3 months secondary to anemia.    COVID screening:  Patient denies shortness of breath, cough, and lost of taste or smell.  Patient has had no recent travel or known COVID exposures.    Past Medical History:   Past Medical History:   Diagnosis Date   . Abdominal hernia 10/18/2017    hx of repair   . Anxiety    . Arthritis    . Asthma    . Atrial fibrillation (CMS HCC)    . Back problem    . Bruises easily    . Cancer (CMS [place]) 10/18/2017    chemo and radiation completed 09/24/2017   . COPD (chronic obstructive pulmonary disease) (CMS HCC)    . CPAP (continuous positive airway pressure) dependence 10/18/2017    has not used recently   . Depression    . DM (diabetes mellitus) (CMS HCC) 2000    Fasting BG 200's. HGA1C 2018 6   . Edema    . Essential hypertension    . GERD (gastroesophageal reflux disease)    . Headache    . Heartburn    . Hx antineoplastic chemo 2018   . Hx of radiation therapy 2018   . Hypercholesteremia    . Hyperlipidemia    . Migraine 10/18/2017    none recently   . Obesity    . Palpitations    . Pancreatic  cancer (CMS [place]) 03/2017   . Panic attack    . PE (pulmonary thromboembolism) (CMS HCC) 03/29/2018   . Peripheral neuropathy 10/18/2017    knees down, bilateral   . Sepsis (CMS [place]) 04/15/2019   . Sleep apnea    . Type 2 diabetes mellitus (CMS HCC) 10/18/2017    Dx 2000 FBS 200s   . Unintentional weight loss 10/18/2017    25 lbs 03/2017   . UTI (urinary tract infection)    . Wears glasses        Past Surgical History:   Past Surgical History:   Procedure Laterality Date   . Cesarean section     . Colonoscopy  10/04/2019   . Hx cesarean section     . Hx cholecystectomy     . Hx hernia repair     . Hx subclavian port implantion     . Hx subclavian port implantion     . Hx tonsil and adenoidectomy     . Hx tonsillectomy     .  Pancreaticoduodenectomy  2018   . Umbilical hernia repair     . Whipple procedure w/ laparoscopy         Social History:   Social History     Tobacco Use   . Smoking status: Former Smoker     Packs/day: 0.50     Years: 20.00     Pack years: 10.00     Types: Cigarettes   . Smokeless tobacco: Never Used   . Tobacco comment: unsure of quit date   Vaping Use   . Vaping Use: Never used   Substance Use Topics   . Alcohol use: Never   . Drug use: Never      Social History     Substance and Sexual Activity   Drug Use Never       Allergies:   Allergies   Allergen Reactions   . Sulfa (Sulfonamides) Itching       Medications:   Prior to Admission medications    Medication Sig Start Date End Date Taking? Authorizing Provider   Acetaminophen-Butalbital 50-325 mg Oral Tablet Take by mouth    Provider, Historical   aspirin (ECOTRIN) 81 mg Oral Tablet, Delayed Release (E.C.) Take 1 Tab (81 mg total) by mouth Once a day 04/20/19   Hyacinth Meeker, MD   atorvastatin (LIPITOR) 10 mg Oral Tablet TAKE 1 TABLET BY MOUTH ONCE DAILY 01/13/20   Ignacia Felling, MD   cetirizine (ZYRTEC) 10 mg Oral Tablet Take 1 Tab (10 mg total) by mouth Once a day 04/20/19   Hyacinth Meeker, MD   dilTIAZem (CARDIZEM) 30 mg Oral  Tablet Take 30 mg by mouth Four times a day    Provider, Historical   furosemide (LASIX) 80 mg Oral Tablet Take 1 Tablet (80 mg total) by mouth Once a day for 30 days 02/25/20 03/26/20  Myrene Buddy, MD   hydrALAZINE (APRESOLINE) 50 mg Oral Tablet Take 0.5 Tablets (25 mg total) by mouth Twice daily for 30 days 02/11/20 03/12/20  Myrene Buddy, MD   HYDROcodone-acetaminophen (NORCO) 7.5-325 mg Oral Tablet TAKE 1 TABLET BY MOUTH EVERY 6 HOURS AS NEEDED FOR PAIN 02/19/20   Provider, Historical   insulin aspart U-100 (NOVOLOG FLEXPEN U-100 INSULIN) 100 unit/mL (3 mL) Subcutaneous Insulin Pen 7 Units by Subcutaneous route Twice a day before meals  Patient taking differently: by Subcutaneous route Twice a day before meals Per sliding scale 01/22/20   Charlie Pitter, MD   insulin glargine (LANTUS SOLOSTAR U-100 INSULIN) 100 unit/mL Subcutaneous Insulin Pen 10 Units by Subcutaneous route Every night 11/25/19   Myrene Buddy, MD   lidocaine HCL (XYLOCAINE) 2 % Mucous Membrane Solution 5 mL 05/02/17   Provider, Historical   LORazepam (ATIVAN) 0.5 mg Oral Tablet Take 0.5 mg by mouth 07/03/17   Provider, Historical   losartan (COZAAR) 100 mg Oral Tablet Take 1 Tablet (100 mg total) by mouth Once a day 02/07/20   Bethanie Dicker, MD   magnesium oxide (MAG-OX) 400 mg Oral Tablet Take 400 mg by mouth Twice daily    Provider, Historical   methylnaltrexone (RELISTOR) 150 mg Oral Tablet Relistor 150 mg tablet   TAKE 3 TABLETS BY MOUTH DAILY    Provider, Historical   metoprolol succinate (TOPROL-XL) 50 mg Oral Tablet Sustained Release 24 hr Take 1 Tablet (50 mg total) by mouth Once a day 02/07/20   Bethanie Dicker, MD   NANO PEN NEEDLE 32 gauge x 5/32" Needle USE WITH INSULIN FLEXPEN BID  AS NEEDED 08/06/19   Provider, Historical   omeprazole (PRILOSEC) 40 mg Oral Capsule, Delayed Release(E.C.) Take 1 Cap (40 mg total) by mouth Once a day 02/07/18   Nicola Police, MD   pancreatic enzyme replacement (CREON) 12,000-38,000 -60,000 unit  Oral Capsule, Delayed Release(E.C.) Take 2 Capsules by mouth Three times daily with meals 2 CAPS WITH MEALS AND 1 CAP WITH EACH [facility] 02/17/20   Myrene Buddy, MD   sodium chloride 1 gram Oral Tablet sodium chloride 1 gram tablet    Provider, Historical   spironolactone (ALDACTONE) 25 mg Oral Tablet Take 1 Tablet (25 mg total) by mouth Once a day for 180 days 02/11/20 08/09/20  Myrene Buddy, MD   tiotropium bromide (SPIRIVA HANDIHALER) 18 mcg Inhalation Capsule, w/Inhalation Device Take 1 Capsule (18 mcg total) by inhalation Once a day for 180 days 02/24/20 08/22/20  Myrene Buddy, MD       Above history, allergies and medication list reviewed with patient.      Review of Systems:  Consitutional:  Negative for fever.  Positive for chills.  Skin:  Negative for rashes or other lesions.  HENT:  Negative for headaches.  Eyes:  Negative for blurry vision or double vision.  No acute vision loss.  Cardiovascular:  Negative for chest pain and palpitations.  Respiratory:  Negative for cough, shortness of breath and wheezing.  Gastrointestinal:  Negative for nausea, vomiting and abdominal pain.  No diarrhea, constipation, melena or hematochezia.  Musculoskeletal:  Negative for neck pain and back pain.  Neurological:  Negative for dizziness and loss of consciousness.  No numbness, tingling or other sensation changes.  No acute localized weakness.  Psych:  Negative for anxiety or depression.  No suicidal ideations or attempts.  No homicidal ideations or attempts.  Genitourinary:  Negative for dysuria, hematuria and flank pain.  All other systems reviewed and negative.      Physical Exam  Body mass index is 30.56 kg/m.  ED Triage Vitals [02/27/20 1706]   BP (Non-Invasive) (!) 160/56   Heart Rate 75   Respiratory Rate 19   Temperature 37.5 C (99.5 F)   SpO2 99 %   Weight 75.8 kg (167 lb 1.7 oz)   Height 1.575 m (5' 2.01")     Constitutional: patient is oriented to person, place, and time  and in no distress.  Patient is lying in the  bed in cardiac 1. She does not appear toxic but appears chronically ill.  HENT:   Mask in place.  Head: Normocephalic and atraumatic.   Right Ear: External ear normal.   Left Ear: External ear normal.   Nose: Nose normal.   Mouth/Throat: Oropharynx is clear and moist.   Eyes: Pupils are equal, round, and reactive to light. Conjunctivae and EOM are normal.   Neck: Normal range of motion. Neck supple.  Trachea midline.  Cardiovascular: Normal rate, regular rhythm, normal heart sounds and intact distal pulses.   Pulmonary/Chest: Effort normal and breath sounds normal.  No chest wall deformity.  Abdominal: Soft. Bowel sounds are normal.  No rebound tenderness or guarding.  Musculoskeletal: Normal range of motion for patient's baseline.  Neurological: Patient is alert and oriented to person, place, and time. GCS score is 15. Gait is at patient's baseline.  Skin: Skin is warm and dry.   Psychiatric: Mood, memory, affect and judgment normal.        ED Course/ MDM/ Plan:   Patient was triaged, vital signs were  obtained, patient was  placed in a room.  On exam, patient is alert and oriented, nontoxic on exam, and in no acute distress. Vitals were reviewed. The following orders were placed after examining the patient and the following results were reviewed by me.    Orders Placed This Encounter   . CANCELED: ADULT ROUTINE BLOOD CULTURE, SET OF 2 BOTTLES (BACTERIA AND YEAST)   . CANCELED: ADULT ROUTINE BLOOD CULTURE, SET OF 2 BOTTLES (BACTERIA AND YEAST)   . XR AP MOBILE CHEST   . CBC/DIFF   . COMPREHENSIVE METABOLIC PANEL, NON-FASTING   . MAGNESIUM   . CBC WITH DIFF   . URINALYSIS, MACROSCOPIC AND MICROSCOPIC   . LACTIC ACID LEVEL W/ REFLEX FOR LEVEL >2.0   . COVID-19 SCREENING INCLUDING FLU A/B, RSV RAPID BY PCR   . URINALYSIS, MACROSCOPIC   . URINALYSIS, MICROSCOPIC   . EXTRA TUBES   . BLUE TOP TUBE   . GOLD TOP TUBE   . GRAY TOP TUBE   . ECG 12-LEAD   . CANCELED: MAINTAIN & ACCESS SUBCUTANEOUS IV PORT   . CANCELED: CENTRAL  VENOUS CATHETER DRESSING CHANGE WEEKLY AND PRN   . heparin flush PF (HEPLOCK) 100 units/mL injection   . NS bolus infusion 1,000 mL   . heparin flush PF (HEPLOCK) 100 units/mL injection     XR AP MOBILE CHEST   ED Interpretation   No acute intrathoracic findings indicative of infectious processes.      Final Result   No acute intrathoracic disease.      This note was partially created using voice recognition software and is   inherently subject to errors including those of syntax and "sound alike "   substitutions which may escape proof reading.  In such instances, original   meaning may be extrapolated by contextual derivation.         Radiologist location ID: [id number]           Labs Reviewed   COMPREHENSIVE METABOLIC PANEL, NON-FASTING - Abnormal; Notable for the following components:       Result Value    SODIUM 125 (*)     POTASSIUM 5.5 (*)     CO2 TOTAL 20 (*)     BUN 33 (*)     CREATININE 1.40 (*)     BUN/CREA RATIO 24 (*)     ESTIMATED GFR 38 (*)     ALBUMIN 2.3 (*)     CALCIUM 7.6 (*)     GLUCOSE 316 (*)     ALKALINE PHOSPHATASE 148 (*)     ALT (SGPT) 26 (*)     PROTEIN TOTAL 4.5 (*)     All other components within normal limits   CBC WITH DIFF - Abnormal; Notable for the following components:    RBC 2.57 (*)     HGB 8.0 (*)     HCT 25.5 (*)     RDW-CV 16.4 (*)     PLATELETS 106 (*)     All other components within normal limits   URINALYSIS, MACROSCOPIC - Abnormal; Notable for the following components:    PROTEIN 30  (*)     All other components within normal limits   URINALYSIS, MICROSCOPIC - Abnormal; Notable for the following components:    BACTERIA Occasional (*)     MUCOUS Occasional (*)     All other components within normal limits   MAGNESIUM - Normal   LACTIC ACID LEVEL W/ REFLEX FOR LEVEL >2.0 -  Normal   COVID-19, FLU A/B, RSV RAPID BY PCR - Normal    Narrative:     Results are for the simultaneous qualitative identification of SARS-CoV-2 (formerly 2019-nCoV), Influenza A, Influenza B, and RSV RNA.  These etiologic agents are generally detectable in nasopharyngeal and nasal swabs during the ACUTE PHASE of infection. Hence, this test is intended to be performed on respiratory specimens collected from individuals with signs and symptoms of upper respiratory tract infection who meet Centers for Disease Control and Prevention (CDC) clinical and/or epidemiological criteria for Coronavirus Disease 2019 (COVID-19) testing. CDC COVID-19 criteria for testing on human specimens is available at [facility] webpage information for Healthcare Professionals: Coronavirus Disease 2019 (COVID-19) ([employer]).     False-negative results may occur if the virus has genomic mutations, insertions, deletions, or rearrangements or if performed very early in the course of illness. Otherwise, negative results indicate virus specific RNA targets are not detected, however negative results do not preclude SARS-CoV-2 infection/COVID-19, Influenza, or Respiratory syncytial virus infection. Results should not be used as the sole basis for patient management decisions. Negative results must be combined with clinical observations, patient history, and epidemiological information. If upper respiratory tract infection is still suspected based on exposure history together with other clinical findings, re-testing should be considered.    Disclaimer:   This assay has been authorized by FDA under an Emergency Use Authorization for use in laboratories certified under the Clinical Laboratory Improvement Amendments of 1988 (CLIA), 42 U.S.C. 7815128260, to perform high complexity tests. The impacts of vaccines, antiviral therapeutics, antibiotics, chemotherapeutic or immunosuppressant drugs have not been evaluated.     Test methodology:   Cepheid Xpert Xpress SARS-CoV-2/Flu/RSV Assay real-time polymerase chain reaction (RT-PCR) test on the GeneXpert Dx and Xpert Xpress systems.   ADULT ROUTINE BLOOD CULTURE, SET OF 2  BOTTLES (BACTERIA AND YEAST)   ADULT ROUTINE BLOOD CULTURE, SET OF 2 BOTTLES (BACTERIA AND YEAST)   CBC/DIFF    Narrative:     The following orders were created for panel order CBC/DIFF.  Procedure                               Abnormality         Status                     ---------                               -----------         ------                     CBC WITH VCBS[[id number]]                Abnormal            Final result                 Please view results for these tests on the individual orders.   URINALYSIS, MACROSCOPIC AND MICROSCOPIC    Narrative:     The following orders were created for panel order URINALYSIS, MACROSCOPIC AND MICROSCOPIC.  Procedure                               Abnormality         Status                     ---------                               -----------         ------  URINALYSIS, MACROSCOPIC[[id number]]      Abnormal            Final result               URINALYSIS, MICROSCOPIC[[id number]]      Abnormal            Final result                 Please view results for these tests on the individual orders.   BLUE TOP TUBE   GRAY TOP TUBE     No results found for this or any previous visit (from the past 12 hour(s)).  XR AP MOBILE CHEST   ED Interpretation by Kendrick Fries, PA-C (03/18 2057)   No acute intrathoracic findings indicative of infectious processes.      Final Result by Edi, Radresults In (03/18 2117)   No acute intrathoracic disease.      This note was partially created using voice recognition software and is   inherently subject to errors including those of syntax and "sound alike "   substitutions which may escape proof reading.  In such instances, original   meaning may be extrapolated by contextual derivation.         Radiologist location ID: [id number]               The patient received the following medications while in the ED (also noted above):  Medications   heparin flush PF (HEPLOCK) 100 units/mL injection (5 mL Intracatheter Given  02/27/20 1746)   NS bolus infusion 1,000 mL (0 mL Intravenous Stopped 02/27/20 2033)   heparin flush PF (HEPLOCK) 100 units/mL injection (5 mL Intracatheter Given 02/27/20 2100)       ED Course as of Feb 27 1337   Thu Feb 27, 2020   1847 Unremarkable   LACTIC ACID: 1.1 [CT]   1847 Hemoglobin is low at 8.  Hematocrit low at 25.5.  Patient is a known anemic secondary to cancer and chemotherapy.  Platelets are low at 106.     [CT]   1847 Within normal limits   MAGNESIUM: 1.8 [CT]   1847 Sodium low 125. Potassium is elevated 5.5.  CO2 is low at 20.  BUN is elevated at 33 and creatinine is elevated 1.4.  Albumin is low at 2.3.  Calcium is low at 7.6.  With his elevated 360.  Alk-phos is elevated 148. ALT is elevated 26 and total protein is low at 4.5.     COMPREHENSIVE METABOLIC PANEL, NON-FASTING(!) [CT]   1848 Artifact is noted.  Normal sinus rhythm with sinus arrhythmia and first-degree AV block.  70 beats per minute.  No acute T-wave changes seen.  These appear to be patient's baseline.  I will give her a L of fluids with the elevation in her potassium.   ECG 12-LEAD [CT]   1916 Negative    COVID-19 SCREENING INCLUDING FLU A/B, RSV RAPID BY PCR [CT]      ED Course User Index  [CT] Thayer-Hughes, Callie, PA-C         Procedures  None    Patient's potassium was repeated to rule out hemolysis of the in the blood draw.  A potassium was elevated at 5.5.  Patient does have a poor oral intake due to her illness so she was given a L of fluids.  There were no changes on her EKG indicative of the hyperkalemia.  I did do a sepsis workup and did not find any  source of infection.  I did see the patient was on spironolactone which is a potassium-sparing diuretic.  I advised patient to hold her a potassium-sparing diuretic for 3 days and to have her potassium rechecked with her PCP.  Patient was hemodynamically stable neurovascularly intact for discharge home.  I discussed the treatment plan with the patient and she was in agreement.   Patient is aware that her PCP can instruct her to reinitiate her spironolactone.    Critical Care:     Not applicable.      Clinical Impression:   Encounter Diagnoses   Name Primary?   . Hyperkalemia Yes   . Congestive heart failure, unspecified HF chronicity, unspecified heart failure type (CMS HCC)    . Essential hypertension    . Type 2 diabetes mellitus (CMS HCC)        Following the above history, physical exam and studies, the patient was deemed stable and suitable for discharge.  Discharge and medication instructions were discussed with the patient and all questions were addressed. The patient understands that they may return to the ED at any time for new or worsening symptoms including Worsening symptoms or if they have any other concerns    Disposition: Transfered to Another Facility  Discharge Medication List as of 02/27/2020  9:07 PM         Myrene Buddy, MD  [address]  [place] [address]  [phone]    In 3 days  To have your potassium rechecked.    [facility]  [address]  [place] [address]  [phone]    As needed, If symptoms worsen     BP (!) 171/59   Pulse 68   Temp 37.5 C (99.5 F)   Resp 18   Ht 1.575 m (5' 2.01")   Wt 75.8 kg (167 lb 1.7 oz)   SpO2 100%   BMI 30.56 kg/m          Callie Thayer-Hughes, PA-C       This note was partially created using voice recognition software and is inherently subject to errors including those of syntax and "sound alike " substitutions which may escape proof reading.  In such instances, original meaning may be extrapolated by contextual derivation.      I did not personally see or evaluate this patient but was immediately available for consultation as deemed necessary by the midlevel provider.    Joaquim Lai, DO  02/28/2020, 13:58

## 2020-02-27 NOTE — Telephone Encounter (Signed)
Dr. Elta Guadeloupe: I spoke with you earlier regarding [name] from [facility] lab calling this afternoon with a critical potassium level of 6.1 - see in chart. You ordered via v/o to have patient go to the emergency room for evaluation.    Spoke with patient's son Ruthann Cancer and another family member - they understood and are taking the patient now. Jettie Pagan, RN

## 2020-02-27 NOTE — ED Nurses Note (Signed)
Pt assisted to bedside commode and with dressing.

## 2020-02-27 NOTE — ED Nurses Note (Signed)
pcxr complete

## 2020-02-27 NOTE — Discharge Instructions (Addendum)
General Instructions:  Today you were seen in the Emergency Department. You are considered stable for discharge from the emergency department. Please carefully follow all the instructions you were given verbally as well as the written instructions given below. Immediately return to the emergency department if the symptoms you presented with today increase in severity, change in any way, and/or do not improve in what you consider an acceptable time frame.  Return if you develop fever >101, vomiting, oral liquid intolerance, chest pain, shortness of breath, weakness, change in behavior, or any other concerns.    Medication(s) Instructions (IF INDICATED):  HOLD SPIRONOLACTONE FOR 3 DAYS AND HAVE YOUR POTASSIUM RECHECK IT TO SEE IF YOU CAN RESTART YOUR SPIRONOLACTONE.  Indications, contraindications, adverse effects, potential side effects, and medication instructions were discussed with the patient/patient's family prior to discharge. Please strictly follow the directions as prescribed for taking the medication(s). Should you feel you develop any type of reaction to the medication(s), including, but not limited to, rash, swelling of the lips or face, or difficulty breathing, immediately discontinue the use of the medication(s) and seek prompt medical care. Please read the medication(s) insert provided by the pharmacy and follow all guidelines and recommendations. Please take all medications as prescribed for your symptoms or diagnoses. Please continue current medications as directed unless stated otherwise.                Follow-Up Instructions:  It is your responsibility to call and/or make an appointment with the health care providers listed on your discharge papers and/or your primary care provider in the appropriate time frame given or earlier if needed for your symptoms.  Please take a copy of your discharge papers with you to your follow-up appointment(s). Please follow up with any specialist provider in clinic  that your were given instructions to see as an outpatient.     YOU MUST CALL THE NUMBER LISTED ON THE DISCHARGE PAPERWORK TO SCHEDULE OR CONFIRM YOUR APPOINTMENT.    Special Information / Instructions:  Please read and follow all attached discharge instructions.      Return Precautions:  Patient/patient's family was advised to return to the ED with any new, concerning or worsening symptoms . The patient/patient's family verbalized understanding of all instructions, were given the opportunity to ask questions, and had no further questions or concerns.     Call the [facility] Emergency Department of [facility] at [phone] with any questions or concerns.    Return to the Emergency Department if you have persistence, worsening of your symptoms, or any new concerning symptoms.    Thank you for allowing me to take part in your care.    [employer], PA-C  02/27/2020, 21:05

## 2020-02-27 NOTE — Progress Notes (Signed)
Please call the patient tell from the test results were abnormal.  Patient go to the emergency room per our discussion

## 2020-02-27 NOTE — ED Triage Notes (Addendum)
Pt arrives to ED with adult son who reports that they were called by [facility] and told to come to ED r/t potassium of 6.1  Pt hx of pancreatic CA and mestastisized to lung. Pt unable to continue with chemotherapy for past two to three months r/t anemia

## 2020-02-28 LAB — ECG 12-LEAD
Atrial Rate: 70 {beats}/min
Calculated P Axis: 42 degrees
Calculated R Axis: -6 degrees
Calculated T Axis: 81 degrees
PR Interval: 240 ms
QRS Duration: 84 ms
QT Interval: 380 ms
QTC Calculation: 410 ms
Ventricular rate: 70 {beats}/min

## 2020-03-02 ENCOUNTER — Telehealth (INDEPENDENT_AMBULATORY_CARE_PROVIDER_SITE_OTHER): Payer: Self-pay | Admitting: FAMILY PRACTICE

## 2020-03-02 ENCOUNTER — Other Ambulatory Visit (INDEPENDENT_AMBULATORY_CARE_PROVIDER_SITE_OTHER): Payer: Self-pay | Admitting: Family Medicine

## 2020-03-02 ENCOUNTER — Other Ambulatory Visit (INDEPENDENT_AMBULATORY_CARE_PROVIDER_SITE_OTHER): Payer: Self-pay | Admitting: FAMILY PRACTICE

## 2020-03-02 ENCOUNTER — Encounter (INDEPENDENT_AMBULATORY_CARE_PROVIDER_SITE_OTHER): Payer: Self-pay | Admitting: FAMILY PRACTICE

## 2020-03-02 MED ORDER — MAGNESIUM OXIDE 400 MG (241.3 MG MAGNESIUM) TABLET
400.00 mg | ORAL_TABLET | Freq: Two times a day (BID) | ORAL | 5 refills | Status: AC
Start: 2020-03-02 — End: 2020-04-01

## 2020-03-02 MED ORDER — RELISTOR 150 MG TABLET
ORAL_TABLET | ORAL | 5 refills | Status: DC
Start: 2020-03-02 — End: 2020-04-16

## 2020-03-02 MED ORDER — SODIUM CHLORIDE 1 GRAM TABLET
1.00 g | ORAL_TABLET | Freq: Three times a day (TID) | ORAL | 5 refills | Status: DC
Start: 2020-03-02 — End: 2020-07-30

## 2020-03-02 MED ORDER — RELISTOR 150 MG TABLET
ORAL_TABLET | ORAL | 5 refills | Status: DC
Start: 2020-03-02 — End: 2020-03-02

## 2020-03-02 MED ORDER — LORAZEPAM 0.5 MG TABLET
0.50 mg | ORAL_TABLET | Freq: Every evening | ORAL | 2 refills | Status: DC
Start: 2020-03-02 — End: 2020-07-03

## 2020-03-02 MED ORDER — DILTIAZEM 30 MG TABLET
30.00 mg | ORAL_TABLET | Freq: Four times a day (QID) | ORAL | 5 refills | Status: DC
Start: 2020-03-02 — End: 2020-08-25

## 2020-03-02 NOTE — Telephone Encounter (Signed)
Spoke with patient's son and family on 02-27-20 via phone at [place] pm. Relayed results and orders that patient needed to be taken to the emergency room. They were taking the patient at that time. Jettie Pagan, RN

## 2020-03-02 NOTE — Telephone Encounter (Signed)
-----   Message from Myrene Buddy, MD sent at 02/27/2020  4:54 PM EDT -----  Please call the patient tell from the test results were abnormal.  Patient go to the emergency room per our discussion

## 2020-03-02 NOTE — Progress Notes (Signed)
[facility] was last reviewed on 02/05/2020  1:53 PM by Gerlean Ren L and result was REVIEWED PDMP.     Based on this, I have no concerns for prescribing controlled substances at this time.  Rx is refilled and sent for this patient

## 2020-03-03 LAB — ADULT ROUTINE BLOOD CULTURE, SET OF 2 BOTTLES (BACTERIA AND YEAST)
BLOOD CULTURE, ROUTINE: NO GROWTH
BLOOD CULTURE, ROUTINE: NO GROWTH

## 2020-03-06 ENCOUNTER — Telehealth (INDEPENDENT_AMBULATORY_CARE_PROVIDER_SITE_OTHER): Payer: Self-pay | Admitting: FAMILY PRACTICE

## 2020-03-06 NOTE — Telephone Encounter (Signed)
It is okay with me for her to do the extra visits since she request

## 2020-03-06 NOTE — Nursing Note (Signed)
Dr. Marlis Edelson the social worker with [facility] called she would like to do 10 more visits if ok?  Also her daughter-in- law would like to be her care giver through [employer] so we are to let them know.  Charolotte Capuchin, RN

## 2020-03-10 ENCOUNTER — Telehealth (INDEPENDENT_AMBULATORY_CARE_PROVIDER_SITE_OTHER): Payer: Self-pay | Admitting: FAMILY PRACTICE

## 2020-03-10 NOTE — Telephone Encounter (Signed)
Noted  [facility] [place] notified to do their best in lab draws    Lucretia Field, RN

## 2020-03-10 NOTE — Telephone Encounter (Signed)
I would avoid trying to have this patient hydrate too much his as part of her problem with her anasarca

## 2020-03-10 NOTE — Telephone Encounter (Signed)
Nurse Anderson Malta with [facility] called    She reports patients lower extremities are red and with pitting edema    She was not very hydrated and was unable to get any blood to flow for labs this morning    She will try and have ptient hydrate and try again if needed.    Please fax [facility] any orders or suggestions    Lucretia Field, RN

## 2020-03-13 ENCOUNTER — Other Ambulatory Visit: Payer: Self-pay

## 2020-03-13 ENCOUNTER — Encounter (HOSPITAL_COMMUNITY): Payer: Self-pay

## 2020-03-13 ENCOUNTER — Inpatient Hospital Stay
Admission: EM | Admit: 2020-03-13 | Discharge: 2020-03-16 | DRG: 641 | Disposition: A | Discharge status: Home Health | Payer: Medicare Other | Attending: Family Medicine | Admitting: Family Medicine

## 2020-03-13 ENCOUNTER — Emergency Department (HOSPITAL_COMMUNITY): Payer: Medicare Other

## 2020-03-13 DIAGNOSIS — I509 Heart failure, unspecified: Secondary | ICD-10-CM | POA: Diagnosis present

## 2020-03-13 DIAGNOSIS — K7460 Unspecified cirrhosis of liver: Secondary | ICD-10-CM | POA: Diagnosis present

## 2020-03-13 DIAGNOSIS — F329 Major depressive disorder, single episode, unspecified: Secondary | ICD-10-CM | POA: Diagnosis present

## 2020-03-13 DIAGNOSIS — Z20822 Contact with and (suspected) exposure to covid-19: Secondary | ICD-10-CM | POA: Diagnosis present

## 2020-03-13 DIAGNOSIS — Z9989 Dependence on other enabling machines and devices: Secondary | ICD-10-CM

## 2020-03-13 DIAGNOSIS — E871 Hypo-osmolality and hyponatremia: Secondary | ICD-10-CM

## 2020-03-13 DIAGNOSIS — E785 Hyperlipidemia, unspecified: Secondary | ICD-10-CM | POA: Insufficient documentation

## 2020-03-13 DIAGNOSIS — E7800 Pure hypercholesterolemia, unspecified: Secondary | ICD-10-CM | POA: Diagnosis present

## 2020-03-13 DIAGNOSIS — I4811 Longstanding persistent atrial fibrillation: Secondary | ICD-10-CM | POA: Diagnosis present

## 2020-03-13 DIAGNOSIS — J449 Chronic obstructive pulmonary disease, unspecified: Secondary | ICD-10-CM

## 2020-03-13 DIAGNOSIS — Z86711 Personal history of pulmonary embolism: Secondary | ICD-10-CM

## 2020-03-13 DIAGNOSIS — Z79899 Other long term (current) drug therapy: Secondary | ICD-10-CM

## 2020-03-13 DIAGNOSIS — E119 Type 2 diabetes mellitus without complications: Secondary | ICD-10-CM | POA: Insufficient documentation

## 2020-03-13 DIAGNOSIS — C259 Malignant neoplasm of pancreas, unspecified: Secondary | ICD-10-CM | POA: Diagnosis present

## 2020-03-13 DIAGNOSIS — E876 Hypokalemia: Secondary | ICD-10-CM | POA: Diagnosis present

## 2020-03-13 DIAGNOSIS — Z87891 Personal history of nicotine dependence: Secondary | ICD-10-CM | POA: Insufficient documentation

## 2020-03-13 DIAGNOSIS — F325 Major depressive disorder, single episode, in full remission: Secondary | ICD-10-CM

## 2020-03-13 DIAGNOSIS — R188 Other ascites: Secondary | ICD-10-CM | POA: Insufficient documentation

## 2020-03-13 DIAGNOSIS — G4730 Sleep apnea, unspecified: Secondary | ICD-10-CM | POA: Diagnosis present

## 2020-03-13 DIAGNOSIS — R531 Weakness: Secondary | ICD-10-CM

## 2020-03-13 DIAGNOSIS — Z90411 Acquired partial absence of pancreas: Secondary | ICD-10-CM

## 2020-03-13 DIAGNOSIS — Z794 Long term (current) use of insulin: Secondary | ICD-10-CM | POA: Insufficient documentation

## 2020-03-13 DIAGNOSIS — E1142 Type 2 diabetes mellitus with diabetic polyneuropathy: Secondary | ICD-10-CM

## 2020-03-13 DIAGNOSIS — K219 Gastro-esophageal reflux disease without esophagitis: Secondary | ICD-10-CM | POA: Diagnosis present

## 2020-03-13 DIAGNOSIS — Z7982 Long term (current) use of aspirin: Secondary | ICD-10-CM | POA: Insufficient documentation

## 2020-03-13 DIAGNOSIS — I110 Hypertensive heart disease with heart failure: Secondary | ICD-10-CM | POA: Insufficient documentation

## 2020-03-13 DIAGNOSIS — I4891 Unspecified atrial fibrillation: Secondary | ICD-10-CM | POA: Insufficient documentation

## 2020-03-13 DIAGNOSIS — F419 Anxiety disorder, unspecified: Secondary | ICD-10-CM | POA: Diagnosis present

## 2020-03-13 DIAGNOSIS — I10 Essential (primary) hypertension: Secondary | ICD-10-CM | POA: Diagnosis present

## 2020-03-13 LAB — CBC WITH DIFF
BASOPHIL #: <0.1 10*3/uL (ref <=0.20)
BASOPHIL %: 1 %
EOSINOPHIL #: <0.1 10*3/uL (ref <=0.50)
EOSINOPHIL %: 1 %
HCT: 29.9 % — ABNORMAL LOW (ref 34.8–46.0)
HGB: 9.8 g/dL — ABNORMAL LOW (ref 11.5–16.0)
IMMATURE GRANULOCYTE #: <0.1 10*3/uL (ref <0.10)
IMMATURE GRANULOCYTE %: 1 % (ref 0–1)
LYMPHOCYTE #: 0.67 10*3/uL — ABNORMAL LOW (ref 1.00–4.80)
LYMPHOCYTE %: 16 %
MCH: 30.4 pg (ref 26.0–32.0)
MCHC: 32.8 g/dL (ref 31.0–35.5)
MCV: 92.9 fL (ref 78.0–100.0)
MONOCYTE #: 0.33 10*3/uL (ref 0.20–1.10)
MONOCYTE %: 8 %
NEUTROPHIL #: 3.1 10*3/uL (ref 1.50–7.70)
NEUTROPHIL %: 73 %
PLATELETS: 116 10*3/uL — ABNORMAL LOW (ref 150–400)
RBC: 3.22 10*6/uL — ABNORMAL LOW (ref 3.85–5.22)
RDW-CV: 14.3 % (ref 11.5–15.5)
WBC: 4.2 10*3/uL (ref 3.7–11.0)

## 2020-03-13 LAB — URINALYSIS, MICROSCOPIC

## 2020-03-13 LAB — URINALYSIS, MACROSCOPIC
BILIRUBIN: NEGATIVE mg/dL
BLOOD: NEGATIVE mg/dL
GLUCOSE: NEGATIVE mg/dL
KETONES: NEGATIVE mg/dL
LEUKOCYTES: NEGATIVE WBCs/uL
NITRITE: NEGATIVE
PH: 6 (ref 5.0–8.5)
PROTEIN: 100 mg/dL — AB
SPECIFIC GRAVITY: 1.01 (ref 1.005–1.030)
UROBILINOGEN: 0.2 mg/dL

## 2020-03-13 LAB — LIPASE: LIPASE: <4 U/L — ABNORMAL LOW (ref 10–60)

## 2020-03-13 LAB — COMPREHENSIVE METABOLIC PANEL, NON-FASTING
ALBUMIN: 2.8 g/dL — ABNORMAL LOW (ref 3.4–4.8)
ALKALINE PHOSPHATASE: 160 U/L — ABNORMAL HIGH (ref 55–145)
ALT (SGPT): 23 U/L — ABNORMAL HIGH (ref 8–22)
ANION GAP: 9 mmol/L (ref 4–13)
AST (SGOT): 23 U/L (ref 8–45)
BILIRUBIN TOTAL: 0.3 mg/dL (ref 0.3–1.3)
BUN/CREA RATIO: 24 — ABNORMAL HIGH (ref 6–22)
BUN: 17 mg/dL (ref 8–25)
CALCIUM: 8 mg/dL — ABNORMAL LOW (ref 8.8–10.2)
CHLORIDE: 93 mmol/L — ABNORMAL LOW (ref 96–111)
CO2 TOTAL: 19 mmol/L — ABNORMAL LOW (ref 23–31)
CREATININE: 0.71 mg/dL (ref 0.60–1.05)
ESTIMATED GFR: 86 mL/min/BSA (ref >=60)
GLUCOSE: 59 mg/dL — ABNORMAL LOW (ref 65–125)
POTASSIUM: 3.7 mmol/L (ref 3.5–5.1)
PROTEIN TOTAL: 4.8 g/dL — ABNORMAL LOW (ref 6.0–8.0)
SODIUM: 121 mmol/L — ABNORMAL LOW (ref 136–145)

## 2020-03-13 LAB — POC BLOOD GLUCOSE (RESULTS)
GLUCOSE, POC: 63 mg/dl (ref 60–110)
GLUCOSE, POC: 95 mg/dl (ref 60–110)
GLUCOSE, POC: 216 mg/dl — ABNORMAL HIGH (ref 60–110)
GLUCOSE, POC: 214 mg/dl — ABNORMAL HIGH (ref 60–110)

## 2020-03-13 LAB — GRAY TOP TUBE

## 2020-03-13 LAB — BLUE TOP TUBE

## 2020-03-13 LAB — MAGNESIUM: MAGNESIUM: 1.7 mg/dL — ABNORMAL LOW (ref 1.8–2.6)

## 2020-03-13 MED ORDER — SODIUM CHLORIDE 1 GRAM TABLET
1.00 g | ORAL_TABLET | Freq: Three times a day (TID) | ORAL | Status: DC
Start: 2020-03-13 — End: 2020-03-16
  Administered 2020-03-13 – 2020-03-16 (×9): 1 g via ORAL
  Filled 2020-03-13 (×9): qty 1

## 2020-03-13 MED ORDER — HYDROCODONE 10 MG-ACETAMINOPHEN 325 MG TABLET
1.00 | ORAL_TABLET | ORAL | Status: DC | PRN
Start: 2020-03-13 — End: 2020-03-16

## 2020-03-13 MED ORDER — FUROSEMIDE 40 MG TABLET
80.00 mg | ORAL_TABLET | Freq: Every day | ORAL | Status: DC
Start: 2020-03-13 — End: 2020-03-16
  Administered 2020-03-13 – 2020-03-16 (×4): 80 mg via ORAL
  Filled 2020-03-13 (×4): qty 2

## 2020-03-13 MED ORDER — POLYETHYLENE GLYCOL 3350 17 GRAM ORAL POWDER PACKET
17.00 g | Freq: Every day | ORAL | Status: DC | PRN
Start: 2020-03-13 — End: 2020-03-16

## 2020-03-13 MED ORDER — LIPASE-PROTEASE-AMYLASE 5,000-17,000-27,000 UNIT CAPSULE,DELAYED REL
2.00 | DELAYED_RELEASE_CAPSULE | Freq: Three times a day (TID) | ORAL | Status: DC
Start: 2020-03-13 — End: 2020-03-13

## 2020-03-13 MED ORDER — ALUMINUM-MAG HYDROXIDE-SIMETHICONE 200 MG-200 MG-20 MG/5 ML ORAL SUSP
10.00 mL | ORAL | Status: DC | PRN
Start: 2020-03-13 — End: 2020-03-16

## 2020-03-13 MED ORDER — METHYLNALTREXONE 150 MG TABLET
450.00 mg | ORAL_TABLET | Freq: Every day | ORAL | Status: DC
Start: 2020-03-13 — End: 2020-03-16
  Administered 2020-03-13 – 2020-03-16 (×4): 0 mg via ORAL

## 2020-03-13 MED ORDER — LORAZEPAM 0.5 MG TABLET
0.50 mg | ORAL_TABLET | Freq: Every evening | ORAL | Status: DC
Start: 2020-03-13 — End: 2020-03-16
  Administered 2020-03-13 – 2020-03-15 (×3): 0.5 mg via ORAL
  Filled 2020-03-13 (×3): qty 1

## 2020-03-13 MED ORDER — SODIUM CHLORIDE 0.9 % IV BOLUS
1000.00 mL | INJECTION | Status: AC
Start: 2020-03-13 — End: 2020-03-13
  Administered 2020-03-13: 0 mL via INTRAVENOUS
  Administered 2020-03-13: 1000 mL via INTRAVENOUS

## 2020-03-13 MED ORDER — MAGNESIUM OXIDE 400 MG (241.3 MG MAGNESIUM) TABLET
400.00 mg | ORAL_TABLET | Freq: Two times a day (BID) | ORAL | Status: DC
Start: 2020-03-13 — End: 2020-03-16
  Administered 2020-03-13 – 2020-03-16 (×7): 400 mg via ORAL
  Filled 2020-03-13 (×7): qty 1

## 2020-03-13 MED ORDER — LIPASE-PROTEASE-AMYLASE 5,000-17,000-24,000 UNIT CAPSULE, DELAYED REL
2.00 | DELAYED_RELEASE_CAPSULE | Freq: Three times a day (TID) | ORAL | Status: DC
Start: 2020-03-13 — End: 2020-03-16
  Administered 2020-03-13: 0 via ORAL
  Administered 2020-03-13 – 2020-03-16 (×8): 2 via ORAL
  Filled 2020-03-13 (×8): qty 2

## 2020-03-13 MED ORDER — MAGNESIUM HYDROXIDE 400 MG/5 ML ORAL SUSPENSION
15.00 mL | Freq: Every day | ORAL | Status: DC | PRN
Start: 2020-03-13 — End: 2020-03-16

## 2020-03-13 MED ORDER — ENOXAPARIN 40 MG/0.4 ML SUBCUTANEOUS SYRINGE
40.00 mg | INJECTION | SUBCUTANEOUS | Status: DC
Start: 2020-03-13 — End: 2020-03-16
  Administered 2020-03-13 – 2020-03-15 (×3): 40 mg via SUBCUTANEOUS
  Filled 2020-03-13 (×3): qty 0.4

## 2020-03-13 MED ORDER — DEXTROSE 50 % IN WATER (D50W) INTRAVENOUS SYRINGE
12.50 g | INJECTION | INTRAVENOUS | Status: DC | PRN
Start: 2020-03-13 — End: 2020-03-16

## 2020-03-13 MED ORDER — SODIUM CHLORIDE 0.9 % (FLUSH) INJECTION SYRINGE
10.00 mL | INJECTION | Freq: Three times a day (TID) | INTRAMUSCULAR | Status: DC
Start: 2020-03-13 — End: 2020-03-16
  Administered 2020-03-13: 12:00:00 10 mL
  Administered 2020-03-13: 22:00:00 0 mL
  Administered 2020-03-14: 12:00:00 10 mL
  Administered 2020-03-14 – 2020-03-16 (×6): 0 mL

## 2020-03-13 MED ORDER — INSULIN LISPRO 100 UNIT/ML SUB-Q SSIP - RMH
0.00 [IU] | INJECTION | Freq: Four times a day (QID) | SUBCUTANEOUS | Status: DC
Start: 2020-03-13 — End: 2020-03-16
  Administered 2020-03-13 (×2): 6 [IU] via SUBCUTANEOUS
  Administered 2020-03-14: 07:00:00 0 [IU] via SUBCUTANEOUS
  Administered 2020-03-14: 3 [IU] via SUBCUTANEOUS
  Administered 2020-03-14: 0 [IU] via SUBCUTANEOUS
  Administered 2020-03-14: 21:00:00 6 [IU] via SUBCUTANEOUS
  Administered 2020-03-15: 23:00:00 3 [IU] via SUBCUTANEOUS
  Administered 2020-03-15: 0 [IU] via SUBCUTANEOUS
  Administered 2020-03-15: 18:00:00 6 [IU] via SUBCUTANEOUS
  Administered 2020-03-15 – 2020-03-16 (×2): 0 [IU] via SUBCUTANEOUS
  Filled 2020-03-13: qty 6
  Filled 2020-03-13 (×2): qty 3
  Filled 2020-03-13 (×3): qty 6

## 2020-03-13 MED ORDER — INSULIN GLARGINE 100 UNITS/ML SUBQ - CHARGE BY DOSE
10.00 [IU] | Freq: Every day | SUBCUTANEOUS | Status: DC
Start: 2020-03-13 — End: 2020-03-16
  Administered 2020-03-13: 13:00:00 0 [IU] via SUBCUTANEOUS
  Administered 2020-03-14 – 2020-03-16 (×3): 10 [IU] via SUBCUTANEOUS
  Filled 2020-03-13 (×3): qty 10

## 2020-03-13 MED ORDER — PANTOPRAZOLE 40 MG TABLET,DELAYED RELEASE
40.00 mg | DELAYED_RELEASE_TABLET | Freq: Every day | ORAL | Status: DC
Start: 2020-03-13 — End: 2020-03-16
  Administered 2020-03-13 – 2020-03-16 (×4): 40 mg via ORAL
  Filled 2020-03-13 (×4): qty 1

## 2020-03-13 MED ORDER — METOPROLOL SUCCINATE ER 50 MG TABLET,EXTENDED RELEASE 24 HR
50.00 mg | ORAL_TABLET | Freq: Every day | ORAL | Status: DC
Start: 2020-03-13 — End: 2020-03-16
  Administered 2020-03-13 – 2020-03-16 (×4): 50 mg via ORAL
  Filled 2020-03-13 (×4): qty 1

## 2020-03-13 MED ORDER — SODIUM CHLORIDE 0.9 % INTRAVENOUS SOLUTION
INTRAVENOUS | Status: DC
Start: 2020-03-13 — End: 2020-03-16

## 2020-03-13 MED ORDER — TIOTROPIUM BROMIDE 2.5 MCG/ACTUATION MIST FOR INHALATION
2.00 | Freq: Every day | RESPIRATORY_TRACT | Status: DC
Start: 2020-03-13 — End: 2020-03-16
  Administered 2020-03-13 – 2020-03-16 (×4): 2 via RESPIRATORY_TRACT
  Filled 2020-03-13: qty 4

## 2020-03-13 MED ORDER — ONDANSETRON HCL (PF) 4 MG/2 ML INJECTION SOLUTION
4.00 mg | INTRAMUSCULAR | Status: DC | PRN
Start: 2020-03-13 — End: 2020-03-16

## 2020-03-13 MED ORDER — LOSARTAN 50 MG TABLET
100.00 mg | ORAL_TABLET | Freq: Every day | ORAL | Status: DC
Start: 2020-03-13 — End: 2020-03-16
  Administered 2020-03-13 – 2020-03-16 (×4): 100 mg via ORAL
  Filled 2020-03-13 (×4): qty 2

## 2020-03-13 MED ORDER — DILTIAZEM 30 MG TABLET
30.00 mg | ORAL_TABLET | Freq: Four times a day (QID) | ORAL | Status: DC
Start: 2020-03-13 — End: 2020-03-16
  Administered 2020-03-13 – 2020-03-16 (×12): 30 mg via ORAL
  Filled 2020-03-13 (×12): qty 1

## 2020-03-13 MED ORDER — SODIUM CHLORIDE 0.9 % (FLUSH) INJECTION SYRINGE
10.00 mL | INJECTION | INTRAMUSCULAR | Status: DC | PRN
Start: 2020-03-13 — End: 2020-03-16

## 2020-03-13 MED ORDER — LORATADINE 10 MG TABLET
10.00 mg | ORAL_TABLET | Freq: Every day | ORAL | Status: DC
Start: 2020-03-13 — End: 2020-03-16
  Administered 2020-03-13 – 2020-03-16 (×4): 10 mg via ORAL
  Filled 2020-03-13 (×4): qty 1

## 2020-03-13 MED ORDER — HYDRALAZINE 25 MG TABLET
25.00 mg | ORAL_TABLET | Freq: Two times a day (BID) | ORAL | Status: DC
Start: 2020-03-13 — End: 2020-03-16
  Administered 2020-03-13: 13:00:00 0 mg via ORAL
  Administered 2020-03-13 – 2020-03-16 (×6): 25 mg via ORAL
  Filled 2020-03-13 (×6): qty 1

## 2020-03-13 MED ORDER — ATORVASTATIN 10 MG TABLET
10.00 mg | ORAL_TABLET | Freq: Every evening | ORAL | Status: DC
Start: 2020-03-13 — End: 2020-03-16
  Administered 2020-03-13 – 2020-03-15 (×3): 10 mg via ORAL
  Filled 2020-03-13 (×3): qty 1

## 2020-03-13 MED ORDER — ACETAMINOPHEN 325 MG TABLET
650.00 mg | ORAL_TABLET | ORAL | Status: DC | PRN
Start: 2020-03-13 — End: 2020-03-16

## 2020-03-13 MED ORDER — ASPIRIN 81 MG TABLET,DELAYED RELEASE
81.00 mg | DELAYED_RELEASE_TABLET | Freq: Every day | ORAL | Status: DC
Start: 2020-03-13 — End: 2020-03-16
  Administered 2020-03-13 – 2020-03-16 (×4): 81 mg via ORAL
  Filled 2020-03-13 (×4): qty 1

## 2020-03-13 NOTE — Respiratory Therapy (Signed)
Patient was started on her home med Spiriva this shift. Patient had not taken morning meds. See MAR and flowsheet for medicine documentation. No acute respiratory distress noted this shift.

## 2020-03-13 NOTE — ED Triage Notes (Addendum)
Generalized weakness, home health [facility] has been unable to draw labs peripherally x2 weeks. [facility] had not sent RN into home to try to labs via [place].

## 2020-03-13 NOTE — Nurses Notes (Signed)
Blood sugar recheck per finger stick is 95. Patient is enjoying her lunch. Harl Bowie RN at bedside for admission history.

## 2020-03-13 NOTE — ED Nurses Note (Signed)
[name] son updated by phone.

## 2020-03-13 NOTE — H&P (Addendum)
[facility]  Hospitalist  History & Physical    Date of Service:  03/13/2020  [name], [name], 74 y.o. female  Date of Admission:  03/13/2020  Date of Birth:  1945-12-15  PCP: Myrene Buddy, MD    Chief Complaint:  Weakness.    HPI:    [name] is a 74 y.o. White female who is admitted for and generalized muscle weakness.  Patient presented to the emergency room via EMS with complaints of weakness.  The patient has a history of pancreatic cancer status post [place].  Patient has had increasing generalized weakness over the past 2 weeks.  She reports she has had a decreased p.o. intake denies any fever chills.  She denies any cough or shortness of breath.  No COVID exposures.  No nausea vomiting or diarrhea.  Workup in emergency room showed hyponatremia with a sodium of 121. The patient will be admitted to the hospital service for monitoring, IV fluids, and further workup of her hyponatremia.    Past Medical History:   Diagnosis Date    Abdominal hernia 10/18/2017    hx of repair    Anxiety     Arthritis     Asthma     Atrial fibrillation (CMS HCC)     Back problem     Bruises easily     Cancer (CMS [place]) 10/18/2017    chemo and radiation completed 09/24/2017    COPD (chronic obstructive pulmonary disease) (CMS HCC)     CPAP (continuous positive airway pressure) dependence 10/18/2017    has not used recently    Depression     DM (diabetes mellitus) (CMS [place]) 2000    Fasting BG 200's. HGA1C 2018 6    Edema     Essential hypertension     GERD (gastroesophageal reflux disease)     Headache     Heartburn     Hx antineoplastic chemo 2018    Hx of radiation therapy 2018    Hypercholesteremia     Hyperlipidemia     Migraine 10/18/2017    none recently    Obesity     Palpitations     Pancreatic cancer (CMS [place]) 03/2017    Panic attack     PE (pulmonary thromboembolism) (CMS HCC) 03/29/2018    Peripheral neuropathy 10/18/2017    knees down, bilateral    Sepsis (CMS [place])  04/15/2019    Sleep apnea     Type 2 diabetes mellitus (CMS [place]) 10/18/2017    Dx 2000 FBS 200s    Unintentional weight loss 10/18/2017    25 lbs 03/2017    UTI (urinary tract infection)     Wears glasses       Past Surgical History:   Procedure Laterality Date    CESAREAN SECTION      COLONOSCOPY  10/04/2019    Diverticuli a colonoscopy by Dr. Tonye Becket    HX CESAREAN SECTION      HX CHOLECYSTECTOMY      HX HERNIA REPAIR      HX SUBCLAVIAN PORT IMPLANTION      s/p removed    HX SUBCLAVIAN PORT IMPLANTION      HX TONSIL AND ADENOIDECTOMY      HX TONSILLECTOMY      PANCREATICODUODENECTOMY  99991111    UMBILICAL HERNIA REPAIR      x2    WHIPPLE PROCEDURE W/ LAPAROSCOPY        Social History     Tobacco  Use    Smoking status: Former Smoker     Packs/day: 0.50     Years: 20.00     Pack years: 10.00     Types: Cigarettes    Smokeless tobacco: Never Used    Tobacco comment: unsure of quit date   Vaping Use    Vaping Use: Never used   Substance Use Topics    Alcohol use: Never    Drug use: Never       Family Medical History:     Problem Relation (Age of Onset)    Alzheimer's/Dementia Father (57)    Cancer Sister    Diabetes Mother    Heart Attack Mother (34)    Heart Disease Mother, Brother    Hypertension (High Blood Pressure) Other    Lung disease Father    Other Mother (46)    Pacemaker Sister    Respiratory Problems Father         Medications Prior to Admission     Prescriptions    Acetaminophen-Butalbital 50-325 mg Oral Tablet    Take by mouth    aspirin (ECOTRIN) 81 mg Oral Tablet, Delayed Release (E.C.)    Take 1 Tab (81 mg total) by mouth Once a day    atorvastatin (LIPITOR) 10 mg Oral Tablet    TAKE 1 TABLET BY MOUTH ONCE DAILY    cetirizine (ZYRTEC) 10 mg Oral Tablet    Take 1 Tab (10 mg total) by mouth Once a day    dilTIAZem (CARDIZEM) 30 mg Oral Tablet    Take 1 Tablet (30 mg total) by mouth Four times a day for 30 days    furosemide (LASIX) 80 mg Oral Tablet    Take 1 Tablet (80 mg total) by  mouth Once a day for 30 days    hydrALAZINE (APRESOLINE) 50 mg Oral Tablet    Take 0.5 Tablets (25 mg total) by mouth Twice daily for 30 days    HYDROcodone-acetaminophen (NORCO) 7.5-325 mg Oral Tablet    TAKE 1 TABLET BY MOUTH EVERY 6 HOURS AS NEEDED FOR PAIN    insulin aspart U-100 (NOVOLOG FLEXPEN U-100 INSULIN) 100 unit/mL (3 mL) Subcutaneous Insulin Pen    7 Units by Subcutaneous route Twice a day before meals    Patient taking differently:  by Subcutaneous route Twice a day before meals Per sliding scale    insulin glargine (LANTUS SOLOSTAR U-100 INSULIN) 100 unit/mL Subcutaneous Insulin Pen    10 Units by Subcutaneous route Every night    lidocaine HCL (XYLOCAINE) 2 % Mucous Membrane Solution    5 mL    LORazepam (ATIVAN) 0.5 mg Oral Tablet    Take 1 Tablet (0.5 mg total) by mouth Every evening    losartan (COZAAR) 100 mg Oral Tablet    Take 1 Tablet (100 mg total) by mouth Once a day    magnesium oxide (MAG-OX) 400 mg Oral Tablet    Take 1 Tablet (400 mg total) by mouth Twice daily for 30 days    methylnaltrexone (RELISTOR) 150 mg Oral Tablet    Relistor 150 mg tablet  TAKE 3 TABLETS BY MOUTH DAILY    metoprolol succinate (TOPROL-XL) 50 mg Oral Tablet Sustained Release 24 hr    Take 1 Tablet (50 mg total) by mouth Once a day    NANO PEN NEEDLE 32 gauge x 5/32" Needle    USE WITH INSULIN FLEXPEN BID AS NEEDED    omeprazole (PRILOSEC) 40 mg Oral Capsule, Delayed Release(E.C.)  Take 1 Cap (40 mg total) by mouth Once a day    pancreatic enzyme replacement (CREON) 12,000-38,000 -60,000 unit Oral Capsule, Delayed Release(E.C.)    Take 2 Capsules by mouth Three times daily with meals 2 CAPS WITH MEALS AND 1 CAP WITH EACH SNACK    sodium chloride 1 gram Oral Tablet    Take 1 Tablet (1 g total) by mouth Three times daily with meals for 30 days    tiotropium bromide (SPIRIVA HANDIHALER) 18 mcg Inhalation Capsule, w/Inhalation Device    Take 1 Capsule (18 mcg total) by inhalation Once a day for 180 days            Allergies   Allergen Reactions    Sulfa (Sulfonamides) Itching        Review of Systems   Constitutional: Positive for malaise/fatigue. Negative for chills and fever.   HENT: Negative for congestion, ear pain, sore throat and tinnitus.    Eyes: Negative for blurred vision, photophobia and pain.   Respiratory: Negative for cough, hemoptysis, sputum production, shortness of breath and wheezing.    Cardiovascular: Negative for chest pain, palpitations, orthopnea, claudication, leg swelling and PND.   Gastrointestinal: Positive for nausea. Negative for abdominal pain, diarrhea, heartburn and vomiting.   Genitourinary: Negative for dysuria, frequency and urgency.   Musculoskeletal: Negative for back pain, joint pain and myalgias.   Skin: Negative for rash.   Neurological: Negative for dizziness, sensory change, speech change, focal weakness, weakness and headaches.   Endo/Heme/Allergies: Negative for environmental allergies. Does not bruise/bleed easily.   Psychiatric/Behavioral: Negative for depression and substance abuse. The patient is not nervous/anxious.           Filed Vitals:    03/13/20 1145 03/13/20 1210 03/13/20 1401 03/13/20 1532   BP: (!) 179/76 (!) 157/71  (!) 140/70   Pulse:  71  82   Resp:  16  20   Temp:  36.4 C (97.5 F)  36.4 C (97.5 F)   SpO2: 100%  99%        Physical Exam  Constitutional:       General: She is awake. She is not in acute distress.     Appearance: Normal appearance. She is normal weight. She is not ill-appearing.   HENT:      Head: Normocephalic and atraumatic.   Eyes:      Conjunctiva/sclera: Conjunctivae normal.      Pupils: Pupils are equal, round, and reactive to light.   Cardiovascular:      Rate and Rhythm: Normal rate and regular rhythm.      Heart sounds: Murmur heard.   Systolic murmur is present with a grade of 3/6.   No friction rub. No gallop.    Pulmonary:      Effort: No respiratory distress.      Breath sounds: Normal breath sounds. No wheezing or rales.    Abdominal:      General: Bowel sounds are normal. There is no distension.      Palpations: Abdomen is soft. There is no mass.      Tenderness: There is no abdominal tenderness. There is no guarding or rebound.   Musculoskeletal:         General: No deformity. Normal range of motion.      Cervical back: Normal range of motion and neck supple.   Skin:     General: Skin is warm and dry.      Findings: No erythema or rash.  Neurological:      Mental Status: She is alert and oriented to person, place, and time.      Cranial Nerves: No cranial nerve deficit.   Psychiatric:         Mood and Affect: Affect normal.         Cognition and Memory: Memory normal.         Laboratory Data:     Results for orders placed or performed during the hospital encounter of 03/13/20 (from the past 24 hour(s))   COMPREHENSIVE METABOLIC PANEL, NON-FASTING   Result Value Ref Range    SODIUM 121 (L) 136 - 145 mmol/L    POTASSIUM 3.7 3.5 - 5.1 mmol/L    CHLORIDE 93 (L) 96 - 111 mmol/L    CO2 TOTAL 19 (L) 23 - 31 mmol/L    ANION GAP 9 4 - 13 mmol/L    BUN 17 8 - 25 mg/dL    CREATININE 0.71 0.60 - 1.05 mg/dL    BUN/CREA RATIO 24 (H) 6 - 22    ESTIMATED GFR 86 >=60 mL/min/BSA    ALBUMIN 2.8 (L) 3.4 - 4.8 g/dL     CALCIUM 8.0 (L) 8.8 - 10.2 mg/dL    GLUCOSE 59 (L) 65 - 125 mg/dL    ALKALINE PHOSPHATASE 160 (H) 55 - 145 U/L    ALT (SGPT) 23 (H) 8 - 22 U/L    AST (SGOT)  23 8 - 45 U/L    BILIRUBIN TOTAL 0.3 0.3 - 1.3 mg/dL    PROTEIN TOTAL 4.8 (L) 6.0 - 8.0 g/dL   LIPASE   Result Value Ref Range    LIPASE <4 (L) 10 - 60 U/L   MAGNESIUM   Result Value Ref Range    MAGNESIUM 1.7 (L) 1.8 - 2.6 mg/dL   CBC WITH DIFF   Result Value Ref Range    WBC 4.2 3.7 - 11.0 x103/uL    RBC 3.22 (L) 3.85 - 5.22 x106/uL    HGB 9.8 (L) 11.5 - 16.0 g/dL    HCT 29.9 (L) 34.8 - 46.0 %    MCV 92.9 78.0 - 100.0 fL    MCH 30.4 26.0 - 32.0 pg    MCHC 32.8 31.0 - 35.5 g/dL    RDW-CV 14.3 11.5 - 15.5 %    PLATELETS 116 (L) 150 - 400 x103/uL    NEUTROPHIL % 73 %     LYMPHOCYTE % 16 %    MONOCYTE % 8 %    EOSINOPHIL % 1 %    BASOPHIL % 1 %    NEUTROPHIL # 3.10 1.50 - 7.70 x103/uL    LYMPHOCYTE # 0.67 (L) 1.00 - 4.80 x103/uL    MONOCYTE # 0.33 0.20 - 1.10 x103/uL    EOSINOPHIL # <0.10 <=0.50 x103/uL    BASOPHIL # <0.10 <=0.20 x103/uL    IMMATURE GRANULOCYTE % 1 0 - 1 %    IMMATURE GRANULOCYTE # <0.10 <0.10 x103/uL   BLUE TOP TUBE   Result Value Ref Range    RAINBOW/EXTRA TUBE AUTO RESULT Yes    GRAY TOP TUBE   Result Value Ref Range    RAINBOW/EXTRA TUBE AUTO RESULT Yes    URINALYSIS, MACROSCOPIC   Result Value Ref Range    COLOR Yellow Yellow, Straw    APPEARANCE Clear Clear, Slightly Hazy    SPECIFIC GRAVITY 1.010 1.005 - 1.030    PH 6.0 5.0 - 8.5    LEUKOCYTES Negative Negative WBCs/uL  NITRITE Negative Negative    PROTEIN 100  (A) Negative mg/dL    GLUCOSE Negative Negative mg/dL    KETONES Negative Negative mg/dL    UROBILINOGEN 0.2  0.2 mg/dL    BILIRUBIN Negative Negative mg/dL    BLOOD Negative Negative mg/dL   URINALYSIS, MICROSCOPIC   Result Value Ref Range    RBCS Occasional None, Occasional, 0-2, 3-5 /hpf    WBCS 3-5 None, Occasional, 0-2, 3-5 /hpf    BACTERIA Occasional (A) None /hpf    SQUAMOUS EPITHELIAL 6-10 (A) None, Occasional, 0-2, 3-5 /hpf    RENAL EPITHELIAL 6-10 (A) None, Occasional, 0-2, 3-5 /hpf   POC BLOOD GLUCOSE (RESULTS)   Result Value Ref Range    GLUCOSE, POC 63 60 - 110 mg/dl   POC BLOOD GLUCOSE (RESULTS)   Result Value Ref Range    GLUCOSE, POC 95 60 - 110 mg/dl   POC BLOOD GLUCOSE (RESULTS)   Result Value Ref Range    GLUCOSE, POC 216 (H) 60 - 110 mg/dl       Imaging Studies:    XR ABD FLAT AND UPRIGHT SERIES (W PA CHEST)   ED Interpretation by Tawanna Sat, DO (04/02 [id number])   Chest film negative for acute change.  Port-A-Cath noted in the right.  Flat and upright abdomen with nonspecific gas, no free air or air-fluid levels.      Final Result by Edi, [facility] In (04/02 1017)   Chronic chest changes. No acute cardiac or pulmonary or  pleural   disease.      Port-A-Cath in place.      No free air underneath the hemidiaphragms.      No significant pathology demonstrated with conventional radiography in the   abdomen.      This note was partially created using voice recognition software and is   inherently subject to errors including those of syntax and "sound alike "   substitutions which may escape proof reading.  In such instances, original   meaning may be extrapolated by contextual derivation.         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  Not appear to be associated with medications at this time.  Possibly due to pancreatic cancer however decreased p.o. intake could be a possibility.  Will start patient on normal saline at 100 mL/hour.  Will repeat lab work in the morning.  Will continue to follow.   Cirrhosis and ascites.  Chronic and stable.   CHF.  Will monitor for fluid overload.  Continue medications.   COPD.  Continue medications.   Central hypertension.  Continue Cozaar and hydralazine.   Hyperlipidemia.  Lipitor.   History of hypokalemia.  Will monitor electrolytes.   DVT prophylaxis.  Lovenox.    Bethanie Dicker, MD

## 2020-03-13 NOTE — ED Provider Notes (Signed)
[place]  03-30-1946  74 y.o.  female  [place] [address]   [phone] (home)    Chief Complaint:   Chief Complaint   Patient presents with    Weakness       HPI: This is a 74 y.o. female who presents to the emergency department by EMS for weakness.  Patient has pancreatic cancer, and has previously had Whipple procedure.  She has had generalized weakness, and home health has been unable to draw labs peripheral week for 2 weeks.  Patient does state possible weight loss, and does admit to decreased intake, and increasing generalized weakness.  She denies any COVID exposures, and denies fevers, chills, cough, dyspnea, smell or taste deficits, headache, or chest pains.  No GI complaints.    COVID screening negative.    Past Medical History:   Past Medical History:   Diagnosis Date    Abdominal hernia 10/18/2017    hx of repair    Anxiety     Arthritis     Asthma     Atrial fibrillation (CMS HCC)     Back problem     Bruises easily     Cancer (CMS [place]) 10/18/2017    chemo and radiation completed 09/24/2017    COPD (chronic obstructive pulmonary disease) (CMS HCC)     CPAP (continuous positive airway pressure) dependence 10/18/2017    has not used recently    Depression     DM (diabetes mellitus) (CMS [place]) 2000    Fasting BG 200's. HGA1C 2018 6    Edema     Essential hypertension     GERD (gastroesophageal reflux disease)     Headache     Heartburn     Hx antineoplastic chemo 2018    Hx of radiation therapy 2018    Hypercholesteremia     Hyperlipidemia     Migraine 10/18/2017    none recently    Obesity     Palpitations     Pancreatic cancer (CMS [place]) 03/2017    Panic attack     PE (pulmonary thromboembolism) (CMS HCC) 03/29/2018    Peripheral neuropathy 10/18/2017    knees down, bilateral    Sepsis (CMS [place]) 04/15/2019    Sleep apnea     Type 2 diabetes mellitus (CMS [place]) 10/18/2017    Dx 2000 FBS 200s    Unintentional weight loss 10/18/2017    25 lbs 03/2017    UTI (urinary tract  infection)     Wears glasses      (Not in an outpatient encounter)     Past Surgical History:   Past Surgical History:   Procedure Laterality Date    Cesarean section      Colonoscopy  10/04/2019    Hx cesarean section      Hx cholecystectomy      Hx hernia repair      Hx subclavian port implantion      Hx subclavian port implantion      Hx tonsil and adenoidectomy      Hx tonsillectomy      Pancreaticoduodenectomy  [id number]    Umbilical hernia repair      Whipple procedure w/ laparoscopy         Social History:   Social History     Tobacco Use    Smoking status: Former Smoker     Packs/day: 0.50     Years: 20.00     Pack years: 10.00     Types: Cigarettes  Smokeless tobacco: Never Used    Tobacco comment: unsure of quit date   Vaping Use    Vaping Use: Never used   Substance Use Topics    Alcohol use: Never    Drug use: Never        Family History:  Family History   Problem Relation Age of Onset    Heart Disease Mother     Diabetes Mother     Heart Attack Mother 14    Other Mother 48        Bypass surgery    Respiratory Problems Father     Alzheimer's/Dementia Father 64    Lung disease Father         Black lung, lung removal    Cancer Sister     Pacemaker Sister     Heart Disease Brother     Hypertension (High Blood Pressure) Other      Prior to Admission medications    Medication Sig Start Date End Date Taking? Authorizing Provider   Acetaminophen-Butalbital 50-325 mg Oral Tablet Take by mouth    Provider, Historical   aspirin (ECOTRIN) 81 mg Oral Tablet, Delayed Release (E.C.) Take 1 Tab (81 mg total) by mouth Once a day 04/20/19   Hyacinth Meeker, MD   atorvastatin (LIPITOR) 10 mg Oral Tablet TAKE 1 TABLET BY MOUTH ONCE DAILY 01/13/20   Ignacia Felling, MD   cetirizine (ZYRTEC) 10 mg Oral Tablet Take 1 Tab (10 mg total) by mouth Once a day 04/20/19   Hyacinth Meeker, MD   dilTIAZem (CARDIZEM) 30 mg Oral Tablet Take 1 Tablet (30 mg total) by mouth Four times a day for 30  days 03/02/20 04/01/20  Myrene Buddy, MD   furosemide (LASIX) 80 mg Oral Tablet Take 1 Tablet (80 mg total) by mouth Once a day for 30 days 02/25/20 03/26/20  Myrene Buddy, MD   hydrALAZINE (APRESOLINE) 50 mg Oral Tablet Take 0.5 Tablets (25 mg total) by mouth Twice daily for 30 days 02/11/20 03/12/20  Myrene Buddy, MD   HYDROcodone-acetaminophen (NORCO) 7.5-325 mg Oral Tablet TAKE 1 TABLET BY MOUTH EVERY 6 HOURS AS NEEDED FOR PAIN 02/19/20   Provider, Historical   insulin aspart U-100 (NOVOLOG FLEXPEN U-100 INSULIN) 100 unit/mL (3 mL) Subcutaneous Insulin Pen 7 Units by Subcutaneous route Twice a day before meals  Patient taking differently: by Subcutaneous route Twice a day before meals Per sliding scale 01/22/20   Charlie Pitter, MD   insulin glargine (LANTUS SOLOSTAR U-100 INSULIN) 100 unit/mL Subcutaneous Insulin Pen 10 Units by Subcutaneous route Every night 11/25/19   Myrene Buddy, MD   lidocaine HCL (XYLOCAINE) 2 % Mucous Membrane Solution 5 mL 05/02/17   Provider, Historical   LORazepam (ATIVAN) 0.5 mg Oral Tablet Take 1 Tablet (0.5 mg total) by mouth Every evening 03/02/20   Myrene Buddy, MD   losartan (COZAAR) 100 mg Oral Tablet Take 1 Tablet (100 mg total) by mouth Once a day 02/07/20   Bethanie Dicker, MD   magnesium oxide (MAG-OX) 400 mg Oral Tablet Take 1 Tablet (400 mg total) by mouth Twice daily for 30 days 03/02/20 04/01/20  Myrene Buddy, MD   methylnaltrexone (RELISTOR) 150 mg Oral Tablet Relistor 150 mg tablet  TAKE 3 TABLETS BY MOUTH DAILY 03/02/20   Myrene Buddy, MD   metoprolol succinate (TOPROL-XL) 50 mg Oral Tablet Sustained Release 24 hr Take 1 Tablet (50 mg total) by mouth Once a day 02/07/20  Bethanie Dicker, MD   NANO PEN NEEDLE 32 gauge x 5/32" Needle USE WITH INSULIN FLEXPEN BID AS NEEDED 08/06/19   Provider, Historical   omeprazole (PRILOSEC) 40 mg Oral Capsule, Delayed Release(E.C.) Take 1 Cap (40 mg total) by mouth Once a day 02/07/18   Nicola Police, MD   pancreatic enzyme  replacement (CREON) 12,000-38,000 -60,000 unit Oral Capsule, Delayed Release(E.C.) Take 2 Capsules by mouth Three times daily with meals 2 CAPS WITH MEALS AND 1 CAP WITH EACH [facility] 02/17/20   Myrene Buddy, MD   sodium chloride 1 gram Oral Tablet Take 1 Tablet (1 g total) by mouth Three times daily with meals for 30 days 03/02/20 04/01/20  Myrene Buddy, MD   tiotropium bromide (SPIRIVA HANDIHALER) 18 mcg Inhalation Capsule, w/Inhalation Device Take 1 Capsule (18 mcg total) by inhalation Once a day for 180 days 02/24/20 08/22/20  Myrene Buddy, MD   spironolactone (ALDACTONE) 25 mg Oral Tablet Take 1 Tablet (25 mg total) by mouth Once a day for 180 days 02/11/20 02/27/20  Myrene Buddy, MD          Allergies:   Allergies   Allergen Reactions    Sulfa (Sulfonamides) Itching       Above history reviewed with patient.  Allergies, medication list, reviewed.       Review of systems  Constitutional: Negative for fever. Negative for chills.  Positive for fatigue and weakness, decreased intake reported.  Skin: Negative for rash or other lesions.   HENT: Negative for headaches.  Denies pharyngitis, smell or taste deficits.  Eyes: Negative for blurred vision and double vision.   Cardiovascular: Negative for chest pain and palpitations.   Respiratory: Negative for cough. Is not experiencing shortness of breath. No wheezing.  Gastrointestinal: Negative for nausea, vomiting and abdominal pain. No diarrhea, constipation or hematochezia.   Genitourinary: Negative for dysuria, urgency, frequency, or hematuria.  Slightly diminished urine output reported.  Musculoskeletal: Negative for neck pain and back pain.  Bilateral lower extremity edema, the patient states is improving.  Neurological: Negative for dizziness and loss of consciousness.  No numbness, tingling, or other sensation changed.  Reports generalized weakness without focal deficits.  Psych:  Denies anxiety or depression.  All other systems reviewed and are negative.             Physical Exam  ED Triage Vitals [03/13/20 0828]   BP (Non-Invasive) (!) 150/92   Heart Rate 64   Respiratory Rate 18   Temperature 35.5 C (95.9 F)   SpO2 100 %   Weight 75.8 kg (167 lb)   Height 1.575 m (5\' 2" )     Constitutional: patient is oriented to person, place, and time and well-developed, well-nourished, and in no distress.  Patient is frail, however she is nontoxic in appearance.  HENT:   Head: Normocephalic and atraumatic.   Mouth/Throat: Oropharynx is clear and slightly dry membranes.  Eyes: Pupils are equal, round, and reactive to light. Conjunctivae and EOM are normal.   Neck: Normal range of motion. Neck supple.  No adenopathy.  Cardiovascular: Normal rate, regular rhythm, normal heart sounds and intact distal pulses.  No murmurs.  Port-A-Cath noted in right chest wall.  Pulmonary/Chest: Effort normal and breath sounds normal.  No rales, rhonchi, or wheezes.  Abdominal: Soft. Bowel sounds are normal.  Nontender.  No masses.  Scars noted from previous Whipple procedure.  Musculoskeletal: Normal range of motion.  Two to 3+ bilateral lower extremity edema.  Nontender calves.  Neurological: Patient is alert and oriented to person, place, and time.  Cranial nerves 2-12 grossly intact.  No focal deficits.  GCS score is 15.   Skin: Skin is warm and dry.   Psychiatric: Mood, memory, affect and judgment normal.    The following orders were placed after examining the patient :  Orders Placed This Encounter    XR ABD FLAT AND UPRIGHT SERIES (W PA CHEST)    CBC/DIFF    COMPREHENSIVE METABOLIC PANEL, NON-FASTING    LIPASE    MAGNESIUM    URINALYSIS, MACROSCOPIC AND MICROSCOPIC    CBC WITH DIFF    URINALYSIS, MACROSCOPIC    URINALYSIS, MICROSCOPIC    EXTRA TUBES    BLUE TOP TUBE    GRAY TOP TUBE    COVID-19 SCREENING - Asymptomatic - NON-PUI    ECG 12-LEAD    NS bolus infusion 1,000 mL      XR ABD FLAT AND UPRIGHT SERIES (W PA CHEST)   ED Interpretation   Chest film negative for acute change.   Port-A-Cath noted in the right.  Flat and upright abdomen with nonspecific gas, no free air or air-fluid levels.      Final Result   Chronic chest changes. No acute cardiac or pulmonary or pleural   disease.      Port-A-Cath in place.      No free air underneath the hemidiaphragms.      No significant pathology demonstrated with conventional radiography in the   abdomen.      This note was partially created using voice recognition software and is   inherently subject to errors including those of syntax and "sound alike "   substitutions which may escape proof reading.  In such instances, original   meaning may be extrapolated by contextual derivation.         Radiologist location ID: [id number]            Labs Reviewed   COMPREHENSIVE METABOLIC PANEL, NON-FASTING - Abnormal; Notable for the following components:       Result Value    SODIUM 121 (*)     CHLORIDE 93 (*)     CO2 TOTAL 19 (*)     BUN/CREA RATIO 24 (*)     ALBUMIN 2.8 (*)     CALCIUM 8.0 (*)     GLUCOSE 59 (*)     ALKALINE PHOSPHATASE 160 (*)     ALT (SGPT) 23 (*)     PROTEIN TOTAL 4.8 (*)     All other components within normal limits   LIPASE - Abnormal; Notable for the following components:    LIPASE <4 (*)     All other components within normal limits   MAGNESIUM - Abnormal; Notable for the following components:    MAGNESIUM 1.7 (*)     All other components within normal limits   CBC WITH DIFF - Abnormal; Notable for the following components:    RBC 3.22 (*)     HGB 9.8 (*)     HCT 29.9 (*)     PLATELETS 116 (*)     LYMPHOCYTE # 0.67 (*)     All other components within normal limits   URINALYSIS, MACROSCOPIC - Abnormal; Notable for the following components:    PROTEIN 100  (*)     All other components within normal limits   URINALYSIS, MICROSCOPIC - Abnormal; Notable for the following components:    BACTERIA Occasional (*)  SQUAMOUS EPITHELIAL 6-10 (*)     RENAL EPITHELIAL 6-10 (*)     All other components within normal limits   CBC/DIFF    Narrative:      The following orders were created for panel order CBC/DIFF.  Procedure                               Abnormality         Status                     ---------                               -----------         ------                     CBC WITH [id number]                Abnormal            Final result                 Please view results for these tests on the individual orders.   URINALYSIS, MACROSCOPIC AND MICROSCOPIC    Narrative:     The following orders were created for panel order URINALYSIS, MACROSCOPIC AND MICROSCOPIC.  Procedure                               Abnormality         Status                     ---------                               -----------         ------                     URINALYSIS, MACROSCOPIC[[id number]]      Abnormal            Final result               URINALYSIS, MICROSCOPIC[[id number]]      Abnormal            Final result                 Please view results for these tests on the individual orders.   EXTRA TUBES    Narrative:     The following orders were created for panel order EXTRA TUBES.  Procedure                               Abnormality         Status                     ---------                               -----------         ------  BLUE TOP [id number]                                    Final result               GRAY TOP [id number]                                    Final result                 Please view results for these tests on the individual orders.   BLUE TOP TUBE   GRAY TOP TUBE   COVID-19 [place] MOLECULAR LAB TESTING          ED Course:     Frail 74 year old white female patient brought in the ER for lab check, and evaluation.  Patient has pancreatic cancer, and home health has been unable to draw labs.  Patient was rate frail appearing, and after history and physical examination, laboratory data, chest radiograph and flat and upright abdomen ordered.    Flat and upright abdominal series reveals no obvious acute process.   Lab    Laboratory data:  CBC reveals white count 4.2 hemoglobin 9.8 hematocrit 29.9 platelet count 116000, and this is stable.  Magnesium 1.7.  Lipase less than 4.  Metabolic reveals sodium [id number] potassium 3.7 chloride 93 CO2 19 BUN 17 creatinine 0.71 calcium 8.0 glucose 59.  Sodium has been noted in the 124-126 range over the last few weeks.    Patient did receive normal saline 1 L bolus, and at this time, with review of labs, send out COVID test was ordered.  Patient will require admission.  Hospitalist notified.    Comments added by Tawanna Sat, DO on 03/13/20 at 11:19.           Medications   NS bolus infusion 1,000 mL (0 mL Intravenous Stopped 03/13/20 0941)      Procedures      Emergency Department Procedure:    EKG Interpertation:     EKG is normal sinus rhythm 98 beats per minute.  Axis 5.  T-wave inversions in leads fever, F, and V1 through V 3, with RSR prime noted.  Incomplete right bundle-branch block.  Prolonged QRS duration of 0.118 seconds.  No acute ST elevations or reciprocal depressions noted.    February 27, 2020 EKG:  Sinus rhythm sinus arrhythmia noted and first-degree AV block.  Axis-6 degrees.  P.r. interval prolonged at 0.240 seconds, otherwise normal intervals.  No acute ST elevations or reciprocal depressions.  There is 1 on baseline artifact noted however.    Findings and diagnosis discussed with patient.    Clinical Impression:   Encounter Diagnoses   Name Primary?    Generalized weakness Yes    Hyponatremia        Critical Care Time:  Total critical care time spent in direct care of this patient at high risk based on presenting history/exam/and complaint, including initial evaluation and stabilization, review of data, re-examination, discussion with admitting and consulting services to arrange definitive care, and exclusive of any procedures performed, was 30 minutes.   Patient admitted for further treatment     Disposition: Admitted    No follow-ups on file.   New Prescriptions    No  medications on file      No follow-up provider  specified.   BP 131/73    Pulse 73    Temp 35.5 C (95.9 F)    Resp 16    Ht 1.575 m (5\' 2" )    Wt 75.8 kg (167 lb)    SpO2 99%    BMI 30.54 kg/m        Tawanna Sat, DO       This note was partially created using voice recognition software and is inherently subject to errors including those of syntax and "sound alike " substitutions which may escape proof reading.  In such instances, original meaning may be extrapolated by contextual derivation.

## 2020-03-13 NOTE — ED Nurses Note (Signed)
Patient report given to [facility]

## 2020-03-13 NOTE — Nurses Notes (Signed)
Patient arrived via bed from ER for hyponatremia and weakness. She is awake and alert though slow moving/speaking. Oriented x4. She is a somewhat poor historian. Noted shivering and she states "I'm freezing." 4 blankets in place, room warmed, and temp is WNL. Explained orders and plan of care. Tele applied NSR with PVCs 70's. IV fluids started to accessed mediport at right chest.

## 2020-03-14 LAB — POC BLOOD GLUCOSE (RESULTS)
GLUCOSE, POC: 103 mg/dl (ref 60–110)
GLUCOSE, POC: 172 mg/dl — ABNORMAL HIGH (ref 60–110)
GLUCOSE, POC: 124 mg/dl — ABNORMAL HIGH (ref 60–110)
GLUCOSE, POC: 210 mg/dl — ABNORMAL HIGH (ref 60–110)

## 2020-03-14 LAB — ECG 12-LEAD
Atrial Rate: 73 {beats}/min
Calculated P Axis: 46 degrees
Calculated R Axis: -25 degrees
Calculated T Axis: 48 degrees
PR Interval: 210 ms
QRS Duration: 92 ms
QT Interval: 438 ms
QTC Calculation: 482 ms
Ventricular rate: 73 {beats}/min

## 2020-03-14 LAB — COMPREHENSIVE METABOLIC PANEL, NON-FASTING
ALBUMIN: 2.6 g/dL — ABNORMAL LOW (ref 3.4–4.8)
ALKALINE PHOSPHATASE: 142 U/L (ref 55–145)
ALT (SGPT): 20 U/L (ref 8–22)
ANION GAP: 9 mmol/L (ref 4–13)
AST (SGOT): 21 U/L (ref 8–45)
BILIRUBIN TOTAL: 0.4 mg/dL (ref 0.3–1.3)
BUN/CREA RATIO: 16 (ref 6–22)
BUN: 15 mg/dL (ref 8–25)
CALCIUM: 8 mg/dL — ABNORMAL LOW (ref 8.8–10.2)
CHLORIDE: 95 mmol/L — ABNORMAL LOW (ref 96–111)
CO2 TOTAL: 20 mmol/L — ABNORMAL LOW (ref 23–31)
CREATININE: 0.92 mg/dL (ref 0.60–1.05)
ESTIMATED GFR: 63 mL/min/BSA (ref >=60)
GLUCOSE: 105 mg/dL (ref 65–125)
POTASSIUM: 4.1 mmol/L (ref 3.5–5.1)
PROTEIN TOTAL: 4.8 g/dL — ABNORMAL LOW (ref 6.0–8.0)
SODIUM: 124 mmol/L — ABNORMAL LOW (ref 136–145)

## 2020-03-14 LAB — CBC WITH DIFF
BASOPHIL #: <0.1 10*3/uL (ref <=0.20)
BASOPHIL %: 1 %
EOSINOPHIL #: <0.1 10*3/uL (ref <=0.50)
EOSINOPHIL %: 1 %
HCT: 29.8 % — ABNORMAL LOW (ref 34.8–46.0)
HGB: 9.7 g/dL — ABNORMAL LOW (ref 11.5–16.0)
IMMATURE GRANULOCYTE #: <0.1 10*3/uL (ref <0.10)
IMMATURE GRANULOCYTE %: 1 % (ref 0–1)
LYMPHOCYTE #: 1.44 10*3/uL (ref 1.00–4.80)
LYMPHOCYTE %: 25 %
MCH: 30.6 pg (ref 26.0–32.0)
MCHC: 32.6 g/dL (ref 31.0–35.5)
MCV: 94 fL (ref 78.0–100.0)
MONOCYTE #: 0.42 10*3/uL (ref 0.20–1.10)
MONOCYTE %: 7 %
MPV: 11.3 fL (ref 8.7–12.5)
NEUTROPHIL #: 3.7 10*3/uL (ref 1.50–7.70)
NEUTROPHIL %: 65 %
PLATELETS: 165 10*3/uL (ref 150–400)
RBC: 3.17 10*6/uL — ABNORMAL LOW (ref 3.85–5.22)
RDW-CV: 14.6 % (ref 11.5–15.5)
WBC: 5.7 10*3/uL (ref 3.7–11.0)

## 2020-03-14 LAB — COVID-19 ~~LOC~~ MOLECULAR LAB TESTING: 2019-nCoV/SARS-CoV-2: NOT DETECTED

## 2020-03-14 NOTE — Nurses Notes (Signed)
Pt in bed with extra blankets, pt states she is cold, temp normal WDL. Denies any needs atm.

## 2020-03-14 NOTE — Respiratory Therapy (Signed)
Patient doing well. Does not require supplemental oxygen. No distress noted. Will continue to monitor.

## 2020-03-14 NOTE — Care Plan (Signed)
Problem: Adult Inpatient Plan of Care  Goal: Absence of Hospital-Acquired Illness or Injury  Intervention: Identify and Manage Fall Risk  Flowsheets (Taken 03/14/2020 0701)  Safety Promotion/Fall Prevention:   activity supervised   fall prevention program maintained   nonskid shoes/slippers when out of bed   safety round/check completed  Intervention: Prevent Skin Injury  Flowsheets (Taken 03/13/2020 2000)  Body Position: high-fowlers (greater than 60 degrees)  Intervention: Prevent and Manage VTE (Venous Thromboembolism) Risk  Flowsheets (Taken 03/14/2020 0701)  VTE Prevention/Management: ambulation promoted  Intervention: Prevent Infection  Flowsheets (Taken 03/14/2020 0701)  Infection Prevention:   promote handwashing   rest/sleep promoted   visitors restricted/screened   single patient room provided

## 2020-03-14 NOTE — Progress Notes (Signed)
[facility]  Hospitalist  Progress Note    Date of Service:  03/14/2020  [name], [name], 74 y.o. female  Date of Admission:  03/13/2020  Date of Birth:  January 28, 1946  PCP: Myrene Buddy, MD    HPI:    Patient admitted to the hospital for generalized weakness and hyponatremia.  The patient reports that she is feeling a bit better today.  She has no new complaints.  She is tolerating p.o..  Sodium is improved to 124. No new complaints.    acetaminophen (TYLENOL) tablet, 650 mg, Oral, Q4H PRN  aluminum-magnesium hydroxide-simethicone (MAG-AL PLUS) 200-200-20 mg per 5 mL oral liquid, 10 mL, Oral, Q4H PRN  aspirin (ECOTRIN) enteric coated tablet 81 mg, 81 mg, Oral, Daily  atorvastatin (LIPITOR) tablet, 10 mg, Oral, QPM  dextrose 50% (0.5 g/mL) injection - syringe, 12.5 g, Intravenous, Q15 Min PRN  dilTIAZem (CARDIZEM) tablet, 30 mg, Oral, 4x/day  enoxaparin PF (LOVENOX) 40 mg/0.4 mL SubQ injection, 40 mg, Subcutaneous, Q24H  furosemide (LASIX) tablet, 80 mg, Oral, Daily  hydrALAZINE (APRESOLINE) tablet, 25 mg, Oral, 2x/day  HYDROcodone-acetaminophen (NORCO) 10-325 mg per tablet, 1 Tablet, Oral, Q4H PRN  insulin glargine 100 units/mL injection, 10 Units, Subcutaneous, Daily  loratadine (CLARITIN) tablet, 10 mg, Oral, Daily  LORazepam (ATIVAN) tablet, 0.5 mg, Oral, QPM  losartan (COZAAR) tablet, 100 mg, Oral, Daily  magnesium hydroxide (MILK OF MAGNESIA) 400mg  per 68mL oral liquid, 15 mL, Oral, Daily PRN  magnesium oxide (MAG-OX) tablet, 400 mg, Oral, 2x/day  methylnaltrexone Tablet 450 mg, 450 mg, Oral, Daily  metoprolol succinate (TOPROL-XL) 24 hr extended release tablet, 50 mg, Oral, Daily  NS flush syringe, 10 mL, Intracatheter, Q8HRS  NS flush syringe, 10 mL, Intracatheter, Q1H PRN  NS premix infusion, , Intravenous, Continuous  ondansetron (ZOFRAN) 2 mg/mL injection, 4 mg, Intravenous, Q4H PRN  pancreatic enzyme replacement 5,000 units lipase per cap, 2 Capsule, Oral, 3x/day-Meals  pantoprazole  (PROTONIX) delayed release tablet, 40 mg, Oral, Daily  polyethylene glycol (MIRALAX) oral packet, 17 g, Oral, Daily PRN  sodium chloride tablet, 1 g, Oral, 3x/day-Meals  SSIP insulin lispro (HUMALOG) 100 units/mL injection, 0-12 Units, Subcutaneous, 4x/day AC  tiotropium bromide (SPIRIVA RESPIMAT) 2.5 mcg per inhalation oral inhaler - "Respiratory to administer", 2 Puff, Inhalation, Daily         Allergies   Allergen Reactions    Sulfa (Sulfonamides) Itching             Filed Vitals:    03/14/20 0420 03/14/20 0722 03/14/20 0735 03/14/20 1105   BP: (!) 110/59 (!) 153/50  (!) 144/49   Pulse: 70 67  67   Resp: 16 16  16    Temp: 37.1 C (98.8 F) 36.5 C (97.7 F)  36.7 C (98 F)   SpO2:   92%        Physical Exam  Constitutional:       General: She is awake. She is not in acute distress.     Appearance: Normal appearance. She is normal weight. She is not ill-appearing.   HENT:      Head: Normocephalic and atraumatic.   Eyes:      Conjunctiva/sclera: Conjunctivae normal.      Pupils: Pupils are equal, round, and reactive to light.   Cardiovascular:      Rate and Rhythm: Normal rate and regular rhythm.      Heart sounds: Murmur heard.   Systolic murmur is present with a grade of 3/6.   No  friction rub. No gallop.    Pulmonary:      Effort: No respiratory distress.      Breath sounds: Normal breath sounds. No wheezing or rales.   Abdominal:      General: Bowel sounds are normal. There is no distension.      Palpations: Abdomen is soft. There is no mass.      Tenderness: There is no abdominal tenderness. There is no guarding or rebound.   Musculoskeletal:         General: No deformity. Normal range of motion.      Cervical back: Normal range of motion and neck supple.   Skin:     General: Skin is warm and dry.      Findings: No erythema or rash.   Neurological:      Mental Status: She is alert and oriented to person, place, and time.      Cranial Nerves: No cranial nerve deficit.   Psychiatric:         Mood and Affect:  Affect normal.         Cognition and Memory: Memory normal.         Laboratory Data:     Results for orders placed or performed during the hospital encounter of 03/13/20 (from the past 24 hour(s))   POC BLOOD GLUCOSE (RESULTS)   Result Value Ref Range    GLUCOSE, POC 95 60 - 110 mg/dl   POC BLOOD GLUCOSE (RESULTS)   Result Value Ref Range    GLUCOSE, POC 216 (H) 60 - 110 mg/dl   POC BLOOD GLUCOSE (RESULTS)   Result Value Ref Range    GLUCOSE, POC 214 (H) 60 - 110 mg/dl   COMPREHENSIVE METABOLIC PANEL, NON-FASTING   Result Value Ref Range    SODIUM 124 (L) 136 - 145 mmol/L    POTASSIUM 4.1 3.5 - 5.1 mmol/L    CHLORIDE 95 (L) 96 - 111 mmol/L    CO2 TOTAL 20 (L) 23 - 31 mmol/L    ANION GAP 9 4 - 13 mmol/L    BUN 15 8 - 25 mg/dL    CREATININE 0.92 0.60 - 1.05 mg/dL    BUN/CREA RATIO 16 6 - 22    ESTIMATED GFR 63 >=60 mL/min/BSA    ALBUMIN 2.6 (L) 3.4 - 4.8 g/dL     CALCIUM 8.0 (L) 8.8 - 10.2 mg/dL    GLUCOSE 105 65 - 125 mg/dL    ALKALINE PHOSPHATASE 142 55 - 145 U/L    ALT (SGPT) 20 8 - 22 U/L    AST (SGOT)  21 8 - 45 U/L    BILIRUBIN TOTAL 0.4 0.3 - 1.3 mg/dL    PROTEIN TOTAL 4.8 (L) 6.0 - 8.0 g/dL   CBC WITH DIFF   Result Value Ref Range    WBC 5.7 3.7 - 11.0 x103/uL    RBC 3.17 (L) 3.85 - 5.22 x106/uL    HGB 9.7 (L) 11.5 - 16.0 g/dL    HCT 29.8 (L) 34.8 - 46.0 %    MCV 94.0 78.0 - 100.0 fL    MCH 30.6 26.0 - 32.0 pg    MCHC 32.6 31.0 - 35.5 g/dL    RDW-CV 14.6 11.5 - 15.5 %    PLATELETS 165 150 - 400 x103/uL    MPV 11.3 8.7 - 12.5 fL    NEUTROPHIL % 65 %    LYMPHOCYTE % 25 %    MONOCYTE % 7 %  EOSINOPHIL % 1 %    BASOPHIL % 1 %    NEUTROPHIL # 3.70 1.50 - 7.70 x103/uL    LYMPHOCYTE # 1.44 1.00 - 4.80 x103/uL    MONOCYTE # 0.42 0.20 - 1.10 x103/uL    EOSINOPHIL # <0.10 <=0.50 x103/uL    BASOPHIL # <0.10 <=0.20 x103/uL    IMMATURE GRANULOCYTE % 1 0 - 1 %    IMMATURE GRANULOCYTE # <0.10 <0.10 x103/uL   POC BLOOD GLUCOSE (RESULTS)   Result Value Ref Range    GLUCOSE, POC 103 60 - 110 mg/dl   POC BLOOD GLUCOSE  (RESULTS)   Result Value Ref Range    GLUCOSE, POC 172 (H) 60 - 110 mg/dl       Imaging Studies:    XR ABD FLAT AND UPRIGHT SERIES (W PA CHEST)   ED Interpretation by Tawanna Sat, DO (04/02 [id number])   Chest film negative for acute change.  Port-A-Cath noted in the right.  Flat and upright abdomen with nonspecific gas, no free air or air-fluid levels.      Final Result by Edi, Radresults In (04/02 1017)   Chronic chest changes. No acute cardiac or pulmonary or pleural   disease.      Port-A-Cath in place.      No free air underneath the hemidiaphragms.      No significant pathology demonstrated with conventional radiography in the   abdomen.      This note was partially created using voice recognition software and is   inherently subject to errors including those of syntax and "sound alike "   substitutions which may escape proof reading.  In such instances, original   meaning may be extrapolated by contextual derivation.         Radiologist location ID: [id number]             Assessment/Plan:  Hospital Problems    1Hyponatremia         Date Noted: 10/24/2018      Cirrhosis of liver with ascites (CMS [place])         Date Noted: 02/11/2020      Longstanding persistent atrial fibrillation (CMS [facility])         Date Noted: 02/11/2020      CHF (congestive heart failure) (CMS [facility])         Date Noted: 04/13/2019      COPD (chronic obstructive pulmonary disease) (CMS [facility])         Date Noted: 04/13/2019      Essential hypertension         Date Noted: 04/13/2019      Hyperlipidemia         Date Noted: 04/13/2019      Hypokalemia         Date Noted: 11/23/2017      Resolved Hospital Problems  No resolved problems to display.       Hyponatremia.  Possibly due to dehydration.  The patient is responding well to normal saline.  Will continue normal saline at 100 mL/hour.  Will repeat lab work in the morning.   Cirrhosis and ascites.  Chronic and stable.   CHF.  Monitor for fluid overload.   COPD.  Continue home  medications.   Hypertension.  Continue Cozaar and hydralazine.   Hyperlipidemia.  Lipitor.   DVT prophylaxis.  Lovenox.    Bethanie Dicker, MD

## 2020-03-14 NOTE — Care Plan (Signed)
Problem: Fall Injury Risk  Goal: Absence of Fall and Fall-Related Injury  Outcome: Ongoing (see interventions/notes)     Patient will remain free of falls during this shift.  Goal met.  Dalia Heading, RN  03/14/2020, 18:41

## 2020-03-14 NOTE — Nurses Notes (Signed)
Assisted patient back to bed from bedside commode, without difficulty. Patients right chest medi-port accessed, however dressing was found to be off at this time. Right chest medi-port  dressing changed:  Site appearance:, pink, no bleeding or edema noted, needle in place.    Site cleansed with alcohol, and allowed to dry.  Covered with occlusive dressing to secure.   Sterile technique used.  Patient tolerated well. Will continue to monitor. Call light within reach. Dalia Heading, RN  03/14/2020, 09:00

## 2020-03-15 LAB — CBC WITH DIFF
BASOPHIL #: <0.1 10*3/uL (ref <=0.20)
BASOPHIL %: 2 %
EOSINOPHIL #: <0.1 10*3/uL (ref <=0.50)
EOSINOPHIL %: 2 %
HCT: 27.2 % — ABNORMAL LOW (ref 34.8–46.0)
HGB: 8.7 g/dL — ABNORMAL LOW (ref 11.5–16.0)
IMMATURE GRANULOCYTE #: <0.1 10*3/uL (ref <0.10)
IMMATURE GRANULOCYTE %: 1 % (ref 0–1)
LYMPHOCYTE #: 1.33 10*3/uL (ref 1.00–4.80)
LYMPHOCYTE %: 33 %
MCH: 30.4 pg (ref 26.0–32.0)
MCHC: 32 g/dL (ref 31.0–35.5)
MCV: 95.1 fL (ref 78.0–100.0)
MONOCYTE #: 0.48 10*3/uL (ref 0.20–1.10)
MONOCYTE %: 12 %
NEUTROPHIL #: 2.01 10*3/uL (ref 1.50–7.70)
NEUTROPHIL %: 50 %
PLATELETS: 110 10*3/uL — ABNORMAL LOW (ref 150–400)
RBC: 2.86 10*6/uL — ABNORMAL LOW (ref 3.85–5.22)
RDW-CV: 14.6 % (ref 11.5–15.5)
WBC: 4 10*3/uL (ref 3.7–11.0)

## 2020-03-15 LAB — BASIC METABOLIC PANEL
ANION GAP: 5 mmol/L (ref 4–13)
BUN/CREA RATIO: 17 (ref 6–22)
BUN: 14 mg/dL (ref 8–25)
CALCIUM: 7.9 mg/dL — ABNORMAL LOW (ref 8.8–10.2)
CHLORIDE: 99 mmol/L (ref 96–111)
CO2 TOTAL: 22 mmol/L — ABNORMAL LOW (ref 23–31)
CREATININE: 0.84 mg/dL (ref 0.60–1.05)
ESTIMATED GFR: 70 mL/min/BSA (ref >=60)
GLUCOSE: 100 mg/dL (ref 65–125)
POTASSIUM: 4 mmol/L (ref 3.5–5.1)
SODIUM: 126 mmol/L — ABNORMAL LOW (ref 136–145)

## 2020-03-15 LAB — POC BLOOD GLUCOSE (RESULTS)
GLUCOSE, POC: 204 mg/dl — ABNORMAL HIGH (ref 60–110)
GLUCOSE, POC: 238 mg/dl — ABNORMAL HIGH (ref 60–110)
GLUCOSE, POC: 185 mg/dl — ABNORMAL HIGH (ref 60–110)

## 2020-03-15 MED ORDER — SODIUM CHLORIDE 1 GRAM TABLET
1.00 g | ORAL_TABLET | Freq: Three times a day (TID) | ORAL | Status: DC
Start: 2020-03-15 — End: 2020-03-15

## 2020-03-15 NOTE — Progress Notes (Signed)
[facility]  Hospitalist  Progress Note    Date of Service:  03/15/2020  [name], [name], 74 y.o. female  Date of Admission:  03/13/2020  Date of Birth:  1946-07-20  PCP: Myrene Buddy, MD    HPI:    Patient admitted to the hospital with generalized weakness and hyponatremia.  Patient reports that she is feeling a bit better today but still feels rough.  She is tolerating p.o..  Sodium slowly is improving.  Today her sodium is 126. No new complaints.      acetaminophen (TYLENOL) tablet, 650 mg, Oral, Q4H PRN  aluminum-magnesium hydroxide-simethicone (MAG-AL PLUS) 200-200-20 mg per 5 mL oral liquid, 10 mL, Oral, Q4H PRN  aspirin (ECOTRIN) enteric coated tablet 81 mg, 81 mg, Oral, Daily  atorvastatin (LIPITOR) tablet, 10 mg, Oral, QPM  dextrose 50% (0.5 g/mL) injection - syringe, 12.5 g, Intravenous, Q15 Min PRN  dilTIAZem (CARDIZEM) tablet, 30 mg, Oral, 4x/day  enoxaparin PF (LOVENOX) 40 mg/0.4 mL SubQ injection, 40 mg, Subcutaneous, Q24H  furosemide (LASIX) tablet, 80 mg, Oral, Daily  hydrALAZINE (APRESOLINE) tablet, 25 mg, Oral, 2x/day  HYDROcodone-acetaminophen (NORCO) 10-325 mg per tablet, 1 Tablet, Oral, Q4H PRN  insulin glargine 100 units/mL injection, 10 Units, Subcutaneous, Daily  loratadine (CLARITIN) tablet, 10 mg, Oral, Daily  LORazepam (ATIVAN) tablet, 0.5 mg, Oral, QPM  losartan (COZAAR) tablet, 100 mg, Oral, Daily  magnesium hydroxide (MILK OF MAGNESIA) 400mg  per 12mL oral liquid, 15 mL, Oral, Daily PRN  magnesium oxide (MAG-OX) tablet, 400 mg, Oral, 2x/day  methylnaltrexone Tablet 450 mg, 450 mg, Oral, Daily  metoprolol succinate (TOPROL-XL) 24 hr extended release tablet, 50 mg, Oral, Daily  NS flush syringe, 10 mL, Intracatheter, Q8HRS  NS flush syringe, 10 mL, Intracatheter, Q1H PRN  NS premix infusion, , Intravenous, Continuous  ondansetron (ZOFRAN) 2 mg/mL injection, 4 mg, Intravenous, Q4H PRN  pancreatic enzyme replacement 5,000 units lipase per cap, 2 Capsule, Oral,  3x/day-Meals  pantoprazole (PROTONIX) delayed release tablet, 40 mg, Oral, Daily  polyethylene glycol (MIRALAX) oral packet, 17 g, Oral, Daily PRN  sodium chloride tablet, 1 g, Oral, 3x/day-Meals  sodium chloride tablet, 1 g, Oral, 3x/day-Meals  SSIP insulin lispro (HUMALOG) 100 units/mL injection, 0-12 Units, Subcutaneous, 4x/day AC  tiotropium bromide (SPIRIVA RESPIMAT) 2.5 mcg per inhalation oral inhaler - "Respiratory to administer", 2 Puff, Inhalation, Daily         Allergies   Allergen Reactions    Sulfa (Sulfonamides) Itching             Filed Vitals:    03/14/20 1930 03/14/20 1937 03/14/20 2335 03/15/20 0400   BP:  (!) 144/52 (!) 139/55 (!) 141/50   Pulse:  64 68 60   Resp:  16 16 16    Temp:  36.9 C (98.5 F) 36.8 C (98.3 F) 36.9 C (98.4 F)   SpO2: 97%          Physical Exam  Constitutional:       General: She is awake. She is not in acute distress.     Appearance: Normal appearance. She is normal weight. She is not ill-appearing.   HENT:      Head: Normocephalic and atraumatic.   Eyes:      Conjunctiva/sclera: Conjunctivae normal.      Pupils: Pupils are equal, round, and reactive to light.   Cardiovascular:      Rate and Rhythm: Normal rate and regular rhythm.      Heart sounds: Murmur heard.  Systolic murmur is present with a grade of 3/6.   No friction rub. No gallop.    Pulmonary:      Effort: No respiratory distress.      Breath sounds: Normal breath sounds. No wheezing or rales.   Abdominal:      General: Bowel sounds are normal. There is no distension.      Palpations: Abdomen is soft. There is no mass.      Tenderness: There is no abdominal tenderness. There is no guarding or rebound.   Musculoskeletal:         General: No deformity. Normal range of motion.      Cervical back: Normal range of motion and neck supple.   Skin:     General: Skin is warm and dry.      Findings: No erythema or rash.   Neurological:      Mental Status: She is alert and oriented to person, place, and time.       Cranial Nerves: No cranial nerve deficit.   Psychiatric:         Mood and Affect: Affect normal.         Cognition and Memory: Memory normal.         Laboratory Data:     Results for orders placed or performed during the hospital encounter of 03/13/20 (from the past 24 hour(s))   POC BLOOD GLUCOSE (RESULTS)   Result Value Ref Range    GLUCOSE, POC 172 (H) 60 - 110 mg/dl   POC BLOOD GLUCOSE (RESULTS)   Result Value Ref Range    GLUCOSE, POC 124 (H) 60 - 110 mg/dl   POC BLOOD GLUCOSE (RESULTS)   Result Value Ref Range    GLUCOSE, POC 210 (H) 60 - 110 mg/dl   BASIC METABOLIC PANEL   Result Value Ref Range    SODIUM 126 (L) 136 - 145 mmol/L    POTASSIUM 4.0 3.5 - 5.1 mmol/L    CHLORIDE 99 96 - 111 mmol/L    CO2 TOTAL 22 (L) 23 - 31 mmol/L    ANION GAP 5 4 - 13 mmol/L    CALCIUM 7.9 (L) 8.8 - 10.2 mg/dL    GLUCOSE 100 65 - 125 mg/dL    BUN 14 8 - 25 mg/dL    CREATININE 0.84 0.60 - 1.05 mg/dL    BUN/CREA RATIO 17 6 - 22    ESTIMATED GFR 70 >=60 mL/min/BSA   CBC WITH DIFF   Result Value Ref Range    WBC 4.0 3.7 - 11.0 x103/uL    RBC 2.86 (L) 3.85 - 5.22 x106/uL    HGB 8.7 (L) 11.5 - 16.0 g/dL    HCT 27.2 (L) 34.8 - 46.0 %    MCV 95.1 78.0 - 100.0 fL    MCH 30.4 26.0 - 32.0 pg    MCHC 32.0 31.0 - 35.5 g/dL    RDW-CV 14.6 11.5 - 15.5 %    PLATELETS 110 (L) 150 - 400 x103/uL    NEUTROPHIL % 50 %    LYMPHOCYTE % 33 %    MONOCYTE % 12 %    EOSINOPHIL % 2 %    BASOPHIL % 2 %    NEUTROPHIL # 2.01 1.50 - 7.70 x103/uL    LYMPHOCYTE # 1.33 1.00 - 4.80 x103/uL    MONOCYTE # 0.48 0.20 - 1.10 x103/uL    EOSINOPHIL # <0.10 <=0.50 x103/uL    BASOPHIL # <0.10 <=0.20 x103/uL  IMMATURE GRANULOCYTE % 1 0 - 1 %    IMMATURE GRANULOCYTE # <0.10 <0.10 x103/uL       Imaging Studies:    XR ABD FLAT AND UPRIGHT SERIES (W PA CHEST)   ED Interpretation by Tawanna Sat, DO (04/02 [id number])   Chest film negative for acute change.  Port-A-Cath noted in the right.  Flat and upright abdomen with nonspecific gas, no free air or air-fluid levels.        Final Result by Edi, Radresults In (04/02 1017)   Chronic chest changes. No acute cardiac or pulmonary or pleural   disease.      Port-A-Cath in place.      No free air underneath the hemidiaphragms.      No significant pathology demonstrated with conventional radiography in the   abdomen.      This note was partially created using voice recognition software and is   inherently subject to errors including those of syntax and "sound alike "   substitutions which may escape proof reading.  In such instances, original   meaning may be extrapolated by contextual derivation.         Radiologist location ID: [id number]             Assessment/Plan:  Hospital Problems    1Hyponatremia         Date Noted: 10/24/2018      Cirrhosis of liver with ascites (CMS [place])         Date Noted: 02/11/2020      Longstanding persistent atrial fibrillation (CMS [facility])         Date Noted: 02/11/2020      CHF (congestive heart failure) (CMS [facility])         Date Noted: 04/13/2019      COPD (chronic obstructive pulmonary disease) (CMS [facility])         Date Noted: 04/13/2019      Essential hypertension         Date Noted: 04/13/2019      Hyperlipidemia         Date Noted: 04/13/2019      Hypokalemia         Date Noted: 11/23/2017      Resolved Hospital Problems  No resolved problems to display.       Hyponatremia.  Improving slowly.  Will add sodium tablets 1 g p.o. t.i.d..  Continue normal saline IV.  Repeat lab work in the morning.   Cirrhosis and ascites.  Chronic and stable.   CHF.  Stable.  Monitor for fluid overload.   COPD.  Continue medications.   Hypertension.  Continue Cozaar and hydralazine.   Hyperlipidemia.  Lipitor.   DVT prophylaxis.  Lovenox.      Bethanie Dicker, MD

## 2020-03-15 NOTE — Care Plan (Signed)
Problem: Skin Injury Risk Increased  Goal: Skin Health and Integrity  Intervention: Optimize Skin Protection  Flowsheets (Taken 03/15/2020 0535)  Pressure Reduction Techniques: frequent weight shift encouraged  Head of Bed (HOB) Positioning: HOB lowered

## 2020-03-15 NOTE — Care Plan (Signed)
Problem: Adult Inpatient Plan of Care  Goal: Plan of Care Review  Outcome: Ongoing (see interventions/notes)  Goal: Patient-Specific Goal (Individualized)  Outcome: Ongoing (see interventions/notes)  Goal: Absence of Hospital-Acquired Illness or Injury  Outcome: Ongoing (see interventions/notes)  Goal: Optimal Comfort and Wellbeing  Outcome: Ongoing (see interventions/notes)  Goal: Rounds/Family Conference  Outcome: Ongoing (see interventions/notes)     Problem: Electrolyte Imbalance  Goal: Electrolyte Balance  Outcome: Ongoing (see interventions/notes)     Problem: Muscle Strength Impairment  Goal: Improved Muscle Strength  Outcome: Ongoing (see interventions/notes)     Problem: Fall Injury Risk  Goal: Absence of Fall and Fall-Related Injury  Outcome: Ongoing (see interventions/notes)     Problem: Skin Injury Risk Increased  Goal: Skin Health and Integrity  Outcome: Ongoing (see interventions/notes)

## 2020-03-16 LAB — CBC WITH DIFF
BASOPHIL #: <0.1 10*3/uL (ref <=0.20)
BASOPHIL %: 1 %
EOSINOPHIL #: <0.1 10*3/uL (ref <=0.50)
EOSINOPHIL %: 2 %
HCT: 24.5 % — ABNORMAL LOW (ref 34.8–46.0)
HGB: 8 g/dL — ABNORMAL LOW (ref 11.5–16.0)
IMMATURE GRANULOCYTE #: <0.1 10*3/uL (ref <0.10)
IMMATURE GRANULOCYTE %: 0 % (ref 0–1)
LYMPHOCYTE #: 1.29 10*3/uL (ref 1.00–4.80)
LYMPHOCYTE %: 35 %
MCH: 30.9 pg (ref 26.0–32.0)
MCHC: 32.7 g/dL (ref 31.0–35.5)
MCV: 94.6 fL (ref 78.0–100.0)
MONOCYTE #: 0.46 10*3/uL (ref 0.20–1.10)
MONOCYTE %: 12 %
MPV: 11.7 fL (ref 8.7–12.5)
NEUTROPHIL #: 1.84 10*3/uL (ref 1.50–7.70)
NEUTROPHIL %: 50 %
PLATELETS: 114 10*3/uL — ABNORMAL LOW (ref 150–400)
RBC: 2.59 10*6/uL — ABNORMAL LOW (ref 3.85–5.22)
RDW-CV: 14.6 % (ref 11.5–15.5)
WBC: 3.7 10*3/uL (ref 3.7–11.0)

## 2020-03-16 LAB — BASIC METABOLIC PANEL
ANION GAP: 8 mmol/L (ref 4–13)
BUN/CREA RATIO: 17 (ref 6–22)
BUN: 13 mg/dL (ref 8–25)
CALCIUM: 7.8 mg/dL — ABNORMAL LOW (ref 8.8–10.2)
CHLORIDE: 104 mmol/L (ref 96–111)
CO2 TOTAL: 16 mmol/L — ABNORMAL LOW (ref 23–31)
CREATININE: 0.78 mg/dL (ref 0.60–1.05)
ESTIMATED GFR: 76 mL/min/BSA (ref >=60)
GLUCOSE: 63 mg/dL — ABNORMAL LOW (ref 65–125)
POTASSIUM: 3.9 mmol/L (ref 3.5–5.1)
SODIUM: 128 mmol/L — ABNORMAL LOW (ref 136–145)

## 2020-03-16 MED ORDER — HEPARIN, PORCINE (PF) 100 UNIT/ML INTRAVENOUS SYRINGE
5.0000 mL | INJECTION | Freq: Once | INTRAVENOUS | Status: DC
Start: 2020-03-16 — End: 2020-03-16
  Filled 2020-03-16: qty 5

## 2020-03-16 NOTE — Care Management Notes (Signed)
New order received to resume home health services.  Patient established with [facility].  ROC orders sent at this time.

## 2020-03-16 NOTE — Care Management Notes (Signed)
03/16/20 1534   Assessment Details   Assessment Type Admission   Date of Care Management Update 03/16/20   Readmission   Is this a readmission? No   Medicare Intent to Discharge Documentation   Admit IMM given to: Patient   Admit IMM letter given date 03/15/20   Admit IMM letter time given 1417   IMM explained/reviewed with:  Patient   Care Management Plan   Discharge Planning Status initial meeting   Projected Discharge Date 03/16/20   Discharge plan discussed with: Patient   CM will evaluate for rehabilitation potential no   Facility or Agency Preferences [facility] has [facility] and prefers to return home with [facility]   Discharge Needs Assessment   Outpatient/Agency/Support Group Needs homecare agency   Was referral sent to APS? No   Equipment Currently Used at Home walker, rolling;cane, straight   Equipment Needed After Discharge none   Discharge Facility/Level of Care Needs Home with Home Health (code 6)   Transportation Available car;family or friend will provide   Referral Information   Admission Type inpatient   Address Verified verified-no changes   Arrived From home healthcare service   [place] verified-no change   ADVANCE DIRECTIVES   Does the Patient have an Advance Directive? Yes, Patient Does Have Advance Directive for Healthcare Treatment   Type of Advance Directive Completed Medical Power of Attorney   Copy of Advance Directives in Chart? 6   Name of MPOA or Healthcare Surrogate sister Alysia Penna   Phone Number of [place] or Healthcare Surrogate [phone]   Patient Requests Assistance in Having Advance Directive Notarized. N/A   LAY CAREGIVER    Appointed Lay Caregiver? I Decline   Employment/Financial   Patient has Prescription Coverage?  Yes        Name of Insurance Coverage for Medications [employer]   Financial Concerns none   Living Environment   Select an age group to open "lives with" row.  Adult   Lives With  child(ren), adult   Living Arrangements house   Able to Return to Prior Arrangements yes   [place] Accessibility no concerns;bed and bath on same level;stairs to enter home   Legal Issues   Do you have a court appointed guardian/conservator? No   Patient Hand-Off   Clinical/Discharge Plan of Care Information Communicated to:  Clinical Care Coordinator   Home Main Entrance   Number of Stairs, Main Entrance six

## 2020-03-16 NOTE — Nurses Notes (Signed)
Stopped IVFs and flushed with heparin. Waiting for son to discharge home. Jamse Mead, RN  03/16/2020, 14:17

## 2020-03-16 NOTE — Care Plan (Signed)
Problem: Adult Inpatient Plan of Care  Goal: Absence of Hospital-Acquired Illness or Injury  Intervention: Identify and Manage Fall Risk  Flowsheets (Taken 03/16/2020 0000)  Safety Promotion/Fall Prevention:   activity supervised   fall prevention program maintained   nonskid shoes/slippers when out of bed   safety round/check completed  Intervention: Prevent Skin Injury  Flowsheets (Taken 03/16/2020 0000)  Body Position: supine, head elevated  Intervention: Prevent and Manage VTE (Venous Thromboembolism) Risk  Flowsheets (Taken 03/16/2020 0000)  VTE Prevention/Management: ambulation promoted  Intervention: Prevent Infection  Flowsheets (Taken 03/16/2020 0000)  Infection Prevention:   rest/sleep promoted   promote handwashing   visitors restricted/screened   single patient room provided

## 2020-03-16 NOTE — Discharge Summary (Signed)
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         REASON FOR HOSPITALIZATION AND HOSPITAL COURSE:    This is a 74 y.o., female admitted for and generalized muscle weakness.  Patient presented to the emergency room via EMS with complaints of weakness.  The patient has a history of pancreatic cancer status post [place].  Patient has had increasing generalized weakness over the past 2 weeks.  She reports she has had a decreased p.o. intake denies any fever chills.  She denies any cough or shortness of breath.  No COVID exposures.  No nausea vomiting or diarrhea.  Workup in emergency room showed hyponatremia with a sodium of 121. The patient will be admitted to the hospital service for monitoring, IV fluids, and further workup of her hyponatremia.  Patient admitted to med surge in started on normal saline 100 mL/hour.  Patient's sodium tablets were continued from home.  The patient's sodium slowly increased over the course of her admission.  This time the patient's sodium is 128 and has been improving since admission.  At this point is felt the patient could be  discharged home.  Patient follow-up with PCP in 1 week.  Patient follow-up with oncology as scheduled.      DISCHARGE MEDICATIONS:     Current Discharge Medication List      CONTINUE these medications which have CHANGED during your visit.      Details   insulin aspart U-100 100 unit/mL (3 mL) Insulin Pen  Commonly known as: NovoLOG Flexpen U-100 Insulin  What changed:    how much to take   additional instructions   7 Units, Subcutaneous, 2 TIMES DAILY BEFORE MEALS  Qty: 9 Each  Refills: 3        CONTINUE these medications - NO CHANGES were made during your visit.      Details   Acetaminophen-Butalbital 50-325 mg Tablet   Oral  Refills: 0     aspirin 81 mg Tablet, Delayed Release (E.C.)  Commonly known as: ECOTRIN   81 mg, Oral, DAILY  Refills: 0     atorvastatin 10 mg Tablet  Commonly known as: LIPITOR   TAKE 1 TABLET BY MOUTH ONCE DAILY  Qty: 90 Tab  Refills: 1     cetirizine 10 mg Tablet  Commonly known as: ZYRTEC   10 mg, Oral, DAILY  Refills: 0     dilTIAZem 30 mg Tablet  Commonly known as: CARDIZEM   30 mg, Oral, 4 TIMES DAILY  Qty: 120 Tablet  Refills: 5  furosemide 80 mg Tablet  Commonly known as: LASIX   80 mg, Oral, DAILY  Refills: 0     hydrALAZINE 50 mg Tablet  Commonly known as: APRESOLINE   25 mg, Oral, 2 TIMES DAILY  Qty: 30 Tablet  Refills: 5     HYDROcodone-acetaminophen 7.5-325 mg Tablet  Commonly known as: NORCO   TAKE 1 TABLET BY MOUTH EVERY 6 HOURS AS NEEDED FOR PAIN  Refills: 0     Lantus Solostar U-100 Insulin 100 unit/mL Insulin Pen  Generic drug: insulin glargine   10 Units, Subcutaneous, NIGHTLY  Qty: 3 mL  Refills: 5     lidocaine HCL 2 % Solution  Commonly known as: XYLOCAINE   5 mL  Refills: 0     LORazepam 0.5 mg Tablet  Commonly known as: ATIVAN   0.5 mg, Oral, EVERY EVENING  Qty: 30 Tablet  Refills: 2     losartan 100 mg Tablet  Commonly known as: COZAAR   100 mg, Oral, DAILY  Qty: 30 Tablet  Refills: 0     magnesium oxide 400 mg Tablet  Commonly known as: MAG-OX   400 mg, Oral, 2  TIMES DAILY  Qty: 60 Tablet  Refills: 5     metoprolol succinate 50 mg Tablet Sustained Release 24 hr  Commonly known as: TOPROL-XL   50 mg, Oral, DAILY  Qty: 30 Tablet  Refills: 0     Nano Pen Needle 32 gauge x 5/32" Needle  Generic drug: insulin pen needles   USE WITH INSULIN FLEXPEN BID AS NEEDED  Refills: 0     omeprazole 40 mg Capsule, Delayed Release(E.C.)  Commonly known as: PRILOSEC   40 mg, Oral, DAILY  Qty: 90 Cap  Refills: 4     pancreatic enzyme replacement 12,000-38,000 -60,000 unit Capsule, Delayed Release(E.C.)  Commonly known as: CREON   2 Capsules, Oral, 3 TIMES DAILY WITH MEALS, 2 CAPS WITH MEALS AND 1 CAP WITH EACH SNACK   Qty: 540 Capsule  Refills: 1     Relistor 150 mg Tablet  Generic drug: methylnaltrexone   Relistor 150 mg tablet  TAKE 3 TABLETS BY MOUTH DAILY  Qty: 90 Tablet  Refills: 5     sodium chloride 1 gram Tablet   1 g, Oral, 3 TIMES DAILY WITH MEALS  Qty: 90 Tablet  Refills: 5     tiotropium bromide 18 mcg Capsule, w/Inhalation Device  Commonly known as: SPIRIVA HANDIHALER   18 mcg, Inhalation, DAILY  Qty: 30 Capsule  Refills: 5            Filed Vitals:    03/16/20 0000 03/16/20 0400 03/16/20 0730 03/16/20 0832   BP: (!) 136/51 (!) 110/59 (!) 177/76    Pulse: 66 60 76    Resp: 18 20 18     Temp: 36.1 C (97 F) 36.7 C (98 F) 36.7 C (98.1 F)    SpO2:    97%      Physical Exam  Constitutional:       General: She is awake. She is not in acute distress.     Appearance: Normal appearance. She is normal weight. She is not ill-appearing.   HENT:      Head: Normocephalic and atraumatic.   Eyes:      Conjunctiva/sclera: Conjunctivae normal.      Pupils: Pupils are equal, round, and reactive to light.   Cardiovascular:      Rate and Rhythm: Normal rate and regular rhythm.  Heart sounds: Murmur heard.   Systolic murmur is present with a grade of 3/6.   No friction rub. No gallop.    Pulmonary:      Effort: No respiratory distress.      Breath sounds: Normal breath sounds. No wheezing or  rales.   Abdominal:      General: Bowel sounds are normal. There is no distension.      Palpations: Abdomen is soft. There is no mass.      Tenderness: There is no abdominal tenderness. There is no guarding or rebound.   Musculoskeletal:         General: No deformity. Normal range of motion.      Cervical back: Normal range of motion and neck supple.   Skin:     General: Skin is warm and dry.      Findings: No erythema or rash.   Neurological:      Mental Status: She is alert and oriented to person, place, and time.      Cranial Nerves: No cranial nerve deficit.   Psychiatric:         Mood and Affect: Affect normal.         Cognition and Memory: Memory normal.         Laboratory Data:     Results for orders placed or performed during the hospital encounter of 03/13/20 (from the past 24 hour(s))   POC BLOOD GLUCOSE (RESULTS)   Result Value Ref Range    GLUCOSE, POC 204 (H) 60 - 110 mg/dl   POC BLOOD GLUCOSE (RESULTS)   Result Value Ref Range    GLUCOSE, POC 238 (H) 60 - 110 mg/dl   POC BLOOD GLUCOSE (RESULTS)   Result Value Ref Range    GLUCOSE, POC 185 (H) 60 - 110 mg/dl   BASIC METABOLIC PANEL   Result Value Ref Range    SODIUM 128 (L) 136 - 145 mmol/L    POTASSIUM 3.9 3.5 - 5.1 mmol/L    CHLORIDE 104 96 - 111 mmol/L    CO2 TOTAL 16 (L) 23 - 31 mmol/L    ANION GAP 8 4 - 13 mmol/L    CALCIUM 7.8 (L) 8.8 - 10.2 mg/dL    GLUCOSE 63 (L) 65 - 125 mg/dL    BUN 13 8 - 25 mg/dL    CREATININE 0.78 0.60 - 1.05 mg/dL    BUN/CREA RATIO 17 6 - 22    ESTIMATED GFR 76 >=60 mL/min/BSA   CBC WITH DIFF   Result Value Ref Range    WBC 3.7 3.7 - 11.0 x103/uL    RBC 2.59 (L) 3.85 - 5.22 x106/uL    HGB 8.0 (L) 11.5 - 16.0 g/dL    HCT 24.5 (L) 34.8 - 46.0 %    MCV 94.6 78.0 - 100.0 fL    MCH 30.9 26.0 - 32.0 pg    MCHC 32.7 31.0 - 35.5 g/dL    RDW-CV 14.6 11.5 - 15.5 %    PLATELETS 114 (L) 150 - 400 x103/uL    MPV 11.7 8.7 - 12.5 fL    NEUTROPHIL % 50 %    LYMPHOCYTE % 35 %    MONOCYTE % 12 %    EOSINOPHIL % 2 %    BASOPHIL % 1 %     NEUTROPHIL # 1.84 1.50 - 7.70 x103/uL    LYMPHOCYTE # 1.29 1.00 - 4.80 x103/uL    MONOCYTE # 0.46 0.20 - 1.10 x103/uL  EOSINOPHIL # <0.10 <=0.50 x103/uL    BASOPHIL # <0.10 <=0.20 x103/uL    IMMATURE GRANULOCYTE % 0 0 - 1 %    IMMATURE GRANULOCYTE # <0.10 <0.10 x103/uL       Imaging Studies:    XR ABD FLAT AND UPRIGHT SERIES (W PA CHEST)   ED Interpretation by Tawanna Sat, DO (04/02 [id number])   Chest film negative for acute change.  Port-A-Cath noted in the right.  Flat and upright abdomen with nonspecific gas, no free air or air-fluid levels.      Final Result by Edi, Radresults In (04/02 1017)   Chronic chest changes. No acute cardiac or pulmonary or pleural   disease.      Port-A-Cath in place.      No free air underneath the hemidiaphragms.      No significant pathology demonstrated with conventional radiography in the   abdomen.      This note was partially created using voice recognition software and is   inherently subject to errors including those of syntax and "sound alike "   substitutions which may escape proof reading.  In such instances, original   meaning may be extrapolated by contextual derivation.         Radiologist location ID: [id number]             DISCHARGE INSTRUCTIONS:       DISCHARGE INSTRUCTION - DIET     Diet: RESUME HOME DIET      DISCHARGE INSTRUCTION - ACTIVITY     Activity: AS TOLERATED           CONDITION ON DISCHARGE: Alert, Oriented and VS Stable    DISCHARGE DISPOSITION:  Home discharge       Bethanie Dicker, MD

## 2020-03-16 NOTE — Nurses Notes (Signed)
Patient discharged home with family.  AVS reviewed with patient/care giver.  A written copy of the AVS and discharge instructions was given to the patient/care giver.  Questions sufficiently answered as needed.  Patient/care giver encouraged to follow up with PCP as indicated.  In the event of an emergency, patient/care giver instructed to call 911 or go to the nearest emergency room. Jamse Mead, RN  03/16/2020, 14:19

## 2020-03-17 ENCOUNTER — Telehealth (HOSPITAL_COMMUNITY): Payer: Self-pay

## 2020-03-17 ENCOUNTER — Encounter (INDEPENDENT_AMBULATORY_CARE_PROVIDER_SITE_OTHER): Payer: Self-pay | Admitting: FAMILY PRACTICE

## 2020-03-17 NOTE — Nursing Note (Signed)
Received possible TCM from [facility] on [place] date 03/16/2020. Spoke with Benjamine Mola at [facility] at was notified that pt was her list of tcm's to do today. BE

## 2020-03-17 NOTE — Nursing Note (Signed)
(  dot)Transition of Care Contact  Discharge Date: 03/16/2020  Transition Facility Type--Hospital (Inpatient or Observation)  [facility]  Interactive Contact(s): Completed or attempted contact indicated by Date/Time  Completed Contact--03/17/2020  2:25 PM  First Attempt  Second Attempt  Third Attempt  Contact Method(s)-- Patient/Caregiver Telephone  Clinical Staff Name/Role who [name], LPN  Transition Assessment  Discharge Summary obtained?--Yes  How are you recovering?--Improving  Discharge Meds obtained?  Medications reconcilled with Discharge Documentation?--Yes  Medication understanding --no new discharge meds prescribed  Medication Concerns?--No  Have everything needed for recovery?--Yes  Care Coordination:   Patient has transition follow-up appointment date and time?--Yes  Primary Care Transition Visit planned?--Yes  Specialist Transition Visit planned?  Patient/caregiver plans to attend transition visit?  Primary Follow-up Barrier  Interventions provided --reinforced discharge instructions--follow-up appointment made--patient expresses understanding of follow-up plan  Home Health or DME ordered at discharge?--Yes  Agency has contacted patient to initiate services?--Yes  Home Health or DME Agency:--[facility]  Clinician/Team notified?  Primary reason clinician notified?  Transition Note:   03/17/2020 1425--Patient states she is still feeling "tired", otherwise ok.  Home meds taken as directed.  Home Health nurse in this morning.  No new complaints.  Follows up with Dr Jerilynn Mages. Wantz on 03/23/2020 at 1215.     Further information may be documented in relevant telephone or outreach encounter.

## 2020-03-17 NOTE — Care Management Notes (Signed)
Referral Information  ++++++ Placed Provider #1 ++++++  Case Manager: Carla Johnson  Provider Type: Home Health - Return  Provider Name: [facility] [place]  Address:  [address]  [place], [place] [address]  Contact: Tracy McClure    Phone: [phone] x  Fax:   Fax: [phone]

## 2020-03-19 ENCOUNTER — Other Ambulatory Visit (INDEPENDENT_AMBULATORY_CARE_PROVIDER_SITE_OTHER): Payer: Self-pay | Admitting: FAMILY PRACTICE

## 2020-03-19 DIAGNOSIS — E1142 Type 2 diabetes mellitus with diabetic polyneuropathy: Secondary | ICD-10-CM

## 2020-03-19 MED ORDER — LOSARTAN 100 MG TABLET
100.00 mg | ORAL_TABLET | Freq: Every day | ORAL | 0 refills | Status: DC
Start: 2020-03-19 — End: 2020-03-20

## 2020-03-19 MED ORDER — FUROSEMIDE 80 MG TABLET
80.00 mg | ORAL_TABLET | Freq: Every day | ORAL | 0 refills | Status: DC
Start: 2020-03-19 — End: 2020-03-20

## 2020-03-20 ENCOUNTER — Other Ambulatory Visit: Payer: Self-pay

## 2020-03-20 ENCOUNTER — Ambulatory Visit: Payer: Medicare Other | Attending: FAMILY PRACTICE

## 2020-03-20 ENCOUNTER — Other Ambulatory Visit (INDEPENDENT_AMBULATORY_CARE_PROVIDER_SITE_OTHER): Payer: Self-pay | Admitting: FAMILY PRACTICE

## 2020-03-20 DIAGNOSIS — D649 Anemia, unspecified: Secondary | ICD-10-CM | POA: Insufficient documentation

## 2020-03-20 DIAGNOSIS — I509 Heart failure, unspecified: Secondary | ICD-10-CM

## 2020-03-20 LAB — COMPREHENSIVE METABOLIC PANEL, NON-FASTING
ALBUMIN: 2.7 g/dL — ABNORMAL LOW (ref 3.4–4.8)
ALKALINE PHOSPHATASE: 118 U/L (ref 55–145)
ALT (SGPT): 11 U/L (ref 8–22)
ANION GAP: 10 mmol/L (ref 4–13)
AST (SGOT): 19 U/L (ref 8–45)
BILIRUBIN TOTAL: 0.3 mg/dL (ref 0.3–1.3)
BUN/CREA RATIO: 18 (ref 6–22)
BUN: 18 mg/dL (ref 8–25)
CALCIUM: 8.1 mg/dL — ABNORMAL LOW (ref 8.8–10.2)
CHLORIDE: 98 mmol/L (ref 96–111)
CO2 TOTAL: 21 mmol/L — ABNORMAL LOW (ref 23–31)
CREATININE: 0.98 mg/dL (ref 0.60–1.05)
ESTIMATED GFR: 58 mL/min/BSA — ABNORMAL LOW (ref >=60)
GLUCOSE: 204 mg/dL — ABNORMAL HIGH (ref 65–125)
POTASSIUM: 4.6 mmol/L (ref 3.5–5.1)
PROTEIN TOTAL: 4.7 g/dL — ABNORMAL LOW (ref 6.0–8.0)
SODIUM: 129 mmol/L — ABNORMAL LOW (ref 136–145)

## 2020-03-20 LAB — CBC WITH DIFF
BASOPHIL #: <0.1 10*3/uL (ref <=0.20)
BASOPHIL %: 1 %
EOSINOPHIL #: <0.1 10*3/uL (ref <=0.50)
EOSINOPHIL %: 2 %
HCT: 32.2 % — ABNORMAL LOW (ref 34.8–46.0)
HGB: 10.1 g/dL — ABNORMAL LOW (ref 11.5–16.0)
IMMATURE GRANULOCYTE #: <0.1 10*3/uL (ref <0.10)
IMMATURE GRANULOCYTE %: 0 % (ref 0–1)
LYMPHOCYTE #: 1.55 10*3/uL (ref 1.00–4.80)
LYMPHOCYTE %: 37 %
MCH: 29.8 pg (ref 26.0–32.0)
MCHC: 31.4 g/dL (ref 31.0–35.5)
MCV: 95 fL (ref 78.0–100.0)
MONOCYTE #: 0.34 10*3/uL (ref 0.20–1.10)
MONOCYTE %: 8 %
MPV: 12.4 fL (ref 8.7–12.5)
NEUTROPHIL #: 2.22 10*3/uL (ref 1.50–7.70)
NEUTROPHIL %: 52 %
PLATELETS: 136 10*3/uL — ABNORMAL LOW (ref 150–400)
RBC: 3.39 10*6/uL — ABNORMAL LOW (ref 3.85–5.22)
RDW-CV: 14.5 % (ref 11.5–15.5)
WBC: 4.3 10*3/uL (ref 3.7–11.0)

## 2020-03-20 MED ORDER — FUROSEMIDE 80 MG TABLET
80.00 mg | ORAL_TABLET | Freq: Every day | ORAL | 0 refills | Status: DC
Start: 2020-03-20 — End: 2020-03-22

## 2020-03-20 MED ORDER — LOSARTAN 100 MG TABLET
100.00 mg | ORAL_TABLET | Freq: Every day | ORAL | 0 refills | Status: DC
Start: 2020-03-20 — End: 2020-03-22

## 2020-03-22 ENCOUNTER — Other Ambulatory Visit (INDEPENDENT_AMBULATORY_CARE_PROVIDER_SITE_OTHER): Payer: Self-pay | Admitting: FAMILY MEDICINE

## 2020-03-22 ENCOUNTER — Other Ambulatory Visit (INDEPENDENT_AMBULATORY_CARE_PROVIDER_SITE_OTHER): Payer: Self-pay | Admitting: FAMILY PRACTICE

## 2020-03-23 ENCOUNTER — Emergency Department
Admission: EM | Admit: 2020-03-23 | Discharge: 2020-03-23 | Disposition: A | Discharge status: Home / Self Care | Payer: Medicare Other | Attending: Emergency Medicine | Admitting: Emergency Medicine

## 2020-03-23 ENCOUNTER — Telehealth (INDEPENDENT_AMBULATORY_CARE_PROVIDER_SITE_OTHER): Payer: Self-pay | Admitting: FAMILY PRACTICE

## 2020-03-23 ENCOUNTER — Other Ambulatory Visit: Payer: Self-pay

## 2020-03-23 ENCOUNTER — Encounter (INDEPENDENT_AMBULATORY_CARE_PROVIDER_SITE_OTHER): Payer: Self-pay | Admitting: FAMILY PRACTICE

## 2020-03-23 DIAGNOSIS — Z794 Long term (current) use of insulin: Secondary | ICD-10-CM | POA: Insufficient documentation

## 2020-03-23 DIAGNOSIS — E11649 Type 2 diabetes mellitus with hypoglycemia without coma: Secondary | ICD-10-CM | POA: Insufficient documentation

## 2020-03-23 DIAGNOSIS — N179 Acute kidney failure, unspecified: Secondary | ICD-10-CM | POA: Insufficient documentation

## 2020-03-23 DIAGNOSIS — E162 Hypoglycemia, unspecified: Secondary | ICD-10-CM

## 2020-03-23 DIAGNOSIS — Z87891 Personal history of nicotine dependence: Secondary | ICD-10-CM | POA: Insufficient documentation

## 2020-03-23 LAB — CBC WITH DIFF
BASOPHIL #: <0.1 10*3/uL (ref <=0.20)
BASOPHIL %: 1 %
EOSINOPHIL #: <0.1 10*3/uL (ref <=0.50)
EOSINOPHIL %: 1 %
HCT: 32.5 % — ABNORMAL LOW (ref 34.8–46.0)
HGB: 10.4 g/dL — ABNORMAL LOW (ref 11.5–16.0)
IMMATURE GRANULOCYTE #: <0.1 10*3/uL (ref <0.10)
IMMATURE GRANULOCYTE %: 1 % (ref 0–1)
LYMPHOCYTE #: 1.14 10*3/uL (ref 1.00–4.80)
LYMPHOCYTE %: 26 %
MCH: 30.5 pg (ref 26.0–32.0)
MCHC: 32 g/dL (ref 31.0–35.5)
MCV: 95.3 fL (ref 78.0–100.0)
MONOCYTE #: 0.52 10*3/uL (ref 0.20–1.10)
MONOCYTE %: 12 %
MPV: 11.2 fL (ref 8.7–12.5)
NEUTROPHIL #: 2.65 10*3/uL (ref 1.50–7.70)
NEUTROPHIL %: 59 %
PLATELETS: 152 10*3/uL (ref 150–400)
RBC: 3.41 10*6/uL — ABNORMAL LOW (ref 3.85–5.22)
RDW-CV: 14.3 % (ref 11.5–15.5)
WBC: 4.4 10*3/uL (ref 3.7–11.0)

## 2020-03-23 LAB — URINALYSIS, MACROSCOPIC
BILIRUBIN: NEGATIVE mg/dL
BLOOD: NEGATIVE mg/dL
GLUCOSE: NEGATIVE mg/dL
KETONES: NEGATIVE mg/dL
LEUKOCYTES: NEGATIVE WBCs/uL
NITRITE: NEGATIVE
PH: 5.5 (ref 5.0–8.5)
PROTEIN: 100 mg/dL — AB
SPECIFIC GRAVITY: 1.015 (ref 1.005–1.030)
UROBILINOGEN: 0.2 mg/dL

## 2020-03-23 LAB — BASIC METABOLIC PANEL
ANION GAP: 8 mmol/L (ref 4–13)
BUN/CREA RATIO: 15 (ref 6–22)
BUN: 19 mg/dL (ref 8–25)
CALCIUM: 8.5 mg/dL — ABNORMAL LOW (ref 8.8–10.2)
CHLORIDE: 102 mmol/L (ref 96–111)
CO2 TOTAL: 22 mmol/L — ABNORMAL LOW (ref 23–31)
CREATININE: 1.23 mg/dL — ABNORMAL HIGH (ref 0.60–1.05)
ESTIMATED GFR: 44 mL/min/BSA — ABNORMAL LOW (ref >=60)
GLUCOSE: 94 mg/dL (ref 65–125)
POTASSIUM: 4.5 mmol/L (ref 3.5–5.1)
SODIUM: 132 mmol/L — ABNORMAL LOW (ref 136–145)

## 2020-03-23 LAB — URINALYSIS, MICROSCOPIC

## 2020-03-23 LAB — GRAY TOP TUBE

## 2020-03-23 LAB — POC BLOOD GLUCOSE (RESULTS)
GLUCOSE, POC: 114 mg/dL — ABNORMAL HIGH (ref 60–110)
GLUCOSE, POC: 241 mg/dL — ABNORMAL HIGH (ref 60–110)

## 2020-03-23 MED ORDER — LOSARTAN 100 MG TABLET
100.00 mg | ORAL_TABLET | Freq: Every day | ORAL | 0 refills | Status: DC
Start: 2020-03-23 — End: 2020-07-03

## 2020-03-23 MED ORDER — SODIUM CHLORIDE 0.9 % IV BOLUS
1000.00 mL | INJECTION | Status: AC
Start: 2020-03-23 — End: 2020-03-23
  Administered 2020-03-23: 1000 mL via INTRAVENOUS
  Administered 2020-03-23: 0 mL via INTRAVENOUS

## 2020-03-23 MED ORDER — FUROSEMIDE 80 MG TABLET
80.00 mg | ORAL_TABLET | Freq: Every day | ORAL | 0 refills | Status: DC
Start: 2020-03-23 — End: 2020-04-16

## 2020-03-23 NOTE — Progress Notes (Signed)
Please call the patient tell from the test results were abnormal but improved compared to last results, drmark

## 2020-03-23 NOTE — ED Nurses Note (Signed)
Provider assessment complete  Diabetic breakfast tray ordered for patient  Patient appears sleepy / oriented x 4  Bed in lowest position with call light within reach  Patient arrives via EMS with IV intact and bilateral lower extremity swelling - doctor assessed extremities  Blankets x 3 provided for pt comfort / socks applied to bare feet in attempt to warm patient

## 2020-03-23 NOTE — ED Nurses Note (Signed)
Provider bedside speaking with patient  Warm pack applied to axillary area for warmth  Patient setting up high fowlers eating breakfast with continuous cardiac monitoring remaining in place  Bedside commode bedside for attempt to void post breakfast  Pt remains alert and oriented x 4  Call light within reach with pt teach back

## 2020-03-23 NOTE — ED Nurses Note (Signed)
To exit via wheelchair with staff. Son Ruthann Cancer given AVS verbalize understanding.

## 2020-03-23 NOTE — ED Nurses Note (Signed)
Resting. Call light reach. Side rails up.

## 2020-03-23 NOTE — ED Provider Notes (Signed)
[name]  1946-02-25  74 y.o.  female  [place] [address]   [phone] (home)    Chief Complaint:   Chief Complaint   Patient presents with    Hypoglycemia     hypoglycemic episode at home - pt arrives via EMS - initial blood sugar =40 / EMS reports administering 1 amp D50 - blood glucose now 132 - pt denies nausea - pt complaint of coldness - blanket provided - pt appears sleepy / oriented x 4       HPI: This is a 74 y.o. female who presents to the emergency department brought by EMS for hypoglycemia.  Patient was reportedly unresponsive, EMS was called and patient had a glucose of 40 mg/dL.  IV was initiated, and 25 g of D 20 W were administered, and patient was much more alert on arrival to the ER.  Accu-Chek on arrival was 132 mg/dL.  Patient states she was doing well yesterday, however does not recall her evening Accu-Chek.  She is on Humalog q.a.c. and 10 units of Lantus at at night.  Patient denies any illness or other symptoms that could have precipitated this event.    COVID screening negative.    Past Medical History:   Past Medical History:   Diagnosis Date    Abdominal hernia 10/18/2017    hx of repair    Anxiety     Arthritis     Asthma     Atrial fibrillation (CMS HCC)     Back problem     Bruises easily     Cancer (CMS [place]) 10/18/2017    chemo and radiation completed 09/24/2017    COPD (chronic obstructive pulmonary disease) (CMS HCC)     CPAP (continuous positive airway pressure) dependence 10/18/2017    has not used recently    Depression     DM (diabetes mellitus) (CMS [place]) 2000    Fasting BG 200's. HGA1C 2018 6    Edema     Essential hypertension     GERD (gastroesophageal reflux disease)     Headache     Heartburn     Hx antineoplastic chemo 2018    Hx of radiation therapy 2018    Hypercholesteremia     Hyperlipidemia     Migraine 10/18/2017    none recently    Obesity     Palpitations     Pancreatic cancer (CMS [place]) 03/2017    Panic attack     PE (pulmonary  thromboembolism) (CMS HCC) 03/29/2018    Peripheral neuropathy 10/18/2017    knees down, bilateral    Sepsis (CMS [place]) 04/15/2019    Sleep apnea     Type 2 diabetes mellitus (CMS [place]) 10/18/2017    Dx 2000 FBS 200s    Unintentional weight loss 10/18/2017    25 lbs 03/2017    UTI (urinary tract infection)     Wears glasses      (Not in an outpatient encounter)     Past Surgical History:   Past Surgical History:   Procedure Laterality Date    Cesarean section      Colonoscopy  10/04/2019    Hx cesarean section      Hx cholecystectomy      Hx hernia repair      Hx subclavian port implantion      Hx subclavian port implantion      Hx tonsil and adenoidectomy      Hx tonsillectomy      Pancreaticoduodenectomy  [id number]    Umbilical hernia repair      Whipple procedure w/ laparoscopy         Social History:   Social History     Tobacco Use    Smoking status: Former Smoker     Packs/day: 0.50     Years: 20.00     Pack years: 10.00     Types: Cigarettes    Smokeless tobacco: Never Used    Tobacco comment: unsure of quit date   Vaping Use    Vaping Use: Never used   Substance Use Topics    Alcohol use: Never    Drug use: Never        Family History:  Family History   Problem Relation Age of Onset    Heart Disease Mother     Diabetes Mother     Heart Attack Mother 19    Other Mother 59        Bypass surgery    Respiratory Problems Father     Alzheimer's/Dementia Father 57    Lung disease Father         Black lung, lung removal    Cancer Sister     Pacemaker Sister     Heart Disease Brother     Hypertension (High Blood Pressure) Other      Prior to Admission medications    Medication Sig Start Date End Date Taking? Authorizing Provider   Acetaminophen-Butalbital 50-325 mg Oral Tablet Take by mouth    Provider, Historical   aspirin (ECOTRIN) 81 mg Oral Tablet, Delayed Release (E.C.) Take 1 Tab (81 mg total) by mouth Once a day 04/20/19   Hyacinth Meeker, MD   atorvastatin (LIPITOR) 10 mg Oral  Tablet TAKE 1 TABLET BY MOUTH ONCE DAILY 01/13/20   Ignacia Felling, MD   cetirizine (ZYRTEC) 10 mg Oral Tablet Take 1 Tab (10 mg total) by mouth Once a day 04/20/19   Hyacinth Meeker, MD   dilTIAZem (CARDIZEM) 30 mg Oral Tablet Take 1 Tablet (30 mg total) by mouth Four times a day for 30 days 03/02/20 04/01/20  Myrene Buddy, MD   furosemide (LASIX) 80 mg Oral Tablet Take 1 Tablet (80 mg total) by mouth Once a day for 90 days 03/20/20 06/18/20  Myrene Buddy, MD   hydrALAZINE (APRESOLINE) 50 mg Oral Tablet Take 0.5 Tablets (25 mg total) by mouth Twice daily for 30 days 02/11/20 03/12/20  Myrene Buddy, MD   HYDROcodone-acetaminophen (NORCO) 7.5-325 mg Oral Tablet TAKE 1 TABLET BY MOUTH EVERY 6 HOURS AS NEEDED FOR PAIN 02/19/20   Provider, Historical   insulin aspart U-100 (NOVOLOG FLEXPEN U-100 INSULIN) 100 unit/mL (3 mL) Subcutaneous Insulin Pen 7 Units by Subcutaneous route Twice a day before meals  Patient taking differently: by Subcutaneous route Twice a day before meals Per sliding scale 01/22/20   Charlie Pitter, MD   insulin glargine (LANTUS SOLOSTAR U-100 INSULIN) 100 unit/mL Subcutaneous Insulin Pen 10 Units by Subcutaneous route Every night 11/25/19   Myrene Buddy, MD   lidocaine HCL (XYLOCAINE) 2 % Mucous Membrane Solution 5 mL 05/02/17   Provider, Historical   LORazepam (ATIVAN) 0.5 mg Oral Tablet Take 1 Tablet (0.5 mg total) by mouth Every evening 03/02/20   Myrene Buddy, MD   losartan (COZAAR) 100 mg Oral Tablet Take 1 Tablet (100 mg total) by mouth Once a day for 90 days 03/20/20 06/18/20  Myrene Buddy, MD   magnesium oxide (MAG-OX) 400  mg Oral Tablet Take 1 Tablet (400 mg total) by mouth Twice daily for 30 days 03/02/20 04/01/20  Myrene Buddy, MD   methylnaltrexone (RELISTOR) 150 mg Oral Tablet Relistor 150 mg tablet  TAKE 3 TABLETS BY MOUTH DAILY 03/02/20   Myrene Buddy, MD   metoprolol succinate (TOPROL-XL) 50 mg Oral Tablet Sustained Release 24 hr Take 1 Tablet (50 mg total) by mouth Once a day 02/07/20    Bethanie Dicker, MD   NANO PEN NEEDLE 32 gauge x 5/32" Needle USE WITH INSULIN FLEXPEN BID AS NEEDED 08/06/19   Provider, Historical   omeprazole (PRILOSEC) 40 mg Oral Capsule, Delayed Release(E.C.) Take 1 Cap (40 mg total) by mouth Once a day 02/07/18   Nicola Police, MD   pancreatic enzyme replacement (CREON) 12,000-38,000 -60,000 unit Oral Capsule, Delayed Release(E.C.) Take 2 Capsules by mouth Three times daily with meals 2 CAPS WITH MEALS AND 1 CAP WITH EACH [facility] 02/17/20   Myrene Buddy, MD   sodium chloride 1 gram Oral Tablet Take 1 Tablet (1 g total) by mouth Three times daily with meals for 30 days 03/02/20 04/01/20  Myrene Buddy, MD   tiotropium bromide (SPIRIVA HANDIHALER) 18 mcg Inhalation Capsule, w/Inhalation Device Take 1 Capsule (18 mcg total) by inhalation Once a day for 180 days 02/24/20 08/22/20  Myrene Buddy, MD   spironolactone (ALDACTONE) 25 mg Oral Tablet Take 1 Tablet (25 mg total) by mouth Once a day for 180 days 02/11/20 02/27/20  Myrene Buddy, MD          Allergies:   Allergies   Allergen Reactions    Sulfa (Sulfonamides) Itching       Above history reviewed with patient.  Allergies, medication list, reviewed.       Review of systems  Constitutional: Negative for fever. Negative for chills.   Skin: Negative for rash or other lesions.   HENT: Negative for headaches.  Denies smell or taste deficits or pharyngitis.  Eyes: Negative for blurred vision and double vision.   Cardiovascular: Negative for chest pain and palpitations.   Respiratory: Negative for cough. Is not experiencing shortness of breath. No wheezing.  Gastrointestinal: Negative for nausea, vomiting and abdominal pain. No diarrhea, constipation or hematochezia.  Genitourinary: Negative for dysuria, urgency, frequency, or hematuria.   Musculoskeletal: Negative for neck pain and back pain.   Neurological:  Decreased LOC as above.  Recovered after IV dextrose was given.  No numbness, tingling, or other sensation changed.  Psych:   Denies anxiety or depression.  All other systems reviewed and are negative.        Physical Exam  ED Triage Vitals [03/23/20 0725]   BP (Non-Invasive) (!) 154/47   Heart Rate 61   Respiratory Rate 19   Temperature 35.9 C (96.6 F)   SpO2 100 %   Weight 79.8 kg (176 lb)   Height 1.575 m (5\' 2" )     Constitutional: patient is oriented to person, place, and time and well-developed, well-nourished, and in no distress.  Vital signs and nursing notes reviewed.  Patient is alert, and in no distress.  HENT:   Head: Normocephalic and atraumatic.   Mouth/Throat: Oropharynx is clear and moist.   Eyes: Pupils are equal, round, and reactive to light. Conjunctivae and EOM are normal.   Neck: Normal range of motion. Neck supple.  No adenopathy.  Cardiovascular: Normal rate, regular rhythm, normal heart sounds and intact distal pulses.  No murmurs.  Normal  S1-S2.  Pulmonary/Chest: Effort normal and breath sounds normal.  No rales, rhonchi, or wheezes.  Abdominal: Soft. Bowel sounds are normal.  No masses.  Nontender.  Scars noted from previous pancreatic surgery.  Musculoskeletal: Normal range of motion.  3+ lower extremity edema.  Neurological: Patient is alert and oriented to person, place, and time.  Cranial nerves 2-12 grossly intact.  No tremors.  GCS score is 15.   Skin: Skin is warm and dry.  No rashes, lesions, or jaundice.  Psychiatric: Mood, memory, affect and judgment normal.    The following orders were placed after examining the patient :  Orders Placed This Encounter    CBC/DIFF    URINALYSIS, MACROSCOPIC AND MICROSCOPIC    BASIC METABOLIC PANEL    CBC WITH DIFF    URINALYSIS, MACROSCOPIC    URINALYSIS, MICROSCOPIC    EXTRA TUBES    GRAY TOP TUBE    NS bolus infusion 1,000 mL      No orders to display      Labs Reviewed   BASIC METABOLIC PANEL - Abnormal; Notable for the following components:       Result Value    SODIUM 132 (*)     CO2 TOTAL 22 (*)     CALCIUM 8.5 (*)     CREATININE 1.23 (*)     ESTIMATED  GFR 44 (*)     All other components within normal limits   CBC WITH DIFF - Abnormal; Notable for the following components:    RBC 3.41 (*)     HGB 10.4 (*)     HCT 32.5 (*)     All other components within normal limits   URINALYSIS, MACROSCOPIC - Abnormal; Notable for the following components:    PROTEIN 100  (*)     All other components within normal limits   URINALYSIS, MICROSCOPIC - Abnormal; Notable for the following components:    BACTERIA Rare (*)     All other components within normal limits   POC BLOOD GLUCOSE (RESULTS) - Abnormal; Notable for the following components:    GLUCOSE, POC 114 (*)     All other components within normal limits   POC BLOOD GLUCOSE (RESULTS) - Abnormal; Notable for the following components:    GLUCOSE, POC 241 (*)     All other components within normal limits   CBC/DIFF    Narrative:     The following orders were created for panel order CBC/DIFF.  Procedure                               Abnormality         Status                     ---------                               -----------         ------                     CBC WITH [id number]                Abnormal            Final result                 Please view results for these tests  on the individual orders.   URINALYSIS, MACROSCOPIC AND MICROSCOPIC    Narrative:     The following orders were created for panel order URINALYSIS, MACROSCOPIC AND MICROSCOPIC.  Procedure                               Abnormality         Status                     ---------                               -----------         ------                     URINALYSIS, MACROSCOPIC[[id number]]      Abnormal            Final result               URINALYSIS, MICROSCOPIC[[id number]]      Abnormal            Final result                 Please view results for these tests on the individual orders.   EXTRA TUBES    Narrative:     The following orders were created for panel order EXTRA TUBES.  Procedure                               Abnormality         Status                      ---------                               -----------         ------                     Feliberto Gottron [id number]                                    Final result                 Please view results for these tests on the individual orders.   GRAY TOP TUBE          ED Course:     74 year old white female patient with diabetes presents the ER via EMS after hypoglycemic episode.  Patient denies any previous illness, and states she was doing well yesterday.  She has not had a change in her insulin therapy.    After history and physical examination, patient was provided a diet tray, and laboratory data was ordered.  Will continue to monitor.  Anticipate discharge home.    Most recent Accu-Chek is 241 mg/dL, patient is alert and oriented.  She will be discharged home follow up, she is also to maintain very close monitoring of her glucose with 4-5 Accu-Cheks per day and keep a diary for her primary care provider.  Patient will return if symptoms change.    Comments added by Tawanna Sat, DO on 03/23/20 at 11:34.  Medications   NS bolus infusion 1,000 mL (0 mL Intravenous Stopped 03/23/20 1047)      Procedures    Findings and diagnosis discussed with patient.    Clinical Impression:   Encounter Diagnoses   Name Primary?    Hypoglycemia Yes    Acute kidney injury (nontraumatic) (CMS HCC)        Disposition: Discharged    No follow-ups on file.   New Prescriptions    No medications on file      Myrene Buddy, MD  [address]  [place]  [address]  [phone]      As needed     BP (!) 162/75    Pulse 70    Temp 35.9 C (96.6 F)    Resp 19    Ht 1.575 m (5\' 2" )    Wt 79.8 kg (176 lb)    SpO2 100%    BMI 32.19 kg/m        Tawanna Sat, DO       This note was partially created using voice recognition software and is inherently subject to errors including those of syntax and "sound alike " substitutions which may escape proof reading.  In such instances, original meaning may be extrapolated by  contextual derivation.

## 2020-03-23 NOTE — ED Nurses Note (Signed)
Patient assisted by self to bedside commode for urine collection  UA to lab for analysis  100% of meal tray complete  Pt reports not taking morning mediations prior to arrival at hospital

## 2020-03-23 NOTE — Telephone Encounter (Signed)
-----   Message from Myrene Buddy, MD sent at 03/23/2020  8:12 AM EDT -----  Please call the patient tell from the test results were abnormal but improved compared to last results, drmark

## 2020-03-23 NOTE — ED Nurses Note (Signed)
Mary daughter in law updated.

## 2020-03-23 NOTE — ED Nurses Note (Signed)
Lab tech bedside for blood draw   Patient continues to eat breakfast   Pt continues to be cold - another blanket provided and additional warm pack placed on left lower back  Pt fluid intake = 974ml  Speech logical, spontaneous, and clear

## 2020-03-23 NOTE — ED Nurses Note (Signed)
Assisted to [facility] and back to bed. Call light in reach.

## 2020-03-23 NOTE — ED Nurses Note (Addendum)
Patient alert and oriented x4.

## 2020-03-23 NOTE — Discharge Instructions (Addendum)
Continue current medications.  Push fluids.  Your kidney functions were elevated slightly today, however should return to normal within a day or so with good hydration.  Please follow-up with primary care provider for recheck.  Monitor blood pressure very closely, check at least 4 to 5 times a day.  Follow with primary care provider as warranted or needed.  Return if symptoms change or other concerns arise.

## 2020-03-23 NOTE — Telephone Encounter (Signed)
These rxs already sent and received at pharmacy on 03/20/20. Jettie Pagan, RN

## 2020-03-23 NOTE — ED Nurses Note (Signed)
Updated [facility] Kidney daughter in law that patient is being discharged. Review avs. Family with come to transport home. IVL removed tip intact.

## 2020-03-23 NOTE — ED Nurses Note (Signed)
Resting in bed. No signs of distress. Call light in reach. Drinking coffee. Warm blankets provided.

## 2020-03-24 ENCOUNTER — Other Ambulatory Visit (INDEPENDENT_AMBULATORY_CARE_PROVIDER_SITE_OTHER): Payer: Self-pay | Admitting: FAMILY PRACTICE

## 2020-03-24 ENCOUNTER — Telehealth (INDEPENDENT_AMBULATORY_CARE_PROVIDER_SITE_OTHER): Payer: Self-pay | Admitting: FAMILY PRACTICE

## 2020-03-24 MED ORDER — INSULIN ASPART (U-100) 100 UNIT/ML (3 ML) SUBCUTANEOUS PEN
7.00 [IU] | PEN_INJECTOR | Freq: Two times a day (BID) | SUBCUTANEOUS | 3 refills | Status: DC
Start: 2020-03-24 — End: 2020-03-26

## 2020-03-24 MED ORDER — TRIMETHOPRIM 100 MG TABLET
100.00 mg | ORAL_TABLET | Freq: Two times a day (BID) | ORAL | 5 refills | Status: DC
Start: 2020-03-24 — End: 2020-04-30

## 2020-03-24 NOTE — Telephone Encounter (Signed)
-----   Message from Lucretia Field, RN sent at 03/23/2020  4:04 PM EDT -----  Regarding: FW: Non-Urgent Medical Question  Contact: [phone]    ----- Message -----  From: [name]  Sent: 03/23/2020   3:02 PM EDT  To: , #  Subject: RE: Non-Urgent Medical Question                  They allowed grandma to come home today and we are thankful she was an "easy fix" this morning. I would appreciate it greatly if you would consider the prophylactic antibiotic as I feel confident it may help her to stay home and have less emergency/hospital visits.     Thank you for all you do-     Allea Hertzberg   [id number]   ----- Message -----  From: Myrene Buddy, MD  Sent: 03/23/20, 11:12 AM  To: [name]  Subject: RE: Non-Urgent Medical Question    We will consider it      ----- Message -----       From:Brittinee Ballman       Sent:03/23/2020  9:58 AM EDT         [id number] Lanier Clam, MD    Subject:Non-Urgent Medical Question    Good morning Dr [name],    I wanted to let you know we had to call the ambulance on grandma this morning. She had UTI symptoms again, so she won't make the tele-visit today - if all goes well for her and she gets to come home would you consider putting her on a prophylactic antibiotic? It's just a thought considering the trouble she has had.    Thank you for your time and attention.    Geronimo Running  [phone]

## 2020-03-24 NOTE — Telephone Encounter (Signed)
Call and tell the Caregiver that I have sent in and Rx for trimethoprim 100mg  PO Daily #30 with 5 refills.  We need to have home health check a BMP next week as it is possible that this med in combination with losartan may increase her K+ levels.  We will need to keep and eye on this.

## 2020-03-24 NOTE — Telephone Encounter (Signed)
[name], Pt caregiver notified that rx for trimpex abx prophylaxis was erx'd to pharmacy. BMP to be drawn next week per home health due to possibility of elevated potassium levels with losartan and trimpex combination; understanding verbalized. BE

## 2020-03-25 ENCOUNTER — Other Ambulatory Visit (INDEPENDENT_AMBULATORY_CARE_PROVIDER_SITE_OTHER): Payer: Self-pay | Admitting: FAMILY PRACTICE

## 2020-03-25 NOTE — Telephone Encounter (Signed)
Per patient's medication list in chart, the requested rxs have been sent to requested pharmacy and confirmed as received. . Me

## 2020-03-26 ENCOUNTER — Encounter (HOSPITAL_COMMUNITY): Payer: Self-pay

## 2020-03-26 ENCOUNTER — Inpatient Hospital Stay
Admission: EM | Admit: 2020-03-26 | Discharge: 2020-03-28 | DRG: 948 | Disposition: A | Discharge status: Home Health | Payer: Medicare Other | Attending: Emergency Medicine | Admitting: Emergency Medicine

## 2020-03-26 ENCOUNTER — Telehealth (INDEPENDENT_AMBULATORY_CARE_PROVIDER_SITE_OTHER): Payer: Self-pay | Admitting: FAMILY PRACTICE

## 2020-03-26 ENCOUNTER — Other Ambulatory Visit (INDEPENDENT_AMBULATORY_CARE_PROVIDER_SITE_OTHER): Payer: Self-pay | Admitting: FAMILY PRACTICE

## 2020-03-26 ENCOUNTER — Other Ambulatory Visit: Payer: Self-pay

## 2020-03-26 ENCOUNTER — Emergency Department (HOSPITAL_COMMUNITY): Payer: Medicare Other

## 2020-03-26 ENCOUNTER — Inpatient Hospital Stay (HOSPITAL_COMMUNITY): Admitting: Family Medicine

## 2020-03-26 DIAGNOSIS — K7460 Unspecified cirrhosis of liver: Secondary | ICD-10-CM

## 2020-03-26 DIAGNOSIS — N390 Urinary tract infection, site not specified: Secondary | ICD-10-CM | POA: Diagnosis present

## 2020-03-26 DIAGNOSIS — J449 Chronic obstructive pulmonary disease, unspecified: Secondary | ICD-10-CM | POA: Diagnosis present

## 2020-03-26 DIAGNOSIS — Z86711 Personal history of pulmonary embolism: Secondary | ICD-10-CM

## 2020-03-26 DIAGNOSIS — I4891 Unspecified atrial fibrillation: Secondary | ICD-10-CM | POA: Diagnosis present

## 2020-03-26 DIAGNOSIS — E669 Obesity, unspecified: Secondary | ICD-10-CM | POA: Diagnosis present

## 2020-03-26 DIAGNOSIS — E11649 Type 2 diabetes mellitus with hypoglycemia without coma: Secondary | ICD-10-CM | POA: Diagnosis present

## 2020-03-26 DIAGNOSIS — Z6835 Body mass index (BMI) 35.0-35.9, adult: Secondary | ICD-10-CM

## 2020-03-26 DIAGNOSIS — Z7982 Long term (current) use of aspirin: Secondary | ICD-10-CM

## 2020-03-26 DIAGNOSIS — R601 Generalized edema: Secondary | ICD-10-CM | POA: Diagnosis present

## 2020-03-26 DIAGNOSIS — R680 Hypothermia, not associated with low environmental temperature: Principal | ICD-10-CM | POA: Diagnosis present

## 2020-03-26 DIAGNOSIS — E119 Type 2 diabetes mellitus without complications: Secondary | ICD-10-CM

## 2020-03-26 DIAGNOSIS — Z79899 Other long term (current) drug therapy: Secondary | ICD-10-CM

## 2020-03-26 DIAGNOSIS — E440 Moderate protein-calorie malnutrition: Secondary | ICD-10-CM

## 2020-03-26 DIAGNOSIS — G4730 Sleep apnea, unspecified: Secondary | ICD-10-CM | POA: Diagnosis present

## 2020-03-26 DIAGNOSIS — C7802 Secondary malignant neoplasm of left lung: Secondary | ICD-10-CM

## 2020-03-26 DIAGNOSIS — E1142 Type 2 diabetes mellitus with diabetic polyneuropathy: Secondary | ICD-10-CM | POA: Diagnosis present

## 2020-03-26 DIAGNOSIS — E162 Hypoglycemia, unspecified: Secondary | ICD-10-CM

## 2020-03-26 DIAGNOSIS — M1990 Unspecified osteoarthritis, unspecified site: Secondary | ICD-10-CM | POA: Diagnosis present

## 2020-03-26 DIAGNOSIS — R739 Hyperglycemia, unspecified: Secondary | ICD-10-CM

## 2020-03-26 DIAGNOSIS — Z794 Long term (current) use of insulin: Secondary | ICD-10-CM | POA: Insufficient documentation

## 2020-03-26 DIAGNOSIS — E1165 Type 2 diabetes mellitus with hyperglycemia: Secondary | ICD-10-CM | POA: Diagnosis not present

## 2020-03-26 DIAGNOSIS — D649 Anemia, unspecified: Secondary | ICD-10-CM | POA: Diagnosis present

## 2020-03-26 DIAGNOSIS — E222 Syndrome of inappropriate secretion of antidiuretic hormone: Secondary | ICD-10-CM | POA: Diagnosis present

## 2020-03-26 DIAGNOSIS — J45909 Unspecified asthma, uncomplicated: Secondary | ICD-10-CM | POA: Diagnosis present

## 2020-03-26 DIAGNOSIS — E7800 Pure hypercholesterolemia, unspecified: Secondary | ICD-10-CM | POA: Diagnosis present

## 2020-03-26 DIAGNOSIS — Z87891 Personal history of nicotine dependence: Secondary | ICD-10-CM

## 2020-03-26 DIAGNOSIS — F329 Major depressive disorder, single episode, unspecified: Secondary | ICD-10-CM | POA: Diagnosis present

## 2020-03-26 DIAGNOSIS — Z20822 Contact with and (suspected) exposure to covid-19: Secondary | ICD-10-CM | POA: Diagnosis present

## 2020-03-26 DIAGNOSIS — F419 Anxiety disorder, unspecified: Secondary | ICD-10-CM | POA: Diagnosis present

## 2020-03-26 DIAGNOSIS — E871 Hypo-osmolality and hyponatremia: Secondary | ICD-10-CM

## 2020-03-26 DIAGNOSIS — I10 Essential (primary) hypertension: Secondary | ICD-10-CM | POA: Diagnosis present

## 2020-03-26 DIAGNOSIS — E785 Hyperlipidemia, unspecified: Secondary | ICD-10-CM | POA: Diagnosis present

## 2020-03-26 DIAGNOSIS — G43909 Migraine, unspecified, not intractable, without status migrainosus: Secondary | ICD-10-CM | POA: Diagnosis present

## 2020-03-26 DIAGNOSIS — F325 Major depressive disorder, single episode, in full remission: Secondary | ICD-10-CM

## 2020-03-26 DIAGNOSIS — K219 Gastro-esophageal reflux disease without esophagitis: Secondary | ICD-10-CM | POA: Diagnosis present

## 2020-03-26 DIAGNOSIS — R4182 Altered mental status, unspecified: Secondary | ICD-10-CM

## 2020-03-26 DIAGNOSIS — Z87440 Personal history of urinary (tract) infections: Secondary | ICD-10-CM

## 2020-03-26 DIAGNOSIS — T68XXXA Hypothermia, initial encounter: Secondary | ICD-10-CM | POA: Insufficient documentation

## 2020-03-26 LAB — COMPREHENSIVE METABOLIC PANEL, NON-FASTING
ALBUMIN: 3 g/dL — ABNORMAL LOW (ref 3.4–4.8)
ALKALINE PHOSPHATASE: 128 U/L (ref 55–145)
ALT (SGPT): 12 U/L (ref 8–22)
ANION GAP: 10 mmol/L (ref 4–13)
AST (SGOT): 24 U/L (ref 8–45)
BILIRUBIN TOTAL: 0.3 mg/dL (ref 0.3–1.3)
BUN/CREA RATIO: 15 (ref 6–22)
BUN: 17 mg/dL (ref 8–25)
CALCIUM: 8.5 mg/dL — ABNORMAL LOW (ref 8.8–10.2)
CHLORIDE: 100 mmol/L (ref 96–111)
CO2 TOTAL: 19 mmol/L — ABNORMAL LOW (ref 23–31)
CREATININE: 1.15 mg/dL — ABNORMAL HIGH (ref 0.60–1.05)
ESTIMATED GFR: 48 mL/min/BSA — ABNORMAL LOW (ref >=60)
GLUCOSE: 166 mg/dL — ABNORMAL HIGH (ref 65–125)
POTASSIUM: 4.4 mmol/L (ref 3.5–5.1)
PROTEIN TOTAL: 5.5 g/dL — ABNORMAL LOW (ref 6.0–8.0)
SODIUM: 129 mmol/L — ABNORMAL LOW (ref 136–145)

## 2020-03-26 LAB — CBC WITH DIFF
BASOPHIL #: <0.1 10*3/uL (ref <=0.20)
BASOPHIL %: 1 %
EOSINOPHIL #: <0.1 10*3/uL (ref <=0.50)
EOSINOPHIL %: 1 %
HCT: 36.6 % (ref 34.8–46.0)
HGB: 11.7 g/dL (ref 11.5–16.0)
IMMATURE GRANULOCYTE #: <0.1 10*3/uL (ref <0.10)
IMMATURE GRANULOCYTE %: 0 % (ref 0–1)
LYMPHOCYTE #: 0.88 10*3/uL — ABNORMAL LOW (ref 1.00–4.80)
LYMPHOCYTE %: 18 %
MCH: 30.2 pg (ref 26.0–32.0)
MCHC: 32 g/dL (ref 31.0–35.5)
MCV: 94.3 fL (ref 78.0–100.0)
MONOCYTE #: 0.33 10*3/uL (ref 0.20–1.10)
MONOCYTE %: 7 %
MPV: 10.9 fL (ref 8.7–12.5)
NEUTROPHIL #: 3.72 10*3/uL (ref 1.50–7.70)
NEUTROPHIL %: 73 %
PLATELETS: 169 10*3/uL (ref 150–400)
RBC: 3.88 10*6/uL (ref 3.85–5.22)
RDW-CV: 14.1 % (ref 11.5–15.5)
WBC: 5 10*3/uL (ref 3.7–11.0)

## 2020-03-26 LAB — URINALYSIS, MICROSCOPIC

## 2020-03-26 LAB — POC BLOOD GLUCOSE (RESULTS)
GLUCOSE, POC: 101 mg/dL (ref 60–110)
GLUCOSE, POC: 105 mg/dL (ref 60–110)
GLUCOSE, POC: 92 mg/dL (ref 60–110)
GLUCOSE, POC: 145 mg/dL — ABNORMAL HIGH (ref 60–110)
GLUCOSE, POC: 135 mg/dL — ABNORMAL HIGH (ref 60–110)
GLUCOSE, POC: 262 mg/dL — ABNORMAL HIGH (ref 60–110)

## 2020-03-26 LAB — URINALYSIS, MACROSCOPIC
BILIRUBIN: NEGATIVE mg/dL
BLOOD: NEGATIVE mg/dL
GLUCOSE: NEGATIVE mg/dL
KETONES: NEGATIVE mg/dL
LEUKOCYTES: NEGATIVE WBCs/uL
NITRITE: NEGATIVE
PH: 6 (ref 5.0–8.5)
PROTEIN: 100 mg/dL — AB
SPECIFIC GRAVITY: 1.02 (ref 1.005–1.030)
UROBILINOGEN: 0.2 mg/dL

## 2020-03-26 LAB — ECG 12-LEAD
Atrial Rate: 75 {beats}/min
Calculated P Axis: 53 degrees
Calculated R Axis: -19 degrees
Calculated T Axis: 33 degrees
PR Interval: 206 ms
QRS Duration: 90 ms
QT Interval: 450 ms
QTC Calculation: 502 ms
Ventricular rate: 75 {beats}/min

## 2020-03-26 LAB — GRAY TOP TUBE

## 2020-03-26 LAB — PT/INR
INR: 1.05 (ref 1.00–2.00)
PROTHROMBIN TIME: 10.8 s (ref 9.4–11.3)

## 2020-03-26 LAB — MAGNESIUM: MAGNESIUM: 1.9 mg/dL (ref 1.8–2.6)

## 2020-03-26 LAB — TROPONIN-I: TROPONIN I: 46 ng/L (ref 7–30)

## 2020-03-26 MED ORDER — AEROCHAMBER W/ FLOWSIGNAL SPACER
Freq: Once | Status: AC
Start: 2020-03-26 — End: 2020-03-27

## 2020-03-26 MED ORDER — INSULIN ASPART (U-100) 100 UNIT/ML (3 ML) SUBCUTANEOUS PEN
7.00 [IU] | PEN_INJECTOR | Freq: Two times a day (BID) | SUBCUTANEOUS | 3 refills | Status: DC
Start: 2020-03-26 — End: 2020-09-24

## 2020-03-26 MED ORDER — FUROSEMIDE 10 MG/ML INJECTION SOLUTION
40.00 mg | Freq: Two times a day (BID) | INTRAMUSCULAR | Status: DC
Start: 2020-03-26 — End: 2020-03-28
  Administered 2020-03-26 – 2020-03-28 (×5): 40 mg via INTRAVENOUS
  Filled 2020-03-26 (×5): qty 4

## 2020-03-26 MED ORDER — SODIUM CHLORIDE 0.9 % INTRAVENOUS PIGGYBACK
1.0000 g | INTRAVENOUS | Status: DC
Start: 2020-03-27 — End: 2020-03-28
  Administered 2020-03-27: 1 g via INTRAVENOUS
  Administered 2020-03-27 – 2020-03-28 (×2): 0 g via INTRAVENOUS
  Administered 2020-03-28: 08:00:00 1 g via INTRAVENOUS
  Filled 2020-03-26 (×2): qty 10

## 2020-03-26 MED ORDER — MAGNESIUM HYDROXIDE 400 MG/5 ML ORAL SUSPENSION
15.00 mL | Freq: Every day | ORAL | Status: DC | PRN
Start: 2020-03-26 — End: 2020-03-28

## 2020-03-26 MED ORDER — SODIUM CHLORIDE 1 GRAM TABLET
1.00 g | ORAL_TABLET | Freq: Three times a day (TID) | ORAL | Status: DC
Start: 2020-03-26 — End: 2020-03-28
  Administered 2020-03-26 – 2020-03-28 (×7): 1 g via ORAL
  Filled 2020-03-26 (×7): qty 1

## 2020-03-26 MED ORDER — CEFTRIAXONE 1 GRAM SOLUTION FOR INJECTION
1.00 g | INTRAMUSCULAR | Status: AC
Start: 2020-03-26 — End: 2020-03-26
  Administered 2020-03-26: 1 g via INTRAVENOUS
  Administered 2020-03-26: 10:00:00 0 g via INTRAVENOUS
  Filled 2020-03-26: qty 10

## 2020-03-26 MED ORDER — PANTOPRAZOLE 40 MG TABLET,DELAYED RELEASE
40.00 mg | DELAYED_RELEASE_TABLET | Freq: Every day | ORAL | Status: DC
Start: 2020-03-27 — End: 2020-03-28
  Administered 2020-03-27 – 2020-03-28 (×2): 40 mg via ORAL
  Filled 2020-03-26 (×2): qty 1

## 2020-03-26 MED ORDER — INSULIN LISPRO 100 UNIT/ML SUB-Q SSIP - RMH
0.00 [IU] | INJECTION | Freq: Four times a day (QID) | SUBCUTANEOUS | Status: DC | PRN
Start: 2020-03-26 — End: 2020-03-28
  Administered 2020-03-26: 6 [IU] via SUBCUTANEOUS
  Administered 2020-03-27 (×2): 4 [IU] via SUBCUTANEOUS
  Administered 2020-03-27 (×2): 6 [IU] via SUBCUTANEOUS
  Administered 2020-03-28: 2 [IU] via SUBCUTANEOUS
  Filled 2020-03-26: qty 3
  Filled 2020-03-26 (×3): qty 6
  Filled 2020-03-26: qty 3
  Filled 2020-03-26: qty 2

## 2020-03-26 MED ORDER — ACETAMINOPHEN 325 MG TABLET
650.00 mg | ORAL_TABLET | ORAL | Status: DC | PRN
Start: 2020-03-26 — End: 2020-03-28
  Administered 2020-03-27: 12:00:00 650 mg via ORAL

## 2020-03-26 MED ORDER — DILTIAZEM 30 MG TABLET
30.00 mg | ORAL_TABLET | Freq: Four times a day (QID) | ORAL | Status: DC
Start: 2020-03-26 — End: 2020-03-28
  Administered 2020-03-26 – 2020-03-28 (×9): 30 mg via ORAL
  Filled 2020-03-26 (×9): qty 1

## 2020-03-26 MED ORDER — ENOXAPARIN 40 MG/0.4 ML SUBCUTANEOUS SYRINGE
40.00 mg | INJECTION | SUBCUTANEOUS | Status: DC
Start: 2020-03-26 — End: 2020-03-27
  Administered 2020-03-26 – 2020-03-27 (×2): 40 mg via SUBCUTANEOUS
  Filled 2020-03-26 (×2): qty 0.4

## 2020-03-26 MED ORDER — LORATADINE 10 MG TABLET
10.00 mg | ORAL_TABLET | Freq: Every day | ORAL | Status: DC
Start: 2020-03-26 — End: 2020-03-28
  Administered 2020-03-26 – 2020-03-28 (×3): 10 mg via ORAL
  Filled 2020-03-26 (×3): qty 1

## 2020-03-26 MED ORDER — ONDANSETRON HCL (PF) 4 MG/2 ML INJECTION SOLUTION
4.00 mg | Freq: Four times a day (QID) | INTRAMUSCULAR | Status: DC | PRN
Start: 2020-03-26 — End: 2020-03-28

## 2020-03-26 MED ORDER — ATORVASTATIN 10 MG TABLET
10.00 mg | ORAL_TABLET | Freq: Every evening | ORAL | Status: DC
Start: 2020-03-26 — End: 2020-03-28
  Administered 2020-03-26 – 2020-03-27 (×2): 10 mg via ORAL
  Filled 2020-03-26 (×2): qty 1

## 2020-03-26 MED ORDER — HYDROCODONE 5 MG-ACETAMINOPHEN 325 MG TABLET
1.00 | ORAL_TABLET | ORAL | Status: DC | PRN
Start: 2020-03-26 — End: 2020-03-28
  Administered 2020-03-26 – 2020-03-27 (×4): 1 via ORAL
  Filled 2020-03-26 (×4): qty 1

## 2020-03-26 MED ORDER — DEXTROSE 50 % IN WATER (D50W) INTRAVENOUS SYRINGE
12.50 g | INJECTION | INTRAVENOUS | Status: DC | PRN
Start: 2020-03-26 — End: 2020-03-28

## 2020-03-26 MED ORDER — LORAZEPAM 0.5 MG TABLET
0.50 mg | ORAL_TABLET | Freq: Every evening | ORAL | Status: DC
Start: 2020-03-26 — End: 2020-03-28
  Administered 2020-03-26 – 2020-03-27 (×2): 0.5 mg via ORAL
  Filled 2020-03-26 (×2): qty 1

## 2020-03-26 MED ORDER — LINEZOLID IN 5% DEXTROSE IN WATER 600 MG/300 ML INTRAVENOUS PIGGYBACK
600.0000 mg | INJECTION | INTRAVENOUS | Status: AC
Start: 2020-03-26 — End: 2020-03-26
  Administered 2020-03-26: 600 mg via INTRAVENOUS
  Administered 2020-03-26: 0 mg via INTRAVENOUS
  Filled 2020-03-26: qty 300

## 2020-03-26 MED ORDER — SODIUM CHLORIDE 0.9 % (FLUSH) INJECTION SYRINGE
10.00 mL | INJECTION | Freq: Three times a day (TID) | INTRAMUSCULAR | Status: DC
Start: 2020-03-26 — End: 2020-03-28
  Administered 2020-03-26 – 2020-03-28 (×7): 0 mL

## 2020-03-26 MED ORDER — SODIUM CHLORIDE 0.9 % INTRAVENOUS SOLUTION
INTRAVENOUS | Status: DC
Start: 2020-03-26 — End: 2020-03-27

## 2020-03-26 MED ORDER — LIPASE-PROTEASE-AMYLASE 5,000-17,000-24,000 UNIT CAPSULE, DELAYED REL
3.00 | DELAYED_RELEASE_CAPSULE | Freq: Three times a day (TID) | ORAL | Status: DC
Start: 2020-03-26 — End: 2020-03-28
  Administered 2020-03-26 – 2020-03-28 (×7): 3 via ORAL
  Filled 2020-03-26 (×7): qty 3

## 2020-03-26 MED ORDER — ASPIRIN 81 MG TABLET,DELAYED RELEASE
81.00 mg | DELAYED_RELEASE_TABLET | Freq: Every day | ORAL | Status: DC
Start: 2020-03-26 — End: 2020-03-28
  Administered 2020-03-26 – 2020-03-28 (×3): 81 mg via ORAL
  Filled 2020-03-26 (×3): qty 1

## 2020-03-26 MED ORDER — LINEZOLID IN 5% DEXTROSE IN WATER 600 MG/300 ML INTRAVENOUS PIGGYBACK
600.0000 mg | INJECTION | Freq: Two times a day (BID) | INTRAVENOUS | Status: DC
Start: 2020-03-26 — End: 2020-03-28
  Administered 2020-03-26: 600 mg via INTRAVENOUS
  Administered 2020-03-26: 0 mg via INTRAVENOUS
  Administered 2020-03-27 (×2): 600 mg via INTRAVENOUS
  Administered 2020-03-27 – 2020-03-28 (×4): 0 mg via INTRAVENOUS
  Administered 2020-03-28: 600 mg via INTRAVENOUS
  Filled 2020-03-26 (×4): qty 300

## 2020-03-26 MED ORDER — SODIUM CHLORIDE 0.9 % (FLUSH) INJECTION SYRINGE
10.00 mL | INJECTION | INTRAMUSCULAR | Status: DC | PRN
Start: 2020-03-26 — End: 2020-03-28
  Administered 2020-03-28: 10 mL

## 2020-03-26 MED ORDER — METOPROLOL SUCCINATE ER 50 MG TABLET,EXTENDED RELEASE 24 HR
50.00 mg | ORAL_TABLET | Freq: Every day | ORAL | Status: DC
Start: 2020-03-26 — End: 2020-03-28
  Administered 2020-03-26 – 2020-03-28 (×3): 50 mg via ORAL
  Filled 2020-03-26 (×3): qty 1

## 2020-03-26 MED ORDER — TIOTROPIUM BROMIDE 2.5 MCG/ACTUATION MIST FOR INHALATION
2.00 | Freq: Every day | RESPIRATORY_TRACT | Status: DC
Start: 2020-03-27 — End: 2020-03-28
  Administered 2020-03-27 – 2020-03-28 (×2): 2 via RESPIRATORY_TRACT
  Filled 2020-03-26: qty 4

## 2020-03-26 MED ORDER — LOSARTAN 50 MG TABLET
100.00 mg | ORAL_TABLET | Freq: Every day | ORAL | Status: DC
Start: 2020-03-27 — End: 2020-03-28
  Administered 2020-03-27 – 2020-03-28 (×2): 100 mg via ORAL
  Filled 2020-03-26 (×2): qty 2

## 2020-03-26 NOTE — Respiratory Therapy (Signed)
Patient was ordered respiratory protocol , patient was evaluated, ordered Spiriva once  daily. 02 sat 96% on room air.

## 2020-03-26 NOTE — ED Nurses Note (Signed)
Bear Hugger applied to patient, provider made aware.  Temp set at 43 degrees celsius.

## 2020-03-26 NOTE — Telephone Encounter (Signed)
[facility] social worker called with update    She will update her needs and call with orders    She reports patient is somewhat depressed but not with any self harm or harm to others    Patient reports some pain in her legs and other general pains - but nothing new    Patient will make appt as needed with Dr Karmen Bongo for further orders.    Lattie Haw will stay in touch with office for patients care     Lucretia Field, RN

## 2020-03-26 NOTE — Care Plan (Signed)
Problem: Adjustment to Illness (Targeted Temperature Management)  Goal: Optimal Response to Life-Threatening Event  Outcome: Ongoing (see interventions/notes)     Problem: Body Temperature Regulation (Targeted Temperature Management)  Goal: Target Body Temperature Maintained  Outcome: Ongoing (see interventions/notes)     Problem: Dysrhythmia (Targeted Temperature Management)  Goal: Stable Cardiac Rate and Rhythm  Outcome: Ongoing (see interventions/notes)     Problem: Hemodynamic Instability (Targeted Temperature Management)  Goal: Effective Tissue Perfusion  Outcome: Ongoing (see interventions/notes)     Problem: Infection (Targeted Temperature Management)  Goal: Absence of Infection Signs and Symptoms  Outcome: Ongoing (see interventions/notes)   Perform frequent monitoring (e.g., temperature, vital signs, hemodynamics, electrocardiogram, shivering scale); use 2 sources of core temperature measurement (e.g., pulmonary artery catheter, bladder, rectal, esophageal).

## 2020-03-26 NOTE — ED Provider Notes (Signed)
Emergency Medicine  Attending Only Note    Name: [name]  Age and Gender: 74 y.o. female  Date of Birth: [birth year]  Date of Service: 03/26/2020  MRN: [id number]  PCP: [name] Buddy, MD    CC:  Chief Complaint   Patient presents with    Hypoglycemia- Symptomatic     FS 62 per ems, altered mental status, 20 G PIV R Wrist 1 amp D50 provided.        HPI:  [name] is a 74 y.o. White female with history of DM type 2 insulin dependent, COPD, recurrent UTIs who presents with altered mental status and hypoglycemia.  On arrival, patient is oriented to person, does not remember this morning events otherwise.  Per EMS, when they arrived on scene, patient was unresponsive with a fingerstick in the 60s, was given an amp of D50 and awoke, but still altered similar to on arrival.  She is following commands, can answer questions about how she feels currently.  Denies any significant symptoms other than feeling cold, no chest pain, dyspnea, abdominal pain.  EMS noted that she had an ED visit a couple of days ago for a similar episode and was possibly diagnosed with a UTI at that time.    Per chart review, patient had hypoglycemic episode with unresponsiveness and was seen about the same time of morning on 03/23/2020.  The note mentions she is insulin-dependent.  It is unclear if the patient took her insulin today.  Was not diagnosed with a UTI during that ED visit, however was placed on prophylactic Bactrim by her PCP, Dr. Reyne [name], over the last couple of days.    ROS:  Constitutional:  Positive chills, feeling cold.  Positive for altered mental status.  No fever or weakness.   Skin: No rash or diaphoresis.  HENT: No headaches, or congestion.  Eyes: No vision changes or photophobia.  Cardio: No chest pain, palpitations or leg swelling.  Respiratory: No cough, wheezing or SOB.  GI:  No nausea, vomiting or stool changes.  GU:  No dysuria, hematuria, or increased frequency.  MSK: No muscle aches, joint or back  pain.  Neuro: No seizures, LOC, numbness, tingling, or focal weakness.  Psychiatric: No depression, SI or illicit substance abuse.    All other systems reviewed and are negative.    Below pertinent information reviewed with patient:  Past Medical History:   Diagnosis Date    Abdominal hernia 10/18/2017    hx of repair    Anxiety     Arthritis     Asthma     Atrial fibrillation (CMS HCC)     Back problem     Bruises easily     Cancer (CMS [place]) 10/18/2017    chemo and radiation completed 09/24/2017    COPD (chronic obstructive pulmonary disease) (CMS HCC)     CPAP (continuous positive airway pressure) dependence 10/18/2017    has not used recently    Depression     DM (diabetes mellitus) (CMS [place]) 2000    Fasting BG 200's. HGA1C 2018 6    Edema     Essential hypertension     GERD (gastroesophageal reflux disease)     Headache     Heartburn     Hx antineoplastic chemo 2018    Hx of radiation therapy 2018    Hypercholesteremia     Hyperlipidemia     Migraine 10/18/2017    none recently    Obesity  Palpitations     Pancreatic cancer (CMS [place]) 03/2017    Panic attack     PE (pulmonary thromboembolism) (CMS HCC) 03/29/2018    Peripheral neuropathy 10/18/2017    knees down, bilateral    Sepsis (CMS [place]) 04/15/2019    Sleep apnea     Type 2 diabetes mellitus (CMS [place]) 10/18/2017    Dx 2000 FBS 200s    Unintentional weight loss 10/18/2017    25 lbs 03/2017    UTI (urinary tract infection)     Wears glasses            Medications Prior to Admission     Prescriptions    Acetaminophen-Butalbital 50-325 mg Oral Tablet    Take by mouth    aspirin (ECOTRIN) 81 mg Oral Tablet, Delayed Release (E.C.)    Take 1 Tab (81 mg total) by mouth Once a day    atorvastatin (LIPITOR) 10 mg Oral Tablet    TAKE 1 TABLET BY MOUTH ONCE DAILY    cetirizine (ZYRTEC) 10 mg Oral Tablet    Take 1 Tab (10 mg total) by mouth Once a day    dilTIAZem (CARDIZEM) 30 mg Oral Tablet    Take 1 Tablet (30 mg total) by mouth Four  times a day for 30 days    furosemide (LASIX) 80 mg Oral Tablet    Take 1 Tablet (80 mg total) by mouth Once a day for 90 days    hydrALAZINE (APRESOLINE) 50 mg Oral Tablet    Take 0.5 Tablets (25 mg total) by mouth Twice daily for 30 days    HYDROcodone-acetaminophen (NORCO) 7.5-325 mg Oral Tablet    TAKE 1 TABLET BY MOUTH EVERY 6 HOURS AS NEEDED FOR PAIN    insulin aspart U-100 (NOVOLOG FLEXPEN U-100 INSULIN) 100 unit/mL (3 mL) Subcutaneous Insulin Pen    7 Units by Subcutaneous route Twice a day before meals    insulin glargine (LANTUS SOLOSTAR U-100 INSULIN) 100 unit/mL Subcutaneous Insulin Pen    10 Units by Subcutaneous route Every night    lidocaine HCL (XYLOCAINE) 2 % Mucous Membrane Solution    5 mL    LORazepam (ATIVAN) 0.5 mg Oral Tablet    Take 1 Tablet (0.5 mg total) by mouth Every evening    losartan (COZAAR) 100 mg Oral Tablet    Take 1 Tablet (100 mg total) by mouth Once a day for 90 days    magnesium oxide (MAG-OX) 400 mg Oral Tablet    Take 1 Tablet (400 mg total) by mouth Twice daily for 30 days    methylnaltrexone (RELISTOR) 150 mg Oral Tablet    Relistor 150 mg tablet  TAKE 3 TABLETS BY MOUTH DAILY    metoprolol succinate (TOPROL-XL) 50 mg Oral Tablet Sustained Release 24 hr    Take 1 Tablet (50 mg total) by mouth Once a day    NANO PEN NEEDLE 32 gauge x 5/32" Needle    USE WITH INSULIN FLEXPEN BID AS NEEDED    omeprazole (PRILOSEC) 40 mg Oral Capsule, Delayed Release(E.C.)    Take 1 Cap (40 mg total) by mouth Once a day    pancreatic enzyme replacement (CREON) 12,000-38,000 -60,000 unit Oral Capsule, Delayed Release(E.C.)    Take 2 Capsules by mouth Three times daily with meals 2 CAPS WITH MEALS AND 1 CAP WITH EACH SNACK    sodium chloride 1 gram Oral Tablet    Take 1 Tablet (1 g total) by mouth Three times daily with  meals for 30 days    tiotropium bromide (SPIRIVA HANDIHALER) 18 mcg Inhalation Capsule, w/Inhalation Device    Take 1 Capsule (18 mcg total) by inhalation Once a day for 180 days     trimethoprim (TRIMPEX) 100 mg Oral Tablet    Take 1 Tablet (100 mg total) by mouth Twice daily for 30 days          Allergies   Allergen Reactions    Sulfa (Sulfonamides) Itching       Past Surgical History:   Procedure Laterality Date    CESAREAN SECTION      COLONOSCOPY  10/04/2019    Diverticuli a colonoscopy by Dr. Tonye Becket    HX CESAREAN SECTION      HX CHOLECYSTECTOMY      HX HERNIA REPAIR      HX SUBCLAVIAN PORT IMPLANTION      s/p removed    HX SUBCLAVIAN PORT IMPLANTION      HX TONSIL AND ADENOIDECTOMY      HX TONSILLECTOMY      PANCREATICODUODENECTOMY  99991111    UMBILICAL HERNIA REPAIR      x2    WHIPPLE PROCEDURE W/ LAPAROSCOPY             Family History   Problem Relation Age of Onset    Heart Disease Mother     Diabetes Mother     Heart Attack Mother 15    Other Mother 77        Bypass surgery    Respiratory Problems Father     Alzheimer's/Dementia Father 98    Lung disease Father         Black lung, lung removal    Cancer Sister     Pacemaker Sister     Heart Disease Brother     Hypertension (High Blood Pressure) Other        Social History     Tobacco Use    Smoking status: Former Smoker     Packs/day: 0.50     Years: 20.00     Pack years: 10.00     Types: Cigarettes    Smokeless tobacco: Never Used    Tobacco comment: unsure of quit date   Substance Use Topics    Alcohol use: Never       Objective:    ED Triage Vitals   BP (Non-Invasive) 03/26/20 0754 (!) 203/88   Pulse --    Resp --    Temperature 03/26/20 0750 (!) 33.4 C (92.1 F)   SpO2 --    Weight --    Height --      Filed Vitals:    03/26/20 0800 03/26/20 0815 03/26/20 0830 03/26/20 0901   BP: (!) 183/59 (!) 167/78 (!) 169/48    Pulse: 84 66 66    Resp: 20 15 19     Temp:    (!) 34.6 C (94.2 F)   SpO2: 100% 100% 96%          Nursing notes and vital signs reviewed.    Constitutional - sleepy, easily awakens to voice, shivering.  No acute distress.    HEENT - Normocephalic. Atraumatic. PERRL. EOMI. Conjunctiva  clear. Oropharynx with no erythema, lesions, or exudates. Moist mucous membranes.   Neck - Trachea midline. No stridor. No hoarseness.  Cardiac - Regular rate and rhythm. No murmurs, rubs, or gallops.  Respiratory - Clear to auscultation bilaterally. No rales, wheezes or rhonchi.  Abdomen - Non-tender, soft, non-distended. No rebound  or guarding.   Musculoskeletal - bilateral lower extremity pitting edema up through distal thighs.  Good AROM. No muscle or joint tenderness appreciated. No clubbing, cyanosis.  Skin - cool and dry, without any rashes or other lesions.  Neuro - Cranial nerves II-XII are grossly intact.  Moving all extremities symmetrically.  Psych - Alert and oriented. Answers questions appropriately.     Any pertinent labs and imaging obtained during this encounter reviewed below in MDM.    MDM/ED Course:  Presents after hypoglycemic episode and given amp of D50 for this, reportedly unresponsive on scene, currently sleepy/somnolent on arrival, is following commands and seems to answer questions appropriately, however does not remember the events from this morning.  No focal neurologic deficits.  On chart review, similar episode that she had an ED visit for 3 days ago.  Notably hypothermic on arrival.  I suspect that hypothermia is secondary to her hypoglycemic episode, however differential would include sepsis most likely from UTI or other occult infection.    Labs overall unrevealing, has mild hyponatremia however this is unchanged from recent values, creatinine of 1.15 which appears just outside her upper range of normal.  No leukocytosis.  Urine shows occasional bacteria, 3-5 wbc's.  On chart review, had similar appearing urine on her last ED visit 3 days ago.  She is known to have recurrent UTIs, on chart review, her PCP started her on Bactrim for UTI prophylaxis over the last couple days.  She did have a non identified Gram-positive organism about 1 month ago and prior VRE.  Concerned she may have  a mild UTI which predispose her to hypoglycemia although no other specific signs of sepsis currently.    Do feel that with her hypothermia, 2nd episode of significant hypoglycemia with altered mental status over the last couple of days she warrants IV antibiotics and inpatient admission.    Critical Care Time:  Total critical care time spent in direct care of this patient at high risk based on presenting history/exam/and complaint, including initial evaluation and stabilization, review of data, re-examination, discussion with admitting and consulting services to arrange definitive care, and exclusive of any procedures performed, was 42 minutes.         Orders Placed This Encounter    ADULT ROUTINE BLOOD CULTURE, SET OF 2 BOTTLES (BACTERIA AND YEAST)    ADULT ROUTINE BLOOD CULTURE, SET OF 2 BOTTLES (BACTERIA AND YEAST)    URINE CULTURE,ROUTINE    XR AP MOBILE CHEST    CBC/DIFF    COMPREHENSIVE METABOLIC PANEL, NON-FASTING    MAGNESIUM    PT/INR    TROPONIN-I    URINALYSIS, MACROSCOPIC AND MICROSCOPIC    CBC WITH DIFF    URINALYSIS, MACROSCOPIC    URINALYSIS, MICROSCOPIC    EXTRA TUBES    GRAY TOP TUBE    COVID-19 - SCREENING - Admission (NON-PUI)    ECG 12-LEAD    cefTRIAXone (ROCEPHIN) 1 g in NS 100 mL IVPB    linezolid (ZYVOX) 600 mg in iso-osmotic 300 mL premix IVPB       Any procedures:  Procedures    Impression:   Encounter Diagnoses   Name Primary?    Altered mental status, unspecified altered mental status type Yes    Hypothermia, initial encounter     Hypoglycemia     Insulin dependent type 2 diabetes mellitus (CMS [place])          Disposition: Admitted    Portions of this note may have been  dictated using voice recognition software.     Gates Rigg, MD  [facility] Department of Emergency Medicine    -----------------------  Results for orders placed or performed during the hospital encounter of 03/26/20 (from the past 12 hour(s))   POC BLOOD GLUCOSE (RESULTS)   Result  Value Ref Range    GLUCOSE, POC 101 60 - 110 mg/dl   URINALYSIS, MACROSCOPIC   Result Value Ref Range    COLOR Yellow Yellow, Straw    APPEARANCE Clear Clear, Slightly Hazy    SPECIFIC GRAVITY 1.020 1.005 - 1.030    PH 6.0 5.0 - 8.5    LEUKOCYTES Negative Negative WBCs/uL    NITRITE Negative Negative    PROTEIN 100  (A) Negative mg/dL    GLUCOSE Negative Negative mg/dL    KETONES Negative Negative mg/dL    UROBILINOGEN 0.2  0.2 mg/dL    BILIRUBIN Negative Negative mg/dL    BLOOD Negative Negative mg/dL   URINALYSIS, MICROSCOPIC   Result Value Ref Range    RBCS Occasional None, Occasional, 0-2, 3-5 /hpf    WBCS 3-5 None, Occasional, 0-2, 3-5 /hpf    BACTERIA Occasional (A) None /hpf    SQUAMOUS EPITHELIAL 0-2 None, Occasional, 0-2, 3-5 /hpf    RENAL EPITHELIAL Occasional None, Occasional, 0-2, 3-5 /hpf    AMORPHOUS SEDIMENT Few (A) None /hpf   COMPREHENSIVE METABOLIC PANEL, NON-FASTING   Result Value Ref Range    SODIUM 129 (L) 136 - 145 mmol/L    POTASSIUM 4.4 3.5 - 5.1 mmol/L    CHLORIDE 100 96 - 111 mmol/L    CO2 TOTAL 19 (L) 23 - 31 mmol/L    ANION GAP 10 4 - 13 mmol/L    BUN 17 8 - 25 mg/dL    CREATININE 1.15 (H) 0.60 - 1.05 mg/dL    BUN/CREA RATIO 15 6 - 22    ESTIMATED GFR 48 (L) >=60 mL/min/BSA    ALBUMIN 3.0 (L) 3.4 - 4.8 g/dL     CALCIUM 8.5 (L) 8.8 - 10.2 mg/dL    GLUCOSE 166 (H) 65 - 125 mg/dL    ALKALINE PHOSPHATASE 128 55 - 145 U/L    ALT (SGPT) 12 8 - 22 U/L    AST (SGOT)  24 8 - 45 U/L    BILIRUBIN TOTAL 0.3 0.3 - 1.3 mg/dL    PROTEIN TOTAL 5.5 (L) 6.0 - 8.0 g/dL   MAGNESIUM   Result Value Ref Range    MAGNESIUM 1.9 1.8 - 2.6 mg/dL   PT/INR   Result Value Ref Range    PROTHROMBIN TIME 10.8 9.4 - 11.3 seconds    INR 1.05 1.00 - 2.00   CBC WITH DIFF   Result Value Ref Range    WBC 5.0 3.7 - 11.0 x103/uL    RBC 3.88 3.85 - 5.22 x106/uL    HGB 11.7 11.5 - 16.0 g/dL    HCT 36.6 34.8 - 46.0 %    MCV 94.3 78.0 - 100.0 fL    MCH 30.2 26.0 - 32.0 pg    MCHC 32.0 31.0 - 35.5 g/dL    RDW-CV 14.1 11.5 - 15.5 %      PLATELETS 169 150 - 400 x103/uL    MPV 10.9 8.7 - 12.5 fL    NEUTROPHIL % 73 %    LYMPHOCYTE % 18 %    MONOCYTE % 7 %    EOSINOPHIL % 1 %    BASOPHIL % 1 %  NEUTROPHIL # 3.72 1.50 - 7.70 x103/uL    LYMPHOCYTE # 0.88 (L) 1.00 - 4.80 x103/uL    MONOCYTE # 0.33 0.20 - 1.10 x103/uL    EOSINOPHIL # <0.10 <=0.50 x103/uL    BASOPHIL # <0.10 <=0.20 x103/uL    IMMATURE GRANULOCYTE % 0 0 - 1 %    IMMATURE GRANULOCYTE # <0.10 <0.10 x103/uL   POC BLOOD GLUCOSE (RESULTS)   Result Value Ref Range    GLUCOSE, POC 105 60 - 110 mg/dl   POC BLOOD GLUCOSE (RESULTS)   Result Value Ref Range    GLUCOSE, POC 92 60 - 110 mg/dl     XR AP MOBILE CHEST   ED Interpretation   Tunneled line to right chest, no obvious acute findings

## 2020-03-26 NOTE — H&P (Signed)
[facility]  Hospitalist  History & Physical    Date of Service:  03/26/2020  [name], [name], 74 y.o. female  Date of Admission:  03/26/2020  Date of Birth:  August 14, 1946  PCP: Myrene Buddy, MD    Chief Complaint:  Hypoglycemia/hypothermia    HPI:  [name] is a 74 y.o. White female who is admitted for hypoglycemia and hypothermia.  Patient is type 2 diabetic with insulin dependent and has a history of recurrent UTIs will she presented emergency room this morning with altered mental status and hypoglycemia as well as hypothermia.  Patient was home last evening and blood sugar last night was 196.  The patient did not take short-acting insulin but did receive her long-acting insulin at 10 units subcu.  This morning the patient's son found her in bed with decreased level of consciousness with no blankets on.  EMS was called she is found to have a blood sugar of 60. She received D50 and became more alert.  On arrival to the emergency room the patient's level of alertness was still somewhat altered.  She is slow to answer questions.  Her temples found to be 92 degrees Fahrenheit.  Patient had a warming blanket placed and her temperature increased to 94 degrees Fahrenheit by the time I was in the emergency room.  She has a history of VRE with recurrent urinary tract infections.  She started on Rocephin and linezolid in the emergency department.  Urine culture was obtained.    Past Medical History:   Diagnosis Date    Abdominal hernia 10/18/2017    hx of repair    Anxiety     Arthritis     Asthma     Atrial fibrillation (CMS HCC)     Back problem     Bruises easily     Cancer (CMS [place]) 10/18/2017    chemo and radiation completed 09/24/2017    COPD (chronic obstructive pulmonary disease) (CMS HCC)     CPAP (continuous positive airway pressure) dependence 10/18/2017    has not used recently    Depression     DM (diabetes mellitus) (CMS [place]) 2000    Fasting BG 200's. HGA1C 2018 6     Edema     Essential hypertension     GERD (gastroesophageal reflux disease)     Headache     Heartburn     Hx antineoplastic chemo 2018    Hx of radiation therapy 2018    Hypercholesteremia     Hyperlipidemia     Migraine 10/18/2017    none recently    Obesity     Palpitations     Pancreatic cancer (CMS [place]) 03/2017    Panic attack     PE (pulmonary thromboembolism) (CMS HCC) 03/29/2018    Peripheral neuropathy 10/18/2017    knees down, bilateral    Sepsis (CMS [place]) 04/15/2019    Sleep apnea     Type 2 diabetes mellitus (CMS [place]) 10/18/2017    Dx 2000 FBS 200s    Unintentional weight loss 10/18/2017    25 lbs 03/2017    UTI (urinary tract infection)     Wears glasses       Past Surgical History:   Procedure Laterality Date    CESAREAN SECTION      COLONOSCOPY  10/04/2019    Diverticuli a colonoscopy by Dr. Tonye Becket    HX CESAREAN SECTION      HX CHOLECYSTECTOMY  HX HERNIA REPAIR      HX SUBCLAVIAN PORT IMPLANTION      s/p removed    HX SUBCLAVIAN PORT IMPLANTION      HX TONSIL AND ADENOIDECTOMY      HX TONSILLECTOMY      PANCREATICODUODENECTOMY  99991111    UMBILICAL HERNIA REPAIR      x2    WHIPPLE PROCEDURE W/ LAPAROSCOPY        Social History     Tobacco Use    Smoking status: Former Smoker     Packs/day: 0.50     Years: 20.00     Pack years: 10.00     Types: Cigarettes    Smokeless tobacco: Never Used    Tobacco comment: unsure of quit date   Vaping Use    Vaping Use: Never used   Substance Use Topics    Alcohol use: Never    Drug use: Never       Family Medical History:     Problem Relation (Age of Onset)    Alzheimer's/Dementia Father (73)    Cancer Sister    Diabetes Mother    Heart Attack Mother (60)    Heart Disease Mother, Brother    Hypertension (High Blood Pressure) Other    Lung disease Father    Other Mother (49)    Pacemaker Sister    Respiratory Problems Father         Medications Prior to Admission     Prescriptions    Acetaminophen-Butalbital 50-325 mg Oral  Tablet    Take by mouth    aspirin (ECOTRIN) 81 mg Oral Tablet, Delayed Release (E.C.)    Take 1 Tab (81 mg total) by mouth Once a day    atorvastatin (LIPITOR) 10 mg Oral Tablet    TAKE 1 TABLET BY MOUTH ONCE DAILY    cetirizine (ZYRTEC) 10 mg Oral Tablet    Take 1 Tab (10 mg total) by mouth Once a day    dilTIAZem (CARDIZEM) 30 mg Oral Tablet    Take 1 Tablet (30 mg total) by mouth Four times a day for 30 days    furosemide (LASIX) 80 mg Oral Tablet    Take 1 Tablet (80 mg total) by mouth Once a day for 90 days    hydrALAZINE (APRESOLINE) 50 mg Oral Tablet    Take 0.5 Tablets (25 mg total) by mouth Twice daily for 30 days    HYDROcodone-acetaminophen (NORCO) 7.5-325 mg Oral Tablet    TAKE 1 TABLET BY MOUTH EVERY 6 HOURS AS NEEDED FOR PAIN    insulin aspart U-100 (NOVOLOG FLEXPEN U-100 INSULIN) 100 unit/mL (3 mL) Subcutaneous Insulin Pen    7 Units by Subcutaneous route Twice a day before meals    insulin glargine (LANTUS SOLOSTAR U-100 INSULIN) 100 unit/mL Subcutaneous Insulin Pen    10 Units by Subcutaneous route Every night    lidocaine HCL (XYLOCAINE) 2 % Mucous Membrane Solution    5 mL    LORazepam (ATIVAN) 0.5 mg Oral Tablet    Take 1 Tablet (0.5 mg total) by mouth Every evening    losartan (COZAAR) 100 mg Oral Tablet    Take 1 Tablet (100 mg total) by mouth Once a day for 90 days    magnesium oxide (MAG-OX) 400 mg Oral Tablet    Take 1 Tablet (400 mg total) by mouth Twice daily for 30 days    methylnaltrexone (RELISTOR) 150 mg Oral Tablet    Relistor 150 mg  tablet  TAKE 3 TABLETS BY MOUTH DAILY    metoprolol succinate (TOPROL-XL) 50 mg Oral Tablet Sustained Release 24 hr    Take 1 Tablet (50 mg total) by mouth Once a day    NANO PEN NEEDLE 32 gauge x 5/32" Needle    USE WITH INSULIN FLEXPEN BID AS NEEDED    omeprazole (PRILOSEC) 40 mg Oral Capsule, Delayed Release(E.C.)    Take 1 Cap (40 mg total) by mouth Once a day    pancreatic enzyme replacement (CREON) 12,000-38,000 -60,000 unit Oral Capsule, Delayed  Release(E.C.)    Take 2 Capsules by mouth Three times daily with meals 2 CAPS WITH MEALS AND 1 CAP WITH EACH SNACK    sodium chloride 1 gram Oral Tablet    Take 1 Tablet (1 g total) by mouth Three times daily with meals for 30 days    tiotropium bromide (SPIRIVA HANDIHALER) 18 mcg Inhalation Capsule, w/Inhalation Device    Take 1 Capsule (18 mcg total) by inhalation Once a day for 180 days    trimethoprim (TRIMPEX) 100 mg Oral Tablet    Take 1 Tablet (100 mg total) by mouth Twice daily for 30 days         Allergies   Allergen Reactions    Sulfa (Sulfonamides) Itching        Review of Systems   Constitutional: Positive for chills. Negative for diaphoresis, fever, malaise/fatigue and weight loss.   HENT: Negative for congestion, ear pain, hearing loss, nosebleeds, sinus pain, sore throat and tinnitus.    Eyes: Negative for blurred vision, double vision, photophobia and pain.   Respiratory: Negative for cough, hemoptysis, sputum production, shortness of breath and wheezing.    Cardiovascular: Negative for chest pain, palpitations, orthopnea, claudication, leg swelling and PND.   Gastrointestinal: Negative for abdominal pain, blood in stool, constipation, diarrhea, heartburn, melena, nausea and vomiting.   Genitourinary: Negative for dysuria, frequency and hematuria.   Musculoskeletal: Negative for falls, joint pain and myalgias.   Skin: Negative for itching and rash.   Neurological: Negative for dizziness, tingling, tremors, sensory change, speech change, focal weakness, seizures, loss of consciousness and headaches.   Psychiatric/Behavioral: Negative for depression, hallucinations and suicidal ideas. The patient is not nervous/anxious.           Filed Vitals:    03/26/20 0845 03/26/20 0901 03/26/20 0915 03/26/20 0930   BP: (!) 156/85  (!) 165/51 (!) 162/75   Pulse: 70  67 66   Resp: 18  19 14    Temp:  (!) 34.6 C (94.2 F)     SpO2: 99%  100% 98%       Physical Exam  Constitutional:       Comments: Patient was seen  in the emergency department in ER room seven, she has a warming blanket on in this time, the patient is alert to person and place, she has no memory of coming to the emergency room.  Patient does answer questions appropriately but is slow to answer and time simply states I do not remember   HENT:      Head: Normocephalic and atraumatic.   Eyes:      Conjunctiva/sclera: Conjunctivae normal.      Pupils: Pupils are equal, round, and reactive to light.   Neck:      Thyroid: No thyromegaly.      Vascular: No JVD.   Cardiovascular:      Rate and Rhythm: Normal rate and regular rhythm.  Heart sounds: Normal heart sounds. No murmur heard.   No friction rub. No gallop.    Pulmonary:      Effort: No respiratory distress.      Breath sounds: Normal breath sounds. No wheezing or rales.   Chest:      Chest wall: No tenderness.   Abdominal:      General: Bowel sounds are normal. There is no distension.      Palpations: Abdomen is soft. There is no mass.      Tenderness: There is no abdominal tenderness. There is no guarding or rebound.   Musculoskeletal:         General: No tenderness or deformity. Normal range of motion.      Cervical back: Normal range of motion and neck supple.      Right lower leg: Edema present.      Left lower leg: Edema present.      Comments: 2 to 3+ edema lower extremities noted bilaterally   Lymphadenopathy:      Cervical: No cervical adenopathy.   Skin:     General: Skin is warm and dry.      Coloration: Skin is not pale.      Findings: No rash.   Neurological:      Mental Status: She is alert.      Cranial Nerves: No cranial nerve deficit.      Motor: No abnormal muscle tone.      Coordination: Coordination normal.      Deep Tendon Reflexes: Reflexes are normal and symmetric.      Comments: Patient is somewhat slow to answer this morning, she does answer questions but is slow to respond and at times will simply say I do not remember   Psychiatric:         Mood and Affect: Mood and affect  normal.         Cognition and Memory: Memory normal.          Laboratory Data:     Results for orders placed or performed during the hospital encounter of 03/26/20 (from the past 24 hour(s))   POC BLOOD GLUCOSE (RESULTS)   Result Value Ref Range    GLUCOSE, POC 101 60 - 110 mg/dl   URINALYSIS, MACROSCOPIC   Result Value Ref Range    COLOR Yellow Yellow, Straw    APPEARANCE Clear Clear, Slightly Hazy    SPECIFIC GRAVITY 1.020 1.005 - 1.030    PH 6.0 5.0 - 8.5    LEUKOCYTES Negative Negative WBCs/uL    NITRITE Negative Negative    PROTEIN 100  (A) Negative mg/dL    GLUCOSE Negative Negative mg/dL    KETONES Negative Negative mg/dL    UROBILINOGEN 0.2  0.2 mg/dL    BILIRUBIN Negative Negative mg/dL    BLOOD Negative Negative mg/dL   URINALYSIS, MICROSCOPIC   Result Value Ref Range    RBCS Occasional None, Occasional, 0-2, 3-5 /hpf    WBCS 3-5 None, Occasional, 0-2, 3-5 /hpf    BACTERIA Occasional (A) None /hpf    SQUAMOUS EPITHELIAL 0-2 None, Occasional, 0-2, 3-5 /hpf    RENAL EPITHELIAL Occasional None, Occasional, 0-2, 3-5 /hpf    AMORPHOUS SEDIMENT Few (A) None /hpf   COMPREHENSIVE METABOLIC PANEL, NON-FASTING   Result Value Ref Range    SODIUM 129 (L) 136 - 145 mmol/L    POTASSIUM 4.4 3.5 - 5.1 mmol/L    CHLORIDE 100 96 - 111 mmol/L    CO2 TOTAL  19 (L) 23 - 31 mmol/L    ANION GAP 10 4 - 13 mmol/L    BUN 17 8 - 25 mg/dL    CREATININE 1.15 (H) 0.60 - 1.05 mg/dL    BUN/CREA RATIO 15 6 - 22    ESTIMATED GFR 48 (L) >=60 mL/min/BSA    ALBUMIN 3.0 (L) 3.4 - 4.8 g/dL     CALCIUM 8.5 (L) 8.8 - 10.2 mg/dL    GLUCOSE 166 (H) 65 - 125 mg/dL    ALKALINE PHOSPHATASE 128 55 - 145 U/L    ALT (SGPT) 12 8 - 22 U/L    AST (SGOT)  24 8 - 45 U/L    BILIRUBIN TOTAL 0.3 0.3 - 1.3 mg/dL    PROTEIN TOTAL 5.5 (L) 6.0 - 8.0 g/dL   MAGNESIUM   Result Value Ref Range    MAGNESIUM 1.9 1.8 - 2.6 mg/dL   PT/INR   Result Value Ref Range    PROTHROMBIN TIME 10.8 9.4 - 11.3 seconds    INR 1.05 1.00 - 2.00   TROPONIN-I   Result Value Ref Range     TROPONIN I 46 (HH) 7 - 30 ng/L   CBC WITH DIFF   Result Value Ref Range    WBC 5.0 3.7 - 11.0 x103/uL    RBC 3.88 3.85 - 5.22 x106/uL    HGB 11.7 11.5 - 16.0 g/dL    HCT 36.6 34.8 - 46.0 %    MCV 94.3 78.0 - 100.0 fL    MCH 30.2 26.0 - 32.0 pg    MCHC 32.0 31.0 - 35.5 g/dL    RDW-CV 14.1 11.5 - 15.5 %    PLATELETS 169 150 - 400 x103/uL    MPV 10.9 8.7 - 12.5 fL    NEUTROPHIL % 73 %    LYMPHOCYTE % 18 %    MONOCYTE % 7 %    EOSINOPHIL % 1 %    BASOPHIL % 1 %    NEUTROPHIL # 3.72 1.50 - 7.70 x103/uL    LYMPHOCYTE # 0.88 (L) 1.00 - 4.80 x103/uL    MONOCYTE # 0.33 0.20 - 1.10 x103/uL    EOSINOPHIL # <0.10 <=0.50 x103/uL    BASOPHIL # <0.10 <=0.20 x103/uL    IMMATURE GRANULOCYTE % 0 0 - 1 %    IMMATURE GRANULOCYTE # <0.10 <0.10 x103/uL   POC BLOOD GLUCOSE (RESULTS)   Result Value Ref Range    GLUCOSE, POC 105 60 - 110 mg/dl   POC BLOOD GLUCOSE (RESULTS)   Result Value Ref Range    GLUCOSE, POC 92 60 - 110 mg/dl       Imaging Studies:    XR AP MOBILE CHEST   ED Interpretation by Gates Rigg, MD (04/15 [id number])   Tunneled line to right chest, no obvious acute findings      Final Result by Edi, Radresults In (04/15 [id number])   Rule out left lower lung pneumonia. 2 view chest is suggested      This note was partially created using voice recognition software and is   inherently subject to errors including those of syntax and "sound alike "   substitutions which may escape proof reading.  In such instances, original   meaning may be extrapolated by contextual derivation.         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  She also been started on IV antibiotics for likely urinary tract infection.  Urine culture has been ordered as pending at this time    Patient has significant edema.  She does this been present for quite some time.  Will start her on a Lasix IV    Patient was hypoglycemic on arrival to the emergency room.  We have held her insulin here will continue with Accu-Cheks and coverage if her sugars get too high will hold her long-acting insulin in her scheduled doses that she only takes at home    Patient has a history of pancreatic cancer, it is my understanding that the patient is not undergoing any treatment this time    Maxcine Ham, MD

## 2020-03-26 NOTE — ED Nurses Note (Signed)
RN spoke with Son, pt son states that she was found half in and half out of her bed/ recliner this morning, decreased level of consciousness with no blankets on .  EMS phoned.  Pt son states that her finger stick was 196 last night, patient did not receive short acting sliding scale insulin last night but she did take her long acting insulin of 10 units sub cutaneous.     Please update daughter in [place] @ [phone]

## 2020-03-26 NOTE — Care Management Notes (Signed)
[facility] Management Note    Patient Name: [name]  Date of Birth: 1946-10-14  Sex: female  Date/Time of Admission: 03/26/2020  7:32 AM  Room/Bed: 1128/B  Payor: MEDICARE / Plan: [place] MEDICARE PART A AND B / Product Type: Medicare /    LOS: 0 days   Primary Care Providers:  Myrene Buddy, MD, MD    (General)  Lerry Liner, MD, MD (Oncology -  Primary)  Royetta Crochet, MD, MD   (Surgeon)    Admitting Diagnosis:  Hypothermia [phone]    Assessment:   Representative from [facility] called to verify patient was an inpatient in this hospital.     Discharge Plan:  Undetermined at this time      The patient will continue to be evaluated for developing discharge needs.     Case Manager: Raelene Bott, CASE MANAGER  Phone: [phone]

## 2020-03-26 NOTE — Nurses Notes (Signed)
Patient arrived to med surg floor wih chief complaint of hypothermia. Rectal temp 94.9 . Bair hugger placed on patient Glucose of 64   Vitals:    03/26/20 1045 03/26/20 1300 03/26/20 1410 03/26/20 1600   BP: (Abnormal) 183/77 134/75  (Abnormal) 147/75   Pulse: 77 78  77   Resp: 18 18  18    Temp: (Abnormal) 34.9 C (94.9 F) 35.5 C (95.9 F) 35.9 C (96.7 F) 36.6 C (97.9 F)   SpO2: 97%      Weight: 87.7 kg (193 lb 4.8 oz)      Height: 1.575 m (5\' 2" )      BMI: 35.43            Body mass index is 35.36 kg/m.

## 2020-03-26 NOTE — ED Nurses Note (Signed)
Report to Emily RN.

## 2020-03-27 LAB — COMPREHENSIVE METABOLIC PANEL, NON-FASTING
ALBUMIN: 2.1 g/dL — ABNORMAL LOW (ref 3.4–4.8)
ALKALINE PHOSPHATASE: 87 U/L (ref 55–145)
ALT (SGPT): 8 U/L (ref 8–22)
ANION GAP: 4 mmol/L (ref 4–13)
AST (SGOT): 15 U/L (ref 8–45)
BILIRUBIN TOTAL: 0.2 mg/dL — ABNORMAL LOW (ref 0.3–1.3)
BUN/CREA RATIO: 15 (ref 6–22)
BUN: 17 mg/dL (ref 8–25)
CALCIUM: 7.8 mg/dL — ABNORMAL LOW (ref 8.8–10.2)
CHLORIDE: 100 mmol/L (ref 96–111)
CO2 TOTAL: 22 mmol/L — ABNORMAL LOW (ref 23–31)
CREATININE: 1.12 mg/dL — ABNORMAL HIGH (ref 0.60–1.05)
ESTIMATED GFR: 49 mL/min/BSA — ABNORMAL LOW (ref >=60)
GLUCOSE: 228 mg/dL — ABNORMAL HIGH (ref 65–125)
POTASSIUM: 4.3 mmol/L (ref 3.5–5.1)
PROTEIN TOTAL: 4.1 g/dL — ABNORMAL LOW (ref 6.0–8.0)
SODIUM: 126 mmol/L — ABNORMAL LOW (ref 136–145)

## 2020-03-27 LAB — COVID-19 ~~LOC~~ MOLECULAR LAB TESTING: SARS-CoV-2: NOT DETECTED

## 2020-03-27 LAB — CBC WITH DIFF
BASOPHIL #: <0.1 10*3/uL (ref <=0.20)
BASOPHIL %: 1 %
EOSINOPHIL #: 0.16 10*3/uL (ref <=0.50)
EOSINOPHIL %: 4 %
HCT: 26.6 % — ABNORMAL LOW (ref 34.8–46.0)
HGB: 8.5 g/dL — ABNORMAL LOW (ref 11.5–16.0)
IMMATURE GRANULOCYTE #: <0.1 10*3/uL (ref <0.10)
IMMATURE GRANULOCYTE %: 0 % (ref 0–1)
LYMPHOCYTE #: 1.61 10*3/uL (ref 1.00–4.80)
LYMPHOCYTE %: 38 %
MCH: 29.8 pg (ref 26.0–32.0)
MCHC: 32 g/dL (ref 31.0–35.5)
MCV: 93.3 fL (ref 78.0–100.0)
MONOCYTE #: 0.49 10*3/uL (ref 0.20–1.10)
MONOCYTE %: 11 %
MPV: 11.4 fL (ref 8.7–12.5)
NEUTROPHIL #: 1.95 10*3/uL (ref 1.50–7.70)
NEUTROPHIL %: 46 %
PLATELETS: 123 10*3/uL — ABNORMAL LOW (ref 150–400)
RBC: 2.85 10*6/uL — ABNORMAL LOW (ref 3.85–5.22)
RDW-CV: 13.9 % (ref 11.5–15.5)
WBC: 4.3 10*3/uL (ref 3.7–11.0)

## 2020-03-27 LAB — POC BLOOD GLUCOSE (RESULTS)
GLUCOSE, POC: 229 mg/dL — ABNORMAL HIGH (ref 60–110)
GLUCOSE, POC: 295 mg/dL — ABNORMAL HIGH (ref 60–110)
GLUCOSE, POC: 306 mg/dL — ABNORMAL HIGH (ref 60–110)
GLUCOSE, POC: 235 mg/dL — ABNORMAL HIGH (ref 60–110)

## 2020-03-27 MED ORDER — SODIUM CHLORIDE 0.9 % INTRAVENOUS SOLUTION
INTRAVENOUS | Status: DC
Start: 2020-03-27 — End: 2020-03-28

## 2020-03-27 MED ORDER — DEXTROSE 5 % AND 0.45 % SODIUM CHLORIDE INTRAVENOUS SOLUTION
INTRAVENOUS | Status: DC
Start: 2020-03-27 — End: 2020-03-27

## 2020-03-27 NOTE — Progress Notes (Signed)
[facility]  Hospitalist  Progress Note    Date of Service:  03/27/2020  [name], [name], 74 y.o. female  Date of Admission:  03/26/2020  Date of Birth:  [birth year]  PCP: Myrene Buddy, MD    HPI:  Patient lying in bed with eyes shut.  Patient does not open eyes even for exam.  When questioned about her appetite she responded that she eats everything was brought to her.  Patient no longer requiring Bair Hugger.  Sodium slightly lower today at 126.  Patient does have a history of hyponatremia.    acetaminophen (TYLENOL) tablet, 650 mg, Oral, Q4H PRN  aspirin (ECOTRIN) enteric coated tablet 81 mg, 81 mg, Oral, Daily  atorvastatin (LIPITOR) tablet, 10 mg, Oral, QPM  cefTRIAXone (ROCEPHIN) 1 g in NS 100 mL IVPB, 1 g, Intravenous, Q24H  dextrose 50% (0.5 g/mL) injection - syringe, 12.5 g, Intravenous, Q15 Min PRN  dilTIAZem (CARDIZEM) tablet, 30 mg, Oral, 4x/day  furosemide (LASIX) 10 mg/mL injection, 40 mg, Intravenous, 2x/day  HYDROcodone-acetaminophen (NORCO) 5-325 mg per tablet, 1 Tablet, Oral, Q4H PRN  linezolid (ZYVOX) 600 mg in iso-osmotic 300 mL premix IVPB, 600 mg, Intravenous, Q12H  loratadine (CLARITIN) tablet, 10 mg, Oral, Daily  LORazepam (ATIVAN) tablet, 0.5 mg, Oral, QPM  losartan (COZAAR) tablet, 100 mg, Oral, Daily  magnesium hydroxide (MILK OF MAGNESIA) 400mg  per 33mL oral liquid, 15 mL, Oral, Daily PRN  metoprolol succinate (TOPROL-XL) 24 hr extended release tablet, 50 mg, Oral, Daily  NS flush syringe, 10 mL, Intracatheter, Q8HRS  NS flush syringe, 10 mL, Intracatheter, Q1H PRN  NS premix infusion, , Intravenous, Continuous  ondansetron (ZOFRAN) 2 mg/mL injection, 4 mg, Intravenous, Q6H PRN  pancreatic enzyme replacement 5,000 units lipase per cap, 3 Capsule, Oral, 3x/day-Meals  pantoprazole (PROTONIX) delayed release tablet, 40 mg, Oral, Daily  sodium chloride tablet, 1 g, Oral, 3x/day-Meals  SSIP insulin lispro (HUMALOG) 100 units/mL injection, 0-12 Units, Subcutaneous, 4x/day  PRN  tiotropium bromide (SPIRIVA RESPIMAT) 2.5 mcg per inhalation oral inhaler - "Respiratory to administer", 2 Puff, Inhalation, Daily         Allergies   Allergen Reactions    Sulfa (Sulfonamides) Itching             Filed Vitals:    03/27/20 0721 03/27/20 0822 03/27/20 1223 03/27/20 1616   BP:  (!) 160/50 (!) 160/52 (!) 155/58   Pulse:  72 77 72   Resp:  16 16 16    Temp:  36.9 C (98.4 F) 36.8 C (98.2 F) 36.4 C (97.6 F)   SpO2: 98%          Physical Exam  Vitals and nursing note reviewed.   Constitutional:       General: She is in acute distress.      Appearance: She is not diaphoretic.   HENT:      Head: Normocephalic and atraumatic.      Right Ear: External ear normal.      Left Ear: External ear normal.      Nose: Nose normal.   Eyes:      General: No scleral icterus.     Pupils: Pupils are equal, round, and reactive to light.   Cardiovascular:      Rate and Rhythm: Normal rate and regular rhythm.      Heart sounds: Normal heart sounds. No murmur heard.   No friction rub. No gallop.    Pulmonary:      Effort: Pulmonary effort is  normal. No respiratory distress.      Breath sounds: Normal breath sounds. No wheezing or rales.   Chest:      Chest wall: No tenderness.   Abdominal:      General: Bowel sounds are normal. There is no distension.      Palpations: Abdomen is soft. There is no mass.      Tenderness: There is no abdominal tenderness. There is no guarding or rebound.   Musculoskeletal:         General: Normal range of motion.      Cervical back: Normal range of motion and neck supple.   Skin:     General: Skin is warm and dry.      Findings: No erythema.   Neurological:      Mental Status: She is alert and oriented to person, place, and time.      Cranial Nerves: No cranial nerve deficit.   Psychiatric:         Mood and Affect: Mood and affect normal.         Behavior: Behavior is cooperative.          Laboratory Data:     Results for orders placed or performed during the hospital encounter of  03/26/20 (from the past 24 hour(s))   POC BLOOD GLUCOSE (RESULTS)   Result Value Ref Range    GLUCOSE, POC 135 (H) 60 - 110 mg/dl   POC BLOOD GLUCOSE (RESULTS)   Result Value Ref Range    GLUCOSE, POC 262 (H) 60 - 110 mg/dl   COMPREHENSIVE METABOLIC PANEL, NON-FASTING   Result Value Ref Range    SODIUM 126 (L) 136 - 145 mmol/L    POTASSIUM 4.3 3.5 - 5.1 mmol/L    CHLORIDE 100 96 - 111 mmol/L    CO2 TOTAL 22 (L) 23 - 31 mmol/L    ANION GAP 4 4 - 13 mmol/L    BUN 17 8 - 25 mg/dL    CREATININE 1.12 (H) 0.60 - 1.05 mg/dL    BUN/CREA RATIO 15 6 - 22    ESTIMATED GFR 49 (L) >=60 mL/min/BSA    ALBUMIN 2.1 (L) 3.4 - 4.8 g/dL     CALCIUM 7.8 (L) 8.8 - 10.2 mg/dL    GLUCOSE 228 (H) 65 - 125 mg/dL    ALKALINE PHOSPHATASE 87 55 - 145 U/L    ALT (SGPT) 8 8 - 22 U/L    AST (SGOT)  15 8 - 45 U/L    BILIRUBIN TOTAL 0.2 (L) 0.3 - 1.3 mg/dL    PROTEIN TOTAL 4.1 (L) 6.0 - 8.0 g/dL   CBC WITH DIFF   Result Value Ref Range    WBC 4.3 3.7 - 11.0 x103/uL    RBC 2.85 (L) 3.85 - 5.22 x106/uL    HGB 8.5 (L) 11.5 - 16.0 g/dL    HCT 26.6 (L) 34.8 - 46.0 %    MCV 93.3 78.0 - 100.0 fL    MCH 29.8 26.0 - 32.0 pg    MCHC 32.0 31.0 - 35.5 g/dL    RDW-CV 13.9 11.5 - 15.5 %    PLATELETS 123 (L) 150 - 400 x103/uL    MPV 11.4 8.7 - 12.5 fL    NEUTROPHIL % 46 %    LYMPHOCYTE % 38 %    MONOCYTE % 11 %    EOSINOPHIL % 4 %    BASOPHIL % 1 %    NEUTROPHIL # 1.95 1.50 - 7.70 x103/uL  LYMPHOCYTE # 1.61 1.00 - 4.80 x103/uL    MONOCYTE # 0.49 0.20 - 1.10 x103/uL    EOSINOPHIL # 0.16 <=0.50 x103/uL    BASOPHIL # <0.10 <=0.20 x103/uL    IMMATURE GRANULOCYTE % 0 0 - 1 %    IMMATURE GRANULOCYTE # <0.10 <0.10 x103/uL   POC BLOOD GLUCOSE (RESULTS)   Result Value Ref Range    GLUCOSE, POC 229 (H) 60 - 110 mg/dl   POC BLOOD GLUCOSE (RESULTS)   Result Value Ref Range    GLUCOSE, POC 295 (H) 60 - 110 mg/dl       Imaging Studies:    XR AP MOBILE CHEST   ED Interpretation by Gates Rigg, MD (04/15 [id number])   Tunneled line to right chest, no obvious acute  findings      Final Result by Edi, Radresults In (04/15 [id number])   Rule out left lower lung pneumonia. 2 view chest is suggested      This note was partially created using voice recognition software and is   inherently subject to errors including those of syntax and "sound alike "   substitutions which may escape proof reading.  In such instances, original   meaning may be extrapolated by contextual derivation.         Radiologist location ID: [id number]             Assessment/Plan:  Hospital Problems    1Hypothermia         Date Noted: 03/26/2020      Hyperglycemia         Date Noted: 03/26/2020      COPD (chronic obstructive pulmonary disease) (CMS [place])         Date Noted: 04/13/2019      Essential hypertension         Date Noted: 04/13/2019      Hyperlipidemia         Date Noted: 04/13/2019      SIADH (syndrome of inappropriate ADH production) (CMS [place])         Date Noted: 04/13/2019      Anasarca         Date Noted: 08/25/2018      Type 2 diabetes mellitus with diabetic polyneuropathy, with long-term current use of insulin (CMS [facility])         Date Noted: 01/24/2017      Asthma         Date Noted: 01/24/2017      Resolved Hospital Problems  No resolved problems to display.      1. Hypothermia.  Continue monitor body temperature.  Patient appears to be at a normal body temperature at this time.  2. Hypolgycemia.  Continue Accu-Cheks q.a.c. and HS.  Patient has history of diabetes.  She will have sliding scale insulin as needed.  3. SIADH. Continue to closely monitor.  Fluid restrict to a 1000 mL in 24 hours.  4. Asthma.  Chronic stable.  Respiratory protocol.  5. Diabetes.  Accu-Cheks q.a.c. and HS with house sliding scale insulin.  Carb controlled diet.  6. DVT prophylaxis.  Patient will be and SCDs.  Lovenox was stopped secondary to patient having worsening anemia on daily labs.    Pasha Crews, PA-C    I saw and evaluated the patient with the advanced practice provider.  I reviewed the practitioner's note.  I agree  with the findings and plan of care as documented in the note.  My findings are patient admitted to the  hospital with hypothermia and hypoglycemia.  These have corrected.  The patient also had hyponatremia.  Will fluid restrict the patient.  Will repeat lab work in the morning.    Bethanie Dicker, MD  03/28/2020, 20:29

## 2020-03-27 NOTE — Care Management Notes (Signed)
03/27/20 1606   Assessment Details   Assessment Type Admission   Date of Care Management Update 03/27/20   Readmission   Is this a readmission? Yes   Number of days between last admission and this admission? 46   Were your symptoms the same as before? no   Did your support systems work?  Yes   Are you repsonsible for setting up/taking your own medications? Is this working?    (family  assists with medications)   Did you call your [place] Provider  No   Were you able to attend your hospital follow up? No   No, not able to attend hospital follow up appointments had appt  and according to chart patient cancelled due to illness   If you had d/c resources set up proir to discharge what were they, and were you seen by them? Yes   D/C resources set up prior to discharge [facility]   Did you have any barriers for a succesful discharge? Cost of meds, food, transportation No   Medicare Intent to Discharge Documentation   Admit IMM given to: Patient   Admit IMM letter given date 03/26/20   Admit IMM letter time given 1012   IMM explained/reviewed with:  Patient   Care Management Plan   Discharge Planning Status initial meeting   Projected Discharge Date 03/28/20   Discharge plan discussed with: Patient   CM will evaluate for rehabilitation potential no   Facility or Agency Preferences [facility] and will resume at discharge   Patient aware of possible cost for ambulance transport?  Yes   Discharge Needs Assessment   Outpatient/Agency/Support Group Needs homecare agency   Was referral sent to APS? No   Equipment Currently Used at Home walker, rolling;cane, straight   Equipment Needed After Discharge none   Discharge Facility/Level of Care Needs Home with Home Health (code 6)   Transportation Available car;family or friend will provide   Referral Information   Admission Type inpatient   Address Verified verified-no changes   Arrived From home healthcare service   [place] verified-no change   ADVANCE DIRECTIVES   Does the Patient have an Advance Directive? Yes, Patient Does Have Advance Directive for Healthcare Treatment   Type of Advance Directive Completed Medical Power of Attorney   Copy of Advance Directives in Chart? 6   Name of MPOA or [place] - sister   Phone Number of MPOA or Healthcare Surrogate [phone]   Patient Requests Assistance in Having Advance Directive Notarized. N/A   LAY CAREGIVER    Appointed Lay Caregiver? I Decline   Employment/Financial   Patient has Prescription Coverage?  Yes        Name of Insurance Coverage for Medications [employer]   Financial Concerns none   Living Environment   Select an age group to open "lives with" row.  Adult   Lives With child(ren), adult   Living Arrangements house   Able to Return to Prior Arrangements yes   [place] Accessibility no concerns;bed and bath on same level;stairs to enter home   Legal Issues   Do you have a court appointed guardian/conservator? No   Patient Hand-Off   Clinical/Discharge Plan of Care Information Communicated to:  Clinical Care Coordinator   Home Main Entrance   Number of Stairs, Main Entrance six

## 2020-03-27 NOTE — Care Plan (Signed)
Problem: Adult Inpatient Plan of Care  Goal: Plan of Care Review  Outcome: Ongoing (see interventions/notes)  Goal: Patient-Specific Goal (Individualized)  Outcome: Ongoing (see interventions/notes)  Goal: Absence of Hospital-Acquired Illness or Injury  Outcome: Ongoing (see interventions/notes)  Goal: Optimal Comfort and Wellbeing  Outcome: Ongoing (see interventions/notes)  Goal: Rounds/Family Conference  Outcome: Ongoing (see interventions/notes)     Problem: Adjustment to Illness (Targeted Temperature Management)  Goal: Optimal Response to Life-Threatening Event  Outcome: Ongoing (see interventions/notes)     Problem: Body Temperature Regulation (Targeted Temperature Management)  Goal: Target Body Temperature Maintained  Outcome: Ongoing (see interventions/notes)     Problem: Dysrhythmia (Targeted Temperature Management)  Goal: Stable Cardiac Rate and Rhythm  Outcome: Ongoing (see interventions/notes)     Problem: Hemodynamic Instability (Targeted Temperature Management)  Goal: Effective Tissue Perfusion  Outcome: Ongoing (see interventions/notes)     Problem: Infection (Targeted Temperature Management)  Goal: Absence of Infection Signs and Symptoms  Outcome: Ongoing (see interventions/notes)     Problem: Skin Injury Risk Increased  Goal: Skin Health and Integrity  Outcome: Ongoing (see interventions/notes)

## 2020-03-27 NOTE — Respiratory Therapy (Signed)
Patient's O2 sat was 96% on room air with no respiratory distress noted.

## 2020-03-28 LAB — COMPREHENSIVE METABOLIC PANEL, NON-FASTING
ALBUMIN: 2.1 g/dL — ABNORMAL LOW (ref 3.4–4.8)
ALKALINE PHOSPHATASE: 83 U/L (ref 55–145)
ALT (SGPT): 5 U/L — ABNORMAL LOW (ref 8–22)
ANION GAP: 7 mmol/L (ref 4–13)
AST (SGOT): 13 U/L (ref 8–45)
BILIRUBIN TOTAL: 0.2 mg/dL — ABNORMAL LOW (ref 0.3–1.3)
BUN/CREA RATIO: 15 (ref 6–22)
BUN: 16 mg/dL (ref 8–25)
CALCIUM: 7.7 mg/dL — ABNORMAL LOW (ref 8.8–10.2)
CHLORIDE: 101 mmol/L (ref 96–111)
CO2 TOTAL: 22 mmol/L — ABNORMAL LOW (ref 23–31)
CREATININE: 1.1 mg/dL — ABNORMAL HIGH (ref 0.60–1.05)
ESTIMATED GFR: 50 mL/min/BSA — ABNORMAL LOW (ref >=60)
GLUCOSE: 150 mg/dL — ABNORMAL HIGH (ref 65–125)
POTASSIUM: 4.3 mmol/L (ref 3.5–5.1)
PROTEIN TOTAL: 4 g/dL — ABNORMAL LOW (ref 6.0–8.0)
SODIUM: 130 mmol/L — ABNORMAL LOW (ref 136–145)

## 2020-03-28 LAB — CBC WITH DIFF
BASOPHIL #: <0.1 10*3/uL (ref <=0.20)
BASOPHIL %: 2 %
EOSINOPHIL #: 0.16 10*3/uL (ref <=0.50)
EOSINOPHIL %: 5 %
HCT: 26.2 % — ABNORMAL LOW (ref 34.8–46.0)
HGB: 8.3 g/dL — ABNORMAL LOW (ref 11.5–16.0)
IMMATURE GRANULOCYTE #: <0.1 10*3/uL (ref <0.10)
IMMATURE GRANULOCYTE %: 0 % (ref 0–1)
LYMPHOCYTE #: 1.32 10*3/uL (ref 1.00–4.80)
LYMPHOCYTE %: 41 %
MCH: 30 pg (ref 26.0–32.0)
MCHC: 31.7 g/dL (ref 31.0–35.5)
MCV: 94.6 fL (ref 78.0–100.0)
MONOCYTE #: 0.37 10*3/uL (ref 0.20–1.10)
MONOCYTE %: 12 %
MPV: 11.3 fL (ref 8.7–12.5)
NEUTROPHIL #: 1.29 10*3/uL — ABNORMAL LOW (ref 1.50–7.70)
NEUTROPHIL %: 40 %
PLATELETS: 119 10*3/uL — ABNORMAL LOW (ref 150–400)
RBC: 2.77 10*6/uL — ABNORMAL LOW (ref 3.85–5.22)
RDW-CV: 13.8 % (ref 11.5–15.5)
WBC: 3.2 10*3/uL — ABNORMAL LOW (ref 3.7–11.0)

## 2020-03-28 LAB — POC BLOOD GLUCOSE (RESULTS)
GLUCOSE, POC: 168 mg/dL — ABNORMAL HIGH (ref 60–110)
GLUCOSE, POC: 283 mg/dL — ABNORMAL HIGH (ref 60–110)

## 2020-03-28 NOTE — Nurses Notes (Signed)
Patient discharged home with family.  AVS reviewed with patient/care giver son [name].  A written copy of the AVS and discharge instructions was given to the patient/care giver.  Questions sufficiently answered as needed.  Patient/care giver encouraged to follow up with PCP as indicated.  In the event of an emergency, patient/care giver instructed to call 911 or go to the nearest emergency room.

## 2020-03-28 NOTE — Nurses Notes (Signed)
Up to BR x1 assist with walker. Gait slow and steady. Voided well and had small loose BM. No complaints.

## 2020-03-28 NOTE — Discharge Summary (Signed)
[facility]  DISCHARGE SUMMARY      PATIENT NAME:  [name], [name]  MRN:  [id number]  DOB:  [birth year]    INPATIENT ADMISSION DATE: 03/26/2020  DISCHARGE DATE:  03/28/2020    ATTENDING PHYSICIAN: Bethanie Dicker, MD  SERVICE: [facility] HOSPITALIST  PRIMARY CARE PHYSICIAN: Myrene Buddy, MD       DISCHARGE DIAGNOSIS:     Active Hospital Problems    Diagnosis Date Noted    Principle Problem: Hypothermia [T68.XXXA] 03/26/2020    Hyperglycemia [R73.9] 03/26/2020    COPD (chronic obstructive pulmonary disease) (CMS [place]) [J44.9] 04/13/2019    Essential hypertension [I10] 04/13/2019    Hyperlipidemia [E78.5] 04/13/2019    SIADH (syndrome of inappropriate ADH production) (CMS [place]) [E22.2] 04/13/2019    Anasarca [R60.1] 08/25/2018    Type 2 diabetes mellitus with diabetic polyneuropathy, with long-term current use of insulin (CMS [place]) [E11.42, Z79.4] 01/24/2017    Asthma [J45.909] 01/24/2017      Resolved Hospital Problems   No resolved problems to display.             REASON FOR HOSPITALIZATION AND HOSPITAL COURSE:    This is a 74 y.o., female who is admitted for hypoglycemia and hypothermia.  Patient is type 2 diabetic with insulin dependent and has a history of recurrent UTIs will she presented emergency room this morning with altered mental status and hypoglycemia as well as hypothermia.  Patient was home last evening and blood sugar last night was 196.  The patient did not take short-acting insulin but did receive her long-acting insulin at 10 units subcu.  This morning the patient's son found her in bed with decreased level of consciousness with no blankets on.  EMS was called she is found to have a blood sugar of 60. She received D50 and became more alert.  On arrival to the emergency room the patient's level of alertness was still somewhat altered.  She is slow to answer questions.  Her temples found to be 92 degrees Fahrenheit.  Patient had a warming blanket placed and her temperature increased to  94 degrees Fahrenheit by the time I was in the emergency room.  She has a history of VRE with recurrent urinary tract infections.  She started on Rocephin and linezolid in the emergency department.  Urine culture was not obtained.   Patient was kept under hospitalist service.  She did require Bair Hugger for short period of time but her body temperature has remained normal.  Patient has been on Rocephin throughout admission.  Urine culture was not obtained but urinalysis in the emergency department was not overly impressive for source of infection.  Patient has been on Rocephin and done well.  At this point I feel she has been thoroughly treated in outpatient antibiotics are not necessary.  Patient did have hyponatremia.  Sodium this morning is 130.  Patient has history of SIADH.  Patient needs to have fluid restriction to a 1000 mL per day.  This has improved patient's sodium level.  Patient has also had stabilization of her blood sugar.  At this point I feel patient is stable to be discharged home.  She needs to continue her home medications of follow-up with her PCP in next 1-2 weeks.  She needs to be seen sooner for any problems or concerns she may have.  Patient agrees with plan and is discharged home in stable condition.          DISCHARGE MEDICATIONS:  Current Discharge Medication List        CONTINUE these medications - NO CHANGES were made during your visit.        Details   Acetaminophen-Butalbital 50-325 mg Tablet   Oral  Refills: 0     aspirin 81 mg Tablet, Delayed Release (E.C.)  Commonly known as: ECOTRIN   81 mg, Oral, DAILY  Refills: 0     atorvastatin 10 mg Tablet  Commonly known as: LIPITOR   TAKE 1 TABLET BY MOUTH ONCE DAILY  Qty: 90 Tab  Refills: 1     cetirizine 10 mg Tablet  Commonly known as: ZYRTEC   10 mg, Oral, DAILY  Refills: 0     dilTIAZem 30 mg Tablet  Commonly known as: CARDIZEM   30 mg, Oral, 4 TIMES DAILY  Qty: 120 Tablet  Refills: 5     furosemide 80 mg Tablet  Commonly known as:  LASIX   80 mg, Oral, DAILY  Qty: 90 Tablet  Refills: 0     hydrALAZINE 50 mg Tablet  Commonly known as: APRESOLINE   25 mg, Oral, 2 TIMES DAILY  Qty: 30 Tablet  Refills: 5     HYDROcodone-acetaminophen 7.5-325 mg Tablet  Commonly known as: NORCO   TAKE 1 TABLET BY MOUTH EVERY 6 HOURS AS NEEDED FOR PAIN  Refills: 0     insulin aspart U-100 100 unit/mL (3 mL) Insulin Pen  Commonly known as: NovoLOG Flexpen U-100 Insulin   7 Units, Subcutaneous, 2 TIMES DAILY BEFORE MEALS  Qty: 9 Each  Refills: 3     Lantus Solostar U-100 Insulin 100 unit/mL Insulin Pen  Generic drug: insulin glargine   10 Units, Subcutaneous, NIGHTLY  Qty: 3 mL  Refills: 5     lidocaine HCL 2 % Solution  Commonly known as: XYLOCAINE   5 mL  Refills: 0     LORazepam 0.5 mg Tablet  Commonly known as: ATIVAN   0.5 mg, Oral, EVERY EVENING  Qty: 30 Tablet  Refills: 2     losartan 100 mg Tablet  Commonly known as: COZAAR   100 mg, Oral, DAILY  Qty: 90 Tablet  Refills: 0     magnesium oxide 400 mg Tablet  Commonly known as: MAG-OX   400 mg, Oral, 2 TIMES DAILY  Qty: 60 Tablet  Refills: 5     metoprolol succinate 50 mg Tablet Sustained Release 24 hr  Commonly known as: TOPROL-XL   50 mg, Oral, DAILY  Qty: 30 Tablet  Refills: 0     Nano Pen Needle 32 gauge x 5/32" Needle  Generic drug: insulin pen needles   USE WITH INSULIN FLEXPEN BID AS NEEDED  Refills: 0     omeprazole 40 mg Capsule, Delayed Release(E.C.)  Commonly known as: PRILOSEC   40 mg, Oral, DAILY  Qty: 90 Cap  Refills: 4     pancreatic enzyme replacement 12,000-38,000 -60,000 unit Capsule, Delayed Release(E.C.)  Commonly known as: CREON   2 Capsules, Oral, 3 TIMES DAILY WITH MEALS, 2 CAPS WITH MEALS AND 1 CAP WITH EACH SNACK   Qty: 540 Capsule  Refills: 1     Relistor 150 mg Tablet  Generic drug: methylnaltrexone   Relistor 150 mg tablet  TAKE 3 TABLETS BY MOUTH DAILY  Qty: 90 Tablet  Refills: 5     sodium chloride 1 gram Tablet   1 g, Oral, 3 TIMES DAILY WITH MEALS  Qty: 90 Tablet  Refills: 5  tiotropium bromide 18 mcg Capsule, w/Inhalation Device  Commonly known as: SPIRIVA HANDIHALER   18 mcg, Inhalation, DAILY  Qty: 30 Capsule  Refills: 5     trimethoprim 100 mg Tablet  Commonly known as: TRIMPEX   100 mg, Oral, 2 TIMES DAILY  Qty: 60 Tablet  Refills: 5              Filed Vitals:    03/28/20 0049 03/28/20 0427 03/28/20 0756 03/28/20 0757   BP: (!) 155/73 (!) 140/73  113/62   Pulse: 74 68  78   Resp: 16 16  16    Temp: 36.6 C (97.8 F) 36.6 C (97.8 F)  36.7 C (98.1 F)   SpO2:   97%       Physical Exam  Vitals and nursing note reviewed.   Constitutional:       General: She is not in acute distress.     Appearance: She is ill-appearing (Chronically ill-appearing). She is not diaphoretic.   HENT:      Head: Normocephalic and atraumatic.      Right Ear: External ear normal.      Left Ear: External ear normal.      Nose: Nose normal.   Eyes:      General: No scleral icterus.     Pupils: Pupils are equal, round, and reactive to light.   Cardiovascular:      Rate and Rhythm: Normal rate and regular rhythm.      Heart sounds: Normal heart sounds. No murmur heard.   No friction rub. No gallop.    Pulmonary:      Effort: Pulmonary effort is normal. No respiratory distress.      Breath sounds: Normal breath sounds. No wheezing or rales.   Chest:      Chest wall: No tenderness.   Abdominal:      General: Bowel sounds are normal. There is no distension.      Palpations: Abdomen is soft. There is no mass.      Tenderness: There is no abdominal tenderness. There is no guarding or rebound.   Musculoskeletal:         General: Normal range of motion.      Cervical back: Normal range of motion and neck supple.   Skin:     General: Skin is warm and dry.      Findings: No erythema.   Neurological:      Mental Status: She is alert and oriented to person, place, and time.      Cranial Nerves: No cranial nerve deficit.   Psychiatric:         Mood and Affect: Mood and affect normal.         Laboratory Data:     Admission  on 03/26/2020   Component Date Value    GLUCOSE, POC 03/26/2020 101     SODIUM 03/26/2020 129*    POTASSIUM 03/26/2020 4.4     CHLORIDE 03/26/2020 100     CO2 TOTAL 03/26/2020 19*    ANION GAP 03/26/2020 10     BUN 03/26/2020 17     CREATININE 03/26/2020 1.15*    BUN/CREA RATIO 03/26/2020 15     ESTIMATED GFR 03/26/2020 48*    ALBUMIN 03/26/2020 3.0*    CALCIUM 03/26/2020 8.5*    GLUCOSE 03/26/2020 166*    ALKALINE PHOSPHATASE 03/26/2020 128     ALT (SGPT) 03/26/2020 12     AST (SGOT)  03/26/2020 24  BILIRUBIN TOTAL 03/26/2020 0.3     PROTEIN TOTAL 03/26/2020 5.5*    MAGNESIUM 03/26/2020 1.9     PROTHROMBIN TIME 03/26/2020 10.8     INR 03/26/2020 1.05     TROPONIN I 03/26/2020 46*    BLOOD CULTURE, ROUTINE 03/26/2020 No Growth 2 Days     BLOOD CULTURE, ROUTINE 03/26/2020 No Growth 2 Days     Ventricular rate 03/26/2020 75     Atrial Rate 03/26/2020 75     PR Interval 03/26/2020 206     QRS Duration 03/26/2020 90     QT Interval 03/26/2020 450     QTC Calculation 03/26/2020 502     Calculated P Axis 03/26/2020 53     Calculated R Axis 03/26/2020 -19     Calculated T Axis 03/26/2020 33     WBC 03/26/2020 5.0     RBC 03/26/2020 3.88     HGB 03/26/2020 11.7     HCT 03/26/2020 36.6     MCV 03/26/2020 94.3     MCH 03/26/2020 30.2     MCHC 03/26/2020 32.0     RDW-CV 03/26/2020 14.1     PLATELETS 03/26/2020 169     MPV 03/26/2020 10.9     NEUTROPHIL % 03/26/2020 73     LYMPHOCYTE % 03/26/2020 18     MONOCYTE % 03/26/2020 7     EOSINOPHIL % 03/26/2020 1     BASOPHIL % 03/26/2020 1     NEUTROPHIL # 03/26/2020 3.72     LYMPHOCYTE # 03/26/2020 0.88*    MONOCYTE # 03/26/2020 0.33     EOSINOPHIL # 03/26/2020 <0.10     BASOPHIL # 03/26/2020 <0.10     IMMATURE GRANULOCYTE % 03/26/2020 0     IMMATURE GRANULOCYTE # 03/26/2020 <0.10     COLOR 03/26/2020 Yellow     APPEARANCE 03/26/2020 Clear     SPECIFIC GRAVITY 03/26/2020 1.020     PH 03/26/2020 6.0     LEUKOCYTES 03/26/2020 Negative     NITRITE 03/26/2020 Negative     PROTEIN  03/26/2020 100 *    GLUCOSE 03/26/2020 Negative     KETONES 03/26/2020 Negative     UROBILINOGEN 03/26/2020 0.2      BILIRUBIN 03/26/2020 Negative     BLOOD 03/26/2020 Negative     RBCS 03/26/2020 Occasional     WBCS 03/26/2020 3-5     BACTERIA 03/26/2020 Occasional*    SQUAMOUS EPITHELIAL 03/26/2020 0-2     RENAL EPITHELIAL 03/26/2020 Occasional     AMORPHOUS SEDIMENT 03/26/2020 Few*    GLUCOSE, POC 03/26/2020 105     RAINBOW/EXTRA TUBE AUTO * 03/26/2020 Yes     GLUCOSE, POC 03/26/2020 92     SARS-CoV-2 03/26/2020 Not Detected     GLUCOSE, POC 03/26/2020 145*    GLUCOSE, POC 03/26/2020 135*    GLUCOSE, POC 03/26/2020 262*    SODIUM 03/27/2020 126*    POTASSIUM 03/27/2020 4.3     CHLORIDE 03/27/2020 100     CO2 TOTAL 03/27/2020 22*    ANION GAP 03/27/2020 4     BUN 03/27/2020 17     CREATININE 03/27/2020 1.12*    BUN/CREA RATIO 03/27/2020 15     ESTIMATED GFR 03/27/2020 49*    ALBUMIN 03/27/2020 2.1*    CALCIUM 03/27/2020 7.8*    GLUCOSE 03/27/2020 228*    ALKALINE PHOSPHATASE 03/27/2020 87     ALT (SGPT) 03/27/2020 8     AST (SGOT)  03/27/2020 15  BILIRUBIN TOTAL 03/27/2020 0.2*    PROTEIN TOTAL 03/27/2020 4.1*    WBC 03/27/2020 4.3     RBC 03/27/2020 2.85*    HGB 03/27/2020 8.5*    HCT 03/27/2020 26.6*    MCV 03/27/2020 93.3     MCH 03/27/2020 29.8     MCHC 03/27/2020 32.0     RDW-CV 03/27/2020 13.9     PLATELETS 03/27/2020 123*    MPV 03/27/2020 11.4     NEUTROPHIL % 03/27/2020 46     LYMPHOCYTE % 03/27/2020 38     MONOCYTE % 03/27/2020 11     EOSINOPHIL % 03/27/2020 4     BASOPHIL % 03/27/2020 1     NEUTROPHIL # 03/27/2020 1.95     LYMPHOCYTE # 03/27/2020 1.61     MONOCYTE # 03/27/2020 0.49     EOSINOPHIL # 03/27/2020 0.16     BASOPHIL # 03/27/2020 <0.10     IMMATURE GRANULOCYTE % 03/27/2020 0     IMMATURE GRANULOCYTE # 03/27/2020 <0.10     GLUCOSE, POC 03/27/2020 229*    GLUCOSE, POC 03/27/2020 295*    GLUCOSE, POC 03/27/2020 306*    GLUCOSE, POC 03/27/2020 235*    SODIUM 03/28/2020 130*    POTASSIUM  03/28/2020 4.3     CHLORIDE 03/28/2020 101     CO2 TOTAL 03/28/2020 22*    ANION GAP 03/28/2020 7     BUN 03/28/2020 16     CREATININE 03/28/2020 1.10*    BUN/CREA RATIO 03/28/2020 15     ESTIMATED GFR 03/28/2020 50*    ALBUMIN 03/28/2020 2.1*    CALCIUM 03/28/2020 7.7*    GLUCOSE 03/28/2020 150*    ALKALINE PHOSPHATASE 03/28/2020 83     ALT (SGPT) 03/28/2020 5*    AST (SGOT)  03/28/2020 13     BILIRUBIN TOTAL 03/28/2020 0.2*    PROTEIN TOTAL 03/28/2020 4.0*    WBC 03/28/2020 3.2*    RBC 03/28/2020 2.77*    HGB 03/28/2020 8.3*    HCT 03/28/2020 26.2*    MCV 03/28/2020 94.6     MCH 03/28/2020 30.0     MCHC 03/28/2020 31.7     RDW-CV 03/28/2020 13.8     PLATELETS 03/28/2020 119*    MPV 03/28/2020 11.3     NEUTROPHIL % 03/28/2020 40     LYMPHOCYTE % 03/28/2020 41     MONOCYTE % 03/28/2020 12     EOSINOPHIL % 03/28/2020 5     BASOPHIL % 03/28/2020 2     NEUTROPHIL # 03/28/2020 1.29*    LYMPHOCYTE # 03/28/2020 1.32     MONOCYTE # 03/28/2020 0.37     EOSINOPHIL # 03/28/2020 0.16     BASOPHIL # 03/28/2020 <0.10     IMMATURE GRANULOCYTE % 03/28/2020 0     IMMATURE GRANULOCYTE # 03/28/2020 <0.10     GLUCOSE, POC 03/28/2020 168*        Imaging Studies:    XR AP MOBILE CHEST   ED Interpretation by Gates Rigg, MD (04/15 0913)   Tunneled line to right chest, no obvious acute findings      Final Result by Edi, Radresults In (04/15 [id number])   Rule out left lower lung pneumonia. 2 view chest is suggested      This note was partially created using voice recognition software and is   inherently subject to errors including those of syntax and "sound alike "   substitutions which may escape proof reading.  In such instances, original  meaning may be extrapolated by contextual derivation.         Radiologist location ID: [id number]                    DISCHARGE INSTRUCTIONS:  Follow-up Information       Family Medicine, [facility] .    Specialty: Family Medicine  Contact information:  [place] SSN-[id number]  [phone]                    DISCHARGE INSTRUCTION - DIET     Diet: RESUME HOME DIET      DISCHARGE INSTRUCTION - ACTIVITY     Activity: AS TOLERATED      DISCHARGE INSTRUCTION - [place]    Resume home medications  No antibiotics needed at this time for potential urinary tract infection.  Patient has been on antibiotics throughout admission and additional antibiotics are not required at this time.  Increase intake of water     FOLLOW-UP: [place], [place]     Follow-up in: [place]    Reason for visit: [place] form not discussed    CONDITION ON DISCHARGE: Alert, Oriented and VS Stable    DISCHARGE DISPOSITION:  Home discharge      DISPOSITION TIME:  40 minutes      Alvine Crews, PA-C    I saw and evaluated the patient with the advanced practice provider.  I reviewed the practitioner's note.  I agree with the findings and plan of care as documented in the note.  My findings are patient admitted to the hospital with hypoglycemia, hypothermia, and hyponatremia.  Hypothermia and hypoglycemia have been corrected.  Hyponatremia is improved.  Sodium is 130 this morning.  Patient can be discharged home today.  Patient follow-up PCP in 1 week.    Bethanie Dicker, MD  03/28/2020, 20:30

## 2020-03-28 NOTE — Nurses Notes (Signed)
53ml balloon deflated and foley catheter removed intact at this time. Patient tolerated well. Explained to patient she will be discharged. Will monitor for urine output before discharge.

## 2020-03-30 ENCOUNTER — Telehealth (HOSPITAL_COMMUNITY): Payer: Self-pay

## 2020-03-30 NOTE — Nursing Note (Signed)
(  dot)Transition of Care Contact  Discharge Date: 03/28/2020  Transition Facility Type--Hospital (Inpatient or Observation)  [facility]  Interactive Contact(s): Completed or attempted contact indicated by Date/Time  Completed Contact--03/30/2020  2:15 PM  First Attempt  Second Attempt  Third Attempt  Contact Method(s)-- Patient/Caregiver Telephone  Clinical Staff Name/Role who [name], LPN  Transition Assessment  Discharge Summary obtained?--Yes  How are you recovering?--Improving  Discharge Meds obtained?--Yes  Medications reconcilled with Discharge Documentation?--Yes  Medication understanding --no new discharge meds prescribed--has assistance managing medications  Medication Concerns?--No  Have everything needed for recovery?--Yes  Care Coordination:   Patient has transition follow-up appointment date and time?--Yes  Primary Care Transition Visit planned?--Yes  Specialist Transition Visit planned?  Patient/caregiver plans to attend transition visit?  Primary Follow-up Barrier  Interventions provided --reinforced discharge instructions--follow-up appointment date/time reinforced--coordinated home care [place] or DME ordered at discharge?--Yes  Agency has contacted patient to initiate services?--Yes  Home Health or DME Agency:--[facility]  Clinician/Team notified?  Primary reason clinician notified?  Transition Note:   Spoke with daughter in law who states patient is doing better today. Blood sugar and temp WNL. No new complaints or concerns.  Follows up with Dr [name] on 04/03/2020 at 1230.     Further information may be documented in relevant telephone or outreach encounter.

## 2020-03-30 NOTE — Care Management Notes (Signed)
New order received to resume home health services.  Patient established with [facility].  ROC orders sent at this time.

## 2020-03-31 ENCOUNTER — Emergency Department (HOSPITAL_COMMUNITY)

## 2020-03-31 ENCOUNTER — Other Ambulatory Visit: Payer: Self-pay

## 2020-03-31 ENCOUNTER — Observation Stay
Admission: EM | Admit: 2020-03-31 | Discharge: 2020-04-01 | Disposition: A | Discharge status: Home Health | Payer: Medicare Other | Attending: Family Medicine | Admitting: Family Medicine

## 2020-03-31 ENCOUNTER — Encounter (HOSPITAL_COMMUNITY): Payer: Self-pay

## 2020-03-31 DIAGNOSIS — J449 Chronic obstructive pulmonary disease, unspecified: Secondary | ICD-10-CM | POA: Diagnosis present

## 2020-03-31 DIAGNOSIS — Z86711 Personal history of pulmonary embolism: Secondary | ICD-10-CM | POA: Insufficient documentation

## 2020-03-31 DIAGNOSIS — I110 Hypertensive heart disease with heart failure: Secondary | ICD-10-CM | POA: Insufficient documentation

## 2020-03-31 DIAGNOSIS — Z79891 Long term (current) use of opiate analgesic: Secondary | ICD-10-CM | POA: Insufficient documentation

## 2020-03-31 DIAGNOSIS — J45909 Unspecified asthma, uncomplicated: Secondary | ICD-10-CM | POA: Diagnosis present

## 2020-03-31 DIAGNOSIS — Z20822 Contact with and (suspected) exposure to covid-19: Secondary | ICD-10-CM | POA: Insufficient documentation

## 2020-03-31 DIAGNOSIS — I10 Essential (primary) hypertension: Secondary | ICD-10-CM | POA: Diagnosis present

## 2020-03-31 DIAGNOSIS — R079 Chest pain, unspecified: Secondary | ICD-10-CM | POA: Insufficient documentation

## 2020-03-31 DIAGNOSIS — Z7982 Long term (current) use of aspirin: Secondary | ICD-10-CM | POA: Insufficient documentation

## 2020-03-31 DIAGNOSIS — I509 Heart failure, unspecified: Secondary | ICD-10-CM | POA: Diagnosis present

## 2020-03-31 DIAGNOSIS — Z794 Long term (current) use of insulin: Secondary | ICD-10-CM | POA: Insufficient documentation

## 2020-03-31 DIAGNOSIS — Z79899 Other long term (current) drug therapy: Secondary | ICD-10-CM | POA: Insufficient documentation

## 2020-03-31 DIAGNOSIS — Z87891 Personal history of nicotine dependence: Secondary | ICD-10-CM | POA: Insufficient documentation

## 2020-03-31 DIAGNOSIS — E1142 Type 2 diabetes mellitus with diabetic polyneuropathy: Secondary | ICD-10-CM | POA: Insufficient documentation

## 2020-03-31 DIAGNOSIS — E222 Syndrome of inappropriate secretion of antidiuretic hormone: Secondary | ICD-10-CM | POA: Insufficient documentation

## 2020-03-31 DIAGNOSIS — J189 Pneumonia, unspecified organism: Secondary | ICD-10-CM

## 2020-03-31 DIAGNOSIS — C259 Malignant neoplasm of pancreas, unspecified: Secondary | ICD-10-CM | POA: Insufficient documentation

## 2020-03-31 DIAGNOSIS — E871 Hypo-osmolality and hyponatremia: Secondary | ICD-10-CM | POA: Diagnosis present

## 2020-03-31 LAB — CBC WITH DIFF
BASOPHIL #: <0.1 10*3/uL (ref <=0.20)
BASOPHIL %: 1 %
EOSINOPHIL #: <0.1 10*3/uL (ref <=0.50)
EOSINOPHIL %: 2 %
HCT: 31.9 % — ABNORMAL LOW (ref 34.8–46.0)
HGB: 10 g/dL — ABNORMAL LOW (ref 11.5–16.0)
IMMATURE GRANULOCYTE #: <0.1 10*3/uL (ref <0.10)
IMMATURE GRANULOCYTE %: 0 % (ref 0–1)
LYMPHOCYTE #: 1.37 10*3/uL (ref 1.00–4.80)
LYMPHOCYTE %: 33 %
MCH: 30 pg (ref 26.0–32.0)
MCHC: 31.3 g/dL (ref 31.0–35.5)
MCV: 95.8 fL (ref 78.0–100.0)
MONOCYTE #: 0.3 10*3/uL (ref 0.20–1.10)
MONOCYTE %: 7 %
MPV: 10.9 fL (ref 8.7–12.5)
NEUTROPHIL #: 2.32 10*3/uL (ref 1.50–7.70)
NEUTROPHIL %: 57 %
PLATELETS: 130 10*3/uL — ABNORMAL LOW (ref 150–400)
RBC: 3.33 10*6/uL — ABNORMAL LOW (ref 3.85–5.22)
RDW-CV: 14.3 % (ref 11.5–15.5)
WBC: 4.1 10*3/uL (ref 3.7–11.0)

## 2020-03-31 LAB — COMPREHENSIVE METABOLIC PANEL, NON-FASTING
ALBUMIN: 2.6 g/dL — ABNORMAL LOW (ref 3.4–4.8)
ALKALINE PHOSPHATASE: 109 U/L (ref 55–145)
ALT (SGPT): 17 U/L (ref 8–22)
ANION GAP: 8 mmol/L (ref 4–13)
AST (SGOT): 29 U/L (ref 8–45)
BILIRUBIN TOTAL: 0.3 mg/dL (ref 0.3–1.3)
BUN/CREA RATIO: 12 (ref 6–22)
BUN: 16 mg/dL (ref 8–25)
CALCIUM: 7.8 mg/dL — ABNORMAL LOW (ref 8.8–10.2)
CHLORIDE: 99 mmol/L (ref 96–111)
CO2 TOTAL: 24 mmol/L (ref 23–31)
CREATININE: 1.3 mg/dL — ABNORMAL HIGH (ref 0.60–1.05)
ESTIMATED GFR: 41 mL/min/BSA — ABNORMAL LOW (ref >=60)
GLUCOSE: 271 mg/dL — ABNORMAL HIGH (ref 65–125)
POTASSIUM: 4.6 mmol/L (ref 3.5–5.1)
PROTEIN TOTAL: 4.7 g/dL — ABNORMAL LOW (ref 6.0–8.0)
SODIUM: 131 mmol/L — ABNORMAL LOW (ref 136–145)

## 2020-03-31 LAB — PTT (PARTIAL THROMBOPLASTIN TIME): APTT: 25.7 s (ref 21.7–33.2)

## 2020-03-31 LAB — TROPONIN-I
TROPONIN I: 33 ng/L (ref 7–30)
TROPONIN I: 34 ng/L — ABNORMAL HIGH (ref 7–30)

## 2020-03-31 LAB — PT/INR
INR: 1.1 (ref 1.00–2.00)
PROTHROMBIN TIME: 11.3 s (ref 9.4–11.3)

## 2020-03-31 LAB — COVID-19, FLU A/B, RSV RAPID BY PCR
INFLUENZA VIRUS TYPE A: NOT DETECTED
INFLUENZA VIRUS TYPE B: NOT DETECTED
RESPIRATORY SYNCTIAL VIRUS (RSV): NOT DETECTED
SARS-CoV-2: NOT DETECTED

## 2020-03-31 LAB — ADULT ROUTINE BLOOD CULTURE, SET OF 2 BOTTLES (BACTERIA AND YEAST)
BLOOD CULTURE, ROUTINE: NO GROWTH
BLOOD CULTURE, ROUTINE: NO GROWTH

## 2020-03-31 LAB — POC BLOOD GLUCOSE (RESULTS): GLUCOSE, POC: 309 mg/dL — ABNORMAL HIGH (ref 60–110)

## 2020-03-31 MED ORDER — LORATADINE 10 MG TABLET
10.00 mg | ORAL_TABLET | Freq: Every day | ORAL | Status: DC
Start: 2020-04-01 — End: 2020-04-01
  Administered 2020-04-01: 10 mg via ORAL
  Filled 2020-03-31: qty 1

## 2020-03-31 MED ORDER — SODIUM CHLORIDE 0.9 % IV BOLUS
500.00 mL | INJECTION | Status: AC
Start: 2020-03-31 — End: 2020-03-31
  Administered 2020-03-31: 14:00:00 0 mL via INTRAVENOUS
  Administered 2020-03-31: 500 mL via INTRAVENOUS

## 2020-03-31 MED ORDER — ATORVASTATIN 10 MG TABLET
10.00 mg | ORAL_TABLET | Freq: Every evening | ORAL | Status: DC
Start: 2020-03-31 — End: 2020-04-01
  Administered 2020-03-31: 20:00:00 10 mg via ORAL
  Filled 2020-03-31: qty 1

## 2020-03-31 MED ORDER — DEXTROSE 50 % IN WATER (D50W) INTRAVENOUS SYRINGE
12.50 g | INJECTION | INTRAVENOUS | Status: DC | PRN
Start: 2020-03-31 — End: 2020-04-01

## 2020-03-31 MED ORDER — SODIUM CHLORIDE 0.9 % (FLUSH) INJECTION SYRINGE
10.00 mL | INJECTION | Freq: Three times a day (TID) | INTRAMUSCULAR | Status: DC
Start: 2020-03-31 — End: 2020-04-01
  Administered 2020-03-31: 20:00:00 10 mL
  Administered 2020-04-01: 0 mL

## 2020-03-31 MED ORDER — METOPROLOL SUCCINATE ER 50 MG TABLET,EXTENDED RELEASE 24 HR
50.00 mg | ORAL_TABLET | Freq: Every day | ORAL | Status: DC
Start: 2020-04-01 — End: 2020-04-01
  Administered 2020-04-01: 50 mg via ORAL
  Filled 2020-03-31: qty 1

## 2020-03-31 MED ORDER — TRIMETHOPRIM 100 MG TABLET
100.00 mg | ORAL_TABLET | Freq: Two times a day (BID) | ORAL | Status: DC
Start: 2020-03-31 — End: 2020-04-01
  Administered 2020-03-31 – 2020-04-01 (×2): 100 mg via ORAL
  Filled 2020-03-31 (×2): qty 1

## 2020-03-31 MED ORDER — SODIUM CHLORIDE 0.9 % (FLUSH) INJECTION SYRINGE
10.00 mL | INJECTION | INTRAMUSCULAR | Status: DC | PRN
Start: 2020-03-31 — End: 2020-04-01

## 2020-03-31 MED ORDER — ENOXAPARIN 30 MG/0.3 ML SUBCUTANEOUS SYRINGE
30.00 mg | INJECTION | Freq: Two times a day (BID) | SUBCUTANEOUS | Status: DC
Start: 2020-03-31 — End: 2020-04-01
  Administered 2020-03-31: 30 mg via SUBCUTANEOUS
  Filled 2020-03-31: qty 0.3

## 2020-03-31 MED ORDER — LORAZEPAM 0.5 MG TABLET
0.50 mg | ORAL_TABLET | Freq: Every evening | ORAL | Status: DC
Start: 2020-03-31 — End: 2020-04-01
  Administered 2020-03-31: 20:00:00 0.5 mg via ORAL
  Filled 2020-03-31: qty 1

## 2020-03-31 MED ORDER — SODIUM CHLORIDE 0.9 % INTRAVENOUS SOLUTION
INTRAVENOUS | Status: DC
Start: 2020-03-31 — End: 2020-04-01

## 2020-03-31 MED ORDER — INSULIN GLARGINE 100 UNITS/ML SUBQ - CHARGE BY DOSE
10.00 [IU] | Freq: Every evening | SUBCUTANEOUS | Status: DC
Start: 2020-03-31 — End: 2020-04-01
  Administered 2020-03-31: 10 [IU] via SUBCUTANEOUS
  Filled 2020-03-31: qty 10

## 2020-03-31 MED ORDER — HYDRALAZINE 25 MG TABLET
25.00 mg | ORAL_TABLET | Freq: Two times a day (BID) | ORAL | Status: DC
Start: 2020-03-31 — End: 2020-04-01
  Administered 2020-03-31 – 2020-04-01 (×2): 25 mg via ORAL
  Filled 2020-03-31 (×2): qty 1

## 2020-03-31 MED ORDER — MAGNESIUM HYDROXIDE 400 MG/5 ML ORAL SUSPENSION
15.00 mL | Freq: Every day | ORAL | Status: DC | PRN
Start: 2020-03-31 — End: 2020-04-01

## 2020-03-31 MED ORDER — SODIUM CHLORIDE 1 GRAM TABLET
1.00 g | ORAL_TABLET | Freq: Three times a day (TID) | ORAL | Status: DC
Start: 2020-03-31 — End: 2020-04-01
  Administered 2020-03-31 – 2020-04-01 (×3): 1 g via ORAL
  Filled 2020-03-31 (×3): qty 1

## 2020-03-31 MED ORDER — TIOTROPIUM BROMIDE 2.5 MCG/ACTUATION MIST FOR INHALATION
2.00 | Freq: Every day | RESPIRATORY_TRACT | Status: DC
Start: 2020-04-01 — End: 2020-04-01
  Administered 2020-04-01: 08:00:00 2 via RESPIRATORY_TRACT
  Filled 2020-03-31: qty 4

## 2020-03-31 MED ORDER — MAGNESIUM OXIDE 400 MG (241.3 MG MAGNESIUM) TABLET
400.00 mg | ORAL_TABLET | Freq: Two times a day (BID) | ORAL | Status: AC
Start: 2020-03-31 — End: 2020-04-01
  Administered 2020-03-31 – 2020-04-01 (×2): 400 mg via ORAL
  Filled 2020-03-31 (×2): qty 1

## 2020-03-31 MED ORDER — DILTIAZEM 30 MG TABLET
30.00 mg | ORAL_TABLET | Freq: Four times a day (QID) | ORAL | Status: DC
Start: 2020-03-31 — End: 2020-04-01
  Administered 2020-03-31 – 2020-04-01 (×4): 30 mg via ORAL
  Filled 2020-03-31 (×4): qty 1

## 2020-03-31 MED ORDER — FUROSEMIDE 40 MG TABLET
80.00 mg | ORAL_TABLET | Freq: Every day | ORAL | Status: DC
Start: 2020-04-01 — End: 2020-04-01
  Administered 2020-04-01: 80 mg via ORAL
  Filled 2020-03-31: qty 2

## 2020-03-31 MED ORDER — ASPIRIN 81 MG TABLET,DELAYED RELEASE
81.00 mg | DELAYED_RELEASE_TABLET | Freq: Every day | ORAL | Status: DC
Start: 2020-04-01 — End: 2020-04-01
  Administered 2020-04-01: 81 mg via ORAL
  Filled 2020-03-31: qty 1

## 2020-03-31 MED ORDER — LIPASE-PROTEASE-AMYLASE 5,000-17,000-24,000 UNIT CAPSULE, DELAYED REL
1.00 | DELAYED_RELEASE_CAPSULE | Freq: Three times a day (TID) | ORAL | Status: DC
Start: 2020-03-31 — End: 2020-04-01
  Administered 2020-03-31 – 2020-04-01 (×3): 1 via ORAL
  Filled 2020-03-31 (×3): qty 1

## 2020-03-31 MED ORDER — LOSARTAN 50 MG TABLET
100.00 mg | ORAL_TABLET | Freq: Every day | ORAL | Status: DC
Start: 2020-04-01 — End: 2020-04-01
  Administered 2020-04-01: 100 mg via ORAL
  Filled 2020-03-31: qty 2

## 2020-03-31 MED ORDER — INSULIN LISPRO 100 UNIT/ML SUB-Q SSIP - RMH
0.00 [IU] | INJECTION | Freq: Four times a day (QID) | SUBCUTANEOUS | Status: DC
Start: 2020-03-31 — End: 2020-04-01
  Administered 2020-03-31: 0 [IU] via SUBCUTANEOUS
  Administered 2020-04-01: 6 [IU] via SUBCUTANEOUS
  Administered 2020-04-01: 0 [IU] via SUBCUTANEOUS
  Filled 2020-03-31: qty 6

## 2020-03-31 MED ORDER — PANTOPRAZOLE 40 MG TABLET,DELAYED RELEASE
40.00 mg | DELAYED_RELEASE_TABLET | Freq: Every day | ORAL | Status: DC
Start: 2020-03-31 — End: 2020-04-01
  Administered 2020-03-31: 18:00:00 0 mg via ORAL
  Administered 2020-04-01: 40 mg via ORAL
  Filled 2020-03-31 (×2): qty 1

## 2020-03-31 MED ORDER — ONDANSETRON HCL (PF) 4 MG/2 ML INJECTION SOLUTION
4.00 mg | Freq: Four times a day (QID) | INTRAMUSCULAR | Status: DC | PRN
Start: 2020-03-31 — End: 2020-04-01

## 2020-03-31 MED ORDER — SODIUM CHLORIDE 0.9 % INTRAVENOUS PIGGYBACK
2.2500 g | Freq: Three times a day (TID) | INTRAVENOUS | Status: DC
Start: 2020-04-01 — End: 2020-04-01
  Administered 2020-04-01: 0 g via INTRAVENOUS
  Administered 2020-04-01 (×2): 2.25 g via INTRAVENOUS
  Filled 2020-03-31 (×2): qty 10

## 2020-03-31 MED ORDER — TIOTROPIUM BROMIDE 2.5 MCG/ACTUATION MIST FOR INHALATION
2.00 | Freq: Every day | RESPIRATORY_TRACT | Status: DC
Start: 2020-03-31 — End: 2020-03-31

## 2020-03-31 MED ORDER — IODIXANOL 320 MG IODINE/ML INTRAVENOUS SOLUTION
100.00 mL | INTRAVENOUS | Status: AC
Start: 2020-03-31 — End: 2020-03-31
  Administered 2020-03-31: 100 mL via INTRAVENOUS

## 2020-03-31 MED ORDER — HYDROCODONE 10 MG-ACETAMINOPHEN 325 MG TABLET
1.00 | ORAL_TABLET | Freq: Four times a day (QID) | ORAL | Status: DC | PRN
Start: 2020-03-31 — End: 2020-04-01

## 2020-03-31 MED ORDER — SODIUM CHLORIDE 0.9 % INTRAVENOUS PIGGYBACK
3.3750 g | Freq: Three times a day (TID) | INTRAVENOUS | Status: DC
Start: 2020-03-31 — End: 2020-03-31
  Administered 2020-03-31: 3.375 g via INTRAVENOUS
  Administered 2020-03-31: 0 g via INTRAVENOUS
  Filled 2020-03-31: qty 15

## 2020-03-31 MED ORDER — ASPIRIN 81 MG CHEWABLE TABLET
324.00 mg | CHEWABLE_TABLET | ORAL | Status: AC
Start: 2020-03-31 — End: 2020-03-31
  Administered 2020-03-31: 324 mg via ORAL
  Filled 2020-03-31: qty 4

## 2020-03-31 NOTE — ED Nurses Note (Signed)
[place] at bedside.

## 2020-03-31 NOTE — H&P (Signed)
[facility]  Hospitalist  History & Physical    Date of Service:  03/31/2020  [name], [name], 74 y.o. female  Date of Admission:  03/31/2020  Date of Birth:  1946-07-18  PCP: Myrene Buddy, MD    Chief Complaint:  Chest pain    HPI:  [name] is a 74 y.o. White female who is admitted for chest pain, rule out MI.  Patient's son is at bedside.  He reports patient complained of some Chest pain last night.  Patient had a sudden onset of sharp chest pain on the left side of her chest was sitting in her chair talking to her physical therapy nurse prior to admission.  Patient complained according to ER note of an 8/10 sharp pain.  After arriving in the emergency room it was currently a 2/10.  Patient did not have any alleviating or exacerbating factors.  Pain did not radiate into her neck or shoulder.  Patient denied nausea, vomiting or shortness of breath.  Patient has no previous history of MI, stents or bypasses.  Patient was recently discharged from our facility on 03/28/2020.  Emergency room patient laboratory studies and diagnostic imaging.  Patient was found to have a troponin of 33.  COVID test was negative.  Repeat troponin was 34. Patient's labs otherwise were fairly unremarkable.  Patient was placed under hospitalist service as a chest pain rule out.      Past Medical History:   Diagnosis Date    Abdominal hernia 10/18/2017    hx of repair    Anxiety     Arthritis     Asthma     Atrial fibrillation (CMS HCC)     Back problem     Bruises easily     Cancer (CMS [place]) 10/18/2017    chemo and radiation completed 09/24/2017    COPD (chronic obstructive pulmonary disease) (CMS HCC)     CPAP (continuous positive airway pressure) dependence 10/18/2017    has not used recently    Depression     DM (diabetes mellitus) (CMS [place]) 2000    Fasting BG 200's. HGA1C 2018 6    Edema     Essential hypertension     GERD (gastroesophageal reflux disease)     Headache     Heartburn     Hx antineoplastic  chemo 2018    Hx of radiation therapy 2018    Hypercholesteremia     Hyperlipidemia     Migraine 10/18/2017    none recently    Obesity     Palpitations     Pancreatic cancer (CMS [place]) 03/2017    Panic attack     PE (pulmonary thromboembolism) (CMS HCC) 03/29/2018    Peripheral neuropathy 10/18/2017    knees down, bilateral    Sepsis (CMS [place]) 04/15/2019    Sleep apnea     Type 2 diabetes mellitus (CMS [place]) 10/18/2017    Dx 2000 FBS 200s    Unintentional weight loss 10/18/2017    25 lbs 03/2017    UTI (urinary tract infection)     Wears glasses       Past Surgical History:   Procedure Laterality Date    CESAREAN SECTION      COLONOSCOPY  10/04/2019    Diverticuli a colonoscopy by Dr. Tonye Becket    HX CESAREAN SECTION      HX CHOLECYSTECTOMY      HX HERNIA REPAIR      HX SUBCLAVIAN PORT IMPLANTION  s/p removed    HX SUBCLAVIAN PORT IMPLANTION      HX TONSIL AND ADENOIDECTOMY      HX TONSILLECTOMY      PANCREATICODUODENECTOMY  99991111    UMBILICAL HERNIA REPAIR      x2    WHIPPLE PROCEDURE W/ LAPAROSCOPY        Social History     Tobacco Use    Smoking status: Former Smoker     Packs/day: 0.50     Years: 20.00     Pack years: 10.00     Types: Cigarettes    Smokeless tobacco: Never Used    Tobacco comment: unsure of quit date   Vaping Use    Vaping Use: Never used   Substance Use Topics    Alcohol use: Never    Drug use: Never       Family Medical History:       Problem Relation (Age of Onset)    Alzheimer's/Dementia Father (48)    Cancer Sister    Diabetes Mother    Heart Attack Mother (56)    Heart Disease Mother, Brother    Hypertension (High Blood Pressure) Other    Lung disease Father    Other Mother (74)    Pacemaker Sister    Respiratory Problems Father           Medications Prior to Admission       Prescriptions    Acetaminophen-Butalbital 50-325 mg Oral Tablet    Take by mouth    aspirin (ECOTRIN) 81 mg Oral Tablet, Delayed Release (E.C.)    Take 1 Tab (81 mg total) by mouth Once a day    atorvastatin (LIPITOR)  10 mg Oral Tablet    TAKE 1 TABLET BY MOUTH ONCE DAILY    cetirizine (ZYRTEC) 10 mg Oral Tablet    Take 1 Tab (10 mg total) by mouth Once a day    dilTIAZem (CARDIZEM) 30 mg Oral Tablet    Take 1 Tablet (30 mg total) by mouth Four times a day for 30 days    furosemide (LASIX) 80 mg Oral Tablet    Take 1 Tablet (80 mg total) by mouth Once a day for 90 days    hydrALAZINE (APRESOLINE) 50 mg Oral Tablet    Take 0.5 Tablets (25 mg total) by mouth Twice daily for 30 days    HYDROcodone-acetaminophen (NORCO) 7.5-325 mg Oral Tablet    TAKE 1 TABLET BY MOUTH EVERY 6 HOURS AS NEEDED FOR PAIN    insulin aspart U-100 (NOVOLOG FLEXPEN U-100 INSULIN) 100 unit/mL (3 mL) Subcutaneous Insulin Pen    7 Units by Subcutaneous route Twice a day before meals    insulin glargine (LANTUS SOLOSTAR U-100 INSULIN) 100 unit/mL Subcutaneous Insulin Pen    10 Units by Subcutaneous route Every night    lidocaine HCL (XYLOCAINE) 2 % Mucous Membrane Solution    5 mL    LORazepam (ATIVAN) 0.5 mg Oral Tablet    Take 1 Tablet (0.5 mg total) by mouth Every evening    losartan (COZAAR) 100 mg Oral Tablet    Take 1 Tablet (100 mg total) by mouth Once a day for 90 days    magnesium oxide (MAG-OX) 400 mg Oral Tablet    Take 1 Tablet (400 mg total) by mouth Twice daily for 30 days    methylnaltrexone (RELISTOR) 150 mg Oral Tablet    Relistor 150 mg tablet  TAKE 3 TABLETS BY MOUTH DAILY  metoprolol succinate (TOPROL-XL) 50 mg Oral Tablet Sustained Release 24 hr    Take 1 Tablet (50 mg total) by mouth Once a day    NANO PEN NEEDLE 32 gauge x 5/32" Needle    USE WITH INSULIN FLEXPEN BID AS NEEDED    omeprazole (PRILOSEC) 40 mg Oral Capsule, Delayed Release(E.C.)    Take 1 Cap (40 mg total) by mouth Once a day    pancreatic enzyme replacement (CREON) 12,000-38,000 -60,000 unit Oral Capsule, Delayed Release(E.C.)    Take 2 Capsules by mouth Three times daily with meals 2 CAPS WITH MEALS AND 1 CAP WITH EACH SNACK    sodium chloride 1 gram Oral Tablet    Take 1  Tablet (1 g total) by mouth Three times daily with meals for 30 days    tiotropium bromide (SPIRIVA HANDIHALER) 18 mcg Inhalation Capsule, w/Inhalation Device    Take 1 Capsule (18 mcg total) by inhalation Once a day for 180 days    trimethoprim (TRIMPEX) 100 mg Oral Tablet    Take 1 Tablet (100 mg total) by mouth Twice daily for 30 days           Allergies   Allergen Reactions    Sulfa (Sulfonamides) Itching        Advanced Directives:  Code Status Information       Code Status    No CPR              Review of Systems   Constitutional: Negative for chills, diaphoresis and fever.   HENT: Negative for congestion, ear pain and sore throat.    Eyes: Negative.    Respiratory: Negative for cough, hemoptysis, sputum production, shortness of breath and wheezing.    Cardiovascular: Positive for chest pain. Negative for palpitations, orthopnea and leg swelling.   Gastrointestinal: Negative for abdominal pain, constipation, diarrhea, heartburn, nausea and vomiting.   Genitourinary: Negative for dysuria.   Musculoskeletal: Negative.    Skin: Negative for rash.   Neurological: Negative for dizziness and headaches.   Psychiatric/Behavioral: The patient does not have insomnia.           Filed Vitals:    03/31/20 1645 03/31/20 1713 03/31/20 1715 03/31/20 1743   BP:  (!) 179/69 (!) 176/63 (!) 179/59   Pulse: 75 74 72 69   Resp: 16 (!) 23 19 16    Temp:    36.8 C (98.2 F)   SpO2: (!) 82% (!) 88% 99%        Physical Exam  Vitals and nursing note reviewed.   Constitutional:       General: She is not in acute distress.     Appearance: She is not diaphoretic.   HENT:      Head: Normocephalic and atraumatic.      Right Ear: External ear normal.      Left Ear: External ear normal.      Nose: Nose normal.   Eyes:      General: No scleral icterus.     Pupils: Pupils are equal, round, and reactive to light.   Cardiovascular:      Rate and Rhythm: Normal rate and regular rhythm.      Heart sounds: Normal heart sounds. No murmur heard.   No  friction rub. No gallop.    Pulmonary:      Effort: Pulmonary effort is normal. No respiratory distress.      Breath sounds: Normal breath sounds. No wheezing or rales.   Chest:  Chest wall: No tenderness.   Abdominal:      General: Bowel sounds are normal. There is no distension.      Palpations: Abdomen is soft. There is no mass.      Tenderness: There is no abdominal tenderness. There is no guarding or rebound.   Musculoskeletal:         General: Swelling (Patient has 2+ pitting edema to her knees bilaterally.) present. Normal range of motion.      Cervical back: Normal range of motion and neck supple.   Skin:     General: Skin is warm and dry.      Findings: No erythema.   Neurological:      Mental Status: She is alert and oriented to person, place, and time.      Cranial Nerves: No cranial nerve deficit.   Psychiatric:         Mood and Affect: Mood and affect normal.          Laboratory Data:     Admission on 03/31/2020   Component Date Value    PROTHROMBIN TIME 03/31/2020 11.3     INR 03/31/2020 1.10     SODIUM 03/31/2020 131*    POTASSIUM 03/31/2020 4.6     CHLORIDE 03/31/2020 99     CO2 TOTAL 03/31/2020 24     ANION GAP 03/31/2020 8     BUN 03/31/2020 16     CREATININE 03/31/2020 1.30*    BUN/CREA RATIO 03/31/2020 12     ESTIMATED GFR 03/31/2020 41*    ALBUMIN 03/31/2020 2.6*    CALCIUM 03/31/2020 7.8*    GLUCOSE 03/31/2020 271*    ALKALINE PHOSPHATASE 03/31/2020 109     ALT (SGPT) 03/31/2020 17     AST (SGOT)  03/31/2020 29     BILIRUBIN TOTAL 03/31/2020 0.3     PROTEIN TOTAL 03/31/2020 4.7*    TROPONIN I 03/31/2020 33*    WBC 03/31/2020 4.1     RBC 03/31/2020 3.33*    HGB 03/31/2020 10.0*    HCT 03/31/2020 31.9*    MCV 03/31/2020 95.8     MCH 03/31/2020 30.0     MCHC 03/31/2020 31.3     RDW-CV 03/31/2020 14.3     PLATELETS 03/31/2020 130*    MPV 03/31/2020 10.9     NEUTROPHIL % 03/31/2020 57     LYMPHOCYTE % 03/31/2020 33     MONOCYTE % 03/31/2020 7     EOSINOPHIL % 03/31/2020 2     BASOPHIL %  03/31/2020 1     NEUTROPHIL # 03/31/2020 2.32     LYMPHOCYTE # 03/31/2020 1.37     MONOCYTE # 03/31/2020 0.30     EOSINOPHIL # 03/31/2020 <0.10     BASOPHIL # 03/31/2020 <0.10     IMMATURE GRANULOCYTE % 03/31/2020 0     IMMATURE GRANULOCYTE # 03/31/2020 <0.10     APTT 03/31/2020 25.7     SARS-CoV-2 03/31/2020 Not Detected     INFLUENZA VIRUS TYPE A 03/31/2020 Not Detected     INFLUENZA VIRUS TYPE B 03/31/2020 Not Detected     RESPIRATORY SYNCTIAL VIR* 03/31/2020 Not Detected     TROPONIN I 03/31/2020 34*        Imaging Studies:    CT ANGIO CHEST FOR PULMONARY EMBOLUS W IV CONTRAST   Final Result by Edi, Radresults In (04/20 1551)   Bilateral pleural effusions more marked on the left.      Irregular airspace disease right base representing  pneumonia and/or   atelectasis. Follow-up until clear advised to exclude malignancy.      Negative for pulmonary embolism. Negative for aortic dissection.      Chronic anasarca chest and upper abdomen.      Ascites within the abdomen. Rule out cirrhosis.      Negative for pneumothorax.      There is a small hiatal hernia.         The CT exam was performed using one or more the following a dose reduction   techniques: Automated exposure control, adjustment of the mA and/or kV   according to the patient's size, or use of iterative reconstruction   technique.      This note was partially created using voice recognition software and is   inherently subject to errors including those of syntax and "sound alike "   substitutions which may escape proof reading.  In such instances, original   meaning may be extrapolated by contextual derivation.         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  Chest pain, rule out MI. Patient was placed under hospitalist service.  Patient will be monitored with troponins every 8 hours for 3 total sets.  Patient will have telemetry.  This be closely monitor.  Repeat labs in the morning.  Repeat chest x-ray.  On CTA of the chest today patient was found to have potentially bibasilar pneumonia versus atelectasis.  She was started on Zosyn.  This will be continued.  Patient will have medication renally dosed.  Second etiology for findings was possible malignancy.  Patient has known metastatic lung cancer from her primary pancreatic cancer.  2. COPD.  Chronic stable.  3. Essential hypertension.  Chronic stable.  Continue home medications.  4. SIADH.  Continue home medications.  Closely monitor sodium.  Patient may need fluid restrictions if sodium decline.  5. Type 2 diabetes.  Accu-Cheks q.a.c. and HS with house sliding scale insulin.  6. DVT prophylaxis.  Lovenox subcu daily.      Estimated Length of Stay:  1-2 days    Aftan Crews, PA-C    I saw and evaluated the patient with the advanced practice provider.  I reviewed the practitioner's note.  I agree with the findings and plan of care as documented in the note.  My findings are patient admitted to the hospital for chest pain rule out MI.  Will repeat chest x-ray and labs in morning.  Will monitor on telemetry.  Will order serial cardiac enzymes q.8 hours for a total 3 sets.  Will start IV antibiotics for questionable bibasilar pneumonia.  Will continue to follow.    Bethanie Dicker, MD  03/31/2020, 20:57

## 2020-03-31 NOTE — Care Management Notes (Signed)
Referral Information  ++++++ Placed Provider #1 ++++++  Case Manager: Carla Johnson  Provider Type: Home Health - Return  Provider Name: [facility]  Address:  [address]  [place],  [address]  Contact: Tracy McClure    Phone: [phone] x  Fax:   Fax: [phone]

## 2020-03-31 NOTE — Care Plan (Signed)
Problem: Adult Inpatient Plan of Care  Goal: Plan of Care Review  Outcome: Ongoing (see interventions/notes)  Goal: Patient-Specific Goal (Individualized)  Outcome: Ongoing (see interventions/notes)  Goal: Absence of Hospital-Acquired Illness or Injury  Outcome: Ongoing (see interventions/notes)  Goal: Optimal Comfort and Wellbeing  Outcome: Ongoing (see interventions/notes)  Goal: Rounds/Family Conference  Outcome: Ongoing (see interventions/notes)     Problem: Fall Injury Risk  Goal: Absence of Fall and Fall-Related Injury  Outcome: Ongoing (see interventions/notes)     Problem: Fluid Imbalance (Pneumonia)  Goal: Fluid Balance  Outcome: Ongoing (see interventions/notes)     Problem: Infection (Pneumonia)  Goal: Resolution of Infection Signs and Symptoms  Outcome: Ongoing (see interventions/notes)     Problem: Respiratory Compromise (Pneumonia)  Goal: Effective Oxygenation and Ventilation  Outcome: Ongoing (see interventions/notes)   Admission to [facility]

## 2020-03-31 NOTE — ED Nurses Note (Signed)
Attempted to call report. Nurse to return call

## 2020-03-31 NOTE — ED Triage Notes (Signed)
Mid sternum chest pain started about 30 min ago. Pt states she was sitting in her chair talking. No ASA today

## 2020-03-31 NOTE — Nurses Notes (Signed)
Patient arrived to floor via stretcher. Oriented to room and call bell. IV access patent to left AC. Reviewed safety precautions and patient verbalizes understanding. Call bell within reach. RN completing initial assessment.

## 2020-03-31 NOTE — ED Provider Notes (Signed)
[facility]  Emergency Department  Provider Note      Cove Igleheart  Jun 14, 1946  74 y.o.  female  [place] [address]   [phone] (home)  Myrene Buddy, MD    Chief Complaint:   Chief Complaint   Patient presents with    Chest Pain        HPI: This is a 74 y.o. female who presents to the emergency department via POV with complaints of left-sided chest pain that started approximately 30 minutes ago.  She states she was sitting in her chair talking to of physical therapy nurse and her family when the sharp chest pain started.  It was an 8/10.  It is currently a 2/10.  There is no alleviating or exacerbating factors.  She does have some shortness of breath and a cough but the cough is nonproductive.  Patient does not have a history of coronary artery disease.  She does not have any stents or bypasses.  She has not had a recent stress test nor catheterization.  Patient does however have a recent admission from 03/26/2020 through 03/28/2020 for altered mental status, hypothermia and hypoglycemia.  She is a insulin dependent type 2 diabetic.    COVID screening:  Patient denies shortness of breath, cough, and lost of taste or smell.  Patient has had no recent travel or known COVID exposures.    Past Medical History:   Past Medical History:   Diagnosis Date    Abdominal hernia 10/18/2017    hx of repair    Anxiety     Arthritis     Asthma     Atrial fibrillation (CMS HCC)     Back problem     Bruises easily     Cancer (CMS [place]) 10/18/2017    chemo and radiation completed 09/24/2017    COPD (chronic obstructive pulmonary disease) (CMS HCC)     CPAP (continuous positive airway pressure) dependence 10/18/2017    has not used recently    Depression     DM (diabetes mellitus) (CMS [place]) 2000    Fasting BG 200's. HGA1C 2018 6    Edema     Essential hypertension     GERD (gastroesophageal reflux disease)     Headache     Heartburn     Hx antineoplastic chemo 2018    Hx of radiation  therapy 2018    Hypercholesteremia     Hyperlipidemia     Migraine 10/18/2017    none recently    Obesity     Palpitations     Pancreatic cancer (CMS [place]) 03/2017    Panic attack     PE (pulmonary thromboembolism) (CMS HCC) 03/29/2018    Peripheral neuropathy 10/18/2017    knees down, bilateral    Sepsis (CMS [place]) 04/15/2019    Sleep apnea     Type 2 diabetes mellitus (CMS [place]) 10/18/2017    Dx 2000 FBS 200s    Unintentional weight loss 10/18/2017    25 lbs 03/2017    UTI (urinary tract infection)     Wears glasses        Past Surgical History:   Past Surgical History:   Procedure Laterality Date    Cesarean section      Colonoscopy  10/04/2019    Hx cesarean section      Hx cholecystectomy      Hx hernia repair      Hx subclavian port implantion      Hx  subclavian port implantion      Hx tonsil and adenoidectomy      Hx tonsillectomy      Pancreaticoduodenectomy  [id number]    Umbilical hernia repair      Whipple procedure w/ laparoscopy         Social History:   Social History     Tobacco Use    Smoking status: Former Smoker     Packs/day: 0.50     Years: 20.00     Pack years: 10.00     Types: Cigarettes    Smokeless tobacco: Never Used    Tobacco comment: unsure of quit date   Vaping Use    Vaping Use: Never used   Substance Use Topics    Alcohol use: Never    Drug use: Never      Social History     Substance and Sexual Activity   Drug Use Never       Allergies:   Allergies   Allergen Reactions    Sulfa (Sulfonamides) Itching       Medications:   Prior to Admission medications    Medication Sig Start Date End Date Taking? Authorizing Provider   Acetaminophen-Butalbital 50-325 mg Oral Tablet Take by mouth    Provider, Historical   aspirin (ECOTRIN) 81 mg Oral Tablet, Delayed Release (E.C.) Take 1 Tab (81 mg total) by mouth Once a day 04/20/19   Hyacinth Meeker, MD   atorvastatin (LIPITOR) 10 mg Oral Tablet TAKE 1 TABLET BY MOUTH ONCE DAILY 01/13/20   Ignacia Felling, MD   cetirizine  (ZYRTEC) 10 mg Oral Tablet Take 1 Tab (10 mg total) by mouth Once a day 04/20/19   Hyacinth Meeker, MD   dilTIAZem (CARDIZEM) 30 mg Oral Tablet Take 1 Tablet (30 mg total) by mouth Four times a day for 30 days 03/02/20 04/01/20  Myrene Buddy, MD   furosemide (LASIX) 80 mg Oral Tablet Take 1 Tablet (80 mg total) by mouth Once a day for 90 days 03/23/20 06/21/20  Myrene Buddy, MD   hydrALAZINE (APRESOLINE) 50 mg Oral Tablet Take 0.5 Tablets (25 mg total) by mouth Twice daily for 30 days 02/11/20 03/12/20  Myrene Buddy, MD   HYDROcodone-acetaminophen (NORCO) 7.5-325 mg Oral Tablet TAKE 1 TABLET BY MOUTH EVERY 6 HOURS AS NEEDED FOR PAIN 02/19/20   Provider, Historical   insulin aspart U-100 (NOVOLOG FLEXPEN U-100 INSULIN) 100 unit/mL (3 mL) Subcutaneous Insulin Pen 7 Units by Subcutaneous route Twice a day before meals 03/26/20   Myrene Buddy, MD   insulin glargine (LANTUS SOLOSTAR U-100 INSULIN) 100 unit/mL Subcutaneous Insulin Pen 10 Units by Subcutaneous route Every night 11/25/19   Myrene Buddy, MD   lidocaine HCL (XYLOCAINE) 2 % Mucous Membrane Solution 5 mL 05/02/17   Provider, Historical   LORazepam (ATIVAN) 0.5 mg Oral Tablet Take 1 Tablet (0.5 mg total) by mouth Every evening 03/02/20   Myrene Buddy, MD   losartan (COZAAR) 100 mg Oral Tablet Take 1 Tablet (100 mg total) by mouth Once a day for 90 days 03/23/20 06/21/20  Myrene Buddy, MD   magnesium oxide (MAG-OX) 400 mg Oral Tablet Take 1 Tablet (400 mg total) by mouth Twice daily for 30 days 03/02/20 04/01/20  Myrene Buddy, MD   methylnaltrexone (RELISTOR) 150 mg Oral Tablet Relistor 150 mg tablet  TAKE 3 TABLETS BY MOUTH DAILY 03/02/20   Myrene Buddy, MD   metoprolol succinate (TOPROL-XL) 50 mg  Oral Tablet Sustained Release 24 hr Take 1 Tablet (50 mg total) by mouth Once a day 02/07/20   Bethanie Dicker, MD   NANO PEN NEEDLE 32 gauge x 5/32" Needle USE WITH INSULIN FLEXPEN BID AS NEEDED 08/06/19   Provider, Historical   omeprazole (PRILOSEC) 40 mg Oral  Capsule, Delayed Release(E.C.) Take 1 Cap (40 mg total) by mouth Once a day 02/07/18   Nicola Police, MD   pancreatic enzyme replacement (CREON) 12,000-38,000 -60,000 unit Oral Capsule, Delayed Release(E.C.) Take 2 Capsules by mouth Three times daily with meals 2 CAPS WITH MEALS AND 1 CAP WITH EACH [facility] 02/17/20   Myrene Buddy, MD   sodium chloride 1 gram Oral Tablet Take 1 Tablet (1 g total) by mouth Three times daily with meals for 30 days 03/02/20 04/01/20  Myrene Buddy, MD   tiotropium bromide (SPIRIVA HANDIHALER) 18 mcg Inhalation Capsule, w/Inhalation Device Take 1 Capsule (18 mcg total) by inhalation Once a day for 180 days 02/24/20 08/22/20  Myrene Buddy, MD   trimethoprim (TRIMPEX) 100 mg Oral Tablet Take 1 Tablet (100 mg total) by mouth Twice daily for 30 days 03/24/20 04/23/20  Myrene Buddy, MD   spironolactone (ALDACTONE) 25 mg Oral Tablet Take 1 Tablet (25 mg total) by mouth Once a day for 180 days 02/11/20 02/27/20  Myrene Buddy, MD       Above history, allergies and medication list reviewed with patient.      Review of Systems:  Review of systems as below.  Additional systems reviewed in HPI.   Consitutional:  Negative for fever, chills and fatigue/malaise.  Skin:  Negative for rashes or other lesions.  HENT:  Negative for headaches.  Eyes:  Negative for blurry vision, double vision or acute vision loss.  Cardiovascular:  Positive for chest pain and negative for palpitations.  Respiratory:  Positive for cough, shortness of breath and negative for wheezing.  Gastrointestinal:  Negative for nausea, vomiting and abdominal pain.  Negative for diarrhea, constipation, melena or hematochezia.  Musculoskeletal:  Negative for neck pain and back pain.  Neurological:  Negative for dizziness, loss of consciousness, numbness and tingling. No acute localized weakness.  Psych:  Negative for anxiety or depression.  No suicidal ideations or attempts.  No homicidal ideations or attempts.  Genitourinary:  Negative for  dysuria, hematuria and flank pain.  All other systems reviewed and negative unless documented otherwise.      Physical Exam  Body mass index is 35.94 kg/m.  ED Triage Vitals   BP (Non-Invasive) 03/31/20 1312 (!) 140/58   Heart Rate 03/31/20 1312 (!) 18   Respiratory Rate 03/31/20 1312 (!) 23   Temperature 03/31/20 1312 37.8 C (100 F)   SpO2 03/31/20 1312 98 %   Weight 03/31/20 1311 87.5 kg (193 lb)   Height 03/31/20 1311 1.575 m (5\' 2" )     Constitutional: patient is oriented to person, place, and time and well-developed, well-nourished, and in no distress.  Patient was nontoxic on exam.  She was lying on the exam table.  HENT:   Mask in place.  Head: Normocephalic and atraumatic.   Right Ear: External ear normal.   Left Ear: External ear normal.   Nose: Nose normal.   Mouth/Throat: Oropharynx is clear and moist. Uvula is midline. Airway is patent.  Eyes: Pupils are equal, round, and reactive to light. Conjunctivae and EOM are normal.   Neck: Normal range of motion. Neck supple.  Trachea midline.  Cardiovascular: Normal rate, regular rhythm, normal heart sounds and intact distal pulses.   Pulmonary/Chest: Effort normal and breath sounds normal.  No chest wall deformity.  Abdominal: Non-tender. Soft. No ascites. Bowel sounds are normal.  No rebound tenderness or guarding.  Musculoskeletal: Normal range of motion for patient's baseline.  Neurological: Patient is alert and oriented to person, place, and time. GCS score is 15.   Skin: Skin is warm and dry.   Psychiatric: Mood, memory, affect and judgment normal.        ED Course/ MDM/ Plan:   Patient was triaged, vital signs were obtained, patient was  placed in a room.  On exam, patient is alert and oriented, nontoxic on exam, and in no acute distress. Vitals were reviewed. The following orders were placed after examining the patient and the following results were reviewed by me.    Orders Placed This Encounter    CANCELED: XR AP MOBILE CHEST    CT ANGIO CHEST FOR  PULMONARY EMBOLUS W IV CONTRAST    CANCELED: XR AP MOBILE CHEST    CBC/DIFF    PT/INR    COMPREHENSIVE METABOLIC PANEL, NON-FASTING    TROPONIN-I    CBC WITH DIFF    PTT (PARTIAL THROMBOPLASTIN TIME)    COVID-19 SCREENING INCLUDING FLU A/B, RSV RAPID BY PCR    TROPONIN-I    CBC/DIFF    COMPREHENSIVE METABOLIC PANEL, NON-FASTING    CANCELED: TROPONIN-I    LIPID PANEL    CBC WITH DIFF    TROPONIN-I    TROPONIN-I    TROPONIN-I    ECG 12-LEAD    CANCELED: PERFORM POC WHOLE BLOOD GLUCOSE    CANCELED: INSERT & MAINTAIN PERIPHERAL IV ACCESS    CANCELED: INSERT & MAINTAIN PERIPHERAL IV ACCESS    CANCELED: PERIPHERAL IV DRESSING CHANGE EVERY 96 HOURS AND PRN    PATIENT CLASS/LEVEL OF CARE DESIGNATION - SMR    FOLLOW-UP: FAMILY MEDICINE - [facility] - [place], [place]    aspirin chewable tablet 324 mg    NS bolus infusion 500 mL    iodixanol (VISIPAQUE 320) infusion    magnesium oxide (MAG-OX) tablet    cefdinir (OMNICEF) 300 mg Oral Capsule    doxycycline hyclate (VIBRAMYCIN) 100 mg Oral Capsule     XR AP MOBILE CHEST   Final Result   Suspicious for pneumonia developing in the left base.      This note was partially created using voice recognition software and is   inherently subject to errors including those of syntax and "sound alike "   substitutions which may escape proof reading.  In such instances, original   meaning may be extrapolated by contextual derivation.         Radiologist location ID: [id number]         CT ANGIO CHEST FOR PULMONARY EMBOLUS W IV CONTRAST   Final Result   Bilateral pleural effusions more marked on the left.      Irregular airspace disease right base representing pneumonia and/or   atelectasis. Follow-up until clear advised to exclude malignancy.      Negative for pulmonary embolism. Negative for aortic dissection.      Chronic anasarca chest and upper abdomen.      Ascites within the abdomen. Rule out cirrhosis.      Negative for pneumothorax.         There is a small hiatal hernia.         The CT exam was performed  using one or more the following a dose reduction   techniques: Automated exposure control, adjustment of the mA and/or kV   according to the patient's size, or use of iterative reconstruction   technique.      This note was partially created using voice recognition software and is   inherently subject to errors including those of syntax and "sound alike "   substitutions which may escape proof reading.  In such instances, original   meaning may be extrapolated by contextual derivation.         Radiologist location ID: [id number]           Labs Reviewed   COMPREHENSIVE METABOLIC PANEL, NON-FASTING - Abnormal; Notable for the following components:       Result Value    SODIUM 131 (*)     CREATININE 1.30 (*)     ESTIMATED GFR 41 (*)     ALBUMIN 2.6 (*)     CALCIUM 7.8 (*)     GLUCOSE 271 (*)     PROTEIN TOTAL 4.7 (*)     All other components within normal limits   TROPONIN-I - Abnormal; Notable for the following components:    TROPONIN I 33 (*)     All other components within normal limits   CBC WITH DIFF - Abnormal; Notable for the following components:    RBC 3.33 (*)     HGB 10.0 (*)     HCT 31.9 (*)     PLATELETS 130 (*)     All other components within normal limits   PT/INR - Normal   PTT (PARTIAL THROMBOPLASTIN TIME) - Normal   COVID-19, FLU A/B, RSV RAPID BY PCR - Normal    Narrative:     Results are for the simultaneous qualitative identification of SARS-CoV-2 (formerly 2019-nCoV), Influenza A, Influenza B, and RSV RNA. These etiologic agents are generally detectable in nasopharyngeal and nasal swabs during the ACUTE PHASE of infection. Hence, this test is intended to be performed on respiratory specimens collected from individuals with signs and symptoms of upper respiratory tract infection who meet Centers for Disease Control and Prevention (CDC) clinical and/or epidemiological criteria for Coronavirus Disease 2019 (COVID-19) testing. CDC  COVID-19 criteria for testing on human specimens is available at [facility] webpage information for Healthcare Professionals: Coronavirus Disease 2019 (COVID-19) ([employer]).     False-negative results may occur if the virus has genomic mutations, insertions, deletions, or rearrangements or if performed very early in the course of illness. Otherwise, negative results indicate virus specific RNA targets are not detected, however negative results do not preclude SARS-CoV-2 infection/COVID-19, Influenza, or Respiratory syncytial virus infection. Results should not be used as the sole basis for patient management decisions. Negative results must be combined with clinical observations, patient history, and epidemiological information. If upper respiratory tract infection is still suspected based on exposure history together with other clinical findings, re-testing should be considered.    Disclaimer:   This assay has been authorized by FDA under an Emergency Use Authorization for use in laboratories certified under the Clinical Laboratory Improvement Amendments of 1988 (CLIA), 42 U.S.C. 2208883265, to perform high complexity tests. The impacts of vaccines, antiviral therapeutics, antibiotics, chemotherapeutic or immunosuppressant drugs have not been evaluated.     Test methodology:   Cepheid Xpert Xpress SARS-CoV-2/Flu/RSV Assay real-time polymerase chain reaction (RT-PCR) test on the GeneXpert Dx and Xpert Xpress systems.   CBC/DIFF    Narrative:     The following orders were created for  panel order CBC/DIFF.  Procedure                               Abnormality         Status                     ---------                               -----------         ------                     CBC WITH [id number]                Abnormal            Final result                 Please view results for these tests on the individual orders.     No results found for this or any previous visit  (from the past 12 hour(s)).  CT ANGIO CHEST FOR PULMONARY EMBOLUS W IV CONTRAST   Final Result by Edi, [employer] In (04/20 1551)   Bilateral pleural effusions more marked on the left.      Irregular airspace disease right base representing pneumonia and/or   atelectasis. Follow-up until clear advised to exclude malignancy.      Negative for pulmonary embolism. Negative for aortic dissection.      Chronic anasarca chest and upper abdomen.      Ascites within the abdomen. Rule out cirrhosis.      Negative for pneumothorax.      There is a small hiatal hernia.         The CT exam was performed using one or more the following a dose reduction   techniques: Automated exposure control, adjustment of the mA and/or kV   according to the patient's size, or use of iterative reconstruction   technique.      This note was partially created using voice recognition software and is   inherently subject to errors including those of syntax and "sound alike "   substitutions which may escape proof reading.  In such instances, original   meaning may be extrapolated by contextual derivation.         Radiologist location ID: [id number]               The patient received the following medications while in the ED (also noted above):  Medications   aspirin chewable tablet 324 mg (324 mg Oral Given 03/31/20 1351)   NS bolus infusion 500 mL (0 mL Intravenous Stopped 03/31/20 1421)   iodixanol (VISIPAQUE 320) infusion (100 mL Intravenous Given 03/31/20 1539)       ED Course as of Apr 07 1412   Tue Mar 31, 2020   1354 Hemoglobin is low at 10.  Hematocrit low at 31.9.  Platelets at 130.    CBC/DIFF(!) [CT]   1355 Low   SODIUM(!): 131 [CT]   1355 Elevated   CREATININE(!): 1.30 [CT]   1355 Low   ALBUMIN(!): 2.6 [CT]   1355 Low   CALCIUM(!): 7.8 [CT]   1355 Elevate   GLUCOSE(!): 271 [CT]   1355 Low   TOTAL PROTEIN(!): 4.7 [CT]   1421 Elevated. Patient has been given asa. Will see results of  cta[place](!!): 33 [CT]   1429 wnl   PT/INR [CT]   1429  wnl   aPTT: 25.7 [CT]   1455 Negative    COVID-19 SCREENING INCLUDING FLU A/B, RSV RAPID BY PCR [CT]      ED Course User Index  [CT] Thayer-Hughes, Tylin Stradley, PA-C     EKG shows sinus rhythm with first-degree AV block with occasional PVCs.  77 beats per minute.  No acute ST elevation, depression or STEMI.  Normal axis.    Procedures  None    This is a 74 year old female presenting to the ER with complaints of sudden onset chest pain.  Her cardiac workup in the ER was unremarkable but due to the recent onset, he was felt the patient would benefit from further cardiac evaluation.  I discussed the patient with the hospitalist.  Hospitalist will admit the patient.  I discussed treatment plan with the patient she is in agreement.  Patient remained hemodynamically stable during her ED course.    Critical Care:     Not applicable.      Clinical Impression:   Encounter Diagnoses   Name Primary?    Chest pain, unspecified type Yes    Pneumonia of both lower lobes due to infectious organism        Patient admitted for further evaluation    Disposition: Admitted  Discharge Medication List as of 04/01/2020  4:15 PM      START taking these medications    Details   cefdinir (OMNICEF) 300 mg Oral Capsule Take 1 Capsule (300 mg total) by mouth Twice daily for 10 days, Disp-20 Capsule, R-0, E-Rx      doxycycline hyclate (VIBRAMYCIN) 100 mg Oral Capsule Take 1 Capsule (100 mg total) by mouth Twice daily for 10 days, Disp-20 Capsule, R-0, E-Rx            Family Medicine, [facility]  [place] [id number]  [phone]         BP (!) 165/56    Pulse 72    Temp 36.2 C (97.2 F)    Resp 20    Ht 1.575 m (5\' 2" )    Wt 89.1 kg (196 lb 8 oz)    SpO2 95%    BMI 35.94 kg/m          Alexzander Dolinger Thayer-Hughes, PA-C       This note was partially created using voice recognition software and is inherently subject to errors including those of syntax and "sound alike " substitutions which may escape  proof reading.  In such instances, original meaning may be extrapolated by contextual derivation.

## 2020-03-31 NOTE — ED Nurses Note (Signed)
Report given to Katie

## 2020-04-01 ENCOUNTER — Observation Stay (HOSPITAL_COMMUNITY)

## 2020-04-01 LAB — COMPREHENSIVE METABOLIC PANEL, NON-FASTING
ALBUMIN: 2.2 g/dL — ABNORMAL LOW (ref 3.4–4.8)
ALKALINE PHOSPHATASE: 92 U/L (ref 55–145)
ALT (SGPT): 11 U/L (ref 8–22)
ANION GAP: 5 mmol/L (ref 4–13)
AST (SGOT): 18 U/L (ref 8–45)
BILIRUBIN TOTAL: 0.2 mg/dL — ABNORMAL LOW (ref 0.3–1.3)
BUN/CREA RATIO: 10 (ref 6–22)
BUN: 14 mg/dL (ref 8–25)
CALCIUM: 8 mg/dL — ABNORMAL LOW (ref 8.8–10.2)
CHLORIDE: 103 mmol/L (ref 96–111)
CO2 TOTAL: 24 mmol/L (ref 23–31)
CREATININE: 1.43 mg/dL — ABNORMAL HIGH (ref 0.60–1.05)
ESTIMATED GFR: 37 mL/min/BSA — ABNORMAL LOW (ref >=60)
GLUCOSE: 200 mg/dL — ABNORMAL HIGH (ref 65–125)
POTASSIUM: 4.4 mmol/L (ref 3.5–5.1)
PROTEIN TOTAL: 4.3 g/dL — ABNORMAL LOW (ref 6.0–8.0)
SODIUM: 132 mmol/L — ABNORMAL LOW (ref 136–145)

## 2020-04-01 LAB — CBC WITH DIFF
BASOPHIL #: <0.1 10*3/uL (ref <=0.20)
BASOPHIL %: 1 %
EOSINOPHIL #: 0.16 10*3/uL (ref <=0.50)
EOSINOPHIL %: 5 %
HCT: 28.1 % — ABNORMAL LOW (ref 34.8–46.0)
HGB: 8.8 g/dL — ABNORMAL LOW (ref 11.5–16.0)
IMMATURE GRANULOCYTE #: <0.1 10*3/uL (ref <0.10)
IMMATURE GRANULOCYTE %: 0 % (ref 0–1)
LYMPHOCYTE #: 1.28 10*3/uL (ref 1.00–4.80)
LYMPHOCYTE %: 37 %
MCH: 29.8 pg (ref 26.0–32.0)
MCHC: 31.3 g/dL (ref 31.0–35.5)
MCV: 95.3 fL (ref 78.0–100.0)
MONOCYTE #: 0.36 10*3/uL (ref 0.20–1.10)
MONOCYTE %: 10 %
MPV: 11.4 fL (ref 8.7–12.5)
NEUTROPHIL #: 1.59 10*3/uL (ref 1.50–7.70)
NEUTROPHIL %: 47 %
PLATELETS: 106 10*3/uL — ABNORMAL LOW (ref 150–400)
RBC: 2.95 10*6/uL — ABNORMAL LOW (ref 3.85–5.22)
RDW-CV: 14.3 % (ref 11.5–15.5)
WBC: 3.5 10*3/uL — ABNORMAL LOW (ref 3.7–11.0)

## 2020-04-01 LAB — LIPID PANEL
CHOL/HDL RATIO: 1.8
CHOLESTEROL: 106 mg/dL (ref 100–200)
HDL CHOL: 59 mg/dL (ref >=50)
LDL CALC: 35 mg/dL (ref <100)
NON-HDL: 47 mg/dL (ref <=190)
TRIGLYCERIDES: 61 mg/dL (ref <150)
VLDL CALC: 12 mg/dL (ref <30)

## 2020-04-01 LAB — POC BLOOD GLUCOSE (RESULTS)
GLUCOSE, POC: 182 mg/dL — ABNORMAL HIGH (ref 60–110)
GLUCOSE, POC: 211 mg/dL — ABNORMAL HIGH (ref 60–110)

## 2020-04-01 LAB — ECG 12-LEAD
Atrial Rate: 77 {beats}/min
Calculated P Axis: 39 degrees
Calculated R Axis: 10 degrees
Calculated T Axis: 85 degrees
PR Interval: 230 ms
QRS Duration: 86 ms
QT Interval: 392 ms
QTC Calculation: 443 ms
Ventricular rate: 77 {beats}/min

## 2020-04-01 LAB — TROPONIN-I
TROPONIN I: 41 ng/L — ABNORMAL HIGH (ref 7–30)
TROPONIN I: 52 ng/L — ABNORMAL HIGH (ref 7–30)
TROPONIN I: 47 ng/L — ABNORMAL HIGH (ref 7–30)

## 2020-04-01 MED ORDER — DOXYCYCLINE HYCLATE 100 MG CAPSULE
100.00 mg | ORAL_CAPSULE | Freq: Two times a day (BID) | ORAL | 0 refills | Status: AC
Start: 2020-04-01 — End: 2020-04-11

## 2020-04-01 MED ORDER — HEPARIN, PORCINE (PF) 100 UNIT/ML INTRAVENOUS SYRINGE
5.0000 mL | INJECTION | Freq: Once | INTRAVENOUS | Status: DC
Start: 2020-04-01 — End: 2020-04-01
  Filled 2020-04-01: qty 5

## 2020-04-01 MED ORDER — CEFDINIR 300 MG CAPSULE
300.00 mg | ORAL_CAPSULE | Freq: Two times a day (BID) | ORAL | 0 refills | Status: AC
Start: 2020-04-01 — End: 2020-04-11

## 2020-04-01 MED ORDER — ENOXAPARIN 30 MG/0.3 ML SUBCUTANEOUS SYRINGE
30.00 mg | INJECTION | Freq: Two times a day (BID) | SUBCUTANEOUS | Status: DC
Start: 2020-04-01 — End: 2020-04-01
  Administered 2020-04-01: 30 mg via SUBCUTANEOUS
  Filled 2020-04-01: qty 0.3

## 2020-04-01 NOTE — Care Management Notes (Signed)
04/01/20 1549   Assessment Details   Assessment Type Admission   Date of Care Management Update 04/01/20   Readmission   Is this a readmission? Yes   Number of days between last admission and this admission? 4   Were your symptoms the same as before? no   Did your support systems work?  Yes   Are you repsonsible for setting up/taking your own medications? Is this working?    (has assistance with medications from family)   Did you call your [place] Provider  No   Were you able to attend your hospital follow up? No   No, not able to attend hospital follow up appointments only out of hospital for 4 days   If you had d/c resources set up proir to discharge what were they, and were you seen by them? Yes   D/C resources set up prior to discharge [facility]   Did you have any barriers for a succesful discharge? Cost of meds, food, transportation No   Care Management Plan   Discharge Planning Status initial meeting   Projected Discharge Date 04/01/20   Discharge plan discussed with: Patient   CM will evaluate for rehabilitation potential no   Facility or Agency Preferences [facility]   Discharge Needs Assessment   Outpatient/Agency/Support Group Needs homecare agency   Was referral sent to [facility]PS? No   Equipment Currently Used at [employer] (see comments)  (rollator)   Equipment Needed After Discharge none   Discharge Facility/Level of Care Needs Home with Home Health (code 6)   Transportation Available car;family or friend will provide   Referral Information   Admission Type observation   Address Verified verified-no changes   Arrived From home healthcare service   [place] Verified verified-no change   Observation Form   Observation Form Given MOON form given   MOON form explained/reviewed with:  Patient   MOON form date of delivery 03/31/20   MOON form time of delivery 1659   ADVANCE DIRECTIVES   Does the Patient have an  Advance Directive? Yes, Patient Does Have Advance Directive for Healthcare Treatment   Type of Advance Directive Completed Medical Power of Attorney   Copy of Advance Directives in Chart? 6   Name of MPOA or [place]   Phone Number of [place] or Healthcare Surrogate [phone]   Patient Requests Assistance in Having Advance Directive Notarized. N/A   LAY CAREGIVER    Appointed Lay Caregiver? I Decline   Employment/Financial   Patient has Prescription Coverage?  Yes        Name of Insurance Coverage for Medications [employer]   Financial Concerns none   Living Environment   Select an age group to open "lives with" row.  Adult   Lives With child(ren), adult  (son and daughter in law)   Living Arrangements house   Able to Return to Prior Arrangements yes   Home Safety   Home Assessment: No Problems Identified   Home Accessibility no concerns;bed and bath on same level;stairs to enter home   Legal Issues   Do you have a court appointed guardian/conservator? No   Patient Hand-Off   Clinical/Discharge Plan of Care Information Communicated to:  Clinical Care Coordinator   Home Main Entrance   Number of Stairs, Main Entrance six

## 2020-04-01 NOTE — Care Plan (Signed)
Problem: Adult Inpatient Plan of Care  Goal: Optimal Comfort and Wellbeing  Outcome: Ongoing (see interventions/notes)     Problem: Fall Injury Risk  Goal: Absence of Fall and Fall-Related Injury  Outcome: Ongoing (see interventions/notes)     Problem: Fluid Imbalance (Pneumonia)  Goal: Fluid Balance  Outcome: Ongoing (see interventions/notes)     Problem: Respiratory Compromise (Pneumonia)  Goal: Effective Oxygenation and Ventilation  Outcome: Ongoing (see interventions/notes)

## 2020-04-01 NOTE — Nurses Notes (Signed)
Wheeled out in stable condition. Left with family. Jamse Mead, RN  04/01/2020, 17:11

## 2020-04-01 NOTE — Discharge Summary (Signed)
[facility]  DISCHARGE SUMMARY      PATIENT NAME:  [name], [name]  MRN:  [id number]  DOB:  Feb 24, 1946    INPATIENT ADMISSION DATE: 03/31/2020  DISCHARGE DATE:  04/01/2020    ATTENDING PHYSICIAN: Bethanie Dicker, MD  SERVICE: [facility] HOSPITALIST  PRIMARY CARE PHYSICIAN: Myrene Buddy, MD       DISCHARGE DIAGNOSIS:     Active Hospital Problems    Diagnosis Date Noted    Principle Problem: Chest pain [R07.9] 03/31/2020    COPD (chronic obstructive pulmonary disease) (CMS HCC) [J44.9] 04/13/2019    Essential hypertension [I10] 04/13/2019    CHF (congestive heart failure) (CMS HCC) [I50.9] 04/13/2019    SIADH (syndrome of inappropriate ADH production) (CMS [place]) [E22.2] 04/13/2019    Hyponatremia [E87.1] 10/24/2018    Pancreatic cancer (CMS [place]) [C25.9] 10/25/2017    Asthma [J45.909] 01/24/2017    Type 2 diabetes mellitus with diabetic polyneuropathy, with long-term current use of insulin (CMS HCC) [E11.42, Z79.4] 01/24/2017      Resolved Hospital Problems   No resolved problems to display.             REASON FOR HOSPITALIZATION AND HOSPITAL COURSE:    This is a 74 y.o., female who is admitted for chest pain, rule out MI.  Patient's son is at bedside.  He reports patient complained of some Chest pain last night.  Patient had a sudden onset of sharp chest pain on the left side of her chest was sitting in her chair talking to her physical therapy nurse prior to admission.  Patient complained according to ER note of an 8/10 sharp pain.  After arriving in the emergency room it was currently a 2/10.  Patient did not have any alleviating or exacerbating factors.  Pain did not radiate into her neck or shoulder.  Patient denied nausea, vomiting or shortness of breath.  Patient has no previous history of MI, stents or bypasses.  Patient was recently discharged from our facility on 03/28/2020.  Emergency room patient laboratory studies and diagnostic imaging.  Patient was found to have a troponin of 33.  COVID  test was negative.  Repeat troponin was 34. Patient's labs otherwise were fairly unremarkable.  Patient was placed under hospitalist service as a chest pain rule out.    Patient was kept under hospitalist Service.  Troponins were repeated every 8 hours.  Troponins gradually increased by small amount 1 each lab draw.  Third troponin was 52 with initial troponin being 34. Because of this an additional troponin was drawn 4 hours after the 3rd.  This resulted at 58. Patient is pain-free and feeling much better.  Patient does have some pneumonia appearing on x-ray.  She will be discharged on Omnicef and doxycycline.  Patient's prescriptions were called into [facility] in [place].  Patient is to take medications as prescribed.  She needs to follow-up with PCP in next 1-2 weeks.  Patient needs to be seen sooner for increasing shortness of breath, increasing chest pain, fever, chills or symptoms of concern.  Patient voices understanding and agrees with plan.  Patient discharged home in stable condition.        DISCHARGE MEDICATIONS:     Current Discharge Medication List        START taking these medications.        Details   cefdinir 300 mg Capsule  Commonly known as: OMNICEF   300 mg, Oral, 2 TIMES DAILY  Qty: 20 Capsule  Refills: 0  doxycycline hyclate 100 mg Capsule  Commonly known as: VIBRAMYCIN   100 mg, Oral, 2 TIMES DAILY  Qty: 20 Capsule  Refills: 0            CONTINUE these medications - NO CHANGES were made during your visit.        Details   Acetaminophen-Butalbital 50-325 mg Tablet   Oral  Refills: 0     aspirin 81 mg Tablet, Delayed Release (E.C.)  Commonly known as: ECOTRIN   81 mg, Oral, DAILY  Refills: 0     atorvastatin 10 mg Tablet  Commonly known as: LIPITOR   TAKE 1 TABLET BY MOUTH ONCE DAILY  Qty: 90 Tab  Refills: 1     cetirizine 10 mg Tablet  Commonly known as: ZYRTEC   10 mg, Oral, DAILY  Refills: 0     dilTIAZem 30 mg Tablet  Commonly known as: CARDIZEM   30 mg, Oral, 4 TIMES DAILY  Qty: 120  Tablet  Refills: 5     furosemide 80 mg Tablet  Commonly known as: LASIX   80 mg, Oral, DAILY  Qty: 90 Tablet  Refills: 0     hydrALAZINE 50 mg Tablet  Commonly known as: APRESOLINE   25 mg, Oral, 2 TIMES DAILY  Qty: 30 Tablet  Refills: 5     HYDROcodone-acetaminophen 7.5-325 mg Tablet  Commonly known as: NORCO   TAKE 1 TABLET BY MOUTH EVERY 6 HOURS AS NEEDED FOR PAIN  Refills: 0     insulin aspart U-100 100 unit/mL (3 mL) Insulin Pen  Commonly known as: NovoLOG Flexpen U-100 Insulin   7 Units, Subcutaneous, 2 TIMES DAILY BEFORE MEALS  Qty: 9 Each  Refills: 3     Lantus Solostar U-100 Insulin 100 unit/mL Insulin Pen  Generic drug: insulin glargine   10 Units, Subcutaneous, NIGHTLY  Qty: 3 mL  Refills: 5     lidocaine HCL 2 % Solution  Commonly known as: XYLOCAINE   5 mL  Refills: 0     LORazepam 0.5 mg Tablet  Commonly known as: ATIVAN   0.5 mg, Oral, EVERY EVENING  Qty: 30 Tablet  Refills: 2     losartan 100 mg Tablet  Commonly known as: COZAAR   100 mg, Oral, DAILY  Qty: 90 Tablet  Refills: 0     magnesium oxide 400 mg Tablet  Commonly known as: MAG-OX   400 mg, Oral, 2 TIMES DAILY  Qty: 60 Tablet  Refills: 5     metoprolol succinate 50 mg Tablet Sustained Release 24 hr  Commonly known as: TOPROL-XL   50 mg, Oral, DAILY  Qty: 30 Tablet  Refills: 0     Nano Pen Needle 32 gauge x 5/32" Needle  Generic drug: insulin pen needles   USE WITH INSULIN FLEXPEN BID AS NEEDED  Refills: 0     omeprazole 40 mg Capsule, Delayed Release(E.C.)  Commonly known as: PRILOSEC   40 mg, Oral, DAILY  Qty: 90 Cap  Refills: 4     pancreatic enzyme replacement 12,000-38,000 -60,000 unit Capsule, Delayed Release(E.C.)  Commonly known as: CREON   2 Capsules, Oral, 3 TIMES DAILY WITH MEALS, 2 CAPS WITH MEALS AND 1 CAP WITH EACH SNACK   Qty: 540 Capsule  Refills: 1     Relistor 150 mg Tablet  Generic drug: methylnaltrexone   Relistor 150 mg tablet  TAKE 3 TABLETS BY MOUTH DAILY  Qty: 90 Tablet  Refills: 5     sodium chloride  1 gram Tablet   1  g, Oral, 3 TIMES DAILY WITH MEALS  Qty: 90 Tablet  Refills: 5     tiotropium bromide 18 mcg Capsule, w/Inhalation Device  Commonly known as: SPIRIVA HANDIHALER   18 mcg, Inhalation, DAILY  Qty: 30 Capsule  Refills: 5     trimethoprim 100 mg Tablet  Commonly known as: TRIMPEX   100 mg, Oral, 2 TIMES DAILY  Qty: 60 Tablet  Refills: 5              Filed Vitals:    04/01/20 0733 04/01/20 0746 04/01/20 1100 04/01/20 1500   BP:  (!) 165/59 (!) 178/46 (!) 177/58   Pulse:  71 73 80   Resp:  16 16 16    Temp:  36.8 C (98.2 F) 36.2 C (97.1 F) 36.4 C (97.6 F)   SpO2: 95%         Physical Exam  Vitals and nursing note reviewed.   Constitutional:       General: She is not in acute distress.     Appearance: She is not diaphoretic.   HENT:      Head: Normocephalic and atraumatic.      Right Ear: External ear normal.      Left Ear: External ear normal.      Nose: Nose normal.   Eyes:      General: No scleral icterus.     Pupils: Pupils are equal, round, and reactive to light.   Cardiovascular:      Rate and Rhythm: Normal rate and regular rhythm.      Heart sounds: Normal heart sounds. No murmur heard.   No friction rub. No gallop.    Pulmonary:      Effort: Pulmonary effort is normal. No respiratory distress.      Breath sounds: Normal breath sounds. No wheezing or rales.   Chest:      Chest wall: No tenderness.   Abdominal:      General: Bowel sounds are normal. There is no distension.      Palpations: Abdomen is soft. There is no mass.      Tenderness: There is no abdominal tenderness. There is no guarding or rebound.   Musculoskeletal:         General: Normal range of motion.      Cervical back: Normal range of motion and neck supple.   Skin:     General: Skin is warm and dry.      Findings: No erythema.   Neurological:      Mental Status: She is alert and oriented to person, place, and time.      Cranial Nerves: No cranial nerve deficit.   Psychiatric:         Mood and Affect: Mood and affect normal.         Laboratory  Data:     Admission on 03/31/2020   Component Date Value    Ventricular rate 03/31/2020 77     Atrial Rate 03/31/2020 77     PR Interval 03/31/2020 230     QRS Duration 03/31/2020 86     QT Interval 03/31/2020 392     QTC Calculation 03/31/2020 443     Calculated P Axis 03/31/2020 39     Calculated R Axis 03/31/2020 10     Calculated T Axis 03/31/2020 85     PROTHROMBIN TIME 03/31/2020 11.3     INR 03/31/2020 1.10     SODIUM 03/31/2020 131*  POTASSIUM 03/31/2020 4.6     CHLORIDE 03/31/2020 99     CO2 TOTAL 03/31/2020 24     ANION GAP 03/31/2020 8     BUN 03/31/2020 16     CREATININE 03/31/2020 1.30*    BUN/CREA RATIO 03/31/2020 12     ESTIMATED GFR 03/31/2020 41*    ALBUMIN 03/31/2020 2.6*    CALCIUM 03/31/2020 7.8*    GLUCOSE 03/31/2020 271*    ALKALINE PHOSPHATASE 03/31/2020 109     ALT (SGPT) 03/31/2020 17     AST (SGOT)  03/31/2020 29     BILIRUBIN TOTAL 03/31/2020 0.3     PROTEIN TOTAL 03/31/2020 4.7*    TROPONIN I 03/31/2020 33*    WBC 03/31/2020 4.1     RBC 03/31/2020 3.33*    HGB 03/31/2020 10.0*    HCT 03/31/2020 31.9*    MCV 03/31/2020 95.8     MCH 03/31/2020 30.0     MCHC 03/31/2020 31.3     RDW-CV 03/31/2020 14.3     PLATELETS 03/31/2020 130*    MPV 03/31/2020 10.9     NEUTROPHIL % 03/31/2020 57     LYMPHOCYTE % 03/31/2020 33     MONOCYTE % 03/31/2020 7     EOSINOPHIL % 03/31/2020 2     BASOPHIL % 03/31/2020 1     NEUTROPHIL # 03/31/2020 2.32     LYMPHOCYTE # 03/31/2020 1.37     MONOCYTE # 03/31/2020 0.30     EOSINOPHIL # 03/31/2020 <0.10     BASOPHIL # 03/31/2020 <0.10     IMMATURE GRANULOCYTE % 03/31/2020 0     IMMATURE GRANULOCYTE # 03/31/2020 <0.10     APTT 03/31/2020 25.7     SARS-CoV-2 03/31/2020 Not Detected     INFLUENZA VIRUS TYPE A 03/31/2020 Not Detected     INFLUENZA VIRUS TYPE B 03/31/2020 Not Detected     RESPIRATORY SYNCTIAL VIR* 03/31/2020 Not Detected     TROPONIN I 03/31/2020 34*    GLUCOSE, POC 03/31/2020 309*    SODIUM 04/01/2020 132*    POTASSIUM 04/01/2020 4.4     CHLORIDE  04/01/2020 103     CO2 TOTAL 04/01/2020 24     ANION GAP 04/01/2020 5     BUN 04/01/2020 14     CREATININE 04/01/2020 1.43*    BUN/CREA RATIO 04/01/2020 10     ESTIMATED GFR 04/01/2020 37*    ALBUMIN 04/01/2020 2.2*    CALCIUM 04/01/2020 8.0*    GLUCOSE 04/01/2020 200*    ALKALINE PHOSPHATASE 04/01/2020 92     ALT (SGPT) 04/01/2020 11     AST (SGOT)  04/01/2020 18     BILIRUBIN TOTAL 04/01/2020 0.2*    PROTEIN TOTAL 04/01/2020 4.3*    CHOLESTEROL  04/01/2020 106     HDL CHOL 04/01/2020 59     TRIGLYCERIDES 04/01/2020 61     LDL CALC 04/01/2020 35     VLDL CALC 04/01/2020 12     NON-HDL 04/01/2020 47     CHOL/HDL RATIO 04/01/2020 1.8     WBC 04/01/2020 3.5*    RBC 04/01/2020 2.95*    HGB 04/01/2020 8.8*    HCT 04/01/2020 28.1*    MCV 04/01/2020 95.3     MCH 04/01/2020 29.8     MCHC 04/01/2020 31.3     RDW-CV 04/01/2020 14.3     PLATELETS 04/01/2020 106*    MPV 04/01/2020 11.4     NEUTROPHIL % 04/01/2020 47     LYMPHOCYTE % 04/01/2020  37     MONOCYTE % 04/01/2020 10     EOSINOPHIL % 04/01/2020 5     BASOPHIL % 04/01/2020 1     NEUTROPHIL # 04/01/2020 1.59     LYMPHOCYTE # 04/01/2020 1.28     MONOCYTE # 04/01/2020 0.36     EOSINOPHIL # 04/01/2020 0.16     BASOPHIL # 04/01/2020 <0.10     IMMATURE GRANULOCYTE % 04/01/2020 0     IMMATURE GRANULOCYTE # 04/01/2020 <0.10     GLUCOSE, POC 04/01/2020 182*    TROPONIN I 04/01/2020 41*    TROPONIN I 04/01/2020 52*    GLUCOSE, POC 04/01/2020 211*    TROPONIN I 04/01/2020 47*        Imaging Studies:    XR AP MOBILE CHEST   Final Result by Edi, Radresults In (04/21 1033)   Suspicious for pneumonia developing in the left base.      This note was partially created using voice recognition software and is   inherently subject to errors including those of syntax and "sound alike "   substitutions which may escape proof reading.  In such instances, original   meaning may be extrapolated by contextual derivation.         Radiologist location ID: [id number]         CT ANGIO CHEST FOR  PULMONARY EMBOLUS W IV CONTRAST   Final Result by Edi, Radresults In (04/20 1551)   Bilateral pleural effusions more marked on the left.      Irregular airspace disease right base representing pneumonia and/or   atelectasis. Follow-up until clear advised to exclude malignancy.      Negative for pulmonary embolism. Negative for aortic dissection.      Chronic anasarca chest and upper abdomen.      Ascites within the abdomen. Rule out cirrhosis.      Negative for pneumothorax.      There is a small hiatal hernia.         The CT exam was performed using one or more the following a dose reduction   techniques: Automated exposure control, adjustment of the mA and/or kV   according to the patient's size, or use of iterative reconstruction   technique.      This note was partially created using voice recognition software and is   inherently subject to errors including those of syntax and "sound alike "   substitutions which may escape proof reading.  In such instances, original   meaning may be extrapolated by contextual derivation.         Radiologist location ID: [id number]                    DISCHARGE INSTRUCTIONS:  Follow-up Information       Family Medicine, [facility] .    Specialty: Family Medicine  Contact information:  [place] SSN-[id number]  [phone]                    DISCHARGE INSTRUCTION - DIET     Diet: RESUME HOME DIET      DISCHARGE INSTRUCTION - ACTIVITY     Activity: AS TOLERATED      DISCHARGE INSTRUCTION - MISC    Take medications as prescribed  Prescription for antibiotics at [facility] in [place]  Return for increasing chest pain, shortness of breath, fever chills     FOLLOW-UP: [place],  [place] scheduled appointment     Follow-up in: [place]    Reason for visit: [place] form not discussed    CONDITION ON DISCHARGE: Alert, Oriented and  VS Stable    DISCHARGE DISPOSITION:  Home discharge  and Home antibiotics     DISPOSITION TIME:  45 minutes      Marelin Crews, PA-C    I saw and evaluated the patient with the advanced practice provider.  I reviewed the practitioner's note.  I agree with the findings and plan of care as documented in the note.  My findings are patient admitted to the hospital for chest pain rule out MI.  the patient has ruled out.  Troponins are negative.  Patient can be discharged home.      Bethanie Dicker, MD  04/01/2020, 17:30

## 2020-04-01 NOTE — Nurses Notes (Signed)
AVS reviewed with patient/care giver.  A written copy of the AVS and discharge instructions was given to the patient/care giver.  Questions sufficiently answered as needed.  Patient/care giver encouraged to follow up with PCP as indicated.  In the event of an emergency, patient/care giver instructed to call 911 or go to the nearest emergency room. Flushed mediport to left upper chest with heparin per policy. Jamse Mead, RN  04/01/2020, 17:08

## 2020-04-02 ENCOUNTER — Telehealth (HOSPITAL_COMMUNITY): Payer: Self-pay

## 2020-04-02 NOTE — Nursing Note (Signed)
(  dot)Transition of Care Contact  Discharge Date: 04/01/2020  Transition Facility Type--Hospital (Inpatient or Observation)  [facility]  Interactive Contact(s): Completed or attempted contact indicated by Date/Time  Completed Contact--04/02/2020 11:12 AM  First Attempt  Second Attempt  Third Attempt  Contact Method(s)-- Patient/Caregiver Telephone  Clinical Staff Name/Role who [name] Gore, LPN  Transition Assessment  Discharge Summary obtained?--Yes  How are you recovering?--Improving  Discharge Meds obtained?--Yes  Medications reconcilled with Discharge Documentation?--Yes  Medication understanding --has assistance managing medications  Medication Concerns?--No  Have everything needed for recovery?--Yes  Care Coordination:   Patient has transition follow-up appointment date and time?--Yes  Primary Care Transition Visit planned?--Yes  Specialist Transition Visit planned?  Patient/caregiver plans to attend transition visit?  Primary Follow-up Barrier  Interventions provided --reinforced discharge instructions--follow-up appointment date/time reinforced--med list reviewed with caregiver  Home Health or DME ordered at discharge?--Yes  Agency has contacted patient to initiate services?--Yes  Home Health or DME Agency:--[facility]  Clinician/Team notified?  Primary reason clinician notified?  Transition Note:   Spoke with son who states patient seems to be doing well today.  Denies fever or increased shortness of breath.  No new complaints or concerns.  Home meds taken as directed.  Has virtual follow up with Dr [name] tomorrow at 1230.     Further information may be documented in relevant telephone or outreach encounter.

## 2020-04-03 ENCOUNTER — Telehealth (INDEPENDENT_AMBULATORY_CARE_PROVIDER_SITE_OTHER): Payer: Self-pay | Admitting: FAMILY PRACTICE

## 2020-04-03 ENCOUNTER — Encounter (INDEPENDENT_AMBULATORY_CARE_PROVIDER_SITE_OTHER): Payer: Self-pay | Admitting: FAMILY PRACTICE

## 2020-04-03 NOTE — Telephone Encounter (Signed)
[facility] social worker, Lattie Haw called this AM    She reports seeing patient yesterday    Patient discussed her leg pain with Lattie Haw and said it was a 8 on 10 scale and hurts bad.  She refused taking anything to help relieve this pain    She reports patient seems upset and sad    If anything else Dr Karmen Bongo would like for [facility] to help in, please fax them orders.    Lucretia Field, RN

## 2020-04-03 NOTE — Telephone Encounter (Signed)
Noted.  Eyonna Sandstrom L. Kamerin Axford, RN

## 2020-04-03 NOTE — Telephone Encounter (Signed)
According to the the chart she should have Hydrocodone to take for her pain.

## 2020-04-08 NOTE — Nursing Note (Signed)
(  dot)Transition of Care Contact  Discharge Date: 04/01/2020  Transition Facility Type--Hospital (Inpatient or Observation)  [facility]  Interactive Contact(s): Completed or attempted contact indicated by Date/Time  Completed Contact--04/02/2020 11:12 AM  First Attempt  Second Attempt  Third Attempt  Contact Method(s)-- Patient/Caregiver Telephone  Clinical Staff Name/Role who [name], LPN  Transition Assessment  Discharge Summary obtained?--Yes  How are you recovering?--Improving  Discharge Meds obtained?--Yes  Medications reconcilled with Discharge Documentation?--Yes  Medication understanding --has assistance managing medications  Medication Concerns?--No  Have everything needed for recovery?--Yes  Care Coordination:   Patient has transition follow-up appointment date and time?--Yes  Primary Care Transition Visit planned?--Yes  Specialist Transition Visit planned?  Patient/caregiver plans to attend transition visit?  Primary Follow-up Barrier  Interventions provided --reinforced discharge instructions--follow-up appointment date/time reinforced--med list reviewed with caregiver  Home Health or DME ordered at discharge?--Yes  Agency has contacted patient to initiate services?--Yes  Home Health or DME Agency:--[facility]  Clinician/Team notified?  Primary reason clinician notified?  Transition Note:   Spoke with daughter in law who states patient is doing better today. Blood sugar and temp WNL. No new complaints or concerns.  Follows up with Dr [name] on 04/03/2020 at 1230.    04/08/2020 1520--Unable to reach patient for follow up.  No answer.     Further information may be documented in relevant telephone or outreach encounter.

## 2020-04-09 ENCOUNTER — Telehealth (INDEPENDENT_AMBULATORY_CARE_PROVIDER_SITE_OTHER): Payer: Self-pay | Admitting: FAMILY PRACTICE

## 2020-04-09 NOTE — Telephone Encounter (Signed)
Pt. Notified and verbalized understanding. Pt. States that the Hydrocodone is not very effective. Michelene Heady, RN

## 2020-04-09 NOTE — Telephone Encounter (Signed)
Patient should have an Rx for pain as hydrocodone was sent in for her on 03/19/20 120 tabs.  I would have them give one extra lasix today and tomorrow and then go back to regular dose. drmark

## 2020-04-09 NOTE — Telephone Encounter (Signed)
[name] with [facility] called to report increased pain to lower extremities and is asking for something for pain. Pt. Has had a weight gain of 6 lbs in the last week. Pt. Is currently taking Lasix 80mg  daily. Thanks. Michelene Heady, RN

## 2020-04-09 NOTE — Telephone Encounter (Signed)
I cannot increase since I have not seen her for some time

## 2020-04-10 ENCOUNTER — Telehealth (INDEPENDENT_AMBULATORY_CARE_PROVIDER_SITE_OTHER): Payer: Self-pay | Admitting: FAMILY PRACTICE

## 2020-04-10 ENCOUNTER — Other Ambulatory Visit (INDEPENDENT_AMBULATORY_CARE_PROVIDER_SITE_OTHER): Payer: Self-pay | Admitting: FAMILY PRACTICE

## 2020-04-10 NOTE — Telephone Encounter (Signed)
I would suggest that you have them do a CMP and CBC.

## 2020-04-10 NOTE — Telephone Encounter (Signed)
Pt's family notified and verbalized understanding. Patient's daughter was asking if she could have [facility] draw some labs to have something to discuss. The daughter wants to know if they can do a televisit. Appt. Thanks. Michelene Heady, RN

## 2020-04-10 NOTE — Telephone Encounter (Signed)
Order faxed to [facility]. Michelene Heady, RN

## 2020-04-10 NOTE — Telephone Encounter (Signed)
New orders per Dr. Karmen Bongo for [facility] to obtain CMP and CBC next week. Left message on patients answering machine to call back and to schedule a tele visit. Orders faxed to [facility]. Michelene Heady, RN

## 2020-04-10 NOTE — Telephone Encounter (Signed)
I cannot do a televisit today. They can draw labs for CMP and CBC.  drmark

## 2020-04-10 NOTE — Telephone Encounter (Signed)
Can you please resend back to me what labs you want obtained because for some reason when I go to print the order to be faxed it is not there. Thanks. Michelene Heady, RN

## 2020-04-10 NOTE — Telephone Encounter (Signed)
Order faxed to [facility] .Michelene Heady, RN

## 2020-04-14 ENCOUNTER — Telehealth (INDEPENDENT_AMBULATORY_CARE_PROVIDER_SITE_OTHER): Payer: Self-pay | Admitting: FAMILY PRACTICE

## 2020-04-14 ENCOUNTER — Other Ambulatory Visit: Payer: Self-pay

## 2020-04-14 ENCOUNTER — Ambulatory Visit: Payer: Medicare Other | Attending: FAMILY PRACTICE

## 2020-04-14 DIAGNOSIS — Z794 Long term (current) use of insulin: Secondary | ICD-10-CM | POA: Insufficient documentation

## 2020-04-14 DIAGNOSIS — E1142 Type 2 diabetes mellitus with diabetic polyneuropathy: Secondary | ICD-10-CM | POA: Insufficient documentation

## 2020-04-14 LAB — COMPREHENSIVE METABOLIC PNL, FASTING
ALBUMIN: 2.6 g/dL — ABNORMAL LOW (ref 3.4–4.8)
ALKALINE PHOSPHATASE: 84 U/L (ref 55–145)
ALT (SGPT): 9 U/L (ref 8–22)
ANION GAP: 8 mmol/L (ref 4–13)
AST (SGOT): 17 U/L (ref 8–45)
BILIRUBIN TOTAL: 0.3 mg/dL (ref 0.3–1.3)
BUN/CREA RATIO: 13 (ref 6–22)
BUN: 19 mg/dL (ref 8–25)
CALCIUM: 7.9 mg/dL — ABNORMAL LOW (ref 8.8–10.2)
CHLORIDE: 100 mmol/L (ref 96–111)
CO2 TOTAL: 21 mmol/L — ABNORMAL LOW (ref 23–31)
CREATININE: 1.47 mg/dL — ABNORMAL HIGH (ref 0.60–1.05)
ESTIMATED GFR: 35 mL/min/BSA — ABNORMAL LOW (ref >=60)
GLUCOSE: 207 mg/dL — ABNORMAL HIGH (ref 65–125)
POTASSIUM: 4.6 mmol/L (ref 3.5–5.1)
PROTEIN TOTAL: 4.4 g/dL — ABNORMAL LOW (ref 6.0–8.0)
SODIUM: 129 mmol/L — ABNORMAL LOW (ref 136–145)

## 2020-04-14 LAB — CBC WITH DIFF
BASOPHIL #: <0.1 10*3/uL (ref <=0.20)
BASOPHIL %: 2 %
EOSINOPHIL #: <0.1 10*3/uL (ref <=0.50)
EOSINOPHIL %: 2 %
HCT: 29 % — ABNORMAL LOW (ref 34.8–46.0)
HGB: 9.2 g/dL — ABNORMAL LOW (ref 11.5–16.0)
IMMATURE GRANULOCYTE #: <0.1 10*3/uL (ref <0.10)
IMMATURE GRANULOCYTE %: 0 % (ref 0–1)
LYMPHOCYTE #: 1.14 10*3/uL (ref 1.00–4.80)
LYMPHOCYTE %: 31 %
MCH: 29.6 pg (ref 26.0–32.0)
MCHC: 31.7 g/dL (ref 31.0–35.5)
MCV: 93.2 fL (ref 78.0–100.0)
MONOCYTE #: 0.44 10*3/uL (ref 0.20–1.10)
MONOCYTE %: 12 %
MPV: 12.7 fL — ABNORMAL HIGH (ref 8.7–12.5)
NEUTROPHIL #: 1.91 10*3/uL (ref 1.50–7.70)
NEUTROPHIL %: 53 %
PLATELETS: 108 10*3/uL — ABNORMAL LOW (ref 150–400)
RBC: 3.11 10*6/uL — ABNORMAL LOW (ref 3.85–5.22)
RDW-CV: 14.6 % (ref 11.5–15.5)
WBC: 3.6 10*3/uL — ABNORMAL LOW (ref 3.7–11.0)

## 2020-04-14 NOTE — Telephone Encounter (Signed)
Called and left message with pts Sister, [name].  Marcie Bal, LPN

## 2020-04-14 NOTE — Progress Notes (Signed)
Please call the patient tell from the test results show a CBC although abnormal is stable

## 2020-04-14 NOTE — Telephone Encounter (Signed)
-----   Message from Myrene Buddy, MD sent at 04/14/2020  3:38 PM EDT -----  Please call the patient tell from the test results were abnormal. They are about the same as they have been with no marked change.

## 2020-04-14 NOTE — Progress Notes (Signed)
Please call the patient tell from the test results were abnormal. They are about the same as they have been with no marked change.

## 2020-04-14 NOTE — Telephone Encounter (Signed)
[name] from [place] called to report that they are having a hard time obtaining blood from patient and is needing clarification to uses patient's port. Dr. Karmen Bongo notified and new orders received for [facility] to use patients medi port. [name] from [facility] notified and verbalized understanding. [name] to call back and let me know what supplies she needs. Michelene Heady, RN

## 2020-04-14 NOTE — Telephone Encounter (Signed)
Called and left message with pts sister, [name]. Marcie Bal, LPN

## 2020-04-15 NOTE — Nursing Note (Signed)
(  dot)Transition of Care Contact  Discharge Date: 04/01/2020  Transition Facility Type--Hospital (Inpatient or Observation)  [facility]  Interactive Contact(s): Completed or attempted contact indicated by Date/Time  Completed Contact--04/02/2020 11:12 AM  First Attempt  Second Attempt  Third Attempt  Contact Method(s)-- Patient/Caregiver Telephone  Clinical Staff Name/Role who [name], LPN  Transition Assessment  Discharge Summary obtained?--Yes  How are you recovering?--Improving  Discharge Meds obtained?--Yes  Medications reconcilled with Discharge Documentation?--Yes  Medication understanding --has assistance managing medications  Medication Concerns?--No  Have everything needed for recovery?--Yes  Care Coordination:   Patient has transition follow-up appointment date and time?--Yes  Primary Care Transition Visit planned?--Yes  Specialist Transition Visit planned?  Patient/caregiver plans to attend transition visit?  Primary Follow-up Barrier  Interventions provided --reinforced discharge instructions--follow-up appointment date/time reinforced--med list reviewed with caregiver  Home Health or DME ordered at discharge?--Yes  Agency has contacted patient to initiate services?--Yes  Home Health or DME Agency:--[facility]  Clinician/Team notified?  Primary reason clinician notified?  Transition Note:   Spoke with daughter in law who states patient is doing better today. Blood sugar and temp WNL. No new complaints or concerns.  Follows up with Dr [name] on 04/03/2020 at 1230.    04/08/2020 1520--Unable to reach patient for follow up.  No answer.    04/15/2020 1534--Caregiver states patient is doing fair with the exception of nausea.  No vomiting reported.  Has phone visit scheduled with PCP tomorrow.  Continues with home health thru [facility].       Further information may be documented in relevant telephone or outreach encounter.

## 2020-04-16 ENCOUNTER — Other Ambulatory Visit: Payer: Self-pay

## 2020-04-16 ENCOUNTER — Telehealth (INDEPENDENT_AMBULATORY_CARE_PROVIDER_SITE_OTHER): Payer: Self-pay | Admitting: FAMILY PRACTICE

## 2020-04-16 ENCOUNTER — Encounter (INDEPENDENT_AMBULATORY_CARE_PROVIDER_SITE_OTHER): Payer: Self-pay | Admitting: FAMILY PRACTICE

## 2020-04-16 ENCOUNTER — Ambulatory Visit: Payer: Medicare Other | Attending: FAMILY PRACTICE | Admitting: FAMILY PRACTICE

## 2020-04-16 ENCOUNTER — Other Ambulatory Visit (INDEPENDENT_AMBULATORY_CARE_PROVIDER_SITE_OTHER): Payer: Self-pay | Admitting: FAMILY PRACTICE

## 2020-04-16 DIAGNOSIS — E1142 Type 2 diabetes mellitus with diabetic polyneuropathy: Secondary | ICD-10-CM | POA: Insufficient documentation

## 2020-04-16 DIAGNOSIS — Z794 Long term (current) use of insulin: Secondary | ICD-10-CM | POA: Insufficient documentation

## 2020-04-16 DIAGNOSIS — R601 Generalized edema: Secondary | ICD-10-CM | POA: Insufficient documentation

## 2020-04-16 DIAGNOSIS — E222 Syndrome of inappropriate secretion of antidiuretic hormone: Secondary | ICD-10-CM | POA: Insufficient documentation

## 2020-04-16 DIAGNOSIS — C259 Malignant neoplasm of pancreas, unspecified: Secondary | ICD-10-CM | POA: Insufficient documentation

## 2020-04-16 MED ORDER — OMEPRAZOLE 40 MG CAPSULE,DELAYED RELEASE
40.00 mg | DELAYED_RELEASE_CAPSULE | Freq: Every day | ORAL | 4 refills | Status: DC
Start: 2020-04-16 — End: 2020-10-21

## 2020-04-16 MED ORDER — FUROSEMIDE 80 MG TABLET
120.00 mg | ORAL_TABLET | Freq: Every day | ORAL | 0 refills | Status: DC
Start: 2020-04-16 — End: 2020-07-03

## 2020-04-16 MED ORDER — ONDANSETRON HCL 4 MG TABLET
4.00 mg | ORAL_TABLET | Freq: Four times a day (QID) | ORAL | 3 refills | Status: AC | PRN
Start: 2020-04-16 — End: 2020-05-16

## 2020-04-16 MED ORDER — OXYCODONE-ACETAMINOPHEN 5 MG-325 MG TABLET
1.00 | ORAL_TABLET | Freq: Four times a day (QID) | ORAL | 0 refills | Status: DC | PRN
Start: 2020-04-16 — End: 2020-05-15

## 2020-04-16 NOTE — Assessment & Plan Note (Signed)
ON NA tabs and lasix

## 2020-04-16 NOTE — Assessment & Plan Note (Signed)
Increase of pain control by changing to percocet

## 2020-04-16 NOTE — Assessment & Plan Note (Signed)
Appears stable for now

## 2020-04-16 NOTE — Patient Instructions (Signed)
Recheck 4 weeks

## 2020-04-16 NOTE — Progress Notes (Signed)
FAMILY MEDICINE, [facility]  [place] [id number]    Telephone Visit    Name:  [name] MRN: [id number]   Date:  04/16/2020 Age:   74 y.o.     The patient/family initiated a request for telephone service.  Verbal consent for this service was obtained from the patient/family.    Last office visit in this department: 02/11/2020      Reason for call: pain not controlled.  Having more nausea and increase weight gain    Call notes:  Daughter-in-law is her care taker.  She states that she is not having adequate pain control with the hydrocodone 10/325 1 tab q6hr prn.  I have suggested hospice since she is a terminal cancer patient, but for now they want to continue with the [facility] that they have.  Her sodium is low.  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  She states that the lasix 80mg  per day is not holding her fluid off.  She states that her BS are doing OK.    She has been having a lot of nausea.  I will send in some Zofran.     Review of Systems   Constitutional: Positive for weight loss. Negative for chills and fever.   HENT: Negative for congestion, ear pain and sinus pain.    Eyes: Negative for blurred vision and double vision.   Respiratory: Negative for cough, sputum production, shortness of breath and wheezing.    Cardiovascular: Positive for leg swelling. Negative for chest pain and palpitations.   Gastrointestinal: Positive for abdominal pain and nausea. Negative for heartburn and vomiting.   Genitourinary: Negative for dysuria.   Musculoskeletal: Positive for back pain and myalgias.   Skin: Negative for rash.   Neurological: Negative for dizziness, weakness and headaches.   Endo/Heme/Allergies: Does not bruise/bleed easily.   Psychiatric/Behavioral: Negative for depression.      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    This note was partially created using voice recognition software and is inherently subject to errors including those of syntax and "sound alike " substitutions which may escape proof reading.  In such instances, original meaning may be extrapolated by contextual derivation.    Myrene Buddy, MD

## 2020-04-16 NOTE — Nursing Note (Signed)
Telephone triage complete. Spoke with pt grandaughter for pt information.  She reports that she has had a chronic headache for some time along with intermittent nausea and diarrhea. Pt has not received the covid vaccine. No refills needed at this time. BE

## 2020-04-16 NOTE — Assessment & Plan Note (Signed)
Difficult to control on the Lasix 80mg  per day

## 2020-04-17 ENCOUNTER — Encounter (INDEPENDENT_AMBULATORY_CARE_PROVIDER_SITE_OTHER): Payer: Self-pay | Admitting: FAMILY PRACTICE

## 2020-04-17 ENCOUNTER — Telehealth (INDEPENDENT_AMBULATORY_CARE_PROVIDER_SITE_OTHER): Payer: Self-pay | Admitting: FAMILY PRACTICE

## 2020-04-17 NOTE — Telephone Encounter (Signed)
Called insurance at [phone] to get prior authorization for ondansetron 4mg .  Call ref# [id number], faxing over a form for the dr to fill out and fax back.  Marcie Bal, LPN

## 2020-04-20 ENCOUNTER — Telehealth (INDEPENDENT_AMBULATORY_CARE_PROVIDER_SITE_OTHER): Payer: Self-pay | Admitting: FAMILY PRACTICE

## 2020-04-20 NOTE — Telephone Encounter (Signed)
I called and spoke with [name] and notified her of the response from Dr. Karmen Bongo. She verbalized understanding and they are considering Hospice at some point but not ready yet. Melburn Popper, RN

## 2020-04-20 NOTE — Telephone Encounter (Signed)
Percocet is a more potent pain medication than hydrocodone.  I could not significantly increase her pain relief without having her take too much acetaminophen, but percocet since is is more potent I will not have this issue unless we get to a max dose of this.  If we do then she will have to go to something else like morphine.    We will cross that bridge when we come to it.  I really think that they should consider Hospice for her.

## 2020-04-20 NOTE — Telephone Encounter (Signed)
-----   Message from Melburn Popper, RN sent at 04/20/2020  8:49 AM EDT -----  Regarding: FW: Prescription Question  Contact: [phone]    ----- Message -----  From: [name]  Sent: 04/17/2020   8:42 AM EDT  To: , #  Subject: Prescription Question                            Good morning Dr [name],    I sent my husband after grandmas medicine yesterday and by the time he had gotten home and I had went through the new prescriptions your office was closed. Now I may be confused, and please correct me if I am but I thought you wanted for her to get away from the acetaminophen and the medication he picked up still had 325 mg per tablet. If this is what you intended then there is no problem but I was thinking you didn't want her taking acetaminophen any longer.    I just felt I should touch base with you and verify this is the correct medication.    Thank you for your time and attention.    Geronimo Running   [phone]

## 2020-04-30 ENCOUNTER — Telehealth (INDEPENDENT_AMBULATORY_CARE_PROVIDER_SITE_OTHER): Payer: Self-pay | Admitting: FAMILY PRACTICE

## 2020-04-30 MED ORDER — NITROFURANTOIN MONOHYDRATE/MACROCRYSTALS 100 MG CAPSULE
100.00 mg | ORAL_CAPSULE | Freq: Every evening | ORAL | 3 refills | Status: DC
Start: 2020-04-30 — End: 2020-08-13

## 2020-04-30 NOTE — Telephone Encounter (Signed)
I would suggest that she stop the trimethoprim if we cannot get it.  I will send in Rx for macrodantin 36mmg PO daily, #30, refills x 5 to [facility] in [place].  I would suggest that she get some gel cushioning and try to keep pressure off the area as much as possible.  She can get lidocaine 4% OTC if she desires.  Ask home health nurse to check the area at the next visit.

## 2020-04-30 NOTE — Telephone Encounter (Signed)
I called patients caretaker, Stanton Kidney and explained Dr Karmen Bongo orders    She understood  I will fax this note to [facility] for their review    Lucretia Field, RN

## 2020-04-30 NOTE — Telephone Encounter (Signed)
Patients care taker called    She states that she has a pressure area on her buttocks and it hurts.  She is asking for some Lidocaine cream for this area if possible    Also she is having a hard time getting the prophylaxis Trimethoprim Rx.  Pharmacist told her they are having a hard time getting this medicine at this time, and may need to change it.  Stanton Kidney said this med is helping control repeat UTI, but unable to get it right now.    Message to Dr Karmen Bongo for orders    Wg/s  Allergy Sulfa    Lucretia Field, RN

## 2020-05-08 ENCOUNTER — Telehealth (INDEPENDENT_AMBULATORY_CARE_PROVIDER_SITE_OTHER): Payer: Self-pay | Admitting: FAMILY PRACTICE

## 2020-05-08 NOTE — Telephone Encounter (Signed)
Lattie Haw Education officer, museum from [facility] called to say say that she has to report that patient is having abd. Pain and a pain scale of 8. Pt. is c/o HA, diarrhea. Pt. Is refusing to go to the ER. Pt. Is only taking Oxycodone half tablet every 3 hours. Michelene Heady, RN

## 2020-05-12 ENCOUNTER — Telehealth (INDEPENDENT_AMBULATORY_CARE_PROVIDER_SITE_OTHER): Payer: Self-pay | Admitting: FAMILY PRACTICE

## 2020-05-12 ENCOUNTER — Emergency Department (HOSPITAL_COMMUNITY): Payer: Medicare Other

## 2020-05-12 ENCOUNTER — Observation Stay
Admission: EM | Admit: 2020-05-12 | Discharge: 2020-05-13 | Disposition: A | Discharge status: Home / Self Care | Payer: Medicare Other | Attending: Family Medicine | Admitting: Family Medicine

## 2020-05-12 ENCOUNTER — Encounter (HOSPITAL_COMMUNITY): Payer: Self-pay

## 2020-05-12 ENCOUNTER — Other Ambulatory Visit: Payer: Self-pay

## 2020-05-12 ENCOUNTER — Encounter (INDEPENDENT_AMBULATORY_CARE_PROVIDER_SITE_OTHER): Payer: Self-pay | Admitting: FAMILY PRACTICE

## 2020-05-12 DIAGNOSIS — R188 Other ascites: Secondary | ICD-10-CM | POA: Insufficient documentation

## 2020-05-12 DIAGNOSIS — I4891 Unspecified atrial fibrillation: Secondary | ICD-10-CM | POA: Insufficient documentation

## 2020-05-12 DIAGNOSIS — I509 Heart failure, unspecified: Secondary | ICD-10-CM | POA: Insufficient documentation

## 2020-05-12 DIAGNOSIS — Z79899 Other long term (current) drug therapy: Secondary | ICD-10-CM | POA: Insufficient documentation

## 2020-05-12 DIAGNOSIS — G43909 Migraine, unspecified, not intractable, without status migrainosus: Secondary | ICD-10-CM | POA: Insufficient documentation

## 2020-05-12 DIAGNOSIS — K219 Gastro-esophageal reflux disease without esophagitis: Secondary | ICD-10-CM | POA: Insufficient documentation

## 2020-05-12 DIAGNOSIS — R0789 Other chest pain: Principal | ICD-10-CM | POA: Insufficient documentation

## 2020-05-12 DIAGNOSIS — M1990 Unspecified osteoarthritis, unspecified site: Secondary | ICD-10-CM | POA: Insufficient documentation

## 2020-05-12 DIAGNOSIS — E669 Obesity, unspecified: Secondary | ICD-10-CM | POA: Insufficient documentation

## 2020-05-12 DIAGNOSIS — G4730 Sleep apnea, unspecified: Secondary | ICD-10-CM | POA: Insufficient documentation

## 2020-05-12 DIAGNOSIS — E785 Hyperlipidemia, unspecified: Secondary | ICD-10-CM | POA: Diagnosis present

## 2020-05-12 DIAGNOSIS — Z7982 Long term (current) use of aspirin: Secondary | ICD-10-CM | POA: Insufficient documentation

## 2020-05-12 DIAGNOSIS — R079 Chest pain, unspecified: Secondary | ICD-10-CM | POA: Diagnosis present

## 2020-05-12 DIAGNOSIS — Z20822 Contact with and (suspected) exposure to covid-19: Secondary | ICD-10-CM | POA: Insufficient documentation

## 2020-05-12 DIAGNOSIS — Z87891 Personal history of nicotine dependence: Secondary | ICD-10-CM | POA: Insufficient documentation

## 2020-05-12 DIAGNOSIS — E7800 Pure hypercholesterolemia, unspecified: Secondary | ICD-10-CM | POA: Insufficient documentation

## 2020-05-12 DIAGNOSIS — I10 Essential (primary) hypertension: Secondary | ICD-10-CM | POA: Diagnosis present

## 2020-05-12 DIAGNOSIS — I110 Hypertensive heart disease with heart failure: Secondary | ICD-10-CM | POA: Insufficient documentation

## 2020-05-12 DIAGNOSIS — C799 Secondary malignant neoplasm of unspecified site: Secondary | ICD-10-CM | POA: Insufficient documentation

## 2020-05-12 DIAGNOSIS — J449 Chronic obstructive pulmonary disease, unspecified: Secondary | ICD-10-CM | POA: Insufficient documentation

## 2020-05-12 DIAGNOSIS — Z794 Long term (current) use of insulin: Secondary | ICD-10-CM | POA: Insufficient documentation

## 2020-05-12 DIAGNOSIS — K7460 Unspecified cirrhosis of liver: Secondary | ICD-10-CM | POA: Insufficient documentation

## 2020-05-12 DIAGNOSIS — E1142 Type 2 diabetes mellitus with diabetic polyneuropathy: Secondary | ICD-10-CM | POA: Insufficient documentation

## 2020-05-12 DIAGNOSIS — J45909 Unspecified asthma, uncomplicated: Secondary | ICD-10-CM | POA: Diagnosis present

## 2020-05-12 DIAGNOSIS — C259 Malignant neoplasm of pancreas, unspecified: Secondary | ICD-10-CM | POA: Insufficient documentation

## 2020-05-12 DIAGNOSIS — Z86711 Personal history of pulmonary embolism: Secondary | ICD-10-CM | POA: Insufficient documentation

## 2020-05-12 DIAGNOSIS — Z6830 Body mass index (BMI) 30.0-30.9, adult: Secondary | ICD-10-CM | POA: Insufficient documentation

## 2020-05-12 DIAGNOSIS — F419 Anxiety disorder, unspecified: Secondary | ICD-10-CM | POA: Insufficient documentation

## 2020-05-12 HISTORY — DX: Type 2 diabetes mellitus without complications (CMS HCC): E11.9

## 2020-05-12 LAB — COMPREHENSIVE METABOLIC PANEL, NON-FASTING
ALBUMIN: 2.9 g/dL — ABNORMAL LOW (ref 3.4–4.8)
ALKALINE PHOSPHATASE: 111 U/L (ref 55–145)
ALT (SGPT): 7 U/L — ABNORMAL LOW (ref 8–22)
ANION GAP: 11 mmol/L (ref 4–13)
AST (SGOT): 18 U/L (ref 8–45)
BILIRUBIN TOTAL: 0.3 mg/dL (ref 0.3–1.3)
BUN/CREA RATIO: 26 — ABNORMAL HIGH (ref 6–22)
BUN: 22 mg/dL (ref 8–25)
CALCIUM: 8.8 mg/dL (ref 8.8–10.2)
CHLORIDE: 101 mmol/L (ref 96–111)
CO2 TOTAL: 22 mmol/L — ABNORMAL LOW (ref 23–31)
CREATININE: 0.84 mg/dL (ref 0.60–1.05)
ESTIMATED GFR: 70 mL/min/BSA (ref >=60)
GLUCOSE: 86 mg/dL (ref 65–125)
POTASSIUM: 3.9 mmol/L (ref 3.5–5.1)
PROTEIN TOTAL: 5 g/dL — ABNORMAL LOW (ref 6.0–8.0)
SODIUM: 134 mmol/L — ABNORMAL LOW (ref 136–145)

## 2020-05-12 LAB — TROPONIN-I
TROPONIN I: 27 ng/L (ref 7–30)
TROPONIN I: 21 ng/L (ref 7–30)

## 2020-05-12 LAB — ECG 12-LEAD
Atrial Rate: 70 {beats}/min
Calculated P Axis: 55 degrees
Calculated R Axis: -18 degrees
Calculated T Axis: 24 degrees
PR Interval: 214 ms
QRS Duration: 98 ms
QT Interval: 424 ms
QTC Calculation: 457 ms
Ventricular rate: 70 {beats}/min

## 2020-05-12 LAB — CBC WITH DIFF
BASOPHIL #: <0.1 10*3/uL (ref <=0.20)
BASOPHIL %: 1 %
EOSINOPHIL #: <0.1 10*3/uL (ref <=0.50)
EOSINOPHIL %: 2 %
HCT: 31.3 % — ABNORMAL LOW (ref 34.8–46.0)
HGB: 9.9 g/dL — ABNORMAL LOW (ref 11.5–16.0)
IMMATURE GRANULOCYTE #: <0.1 10*3/uL (ref <0.10)
IMMATURE GRANULOCYTE %: 0 % (ref 0–1)
LYMPHOCYTE #: 0.59 10*3/uL — ABNORMAL LOW (ref 1.00–4.80)
LYMPHOCYTE %: 17 %
MCH: 28.5 pg (ref 26.0–32.0)
MCHC: 31.6 g/dL (ref 31.0–35.5)
MCV: 90.2 fL (ref 78.0–100.0)
MONOCYTE #: 0.34 10*3/uL (ref 0.20–1.10)
MONOCYTE %: 10 %
MPV: 11.5 fL (ref 8.7–12.5)
NEUTROPHIL #: 2.43 10*3/uL (ref 1.50–7.70)
NEUTROPHIL %: 70 %
PLATELETS: 107 10*3/uL — ABNORMAL LOW (ref 150–400)
RBC: 3.47 10*6/uL — ABNORMAL LOW (ref 3.85–5.22)
RDW-CV: 13.5 % (ref 11.5–15.5)
WBC: 3.5 10*3/uL — ABNORMAL LOW (ref 3.7–11.0)

## 2020-05-12 LAB — GOLD TOP TUBE

## 2020-05-12 LAB — POC BLOOD GLUCOSE (RESULTS)
GLUCOSE, POC: 126 mg/dl — ABNORMAL HIGH (ref 60–110)
GLUCOSE, POC: 256 mg/dl — ABNORMAL HIGH (ref 60–110)

## 2020-05-12 LAB — LAVENDER TOP TUBE

## 2020-05-12 LAB — LIGHT GREEN TOP TUBE

## 2020-05-12 LAB — BLUE TOP TUBE

## 2020-05-12 LAB — GRAY TOP TUBE

## 2020-05-12 MED ORDER — CLONIDINE HCL 0.1 MG TABLET
0.1000 mg | ORAL_TABLET | Freq: Once | ORAL | Status: AC
Start: 2020-05-12 — End: 2020-05-12
  Administered 2020-05-12: 0.1 mg via ORAL

## 2020-05-12 MED ORDER — FUROSEMIDE 40 MG TABLET
120.00 mg | ORAL_TABLET | Freq: Every day | ORAL | Status: DC
Start: 2020-05-13 — End: 2020-05-13
  Administered 2020-05-13: 120 mg via ORAL
  Filled 2020-05-12: qty 3

## 2020-05-12 MED ORDER — METOPROLOL SUCCINATE ER 50 MG TABLET,EXTENDED RELEASE 24 HR
50.00 mg | ORAL_TABLET | Freq: Every day | ORAL | Status: DC
Start: 2020-05-13 — End: 2020-05-13

## 2020-05-12 MED ORDER — ALUMINUM-MAG HYDROXIDE-SIMETHICONE 200 MG-200 MG-20 MG/5 ML ORAL SUSP
20.00 mL | ORAL | Status: DC | PRN
Start: 2020-05-12 — End: 2020-05-13

## 2020-05-12 MED ORDER — OXYCODONE 5 MG TABLET
5.00 mg | ORAL_TABLET | Freq: Four times a day (QID) | ORAL | Status: DC | PRN
Start: 2020-05-12 — End: 2020-05-13
  Administered 2020-05-13: 5 mg via ORAL
  Filled 2020-05-12: qty 1

## 2020-05-12 MED ORDER — DILTIAZEM 30 MG TABLET
30.00 mg | ORAL_TABLET | Freq: Four times a day (QID) | ORAL | Status: DC
Start: 2020-05-12 — End: 2020-05-13
  Administered 2020-05-12 – 2020-05-13 (×5): 30 mg via ORAL
  Administered 2020-05-13: 0 mg via ORAL
  Filled 2020-05-12 (×5): qty 1

## 2020-05-12 MED ORDER — LIPASE-PROTEASE-AMYLASE 6,000-19,000-30,000 UNIT CAPSULE,DELAYED REL
2.00 | DELAYED_RELEASE_CAPSULE | Freq: Three times a day (TID) | ORAL | Status: DC
Start: 2020-05-12 — End: 2020-05-12

## 2020-05-12 MED ORDER — CLONIDINE HCL 0.1 MG TABLET
0.1000 mg | ORAL_TABLET | Freq: Once | ORAL | Status: AC
Start: 2020-05-12 — End: 2020-05-12
  Administered 2020-05-12: 0.1 mg via ORAL
  Filled 2020-05-12: qty 1

## 2020-05-12 MED ORDER — ATORVASTATIN 10 MG TABLET
10.00 mg | ORAL_TABLET | Freq: Every evening | ORAL | Status: DC
Start: 2020-05-12 — End: 2020-05-13
  Administered 2020-05-12: 10 mg via ORAL
  Filled 2020-05-12: qty 1

## 2020-05-12 MED ORDER — NITROGLYCERIN 0.4 MG SUBLINGUAL TABLET
0.40 mg | SUBLINGUAL_TABLET | SUBLINGUAL | 5 refills | Status: AC | PRN
Start: 2020-05-12 — End: ?

## 2020-05-12 MED ORDER — CLONIDINE HCL 0.1 MG TABLET
ORAL_TABLET | ORAL | Status: AC
Start: 2020-05-12 — End: 2020-05-13
  Filled 2020-05-12: qty 1

## 2020-05-12 MED ORDER — ACETAMINOPHEN 325 MG TABLET
650.00 mg | ORAL_TABLET | ORAL | Status: DC | PRN
Start: 2020-05-12 — End: 2020-05-13

## 2020-05-12 MED ORDER — ONDANSETRON HCL (PF) 4 MG/2 ML INJECTION SOLUTION
4.00 mg | Freq: Four times a day (QID) | INTRAMUSCULAR | Status: DC | PRN
Start: 2020-05-12 — End: 2020-05-13

## 2020-05-12 MED ORDER — SODIUM CHLORIDE 0.9 % INTRAVENOUS SOLUTION
INTRAVENOUS | Status: DC
Start: 2020-05-12 — End: 2020-05-13

## 2020-05-12 MED ORDER — LORAZEPAM 0.5 MG TABLET
0.50 mg | ORAL_TABLET | Freq: Every evening | ORAL | Status: DC
Start: 2020-05-12 — End: 2020-05-13
  Administered 2020-05-12: 0.5 mg via ORAL
  Filled 2020-05-12: qty 1

## 2020-05-12 MED ORDER — HYDRALAZINE 25 MG TABLET
25.00 mg | ORAL_TABLET | Freq: Two times a day (BID) | ORAL | Status: DC
Start: 2020-05-12 — End: 2020-05-13
  Administered 2020-05-12 – 2020-05-13 (×2): 25 mg via ORAL
  Filled 2020-05-12 (×2): qty 1

## 2020-05-12 MED ORDER — NITROFURANTOIN MONOHYDRATE/MACROCRYSTALS 100 MG CAPSULE
100.00 mg | ORAL_CAPSULE | Freq: Every evening | ORAL | Status: DC
Start: 2020-05-12 — End: 2020-05-13
  Administered 2020-05-12: 100 mg via ORAL
  Filled 2020-05-12: qty 1

## 2020-05-12 MED ORDER — PANTOPRAZOLE 40 MG TABLET,DELAYED RELEASE
40.00 mg | DELAYED_RELEASE_TABLET | Freq: Every day | ORAL | Status: DC
Start: 2020-05-13 — End: 2020-05-13
  Administered 2020-05-13: 0 mg via ORAL
  Administered 2020-05-13: 40 mg via ORAL
  Filled 2020-05-12: qty 1

## 2020-05-12 MED ORDER — LIPASE-PROTEASE-AMYLASE 6,000-19,000-30,000 UNIT CAPSULE,DELAYED REL
4.00 | DELAYED_RELEASE_CAPSULE | Freq: Three times a day (TID) | ORAL | Status: DC
Start: 2020-05-12 — End: 2020-05-13
  Administered 2020-05-12: 4 via ORAL
  Administered 2020-05-13: 0 via ORAL
  Administered 2020-05-13 (×2): 4 via ORAL
  Filled 2020-05-12 (×3): qty 4

## 2020-05-12 MED ORDER — SODIUM CHLORIDE 1 GRAM TABLET
1.00 g | ORAL_TABLET | Freq: Three times a day (TID) | ORAL | Status: DC
Start: 2020-05-12 — End: 2020-05-13
  Administered 2020-05-12 – 2020-05-13 (×3): 1 g via ORAL
  Administered 2020-05-13: 0 g via ORAL
  Filled 2020-05-12 (×3): qty 1

## 2020-05-12 MED ORDER — LORATADINE 10 MG TABLET
10.00 mg | ORAL_TABLET | Freq: Every day | ORAL | Status: DC
Start: 2020-05-13 — End: 2020-05-13
  Administered 2020-05-13: 10 mg via ORAL
  Filled 2020-05-12: qty 1

## 2020-05-12 MED ORDER — HYDRALAZINE 20 MG/ML INJECTION SOLUTION
10.00 mg | INTRAMUSCULAR | Status: AC
Start: 2020-05-12 — End: 2020-05-12
  Administered 2020-05-12: 10 mg via INTRAVENOUS
  Filled 2020-05-12: qty 1

## 2020-05-12 MED ORDER — LIPASE-PROTEASE-AMYLASE 12,000-38,000-60,000 UNIT CAPSULE,DELAYED REL
2.00 | DELAYED_RELEASE_CAPSULE | Freq: Three times a day (TID) | ORAL | Status: DC
Start: 2020-05-12 — End: 2020-05-12

## 2020-05-12 MED ORDER — ASPIRIN 81 MG TABLET,DELAYED RELEASE
81.00 mg | DELAYED_RELEASE_TABLET | Freq: Every day | ORAL | Status: DC
Start: 2020-05-13 — End: 2020-05-13
  Administered 2020-05-13: 81 mg via ORAL
  Filled 2020-05-12: qty 1

## 2020-05-12 MED ORDER — SODIUM CHLORIDE 0.9 % (FLUSH) INJECTION SYRINGE
10.00 mL | INJECTION | INTRAMUSCULAR | Status: DC | PRN
Start: 2020-05-12 — End: 2020-05-13

## 2020-05-12 MED ORDER — INSULIN GLARGINE 100 UNITS/ML SUBQ - CHARGE BY DOSE
10.00 [IU] | Freq: Every evening | SUBCUTANEOUS | Status: DC
Start: 2020-05-12 — End: 2020-05-13
  Administered 2020-05-12: 10 [IU] via SUBCUTANEOUS
  Filled 2020-05-12: qty 10

## 2020-05-12 MED ORDER — LOSARTAN 50 MG TABLET
100.00 mg | ORAL_TABLET | Freq: Every day | ORAL | Status: DC
Start: 2020-05-13 — End: 2020-05-13
  Administered 2020-05-13: 100 mg via ORAL
  Filled 2020-05-12: qty 2

## 2020-05-12 MED ORDER — MAGNESIUM HYDROXIDE 400 MG/5 ML ORAL SUSPENSION
15.00 mL | Freq: Every day | ORAL | Status: DC | PRN
Start: 2020-05-12 — End: 2020-05-13

## 2020-05-12 MED ORDER — NITROGLYCERIN 2 % TRANSDERMAL OINTMENT - PACKET
0.50 [in_us] | TOPICAL_OINTMENT | TRANSDERMAL | Status: AC
Start: 2020-05-12 — End: 2020-05-12
  Administered 2020-05-12: 0.5 [in_us] via TOPICAL
  Filled 2020-05-12: qty 1

## 2020-05-12 MED ORDER — SODIUM CHLORIDE 0.9 % (FLUSH) INJECTION SYRINGE
10.00 mL | INJECTION | Freq: Three times a day (TID) | INTRAMUSCULAR | Status: DC
Start: 2020-05-12 — End: 2020-05-13
  Administered 2020-05-12: 0 mL
  Administered 2020-05-12: 10 mL
  Administered 2020-05-13 (×2): 0 mL

## 2020-05-12 MED ORDER — TIOTROPIUM BROMIDE 2.5 MCG/ACTUATION MIST FOR INHALATION
2.00 | Freq: Every day | RESPIRATORY_TRACT | Status: DC
Start: 2020-05-12 — End: 2020-05-13
  Administered 2020-05-12 – 2020-05-13 (×2): 2 via RESPIRATORY_TRACT
  Filled 2020-05-12: qty 4

## 2020-05-12 MED ORDER — ENOXAPARIN 30 MG/0.3 ML SUBCUTANEOUS SYRINGE
30.00 mg | INJECTION | SUBCUTANEOUS | Status: DC
Start: 2020-05-12 — End: 2020-05-13
  Administered 2020-05-12: 30 mg via SUBCUTANEOUS
  Administered 2020-05-13: 0 mg via SUBCUTANEOUS
  Filled 2020-05-12: qty 0.3

## 2020-05-12 NOTE — ED Nurses Note (Signed)
Report given to [facility] LPN on med surg.  Pt will transfer to 1130A

## 2020-05-12 NOTE — ED Triage Notes (Signed)
Chest pain - onset 945 am -pain woke her up - pt took 2 NTG at home - 324 asa per EMS- and NTG x 1

## 2020-05-12 NOTE — H&P (Signed)
[facility]  Hospitalist  History & Physical    Date of Service:  05/12/2020  [name], [name], 74 y.o. female  Date of Admission:  05/12/2020  Date of Birth:  [birth year]  PCP: Myrene Buddy, MD    Chief Complaint:  Chest pain    HPI:    [name] is a 74 y.o. White female who is admitted for chest pain rule out MI.  the patient presented to the emergency room via EMS with complaints of left-sided chest pain that started when she woke up this morning at approximately 9:45 a.m..  The patient describes the pain as a pressure like pain.  The patient denies any radiation.  The patient believes the pain to be cardiac related.  The patient denies previous history of MI or heart disease.  The patient does report some shortness of breath but denies any aggravating or alleviating factors.  Denies palpitations, diaphoresis, and nausea.  The patient was given nitroglycerin x2 by EMS and 325 of aspirin in route.  In the emergency department the patient's workup was benign.  She had a troponin that was in the normal range at 27. Chest x-ray was negative for active disease.  EKG showed sinus rhythm with first-degree AV block.  Patient will be admitted to the hospital for monitoring on telemetry and serial cardiac enzymes.    Past Medical History:   Diagnosis Date   . Abdominal hernia 10/18/2017    hx of repair   . Anxiety    . Arthritis    . Asthma    . Atrial fibrillation (CMS HCC)    . Back problem    . Bruises easily    . Cancer (CMS [place]) 10/18/2017    chemo and radiation completed 09/24/2017   . COPD (chronic obstructive pulmonary disease) (CMS HCC)    . CPAP (continuous positive airway pressure) dependence 10/18/2017    has not used recently   . Depression    . Diabetes mellitus, type 2 (CMS HCC)    . DM (diabetes mellitus) (CMS HCC) 2000    Fasting BG 200's. HGA1C 2018 6   . Edema    . Essential hypertension    . GERD (gastroesophageal reflux disease)    . Headache    . Heartburn    . Hx  antineoplastic chemo 2018   . Hx of radiation therapy 2018   . Hypercholesteremia    . Hyperlipidemia    . Migraine 10/18/2017    none recently   . Obesity    . Palpitations    . Pancreatic cancer (CMS [place]) 03/2017   . Panic attack    . PE (pulmonary thromboembolism) (CMS HCC) 03/29/2018   . Peripheral neuropathy 10/18/2017    knees down, bilateral   . Sepsis (CMS [place]) 04/15/2019   . Sleep apnea    . Type 2 diabetes mellitus (CMS HCC) 10/18/2017    Dx 2000 FBS 200s   . Unintentional weight loss 10/18/2017    25 lbs 03/2017   . UTI (urinary tract infection)    . Wears glasses       Past Surgical History:   Procedure Laterality Date   . CESAREAN SECTION     . COLONOSCOPY  10/04/2019    Diverticuli a colonoscopy by Dr. Tonye Becket   . HX CESAREAN SECTION     . HX CHOLECYSTECTOMY     . HX HERNIA REPAIR     . HX SUBCLAVIAN PORT IMPLANTION  s/p removed   . HX SUBCLAVIAN PORT IMPLANTION     . HX TONSIL AND ADENOIDECTOMY     . HX TONSILLECTOMY     . PANCREATICODUODENECTOMY  2018   . UMBILICAL HERNIA REPAIR      x2   . WHIPPLE PROCEDURE W/ LAPAROSCOPY        Social History     Tobacco Use   . Smoking status: Former Smoker     Packs/day: 0.50     Years: 20.00     Pack years: 10.00     Types: Cigarettes   . Smokeless tobacco: Never Used   . Tobacco comment: unsure of quit date   Vaping Use   . Vaping Use: Never used   Substance Use Topics   . Alcohol use: Never   . Drug use: Never       Family Medical History:     Problem Relation (Age of Onset)    Alzheimer's/Dementia Father (39)    Cancer Sister    Diabetes Mother    Heart Attack Mother (41)    Heart Disease Mother, Brother    Hypertension (High Blood Pressure) Other    Lung disease Father    Other Mother (37)    Pacemaker Sister    Respiratory Problems Father         Medications Prior to Admission     Prescriptions    Acetaminophen-Butalbital 50-325 mg Oral Tablet    Take by mouth    aspirin (ECOTRIN) 81 mg Oral Tablet, Delayed Release (E.C.)    Take 1 Tab (81 mg total) by  mouth Once a day    atorvastatin (LIPITOR) 10 mg Oral Tablet    TAKE 1 TABLET BY MOUTH ONCE DAILY    cetirizine (ZYRTEC) 10 mg Oral Tablet    Take 1 Tab (10 mg total) by mouth Once a day    dilTIAZem (CARDIZEM) 30 mg Oral Tablet    Take 1 Tablet (30 mg total) by mouth Four times a day for 30 days    furosemide (LASIX) 80 mg Oral Tablet    Take 1.5 Tablets (120 mg total) by mouth Once a day for 90 days    hydrALAZINE (APRESOLINE) 50 mg Oral Tablet    Take 0.5 Tablets (25 mg total) by mouth Twice daily for 30 days    insulin aspart U-100 (NOVOLOG FLEXPEN U-100 INSULIN) 100 unit/mL (3 mL) Subcutaneous Insulin Pen    7 Units by Subcutaneous route Twice a day before meals    Patient taking differently:  by Subcutaneous route Twice a day before meals Per sliding scale    insulin glargine (LANTUS SOLOSTAR U-100 INSULIN) 100 unit/mL Subcutaneous Insulin Pen    10 Units by Subcutaneous route Every night    lidocaine HCL (XYLOCAINE) 2 % Mucous Membrane Solution    5 mL    LORazepam (ATIVAN) 0.5 mg Oral Tablet    Take 1 Tablet (0.5 mg total) by mouth Every evening    losartan (COZAAR) 100 mg Oral Tablet    Take 1 Tablet (100 mg total) by mouth Once a day for 90 days    metoprolol succinate (TOPROL-XL) 50 mg Oral Tablet Sustained Release 24 hr    Take 1 Tablet (50 mg total) by mouth Once a day    NANO PEN NEEDLE 32 gauge x 5/32" Needle    USE WITH INSULIN FLEXPEN BID AS NEEDED    nitrofurantoin (MACROBID) 100 mg Oral Capsule    Take 1 Capsule (  100 mg total) by mouth Every night for 30 days    nitroGLYCERIN (NITROSTAT) 0.4 mg Sublingual Tablet, Sublingual    1 Tablet (0.4 mg total) by Sublingual route Every 5 minutes as needed for Chest pain for 3 doses over 15 minutes    omeprazole (PRILOSEC) 40 mg Oral Capsule, Delayed Release(E.C.)    Take 1 Capsule (40 mg total) by mouth Once a day    ondansetron (ZOFRAN) 4 mg Oral Tablet    Take 1 Tablet (4 mg total) by mouth Every 6 hours as needed for Nausea/Vomiting for up to 30 days     oxyCODONE-acetaminophen (PERCOCET) 5-325 mg Oral Tablet    Take 1 Tablet by mouth Every 6 hours as needed for Pain for up to 30 days    pancreatic enzyme replacement (CREON) 12,000-38,000 -60,000 unit Oral Capsule, Delayed Release(E.C.)    Take 2 Capsules by mouth Three times daily with meals 2 CAPS WITH MEALS AND 1 CAP WITH EACH SNACK    sodium chloride 1 gram Oral Tablet    Take 1 Tablet (1 g total) by mouth Three times daily with meals for 30 days    tiotropium bromide (SPIRIVA HANDIHALER) 18 mcg Inhalation Capsule, w/Inhalation Device    Take 1 Capsule (18 mcg total) by inhalation Once a day for 180 days         Allergies   Allergen Reactions   . Sulfa (Sulfonamides) Itching        Review of Systems   Constitutional: Negative for chills, fever and malaise/fatigue.   HENT: Negative for congestion, ear pain, sore throat and tinnitus.    Eyes: Negative for blurred vision, photophobia and pain.   Respiratory: Positive for shortness of breath. Negative for cough, hemoptysis, sputum production and wheezing.    Cardiovascular: Positive for chest pain. Negative for palpitations, orthopnea, claudication, leg swelling and PND.   Gastrointestinal: Negative for abdominal pain, diarrhea, heartburn, nausea and vomiting.   Genitourinary: Negative for dysuria, frequency and urgency.   Musculoskeletal: Negative for back pain, joint pain and myalgias.   Skin: Negative for rash.   Neurological: Negative for dizziness, sensory change, speech change, focal weakness, weakness and headaches.   Endo/Heme/Allergies: Negative for environmental allergies. Does not bruise/bleed easily.   Psychiatric/Behavioral: Negative for depression and substance abuse. The patient is not nervous/anxious.           Filed Vitals:    05/12/20 1430 05/12/20 1451 05/12/20 1501 05/12/20 1549   BP: (!) 210/86  (!) 187/61    Pulse: 83  71    Resp: (!) 22  20    Temp:   36.7 C (98 F)    SpO2: 100% 99%  98%       Physical Exam  Constitutional:       General:  She is awake. She is not in acute distress.     Appearance: Normal appearance. She is well-developed and normal weight. She is not ill-appearing.   HENT:      Head: Normocephalic and atraumatic.   Eyes:      Conjunctiva/sclera: Conjunctivae normal.      Pupils: Pupils are equal, round, and reactive to light.   Cardiovascular:      Rate and Rhythm: Normal rate and regular rhythm.      Heart sounds: Murmur heard.   Systolic murmur is present with a grade of 2/6.   No friction rub. No gallop.    Pulmonary:      Effort: No respiratory distress.  Breath sounds: Normal breath sounds and air entry. No wheezing or rales.   Abdominal:      General: Bowel sounds are normal. There is no distension.      Palpations: Abdomen is soft. There is no mass.      Tenderness: There is no abdominal tenderness. There is no guarding or rebound.   Musculoskeletal:         General: No deformity. Normal range of motion.      Cervical back: Normal range of motion and neck supple.   Skin:     General: Skin is warm and dry.      Findings: No erythema or rash.   Neurological:      Mental Status: She is alert and oriented to person, place, and time. Mental status is at baseline.      Cranial Nerves: No cranial nerve deficit.   Psychiatric:         Mood and Affect: Affect normal.         Behavior: Behavior is cooperative.         Cognition and Memory: Cognition and memory normal.         Laboratory Data:     Results for orders placed or performed during the hospital encounter of 05/12/20 (from the past 24 hour(s))   ECG 12-LEAD   Result Value Ref Range    Ventricular rate 70 BPM    Atrial Rate 70 BPM    PR Interval 214 ms    QRS Duration 98 ms    QT Interval 424 ms    QTC Calculation 457 ms    Calculated P Axis 55 degrees    Calculated R Axis -18 degrees    Calculated T Axis 24 degrees   COMPREHENSIVE METABOLIC PANEL, NON-FASTING   Result Value Ref Range    SODIUM 134 (L) 136 - 145 mmol/L    POTASSIUM 3.9 3.5 - 5.1 mmol/L    CHLORIDE 101 96 -  111 mmol/L    CO2 TOTAL 22 (L) 23 - 31 mmol/L    ANION GAP 11 4 - 13 mmol/L    BUN 22 8 - 25 mg/dL    CREATININE 0.84 0.60 - 1.05 mg/dL    BUN/CREA RATIO 26 (H) 6 - 22    ESTIMATED GFR 70 >=60 mL/min/BSA    ALBUMIN 2.9 (L) 3.4 - 4.8 g/dL     CALCIUM 8.8 8.8 - 10.2 mg/dL    GLUCOSE 86 65 - 125 mg/dL    ALKALINE PHOSPHATASE 111 55 - 145 U/L    ALT (SGPT) 7 (L) 8 - 22 U/L    AST (SGOT)  18 8 - 45 U/L    BILIRUBIN TOTAL 0.3 0.3 - 1.3 mg/dL    PROTEIN TOTAL 5.0 (L) 6.0 - 8.0 g/dL   TROPONIN-I   Result Value Ref Range    TROPONIN I 27 7 - 30 ng/L   CBC WITH DIFF   Result Value Ref Range    WBC 3.5 (L) 3.7 - 11.0 x10^3/uL    RBC 3.47 (L) 3.85 - 5.22 x10^6/uL    HGB 9.9 (L) 11.5 - 16.0 g/dL    HCT 31.3 (L) 34.8 - 46.0 %    MCV 90.2 78.0 - 100.0 fL    MCH 28.5 26.0 - 32.0 pg    MCHC 31.6 31.0 - 35.5 g/dL    RDW-CV 13.5 11.5 - 15.5 %    PLATELETS 107 (L) 150 - 400 x10^3/uL    MPV  11.5 8.7 - 12.5 fL    NEUTROPHIL % 70 %    LYMPHOCYTE % 17 %    MONOCYTE % 10 %    EOSINOPHIL % 2 %    BASOPHIL % 1 %    NEUTROPHIL # 2.43 1.50 - 7.70 x10^3/uL    LYMPHOCYTE # 0.59 (L) 1.00 - 4.80 x10^3/uL    MONOCYTE # 0.34 0.20 - 1.10 x10^3/uL    EOSINOPHIL # <0.10 <=0.50 x10^3/uL    BASOPHIL # <0.10 <=0.20 x10^3/uL    IMMATURE GRANULOCYTE % 0 0 - 1 %    IMMATURE GRANULOCYTE # <0.10 <0.10 x10^3/uL   BLUE TOP TUBE   Result Value Ref Range    RAINBOW/EXTRA TUBE AUTO RESULT Yes    GOLD TOP TUBE   Result Value Ref Range    RAINBOW/EXTRA TUBE AUTO RESULT Yes    LIGHT GREEN TOP TUBE   Result Value Ref Range    RAINBOW/EXTRA TUBE AUTO RESULT Yes    LAVENDER TOP TUBE   Result Value Ref Range    RAINBOW/EXTRA TUBE AUTO RESULT Yes    GRAY TOP TUBE   Result Value Ref Range    RAINBOW/EXTRA TUBE AUTO RESULT Yes    POC BLOOD GLUCOSE (RESULTS)   Result Value Ref Range    GLUCOSE, POC 126 (H) 60 - 110 mg/dl       Imaging Studies:    XR AP MOBILE CHEST   Final Result by Edi, Radresults In (06/01 1612)   Current study is negative for active disease.       Port-A-Cath is satisfactory.      This note was partially created using voice recognition software and is   inherently subject to errors including those of syntax and "sound alike "   substitutions which may escape proof reading.  In such instances, original   meaning may be extrapolated by contextual derivation.         Radiologist location ID: [id number]             Assessment/Plan:  Active Hospital Problems    Diagnosis   . Primary Problem: Chest pain   . 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 CHF (congestive heart failure) (CMS HCC)   . COPD (chronic obstructive pulmonary disease) (CMS HCC)   . Essential hypertension   . Hyperlipidemia   . Asthma       . Chest pain.  Rule out MI.  Will admit the patient to med surge on telemetry.  Will obtain serial cardiac enzymes q.8 hours for a total 3 sets.  Will repeat chest x-ray and labs in the morning.  Will order an inpatient stress test for in the morning.  Will continue to follow.  .  Cirrhosis of the liver with ascites.  Chronic and stable.  . CHF.  Continue medications.  Marland Kitchen COPD continue Spiriva.  . Hypertension.  Continue Cardizem hydralazine Cozaar and metoprolol.  . Hyperlipidemia.  Continue Lipitor.  . DVT prophylaxis.  Lovenox.    Bethanie Dicker, MD

## 2020-05-12 NOTE — Respiratory Therapy (Signed)
Pt doing well with Respiratory condition. Gave PT home medication.

## 2020-05-12 NOTE — ED Provider Notes (Signed)
[facility]  Emergency Department  Provider Note      [name]  May 16, 1946  74 y.o.  female  [place] [address]   [phone] (home)  Myrene Buddy, MD    Chief Complaint:   Chief Complaint   Patient presents with   . Chest Pain        HPI: This is a 74 y.o. female who presents to the emergency department emergency medical services with complaints of left-sided chest pain that she woke up with this morning at approximately 9:45 a.m.Marland Kitchen  Patient was given nitroglycerin x2 at home.  She was then given aspirin 324 mg and nitroglycerin x1 by emergency medical services.  Patient reports the nitroglycerin helped her chest pain significantly and now it is currently a 2/10.  She does have some shortness of breath.  There are no exacerbating factors.    COVID screening:  Patient denies shortness of breath, cough, and lost of taste or smell.  Patient has had no recent travel or known COVID exposures.    Past Medical History:   Past Medical History:   Diagnosis Date   . Abdominal hernia 10/18/2017    hx of repair   . Anxiety    . Arthritis    . Asthma    . Atrial fibrillation (CMS HCC)    . Back problem    . Bruises easily    . Cancer (CMS [place]) 10/18/2017    chemo and radiation completed 09/24/2017   . COPD (chronic obstructive pulmonary disease) (CMS HCC)    . CPAP (continuous positive airway pressure) dependence 10/18/2017    has not used recently   . Depression    . DM (diabetes mellitus) (CMS HCC) 2000    Fasting BG 200's. HGA1C 2018 6   . Edema    . Essential hypertension    . GERD (gastroesophageal reflux disease)    . Headache    . Heartburn    . Hx antineoplastic chemo 2018   . Hx of radiation therapy 2018   . Hypercholesteremia    . Hyperlipidemia    . Migraine 10/18/2017    none recently   . Obesity    . Palpitations    . Pancreatic cancer (CMS [place]) 03/2017   . Panic attack    . PE (pulmonary thromboembolism) (CMS HCC) 03/29/2018   . Peripheral neuropathy 10/18/2017    knees down,  bilateral   . Sepsis (CMS [place]) 04/15/2019   . Sleep apnea    . Type 2 diabetes mellitus (CMS HCC) 10/18/2017    Dx 2000 FBS 200s   . Unintentional weight loss 10/18/2017    25 lbs 03/2017   . UTI (urinary tract infection)    . Wears glasses        Past Surgical History:   Past Surgical History:   Procedure Laterality Date   . Cesarean section     . Colonoscopy  10/04/2019   . Hx cesarean section     . Hx cholecystectomy     . Hx hernia repair     . Hx subclavian port implantion     . Hx subclavian port implantion     . Hx tonsil and adenoidectomy     . Hx tonsillectomy     . Pancreaticoduodenectomy  2018   . Umbilical hernia repair     . Whipple procedure w/ laparoscopy         Social History:   Social History  Tobacco Use   . Smoking status: Former Smoker     Packs/day: 0.50     Years: 20.00     Pack years: 10.00     Types: Cigarettes   . Smokeless tobacco: Never Used   . Tobacco comment: unsure of quit date   Vaping Use   . Vaping Use: Never used   Substance Use Topics   . Alcohol use: Never   . Drug use: Never      Social History     Substance and Sexual Activity   Drug Use Never       Allergies:   Allergies   Allergen Reactions   . Sulfa (Sulfonamides) Itching       Medications:   Prior to Admission medications    Medication Sig Start Date End Date Taking? Authorizing Provider   Acetaminophen-Butalbital 50-325 mg Oral Tablet Take by mouth    Provider, Historical   aspirin (ECOTRIN) 81 mg Oral Tablet, Delayed Release (E.C.) Take 1 Tab (81 mg total) by mouth Once a day 04/20/19   Hyacinth Meeker, MD   atorvastatin (LIPITOR) 10 mg Oral Tablet TAKE 1 TABLET BY MOUTH ONCE DAILY 01/13/20   Ignacia Felling, MD   cetirizine (ZYRTEC) 10 mg Oral Tablet Take 1 Tab (10 mg total) by mouth Once a day 04/20/19   Hyacinth Meeker, MD   dilTIAZem (CARDIZEM) 30 mg Oral Tablet Take 1 Tablet (30 mg total) by mouth Four times a day for 30 days 03/02/20 04/16/20  Myrene Buddy, MD   furosemide (LASIX) 80 mg Oral Tablet Take  1.5 Tablets (120 mg total) by mouth Once a day for 90 days 04/16/20 07/15/20  Myrene Buddy, MD   hydrALAZINE (APRESOLINE) 50 mg Oral Tablet Take 0.5 Tablets (25 mg total) by mouth Twice daily for 30 days 02/11/20 04/16/20  Myrene Buddy, MD   insulin aspart U-100 (NOVOLOG FLEXPEN U-100 INSULIN) 100 unit/mL (3 mL) Subcutaneous Insulin Pen 7 Units by Subcutaneous route Twice a day before meals  Patient taking differently: by Subcutaneous route Twice a day before meals Per sliding scale 03/26/20   Myrene Buddy, MD   insulin glargine (LANTUS SOLOSTAR U-100 INSULIN) 100 unit/mL Subcutaneous Insulin Pen 10 Units by Subcutaneous route Every night 11/25/19   Myrene Buddy, MD   lidocaine HCL (XYLOCAINE) 2 % Mucous Membrane Solution 5 mL 05/02/17   Provider, Historical   LORazepam (ATIVAN) 0.5 mg Oral Tablet Take 1 Tablet (0.5 mg total) by mouth Every evening 03/02/20   Myrene Buddy, MD   losartan (COZAAR) 100 mg Oral Tablet Take 1 Tablet (100 mg total) by mouth Once a day for 90 days 03/23/20 06/21/20  Myrene Buddy, MD   metoprolol succinate (TOPROL-XL) 50 mg Oral Tablet Sustained Release 24 hr Take 1 Tablet (50 mg total) by mouth Once a day 02/07/20   Bethanie Dicker, MD   NANO PEN NEEDLE 32 gauge x 5/32" Needle USE WITH INSULIN FLEXPEN BID AS NEEDED 08/06/19   Provider, Historical   nitrofurantoin (MACROBID) 100 mg Oral Capsule Take 1 Capsule (100 mg total) by mouth Every night for 30 days 04/30/20 05/30/20  Myrene Buddy, MD   omeprazole (PRILOSEC) 40 mg Oral Capsule, Delayed Release(E.C.) Take 1 Capsule (40 mg total) by mouth Once a day 04/16/20   Myrene Buddy, MD   ondansetron (ZOFRAN) 4 mg Oral Tablet Take 1 Tablet (4 mg total) by mouth Every 6 hours as needed for Nausea/Vomiting for  up to 30 days 04/16/20 05/16/20  Myrene Buddy, MD   oxyCODONE-acetaminophen (PERCOCET) 5-325 mg Oral Tablet Take 1 Tablet by mouth Every 6 hours as needed for Pain for up to 30 days 04/16/20 05/16/20  Myrene Buddy, MD   pancreatic enzyme replacement  (CREON) 12,000-38,000 -60,000 unit Oral Capsule, Delayed Release(E.C.) Take 2 Capsules by mouth Three times daily with meals 2 CAPS WITH MEALS AND 1 CAP WITH EACH [facility] 02/17/20   Myrene Buddy, MD   sodium chloride 1 gram Oral Tablet Take 1 Tablet (1 g total) by mouth Three times daily with meals for 30 days 03/02/20 04/16/20  Myrene Buddy, MD   tiotropium bromide (SPIRIVA HANDIHALER) 18 mcg Inhalation Capsule, w/Inhalation Device Take 1 Capsule (18 mcg total) by inhalation Once a day for 180 days 02/24/20 08/22/20  Myrene Buddy, MD   spironolactone (ALDACTONE) 25 mg Oral Tablet Take 1 Tablet (25 mg total) by mouth Once a day for 180 days 02/11/20 02/27/20  Myrene Buddy, MD       Above history, allergies and medication list reviewed with patient.      Review of Systems:  Review of systems as below.  Additional systems reviewed in HPI.   Consitutional:  Negative for fever, chills and fatigue/malaise.  Skin:  Negative for rashes or other lesions.  HENT:  Negative for headaches.  Eyes:  Negative for blurry vision, double vision or acute vision loss.  Cardiovascular:  Positive for chest pain and negative for palpitations.  Respiratory:  Positive for dyspnea.  Negative for cough,and wheezing.  Gastrointestinal:  Negative for nausea, vomiting and abdominal pain.  Negative for diarrhea, constipation, melena or hematochezia.  Musculoskeletal:  Negative for neck pain and back pain.  Neurological:  Negative for dizziness, loss of consciousness, numbness and tingling. No acute localized weakness.  Psych:  Negative for anxiety or depression.  No suicidal ideations or attempts.  No homicidal ideations or attempts.  Genitourinary:  Negative for dysuria, hematuria and flank pain.  All other systems reviewed and negative unless documented otherwise.      Physical Exam  Body mass index is 35.85 kg/m.  ED Triage Vitals [05/12/20 1126]   BP (Non-Invasive) (!) 178/68   Heart Rate 66   Respiratory Rate 18   Temperature 35.8 C (96.4 F)    SpO2 99 %   Weight 88.9 kg (196 lb)   Height 1.575 m (5\' 2" )     Constitutional: patient is oriented to person, place, and time and well-developed, well-nourished, and in no distress.  Nontoxic on exam.  HENT:   Mask in place.  Head: Normocephalic and atraumatic.   Right Ear: External ear normal.   Left Ear: External ear normal.   Nose: Nose normal.   Mouth/Throat: Oropharynx is clear and moist. Uvula is midline. Airway is patent.  Eyes: Pupils are equal, round, and reactive to light. Conjunctivae and EOM are normal.   Neck: Normal range of motion. Neck supple.  Trachea midline.  Cardiovascular: Normal rate, regular rhythm, normal heart sounds and intact distal pulses.   Pulmonary/Chest: Effort normal and breath sounds normal.  No chest wall deformity.  Abdominal: Non-tender. Soft. No ascites. Bowel sounds are normal.  No rebound tenderness or guarding.  Musculoskeletal: Normal range of motion for patient's baseline.  Neurological: Patient is alert and oriented to person, place, and time. GCS score is 15. Gait is at patient's baseline.  Skin: Skin is warm and dry.   Psychiatric: Mood, memory,  affect and judgment normal.        ED Course/ MDM/ Plan:   Patient was triaged, vital signs were obtained, patient was  placed in a room.  On exam, patient is alert and oriented, nontoxic on exam, and in no acute distress. Vitals were reviewed. The following orders were placed after examining the patient and the following results were reviewed by me.    Orders Placed This Encounter   . XR AP MOBILE CHEST   . EXTRA TUBES   . BLUE TOP TUBE   . GOLD TOP TUBE   . LIGHT GREEN TOP TUBE   . LAVENDER TOP TUBE   . GRAY TOP TUBE   . CBC/DIFF   . COMPREHENSIVE METABOLIC PANEL, NON-FASTING   . TROPONIN-I   . CBC WITH DIFF   . COVID-19 SCREENING - Placement - NON-PUI   . ECG 12-LEAD   . INSERT & MAINTAIN PERIPHERAL IV ACCESS   . nitroGLYCERIN (NITRO-BID) 2 % topical ointment   . hydrALAZINE (APRESOLINE) injection 10 mg     XR AP MOBILE  CHEST    (Results Pending)     Labs Reviewed   COMPREHENSIVE METABOLIC PANEL, NON-FASTING - Abnormal; Notable for the following components:       Result Value    SODIUM 134 (*)     CO2 TOTAL 22 (*)     BUN/CREA RATIO 26 (*)     ALBUMIN 2.9 (*)     ALT (SGPT) 7 (*)     PROTEIN TOTAL 5.0 (*)     All other components within normal limits   CBC WITH DIFF - Abnormal; Notable for the following components:    WBC 3.5 (*)     RBC 3.47 (*)     HGB 9.9 (*)     HCT 31.3 (*)     PLATELETS 107 (*)     LYMPHOCYTE # 0.59 (*)     All other components within normal limits   TROPONIN-I - Normal   CBC/DIFF    Narrative:     The following orders were created for panel order CBC/DIFF.  Procedure                               Abnormality         Status                     ---------                               -----------         ------                     CBC WITH [id number]                Abnormal            Final result                 Please view results for these tests on the individual orders.   EXTRA TUBES    Narrative:     The following orders were created for panel order EXTRA TUBES.  Procedure                               Abnormality  Status                     ---------                               -----------         ------                     BLUE TOP [id number]                                    In process                 GOLD TOP [id number]                                    In process                 LIGHT GREEN TOP TUBE[[id number]]                             In process                 LAVENDER TOP [id number]                                In process                 GRAY TOP TUBE[[id number]]                                    In process                   Please view results for these tests on the individual orders.   BLUE TOP TUBE   GOLD TOP TUBE   LIGHT GREEN TOP TUBE   LAVENDER TOP TUBE   GRAY TOP TUBE   COVID-19 [facility] MOLECULAR LAB TESTING     Results for orders placed or performed during  the hospital encounter of 05/12/20 (from the past 12 hour(s))   COMPREHENSIVE METABOLIC PANEL, NON-FASTING   Result Value Ref Range    SODIUM 134 (L) 136 - 145 mmol/L    POTASSIUM 3.9 3.5 - 5.1 mmol/L    CHLORIDE 101 96 - 111 mmol/L    CO2 TOTAL 22 (L) 23 - 31 mmol/L    ANION GAP 11 4 - 13 mmol/L    BUN 22 8 - 25 mg/dL    CREATININE 0.84 0.60 - 1.05 mg/dL    BUN/CREA RATIO 26 (H) 6 - 22    ESTIMATED GFR 70 >=60 mL/min/BSA    ALBUMIN 2.9 (L) 3.4 - 4.8 g/dL     CALCIUM 8.8 8.8 - 10.2 mg/dL    GLUCOSE 86 65 - 125 mg/dL    ALKALINE PHOSPHATASE 111 55 - 145 U/L    ALT (SGPT) 7 (L) 8 - 22 U/L    AST (SGOT)  18 8 - 45 U/L    BILIRUBIN TOTAL 0.3 0.3 - 1.3 mg/dL    PROTEIN TOTAL 5.0 (L) 6.0 - 8.0 g/dL  TROPONIN-I   Result Value Ref Range    TROPONIN I 27 7 - 30 ng/L   CBC WITH DIFF   Result Value Ref Range    WBC 3.5 (L) 3.7 - 11.0 x10^3/uL    RBC 3.47 (L) 3.85 - 5.22 x10^6/uL    HGB 9.9 (L) 11.5 - 16.0 g/dL    HCT 31.3 (L) 34.8 - 46.0 %    MCV 90.2 78.0 - 100.0 fL    MCH 28.5 26.0 - 32.0 pg    MCHC 31.6 31.0 - 35.5 g/dL    RDW-CV 13.5 11.5 - 15.5 %    PLATELETS 107 (L) 150 - 400 x10^3/uL    MPV 11.5 8.7 - 12.5 fL    NEUTROPHIL % 70 %    LYMPHOCYTE % 17 %    MONOCYTE % 10 %    EOSINOPHIL % 2 %    BASOPHIL % 1 %    NEUTROPHIL # 2.43 1.50 - 7.70 x10^3/uL    LYMPHOCYTE # 0.59 (L) 1.00 - 4.80 x10^3/uL    MONOCYTE # 0.34 0.20 - 1.10 x10^3/uL    EOSINOPHIL # <0.10 <=0.50 x10^3/uL    BASOPHIL # <0.10 <=0.20 x10^3/uL    IMMATURE GRANULOCYTE % 0 0 - 1 %    IMMATURE GRANULOCYTE # <0.10 <0.10 x10^3/uL     No orders to display         The patient received the following medications while in the ED (also noted above):  Medications   nitroGLYCERIN (NITRO-BID) 2 % topical ointment (0.5 Inches Apply Topically Given 05/12/20 1200)       ED Course as of May 12 1341   Tue May 12, 2020   1137 Sinus rhythm with 1st degree AV block.  Occasional PVCs.  70 beats per minute.  Normal axis.  No acute ST elevation or depression.  No acute T-wave changes.    ECG 12-LEAD [CT]   1207 Low   WBC(!): 3.5 [CT]   1207 Low   HGB(!): 9.9 [CT]   1207 Low   HCT(!): 31.3 [CT]   1207 Mild anemia appears to be chronic.    [CT]   1231 low   SODIUM(!): 134 [CT]   1231 low   CARBON DIOXIDE(!): 22 [CT]   1231 low   ALBUMIN(!): 2.9 [CT]   1231 low   ALT (SGPT)(!): 7 [CT]   1231 low   TOTAL PROTEIN(!): 5.0 [CT]   1318 wnl   TROPONIN-I: 27 [CT]      ED Course User Index  [CT] Thayer-Hughes, Trinitie Mcgirr, PA-C         Procedures  None    This is a 74 year old female presenting to the ER with complaints of chest pain that was relieved with nitroglycerin.  Patient was thought to benefit from admission for further cardiac evaluation.  I discussed this treatment plan with the patient she was in agreement.  I discussed the patient's case with the hospitalist, Etrulia Crews PA-C he will admit the patient.  Patient was also given hydralazine 10 mg for her blood pressure.  She remained hemodynamically stable for admission med surge telemetry.    Critical Care:     Not applicable.      Clinical Impression:   Encounter Diagnosis   Name Primary?   . Chest pain, unspecified type Yes       Patient admitted for further evaluation    Disposition: Admitted  New Prescriptions    NITROGLYCERIN (NITROSTAT) 0.4 MG SUBLINGUAL TABLET, SUBLINGUAL  1 Tablet (0.4 mg total) by Sublingual route Every 5 minutes as needed for Chest pain for 3 doses over 15 minutes      No follow-up provider specified.   BP (!) 205/65   Pulse 66   Temp 35.8 C (96.4 F)   Resp 18   Ht 1.575 m (5\' 2" )   Wt 88.9 kg (196 lb)   SpO2 99%   BMI 35.85 kg/m          Thijs Brunton Thayer-Hughes, PA-C       This note was partially created using voice recognition software and is inherently subject to errors including those of syntax and "sound alike " substitutions which may escape proof reading.  In such instances, original meaning may be extrapolated by contextual derivation.

## 2020-05-12 NOTE — Telephone Encounter (Signed)
Regarding: Prescription Question  ----- Message from Michelene Heady, RN sent at 05/12/2020 11:50 AM EDT -----       ----- Message from [place], [name] to Myrene Buddy, MD sent at 05/12/2020 10:24 AM -----   Good morning Dr. [name],    [name] awoke with chest pains this morning. I checked her vitals and they were stable but realized at this time that we didn't have the previously discussed nitro medication for her. Thankfully my husband has some 0.4 mg in which I have at this time given her 2 after calling an ambulance to our home. I wanted to touch base with you so that we could obtain a prescription for her and give you heads up of the situation at hand. If you have any questions or concerns please feel free to contact me.     With the second dose she is stating the pain is easing up to 4/10 from 8/10 when it first began.    Voncile Denbow   [id number] [phone] cell    I will keep you updated - thank you

## 2020-05-12 NOTE — Telephone Encounter (Signed)
Rx for NTG sent to the [facility] in [place], [place]

## 2020-05-12 NOTE — Nurses Notes (Signed)
Arrived to 1130 via gurney from ER at this time without incident. resp even and nonlabored, 99% on room air. mediport access intact, normal saline started per RN. Tele #5 applied, NSR noted. Denied pain, nausea, vomiting at present. + 3 edema to left lower leg with redness and +3 edema to right lower leg per RN. Elevated blood pressure, notified MD of issues and new orders received. Oriented to room. Meal and po fluids provided. Call light within reach.

## 2020-05-12 NOTE — Nurses Notes (Signed)
Blood pressure recheck 174/56. Notified Dr. Purvis Kilts.

## 2020-05-12 NOTE — ED Nurses Note (Signed)
Family updated - admission room and pt code provided

## 2020-05-12 NOTE — Telephone Encounter (Signed)
Pt's family notified and she states that a whole tablet of oxycodone knocks her out and that they think that she c/o pain more for attention when people are at her house. Pt. C/o chest pain today and is at the ER right now.  Pt's family is asking for a Rx for nitroglycerin. Thanks. Michelene Heady, RN

## 2020-05-12 NOTE — Care Plan (Signed)
Problem: Adult Inpatient Plan of Care  Goal: Plan of Care Review  Outcome: Ongoing (see interventions/notes)     Problem: Fall Injury Risk  Goal: Absence of Fall and Fall-Related Injury  Outcome: Ongoing (see interventions/notes)     Problem: Skin Injury Risk Increased  Goal: Skin Health and Integrity  Outcome: Ongoing (see interventions/notes)

## 2020-05-12 NOTE — ED Nurses Note (Signed)
Attempted to call report.  Assigned nurse will return call.

## 2020-05-12 NOTE — Telephone Encounter (Signed)
This patient can increase her pain meds to 1 whole tablet.  She has pancreatic cancer.  Pain is expected.  drmark

## 2020-05-12 NOTE — Care Plan (Signed)
Problem: Adult Inpatient Plan of Care  Goal: Plan of Care Review  Outcome: Ongoing (see interventions/notes)  Goal: Patient-Specific Goal (Individualized)  Outcome: Ongoing (see interventions/notes)  Goal: Absence of Hospital-Acquired Illness or Injury  Outcome: Ongoing (see interventions/notes)  Goal: Optimal Comfort and Wellbeing  Outcome: Ongoing (see interventions/notes)  Goal: Rounds/Family Conference  Outcome: Ongoing (see interventions/notes)     Problem: Fall Injury Risk  Goal: Absence of Fall and Fall-Related Injury  Outcome: Ongoing (see interventions/notes)     Problem: Skin Injury Risk Increased  Goal: Skin Health and Integrity  Outcome: Ongoing (see interventions/notes)     Problem: Chest Pain  Goal: Resolution of Chest Pain Symptoms  Outcome: Ongoing (see interventions/notes)     Problem: Asthma Comorbidity  Goal: Maintenance of Asthma Control  Outcome: Ongoing (see interventions/notes)     Problem: COPD Comorbidity  Goal: Maintenance of COPD Symptom Control  Outcome: Ongoing (see interventions/notes)     Problem: Diabetes Comorbidity  Goal: Blood Glucose Level Within Targeted Range  Outcome: Ongoing (see interventions/notes)     Problem: Heart Failure Comorbidity  Goal: Maintenance of Heart Failure Symptom Control  Outcome: Ongoing (see interventions/notes)     Problem: Hypertension Comorbidity  Goal: Blood Pressure in Desired Range  Outcome: Ongoing (see interventions/notes)

## 2020-05-13 ENCOUNTER — Observation Stay (HOSPITAL_COMMUNITY): Payer: Medicare Other

## 2020-05-13 LAB — BASIC METABOLIC PANEL
ANION GAP: 8 mmol/L (ref 4–13)
BUN/CREA RATIO: 23 — ABNORMAL HIGH (ref 6–22)
BUN: 22 mg/dL (ref 8–25)
CALCIUM: 8.2 mg/dL — ABNORMAL LOW (ref 8.8–10.2)
CHLORIDE: 103 mmol/L (ref 96–111)
CO2 TOTAL: 20 mmol/L — ABNORMAL LOW (ref 23–31)
CREATININE: 0.95 mg/dL (ref 0.60–1.05)
ESTIMATED GFR: 60 mL/min/BSA (ref >=60)
GLUCOSE: 208 mg/dL — ABNORMAL HIGH (ref 65–125)
POTASSIUM: 4.1 mmol/L (ref 3.5–5.1)
SODIUM: 131 mmol/L — ABNORMAL LOW (ref 136–145)

## 2020-05-13 LAB — CBC WITH DIFF
BASOPHIL #: <0.1 10*3/uL (ref <=0.20)
BASOPHIL %: 1 %
EOSINOPHIL #: <0.1 10*3/uL (ref <=0.50)
EOSINOPHIL %: 3 %
HCT: 26.9 % — ABNORMAL LOW (ref 34.8–46.0)
HGB: 8.5 g/dL — ABNORMAL LOW (ref 11.5–16.0)
IMMATURE GRANULOCYTE #: <0.1 10*3/uL (ref <0.10)
IMMATURE GRANULOCYTE %: 0 % (ref 0–1)
LYMPHOCYTE #: 0.8 10*3/uL — ABNORMAL LOW (ref 1.00–4.80)
LYMPHOCYTE %: 28 %
MCH: 28.2 pg (ref 26.0–32.0)
MCHC: 31.6 g/dL (ref 31.0–35.5)
MCV: 89.4 fL (ref 78.0–100.0)
MONOCYTE #: 0.23 10*3/uL (ref 0.20–1.10)
MONOCYTE %: 8 %
MPV: 12.1 fL (ref 8.7–12.5)
NEUTROPHIL #: 1.76 10*3/uL (ref 1.50–7.70)
NEUTROPHIL %: 60 %
PLATELETS: 88 10*3/uL — ABNORMAL LOW (ref 150–400)
RBC: 3.01 10*6/uL — ABNORMAL LOW (ref 3.85–5.22)
RDW-CV: 13.4 % (ref 11.5–15.5)
WBC: 2.9 10*3/uL — ABNORMAL LOW (ref 3.7–11.0)

## 2020-05-13 LAB — POC BLOOD GLUCOSE (RESULTS): GLUCOSE, POC: 200 mg/dl — ABNORMAL HIGH (ref 60–110)

## 2020-05-13 LAB — COVID-19 ~~LOC~~ MOLECULAR LAB TESTING: SARS-CoV-2: NOT DETECTED

## 2020-05-13 LAB — TROPONIN-I: TROPONIN I: 18 ng/L (ref 7–30)

## 2020-05-13 NOTE — Discharge Summary (Signed)
[facility]  DISCHARGE SUMMARY      PATIENT NAME:  [name], [name]  MRN:  [id number]  DOB:  November 22, 1946    INPATIENT ADMISSION DATE:   DISCHARGE DATE:  05/13/2020    ATTENDING PHYSICIAN: Bethanie Dicker, MD  SERVICE: SMR HOSPITALIST  PRIMARY CARE PHYSICIAN: Myrene Buddy, MD       DISCHARGE DIAGNOSIS:     Active Hospital Problems    Diagnosis Date Noted   . Principle Problem: Chest pain [R07.9] 03/31/2020   . 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) [K74.60, R18.8] 02/11/2020   . CHF (congestive heart failure) (CMS HCC) [I50.9] 04/13/2019   . COPD (chronic obstructive pulmonary disease) (CMS HCC) [J44.9] 04/13/2019   . Essential hypertension [I10] 04/13/2019   . Hyperlipidemia [E78.5] 04/13/2019   . Asthma [J45.909] 01/24/2017      Resolved Hospital Problems   No resolved problems to display.         REASON FOR HOSPITALIZATION AND HOSPITAL COURSE:    This is a 74 y.o., female admitted for chest pain rule out MI.  the patient presented to the emergency room via EMS with complaints of left-sided chest pain that started when she woke up this morning at approximately 9:45 a.m..  The patient describes the pain as a pressure like pain.  The patient denies any radiation.  The patient believes the pain to be cardiac related.  The patient denies previous history of MI or heart disease.  The patient does report some shortness of breath but denies any aggravating or alleviating factors.  Denies palpitations, diaphoresis, and nausea.  The patient was given nitroglycerin x2 by EMS and 325 of aspirin in route.  In the emergency department the patient's workup was benign.  She had a troponin that was in the normal range at 27. Chest x-ray was negative for active disease.  EKG showed sinus rhythm with first-degree AV block.    The patient will be admitted to the hospital for monitoring on telemetry and serial cardiac enzymes.  Telemetry revealed no evidence of ischemia or arrhythmias.  Serial cardiac enzymes  were normal; 27, 21, and 19 consecutively.  We anticipated doing an inpatient stress test, however peripheral IV access proved to be difficult.  The nuclear medicine tech stated that her port is not acceptable for use as it was too close to her heart and 1 interfere with the test.  His central line was considered but was ultimately had similar problems to the port.  We discussed this with the patient and her sister.  The discussion included whether the patient would be medically stable enough for stents or bypass surgery given her recent diagnosis of stage IV metastatic pancreatic cancer.  As result at this point we are going to discharge the patient home as she has ruled out for an acute MI.  We will have her to follow-up with her cardiologist, Dr. Tharon Aquas, as an outpatient for further evaluation and recommendations.  The patient will follow-up with PCP in 1 week.      DISCHARGE MEDICATIONS:     Current Discharge Medication List      START taking these medications.      Details   nitroGLYCERIN 0.4 mg Tablet, Sublingual  Commonly known as: NITROSTAT   0.4 mg, Sublingual, EVERY 5 MIN PRN, for 3 doses over 15 minutes  Qty: 30 Tablet  Refills: 5        CONTINUE these medications which have CHANGED during your visit.  Details   insulin aspart U-100 100 unit/mL (3 mL) Insulin Pen  Commonly known as: NovoLOG Flexpen U-100 Insulin  What changed:    how much to take   additional instructions   7 Units, Subcutaneous, 2 TIMES DAILY BEFORE MEALS  Qty: 9 Each  Refills: 3        CONTINUE these medications - NO CHANGES were made during your visit.      Details   Acetaminophen-Butalbital 50-325 mg Tablet   Oral  Refills: 0     aspirin 81 mg Tablet, Delayed Release (E.C.)  Commonly known as: ECOTRIN   81 mg, Oral, DAILY  Refills: 0     atorvastatin 10 mg Tablet  Commonly known as: LIPITOR   TAKE 1 TABLET BY MOUTH ONCE DAILY  Qty: 90 Tab  Refills: 1     cetirizine 10 mg Tablet  Commonly known as: ZYRTEC   10 mg, Oral, DAILY   Refills: 0     dilTIAZem 30 mg Tablet  Commonly known as: CARDIZEM   30 mg, Oral, 4 TIMES DAILY  Qty: 120 Tablet  Refills: 5     furosemide 80 mg Tablet  Commonly known as: LASIX   120 mg, Oral, DAILY  Qty: 135 Tablet  Refills: 0     hydrALAZINE 50 mg Tablet  Commonly known as: APRESOLINE   25 mg, Oral, 2 TIMES DAILY  Qty: 30 Tablet  Refills: 5     Lantus Solostar U-100 Insulin 100 unit/mL Insulin Pen  Generic drug: insulin glargine   10 Units, Subcutaneous, NIGHTLY  Qty: 3 mL  Refills: 5     lidocaine HCL 2 % Solution  Commonly known as: XYLOCAINE   5 mL  Refills: 0     LORazepam 0.5 mg Tablet  Commonly known as: ATIVAN   0.5 mg, Oral, EVERY EVENING  Qty: 30 Tablet  Refills: 2     losartan 100 mg Tablet  Commonly known as: COZAAR   100 mg, Oral, DAILY  Qty: 90 Tablet  Refills: 0     metoprolol succinate 50 mg Tablet Sustained Release 24 hr  Commonly known as: TOPROL-XL   50 mg, Oral, DAILY  Qty: 30 Tablet  Refills: 0     Nano Pen Needle 32 gauge x 5/32" Needle  Generic drug: insulin pen needles   USE WITH INSULIN FLEXPEN BID AS NEEDED  Refills: 0     nitrofurantoin 100 mg Capsule  Commonly known as: MACROBID   100 mg, Oral, NIGHTLY  Qty: 30 Capsule  Refills: 3     omeprazole 40 mg Capsule, Delayed Release(E.C.)  Commonly known as: PRILOSEC   40 mg, Oral, DAILY  Qty: 90 Capsule  Refills: 4     ondansetron 4 mg Tablet  Commonly known as: Zofran   4 mg, Oral, EVERY 6 HOURS PRN  Qty: 120 Tablet  Refills: 3     oxyCODONE-acetaminophen 5-325 mg Tablet  Commonly known as: PERCOCET   1 Tablet, Oral, EVERY 6 HOURS PRN  Qty: 120 Tablet  Refills: 0     pancreatic enzyme replacement 12,000-38,000 -60,000 unit Capsule, Delayed Release(E.C.)  Commonly known as: CREON   2 Capsules, Oral, 3 TIMES DAILY WITH MEALS, 2 CAPS WITH MEALS AND 1 CAP WITH EACH SNACK   Qty: 540 Capsule  Refills: 1     sodium chloride 1 gram Tablet   1 g, Oral, 3 TIMES DAILY WITH MEALS  Qty: 90 Tablet  Refills: 5     tiotropium  bromide 18 mcg Capsule,  w/Inhalation Device  Commonly known as: SPIRIVA HANDIHALER   18 mcg, Inhalation, DAILY  Qty: 30 Capsule  Refills: 5            Filed Vitals:    05/13/20 0800 05/13/20 1201 05/13/20 1432 05/13/20 1550   BP:  (!) 196/82  (!) 169/67   Pulse:  75  76   Resp:  20 16 16    Temp:  36.3 C (97.3 F)  36.6 C (97.9 F)   SpO2: 97%         Physical Exam  Constitutional:       General: She is awake. She is not in acute distress.     Appearance: Normal appearance. She is well-developed and underweight. She is not ill-appearing.   HENT:      Head: Normocephalic and atraumatic.   Eyes:      Conjunctiva/sclera: Conjunctivae normal.      Pupils: Pupils are equal, round, and reactive to light.   Cardiovascular:      Rate and Rhythm: Normal rate and regular rhythm.      Heart sounds: Murmur heard.   Systolic murmur is present with a grade of 2/6.   No friction rub. No gallop.    Pulmonary:      Effort: No respiratory distress.      Breath sounds: Normal breath sounds and air entry. No wheezing or rales.   Chest:       Abdominal:      General: Bowel sounds are normal. There is no distension.      Palpations: Abdomen is soft. There is no mass.      Tenderness: There is no abdominal tenderness. There is no guarding or rebound.   Musculoskeletal:         General: No deformity. Normal range of motion.      Cervical back: Normal range of motion and neck supple.   Skin:     General: Skin is warm and dry.      Findings: No erythema or rash.   Neurological:      Mental Status: She is alert and oriented to person, place, and time. Mental status is at baseline.      Cranial Nerves: No cranial nerve deficit.   Psychiatric:         Mood and Affect: Affect normal.         Behavior: Behavior is cooperative.         Cognition and Memory: Cognition and memory normal.         Laboratory Data:     Results for orders placed or performed during the hospital encounter of 05/12/20 (from the past 24 hour(s))   POC BLOOD GLUCOSE (RESULTS)   Result Value Ref  Range    GLUCOSE, POC 256 (H) 60 - 110 mg/dl   TROPONIN-I   Result Value Ref Range    TROPONIN I 21 7 - 30 ng/L   BASIC METABOLIC PANEL   Result Value Ref Range    SODIUM 131 (L) 136 - 145 mmol/L    POTASSIUM 4.1 3.5 - 5.1 mmol/L    CHLORIDE 103 96 - 111 mmol/L    CO2 TOTAL 20 (L) 23 - 31 mmol/L    ANION GAP 8 4 - 13 mmol/L    CALCIUM 8.2 (L) 8.8 - 10.2 mg/dL    GLUCOSE 208 (H) 65 - 125 mg/dL    BUN 22 8 - 25 mg/dL    CREATININE 0.95  0.60 - 1.05 mg/dL    BUN/CREA RATIO 23 (H) 6 - 22    ESTIMATED GFR 60 >=60 mL/min/BSA   TROPONIN-I   Result Value Ref Range    TROPONIN I 18 7 - 30 ng/L   CBC WITH DIFF   Result Value Ref Range    WBC 2.9 (L) 3.7 - 11.0 x10^3/uL    RBC 3.01 (L) 3.85 - 5.22 x10^6/uL    HGB 8.5 (L) 11.5 - 16.0 g/dL    HCT 26.9 (L) 34.8 - 46.0 %    MCV 89.4 78.0 - 100.0 fL    MCH 28.2 26.0 - 32.0 pg    MCHC 31.6 31.0 - 35.5 g/dL    RDW-CV 13.4 11.5 - 15.5 %    PLATELETS 88 (L) 150 - 400 x10^3/uL    MPV 12.1 8.7 - 12.5 fL    NEUTROPHIL % 60 %    LYMPHOCYTE % 28 %    MONOCYTE % 8 %    EOSINOPHIL % 3 %    BASOPHIL % 1 %    NEUTROPHIL # 1.76 1.50 - 7.70 x10^3/uL    LYMPHOCYTE # 0.80 (L) 1.00 - 4.80 x10^3/uL    MONOCYTE # 0.23 0.20 - 1.10 x10^3/uL    EOSINOPHIL # <0.10 <=0.50 x10^3/uL    BASOPHIL # <0.10 <=0.20 x10^3/uL    IMMATURE GRANULOCYTE % 0 0 - 1 %    IMMATURE GRANULOCYTE # <0.10 <0.10 x10^3/uL   POC BLOOD GLUCOSE (RESULTS)   Result Value Ref Range    GLUCOSE, POC 200 (H) 60 - 110 mg/dl       Imaging Studies:    XR AP MOBILE CHEST   Final Result by Edi, Radresults In (06/01 1612)   Current study is negative for active disease.      Port-A-Cath is satisfactory.      This note was partially created using voice recognition software and is   inherently subject to errors including those of syntax and "sound alike "   substitutions which may escape proof reading.  In such instances, original   meaning may be extrapolated by contextual derivation.         Radiologist location ID: [id number]         XR AP MOBILE  CHEST    (Results Pending)       DISCHARGE INSTRUCTIONS:  Follow-up Information     Family Medicine, [facility] .    Specialty: Family Medicine  Contact information:  [place] SSN-[id number]  [phone]                  DISCHARGE INSTRUCTION - DIET     Diet: RESUME HOME DIET      DISCHARGE INSTRUCTION - ACTIVITY     Activity: AS TOLERATED      DISCHARGE INSTRUCTION - [place]    Resume home medications.  Follow-up with your cardiologist, Dr. Tharon Aquas.  Return to the ER if you have chest pain.     FOLLOW-UP: [place], [place]     Follow-up in: [place]    Reason for visit: HOSPITAL DISCHARGE           CONDITION ON DISCHARGE: Alert, Oriented and VS Stable    DISCHARGE DISPOSITION:  Home discharge       Bethanie Dicker, MD

## 2020-05-13 NOTE — Discharge Instructions (Signed)
Take medications as ordered.  Keep follow up appointments  Return to ER if needed

## 2020-05-13 NOTE — Care Plan (Signed)
Problem: Adult Inpatient Plan of Care  Goal: Plan of Care Review  Outcome: Adequate for Discharge  Goal: Patient-Specific Goal (Individualized)  Outcome: Adequate for Discharge  Goal: Absence of Hospital-Acquired Illness or Injury  Outcome: Adequate for Discharge  Goal: Optimal Comfort and Wellbeing  Outcome: Adequate for Discharge  Goal: Rounds/Family Conference  Outcome: Adequate for Discharge     Problem: Fall Injury Risk  Goal: Absence of Fall and Fall-Related Injury  Outcome: Adequate for Discharge     Problem: Skin Injury Risk Increased  Goal: Skin Health and Integrity  Outcome: Adequate for Discharge     Problem: Chest Pain  Goal: Resolution of Chest Pain Symptoms  Outcome: Adequate for Discharge     Problem: Asthma Comorbidity  Goal: Maintenance of Asthma Control  Outcome: Adequate for Discharge     Problem: COPD Comorbidity  Goal: Maintenance of COPD Symptom Control  Outcome: Adequate for Discharge     Problem: Diabetes Comorbidity  Goal: Blood Glucose Level Within Targeted Range  Outcome: Adequate for Discharge     Problem: Hypertension Comorbidity  Goal: Blood Pressure in Desired Range  Outcome: Adequate for Discharge   Pt discharged home new RX for NTG

## 2020-05-13 NOTE — Nurses Notes (Signed)
Discharge instructions received and given to pt.  Mediport flushed per protocol and de accessed.  Personal belongings with pt.  Son notified of discharge.

## 2020-05-13 NOTE — Care Management Notes (Signed)
05/13/20 1612   Assessment Details   Assessment Type Admission   Date of Care Management Update 05/13/20   Readmission   Is this a readmission? No   Care Management Plan   Discharge Planning Status initial meeting   Projected Discharge Date 05/13/20   Discharge plan discussed with: Patient   CM will evaluate for rehabilitation potential no   Facility or Agency Preferences Has [facility]   Discharge Needs Assessment   Outpatient/Agency/Support Group Needs homecare agency   Was referral sent to APS? No   Equipment Currently Used at [employer], Network engineer Needed After Discharge none   Discharge Facility/Level of Care Needs Home with Home Health (code 6)   Transportation Available car;family or friend will provide   Referral Information   Admission Type observation   Address Verified verified-no changes   Arrived From home healthcare service   [place] Verified verified-no change   Observation Form   Observation Form Given MOON form given   MOON form explained/reviewed with:  Patient   MOON form date of delivery 05/12/20   MOON form time of delivery 1617   ADVANCE DIRECTIVES   Does the Patient have an Advance Directive? Yes, Patient Does Have Advance Directive for Healthcare Treatment   Type of Advance Directive Completed Medical Power of Attorney;Medical Living Will   Copy of Advance Directives in Chart? 0   Name of [place] and [place]   Phone Number of [place] or Healthcare Surrogate [phone] and [phone]   Patient Requests Assistance in Having Advance Directive Notarized. N/A   LAY CAREGIVER    Appointed Lay Caregiver? I Decline   Employment/Financial   Patient has Prescription Coverage?  Yes        Name of Insurance Coverage for Medications [employer]   Financial Concerns none   Living Environment   Select an age group to open "lives with" row.  Adult   Lives With child(ren), adult   Living Arrangements house    Able to Return to Prior Arrangements yes   Home Safety   Home Assessment: No Problems Identified   Home Accessibility no concerns;bed and bath on same level;stairs to enter home   Legal Issues   Do you have a court appointed guardian/conservator? No   Patient Hand-Off   Clinical/Discharge Plan of Care Information Communicated to:  Clinical Care Coordinator   Home Main Entrance   Number of Stairs, Main Entrance six

## 2020-05-13 NOTE — Nurses Notes (Signed)
Npo for possible stress. 98% on RA.

## 2020-05-13 NOTE — Respiratory Therapy (Signed)
No changes in respiratory status this shift.

## 2020-05-14 ENCOUNTER — Telehealth (HOSPITAL_COMMUNITY): Payer: Self-pay

## 2020-05-14 NOTE — Nursing Note (Signed)
(  dot)Transition of Care Contact  Discharge Date: 05/13/2020  Transition Facility Type--Hospital (Inpatient or Observation)  [facility]  Interactive Contact(s): Completed or attempted contact indicated by Date/Time  Completed Contact  First Attempt--05/14/2020  2:26 PM--No answer  Second Attempt  Third Attempt  Contact Method(s)-- Patient/Caregiver Telephone  Clinical Staff Name/Role who contacted  Transition Note:      Further information may be documented in relevant telephone or outreach encounter.

## 2020-05-15 ENCOUNTER — Other Ambulatory Visit (INDEPENDENT_AMBULATORY_CARE_PROVIDER_SITE_OTHER): Payer: Self-pay | Admitting: FAMILY PRACTICE

## 2020-05-15 ENCOUNTER — Telehealth (HOSPITAL_COMMUNITY): Payer: Self-pay

## 2020-05-15 ENCOUNTER — Telehealth (INDEPENDENT_AMBULATORY_CARE_PROVIDER_SITE_OTHER): Payer: Self-pay | Admitting: FAMILY PRACTICE

## 2020-05-15 ENCOUNTER — Encounter (INDEPENDENT_AMBULATORY_CARE_PROVIDER_SITE_OTHER): Payer: Self-pay | Admitting: FAMILY PRACTICE

## 2020-05-15 DIAGNOSIS — I509 Heart failure, unspecified: Secondary | ICD-10-CM

## 2020-05-15 DIAGNOSIS — E119 Type 2 diabetes mellitus without complications: Secondary | ICD-10-CM

## 2020-05-15 DIAGNOSIS — I10 Essential (primary) hypertension: Secondary | ICD-10-CM

## 2020-05-15 MED ORDER — OXYCODONE-ACETAMINOPHEN 5 MG-325 MG TABLET
1.00 | ORAL_TABLET | Freq: Four times a day (QID) | ORAL | 0 refills | Status: DC | PRN
Start: 2020-05-15 — End: 2020-06-04

## 2020-05-15 MED ORDER — HYDRALAZINE 25 MG TABLET
25.00 mg | ORAL_TABLET | Freq: Two times a day (BID) | ORAL | 5 refills | Status: DC
Start: 2020-05-15 — End: 2020-11-18

## 2020-05-15 NOTE — Telephone Encounter (Signed)
Patients grand daughter, Stanton Kidney notified and understood of Dr Karmen Bongo messages.    She is to call with further messages    Lucretia Field, RN

## 2020-05-15 NOTE — Telephone Encounter (Signed)
Yes I would keep her off the Magnesium oxide for now. Hydralazine order sent in to the pharmacy at [facility] along with a refill of the oxycodone as well.  Taking the 1/2 tab during the day of the pain medication is OK.  I agree that she is a survivor and a tough person.  drmark

## 2020-05-15 NOTE — Telephone Encounter (Signed)
-----   Message from Lucretia Field, RN sent at 05/15/2020 11:06 AM EDT -----  Regarding: FW: Prescription Question  Contact: [phone]    ----- Message -----  From: [name]  Sent: 05/15/2020  10:16 AM EDT  To: , #  Subject: Prescription Question                            Good morning Dr [name],    This morning I sat down and was working on compiling 2 weeks worth of medication for Gma. I was using the new medication list which had noted that the only medication change was the addition of the nitro but as I worked my way through the list I saw that it no longer included the mag-oxide 400mg  twice/daily. I wanted to check with you and make sure this was correct. Also, she is getting low on her hydralazine that is ordered for 25 mg 2xd and we were given 50 mg tabs from the pharmacy and asked to split, these particular tabs do not split well at all and I was hoping that perhaps we could get an updated prescription for actual 25mg  tablets. I am also sending a request for renewal of her oxycodone and wanted to let you know that during the daytime we are giving her 1/2 tablet every 3 hours, as if we give her a whole tablet at one time she will sleep all day. This seems to alleviate her pain well and she can still function and enjoy some of the day. At night we give her a whole because of course that just makes sense and on occasion if she still feels bad (stomach or head always) we will go ahead and give her a whole on top of the 1/2 to help her find relief, this doesn't happen often, but it does happen.  All in all she came home from the hospital doing very well. I came home from school yesterday to find her walking around and wanting to get her hair cut and take a shower. This lady is amazing in that when you think one thing is going to get her down she seems to bounce back stronger than before.   I apologize for the long message, I just felt I should ask concerning these things and update you. If you have any  questions or concerns please feel free to call or message me.  Thank you for your time- God bless  Ciearra Rufo   [phone] cell

## 2020-05-15 NOTE — Nursing Note (Signed)
(  dot)Transition of Care Contact  Discharge Date: 05/13/2020  Transition Facility Type--Hospital (Inpatient or Observation)  [facility]  Interactive Contact(s): Completed or attempted contact indicated by Date/Time  Completed Contact--05/15/2020 10:53 AM  First Attempt--05/14/2020  2:26 PM  Second Attempt  Third Attempt  Contact Method(s)-- Patient/Caregiver Telephone  Clinical Staff Name/Role who [name], LPN  Transition Assessment  Discharge Summary obtained?--Yes  How are you recovering?--Improving  Discharge Meds obtained?--Yes  Medications reconcilled with Discharge Documentation?--Yes  Medication understanding --knows new medication(s)--knows purpose of medication  Medication Concerns?--No  Have everything needed for recovery?--Yes  Care Coordination:   Patient has transition follow-up appointment date and time?  Primary Care Transition Visit planned?--Yes  Specialist Transition Visit planned?--Yes  Patient/caregiver plans to attend transition visit?--Yes  Primary Follow-up Barrier  Interventions provided --reinforced discharge instructions--follow-up appointment date/time reinforced  Home Health or DME ordered at discharge?  Clinician/Team notified?  Primary reason clinician notified?  Transition Note:   Patient states she has some abdominal discomfort, otherwise doing ok.  Denies chest pain or shortness of breath.  Home meds taken as directed.  Follows up with Cardiology on 05/25/2020 at 0830.     Further information may be documented in relevant telephone or outreach encounter.

## 2020-05-27 ENCOUNTER — Telehealth (INDEPENDENT_AMBULATORY_CARE_PROVIDER_SITE_OTHER): Payer: Self-pay | Admitting: FAMILY PRACTICE

## 2020-05-27 NOTE — Nursing Note (Signed)
Dr. Dorthula Matas with [facility] called and want to know if they can have an order for Social Worker to come in please.  Charolotte Capuchin, RN

## 2020-05-29 ENCOUNTER — Other Ambulatory Visit (INDEPENDENT_AMBULATORY_CARE_PROVIDER_SITE_OTHER): Payer: Self-pay | Admitting: FAMILY PRACTICE

## 2020-05-29 ENCOUNTER — Telehealth (INDEPENDENT_AMBULATORY_CARE_PROVIDER_SITE_OTHER): Payer: Self-pay | Admitting: FAMILY MEDICINE

## 2020-05-29 ENCOUNTER — Telehealth (INDEPENDENT_AMBULATORY_CARE_PROVIDER_SITE_OTHER): Payer: Self-pay | Admitting: FAMILY PRACTICE

## 2020-05-29 DIAGNOSIS — Z029 Encounter for administrative examinations, unspecified: Secondary | ICD-10-CM

## 2020-05-29 NOTE — Telephone Encounter (Signed)
Dr. Elta Guadeloupe: [name] called from [facility] to report that she did physical therapy with patient today and patient did well. She is going to extend patient's physical therapy 6 weeks as she believes this will benefit the patient. She wanted to let you know. Thanks. Jettie Pagan, RN

## 2020-05-29 NOTE — Progress Notes (Signed)
This visit was clicked in error. Telemedicine visit not done. Please disregard. Jettie Pagan, RN

## 2020-06-01 ENCOUNTER — Telehealth (INDEPENDENT_AMBULATORY_CARE_PROVIDER_SITE_OTHER): Payer: Self-pay | Admitting: FAMILY PRACTICE

## 2020-06-01 NOTE — Telephone Encounter (Signed)
Social worker, Lattie Haw with [facility] called with updates    She made a home visit and found the patient doing well, up and walking about and doing fine.    The patient then reported that her "belly" was hurting a little.  Michela Pitcher it was a 67.  They discussed ED visit with this pain.  Then the pain went to a 5 and she said the ED was not needed.    Patient walked about and then had a BM and felt better.    This episode was discussed with family and they said she seems to like this attention she gets when she has pain    When Patient does take pain medicine she is encouraged to assist her diet with water, fiber foods etc to help prevent constipation with these types of medicines.    Patient applied for financial assistance for home care and when this is approved her family will be able to care for her at home.    This is FYI for Dr Karmen Bongo and any further new orders, please fax them to [facility].    Lucretia Field, RN

## 2020-06-04 ENCOUNTER — Telehealth (INDEPENDENT_AMBULATORY_CARE_PROVIDER_SITE_OTHER): Payer: Self-pay | Admitting: FAMILY PRACTICE

## 2020-06-04 ENCOUNTER — Encounter (INDEPENDENT_AMBULATORY_CARE_PROVIDER_SITE_OTHER): Payer: Self-pay | Admitting: FAMILY PRACTICE

## 2020-06-04 MED ORDER — OXYCODONE-ACETAMINOPHEN 7.5 MG-325 MG TABLET
1.00 | ORAL_TABLET | Freq: Four times a day (QID) | ORAL | 0 refills | Status: DC | PRN
Start: 2020-06-04 — End: 2020-07-03

## 2020-06-04 NOTE — Telephone Encounter (Signed)
-----   Message from Lucretia Field, RN sent at 06/04/2020  4:40 PM EDT -----  Regarding: FW: Prescription Question  Contact: [phone]    ----- Message -----  From: [name]  Sent: 06/04/2020  11:51 AM EDT  To: , #  Subject: Prescription Question                            Good morning Dr [name],    I wanted to touch base with you about Gma. She has been taking her pain medication around the clock and sometimes I have administered it to her an hour or so ahead of time because of persistent stomach pain. She is still eating very well, has good mobility, and even participates in family activities but she seems to have either built a tolerance to her current medication or her stomach issues have worsened to the point that she needs something stronger.     We have an appointment scheduled with you on the 29th but I am concerned and just felt I should touch base with you sooner.  I know you have encouraged [place] to seek the assistance of hospice but when we speak with her about it she states she does not want it, her depression seems much worse, and honestly all in all I feel we are handling her situation quite well considering all of her health problems.  I have had hospice training and with her current state of health, mentality, and drive I feel it would do little more than discourage her, worsening her depression, at this point.    I had spoke with my husband about the situation last night, as it seems nighttime is when she has the most persistent pain (though it does happen in the day too- just not as frequently as long as we stay consistent) and honestly, so we can all get a good nights rest, I give her 1.5 tablets just before bedtime and it seems to get her through the night without problems.    Please share your thoughts with me concerning this. We really appreciate all you do for her and value your thoughts.    God Mckenzee, [place]  [phone]

## 2020-06-04 NOTE — Telephone Encounter (Signed)
Call and tell the [name] that I would suggest that rather than give her 1.5 tablets of the percocet 5-325 tables that we should increase her to 7.5-325mg  tabs to avoid the extra tylenol.  I will send this Rx in to the pharmacy at [facility] in [place]

## 2020-06-05 NOTE — Telephone Encounter (Signed)
I called and spoke with patient    I explained Dr Karmen Bongo orders regarding her increased pain.    Patient sounded new to this report of taking 1.5 pills at night.  And she kept saying that someone else helps her with her medicines.    I explained that her care taker could call with questions    Lucretia Field, RN

## 2020-06-09 ENCOUNTER — Emergency Department (HOSPITAL_COMMUNITY): Payer: Medicare Other

## 2020-06-09 ENCOUNTER — Telehealth (INDEPENDENT_AMBULATORY_CARE_PROVIDER_SITE_OTHER): Payer: Self-pay | Admitting: FAMILY PRACTICE

## 2020-06-09 ENCOUNTER — Encounter (HOSPITAL_COMMUNITY): Payer: Self-pay

## 2020-06-09 ENCOUNTER — Inpatient Hospital Stay
Admission: EM | Admit: 2020-06-09 | Discharge: 2020-06-14 | DRG: 690 | Disposition: A | Discharge status: Home Health | Payer: Medicare Other | Attending: Family Medicine | Admitting: Family Medicine

## 2020-06-09 ENCOUNTER — Inpatient Hospital Stay (HOSPITAL_COMMUNITY): Payer: Medicare Other | Admitting: Family Medicine

## 2020-06-09 ENCOUNTER — Other Ambulatory Visit: Payer: Self-pay

## 2020-06-09 ENCOUNTER — Ambulatory Visit (INDEPENDENT_AMBULATORY_CARE_PROVIDER_SITE_OTHER): Payer: Medicare Other | Admitting: FAMILY PRACTICE

## 2020-06-09 DIAGNOSIS — Z87891 Personal history of nicotine dependence: Secondary | ICD-10-CM | POA: Insufficient documentation

## 2020-06-09 DIAGNOSIS — Z794 Long term (current) use of insulin: Secondary | ICD-10-CM | POA: Diagnosis present

## 2020-06-09 DIAGNOSIS — F325 Major depressive disorder, single episode, in full remission: Secondary | ICD-10-CM

## 2020-06-09 DIAGNOSIS — Z20822 Contact with and (suspected) exposure to covid-19: Secondary | ICD-10-CM | POA: Insufficient documentation

## 2020-06-09 DIAGNOSIS — E785 Hyperlipidemia, unspecified: Secondary | ICD-10-CM | POA: Diagnosis present

## 2020-06-09 DIAGNOSIS — I2699 Other pulmonary embolism without acute cor pulmonale: Secondary | ICD-10-CM

## 2020-06-09 DIAGNOSIS — E119 Type 2 diabetes mellitus without complications: Secondary | ICD-10-CM | POA: Insufficient documentation

## 2020-06-09 DIAGNOSIS — C250 Malignant neoplasm of head of pancreas: Secondary | ICD-10-CM

## 2020-06-09 DIAGNOSIS — R4182 Altered mental status, unspecified: Secondary | ICD-10-CM

## 2020-06-09 DIAGNOSIS — E871 Hypo-osmolality and hyponatremia: Secondary | ICD-10-CM

## 2020-06-09 DIAGNOSIS — E876 Hypokalemia: Secondary | ICD-10-CM

## 2020-06-09 DIAGNOSIS — R188 Other ascites: Secondary | ICD-10-CM | POA: Diagnosis present

## 2020-06-09 DIAGNOSIS — T68XXXA Hypothermia, initial encounter: Secondary | ICD-10-CM

## 2020-06-09 DIAGNOSIS — F419 Anxiety disorder, unspecified: Secondary | ICD-10-CM | POA: Insufficient documentation

## 2020-06-09 DIAGNOSIS — E222 Syndrome of inappropriate secretion of antidiuretic hormone: Secondary | ICD-10-CM

## 2020-06-09 DIAGNOSIS — Z515 Encounter for palliative care: Secondary | ICD-10-CM

## 2020-06-09 DIAGNOSIS — D709 Neutropenia, unspecified: Secondary | ICD-10-CM

## 2020-06-09 DIAGNOSIS — J45909 Unspecified asthma, uncomplicated: Secondary | ICD-10-CM

## 2020-06-09 DIAGNOSIS — R601 Generalized edema: Secondary | ICD-10-CM

## 2020-06-09 DIAGNOSIS — I4811 Longstanding persistent atrial fibrillation: Secondary | ICD-10-CM | POA: Diagnosis present

## 2020-06-09 DIAGNOSIS — I110 Hypertensive heart disease with heart failure: Secondary | ICD-10-CM | POA: Insufficient documentation

## 2020-06-09 DIAGNOSIS — J449 Chronic obstructive pulmonary disease, unspecified: Secondary | ICD-10-CM | POA: Diagnosis present

## 2020-06-09 DIAGNOSIS — I509 Heart failure, unspecified: Secondary | ICD-10-CM | POA: Diagnosis present

## 2020-06-09 DIAGNOSIS — R110 Nausea: Secondary | ICD-10-CM | POA: Diagnosis not present

## 2020-06-09 DIAGNOSIS — G6289 Other specified polyneuropathies: Secondary | ICD-10-CM

## 2020-06-09 DIAGNOSIS — C259 Malignant neoplasm of pancreas, unspecified: Secondary | ICD-10-CM | POA: Diagnosis present

## 2020-06-09 DIAGNOSIS — Z90411 Acquired partial absence of pancreas: Secondary | ICD-10-CM

## 2020-06-09 DIAGNOSIS — K219 Gastro-esophageal reflux disease without esophagitis: Secondary | ICD-10-CM | POA: Insufficient documentation

## 2020-06-09 DIAGNOSIS — R918 Other nonspecific abnormal finding of lung field: Secondary | ICD-10-CM

## 2020-06-09 DIAGNOSIS — I5033 Acute on chronic diastolic (congestive) heart failure: Secondary | ICD-10-CM

## 2020-06-09 DIAGNOSIS — I214 Non-ST elevation (NSTEMI) myocardial infarction: Secondary | ICD-10-CM

## 2020-06-09 DIAGNOSIS — Z86711 Personal history of pulmonary embolism: Secondary | ICD-10-CM

## 2020-06-09 DIAGNOSIS — E1142 Type 2 diabetes mellitus with diabetic polyneuropathy: Secondary | ICD-10-CM | POA: Diagnosis present

## 2020-06-09 DIAGNOSIS — C7802 Secondary malignant neoplasm of left lung: Secondary | ICD-10-CM

## 2020-06-09 DIAGNOSIS — R739 Hyperglycemia, unspecified: Secondary | ICD-10-CM

## 2020-06-09 DIAGNOSIS — R519 Headache, unspecified: Secondary | ICD-10-CM

## 2020-06-09 DIAGNOSIS — Z8507 Personal history of malignant neoplasm of pancreas: Secondary | ICD-10-CM

## 2020-06-09 DIAGNOSIS — I10 Essential (primary) hypertension: Secondary | ICD-10-CM | POA: Diagnosis present

## 2020-06-09 DIAGNOSIS — Z79899 Other long term (current) drug therapy: Secondary | ICD-10-CM | POA: Insufficient documentation

## 2020-06-09 DIAGNOSIS — N390 Urinary tract infection, site not specified: Secondary | ICD-10-CM | POA: Diagnosis present

## 2020-06-09 DIAGNOSIS — E440 Moderate protein-calorie malnutrition: Secondary | ICD-10-CM

## 2020-06-09 DIAGNOSIS — Z7982 Long term (current) use of aspirin: Secondary | ICD-10-CM | POA: Insufficient documentation

## 2020-06-09 DIAGNOSIS — R079 Chest pain, unspecified: Secondary | ICD-10-CM

## 2020-06-09 DIAGNOSIS — A419 Sepsis, unspecified organism: Secondary | ICD-10-CM

## 2020-06-09 DIAGNOSIS — Z66 Do not resuscitate: Secondary | ICD-10-CM

## 2020-06-09 DIAGNOSIS — K7469 Other cirrhosis of liver: Secondary | ICD-10-CM | POA: Insufficient documentation

## 2020-06-09 DIAGNOSIS — K7460 Unspecified cirrhosis of liver: Secondary | ICD-10-CM | POA: Diagnosis present

## 2020-06-09 LAB — URINALYSIS, MICROSCOPIC

## 2020-06-09 LAB — POC BLOOD GLUCOSE (RESULTS)
GLUCOSE, POC: 91 mg/dl (ref 60–110)
GLUCOSE, POC: 228 mg/dl — ABNORMAL HIGH (ref 60–110)

## 2020-06-09 LAB — DRUG SCREEN, NO CONFIRMATION, URINE
AMPHETAMINES, URINE: NEGATIVE
BARBITURATES URINE: NEGATIVE
BENZODIAZEPINES URINE: NEGATIVE
BUPRENORPHINE URINE: NEGATIVE
CANNABINOIDS URINE: NEGATIVE
COCAINE METABOLITES URINE: NEGATIVE
CREATININE RANDOM URINE: 59 mg/dL (ref 50–100)
ECSTASY/MDMA URINE: NEGATIVE
FENTANYL, RANDOM URINE: NEGATIVE
METHADONE URINE: NEGATIVE
OPIATES URINE (LOW CUTOFF): NEGATIVE
OXYCODONE URINE: POSITIVE — AB

## 2020-06-09 LAB — CBC WITH DIFF
BASOPHIL #: <0.1 10*3/uL (ref <=0.20)
BASOPHIL %: 0 %
EOSINOPHIL #: <0.1 10*3/uL (ref <=0.50)
EOSINOPHIL %: 0 %
HCT: 31.5 % — ABNORMAL LOW (ref 34.8–46.0)
HGB: 10.3 g/dL — ABNORMAL LOW (ref 11.5–16.0)
IMMATURE GRANULOCYTE #: <0.1 10*3/uL (ref <0.10)
IMMATURE GRANULOCYTE %: 1 % (ref 0–1)
LYMPHOCYTE #: 0.42 10*3/uL — ABNORMAL LOW (ref 1.00–4.80)
LYMPHOCYTE %: 3 %
MCH: 28 pg (ref 26.0–32.0)
MCHC: 32.7 g/dL (ref 31.0–35.5)
MCV: 85.6 fL (ref 78.0–100.0)
MONOCYTE #: 0.67 10*3/uL (ref 0.20–1.10)
MONOCYTE %: 5 %
MPV: 11.5 fL (ref 8.7–12.5)
NEUTROPHIL #: 13.24 10*3/uL — ABNORMAL HIGH (ref 1.50–7.70)
NEUTROPHIL %: 91 %
PLATELETS: 76 10*3/uL — ABNORMAL LOW (ref 150–400)
RBC: 3.68 10*6/uL — ABNORMAL LOW (ref 3.85–5.22)
RDW-CV: 13.9 % (ref 11.5–15.5)
WBC: 14.5 10*3/uL — ABNORMAL HIGH (ref 3.7–11.0)

## 2020-06-09 LAB — COMPREHENSIVE METABOLIC PANEL, NON-FASTING
ALBUMIN: 2.8 g/dL — ABNORMAL LOW (ref 3.4–4.8)
ALKALINE PHOSPHATASE: 440 U/L — ABNORMAL HIGH (ref 55–145)
ALT (SGPT): 332 U/L — ABNORMAL HIGH (ref 8–22)
ANION GAP: 8 mmol/L (ref 4–13)
AST (SGOT): 703 U/L — ABNORMAL HIGH (ref 8–45)
BILIRUBIN TOTAL: 0.6 mg/dL (ref 0.3–1.3)
BUN/CREA RATIO: 24 — ABNORMAL HIGH (ref 6–22)
BUN: 25 mg/dL (ref 8–25)
CALCIUM: 8.2 mg/dL — ABNORMAL LOW (ref 8.8–10.2)
CHLORIDE: 95 mmol/L — ABNORMAL LOW (ref 96–111)
CO2 TOTAL: 27 mmol/L (ref 23–31)
CREATININE: 1.05 mg/dL (ref 0.60–1.05)
ESTIMATED GFR: 53 mL/min/BSA — ABNORMAL LOW (ref >=60)
GLUCOSE: 60 mg/dL — ABNORMAL LOW (ref 65–125)
POTASSIUM: 3.7 mmol/L (ref 3.5–5.1)
PROTEIN TOTAL: 4.9 g/dL — ABNORMAL LOW (ref 6.0–8.0)
SODIUM: 130 mmol/L — ABNORMAL LOW (ref 136–145)

## 2020-06-09 LAB — URINALYSIS, MACROSCOPIC
BILIRUBIN: NEGATIVE mg/dL
GLUCOSE: NEGATIVE mg/dL
KETONES: NEGATIVE mg/dL
LEUKOCYTES: NEGATIVE WBCs/uL
NITRITE: NEGATIVE
PH: 6.5 (ref 5.0–8.5)
PROTEIN: >=300 mg/dL — AB
SPECIFIC GRAVITY: 1.02 (ref 1.005–1.030)
UROBILINOGEN: 0.2 mg/dL

## 2020-06-09 LAB — TROPONIN-I
TROPONIN I: 162 ng/L (ref 7–30)
TROPONIN I: 180 ng/L (ref 7–30)
TROPONIN I: 141 ng/L — ABNORMAL HIGH (ref 7–30)

## 2020-06-09 LAB — LACTIC ACID LEVEL W/ REFLEX FOR LEVEL >2.0: LACTIC ACID: 1 mmol/L (ref 0.5–2.2)

## 2020-06-09 LAB — ACETAMINOPHEN LEVEL: ACETAMINOPHEN LEVEL: <5 ug/mL — ABNORMAL LOW (ref 10–30)

## 2020-06-09 LAB — BLUE TOP TUBE

## 2020-06-09 LAB — COVID-19 ~~LOC~~ MOLECULAR LAB TESTING: SARS-CoV-2: NOT DETECTED

## 2020-06-09 LAB — B-TYPE NATRIURETIC PEPTIDE: BNP: 952 pg/mL — ABNORMAL HIGH (ref <=99)

## 2020-06-09 MED ORDER — DEXTROSE 50 % IN WATER (D50W) INTRAVENOUS SYRINGE
12.50 g | INJECTION | INTRAVENOUS | Status: DC | PRN
Start: 2020-06-09 — End: 2020-06-14

## 2020-06-09 MED ORDER — OXYCODONE-ACETAMINOPHEN 7.5 MG-325 MG TABLET
1.00 | ORAL_TABLET | Freq: Four times a day (QID) | ORAL | Status: DC | PRN
Start: 2020-06-09 — End: 2020-06-09

## 2020-06-09 MED ORDER — FUROSEMIDE 40 MG TABLET
120.00 mg | ORAL_TABLET | Freq: Every day | ORAL | Status: DC
Start: 2020-06-10 — End: 2020-06-14
  Administered 2020-06-10 – 2020-06-14 (×5): 120 mg via ORAL
  Filled 2020-06-09 (×5): qty 3

## 2020-06-09 MED ORDER — NITROGLYCERIN 0.4 MG SUBLINGUAL TABLET
0.40 mg | SUBLINGUAL_TABLET | SUBLINGUAL | Status: DC | PRN
Start: 2020-06-09 — End: 2020-06-14

## 2020-06-09 MED ORDER — SODIUM CHLORIDE 0.9 % (FLUSH) INJECTION SYRINGE
10.00 mL | INJECTION | Freq: Three times a day (TID) | INTRAMUSCULAR | Status: DC
Start: 2020-06-09 — End: 2020-06-14
  Administered 2020-06-09 – 2020-06-11 (×5): 0 mL
  Administered 2020-06-11: 10 mL
  Administered 2020-06-11 – 2020-06-12 (×3): 0 mL
  Administered 2020-06-12: 10 mL
  Administered 2020-06-13 – 2020-06-14 (×4): 0 mL

## 2020-06-09 MED ORDER — ASPIRIN 81 MG TABLET,DELAYED RELEASE
81.00 mg | DELAYED_RELEASE_TABLET | Freq: Every day | ORAL | Status: DC
Start: 2020-06-10 — End: 2020-06-14
  Administered 2020-06-10 – 2020-06-14 (×5): 81 mg via ORAL
  Filled 2020-06-09 (×5): qty 1

## 2020-06-09 MED ORDER — ACETAMINOPHEN 325 MG TABLET
650.00 mg | ORAL_TABLET | ORAL | Status: DC | PRN
Start: 2020-06-09 — End: 2020-06-14
  Administered 2020-06-11 (×2): 650 mg via ORAL
  Filled 2020-06-09 (×2): qty 2

## 2020-06-09 MED ORDER — SODIUM CHLORIDE 0.9 % IV BOLUS
1000.00 mL | INJECTION | Status: AC
Start: 2020-06-09 — End: 2020-06-09
  Administered 2020-06-09: 1000 mL via INTRAVENOUS
  Administered 2020-06-09: 0 mL via INTRAVENOUS

## 2020-06-09 MED ORDER — OXYCODONE 5 MG TABLET
10.00 mg | ORAL_TABLET | ORAL | Status: DC | PRN
Start: 2020-06-09 — End: 2020-06-14
  Administered 2020-06-09 – 2020-06-12 (×3): 10 mg via ORAL
  Filled 2020-06-09 (×3): qty 2

## 2020-06-09 MED ORDER — ACETAMINOPHEN 325 MG TABLET
325.00 mg | ORAL_TABLET | ORAL | Status: DC | PRN
Start: 2020-06-09 — End: 2020-06-14
  Administered 2020-06-10: 325 mg via ORAL
  Filled 2020-06-09: qty 1

## 2020-06-09 MED ORDER — ENOXAPARIN 30 MG/0.3 ML SUBCUTANEOUS SYRINGE
30.00 mg | INJECTION | SUBCUTANEOUS | Status: DC
Start: 2020-06-09 — End: 2020-06-14
  Administered 2020-06-09 – 2020-06-13 (×5): 30 mg via SUBCUTANEOUS
  Filled 2020-06-09 (×5): qty 0.3

## 2020-06-09 MED ORDER — INSULIN GLARGINE 100 UNITS/ML SUBQ - CHARGE BY DOSE
10.00 [IU] | Freq: Every evening | SUBCUTANEOUS | Status: DC
Start: 2020-06-09 — End: 2020-06-14
  Administered 2020-06-09 – 2020-06-13 (×5): 10 [IU] via SUBCUTANEOUS
  Filled 2020-06-09 (×5): qty 10

## 2020-06-09 MED ORDER — HYDRALAZINE 25 MG TABLET
25.00 mg | ORAL_TABLET | Freq: Two times a day (BID) | ORAL | Status: DC
Start: 2020-06-09 — End: 2020-06-14
  Administered 2020-06-09 – 2020-06-14 (×10): 25 mg via ORAL
  Filled 2020-06-09 (×10): qty 1

## 2020-06-09 MED ORDER — MAGNESIUM HYDROXIDE 400 MG/5 ML ORAL SUSPENSION
15.00 mL | Freq: Every day | ORAL | Status: DC | PRN
Start: 2020-06-09 — End: 2020-06-14

## 2020-06-09 MED ORDER — METOPROLOL SUCCINATE ER 50 MG TABLET,EXTENDED RELEASE 24 HR
50.00 mg | ORAL_TABLET | Freq: Every day | ORAL | Status: DC
Start: 2020-06-10 — End: 2020-06-14
  Administered 2020-06-10 – 2020-06-14 (×5): 50 mg via ORAL
  Filled 2020-06-09 (×5): qty 1

## 2020-06-09 MED ORDER — DILTIAZEM 30 MG TABLET
60.00 mg | ORAL_TABLET | Freq: Two times a day (BID) | ORAL | Status: DC
Start: 2020-06-09 — End: 2020-06-14
  Administered 2020-06-09 – 2020-06-14 (×10): 60 mg via ORAL
  Filled 2020-06-09 (×10): qty 2

## 2020-06-09 MED ORDER — LINEZOLID IN 5% DEXTROSE IN WATER 600 MG/300 ML INTRAVENOUS PIGGYBACK
600.0000 mg | INJECTION | Freq: Two times a day (BID) | INTRAVENOUS | Status: DC
Start: 2020-06-10 — End: 2020-06-14
  Administered 2020-06-09 – 2020-06-10 (×2): 600 mg via INTRAVENOUS
  Administered 2020-06-10: 0 mg via INTRAVENOUS
  Administered 2020-06-10: 600 mg via INTRAVENOUS
  Administered 2020-06-10 – 2020-06-11 (×2): 0 mg via INTRAVENOUS
  Administered 2020-06-11: 600 mg via INTRAVENOUS
  Administered 2020-06-11 – 2020-06-12 (×2): 0 mg via INTRAVENOUS
  Administered 2020-06-12: 600 mg via INTRAVENOUS
  Administered 2020-06-12: 0 mg via INTRAVENOUS
  Administered 2020-06-12: 600 mg via INTRAVENOUS
  Administered 2020-06-13 (×2): 0 mg via INTRAVENOUS
  Administered 2020-06-13 (×3): 600 mg via INTRAVENOUS
  Administered 2020-06-14: 0 mg via INTRAVENOUS
  Filled 2020-06-09 (×9): qty 300

## 2020-06-09 MED ORDER — LORAZEPAM 0.5 MG TABLET
0.50 mg | ORAL_TABLET | Freq: Every evening | ORAL | Status: DC
Start: 2020-06-09 — End: 2020-06-14
  Administered 2020-06-09 – 2020-06-13 (×5): 0.5 mg via ORAL
  Filled 2020-06-09 (×5): qty 1

## 2020-06-09 MED ORDER — LOSARTAN 50 MG TABLET
100.00 mg | ORAL_TABLET | Freq: Every day | ORAL | Status: DC
Start: 2020-06-10 — End: 2020-06-14
  Administered 2020-06-10 – 2020-06-14 (×5): 100 mg via ORAL
  Filled 2020-06-09 (×5): qty 2

## 2020-06-09 MED ORDER — ONDANSETRON HCL (PF) 4 MG/2 ML INJECTION SOLUTION
4.00 mg | Freq: Four times a day (QID) | INTRAMUSCULAR | Status: DC | PRN
Start: 2020-06-09 — End: 2020-06-14
  Administered 2020-06-12: 4 mg via INTRAVENOUS
  Filled 2020-06-09: qty 2

## 2020-06-09 MED ORDER — SODIUM CHLORIDE 0.9 % INTRAVENOUS SOLUTION
INTRAVENOUS | Status: DC
Start: 2020-06-09 — End: 2020-06-14

## 2020-06-09 MED ORDER — ATORVASTATIN 10 MG TABLET
10.00 mg | ORAL_TABLET | Freq: Every evening | ORAL | Status: DC
Start: 2020-06-09 — End: 2020-06-14
  Administered 2020-06-09 – 2020-06-13 (×5): 10 mg via ORAL
  Filled 2020-06-09 (×5): qty 1

## 2020-06-09 MED ORDER — SODIUM CHLORIDE 0.9 % INTRAVENOUS PIGGYBACK
1.0000 g | INTRAVENOUS | Status: DC
Start: 2020-06-10 — End: 2020-06-14
  Administered 2020-06-10: 0 g via INTRAVENOUS
  Administered 2020-06-10 – 2020-06-11 (×2): 1 g via INTRAVENOUS
  Administered 2020-06-11 – 2020-06-12 (×2): 0 g via INTRAVENOUS
  Administered 2020-06-12: 1 g via INTRAVENOUS
  Administered 2020-06-13: 0 g via INTRAVENOUS
  Administered 2020-06-13: 1 g via INTRAVENOUS
  Filled 2020-06-09 (×4): qty 10

## 2020-06-09 MED ORDER — TIOTROPIUM BROMIDE 2.5 MCG/ACTUATION MIST FOR INHALATION
2.00 | Freq: Every day | RESPIRATORY_TRACT | Status: DC
Start: 2020-06-09 — End: 2020-06-09

## 2020-06-09 MED ORDER — ALUMINUM-MAG HYDROXIDE-SIMETHICONE 200 MG-200 MG-20 MG/5 ML ORAL SUSP
10.00 mL | ORAL | Status: DC | PRN
Start: 2020-06-09 — End: 2020-06-14

## 2020-06-09 MED ORDER — LORATADINE 10 MG TABLET
10.00 mg | ORAL_TABLET | Freq: Every day | ORAL | Status: DC
Start: 2020-06-09 — End: 2020-06-14
  Administered 2020-06-09: 0 mg via ORAL
  Administered 2020-06-09 – 2020-06-14 (×6): 10 mg via ORAL
  Filled 2020-06-09 (×6): qty 1

## 2020-06-09 MED ORDER — POLYETHYLENE GLYCOL 3350 17 GRAM ORAL POWDER PACKET
17.00 g | Freq: Every day | ORAL | Status: DC | PRN
Start: 2020-06-09 — End: 2020-06-14

## 2020-06-09 MED ORDER — BUTALBITAL 50 MG-ACETAMINOPHEN 325 MG TABLET
1.00 | ORAL_TABLET | ORAL | Status: DC | PRN
Start: 2020-06-09 — End: 2020-06-09

## 2020-06-09 MED ORDER — LINEZOLID IN 5% DEXTROSE IN WATER 600 MG/300 ML INTRAVENOUS PIGGYBACK
600.0000 mg | INJECTION | INTRAVENOUS | Status: AC
Start: 2020-06-09 — End: 2020-06-09
  Administered 2020-06-09: 600 mg via INTRAVENOUS
  Administered 2020-06-09: 0 mg via INTRAVENOUS
  Filled 2020-06-09: qty 300

## 2020-06-09 MED ORDER — LIPASE-PROTEASE-AMYLASE 6,000-19,000-30,000 UNIT CAPSULE,DELAYED REL
4.00 | DELAYED_RELEASE_CAPSULE | Freq: Three times a day (TID) | ORAL | Status: DC
Start: 2020-06-09 — End: 2020-06-14
  Administered 2020-06-09 – 2020-06-14 (×14): 4 via ORAL
  Filled 2020-06-09 (×14): qty 4

## 2020-06-09 MED ORDER — INSULIN LISPRO 100 UNIT/ML SUB-Q SSIP - RMH
0.00 [IU] | INJECTION | Freq: Four times a day (QID) | SUBCUTANEOUS | Status: DC
Start: 2020-06-09 — End: 2020-06-09

## 2020-06-09 MED ORDER — TIOTROPIUM BROMIDE 2.5 MCG/ACTUATION MIST FOR INHALATION
2.00 | Freq: Every day | RESPIRATORY_TRACT | Status: DC
Start: 2020-06-10 — End: 2020-06-14
  Administered 2020-06-10 – 2020-06-14 (×5): 2 via RESPIRATORY_TRACT
  Filled 2020-06-09: qty 4

## 2020-06-09 MED ORDER — SODIUM CHLORIDE 0.9 % (FLUSH) INJECTION SYRINGE
10.00 mL | INJECTION | INTRAMUSCULAR | Status: DC | PRN
Start: 2020-06-09 — End: 2020-06-14
  Administered 2020-06-12: 10 mL

## 2020-06-09 MED ORDER — SODIUM CHLORIDE 1 GRAM TABLET
1.00 g | ORAL_TABLET | Freq: Three times a day (TID) | ORAL | Status: DC
Start: 2020-06-09 — End: 2020-06-14
  Administered 2020-06-09 – 2020-06-14 (×14): 1 g via ORAL
  Filled 2020-06-09 (×14): qty 1

## 2020-06-09 MED ORDER — SODIUM CHLORIDE 0.9 % INTRAVENOUS PIGGYBACK
2.0000 g | INTRAVENOUS | Status: AC
Start: 2020-06-09 — End: 2020-06-09
  Administered 2020-06-09: 0 g via INTRAVENOUS
  Administered 2020-06-09: 2 g via INTRAVENOUS
  Filled 2020-06-09: qty 20

## 2020-06-09 MED ORDER — IOHEXOL 300 MG IODINE/ML INTRAVENOUS SOLUTION
100.00 mL | INTRAVENOUS | Status: AC
Start: 2020-06-09 — End: 2020-06-09
  Administered 2020-06-09: 100 mL via INTRAVENOUS

## 2020-06-09 MED ORDER — DILTIAZEM 30 MG TABLET
30.00 mg | ORAL_TABLET | Freq: Four times a day (QID) | ORAL | Status: DC
Start: 2020-06-09 — End: 2020-06-09
  Administered 2020-06-09: 0 mg via ORAL

## 2020-06-09 MED ORDER — INSULIN LISPRO 100 UNIT/ML SUB-Q SSIP - RMH
0.00 [IU] | INJECTION | Freq: Four times a day (QID) | SUBCUTANEOUS | Status: DC
Start: 2020-06-09 — End: 2020-06-14
  Administered 2020-06-09: 6 [IU] via SUBCUTANEOUS
  Administered 2020-06-10: 9 [IU] via SUBCUTANEOUS
  Administered 2020-06-10: 0 [IU] via SUBCUTANEOUS
  Administered 2020-06-10 (×2): 9 [IU] via SUBCUTANEOUS
  Administered 2020-06-11 (×4): 6 [IU] via SUBCUTANEOUS
  Administered 2020-06-12: 3 [IU] via SUBCUTANEOUS
  Administered 2020-06-12: 6 [IU] via SUBCUTANEOUS
  Administered 2020-06-12: 0 [IU] via SUBCUTANEOUS
  Administered 2020-06-12: 9 [IU] via SUBCUTANEOUS
  Administered 2020-06-13: 6 [IU] via SUBCUTANEOUS
  Administered 2020-06-13: 3 [IU] via SUBCUTANEOUS
  Administered 2020-06-13: 6 [IU] via SUBCUTANEOUS
  Administered 2020-06-13: 9 [IU] via SUBCUTANEOUS
  Administered 2020-06-14: 0 [IU] via SUBCUTANEOUS
  Filled 2020-06-09: qty 6
  Filled 2020-06-09: qty 3
  Filled 2020-06-09: qty 6
  Filled 2020-06-09 (×3): qty 9
  Filled 2020-06-09 (×3): qty 6
  Filled 2020-06-09: qty 9
  Filled 2020-06-09 (×3): qty 6
  Filled 2020-06-09: qty 3
  Filled 2020-06-09: qty 9

## 2020-06-09 MED ORDER — PANTOPRAZOLE 40 MG TABLET,DELAYED RELEASE
40.00 mg | DELAYED_RELEASE_TABLET | Freq: Every day | ORAL | Status: DC
Start: 2020-06-10 — End: 2020-06-14
  Administered 2020-06-10 – 2020-06-14 (×5): 40 mg via ORAL
  Filled 2020-06-09 (×5): qty 1

## 2020-06-09 NOTE — Nurses Notes (Signed)
Patient thought that her sister and her son were her MPOA.  When reviewed, patient has two sisters as MPOA.  Social services to speak with patient tomorrow to clarify.

## 2020-06-09 NOTE — ED Provider Notes (Signed)
Emergency Medicine  Attending Only Note    Name: [name]  Age and Gender: 74 y.o. female  Date of Birth: 03/07/1946  MRN: [id number]  PCP: [name] Buddy, MD    CC:  Chief Complaint   Patient presents with    Headache       HPI:  [name] is a 74 y.o. White female with history of cirrhosis, pancreatic cancer status post resection (current management?), recurrent UTIs, DM 2 who presents with altered mental status, generalized weakness with concerns for further urinary tract infection.  Patient says she has a headache, on chart review she has complained of similar headaches in the past.  She denies pain otherwise.  Normally ambulates on her own, says she is no longer able to stand up.  She is very sleepy although does respond to questioning, no noxious stimuli indicated.  Temperature by forehead thermometer of 99.9 by EMS, no documented fevers at home.  Patient denies chest or abdominal pain.    ROS:  Constitutional:  +Fatigue/malaise , somnolence and generalized weakness.  No fever, chills.   Skin: No rash or diaphoresis.  HENT: No headaches, or congestion.  Eyes: No vision changes or photophobia.  Cardio: No chest pain, palpitations or leg swelling.  Respiratory: No cough, wheezing or SOB.  GI:  No nausea, vomiting or stool changes.  GU:  No dysuria, hematuria, or increased frequency.  MSK: No muscle aches, joint or back pain.  Neuro: No seizures, LOC, numbness, tingling, or focal weakness.  Psychiatric: No depression, SI or illicit substance abuse.    All other systems reviewed and are negative.    Below pertinent information reviewed with patient:  Past Medical History:   Diagnosis Date    Abdominal hernia 10/18/2017    hx of repair    Anxiety     Arthritis     Asthma     Atrial fibrillation (CMS HCC)     Back problem     Bruises easily     Cancer (CMS [place]) 10/18/2017    chemo and radiation completed 09/24/2017    COPD (chronic obstructive pulmonary disease) (CMS HCC)     CPAP (continuous  positive airway pressure) dependence 10/18/2017    has not used recently    Depression     Diabetes mellitus, type 2 (CMS HCC)     DM (diabetes mellitus) (CMS [facility]) 2000    Fasting BG 200's. HGA1C 2018 6    Edema     Essential hypertension     GERD (gastroesophageal reflux disease)     Headache     Heartburn     Hx antineoplastic chemo 2018    Hx of radiation therapy 2018    Hypercholesteremia     Hyperlipidemia     Migraine 10/18/2017    none recently    Obesity     Palpitations     Pancreatic cancer (CMS [place]) 03/2017    Panic attack     PE (pulmonary thromboembolism) (CMS HCC) 03/29/2018    Peripheral neuropathy 10/18/2017    knees down, bilateral    Sepsis (CMS [place]) 04/15/2019    Sleep apnea     Type 2 diabetes mellitus (CMS [place]) 10/18/2017    Dx 2000 FBS 200s    Unintentional weight loss 10/18/2017    25 lbs 03/2017    UTI (urinary tract infection)     Wears glasses             Cannot display prior to  admission medications because the patient has not been admitted in this contact.          Allergies   Allergen Reactions    Sulfa (Sulfonamides) Itching       Past Surgical History:   Procedure Laterality Date    CESAREAN SECTION      COLONOSCOPY  10/04/2019    Diverticuli a colonoscopy by Dr. Tonye Becket    HX CESAREAN SECTION      HX CHOLECYSTECTOMY      HX HERNIA REPAIR      HX SUBCLAVIAN PORT IMPLANTION      s/p removed    HX SUBCLAVIAN PORT IMPLANTION      HX TONSIL AND ADENOIDECTOMY      HX TONSILLECTOMY      PANCREATICODUODENECTOMY  4098    UMBILICAL HERNIA REPAIR      x2    WHIPPLE PROCEDURE W/ LAPAROSCOPY             Family History   Problem Relation Age of Onset    Heart Disease Mother     Diabetes Mother     Heart Attack Mother 30    Other Mother 30        Bypass surgery    Respiratory Problems Father     Alzheimer's/Dementia Father 84    Lung disease Father         Black lung, lung removal    Cancer Sister     Pacemaker Sister     Heart Disease Brother      Hypertension (High Blood Pressure) Other        Social History     Tobacco Use    Smoking status: Former Smoker     Packs/day: 0.50     Years: 20.00     Pack years: 10.00     Types: Cigarettes    Smokeless tobacco: Never Used    Tobacco comment: unsure of quit date   Substance Use Topics    Alcohol use: Never       Objective:  Filed Vitals:    06/10/20 2056 06/11/20 0010 06/11/20 0500 06/11/20 0715   BP: (!) 163/67 (!) 159/68 (!) 162/58 (!) 163/47   Pulse: 76 83 69 64   Resp: 16 18 16 18    Temp: 36.9 C (98.4 F) 37.1 C (98.8 F) 36.9 C (98.5 F) 36.8 C (98.3 F)   SpO2:           Nursing notes and vital signs reviewed.    Constitutional - No acute distress.  Somnolent but awakens and responds to voice.  HEENT - Normocephalic. Atraumatic. PERRL. EOMI. Conjunctiva clear. Oropharynx with no erythema, lesions, or exudates. Moist mucous membranes.   Neck - Trachea midline. No stridor. No hoarseness.  Cardiac - Regular rate and rhythm. No murmurs, rubs, or gallops.  Respiratory - Clear to auscultation bilaterally. No rales, wheezes or rhonchi.  Abdomen - Non-tender, soft, non-distended. No rebound or guarding.   Musculoskeletal - bilateral lower extremity pitting edema, symmetric up to thighs.  Good AROM. No muscle or joint tenderness appreciated. No clubbing, cyanosis.  Skin - Warm and dry, without any rashes or other lesions.  Neuro - Cranial nerves II-XII are grossly intact.  Moving all extremities symmetrically.  Psych - somnolent but awakens and responds to voice. Answers questions appropriately.     Any pertinent labs and imaging obtained during this encounter reviewed below in MDM.    MDM/ED Course:  Presents with altered mental status  with concerns for sepsis from urinary tract infection.  Urine does appear consistent with UTI with elevated wbc's and moderate bacteria.  On review of prior cultures, she has grown VRE and will start on Zyvox and ceftriaxone for appropriate coverage.  Chest x-ray reviewed by  myself unremarkable.  She does have elevated troponins that are essentially stable from 162-180, EKG without acute ischemic changes and feel this is representative of demand ischemia and not ACS.  CT of head is unremarkable.  She does have newly elevated liver enzymes which I suspect is from congestive hepatopathy, CT abdomen and pelvis without acute findings, right upper quadrant ultrasound without common bile duct dilation greater than would be expected due to previous cholecystectomy.  Will admit to hospitalist service for further management.    Critical Care Time:  Total critical care time spent in direct care of this patient at high risk based on presenting history/exam/and complaint, including initial evaluation and stabilization, review of data, re-examination, discussion with admitting and consulting services to arrange definitive care, and exclusive of any procedures performed, was 50 minutes.           Orders Placed This Encounter    URINE CULTURE,ROUTINE    CT BRAIN WO IV CONTRAST    XR AP MOBILE CHEST    [place] RT UPPER QUADRANT    CT ABDOMEN PELVIS W IV CONTRAST    XR AP MOBILE CHEST    CBC/DIFF    COMPREHENSIVE METABOLIC PANEL, NON-FASTING    LACTIC ACID LEVEL W/ REFLEX FOR LEVEL >2.0    TROPONIN-I    URINALYSIS, MACROSCOPIC AND MICROSCOPIC    DRUG SCREEN, NO CONFIRMATION, URINE    CBC WITH DIFF    URINALYSIS, MACROSCOPIC    URINALYSIS, MICROSCOPIC    EXTRA TUBES    BLUE TOP TUBE    ACETAMINOPHEN LEVEL    TROPONIN-I    B-TYPE NATRIURETIC PEPTIDE    CBC/DIFF    COMPREHENSIVE METABOLIC PANEL, NON-FASTING    COVID-19 - SCREENING - Admission (NON-PUI)    TROPONIN-I    CBC WITH DIFF    MORPHOLOGY    CBC/DIFF    COMPREHENSIVE METABOLIC PANEL, NON-FASTING    CBC WITH DIFF    ECG 12-LEAD    PERFORM POC WHOLE BLOOD GLUCOSE    INSERT & MAINTAIN PERIPHERAL IV ACCESS    PERIPHERAL IV DRESSING CHANGE EVERY 96 HOURS AND PRN    PATIENT CLASS/LEVEL OF CARE DESIGNATION - SMR    linezolid  (ZYVOX) 600 mg in iso-osmotic 300 mL premix IVPB    cefTRIAXone (ROCEPHIN) 2 g in NS 100 mL IVPB minibag    NS bolus infusion 1,000 mL    iohexol (OMNIPAQUE 300) infusion    LORazepam (ATIVAN) tablet    loratadine (CLARITIN) tablet    atorvastatin (LIPITOR) tablet    losartan (COZAAR) tablet    hydrALAZINE (APRESOLINE) tablet    metoprolol succinate (TOPROL-XL) 24 hr extended release tablet    dextrose 50% (0.5 g/mL) injection - syringe    insulin glargine 100 units/mL injection    furosemide (LASIX) tablet    pancreatic enzyme replacement (CREON) 6,000 units lipase per cap    aspirin (ECOTRIN) enteric coated tablet 81 mg    pantoprazole (PROTONIX) delayed release tablet    sodium chloride tablet    nitroGLYCERIN (NITROSTAT) sublingual tablet    acetaminophen (TYLENOL) tablet    aluminum-magnesium hydroxide-simethicone (MAG-AL PLUS) 200-200-20 mg per 5 mL oral liquid    magnesium hydroxide (MILK OF MAGNESIA) 400mg   per 18mL oral liquid    ondansetron (ZOFRAN) 2 mg/mL injection    polyethylene glycol (MIRALAX) oral packet    NS flush syringe    NS flush syringe    enoxaparin PF (LOVENOX) 30 mg/0.3 mL SubQ injection    NS premix infusion    linezolid (ZYVOX) 600 mg in iso-osmotic 300 mL premix IVPB    cefTRIAXone (ROCEPHIN) 1 g in NS 100 mL IVPB minibag    tiotropium bromide (SPIRIVA RESPIMAT) 2.5 mcg per inhalation oral inhaler - "Respiratory to administer"    SSIP insulin lispro (HUMALOG) 100 units/mL injection    AND Linked Order Group     oxyCODONE (ROXICODONE) immediate release tablet     acetaminophen (TYLENOL) tablet    dilTIAZem (CARDIZEM) tablet    heparin flush PF (HEPLOCK) 100 units/mL injection       Any procedures:  Procedures    Impression:   Encounter Diagnoses   Name Primary?    Complicated urinary tract infection Yes    Sepsis due to urinary tract infection (CMS HCC)     NSTEMI (non-ST elevated myocardial infarction) (CMS HCC)     Altered mental status, unspecified  altered mental status type          Disposition: Admitted      Portions of this note may have been dictated using voice recognition software.     Gates Rigg, MD  [facility] Department of Emergency Medicine    -----------------------  Results for orders placed or performed during the hospital encounter of 06/09/20 (from the past 12 hour(s))   POC BLOOD GLUCOSE (RESULTS)   Result Value Ref Range    GLUCOSE, POC 340 (H) 60 - 110 mg/dl   COMPREHENSIVE METABOLIC PANEL, NON-FASTING   Result Value Ref Range    SODIUM 125 (L) 136 - 145 mmol/L    POTASSIUM 3.2 (L) 3.5 - 5.1 mmol/L    CHLORIDE 97 96 - 111 mmol/L    CO2 TOTAL 22 (L) 23 - 31 mmol/L    ANION GAP 6 4 - 13 mmol/L    BUN 18 8 - 25 mg/dL    CREATININE 1.32 (H) 0.60 - 1.05 mg/dL    BUN/CREA RATIO 14 6 - 22    ESTIMATED GFR 40 (L) >=60 mL/min/BSA    ALBUMIN 2.2 (L) 3.4 - 4.8 g/dL     CALCIUM 7.8 (L) 8.8 - 10.2 mg/dL    GLUCOSE 209 (H) 65 - 125 mg/dL    ALKALINE PHOSPHATASE 212 (H) 55 - 145 U/L    ALT (SGPT) 96 (H) 8 - 22 U/L    AST (SGOT)  66 (H) 8 - 45 U/L    BILIRUBIN TOTAL 0.3 0.3 - 1.3 mg/dL    PROTEIN TOTAL 4.3 (L) 6.0 - 8.0 g/dL   CBC WITH DIFF   Result Value Ref Range    WBC 6.3 3.7 - 11.0 x103/uL    RBC 3.22 (L) 3.85 - 5.22 x106/uL    HGB 8.9 (L) 11.5 - 16.0 g/dL    HCT 27.7 (L) 34.8 - 46.0 %    MCV 86.0 78.0 - 100.0 fL    MCH 27.6 26.0 - 32.0 pg    MCHC 32.1 31.0 - 35.5 g/dL    RDW-CV 14.1 11.5 - 15.5 %    PLATELETS 53 (L) 150 - 400 x103/uL    NEUTROPHIL % 80 %    LYMPHOCYTE % 10 %    MONOCYTE % 7 %    EOSINOPHIL %  1 %    BASOPHIL % 1 %    NEUTROPHIL # 5.13 1.50 - 7.70 x103/uL    LYMPHOCYTE # 0.62 (L) 1.00 - 4.80 x103/uL    MONOCYTE # 0.44 0.20 - 1.10 x103/uL    EOSINOPHIL # <0.10 <=0.50 x103/uL    BASOPHIL # <0.10 <=0.20 x103/uL    IMMATURE GRANULOCYTE % 1 0 - 1 %    IMMATURE GRANULOCYTE # <0.10 <0.10 x103/uL   POC BLOOD GLUCOSE (RESULTS)   Result Value Ref Range    GLUCOSE, POC 216 (H) 60 - 110 mg/dl     CT ABDOMEN PELVIS W IV  CONTRAST   Final Result   There is no intrahepatic ductal dilatation. Liver mass is not   seen. There is some ascites related to the liver.      There is also ascites related to the spleen and the spleen is of normal   size.      Increase in fecal burden consistent with constipation.      Large left low-lying ventral hernia. Bowel loops in it but the hiatus is   wide.      There does not appear to be major compromise of the intestinal loops. A   significant mechanical obstruction is not seen but there is mild prominence   of small bowel loops with excess fluid. Probably an enteritis pattern. Very   low-grade small bowel obstruction not entirely excluded.         The CT exam was performed using one or more the following a dose reduction   techniques: Automated exposure control, adjustment of the mA and/or kV   according to the patient's size, or use of iterative reconstruction   technique.      This note was partially created using voice recognition software and is   inherently subject to errors including those of syntax and "sound alike "   substitutions which may escape proof reading.  In such instances, original   meaning may be extrapolated by contextual derivation.         Radiologist location ID: [id number]         XR AP MOBILE CHEST   Final Result   Lung marking prominence. Differential may relate to technical   factors or viral interstitial pneumonia or early cardiac decompensation.      This note was partially created using voice recognition software and is   inherently subject to errors including those of syntax and "sound alike "   substitutions which may escape proof reading.  In such instances, original   meaning may be extrapolated by contextual derivation.         Radiologist location ID: [id number]         [place] RT UPPER QUADRANT   Final Result   There is ascites related to the liver.      This note was partially created using voice recognition software and is   inherently subject to errors including those of  syntax and "sound alike "   substitutions which may escape proof reading.  In such instances, original   meaning may be extrapolated by contextual derivation.         Radiologist location ID: [id number]         CT BRAIN WO IV CONTRAST   Final Result   No change from prior. No new active intracranial pathology.      There are some age appropriate appearances.         The CT exam  was performed using one or more the following a dose reduction   techniques: Automated exposure control, adjustment of the mA and/or kV   according to the patient's size, or use of iterative reconstruction   technique.      This note was partially created using voice recognition software and is   inherently subject to errors including those of syntax and "sound alike "   substitutions which may escape proof reading.  In such instances, original   meaning may be extrapolated by contextual derivation.         Radiologist location ID: [id number]         XR AP MOBILE CHEST    (Results Pending)

## 2020-06-09 NOTE — Telephone Encounter (Signed)
Talked with ER Dr. About this

## 2020-06-09 NOTE — ED Nurses Note (Signed)
Awaiting disposition for admission

## 2020-06-09 NOTE — ED Nurses Note (Signed)
Family updated on patient status.

## 2020-06-09 NOTE — ED Nurses Note (Signed)
Report received and assessment completed.

## 2020-06-09 NOTE — ED Nurses Note (Signed)
Report to [facility] per Christain Sacramento RN

## 2020-06-09 NOTE — Telephone Encounter (Signed)
Patients grand daughter, Stanton Kidney called    She stated her grandma has appt with Dr mark this AM, but she is so so so so sorry to say they will cancel this appt    They have called Ambulance to come and get her as they feel she has a UTI.    They said she was up doing work and cleaning windows yesterday and today she is not feeling well and need for her to be evaluated in the ED.    They will reset an appt with Dr Fatima Sanger.    Patient on her way up for ED and if any further orders/labs needed to let them know    Lucretia Field, RN

## 2020-06-09 NOTE — Nurses Notes (Signed)
ER physician to address glucose level before transfer to medsurg.

## 2020-06-09 NOTE — H&P (Signed)
[facility]  Hospitalist  History & Physical    Date of Service:  06/09/2020  [name], [name], 74 y.o. female  Date of Admission:  06/09/2020  Date of Birth:  1946-07-28  PCP: Myrene Buddy, MD    Chief Complaint:  Headache.    HPI:    [name] is a 74 y.o. White female who is admitted for urinary tract infection and elevated troponin.  Patient presented to the emergency department with complaints of a headache.  Patient has frequent headaches has been to the emergency department for them numerous times.  Patient normally ambulates on her own but she says she is no longer able to stand up.  The patient has a history of cirrhosis, pancreatic cancer status post [place], and recurrent UTIs.  The patient did not wish to speak with me and said she did feel well so the history was obtained from the medical record.    Past Medical History:   Diagnosis Date   . Abdominal hernia 10/18/2017    hx of repair   . Anxiety    . Arthritis    . Asthma    . Atrial fibrillation (CMS HCC)    . Back problem    . Bruises easily    . Cancer (CMS [facility]) 10/18/2017    chemo and radiation completed 09/24/2017   . COPD (chronic obstructive pulmonary disease) (CMS HCC)    . CPAP (continuous positive airway pressure) dependence 10/18/2017    has not used recently   . Depression    . Diabetes mellitus, type 2 (CMS HCC)    . DM (diabetes mellitus) (CMS HCC) 2000    Fasting BG 200's. HGA1C 2018 6   . Edema    . Essential hypertension    . GERD (gastroesophageal reflux disease)    . Headache    . Heartburn    . Hx antineoplastic chemo 2018   . Hx of radiation therapy 2018   . Hypercholesteremia    . Hyperlipidemia    . Migraine 10/18/2017    none recently   . Obesity    . Palpitations    . Pancreatic cancer (CMS [place]) 03/2017   . Panic attack    . PE (pulmonary thromboembolism) (CMS HCC) 03/29/2018   . Peripheral neuropathy 10/18/2017    knees down, bilateral   . Sepsis (CMS [place]) 04/15/2019   . Sleep apnea    . Type 2  diabetes mellitus (CMS HCC) 10/18/2017    Dx 2000 FBS 200s   . Unintentional weight loss 10/18/2017    25 lbs 03/2017   . UTI (urinary tract infection)    . Wears glasses       Past Surgical History:   Procedure Laterality Date   . CESAREAN SECTION     . COLONOSCOPY  10/04/2019    Diverticuli a colonoscopy by Dr. Tonye Becket   . HX CESAREAN SECTION     . HX CHOLECYSTECTOMY     . HX HERNIA REPAIR     . HX SUBCLAVIAN PORT IMPLANTION      s/p removed   . HX SUBCLAVIAN PORT IMPLANTION     . HX TONSIL AND ADENOIDECTOMY     . HX TONSILLECTOMY     . PANCREATICODUODENECTOMY  2018   . UMBILICAL HERNIA REPAIR      x2   . WHIPPLE PROCEDURE W/ LAPAROSCOPY        Social History     Tobacco Use   .  Smoking status: Former Smoker     Packs/day: 0.50     Years: 20.00     Pack years: 10.00     Types: Cigarettes   . Smokeless tobacco: Never Used   . Tobacco comment: unsure of quit date   Vaping Use   . Vaping Use: Never used   Substance Use Topics   . Alcohol use: Never   . Drug use: Never       Family Medical History:     Problem Relation (Age of Onset)    Alzheimer's/Dementia Father (27)    Cancer Sister    Diabetes Mother    Heart Attack Mother (30)    Heart Disease Mother, Brother    Hypertension (High Blood Pressure) Other    Lung disease Father    Other Mother (97)    Pacemaker Sister    Respiratory Problems Father         Medications Prior to Admission     Prescriptions    Acetaminophen-Butalbital 50-325 mg Oral Tablet    Take by mouth    aspirin (ECOTRIN) 81 mg Oral Tablet, Delayed Release (E.C.)    Take 1 Tab (81 mg total) by mouth Once a day    atorvastatin (LIPITOR) 10 mg Oral Tablet    TAKE 1 TABLET BY MOUTH ONCE DAILY    cetirizine (ZYRTEC) 10 mg Oral Tablet    Take 1 Tab (10 mg total) by mouth Once a day    dilTIAZem (CARDIZEM) 30 mg Oral Tablet    Take 1 Tablet (30 mg total) by mouth Four times a day for 30 days    furosemide (LASIX) 80 mg Oral Tablet    Take 1.5 Tablets (120 mg total) by mouth Once a day for 90 days     hydrALAZINE (APRESOLINE) 25 mg Oral Tablet    Take 1 Tablet (25 mg total) by mouth Twice daily for 30 days    insulin aspart U-100 (NOVOLOG FLEXPEN U-100 INSULIN) 100 unit/mL (3 mL) Subcutaneous Insulin Pen    7 Units by Subcutaneous route Twice a day before meals    Patient taking differently:  by Subcutaneous route Twice a day before meals Per sliding scale    insulin glargine (LANTUS SOLOSTAR U-100 INSULIN) 100 unit/mL Subcutaneous Insulin Pen    10 Units by Subcutaneous route Every night    lidocaine HCL (XYLOCAINE) 2 % Mucous Membrane Solution    5 mL    LORazepam (ATIVAN) 0.5 mg Oral Tablet    Take 1 Tablet (0.5 mg total) by mouth Every evening    losartan (COZAAR) 100 mg Oral Tablet    Take 1 Tablet (100 mg total) by mouth Once a day for 90 days    metoprolol succinate (TOPROL-XL) 50 mg Oral Tablet Sustained Release 24 hr    Take 1 Tablet (50 mg total) by mouth Once a day    NANO PEN NEEDLE 32 gauge x 5/32" Needle    USE WITH INSULIN FLEXPEN BID AS NEEDED    nitrofurantoin (MACROBID) 100 mg Oral Capsule    Take 1 Capsule (100 mg total) by mouth Every night for 30 days    nitroGLYCERIN (NITROSTAT) 0.4 mg Sublingual Tablet, Sublingual    1 Tablet (0.4 mg total) by Sublingual route Every 5 minutes as needed for Chest pain for 3 doses over 15 minutes    Patient not taking:  Reported on 06/09/2020    omeprazole (PRILOSEC) 40 mg Oral Capsule, Delayed Release(E.C.)    Take  1 Capsule (40 mg total) by mouth Once a day    ondansetron (ZOFRAN) 4 mg Oral Tablet    Take 1 Tablet (4 mg total) by mouth Every 6 hours as needed for Nausea/Vomiting for up to 30 days    oxyCODONE-acetaminophen (PERCOCET) 7.5-325 mg Oral Tablet    Take 1 Tablet by mouth Every 6 hours as needed for Pain    pancreatic enzyme replacement (CREON) 12,000-38,000 -60,000 unit Oral Capsule, Delayed Release(E.C.)    Take 2 Capsules by mouth Three times daily with meals 2 CAPS WITH MEALS AND 1 CAP WITH EACH SNACK    sodium chloride 1 gram Oral Tablet     Take 1 Tablet (1 g total) by mouth Three times daily with meals for 30 days    tiotropium bromide (SPIRIVA HANDIHALER) 18 mcg Inhalation Capsule, w/Inhalation Device    Take 1 Capsule (18 mcg total) by inhalation Once a day for 180 days         Allergies   Allergen Reactions   . Sulfa (Sulfonamides) Itching        Review of Systems   Constitutional: Positive for malaise/fatigue. Negative for chills and fever.   HENT: Negative for congestion, ear pain, sore throat and tinnitus.    Eyes: Negative for blurred vision, photophobia and pain.   Respiratory: Negative for cough, hemoptysis, sputum production, shortness of breath and wheezing.    Cardiovascular: Negative for chest pain, palpitations, orthopnea, claudication, leg swelling and PND.   Gastrointestinal: Negative for abdominal pain, diarrhea, heartburn, nausea and vomiting.   Genitourinary: Negative for dysuria, frequency and urgency.   Musculoskeletal: Negative for back pain, joint pain and myalgias.   Skin: Negative for rash.   Neurological: Positive for headaches. Negative for dizziness, sensory change, speech change, focal weakness and weakness.   Endo/Heme/Allergies: Negative for environmental allergies. Does not bruise/bleed easily.   Psychiatric/Behavioral: Negative for depression and substance abuse. The patient is not nervous/anxious.           Filed Vitals:    06/09/20 1600 06/09/20 1630 06/09/20 1710 06/09/20 1745   BP: (!) 184/83 (!) 159/44 (!) 178/64    Pulse: 75  72    Resp: (!) 23  (!) 24    Temp:   36.9 C (98.4 F)    SpO2: 97%   98%       Physical Exam  Constitutional:       General: She is awake. She is not in acute distress.     Appearance: Normal appearance. She is well-developed and normal weight. She is ill-appearing. She is not toxic-appearing. Diaphoretic: Chronic.   HENT:      Head: Normocephalic and atraumatic.   Eyes:      Conjunctiva/sclera: Conjunctivae normal.      Pupils: Pupils are equal, round, and reactive to light.    Cardiovascular:      Rate and Rhythm: Normal rate. Rhythm regularly irregular.      Heart sounds: Normal heart sounds. No murmur heard.   No friction rub. No gallop.    Pulmonary:      Effort: No respiratory distress.      Breath sounds: Normal breath sounds and air entry. No wheezing or rales.   Abdominal:      General: Bowel sounds are normal. There is no distension.      Palpations: Abdomen is soft. There is no mass.      Tenderness: There is no abdominal tenderness. There is no guarding or rebound.  Musculoskeletal:         General: No deformity. Normal range of motion.      Cervical back: Normal range of motion and neck supple.   Skin:     General: Skin is warm and dry.      Findings: No erythema or rash.   Neurological:      Mental Status: She is alert and oriented to person, place, and time. Mental status is at baseline.      Cranial Nerves: No cranial nerve deficit.   Psychiatric:         Mood and Affect: Affect normal.         Behavior: Behavior is uncooperative.         Cognition and Memory: Cognition and memory normal.         Laboratory Data:     Results for orders placed or performed during the hospital encounter of 06/09/20 (from the past 24 hour(s))   ACETAMINOPHEN LEVEL   Result Value Ref Range    ACETAMINOPHEN LEVEL  <5 (L) 10 - 30 ug/mL    COMPREHENSIVE METABOLIC PANEL, NON-FASTING   Result Value Ref Range    SODIUM 130 (L) 136 - 145 mmol/L    POTASSIUM 3.7 3.5 - 5.1 mmol/L    CHLORIDE 95 (L) 96 - 111 mmol/L    CO2 TOTAL 27 23 - 31 mmol/L    ANION GAP 8 4 - 13 mmol/L    BUN 25 8 - 25 mg/dL    CREATININE 1.05 0.60 - 1.05 mg/dL    BUN/CREA RATIO 24 (H) 6 - 22    ESTIMATED GFR 53 (L) >=60 mL/min/BSA    ALBUMIN 2.8 (L) 3.4 - 4.8 g/dL     CALCIUM 8.2 (L) 8.8 - 10.2 mg/dL    GLUCOSE 60 (L) 65 - 125 mg/dL    ALKALINE PHOSPHATASE 440 (H) 55 - 145 U/L    ALT (SGPT) 332 (H) 8 - 22 U/L    AST (SGOT)  703 (H) 8 - 45 U/L    BILIRUBIN TOTAL 0.6 0.3 - 1.3 mg/dL    PROTEIN TOTAL 4.9 (L) 6.0 - 8.0 g/dL   LACTIC  ACID LEVEL W/ REFLEX FOR LEVEL >2.0   Result Value Ref Range    LACTIC ACID 1.0 0.5 - 2.2 mmol/L   TROPONIN-I   Result Value Ref Range    TROPONIN I 162 (HH) 7 - 30 ng/L   CBC WITH DIFF   Result Value Ref Range    WBC 14.5 (H) 3.7 - 11.0 x10^3/uL    RBC 3.68 (L) 3.85 - 5.22 x10^6/uL    HGB 10.3 (L) 11.5 - 16.0 g/dL    HCT 31.5 (L) 34.8 - 46.0 %    MCV 85.6 78.0 - 100.0 fL    MCH 28.0 26.0 - 32.0 pg    MCHC 32.7 31.0 - 35.5 g/dL    RDW-CV 13.9 11.5 - 15.5 %    PLATELETS 76 (L) 150 - 400 x10^3/uL    MPV 11.5 8.7 - 12.5 fL    NEUTROPHIL % 91 %    LYMPHOCYTE % 3 %    MONOCYTE % 5 %    EOSINOPHIL % 0 %    BASOPHIL % 0 %    NEUTROPHIL # 13.24 (H) 1.50 - 7.70 x10^3/uL    LYMPHOCYTE # 0.42 (L) 1.00 - 4.80 x10^3/uL    MONOCYTE # 0.67 0.20 - 1.10 x10^3/uL    EOSINOPHIL # <0.10 <=0.50 x10^3/uL  BASOPHIL # <0.10 <=0.20 x10^3/uL    IMMATURE GRANULOCYTE % 1 0 - 1 %    IMMATURE GRANULOCYTE # <0.10 <0.10 x10^3/uL   B-TYPE NATRIURETIC PEPTIDE   Result Value Ref Range    BNP 952 (H) <=99 pg/mL   DRUG SCREEN, NO CONFIRMATION, URINE   Result Value Ref Range    AMPHETAMINES, URINE Negative Negative    BARBITURATES URINE Negative Negative    BENZODIAZEPINES URINE Negative Negative    BUPRENORPHINE URINE Negative Negative    CANNABINOIDS URINE Negative Negative    COCAINE METABOLITES URINE Negative Negative    METHADONE URINE Negative Negative    OPIATES URINE (LOW CUTOFF) Negative Negative    OXYCODONE URINE Positive (A) Negative    ECSTASY/MDMA URINE Negative Negative    FENTANYL, RANDOM URINE Negative Negative    CREATININE RANDOM URINE 59 50 - 100 mg/dL   URINALYSIS, MACROSCOPIC   Result Value Ref Range    COLOR Yellow Yellow, Straw    APPEARANCE Clear Clear, Slightly Hazy    SPECIFIC GRAVITY 1.020 1.005 - 1.030    PH 6.5 5.0 - 8.5    LEUKOCYTES Negative Negative WBCs/uL    NITRITE Negative Negative    PROTEIN >= 300 (A) Negative mg/dL    GLUCOSE Negative Negative mg/dL    KETONES Negative Negative mg/dL    UROBILINOGEN 0.2  0.2  mg/dL    BILIRUBIN Negative Negative mg/dL    BLOOD Trace (A) Negative mg/dL   URINALYSIS, MICROSCOPIC   Result Value Ref Range    RBCS 11-15 (A) None, Occasional, 0-2, 3-5 /hpf    WBCS 16-20 (A) None, Occasional, 0-2, 3-5 /hpf    BACTERIA Moderate (A) None /hpf    SQUAMOUS EPITHELIAL 0-2 None, Occasional, 0-2, 3-5 /hpf   BLUE TOP TUBE   Result Value Ref Range    RAINBOW/EXTRA TUBE AUTO RESULT Yes    TROPONIN-I   Result Value Ref Range    TROPONIN I 180 (HH) 7 - 30 ng/L   POC BLOOD GLUCOSE (RESULTS)   Result Value Ref Range    GLUCOSE, POC 91 60 - 110 mg/dl       Imaging Studies:    CT ABDOMEN PELVIS W IV CONTRAST   Final Result by Edi, Radresults In (06/29 1348)   There is no intrahepatic ductal dilatation. Liver mass is not   seen. There is some ascites related to the liver.      There is also ascites related to the spleen and the spleen is of normal   size.      Increase in fecal burden consistent with constipation.      Large left low-lying ventral hernia. Bowel loops in it but the hiatus is   wide.      There does not appear to be major compromise of the intestinal loops. A   significant mechanical obstruction is not seen but there is mild prominence   of small bowel loops with excess fluid. Probably an enteritis pattern. Very   low-grade small bowel obstruction not entirely excluded.         The CT exam was performed using one or more the following a dose reduction   techniques: Automated exposure control, adjustment of the mA and/or kV   according to the patient's size, or use of iterative reconstruction   technique.      This note was partially created using voice recognition software and is   inherently subject to errors including those of syntax and "  sound alike "   substitutions which may escape proof reading.  In such instances, original   meaning may be extrapolated by contextual derivation.         Radiologist location ID: [id number]         XR AP MOBILE CHEST   Final Result by Edi, Radresults In (06/29  1754)   Lung marking prominence. Differential may relate to technical   factors or viral interstitial pneumonia or early cardiac decompensation.      This note was partially created using voice recognition software and is   inherently subject to errors including those of syntax and "sound alike "   substitutions which may escape proof reading.  In such instances, original   meaning may be extrapolated by contextual derivation.         Radiologist location ID: [id number]         [place] RT UPPER QUADRANT   Final Result by Edi, Radresults In (06/29 1459)   There is ascites related to the liver.      This note was partially created using voice recognition software and is   inherently subject to errors including those of syntax and "sound alike "   substitutions which may escape proof reading.  In such instances, original   meaning may be extrapolated by contextual derivation.         Radiologist location ID: [id number]         CT BRAIN WO IV CONTRAST   Final Result by Edi, Radresults In (06/29 1123)   No change from prior. No new active intracranial pathology.      There are some age appropriate appearances.         The CT exam was performed using one or more the following a dose reduction   techniques: Automated exposure control, adjustment of the mA and/or kV   according to the patient's size, or use of iterative reconstruction   technique.      This note was partially created using voice recognition software and is   inherently subject to errors including those of syntax and "sound alike "   substitutions which may escape proof reading.  In such instances, original   meaning may be extrapolated by contextual derivation.         Radiologist location ID: [id number]             Assessment/Plan:  Active Hospital Problems    Diagnosis   . UTI (urinary tract infection)   . 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 Longstanding persistent atrial fibrillation (CMS HCC)   . CHF (congestive heart failure) (CMS HCC)   . COPD (chronic  obstructive pulmonary disease) (CMS HCC)   . Essential hypertension   . Hyperlipidemia       . Urinary tract infection.  Patient will be started on Rocephin and Zyvox based on previous urine isolates.  Patient be started on normal saline at 75 mL/hour.  Awaiting urine culture.  Repeat lab work in the morning.  . Cirrhosis of the liver with ascites.  Chronic stable.  . Atrial fibrillation.  Rate controlled continue Cardizem and metoprolol.  . CHF.  Monitor for fluid overload.  Continue home medications.  . COPD.  Continue Spiriva.  . Hypertension.  Chronic stable.  Continue medications.  . Hyperlipidemia.  Continue Lipitor.  . DVT prophylaxis.  Lovenox.    Bethanie Dicker, MD

## 2020-06-09 NOTE — ED Nurses Note (Signed)
Specimen collected and sent to lab.

## 2020-06-09 NOTE — Nurses Notes (Signed)
Patient is alert and oriented but does not know her medication list or if she took her medications today.  Patient requests for nurse to call daughter in law stating " I'm not sure what I take, I take what she gives me" .  Attempt to call home phone as patient reports that she lives with her son and his wife, and attempt to call son cell phone number, no answer at either number.  Assessment complete.

## 2020-06-09 NOTE — Nurses Notes (Signed)
Received report from Christain [place] RN at this time.

## 2020-06-09 NOTE — ED Nurses Note (Signed)
Awaiting admission bed

## 2020-06-09 NOTE — Nurses Notes (Signed)
[name] Economy, RN performed follow up accucheck, result 91.  Patient arrived to medsurg at this time via stretcher.

## 2020-06-09 NOTE — ED Triage Notes (Signed)
Pt c/o headache onset this morning.

## 2020-06-10 LAB — COMPREHENSIVE METABOLIC PANEL, NON-FASTING
ALBUMIN: 1.9 g/dL — ABNORMAL LOW (ref 3.4–4.8)
ALKALINE PHOSPHATASE: 256 U/L — ABNORMAL HIGH (ref 55–145)
ALT (SGPT): 155 U/L — ABNORMAL HIGH (ref 8–22)
ANION GAP: 6 mmol/L (ref 4–13)
AST (SGOT): 201 U/L — ABNORMAL HIGH (ref 8–45)
BILIRUBIN TOTAL: 0.3 mg/dL (ref 0.3–1.3)
BUN/CREA RATIO: 22 (ref 6–22)
BUN: 19 mg/dL (ref 8–25)
CALCIUM: 6.7 mg/dL — ABNORMAL LOW (ref 8.8–10.2)
CHLORIDE: 101 mmol/L (ref 96–111)
CO2 TOTAL: 22 mmol/L — ABNORMAL LOW (ref 23–31)
CREATININE: 0.85 mg/dL (ref 0.60–1.05)
ESTIMATED GFR: 69 mL/min/BSA (ref >=60)
GLUCOSE: 174 mg/dL — ABNORMAL HIGH (ref 65–125)
POTASSIUM: 3 mmol/L — ABNORMAL LOW (ref 3.5–5.1)
PROTEIN TOTAL: 3.8 g/dL — ABNORMAL LOW (ref 6.0–8.0)
SODIUM: 129 mmol/L — ABNORMAL LOW (ref 136–145)

## 2020-06-10 LAB — CBC WITH DIFF
BASOPHIL #: <0.1 10*3/uL (ref <=0.20)
BASOPHIL %: 0 %
EOSINOPHIL #: <0.1 10*3/uL (ref <=0.50)
EOSINOPHIL %: 0 %
HCT: 25.4 % — ABNORMAL LOW (ref 34.8–46.0)
HGB: 8.2 g/dL — ABNORMAL LOW (ref 11.5–16.0)
IMMATURE GRANULOCYTE #: <0.1 10*3/uL (ref <0.10)
IMMATURE GRANULOCYTE %: 1 % (ref 0–1)
LYMPHOCYTE #: 0.59 10*3/uL — ABNORMAL LOW (ref 1.00–4.80)
LYMPHOCYTE %: 7 %
MCH: 27.8 pg (ref 26.0–32.0)
MCHC: 32.3 g/dL (ref 31.0–35.5)
MCV: 86.1 fL (ref 78.0–100.0)
MONOCYTE #: 0.43 10*3/uL (ref 0.20–1.10)
MONOCYTE %: 5 %
MPV: 12.4 fL (ref 8.7–12.5)
NEUTROPHIL #: 7.63 10*3/uL (ref 1.50–7.70)
NEUTROPHIL %: 87 %
PLATELETS: 49 10*3/uL — ABNORMAL LOW (ref 150–400)
RBC: 2.95 10*6/uL — ABNORMAL LOW (ref 3.85–5.22)
RDW-CV: 14.3 % (ref 11.5–15.5)
WBC: 8.7 10*3/uL (ref 3.7–11.0)

## 2020-06-10 LAB — URINE CULTURE,ROUTINE: URINE CULTURE: NO GROWTH

## 2020-06-10 LAB — MORPHOLOGY: RBC MORPHOLOGY: NORMAL

## 2020-06-10 LAB — POC BLOOD GLUCOSE (RESULTS)
GLUCOSE, POC: 158 mg/dl — ABNORMAL HIGH (ref 60–110)
GLUCOSE, POC: 333 mg/dl — ABNORMAL HIGH (ref 60–110)
GLUCOSE, POC: 342 mg/dl — ABNORMAL HIGH (ref 60–110)
GLUCOSE, POC: 340 mg/dl — ABNORMAL HIGH (ref 60–110)

## 2020-06-10 LAB — TROPONIN-I
TROPONIN I: 117 ng/L — ABNORMAL HIGH (ref 7–30)
TROPONIN I: 79 ng/L — ABNORMAL HIGH (ref 7–30)

## 2020-06-10 MED ORDER — HEPARIN, PORCINE (PF) 100 UNIT/ML INTRAVENOUS SYRINGE
5.00 mL | INJECTION | Freq: Three times a day (TID) | INTRAVENOUS | Status: DC | PRN
Start: 2020-06-10 — End: 2020-06-14
  Administered 2020-06-10: 5 mL
  Filled 2020-06-10: qty 5

## 2020-06-10 NOTE — Nurses Notes (Addendum)
Unable to draw blood from port, difficult to flush, and appears to possibly be leaking. Heparin flushed and left to set in port. Port started giving slow blood return but still difficult to flush. Port de-accessed. Area cleansed per policy, new access placed, flushed easily and gives blood return promptly. IV NS infusing to mediport now.

## 2020-06-10 NOTE — Nurses Notes (Signed)
Pt requested PRN pain medication. Pt was asked if it was nausea or pain, pt stated it was pain. Medication given.

## 2020-06-10 NOTE — Care Plan (Signed)
Problem: Pain Acute  Goal: Acceptable Pain Control and Functional Ability  Intervention: Develop Pain Management Plan  Flowsheets (Taken 06/10/2020 1843)  Pain Management Interventions:   awakened for pain meds per patient request   position adjusted  Intervention: Prevent or Manage Pain  Flowsheets (Taken 06/10/2020 1843)  Medication Review/Management: medications reviewed  Intervention: Optimize Psychosocial Wellbeing  Flowsheets  Taken 06/10/2020 1843 by Ollen Gross, [place] Activities: television  Taken 06/09/2020 2015 by Wynn Maudlin, RN  Supportive Measures:   active listening utilized   counseling provided   decision-making supported   goal-setting facilitated  Ollen Gross, LPN  03/20/1447, 18:56

## 2020-06-10 NOTE — Nurses Notes (Signed)
Order given for heparin flushes q8h prn for accessed mediport per dr.[email or web address]

## 2020-06-10 NOTE — Care Plan (Signed)
Initiation of care plans"      Problem: Adult Inpatient Plan of Care  Goal: Plan of Care Review  06/10/2020 0251 by Wynn Maudlin, RN  Outcome: Ongoing (see interventions/notes)  06/10/2020 0251 by Wynn Maudlin, RN  Outcome: Ongoing (see interventions/notes)  Goal: Patient-Specific Goal (Individualized)  06/10/2020 0251 by Wynn Maudlin, RN  Outcome: Ongoing (see interventions/notes)  06/10/2020 0251 by Wynn Maudlin, RN  Outcome: Ongoing (see interventions/notes)  Goal: Absence of Hospital-Acquired Illness or Injury  06/10/2020 0251 by Wynn Maudlin, RN  Outcome: Ongoing (see interventions/notes)  06/10/2020 0251 by Wynn Maudlin, RN  Outcome: Ongoing (see interventions/notes)  Goal: Optimal Comfort and Wellbeing  06/10/2020 0251 by Wynn Maudlin, RN  Outcome: Ongoing (see interventions/notes)  06/10/2020 0251 by Wynn Maudlin, RN  Outcome: Ongoing (see interventions/notes)

## 2020-06-10 NOTE — Care Management Notes (Signed)
[facility]  Care Management Note    Patient Name: [name]  Date of Birth: [birth year]  Sex: female  Date/Time of Admission: 06/09/2020 10:22 AM  Room/Bed: 1129/B  Payor: MEDICARE / Plan: MEDICARE PART A AND B / Product Type: Medicare /    LOS: 0 days   Primary Care Providers:  Myrene Buddy, MD, MD    (General)  Lerry Liner, MD, MD (Oncology -  Primary)  Royetta Crochet, MD, MD   (Surgeon)    Admitting Diagnosis:  UTI (urinary tract infection) [N39.0]    Assessment:   Consult for updated MPOA. Spoke with patient and provided copy of MPOA from chart to patient and reviewed with her. Patient states did think son was on the MPOA but now is aware her son was not put on form. States does not want to go through changing the form . States ," If I'm dying I dont care who makes the decisions." Left copy of MPOA form with patient .     Discharge Plan:  Home (Patient/Family Member/other) (code 1)      The patient will continue to be evaluated for developing discharge needs.     Case Manager: Raelene Bott, CASE MANAGER  Phone: [phone] ext [phone]

## 2020-06-10 NOTE — Nurses Notes (Signed)
Pt resting with eyes closed, I did wake her to ask about her pain. Pt states pain is feeling some better.

## 2020-06-10 NOTE — Nurses Notes (Signed)
Pt up to bsc, loose watery stool, pt cleaned and back to bed

## 2020-06-10 NOTE — Progress Notes (Signed)
[facility]  Hospitalist  Progress Note    Date of Service:  06/10/2020  [name], [name], 74 y.o. female  Date of Admission:  06/09/2020  Date of Birth:  1946-05-12  PCP: Myrene Buddy, MD    HPI:    Patient was admitted to the hospital for urinary tract infection and elevated troponin.  Her troponin has trended down.  She is feeling better today.  Patient is tolerating Zyvox and Rocephin well.  Patient has no new complaints.     acetaminophen (TYLENOL) tablet, 650 mg, Oral, Q4H PRN  oxyCODONE (ROXICODONE) immediate release tablet, 10 mg, Oral, Q4H PRN   And  acetaminophen (TYLENOL) tablet, 325 mg, Oral, Q4H PRN  aluminum-magnesium hydroxide-simethicone (MAG-AL PLUS) 200-200-20 mg per 5 mL oral liquid, 10 mL, Oral, Q4H PRN  aspirin (ECOTRIN) enteric coated tablet 81 mg, 81 mg, Oral, Daily  atorvastatin (LIPITOR) tablet, 10 mg, Oral, QPM  cefTRIAXone (ROCEPHIN) 1 g in NS 100 mL IVPB minibag, 1 g, Intravenous, Q24H  dextrose 50% (0.5 g/mL) injection - syringe, 12.5 g, Intravenous, Q15 Min PRN  dilTIAZem (CARDIZEM) tablet, 60 mg, Oral, 2x/day  enoxaparin PF (LOVENOX) 30 mg/0.3 mL SubQ injection, 30 mg, Subcutaneous, Q24H  furosemide (LASIX) tablet, 120 mg, Oral, Daily  heparin flush PF (HEPLOCK) 100 units/mL injection, 5 mL, Intracatheter, Q8H PRN  hydrALAZINE (APRESOLINE) tablet, 25 mg, Oral, 2x/day  insulin glargine 100 units/mL injection, 10 Units, Subcutaneous, QPM  linezolid (ZYVOX) 600 mg in iso-osmotic 300 mL premix IVPB, 600 mg, Intravenous, Q12H  loratadine (CLARITIN) tablet, 10 mg, Oral, Daily  LORazepam (ATIVAN) tablet, 0.5 mg, Oral, QPM  losartan (COZAAR) tablet, 100 mg, Oral, Daily  magnesium hydroxide (MILK OF MAGNESIA) 400mg  per 72mL oral liquid, 15 mL, Oral, Daily PRN  metoprolol succinate (TOPROL-XL) 24 hr extended release tablet, 50 mg, Oral, Daily  nitroGLYCERIN (NITROSTAT) sublingual tablet, 0.4 mg, Sublingual, Q5 Min PRN  NS flush syringe, 10 mL, Intracatheter, Q8HRS  NS flush  syringe, 10 mL, Intracatheter, Q1H PRN  NS premix infusion, , Intravenous, Continuous  ondansetron (ZOFRAN) 2 mg/mL injection, 4 mg, Intravenous, Q6H PRN  pancreatic enzyme replacement (CREON) 6,000 units lipase per cap, 4 Capsule, Oral, 3x/day-Meals  pantoprazole (PROTONIX) delayed release tablet, 40 mg, Oral, Daily  polyethylene glycol (MIRALAX) oral packet, 17 g, Oral, Daily PRN  sodium chloride tablet, 1 g, Oral, 3x/day-Meals  SSIP insulin lispro (HUMALOG) 100 units/mL injection, 0-9 Units, Subcutaneous, 4x/day AC  tiotropium bromide (SPIRIVA RESPIMAT) 2.5 mcg per inhalation oral inhaler - "Respiratory to administer", 2 Puff, Inhalation, Daily         Allergies   Allergen Reactions   . Sulfa (Sulfonamides) Itching             Filed Vitals:    06/10/20 0750 06/10/20 0951 06/10/20 1150 06/10/20 1522   BP:   (!) 167/53    Pulse:   63    Resp:   16 17   Temp:   36.7 C (98 F)    SpO2: 95% 96%         Physical Exam  Constitutional:       General: She is awake. She is not in acute distress.     Appearance: Normal appearance. She is well-developed and normal weight. She is ill-appearing. She is not toxic-appearing. Diaphoretic: Chronic.   HENT:      Head: Normocephalic and atraumatic.   Eyes:      Conjunctiva/sclera: Conjunctivae normal.      Pupils: Pupils are  equal, round, and reactive to light.   Cardiovascular:      Rate and Rhythm: Normal rate. Rhythm regularly irregular.      Heart sounds: Normal heart sounds. No murmur heard.   No friction rub. No gallop.    Pulmonary:      Effort: No respiratory distress.      Breath sounds: Normal breath sounds and air entry. No wheezing or rales.   Abdominal:      General: Bowel sounds are normal. There is no distension.      Palpations: Abdomen is soft. There is no mass.      Tenderness: There is no abdominal tenderness. There is no guarding or rebound.   Musculoskeletal:         General: No deformity. Normal range of motion.      Cervical back: Normal range of motion and  neck supple.   Skin:     General: Skin is warm.      Findings: No erythema or rash.   Neurological:      Mental Status: She is alert and oriented to person, place, and time. Mental status is at baseline.      Cranial Nerves: No cranial nerve deficit.   Psychiatric:         Mood and Affect: Affect normal.         Behavior: Behavior is uncooperative.         Cognition and Memory: Cognition and memory normal.         Laboratory Data:     Results for orders placed or performed during the hospital encounter of 06/09/20 (from the past 24 hour(s))   COVID-19 - SCREENING - Admission (NON-PUI)   Result Value Ref Range    SARS-CoV-2 Not Detected Not Detected   POC BLOOD GLUCOSE (RESULTS)   Result Value Ref Range    GLUCOSE, POC 91 60 - 110 mg/dl   TROPONIN-I   Result Value Ref Range    TROPONIN I 141 (H) 7 - 30 ng/L   POC BLOOD GLUCOSE (RESULTS)   Result Value Ref Range    GLUCOSE, POC 228 (H) 60 - 110 mg/dl   COMPREHENSIVE METABOLIC PANEL, NON-FASTING   Result Value Ref Range    SODIUM 129 (L) 136 - 145 mmol/L    POTASSIUM 3.0 (L) 3.5 - 5.1 mmol/L    CHLORIDE 101 96 - 111 mmol/L    CO2 TOTAL 22 (L) 23 - 31 mmol/L    ANION GAP 6 4 - 13 mmol/L    BUN 19 8 - 25 mg/dL    CREATININE 0.85 0.60 - 1.05 mg/dL    BUN/CREA RATIO 22 6 - 22    ESTIMATED GFR 69 >=60 mL/min/BSA    ALBUMIN 1.9 (L) 3.4 - 4.8 g/dL     CALCIUM 6.7 (L) 8.8 - 10.2 mg/dL    GLUCOSE 174 (H) 65 - 125 mg/dL    ALKALINE PHOSPHATASE 256 (H) 55 - 145 U/L    ALT (SGPT) 155 (H) 8 - 22 U/L    AST (SGOT)  201 (H) 8 - 45 U/L    BILIRUBIN TOTAL 0.3 0.3 - 1.3 mg/dL    PROTEIN TOTAL 3.8 (L) 6.0 - 8.0 g/dL   TROPONIN-I   Result Value Ref Range    TROPONIN I 117 (H) 7 - 30 ng/L   CBC WITH DIFF   Result Value Ref Range    WBC 8.7 3.7 - 11.0 x10^3/uL    RBC 2.95 (L) 3.85 -  5.22 x10^6/uL    HGB 8.2 (L) 11.5 - 16.0 g/dL    HCT 25.4 (L) 34.8 - 46.0 %    MCV 86.1 78.0 - 100.0 fL    MCH 27.8 26.0 - 32.0 pg    MCHC 32.3 31.0 - 35.5 g/dL    RDW-CV 14.3 11.5 - 15.5 %    PLATELETS 49 (L)  150 - 400 x10^3/uL    MPV 12.4 8.7 - 12.5 fL    NEUTROPHIL % 87 %    LYMPHOCYTE % 7 %    MONOCYTE % 5 %    EOSINOPHIL % 0 %    BASOPHIL % 0 %    NEUTROPHIL # 7.63 1.50 - 7.70 x10^3/uL    LYMPHOCYTE # 0.59 (L) 1.00 - 4.80 x10^3/uL    MONOCYTE # 0.43 0.20 - 1.10 x10^3/uL    EOSINOPHIL # <0.10 <=0.50 x10^3/uL    BASOPHIL # <0.10 <=0.20 x10^3/uL    IMMATURE GRANULOCYTE % 1 0 - 1 %    IMMATURE GRANULOCYTE # <0.10 <0.10 x10^3/uL   MORPHOLOGY   Result Value Ref Range    RBC MORPHOLOGY Normal RBC and PLT Morphology    POC BLOOD GLUCOSE (RESULTS)   Result Value Ref Range    GLUCOSE, POC 158 (H) 60 - 110 mg/dl   POC BLOOD GLUCOSE (RESULTS)   Result Value Ref Range    GLUCOSE, POC 333 (H) 60 - 110 mg/dl   TROPONIN-I   Result Value Ref Range    TROPONIN I 79 (H) 7 - 30 ng/L       Imaging Studies:    CT ABDOMEN PELVIS W IV CONTRAST   Final Result by Edi, Radresults In (06/29 1348)   There is no intrahepatic ductal dilatation. Liver mass is not   seen. There is some ascites related to the liver.      There is also ascites related to the spleen and the spleen is of normal   size.      Increase in fecal burden consistent with constipation.      Large left low-lying ventral hernia. Bowel loops in it but the hiatus is   wide.      There does not appear to be major compromise of the intestinal loops. A   significant mechanical obstruction is not seen but there is mild prominence   of small bowel loops with excess fluid. Probably an enteritis pattern. Very   low-grade small bowel obstruction not entirely excluded.         The CT exam was performed using one or more the following a dose reduction   techniques: Automated exposure control, adjustment of the mA and/or kV   according to the patient's size, or use of iterative reconstruction   technique.      This note was partially created using voice recognition software and is   inherently subject to errors including those of syntax and "sound alike "   substitutions which may escape  proof reading.  In such instances, original   meaning may be extrapolated by contextual derivation.         Radiologist location ID: [id number]         XR AP MOBILE CHEST   Final Result by Edi, Radresults In (06/29 1754)   Lung marking prominence. Differential may relate to technical   factors or viral interstitial pneumonia or early cardiac decompensation.      This note was partially created using voice recognition software and is  inherently subject to errors including those of syntax and "sound alike "   substitutions which may escape proof reading.  In such instances, original   meaning may be extrapolated by contextual derivation.         Radiologist location ID: [id number]         [place] RT UPPER QUADRANT   Final Result by Edi, Radresults In (06/29 1459)   There is ascites related to the liver.      This note was partially created using voice recognition software and is   inherently subject to errors including those of syntax and "sound alike "   substitutions which may escape proof reading.  In such instances, original   meaning may be extrapolated by contextual derivation.         Radiologist location ID: [id number]         CT BRAIN WO IV CONTRAST   Final Result by Edi, Radresults In (06/29 1123)   No change from prior. No new active intracranial pathology.      There are some age appropriate appearances.         The CT exam was performed using one or more the following a dose reduction   techniques: Automated exposure control, adjustment of the mA and/or kV   according to the patient's size, or use of iterative reconstruction   technique.      This note was partially created using voice recognition software and is   inherently subject to errors including those of syntax and "sound alike "   substitutions which may escape proof reading.  In such instances, original   meaning may be extrapolated by contextual derivation.         Radiologist location ID: [id number]             Assessment/Plan:  Hospital Problems    UTI  (urinary tract infection)         Date Noted: 06/09/2020      Cirrhosis of liver with ascites (CMS [facility])         Date Noted: 02/11/2020      Longstanding persistent atrial fibrillation (CMS [facility])         Date Noted: 02/11/2020      CHF (congestive heart failure) (CMS [facility])         Date Noted: 04/13/2019      COPD (chronic obstructive pulmonary disease) (CMS [facility])         Date Noted: 04/13/2019      Essential hypertension         Date Noted: 04/13/2019      Hyperlipidemia         Date Noted: 04/13/2019      Resolved Hospital Problems  No resolved problems to display.      . Urinary tract infection.  Continue Rocephin and Zyvox..  Awaiting urine culture.  Continue normal saline.  . Cirrhosis of the liver.  Chronic and stable.  . Atrial fibrillation.  Continue Cardizem and metoprolol.  . COPD.  Spiriva.  . Hypertension.  Continue on occasions.  . Hyperlipidemia.  Lipitor.  . DVT prophylaxis.  Lovenox.    Bethanie Dicker, MD

## 2020-06-10 NOTE — Care Management Notes (Signed)
06/10/20 1602   Assessment Details   Assessment Type Admission   Date of Care Management Update 06/10/20   Readmission   Is this a readmission? Yes   Number of days between last admission and this admission? 67   Were your symptoms the same as before? no   Did your support systems work?  Yes   Did you call your PCP/Home [place] Provider  No   If you had d/c resources set up proir to discharge what were they, and were you seen by them? Yes   D/C resources set up prior to discharge [facility] home [facility]   Did you have any barriers for a succesful discharge? Cost of meds, food, transportation No   Care Management Plan   Discharge Planning Status initial meeting   Projected Discharge Date 06/11/20   Discharge plan discussed with: Patient   CM will evaluate for rehabilitation potential no   Facility or Agency Preferences Has [facility] home health   Discharge Needs Assessment   Outpatient/Agency/Support Group Needs homecare agency   Was referral sent to APS? No   Equipment Currently Used at Home walker, front wheeled;shower chair   Equipment Needed After Discharge none   Discharge Facility/Level of Care Needs Home with Home Health (code 6)   Transportation Available car;family or friend will provide   Referral Information   Admission Type observation   Address Verified verified-no changes   Arrived From home healthcare service   [place] Verified verified-no change   Observation Form   Observation Form Given MOON form given   MOON form explained/reviewed with:  Patient   MOON form date of delivery 06/09/20   MOON form time of delivery 1614   ADVANCE DIRECTIVES   Does the Patient have an Advance Directive? Yes, Patient Does Have Advance Directive for Healthcare Treatment   Type of Advance Directive Completed Medical Living Will;Medical Power of Attorney   Copy of Advance Directives in Chart? 0   Name of Bertrand and Deetta Perla   Phone Number of [place] or Healthcare Surrogate [phone] and [phone]   Patient Requests Assistance in Having Advance Directive Notarized. N/A   LAY CAREGIVER    Appointed Lay Caregiver? I Decline   Employment/Financial   Patient has Prescription Coverage?  Yes        Name of Insurance Coverage for Medications [employer]   Financial Concerns none   Living Environment   Select an age group to open "lives with" row.  Adult   Lives With child(ren), adult   Living Arrangements mobile home   Able to Return to Prior Arrangements yes   [place] Accessibility bed and bath on same level;stairs to enter home   Legal Issues   Do you have a court appointed guardian/conservator? No   Patient Hand-Off   Clinical/Discharge Plan of Care Information Communicated to:  Clinical Care Coordinator   Home Main Entrance   Number of Stairs, Main Entrance six

## 2020-06-11 ENCOUNTER — Observation Stay (HOSPITAL_COMMUNITY): Payer: Medicare Other

## 2020-06-11 LAB — CBC WITH DIFF
BASOPHIL #: <0.1 10*3/uL (ref <=0.20)
BASOPHIL %: 1 %
EOSINOPHIL #: <0.1 10*3/uL (ref <=0.50)
EOSINOPHIL %: 1 %
HCT: 27.7 % — ABNORMAL LOW (ref 34.8–46.0)
HGB: 8.9 g/dL — ABNORMAL LOW (ref 11.5–16.0)
IMMATURE GRANULOCYTE #: <0.1 10*3/uL (ref <0.10)
IMMATURE GRANULOCYTE %: 1 % (ref 0–1)
LYMPHOCYTE #: 0.62 10*3/uL — ABNORMAL LOW (ref 1.00–4.80)
LYMPHOCYTE %: 10 %
MCH: 27.6 pg (ref 26.0–32.0)
MCHC: 32.1 g/dL (ref 31.0–35.5)
MCV: 86 fL (ref 78.0–100.0)
MONOCYTE #: 0.44 10*3/uL (ref 0.20–1.10)
MONOCYTE %: 7 %
NEUTROPHIL #: 5.13 10*3/uL (ref 1.50–7.70)
NEUTROPHIL %: 80 %
PLATELETS: 53 10*3/uL — ABNORMAL LOW (ref 150–400)
RBC: 3.22 10*6/uL — ABNORMAL LOW (ref 3.85–5.22)
RDW-CV: 14.1 % (ref 11.5–15.5)
WBC: 6.3 10*3/uL (ref 3.7–11.0)

## 2020-06-11 LAB — COMPREHENSIVE METABOLIC PANEL, NON-FASTING
ALBUMIN: 2.2 g/dL — ABNORMAL LOW (ref 3.4–4.8)
ALKALINE PHOSPHATASE: 212 U/L — ABNORMAL HIGH (ref 55–145)
ALT (SGPT): 96 U/L — ABNORMAL HIGH (ref 8–22)
ANION GAP: 6 mmol/L (ref 4–13)
AST (SGOT): 66 U/L — ABNORMAL HIGH (ref 8–45)
BILIRUBIN TOTAL: 0.3 mg/dL (ref 0.3–1.3)
BUN/CREA RATIO: 14 (ref 6–22)
BUN: 18 mg/dL (ref 8–25)
CALCIUM: 7.8 mg/dL — ABNORMAL LOW (ref 8.8–10.2)
CHLORIDE: 97 mmol/L (ref 96–111)
CO2 TOTAL: 22 mmol/L — ABNORMAL LOW (ref 23–31)
CREATININE: 1.32 mg/dL — ABNORMAL HIGH (ref 0.60–1.05)
ESTIMATED GFR: 40 mL/min/BSA — ABNORMAL LOW (ref >=60)
GLUCOSE: 209 mg/dL — ABNORMAL HIGH (ref 65–125)
POTASSIUM: 3.2 mmol/L — ABNORMAL LOW (ref 3.5–5.1)
PROTEIN TOTAL: 4.3 g/dL — ABNORMAL LOW (ref 6.0–8.0)
SODIUM: 125 mmol/L — ABNORMAL LOW (ref 136–145)

## 2020-06-11 LAB — POC BLOOD GLUCOSE (RESULTS)
GLUCOSE, POC: 216 mg/dl — ABNORMAL HIGH (ref 60–110)
GLUCOSE, POC: 236 mg/dl — ABNORMAL HIGH (ref 60–110)
GLUCOSE, POC: 229 mg/dl — ABNORMAL HIGH (ref 60–110)
GLUCOSE, POC: 212 mg/dl — ABNORMAL HIGH (ref 60–110)

## 2020-06-11 NOTE — Care Plan (Signed)
Problem: UTI (Urinary Tract Infection)  Goal: Improved Infection Symptoms  Outcome: Ongoing (see interventions/notes)  Intervention: Prevent Infection Progression  Flowsheets (Taken 06/11/2020 0439)  Fever Reduction/Comfort Measures:   lightweight clothing   lightweight bedding     Problem: Pain Acute  Goal: Acceptable Pain Control and Functional Ability  Outcome: Ongoing (see interventions/notes)  Intervention: Develop Pain Management Plan  Flowsheets (Taken 06/11/2020 0439)  Pain Management Interventions:   pain management plan reviewed with patient/caregiver   position adjusted  Intervention: Prevent or Manage Pain  Flowsheets (Taken 06/11/2020 0439)  Sleep/Rest Enhancement:   awakenings minimized   regular sleep/rest pattern promoted  Medication Review/Management: medications reviewed     Problem: Fall Injury Risk  Goal: Absence of Fall and Fall-Related Injury  Outcome: Ongoing (see interventions/notes)  Intervention: Identify and Manage Contributors  Flowsheets (Taken 06/11/2020 0439)  Self-Care Promotion:   independence encouraged   BADL personal objects within reach   safe use of adaptive equipment encouraged  Medication Review/Management: medications reviewed  Intervention: Promote Injury-Free Environment  Flowsheets (Taken 06/11/2020 0439)  Safety Promotion/Fall Prevention:   activity supervised   fall prevention program maintained   nonskid shoes/slippers when out of bed   safety round/check completed

## 2020-06-11 NOTE — Progress Notes (Signed)
[facility]  Hospitalist  Progress Note    Date of Service:  06/11/2020  [name], [name], 74 y.o. female  Date of Admission:  06/09/2020  Date of Birth:  [birth year]  PCP: Myrene Buddy, MD    HPI:    Patient admitted to the hospital for urinary tract infection and elevated troponin.  Troponin is trending down.  Patient is feeling much better today.  She is much more alert.  The patient states she feels significantly better.  She is tolerating Rocephin and Zyvox well.  No new complaints.    acetaminophen (TYLENOL) tablet, 650 mg, Oral, Q4H PRN  oxyCODONE (ROXICODONE) immediate release tablet, 10 mg, Oral, Q4H PRN   And  acetaminophen (TYLENOL) tablet, 325 mg, Oral, Q4H PRN  aluminum-magnesium hydroxide-simethicone (MAG-AL PLUS) 200-200-20 mg per 5 mL oral liquid, 10 mL, Oral, Q4H PRN  aspirin (ECOTRIN) enteric coated tablet 81 mg, 81 mg, Oral, Daily  atorvastatin (LIPITOR) tablet, 10 mg, Oral, QPM  cefTRIAXone (ROCEPHIN) 1 g in NS 100 mL IVPB minibag, 1 g, Intravenous, Q24H  dextrose 50% (0.5 g/mL) injection - syringe, 12.5 g, Intravenous, Q15 Min PRN  dilTIAZem (CARDIZEM) tablet, 60 mg, Oral, 2x/day  enoxaparin PF (LOVENOX) 30 mg/0.3 mL SubQ injection, 30 mg, Subcutaneous, Q24H  furosemide (LASIX) tablet, 120 mg, Oral, Daily  heparin flush PF (HEPLOCK) 100 units/mL injection, 5 mL, Intracatheter, Q8H PRN  hydrALAZINE (APRESOLINE) tablet, 25 mg, Oral, 2x/day  insulin glargine 100 units/mL injection, 10 Units, Subcutaneous, QPM  linezolid (ZYVOX) 600 mg in iso-osmotic 300 mL premix IVPB, 600 mg, Intravenous, Q12H  loratadine (CLARITIN) tablet, 10 mg, Oral, Daily  LORazepam (ATIVAN) tablet, 0.5 mg, Oral, QPM  losartan (COZAAR) tablet, 100 mg, Oral, Daily  magnesium hydroxide (MILK OF MAGNESIA) 400mg  per 65mL oral liquid, 15 mL, Oral, Daily PRN  metoprolol succinate (TOPROL-XL) 24 hr extended release tablet, 50 mg, Oral, Daily  nitroGLYCERIN (NITROSTAT) sublingual tablet, 0.4 mg, Sublingual, Q5 Min  PRN  NS flush syringe, 10 mL, Intracatheter, Q8HRS  NS flush syringe, 10 mL, Intracatheter, Q1H PRN  NS premix infusion, , Intravenous, Continuous  ondansetron (ZOFRAN) 2 mg/mL injection, 4 mg, Intravenous, Q6H PRN  pancreatic enzyme replacement (CREON) 6,000 units lipase per cap, 4 Capsule, Oral, 3x/day-Meals  pantoprazole (PROTONIX) delayed release tablet, 40 mg, Oral, Daily  polyethylene glycol (MIRALAX) oral packet, 17 g, Oral, Daily PRN  sodium chloride tablet, 1 g, Oral, 3x/day-Meals  SSIP insulin lispro (HUMALOG) 100 units/mL injection, 0-9 Units, Subcutaneous, 4x/day AC  tiotropium bromide (SPIRIVA RESPIMAT) 2.5 mcg per inhalation oral inhaler - "Respiratory to administer", 2 Puff, Inhalation, Daily         Allergies   Allergen Reactions   . Sulfa (Sulfonamides) Itching             Filed Vitals:    06/11/20 0900 06/11/20 0947 06/11/20 1150 06/11/20 1550   BP:   (!) 144/61 (!) 174/66   Pulse:   65 61   Resp:   18 18   Temp:   36.6 C (97.8 F) 36.7 C (98.1 F)   SpO2: 98% 99%         Physical Exam  Constitutional:       General: She is awake. She is not in acute distress.     Appearance: Normal appearance. She is well-developed and normal weight. She is ill-appearing. She is not toxic-appearing.   HENT:      Head: Normocephalic and atraumatic.   Eyes:  Conjunctiva/sclera: Conjunctivae normal.      Pupils: Pupils are equal, round, and reactive to light.   Cardiovascular:      Rate and Rhythm: Normal rate. Rhythm irregularly irregular.      Heart sounds: Normal heart sounds. No murmur heard.   No friction rub. No gallop.    Pulmonary:      Effort: No respiratory distress.      Breath sounds: Normal breath sounds and air entry. No wheezing or rales.   Abdominal:      General: Bowel sounds are normal. There is no distension.      Palpations: Abdomen is soft. There is no mass.      Tenderness: There is no abdominal tenderness. There is no guarding or rebound.   Musculoskeletal:         General: No deformity.  Normal range of motion.      Cervical back: Normal range of motion and neck supple.   Skin:     General: Skin is warm.      Findings: No erythema or rash.   Neurological:      Mental Status: She is alert and oriented to person, place, and time. Mental status is at baseline.      Cranial Nerves: No cranial nerve deficit.   Psychiatric:         Mood and Affect: Affect normal.         Behavior: Behavior is cooperative.         Cognition and Memory: Cognition and memory normal.         Laboratory Data:     Results for orders placed or performed during the hospital encounter of 06/09/20 (from the past 24 hour(s))   POC BLOOD GLUCOSE (RESULTS)   Result Value Ref Range    GLUCOSE, POC 342 (H) 60 - 110 mg/dl   POC BLOOD GLUCOSE (RESULTS)   Result Value Ref Range    GLUCOSE, POC 340 (H) 60 - 110 mg/dl   COMPREHENSIVE METABOLIC PANEL, NON-FASTING   Result Value Ref Range    SODIUM 125 (L) 136 - 145 mmol/L    POTASSIUM 3.2 (L) 3.5 - 5.1 mmol/L    CHLORIDE 97 96 - 111 mmol/L    CO2 TOTAL 22 (L) 23 - 31 mmol/L    ANION GAP 6 4 - 13 mmol/L    BUN 18 8 - 25 mg/dL    CREATININE 1.32 (H) 0.60 - 1.05 mg/dL    BUN/CREA RATIO 14 6 - 22    ESTIMATED GFR 40 (L) >=60 mL/min/BSA    ALBUMIN 2.2 (L) 3.4 - 4.8 g/dL     CALCIUM 7.8 (L) 8.8 - 10.2 mg/dL    GLUCOSE 209 (H) 65 - 125 mg/dL    ALKALINE PHOSPHATASE 212 (H) 55 - 145 U/L    ALT (SGPT) 96 (H) 8 - 22 U/L    AST (SGOT)  66 (H) 8 - 45 U/L    BILIRUBIN TOTAL 0.3 0.3 - 1.3 mg/dL    PROTEIN TOTAL 4.3 (L) 6.0 - 8.0 g/dL   CBC WITH DIFF   Result Value Ref Range    WBC 6.3 3.7 - 11.0 x10^3/uL    RBC 3.22 (L) 3.85 - 5.22 x10^6/uL    HGB 8.9 (L) 11.5 - 16.0 g/dL    HCT 27.7 (L) 34.8 - 46.0 %    MCV 86.0 78.0 - 100.0 fL    MCH 27.6 26.0 - 32.0 pg    MCHC 32.1 31.0 -  35.5 g/dL    RDW-CV 14.1 11.5 - 15.5 %    PLATELETS 53 (L) 150 - 400 x10^3/uL    NEUTROPHIL % 80 %    LYMPHOCYTE % 10 %    MONOCYTE % 7 %    EOSINOPHIL % 1 %    BASOPHIL % 1 %    NEUTROPHIL # 5.13 1.50 - 7.70 x10^3/uL    LYMPHOCYTE #  0.62 (L) 1.00 - 4.80 x10^3/uL    MONOCYTE # 0.44 0.20 - 1.10 x10^3/uL    EOSINOPHIL # <0.10 <=0.50 x10^3/uL    BASOPHIL # <0.10 <=0.20 x10^3/uL    IMMATURE GRANULOCYTE % 1 0 - 1 %    IMMATURE GRANULOCYTE # <0.10 <0.10 x10^3/uL   POC BLOOD GLUCOSE (RESULTS)   Result Value Ref Range    GLUCOSE, POC 216 (H) 60 - 110 mg/dl   POC BLOOD GLUCOSE (RESULTS)   Result Value Ref Range    GLUCOSE, POC 236 (H) 60 - 110 mg/dl       Imaging Studies:    XR AP MOBILE CHEST   Final Result by Edi, Radresults In (07/01 0930)   Improvement.      COPD.      Port-A-Cath.      No overt superimposed active cardiac or pulmonary or pleural disease.      This note was partially created using voice recognition software and is   inherently subject to errors including those of syntax and "sound alike "   substitutions which may escape proof reading.  In such instances, original   meaning may be extrapolated by contextual derivation.         Radiologist location ID: [id number]         CT ABDOMEN PELVIS W IV CONTRAST   Final Result by Edi, Radresults In (06/29 1348)   There is no intrahepatic ductal dilatation. Liver mass is not   seen. There is some ascites related to the liver.      There is also ascites related to the spleen and the spleen is of normal   size.      Increase in fecal burden consistent with constipation.      Large left low-lying ventral hernia. Bowel loops in it but the hiatus is   wide.      There does not appear to be major compromise of the intestinal loops. A   significant mechanical obstruction is not seen but there is mild prominence   of small bowel loops with excess fluid. Probably an enteritis pattern. Very   low-grade small bowel obstruction not entirely excluded.         The CT exam was performed using one or more the following a dose reduction   techniques: Automated exposure control, adjustment of the mA and/or kV   according to the patient's size, or use of iterative reconstruction   technique.      This note was  partially created using voice recognition software and is   inherently subject to errors including those of syntax and "sound alike "   substitutions which may escape proof reading.  In such instances, original   meaning may be extrapolated by contextual derivation.         Radiologist location ID: [id number]         XR AP MOBILE CHEST   Final Result by Edi, Radresults In (06/29 1754)   Lung marking prominence. Differential may relate to technical   factors or viral interstitial pneumonia or early  cardiac decompensation.      This note was partially created using voice recognition software and is   inherently subject to errors including those of syntax and "sound alike "   substitutions which may escape proof reading.  In such instances, original   meaning may be extrapolated by contextual derivation.         Radiologist location ID: [id number]         [place] RT UPPER QUADRANT   Final Result by Edi, Radresults In (06/29 1459)   There is ascites related to the liver.      This note was partially created using voice recognition software and is   inherently subject to errors including those of syntax and "sound alike "   substitutions which may escape proof reading.  In such instances, original   meaning may be extrapolated by contextual derivation.         Radiologist location ID: [id number]         CT BRAIN WO IV CONTRAST   Final Result by Edi, Radresults In (06/29 1123)   No change from prior. No new active intracranial pathology.      There are some age appropriate appearances.         The CT exam was performed using one or more the following a dose reduction   techniques: Automated exposure control, adjustment of the mA and/or kV   according to the patient's size, or use of iterative reconstruction   technique.      This note was partially created using voice recognition software and is   inherently subject to errors including those of syntax and "sound alike "   substitutions which may escape proof reading.  In such  instances, original   meaning may be extrapolated by contextual derivation.         Radiologist location ID: [id number]             Assessment/Plan:  Hospital Problems    UTI (urinary tract infection)         Date Noted: 06/09/2020      Cirrhosis of liver with ascites (CMS [place])         Date Noted: 02/11/2020      Longstanding persistent atrial fibrillation (CMS [facility])         Date Noted: 02/11/2020      CHF (congestive heart failure) (CMS [facility])         Date Noted: 04/13/2019      COPD (chronic obstructive pulmonary disease) (CMS [facility])         Date Noted: 04/13/2019      Essential hypertension         Date Noted: 04/13/2019      Hyperlipidemia         Date Noted: 04/13/2019      Resolved Hospital Problems  No resolved problems to display.      . Urinary tract infection.  Continue Rocephin and Zyvox.  Awaiting urine culture.  Continue normal saline.  . Cirrhosis of liver.  Chronic stable.  . Atrial fibrillation.  All new Cardizem and metoprolol.  . COPD.  Spiriva.  . Hypertension.  Continue medications.  . Hyperlipidemia.  Lipitor.  . DVT prophylaxis.  Lovenox.    Bethanie Dicker, MD

## 2020-06-12 LAB — CBC WITH DIFF
BASOPHIL #: <0.1 10*3/uL (ref <=0.20)
BASOPHIL %: 0 %
EOSINOPHIL #: <0.1 10*3/uL (ref <=0.50)
EOSINOPHIL %: 2 %
HCT: 27.1 % — ABNORMAL LOW (ref 34.8–46.0)
HGB: 8.8 g/dL — ABNORMAL LOW (ref 11.5–16.0)
IMMATURE GRANULOCYTE #: <0.1 10*3/uL (ref <0.10)
IMMATURE GRANULOCYTE %: 0 % (ref 0–1)
LYMPHOCYTE #: 0.69 10*3/uL — ABNORMAL LOW (ref 1.00–4.80)
LYMPHOCYTE %: 18 %
MCH: 27.5 pg (ref 26.0–32.0)
MCHC: 32.5 g/dL (ref 31.0–35.5)
MCV: 84.7 fL (ref 78.0–100.0)
MONOCYTE #: 0.4 10*3/uL (ref 0.20–1.10)
MONOCYTE %: 11 %
MPV: 12.4 fL (ref 8.7–12.5)
NEUTROPHIL #: 2.62 10*3/uL (ref 1.50–7.70)
NEUTROPHIL %: 69 %
PLATELETS: 66 10*3/uL — ABNORMAL LOW (ref 150–400)
RBC: 3.2 10*6/uL — ABNORMAL LOW (ref 3.85–5.22)
RDW-CV: 13.9 % (ref 11.5–15.5)
WBC: 3.8 10*3/uL (ref 3.7–11.0)

## 2020-06-12 LAB — ECG 12-LEAD
Atrial Rate: 80 {beats}/min
Calculated P Axis: 66 degrees
Calculated R Axis: -11 degrees
Calculated T Axis: 51 degrees
PR Interval: 218 ms
QRS Duration: 90 ms
QT Interval: 404 ms
QTC Calculation: 465 ms
Ventricular rate: 80 {beats}/min

## 2020-06-12 LAB — COMPREHENSIVE METABOLIC PANEL, NON-FASTING
ALBUMIN: 2.2 g/dL — ABNORMAL LOW (ref 3.4–4.8)
ALKALINE PHOSPHATASE: 168 U/L — ABNORMAL HIGH (ref 55–145)
ALT (SGPT): 59 U/L — ABNORMAL HIGH (ref 8–22)
ANION GAP: 9 mmol/L (ref 4–13)
AST (SGOT): 34 U/L (ref 8–45)
BILIRUBIN TOTAL: 0.3 mg/dL (ref 0.3–1.3)
BUN/CREA RATIO: 12 (ref 6–22)
BUN: 14 mg/dL (ref 8–25)
CALCIUM: 7.9 mg/dL — ABNORMAL LOW (ref 8.8–10.2)
CHLORIDE: 103 mmol/L (ref 96–111)
CO2 TOTAL: 22 mmol/L — ABNORMAL LOW (ref 23–31)
CREATININE: 1.18 mg/dL — ABNORMAL HIGH (ref 0.60–1.05)
ESTIMATED GFR: 46 mL/min/BSA — ABNORMAL LOW (ref >=60)
GLUCOSE: 102 mg/dL (ref 65–125)
POTASSIUM: 3 mmol/L — ABNORMAL LOW (ref 3.5–5.1)
PROTEIN TOTAL: 4.2 g/dL — ABNORMAL LOW (ref 6.0–8.0)
SODIUM: 134 mmol/L — ABNORMAL LOW (ref 136–145)

## 2020-06-12 LAB — POC BLOOD GLUCOSE (RESULTS)
GLUCOSE, POC: 109 mg/dl (ref 60–110)
GLUCOSE, POC: 252 mg/dl — ABNORMAL HIGH (ref 60–110)
GLUCOSE, POC: 192 mg/dl — ABNORMAL HIGH (ref 60–110)
GLUCOSE, POC: 240 mg/dl — ABNORMAL HIGH (ref 60–110)

## 2020-06-12 MED ORDER — PHENYLEPHRINE 1 % NASAL SPRAY
1.00 | NASAL | Status: AC | PRN
Start: 2020-06-12 — End: ?
  Administered 2020-06-12: 1 via NASAL
  Filled 2020-06-12: qty 15

## 2020-06-12 MED ORDER — PROCHLORPERAZINE EDISYLATE 10 MG/2 ML (5 MG/ML) INJECTION SOLUTION
10.00 mg | Freq: Four times a day (QID) | INTRAMUSCULAR | Status: AC | PRN
Start: 2020-06-12 — End: ?
  Administered 2020-06-12: 10 mg via INTRAVENOUS
  Filled 2020-06-12: qty 2

## 2020-06-12 MED ORDER — FENTANYL (PF) 50 MCG/ML INJECTION SOLUTION
50.00 ug | INTRAMUSCULAR | Status: DC | PRN
Start: 2020-06-12 — End: 2020-06-14
  Administered 2020-06-12 – 2020-06-14 (×5): 50 ug via INTRAVENOUS
  Filled 2020-06-12 (×5): qty 2

## 2020-06-12 MED ORDER — NYSTATIN 100,000 UNIT/ML ORAL SUSPENSION
5.00 mL | Freq: Four times a day (QID) | ORAL | Status: DC
Start: 2020-06-12 — End: 2020-06-12

## 2020-06-12 MED ORDER — NYSTATIN 100,000 UNIT/ML ORAL SUSPENSION
5.00 mL | Freq: Four times a day (QID) | ORAL | Status: DC
Start: 2020-06-12 — End: 2020-06-14
  Administered 2020-06-12 – 2020-06-14 (×6): 5 mL via ORAL
  Filled 2020-06-12 (×6): qty 5

## 2020-06-12 NOTE — Respiratory Therapy (Signed)
Spiriva was given on this shift, 02 sat 96% on room air.

## 2020-06-12 NOTE — Progress Notes (Signed)
[facility]  Hospitalist  Progress Note    Date of Service:  06/12/2020  [name], [name], 74 y.o. female  Date of Admission:  06/09/2020  Date of Birth:  July 20, 1946  PCP: Myrene Buddy, MD    HPI:  Patient admitted our service for UTI.  Her urine culture is negative thus far.  Patient is getting Rocephin and Zyvox.  This morning she complains of a severe frontal headache.  She says she was given some oxycodone and feels some better.  She has continued generalized weakness as well.    acetaminophen (TYLENOL) tablet, 650 mg, Oral, Q4H PRN  oxyCODONE (ROXICODONE) immediate release tablet, 10 mg, Oral, Q4H PRN   And  acetaminophen (TYLENOL) tablet, 325 mg, Oral, Q4H PRN  aluminum-magnesium hydroxide-simethicone (MAG-AL PLUS) 200-200-20 mg per 5 mL oral liquid, 10 mL, Oral, Q4H PRN  aspirin (ECOTRIN) enteric coated tablet 81 mg, 81 mg, Oral, Daily  atorvastatin (LIPITOR) tablet, 10 mg, Oral, QPM  cefTRIAXone (ROCEPHIN) 1 g in NS 100 mL IVPB minibag, 1 g, Intravenous, Q24H  dextrose 50% (0.5 g/mL) injection - syringe, 12.5 g, Intravenous, Q15 Min PRN  dilTIAZem (CARDIZEM) tablet, 60 mg, Oral, 2x/day  enoxaparin PF (LOVENOX) 30 mg/0.3 mL SubQ injection, 30 mg, Subcutaneous, Q24H  fentaNYL (SUBLIMAZE) 50 mcg/mL injection, 50 mcg, Intravenous, Q2H PRN  furosemide (LASIX) tablet, 120 mg, Oral, Daily  heparin flush PF (HEPLOCK) 100 units/mL injection, 5 mL, Intracatheter, Q8H PRN  hydrALAZINE (APRESOLINE) tablet, 25 mg, Oral, 2x/day  insulin glargine 100 units/mL injection, 10 Units, Subcutaneous, QPM  linezolid (ZYVOX) 600 mg in iso-osmotic 300 mL premix IVPB, 600 mg, Intravenous, Q12H  loratadine (CLARITIN) tablet, 10 mg, Oral, Daily  LORazepam (ATIVAN) tablet, 0.5 mg, Oral, QPM  losartan (COZAAR) tablet, 100 mg, Oral, Daily  magnesium hydroxide (MILK OF MAGNESIA) 400mg  per 74mL oral liquid, 15 mL, Oral, Daily PRN  metoprolol succinate (TOPROL-XL) 24 hr extended release tablet, 50 mg, Oral, Daily   nitroGLYCERIN (NITROSTAT) sublingual tablet, 0.4 mg, Sublingual, Q5 Min PRN  NS flush syringe, 10 mL, Intracatheter, Q8HRS  NS flush syringe, 10 mL, Intracatheter, Q1H PRN  NS premix infusion, , Intravenous, Continuous  ondansetron (ZOFRAN) 2 mg/mL injection, 4 mg, Intravenous, Q6H PRN  pancreatic enzyme replacement (CREON) 6,000 units lipase per cap, 4 Capsule, Oral, 3x/day-Meals  pantoprazole (PROTONIX) delayed release tablet, 40 mg, Oral, Daily  polyethylene glycol (MIRALAX) oral packet, 17 g, Oral, Daily PRN  sodium chloride tablet, 1 g, Oral, 3x/day-Meals  SSIP insulin lispro (HUMALOG) 100 units/mL injection, 0-9 Units, Subcutaneous, 4x/day AC  tiotropium bromide (SPIRIVA RESPIMAT) 2.5 mcg per inhalation oral inhaler - "Respiratory to administer", 2 Puff, Inhalation, Daily         Allergies   Allergen Reactions    Sulfa (Sulfonamides) Itching             Filed Vitals:    06/12/20 0340 06/12/20 0700 06/12/20 0800 06/12/20 0914   BP: (!) 140/80 (!) 142/76     Pulse: 66 64     Resp: 18 17     Temp: 37 C (98.6 F) 36.9 C (98.5 F)     SpO2:   96% 97%       Physical Exam  Constitutional:       Comments: Chronically ill-appearing female resting in bed.  She does not appear to be in acute distress   HENT:      Head: Normocephalic and atraumatic.      Right Ear: External ear normal.  Left Ear: External ear normal.      Nose: Nose normal.   Eyes:      Pupils: Pupils are equal, round, and reactive to light.   Cardiovascular:      Rate and Rhythm: Normal rate and regular rhythm.      Heart sounds: Murmur heard.     Pulmonary:      Breath sounds: Normal breath sounds.   Abdominal:      General: Bowel sounds are normal. There is no distension.      Palpations: Abdomen is soft.      Tenderness: There is no abdominal tenderness.   Musculoskeletal:         General: No tenderness or deformity. Normal range of motion.      Cervical back: Normal range of motion and neck supple.   Skin:     General: Skin is warm and dry.       Findings: No rash.   Neurological:      Mental Status: She is alert and oriented to person, place, and time.      Cranial Nerves: No cranial nerve deficit.      Deep Tendon Reflexes: Reflexes are normal and symmetric.   Psychiatric:         Mood and Affect: Affect normal.         Cognition and Memory: Memory normal.         Judgment: Judgment normal.          Laboratory Data:     Results for orders placed or performed during the hospital encounter of 06/09/20 (from the past 24 hour(s))   POC BLOOD GLUCOSE (RESULTS)   Result Value Ref Range    GLUCOSE, POC 236 (H) 60 - 110 mg/dl   POC BLOOD GLUCOSE (RESULTS)   Result Value Ref Range    GLUCOSE, POC 229 (H) 60 - 110 mg/dl   POC BLOOD GLUCOSE (RESULTS)   Result Value Ref Range    GLUCOSE, POC 212 (H) 60 - 110 mg/dl   POC BLOOD GLUCOSE (RESULTS)   Result Value Ref Range    GLUCOSE, POC 109 60 - 110 mg/dl   COMPREHENSIVE METABOLIC PANEL, NON-FASTING   Result Value Ref Range    SODIUM 134 (L) 136 - 145 mmol/L    POTASSIUM 3.0 (L) 3.5 - 5.1 mmol/L    CHLORIDE 103 96 - 111 mmol/L    CO2 TOTAL 22 (L) 23 - 31 mmol/L    ANION GAP 9 4 - 13 mmol/L    BUN 14 8 - 25 mg/dL    CREATININE 1.18 (H) 0.60 - 1.05 mg/dL    BUN/CREA RATIO 12 6 - 22    ESTIMATED GFR 46 (L) >=60 mL/min/BSA    ALBUMIN 2.2 (L) 3.4 - 4.8 g/dL     CALCIUM 7.9 (L) 8.8 - 10.2 mg/dL    GLUCOSE 102 65 - 125 mg/dL    ALKALINE PHOSPHATASE 168 (H) 55 - 145 U/L    ALT (SGPT) 59 (H) 8 - 22 U/L    AST (SGOT)  34 8 - 45 U/L    BILIRUBIN TOTAL 0.3 0.3 - 1.3 mg/dL    PROTEIN TOTAL 4.2 (L) 6.0 - 8.0 g/dL   CBC WITH DIFF   Result Value Ref Range    WBC 3.8 3.7 - 11.0 x10^3/uL    RBC 3.20 (L) 3.85 - 5.22 x10^6/uL    HGB 8.8 (L) 11.5 - 16.0 g/dL    HCT 27.1 (L) 34.8 -  46.0 %    MCV 84.7 78.0 - 100.0 fL    MCH 27.5 26.0 - 32.0 pg    MCHC 32.5 31.0 - 35.5 g/dL    RDW-CV 13.9 11.5 - 15.5 %    PLATELETS 66 (L) 150 - 400 x10^3/uL    MPV 12.4 8.7 - 12.5 fL    NEUTROPHIL % 69 %    LYMPHOCYTE % 18 %    MONOCYTE % 11 %    EOSINOPHIL % 2  %    BASOPHIL % 0 %    NEUTROPHIL # 2.62 1.50 - 7.70 x10^3/uL    LYMPHOCYTE # 0.69 (L) 1.00 - 4.80 x10^3/uL    MONOCYTE # 0.40 0.20 - 1.10 x10^3/uL    EOSINOPHIL # <0.10 <=0.50 x10^3/uL    BASOPHIL # <0.10 <=0.20 x10^3/uL    IMMATURE GRANULOCYTE % 0 0 - 1 %    IMMATURE GRANULOCYTE # <0.10 <0.10 x10^3/uL       Imaging Studies:    XR AP MOBILE CHEST   Final Result by Edi, Radresults In (07/01 0930)   Improvement.      COPD.      Port-A-Cath.      No overt superimposed active cardiac or pulmonary or pleural disease.      This note was partially created using voice recognition software and is   inherently subject to errors including those of syntax and "sound alike "   substitutions which may escape proof reading.  In such instances, original   meaning may be extrapolated by contextual derivation.         Radiologist location ID: [id number]         CT ABDOMEN PELVIS W IV CONTRAST   Final Result by Edi, Radresults In (06/29 1348)   There is no intrahepatic ductal dilatation. Liver mass is not   seen. There is some ascites related to the liver.      There is also ascites related to the spleen and the spleen is of normal   size.      Increase in fecal burden consistent with constipation.      Large left low-lying ventral hernia. Bowel loops in it but the hiatus is   wide.      There does not appear to be major compromise of the intestinal loops. A   significant mechanical obstruction is not seen but there is mild prominence   of small bowel loops with excess fluid. Probably an enteritis pattern. Very   low-grade small bowel obstruction not entirely excluded.         The CT exam was performed using one or more the following a dose reduction   techniques: Automated exposure control, adjustment of the mA and/or kV   according to the patient's size, or use of iterative reconstruction   technique.      This note was partially created using voice recognition software and is   inherently subject to errors including those of syntax  and "sound alike "   substitutions which may escape proof reading.  In such instances, original   meaning may be extrapolated by contextual derivation.         Radiologist location ID: [id number]         XR AP MOBILE CHEST   Final Result by Edi, Radresults In (06/29 1754)   Lung marking prominence. Differential may relate to technical   factors or viral interstitial pneumonia or early cardiac decompensation.      This note was  partially created using voice recognition software and is   inherently subject to errors including those of syntax and "sound alike "   substitutions which may escape proof reading.  In such instances, original   meaning may be extrapolated by contextual derivation.         Radiologist location ID: [id number]         [place] RT UPPER QUADRANT   Final Result by Edi, Radresults In (06/29 1459)   There is ascites related to the liver.      This note was partially created using voice recognition software and is   inherently subject to errors including those of syntax and "sound alike "   substitutions which may escape proof reading.  In such instances, original   meaning may be extrapolated by contextual derivation.         Radiologist location ID: [id number]         CT BRAIN WO IV CONTRAST   Final Result by Edi, Radresults In (06/29 1123)   No change from prior. No new active intracranial pathology.      There are some age appropriate appearances.         The CT exam was performed using one or more the following a dose reduction   techniques: Automated exposure control, adjustment of the mA and/or kV   according to the patient's size, or use of iterative reconstruction   technique.      This note was partially created using voice recognition software and is   inherently subject to errors including those of syntax and "sound alike "   substitutions which may escape proof reading.  In such instances, original   meaning may be extrapolated by contextual derivation.         Radiologist location ID: [id number]              Assessment/Plan:  Hospital Problems    UTI (urinary tract infection)         Date Noted: 06/09/2020      Cirrhosis of liver with ascites (CMS [place])         Date Noted: 02/11/2020      Longstanding persistent atrial fibrillation (CMS [facility])         Date Noted: 02/11/2020      CHF (congestive heart failure) (CMS [facility])         Date Noted: 04/13/2019      COPD (chronic obstructive pulmonary disease) (CMS [facility])         Date Noted: 04/13/2019      Essential hypertension         Date Noted: 04/13/2019      Hyperlipidemia         Date Noted: 04/13/2019      Resolved Hospital Problems  No resolved problems to display.      1. UTI will continue Rocephin and Zyvox.  Urine cultures negative thus far  2. Cephalgia.  The patient will have p.r.n. fentanyl  3. Cirrhosis is stable     Birdie Hopes, PA-C    I personally saw and examined the patient.  See mid-level note for additional details.  My findings are patient was admitted with urinary tract infection.  Patient is still receiving Rocephin and Zyvox.  Patient is also complaining of some head congestion and stuffy nose.  Will order some Neo-Synephrine nasal spray.  She also has some nausea and a Compazine was added to her regimen.    Maxcine Ham, MD  06/12/2020, 13:08

## 2020-06-12 NOTE — Care Plan (Signed)
Problem: Adult Inpatient Plan of Care  Goal: Plan of Care Review  Outcome: Ongoing (see interventions/notes)  Goal: Patient-Specific Goal (Individualized)  Outcome: Ongoing (see interventions/notes)  Goal: Absence of Hospital-Acquired Illness or Injury  Outcome: Ongoing (see interventions/notes)  Goal: Optimal Comfort and Wellbeing  Outcome: Ongoing (see interventions/notes)  Goal: Rounds/Family Conference  Outcome: Ongoing (see interventions/notes)     Problem: UTI (Urinary Tract Infection)  Goal: Improved Infection Symptoms  Outcome: Ongoing (see interventions/notes)     Problem: Fluid Volume Excess  Goal: Fluid Balance  Outcome: Ongoing (see interventions/notes)     Problem: Pain Acute  Goal: Acceptable Pain Control and Functional Ability  Outcome: Ongoing (see interventions/notes)     Problem: Fall Injury Risk  Goal: Absence of Fall and Fall-Related Injury  Outcome: Ongoing (see interventions/notes)   Assess fluid requirements and deficit to determine goal-directed fluid therapy.  Keep accurate intake, output and daily weight; monitor trends.  Monitor laboratory value trends and need for treatment adjustment.

## 2020-06-13 LAB — COMPREHENSIVE METABOLIC PANEL, NON-FASTING
ALBUMIN: 2.3 g/dL — ABNORMAL LOW (ref 3.4–4.8)
ALKALINE PHOSPHATASE: 175 U/L — ABNORMAL HIGH (ref 55–145)
ALT (SGPT): 48 U/L — ABNORMAL HIGH (ref 8–22)
ANION GAP: 9 mmol/L (ref 4–13)
AST (SGOT): 27 U/L (ref 8–45)
BILIRUBIN TOTAL: 0.2 mg/dL — ABNORMAL LOW (ref 0.3–1.3)
BUN/CREA RATIO: 10 (ref 6–22)
BUN: 12 mg/dL (ref 8–25)
CALCIUM: 8 mg/dL — ABNORMAL LOW (ref 8.8–10.2)
CHLORIDE: 103 mmol/L (ref 96–111)
CO2 TOTAL: 21 mmol/L — ABNORMAL LOW (ref 23–31)
CREATININE: 1.2 mg/dL — ABNORMAL HIGH (ref 0.60–1.05)
ESTIMATED GFR: 45 mL/min/BSA — ABNORMAL LOW (ref >=60)
GLUCOSE: 149 mg/dL — ABNORMAL HIGH (ref 65–125)
POTASSIUM: 3 mmol/L — ABNORMAL LOW (ref 3.5–5.1)
PROTEIN TOTAL: 4.6 g/dL — ABNORMAL LOW (ref 6.0–8.0)
SODIUM: 133 mmol/L — ABNORMAL LOW (ref 136–145)

## 2020-06-13 LAB — CBC WITH DIFF
BASOPHIL #: <0.1 10*3/uL (ref <=0.20)
BASOPHIL %: 2 %
EOSINOPHIL #: <0.1 10*3/uL (ref <=0.50)
EOSINOPHIL %: 2 %
HCT: 31.2 % — ABNORMAL LOW (ref 34.8–46.0)
HGB: 10.1 g/dL — ABNORMAL LOW (ref 11.5–16.0)
IMMATURE GRANULOCYTE #: <0.1 10*3/uL (ref <0.10)
IMMATURE GRANULOCYTE %: 1 % (ref 0–1)
LYMPHOCYTE #: 0.89 10*3/uL — ABNORMAL LOW (ref 1.00–4.80)
LYMPHOCYTE %: 26 %
MCH: 27.9 pg (ref 26.0–32.0)
MCHC: 32.4 g/dL (ref 31.0–35.5)
MCV: 86.2 fL (ref 78.0–100.0)
MONOCYTE #: 0.46 10*3/uL (ref 0.20–1.10)
MONOCYTE %: 13 %
NEUTROPHIL #: 1.96 10*3/uL (ref 1.50–7.70)
NEUTROPHIL %: 56 %
PLATELETS: 72 10*3/uL — ABNORMAL LOW (ref 150–400)
RBC: 3.62 10*6/uL — ABNORMAL LOW (ref 3.85–5.22)
RDW-CV: 14.2 % (ref 11.5–15.5)
WBC: 3.5 10*3/uL — ABNORMAL LOW (ref 3.7–11.0)

## 2020-06-13 LAB — POC BLOOD GLUCOSE (RESULTS)
GLUCOSE, POC: 165 mg/dl — ABNORMAL HIGH (ref 60–110)
GLUCOSE, POC: 233 mg/dl — ABNORMAL HIGH (ref 60–110)
GLUCOSE, POC: 260 mg/dl — ABNORMAL HIGH (ref 60–110)
GLUCOSE, POC: 249 mg/dl — ABNORMAL HIGH (ref 60–110)

## 2020-06-13 LAB — LIGHT GREEN TOP TUBE

## 2020-06-13 NOTE — Care Plan (Signed)
Problem: Adult Inpatient Plan of Care  Goal: Plan of Care Review  Outcome: Ongoing (see interventions/notes)  Goal: Patient-Specific Goal (Individualized)  Outcome: Ongoing (see interventions/notes)  Goal: Absence of Hospital-Acquired Illness or Injury  Outcome: Ongoing (see interventions/notes)  Goal: Optimal Comfort and Wellbeing  Outcome: Ongoing (see interventions/notes)  Goal: Rounds/Family Conference  Outcome: Ongoing (see interventions/notes)     Problem: UTI (Urinary Tract Infection)  Goal: Improved Infection Symptoms  Outcome: Ongoing (see interventions/notes)     Problem: Fluid Volume Excess  Goal: Fluid Balance  Outcome: Ongoing (see interventions/notes)     Problem: Pain Acute  Goal: Acceptable Pain Control and Functional Ability  Outcome: Ongoing (see interventions/notes)     Problem: Fall Injury Risk  Goal: Absence of Fall and Fall-Related Injury  Outcome: Ongoing (see interventions/notes)     Problem: Balance Impairment (Functional Deficit)  Goal: Improved Balance and Postural Control  Outcome: Ongoing (see interventions/notes)     Problem: Muscle Strength Impairment (Functional Deficit)  Goal: Improved Muscle Strength  Outcome: Ongoing (see interventions/notes)   Evaluate balance skills using a validated tool.  Screen for and address underlying impairments, such as muscle weakness, sensorimotor, cognitive and postural control deficits.  Promote participation in regular daily and physical activity to minimize decline associated with inactivity; provide adaptations as needed.

## 2020-06-13 NOTE — Respiratory Therapy (Signed)
Patient has received her Spiriva on this shift, 02 sat 93%  on room air.

## 2020-06-13 NOTE — Care Plan (Signed)
Problem: Pain Acute  Goal: Acceptable Pain Control and Functional Ability  Outcome: Ongoing (see interventions/notes)  Intervention: Develop Pain Management Plan  Flowsheets (Taken 06/13/2020 0411)  Pain Management Interventions:   pain management plan reviewed with patient/caregiver   relaxation techniques promoted  Intervention: Prevent or Manage Pain  Flowsheets (Taken 06/13/2020 0411)  Sleep/Rest Enhancement:   awakenings minimized   regular sleep/rest pattern promoted  Medication Review/Management: medications reviewed     Problem: Fall Injury Risk  Goal: Absence of Fall and Fall-Related Injury  Outcome: Ongoing (see interventions/notes)  Intervention: Identify and Manage Contributors  Flowsheets (Taken 06/13/2020 0411)  Self-Care Promotion:   independence encouraged   BADL personal objects within reach   BADL personal routines maintained   safe use of adaptive equipment encouraged  Medication Review/Management: medications reviewed

## 2020-06-13 NOTE — Progress Notes (Signed)
[facility]  Hospitalist  Progress Note    Date of Service:  06/13/2020  [name], [name], 74 y.o. female  Date of Admission:  06/09/2020  Date of Birth:  10-24-1946  PCP: Myrene Buddy, MD    HPI:  Patient admitted for UTI.  Urine culture is negative.  She has been getting Rocephin.  Patient complains of weakness and lightheadedness and is unsure she can go home.  I feel the patient is cleared from a medical standpoint.  I feel that her weakness and deconditioning is likely secondary to her pancreatic cancer in this is likely her new baseline.    acetaminophen (TYLENOL) tablet, 650 mg, Oral, Q4H PRN  oxyCODONE (ROXICODONE) immediate release tablet, 10 mg, Oral, Q4H PRN   And  acetaminophen (TYLENOL) tablet, 325 mg, Oral, Q4H PRN  aluminum-magnesium hydroxide-simethicone (MAG-AL PLUS) 200-200-20 mg per 5 mL oral liquid, 10 mL, Oral, Q4H PRN  aspirin (ECOTRIN) enteric coated tablet 81 mg, 81 mg, Oral, Daily  atorvastatin (LIPITOR) tablet, 10 mg, Oral, QPM  cefTRIAXone (ROCEPHIN) 1 g in NS 100 mL IVPB minibag, 1 g, Intravenous, Q24H  dextrose 50% (0.5 g/mL) injection - syringe, 12.5 g, Intravenous, Q15 Min PRN  dilTIAZem (CARDIZEM) tablet, 60 mg, Oral, 2x/day  enoxaparin PF (LOVENOX) 30 mg/0.3 mL SubQ injection, 30 mg, Subcutaneous, Q24H  fentaNYL (SUBLIMAZE) 50 mcg/mL injection, 50 mcg, Intravenous, Q2H PRN  furosemide (LASIX) tablet, 120 mg, Oral, Daily  heparin flush PF (HEPLOCK) 100 units/mL injection, 5 mL, Intracatheter, Q8H PRN  hydrALAZINE (APRESOLINE) tablet, 25 mg, Oral, 2x/day  insulin glargine 100 units/mL injection, 10 Units, Subcutaneous, QPM  linezolid (ZYVOX) 600 mg in iso-osmotic 300 mL premix IVPB, 600 mg, Intravenous, Q12H  loratadine (CLARITIN) tablet, 10 mg, Oral, Daily  LORazepam (ATIVAN) tablet, 0.5 mg, Oral, QPM  losartan (COZAAR) tablet, 100 mg, Oral, Daily  magnesium hydroxide (MILK OF MAGNESIA) 400mg  per 81mL oral liquid, 15 mL, Oral, Daily PRN  metoprolol succinate  (TOPROL-XL) 24 hr extended release tablet, 50 mg, Oral, Daily  nitroGLYCERIN (NITROSTAT) sublingual tablet, 0.4 mg, Sublingual, Q5 Min PRN  NS flush syringe, 10 mL, Intracatheter, Q8HRS  NS flush syringe, 10 mL, Intracatheter, Q1H PRN  NS premix infusion, , Intravenous, Continuous  nystatin (MYCOSTATIN) 100,000 units per mL oral liquid, 5 mL, Swish & Swallow, 4x/day  ondansetron (ZOFRAN) 2 mg/mL injection, 4 mg, Intravenous, Q6H PRN  pancreatic enzyme replacement (CREON) 6,000 units lipase per cap, 4 Capsule, Oral, 3x/day-Meals  pantoprazole (PROTONIX) delayed release tablet, 40 mg, Oral, Daily  phenylephrine (NEO-SYNEPHRINE) 1% nasal spray, 1 Spray, Each Nostril, Q4H PRN  polyethylene glycol (MIRALAX) oral packet, 17 g, Oral, Daily PRN  prochlorperazine (COMPAZINE) 5 mg/mL injection, 10 mg, Intravenous, Q6H PRN  sodium chloride tablet, 1 g, Oral, 3x/day-Meals  SSIP insulin lispro (HUMALOG) 100 units/mL injection, 0-9 Units, Subcutaneous, 4x/day AC  tiotropium bromide (SPIRIVA RESPIMAT) 2.5 mcg per inhalation oral inhaler - "Respiratory to administer", 2 Puff, Inhalation, Daily         Allergies   Allergen Reactions    Sulfa (Sulfonamides) Itching             Filed Vitals:    06/13/20 0008 06/13/20 0444 06/13/20 0720 06/13/20 0910   BP: (!) 138/52 (!) 136/54 138/78    Pulse: 82 66 68    Resp: 18 18 16     Temp: 36.4 C (97.6 F) 37 C (98.6 F) 36.7 C (98 F)    SpO2:    98%  Physical Exam  Constitutional:       Comments: Chronically ill but nontoxic appearing female resting in bed.  She is not appear to be in acute distress   HENT:      Head: Normocephalic and atraumatic.      Right Ear: External ear normal.      Left Ear: External ear normal.      Nose: Nose normal.   Eyes:      Pupils: Pupils are equal, round, and reactive to light.   Cardiovascular:      Rate and Rhythm: Normal rate and regular rhythm.      Heart sounds: Normal heart sounds.   Pulmonary:      Breath sounds: Normal breath sounds.    Abdominal:      General: Bowel sounds are normal. There is no distension.      Palpations: Abdomen is soft.      Tenderness: There is no abdominal tenderness.   Musculoskeletal:         General: No tenderness or deformity. Normal range of motion.      Cervical back: Normal range of motion and neck supple.   Skin:     General: Skin is warm and dry.      Findings: No rash.      Comments: Venous stasis changes bilateral lower extremity   Neurological:      Mental Status: She is alert and oriented to person, place, and time.      Cranial Nerves: No cranial nerve deficit.      Deep Tendon Reflexes: Reflexes are normal and symmetric.   Psychiatric:         Mood and Affect: Affect normal.         Cognition and Memory: Memory normal.         Judgment: Judgment normal.          Laboratory Data:     Results for orders placed or performed during the hospital encounter of 06/09/20 (from the past 24 hour(s))   POC BLOOD GLUCOSE (RESULTS)   Result Value Ref Range    GLUCOSE, POC 252 (H) 60 - 110 mg/dl   POC BLOOD GLUCOSE (RESULTS)   Result Value Ref Range    GLUCOSE, POC 192 (H) 60 - 110 mg/dl   POC BLOOD GLUCOSE (RESULTS)   Result Value Ref Range    GLUCOSE, POC 240 (H) 60 - 110 mg/dl   POC BLOOD GLUCOSE (RESULTS)   Result Value Ref Range    GLUCOSE, POC 165 (H) 60 - 110 mg/dl   COMPREHENSIVE METABOLIC PANEL, NON-FASTING   Result Value Ref Range    SODIUM 133 (L) 136 - 145 mmol/L    POTASSIUM 3.0 (L) 3.5 - 5.1 mmol/L    CHLORIDE 103 96 - 111 mmol/L    CO2 TOTAL 21 (L) 23 - 31 mmol/L    ANION GAP 9 4 - 13 mmol/L    BUN 12 8 - 25 mg/dL    CREATININE 1.20 (H) 0.60 - 1.05 mg/dL    BUN/CREA RATIO 10 6 - 22    ESTIMATED GFR 45 (L) >=60 mL/min/BSA    ALBUMIN 2.3 (L) 3.4 - 4.8 g/dL     CALCIUM 8.0 (L) 8.8 - 10.2 mg/dL    GLUCOSE 149 (H) 65 - 125 mg/dL    ALKALINE PHOSPHATASE 175 (H) 55 - 145 U/L    ALT (SGPT) 48 (H) 8 - 22 U/L    AST (SGOT)  27 8 -  45 U/L    BILIRUBIN TOTAL 0.2 (L) 0.3 - 1.3 mg/dL    PROTEIN TOTAL 4.6 (L) 6.0 - 8.0 g/dL    CBC WITH DIFF   Result Value Ref Range    WBC 3.5 (L) 3.7 - 11.0 x10^3/uL    RBC 3.62 (L) 3.85 - 5.22 x10^6/uL    HGB 10.1 (L) 11.5 - 16.0 g/dL    HCT 31.2 (L) 34.8 - 46.0 %    MCV 86.2 78.0 - 100.0 fL    MCH 27.9 26.0 - 32.0 pg    MCHC 32.4 31.0 - 35.5 g/dL    RDW-CV 14.2 11.5 - 15.5 %    PLATELETS 72 (L) 150 - 400 x10^3/uL    NEUTROPHIL % 56 %    LYMPHOCYTE % 26 %    MONOCYTE % 13 %    EOSINOPHIL % 2 %    BASOPHIL % 2 %    NEUTROPHIL # 1.96 1.50 - 7.70 x10^3/uL    LYMPHOCYTE # 0.89 (L) 1.00 - 4.80 x10^3/uL    MONOCYTE # 0.46 0.20 - 1.10 x10^3/uL    EOSINOPHIL # <0.10 <=0.50 x10^3/uL    BASOPHIL # <0.10 <=0.20 x10^3/uL    IMMATURE GRANULOCYTE % 1 0 - 1 %    IMMATURE GRANULOCYTE # <0.10 <0.10 x10^3/uL       Imaging Studies:    XR AP MOBILE CHEST   Final Result by Edi, Radresults In (07/01 0930)   Improvement.      COPD.      Port-A-Cath.      No overt superimposed active cardiac or pulmonary or pleural disease.      This note was partially created using voice recognition software and is   inherently subject to errors including those of syntax and "sound alike "   substitutions which may escape proof reading.  In such instances, original   meaning may be extrapolated by contextual derivation.         Radiologist location ID: [id number]         CT ABDOMEN PELVIS W IV CONTRAST   Final Result by Edi, Radresults In (06/29 1348)   There is no intrahepatic ductal dilatation. Liver mass is not   seen. There is some ascites related to the liver.      There is also ascites related to the spleen and the spleen is of normal   size.      Increase in fecal burden consistent with constipation.      Large left low-lying ventral hernia. Bowel loops in it but the hiatus is   wide.      There does not appear to be major compromise of the intestinal loops. A   significant mechanical obstruction is not seen but there is mild prominence   of small bowel loops with excess fluid. Probably an enteritis pattern. Very   low-grade small bowel  obstruction not entirely excluded.         The CT exam was performed using one or more the following a dose reduction   techniques: Automated exposure control, adjustment of the mA and/or kV   according to the patient's size, or use of iterative reconstruction   technique.      This note was partially created using voice recognition software and is   inherently subject to errors including those of syntax and "sound alike "   substitutions which may escape proof reading.  In such instances, original   meaning may be extrapolated by contextual  derivation.         Radiologist location ID: [id number]         XR AP MOBILE CHEST   Final Result by Edi, Radresults In (06/29 1754)   Lung marking prominence. Differential may relate to technical   factors or viral interstitial pneumonia or early cardiac decompensation.      This note was partially created using voice recognition software and is   inherently subject to errors including those of syntax and "sound alike "   substitutions which may escape proof reading.  In such instances, original   meaning may be extrapolated by contextual derivation.         Radiologist location ID: [id number]         [place] RT UPPER QUADRANT   Final Result by Edi, Radresults In (06/29 1459)   There is ascites related to the liver.      This note was partially created using voice recognition software and is   inherently subject to errors including those of syntax and "sound alike "   substitutions which may escape proof reading.  In such instances, original   meaning may be extrapolated by contextual derivation.         Radiologist location ID: [id number]         CT BRAIN WO IV CONTRAST   Final Result by Edi, Radresults In (06/29 1123)   No change from prior. No new active intracranial pathology.      There are some age appropriate appearances.         The CT exam was performed using one or more the following a dose reduction   techniques: Automated exposure control, adjustment of the mA and/or kV    according to the patient's size, or use of iterative reconstruction   technique.      This note was partially created using voice recognition software and is   inherently subject to errors including those of syntax and "sound alike "   substitutions which may escape proof reading.  In such instances, original   meaning may be extrapolated by contextual derivation.         Radiologist location ID: [id number]             Assessment/Plan:  Hospital Problems    UTI (urinary tract infection)         Date Noted: 06/09/2020      Cirrhosis of liver with ascites (CMS [facility])         Date Noted: 02/11/2020      Longstanding persistent atrial fibrillation (CMS [facility])         Date Noted: 02/11/2020      CHF (congestive heart failure) (CMS [facility])         Date Noted: 04/13/2019      COPD (chronic obstructive pulmonary disease) (CMS [facility])         Date Noted: 04/13/2019      Essential hypertension         Date Noted: 04/13/2019      Hyperlipidemia         Date Noted: 04/13/2019      Resolved Hospital Problems  No resolved problems to display.      1. UTI urine cultures negative will continue Rocephin since the patient is symptomatic  2. Pancreatic cancer this is likely the reason for the patient's physical deconditioning  3. Physical deconditioning    Birdie Hopes, PA-C    I personally saw and examined the patient.  See mid-level note for additional details.  My findings are patient continues to have some dizziness and physical deconditioning.  She states she gets lightheaded when she tries to get up.  She is still on certain about going home yet.  Continue Rocephin.  We discussed possible home health and physical therapy but the patient thinks they do not "do very much for her" and she would rather do more of her own activities and exercise herself.    Maxcine Ham, MD  06/13/2020, 11:42

## 2020-06-14 LAB — COMPREHENSIVE METABOLIC PANEL, NON-FASTING
ALBUMIN: 2.2 g/dL — ABNORMAL LOW (ref 3.4–4.8)
ALKALINE PHOSPHATASE: 149 U/L — ABNORMAL HIGH (ref 55–145)
ALT (SGPT): 35 U/L — ABNORMAL HIGH (ref 8–22)
ANION GAP: 8 mmol/L (ref 4–13)
AST (SGOT): 20 U/L (ref 8–45)
BILIRUBIN TOTAL: 0.3 mg/dL (ref 0.3–1.3)
BUN/CREA RATIO: 13 (ref 6–22)
BUN: 14 mg/dL (ref 8–25)
CALCIUM: 7.8 mg/dL — ABNORMAL LOW (ref 8.8–10.2)
CHLORIDE: 101 mmol/L (ref 96–111)
CO2 TOTAL: 25 mmol/L (ref 23–31)
CREATININE: 1.1 mg/dL — ABNORMAL HIGH (ref 0.60–1.05)
ESTIMATED GFR: 50 mL/min/BSA — ABNORMAL LOW (ref >=60)
GLUCOSE: 132 mg/dL — ABNORMAL HIGH (ref 65–125)
POTASSIUM: 3.2 mmol/L — ABNORMAL LOW (ref 3.5–5.1)
PROTEIN TOTAL: 4.2 g/dL — ABNORMAL LOW (ref 6.0–8.0)
SODIUM: 134 mmol/L — ABNORMAL LOW (ref 136–145)

## 2020-06-14 LAB — CBC WITH DIFF
BASOPHIL #: <0.1 10*3/uL (ref <=0.20)
BASOPHIL %: 1 %
EOSINOPHIL #: 0.11 10*3/uL (ref <=0.50)
EOSINOPHIL %: 3 %
HCT: 27.6 % — ABNORMAL LOW (ref 34.8–46.0)
HGB: 9 g/dL — ABNORMAL LOW (ref 11.5–16.0)
IMMATURE GRANULOCYTE #: <0.1 10*3/uL (ref <0.10)
IMMATURE GRANULOCYTE %: 0 % (ref 0–1)
LYMPHOCYTE #: 0.97 10*3/uL — ABNORMAL LOW (ref 1.00–4.80)
LYMPHOCYTE %: 27 %
MCH: 27.8 pg (ref 26.0–32.0)
MCHC: 32.6 g/dL (ref 31.0–35.5)
MCV: 85.2 fL (ref 78.0–100.0)
MONOCYTE #: 0.38 10*3/uL (ref 0.20–1.10)
MONOCYTE %: 10 %
MPV: 12.5 fL (ref 8.7–12.5)
NEUTROPHIL #: 2.16 10*3/uL (ref 1.50–7.70)
NEUTROPHIL %: 59 %
PLATELETS: 66 10*3/uL — ABNORMAL LOW (ref 150–400)
RBC: 3.24 10*6/uL — ABNORMAL LOW (ref 3.85–5.22)
RDW-CV: 14.2 % (ref 11.5–15.5)
WBC: 3.7 10*3/uL (ref 3.7–11.0)

## 2020-06-14 LAB — POC BLOOD GLUCOSE (RESULTS): GLUCOSE, POC: 127 mg/dl — ABNORMAL HIGH (ref 60–110)

## 2020-06-14 MED ORDER — TRIMETHOPRIM 100 MG TABLET
100.00 mg | ORAL_TABLET | Freq: Every day | ORAL | 0 refills | Status: DC
Start: 2020-06-14 — End: 2020-07-20

## 2020-06-14 MED ORDER — NYSTATIN 100,000 UNIT/ML ORAL SUSPENSION
5.00 mL | Freq: Four times a day (QID) | ORAL | 0 refills | Status: AC
Start: 2020-06-14 — End: 2020-06-21

## 2020-06-14 NOTE — Care Plan (Signed)
Problem: Adult Inpatient Plan of Care  Goal: Plan of Care Review  Outcome: Adequate for Discharge  Goal: Patient-Specific Goal (Individualized)  Outcome: Adequate for Discharge  Goal: Absence of Hospital-Acquired Illness or Injury  Outcome: Adequate for Discharge  Goal: Optimal Comfort and Wellbeing  Outcome: Adequate for Discharge  Goal: Rounds/Family Conference  Outcome: Adequate for Discharge   Patient will be discharged to family

## 2020-06-14 NOTE — Nurses Notes (Signed)
Patient discharged home with family.  AVS reviewed with patient/care giver.  A written copy of the AVS and discharge instructions was given to the patient/care giver.  Questions sufficiently answered as needed.  Patient/care giver encouraged to follow up with PCP as indicated.  In the event of an emergency, patient/care giver instructed to call 911 or go to the nearest emergency room.

## 2020-06-14 NOTE — Discharge Summary (Signed)
[facility]  DISCHARGE SUMMARY      PATIENT NAME:  [name], [name]  MRN:  [id number]  DOB:  1946/04/11    INPATIENT ADMISSION DATE: 06/11/2020  DISCHARGE DATE:  06/14/2020    ATTENDING PHYSICIAN: Maxcine Ham, MD  SERVICE: [facility] HOSPITALIST  PRIMARY CARE PHYSICIAN: Myrene Buddy, MD       Reason for Admission     Diagnosis    UTI (urinary tract infection) [284132]          DISCHARGE DIAGNOSIS:     Principal Problem:  UTI (urinary tract infection)    Active Hospital Problems    Diagnosis Date Noted   . Principle Problem: UTI (urinary tract infection) [N39.0] 06/09/2020   . 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) [K74.60, R18.8] 02/11/2020   . Longstanding persistent atrial fibrillation (CMS HCC) [I48.11] 02/11/2020   . CHF (congestive heart failure) (CMS HCC) [I50.9] 04/13/2019   . COPD (chronic obstructive pulmonary disease) (CMS HCC) [J44.9] 04/13/2019   . Essential hypertension [I10] 04/13/2019   . Hyperlipidemia [E78.5] 04/13/2019   . Pancreatic cancer (CMS [place]) [C25.9] 10/25/2017   . Type 2 diabetes mellitus with diabetic polyneuropathy, with long-term current use of insulin (CMS HCC) [E11.42, Z79.4] 01/24/2017      Resolved Hospital Problems   No resolved problems to display.     Active Non-Hospital Problems    Diagnosis Date Noted   . Chest pain 03/31/2020   . Hypothermia 03/26/2020   . Hyperglycemia 03/26/2020   . Palliative care status 02/13/2020   . 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) 02/11/2020   . Moderate protein-calorie malnutrition (CMS HCC) 02/11/2020   . Major depressive disorder, single episode, in full remission (CMS [place]) 02/11/2020   . Acute exacerbation of CHF (congestive heart failure) (CMS HCC) 01/31/2020   . Neutropenia, unspecified (CMS [place]) 10/11/2019   . Other specified polyneuropathies 10/11/2019   . Morbid (severe) obesity due to excess calories (CMS [place]) 10/11/2019   . Lung nodules 04/15/2019   . DNR (do not resuscitate) 04/13/2019   . SIADH (syndrome  of inappropriate ADH production) (CMS [place]) 04/13/2019   . Headache 10/24/2018   . Hyponatremia 10/24/2018   . Anasarca 08/25/2018   . PE (pulmonary thromboembolism) (CMS HCC) 03/29/2018   . Hypokalemia 11/23/2017   . Asthma 01/24/2017      Allergies   Allergen Reactions   . Sulfa (Sulfonamides) Itching          REASON FOR HOSPITALIZATION AND HOSPITAL COURSE:    This is a 74 y.o., female who is admitted for urinary tract infection and elevated troponin.  Patient presented to the emergency department with complaints of a headache.  Patient has frequent headaches has been to the emergency department for them numerous times.  Patient normally ambulates on her own but she says she is no longer able to stand up.  The patient has a history of cirrhosis, pancreatic cancer status post [place], and recurrent UTIs.    Was feeling some better following day.  She was continued on Zyvox and Rocephin.  Urine culture did not grow any significant growth.  We continued her Rocephin.  The patient states she still continued to feel somewhat weak and dizzy headed.  She had poor appetite.  Her appetite is improving and she says she is feeling a bit stronger.  She however states she does feel weaker than normal is interested in home health for physical therapy.  At this time she  states she is not really interested in staying longer in the hospital for physical therapy.  She would like to be home.  The patient does have a history of pancreatic cancer and does have episodes of abdominal pain secondary to the cancer.  Since the patient has had no growth on her urine but has been having recurrent urinary tract infections we discussed with her using suppressive therapy the patient be started on trimethoprim 100 milligram p.o. daily.      DISCHARGE MEDICATIONS:     Current Discharge Medication List      START taking these medications.      Details   trimethoprim 100 mg Tablet  Commonly known as: TRIMPEX   100 mg, Oral, DAILY  Qty: 30 Tablet   Refills: 0        CONTINUE these medications which have CHANGED during your visit.      Details   dilTIAZem 30 mg Tablet  Commonly known as: CARDIZEM  What changed:    how much to take   when to take this   30 mg, Oral, 4 TIMES DAILY  Qty: 120 Tablet  Refills: 5     insulin aspart U-100 100 unit/mL (3 mL) Insulin Pen  Commonly known as: NovoLOG Flexpen U-100 Insulin  What changed:    how much to take   additional instructions   7 Units, Subcutaneous, 2 TIMES DAILY BEFORE MEALS  Qty: 9 Each  Refills: 3        CONTINUE these medications - NO CHANGES were made during your visit.      Details   Acetaminophen-Butalbital 50-325 mg Tablet   Take by mouth  Refills: 0     aspirin 81 mg Tablet, Delayed Release (E.C.)  Commonly known as: ECOTRIN   81 mg, Oral, DAILY  Refills: 0     atorvastatin 10 mg Tablet  Commonly known as: LIPITOR   TAKE 1 TABLET BY MOUTH ONCE DAILY  Qty: 90 Tab  Refills: 1     cetirizine 10 mg Tablet  Commonly known as: ZYRTEC   10 mg, Oral, DAILY  Refills: 0     furosemide 80 mg Tablet  Commonly known as: LASIX   120 mg, Oral, DAILY  Qty: 135 Tablet  Refills: 0     hydrALAZINE 25 mg Tablet  Commonly known as: APRESOLINE   25 mg, Oral, 2 TIMES DAILY  Qty: 60 Tablet  Refills: 5     Lantus Solostar U-100 Insulin 100 unit/mL Insulin Pen  Generic drug: insulin glargine   10 Units, Subcutaneous, NIGHTLY  Qty: 3 mL  Refills: 5     lidocaine HCL 2 % Solution  Commonly known as: XYLOCAINE   5 mL  Refills: 0     LORazepam 0.5 mg Tablet  Commonly known as: ATIVAN   0.5 mg, Oral, EVERY EVENING  Qty: 30 Tablet  Refills: 2     losartan 100 mg Tablet  Commonly known as: COZAAR   100 mg, Oral, DAILY  Qty: 90 Tablet  Refills: 0     metoprolol succinate 50 mg Tablet Sustained Release 24 hr  Commonly known as: TOPROL-XL   50 mg, Oral, DAILY  Qty: 30 Tablet  Refills: 0     Nano Pen Needle 32 gauge x 5/32" Needle  Generic drug: insulin pen needles   USE WITH INSULIN FLEXPEN BID AS NEEDED  Refills: 0     nitrofurantoin  100 mg Capsule  Commonly known as: MACROBID   100  mg, Oral, NIGHTLY  Qty: 30 Capsule  Refills: 3     nitroGLYCERIN 0.4 mg Tablet, Sublingual  Commonly known as: NITROSTAT   0.4 mg, Sublingual, EVERY 5 MIN PRN, for 3 doses over 15 minutes  Qty: 30 Tablet  Refills: 5     omeprazole 40 mg Capsule, Delayed Release(E.C.)  Commonly known as: PRILOSEC   40 mg, Oral, DAILY  Qty: 90 Capsule  Refills: 4     ondansetron 4 mg Tablet  Commonly known as: Zofran   4 mg, Oral, EVERY 6 HOURS PRN  Qty: 120 Tablet  Refills: 3     oxyCODONE-acetaminophen 7.5-325 mg Tablet  Commonly known as: PERCOCET   1 Tablet, Oral, EVERY 6 HOURS PRN  Qty: 120 Tablet  Refills: 0     pancreatic enzyme replacement 12,000-38,000 -60,000 unit Capsule, Delayed Release(E.C.)  Commonly known as: CREON   2 Capsules, Oral, 3 TIMES DAILY WITH MEALS, 2 CAPS WITH MEALS AND 1 CAP WITH EACH SNACK   Qty: 540 Capsule  Refills: 1     sodium chloride 1 gram Tablet   1 g, Oral, 3 TIMES DAILY WITH MEALS  Qty: 90 Tablet  Refills: 5     tiotropium bromide 18 mcg Capsule, w/Inhalation Device  Commonly known as: SPIRIVA HANDIHALER   18 mcg, Inhalation, DAILY  Qty: 30 Capsule  Refills: 5            Filed Vitals:    06/13/20 1918 06/13/20 2353 06/14/20 0424 06/14/20 0733   BP:  (!) 148/57 (!) 156/57 (!) 147/41   Pulse:  76 74 64   Resp: 20 18 18 20    Temp:  36.7 C (98.1 F) 36.7 C (98.1 F) 36.7 C (98.1 F)   SpO2:          Physical Exam  Constitutional:       Appearance: She is ill-appearing.      Comments: Chronically ill appearing white female lying in bed she is in no acute distress this time   HENT:      Head: Normocephalic and atraumatic.   Eyes:      Conjunctiva/sclera: Conjunctivae normal.      Pupils: Pupils are equal, round, and reactive to light.   Neck:      Thyroid: No thyromegaly.      Vascular: No JVD.   Cardiovascular:      Rate and Rhythm: Normal rate and regular rhythm.      Heart sounds: Normal heart sounds. No murmur heard.   No friction rub. No gallop.     Pulmonary:      Effort: No respiratory distress.      Breath sounds: Normal breath sounds. No wheezing or rales.   Chest:      Chest wall: No tenderness.   Abdominal:      General: Bowel sounds are normal. There is no distension.      Palpations: Abdomen is soft. There is no mass.      Tenderness: There is abdominal tenderness. There is no guarding or rebound.      Comments: Mild epigastric tenderness   Musculoskeletal:         General: No tenderness or deformity. Normal range of motion.      Cervical back: Normal range of motion and neck supple.   Lymphadenopathy:      Cervical: No cervical adenopathy.   Skin:     General: Skin is warm and dry.      Coloration: Skin is  not pale.      Findings: No rash.   Neurological:      Mental Status: She is alert and oriented to person, place, and time.      Cranial Nerves: No cranial nerve deficit.      Motor: No abnormal muscle tone.      Coordination: Coordination normal.      Gait: Gait is intact.      Deep Tendon Reflexes: Reflexes are normal and symmetric.   Psychiatric:         Mood and Affect: Mood and affect normal.         Cognition and Memory: Memory normal.           Laboratory Data:     Results for orders placed or performed during the hospital encounter of 06/09/20 (from the past 24 hour(s))   POC BLOOD GLUCOSE (RESULTS)   Result Value Ref Range    GLUCOSE, POC 233 (H) 60 - 110 mg/dl   POC BLOOD GLUCOSE (RESULTS)   Result Value Ref Range    GLUCOSE, POC 260 (H) 60 - 110 mg/dl   POC BLOOD GLUCOSE (RESULTS)   Result Value Ref Range    GLUCOSE, POC 249 (H) 60 - 110 mg/dl   COMPREHENSIVE METABOLIC PANEL, NON-FASTING   Result Value Ref Range    SODIUM 134 (L) 136 - 145 mmol/L    POTASSIUM 3.2 (L) 3.5 - 5.1 mmol/L    CHLORIDE 101 96 - 111 mmol/L    CO2 TOTAL 25 23 - 31 mmol/L    ANION GAP 8 4 - 13 mmol/L    BUN 14 8 - 25 mg/dL    CREATININE 1.10 (H) 0.60 - 1.05 mg/dL    BUN/CREA RATIO 13 6 - 22    ESTIMATED GFR 50 (L) >=60 mL/min/BSA    ALBUMIN 2.2 (L) 3.4 - 4.8 g/dL      CALCIUM 7.8 (L) 8.8 - 10.2 mg/dL    GLUCOSE 132 (H) 65 - 125 mg/dL    ALKALINE PHOSPHATASE 149 (H) 55 - 145 U/L    ALT (SGPT) 35 (H) 8 - 22 U/L    AST (SGOT)  20 8 - 45 U/L    BILIRUBIN TOTAL 0.3 0.3 - 1.3 mg/dL    PROTEIN TOTAL 4.2 (L) 6.0 - 8.0 g/dL   CBC WITH DIFF   Result Value Ref Range    WBC 3.7 3.7 - 11.0 x10^3/uL    RBC 3.24 (L) 3.85 - 5.22 x10^6/uL    HGB 9.0 (L) 11.5 - 16.0 g/dL    HCT 27.6 (L) 34.8 - 46.0 %    MCV 85.2 78.0 - 100.0 fL    MCH 27.8 26.0 - 32.0 pg    MCHC 32.6 31.0 - 35.5 g/dL    RDW-CV 14.2 11.5 - 15.5 %    PLATELETS 66 (L) 150 - 400 x10^3/uL    MPV 12.5 8.7 - 12.5 fL    NEUTROPHIL % 59 %    LYMPHOCYTE % 27 %    MONOCYTE % 10 %    EOSINOPHIL % 3 %    BASOPHIL % 1 %    NEUTROPHIL # 2.16 1.50 - 7.70 x10^3/uL    LYMPHOCYTE # 0.97 (L) 1.00 - 4.80 x10^3/uL    MONOCYTE # 0.38 0.20 - 1.10 x10^3/uL    EOSINOPHIL # 0.11 <=0.50 x10^3/uL    BASOPHIL # <0.10 <=0.20 x10^3/uL    IMMATURE GRANULOCYTE % 0 0 - 1 %    IMMATURE  GRANULOCYTE # <0.10 <0.10 x10^3/uL   POC BLOOD GLUCOSE (RESULTS)   Result Value Ref Range    GLUCOSE, POC 127 (H) 60 - 110 mg/dl       Imaging Studies:    XR AP MOBILE CHEST   Final Result by Edi, Radresults In (07/01 0930)   Improvement.      COPD.      Port-A-Cath.      No overt superimposed active cardiac or pulmonary or pleural disease.      This note was partially created using voice recognition software and is   inherently subject to errors including those of syntax and "sound alike "   substitutions which may escape proof reading.  In such instances, original   meaning may be extrapolated by contextual derivation.         Radiologist location ID: [id number]         CT ABDOMEN PELVIS W IV CONTRAST   Final Result by Edi, Radresults In (06/29 1348)   There is no intrahepatic ductal dilatation. Liver mass is not   seen. There is some ascites related to the liver.      There is also ascites related to the spleen and the spleen is of normal   size.      Increase in fecal burden  consistent with constipation.      Large left low-lying ventral hernia. Bowel loops in it but the hiatus is   wide.      There does not appear to be major compromise of the intestinal loops. A   significant mechanical obstruction is not seen but there is mild prominence   of small bowel loops with excess fluid. Probably an enteritis pattern. Very   low-grade small bowel obstruction not entirely excluded.         The CT exam was performed using one or more the following a dose reduction   techniques: Automated exposure control, adjustment of the mA and/or kV   according to the patient's size, or use of iterative reconstruction   technique.      This note was partially created using voice recognition software and is   inherently subject to errors including those of syntax and "sound alike "   substitutions which may escape proof reading.  In such instances, original   meaning may be extrapolated by contextual derivation.         Radiologist location ID: [id number]         XR AP MOBILE CHEST   Final Result by Edi, Radresults In (06/29 1754)   Lung marking prominence. Differential may relate to technical   factors or viral interstitial pneumonia or early cardiac decompensation.      This note was partially created using voice recognition software and is   inherently subject to errors including those of syntax and "sound alike "   substitutions which may escape proof reading.  In such instances, original   meaning may be extrapolated by contextual derivation.         Radiologist location ID: [id number]         [place] RT UPPER QUADRANT   Final Result by Edi, Radresults In (06/29 1459)   There is ascites related to the liver.      This note was partially created using voice recognition software and is   inherently subject to errors including those of syntax and "sound alike "   substitutions which may escape proof reading.  In such instances, original  meaning may be extrapolated by contextual derivation.         Radiologist  location ID: [id number]         CT BRAIN WO IV CONTRAST   Final Result by Edi, Radresults In (06/29 1123)   No change from prior. No new active intracranial pathology.      There are some age appropriate appearances.         The CT exam was performed using one or more the following a dose reduction   techniques: Automated exposure control, adjustment of the mA and/or kV   according to the patient's size, or use of iterative reconstruction   technique.      This note was partially created using voice recognition software and is   inherently subject to errors including those of syntax and "sound alike "   substitutions which may escape proof reading.  In such instances, original   meaning may be extrapolated by contextual derivation.         Radiologist location ID: [id number]                 DISCHARGE INSTRUCTIONS:  Follow-up Information     Myrene Buddy, MD In 1 week.    Specialty: FAMILY MEDICINE  Contact information:  [address]  [place] [address]  [phone]                    Refer to [place] - DIET     Diet: RESUME HOME DIET      DISCHARGE INSTRUCTION - ACTIVITY     Activity: AS TOLERATED             Advance Directive Information      Most Recent Value   Does the Patient have an Advance Directive?  Yes, Patient Does Have Advance Directive for Healthcare Treatment   Type of Advance Directive Completed  Medical Living Will, Medical Power of Attorney   Copy of Advance Directives in Chart?  Yes, Copy on Chart.(Specify in [place] Directive)   Name of MPOA or [place] and Deetta Perla   Phone Number of [place] or Healthcare Surrogate  [phone] and [phone]          CONDITION ON DISCHARGE: Alert, Oriented and VS Stable    DISCHARGE DISPOSITION:  Home discharge     Maxcine Ham, MD    Copies sent to Care Team       Relationship Specialty Notifications Start End    Myrene Buddy, MD PCP - General FAMILY MEDICINE   07/06/19     Phone: [phone] Fax: [phone]         [address] [place] [address]    Lerry Liner, MD PCP - Oncology -  Primary EXTERNAL  02/11/20     Phone: [phone] Fax: [phone]         [address] [place] [address]    Royetta Crochet, MD PCP - Surgeon GENERAL SURGERY Admissions 02/11/20     Phone: [phone] Fax: [phone]         [place] [address]    Tonye Becket, [place]  10/11/19     Phone: [phone] Fax: [phone]         [address] [place] [address]    Vedia Pereyra, MD OB/GYN Physician FAMILY MEDICINE Admissions 02/11/20  Phone: [phone] Fax: [phone]         [address] [place] [address]    Nicola Police, MD Medical Oncologist GENERAL SURGERY Admissions 02/11/20     Phone: [phone] Fax: [phone]         [place] [facility] [address]            Referring providers can utilize [email or web address] to access their referred [facility] patient's information.

## 2020-06-16 ENCOUNTER — Telehealth (HOSPITAL_COMMUNITY): Payer: Self-pay

## 2020-06-16 NOTE — Nursing Note (Signed)
(  dot)Transition of Care Contact  Discharge Date: 06/14/2020  Transition Facility Type--Hospital (Inpatient or Observation)  [facility]  Interactive Contact(s): Completed or attempted contact indicated by Date/Time  Completed Contact--06/16/2020 11:15 AM  First Attempt  Second Attempt  Third Attempt  Contact Method(s)-- Patient/Caregiver Telephone  Clinical Staff Name/Role who [name], LPN  Transition Assessment  Discharge Summary obtained?--Yes  How are you recovering?--Improving  Discharge Meds obtained?--Yes  Medications reconcilled with Discharge Documentation?--Yes  Medication understanding --knows new medication(s)--knows purpose of medication  Medication Concerns?--No  Have everything needed for recovery?--Yes  Care Coordination:   Patient has transition follow-up appointment date and time?  Primary Care Transition Visit planned?--Yes  Specialist Transition Visit planned?  Patient/caregiver plans to attend transition visit?--Yes  Primary Follow-up Barrier  Interventions provided --reinforced discharge instructions--med list reviewed with caregiver  Home Health or DME ordered at discharge?--Yes  Agency has contacted patient to initiate services?--Yes  Home Health or DME Agency:--[facility]  Clinician/Team notified?  Primary reason clinician notified?  Transition Note:   Spoke with daughter in law who states patient is "doing as well as can be expected".  Denies fever, chills or N/V.  Home meds taken as directed.  Home Health will be resumed thru [facility].  Stated she would call [facility] to make follow up appt today.  Declined assistance with this.      Further information may be documented in relevant telephone or outreach encounter.

## 2020-06-16 NOTE — Care Management Notes (Signed)
Referral Information  ++++++ Placed Provider #1 ++++++  Case Manager: Carla Johnson  Provider Type: Home Health - Return  Provider Name: [facility]  Address:  [address]  [place], [place] [address]  Contact: Tracy McClure    Phone: [phone] x  Fax:   Fax: [phone]

## 2020-06-19 ENCOUNTER — Ambulatory Visit: Payer: Medicare Other | Attending: FAMILY PRACTICE | Admitting: FAMILY PRACTICE

## 2020-06-19 ENCOUNTER — Encounter (INDEPENDENT_AMBULATORY_CARE_PROVIDER_SITE_OTHER): Payer: Self-pay | Admitting: FAMILY PRACTICE

## 2020-06-19 DIAGNOSIS — R079 Chest pain, unspecified: Secondary | ICD-10-CM | POA: Insufficient documentation

## 2020-06-19 DIAGNOSIS — I4811 Longstanding persistent atrial fibrillation: Secondary | ICD-10-CM

## 2020-06-19 DIAGNOSIS — N390 Urinary tract infection, site not specified: Secondary | ICD-10-CM | POA: Insufficient documentation

## 2020-06-19 DIAGNOSIS — I10 Essential (primary) hypertension: Secondary | ICD-10-CM

## 2020-06-19 DIAGNOSIS — C259 Malignant neoplasm of pancreas, unspecified: Secondary | ICD-10-CM | POA: Insufficient documentation

## 2020-06-19 DIAGNOSIS — R188 Other ascites: Secondary | ICD-10-CM

## 2020-06-19 DIAGNOSIS — I509 Heart failure, unspecified: Secondary | ICD-10-CM | POA: Insufficient documentation

## 2020-06-19 DIAGNOSIS — K7460 Unspecified cirrhosis of liver: Secondary | ICD-10-CM | POA: Insufficient documentation

## 2020-06-19 DIAGNOSIS — I4891 Unspecified atrial fibrillation: Secondary | ICD-10-CM | POA: Insufficient documentation

## 2020-06-19 DIAGNOSIS — I110 Hypertensive heart disease with heart failure: Secondary | ICD-10-CM | POA: Insufficient documentation

## 2020-06-19 NOTE — Assessment & Plan Note (Signed)
No chest pain

## 2020-06-19 NOTE — Assessment & Plan Note (Signed)
No orthopnea, marked edema in ankles, or shortness a breath

## 2020-06-19 NOTE — Nursing Note (Signed)
Pt being seen today for a hospital follow up. She was admitted due to a UTI. Pt reports she is feeling better since being home from the hospital. Has not received covid vaccines. Melburn Popper, RN

## 2020-06-19 NOTE — Assessment & Plan Note (Signed)
Still in atrial fibrillation but heart rate controlled

## 2020-06-19 NOTE — Assessment & Plan Note (Signed)
No symptoms of UTI now

## 2020-06-19 NOTE — Assessment & Plan Note (Signed)
No overt ascites now

## 2020-06-19 NOTE — Assessment & Plan Note (Signed)
Blood pressure now good

## 2020-06-19 NOTE — Progress Notes (Signed)
Patient is having a telemedicine visit follow-up for her recent admission and discharge from [facility] from June 11, 2020 till June 14, 2020. At that time she had been admitted for UTI, cirrhosis of the liver, longstanding persistent atrial fibrillation, congestive heart failure, COPD, hypertension, hyperlipidemia, and pancreatic cancer, diabetes type 2.      Transitional Care services provided:  Discharge documentation was reviewed, Discharge documentation used to reconcile outpatient medication list, Durable medical equipment has previously ordered and obtained and No pending tests or treatments.  Transition of Care Contact  Discharge Date: 06/14/2020  Transition Facility Type--Hospital (Inpatient or Observation)  [facility]  Interactive Contact(s): Completed or attempted contact indicated by Date/Time  Completed Contact--06/16/2020 11:15 AM  First Attempt  Second Attempt  Third Attempt  Contact Method(s)-- Patient/Caregiver Telephone  Clinical Staff Name/Role who [name], LPN  Transition Assessment  Discharge Summary obtained?--Yes  How are you recovering?--Improving  Discharge Meds obtained?--Yes  Medications reconcilled with Discharge Documentation?--Yes  Medication understanding --knows new medication(s)--knows purpose of medication  Medication Concerns?--No  Have everything needed for recovery?--Yes  Care Coordination:   Patient has transition follow-up appointment date and time?  Primary Care Transition Visit planned?--Yes  Specialist Transition Visit planned?  Patient/caregiver plans to attend transition visit?--Yes  Primary Follow-up Barrier  Interventions provided --reinforced discharge instructions--med list reviewed with caregiver  Home Health or DME ordered at discharge?--Yes  Agency has contacted patient to initiate services?--Yes  Home Health or DME Agency:--[facility]  Clinician/Team notified?  Primary reason  clinician notified?  Transition Note:   Spoke with daughter in law who states patient is "doing as well as can be expected".  Denies fever, chills or N/V.  Home meds taken as directed.  Home Health will be resumed thru [facility].  Stated she would call [facility] to make follow up appt today.  Declined assistance with this.      Further information may be documented in relevant telephone or outreach encounter.    Family Medicine  Progress Note    Date of Service:  06/19/2020  [name], [name], 74 y.o. female  Date of Admission:  (Not on file)  Date of Birth:  1946/09/05  PCP: Myrene Buddy, MD    Patient's location: [place] [address]   Patient/family aware of provider location: Yes  Patient/family consent for video visit: Yes  Interview and observation performed by: Myrene Buddy, MD    HPI:  Patient here for follow up after hospital admission for the UTI. A telemedicine visit was done today.  The patient is feeling  Much better.   She has no dysuria.  No diarrhea.  She has a small about of abdominal discomfort, but she just stopped eating a meal.  She is not having and N&V.  She denies chest pain, cough or dyspnea.  She does not need any refills.  I told them to have the home health nurse check a CBC and CMP next week when they visit.    No current facility-administered medications for this visit.       Allergies   Allergen Reactions   . Sulfa (Sulfonamides) Itching             BP 138/72   Pulse 78   Ht 1.575 m (5\' 2" )   Wt 73.5 kg (162 lb)   BMI 29.63 kg/m         Review of Systems   Constitutional: Negative for chills, fever and malaise/fatigue.  HENT: Negative for congestion and sinus pain.    Respiratory: Negative for cough and shortness of breath.    Cardiovascular: Negative for chest pain.   Gastrointestinal: Positive for abdominal pain. Negative for blood in stool, constipation, diarrhea, nausea and vomiting.   Genitourinary: Negative for dysuria.   Skin: Negative for rash.       Observational exam    Alert, oriented, no shortness of breath  Oriented, no focal neurological deficits.        Laboratory Data:       Lab Results   Component Value Date    CHOLESTEROL 106 04/01/2020    HDLCHOL 59 04/01/2020    LDLCHOL 35 04/01/2020    TRIG 61 04/01/2020     BASIC METABOLIC PANEL  Lab Results   Component Value Date    SODIUM 134 (L) 06/14/2020    POTASSIUM 3.2 (L) 06/14/2020    CHLORIDE 101 06/14/2020    CO2 25 06/14/2020    ANIONGAP 8 06/14/2020    BUN 14 06/14/2020    CREATININE 1.10 (H) 06/14/2020    BUNCRRATIO 13 06/14/2020    GFR 50 (L) 06/14/2020    CALCIUM 7.8 (L) 06/14/2020    GLUCOSENF 132 (H) 06/14/2020      CBC  Diff   Lab Results   Component Value Date/Time    WBC 3.7 06/14/2020 05:52 AM    HGB 9.0 (L) 06/14/2020 05:52 AM    HCT 27.6 (L) 06/14/2020 05:52 AM    PLTCNT 66 (L) 06/14/2020 05:52 AM    RBC 3.24 (L) 06/14/2020 05:52 AM    MCV 85.2 06/14/2020 05:52 AM    MCHC 32.6 06/14/2020 05:52 AM    MCH 27.8 06/14/2020 05:52 AM    RDW 16.8 12/04/2019 12:00 AM    MPV 12.5 06/14/2020 05:52 AM    Lab Results   Component Value Date/Time    PMNS 59 06/14/2020 05:52 AM    LYMPHOCYTES 27 09/21/2018 06:32 PM    EOSINOPHIL 6 09/21/2018 06:32 PM    MONOCYTES 10 06/14/2020 05:52 AM    BASOPHILS 1 06/14/2020 05:52 AM    BASOPHILS <0.10 06/14/2020 05:52 AM    PMNABS 2.16 06/14/2020 05:52 AM    LYMPHSABS 0.97 (L) 06/14/2020 05:52 AM    EOSABS 0.11 06/14/2020 05:52 AM    MONOSABS 0.38 06/14/2020 05:52 AM            Imaging Studies  Recent Results (from the past [id number] hour(s))   XR ABD FLAT AND UPRIGHT SERIES (W PA CHEST)    Collection Time: 03/13/20  9:37 AM    Narrative    [name]    PROCEDURE DESCRIPTION: XR ABD FLAT AND UPRIGHT SERIES (W PA CHEST)    PROCEDURE PERFORMED DATE AND TIME: 03/13/2020 9:37 AM    CLINICAL INDICATION: Generalized weakness.  History of pancreatic cancer.   Portable chest on the abdominal series please    TECHNIQUE: 4 views / 4 images submitted.    COMPARISON:      FINDINGS: Borderline  heart size. Elements of chronic interstitial  thickening. Port-A-Cath in place. Negative for free air underneath the  hemidiaphragms.    The intestinal gas distribution is nonspecific/nonobstructive.        Impression    Chronic chest changes. No acute cardiac or pulmonary or pleural  disease.    Port-A-Cath in place.    No free air underneath the hemidiaphragms.    No significant pathology demonstrated with conventional radiography in the  abdomen.    This note was partially created using voice recognition software and is  inherently subject to errors including those of syntax and "sound alike "  substitutions which may escape proof reading.  In such instances, original  meaning may be extrapolated by contextual derivation.      Radiologist location ID: [id number]     XR CHEST PA AND LATERAL    Collection Time: 10/18/17  5:22 PM    Narrative    Ravina Mccuistion  Female, 74 years old.    XR CHEST PA AND LATERAL performed on 10/18/2017 5:22 PM.    REASON FOR EXAM:  C25.9: Malignant neoplasm of pancreas, unspecified  location of malignancy (CMS HCC)    TECHNIQUE: 2 views/3 images submitted for interpretation.    COMPARISON:  None    FINDINGS:  There is no focal consolidation, pleural effusion, or  pneumothorax. The heart size.    Support devices:  Right IJ medication port with catheter tip projecting over the upper SVC.      Impression    Unremarkable chest radiographs.           Assessment/Plan:    Problem List Items Addressed This Visit        Unprioritized    Essential hypertension (Chronic)     Blood pressure now good         CHF (congestive heart failure) (CMS HCC) (Chronic)     No orthopnea, marked edema in ankles, or shortness a breath         Longstanding persistent atrial fibrillation (CMS HCC) (Chronic)     Still in atrial fibrillation but heart rate controlled         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)     No overt ascites now         Chest pain     No chest pain         UTI (urinary tract infection)      No symptoms of UTI now             Follow up visit in 2 weeks    This note was partially created using voice recognition software and is inherently subject to errors including those of syntax and "sound alike " substitutions which may escape proof reading.  In such instances, original meaning may be extrapolated by contextual derivation.    Myrene Buddy, MD

## 2020-06-25 ENCOUNTER — Telehealth (HOSPITAL_COMMUNITY): Payer: Self-pay

## 2020-06-25 NOTE — Telephone Encounter (Signed)
Discharge date 06/14/2020 Dx: UTI    06/25/2020 0950--Unable to reach patient for follow up at this time.  No answer.

## 2020-07-03 ENCOUNTER — Other Ambulatory Visit (INDEPENDENT_AMBULATORY_CARE_PROVIDER_SITE_OTHER): Payer: Self-pay | Admitting: FAMILY PRACTICE

## 2020-07-03 ENCOUNTER — Encounter (INDEPENDENT_AMBULATORY_CARE_PROVIDER_SITE_OTHER): Payer: Self-pay | Admitting: FAMILY PRACTICE

## 2020-07-03 MED ORDER — LOSARTAN 100 MG TABLET
100.00 mg | ORAL_TABLET | Freq: Every day | ORAL | 0 refills | Status: DC
Start: 2020-07-03 — End: 2020-09-24

## 2020-07-03 MED ORDER — OXYCODONE-ACETAMINOPHEN 7.5 MG-325 MG TABLET
1.00 | ORAL_TABLET | Freq: Four times a day (QID) | ORAL | 0 refills | Status: DC | PRN
Start: 2020-07-03 — End: 2020-07-30

## 2020-07-03 MED ORDER — FUROSEMIDE 80 MG TABLET
120.00 mg | ORAL_TABLET | Freq: Every day | ORAL | 0 refills | Status: DC
Start: 2020-07-03 — End: 2020-09-24

## 2020-07-03 MED ORDER — LORAZEPAM 0.5 MG TABLET
0.50 mg | ORAL_TABLET | Freq: Every evening | ORAL | 2 refills | Status: DC
Start: 2020-07-03 — End: 2020-09-24

## 2020-07-03 NOTE — Telephone Encounter (Signed)
[facility] was last reviewed on 07/03/2020  9:04 AM by Karmen Bongo L and result was REVIEWED PDMP.     Based on this, I have no concerns for prescribing controlled substances at this time.      This patient has end stage pancreatic cancer.

## 2020-07-06 ENCOUNTER — Telehealth (INDEPENDENT_AMBULATORY_CARE_PROVIDER_SITE_OTHER): Payer: Self-pay | Admitting: FAMILY PRACTICE

## 2020-07-06 ENCOUNTER — Telehealth (HOSPITAL_COMMUNITY): Payer: Self-pay

## 2020-07-06 MED ORDER — BD ULTRA-FINE NANO PEN NEEDLE 32 GAUGE X 5/32"
5 refills | Status: DC
Start: 2020-07-06 — End: 2020-10-21

## 2020-07-06 NOTE — Telephone Encounter (Signed)
-----   Message from Lucretia Field, RN sent at 07/06/2020  9:30 AM EDT -----  Regarding: FW: Prescription Question  Contact: [phone]    ----- Message -----  From: [name]  Sent: 07/03/2020   7:27 AM EDT  To: , #  Subject: Prescription Question                            Good morning Dr [name],    I had sent for some refills this morning and 2 things were listed as not available on the MyChart- they are:    Nano Pen Needles 32 gauge x 5/32  And  Trimethoprim 100mg  1x/d    If you could help me with these I would greatly appreciate it. Thank you and I hope you have a blessed day/weekend.    Geronimo Running   [phone] cell

## 2020-07-06 NOTE — Telephone Encounter (Signed)
Discharge date 06/14/2020 Dx: UTI    07/06/2020 1055--Spoke with son who states patient is doing better.  Denies fever or chills.  Continues with home health for Nsg and PT.  Followed up with Dr [name] via telemedicine.  No new complaints or concerns.

## 2020-07-06 NOTE — Telephone Encounter (Signed)
Rx for needles sent to Pharmacy.  She should not be on Trimethoprim and Macrodantin both.  I would have her stay on the [facility]

## 2020-07-13 ENCOUNTER — Encounter (INDEPENDENT_AMBULATORY_CARE_PROVIDER_SITE_OTHER): Payer: Self-pay | Admitting: FAMILY PRACTICE

## 2020-07-14 ENCOUNTER — Telehealth (INDEPENDENT_AMBULATORY_CARE_PROVIDER_SITE_OTHER): Payer: Self-pay | Admitting: FAMILY PRACTICE

## 2020-07-14 NOTE — Telephone Encounter (Signed)
Rejean from [facility] called to report that patient is noncompliant with home exercises and that when school starts back that there will be no one in the home to help patient. Pt. Is also in the process of a hospice referral and that patient will be discharged from PT [facility] in a couple of weeks.Michelene Heady, RN

## 2020-07-20 ENCOUNTER — Telehealth (INDEPENDENT_AMBULATORY_CARE_PROVIDER_SITE_OTHER): Payer: Self-pay | Admitting: FAMILY PRACTICE

## 2020-07-20 NOTE — Telephone Encounter (Signed)
I called and spoke with patients family regarding Dr Karmen Bongo orders.    Patient is to take only Macrodantin and is aware of    Med list updated    Lucretia Field, RN

## 2020-07-20 NOTE — Telephone Encounter (Signed)
I called patients family per Dr Karmen Bongo orders    It appears Dr mark [name] ordered [facility] for patient and Dr Jannifer [facility] ordered Trimethoprim    Dr. Elta Guadeloupe ordered that patient should not be on both and he would like for her to stay on the Macrodantin and stop the Trimethoprim    I called and spoke with patients care taker.  She said that Yes she was taking both.      I explained Dr Karmen Bongo orders to just take the [place] only and not the trimethoprim    She understood    medlist updated    Advised that she call with further questions when needed.  Patient has appt with Dr Elta Guadeloupe 08/07/2020    Lucretia Field, RN

## 2020-07-22 ENCOUNTER — Encounter (INDEPENDENT_AMBULATORY_CARE_PROVIDER_SITE_OTHER): Payer: Self-pay | Admitting: FAMILY PRACTICE

## 2020-07-30 ENCOUNTER — Other Ambulatory Visit (INDEPENDENT_AMBULATORY_CARE_PROVIDER_SITE_OTHER): Payer: Self-pay | Admitting: FAMILY PRACTICE

## 2020-07-30 ENCOUNTER — Encounter (INDEPENDENT_AMBULATORY_CARE_PROVIDER_SITE_OTHER): Payer: Self-pay | Admitting: FAMILY PRACTICE

## 2020-07-30 MED ORDER — OXYCODONE-ACETAMINOPHEN 7.5 MG-325 MG TABLET
1.00 | ORAL_TABLET | Freq: Four times a day (QID) | ORAL | 0 refills | Status: DC | PRN
Start: 2020-07-30 — End: 2020-08-26

## 2020-07-30 NOTE — Telephone Encounter (Signed)
[facility] was last reviewed on 07/30/2020 12:32 PM by Karmen Bongo L and result was REVIEWED PDMP.     Based on this, I have no concerns for prescribing controlled substances at this time.

## 2020-08-05 ENCOUNTER — Other Ambulatory Visit (INDEPENDENT_AMBULATORY_CARE_PROVIDER_SITE_OTHER): Payer: Self-pay | Admitting: FAMILY PRACTICE

## 2020-08-07 ENCOUNTER — Encounter (INDEPENDENT_AMBULATORY_CARE_PROVIDER_SITE_OTHER): Payer: Self-pay | Admitting: FAMILY PRACTICE

## 2020-08-07 ENCOUNTER — Other Ambulatory Visit: Payer: Self-pay

## 2020-08-07 ENCOUNTER — Ambulatory Visit: Payer: Medicare Other | Attending: FAMILY PRACTICE | Admitting: FAMILY PRACTICE

## 2020-08-07 DIAGNOSIS — K7460 Unspecified cirrhosis of liver: Secondary | ICD-10-CM | POA: Insufficient documentation

## 2020-08-07 DIAGNOSIS — I2699 Other pulmonary embolism without acute cor pulmonale: Secondary | ICD-10-CM | POA: Insufficient documentation

## 2020-08-07 DIAGNOSIS — R188 Other ascites: Secondary | ICD-10-CM | POA: Insufficient documentation

## 2020-08-07 DIAGNOSIS — I509 Heart failure, unspecified: Secondary | ICD-10-CM | POA: Insufficient documentation

## 2020-08-07 DIAGNOSIS — C259 Malignant neoplasm of pancreas, unspecified: Secondary | ICD-10-CM | POA: Insufficient documentation

## 2020-08-07 DIAGNOSIS — I4811 Longstanding persistent atrial fibrillation: Secondary | ICD-10-CM | POA: Insufficient documentation

## 2020-08-07 DIAGNOSIS — J449 Chronic obstructive pulmonary disease, unspecified: Secondary | ICD-10-CM | POA: Insufficient documentation

## 2020-08-07 DIAGNOSIS — E1142 Type 2 diabetes mellitus with diabetic polyneuropathy: Secondary | ICD-10-CM | POA: Insufficient documentation

## 2020-08-07 DIAGNOSIS — I110 Hypertensive heart disease with heart failure: Secondary | ICD-10-CM | POA: Insufficient documentation

## 2020-08-07 DIAGNOSIS — I10 Essential (primary) hypertension: Secondary | ICD-10-CM

## 2020-08-07 DIAGNOSIS — Z794 Long term (current) use of insulin: Secondary | ICD-10-CM | POA: Insufficient documentation

## 2020-08-07 MED ORDER — CALCIUM CARBONATE 600 MG-VITAMIN D3 20 MCG (800 UNIT) TABLET
ORAL_TABLET | ORAL | 1 refills | Status: DC
Start: 2020-08-07 — End: 2020-08-07

## 2020-08-07 MED ORDER — CALCIUM CARBONATE 600 MG-VITAMIN D3 20 MCG (800 UNIT) TABLET
ORAL_TABLET | ORAL | 1 refills | Status: DC
Start: 2020-08-07 — End: 2020-10-21

## 2020-08-07 NOTE — Assessment & Plan Note (Signed)
Fluid statis appears to be stable

## 2020-08-07 NOTE — Assessment & Plan Note (Signed)
No wheezing or shortness of breath today

## 2020-08-07 NOTE — Assessment & Plan Note (Signed)
BASIC METABOLIC PANEL  Lab Results   Component Value Date    SODIUM 134 (L) 06/14/2020    POTASSIUM 3.2 (L) 06/14/2020    CHLORIDE 101 06/14/2020    CO2 25 06/14/2020    ANIONGAP 8 06/14/2020    BUN 14 06/14/2020    CREATININE 1.10 (H) 06/14/2020    BUNCRRATIO 13 06/14/2020    GFR 50 (L) 06/14/2020    CALCIUM 7.8 (L) 06/14/2020    GLUCOSENF 132 (H) 06/14/2020      HEMOGLOBIN A1C   Date Value Ref Range Status   07/11/2019 10.3 (H) 4.0 - 6.0 % Final   04/26/2019 10.3 (H) 4.0 - 6.0 % Final   03/29/2018 7.2 (H) 4.0 - 6.0 % Final   10/29/2017 7.4 (H) 4.0 - 6.0 % Final     Patient has not been able to come to office.  Will try to get home  Health to draw labs

## 2020-08-07 NOTE — Assessment & Plan Note (Signed)
Rate controlled 

## 2020-08-07 NOTE — Patient Instructions (Signed)
Video visit in 2 months  Will get palliative to do blood work when they start seeing the patient.

## 2020-08-07 NOTE — Addendum Note (Signed)
Addended by: Myrene Buddy on: 08/07/2020 12:41 PM     Modules accepted: Orders

## 2020-08-07 NOTE — Assessment & Plan Note (Signed)
BP is doing well

## 2020-08-07 NOTE — Progress Notes (Signed)
Family Medicine  Progress Note    Date of Service:  08/07/2020  [name], [name], 74 y.o. female  Date of Admission:  (Not on file)  Date of Birth:  [birth year]  PCP: Myrene Buddy, MD    Patient's location: [place] [address]   Patient/family aware of provider location: Yes  Patient/family consent for video visit: Yes  Interview and observation performed by: Myrene Buddy, MD    HPI:  Patient is connecting for telemedicine visit.  Her daughter who is her care taker and the patient are both on the visit.  The patient states that she is doing well with no new complaints.  She denies chest pain, She is not short of breath today and is not having any significant pain during the visit.  She does not need any refills.  She is to start with palliative care.  She has end stage Pancreatic CA in addition to her other health issues, but appears to be stable for now.    No current facility-administered medications for this visit.       Allergies   Allergen Reactions    Sulfa (Sulfonamides) Itching             BP (Non-Invasive): (!) 140/70   Heart Rate: 82          There is no height or weight on file to calculate BMI.      Review of Systems   Constitutional: Negative for chills, fever, malaise/fatigue and weight loss.   Eyes: Negative for blurred vision and double vision.   Respiratory: Negative for cough, shortness of breath and wheezing.    Cardiovascular: Positive for leg swelling. Negative for chest pain, palpitations and orthopnea.   Gastrointestinal: Negative for abdominal pain, blood in stool, constipation, diarrhea, melena, nausea and vomiting.   Genitourinary: Negative for dysuria.   Musculoskeletal: Negative for joint pain.   Neurological: Negative for headaches.   Psychiatric/Behavioral: Negative for depression.     Acetaminophen-Butalbital 50-325 mg Oral Tablet, Take by mouth (Patient not taking: Reported on 06/09/2020)  aspirin (ECOTRIN) 81 mg Oral Tablet, Delayed Release (E.C.), Take 1 Tab (81 mg total) by  mouth Once a day  atorvastatin (LIPITOR) 10 mg Oral Tablet, TAKE 1 TABLET BY MOUTH ONCE DAILY  cetirizine (ZYRTEC) 10 mg Oral Tablet, Take 1 Tab (10 mg total) by mouth Once a day  CREON 12,000-38,000 -60,000 unit Oral Capsule, Delayed Release(E.C.), TAKE 2 CAPSULES BY MOUTH THREE TIMES DAILY WITH MEALS AND 1 CAPSULE WITH EACH SNACK  dilTIAZem (CARDIZEM) 30 mg Oral Tablet, Take 1 Tablet (30 mg total) by mouth Four times a day for 30 days (Patient taking differently: Take 60 mg by mouth 2X/day )  furosemide (LASIX) 80 mg Oral Tablet, Take 1.5 Tablets (120 mg total) by mouth Once a day for 90 days  hydrALAZINE (APRESOLINE) 25 mg Oral Tablet, Take 1 Tablet (25 mg total) by mouth Twice daily for 30 days  insulin aspart U-100 (NOVOLOG FLEXPEN U-100 INSULIN) 100 unit/mL (3 mL) Subcutaneous Insulin Pen, 7 Units by Subcutaneous route Twice a day before meals (Patient taking differently: by Subcutaneous route Twice a day before meals Per sliding scale)  LANTUS SOLOSTAR U-100 INSULIN 100 unit/mL (3 mL) Subcutaneous Insulin Pen, ADMINISTER 10 UNITS UNDER THE SKIN EVERY NIGHT  lidocaine HCL (XYLOCAINE) 2 % Mucous Membrane Solution, 5 mL  LORazepam (ATIVAN) 0.5 mg Oral Tablet, Take 1 Tablet (0.5 mg total) by mouth Every evening  losartan (COZAAR) 100 mg Oral Tablet,  Take 1 Tablet (100 mg total) by mouth Once a day for 90 days  metoprolol succinate (TOPROL-XL) 50 mg Oral Tablet Sustained Release 24 hr, Take 1 Tablet (50 mg total) by mouth Once a day  NANO PEN NEEDLE 32 gauge x 5/32" Needle, USE WITH INSULIN FLEXPEN BID AS NEEDED  nitrofurantoin (MACROBID) 100 mg Oral Capsule, Take 1 Capsule (100 mg total) by mouth Every night for 30 days  nitroGLYCERIN (NITROSTAT) 0.4 mg Sublingual Tablet, Sublingual, 1 Tablet (0.4 mg total) by Sublingual route Every 5 minutes as needed for Chest pain for 3 doses over 15 minutes (Patient not taking: Reported on 06/09/2020)  omeprazole (PRILOSEC) 40 mg Oral Capsule, Delayed Release(E.C.), Take 1  Capsule (40 mg total) by mouth Once a day  ondansetron (ZOFRAN) 4 mg Oral Tablet, Take 1 Tablet (4 mg total) by mouth Every 6 hours as needed for Nausea/Vomiting for up to 30 days  oxyCODONE-acetaminophen (PERCOCET) 7.5-325 mg Oral Tablet, Take 1 Tablet by mouth Every 6 hours as needed for Pain  sodium chloride 1 gram Oral Tablet, TAKE 1 TABLET(1 GRAM) BY MOUTH THREE TIMES DAILY WITH MEALS  tiotropium bromide (SPIRIVA HANDIHALER) 18 mcg Inhalation Capsule, w/Inhalation Device, Take 1 Capsule (18 mcg total) by inhalation Once a day for 180 days  calcium carbonate-vitamin D3 600 mg(1,500mg ) -800 unit Oral Tablet, Caltrate with Vitamin D3 600 mg (1,500 mg)-800 unit tablet   TK 1 T PO BID    No facility-administered medications prior to visit.     Acetaminophen-Butalbital 50-325 mg Oral Tablet Take by mouth (Patient not taking: Reported on 06/09/2020)    aspirin (ECOTRIN) 81 mg Oral Tablet, Delayed Release (E.C.) Take 1 Tab (81 mg total) by mouth Once a day    atorvastatin (LIPITOR) 10 mg Oral Tablet TAKE 1 TABLET BY MOUTH ONCE DAILY    calcium carbonate-vitamin D3 600 mg(1,500mg ) -800 unit Oral Tablet Caltrate with Vitamin D3 600 mg (1,500 mg)-800 unit tablet  1 tab PO BID    cetirizine (ZYRTEC) 10 mg Oral Tablet Take 1 Tab (10 mg total) by mouth Once a day    CREON 12,000-38,000 -60,000 unit Oral Capsule, Delayed Release(E.C.) TAKE 2 CAPSULES BY MOUTH THREE TIMES DAILY WITH MEALS AND 1 CAPSULE WITH EACH SNACK    dilTIAZem (CARDIZEM) 30 mg Oral Tablet Take 1 Tablet (30 mg total) by mouth Four times a day for 30 days (Patient taking differently: Take 60 mg by mouth 2X/day )    furosemide (LASIX) 80 mg Oral Tablet Take 1.5 Tablets (120 mg total) by mouth Once a day for 90 days    hydrALAZINE (APRESOLINE) 25 mg Oral Tablet Take 1 Tablet (25 mg total) by mouth Twice daily for 30 days    insulin aspart U-100 (NOVOLOG FLEXPEN U-100 INSULIN) 100 unit/mL (3 mL) Subcutaneous Insulin Pen 7 Units by Subcutaneous route  Twice a day before meals (Patient taking differently: by Subcutaneous route Twice a day before meals Per sliding scale)    LANTUS SOLOSTAR U-100 INSULIN 100 unit/mL (3 mL) Subcutaneous Insulin Pen ADMINISTER 10 UNITS UNDER THE SKIN EVERY NIGHT    lidocaine HCL (XYLOCAINE) 2 % Mucous Membrane Solution 5 mL    LORazepam (ATIVAN) 0.5 mg Oral Tablet Take 1 Tablet (0.5 mg total) by mouth Every evening    losartan (COZAAR) 100 mg Oral Tablet Take 1 Tablet (100 mg total) by mouth Once a day for 90 days    metoprolol succinate (TOPROL-XL) 50 mg Oral Tablet Sustained Release 24 hr Take 1  Tablet (50 mg total) by mouth Once a day    NANO PEN NEEDLE 32 gauge x 5/32" Needle USE WITH INSULIN FLEXPEN BID AS NEEDED    nitrofurantoin (MACROBID) 100 mg Oral Capsule Take 1 Capsule (100 mg total) by mouth Every night for 30 days    nitroGLYCERIN (NITROSTAT) 0.4 mg Sublingual Tablet, Sublingual 1 Tablet (0.4 mg total) by Sublingual route Every 5 minutes as needed for Chest pain for 3 doses over 15 minutes (Patient not taking: Reported on 06/09/2020)    omeprazole (PRILOSEC) 40 mg Oral Capsule, Delayed Release(E.C.) Take 1 Capsule (40 mg total) by mouth Once a day    ondansetron (ZOFRAN) 4 mg Oral Tablet Take 1 Tablet (4 mg total) by mouth Every 6 hours as needed for Nausea/Vomiting for up to 30 days    oxyCODONE-acetaminophen (PERCOCET) 7.5-325 mg Oral Tablet Take 1 Tablet by mouth Every 6 hours as needed for Pain    sodium chloride 1 gram Oral Tablet TAKE 1 TABLET(1 GRAM) BY MOUTH THREE TIMES DAILY WITH MEALS    tiotropium bromide (SPIRIVA HANDIHALER) 18 mcg Inhalation Capsule, w/Inhalation Device Take 1 Capsule (18 mcg total) by inhalation Once a day for 180 days         Observational exam  No distress, oriented x 3,  Breathing normal, smiling, no dyspnea, talking and hearing without issues.  Has edema in both legs.  No swelling in her eyes.  No jaundice noted in her sclera.      Laboratory Data:     Imaging Studies:    No  orders to display       Assessment/Plan:  Problem List Items Addressed This Visit        Unprioritized    Pancreatic cancer (CMS HCC) (Chronic)     End stage, but appears stable         Type 2 diabetes mellitus with diabetic polyneuropathy, with long-term current use of insulin (CMS HCC) (Chronic)     BASIC METABOLIC PANEL  Lab Results   Component Value Date    SODIUM 134 (L) 06/14/2020    POTASSIUM 3.2 (L) 06/14/2020    CHLORIDE 101 06/14/2020    CO2 25 06/14/2020    ANIONGAP 8 06/14/2020    BUN 14 06/14/2020    CREATININE 1.10 (H) 06/14/2020    BUNCRRATIO 13 06/14/2020    GFR 50 (L) 06/14/2020    CALCIUM 7.8 (L) 06/14/2020    GLUCOSENF 132 (H) 06/14/2020      HEMOGLOBIN A1C   Date Value Ref Range Status   07/11/2019 10.3 (H) 4.0 - 6.0 % Final   04/26/2019 10.3 (H) 4.0 - 6.0 % Final   03/29/2018 7.2 (H) 4.0 - 6.0 % Final   10/29/2017 7.4 (H) 4.0 - 6.0 % Final     Patient has not been able to come to office.  Will try to get home  Health to draw labs         PE (pulmonary thromboembolism) (CMS HCC) (Chronic)     No shortness of breath         COPD (chronic obstructive pulmonary disease) (CMS HCC) (Chronic)     No wheezing or shortness of breath today         Essential hypertension (Chronic)     BP is doing well         CHF (congestive heart failure) (CMS HCC) (Chronic)     Appears to be stable at this time  Longstanding persistent atrial fibrillation (CMS HCC) (Chronic)     Rate controlled         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)     Fluid statis appears to be stable               Number and Complexity of Problems Addressed  2 or more stable chronic illnesses  1 acute, uncomplicated illness or injury    Amount and/or Complexity of Data to be Reviewed and Analyzed  3+ review of the results of each unique test    Risk of Complications and/or Morbidity or Mortality of Patient Management  Low                This note was partially created using voice recognition software and is inherently subject to  errors including those of syntax and "sound alike " substitutions which may escape proof reading.  In such instances, original meaning may be extrapolated by contextual derivation.    Myrene Buddy, MD

## 2020-08-07 NOTE — Assessment & Plan Note (Signed)
End stage, but appears stable

## 2020-08-07 NOTE — Assessment & Plan Note (Signed)
No shortness of breath

## 2020-08-07 NOTE — Nursing Note (Signed)
Telephone visit call at 10:38am.  Pt requesting refill in caltrate with vitamin d3. No new concerns or problems. She did clip her toe nail, but her granddaughter states it is healing very well.  Marcie Bal, LPN

## 2020-08-07 NOTE — Assessment & Plan Note (Signed)
-  Appears to be stable at this time  °  °

## 2020-08-13 ENCOUNTER — Other Ambulatory Visit (INDEPENDENT_AMBULATORY_CARE_PROVIDER_SITE_OTHER): Payer: Self-pay | Admitting: FAMILY PRACTICE

## 2020-08-13 MED ORDER — NITROFURANTOIN MONOHYDRATE/MACROCRYSTALS 100 MG CAPSULE
100.00 mg | ORAL_CAPSULE | Freq: Every evening | ORAL | 3 refills | Status: DC
Start: 2020-08-13 — End: 2020-09-24

## 2020-08-25 ENCOUNTER — Other Ambulatory Visit (INDEPENDENT_AMBULATORY_CARE_PROVIDER_SITE_OTHER): Payer: Self-pay | Admitting: FAMILY PRACTICE

## 2020-08-26 ENCOUNTER — Other Ambulatory Visit (INDEPENDENT_AMBULATORY_CARE_PROVIDER_SITE_OTHER): Payer: Self-pay | Admitting: FAMILY MEDICINE

## 2020-08-26 ENCOUNTER — Other Ambulatory Visit (INDEPENDENT_AMBULATORY_CARE_PROVIDER_SITE_OTHER): Payer: Self-pay | Admitting: FAMILY PRACTICE

## 2020-08-26 ENCOUNTER — Encounter (INDEPENDENT_AMBULATORY_CARE_PROVIDER_SITE_OTHER): Payer: Self-pay | Admitting: FAMILY PRACTICE

## 2020-08-26 MED ORDER — FLUTICASONE PROPIONATE 50 MCG/ACTUATION NASAL SPRAY,SUSPENSION
1.00 | Freq: Every day | NASAL | 5 refills | Status: DC
Start: 2020-08-26 — End: 2020-10-08

## 2020-08-27 MED ORDER — CETIRIZINE 10 MG TABLET
10.00 mg | ORAL_TABLET | Freq: Every day | ORAL | 5 refills | Status: DC
Start: 2020-08-27 — End: 2020-11-18

## 2020-08-27 MED ORDER — ASPIRIN 81 MG TABLET,DELAYED RELEASE
81.00 mg | DELAYED_RELEASE_TABLET | Freq: Every day | ORAL | 5 refills | Status: DC
Start: 2020-08-27 — End: 2020-11-18

## 2020-08-27 MED ORDER — OXYCODONE-ACETAMINOPHEN 7.5 MG-325 MG TABLET
1.00 | ORAL_TABLET | Freq: Four times a day (QID) | ORAL | 0 refills | Status: DC | PRN
Start: 2020-08-27 — End: 2020-09-24

## 2020-08-27 NOTE — Telephone Encounter (Signed)
[facility] was last reviewed on 08/27/2020  8:26 AM by Karmen Bongo L and result was REVIEWED PDMP.     Based on this, I have no concerns for prescribing controlled substances at this time.    This is a cancer patient

## 2020-08-27 NOTE — Telephone Encounter (Signed)
I couldn't find this on med list. Michelene Heady, RN

## 2020-09-24 ENCOUNTER — Other Ambulatory Visit (INDEPENDENT_AMBULATORY_CARE_PROVIDER_SITE_OTHER): Payer: Self-pay | Admitting: FAMILY PRACTICE

## 2020-09-24 MED ORDER — OXYCODONE-ACETAMINOPHEN 7.5 MG-325 MG TABLET
1.0000 | ORAL_TABLET | Freq: Four times a day (QID) | ORAL | 0 refills | Status: DC | PRN
Start: 2020-09-24 — End: 2020-10-21

## 2020-09-24 MED ORDER — INSULIN ASPART (U-100) 100 UNIT/ML (3 ML) SUBCUTANEOUS PEN
7.00 [IU] | PEN_INJECTOR | Freq: Two times a day (BID) | SUBCUTANEOUS | 3 refills | Status: DC
Start: 2020-09-24 — End: 2020-12-16

## 2020-09-24 MED ORDER — LOSARTAN 100 MG TABLET
100.00 mg | ORAL_TABLET | Freq: Every day | ORAL | 0 refills | Status: DC
Start: 2020-09-24 — End: 2020-12-16

## 2020-09-24 MED ORDER — LORAZEPAM 0.5 MG TABLET
0.50 mg | ORAL_TABLET | Freq: Every evening | ORAL | 2 refills | Status: DC
Start: 2020-09-24 — End: 2020-10-21

## 2020-09-24 MED ORDER — FUROSEMIDE 80 MG TABLET
120.00 mg | ORAL_TABLET | Freq: Every day | ORAL | 0 refills | Status: DC
Start: 2020-09-24 — End: 2020-12-16

## 2020-09-24 MED ORDER — NITROFURANTOIN MONOHYDRATE/MACROCRYSTALS 100 MG CAPSULE
100.00 mg | ORAL_CAPSULE | Freq: Every evening | ORAL | 3 refills | Status: DC
Start: 2020-09-24 — End: 2020-12-16

## 2020-09-24 NOTE — Telephone Encounter (Signed)
[facility] was last reviewed on 09/24/2020  9:36 AM by Karmen Bongo L and result was REVIEWED PDMP.     Based on this, I have no concerns for prescribing controlled substances at this time.

## 2020-10-08 ENCOUNTER — Ambulatory Visit: Payer: Medicare Other | Attending: FAMILY PRACTICE | Admitting: FAMILY PRACTICE

## 2020-10-08 ENCOUNTER — Encounter (INDEPENDENT_AMBULATORY_CARE_PROVIDER_SITE_OTHER): Payer: Self-pay | Admitting: FAMILY PRACTICE

## 2020-10-08 ENCOUNTER — Other Ambulatory Visit: Payer: Self-pay

## 2020-10-08 DIAGNOSIS — F325 Major depressive disorder, single episode, in full remission: Secondary | ICD-10-CM | POA: Insufficient documentation

## 2020-10-08 DIAGNOSIS — J449 Chronic obstructive pulmonary disease, unspecified: Secondary | ICD-10-CM | POA: Insufficient documentation

## 2020-10-08 DIAGNOSIS — I10 Essential (primary) hypertension: Secondary | ICD-10-CM

## 2020-10-08 DIAGNOSIS — Z794 Long term (current) use of insulin: Secondary | ICD-10-CM | POA: Insufficient documentation

## 2020-10-08 DIAGNOSIS — I110 Hypertensive heart disease with heart failure: Secondary | ICD-10-CM | POA: Insufficient documentation

## 2020-10-08 DIAGNOSIS — E222 Syndrome of inappropriate secretion of antidiuretic hormone: Secondary | ICD-10-CM | POA: Insufficient documentation

## 2020-10-08 DIAGNOSIS — I509 Heart failure, unspecified: Secondary | ICD-10-CM | POA: Insufficient documentation

## 2020-10-08 DIAGNOSIS — E876 Hypokalemia: Secondary | ICD-10-CM | POA: Insufficient documentation

## 2020-10-08 DIAGNOSIS — E1142 Type 2 diabetes mellitus with diabetic polyneuropathy: Secondary | ICD-10-CM | POA: Insufficient documentation

## 2020-10-08 DIAGNOSIS — E871 Hypo-osmolality and hyponatremia: Secondary | ICD-10-CM

## 2020-10-08 DIAGNOSIS — C259 Malignant neoplasm of pancreas, unspecified: Secondary | ICD-10-CM | POA: Insufficient documentation

## 2020-10-08 MED ORDER — FLUTICASONE PROPIONATE 50 MCG/ACTUATION NASAL SPRAY,SUSPENSION
1.0000 | Freq: Every day | NASAL | 5 refills | Status: AC
Start: 2020-10-08 — End: 2020-11-07

## 2020-10-08 NOTE — Assessment & Plan Note (Signed)
Will come in for labs

## 2020-10-08 NOTE — Assessment & Plan Note (Signed)
Daughter in law states that BP at home is doing OK

## 2020-10-08 NOTE — Nursing Note (Signed)
Pt telephone triage completed. No new complaints voiced. Pt has not received covid or flu vaccine. Pt needs several refills. BE

## 2020-10-08 NOTE — Progress Notes (Signed)
Family Medicine  Progress Note    Date of Service:  10/08/2020  [name], [name], 74 y.o. female  Date of Admission:  (Not on file)  Date of Birth:  1946-11-29  PCP: Myrene Buddy, MD    Patient's location: [place] [address]   Patient/family aware of provider location: Yes  Patient/family consent for video visit: Yes  Interview and observation performed by: Myrene Buddy, MD    HPI:  Is having a telemedicine visit to check up on her status.  Her weight appears to be down 10lbs in the last 2 weeks, but her edema is less which is better and may be the reason.  Her pain is controlled.  She denies chest pain, Nausea above baseline or vomiting.  She states that she has some abdominal pain that is controled with the pain meds.  She states that her bowels are moving and unchanged.  No cough, no dysuria.    She has not had any labs for awhile.  They will come in and have some out patient labs next week.  No fever.        No current facility-administered medications for this visit.       Allergies   Allergen Reactions    Sulfa (Sulfonamides) Itching             BP (Non-Invasive): (!) 143/62 Temperature: 36.3 C (97.3 F) Heart Rate: 69 Respiratory Rate: 17 SpO2: 97 %  Height: 154.9 cm (5\' 1" )   Body mass index is 30.61 kg/m.      Review of Systems   Constitutional: Negative for chills, fever, malaise/fatigue and weight loss.   Eyes: Negative for blurred vision and double vision.   Respiratory: Negative for cough, shortness of breath and wheezing.    Cardiovascular: Negative for chest pain, palpitations, orthopnea and leg swelling.   Gastrointestinal: Negative for abdominal pain, blood in stool, constipation, diarrhea, melena, nausea and vomiting.   Genitourinary: Negative for dysuria.   Musculoskeletal: Negative for joint pain.   Neurological: Negative for headaches.   Psychiatric/Behavioral: Negative for depression.       Observational exam   Patient is alert, oriented, breathing easy in no acute distress.  No  pain at this time.  She is smiling.  Her skin color appears good.  She has bilateral pedal edema.      Laboratory Data:       Imaging Studies:    No orders to display       Assessment/Plan:  Problem List Items Addressed This Visit        Unprioritized    CHF (congestive heart failure) (CMS HCC) (Chronic)     She has chronic edema in her ankles which is better than it was 2 weeks ago and her weight is down 2lbs.         COPD (chronic obstructive pulmonary disease) (CMS HCC) (Chronic)     Symptoms appear to be stable         Essential hypertension (Chronic)     Daughter in law states that BP at home is doing OK         Hypokalemia (Chronic)     Patient is going to come in or blood work next week         Hyponatremia (Chronic)     Patient is going to come in or blood work next week         Major depressive disorder, single episode, in full remission (CMS [place])  Appears to be controled at this time.         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)     Will come in for labs         Type 2 diabetes mellitus with diabetic polyneuropathy, with long-term current use of insulin (CMS HCC) (Chronic)     BS at home have been good               Number and Complexity of Problems Addressed  2 or more stable chronic illnesses  1 acute, uncomplicated illness or injury    Amount and/or Complexity of Data to be Reviewed and Analyzed  3+ unique tests ordered    Risk of Complications and/or Morbidity or Mortality of Patient Management  Low              RTC in 3 months    This note was partially created using voice recognition software and is inherently subject to errors including those of syntax and "sound alike " substitutions which may escape proof reading.  In such instances, original meaning may be extrapolated by  contextual derivation.    Myrene Buddy, MD

## 2020-10-08 NOTE — Assessment & Plan Note (Signed)
Symptoms appear to be stable

## 2020-10-08 NOTE — Assessment & Plan Note (Signed)
She has chronic edema in her ankles which is better than it was 2 weeks ago and her weight is down 2lbs.

## 2020-10-08 NOTE — Assessment & Plan Note (Signed)
Patient is going to come in or blood work next week

## 2020-10-08 NOTE — Assessment & Plan Note (Signed)
BS at home have been good

## 2020-10-08 NOTE — Patient Instructions (Signed)
Get labs done in next few weeks  Appointment in 3 months

## 2020-10-08 NOTE — Assessment & Plan Note (Signed)
Patient has pancreatic cancer that oncology states that there is no more treatment for, but the patient appears to be stable at this time

## 2020-10-08 NOTE — Assessment & Plan Note (Signed)
Appears to be controled at this time.

## 2020-10-15 ENCOUNTER — Other Ambulatory Visit: Payer: Self-pay

## 2020-10-15 ENCOUNTER — Ambulatory Visit: Payer: Medicare Other | Attending: FAMILY PRACTICE

## 2020-10-15 ENCOUNTER — Telehealth (INDEPENDENT_AMBULATORY_CARE_PROVIDER_SITE_OTHER): Payer: Self-pay | Admitting: FAMILY PRACTICE

## 2020-10-15 DIAGNOSIS — Z794 Long term (current) use of insulin: Secondary | ICD-10-CM | POA: Insufficient documentation

## 2020-10-15 DIAGNOSIS — C259 Malignant neoplasm of pancreas, unspecified: Secondary | ICD-10-CM | POA: Insufficient documentation

## 2020-10-15 DIAGNOSIS — I509 Heart failure, unspecified: Secondary | ICD-10-CM | POA: Insufficient documentation

## 2020-10-15 DIAGNOSIS — J449 Chronic obstructive pulmonary disease, unspecified: Secondary | ICD-10-CM

## 2020-10-15 DIAGNOSIS — E871 Hypo-osmolality and hyponatremia: Secondary | ICD-10-CM

## 2020-10-15 DIAGNOSIS — E222 Syndrome of inappropriate secretion of antidiuretic hormone: Secondary | ICD-10-CM | POA: Insufficient documentation

## 2020-10-15 DIAGNOSIS — I10 Essential (primary) hypertension: Secondary | ICD-10-CM

## 2020-10-15 DIAGNOSIS — E119 Type 2 diabetes mellitus without complications: Secondary | ICD-10-CM | POA: Insufficient documentation

## 2020-10-15 DIAGNOSIS — E1142 Type 2 diabetes mellitus with diabetic polyneuropathy: Secondary | ICD-10-CM | POA: Insufficient documentation

## 2020-10-15 DIAGNOSIS — F325 Major depressive disorder, single episode, in full remission: Secondary | ICD-10-CM | POA: Insufficient documentation

## 2020-10-15 DIAGNOSIS — E876 Hypokalemia: Secondary | ICD-10-CM

## 2020-10-15 LAB — CBC WITH DIFF
BASOPHIL #: 0.1 10*3/uL (ref <=0.20)
BASOPHIL %: 2 %
EOSINOPHIL #: 0.19 10*3/uL (ref <=0.50)
EOSINOPHIL %: 3 %
HCT: 31.7 % — ABNORMAL LOW (ref 34.8–46.0)
HGB: 10.1 g/dL — ABNORMAL LOW (ref 11.5–16.0)
IMMATURE GRANULOCYTE #: <0.1 10*3/uL (ref <0.10)
IMMATURE GRANULOCYTE %: 1 % (ref 0–1)
LYMPHOCYTE #: 1.1 10*3/uL (ref 1.00–4.80)
LYMPHOCYTE %: 17 %
MCH: 28.2 pg (ref 26.0–32.0)
MCHC: 31.9 g/dL (ref 31.0–35.5)
MCV: 88.5 fL (ref 78.0–100.0)
MONOCYTE #: 0.56 10*3/uL (ref 0.20–1.10)
MONOCYTE %: 9 %
MPV: 12.2 fL (ref 8.7–12.5)
NEUTROPHIL #: 4.58 10*3/uL (ref 1.50–7.70)
NEUTROPHIL %: 68 %
PLATELETS: 127 10*3/uL — ABNORMAL LOW (ref 150–400)
RBC: 3.58 10*6/uL — ABNORMAL LOW (ref 3.85–5.22)
RDW-CV: 13.2 % (ref 11.5–15.5)
WBC: 6.6 10*3/uL (ref 3.7–11.0)

## 2020-10-15 LAB — COMPREHENSIVE METABOLIC PNL, FASTING
ALBUMIN: 2.7 g/dL — ABNORMAL LOW (ref 3.4–4.8)
ALKALINE PHOSPHATASE: 116 U/L (ref 55–145)
ALT (SGPT): 17 U/L (ref 8–22)
ANION GAP: 10 mmol/L (ref 4–13)
AST (SGOT): 18 U/L (ref 8–45)
BILIRUBIN TOTAL: 0.5 mg/dL (ref 0.3–1.3)
BUN/CREA RATIO: 29 — ABNORMAL HIGH (ref 6–22)
BUN: 33 mg/dL — ABNORMAL HIGH (ref 8–25)
CALCIUM: 8.7 mg/dL — ABNORMAL LOW (ref 8.8–10.2)
CHLORIDE: 98 mmol/L (ref 96–111)
CO2 TOTAL: 25 mmol/L (ref 23–31)
CREATININE: 1.15 mg/dL — ABNORMAL HIGH (ref 0.60–1.05)
ESTIMATED GFR: 48 mL/min/BSA — ABNORMAL LOW (ref >=60)
GLUCOSE: 286 mg/dL — ABNORMAL HIGH (ref 70–99)
POTASSIUM: 4.5 mmol/L (ref 3.5–5.1)
PROTEIN TOTAL: 5.1 g/dL — ABNORMAL LOW (ref 6.0–8.0)
SODIUM: 133 mmol/L — ABNORMAL LOW (ref 136–145)

## 2020-10-15 LAB — HGA1C (HEMOGLOBIN A1C WITH EST AVG GLUCOSE)
ESTIMATED AVERAGE GLUCOSE: 171 mg/dL
HEMOGLOBIN A1C: 7.6 % — ABNORMAL HIGH (ref 4.0–6.0)

## 2020-10-15 LAB — THYROID STIMULATING HORMONE (SENSITIVE TSH): TSH: 2.218 u[IU]/mL (ref 0.430–3.550)

## 2020-10-15 NOTE — Telephone Encounter (Signed)
Orders per Dr Karmen Bongo     I called patient and had to leave message regarding her last set of labs done    Patient/Family should call back with questions    Lucretia Field, RN

## 2020-10-15 NOTE — Telephone Encounter (Signed)
-----   Message from Myrene Buddy, MD sent at 10/15/2020  2:11 PM EDT -----  Call and tell patient test results show that HgbA1C is much better and where we want it to be.

## 2020-10-15 NOTE — Progress Notes (Signed)
Please call the patient tell from the test results were abnormal but stable.  K+ is OK.  Will not recommend change in any meds at this time base on her lab results.  drmark

## 2020-10-15 NOTE — Telephone Encounter (Signed)
-----   Message from Myrene Buddy, MD sent at 10/15/2020 12:37 PM EDT -----  Please call the patient tell from the test results were abnormal stable to mildly improved, drmark

## 2020-10-15 NOTE — Progress Notes (Signed)
Please call the patient tell from the test results were abnormal stable to mildly improved, drmark

## 2020-10-15 NOTE — Telephone Encounter (Signed)
-----   Message from Myrene Buddy, MD sent at 10/15/2020  1:06 PM EDT -----  Please call the patient tell from the test results were abnormal but stable.  K+ is OK.  Will not recommend change in any meds at this time base on her lab results.  drmark

## 2020-10-15 NOTE — Telephone Encounter (Signed)
I called cell number given with no answers  And unable to leave a message at all    Will await their return call or try at a later time today    All lab tests are ok per Dr Elta Guadeloupe Rhona Leavens, RN

## 2020-10-15 NOTE — Telephone Encounter (Signed)
Patient notified thru "[facility]" with the results per Dr Karmen Bongo orders.    Stanton Kidney said she is very happy to hear the good news and it is nice to have this phone call    She will call back with further questions if needed.    Lucretia Field, RN

## 2020-10-15 NOTE — Telephone Encounter (Signed)
Patient notified of lab results with no new orders at this time and no changes    Patient/family will call with questions    Lucretia Field, RN

## 2020-10-15 NOTE — Nursing Note (Signed)
Labs drawn

## 2020-10-15 NOTE — Progress Notes (Signed)
Call and tell patient test results show that HgbA1C is much better and where we want it to be.

## 2020-10-16 ENCOUNTER — Other Ambulatory Visit (INDEPENDENT_AMBULATORY_CARE_PROVIDER_SITE_OTHER): Payer: Self-pay | Admitting: FAMILY PRACTICE

## 2020-10-16 NOTE — Telephone Encounter (Signed)
Notified pt caregiver that zyrtec had been sent previously and just needs picked up at pharmacy and started. Was informed that medication was picked up yesterday and started. BE

## 2020-10-21 ENCOUNTER — Other Ambulatory Visit (INDEPENDENT_AMBULATORY_CARE_PROVIDER_SITE_OTHER): Payer: Self-pay | Admitting: FAMILY PRACTICE

## 2020-10-21 MED ORDER — SODIUM CHLORIDE 1 GRAM TABLET
1.0000 g | ORAL_TABLET | Freq: Three times a day (TID) | ORAL | 1 refills | Status: AC
Start: 2020-10-21 — End: 2021-01-19

## 2020-10-21 MED ORDER — OXYCODONE-ACETAMINOPHEN 7.5 MG-325 MG TABLET
1.0000 | ORAL_TABLET | Freq: Four times a day (QID) | ORAL | 0 refills | Status: DC | PRN
Start: 2020-10-21 — End: 2020-11-18

## 2020-10-21 MED ORDER — CREON 12,000-38,000-60,000 UNIT CAPSULE,DELAYED RELEASE
2.0000 | DELAYED_RELEASE_CAPSULE | Freq: Three times a day (TID) | ORAL | 1 refills | Status: AC
Start: 2020-10-21 — End: 2021-01-19

## 2020-10-21 MED ORDER — OMEPRAZOLE 40 MG CAPSULE,DELAYED RELEASE
40.0000 mg | DELAYED_RELEASE_CAPSULE | Freq: Every day | ORAL | 4 refills | Status: AC
Start: 2020-10-21 — End: ?

## 2020-10-21 MED ORDER — BD ULTRA-FINE NANO PEN NEEDLE 32 GAUGE X 5/32"
5 refills | Status: DC
Start: 2020-10-21 — End: 2020-12-16

## 2020-10-21 MED ORDER — CALCIUM 600 MG (AS CARBONATE)-VITAMIN D3 20 MCG (800 UNIT) TABLET
ORAL_TABLET | ORAL | 1 refills | Status: AC
Start: 2020-10-21 — End: ?

## 2020-10-21 MED ORDER — LORAZEPAM 0.5 MG TABLET
0.5000 mg | ORAL_TABLET | Freq: Every evening | ORAL | 2 refills | Status: DC
Start: 2020-10-21 — End: 2020-11-18

## 2020-10-21 NOTE — Telephone Encounter (Signed)
[facility] was last reviewed on 10/21/2020  1:19 PM by Karmen Bongo L and result was REVIEWED PDMP.     Based on this, I have no concerns for prescribing controlled substances at this time.

## 2020-11-03 ENCOUNTER — Other Ambulatory Visit (INDEPENDENT_AMBULATORY_CARE_PROVIDER_SITE_OTHER): Payer: Self-pay | Admitting: Family Medicine

## 2020-11-18 ENCOUNTER — Other Ambulatory Visit (INDEPENDENT_AMBULATORY_CARE_PROVIDER_SITE_OTHER): Payer: Self-pay | Admitting: FAMILY PRACTICE

## 2020-11-18 DIAGNOSIS — I509 Heart failure, unspecified: Secondary | ICD-10-CM

## 2020-11-18 DIAGNOSIS — I10 Essential (primary) hypertension: Secondary | ICD-10-CM

## 2020-11-18 DIAGNOSIS — E119 Type 2 diabetes mellitus without complications: Secondary | ICD-10-CM

## 2020-11-18 MED ORDER — CETIRIZINE 10 MG TABLET
10.0000 mg | ORAL_TABLET | Freq: Every day | ORAL | 5 refills | Status: AC
Start: 2020-11-18 — End: ?

## 2020-11-18 MED ORDER — ASPIRIN 81 MG TABLET,DELAYED RELEASE
81.0000 mg | DELAYED_RELEASE_TABLET | Freq: Every day | ORAL | 5 refills | Status: AC
Start: 2020-11-18 — End: ?

## 2020-11-18 MED ORDER — LORAZEPAM 0.5 MG TABLET
0.5000 mg | ORAL_TABLET | Freq: Every evening | ORAL | 2 refills | Status: DC
Start: 2020-11-18 — End: 2020-12-17

## 2020-11-18 MED ORDER — OXYCODONE-ACETAMINOPHEN 7.5 MG-325 MG TABLET
1.0000 | ORAL_TABLET | Freq: Four times a day (QID) | ORAL | 0 refills | Status: DC | PRN
Start: 2020-11-18 — End: 2020-12-17

## 2020-11-18 MED ORDER — LANTUS SOLOSTAR U-100 INSULIN 100 UNIT/ML (3 ML) SUBCUTANEOUS PEN
10.0000 [IU] | PEN_INJECTOR | Freq: Every evening | SUBCUTANEOUS | 11 refills | Status: AC
Start: 2020-11-18 — End: ?

## 2020-11-18 MED ORDER — HYDRALAZINE 25 MG TABLET
25.0000 mg | ORAL_TABLET | Freq: Two times a day (BID) | ORAL | 5 refills | Status: AC
Start: 2020-11-18 — End: 2020-12-18

## 2020-11-18 NOTE — Telephone Encounter (Signed)
Duplicate

## 2020-11-18 NOTE — Telephone Encounter (Signed)
[facility] was last reviewed on 11/18/2020 11:57 AM by Karmen Bongo L and result was REVIEWED PDMP.     Based on this, I have no concerns for prescribing controlled substances at this time.

## 2020-12-16 ENCOUNTER — Encounter (INDEPENDENT_AMBULATORY_CARE_PROVIDER_SITE_OTHER): Payer: Self-pay | Admitting: FAMILY PRACTICE

## 2020-12-16 ENCOUNTER — Other Ambulatory Visit (INDEPENDENT_AMBULATORY_CARE_PROVIDER_SITE_OTHER): Payer: Self-pay | Admitting: FAMILY PRACTICE

## 2020-12-16 MED ORDER — FUROSEMIDE 80 MG TABLET
120.0000 mg | ORAL_TABLET | Freq: Every day | ORAL | 0 refills | Status: AC
Start: 2020-12-16 — End: 2021-03-16

## 2020-12-16 MED ORDER — NITROFURANTOIN MONOHYDRATE/MACROCRYSTALS 100 MG CAPSULE
100.0000 mg | ORAL_CAPSULE | Freq: Every evening | ORAL | 1 refills | Status: AC
Start: 2020-12-16 — End: 2021-01-15

## 2020-12-16 MED ORDER — LOSARTAN 100 MG TABLET
100.0000 mg | ORAL_TABLET | Freq: Every day | ORAL | 0 refills | Status: AC
Start: 2020-12-16 — End: 2021-03-16

## 2020-12-16 MED ORDER — DILTIAZEM 30 MG TABLET
30.0000 mg | ORAL_TABLET | Freq: Four times a day (QID) | ORAL | 1 refills | Status: DC
Start: 2020-12-16 — End: 2020-12-17

## 2020-12-17 ENCOUNTER — Telehealth (INDEPENDENT_AMBULATORY_CARE_PROVIDER_SITE_OTHER): Payer: Self-pay | Admitting: FAMILY PRACTICE

## 2020-12-17 ENCOUNTER — Other Ambulatory Visit (INDEPENDENT_AMBULATORY_CARE_PROVIDER_SITE_OTHER): Payer: Self-pay | Admitting: FAMILY MEDICINE

## 2020-12-17 DIAGNOSIS — I4811 Longstanding persistent atrial fibrillation: Secondary | ICD-10-CM

## 2020-12-17 MED ORDER — BD ULTRA-FINE NANO PEN NEEDLE 32 GAUGE X 5/32"
5 refills | Status: AC
Start: 2020-12-17 — End: ?

## 2020-12-17 MED ORDER — DILTIAZEM CD 120 MG CAPSULE,EXTENDED RELEASE 24 HR
120.0000 mg | ORAL_CAPSULE | Freq: Every day | ORAL | 3 refills | Status: AC
Start: 2020-12-17 — End: ?

## 2020-12-17 MED ORDER — OXYCODONE-ACETAMINOPHEN 7.5 MG-325 MG TABLET
1.0000 | ORAL_TABLET | Freq: Four times a day (QID) | ORAL | 0 refills | Status: DC | PRN
Start: 2020-12-17 — End: 2021-01-14

## 2020-12-17 MED ORDER — LORAZEPAM 0.5 MG TABLET
0.5000 mg | ORAL_TABLET | Freq: Every evening | ORAL | 2 refills | Status: DC
Start: 2020-12-17 — End: 2021-01-14

## 2020-12-17 MED ORDER — INSULIN ASPART (U-100) 100 UNIT/ML (3 ML) SUBCUTANEOUS PEN
7.0000 [IU] | PEN_INJECTOR | Freq: Two times a day (BID) | SUBCUTANEOUS | 3 refills | Status: AC
Start: 2020-12-17 — End: ?

## 2020-12-17 NOTE — Telephone Encounter (Signed)
Changed to long-acting Cardizem, new e-rx to pharmacy on file.

## 2020-12-17 NOTE — Telephone Encounter (Signed)
Pt's family wanted me to ask is the directions could be changed on her Novolog because she is using it HSS instead of 7 units bid.  Thanks.Michelene Heady, RN

## 2020-12-17 NOTE — Telephone Encounter (Signed)
Not sure that matters for the rx as long as she has enough to cover per month and the Rx was sent in today, those directions can be changed at the next refill.  drmark

## 2020-12-17 NOTE — Telephone Encounter (Signed)
[facility] was last reviewed on 12/17/2020  5:48 AM by Karmen Bongo L and result was REVIEWED PDMP.     Based on this, I have no concerns for prescribing controlled substances at this time.

## 2020-12-17 NOTE — Telephone Encounter (Signed)
I will refill this medication but wondering if patient would like to be on the longer-acting kind that she can take just once daily? It would be Cardizem CD 120mg  daily.

## 2020-12-17 NOTE — Telephone Encounter (Signed)
Pt. Would like the Cardizem 120mg  daily.Michelene Heady, RN

## 2020-12-28 NOTE — Telephone Encounter (Signed)
Left message on voicemail.Gustavo Dispenza, RN

## 2021-01-11 ENCOUNTER — Ambulatory Visit: Payer: Medicare Other | Attending: FAMILY PRACTICE | Admitting: FAMILY PRACTICE

## 2021-01-11 ENCOUNTER — Telehealth (INDEPENDENT_AMBULATORY_CARE_PROVIDER_SITE_OTHER): Payer: Self-pay | Admitting: FAMILY PRACTICE

## 2021-01-11 ENCOUNTER — Encounter (INDEPENDENT_AMBULATORY_CARE_PROVIDER_SITE_OTHER): Payer: Self-pay | Admitting: FAMILY PRACTICE

## 2021-01-11 DIAGNOSIS — E222 Syndrome of inappropriate secretion of antidiuretic hormone: Secondary | ICD-10-CM | POA: Insufficient documentation

## 2021-01-11 DIAGNOSIS — E440 Moderate protein-calorie malnutrition: Secondary | ICD-10-CM | POA: Insufficient documentation

## 2021-01-11 DIAGNOSIS — L89152 Pressure ulcer of sacral region, stage 2: Secondary | ICD-10-CM | POA: Insufficient documentation

## 2021-01-11 DIAGNOSIS — D696 Thrombocytopenia, unspecified: Secondary | ICD-10-CM | POA: Insufficient documentation

## 2021-01-11 DIAGNOSIS — K7460 Unspecified cirrhosis of liver: Secondary | ICD-10-CM | POA: Insufficient documentation

## 2021-01-11 DIAGNOSIS — F325 Major depressive disorder, single episode, in full remission: Secondary | ICD-10-CM | POA: Insufficient documentation

## 2021-01-11 DIAGNOSIS — I2699 Other pulmonary embolism without acute cor pulmonale: Secondary | ICD-10-CM | POA: Insufficient documentation

## 2021-01-11 DIAGNOSIS — I10 Essential (primary) hypertension: Secondary | ICD-10-CM | POA: Insufficient documentation

## 2021-01-11 DIAGNOSIS — J449 Chronic obstructive pulmonary disease, unspecified: Secondary | ICD-10-CM | POA: Insufficient documentation

## 2021-01-11 DIAGNOSIS — C7802 Secondary malignant neoplasm of left lung: Secondary | ICD-10-CM | POA: Insufficient documentation

## 2021-01-11 DIAGNOSIS — Z794 Long term (current) use of insulin: Secondary | ICD-10-CM | POA: Insufficient documentation

## 2021-01-11 DIAGNOSIS — I4811 Longstanding persistent atrial fibrillation: Secondary | ICD-10-CM | POA: Insufficient documentation

## 2021-01-11 DIAGNOSIS — I509 Heart failure, unspecified: Secondary | ICD-10-CM | POA: Insufficient documentation

## 2021-01-11 DIAGNOSIS — E1142 Type 2 diabetes mellitus with diabetic polyneuropathy: Secondary | ICD-10-CM | POA: Insufficient documentation

## 2021-01-11 DIAGNOSIS — E785 Hyperlipidemia, unspecified: Secondary | ICD-10-CM

## 2021-01-11 NOTE — Telephone Encounter (Signed)
-----   Message from Myrene Buddy, MD sent at 01/11/2021  9:16 AM EST -----  Home health with [facility].  She has a stage 2 decubiti on her coccyx about 1 inch in size.  Have them also do CMP, HgbA1C and CBC when they go in. I have placed orders in the chart

## 2021-01-11 NOTE — Assessment & Plan Note (Signed)
BP is good today

## 2021-01-11 NOTE — Nursing Note (Signed)
telemed visit, overall wellness visit.  Reports poor appetite,  Check abdomen hernia, says its gotten bigger.  No refills at this visit. cn

## 2021-01-11 NOTE — Telephone Encounter (Signed)
Spoke with [name] at [place] [facility], pt was discharged back in Aug. 2021.  Pt will need to be a new referral.  Informed her pt has stage 2 decubiti, and need labs drawn.cn

## 2021-01-11 NOTE — Assessment & Plan Note (Signed)
1 inch stage 2 on coccyx  Will arrange for home health to come in

## 2021-01-11 NOTE — Assessment & Plan Note (Signed)
Controlled rate today

## 2021-01-11 NOTE — Progress Notes (Signed)
Family Medicine  Progress Note    Date of Service:  01/11/2021  [name], [name], 75 y.o. female  Date of Admission:  (Not on file)  Date of Birth:  1946/06/10  PCP: Myrene Buddy, MD    Patient's location: [place] [address]   Patient/family aware of provider location: Yes  Patient/family consent for video visit: Yes  Interview and observation performed by: Myrene Buddy, MD    HPI:  Is a regular patient of mine with multiple health problems as noted in the [place].  She has a new stage 2 decubiti in the coccyx area according to her LPN caretaker.  I will have home health go in and evaluate her and do some labs as well.   She does not need labs. She is still having dependant leg edema.  Her appetite is poor.   She has an enlarging ventral hernia that is soft and not tender.      No current facility-administered medications for this visit.       Allergies   Allergen Reactions    Sulfa (Sulfonamides) Itching             BP (Non-Invasive): 137/68 Temperature: 36.6 C (97.9 F) Heart Rate: 75 Respiratory Rate: 18 SpO2: 92 %  Height: 157.5 cm (5\' 2" ) Weight: 88.5 kg (195 lb 3.2 oz) Body mass index is 35.7 kg/m.      Review of Systems   Constitutional: Positive for malaise/fatigue and weight loss.        Poor appetite   Cardiovascular: Positive for leg swelling.   Gastrointestinal:        Hernia worsening   Genitourinary:        Incontinent   Neurological: Positive for weakness.       Observational exam patient is reclining in recliner.  NO pain.  Tired appearing.  No respiratory distress.  +leg edema seen and large ventral hernia that is not tender and soft.  She is alert.      Laboratory Data:     Imaging Studies:    No orders to display       Assessment/Plan:  Problem List Items Addressed This Visit        Unprioritized    CHF (congestive heart failure) (CMS HCC) (Chronic)     Chronic dependant leg edema.          Relevant Orders    Refer to [place] - EXTERNAL    CBC/DIFF    COMPREHENSIVE METABOLIC PNL,  FASTING    HGA1C (HEMOGLOBIN A1C WITH EST AVG GLUCOSE)    COPD (chronic obstructive pulmonary disease) (CMS HCC) (Chronic)     Stable today with sat of 96%         Relevant Orders    Refer to [place]    CBC/DIFF    COMPREHENSIVE METABOLIC PNL, FASTING    GUR4Y (HEMOGLOBIN A1C WITH EST AVG GLUCOSE)    Essential hypertension (Chronic)     BP is good today         Relevant Orders    Refer to [place]    CBC/DIFF    COMPREHENSIVE METABOLIC PNL, FASTING    HCW2B (HEMOGLOBIN A1C WITH EST AVG GLUCOSE)    Hyperlipidemia (Chronic)     Lab Results   Component Value Date    CHOLESTEROL 106 04/01/2020    HDLCHOL 59 04/01/2020    LDLCHOL 35 04/01/2020    TRIG 61 04/01/2020  Relevant Orders    Refer to [place]    CBC/DIFF    COMPREHENSIVE METABOLIC PNL, FASTING    GUY4I (HEMOGLOBIN A1C WITH EST AVG GLUCOSE)    Longstanding persistent atrial fibrillation (CMS HCC) (Chronic)     Controlled rate today         Relevant Orders    Refer to [place]    CBC/DIFF    COMPREHENSIVE METABOLIC PNL, FASTING    HKV4Q (HEMOGLOBIN A1C WITH EST AVG GLUCOSE)    Major depressive disorder, single episode, in full remission (CMS HCC)    Relevant Orders    Refer to [place]    CBC/DIFF    COMPREHENSIVE METABOLIC PNL, FASTING    VZD6L (HEMOGLOBIN A1C WITH EST AVG GLUCOSE)    Moderate protein-calorie malnutrition (CMS HCC)    Relevant Orders    Refer to [place]    CBC/DIFF    COMPREHENSIVE METABOLIC PNL, FASTING    OVF6E (HEMOGLOBIN A1C WITH EST AVG GLUCOSE)    Morbid (severe) obesity due to excess calories (CMS HCC)    Relevant Orders    Refer to [place]    CBC/DIFF    COMPREHENSIVE METABOLIC PNL, FASTING    PPI9J (HEMOGLOBIN A1C WITH EST AVG GLUCOSE)    RESOLVED: PE (pulmonary thromboembolism) (CMS HCC) (Chronic)    Relevant Orders    Refer to [place]    CBC/DIFF    COMPREHENSIVE METABOLIC PNL, FASTING    JOA4Z (HEMOGLOBIN  A1C WITH EST AVG GLUCOSE)    Pressure ulcer of sacral region, stage 2 (CMS HCC)     1 inch stage 2 on coccyx  Will arrange for home health to come in         Relevant Orders    Refer to [place] - EXTERNAL    CBC/DIFF    COMPREHENSIVE METABOLIC PNL, FASTING    YSA6T (HEMOGLOBIN A1C WITH EST AVG GLUCOSE)    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)    Relevant Orders    Refer to Home Health - EXTERNAL    CBC/DIFF    COMPREHENSIVE METABOLIC PNL, FASTING    KZS0F (HEMOGLOBIN A1C WITH EST AVG GLUCOSE)    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)     BASIC METABOLIC PANEL  Lab Results   Component Value Date    SODIUM 133 (L) 10/15/2020    POTASSIUM 4.5 10/15/2020    CHLORIDE 98 10/15/2020    CO2 25 10/15/2020    ANIONGAP 10 10/15/2020    BUN 33 (H) 10/15/2020    CREATININE 1.15 (H) 10/15/2020    BUNCRRATIO 29 (H) 10/15/2020    GFR 48 (L) 10/15/2020    CALCIUM 8.7 (L) 10/15/2020    GLUCOSENF 286 (H) 10/15/2020               Relevant Orders    Refer to [place] - EXTERNAL    CBC/DIFF    COMPREHENSIVE METABOLIC PNL, FASTING    UXN2T (HEMOGLOBIN A1C WITH EST AVG GLUCOSE)    Thrombocytopenia, unspecified (CMS HCC)    Relevant Orders    Refer to [place] - EXTERNAL    CBC/DIFF    COMPREHENSIVE METABOLIC PNL, FASTING    FTD3U (HEMOGLOBIN A1C WITH EST AVG GLUCOSE)    Type 2 diabetes mellitus with diabetic polyneuropathy, with long-term current use of insulin (CMS HCC) (Chronic)  Lab Results   Component Value Date    HA1C 7.6 (H) 10/15/2020              Relevant Orders    Refer to [place] - EXTERNAL    CBC/DIFF    COMPREHENSIVE METABOLIC PNL, FASTING    123456 (HEMOGLOBIN A1C WITH EST AVG GLUCOSE)      Other Visit Diagnoses     Unspecified cirrhosis of liver (CMS HCC)        Relevant Orders    Refer to [place]    CBC/DIFF    COMPREHENSIVE METABOLIC PNL, FASTING    123456 (HEMOGLOBIN A1C WITH EST AVG GLUCOSE)          Number and Complexity of Problems Addressed  2  or more stable chronic illnesses  1 acute, uncomplicated illness or injury    Amount and/or Complexity of Data to be Reviewed and Analyzed  3+ unique tests ordered              Will arrange home health to see patient.      This note was partially created using voice recognition software and is inherently subject to errors including those of syntax and "sound alike " substitutions which may escape proof reading.  In such instances, original meaning may be extrapolated by contextual derivation.    Myrene Buddy, MD

## 2021-01-11 NOTE — Assessment & Plan Note (Signed)
Stable today with sat of 96%

## 2021-01-11 NOTE — Assessment & Plan Note (Signed)
Lab Results   Component Value Date    HA1C 7.6 (H) 10/15/2020

## 2021-01-11 NOTE — Patient Instructions (Signed)
Arrange home health  Visit again in 2 months

## 2021-01-11 NOTE — Assessment & Plan Note (Signed)
BASIC METABOLIC PANEL  Lab Results   Component Value Date    SODIUM 133 (L) 10/15/2020    POTASSIUM 4.5 10/15/2020    CHLORIDE 98 10/15/2020    CO2 25 10/15/2020    ANIONGAP 10 10/15/2020    BUN 33 (H) 10/15/2020    CREATININE 1.15 (H) 10/15/2020    BUNCRRATIO 29 (H) 10/15/2020    GFR 48 (L) 10/15/2020    CALCIUM 8.7 (L) 10/15/2020    GLUCOSENF 286 (H) 10/15/2020

## 2021-01-11 NOTE — Assessment & Plan Note (Signed)
Chronic dependant leg edema.

## 2021-01-11 NOTE — Assessment & Plan Note (Signed)
Lab Results   Component Value Date    CHOLESTEROL 106 04/01/2020    HDLCHOL 59 04/01/2020    LDLCHOL 35 04/01/2020    TRIG 61 04/01/2020

## 2021-01-12 ENCOUNTER — Telehealth (INDEPENDENT_AMBULATORY_CARE_PROVIDER_SITE_OTHER): Payer: Self-pay | Admitting: FAMILY PRACTICE

## 2021-01-12 DIAGNOSIS — R188 Other ascites: Secondary | ICD-10-CM

## 2021-01-12 DIAGNOSIS — E1142 Type 2 diabetes mellitus with diabetic polyneuropathy: Secondary | ICD-10-CM

## 2021-01-12 DIAGNOSIS — I4811 Longstanding persistent atrial fibrillation: Secondary | ICD-10-CM

## 2021-01-12 DIAGNOSIS — I509 Heart failure, unspecified: Secondary | ICD-10-CM

## 2021-01-12 DIAGNOSIS — C7802 Secondary malignant neoplasm of left lung: Secondary | ICD-10-CM

## 2021-01-12 DIAGNOSIS — E222 Syndrome of inappropriate secretion of antidiuretic hormone: Secondary | ICD-10-CM

## 2021-01-12 DIAGNOSIS — C259 Malignant neoplasm of pancreas, unspecified: Secondary | ICD-10-CM

## 2021-01-12 DIAGNOSIS — Z794 Long term (current) use of insulin: Secondary | ICD-10-CM

## 2021-01-12 DIAGNOSIS — K7460 Unspecified cirrhosis of liver: Secondary | ICD-10-CM

## 2021-01-12 DIAGNOSIS — Z66 Do not resuscitate: Secondary | ICD-10-CM

## 2021-01-12 DIAGNOSIS — J449 Chronic obstructive pulmonary disease, unspecified: Secondary | ICD-10-CM

## 2021-01-12 NOTE — Telephone Encounter (Signed)
Patients family, Jensyn Shave called    She stated that [name] is not doing very well at this time.  She is not eating much.  What she is able to eat, she started vomiting it up now.    She said she is able to tolerate some fluids, one bottle a water and one protein drink a day. But is having a harder time to tolerate even this    They are now asking for Hospice referral  They would like to discuss admission into a hospice house somewhere    [facility] asked if Dr Karmen Bongo has time to call her at [phone]    Lucretia Field, RN

## 2021-01-12 NOTE — Telephone Encounter (Signed)
Hospice referral placed and Granddaughter notified of this.  She states that she would prefer to have have her in a hospice house.  I told her that I will place the referral and they can discuss the needs and coordinate her care.

## 2021-01-14 ENCOUNTER — Encounter (INDEPENDENT_AMBULATORY_CARE_PROVIDER_SITE_OTHER): Payer: Self-pay | Admitting: FAMILY PRACTICE

## 2021-01-14 ENCOUNTER — Telehealth (INDEPENDENT_AMBULATORY_CARE_PROVIDER_SITE_OTHER): Payer: Self-pay | Admitting: FAMILY PRACTICE

## 2021-01-14 ENCOUNTER — Other Ambulatory Visit (INDEPENDENT_AMBULATORY_CARE_PROVIDER_SITE_OTHER): Payer: Self-pay | Admitting: FAMILY PRACTICE

## 2021-01-14 MED ORDER — LORAZEPAM 0.5 MG TABLET
0.5000 mg | ORAL_TABLET | Freq: Every evening | ORAL | 2 refills | Status: AC
Start: 2021-01-14 — End: ?

## 2021-01-14 MED ORDER — OXYCODONE-ACETAMINOPHEN 7.5 MG-325 MG TABLET
1.0000 | ORAL_TABLET | Freq: Four times a day (QID) | ORAL | 0 refills | Status: AC | PRN
Start: 2021-01-14 — End: ?

## 2021-01-14 NOTE — Telephone Encounter (Signed)
[facility] was last reviewed on 01/14/2021 10:49 AM by Karmen Bongo L and result was REVIEWED PDMP.     Based on this, I have no concerns for prescribing controlled substances at this time.

## 2021-01-14 NOTE — Telephone Encounter (Signed)
I believe that 4% lidocaine is OTC

## 2021-01-14 NOTE — Telephone Encounter (Signed)
Patient caregiver informed  Thamas Jaegers, MA

## 2021-01-14 NOTE — Telephone Encounter (Signed)
Patient caregiver, Stanton Kidney, called to see if she could get some Lidocaine for patient bottom.  She has a 2 cm decub.  Send to [facility], [place]

## 2021-02-09 DEATH — deceased

## 2023-02-14 NOTE — Progress Notes (Signed)
The patient did not appear for their appointment/or scheduled appointment was cancelled.  This office visit opened in error.
